# Patient Record
Sex: Female | Born: 1945 | Race: Black or African American | Hispanic: No | Smoking: Former smoker
Health system: Southern US, Community
[De-identification: ages and names within clinical notes are randomized; demographics above are authoritative.]

## PROBLEM LIST (undated history)

## (undated) ENCOUNTER — Emergency Department (HOSPITAL_COMMUNITY): Admission: EM | Disposition: A | Discharge status: Other Acute Inpatient Hospital | Payer: Medicare Other

## (undated) DIAGNOSIS — I10 Essential (primary) hypertension: Secondary | ICD-10-CM

## (undated) DIAGNOSIS — K219 Gastro-esophageal reflux disease without esophagitis: Secondary | ICD-10-CM

## (undated) DIAGNOSIS — K449 Diaphragmatic hernia without obstruction or gangrene: Secondary | ICD-10-CM

## (undated) DIAGNOSIS — N186 End stage renal disease: Secondary | ICD-10-CM

## (undated) DIAGNOSIS — G8929 Other chronic pain: Secondary | ICD-10-CM

## (undated) DIAGNOSIS — J189 Pneumonia, unspecified organism: Secondary | ICD-10-CM

## (undated) DIAGNOSIS — M545 Low back pain, unspecified: Secondary | ICD-10-CM

## (undated) DIAGNOSIS — I2510 Atherosclerotic heart disease of native coronary artery without angina pectoris: Secondary | ICD-10-CM

## (undated) DIAGNOSIS — I4819 Other persistent atrial fibrillation: Secondary | ICD-10-CM

## (undated) DIAGNOSIS — E119 Type 2 diabetes mellitus without complications: Secondary | ICD-10-CM

## (undated) DIAGNOSIS — Z992 Dependence on renal dialysis: Secondary | ICD-10-CM

## (undated) DIAGNOSIS — K5790 Diverticulosis of intestine, part unspecified, without perforation or abscess without bleeding: Secondary | ICD-10-CM

## (undated) DIAGNOSIS — M1990 Unspecified osteoarthritis, unspecified site: Secondary | ICD-10-CM

## (undated) DIAGNOSIS — Z9289 Personal history of other medical treatment: Secondary | ICD-10-CM

## (undated) DIAGNOSIS — E669 Obesity, unspecified: Secondary | ICD-10-CM

## (undated) DIAGNOSIS — E785 Hyperlipidemia, unspecified: Secondary | ICD-10-CM

## (undated) DIAGNOSIS — N289 Disorder of kidney and ureter, unspecified: Secondary | ICD-10-CM

## (undated) HISTORY — PX: AV FISTULA REPAIR: SHX563

## (undated) HISTORY — PX: ABDOMINAL HYSTERECTOMY: SHX81

## (undated) HISTORY — DX: Atherosclerotic heart disease of native coronary artery without angina pectoris: I25.10

## (undated) HISTORY — DX: Morbid (severe) obesity due to excess calories: E66.01

## (undated) HISTORY — PX: COLONOSCOPY W/ BIOPSIES AND POLYPECTOMY: SHX1376

## (undated) HISTORY — PX: ESOPHAGOGASTRODUODENOSCOPY: SHX1529

## (undated) HISTORY — DX: Diaphragmatic hernia without obstruction or gangrene: K44.9

## (undated) HISTORY — PX: CATARACT EXTRACTION W/ INTRAOCULAR LENS IMPLANT: SHX1309

## (undated) HISTORY — PX: CARDIAC CATHETERIZATION: SHX172

## (undated) HISTORY — DX: Diverticulosis of intestine, part unspecified, without perforation or abscess without bleeding: K57.90

---

## 1998-03-10 ENCOUNTER — Other Ambulatory Visit: Admission: RE | Admit: 1998-03-10 | Discharge: 1998-03-10 | Payer: Self-pay | Admitting: *Deleted

## 1998-06-23 ENCOUNTER — Other Ambulatory Visit: Admission: RE | Admit: 1998-06-23 | Discharge: 1998-06-23 | Payer: Self-pay | Admitting: Internal Medicine

## 1999-03-18 ENCOUNTER — Encounter: Payer: Self-pay | Admitting: Emergency Medicine

## 1999-03-18 ENCOUNTER — Emergency Department (HOSPITAL_COMMUNITY): Admission: EM | Admit: 1999-03-18 | Discharge: 1999-03-18 | Payer: Self-pay | Admitting: Emergency Medicine

## 1999-06-08 ENCOUNTER — Ambulatory Visit (HOSPITAL_COMMUNITY): Admission: RE | Admit: 1999-06-08 | Discharge: 1999-06-08 | Payer: Self-pay | Admitting: Internal Medicine

## 1999-06-08 ENCOUNTER — Encounter: Payer: Self-pay | Admitting: Internal Medicine

## 1999-06-12 ENCOUNTER — Emergency Department (HOSPITAL_COMMUNITY): Admission: EM | Admit: 1999-06-12 | Discharge: 1999-06-12 | Payer: Self-pay | Admitting: Emergency Medicine

## 2000-03-13 ENCOUNTER — Emergency Department (HOSPITAL_COMMUNITY): Admission: EM | Admit: 2000-03-13 | Discharge: 2000-03-13 | Payer: Self-pay | Admitting: Emergency Medicine

## 2000-03-13 ENCOUNTER — Encounter: Payer: Self-pay | Admitting: Emergency Medicine

## 2000-06-23 ENCOUNTER — Ambulatory Visit (HOSPITAL_COMMUNITY): Admission: RE | Admit: 2000-06-23 | Discharge: 2000-06-23 | Payer: Self-pay | Admitting: Internal Medicine

## 2000-06-23 ENCOUNTER — Encounter: Payer: Self-pay | Admitting: Internal Medicine

## 2000-08-03 ENCOUNTER — Emergency Department (HOSPITAL_COMMUNITY): Admission: EM | Admit: 2000-08-03 | Discharge: 2000-08-03 | Payer: Self-pay | Admitting: Emergency Medicine

## 2000-08-03 ENCOUNTER — Encounter: Payer: Self-pay | Admitting: Emergency Medicine

## 2000-08-07 ENCOUNTER — Encounter: Payer: Self-pay | Admitting: Internal Medicine

## 2000-08-07 ENCOUNTER — Ambulatory Visit (HOSPITAL_COMMUNITY): Admission: RE | Admit: 2000-08-07 | Discharge: 2000-08-07 | Payer: Self-pay | Admitting: Internal Medicine

## 2000-08-13 ENCOUNTER — Encounter: Payer: Self-pay | Admitting: Emergency Medicine

## 2000-08-13 ENCOUNTER — Emergency Department (HOSPITAL_COMMUNITY): Admission: EM | Admit: 2000-08-13 | Discharge: 2000-08-13 | Payer: Self-pay | Admitting: Emergency Medicine

## 2000-08-18 ENCOUNTER — Inpatient Hospital Stay (HOSPITAL_COMMUNITY): Admission: AD | Admit: 2000-08-18 | Discharge: 2000-08-20 | Payer: Self-pay | Admitting: Internal Medicine

## 2001-07-14 ENCOUNTER — Emergency Department (HOSPITAL_COMMUNITY): Admission: EM | Admit: 2001-07-14 | Discharge: 2001-07-14 | Payer: Self-pay | Admitting: *Deleted

## 2001-07-14 ENCOUNTER — Encounter: Payer: Self-pay | Admitting: *Deleted

## 2002-04-15 ENCOUNTER — Emergency Department (HOSPITAL_COMMUNITY): Admission: EM | Admit: 2002-04-15 | Discharge: 2002-04-15 | Payer: Self-pay | Admitting: Emergency Medicine

## 2002-06-16 ENCOUNTER — Encounter: Payer: Self-pay | Admitting: Internal Medicine

## 2002-06-16 ENCOUNTER — Ambulatory Visit (HOSPITAL_COMMUNITY): Admission: RE | Admit: 2002-06-16 | Discharge: 2002-06-16 | Payer: Self-pay | Admitting: Internal Medicine

## 2002-08-27 ENCOUNTER — Encounter (INDEPENDENT_AMBULATORY_CARE_PROVIDER_SITE_OTHER): Payer: Self-pay | Admitting: Specialist

## 2002-08-27 ENCOUNTER — Ambulatory Visit (HOSPITAL_COMMUNITY): Admission: RE | Admit: 2002-08-27 | Discharge: 2002-08-27 | Payer: Self-pay | Admitting: Gastroenterology

## 2003-05-08 ENCOUNTER — Emergency Department (HOSPITAL_COMMUNITY): Admission: EM | Admit: 2003-05-08 | Discharge: 2003-05-08 | Payer: Self-pay | Admitting: Emergency Medicine

## 2003-06-20 ENCOUNTER — Encounter: Payer: Self-pay | Admitting: Internal Medicine

## 2003-06-20 ENCOUNTER — Ambulatory Visit (HOSPITAL_COMMUNITY): Admission: RE | Admit: 2003-06-20 | Discharge: 2003-06-20 | Payer: Self-pay | Admitting: Internal Medicine

## 2005-05-02 ENCOUNTER — Ambulatory Visit (HOSPITAL_COMMUNITY): Admission: RE | Admit: 2005-05-02 | Discharge: 2005-05-02 | Payer: Self-pay | Admitting: Internal Medicine

## 2005-06-01 ENCOUNTER — Emergency Department (HOSPITAL_COMMUNITY): Admission: EM | Admit: 2005-06-01 | Discharge: 2005-06-01 | Payer: Self-pay | Admitting: Emergency Medicine

## 2006-04-13 ENCOUNTER — Emergency Department (HOSPITAL_COMMUNITY): Admission: EM | Admit: 2006-04-13 | Discharge: 2006-04-13 | Payer: Self-pay | Admitting: Emergency Medicine

## 2006-05-05 ENCOUNTER — Ambulatory Visit (HOSPITAL_COMMUNITY): Admission: RE | Admit: 2006-05-05 | Discharge: 2006-05-05 | Payer: Self-pay | Admitting: Internal Medicine

## 2006-05-08 ENCOUNTER — Ambulatory Visit (HOSPITAL_COMMUNITY): Admission: RE | Admit: 2006-05-08 | Discharge: 2006-05-09 | Payer: Self-pay | Admitting: Ophthalmology

## 2007-05-20 ENCOUNTER — Ambulatory Visit (HOSPITAL_COMMUNITY): Admission: RE | Admit: 2007-05-20 | Discharge: 2007-05-20 | Payer: Self-pay | Admitting: Internal Medicine

## 2007-10-07 ENCOUNTER — Emergency Department (HOSPITAL_COMMUNITY): Admission: EM | Admit: 2007-10-07 | Discharge: 2007-10-07 | Payer: Self-pay | Admitting: Emergency Medicine

## 2007-11-03 ENCOUNTER — Emergency Department (HOSPITAL_COMMUNITY): Admission: EM | Admit: 2007-11-03 | Discharge: 2007-11-03 | Payer: Self-pay | Admitting: Internal Medicine

## 2008-02-17 ENCOUNTER — Emergency Department (HOSPITAL_COMMUNITY): Admission: EM | Admit: 2008-02-17 | Discharge: 2008-02-18 | Payer: Self-pay | Admitting: Emergency Medicine

## 2008-05-03 ENCOUNTER — Ambulatory Visit (HOSPITAL_COMMUNITY): Admission: RE | Admit: 2008-05-03 | Discharge: 2008-05-04 | Payer: Self-pay | Admitting: Ophthalmology

## 2008-05-24 ENCOUNTER — Ambulatory Visit (HOSPITAL_COMMUNITY): Admission: RE | Admit: 2008-05-24 | Discharge: 2008-05-24 | Payer: Self-pay | Admitting: Internal Medicine

## 2009-05-24 ENCOUNTER — Emergency Department (HOSPITAL_COMMUNITY): Admission: EM | Admit: 2009-05-24 | Discharge: 2009-05-24 | Payer: Self-pay | Admitting: Emergency Medicine

## 2009-05-26 ENCOUNTER — Emergency Department (HOSPITAL_COMMUNITY): Admission: EM | Admit: 2009-05-26 | Discharge: 2009-05-26 | Payer: Self-pay | Admitting: Emergency Medicine

## 2011-03-11 LAB — POCT CARDIAC MARKERS
CKMB, poc: <1 ng/mL — ABNORMAL LOW (ref 1.0–8.0)
Myoglobin, poc: 166 ng/mL (ref 12–200)
Troponin i, poc: <0.05 ng/mL (ref 0.00–0.09)

## 2011-03-11 LAB — POCT I-STAT, CHEM 8
BUN: 29 mg/dL — ABNORMAL HIGH (ref 6–23)
Chloride: 104 mEq/L (ref 96–112)
Potassium: 4.4 mEq/L (ref 3.5–5.1)

## 2011-04-16 NOTE — Op Note (Signed)
[name], Jacqueline             ACCOUNT NO.:  [id number]   MEDICAL RECORD NO.:  [id number]          PATIENT TYPE:  OIB   LOCATION:  [id number]                         FACILITY:  [facility]   PHYSICIAN:  Chrystie Nose. Zigmund Daniel, M.D. DATE OF BIRTH:  1946/05/05   DATE OF PROCEDURE:  05/03/2008  DATE OF DISCHARGE:                               OPERATIVE REPORT   ADMISSION DIAGNOSES:  1. Retained lens material in the left eye.  2. Aphakia, left eye.   PROCEDURES:  Pars plana vitrectomy, phacofragmentation of nuclear  material and vitreous, placement of secondary intraocular lens with  suture, and retinal photocoagulation, left eye.   SURGEON:  Chrystie Nose. Zigmund Daniel, MD   ASSISTANT:  Deatra Ina, MA   ANESTHESIA:  General.   PROCEDURE IN DETAIL:  Usual prep and drape, peritomy from 8 o'clock to 4  o'clock, half-thickness scleral flaps were raised at 3 o'clock and 9  o'clock in anticipation of IOL suture.  A 19-gauge wound was placed at 2  o'clock for phacofragmentation device.  The 25-gauge trocars were placed  at 10, 2, and 4 o'clock.  A 3-layered corneoscleral wound was created  between 10 o'clock and 2 o'clock.  Provisc was placed on the corneal  surface.  Pars plana vitrectomy was carried out to remove vitreous and  lens fragments from the vitreous cavity.  A large dark yellow nucleus  was encountered.  The phacofragmentation device was placed through the  scleral wound and phacofragmentation was performed with the lighted pick  and the phacofragmentation device.  The entire nucleus was removed.  All  nuclear material was removed.  Additional vitrectomy was carried out  removing vitreous and the pigmented debris from the vitreous.  Some  light clear shiny particles were removed from the retinal surface.  Mini  shiny particles were attached to the retinal surface and were not able  to be removed.  The silicone tip suction line was also used to try to  remove these particles.  Two 10-0 Prolene  sutures were placed beneath  the flaps behind the iris through the ciliary sulcus from 3 o'clock to 9  o'clock.  The sutures were externalized through the corneoscleral wound.  A new intraocular lens model [id number] made [place], length 12.5 MM, optic 7.0 MM, serial number [id number],  expiration date 2012-05 was brought onto the field.  This lens was  inspected and cleaned.  Sutures were attached to the eyelets of the  lens.  The lens was passed into the posterior chamber and dialed into  place.  These scleral sutures were knotted externally beneath the  scleral flaps.  The corneal wound was closed with 4 interrupted 10-0  nylon sutures.  The Fragmatome wound was closed with 9-0 nylon.  Additional vitrectomy was performed removing pigmented particles from  the retinal surface.  Once the entire vitreous cavity was clear, the 25-  gauge trocars were removed from the eye.  The wounds were tested and  found to be tight.  The conjunctiva was closed with 7-0 chromic suture.  Polymyxin and gentamicin were irrigated into the Tenon  space.  Marcaine was injected around the globe for postop pain.  Decadron 10 mg  was injected into the lower subconjunctival space.  The closing pressure  was 10 with a Barraquer tonometer.  TobraDex ophthalmic ointment and  patch and shield were placed.  The patient was awakened and taken to  recovery in satisfactory condition.      Chrystie Nose. Zigmund Daniel, M.D.  Electronically Signed     JDM/MEDQ  D:  05/03/2008  T:  05/04/2008  Job:  [id number]

## 2011-04-19 NOTE — Op Note (Signed)
NAME:  Jacqueline [name], Jacqueline [name]                       ACCOUNT NO.:  [id number]   MEDICAL RECORD NO.:  [id number]                   PATIENT TYPE:  AMB   LOCATION:  ENDO                                 FACILITY:  [facility]   PHYSICIAN:  Juanita Craver, M.D.                   DATE OF BIRTH:  1946/05/03   DATE OF PROCEDURE:  08/27/2002  DATE OF DISCHARGE:                                 OPERATIVE REPORT   PROCEDURE:  Colonoscopy with snare polypectomy x 1.   ENDOSCOPIST:  Juanita Craver, M.D.   INSTRUMENT USED:  Olympus video colonoscope.   INDICATIONS FOR PROCEDURE:  A 65 year old African-American female underwent  colonoscopy to rule out colonic polyps, massive hemorrhoids.  The patient  has a history of anemia with hemoglobin down to 10.1.   PREPROCEDURE PREPARATION:  Informed consent was procured from the patient.  The patient was fasted for eight hours prior to the procedure and prepped  with a bottle of MiraLax and 64 ounces of GoLYTELY the night prior to the  procedure.   PREPROCEDURE PHYSICAL:  VITAL SIGNS:  The patient had stable vital signs.  High blood pressure at 225/108.  NECK:  Supple.  CHEST:  Clear to auscultation.  CARDIAC:  S1 and S2 regular.  ABDOMEN:  Soft with normal bowel sounds.   Ten mg of labetalol was given to the patient prior to procedure to help with  her blood pressure.   DESCRIPTION OF PROCEDURE:  The patient was placed in the left lateral  decubitus position and sedated with and additional 10 mg of Demerol and 1 mg  of Versed intravenously.  Once the patient was adequately sedated,  maintained on low flow oxygen and continuous cardiac monitoring, the Olympus  video colonoscope was advanced through the rectum to the cecum with extreme  difficulty.  There was large amount of solid stool and patchy areas  throughout the colon.  Multiple washes were done.  The appendiceal orifice  and ileocecal valve were visualized and photographed.  Small lesions could  have  been missed.  No large masses or polyps were present.  Small sessile  polyp was snared from 100 cm in the transverse colon.  There was scattered  diverticulosis throughout the colon with inspissated stool in several  diverticular pockets.   IMPRESSION:  1. Scattered diverticulosis.  2. Small sessile polyp snared at 100 cm.  3. Large amount of residual stool in the colon.  Small lesions could have     been missed.   RECOMMENDATIONS:  1. Await pathology results.  2. Avoid all nonsteroidals including aspirin.  3.     Follow up with Dr. Willey Blade within the next two to three weeks for     discussion of antihypertensive treatment regimen.  4. Outpatient in the next two weeks.  Juanita Craver, M.D.    JM/MEDQ  D:  08/27/2002  T:  08/29/2002  Job:  [id number]   cc:   Royetta Crochet. Karlton Lemon, M.D.

## 2011-04-19 NOTE — H&P (Signed)
. [facility]  Patient:    Jacqueline [name], Jacqueline [name]                           MRN: [id number] Adm. Date:  [id number] Disc. Date: [id number] Attending:  Salena Saner.                         History and Physical  CHIEF COMPLAINT: Chest pain.  HISTORY OF PRESENT ILLNESS: Jacqueline [name] is a 65 year old lady who presents with a one day history of mid sternal chest pain.  She states the pain occurs spontaneously and is not relieved with rest.  She does not report any association of her pain with activity or food.  She denies radiation of pain into her extremities, diaphoresis, nausea, vomiting, or palpitations.  She also has been treated quite aggressively as an outpatient for labile hypertension.  Her blood pressures have been running at systolic pressures in the 200s.  She has no other acute constitutional or systemic complaints at this time.  ALLERGIES: No known drug allergies.  MEDICATIONS:  1. Glucotrol XL 10 mg 1 p.o. b.i.d.  2. Glucophage 500 mg 1 p.o. b.i.d.  3. Lotrel 5/20 1 p.o. q.d.  4. Labetalol 100 mg 1 p.o. b.i.d.  5. Demadex 20 mg 1 p.o. q.d.  6. Protonix 40 mg 1 p.o. q.d.  7. Aspirin 325 mg 1 p.o. q.d.  PAST MEDICAL HISTORY:  1. Type 2 diabetes mellitus.  2. Hypertension.  3. Obesity.  SOCIAL HISTORY: Jacqueline [name] is married and is employed as a Pharmacist, hospital.  She denies tobacco, alcohol, or drug abuse.  FAMILY HISTORY: Significant for diabetes and hypertension in siblings and heart disease in siblings as well.  REVIEW OF SYSTEMS: As per HPI, and otherwise negative systems reviewed.  PHYSICAL EXAMINATION:  GENERAL: The patient is a well-developed, well-nourished female in no acute distress.  VITAL SIGNS: Temperature 98.5 degrees, pulse 79, respirations 20, blood pressure 165/74.  HEENT: Within normal limits.  PERRLA.  EOMI.  Normal funduscopic examination.  NECK: Supple.  No masses.  Carotids 2+.  No bruits.  CHEST: Lungs clear to  auscultation bilaterally.  Normal breath sounds.  HEART: S1 and S2.  Regular rate and rhythm.  No murmurs, rubs, or gallops.  ABDOMEN: Soft, nontender, nondistended.  Positive bowel sounds.  EXTREMITIES: No clubbing, cyanosis, or edema.  Pulses 2+ throughout.  NEUROLOGIC: Alert and oriented x 3.  Cranial nerves intact.  ASSESSMENT/PLAN:  1. Atypical chest pain.  Cardiac etiology will need to be ruled out.  The     patient will be admitted to telemetry and serial CK-MBs and troponin I     isoenzymes will be obtained.  She had a 2D echocardiogram done as an     outpatient two weeks ago which showed normal left ventricular function.     If she rules in she will obviously need cardiology consultation during     this admission.  2. Hypertension.  This has been poorly controlled with multiple drug     regimen as an outpatient and renal artery MRA and limited abdominal     MRI revealed no renal artery stenosis or renal artery disease which     may be contributing to her labile hypertension.  MRI shows no adrenal     masses.  Most likely she has labile essential hypertension.  Further     adjustments in her medications will  be made during this admission.  3. Type 2 diabetes mellitus.  This has been well controlled with her     current medications.DD:  09/03/00 TD:  09/04/00 Job: [id number]

## 2011-04-19 NOTE — Discharge Summary (Signed)
[facility]  Patient:    Jacqueline [name], Jacqueline [name]                           MRN: [id number] Adm. Date:  [id number] Disc. Date: [id number] Attending:  Salena Saner.                           Discharge Summary  DISCHARGE DIAGNOSES: 1. Atypical chest pain. 2. Urgent hypertension. 3. Generalized anxiety. 4. Type 2 diabetes mellitus. 5. Anemia. 6. Hyperlipidemia.  HISTORY OF PRESENT ILLNESS:  [name] was admitted with symptoms of midsternal chest pain and burning.  She stated that her symptoms were especially worse at night when she was awakened from sleep.  She denied any radiation of pain, nausea, vomiting, or diaphoresis, however, she did have palpitations associated with the symptoms.  She was admitted for cardiac evaluation for chest pain.  HOSPITAL COURSE: #1 - ATYPICAL CHEST PAIN:  Her cardiac enzymes, including troponins and CK-MB assays were negative.  Telemetry was unremarkable, showing sinus rhythm with occasional PVCs.  She had no further complaints of chest pain during the course of her admission, however, she was placed on Protonix for possible GERD/acid reflux.  She was also given Xanax for anxiety, which she stated did alleviate her nocturnal symptoms.  A 2-D echocardiogram was done as an outpatient about six weeks ago, which was unremarkable and it is unlikely that any significant myocardial lesion or cardiac ischemia is contributing to her current symptoms.  I suspect that the chest pain most likely has a GI etiology exacerbated by her anxiety.  #2 - URGENT HYPERTENSION:  [name] has had labile hypertension over the past several months.  Several changes in her antihypertensive regimen have been done as an outpatient with little to no success of effectively lowering her blood pressure.  A renal artery MRA and a limited abdominal MRI were done in July of 2001, which was unremarkable.  I suspect that Jacqueline [name] has labile essential  hypertension.  I will continue readjustment of her medications as an outpatient.  Her kidney function has been within normal limits.  #3 - ANEMIA:  Iron studies were done during this admission and the results are pending at the time of discharge.  [name] has had a colonoscopy within the past three years, which was unremarkable.  Further anemia work-up will be pursued as an outpatient pending results.  #4 - HYPERLIPIDEMIA:  Jacqueline [name] had a total cholesterol of 256 with LDL cholesterol 181.  She will require cholesterol-lowering medications, which will be started as an outpatient.  #5 - TYPE 2 DIABETES MELLITUS:  This problem was well controlled during this admission.  CONDITION ON DISCHARGE:  [name] was chest pain-Jacqueline [name].  She denied shortness of breath.  She was ambulating and eating without complications.  DISCHARGE MEDICATIONS: 1. Protonix 40 mg one p.o. q.d. 2. Enteric-coated aspirin 325 mg one p.o. q.d. 3. Glucotrol XL 10 mg one p.o. b.i.d. 4. Glucophage 500 mg one p.o. b.i.d. 5. Lotensin 20 mg one p.o. q.d. 6. Lotrel 5/21 one p.o. q.d. 7. Demodex 20 mg one p.o. q.d. 8. Labetalol 200 mg one p.o. b.i.d. 9. Xanax 0.25 mg one p.o. t.i.d. p.r.n.  FOLLOW-UP:  [name] will be seen by Royetta Crochet. Karlton Lemon, M.D., on Tuesday, August 29, 2000, at 11 a.m. DD:  08/20/00 TD:  08/22/00 Job: [id number]

## 2011-04-19 NOTE — Op Note (Signed)
Jacqueline [name], Jacqueline [name]             ACCOUNT NO.:  [id number]   MEDICAL RECORD NO.:  [id number]            PATIENT TYPE:   LOCATION:                                 FACILITY:   PHYSICIAN:  John D. Zigmund Daniel, M.D.      DATE OF BIRTH:   DATE OF PROCEDURE:  05/08/2006  DATE OF DISCHARGE:                                 OPERATIVE REPORT   ADMISSION DIAGNOSIS:  1.  Proliferative diabetic retinopathy.  2.  Posterior membranes vitreous hemorrhage left eye.   PROCEDURES:  1.  Pars plana vitrectomy with a 25-gauge system.  2.  Retinal photocoagulation.  3.  Membrane peel.  4.  Gas injection left eye.   SURGEON:  Chrystie Nose. Zigmund Daniel, M.D.   ASSISTANT:  Deatra Ina, MA   ANESTHESIA:  General.   DETAILS:  Usual prep and drape.  Trocar insertions at 10, 2, and 4 o'clock.  A 5-mm infusion port at 4 o'clock.  The lighted pick and the cutter were  placed at 10 and 2 o'clock respectively.  Provisc on the corneal surface.  Biome viewing system moved into place.  Pars plana vitrectomy begun just  behind the cataractous lens.  The vitreous was encountered with blood and  membranes.  There was a pocket of blood and fluid covering the macular  region.  The pocket was engaged with the lighted pick and peeled across the  macular region.  The blood was then removed from this area.  The vitrectomy  was carried out into the periphery where additional vitreoretinal adhesions  were seen and peeled.   Once all of the vitreous was removed in the core fashion, the vitrectomy was  carried to the periphery where the vitreous base was trimmed for 360  degrees.  The endolaser was positioned in the eye, 1083 burns were placed  around the retinal periphery with the power of 1000 milliwatts, 1000 microns  each, and 0.05 seconds each.  A washout procedure was then performed.  A 30%  gas fluid exchange was performed.  The instruments were removed from the  eye.  The trocars were removed from the eye.  The wounds were  tested and  found to be tight.  The closing pressure was 15 with a Baer keratometer.  Polymyxin and gentamicin were irrigated into Tenon's space.  Atropine  solution was applied, Decadron 10 mg was injected into the lower  subconjunctival space.  Marcaine was injected around the globe for postop  pain.  TobraDex ophthalmic ointment, a patch, and shield were placed.  The  patient was awakened, and taken to recovery in satisfactory condition.  Operative time 1/2 hour.      Chrystie Nose. Zigmund Daniel, M.D.  Electronically Signed     JDM/MEDQ  D:  05/08/2006  T:  05/09/2006  Job:  [id number]

## 2011-04-19 NOTE — Op Note (Signed)
NAME:  Jacqueline [name], Jacqueline [name]                       ACCOUNT NO.:  [id number]   MEDICAL RECORD NO.:  [id number]                   PATIENT TYPE:  AMB   LOCATION:  ENDO                                 FACILITY:  [facility]   PHYSICIAN:  Juanita Craver, M.D.                   DATE OF BIRTH:  January 17, 1946   DATE OF PROCEDURE:  08/27/2002  DATE OF DISCHARGE:  08/27/2002                                 OPERATIVE REPORT   PROCEDURE PERFORMED:  Esophagogastroduodenoscopy with biopsies.   ENDOSCOPIST:  Juanita Craver, M.D.   INSTRUMENT USED:  Olympus video panendoscope.   INDICATIONS FOR PROCEDURE:  [age]-year-old African American female  undergoing EGD for anemia.  The patient also had some abdominal discomfort.  Rule out peptic ulcer disease, esophagitis, gastritis, etc.   PROCEDURE PREPARATION:  Informed consent was procured from the patient.  The  patient was fasted for eight hours prior to the procedure.   PREPROCEDURE PHYSICAL EXAMINATION:  VITAL SIGNS:  The patient has stable  vital signs except for blood pressure of 225/108.  NECK:  Supple.  CHEST:  Clear to auscultation.  HEART:  S1 and S2 regular.  ABDOMEN:  Soft with normal bowel sounds.   DESCRIPTION OF PROCEDURE:  The patient was placed in the left lateral  decubitus position and sedated with 60 mg of Demerol and 46 mg of Versed  intravenously.  Once the patient was adequately sedated and maintained on  low flow oxygen and continuous cardiac monitoring, the Olympus video  panendoscope was advanced through the mouthpiece, over the tongue, into the  esophagus under direct vision.  The entire esophagus appeared normal with no  evidence of ring, stricture, masses, esophagitis or Barrett's mucosa.  The  scope was then advanced into the stomach.  There was diffuse gastritis  throughout the gastric mucosa with areas of patchy hemorrhage.  Old heme was  seen overlying these areas.  Biopsies were taken to rule out the evidence of  Helicobacter pylori for pathology.  A small hiatal hernia was seen on high  retroflexion.  No frank ulcers, masses, or polyps were present.  The  proximal small bowel appeared normal.   IMPRESSION:  1. Normal appearing esophagus and proximal small bowel.  2. Diffuse gastritis with patchy areas of hemorrhage.  Biopsies done,     results pending.   RECOMMENDATIONS:  1. Await pathology results.  2. Avoid all nonsteroidals.  3.     Continue PPIs.  4. Proceed with colonoscopy at this time.  5. Further recommendations on an outpatient basis in the next two weeks.                                               Juanita Craver, M.D.    JM/MEDQ  D:  08/27/2002  T:  08/29/2002  Job:  [id number]   cc:   Royetta Crochet. Karlton Lemon, M.D.

## 2011-08-26 LAB — I-STAT 8, (EC8 V) (CONVERTED LAB)
Bicarbonate: 23.1
Chloride: 106
Glucose, Bld: 348 — ABNORMAL HIGH
HCT: 34 — ABNORMAL LOW
Potassium: 3.9
TCO2: 24
pCO2, Ven: 42.5 — ABNORMAL LOW
pH, Ven: 7.344 — ABNORMAL HIGH

## 2011-08-26 LAB — DIFFERENTIAL
Basophils Relative: 0
Eosinophils Absolute: 0.2
Lymphs Abs: 2.7
Monocytes Absolute: 1
Neutrophils Relative %: 65

## 2011-08-26 LAB — POCT I-STAT CREATININE
Creatinine, Ser: 1.8 — ABNORMAL HIGH
Operator id: 270651

## 2011-08-26 LAB — URINE MICROSCOPIC-ADD ON

## 2011-08-26 LAB — CBC
MCHC: 34.1
MCV: 83.7
Platelets: 231
WBC: 11 — ABNORMAL HIGH

## 2011-08-26 LAB — POTASSIUM: Potassium: 4.1

## 2011-08-26 LAB — URINALYSIS, ROUTINE W REFLEX MICROSCOPIC
Bilirubin Urine: NEGATIVE
Glucose, UA: 100 — AB
Ketones, ur: NEGATIVE

## 2011-08-28 LAB — BASIC METABOLIC PANEL
CO2: 27
Chloride: 102
GFR calc non Af Amer: 31 — ABNORMAL LOW
Glucose, Bld: 352 — ABNORMAL HIGH
Sodium: 138

## 2011-08-28 LAB — CBC
HCT: 33.6 — ABNORMAL LOW
MCV: 84.1
RBC: 4
RDW: 15
WBC: 11.3 — ABNORMAL HIGH

## 2011-10-16 ENCOUNTER — Encounter: Payer: Self-pay | Admitting: *Deleted

## 2011-10-16 ENCOUNTER — Emergency Department (HOSPITAL_COMMUNITY)
Admission: EM | Admit: 2011-10-16 | Discharge: 2011-10-16 | Disposition: A | Discharge status: Home / Self Care | Payer: Medicare Other | Attending: Emergency Medicine | Admitting: Emergency Medicine

## 2011-10-16 ENCOUNTER — Emergency Department (HOSPITAL_COMMUNITY): Payer: Medicare Other

## 2011-10-16 ENCOUNTER — Other Ambulatory Visit: Payer: Self-pay

## 2011-10-16 DIAGNOSIS — Z79899 Other long term (current) drug therapy: Secondary | ICD-10-CM | POA: Insufficient documentation

## 2011-10-16 DIAGNOSIS — N289 Disorder of kidney and ureter, unspecified: Secondary | ICD-10-CM

## 2011-10-16 DIAGNOSIS — I10 Essential (primary) hypertension: Secondary | ICD-10-CM

## 2011-10-16 DIAGNOSIS — E119 Type 2 diabetes mellitus without complications: Secondary | ICD-10-CM | POA: Insufficient documentation

## 2011-10-16 HISTORY — DX: Essential (primary) hypertension: I10

## 2011-10-16 LAB — POCT I-STAT, CHEM 8
BUN: 42 mg/dL — ABNORMAL HIGH (ref 6–23)
Calcium, Ion: 1.12 mmol/L (ref 1.12–1.32)
Creatinine, Ser: 2.2 mg/dL — ABNORMAL HIGH (ref 0.50–1.10)
Hemoglobin: 11.6 g/dL — ABNORMAL LOW (ref 12.0–15.0)
Sodium: 140 mEq/L (ref 135–145)
TCO2: 27 mmol/L (ref 0–100)

## 2011-10-16 NOTE — ED Notes (Signed)
PT. REPORTS ELEVATED BLOOD PRESSURE AT HOME THIS EVENING 262/127,  DENIES PAIN OR DISCOMFORT, NO NAUSEA, RESPIRATIONS UNLABORED .  STATES TAKING HER REGULAR MEDICATIONS.

## 2011-10-16 NOTE — ED Notes (Signed)
Pt reports that she has been extremely stressed out lately about a court appearance, deaf daughter and schizophrenia son.  Pt reports eating a couple of hamburgers and some chips tonight and then feeling 'funny in her head'.  Pt took her BP at home and found that it was elevated.  Pt took her nightly meds and then came to the ED for further eval.  Pt now denying pain, distress or 'funny head'.  Pt A/O x 4, BP 152/61.  Pt reassured.

## 2011-10-16 NOTE — ED Provider Notes (Signed)
History     CSN: [id number] Arrival date & time: 10/16/2011  7:24 PM   First MD Initiated Contact with Patient 10/16/11 2040      Chief Complaint  Patient presents with  . Hypertension    (Consider location/radiation/quality/duration/timing/severity/associated sxs/prior treatment) HPI  Patient states she felt a little "funny in head" and took bp and was elevated at 262/127.  Patient states she was under stress and had eaten hamburgers and chips.  Patient states she is scheduled for court next week.  Taking azor in am, prazosin at night (took before coming to hospital, but out of demadex.  PMD is Dr. Karlton Lemon.  No chest pain or sob, head symptoms have resolved and patient states she feels "normal."  Past Medical History  Diagnosis Date  . Hypertension   . Diabetes mellitus     History reviewed. No pertinent past surgical history.  No family history on file.  History  Substance Use Topics  . Smoking status: Never Smoker   . Smokeless tobacco: Not on file  . Alcohol Use: No    OB History    Grav Para Term Preterm Abortions TAB SAB Ect Mult Living                  Review of Systems  All other systems reviewed and are negative.    Allergies  Sulfa drugs cross reactors  Home Medications   Current Outpatient Rx  Name Route Sig Dispense Refill  . AMLODIPINE-OLMESARTAN 5-40 MG PO TABS Oral Take 1 tablet by mouth daily.      . OMEGA-3 FATTY ACIDS 1000 MG PO CAPS Oral Take 1 g by mouth daily.      Marland Kitchen GARLIC AB-[id number] MG PO CAPS Oral Take 2 capsules by mouth daily.      Marland Kitchen GLIPIZIDE 10 MG PO TABS Oral Take 10 mg by mouth 2 (two) times daily before a meal.      . NEBIVOLOL HCL 10 MG PO TABS Oral Take 10 mg by mouth daily.      Marland Kitchen POLYSACCHARIDE IRON 150 MG PO CAPS Oral Take 150 mg by mouth daily.      Marland Kitchen POTASSIUM CHLORIDE 20 MEQ PO PACK Oral Take 20 mEq by mouth daily.      Marland Kitchen PRAVASTATIN SODIUM 20 MG PO TABS Oral Take 20 mg by mouth at bedtime.      Marland Kitchen PRAZOSIN HCL 5 MG PO CAPS  Oral Take 10 mg by mouth at bedtime.      . TORSEMIDE 10 MG PO TABS Oral Take 10 mg by mouth daily.        BP 152/61  Pulse 78  Temp(Src) 97.6 F (36.4 C) (Oral)  Resp 22  SpO2 100%  Physical Exam  Vitals reviewed. Constitutional: She is oriented to person, place, and time. She appears well-developed and well-nourished.       Morbidly obese  HENT:  Head: Normocephalic and atraumatic.  Eyes: Conjunctivae are normal. Pupils are equal, round, and reactive to light.  Neck: Normal range of motion. Neck supple.  Cardiovascular: Normal rate.   Pulmonary/Chest: Effort normal and breath sounds normal.  Abdominal: Soft. Bowel sounds are normal.  Musculoskeletal: Normal range of motion.  Neurological: She is alert and oriented to person, place, and time. She has normal reflexes.  Skin: Skin is warm and dry.  Psychiatric: She has a normal mood and affect. Her behavior is normal. Judgment and thought content normal.    ED Course  Procedures (including  critical care time)  Labs Reviewed - No data to display No results found.   No diagnosis found.    MDM   Date: 10/16/2011  Rate: 55  Rhythm: normal sinus rhythm with frequent pvcs  QRS Axis: normal  Intervals: normal  ST/T Wave abnormalities: normal  Conduction Disutrbances:nonspecific intraventricular conduction delay  Narrative Interpretation:   Old EKG Reviewed: none available          Shaune Pollack, MD 10/16/11 2324

## 2012-01-06 ENCOUNTER — Observation Stay (HOSPITAL_COMMUNITY)
Admission: EM | Admit: 2012-01-06 | Discharge: 2012-01-07 | Disposition: A | Discharge status: Home / Self Care | Payer: MEDICARE | Attending: Emergency Medicine | Admitting: Emergency Medicine

## 2012-01-06 ENCOUNTER — Other Ambulatory Visit: Payer: Self-pay

## 2012-01-06 ENCOUNTER — Encounter (HOSPITAL_COMMUNITY): Payer: Self-pay | Admitting: *Deleted

## 2012-01-06 ENCOUNTER — Emergency Department (INDEPENDENT_AMBULATORY_CARE_PROVIDER_SITE_OTHER)
Admission: EM | Admit: 2012-01-06 | Discharge: 2012-01-06 | Disposition: A | Discharge status: Nursing Home (Includes ICF) | Payer: Medicare Other | Source: Home / Self Care | Attending: Family Medicine | Admitting: Family Medicine

## 2012-01-06 ENCOUNTER — Encounter (HOSPITAL_COMMUNITY): Payer: Self-pay | Admitting: Internal Medicine

## 2012-01-06 DIAGNOSIS — R609 Edema, unspecified: Secondary | ICD-10-CM | POA: Insufficient documentation

## 2012-01-06 DIAGNOSIS — E785 Hyperlipidemia, unspecified: Secondary | ICD-10-CM | POA: Insufficient documentation

## 2012-01-06 DIAGNOSIS — I10 Essential (primary) hypertension: Secondary | ICD-10-CM

## 2012-01-06 DIAGNOSIS — E119 Type 2 diabetes mellitus without complications: Secondary | ICD-10-CM | POA: Insufficient documentation

## 2012-01-06 DIAGNOSIS — N289 Disorder of kidney and ureter, unspecified: Secondary | ICD-10-CM | POA: Insufficient documentation

## 2012-01-06 DIAGNOSIS — M7989 Other specified soft tissue disorders: Principal | ICD-10-CM | POA: Insufficient documentation

## 2012-01-06 DIAGNOSIS — R600 Localized edema: Secondary | ICD-10-CM

## 2012-01-06 HISTORY — DX: Obesity, unspecified: E66.9

## 2012-01-06 HISTORY — DX: Hyperlipidemia, unspecified: E78.5

## 2012-01-06 LAB — BASIC METABOLIC PANEL
Calcium: 9.7 mg/dL (ref 8.4–10.5)
GFR calc Af Amer: 22 mL/min — ABNORMAL LOW (ref >90)
GFR calc non Af Amer: 19 mL/min — ABNORMAL LOW (ref >90)
Potassium: 4.2 mEq/L (ref 3.5–5.1)
Sodium: 141 mEq/L (ref 135–145)

## 2012-01-06 LAB — CBC
MCH: 28.5 pg (ref 26.0–34.0)
MCV: 89.4 fL (ref 78.0–100.0)
Platelets: 236 10*3/uL (ref 150–400)
RBC: 3.69 MIL/uL — ABNORMAL LOW (ref 3.87–5.11)
RDW: 15.2 % (ref 11.5–15.5)
WBC: 11.2 10*3/uL — ABNORMAL HIGH (ref 4.0–10.5)

## 2012-01-06 LAB — DIFFERENTIAL
Basophils Absolute: 0.1 10*3/uL (ref 0.0–0.1)
Eosinophils Absolute: 0.3 10*3/uL (ref 0.0–0.7)
Eosinophils Relative: 3 % (ref 0–5)
Lymphs Abs: 2.5 10*3/uL (ref 0.7–4.0)
Neutrophils Relative %: 67 % (ref 43–77)

## 2012-01-06 NOTE — ED Notes (Signed)
TRIAGE NOTES ENTERED BY Jules Schick RN .

## 2012-01-06 NOTE — ED Notes (Signed)
PT. REPORTS PROGRESSING LOWER LEGS SWELLING/EDEMA FOR SEVERAL DAYS , SEEN AT [facility] SENT HERE FOR FURTHER EVALUATION , DENIES SOB OR CHEST PAIN .

## 2012-01-06 NOTE — ED Provider Notes (Addendum)
History     CSN: [id number]  Arrival date & time 01/06/12  1940   First MD Initiated Contact with Patient 01/06/12 2032      Chief Complaint  Patient presents with  . Leg Swelling    (Consider location/radiation/quality/duration/timing/severity/associated sxs/prior treatment) Patient is a 66 y.o. female presenting with extremity weakness. The history is provided by the patient.  Extremity Weakness This is a new problem. The current episode started more than 1 week ago. The problem occurs constantly. The problem has not changed since onset.Pertinent negatives include no chest pain, no abdominal pain and no shortness of breath. Associated symptoms comments: Sx of lower leg swelling not responding to diuresis.pt wants to know if she has blood clots..    Past Medical History  Diagnosis Date  . Hypertension   . Diabetes mellitus   . Hyperlipidemia     History reviewed. No pertinent past surgical history.  No family history on file.  History  Substance Use Topics  . Smoking status: Never Smoker   . Smokeless tobacco: Not on file  . Alcohol Use: No    OB History    Grav Para Term Preterm Abortions TAB SAB Ect Mult Living                  Review of Systems  Constitutional: Negative.   Respiratory: Negative for shortness of breath.   Cardiovascular: Negative for chest pain.  Gastrointestinal: Negative.  Negative for abdominal pain.  Musculoskeletal: Positive for extremity weakness.    Allergies  Sulfa drugs cross reactors  Home Medications   Current Outpatient Rx  Name Route Sig Dispense Refill  . AMLODIPINE-OLMESARTAN 5-40 MG PO TABS Oral Take 1 tablet by mouth daily.      . OMEGA-3 FATTY ACIDS 1000 MG PO CAPS Oral Take 1 g by mouth daily.      Marland Kitchen GLIPIZIDE 10 MG PO TABS Oral Take 10 mg by mouth 2 (two) times daily before a meal.      . NEBIVOLOL HCL 10 MG PO TABS Oral Take 10 mg by mouth daily.      Marland Kitchen POTASSIUM CHLORIDE 20 MEQ PO PACK Oral Take 20 mEq by mouth  daily.      Marland Kitchen PRAVASTATIN SODIUM 20 MG PO TABS Oral Take 20 mg by mouth at bedtime.      Marland Kitchen PRAZOSIN HCL 5 MG PO CAPS Oral Take 10 mg by mouth at bedtime.      . TORSEMIDE 10 MG PO TABS Oral Take 10 mg by mouth daily.      Marland Kitchen GARLIC AB-[id number] MG PO CAPS Oral Take 2 capsules by mouth daily.      Marland Kitchen POLYSACCHARIDE IRON 150 MG PO CAPS Oral Take 150 mg by mouth daily.        BP 200/60  Pulse 82  Temp(Src) 98.1 F (36.7 C) (Oral)  Resp 20  SpO2 97%  Physical Exam  Nursing note and vitals reviewed. Constitutional: She is oriented to person, place, and time. She appears well-developed and well-nourished.  HENT:  Head: Normocephalic.  Eyes: Pupils are equal, round, and reactive to light.  Neck: Normal range of motion. Neck supple.  Cardiovascular: An irregular rhythm present. Exam reveals gallop, S3 and decreased pulses.   Pulmonary/Chest: Effort normal and breath sounds normal. She has no wheezes. She has no rales.  Musculoskeletal: She exhibits edema.  Neurological: She is alert and oriented to person, place, and time.  Skin: Skin is warm and dry.  Psychiatric:  She has a normal mood and affect.    ED Course  Procedures (including critical care time)  Labs Reviewed - No data to display No results found.   1. Edema of both legs       MDM          Pauline Good, MD 01/06/12 2104  Pauline Good, MD 01/07/12 1125

## 2012-01-06 NOTE — ED Notes (Signed)
C/O BLE swelling x 1 wk.  Saw PCP 1/31 & was told "it's just from my diabetes"; podiatrist recommended compression stockings - states unable to wear them due to cutting off circulation at knee.

## 2012-01-06 NOTE — ED Notes (Signed)
Pts CBG 97

## 2012-01-06 NOTE — ED Notes (Signed)
PT to ED c/o increasing bil LE edema x 1 month and intermittent L leg pain.  Pt was given compression stockings by her pcp in the beginning of Jan but was unable to wear them b/c they "hurt too much".    BIL LE 2+ pitting edema.  Clear lung sounds bil.  Pt denies sob or chest pain.  Pt states increased swelling and pain to L knee and L ankle.  Denies injury, but states, "I have a bad knee and ankle.  When my knee "pops" it feels better and I can walk".  Pt ambulates w/out cane/walker.

## 2012-01-07 ENCOUNTER — Other Ambulatory Visit (HOSPITAL_COMMUNITY): Payer: Medicare Other

## 2012-01-07 DIAGNOSIS — M7989 Other specified soft tissue disorders: Secondary | ICD-10-CM

## 2012-01-07 LAB — D-DIMER, QUANTITATIVE: D-Dimer, Quant: 1.35 ug/mL-FEU — ABNORMAL HIGH (ref 0.00–0.48)

## 2012-01-07 LAB — GLUCOSE, CAPILLARY

## 2012-01-07 MED ORDER — PRAZOSIN HCL 5 MG PO CAPS
5.0000 mg | ORAL_CAPSULE | ORAL | Status: DC
  Filled 2012-01-07: qty 1

## 2012-01-07 MED ORDER — ENOXAPARIN SODIUM 150 MG/ML ~~LOC~~ SOLN
135.0000 mg | SUBCUTANEOUS | Status: AC
  Administered 2012-01-07: 135 mg via SUBCUTANEOUS
  Filled 2012-01-07: qty 1

## 2012-01-07 MED ORDER — PRAZOSIN HCL 5 MG PO CAPS
10.0000 mg | ORAL_CAPSULE | ORAL | Status: AC
  Administered 2012-01-07: 10 mg via ORAL
  Filled 2012-01-07: qty 2

## 2012-01-07 MED ORDER — PRAZOSIN HCL 5 MG PO CAPS: 10.0000 mg | ORAL_CAPSULE | Freq: Every day | ORAL | Status: DC

## 2012-01-07 MED ORDER — SIMVASTATIN 10 MG PO TABS
10.0000 mg | ORAL_TABLET | ORAL | Status: AC
  Administered 2012-01-07: 10 mg via ORAL
  Filled 2012-01-07: qty 1

## 2012-01-07 MED ORDER — TORSEMIDE 10 MG PO TABS: 20.0000 mg | ORAL_TABLET | Freq: Two times a day (BID) | ORAL | Status: DC

## 2012-01-07 NOTE — Progress Notes (Signed)
Bilateral lower extremity venous duplex completed.  Preliminary report is negative for DVT and SVT.  There appears to be a Baker's cyst in the left pop fossa.

## 2012-01-07 NOTE — Progress Notes (Signed)
Observation review is complete. 

## 2012-01-07 NOTE — ED Provider Notes (Signed)
History     CSN: [id number]  Arrival date & time 01/06/12  2108   First MD Initiated Contact with Patient 01/06/12 2351      Chief Complaint  Patient presents with  . Leg Swelling    (Consider location/radiation/quality/duration/timing/severity/associated sxs/prior treatment) HPI Patient reports bilateral leg swelling gradual onset approximately 2 weeks ago she has pain in her legs when she keeps him in a dependent position. Pain is improved with elevation. Reports initial swelling was of left leg has moved to right leg. Presents because she's concerned about a blood clot in her legs. [facility] prior to coming here tonight sent for further evaluation. Denies dyspnea denies chest pain denies other complaint no other associated symptom Past Medical History  Diagnosis Date  . Hypertension   . Diabetes mellitus   . Hyperlipidemia   . Obesity     History reviewed. No pertinent past surgical history.  No family history on file.  History  Substance Use Topics  . Smoking status: Never Smoker   . Smokeless tobacco: Not on file  . Alcohol Use: No    OB History    Grav Para Term Preterm Abortions TAB SAB Ect Mult Living                  Review of Systems  Constitutional: Negative.   HENT: Negative.   Respiratory: Negative.   Cardiovascular: Positive for leg swelling.  Gastrointestinal: Negative.   Musculoskeletal: Negative.   Skin: Negative.   Neurological: Negative.   Hematological: Negative.   Psychiatric/Behavioral: Negative.   All other systems reviewed and are negative.    Allergies  Sulfa drugs cross reactors  Home Medications   Current Outpatient Rx  Name Route Sig Dispense Refill  . AMLODIPINE-OLMESARTAN 5-40 MG PO TABS Oral Take 1 tablet by mouth daily.      . OMEGA-3 FATTY ACIDS 1000 MG PO CAPS Oral Take 1 g by mouth daily.      Marland Kitchen GARLIC [id number] MG PO CAPS Oral Take 2 capsules by mouth daily.      Marland Kitchen GLIPIZIDE 10 MG PO TABS Oral  Take 10 mg by mouth 2 (two) times daily before a meal.      . NEBIVOLOL HCL 10 MG PO TABS Oral Take 10 mg by mouth daily.      Marland Kitchen POLYSACCHARIDE IRON 150 MG PO CAPS Oral Take 150 mg by mouth daily.      Marland Kitchen POTASSIUM CHLORIDE 20 MEQ PO PACK Oral Take 20 mEq by mouth daily.      Marland Kitchen PRAVASTATIN SODIUM 20 MG PO TABS Oral Take 20 mg by mouth at bedtime.      Marland Kitchen PRAZOSIN HCL 5 MG PO CAPS Oral Take 10 mg by mouth at bedtime.      . TORSEMIDE 10 MG PO TABS Oral Take 10 mg by mouth daily.        BP 185/94  Pulse 77  Temp(Src) 97.6 F (36.4 C) (Oral)  Resp 14  SpO2 97%  Physical Exam  Nursing note and vitals reviewed. Constitutional: She appears well-developed and well-nourished.  HENT:  Head: Normocephalic and atraumatic.  Eyes: Conjunctivae are normal. Pupils are equal, round, and reactive to light.  Neck: Neck supple. No tracheal deviation present. No thyromegaly present.  Cardiovascular: Normal rate and regular rhythm.   No murmur heard. Pulmonary/Chest: Effort normal and breath sounds normal.  Abdominal: Soft. Bowel sounds are normal. She exhibits no distension. There is no  tenderness.       Morbidly obese  Musculoskeletal: Normal range of motion. She exhibits edema. She exhibits no tenderness.       Bilateral lower extremities with 2-3+ pretibial pitting edema no redness no tenderness neurovascularly intact  Neurological: She is alert. Coordination normal.       Gait normal  Skin: Skin is warm and dry. No rash noted.  Psychiatric: She has a normal mood and affect.    ED Course  Procedures (including critical care time)  Labs Reviewed  CBC - Abnormal; Notable for the following:    WBC 11.2 (*)    RBC 3.69 (*)    Hemoglobin 10.5 (*)    HCT 33.0 (*)    All other components within normal limits  BASIC METABOLIC PANEL - Abnormal; Notable for the following:    Glucose, Bld 119 (*)    BUN 39 (*)    Creatinine, Ser 2.44 (*)    GFR calc non Af Amer 19 (*)    GFR calc Af Amer 22 (*)     All other components within normal limits  DIFFERENTIAL   No results found.   No diagnosis found.    MDM  To CDU. Patient not candidate for discharge prior to obtaining DVT studies Diagnosis #1 peripheral edema #2 renal insufficiency #3 hypertension        Orlie Dakin, MD 01/07/12 [phone]

## 2012-01-07 NOTE — ED Provider Notes (Signed)
7:56 AM Patient care resumed from the blue side, Dr. Winfred [name]. Pt presented with extremity weakness, bilateral pitting edema, renal insufficiency and hypertension w a hx of DM, HLD, & obesity. Patient observed over night with out any complaints per nursing staff. Patient is here for doppler imaging of lower extremities (elevated ddimer of 1.35)  and has received minipress, Zocor and Lovenox while in the ED. Patient re-evaluated and is resting comfortable, VSS, with no new complaints or concerns at this time. Plan per previous provider is for pt to receive DVT studies this morning prior to dc. if doppler is negative have pt f-u w her PCP as an OP.     9:18 AM Bilateral lower extremity venous duplex completed. Preliminary report is negative for DVT and SVT. There appears to be a Baker's cyst in the left pop fossa. Pts PCP Dr. Karlton [name] has been paged to discuss hospital course, arrange follow up & see if she wants to increase her pts diuretic dose? (currently on Torsemide 10 mg)   10:08 AM Dr. Karlton [name] states that the pt should be taking Torsemide 20mg . She requests that this dose be doubled to BID and that the pt call her office for follow up apt. Pt is agreeable to plan.    Jacqueline [name], [place] 01/07/12 1009

## 2012-01-07 NOTE — ED Notes (Addendum)
Pt okayed to take morning meds by PA Paz, pt has meds at bedside. Vascular staff at bedside for DVT r/o.

## 2012-01-07 NOTE — ED Notes (Signed)
Pt moved to another room due to noise of contruction

## 2012-01-07 NOTE — ED Notes (Signed)
Venous Doppler was done at bedside

## 2012-01-08 ENCOUNTER — Other Ambulatory Visit: Payer: Self-pay | Admitting: Internal Medicine

## 2012-01-08 DIAGNOSIS — Z1231 Encounter for screening mammogram for malignant neoplasm of breast: Secondary | ICD-10-CM

## 2012-02-04 ENCOUNTER — Ambulatory Visit (HOSPITAL_COMMUNITY)
Admission: RE | Admit: 2012-02-04 | Discharge: 2012-02-04 | Disposition: A | Discharge status: Home / Self Care | Payer: MEDICARE | Source: Ambulatory Visit | Attending: Internal Medicine | Admitting: Internal Medicine

## 2012-02-04 DIAGNOSIS — Z1231 Encounter for screening mammogram for malignant neoplasm of breast: Secondary | ICD-10-CM | POA: Insufficient documentation

## 2013-03-23 ENCOUNTER — Other Ambulatory Visit (HOSPITAL_COMMUNITY): Payer: Self-pay | Admitting: Internal Medicine

## 2013-03-23 DIAGNOSIS — Z1231 Encounter for screening mammogram for malignant neoplasm of breast: Secondary | ICD-10-CM

## 2013-03-26 ENCOUNTER — Ambulatory Visit (INDEPENDENT_AMBULATORY_CARE_PROVIDER_SITE_OTHER): Payer: Medicare Other | Admitting: Podiatry

## 2013-03-26 VITALS — Ht 66.0 in | Wt 300.0 lb

## 2013-03-26 DIAGNOSIS — B351 Tinea unguium: Secondary | ICD-10-CM

## 2013-03-26 DIAGNOSIS — M25579 Pain in unspecified ankle and joints of unspecified foot: Secondary | ICD-10-CM | POA: Insufficient documentation

## 2013-03-26 DIAGNOSIS — E119 Type 2 diabetes mellitus without complications: Secondary | ICD-10-CM

## 2013-03-26 NOTE — Progress Notes (Signed)
Subjective: 67 y.o. year old female patient presents complaining of painful nails, both great toes. Patient requests toe nails trimmed. She experienced gout last Christmas time and was not able to come in.  Her blood sugar runs at about 200 level and she is working on it.  Patient Summary List & History reviewed for allergies, medications, medical problems and surgical history.  Review of Systems - General ROS: negative for - chills, fatigue, fever, hot flashes, night sweats or sleep disturbance Ophthalmic ROS: negative ENT ROS: negative Hematological and Lymphatic ROS: negative Breast ROS: negative for breast lumps Respiratory ROS: Easily gets out of breath. Cardiovascular ROS: Hypertension. Gastrointestinal ROS: no abdominal pain, change in bowel habits, or black or bloody stools Genito-Urinary ROS: no dysuria, trouble voiding, or hematuria Musculoskeletal ROS: negative Neurological ROS: no TIA or stroke symptoms Dermatological ROS: negative  Objective: Dermatologic:  Thick hypertrophic nails x 10.  No skin lesions or abnormal findings.  Vascular: All pedal pulses palpable.  Positive of mild ankle edema bilateral.  Orthopedic:  Positive for mild bunion on left foot.  Neurologic:  All epicritic and tactile sensations grossly intact.  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  Treatment: All mycotic nails debrided.  Return in 3 months.

## 2013-04-09 ENCOUNTER — Ambulatory Visit (HOSPITAL_COMMUNITY)
Admission: RE | Admit: 2013-04-09 | Discharge: 2013-04-09 | Disposition: A | Discharge status: Home / Self Care | Payer: Medicare Other | Source: Ambulatory Visit | Attending: Internal Medicine | Admitting: Internal Medicine

## 2013-04-09 DIAGNOSIS — Z1231 Encounter for screening mammogram for malignant neoplasm of breast: Secondary | ICD-10-CM

## 2013-06-25 ENCOUNTER — Ambulatory Visit: Payer: Medicare Other | Admitting: Podiatry

## 2013-06-29 ENCOUNTER — Encounter: Payer: Self-pay | Admitting: Podiatry

## 2013-06-29 ENCOUNTER — Ambulatory Visit (INDEPENDENT_AMBULATORY_CARE_PROVIDER_SITE_OTHER): Payer: Medicare Other | Admitting: Podiatry

## 2013-06-29 VITALS — Ht 65.0 in | Wt 300.0 lb

## 2013-06-29 DIAGNOSIS — M25579 Pain in unspecified ankle and joints of unspecified foot: Secondary | ICD-10-CM

## 2013-06-29 DIAGNOSIS — B351 Tinea unguium: Secondary | ICD-10-CM

## 2013-06-29 DIAGNOSIS — E119 Type 2 diabetes mellitus without complications: Secondary | ICD-10-CM
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
## 2013-10-01 ENCOUNTER — Ambulatory Visit: Payer: Medicare Other | Admitting: Podiatry

## 2013-10-12 ENCOUNTER — Ambulatory Visit: Payer: Medicare Other | Admitting: Podiatry

## 2014-01-15 ENCOUNTER — Emergency Department (HOSPITAL_COMMUNITY): Payer: Medicare Other

## 2014-01-15 ENCOUNTER — Encounter (HOSPITAL_COMMUNITY): Payer: Self-pay | Admitting: Emergency Medicine

## 2014-01-15 ENCOUNTER — Inpatient Hospital Stay (HOSPITAL_COMMUNITY)
Admission: EM | Admit: 2014-01-15 | Discharge: 2014-01-28 | DRG: 264 | Disposition: A | Discharge status: Skilled Nursing Facility | Payer: Medicare Other | Attending: Internal Medicine | Admitting: Internal Medicine

## 2014-01-15 DIAGNOSIS — Z8249 Family history of ischemic heart disease and other diseases of the circulatory system: Secondary | ICD-10-CM

## 2014-01-15 DIAGNOSIS — J189 Pneumonia, unspecified organism: Secondary | ICD-10-CM | POA: Diagnosis not present

## 2014-01-15 DIAGNOSIS — D649 Anemia, unspecified: Secondary | ICD-10-CM

## 2014-01-15 DIAGNOSIS — N2581 Secondary hyperparathyroidism of renal origin: Secondary | ICD-10-CM

## 2014-01-15 DIAGNOSIS — N184 Chronic kidney disease, stage 4 (severe): Secondary | ICD-10-CM

## 2014-01-15 DIAGNOSIS — B351 Tinea unguium: Secondary | ICD-10-CM

## 2014-01-15 DIAGNOSIS — R609 Edema, unspecified: Secondary | ICD-10-CM | POA: Diagnosis present

## 2014-01-15 DIAGNOSIS — E1165 Type 2 diabetes mellitus with hyperglycemia: Secondary | ICD-10-CM | POA: Diagnosis present

## 2014-01-15 DIAGNOSIS — Z833 Family history of diabetes mellitus: Secondary | ICD-10-CM

## 2014-01-15 DIAGNOSIS — I5031 Acute diastolic (congestive) heart failure: Secondary | ICD-10-CM

## 2014-01-15 DIAGNOSIS — M25579 Pain in unspecified ankle and joints of unspecified foot: Secondary | ICD-10-CM

## 2014-01-15 DIAGNOSIS — IMO0001 Reserved for inherently not codable concepts without codable children: Secondary | ICD-10-CM | POA: Diagnosis present

## 2014-01-15 DIAGNOSIS — I509 Heart failure, unspecified: Secondary | ICD-10-CM

## 2014-01-15 DIAGNOSIS — G4733 Obstructive sleep apnea (adult) (pediatric): Secondary | ICD-10-CM

## 2014-01-15 DIAGNOSIS — N186 End stage renal disease: Secondary | ICD-10-CM | POA: Diagnosis present

## 2014-01-15 DIAGNOSIS — Z6841 Body Mass Index (BMI) 40.0 and over, adult: Secondary | ICD-10-CM

## 2014-01-15 DIAGNOSIS — E119 Type 2 diabetes mellitus without complications: Secondary | ICD-10-CM

## 2014-01-15 DIAGNOSIS — G9340 Encephalopathy, unspecified: Secondary | ICD-10-CM

## 2014-01-15 DIAGNOSIS — I10 Essential (primary) hypertension: Secondary | ICD-10-CM

## 2014-01-15 DIAGNOSIS — Z79899 Other long term (current) drug therapy: Secondary | ICD-10-CM

## 2014-01-15 DIAGNOSIS — D631 Anemia in chronic kidney disease: Secondary | ICD-10-CM | POA: Diagnosis present

## 2014-01-15 DIAGNOSIS — N039 Chronic nephritic syndrome with unspecified morphologic changes: Secondary | ICD-10-CM

## 2014-01-15 DIAGNOSIS — N058 Unspecified nephritic syndrome with other morphologic changes: Secondary | ICD-10-CM | POA: Diagnosis present

## 2014-01-15 DIAGNOSIS — I5033 Acute on chronic diastolic (congestive) heart failure: Principal | ICD-10-CM | POA: Diagnosis present

## 2014-01-15 DIAGNOSIS — R092 Respiratory arrest: Secondary | ICD-10-CM | POA: Diagnosis not present

## 2014-01-15 DIAGNOSIS — J9620 Acute and chronic respiratory failure, unspecified whether with hypoxia or hypercapnia: Secondary | ICD-10-CM | POA: Diagnosis present

## 2014-01-15 DIAGNOSIS — E1129 Type 2 diabetes mellitus with other diabetic kidney complication: Secondary | ICD-10-CM | POA: Diagnosis present

## 2014-01-15 DIAGNOSIS — Z992 Dependence on renal dialysis: Secondary | ICD-10-CM | POA: Diagnosis present

## 2014-01-15 DIAGNOSIS — I129 Hypertensive chronic kidney disease with stage 1 through stage 4 chronic kidney disease, or unspecified chronic kidney disease: Secondary | ICD-10-CM | POA: Diagnosis present

## 2014-01-15 DIAGNOSIS — J9600 Acute respiratory failure, unspecified whether with hypoxia or hypercapnia: Secondary | ICD-10-CM

## 2014-01-15 DIAGNOSIS — N179 Acute kidney failure, unspecified: Secondary | ICD-10-CM | POA: Diagnosis present

## 2014-01-15 DIAGNOSIS — N390 Urinary tract infection, site not specified: Secondary | ICD-10-CM | POA: Diagnosis not present

## 2014-01-15 DIAGNOSIS — E1122 Type 2 diabetes mellitus with diabetic chronic kidney disease: Secondary | ICD-10-CM | POA: Diagnosis present

## 2014-01-15 DIAGNOSIS — E785 Hyperlipidemia, unspecified: Secondary | ICD-10-CM | POA: Diagnosis present

## 2014-01-15 DIAGNOSIS — E662 Morbid (severe) obesity with alveolar hypoventilation: Secondary | ICD-10-CM

## 2014-01-15 LAB — PRO B NATRIURETIC PEPTIDE: Pro B Natriuretic peptide (BNP): 4646 pg/mL — ABNORMAL HIGH (ref 0–125)

## 2014-01-15 LAB — CBC WITH DIFFERENTIAL/PLATELET
BASOS ABS: 0 10*3/uL (ref 0.0–0.1)
Basophils Relative: 0 % (ref 0–1)
EOS ABS: 0.3 10*3/uL (ref 0.0–0.7)
Eosinophils Relative: 3 % (ref 0–5)
HCT: 28.9 % — ABNORMAL LOW (ref 36.0–46.0)
Hemoglobin: 9 g/dL — ABNORMAL LOW (ref 12.0–15.0)
LYMPHS ABS: 1.5 10*3/uL (ref 0.7–4.0)
Lymphocytes Relative: 15 % (ref 12–46)
MCH: 28.8 pg (ref 26.0–34.0)
MCHC: 31.1 g/dL (ref 30.0–36.0)
MCV: 92.3 fL (ref 78.0–100.0)
Monocytes Absolute: 0.5 10*3/uL (ref 0.1–1.0)
Monocytes Relative: 5 % (ref 3–12)
NEUTROS PCT: 77 % (ref 43–77)
Neutro Abs: 8 10*3/uL — ABNORMAL HIGH (ref 1.7–7.7)
PLATELETS: 294 10*3/uL (ref 150–400)
RBC: 3.13 MIL/uL — AB (ref 3.87–5.11)
RDW: 15.7 % — AB (ref 11.5–15.5)
WBC: 10.4 10*3/uL (ref 4.0–10.5)

## 2014-01-15 LAB — GLUCOSE, CAPILLARY: Glucose-Capillary: 187 mg/dL — ABNORMAL HIGH (ref 70–99)

## 2014-01-15 LAB — BASIC METABOLIC PANEL
BUN: 53 mg/dL — ABNORMAL HIGH (ref 6–23)
CO2: 26 mEq/L (ref 19–32)
Calcium: 8.4 mg/dL (ref 8.4–10.5)
Chloride: 102 mEq/L (ref 96–112)
Creatinine, Ser: 3.33 mg/dL — ABNORMAL HIGH (ref 0.50–1.10)
GFR, EST AFRICAN AMERICAN: 15 mL/min — AB (ref >90)
GFR, EST NON AFRICAN AMERICAN: 13 mL/min — AB (ref >90)
GLUCOSE: 199 mg/dL — AB (ref 70–99)
POTASSIUM: 4.5 meq/L (ref 3.7–5.3)
SODIUM: 143 meq/L (ref 137–147)

## 2014-01-15 LAB — POCT I-STAT TROPONIN I: TROPONIN I, POC: 0.02 ng/mL (ref 0.00–0.08)

## 2014-01-15 LAB — OCCULT BLOOD, POC DEVICE: FECAL OCCULT BLD: NEGATIVE

## 2014-01-15 MED ORDER — ONDANSETRON HCL 4 MG/2ML IJ SOLN: 4.0000 mg | Freq: Four times a day (QID) | INTRAMUSCULAR | Status: DC | PRN

## 2014-01-15 MED ORDER — FUROSEMIDE 10 MG/ML IJ SOLN
80.0000 mg | Freq: Two times a day (BID) | INTRAMUSCULAR | Status: AC
  Administered 2014-01-16 – 2014-01-17 (×3): 80 mg via INTRAVENOUS
  Filled 2014-01-15 (×3): qty 8

## 2014-01-15 MED ORDER — SODIUM CHLORIDE 0.9 % IV SOLN
250.0000 mL | INTRAVENOUS | Status: DC | PRN
  Administered 2014-01-21: 250 mL via INTRAVENOUS

## 2014-01-15 MED ORDER — NITROGLYCERIN 2 % TD OINT
1.0000 [in_us] | TOPICAL_OINTMENT | Freq: Once | TRANSDERMAL | Status: AC
  Administered 2014-01-15: 1 [in_us] via TOPICAL
  Filled 2014-01-15: qty 1

## 2014-01-15 MED ORDER — ACETAMINOPHEN 325 MG PO TABS
650.0000 mg | ORAL_TABLET | ORAL | Status: DC | PRN
  Administered 2014-01-16 – 2014-01-23 (×7): 650 mg via ORAL
  Filled 2014-01-15 (×7): qty 2

## 2014-01-15 MED ORDER — INSULIN ASPART 100 UNIT/ML ~~LOC~~ SOLN: 0.0000 [IU] | Freq: Every day | SUBCUTANEOUS | Status: DC

## 2014-01-15 MED ORDER — INSULIN ASPART 100 UNIT/ML ~~LOC~~ SOLN
0.0000 [IU] | Freq: Three times a day (TID) | SUBCUTANEOUS | Status: DC
  Administered 2014-01-16: 2 [IU] via SUBCUTANEOUS
  Administered 2014-01-16: 1 [IU] via SUBCUTANEOUS
  Administered 2014-01-17: 3 [IU] via SUBCUTANEOUS
  Administered 2014-01-17: 1 [IU] via SUBCUTANEOUS
  Administered 2014-01-17: 3 [IU] via SUBCUTANEOUS
  Administered 2014-01-18: 1 [IU] via SUBCUTANEOUS
  Administered 2014-01-18: 2 [IU] via SUBCUTANEOUS
  Administered 2014-01-19: 3 [IU] via SUBCUTANEOUS

## 2014-01-15 MED ORDER — FUROSEMIDE 10 MG/ML IJ SOLN
80.0000 mg | Freq: Once | INTRAMUSCULAR | Status: AC
  Administered 2014-01-15: 80 mg via INTRAVENOUS
  Filled 2014-01-15: qty 8

## 2014-01-15 MED ORDER — NITROGLYCERIN 2 % TD OINT
1.0000 [in_us] | TOPICAL_OINTMENT | Freq: Four times a day (QID) | TRANSDERMAL | Status: DC
  Administered 2014-01-16 (×3): 1 [in_us] via TOPICAL
  Filled 2014-01-15: qty 30

## 2014-01-15 MED ORDER — SODIUM CHLORIDE 0.9 % IJ SOLN: 3.0000 mL | INTRAMUSCULAR | Status: DC | PRN

## 2014-01-15 MED ORDER — ENOXAPARIN SODIUM 30 MG/0.3ML ~~LOC~~ SOLN
30.0000 mg | SUBCUTANEOUS | Status: DC
  Administered 2014-01-16 – 2014-01-19 (×5): 30 mg via SUBCUTANEOUS
  Filled 2014-01-15 (×8): qty 0.3

## 2014-01-15 MED ORDER — SODIUM CHLORIDE 0.9 % IJ SOLN
3.0000 mL | Freq: Two times a day (BID) | INTRAMUSCULAR | Status: DC
  Administered 2014-01-16 – 2014-01-28 (×20): 3 mL via INTRAVENOUS

## 2014-01-15 NOTE — ED Notes (Signed)
Pt reports SOB that has been on going x1 month. Reports it intermittently increases in severity. Has been treated by PCP with inhaler with no relief. Pt denies SOB at this time but reports it will "come on" randomly. Pt reports recent car ride to [place] from [facility]. And states she travels there often.

## 2014-01-15 NOTE — ED Provider Notes (Signed)
CSN: [id number]     Arrival date & time 01/15/14  1648 History   First MD Initiated Contact with Patient 01/15/14 1700     Chief Complaint  Patient presents with  . Shortness of Breath  . Leg Swelling     (Consider location/radiation/quality/duration/timing/severity/associated sxs/prior Treatment) HPI Comments: Pt is c/o sob and bilateral lower extremity edema for 1 month. Pt states that her pmd gave her an inhaler which helps with the episodes. Pt states that she had an episode a short time ago today and now the symptoms have resolved:pt state that her doctor had her scheduled for a chest x-ray but she hasn't gone to get one. Pt states that the swelling goes down at night, but comes back early on in the [id number] history of mi or chf  The history is provided by the patient. No language interpreter was used.    Past Medical History  Diagnosis Date  . Hypertension   . Diabetes mellitus   . Hyperlipidemia   . Obesity    History reviewed. No pertinent past surgical history. No family history on file. History  Substance Use Topics  . Smoking status: Never Smoker   . Smokeless tobacco: Never Used  . Alcohol Use: No   OB History   Grav Para Term Preterm Abortions TAB SAB Ect Mult Living                 Review of Systems  Constitutional: Negative for fever.  Respiratory: Positive for shortness of breath. Negative for cough.   Cardiovascular: Negative for chest pain.      Allergies  Sulfa drugs cross reactors  Home Medications   Current Outpatient Rx  Name  Route  Sig  Dispense  Refill  . albuterol (PROVENTIL HFA;VENTOLIN HFA) 108 (90 BASE) MCG/ACT inhaler   Inhalation   Inhale 2 puffs into the lungs every 6 (six) hours as needed for wheezing or shortness of breath.         Marland Kitchen amLODipine-olmesartan (AZOR) 5-40 MG per tablet   Oral   Take 1 tablet by mouth daily.           Marland Kitchen b complex vitamins capsule   Oral   Take 1 capsule by mouth daily.         .  cholecalciferol (VITAMIN D) 1000 UNITS tablet   Oral   Take 2,000 Units by mouth daily.         . fish oil-omega-3 fatty acids 1000 MG capsule   Oral   Take 1 g by mouth daily.           . Garlic AB-[id number] MG CAPS   Oral   Take 2 capsules by mouth daily.           Marland Kitchen glipiZIDE (GLUCOTROL) 10 MG tablet   Oral   Take 10 mg by mouth 2 (two) times daily before a meal.           . magnesium 30 MG tablet   Oral   Take 30 mg by mouth 2 (two) times daily.         . nebivolol (BYSTOLIC) 10 MG tablet   Oral   Take 10 mg by mouth daily.           Marland Kitchen OVER THE COUNTER MEDICATION      Gout out herbal. 1 tablet once daily         . polysaccharide iron (NIFEREX) 150 MG CAPS capsule   Oral  Take 150 mg by mouth daily.           . potassium chloride (KLOR-CON) 20 MEQ packet   Oral   Take 20 mEq by mouth daily.           . pravastatin (PRAVACHOL) 20 MG tablet   Oral   Take 20 mg by mouth at bedtime.           . prazosin (MINIPRESS) 5 MG capsule   Oral   Take 10 mg by mouth at bedtime.           . torsemide (DEMADEX) 10 MG tablet   Oral   Take 2 tablets (20 mg total) by mouth 2 (two) times daily.   30 tablet   0    BP 175/75  Pulse 80  Temp(Src) 98.1 F (36.7 C) (Oral)  Resp 25  SpO2 87% Physical Exam  Nursing note and vitals reviewed. Constitutional: She is oriented to person, place, and time. She appears well-developed and well-nourished.  Cardiovascular: Normal rate and regular rhythm.   Pulmonary/Chest: Effort normal. She has rales.  Pt in no distress;able answer all questions without any distress  Musculoskeletal: Normal range of motion. She exhibits edema.  Neurological: She is alert and oriented to person, place, and time. Coordination normal.    ED Course  Procedures (including critical care time) Labs Review Labs Reviewed  CBC WITH DIFFERENTIAL - Abnormal; Notable for the following:    RBC 3.13 (*)    Hemoglobin 9.0 (*)    HCT 28.9 (*)     RDW 15.7 (*)    Neutro Abs 8.0 (*)    All other components within normal limits  BASIC METABOLIC PANEL - Abnormal; Notable for the following:    Glucose, Bld 199 (*)    BUN 53 (*)    Creatinine, Ser 3.33 (*)    GFR calc non Af Amer 13 (*)    GFR calc Af Amer 15 (*)    All other components within normal limits  OCCULT BLOOD X 1 CARD TO LAB, STOOL  PRO B NATRIURETIC PEPTIDE  POCT I-STAT TROPONIN I  OCCULT BLOOD, POC DEVICE   Imaging Review Dg Chest 2 View  01/15/2014   CLINICAL DATA:  Short of breath, leg swelling  EXAM: CHEST  2 VIEW  COMPARISON:  Prior chest x-ray 10/16/2011  FINDINGS: Increased cardiomegaly. Pulmonary vascular congestion with mild interstitial edema. Bibasilar opacities favored to reflect dependent edema and atelectasis. No pneumothorax. No large pleural effusion. No acute osseous abnormality.  IMPRESSION: Mild-moderate CHF.   Electronically Signed   By: Jacqulynn Cadet M.D.   On: 01/15/2014 18:56    EKG Interpretation    Date/Time:  Saturday January 15 2014 17:37:43 EST Ventricular Rate:  69 PR Interval:  187 QRS Duration: 101 QT Interval:  438 QTC Calculation: 469 R Axis:   91 Text Interpretation:  Sinus rhythm Atrial premature complex Probable left atrial enlargement Right axis deviation Low voltage, extremity and precordial leads Confirmed by HORTON  MD, Loma Sousa ([id number]) on 01/15/2014 7:01:03 PM            MDM   Final diagnoses:  CHF (congestive heart failure)    Spoke with Dr. Einar Gip and pt to get lasix and nitro. He is willing to consult on pt:pt to be admitted to [place], NP 01/15/14 2000

## 2014-01-15 NOTE — ED Notes (Signed)
MD at bedside. 
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
## 2014-01-15 NOTE — ED Notes (Signed)
Pt c/o shortness of breath, swelling to B/L legs and abdomen ongoing for about 1 month. Pt has been seen by PMD and given an inhaler. Pt reports gets better but then symptoms return shortly after. Pt presents with labored breath at rest and with activity. Swelling noted to abdomen and B/L legs L>R.

## 2014-01-15 NOTE — ED Notes (Signed)
Meal tray ordered for pt  

## 2014-01-16 LAB — RETICULOCYTES
RBC.: 3 MIL/uL — ABNORMAL LOW (ref 3.87–5.11)
RETIC COUNT ABSOLUTE: 78 10*3/uL (ref 19.0–186.0)
RETIC CT PCT: 2.6 % (ref 0.4–3.1)

## 2014-01-16 LAB — BASIC METABOLIC PANEL
BUN: 54 mg/dL — ABNORMAL HIGH (ref 6–23)
CALCIUM: 8.4 mg/dL (ref 8.4–10.5)
CO2: 27 mEq/L (ref 19–32)
Chloride: 105 mEq/L (ref 96–112)
Creatinine, Ser: 3.38 mg/dL — ABNORMAL HIGH (ref 0.50–1.10)
GFR, EST AFRICAN AMERICAN: 15 mL/min — AB (ref >90)
GFR, EST NON AFRICAN AMERICAN: 13 mL/min — AB (ref >90)
Glucose, Bld: 169 mg/dL — ABNORMAL HIGH (ref 70–99)
POTASSIUM: 5 meq/L (ref 3.7–5.3)
SODIUM: 146 meq/L (ref 137–147)

## 2014-01-16 LAB — VITAMIN B12: Vitamin B-12: 552 pg/mL (ref 211–911)

## 2014-01-16 LAB — FERRITIN: FERRITIN: 97 ng/mL (ref 10–291)

## 2014-01-16 LAB — GLUCOSE, CAPILLARY
GLUCOSE-CAPILLARY: 143 mg/dL — AB (ref 70–99)
GLUCOSE-CAPILLARY: 90 mg/dL (ref 70–99)
Glucose-Capillary: 160 mg/dL — ABNORMAL HIGH (ref 70–99)
Glucose-Capillary: 138 mg/dL — ABNORMAL HIGH (ref 70–99)

## 2014-01-16 LAB — HEMOGLOBIN A1C
Hgb A1c MFr Bld: 7.3 % — ABNORMAL HIGH (ref <5.7)
MEAN PLASMA GLUCOSE: 163 mg/dL — AB (ref <117)

## 2014-01-16 LAB — IRON AND TIBC
Iron: 37 ug/dL — ABNORMAL LOW (ref 42–135)
Saturation Ratios: 17 % — ABNORMAL LOW (ref 20–55)
TIBC: 217 ug/dL — AB (ref 250–470)
UIBC: 180 ug/dL (ref 125–400)

## 2014-01-16 LAB — FOLATE: Folate: >20 ng/mL

## 2014-01-16 MED ORDER — ALBUTEROL SULFATE (2.5 MG/3ML) 0.083% IN NEBU: 3.0000 mL | INHALATION_SOLUTION | Freq: Four times a day (QID) | RESPIRATORY_TRACT | Status: DC | PRN

## 2014-01-16 MED ORDER — POLYSACCHARIDE IRON COMPLEX 150 MG PO CAPS
150.0000 mg | ORAL_CAPSULE | Freq: Every day | ORAL | Status: DC
  Administered 2014-01-16 – 2014-01-28 (×12): 150 mg via ORAL
  Filled 2014-01-16 (×13): qty 1

## 2014-01-16 MED ORDER — VITAMIN D3 25 MCG (1000 UNIT) PO TABS
2000.0000 [IU] | ORAL_TABLET | Freq: Every day | ORAL | Status: DC
  Administered 2014-01-16 – 2014-01-28 (×12): 2000 [IU] via ORAL
  Filled 2014-01-16 (×13): qty 2

## 2014-01-16 MED ORDER — AMLODIPINE-OLMESARTAN 5-40 MG PO TABS: 1.0000 | ORAL_TABLET | Freq: Every day | ORAL | Status: DC

## 2014-01-16 MED ORDER — MAGNESIUM 30 MG PO TABS: 30.0000 mg | ORAL_TABLET | Freq: Two times a day (BID) | ORAL | Status: DC

## 2014-01-16 MED ORDER — OMEGA-3 FATTY ACIDS 1000 MG PO CAPS: 1.0000 g | ORAL_CAPSULE | Freq: Every day | ORAL | Status: DC

## 2014-01-16 MED ORDER — GLIPIZIDE 10 MG PO TABS
10.0000 mg | ORAL_TABLET | Freq: Two times a day (BID) | ORAL | Status: DC
  Administered 2014-01-16 – 2014-01-19 (×6): 10 mg via ORAL
  Filled 2014-01-16 (×12): qty 1

## 2014-01-16 MED ORDER — OMEGA-3-ACID ETHYL ESTERS 1 G PO CAPS
1.0000 g | ORAL_CAPSULE | Freq: Every day | ORAL | Status: DC
  Administered 2014-01-16 – 2014-01-28 (×12): 1 g via ORAL
  Filled 2014-01-16 (×13): qty 1

## 2014-01-16 MED ORDER — B COMPLEX VITAMINS PO CAPS
1.0000 | ORAL_CAPSULE | Freq: Every day | ORAL | Status: DC
  Administered 2014-01-16: 1 via ORAL
  Filled 2014-01-16 (×4): qty 1

## 2014-01-16 MED ORDER — ISOSORB DINITRATE-HYDRALAZINE 20-37.5 MG PO TABS
1.0000 | ORAL_TABLET | Freq: Two times a day (BID) | ORAL | Status: DC
  Administered 2014-01-16 – 2014-01-19 (×6): 1 via ORAL
  Filled 2014-01-16 (×10): qty 1

## 2014-01-16 MED ORDER — SIMVASTATIN 10 MG PO TABS
10.0000 mg | ORAL_TABLET | Freq: Every day | ORAL | Status: DC
  Administered 2014-01-16 – 2014-01-19 (×4): 10 mg via ORAL
  Filled 2014-01-16 (×7): qty 1

## 2014-01-16 MED ORDER — AMLODIPINE BESYLATE 5 MG PO TABS
5.0000 mg | ORAL_TABLET | Freq: Every day | ORAL | Status: DC
  Administered 2014-01-16 – 2014-01-18 (×3): 5 mg via ORAL
  Filled 2014-01-16 (×5): qty 1

## 2014-01-16 MED ORDER — PRAZOSIN HCL 5 MG PO CAPS
10.0000 mg | ORAL_CAPSULE | Freq: Every day | ORAL | Status: DC
  Administered 2014-01-16 – 2014-01-27 (×11): 10 mg via ORAL
  Filled 2014-01-16 (×14): qty 2

## 2014-01-16 MED ORDER — IRBESARTAN 300 MG PO TABS
300.0000 mg | ORAL_TABLET | Freq: Every day | ORAL | Status: DC
  Administered 2014-01-16: 300 mg via ORAL
  Filled 2014-01-16: qty 1

## 2014-01-16 MED ORDER — NEBIVOLOL HCL 10 MG PO TABS
10.0000 mg | ORAL_TABLET | Freq: Every day | ORAL | Status: DC
  Administered 2014-01-16 – 2014-01-18 (×3): 10 mg via ORAL
  Filled 2014-01-16 (×5): qty 1

## 2014-01-16 NOTE — Progress Notes (Signed)
[facility] PROGRESS NOTE  Filed Weights   01/15/14 2119 01/16/14 0507  Weight: 147.737 kg (325 lb 11.2 oz) 146.194 kg (322 lb 4.8 oz)        Intake/Output Summary (Last 24 hours) at 01/16/14 1558 Last data filed at 01/16/14 1300  Gross per 24 hour  Intake    480 ml  Output   1750 ml  Net  -1270 ml     Assessment/Plan: 1-acute respiratory failure with hypoxia secondary to acute on chronic diastolic heart failure: -Will continue IV Lasix, low-sodium diet, strict intake and output, daily weights -Continue beta blockers and BiDil -Closed followup to renal function and electrolytes -Cardiology has been consulted and will follow any further recommendations. -2-D echo has been ordered and pending at this time  2-CKD (chronic kidney disease) stage 4, GFR 15-29 ml/min -Close monitoring of renal function with IV diuresis -That is a slight worsening from baseline back will be due to IV diuresis versus cardiorenal syndrome.  3-Hypertension: Continue current medication and follow blood pressure for further adjustment.  4-Diabetes mellitus: A1c 7.3. -Continue sliding scale insulin and glipizide -Patient following a low carbohydrates diet    5-Anemia: Appears to be anemia of chronic disease. -Will follow anemia panel  6-hyperlipidemia: Continue statins.   DVT: Lovenox    Code Status: Full Family Communication: No family at bedside Disposition Plan: Home when medical stable   Consultants:  Dr. Einar Gip  Procedures: ECHO: Pending  Antibiotics:  None  HPI/Subjective: Feeding is slightly better but is still short of breath. Patient with physical findings of fluid overload (positive JVD, positive crackles, 3+ edema bilaterally)  Objective: Filed Vitals:   01/16/14 0020 01/16/14 0507 01/16/14 1026 01/16/14 1330  BP: 177/63 151/63 152/55 150/53  Pulse: 76 67 64 63  Temp: 98 F (36.7 C) 97.9 F (36.6 C)  97.8 F (36.6 C)  TempSrc: Oral Oral  Oral  Resp: 18 18 17  18   Weight:  146.194 kg (322 lb 4.8 oz)    SpO2: 94% 97% 96% 100%     Exam:  General: Alert, awake, oriented x3, in no acute distress.  HEENT: No bruits, no goiter. Positive JVD Heart: Regular rate, no rubs or gallops; +3+ edema bilaterally Lungs: Decreased breath sounds bilaterally, positive crackles Abdomen: Soft, nontender, nondistended, positive bowel sounds.  Neuro: Grossly intact, nonfocal.   Data Reviewed: Basic Metabolic Panel:  Recent Labs Lab 01/15/14 1730 01/16/14 0400  NA 143 146  K 4.5 5.0  CL 102 105  CO2 26 27  GLUCOSE 199* 169*  BUN 53* 54*  CREATININE 3.33* 3.38*  CALCIUM 8.4 8.4   CBC:  Recent Labs Lab 01/15/14 1730  WBC 10.4  NEUTROABS 8.0*  HGB 9.0*  HCT 28.9*  MCV 92.3  PLT 294   BNP (last 3 results)  Recent Labs  01/15/14 1730  PROBNP 4646.0*   CBG:  Recent Labs Lab 01/15/14 2122 01/16/14 0651 01/16/14 1112  GLUCAP 187* 160* 143*    Studies: Dg Chest 2 View  01/15/2014   CLINICAL DATA:  Short of breath, leg swelling  EXAM: CHEST  2 VIEW  COMPARISON:  Prior chest x-ray 10/16/2011  FINDINGS: Increased cardiomegaly. Pulmonary vascular congestion with mild interstitial edema. Bibasilar opacities favored to reflect dependent edema and atelectasis. No pneumothorax. No large pleural effusion. No acute osseous abnormality.  IMPRESSION: Mild-moderate CHF.   Electronically Signed   By: Jacqulynn Cadet M.D.   On: 01/15/2014 18:56    Scheduled Meds: . amLODipine  5  mg Oral Daily  . b complex vitamins  1 capsule Oral Daily  . cholecalciferol  2,000 Units Oral Daily  . enoxaparin (LOVENOX) injection  30 mg Subcutaneous Q24H  . furosemide  80 mg Intravenous Q12H  . glipiZIDE  10 mg Oral BID AC  . insulin aspart  0-5 Units Subcutaneous QHS  . insulin aspart  0-9 Units Subcutaneous TID WC  . iron polysaccharides  150 mg Oral Daily  . isosorbide-hydrALAZINE  1 tablet Oral BID  . nebivolol  10 mg Oral Daily  . omega-3 acid ethyl  esters  1 g Oral Daily  . prazosin  10 mg Oral QHS  . simvastatin  10 mg Oral q1800  . sodium chloride  3 mL Intravenous Q12H    Chritopher Coster  [facility] Pager [phone]. If 8PM-8AM, please contact night-coverage at [email or web address], password [facility] 01/16/2014, 3:58 PM  LOS: 1 day

## 2014-01-16 NOTE — Progress Notes (Signed)
Patient arrived from ED via stretcher. No complaints of chest pain or  respiratory distress at rest. Sinus rhythm on telemetry. Vital signs stable. Will monitor patient.

## 2014-01-16 NOTE — ED Provider Notes (Signed)
Medical screening examination/treatment/procedure(s) were conducted as a shared visit with non-physician practitioner(s) and myself.  I personally evaluated the patient during the encounter.   Patient presents with progressive shortness of breath. She is requiring oxygen to maintain O2 sats. She appears volume overloaded on exam with 2+ bilateral lower extremity edema and crackles. She is in no respiratory distress.  No known history of CHF. She is on torsemide. She does have an outpatient cardiologist. EKG reassuring. Chest x-ray shows evidence of pulmonary edema suggestive of CHF. Given oxygen requirement, patient will be admitted to hospital. Her cardiologist contacted and will consult. Patient to be admitted to the hospital service.   Merryl Hacker, MD 01/16/14 1054

## 2014-01-17 LAB — GLUCOSE, CAPILLARY
GLUCOSE-CAPILLARY: 140 mg/dL — AB (ref 70–99)
GLUCOSE-CAPILLARY: 191 mg/dL — AB (ref 70–99)
Glucose-Capillary: 220 mg/dL — ABNORMAL HIGH (ref 70–99)
Glucose-Capillary: 211 mg/dL — ABNORMAL HIGH (ref 70–99)

## 2014-01-17 LAB — BASIC METABOLIC PANEL
BUN: 60 mg/dL — AB (ref 6–23)
CO2: 27 mEq/L (ref 19–32)
Calcium: 8.2 mg/dL — ABNORMAL LOW (ref 8.4–10.5)
Chloride: 100 mEq/L (ref 96–112)
Creatinine, Ser: 3.89 mg/dL — ABNORMAL HIGH (ref 0.50–1.10)
GFR, EST AFRICAN AMERICAN: 13 mL/min — AB (ref >90)
GFR, EST NON AFRICAN AMERICAN: 11 mL/min — AB (ref >90)
Glucose, Bld: 240 mg/dL — ABNORMAL HIGH (ref 70–99)
Potassium: 5.3 mEq/L (ref 3.7–5.3)
SODIUM: 139 meq/L (ref 137–147)

## 2014-01-17 LAB — IRON AND TIBC
IRON: 30 ug/dL — AB (ref 42–135)
SATURATION RATIOS: 14 % — AB (ref 20–55)
TIBC: 218 ug/dL — ABNORMAL LOW (ref 250–470)
UIBC: 188 ug/dL (ref 125–400)

## 2014-01-17 MED ORDER — FUROSEMIDE 10 MG/ML IJ SOLN
80.0000 mg | Freq: Two times a day (BID) | INTRAMUSCULAR | Status: DC
  Administered 2014-01-17 – 2014-01-18 (×2): 80 mg via INTRAVENOUS
  Filled 2014-01-17 (×3): qty 8

## 2014-01-17 MED ORDER — B COMPLEX-C PO TABS
1.0000 | ORAL_TABLET | Freq: Every day | ORAL | Status: DC
  Administered 2014-01-17 – 2014-01-28 (×10): 1 via ORAL
  Filled 2014-01-17 (×12): qty 1

## 2014-01-17 NOTE — Consult Note (Signed)
Reason for Consult:AKI/CKD Referring Physician: Anabell Cholico is an 67 y.o. female.  HPI: Pt is a 68yo F with PMH sig for longstanding DM, HTN, obesity, and progressive CKD stage 4 (baseline Scr 2.8-3.3) who was admitted on 01/15/14 with a one month h/o worsening lower ext edema, SOB and orthopnea.  She was found to have acute on chronic diastolic CHF (due to obesity, HTN, and CKD) and was admitted for IV diuresis.  Her Scr has increased since admission and we were asked to help evaluate and manage her AKI/CKD in setting of decompensated CHF and active diuresis.  Of note, she has been taking her ARB PTA and received a dose of irbesartan yesterday.  Her trend in Scr is seen below.  Trend in Creatinine: Creatinine, Ser  Date/Time Value Ref Range Status  01/17/2014  5:25 AM 3.89* 0.50 - 1.10 mg/dL Final  01/16/2014  4:00 AM 3.38* 0.50 - 1.10 mg/dL Final  01/15/2014  5:30 PM 3.33* 0.50 - 1.10 mg/dL Final  08/17/2013 2:36PM 2.82  Final  12/22/2012 5:35 PM 2.77 0.50--1.10 mg/dL Final  01/06/2012  9:32 PM 2.44* 0.50 - 1.10 mg/dL Final  10/16/2011 10:58 PM 2.20* 0.50 - 1.10 mg/dL Final  05/24/2009 10:52 PM 2.4* 0.4 - 1.2 mg/dL Final  04/26/2008 10:55 AM 1.66*  Final  02/17/2008 11:40 PM 1.8*  Final    PMH:   Past Medical History  Diagnosis Date  . Hypertension   . Diabetes mellitus   . Hyperlipidemia   . Obesity     PSH:   Past Surgical History  Procedure Laterality Date  . Abdominal hysterectomy      Allergies:  Allergies  Allergen Reactions  . Sulfa Drugs Cross Reactors Other (See Comments)    Doesn't remember     Medications:   Prior to Admission medications   Medication Sig Start Date End Date Taking? Authorizing Provider  albuterol (PROVENTIL HFA;VENTOLIN HFA) 108 (90 BASE) MCG/ACT inhaler Inhale 2 puffs into the lungs every 6 (six) hours as needed for wheezing or shortness of breath.   Yes Historical Provider, MD  amLODipine-olmesartan (AZOR) 5-40 MG per tablet Take 1  tablet by mouth daily.     Yes Historical Provider, MD  b complex vitamins capsule Take 1 capsule by mouth daily.   Yes Historical Provider, MD  cholecalciferol (VITAMIN D) 1000 UNITS tablet Take 2,000 Units by mouth daily.   Yes Historical Provider, MD  fish oil-omega-3 fatty acids 1000 MG capsule Take 1 g by mouth daily.     Yes Historical Provider, MD  Garlic [id number] MG CAPS Take 2 capsules by mouth daily.     Yes Historical Provider, MD  glipiZIDE (GLUCOTROL) 10 MG tablet Take 10 mg by mouth 2 (two) times daily before a meal.     Yes Historical Provider, MD  magnesium 30 MG tablet Take 30 mg by mouth 2 (two) times daily.   Yes Historical Provider, MD  nebivolol (BYSTOLIC) 10 MG tablet Take 10 mg by mouth daily.     Yes Historical Provider, MD  OVER THE COUNTER MEDICATION Gout out herbal. 1 tablet once daily   Yes Historical Provider, MD  polysaccharide iron (NIFEREX) 150 MG CAPS capsule Take 150 mg by mouth daily.     Yes Historical Provider, MD  potassium chloride (KLOR-CON) 20 MEQ packet Take 20 mEq by mouth daily.     Yes Historical Provider, MD  pravastatin (PRAVACHOL) 20 MG tablet Take 20 mg by mouth at bedtime.  Yes Historical Provider, MD  prazosin (MINIPRESS) 5 MG capsule Take 10 mg by mouth at bedtime.     Yes Historical Provider, MD  torsemide (DEMADEX) 10 MG tablet Take 2 tablets (20 mg total) by mouth 2 (two) times daily. 01/07/12  Yes Verl Dicker, PA-C    Inpatient medications: . amLODipine  5 mg Oral Daily  . B-complex with vitamin C  1 tablet Oral Daily  . cholecalciferol  2,000 Units Oral Daily  . enoxaparin (LOVENOX) injection  30 mg Subcutaneous Q24H  . glipiZIDE  10 mg Oral BID AC  . insulin aspart  0-5 Units Subcutaneous QHS  . insulin aspart  0-9 Units Subcutaneous TID WC  . iron polysaccharides  150 mg Oral Daily  . isosorbide-hydrALAZINE  1 tablet Oral BID  . nebivolol  10 mg Oral Daily  . omega-3 acid ethyl esters  1 g Oral Daily  . prazosin  10 mg Oral QHS  .  simvastatin  10 mg Oral q1800  . sodium chloride  3 mL Intravenous Q12H    Discontinued Meds:   Medications Discontinued During This Encounter  Medication Reason  . amLODipine-olmesartan (AZOR) 5-40 MG per tablet 1 tablet Formulary change  . fish oil-omega-3 fatty acids capsule 1 g Formulary change  . magnesium tablet 30 mg Formulary change  . irbesartan (AVAPRO) tablet 300 mg   . nitroGLYCERIN (NITROGLYN) 2 % ointment 1 inch   . b complex vitamins capsule 1 capsule Duplicate    Social History:  reports that she has never smoked. She has never used smokeless tobacco. She reports that she does not drink alcohol or use illicit drugs.  Family History:   Family History  Problem Relation Age of Onset  . Diabetes Mother   . CAD Father   . CAD Sister     A comprehensive review of systems was negative except for: Constitutional: positive for fatigue Respiratory: positive for dyspnea on exertion Cardiovascular: positive for lower extremity edema and orthopnea Weight change: -0.437 kg (-15.4 oz)  Intake/Output Summary (Last 24 hours) at 01/17/14 1449 Last data filed at 01/17/14 0951  Gross per 24 hour  Intake    720 ml  Output    600 ml  Net    120 ml   BP 133/52  Pulse 62  Temp(Src) 97.9 F (36.6 C) (Oral)  Resp 18  Wt 147.3 kg (324 lb 11.8 oz)  SpO2 96% Filed Vitals:   01/16/14 2054 01/17/14 0641 01/17/14 0921 01/17/14 1416  BP: 170/52 136/53 143/54 133/52  Pulse: 69 73 66 62  Temp: 98.2 F (36.8 C) 98.7 F (37.1 C)  97.9 F (36.6 C)  TempSrc: Oral Oral  Oral  Resp: 18 17  18   Weight:  147.3 kg (324 lb 11.8 oz)    SpO2: 95% 92%  96%     General appearance: fatigued and morbidly obese Head: Normocephalic, without obvious abnormality, atraumatic Neck: no adenopathy, no carotid bruit, no JVD, supple, symmetrical, trachea midline and thyroid not enlarged, symmetric, no tenderness/mass/nodules Resp: diminished breath sounds bibasilar Cardio: regular rate and rhythm,  S1, S2 normal, no murmur, click, rub or gallop and faint HS due to body habitus GI: obese, +BS, soft, NT Extremities: edema 2+ pitting bilateral lower ext  Labs: Basic Metabolic Panel:  Recent Labs Lab 01/15/14 1730 01/16/14 0400 01/17/14 0525  NA 143 146 139  K 4.5 5.0 5.3  CL 102 105 100  CO2 26 27 27   GLUCOSE 199* 169* 240*  BUN  53* 54* 60*  CREATININE 3.33* 3.38* 3.89*  CALCIUM 8.4 8.4 8.2*   Liver Function Tests: No results found for this basename: AST, ALT, ALKPHOS, BILITOT, PROT, ALBUMIN,  in the last 168 hours No results found for this basename: LIPASE, AMYLASE,  in the last 168 hours No results found for this basename: AMMONIA,  in the last 168 hours CBC:  Recent Labs Lab 01/15/14 1730  WBC 10.4  NEUTROABS 8.0*  HGB 9.0*  HCT 28.9*  MCV 92.3  PLT 294   PT/INR: @LABRCNTIP (inr:5) Cardiac Enzymes: )No results found for this basename: CKTOTAL, CKMB, CKMBINDEX, TROPONINI,  in the last 168 hours CBG:  Recent Labs Lab 01/16/14 1112 01/16/14 1631 01/16/14 2203 01/17/14 0542 01/17/14 1118  GLUCAP 143* 90 138* 220* 211*    Iron Studies:  Recent Labs Lab 01/15/14 2335  IRON 37*  TIBC 217*  FERRITIN 97    Xrays/Other Studies: Dg Chest 2 View  01/15/2014   CLINICAL DATA:  Short of breath, leg swelling  EXAM: CHEST  2 VIEW  COMPARISON:  Prior chest x-ray 10/16/2011  FINDINGS: Increased cardiomegaly. Pulmonary vascular congestion with mild interstitial edema. Bibasilar opacities favored to reflect dependent edema and atelectasis. No pneumothorax. No large pleural effusion. No acute osseous abnormality.  IMPRESSION: Mild-moderate CHF.   Electronically Signed   By: Jacqulynn Cadet M.D.   On: 01/15/2014 18:56     Assessment/Plan: 1.  AKI/CKD in setting of acute decompensated diastolic CHF and IV diuresis with concomitant ARB therapy.  She has had progressive CKD over the last 2 years and now with worsening salt and water excess as well as ACDz and  SHPTH. 1. Scr has risen with IV lasix, however she is overtly volume overloaded and will need to resume IV lasix 80mg  bid and follow 2. We discussed the chronicity and severity of her CKD and will need to pursue vein mapping and access placement as well as education about her treatment options 2. Acute on chronic CHF, diastolic- responding to IV diuresis and will need to resume as her UOP has significantly dropped off after stopping IV lasix. 1. Na restriction as well as fluid restriction (neither of which she had been doing at home) 2. Recommend dietician consult for weight loss as well as low Na diet 3. HTN- better post diuresis 4. DM- per primary svc 5. Obesity- will need dietician evaluation/education. 6. Anemia of chronic disease- Hgb has been trending down since January of 2014 and was 9.9 in Sept 2014.   1. Will check iron stores and start ESA once CHF is under better control 7. SHPTH- has had an elevated iPTH of 149 in 1/14, will recheck but will likely require po vit D 8. Vascular access- pt has had progressive CKD and will need to be evaluated for permanent access as well as view educational videos to help her understand her options for RRT.  Currently no acute indication to initiate dialysis.    Misty Rago A 01/17/2014, 2:49 PM

## 2014-01-17 NOTE — Progress Notes (Signed)
Echo Lab  2D Echocardiogram completed.  [place], RDCS 01/17/2014 10:49 AM

## 2014-01-17 NOTE — Consult Note (Addendum)
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 She is obviously dyspneic and diuresis is going to be hard without dialysis. We could certainly try IV lasix infusion. Will wait on nephrology input.   I will review the echo done yesterday (not assigned to me).   CARDIOLOGY CONSULT NOTE  Patient ID: Jacqueline [name] MRN: [id number] DOB/AGE: 70-Feb-1947 70 y.o.  Admit date: 01/15/2014 Referring Physician  Barton Dubois, MD Primary Physician:  Salena Saner., MD Reason for Consultation  CHF  HPI: [name] is a 68 year old African-American female with history of uncontrolled diabetes with renal manifestation, hypertension and hyperlipidemia who was admitted via emergency room over the weekend with worsening shortness of breath.  Patient states that she had been doing well until about a month ago she started to notice worsening shortness of breath and dyspnea on exertion.  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  She denies any associated chest pain or chest heaviness or palpitations.  Patient also complains of being markedly tired and weak.  She has also noticed leg edema but states that this is chronic.  Patient is very weak and fatigued and does not speak much except stating that she is doing well presently. Patient essentially states that she is doing well and very much denies any significant symptoms.  I had seen her sometime in August 2014, and even at that time she had denied any symptoms and only on frequent questioning, she had suggested that she has mild dyspnea.  Her husband is present at the bedside.  Past Medical History  Diagnosis Date  . Hypertension   . Diabetes mellitus   . Hyperlipidemia   . Obesity      Past Surgical History  Procedure Laterality Date   . Abdominal hysterectomy       Family History  Problem Relation Age of Onset  . Diabetes Mother   . CAD Father   . CAD Sister      Social History: History   Social History  . Marital Status: Married    Spouse Name: N/A    Number of Children: N/A  . Years of Education: N/A   Occupational History  . Not on file.   Social History Main Topics  . Smoking status: Never Smoker   . Smokeless tobacco: Never Used  . Alcohol Use: No  . Drug Use: No  . Sexual Activity: No   Other Topics Concern  . Not on file   Social History Narrative  . No narrative on file     Prescriptions prior to admission  Medication Sig Dispense Refill  . albuterol (PROVENTIL HFA;VENTOLIN HFA) 108 (90 BASE) MCG/ACT inhaler Inhale 2 puffs into the lungs every 6 (six) hours as needed for wheezing or shortness of breath.      Marland Kitchen amLODipine-olmesartan (AZOR) 5-40 MG per tablet Take 1 tablet by mouth daily.        Marland Kitchen b complex vitamins capsule Take 1 capsule by mouth daily.      . cholecalciferol (VITAMIN D) 1000 UNITS tablet Take 2,000 Units by mouth daily.      . fish oil-omega-3 fatty acids 1000 MG capsule Take 1 g by mouth daily.        . Garlic [id number] MG CAPS Take 2 capsules by mouth  daily.        . glipiZIDE (GLUCOTROL) 10 MG tablet Take 10 mg by mouth 2 (two) times daily before a meal.        . magnesium 30 MG tablet Take 30 mg by mouth 2 (two) times daily.      . nebivolol (BYSTOLIC) 10 MG tablet Take 10 mg by mouth daily.        Marland Kitchen OVER THE COUNTER MEDICATION Gout out herbal. 1 tablet once daily      . polysaccharide iron (NIFEREX) 150 MG CAPS capsule Take 150 mg by mouth daily.        . potassium chloride (KLOR-CON) 20 MEQ packet Take 20 mEq by mouth daily.        . pravastatin (PRAVACHOL) 20 MG tablet Take 20 mg by mouth at bedtime.        . prazosin (MINIPRESS) 5 MG capsule Take 10 mg by mouth at bedtime.        . torsemide (DEMADEX) 10 MG tablet Take 2 tablets (20 mg total) by mouth 2 (two) times  daily.  30 tablet  0    Scheduled Meds: . amLODipine  5 mg Oral Daily  . B-complex with vitamin C  1 tablet Oral Daily  . cholecalciferol  2,000 Units Oral Daily  . enoxaparin (LOVENOX) injection  30 mg Subcutaneous Q24H  . glipiZIDE  10 mg Oral BID AC  . insulin aspart  0-5 Units Subcutaneous QHS  . insulin aspart  0-9 Units Subcutaneous TID WC  . iron polysaccharides  150 mg Oral Daily  . isosorbide-hydrALAZINE  1 tablet Oral BID  . nebivolol  10 mg Oral Daily  . omega-3 acid ethyl esters  1 g Oral Daily  . prazosin  10 mg Oral QHS  . simvastatin  10 mg Oral q1800  . sodium chloride  3 mL Intravenous Q12H   Continuous Infusions:  PRN Meds:.sodium chloride, acetaminophen, albuterol, ondansetron (ZOFRAN) IV, sodium chloride  ROS: General: no fevers/chills/night sweats Eyes: no blurry vision, diplopia, or amaurosis ENT: no sore throat or hearing loss Resp: no cough, wheezing, or hemoptysis GI: no abdominal pain, nausea, vomiting, diarrhea, or constipation Skin: no rash Neuro: no headache, numbness, tingling, or weakness of extremities Musculoskeletal: no joint pain or swelling Heme: no bleeding, DVT, or easy bruising Endo: no polydipsia or polyuria. Other systems negative.    Physical Exam: Blood pressure 143/54, pulse 66, temperature 98.7 F (37.1 C), temperature source Oral, resp. rate 17, weight 147.3 kg (324 lb 11.8 oz), SpO2 92.00%.   General: Well built and Morbidly obese body habitus who is in mild respiratory distress, appears fatigued. Appears stated age. Alert Ox3.   There is no cyanosis. HEENT: normal limits. Difficult to make out JVD due to short neck.  CARDIAC EXAM: S1, S2 normal, no gallop present. No murmur. Distant heart sounds.  CHEST EXAM: No tenderness of chest wall. LUNGS: Clear to percuss and auscultate.  Decreased breath sounds at the bases.  ABDOMEN: Pannus present. No obvious hepatosplenomegaly. BS normal in all 4 quadrants. Abdomen is  non-tender.   EXTREMITY: Full range of movementes, 2+ below the knee pitting non-tender edema. MUSCULOSKELETAL EXAM: Intact with full range of motion in all 4 extremities.   NEUROLOGIC EXAM: Grossly intact without any focal deficits. Alert O x 3.   VASCULAR EXAM: No skin breakdown. Carotids normal. Extremities: Femoral pulse distant and feeble without bruit. Popliteal pulse feeble; Pedal pulse normal. Otherwise No obvious prominent pulse felt in the  abdomen. No varicose veins.  Labs: HbA1c 01/16/2014:  7.3%   BNP (last 3 results)  Recent Labs  01/15/14 1730  PROBNP 4646.0*   Cardiac Panel (last 3 results) No results found for this basename: CKTOTAL, CKMB, TROPONINI, RELINDX,  in the last 72 hours  Lab Results  Component Value Date   WBC 10.4 01/15/2014   HGB 9.0* 01/15/2014   HCT 28.9* 01/15/2014   MCV 92.3 01/15/2014   PLT 294 01/15/2014    Recent Labs Lab 01/17/14 0525  NA 139  K 5.3  CL 100  CO2 27  BUN 60*  CREATININE 3.89*  CALCIUM 8.2*  GLUCOSE 240*   EKG 01/15/2013: Normal sinus rhythm at rate of 70 bpm, rightward axis, poor R-wave progression, borderline low voltage complexes.  Radiology: Dg Chest 2 View  01/15/2014   CLINICAL DATA:  Short of breath, leg swelling  EXAM: CHEST  2 VIEW  COMPARISON:  Prior chest x-ray 10/16/2011  FINDINGS: Increased cardiomegaly. Pulmonary vascular congestion with mild interstitial edema. Bibasilar opacities favored to reflect dependent edema and atelectasis. No pneumothorax. No large pleural effusion. No acute osseous abnormality.  IMPRESSION: Mild-moderate CHF.   Electronically Signed   By: Jacqulynn Cadet M.D.   On: 01/15/2014 18:56   Echocardiogram 01/17/2013: Normal left ventricular systolic function, no significant diastolic dysfunction.No significant valvular abnormality, right ventricle appears to be mildly dilated with preserved right ventricular function.  ASSESSMENT AND PLAN:  1.  Acute on chronic renal failure,  stage IV 2.  Acute on chronic diastolic heart failure secondary to hypertension, morbid obesity, acute on chronic renal failure and diabetes mellitus. 3.  Morbid obesity, patient denies symptoms to suggest sleep apnea 4.  Hypertension 5.  Hyperlipidemia 6.  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  Recommendation: Patient presently has responded to IV diuretics, but not significantly. I have recommended that he obtain nephrology consultation to see whether she should be on hemodialysis.  Patient was opposed to any kind of dialysis, but I have discussed with the patient and her husband at the bedside that this may be the only option.  It is surprising that she has been functional up until now in spite of her multiple medical comorbidities.  On further questioning, patient and her husband do not Cook at home, the eats outside every day, they go to [employer] and also cafeterias.  Have poor eating habits.  I have discussed regarding these issues with the patient and her husband, however given an acute situation, I'll wait for nephrology input before making any changes to her therapy.  However I'll give her one dose of Zaroxolyn to see whether she would have improved  Urinary output.  In spite of acute decompensated congestive heart failure, serum troponin has been negative.  No significant ischemic changes on the EKG.   Laverda Page, MD 01/17/2014, 2:11 PM [facility] [place] Pager: [phone] Office: [phone] If no answer Cell [phone]

## 2014-01-17 NOTE — Progress Notes (Signed)
[facility] PROGRESS NOTE  Filed Weights   01/15/14 2119 01/16/14 0507 01/17/14 0641  Weight: 147.737 kg (325 lb 11.2 oz) 146.194 kg (322 lb 4.8 oz) 147.3 kg (324 lb 11.8 oz)        Intake/Output Summary (Last 24 hours) at 01/17/14 1851 Last data filed at 01/17/14 1831  Gross per 24 hour  Intake    720 ml  Output    600 ml  Net    120 ml     Assessment/Plan: 1-acute respiratory failure with hypoxia secondary to acute on chronic diastolic heart failure: -Will continue IV Lasix, low-sodium diet, strict intake and output, daily weights -Continue beta blockers and BiDil -Closed followup to renal function and electrolytes -Cardiology has been consulted and will follow any further recommendations. -2-D echo with preserved EF and no wall motion abnormalities -patient still massively fluid overload.  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 -if she failed diuresis or become uremic will required HD -still massively fluid overload  3-Hypertension: Continue current medication and follow blood pressure for further adjustment.  4-Diabetes mellitus: A1c 7.3. -Continue sliding scale insulin and glipizide -Patient following a low carbohydrates diet    5-Anemia: Appears to be anemia of chronic disease. -Will follow anemia panel -procrit and iron as per renal discretion  6-hyperlipidemia: Continue statins.   DVT: Lovenox    Code Status: Full Family Communication: No family at bedside Disposition Plan: Home when medical stable   Consultants:  Dr. Einar Gip (cardiology)  Dr. Arty Baumgartner (renal service)  Procedures: ECHO:  - Left ventricle: The cavity size was normal. There was mild concentric hypertrophy. Systolic function was normal. The estimated ejection fraction was  in the range of 55% to 60%. Wall motion was normal; there were no regional wall motion abnormalities. Left ventricular diastolic function parameters were normal. - Mitral valve: Calcified annulus. - Left atrium: The atrium was mildly dilated. - Right ventricle: The cavity size was mildly dilated. Wall thickness was normal. - Right atrium: The atrium was mildly dilated. - Atrial septum: No defect or patent foramen ovale was identified.  Antibiotics:  None  HPI/Subjective: Patient denies CP; reports some improvement in her breathing; still massively fluid overload.  Objective: Filed Vitals:   01/16/14 2054 01/17/14 0641 01/17/14 0921 01/17/14 1416  BP: 170/52 136/53 143/54 133/52  Pulse: 69 73 66 62  Temp: 98.2 F (36.8 C) 98.7 F (37.1 C)  97.9 F (36.6 C)  TempSrc: Oral Oral  Oral  Resp: 18 17  18   Weight:  147.3 kg (324 lb 11.8 oz)    SpO2: 95% 92%  96%     Exam:  General: Alert, awake, oriented x3, in no acute distress.  HEENT: No bruits, no goiter. Positive JVD Heart: Regular rate, no rubs or gallops; 3+ edema bilaterally Lungs: Decreased breath sounds bilaterally, positive crackles Abdomen: Soft, nontender, nondistended, positive bowel sounds.  Neuro: Grossly intact, nonfocal.   Data Reviewed: Basic Metabolic Panel:  Recent Labs Lab 01/15/14 1730 01/16/14 0400 01/17/14 0525  NA 143 146 139  K 4.5 5.0 5.3  CL 102 105 100  CO2 26 27 27   GLUCOSE 199* 169* 240*  BUN 53* 54* 60*  CREATININE 3.33* 3.38* 3.89*  CALCIUM 8.4 8.4 8.2*   CBC:  Recent Labs Lab 01/15/14 1730  WBC 10.4  NEUTROABS 8.0*  HGB 9.0*  HCT 28.9*  MCV 92.3  PLT 294   BNP (last 3 results)  Recent Labs  01/15/14 1730  PROBNP 4646.0*   CBG:  Recent Labs Lab 01/16/14 1631 01/16/14 2203 01/17/14 0542 01/17/14 1118 01/17/14 1615  GLUCAP 90 138* 220* 211* 140*    Studies: Dg Chest 2 View  01/15/2014   CLINICAL DATA:  Short of breath, leg swelling  EXAM: CHEST  2  VIEW  COMPARISON:  Prior chest x-ray 10/16/2011  FINDINGS: Increased cardiomegaly. Pulmonary vascular congestion with mild interstitial edema. Bibasilar opacities favored to reflect dependent edema and atelectasis. No pneumothorax. No large pleural effusion. No acute osseous abnormality.  IMPRESSION: Mild-moderate CHF.   Electronically Signed   By: Jacqulynn Cadet M.D.   On: 01/15/2014 18:56    Scheduled Meds: . amLODipine  5 mg Oral Daily  . B-complex with vitamin C  1 tablet Oral Daily  . cholecalciferol  2,000 Units Oral Daily  . enoxaparin (LOVENOX) injection  30 mg Subcutaneous Q24H  . furosemide  80 mg Intravenous BID  . glipiZIDE  10 mg Oral BID AC  . insulin aspart  0-5 Units Subcutaneous QHS  . insulin aspart  0-9 Units Subcutaneous TID WC  . iron polysaccharides  150 mg Oral Daily  . isosorbide-hydrALAZINE  1 tablet Oral BID  . nebivolol  10 mg Oral Daily  . omega-3 acid ethyl esters  1 g Oral Daily  . prazosin  10 mg Oral QHS  . simvastatin  10 mg Oral q1800  . sodium chloride  3 mL Intravenous Q12H   Time > 30 minutes  Vinaya Sancho  [facility] Pager [phone]. If 8PM-8AM, please contact night-coverage at [email or web address], password [facility] 01/17/2014, 6:51 PM  LOS: 2 days

## 2014-01-18 DIAGNOSIS — Z01810 Encounter for preprocedural cardiovascular examination: Secondary | ICD-10-CM

## 2014-01-18 DIAGNOSIS — N185 Chronic kidney disease, stage 5: Secondary | ICD-10-CM

## 2014-01-18 DIAGNOSIS — R0602 Shortness of breath: Secondary | ICD-10-CM

## 2014-01-18 LAB — BLOOD GAS, ARTERIAL
ACID-BASE EXCESS: 1.1 mmol/L (ref 0.0–2.0)
Bicarbonate: 26.6 mEq/L — ABNORMAL HIGH (ref 20.0–24.0)
DRAWN BY: 225631
O2 Content: 2 L/min
O2 SAT: 96.6 %
PATIENT TEMPERATURE: 98.6
PO2 ART: 82.9 mmHg (ref 80.0–100.0)
TCO2: 28.3 mmol/L (ref 0–100)
pCO2 arterial: 54.4 mmHg — ABNORMAL HIGH (ref 35.0–45.0)
pH, Arterial: 7.311 — ABNORMAL LOW (ref 7.350–7.450)

## 2014-01-18 LAB — CBC
HCT: 26.6 % — ABNORMAL LOW (ref 36.0–46.0)
HEMOGLOBIN: 8 g/dL — AB (ref 12.0–15.0)
MCH: 28.7 pg (ref 26.0–34.0)
MCHC: 30.1 g/dL (ref 30.0–36.0)
MCV: 95.3 fL (ref 78.0–100.0)
PLATELETS: 295 10*3/uL (ref 150–400)
RBC: 2.79 MIL/uL — AB (ref 3.87–5.11)
RDW: 16 % — ABNORMAL HIGH (ref 11.5–15.5)
WBC: 10.7 10*3/uL — ABNORMAL HIGH (ref 4.0–10.5)

## 2014-01-18 LAB — BASIC METABOLIC PANEL
BUN: 73 mg/dL — AB (ref 6–23)
CO2: 28 mEq/L (ref 19–32)
Calcium: 7.7 mg/dL — ABNORMAL LOW (ref 8.4–10.5)
Chloride: 101 mEq/L (ref 96–112)
Creatinine, Ser: 4.41 mg/dL — ABNORMAL HIGH (ref 0.50–1.10)
GFR, EST AFRICAN AMERICAN: 11 mL/min — AB (ref >90)
GFR, EST NON AFRICAN AMERICAN: 9 mL/min — AB (ref >90)
Glucose, Bld: 118 mg/dL — ABNORMAL HIGH (ref 70–99)
Potassium: 5 mEq/L (ref 3.7–5.3)
SODIUM: 142 meq/L (ref 137–147)

## 2014-01-18 LAB — GLUCOSE, CAPILLARY
Glucose-Capillary: 221 mg/dL — ABNORMAL HIGH (ref 70–99)
Glucose-Capillary: 197 mg/dL — ABNORMAL HIGH (ref 70–99)
Glucose-Capillary: 137 mg/dL — ABNORMAL HIGH (ref 70–99)
Glucose-Capillary: 111 mg/dL — ABNORMAL HIGH (ref 70–99)
Glucose-Capillary: 78 mg/dL (ref 70–99)

## 2014-01-18 LAB — RENAL FUNCTION PANEL
Albumin: 3.1 g/dL — ABNORMAL LOW (ref 3.5–5.2)
BUN: 67 mg/dL — ABNORMAL HIGH (ref 6–23)
CO2: 26 meq/L (ref 19–32)
Calcium: 7.9 mg/dL — ABNORMAL LOW (ref 8.4–10.5)
Chloride: 100 mEq/L (ref 96–112)
Creatinine, Ser: 4.31 mg/dL — ABNORMAL HIGH (ref 0.50–1.10)
GFR, EST AFRICAN AMERICAN: 11 mL/min — AB (ref >90)
GFR, EST NON AFRICAN AMERICAN: 10 mL/min — AB (ref >90)
Glucose, Bld: 227 mg/dL — ABNORMAL HIGH (ref 70–99)
POTASSIUM: 5.7 meq/L — AB (ref 3.7–5.3)
Phosphorus: 5.9 mg/dL — ABNORMAL HIGH (ref 2.3–4.6)
SODIUM: 141 meq/L (ref 137–147)

## 2014-01-18 LAB — VITAMIN B12: Vitamin B-12: 477 pg/mL (ref 211–911)

## 2014-01-18 LAB — FERRITIN: Ferritin: 78 ng/mL (ref 10–291)

## 2014-01-18 LAB — FOLATE RBC: RBC Folate: 1546 ng/mL — ABNORMAL HIGH (ref >280)

## 2014-01-18 MED ORDER — SODIUM POLYSTYRENE SULFONATE 15 GM/60ML PO SUSP
15.0000 g | Freq: Once | ORAL | Status: AC
  Administered 2014-01-18: 15 g via ORAL
  Filled 2014-01-18: qty 60

## 2014-01-18 MED ORDER — SODIUM CHLORIDE 0.9 % IV SOLN
1020.0000 mg | Freq: Once | INTRAVENOUS | Status: AC
  Administered 2014-01-18: 1020 mg via INTRAVENOUS
  Filled 2014-01-18 (×2): qty 34

## 2014-01-18 MED ORDER — FUROSEMIDE 10 MG/ML IJ SOLN
160.0000 mg | Freq: Three times a day (TID) | INTRAVENOUS | Status: DC
  Administered 2014-01-18 – 2014-01-23 (×13): 160 mg via INTRAVENOUS
  Filled 2014-01-18 (×16): qty 16

## 2014-01-18 MED ORDER — DEXTROSE 5 % IV SOLN
3.0000 g | INTRAVENOUS | Status: AC
  Administered 2014-01-19: 3 g via INTRAVENOUS
  Filled 2014-01-18: qty 3000

## 2014-01-18 MED ORDER — CEFAZOLIN SODIUM 1-5 GM-% IV SOLN
1.0000 g | INTRAVENOUS | Status: DC
  Filled 2014-01-18: qty 50

## 2014-01-18 MED ORDER — METOLAZONE 2.5 MG PO TABS
2.5000 mg | ORAL_TABLET | Freq: Every day | ORAL | Status: DC
  Administered 2014-01-18 – 2014-01-24 (×5): 2.5 mg via ORAL
  Filled 2014-01-18 (×7): qty 1

## 2014-01-18 NOTE — Plan of Care (Signed)
Problem: Phase I Progression Outcomes Goal: EF % per last Echo/documented,Core Reminder form on chart Outcome: Completed/Met Date Met:  01/18/14 EF- 55-60% as of 01/17/2015

## 2014-01-18 NOTE — Progress Notes (Signed)
Unable to give iv feraheme due no iv dislodged.  Iv team attempted x1 unsuccessful and charge nurse Denequel RN attempted also unable to get access.  IV team Sarah, RN made aware and instructed will let oncoming shift be aware to attempt. Pt's nite nurse made aware.  Karie Kirks, Therapist, sports.

## 2014-01-18 NOTE — Consult Note (Signed)
[facility] [place]  Patient name: Jacqueline [name] MRN: [id number] DOB: 02-Mar-1946 Sex: female  REASON FOR CONSULT: Needs tunneled dialysis catheter and AV fistula or AV graft. Consult from Dr. Marval Regal.   HPI: Jacqueline [name] is a 68 y.o. female who was admitted on 01/15/2014 with shortness of breath and swelling in both lower extremities. I am obtaining the history mostly from the chart as the patient is somewhat lethargic. I am having a hard time keeping her awake. She was admitted with acute diastolic congestive heart failure. She also has a history of stage IV chronic kidney disease in her renal function was worsening. The patient was seen in consultation by nephrology. It is felt that she will likely need dialysis in the near future and we were asked to place a tunneled dialysis catheter and also long-term access.  For shortness of breath and swelling in her lower extremities has improved since admission and she has been diuresed. Her hemoglobin A1c was elevated on admission and it diabetes coordinator is working with the patient.   Past Medical History  Diagnosis Date  . Hypertension   . Diabetes mellitus   . Hyperlipidemia   . Obesity    Family History  Problem Relation Age of Onset  . Diabetes Mother   . CAD Father   . CAD Sister    SOCIAL HISTORY: History  Substance Use Topics  . Smoking status: Never Smoker   . Smokeless tobacco: Never Used  . Alcohol Use: No   Allergies  Allergen Reactions  . Sulfa Drugs Cross Reactors Other (See Comments)    Doesn't remember    REVIEW OF SYSTEMS: Pt is lethargic and I am unable to obtain a ROS at this time.    PHYSICAL EXAM: Filed Vitals:   01/17/14 1958 01/17/14 2207 01/18/14 0549 01/18/14 1010  BP: 156/56  128/57 123/96  Pulse: 68  78 68  Temp: 98.8 F (37.1 C)  100 F (37.8 C)   TempSrc: Oral  Oral   Resp: 18  21   Height:  5\' 5"  (1.651 m)    Weight:   326 lb 4.5 oz (148 kg)   SpO2: 97%   100%    Body mass index is 54.3 kg/(m^2). GENERAL: The patient is an obese  female, who is somewhat lethargic. The vital signs are documented above. CARDIOVASCULAR: There is a regular rate and rhythm. No carotid bruits. Palpable radial pulses bilaterally.  PULMONARY: There is good air exchange bilaterally without wheezing or rales. ABDOMEN: Obese and difficult to assess.  MUSCULOSKELETAL: There are no major deformities or cyanosis. NEUROLOGIC: No focal weakness or paresthesias are detected. SKIN: There are no ulcers or rashes noted. Ecchymosis in left antecubital space from previous IV. There is an IV in the right antecubital vein.   DATA:  Lab Results  Component Value Date   WBC 10.7* 01/18/2014   HGB 8.0* 01/18/2014   HCT 26.6* 01/18/2014   MCV 95.3 01/18/2014   PLT 295 01/18/2014   Lab Results  Component Value Date   NA 141 01/18/2014   K 5.7* 01/18/2014   CL 100 01/18/2014   CO2 26 01/18/2014   Lab Results  Component Value Date   CREATININE 4.31* 01/18/2014   No results found for this basename: INR, PROTIME   Lab Results  Component Value Date   HGBA1C 7.3* 01/16/2014   CBG (last 3)   Recent Labs  01/18/14 0154 01/18/14 0632 01/18/14 1156  GLUCAP 221*  197* 137*   2-D echo: Normal systolic function. Ejection fraction estimated to be 55-60%.  Vein map is pending and has been ordered.  MEDICAL ISSUES:  Stage V chronic kidney disease: the patient is left-handed. Her vein mapping is pending but I have ordered this. I have scheduled her for placement of a tunneled dialysis catheter and left AV fistula or AV graft for tomorrow by Dr. Kellie Simmering. I had a difficult time keeping her awake but was able to keep her awake long enough to explain the procedure to her. I have explained the indications for placement of an AV fistula or AV graft. I've explained that if at all possible we will place an AV fistula.  I have reviewed the risks of placement of an AV fistula including but not limited  to: failure of the fistula to mature, need for subsequent interventions, and thrombosis. In addition I have reviewed the potential complications of placement of an AV graft. These risks include, but are not limited to, graft thrombosis, graft infection, wound healing problems, bleeding, arm swelling, and steal syndrome. All the patient's questions were answered and they are agreeable to proceed with surgery.  I have spoken to Dr. Marval Regal on the phone who is in agreement with this plan.  [place] [facility] [place] Beeper: [phone]

## 2014-01-18 NOTE — Progress Notes (Addendum)
Inpatient Diabetes Program Recommendations  AACE/ADA: New Consensus Statement on Inpatient Glycemic Control (2013)  Target Ranges:  Prepandial:   less than 140 mg/dL      Peak postprandial:   less than 180 mg/dL (1-2 hours)      Critically ill patients:  140 - 180 mg/dL   Results for Jacqueline [name], Jacqueline [name] (MRN [id number]) as of 01/18/2014 11:47  Ref. Range 01/17/2014 05:42 01/17/2014 11:18 01/17/2014 16:15 01/17/2014 21:39 01/18/2014 01:54 01/18/2014 06:32  Glucose-Capillary Latest Range: 70-99 mg/dL 220 (H) 211 (H) 140 (H) 191 (H) 221 (H) 197 (H)    Diabetes history: DM2 Outpatient Diabetes medications: Glipizide 10 mg BID Current orders for Inpatient glycemic control: Glipizide 10 mg BID, Novolog 0-9 units TID with meals, and Novolog 0-5 units HS  Inpatient Diabetes Program Recommendations Correction (SSI): Please consider increasing Novolog correction to moderate scale.  Thanks, Barnie Alderman, RN, MSN, CCRN Diabetes Coordinator Inpatient Diabetes Program [phone] (Team Pager) [phone] ([place] office) [phone] [facility] office)

## 2014-01-18 NOTE — Progress Notes (Signed)
Subjective:  Patient states that she's doing well and her dyspnea has improved. Continues to have leg edema. Denies any chest pain, palpitations or dizziness. Sitting on the side of the bed.  Objective:  Vital Signs in the last 24 hours: Temp:  [98.8 F (37.1 C)-100 F (37.8 C)] 98.9 F (37.2 C) (02/17 1300) Pulse Rate:  [68-78] 69 (02/17 1300) Resp:  [18-21] 20 (02/17 1300) BP: (123-156)/(49-96) 125/49 mmHg (02/17 1300) SpO2:  [97 %-100 %] 100 % (02/17 1300) Weight:  [148 kg (326 lb 4.5 oz)] 148 kg (326 lb 4.5 oz) (02/17 0549)  Intake/Output from previous day: 02/16 0701 - 02/17 0700 In: 31 [P.O.:960] Out: 450 [Urine:450]  Physical Exam: General: Well built and Morbidly obese body habitus who is in mild respiratory distress, appears fatigued. Appears stated age. Alert Ox3.  There is no cyanosis. HEENT: normal limits. Difficult to make out JVD due to short neck.  CARDIAC EXAM: S1, S2 normal, no gallop present. No murmur. Distant heart sounds.  CHEST EXAM: No tenderness of chest wall. LUNGS: Clear to percuss and auscultate. Decreased breath sounds at the bases.  ABDOMEN: Large pannus present. No obvious hepatosplenomegaly. BS normal in all 4 quadrants. Abdomen is non-tender.  EXTREMITY: Full range of movementes, 2+ below the knee pitting non-tender edema. MUSCULOSKELETAL EXAM: Intact with full range of motion in all 4 extremities.  NEUROLOGIC EXAM: Grossly intact without any focal deficits. Alert O x 3.  VASCULAR EXAM: No skin breakdown. Carotids normal. Extremities: Femoral pulse distant and feeble without bruit. Popliteal pulse feeble; Pedal pulse faint and difficult to feel due to edema. Otherwise No obvious prominent pulse felt in the abdomen. No varicose veins.   Lab Results:  Recent Labs  01/15/14 1730 01/18/14 0430  WBC 10.4 10.7*  HGB 9.0* 8.0*  PLT 294 295    Recent Labs  01/17/14 0525 01/18/14 0430  NA 139 141  K 5.3 5.7*  CL 100 100  CO2 27 26  GLUCOSE  240* 227*  BUN 60* 67*  CREATININE 3.89* 4.31*   No results found for this basename: TROPONINI, CK, MB,  in the last 72 hours Hepatic Function Panel  Recent Labs  01/18/14 0430  ALBUMIN 3.1*   No results found for this basename: CHOL,  in the last 72 hours No results found for this basename: PROTIME,  in the last 72 hours Lipid Panel  No results found for this basename: chol, trig, hdl, cholhdl, vldl, ldlcalc    Scheduled Meds: . amLODipine  5 mg Oral Daily  . B-complex with vitamin C  1 tablet Oral Daily  . [START ON 01/19/2014]  ceFAZolin (ANCEF) IV  1 g Intravenous On Call  . cholecalciferol  2,000 Units Oral Daily  . enoxaparin (LOVENOX) injection  30 mg Subcutaneous Q24H  . ferumoxytol  1,020 mg Intravenous Once  . furosemide  160 mg Intravenous 3 times per day  . glipiZIDE  10 mg Oral BID AC  . insulin aspart  0-5 Units Subcutaneous QHS  . insulin aspart  0-9 Units Subcutaneous TID WC  . iron polysaccharides  150 mg Oral Daily  . isosorbide-hydrALAZINE  1 tablet Oral BID  . metolazone  2.5 mg Oral Daily  . nebivolol  10 mg Oral Daily  . omega-3 acid ethyl esters  1 g Oral Daily  . prazosin  10 mg Oral QHS  . simvastatin  10 mg Oral q1800  . sodium chloride  3 mL Intravenous Q12H   Continuous Infusions:  PRN Meds:.sodium chloride, acetaminophen, albuterol, ondansetron (ZOFRAN) IV, sodium chloride   Cardiac Studies: Echocardiogram 01/17/2013: Normal left ventricular systolic function, no significant diastolic dysfunction.No significant valvular abnormality, right ventricle appears to be mildly dilated with preserved right ventricular function.   EKG 01/15/2013: Normal sinus rhythm at rate of 70 bpm, rightward axis, poor R-wave progression, borderline low voltage complexes.  Assessment/Plan:  1. Acute on chronic diastolic heart failure secondary to hypertension, morbid obesity, acute on chronic renal failure and diabetes mellitus.  2. Acute on chronic renal failure,  diabetic nephropathy. 3. Morbid obesity, patient denies symptoms to suggest sleep apnea  4. Hypertension  5. Hyperlipidemia  6. Uncontrolled DM.  Recommendation: I discussed the presentation of the patient with Dr. Donato Heinz, we will try Zaroxolyn today, but hopes of the uterus this is very minimal, patient in stage V chronic kidney disease, and will probably need hemodialysis in the inpatient setting if she does not respond. I will continue to follow sidelines, from cardiac standpoint no changes in her medications at this point. Cannot use ACE inhibitors or ARB's due to renal failure. I have discussed with the patient regarding need for dialysis with it is now or in future patient now appears to be agreeable.,     Laverda Page, M.D. 01/18/2014, 3:55 PM [facility], [place] Pager: [phone] Office: [phone] If no answer: [phone]

## 2014-01-18 NOTE — Progress Notes (Signed)
[facility] PROGRESS NOTE  Filed Weights   01/16/14 0507 01/17/14 0641 01/18/14 0549  Weight: 146.194 kg (322 lb 4.8 oz) 147.3 kg (324 lb 11.8 oz) 148 kg (326 lb 4.5 oz)        Intake/Output Summary (Last 24 hours) at 01/18/14 2058 Last data filed at 01/18/14 2054  Gross per 24 hour  Intake   1000 ml  Output    976 ml  Net     24 ml     Assessment/Plan: 1-acute respiratory failure with hypoxia secondary to acute on chronic diastolic heart failure: -minimally diuresis, even she felt slightly better and reports improve in breathing. Patient remained massively fluid overload -Cr also worsening everyday -plan is for 160mg  lasix TID and metolazone; if no improvement in volume and renal function, she will go to into HD -VVS consulted by renal service for vein mapping and diatek access placement. 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  4-Diabetes mellitus: A1c 7.3. -Continue sliding scale insulin and glipizide -Patient following a low carbohydrates diet    5-Anemia: Appears to be anemia of chronic disease. -procrit and iron as per renal discretion  6-hyperlipidemia: Continue statins.   DVT: Lovenox    Code Status: Full Family Communication: No family at bedside Disposition Plan: Home when medical stable   Consultants:  Dr. Einar Gip  (cardiology)  Dr. Arty Baumgartner (renal service)  Procedures: ECHO:  - Left ventricle: The cavity size was normal. There was mild concentric hypertrophy. Systolic function was normal. The estimated ejection fraction was in the range of 55% to 60%. Wall motion was normal; there were no regional wall motion abnormalities. Left ventricular diastolic function parameters were normal. - Mitral valve: Calcified annulus. - Left atrium: The atrium was mildly dilated. - Right ventricle: The cavity size was mildly dilated. Wall thickness was normal. - Right atrium: The atrium was mildly dilated. - Atrial septum: No defect or patent foramen ovale was identified.  Antibiotics:  None  HPI/Subjective: Patient denies CP, per patient slightly improvement in her breathing; still massively fluid overload.  Objective: Filed Vitals:   01/17/14 2207 01/18/14 0549 01/18/14 1010 01/18/14 1300  BP:  128/57 123/96 125/49  Pulse:  78 68 69  Temp:  100 F (37.8 C)  98.9 F (37.2 C)  TempSrc:  Oral  Oral  Resp:  21  20  Height: 5\' 5"  (1.651 m)     Weight:  148 kg (326 lb 4.5 oz)    SpO2:  100%  100%     Exam:  General: Alert, awake, oriented x3, in no acute distress.  HEENT: No bruits, no goiter. Positive JVD Heart: Regular rate, no rubs or gallops; 3+ edema bilaterally Lungs: Decreased breath sounds bilaterally, positive crackles Abdomen: Soft, nontender, nondistended, positive bowel sounds.  Neuro: Grossly intact, nonfocal.   Data Reviewed: Basic Metabolic Panel:  Recent Labs Lab 01/15/14 1730 01/16/14 0400 01/17/14 0525 01/18/14 0430 01/18/14 1620  NA 143 146 139  141 142  K 4.5 5.0 5.3 5.7* 5.0  CL 102 105 100 100 101  CO2 26 27 27 26 28   GLUCOSE 199* 169* 240* 227* 118*  BUN 53* 54* 60* 67* 73*  CREATININE 3.33* 3.38* 3.89* 4.31* 4.41*  CALCIUM 8.4 8.4 8.2* 7.9* 7.7*  PHOS  --   --   --  5.9*  --    CBC:  Recent Labs Lab 01/15/14 1730 01/18/14 0430  WBC 10.4 10.7*   NEUTROABS 8.0*  --   HGB 9.0* 8.0*  HCT 28.9* 26.6*  MCV 92.3 95.3  PLT 294 295   BNP (last 3 results)  Recent Labs  01/15/14 1730  PROBNP 4646.0*   CBG:  Recent Labs Lab 01/17/14 2139 01/18/14 0154 01/18/14 0632 01/18/14 1156 01/18/14 1619  GLUCAP 191* 221* 197* 137* 111*    Studies: No results found.  Scheduled Meds: . amLODipine  5 mg Oral Daily  . B-complex with vitamin C  1 tablet Oral Daily  . [START ON 01/19/2014]  ceFAZolin (ANCEF) IV  3 g Intravenous On Call to OR  . cholecalciferol  2,000 Units Oral Daily  . enoxaparin (LOVENOX) injection  30 mg Subcutaneous Q24H  . furosemide  160 mg Intravenous 3 times per day  . glipiZIDE  10 mg Oral BID AC  . insulin aspart  0-5 Units Subcutaneous QHS  . insulin aspart  0-9 Units Subcutaneous TID WC  . iron polysaccharides  150 mg Oral Daily  . isosorbide-hydrALAZINE  1 tablet Oral BID  . metolazone  2.5 mg Oral Daily  . nebivolol  10 mg Oral Daily  . omega-3 acid ethyl esters  1 g Oral Daily  . prazosin  10 mg Oral QHS  . simvastatin  10 mg Oral q1800  . sodium chloride  3 mL Intravenous Q12H   Time: < 30 minutes  Aurthur Wingerter  [facility] Pager [phone]. If 8PM-8AM, please contact night-coverage at [email or web address], password [facility] 01/18/2014, 8:58 PM  LOS: 3 days

## 2014-01-18 NOTE — Progress Notes (Signed)
Pt son Sharyn Dross  requesting to speak with MD  Regarding pt renal problem and having dialysis.  Dr. Dyann Kief currently in ED  made aware and instructed pt's son to leave his phone number and will have some MD on tomorrow to speak with him. Phone number placed in chart in computer.  Karie Kirks, Therapist, sports.

## 2014-01-18 NOTE — Progress Notes (Signed)
VASCULAR LAB PRELIMINARY  PRELIMINARY  PRELIMINARY  PRELIMINARY  Bilateral upper extremity venous duplex completed.    Preliminary report:  Please see "Results Review"---Upper extremity vein mapping for final results.   Dimond Crotty, RVT 01/18/2014, 5:41 PM

## 2014-01-18 NOTE — Progress Notes (Signed)
Patient ID: Jacqueline [name], female   DOB: 1946-01-20, 68 y.o.   MRN: [id number] S:very somnolent this am O:BP 123/96  Pulse 68  Temp(Src) 100 F (37.8 C) (Oral)  Resp 21  Ht 5\' 5"  (1.651 m)  Wt 148 kg (326 lb 4.5 oz)  BMI 54.30 kg/m2  SpO2 100%  Intake/Output Summary (Last 24 hours) at 01/18/14 1154 Last data filed at 01/18/14 0900  Gross per 24 hour  Intake    960 ml  Output    450 ml  Net    510 ml   Intake/Output: I/O last 3 completed shifts: In: 1200 [P.O.:1200] Out: 1050 [Urine:1050]  Intake/Output this shift:  Total I/O In: 240 [P.O.:240] Out: -  Weight change: 0.7 kg (1 lb 8.7 oz) [id number] AAF in NAD HS:[id number] HS, no rub Resp:decreased BS at bases [id number], +BS Ext:1+ edema   Recent Labs Lab 01/15/14 1730 01/16/14 0400 01/17/14 0525 01/18/14 0430  NA 143 146 139 141  K 4.5 5.0 5.3 5.7*  CL 102 105 100 100  CO2 26 27 27 26   GLUCOSE 199* 169* 240* 227*  BUN 53* 54* 60* 67*  CREATININE 3.33* 3.38* 3.89* 4.31*  ALBUMIN  --   --   --  3.1*  CALCIUM 8.4 8.4 8.2* 7.9*  PHOS  --   --   --  5.9*   Liver Function Tests:  Recent Labs Lab 01/18/14 0430  ALBUMIN 3.1*   No results found for this basename: LIPASE, AMYLASE,  in the last 168 hours No results found for this basename: AMMONIA,  in the last 168 hours CBC:  Recent Labs Lab 01/15/14 1730 01/18/14 0430  WBC 10.4 10.7*  NEUTROABS 8.0*  --   HGB 9.0* 8.0*  HCT 28.9* 26.6*  MCV 92.3 95.3  PLT 294 295   Cardiac Enzymes: No results found for this basename: CKTOTAL, CKMB, CKMBINDEX, TROPONINI,  in the last 168 hours CBG:  Recent Labs Lab 01/17/14 1118 01/17/14 1615 01/17/14 2139 01/18/14 0154 01/18/14 0632  GLUCAP 211* 140* 191* 221* 197*    Iron Studies:  Recent Labs  01/17/14 1715  IRON 30*  TIBC 218*  FERRITIN 78   Studies/Results: No results found. Marland Kitchen amLODipine  5 mg Oral Daily  . B-complex with vitamin C  1 tablet Oral Daily  . cholecalciferol  2,000 Units Oral  Daily  . enoxaparin (LOVENOX) injection  30 mg Subcutaneous Q24H  . furosemide  80 mg Intravenous BID  . glipiZIDE  10 mg Oral BID AC  . insulin aspart  0-5 Units Subcutaneous QHS  . insulin aspart  0-9 Units Subcutaneous TID WC  . iron polysaccharides  150 mg Oral Daily  . isosorbide-hydrALAZINE  1 tablet Oral BID  . nebivolol  10 mg Oral Daily  . omega-3 acid ethyl esters  1 g Oral Daily  . prazosin  10 mg Oral QHS  . simvastatin  10 mg Oral q1800  . sodium chloride  3 mL Intravenous Q12H    BMET    Component Value Date/Time   NA 141 01/18/2014 0430   K 5.7* 01/18/2014 0430   CL 100 01/18/2014 0430   CO2 26 01/18/2014 0430   GLUCOSE 227* 01/18/2014 0430   BUN 67* 01/18/2014 0430   CREATININE 4.31* 01/18/2014 0430   CALCIUM 7.9* 01/18/2014 0430   GFRNONAA 10* 01/18/2014 0430   GFRAA 11* 01/18/2014 0430   CBC    Component Value Date/Time   WBC 10.7* 01/18/2014 0430  RBC 2.79* 01/18/2014 0430   RBC 3.00* 01/15/2014 2335   HGB 8.0* 01/18/2014 0430   HCT 26.6* 01/18/2014 0430   PLT 295 01/18/2014 0430   MCV 95.3 01/18/2014 0430   MCH 28.7 01/18/2014 0430   MCHC 30.1 01/18/2014 0430   RDW 16.0* 01/18/2014 0430   LYMPHSABS 1.5 01/15/2014 1730   MONOABS 0.5 01/15/2014 1730   EOSABS 0.3 01/15/2014 1730   BASOSABS 0.0 01/15/2014 1730     Assessment/Plan:  1. AKI/CKD in setting of acute decompensated diastolic CHF and IV diuresis with concomitant ARB therapy. She has had progressive CKD over the last 2 years and now with worsening salt and water excess as well as ACDz and SHPTH.  1. Scr has risen with IV lasix and remains volume overloaded so will increase to 160mg  tid and add metolazone, however I am concerned that she will require HD if she does not respond to high dose diuretics. 2. We discussed the chronicity and severity of her CKD and will need to pursue vein mapping and access placement as well as education about her treatment options and VVS has been consulted for vein mapping, AVG  placement as well as HD catheter in the next 24 hours if she doesn't respond to increase in lasix 2. Acute on chronic CHF, diastolic- responding to IV diuresis and will need to resume as her UOP has significantly dropped off after stopping IV lasix.  1. Na restriction as well as fluid restriction (neither of which she had been doing at home) 2. Recommend dietician consult for weight loss as well as low Na diet 3. Nonadherent with low Na diet as well as fluid restriction 4. Question if she also has sleep apnea contributing to right-heart failure 3. HTN- better post diuresis 4. AMS- will order ABG to r/o hypercapnia as she has a high pretest probability of having sleep apnea 5. DM- per primary svc 6. Obesity- will need dietician evaluation/education. 7. Anemia of chronic disease- Hgb has been trending down since January of 2014 and was 9.9 in Sept 2014.  1. low iron stores will give feraheme 1020mg  2. start ESA once CHF is under better control or once we start HD 8. SHPTH- has had an elevated iPTH of 149 in 1/14, will recheck but will likely require po vit D 9. Vascular access- pt has had progressive CKD and will need to be evaluated for permanent access as well as view educational videos to help her understand her options for RRT. Currently no acute indication to initiate dialysis.  [place]

## 2014-01-19 ENCOUNTER — Inpatient Hospital Stay (HOSPITAL_COMMUNITY): Payer: Medicare Other | Admitting: Certified Registered"

## 2014-01-19 ENCOUNTER — Encounter (HOSPITAL_COMMUNITY): Payer: Self-pay | Admitting: Certified Registered"

## 2014-01-19 ENCOUNTER — Inpatient Hospital Stay (HOSPITAL_COMMUNITY): Payer: Medicare Other

## 2014-01-19 ENCOUNTER — Encounter (HOSPITAL_COMMUNITY): Payer: Medicare Other | Admitting: Certified Registered"

## 2014-01-19 ENCOUNTER — Encounter (HOSPITAL_COMMUNITY)
Admission: EM | Disposition: A | Discharge status: Skilled Nursing Facility | Payer: Self-pay | Source: Home / Self Care | Attending: Internal Medicine

## 2014-01-19 DIAGNOSIS — J9600 Acute respiratory failure, unspecified whether with hypoxia or hypercapnia: Secondary | ICD-10-CM

## 2014-01-19 DIAGNOSIS — G9340 Encephalopathy, unspecified: Secondary | ICD-10-CM

## 2014-01-19 HISTORY — PX: INSERTION OF DIALYSIS CATHETER: SHX1324

## 2014-01-19 LAB — POCT I-STAT 3, ART BLOOD GAS (G3+)
ACID-BASE DEFICIT: 1 mmol/L (ref 0.0–2.0)
ACID-BASE EXCESS: 2 mmol/L (ref 0.0–2.0)
Bicarbonate: 27.4 mEq/L — ABNORMAL HIGH (ref 20.0–24.0)
Bicarbonate: 29.1 mEq/L — ABNORMAL HIGH (ref 20.0–24.0)
O2 SAT: 97 %
O2 Saturation: 86 %
PO2 ART: 98 mmHg (ref 80.0–100.0)
TCO2: 29 mmol/L (ref 0–100)
TCO2: 31 mmol/L (ref 0–100)
pCO2 arterial: 68.1 mmHg (ref 35.0–45.0)
pCO2 arterial: 58.9 mmHg (ref 35.0–45.0)
pH, Arterial: 7.213 — ABNORMAL LOW (ref 7.350–7.450)
pH, Arterial: 7.298 — ABNORMAL LOW (ref 7.350–7.450)
pO2, Arterial: 64 mmHg — ABNORMAL LOW (ref 80.0–100.0)

## 2014-01-19 LAB — RENAL FUNCTION PANEL
Albumin: 3 g/dL — ABNORMAL LOW (ref 3.5–5.2)
BUN: 76 mg/dL — ABNORMAL HIGH (ref 6–23)
CALCIUM: 7.9 mg/dL — AB (ref 8.4–10.5)
CO2: 27 meq/L (ref 19–32)
Chloride: 100 mEq/L (ref 96–112)
Creatinine, Ser: 4.39 mg/dL — ABNORMAL HIGH (ref 0.50–1.10)
GFR calc Af Amer: 11 mL/min — ABNORMAL LOW (ref >90)
GFR calc non Af Amer: 9 mL/min — ABNORMAL LOW (ref >90)
GLUCOSE: 137 mg/dL — AB (ref 70–99)
POTASSIUM: 4.8 meq/L (ref 3.7–5.3)
Phosphorus: 6.2 mg/dL — ABNORMAL HIGH (ref 2.3–4.6)
SODIUM: 140 meq/L (ref 137–147)

## 2014-01-19 LAB — GLUCOSE, CAPILLARY
GLUCOSE-CAPILLARY: 95 mg/dL (ref 70–99)
GLUCOSE-CAPILLARY: 166 mg/dL — AB (ref 70–99)
Glucose-Capillary: 220 mg/dL — ABNORMAL HIGH (ref 70–99)
Glucose-Capillary: 114 mg/dL — ABNORMAL HIGH (ref 70–99)
Glucose-Capillary: 102 mg/dL — ABNORMAL HIGH (ref 70–99)

## 2014-01-19 LAB — CBC
HEMATOCRIT: 26.8 % — AB (ref 36.0–46.0)
HEMOGLOBIN: 8.2 g/dL — AB (ref 12.0–15.0)
MCH: 28.7 pg (ref 26.0–34.0)
MCHC: 30.6 g/dL (ref 30.0–36.0)
MCV: 93.7 fL (ref 78.0–100.0)
Platelets: 287 10*3/uL (ref 150–400)
RBC: 2.86 MIL/uL — ABNORMAL LOW (ref 3.87–5.11)
RDW: 15.9 % — ABNORMAL HIGH (ref 11.5–15.5)
WBC: 10.6 10*3/uL — AB (ref 4.0–10.5)

## 2014-01-19 LAB — HEPATITIS B SURFACE ANTIGEN: Hepatitis B Surface Ag: NEGATIVE

## 2014-01-19 LAB — PARATHYROID HORMONE, INTACT (NO CA): PTH: 388.6 pg/mL — AB (ref 14.0–72.0)

## 2014-01-19 LAB — HEPATITIS B SURFACE ANTIBODY,QUALITATIVE: Hep B S Ab: NEGATIVE

## 2014-01-19 LAB — HEPATITIS B CORE ANTIBODY, TOTAL: Hep B Core Total Ab: NONREACTIVE

## 2014-01-19 LAB — MRSA PCR SCREENING: MRSA by PCR: NEGATIVE

## 2014-01-19 SURGERY — INSERTION OF DIALYSIS CATHETER
Anesthesia: Monitor Anesthesia Care

## 2014-01-19 MED ORDER — HEPARIN SODIUM (PORCINE) 5000 UNIT/ML IJ SOLN
INTRAMUSCULAR | Status: DC | PRN
  Administered 2014-01-19: 09:00:00

## 2014-01-19 MED ORDER — ALTEPLASE 2 MG IJ SOLR
2.0000 mg | Freq: Once | INTRAMUSCULAR | Status: DC | PRN
  Administered 2014-01-19: 7 mg
  Filled 2014-01-19 (×2): qty 2

## 2014-01-19 MED ORDER — LIDOCAINE HCL (PF) 1 % IJ SOLN: 5.0000 mL | INTRAMUSCULAR | Status: DC | PRN

## 2014-01-19 MED ORDER — DEXMEDETOMIDINE HCL IN NACL 200 MCG/50ML IV SOLN
INTRAVENOUS | Status: AC
  Filled 2014-01-19: qty 50

## 2014-01-19 MED ORDER — SUCCINYLCHOLINE CHLORIDE 20 MG/ML IJ SOLN
INTRAMUSCULAR | Status: DC | PRN
  Administered 2014-01-19: 160 mg via INTRAVENOUS

## 2014-01-19 MED ORDER — LIDOCAINE HCL (CARDIAC) 20 MG/ML IV SOLN
INTRAVENOUS | Status: AC
  Filled 2014-01-19: qty 5

## 2014-01-19 MED ORDER — PENTAFLUOROPROP-TETRAFLUOROETH EX AERO: 1.0000 "application " | INHALATION_SPRAY | CUTANEOUS | Status: DC | PRN

## 2014-01-19 MED ORDER — LIDOCAINE-PRILOCAINE 2.5-2.5 % EX CREA
1.0000 "application " | TOPICAL_CREAM | CUTANEOUS | Status: DC | PRN
  Filled 2014-01-19: qty 5

## 2014-01-19 MED ORDER — FENTANYL CITRATE 0.05 MG/ML IJ SOLN
INTRAMUSCULAR | Status: AC
  Filled 2014-01-19: qty 5

## 2014-01-19 MED ORDER — EPHEDRINE SULFATE 50 MG/ML IJ SOLN
INTRAMUSCULAR | Status: AC
  Filled 2014-01-19: qty 1

## 2014-01-19 MED ORDER — HEPARIN SODIUM (PORCINE) 1000 UNIT/ML IJ SOLN
INTRAMUSCULAR | Status: DC | PRN
  Administered 2014-01-19: 5 mL

## 2014-01-19 MED ORDER — LIDOCAINE-EPINEPHRINE 0.5 %-1:200000 IJ SOLN
INTRAMUSCULAR | Status: DC | PRN
  Administered 2014-01-19: 10 mL via INTRADERMAL

## 2014-01-19 MED ORDER — THROMBIN 20000 UNITS EX SOLR
CUTANEOUS | Status: AC
  Filled 2014-01-19: qty 20000

## 2014-01-19 MED ORDER — HEPARIN SODIUM (PORCINE) 1000 UNIT/ML DIALYSIS: 20.0000 [IU]/kg | INTRAMUSCULAR | Status: DC | PRN

## 2014-01-19 MED ORDER — HEPARIN SODIUM (PORCINE) 1000 UNIT/ML DIALYSIS
20.0000 [IU]/kg | INTRAMUSCULAR | Status: DC | PRN
  Administered 2014-01-20: 3000 [IU] via INTRAVENOUS_CENTRAL

## 2014-01-19 MED ORDER — FENTANYL CITRATE 0.05 MG/ML IJ SOLN
INTRAMUSCULAR | Status: DC | PRN
  Administered 2014-01-19: 50 ug via INTRAVENOUS

## 2014-01-19 MED ORDER — NEPRO/CARBSTEADY PO LIQD
237.0000 mL | ORAL | Status: DC | PRN
  Filled 2014-01-19: qty 237

## 2014-01-19 MED ORDER — ONDANSETRON HCL 4 MG/2ML IJ SOLN: 4.0000 mg | Freq: Once | INTRAMUSCULAR | Status: DC | PRN

## 2014-01-19 MED ORDER — SUCCINYLCHOLINE CHLORIDE 20 MG/ML IJ SOLN
INTRAMUSCULAR | Status: AC
  Filled 2014-01-19: qty 1

## 2014-01-19 MED ORDER — PROPOFOL 10 MG/ML IV BOLUS
INTRAVENOUS | Status: DC | PRN
  Administered 2014-01-19: 20 mg via INTRAVENOUS
  Administered 2014-01-19: 30 mg via INTRAVENOUS

## 2014-01-19 MED ORDER — LIDOCAINE-EPINEPHRINE 0.5 %-1:200000 IJ SOLN
INTRAMUSCULAR | Status: AC
  Filled 2014-01-19: qty 1

## 2014-01-19 MED ORDER — SODIUM CHLORIDE 0.9 % IV SOLN
INTRAVENOUS | Status: DC | PRN
  Administered 2014-01-19 (×2): via INTRAVENOUS

## 2014-01-19 MED ORDER — INSULIN ASPART 100 UNIT/ML ~~LOC~~ SOLN: 0.0000 [IU] | SUBCUTANEOUS | Status: DC

## 2014-01-19 MED ORDER — SODIUM CHLORIDE 0.9 % IV SOLN: 100.0000 mL | INTRAVENOUS | Status: DC | PRN

## 2014-01-19 MED ORDER — PROPOFOL 10 MG/ML IV BOLUS
INTRAVENOUS | Status: AC
  Filled 2014-01-19: qty 20

## 2014-01-19 MED ORDER — DEXMEDETOMIDINE HCL IN NACL 200 MCG/50ML IV SOLN
INTRAVENOUS | Status: DC | PRN
  Administered 2014-01-19: 0.5 ug/kg/h via INTRAVENOUS

## 2014-01-19 MED ORDER — HEPARIN SODIUM (PORCINE) 1000 UNIT/ML DIALYSIS: 1000.0000 [IU] | INTRAMUSCULAR | Status: DC | PRN

## 2014-01-19 MED ORDER — SODIUM CHLORIDE 0.9 % IJ SOLN
INTRAMUSCULAR | Status: AC
  Filled 2014-01-19: qty 10

## 2014-01-19 MED ORDER — ALTEPLASE 2 MG IJ SOLR
6.0000 mg | Freq: Once | INTRAMUSCULAR | Status: AC | PRN
  Filled 2014-01-19 (×2): qty 6

## 2014-01-19 MED ORDER — ATROPINE SULFATE 0.1 MG/ML IJ SOLN
INTRAMUSCULAR | Status: AC
Start: 2014-01-19 — End: 2014-01-19
  Filled 2014-01-19: qty 10

## 2014-01-19 MED ORDER — HEPARIN SODIUM (PORCINE) 1000 UNIT/ML IJ SOLN
INTRAMUSCULAR | Status: AC
  Filled 2014-01-19: qty 1

## 2014-01-19 MED ORDER — LABETALOL HCL 5 MG/ML IV SOLN
10.0000 mg | INTRAVENOUS | Status: DC | PRN
  Administered 2014-01-25: 10 mg via INTRAVENOUS
  Filled 2014-01-19: qty 4

## 2014-01-19 MED ORDER — PROPOFOL INFUSION 10 MG/ML OPTIME
INTRAVENOUS | Status: DC | PRN
  Administered 2014-01-19: 25 ug/kg/min via INTRAVENOUS

## 2014-01-19 MED ORDER — FENTANYL CITRATE 0.05 MG/ML IJ SOLN
25.0000 ug | INTRAMUSCULAR | Status: DC | PRN
Start: 2014-01-19 — End: 2014-01-19

## 2014-01-19 SURGICAL SUPPLY — 59 items
APL SKNCLS STERI-STRIP NONHPOA (GAUZE/BANDAGES/DRESSINGS) ×1
ARMBAND PINK RESTRICT EXTREMIT (MISCELLANEOUS) ×2 IMPLANT
BAG DECANTER FOR FLEXI CONT (MISCELLANEOUS) ×2 IMPLANT
BENZOIN TINCTURE PRP APPL 2/3 (GAUZE/BANDAGES/DRESSINGS) ×2 IMPLANT
CANISTER SUCTION 2500CC (MISCELLANEOUS) ×2 IMPLANT
CATH CANNON HEMO 15F 50CM (CATHETERS) IMPLANT
CATH CANNON HEMO 15FR 19 (HEMODIALYSIS SUPPLIES) IMPLANT
CATH CANNON HEMO 15FR 23CM (HEMODIALYSIS SUPPLIES) IMPLANT
CATH CANNON HEMO 15FR 31CM (HEMODIALYSIS SUPPLIES) IMPLANT
CATH CANNON HEMO 15FR 32 (HEMODIALYSIS SUPPLIES) IMPLANT
CATH CANNON HEMO 15FR 32CM (HEMODIALYSIS SUPPLIES) ×2 IMPLANT
CATH ROBINSON RED A/P 18FR (CATHETERS) ×1 IMPLANT
CLIP LIGATING EXTRA MED SLVR (CLIP) ×2 IMPLANT
CLIP LIGATING EXTRA SM BLUE (MISCELLANEOUS) ×2 IMPLANT
COVER PROBE W GEL 5X96 (DRAPES) ×2 IMPLANT
COVER SURGICAL LIGHT HANDLE (MISCELLANEOUS) ×2 IMPLANT
DECANTER SPIKE VIAL GLASS SM (MISCELLANEOUS) ×2 IMPLANT
DRAPE C-ARM 42X72 X-RAY (DRAPES) ×2 IMPLANT
DRAPE CHEST BREAST 15X10 FENES (DRAPES) ×2 IMPLANT
ELECT REM PT RETURN 9FT ADLT (ELECTROSURGICAL) ×2
ELECTRODE REM PT RTRN 9FT ADLT (ELECTROSURGICAL) ×1 IMPLANT
GAUZE SPONGE 2X2 8PLY STRL LF (GAUZE/BANDAGES/DRESSINGS) ×1 IMPLANT
GAUZE SPONGE 4X4 16PLY XRAY LF (GAUZE/BANDAGES/DRESSINGS) ×2 IMPLANT
GEL ULTRASOUND 20GR AQUASONIC (MISCELLANEOUS) IMPLANT
GLOVE SS BIOGEL STRL SZ 7.5 (GLOVE) ×1 IMPLANT
GLOVE SUPERSENSE BIOGEL SZ 7.5 (GLOVE) ×1
GOWN STRL REUS W/ TWL LRG LVL3 (GOWN DISPOSABLE) ×3 IMPLANT
GOWN STRL REUS W/TWL LRG LVL3 (GOWN DISPOSABLE) ×6
KIT BASIN OR (CUSTOM PROCEDURE TRAY) ×2 IMPLANT
KIT ROOM TURNOVER OR (KITS) ×2 IMPLANT
NDL 18GX1X1/2 (RX/OR ONLY) (NEEDLE) ×1 IMPLANT
NDL HYPO 25GX1X1/2 BEV (NEEDLE) ×1 IMPLANT
NEEDLE 18GX1X1/2 (RX/OR ONLY) (NEEDLE) ×2 IMPLANT
NEEDLE 22X1 1/2 (OR ONLY) (NEEDLE) ×2 IMPLANT
NEEDLE HYPO 25GX1X1/2 BEV (NEEDLE) ×2 IMPLANT
NS IRRIG 1000ML POUR BTL (IV SOLUTION) ×2 IMPLANT
PACK CV ACCESS (CUSTOM PROCEDURE TRAY) ×2 IMPLANT
PACK SURGICAL SETUP 50X90 (CUSTOM PROCEDURE TRAY) ×2 IMPLANT
PAD ARMBOARD 7.5X6 YLW CONV (MISCELLANEOUS) ×4 IMPLANT
SOAP 2 % CHG 4 OZ (WOUND CARE) ×2 IMPLANT
SPONGE GAUZE 2X2 STER 10/PKG (GAUZE/BANDAGES/DRESSINGS) ×1
SPONGE GAUZE 4X4 12PLY (GAUZE/BANDAGES/DRESSINGS) ×2 IMPLANT
STRIP CLOSURE SKIN 1/2X4 (GAUZE/BANDAGES/DRESSINGS) ×2 IMPLANT
SUT ETHILON 3 0 PS 1 (SUTURE) ×2 IMPLANT
SUT PROLENE 6 0 CC (SUTURE) ×4 IMPLANT
SUT SILK 2 0 FS (SUTURE) IMPLANT
SUT VIC AB 3-0 SH 27 (SUTURE) ×4
SUT VIC AB 3-0 SH 27X BRD (SUTURE) ×2 IMPLANT
SUT VICRYL 4-0 PS2 18IN ABS (SUTURE) ×2 IMPLANT
SYR 20CC LL (SYRINGE) ×2 IMPLANT
SYR 30ML LL (SYRINGE) IMPLANT
SYR 5ML LL (SYRINGE) ×4 IMPLANT
SYR CONTROL 10ML LL (SYRINGE) ×2 IMPLANT
SYRINGE 10CC LL (SYRINGE) ×2 IMPLANT
TAPE CLOTH SURG 4X10 WHT LF (GAUZE/BANDAGES/DRESSINGS) ×1 IMPLANT
TOWEL OR 17X24 6PK STRL BLUE (TOWEL DISPOSABLE) ×2 IMPLANT
TOWEL OR 17X26 10 PK STRL BLUE (TOWEL DISPOSABLE) ×2 IMPLANT
UNDERPAD 30X30 INCONTINENT (UNDERPADS AND DIAPERS) ×2 IMPLANT
WATER STERILE IRR 1000ML POUR (IV SOLUTION) ×2 IMPLANT

## 2014-01-19 NOTE — Procedures (Signed)
Extubation Procedure Note  Patient Details:   Name: Jacqueline [name] DOB: 17-May-1946 MRN: [id number]   Airway Documentation:  Airway 7.5 mm (Active)  Secured at (cm) 23 cm 01/19/2014 10:15 AM  Measured From Lips 01/19/2014 10:15 AM  Secured Location Center 01/19/2014 10:15 AM  Secured By Rana Snare Tape 01/19/2014 10:15 AM    Evaluation  O2 sats: stable throughout Complications: No apparent complications Patient did tolerate procedure well. Bilateral Breath Sounds: Diminished   Yes, pt able to speak, no stridor noted.  No resp distress noted.   Lenna Sciara 01/19/2014, 10:27 AM

## 2014-01-19 NOTE — Progress Notes (Signed)
Arrived to pacu with ett in place assisted ventilations with ambu bag by julie renning Mantoloking with pt placed on vent no true setting for vent resp therapist different settings used to deremine pts level of breathing

## 2014-01-19 NOTE — Consult Note (Signed)
PULMONARY / CRITICAL CARE MEDICINE  Name: Jacqueline [name] MRN: [id number] DOB: 1946/09/02    ADMISSION DATE:  01/15/2014 CONSULTATION DATE:  01/19/2014  REFERRING MD :  EDP PRIMARY SERVICE:  PCCM  CHIEF COMPLAINT:  Acute respiratory failure  BRIEF PATIENT DESCRIPTION: 68 yo obese with past medical history of ESRD brought to ICU after becoming apneic during vascular procedure, requiring brief intubation.  SIGNIFICANT EVENTS / STUDIES:    LINES / TUBES: R IJ tunneled HD cath 2/18 >>>  CULTURES:  ANTIBIOTICS:  The patient is encephalopathic and unable to provide history, which was obtained for available medical records.  HISTORY OF PRESENT ILLNESS:  68 yo obese with past medical history of ESRD brought to ICU after becoming apneic during vascular procedure, requiring brief intubation.  PAST MEDICAL HISTORY :  Past Medical History  Diagnosis Date  . Hypertension   . Diabetes mellitus   . Hyperlipidemia   . Obesity    Past Surgical History  Procedure Laterality Date  . Abdominal hysterectomy     Prior to Admission medications   Medication Sig Start Date End Date Taking? Authorizing Provider  albuterol (PROVENTIL HFA;VENTOLIN HFA) 108 (90 BASE) MCG/ACT inhaler Inhale 2 puffs into the lungs every 6 (six) hours as needed for wheezing or shortness of breath.   Yes Historical Provider, MD  amLODipine-olmesartan (AZOR) 5-40 MG per tablet Take 1 tablet by mouth daily.     Yes Historical Provider, MD  b complex vitamins capsule Take 1 capsule by mouth daily.   Yes Historical Provider, MD  cholecalciferol (VITAMIN D) 1000 UNITS tablet Take 2,000 Units by mouth daily.   Yes Historical Provider, MD  fish oil-omega-3 fatty acids 1000 MG capsule Take 1 g by mouth daily.     Yes Historical Provider, MD  Garlic [id number] MG CAPS Take 2 capsules by mouth daily.     Yes Historical Provider, MD  glipiZIDE (GLUCOTROL) 10 MG tablet Take 10 mg by mouth 2 (two) times daily before a meal.     Yes  Historical Provider, MD  magnesium 30 MG tablet Take 30 mg by mouth 2 (two) times daily.   Yes Historical Provider, MD  nebivolol (BYSTOLIC) 10 MG tablet Take 10 mg by mouth daily.     Yes Historical Provider, MD  OVER THE COUNTER MEDICATION Gout out herbal. 1 tablet once daily   Yes Historical Provider, MD  polysaccharide iron (NIFEREX) 150 MG CAPS capsule Take 150 mg by mouth daily.     Yes Historical Provider, MD  potassium chloride (KLOR-CON) 20 MEQ packet Take 20 mEq by mouth daily.     Yes Historical Provider, MD  pravastatin (PRAVACHOL) 20 MG tablet Take 20 mg by mouth at bedtime.     Yes Historical Provider, MD  prazosin (MINIPRESS) 5 MG capsule Take 10 mg by mouth at bedtime.     Yes Historical Provider, MD  torsemide (DEMADEX) 10 MG tablet Take 2 tablets (20 mg total) by mouth 2 (two) times daily. 01/07/12  Yes Lisette Paz, PA-C   Allergies  Allergen Reactions  . Sulfa Drugs Cross Reactors Other (See Comments)    Doesn't remember    FAMILY HISTORY:  Family History  Problem Relation Age of Onset  . Diabetes Mother   . CAD Father   . CAD Sister    SOCIAL HISTORY:  reports that she has never smoked. She has never used smokeless tobacco. She reports that she does not drink alcohol or use illicit drugs.  REVIEW  OF SYSTEMS:  Unable to provide.  INTERVAL HISTORY:  VITAL SIGNS: Temp:  [97.3 F (36.3 C)-98.9 F (37.2 C)] 98.2 F (36.8 C) (02/18 1100) Pulse Rate:  [67-80] 78 (02/18 1100) Resp:  [18-24] 18 (02/18 1100) BP: (97-139)/(32-85) 125/41 mmHg (02/18 1100) SpO2:  [96 %-100 %] 100 % (02/18 1100) FiO2 (%):  [50 %] 50 % (02/18 1015) Weight:  [148.145 kg (326 lb 9.6 oz)] 148.145 kg (326 lb 9.6 oz) (02/18 0624)  HEMODYNAMICS:   VENTILATOR SETTINGS: Vent Mode:  [-] SIMV;PSV FiO2 (%):  [50 %] 50 % Set Rate:  [10 bmp] 10 bmp Vt Set:  [500 mL] 500 mL PEEP:  [5 cmH20] 5 cmH20 Pressure Support:  [10 cmH20] 10 cmH20 Plateau Pressure:  [25 cmH20] 25 cmH20  INTAKE /  OUTPUT: Intake/Output     02/17 0701 - 02/18 0700 02/18 0701 - 02/19 0700   P.O. 520    I.V. (mL/kg)  500 (3.4)   Total Intake(mL/kg) 520 (3.5) 500 (3.4)   Urine (mL/kg/hr) 975 (0.3)    Stool 1 (0)    Total Output 976     Net -456 +500         PHYSICAL EXAMINATION: General: No acute distress Neuro:  Somnolent but arouses to stimulation HEENT:  PERRL, crowded oropharynx Cardiovascular:  RRR, distant heart sounds, no m/r/g, catheter site intact Lungs:  Bilateral diminished air entry, no w/r/r Abdomen:  Obese, soft Musculoskeletal:  Moves all extremities Skin:  Intact  LABS: CBC  Recent Labs Lab 01/15/14 1730 01/18/14 0430 01/19/14 0401  WBC 10.4 10.7* 10.6*  HGB 9.0* 8.0* 8.2*  HCT 28.9* 26.6* 26.8*  PLT 294 295 287   Coag's No results found for this basename: APTT, INR,  in the last 168 hours  BMET  Recent Labs Lab 01/18/14 0430 01/18/14 1620 01/19/14 0401  NA 141 142 140  K 5.7* 5.0 4.8  CL 100 101 100  CO2 26 28 27   BUN 67* 73* 76*  CREATININE 4.31* 4.41* 4.39*  GLUCOSE 227* 118* 137*   Electrolytes  Recent Labs Lab 01/18/14 0430 01/18/14 1620 01/19/14 0401  CALCIUM 7.9* 7.7* 7.9*  PHOS 5.9*  --  6.2*   Sepsis Markers No results found for this basename: LATICACIDVEN, PROCALCITON, O2SATVEN,  in the last 168 hours ABG  Recent Labs Lab 01/18/14 1243  PHART 7.311*  PCO2ART 54.4*  PO2ART 82.9   Liver Enzymes  Recent Labs Lab 01/18/14 0430 01/19/14 0401  ALBUMIN 3.1* 3.0*   Cardiac Enzymes  Recent Labs Lab 01/15/14 1730  PROBNP 4646.0*   Glucose  Recent Labs Lab 01/18/14 1156 01/18/14 1619 01/18/14 2139 01/19/14 0545 01/19/14 1023 01/19/14 1216  GLUCAP 137* 111* 78 95 166* 220*   Imaging Dg Chest 1 View  01/19/2014   CLINICAL DATA:  Status post Diatek placement.  EXAM: CHEST - 1 VIEW  COMPARISON:  01/15/2014  FINDINGS: There has been interval placement of a right jugular central venous catheter with tip overlying the  mid right atrium. The cardiac silhouette remains enlarged. Thoracic aortic calcification is present. No pneumothorax is identified. Lung volumes are diminished compared to the prior study with increased pulmonary vascular congestion and bibasilar opacities.  IMPRESSION: 1. Interval right jugular central venous catheter placement with tip overlying the right atrium. 2. Diminished lung volumes with mild pulmonary edema and increased bibasilar opacities, which may reflect atelectasis. These results were called by telephone at the time of interpretation on 01/19/2014 at 10:45 AM to [employer] in  the PACU who verbally acknowledged these results.   Electronically Signed   By: Logan Bores   On: 01/19/2014 10:45   ASSESSMENT / PLAN:  PULMONARY A:   Acute on chronic respiratory failure Suspect OSA / OHS Small lung volumes Mild pulmonary edema Protects airway P:   Goal SpO2>92, pH>7.30 BiPAP / NIMV Trend ABG May need airway  CARDIOVASCULAR A:  HTN P:  Goal BP<160/90 Labetalol PRN Hold Norvasc, Bidil, Bystolic, Minipress, Zocor until able take PO  RENAL A:   ESRD S/p tunneled HD cath placement Metabolic acidosis P:   Renal following Trend BMP Laisx Hold Zaroxolyn HD  GASTROINTESTINAL A:   High risk for aspiration Nutrition GI Px is not indicated P:   NPO  HEMATOLOGIC A:   Chronic anemia of renal disease VTE Px P:  Trend CBC Lovenox  INFECTIOUS A:   No active issues P:   No intervention required  ENDOCRINE  A:   DM P:   SSI change to q4h Hold Glucotrol  NEUROLOGIC A:   Acute encephalopathy  P:   Avoid sedatives / hypnotics  I have personally obtained history, examined patient, evaluated and interpreted laboratory and imaging results, reviewed medical records, formulated assessment / plan and placed orders.  CRITICAL CARE:  The patient is critically ill with multiple organ systems failure and requires high complexity decision making for assessment and  support, frequent evaluation and titration of therapies, application of advanced monitoring technologies and extensive interpretation of multiple databases. Critical Care Time devoted to patient care services described in this note is 45 minutes.   Doree Fudge, MD Pulmonary and [place] Pager: [phone]  01/19/2014, 12:39 PM

## 2014-01-19 NOTE — Preoperative (Signed)
Beta Blockers   Reason not to administer Beta Blockers:Not Applicable 

## 2014-01-19 NOTE — Progress Notes (Signed)
Chest xray report called to Dr. Donnetta Hutching. No new orders received. Patient is good to go to room on 30M

## 2014-01-19 NOTE — Anesthesia Preprocedure Evaluation (Addendum)
Anesthesia Evaluation  Patient identified by MRN, date of birth, ID band Patient awake    Reviewed: Allergy & Precautions, H&P , NPO status , Patient's Chart, lab work & pertinent test results  Airway Mallampati: II      Dental  (+) Poor Dentition, Edentulous Upper, Chipped,    Pulmonary    + decreased breath sounds      Cardiovascular hypertension, Pt. on home beta blockers +CHF Rhythm:Regular Rate:Normal     Neuro/Psych    GI/Hepatic   Endo/Other  diabetes  Renal/GU Renal InsufficiencyRenal disease     Musculoskeletal   Abdominal (+) + obese,   Peds  Hematology   Anesthesia Other Findings   Reproductive/Obstetrics                          Anesthesia Physical Anesthesia Plan  ASA: IV  Anesthesia Plan: MAC   Post-op Pain Management:    Induction: Intravenous  Airway Management Planned:   Additional Equipment:   Intra-op Plan:   Post-operative Plan:   Informed Consent: I have reviewed the patients History and Physical, chart, labs and discussed the procedure including the risks, benefits and alternatives for the proposed anesthesia with the patient or authorized representative who has indicated his/her understanding and acceptance.   Dental advisory given  Plan Discussed with: CRNA and Anesthesiologist  Anesthesia Plan Comments:         Anesthesia Quick Evaluation

## 2014-01-19 NOTE — Progress Notes (Signed)
Pt extubated by dr Linna Caprice tolerated well placed on simple mask at 10 liters o2 sats=98

## 2014-01-19 NOTE — Op Note (Signed)
    OPERATIVE REPORT  DATE OF SURGERY: 01/19/2014  PATIENT: Jacqueline [name], 68 y.o. female MRN: [id number]  DOB: [birth year]  PRE-OPERATIVE DIAGNOSIS: End-stage renal disease  POST-OPERATIVE DIAGNOSIS:  Same  PROCEDURE: Right IJ hemodialysis catheter  SURGEON:  Curt Jews, M.D.  PHYSICIAN ASSISTANT: Nurse  ANESTHESIA:  Gen.  EBL: Minimal ml     BLOOD ADMINISTERED: None  DRAINS: None  SPECIMEN: None  COUNTS CORRECT:  YES  PLAN OF CARE: PACU   PATIENT DISPOSITION:  PACU - hemodynamically stable  PROCEDURE DETAILS: Patient is morbidly obese 68 year old female who has long history of chronic renal insufficiency and is now progressed to end-stage renal disease. We were consult and for a tunneled catheter and also consideration of permanent access. Vein map showed good caliber upper arm cephalic vein and she was consented for IJ catheter and left arm AV fistula. The patient was rather somnolent. I did discuss this with her in the holding area and her family present. She did understand and was hoping that the diuresis would prevent access requirement. I did explain that nephrology had requested access and that is no longer possible to delay hemodialysis. The patient did have a difficult time the standing awake. I discussed this with anesthesia we decided to attempt local with sedation.  The patient was placed in supine position theright neck was imaged with ultrasound showing a widely patent jugular vein. The right and left neck and chest were prepped and draped in usual sterile fashion. The local anesthesia was used to anesthetize the area near the adductor insertion. The patient became apneic. The sterile drapes were removed and anesthesia intubated the patient. The right and left neck were then re\re prepped and draped and using an 18-gauge needle the right internal jugular vein was easily accessed and a guidewire was passed on the level a right atrium this was confirmed with  fluoroscopy. A dilator and peel-away sheath were placed over the guidewire. The dilator and guidewire removed. A 27 cm catheter was positioned level distal right atrium and this was confirmed with fluoroscopy. The peel-away sheath was removed. The cath was brought through subcutaneous tunnel through a separate stab incision. A 2 lm ports were attached in both lumens flushed and aspirated easily reluctant without unit per cc heparin. The catheter was secured the skin. The entry site was closed with a 4 subcuticular Vicryl stitch and a 3-0 nylon suture. Sterile dressing was applied and the patient was transferred to the medical intensive care unit intubated. I did discuss this with Dr.Feinstein with critical care medicine in the medical intensive care unit for event and medical management. I did not place a fistula today do to her medical condition. We will "safe" her left arm for future AV fistula access   Curt Jews, M.D. 01/19/2014 9:48 AM

## 2014-01-19 NOTE — Progress Notes (Signed)
Patient ID: Jacqueline [name], female   DOB: [birth year], 68 y.o.   MRN: [id number] S:Events surrounding PC placement noted and appreciate VVS and Anesthesia's care.  Pt remains somnolent but arousable and "want to go back to my room" O:BP 125/41  Pulse 78  Temp(Src) 98.2 F (36.8 C) (Oral)  Resp 18  Ht 5\' 5"  (1.651 m)  Wt 148.145 kg (326 lb 9.6 oz)  BMI 54.35 kg/m2  SpO2 100%  Intake/Output Summary (Last 24 hours) at 01/19/14 1201 Last data filed at 01/19/14 0945  Gross per 24 hour  Intake    780 ml  Output    676 ml  Net    104 ml   Intake/Output: I/O last 3 completed shifts: In: 1000 [P.O.:1000] Out: 1076 [Urine:1075; Stool:1]  Intake/Output this shift:  Total I/O In: 500 [I.V.:500] Out: -  Weight change: 0.145 kg (5.1 oz) Gen: morbidly obese AAF who is somnolent but arousable [id number] HS, no rub Resp:decreased BS at bases [id number] Ext:2+ edema bilaterally   Recent Labs Lab 01/15/14 1730 01/16/14 0400 01/17/14 0525 01/18/14 0430 01/18/14 1620 01/19/14 0401  NA 143 146 139 141 142 140  K 4.5 5.0 5.3 5.7* 5.0 4.8  CL 102 105 100 100 101 100  CO2 26 27 27 26 28 27   GLUCOSE 199* 169* 240* 227* 118* 137*  BUN 53* 54* 60* 67* 73* 76*  CREATININE 3.33* 3.38* 3.89* 4.31* 4.41* 4.39*  ALBUMIN  --   --   --  3.1*  --  3.0*  CALCIUM 8.4 8.4 8.2* 7.9* 7.7* 7.9*  PHOS  --   --   --  5.9*  --  6.2*   Liver Function Tests:  Recent Labs Lab 01/18/14 0430 01/19/14 0401  ALBUMIN 3.1* 3.0*   No results found for this basename: LIPASE, AMYLASE,  in the last 168 hours No results found for this basename: AMMONIA,  in the last 168 hours CBC:  Recent Labs Lab 01/15/14 1730 01/18/14 0430 01/19/14 0401  WBC 10.4 10.7* 10.6*  NEUTROABS 8.0*  --   --   HGB 9.0* 8.0* 8.2*  HCT 28.9* 26.6* 26.8*  MCV 92.3 95.3 93.7  PLT 294 295 287   Cardiac Enzymes: No results found for this basename: CKTOTAL, CKMB, CKMBINDEX, TROPONINI,  in the last 168 hours CBG:  Recent  Labs Lab 01/18/14 1156 01/18/14 1619 01/18/14 2139 01/19/14 0545 01/19/14 1023  GLUCAP 137* 111* 78 95 166*    Iron Studies:  Recent Labs  01/17/14 1715  IRON 30*  TIBC 218*  FERRITIN 78   Studies/Results: Dg Chest 1 View  01/19/2014   CLINICAL DATA:  Status post Diatek placement.  EXAM: CHEST - 1 VIEW  COMPARISON:  01/15/2014  FINDINGS: There has been interval placement of a right jugular central venous catheter with tip overlying the mid right atrium. The cardiac silhouette remains enlarged. Thoracic aortic calcification is present. No pneumothorax is identified. Lung volumes are diminished compared to the prior study with increased pulmonary vascular congestion and bibasilar opacities.  IMPRESSION: 1. Interval right jugular central venous catheter placement with tip overlying the right atrium. 2. Diminished lung volumes with mild pulmonary edema and increased bibasilar opacities, which may reflect atelectasis. These results were called by telephone at the time of interpretation on 01/19/2014 at 10:45 AM to Ashley Akin in the PACU who verbally acknowledged these results.   Electronically Signed   By: Logan Bores   On: 01/19/2014 10:45   . amLODipine  5  mg Oral Daily  . B-complex with vitamin C  1 tablet Oral Daily  . cholecalciferol  2,000 Units Oral Daily  . enoxaparin (LOVENOX) injection  30 mg Subcutaneous Q24H  . furosemide  160 mg Intravenous 3 times per day  . glipiZIDE  10 mg Oral BID AC  . insulin aspart  0-5 Units Subcutaneous QHS  . insulin aspart  0-9 Units Subcutaneous TID WC  . iron polysaccharides  150 mg Oral Daily  . isosorbide-hydrALAZINE  1 tablet Oral BID  . metolazone  2.5 mg Oral Daily  . nebivolol  10 mg Oral Daily  . omega-3 acid ethyl esters  1 g Oral Daily  . prazosin  10 mg Oral QHS  . simvastatin  10 mg Oral q1800  . sodium chloride  3 mL Intravenous Q12H    BMET    Component Value Date/Time   NA 140 01/19/2014 0401   K 4.8 01/19/2014 0401    CL 100 01/19/2014 0401   CO2 27 01/19/2014 0401   GLUCOSE 137* 01/19/2014 0401   BUN 76* 01/19/2014 0401   CREATININE 4.39* 01/19/2014 0401   CALCIUM 7.9* 01/19/2014 0401   GFRNONAA 9* 01/19/2014 0401   GFRAA 11* 01/19/2014 0401   CBC    Component Value Date/Time   WBC 10.6* 01/19/2014 0401   RBC 2.86* 01/19/2014 0401   RBC 3.00* 01/15/2014 2335   HGB 8.2* 01/19/2014 0401   HCT 26.8* 01/19/2014 0401   PLT 287 01/19/2014 0401   MCV 93.7 01/19/2014 0401   MCH 28.7 01/19/2014 0401   MCHC 30.6 01/19/2014 0401   RDW 15.9* 01/19/2014 0401   LYMPHSABS 1.5 01/15/2014 1730   MONOABS 0.5 01/15/2014 1730   EOSABS 0.3 01/15/2014 1730   BASOSABS 0.0 01/15/2014 1730     Assessment/Plan:  1. AKI/CKD in setting of acute decompensated diastolic CHF and IV diuresis with concomitant ARB therapy. She has had progressive CKD over the last 2 years and now with worsening salt and water excess as well as ACDz and SHPTH.  1. Scr has risen with IV lasix and remains volume overloaded so will increase to 160mg  tid and add metolazone, but has not responded with improved diuresis. 2. she will require HD today since she has not responded to high dose diuretics. 3. We discussed the chronicity and severity of her CKD and will need to pursue vein mapping and access placement as well as education about her treatment options and  4. Appreciate VVS placement of HD catheter (AVF on hold due to AMS) 2. Acute on chronic CHF, diastolic- responding to IV diuresis and will need to resume as her UOP has significantly dropped off after stopping IV lasix.  1. Na restriction as well as fluid restriction (neither of which she had been doing at home) 2. Recommend dietician consult for weight loss as well as low Na diet 3. Nonadherent with low Na diet as well as fluid restriction 4. Question if she also has sleep apnea contributing to right-heart failure 3. HTN- better post diuresis 4. AMS- ABG showed PCO2 that rose from 44 to 54.  Likely has  component of OSA and may benefit from CPAP qhs. 1. PCCM following 5. DM- per primary svc 6. Obesity- will need dietician evaluation/education. 7. Anemia of chronic disease- Hgb has been trending down since January of 2014 and was 9.9 in Sept 2014.  1. low iron stores will give feraheme 1020mg  2. start ESA once CHF is under better control or once we  start HD 8. SHPTH- has had an elevated iPTH of 149 in 1/14, will recheck but will likely require po vit D 9. Vascular access- s/p RIJ PC 1. For LUE AVF once more stable 2. Plan for HD with UF to help improve overall medical condition.    [place]

## 2014-01-19 NOTE — Progress Notes (Signed)
UR Completed.  Jacqueline [name] [id number] 01/19/2014

## 2014-01-19 NOTE — Transfer of Care (Signed)
Immediate Anesthesia Transfer of Care Note  Patient: Jacqueline [name]  Procedure(s) Performed: Procedure(s): INSERTION OF DIALYSIS CATHETER (N/A)  Patient Location: PACU  Anesthesia Type:General  Level of Consciousness: obtunded and Patient remains intubated per anesthesia plan  Airway & Oxygen Therapy: Patient remains intubated per anesthesia plan and Patient placed on Ventilator (see vital sign flow sheet for setting)  Post-op Assessment: Report given to PACU RN  Post vital signs: Reviewed and unstable  Complications: respiratory complications

## 2014-01-19 NOTE — H&P (View-Only) (Signed)
[facility] [place]  Patient name: Jacqueline [name] MRN: [id number] DOB: Mar 14, 1946 Sex: female  REASON FOR CONSULT: Needs tunneled dialysis catheter and AV fistula or AV graft. Consult from Dr. Marval Regal.   HPI: Jacqueline [name] is a 68 y.o. female who was admitted on 01/15/2014 with shortness of breath and swelling in both lower extremities. I am obtaining the history mostly from the chart as the patient is somewhat lethargic. I am having a hard time keeping her awake. She was admitted with acute diastolic congestive heart failure. She also has a history of stage IV chronic kidney disease in her renal function was worsening. The patient was seen in consultation by nephrology. It is felt that she will likely need dialysis in the near future and we were asked to place a tunneled dialysis catheter and also long-term access.  For shortness of breath and swelling in her lower extremities has improved since admission and she has been diuresed. Her hemoglobin A1c was elevated on admission and it diabetes coordinator is working with the patient.   Past Medical History  Diagnosis Date  . Hypertension   . Diabetes mellitus   . Hyperlipidemia   . Obesity    Family History  Problem Relation Age of Onset  . Diabetes Mother   . CAD Father   . CAD Sister    SOCIAL HISTORY: History  Substance Use Topics  . Smoking status: Never Smoker   . Smokeless tobacco: Never Used  . Alcohol Use: No   Allergies  Allergen Reactions  . Sulfa Drugs Cross Reactors Other (See Comments)    Doesn't remember    REVIEW OF SYSTEMS: Pt is lethargic and I am unable to obtain a ROS at this time.    PHYSICAL EXAM: Filed Vitals:   01/17/14 1958 01/17/14 2207 01/18/14 0549 01/18/14 1010  BP: 156/56  128/57 123/96  Pulse: 68  78 68  Temp: 98.8 F (37.1 C)  100 F (37.8 C)   TempSrc: Oral  Oral   Resp: 18  21   Height:  5\' 5"  (1.651 m)    Weight:   326 lb 4.5 oz (148 kg)   SpO2: 97%   100%    Body mass index is 54.3 kg/(m^2). GENERAL: The patient is an obese  female, who is somewhat lethargic. The vital signs are documented above. CARDIOVASCULAR: There is a regular rate and rhythm. No carotid bruits. Palpable radial pulses bilaterally.  PULMONARY: There is good air exchange bilaterally without wheezing or rales. ABDOMEN: Obese and difficult to assess.  MUSCULOSKELETAL: There are no major deformities or cyanosis. NEUROLOGIC: No focal weakness or paresthesias are detected. SKIN: There are no ulcers or rashes noted. Ecchymosis in left antecubital space from previous IV. There is an IV in the right antecubital vein.   DATA:  Lab Results  Component Value Date   WBC 10.7* 01/18/2014   HGB 8.0* 01/18/2014   HCT 26.6* 01/18/2014   MCV 95.3 01/18/2014   PLT 295 01/18/2014   Lab Results  Component Value Date   NA 141 01/18/2014   K 5.7* 01/18/2014   CL 100 01/18/2014   CO2 26 01/18/2014   Lab Results  Component Value Date   CREATININE 4.31* 01/18/2014   No results found for this basename: INR, PROTIME   Lab Results  Component Value Date   HGBA1C 7.3* 01/16/2014   CBG (last 3)   Recent Labs  01/18/14 0154 01/18/14 0632 01/18/14 1156  GLUCAP 221*  197* 137*   2-D echo: Normal systolic function. Ejection fraction estimated to be 55-60%.  Vein map is pending and has been ordered.  MEDICAL ISSUES:  Stage V chronic kidney disease: the patient is left-handed. Her vein mapping is pending but I have ordered this. I have scheduled her for placement of a tunneled dialysis catheter and left AV fistula or AV graft for tomorrow by Dr. Kellie Simmering. I had a difficult time keeping her awake but was able to keep her awake long enough to explain the procedure to her. I have explained the indications for placement of an AV fistula or AV graft. I've explained that if at all possible we will place an AV fistula.  I have reviewed the risks of placement of an AV fistula including but not limited  to: failure of the fistula to mature, need for subsequent interventions, and thrombosis. In addition I have reviewed the potential complications of placement of an AV graft. These risks include, but are not limited to, graft thrombosis, graft infection, wound healing problems, bleeding, arm swelling, and steal syndrome. All the patient's questions were answered and they are agreeable to proceed with surgery.  I have spoken to Dr. Marval Regal on the phone who is in agreement with this plan.  [facility] [place]: [phone]

## 2014-01-19 NOTE — Anesthesia Procedure Notes (Signed)
Procedure Name: Intubation Date/Time: 01/19/2014 9:15 AM Performed by: Sampson Si E Pre-anesthesia Checklist: Patient identified, Emergency Drugs available, Suction available and Patient being monitored Oxygen Delivery Method: Circle system utilized Preoxygenation: Pre-oxygenation with 100% oxygen Intubation Type: Rapid sequence and IV induction Ventilation: Oral airway inserted - appropriate to patient size, Mask ventilation with difficulty and Two handed mask ventilation required Laryngoscope Size: Mac Grade View: Grade I Tube type: Oral Tube size: 7.5 mm Number of attempts: 3 Airway Equipment and Method: Stylet and Video-laryngoscopy Placement Confirmation: ETT inserted through vocal cords under direct vision,  positive ETCO2 and breath sounds checked- equal and bilateral Secured at: 23 cm Tube secured with: Tape Dental Injury: Teeth and Oropharynx as per pre-operative assessment  Difficulty Due To: Difficulty was unanticipated, Difficult Airway- due to anterior larynx and Difficult Airway- due to large tongue

## 2014-01-19 NOTE — Progress Notes (Signed)
Patient transferred to 76M from PACU.  Report called to [employer], RN on 76M.  Patient's belongings sent to 2S, but will be transferred to 76M by 2S staff.

## 2014-01-19 NOTE — Interval H&P Note (Signed)
History and Physical Interval Note:  01/19/2014 7:07 AM  Jacqueline [name]  has presented today for surgery, with the diagnosis of Chronic kidney disease  The various methods of treatment have been discussed with the patient and family. After consideration of risks, benefits and other options for treatment, the patient has consented to  Procedure(s): INSERTION OF ARTERIOVENOUS FISTULA vs AVG (N/A) INSERTION OF DIALYSIS CATHETER (N/A) as a surgical intervention .  The patient's history has been reviewed, patient examined, no change in status, stable for surgery.  I have reviewed the patient's chart and labs.  Questions were answered to the patient's satisfaction.     Jacqueline [name]

## 2014-01-19 NOTE — Progress Notes (Signed)
Pt refers she is feeling anxious and confuse during the nigh, VSS, CBG completed WNL, pt NPO as ordered by MD, medication given as ordered. Pt denies any pain at this time. Pt to be transfer to OR at 7 am husband will go with the pt to sign consent after talking to MD.

## 2014-01-20 DIAGNOSIS — I5031 Acute diastolic (congestive) heart failure: Secondary | ICD-10-CM

## 2014-01-20 DIAGNOSIS — D649 Anemia, unspecified: Secondary | ICD-10-CM

## 2014-01-20 DIAGNOSIS — I509 Heart failure, unspecified: Secondary | ICD-10-CM

## 2014-01-20 LAB — RENAL FUNCTION PANEL
Albumin: 2.7 g/dL — ABNORMAL LOW (ref 3.5–5.2)
BUN: 61 mg/dL — AB (ref 6–23)
CALCIUM: 7.6 mg/dL — AB (ref 8.4–10.5)
CO2: 28 meq/L (ref 19–32)
Chloride: 99 mEq/L (ref 96–112)
Creatinine, Ser: 3.37 mg/dL — ABNORMAL HIGH (ref 0.50–1.10)
GFR calc Af Amer: 15 mL/min — ABNORMAL LOW (ref >90)
GFR calc non Af Amer: 13 mL/min — ABNORMAL LOW (ref >90)
GLUCOSE: 75 mg/dL (ref 70–99)
Phosphorus: 5.6 mg/dL — ABNORMAL HIGH (ref 2.3–4.6)
Potassium: 4 mEq/L (ref 3.7–5.3)
SODIUM: 142 meq/L (ref 137–147)

## 2014-01-20 LAB — CBC
HEMATOCRIT: 25.4 % — AB (ref 36.0–46.0)
HEMOGLOBIN: 7.9 g/dL — AB (ref 12.0–15.0)
MCH: 28.8 pg (ref 26.0–34.0)
MCHC: 31.1 g/dL (ref 30.0–36.0)
MCV: 92.7 fL (ref 78.0–100.0)
Platelets: 254 10*3/uL (ref 150–400)
RBC: 2.74 MIL/uL — ABNORMAL LOW (ref 3.87–5.11)
RDW: 15.6 % — ABNORMAL HIGH (ref 11.5–15.5)
WBC: 13.6 10*3/uL — ABNORMAL HIGH (ref 4.0–10.5)

## 2014-01-20 LAB — HEPATIC FUNCTION PANEL
ALBUMIN: 3 g/dL — AB (ref 3.5–5.2)
ALK PHOS: 69 U/L (ref 39–117)
ALT: 9 U/L (ref 0–35)
AST: 15 U/L (ref 0–37)
Bilirubin, Direct: <0.2 mg/dL (ref 0.0–0.3)
Total Bilirubin: 0.2 mg/dL — ABNORMAL LOW (ref 0.3–1.2)
Total Protein: 6.3 g/dL (ref 6.0–8.3)

## 2014-01-20 LAB — GLUCOSE, CAPILLARY
GLUCOSE-CAPILLARY: 50 mg/dL — AB (ref 70–99)
GLUCOSE-CAPILLARY: 68 mg/dL — AB (ref 70–99)
GLUCOSE-CAPILLARY: 80 mg/dL (ref 70–99)
Glucose-Capillary: 103 mg/dL — ABNORMAL HIGH (ref 70–99)
Glucose-Capillary: 125 mg/dL — ABNORMAL HIGH (ref 70–99)
Glucose-Capillary: 144 mg/dL — ABNORMAL HIGH (ref 70–99)
Glucose-Capillary: 150 mg/dL — ABNORMAL HIGH (ref 70–99)
Glucose-Capillary: 57 mg/dL — ABNORMAL LOW (ref 70–99)
Glucose-Capillary: 58 mg/dL — ABNORMAL LOW (ref 70–99)
Glucose-Capillary: 64 mg/dL — ABNORMAL LOW (ref 70–99)
Glucose-Capillary: 87 mg/dL (ref 70–99)

## 2014-01-20 MED ORDER — HEPARIN SODIUM (PORCINE) 1000 UNIT/ML DIALYSIS
20.0000 [IU]/kg | INTRAMUSCULAR | Status: DC | PRN
  Administered 2014-01-21: 3000 [IU] via INTRAVENOUS_CENTRAL

## 2014-01-20 MED ORDER — DEXTROSE 50 % IV SOLN
INTRAVENOUS | Status: AC
  Administered 2014-01-20: 08:00:00
  Filled 2014-01-20: qty 50

## 2014-01-20 MED ORDER — DEXTROSE 50 % IV SOLN
INTRAVENOUS | Status: AC
  Filled 2014-01-20: qty 50

## 2014-01-20 MED ORDER — DEXTROSE-NACL 5-0.9 % IV SOLN
INTRAVENOUS | Status: DC
  Administered 2014-01-20: 10:00:00 via INTRAVENOUS

## 2014-01-20 MED ORDER — HEPARIN SODIUM (PORCINE) 5000 UNIT/ML IJ SOLN
5000.0000 [IU] | Freq: Three times a day (TID) | INTRAMUSCULAR | Status: DC
  Administered 2014-01-20 – 2014-01-28 (×24): 5000 [IU] via SUBCUTANEOUS
  Filled 2014-01-20 (×27): qty 1

## 2014-01-20 MED ORDER — DEXTROSE 50 % IV SOLN
25.0000 mL | Freq: Once | INTRAVENOUS | Status: AC | PRN
  Administered 2014-01-20: 25 mL via INTRAVENOUS

## 2014-01-20 NOTE — Progress Notes (Signed)
Patient ID: Jacqueline [name], female   DOB: Sep 23, 1946, 68 y.o.   MRN: [id number] S:more awake today O:BP 132/48  Pulse 55  Temp(Src) 98.5 F (36.9 C) (Axillary)  Resp 19  Ht 5\' 5"  (1.651 m)  Wt 148.145 kg (326 lb 9.6 oz)  BMI 54.35 kg/m2  SpO2 96%  Intake/Output Summary (Last 24 hours) at 01/20/14 0838 Last data filed at 01/20/14 0800  Gross per 24 hour  Intake    907 ml  Output   2103 ml  Net  -1196 ml   Intake/Output: I/O last 3 completed shifts: In: [id number] [P.O.:60; I.V.:633; IV Piggyback:264] Out: 2703 [id number]; Other:1700; Stool:1]  Intake/Output this shift:  Total I/O In: 10 [I.V.:10] Out: 1 [Urine:1] Weight change:  Gen:WD WN morbidly obese AAF  [id number] HS, bradycardic, no rub Resp:decreased BS at bases [id number] Ext:2+ edema   Recent Labs Lab 01/15/14 1730 01/16/14 0400 01/17/14 0525 01/18/14 0430 01/18/14 1620 01/19/14 0401 01/20/14 0226  NA 143 146 139 141 142 140 142  K 4.5 5.0 5.3 5.7* 5.0 4.8 4.0  CL 102 105 100 100 101 100 99  CO2 26 27 27 26 28 27 28   GLUCOSE 199* 169* 240* 227* 118* 137* 75  BUN 53* 54* 60* 67* 73* 76* 61*  CREATININE 3.33* 3.38* 3.89* 4.31* 4.41* 4.39* 3.37*  ALBUMIN  --   --   --  3.1*  --  3.0* 2.7*  CALCIUM 8.4 8.4 8.2* 7.9* 7.7* 7.9* 7.6*  PHOS  --   --   --  5.9*  --  6.2* 5.6*   Liver Function Tests:  Recent Labs Lab 01/18/14 0430 01/19/14 0401 01/20/14 0226  ALBUMIN 3.1* 3.0* 2.7*   No results found for this basename: LIPASE, AMYLASE,  in the last 168 hours No results found for this basename: AMMONIA,  in the last 168 hours CBC:  Recent Labs Lab 01/15/14 1730 01/18/14 0430 01/19/14 0401 01/20/14 0226  WBC 10.4 10.7* 10.6* 13.6*  NEUTROABS 8.0*  --   --   --   HGB 9.0* 8.0* 8.2* 7.9*  HCT 28.9* 26.6* 26.8* 25.4*  MCV 92.3 95.3 93.7 92.7  PLT 294 295 287 254   Cardiac Enzymes: No results found for this basename: CKTOTAL, CKMB, CKMBINDEX, TROPONINI,  in the last 168 hours CBG:  Recent Labs Lab  01/19/14 2353 01/20/14 0058 01/20/14 0236 01/20/14 0406 01/20/14 0713  GLUCAP 50* 64* 68* 80 57*    Iron Studies:  Recent Labs  01/17/14 1715  IRON 30*  TIBC 218*  FERRITIN 78   Studies/Results: Dg Chest 1 View  01/19/2014   CLINICAL DATA:  Status post Diatek placement.  EXAM: CHEST - 1 VIEW  COMPARISON:  01/15/2014  FINDINGS: There has been interval placement of a right jugular central venous catheter with tip overlying the mid right atrium. The cardiac silhouette remains enlarged. Thoracic aortic calcification is present. No pneumothorax is identified. Lung volumes are diminished compared to the prior study with increased pulmonary vascular congestion and bibasilar opacities.  IMPRESSION: 1. Interval right jugular central venous catheter placement with tip overlying the right atrium. 2. Diminished lung volumes with mild pulmonary edema and increased bibasilar opacities, which may reflect atelectasis. These results were called by telephone at the time of interpretation on 01/19/2014 at 10:45 AM to Ashley Akin in the PACU who verbally acknowledged these results.   Electronically Signed   By: Logan Bores   On: 01/19/2014 10:45   . B-complex with vitamin C  1 tablet Oral Daily  . cholecalciferol  2,000 Units Oral Daily  . enoxaparin (LOVENOX) injection  30 mg Subcutaneous Q24H  . furosemide  160 mg Intravenous 3 times per day  . insulin aspart  0-15 Units Subcutaneous 6 times per day  . iron polysaccharides  150 mg Oral Daily  . metolazone  2.5 mg Oral Daily  . omega-3 acid ethyl esters  1 g Oral Daily  . prazosin  10 mg Oral QHS  . sodium chloride  3 mL Intravenous Q12H    BMET    Component Value Date/Time   NA 142 01/20/2014 0226   K 4.0 01/20/2014 0226   CL 99 01/20/2014 0226   CO2 28 01/20/2014 0226   GLUCOSE 75 01/20/2014 0226   BUN 61* 01/20/2014 0226   CREATININE 3.37* 01/20/2014 0226   CALCIUM 7.6* 01/20/2014 0226   GFRNONAA 13* 01/20/2014 0226   GFRAA 15* 01/20/2014 0226    CBC    Component Value Date/Time   WBC 13.6* 01/20/2014 0226   RBC 2.74* 01/20/2014 0226   RBC 3.00* 01/15/2014 2335   HGB 7.9* 01/20/2014 0226   HCT 25.4* 01/20/2014 0226   PLT 254 01/20/2014 0226   MCV 92.7 01/20/2014 0226   MCH 28.8 01/20/2014 0226   MCHC 31.1 01/20/2014 0226   RDW 15.6* 01/20/2014 0226   LYMPHSABS 1.5 01/15/2014 1730   MONOABS 0.5 01/15/2014 1730   EOSABS 0.3 01/15/2014 1730   BASOSABS 0.0 01/15/2014 1730     Assessment/Plan:  1. AKI/CKD in setting of acute decompensated diastolic CHF and IV diuresis with concomitant ARB therapy. She has had progressive CKD over the last 2 years and now with worsening salt and water excess as well as ACDz and SHPTH.  1. S/p HD yesterday and is on it currently. 2. Better BFR's today with HD cath 3. Has not responded with increased diuretics (failed on lasix 160 tid and metolazone). 4. Likely ESRD and will plan to cont with HD and arrange outpt HD 5. We discussed the chronicity and severity of her CKD and will need to pursue vein mapping and access placement as well as education about her treatment options and  6. Appreciate VVS placement of HD catheter (AVF on hold due to AMS) and hopefully can be done safely in a few days 2. Acute on chronic CHF, diastolic- not responding to high dose lasix.  1. Na restriction as well as fluid restriction (neither of which she had been doing at home) 2. Recommend dietician consult for weight loss as well as low Na diet 3. Nonadherent with low Na diet as well as fluid restriction 4. Question if she also has sleep apnea contributing to right-heart failure 5. Continue with HD and UF 3. HTN- better post diuresis 4. AMS due to CO2 retention- ABG showed PCO2 that rose from 44 to 54 to 84 s/p BiPap yesterday with marked improvement. Likely has component of OSA/OHS or Pickwickian syndrome and may benefit from CPAP or BiPap qhs.  1. PCCM following and will arrange nightly bipap and outpt evaluation 5. DM- per  primary svc 6. Obesity- will need dietician evaluation/education. 7. Anemia of chronic disease- Hgb has been trending down since January of 2014 and was 9.9 in Sept 2014.  1. low iron stores will give feraheme 1020mg  2. start ESA once CHF is under better control or once we start HD 8. SHPTH- has had an elevated iPTH of 149 in 1/14, will recheck but will likely require po vit  D 9. Vascular access- s/p RIJ PC 1. For LUE AVF once more stable 2. Plan for HD with UF to help improve overall medical condition.  [place] A

## 2014-01-20 NOTE — Progress Notes (Signed)
Pt with respiratory arrest intraoperatively yesterday.  She was extubated later than morning.  She has come off of bipap and is now on nasal cannula.  She is receiving dialysis via her diatek catheter this am.  Will reschedule permanent access placement when pt is more medically stable.   Leontine Locket 01/20/2014 8:38 AM

## 2014-01-20 NOTE — Anesthesia Postprocedure Evaluation (Signed)
  Anesthesia Post-op Note  Patient: Jacqueline [name]  Procedure(s) Performed: Procedure(s): INSERTION OF DIALYSIS CATHETER (N/A)  Patient Location: ICU  Anesthesia Type:General  Level of Consciousness: awake, alert , oriented and patient cooperative  Airway and Oxygen Therapy: Patient Spontanous Breathing and Patient connected to nasal cannula oxygen  Post-op Pain: none  Post-op Assessment: Post-op Vital signs reviewed, Patient's Cardiovascular Status Stable, Respiratory Function Stable, Patent Airway, No signs of Nausea or vomiting, Adequate PO intake and Pain level controlled  Post-op Vital Signs: Reviewed and stable  Complications: No apparent anesthesia complications

## 2014-01-20 NOTE — Progress Notes (Signed)
Hypoglycemic Event  CBG: 57  Treatment: D50 IV 50 mL  Symptoms: Pale, Sweaty and Nervous/irritable  Follow-up CBG: Time: [id number] CBG Result:  144  Possible Reasons for Event: Inadequate meal intake and Change in activity  Comments/MD notified: Dr. Titus Mould notified, IV fluids changed.     Jacqueline [name], Jacqueline [name]  Remember to initiate Hypoglycemia Order Set & complete

## 2014-01-20 NOTE — Progress Notes (Signed)
Name: Jacqueline [name] MRN: [id number] DOB: Mar 05, 1946    ADMISSION DATE:  01/15/2014 CONSULTATION DATE:  01/19/2014  REFERRING MD :  [facility] PRIMARY SERVICE: PCCM  CHIEF COMPLAINT:  Acute respiratory failure  BRIEF PATIENT DESCRIPTION: 68 yo obese with past medical history of ESRD brought to ICU after becoming apneic during vascular procedure, requiring brief intubation.  SIGNIFICANT EVENTS / STUDIES:  2/18 intubated briefly after surgery, on bipap  LINES / TUBES: R IJ tunneled HD cath 2/18 >>>  CULTURES: MRSA neg  ANTIBIOTICS: Ancef during surgery  SUBJECTIVE: low blood sugars last night. Given 3 amps dextrose.  VITAL SIGNS: Temp:  [97.3 F (36.3 C)-98.6 F (37 C)] 98.5 F (36.9 C) (02/19 0700) Pulse Rate:  [49-90] 59 (02/19 0745) Resp:  [14-29] 19 (02/19 0745) BP: (97-142)/(32-89) 127/49 mmHg (02/19 0745) SpO2:  [98 %-100 %] 100 % (02/19 0745) FiO2 (%):  [40 %-50 %] 40 % (02/19 0745) HEMODYNAMICS:   VENTILATOR SETTINGS: Vent Mode:  [-] SIMV;PSV FiO2 (%):  [40 %-50 %] 40 % Set Rate:  [10 bmp] 10 bmp Vt Set:  [500 mL] 500 mL PEEP:  [5 cmH20] 5 cmH20 Pressure Support:  [10 cmH20] 10 cmH20 Plateau Pressure:  [25 cmH20] 25 cmH20 INTAKE / OUTPUT: Intake/Output     02/18 0701 - 02/19 0700 02/19 0701 - 02/20 0700   P.O.     I.V. (mL/kg) 633 (4.3)    IV Piggyback 264    Total Intake(mL/kg) 897 (6.1)    Urine (mL/kg/hr) 402 (0.1)    Other 1700 (0.5)    Stool     Total Output 2102     Net -1205          Urine Occurrence 2 x    Stool Occurrence 2 x      PHYSICAL EXAMINATION: General: No acute distress  Neuro: alert, answers questions appropriately improved HEENT: bipap mask on, no distress Cardiovascular: RRR, distant heart sounds, no m/r/g, catheter site intact  Lungs: Bilateral diminished air entry  Abdomen: Obese, soft  Musculoskeletal: no defmormities noted  Skin: Intact  LABS:  CBC  Recent Labs Lab 01/18/14 0430 01/19/14 0401  01/20/14 0226  WBC 10.7* 10.6* 13.6*  HGB 8.0* 8.2* 7.9*  HCT 26.6* 26.8* 25.4*  PLT 295 287 254   Coag's No results found for this basename: APTT, INR,  in the last 168 hours BMET  Recent Labs Lab 01/18/14 1620 01/19/14 0401 01/20/14 0226  NA 142 140 142  K 5.0 4.8 4.0  CL 101 100 99  CO2 28 27 28   BUN 73* 76* 61*  CREATININE 4.41* 4.39* 3.37*  GLUCOSE 118* 137* 75   Electrolytes  Recent Labs Lab 01/18/14 0430 01/18/14 1620 01/19/14 0401 01/20/14 0226  CALCIUM 7.9* 7.7* 7.9* 7.6*  PHOS 5.9*  --  6.2* 5.6*   Sepsis Markers No results found for this basename: LATICACIDVEN, PROCALCITON, O2SATVEN,  in the last 168 hours ABG  Recent Labs Lab 01/18/14 1243 01/19/14 1238 01/19/14 1614  PHART 7.311* 7.213* 7.298*  PCO2ART 54.4* 68.1* 58.9*  PO2ART 82.9 64.0* 98.0   Liver Enzymes  Recent Labs Lab 01/18/14 0430 01/19/14 0401 01/20/14 0226  ALBUMIN 3.1* 3.0* 2.7*   Cardiac Enzymes  Recent Labs Lab 01/15/14 1730  PROBNP 4646.0*   Glucose  Recent Labs Lab 01/19/14 1904 01/19/14 2353 01/20/14 0058 01/20/14 0236 01/20/14 0406 01/20/14 0713  GLUCAP 102* 50* 64* 68* 80 57*    Imaging Dg Chest 1 View  01/19/2014  CLINICAL DATA:  Status post Diatek placement.  EXAM: CHEST - 1 VIEW  COMPARISON:  01/15/2014  FINDINGS: There has been interval placement of a right jugular central venous catheter with tip overlying the mid right atrium. The cardiac silhouette remains enlarged. Thoracic aortic calcification is present. No pneumothorax is identified. Lung volumes are diminished compared to the prior study with increased pulmonary vascular congestion and bibasilar opacities.  IMPRESSION: 1. Interval right jugular central venous catheter placement with tip overlying the right atrium. 2. Diminished lung volumes with mild pulmonary edema and increased bibasilar opacities, which may reflect atelectasis. These results were called by telephone at the time of  interpretation on 01/19/2014 at 10:45 AM to Ashley Akin in the PACU who verbally acknowledged these results.   Electronically Signed   By: Logan Bores   On: 01/19/2014 10:45     ASSESSMENT / PLAN:  PULMONARY  A:  Acute on chronic respiratory failure  Suspect OSA / OHS  Small lung volumes  Mild pulmonary edema   P:  Goal SpO2>92, pH>7.30  BiPAP - give trial off today now and assess clinically Will Rx empiric nocturnal autoset BIPAP - with concerns OHS large component   Continue neg balanceon hd pcxr in am  Will need sleep study eval in outpt would favor sending home on BIPAP  CARDIOVASCULAR  A:  HTN  P:  Goal BP<160/90 , re eval after HD Labetalol PRN  Hold Norvasc, Bidil, Bystolic, Minipress, Zocor until able take PO   RENAL  A:  ESRD  S/p tunneled HD cath placement  Metabolic acidosis  P:  Renal following - HD per renal for 3 liters off t5oday Trend BMP  Lasix on hold with pressures this am per HD nurse Hold Zaroxolyn   GASTROINTESTINAL  A:  High risk for aspiration  Nutrition  GI Px is not indicated  P:  Add diet  HEMATOLOGIC  A:  Chronic anemia of renal disease  VTE Px  P:  Trend CBC  Lovenox, would switch to heparin given renal function  INFECTIOUS  A:  No active issues  P:  No intervention required   ENDOCRINE  A:  DM  Hypoglycemia P:  SSI dc, re add if glu greater 200 x 2 Hold Glucotrol  Will give diet and add d5 in fluids at 30  LFT, blood cult, tsh, cortisol  NEUROLOGIC  A:  Acute encephalopathy  P:  Avoid sedatives / hypnotics resolved  [name], MD [facility] PGY-2  TODAY'S SUMMARY: trial off bipap, give diet, HD today, transfer to sdu, consider [facility] (was primary )  I have personally obtained a history, examined the patient, evaluated laboratory and imaging results, formulated the assessment and plan and placed orders.  [name]. Titus Mould, MD, [place] Pgr: [facility]  Pulmonary and [place] Pager: [phone]  01/20/2014, 7:58 AM

## 2014-01-20 NOTE — Procedures (Signed)
Patient was seen on dialysis and the procedure was supervised. BFR 250 Via RIJ PC BP is 132/48.  Patient appears to be tolerating treatment well.

## 2014-01-20 NOTE — Progress Notes (Signed)
Checked patient's blood sugar since she has been low through the night.  CBG of 57.  Given 25 ml of dextrose and plan to recheck.

## 2014-01-20 NOTE — Progress Notes (Signed)
CBG of 50.  Gave 25 ml of dextrose and plan to recheck.

## 2014-01-20 NOTE — Progress Notes (Signed)
Placed pt. On bipap. Pt. Is tolerating well at this time. RT to monitor.

## 2014-01-20 NOTE — Progress Notes (Signed)
Inpatient Diabetes Program Recommendations  AACE/ADA: New Consensus Statement on Inpatient Glycemic Control (2013)  Target Ranges:  Prepandial:   less than 140 mg/dL      Peak postprandial:   less than 180 mg/dL (1-2 hours)      Critically ill patients:  140 - 180 mg/dL   Reason for Assessment:  Hypoglycemia Results for Jacqueline [name], Jacqueline [name] (MRN [id number]) as of 01/20/2014 10:29  Ref. Range 01/20/2014 02:36 01/20/2014 04:06 01/20/2014 07:13 01/20/2014 07:36 01/20/2014 08:08  Glucose-Capillary Latest Range: 70-99 mg/dL 68 (L) 80 57 (L) 58 (L) 144 (H)   Results for Jacqueline [name] (MRN [id number]) as of 01/20/2014 10:29  Ref. Range 01/20/2014 02:26  BUN Latest Range: 6-23 mg/dL 61 (H)  Creatinine Latest Range: 0.50-1.10 mg/dL 3.37 (H)   Outpatient Diabetes medications: Glucotrol 10 mg bid-PO Current orders for Inpatient glycemic control: Note that Glucotrol stopped and correction Novolog stopped.  Agree.  Once CBG's greater than 180 mg/dL, may consider restarting Novolog sensitive correction tid with meals. Adah Perl, RN, BC-ADM Inpatient Diabetes Coordinator Pager [phone]

## 2014-01-20 NOTE — Progress Notes (Signed)
Subjective:  Patient undergoing hemodialysis.  Events that occurred yesterday noted.  Patient is presently extubated.  She looks very fatigued and tired. Objective:  Vital Signs in the last 24 hours: Temp:  [97.4 F (36.3 C)-98.6 F (37 C)] 98.2 F (36.8 C) (02/19 1233) Pulse Rate:  [49-90] 65 (02/19 1100) Resp:  [14-29] 21 (02/19 1100) BP: (111-150)/(38-89) 139/51 mmHg (02/19 1100) SpO2:  [94 %-100 %] 100 % (02/19 1100) FiO2 (%):  [40 %] 40 % (02/19 0800)  Intake/Output from previous day: 02/18 0701 - 02/19 0700 In: 897 [I.V.:633; IV Piggyback:264] Out: 2102 [Urine:402]  Physical Exam: General: Well built and Morbidly obese body habitus who is in mild respiratory distress, appears fatigued. Appears stated age. Alert Ox3.  There is no cyanosis. HEENT: normal limits. Difficult to make out JVD due to short neck.  CARDIAC EXAM: S1, S2 normal, no gallop present. No murmur. Distant heart sounds.  CHEST EXAM: No tenderness of chest wall. LUNGS: Clear to percuss and auscultate. Decreased breath sounds at the bases.  ABDOMEN: Large pannus present. No obvious hepatosplenomegaly. BS normal in all 4 quadrants. Abdomen is non-tender.  EXTREMITY: Full range of movementes, 2+ below the knee pitting non-tender edema, improved since 2 days ago.   NEUROLOGIC EXAM: Grossly intact without any focal deficits. Alert O x 3.  VASCULAR EXAM: No skin breakdown. Carotids normal. Extremities: Femoral pulse distant and feeble without bruit. Popliteal pulse feeble; Pedal pulse faint and difficult to feel. Otherwise No obvious prominent pulse felt in the abdomen. No varicose veins.   Lab Results:  Recent Labs  01/19/14 0401 01/20/14 0226  WBC 10.6* 13.6*  HGB 8.2* 7.9*  PLT 287 254    Recent Labs  01/19/14 0401 01/20/14 0226  NA 140 142  K 4.8 4.0  CL 100 99  CO2 27 28  GLUCOSE 137* 75  BUN 76* 61*  CREATININE 4.39* 3.37*   No results found for this basename: TROPONINI, CK, MB,  in the last 72  hours Hepatic Function Panel  Recent Labs  01/20/14 0912  PROT 6.3  ALBUMIN 3.0*  AST 15  ALT 9  ALKPHOS 69  BILITOT 0.2*  BILIDIR <0.2  IBILI NOT CALCULATED   No results found for this basename: CHOL,  in the last 72 hours No results found for this basename: PROTIME,  in the last 72 hours Lipid Panel  No results found for this basename: chol,  trig,  hdl,  cholhdl,  vldl,  ldlcalc    Scheduled Meds: . B-complex with vitamin C  1 tablet Oral Daily  . cholecalciferol  2,000 Units Oral Daily  . furosemide  160 mg Intravenous 3 times per day  . heparin subcutaneous  5,000 Units Subcutaneous 3 times per day  . iron polysaccharides  150 mg Oral Daily  . metolazone  2.5 mg Oral Daily  . omega-3 acid ethyl esters  1 g Oral Daily  . prazosin  10 mg Oral QHS  . sodium chloride  3 mL Intravenous Q12H   Continuous Infusions: . dextrose 5 % and 0.9% NaCl 30 mL/hr at 01/20/14 1004   PRN Meds:.sodium chloride, sodium chloride, sodium chloride, acetaminophen, albuterol, feeding supplement (NEPRO CARB STEADY), heparin, heparin, heparin, labetalol, lidocaine (PF), lidocaine-prilocaine, ondansetron (ZOFRAN) IV, pentafluoroprop-tetrafluoroeth, sodium chloride   Cardiac Studies: Echocardiogram 01/17/2013: Normal left ventricular systolic function, no significant diastolic dysfunction.No significant valvular abnormality, right ventricle appears to be mildly dilated with preserved right ventricular function.   EKG 01/15/2013: Normal sinus rhythm at  rate of 70 bpm, rightward axis, poor R-wave progression, borderline low voltage complexes.  Assessment/Plan:  1. Acute on chronic diastolic heart failure secondary to hypertension, morbid obesity, and chronic renal failure and diabetes mellitus.  2. Chronic renal failure, diabetic nephropathy, stage V 3. Morbid obesity, patient denies symptoms to suggest sleep apnea  4. Hypertension  5. Hyperlipidemia  6. Uncontrolled DM.  Recommendation: I  discussed the presentation of the patient with Dr. Donato Heinz, at this point her presentation is more consistent with renal failure leading to fluid overload state.  Not much tired from cardiac standpoint, hence I will sign off.  I'll be happy to revisit if she needs any cardiac problem to be addressed during this hospitalization.  I'll also be happy to see her back in the office if she were to have any significant cardiac issues.  Patient previously was advised to have a stress test in the outpatient basis, but she never followed up with me.    Laverda Page, M.D. 01/20/2014, 1:15 PM [facility] [place] Pager: [phone] Office: [phone] If no answer: [phone]

## 2014-01-21 ENCOUNTER — Encounter (HOSPITAL_COMMUNITY): Payer: Self-pay | Admitting: Vascular Surgery

## 2014-01-21 DIAGNOSIS — E662 Morbid (severe) obesity with alveolar hypoventilation: Secondary | ICD-10-CM

## 2014-01-21 DIAGNOSIS — G4733 Obstructive sleep apnea (adult) (pediatric): Secondary | ICD-10-CM

## 2014-01-21 LAB — UIFE/LIGHT CHAINS/TP QN, 24-HR UR
Albumin, U: DETECTED
Alpha 1, Urine: DETECTED — AB
Alpha 2, Urine: DETECTED — AB
BETA UR: DETECTED — AB
FREE KAPPA LT CHAINS, UR: 2.02 mg/dL (ref 0.14–2.42)
Free Kappa/Lambda Ratio: 11.22 ratio — ABNORMAL HIGH (ref 2.04–10.37)
Free Lambda Lt Chains,Ur: 0.18 mg/dL (ref 0.02–0.67)
GAMMA UR: DETECTED — AB
Total Protein, Urine: 13.7 mg/dL

## 2014-01-21 LAB — RENAL FUNCTION PANEL
ALBUMIN: 2.8 g/dL — AB (ref 3.5–5.2)
Albumin: 2.7 g/dL — ABNORMAL LOW (ref 3.5–5.2)
BUN: 40 mg/dL — ABNORMAL HIGH (ref 6–23)
BUN: 46 mg/dL — ABNORMAL HIGH (ref 6–23)
CHLORIDE: 97 meq/L (ref 96–112)
CO2: 28 meq/L (ref 19–32)
CO2: 29 mEq/L (ref 19–32)
CREATININE: 3.18 mg/dL — AB (ref 0.50–1.10)
Calcium: 8.1 mg/dL — ABNORMAL LOW (ref 8.4–10.5)
Calcium: 8.2 mg/dL — ABNORMAL LOW (ref 8.4–10.5)
Chloride: 99 mEq/L (ref 96–112)
Creatinine, Ser: 3.04 mg/dL — ABNORMAL HIGH (ref 0.50–1.10)
GFR calc Af Amer: 17 mL/min — ABNORMAL LOW (ref >90)
GFR calc Af Amer: 16 mL/min — ABNORMAL LOW (ref >90)
GFR calc non Af Amer: 15 mL/min — ABNORMAL LOW (ref >90)
GFR calc non Af Amer: 14 mL/min — ABNORMAL LOW (ref >90)
GLUCOSE: 141 mg/dL — AB (ref 70–99)
Glucose, Bld: 307 mg/dL — ABNORMAL HIGH (ref 70–99)
POTASSIUM: 3.8 meq/L (ref 3.7–5.3)
POTASSIUM: 3.8 meq/L (ref 3.7–5.3)
Phosphorus: 4.5 mg/dL (ref 2.3–4.6)
Phosphorus: 4.9 mg/dL — ABNORMAL HIGH (ref 2.3–4.6)
SODIUM: 141 meq/L (ref 137–147)
Sodium: 139 mEq/L (ref 137–147)

## 2014-01-21 LAB — CORTISOL: CORTISOL PLASMA: 21.2 ug/dL

## 2014-01-21 LAB — CBC
HCT: 24.3 % — ABNORMAL LOW (ref 36.0–46.0)
HCT: 25.2 % — ABNORMAL LOW (ref 36.0–46.0)
Hemoglobin: 7.5 g/dL — ABNORMAL LOW (ref 12.0–15.0)
Hemoglobin: 7.8 g/dL — ABNORMAL LOW (ref 12.0–15.0)
MCH: 28.5 pg (ref 26.0–34.0)
MCH: 28.6 pg (ref 26.0–34.0)
MCHC: 30.9 g/dL (ref 30.0–36.0)
MCHC: 31 g/dL (ref 30.0–36.0)
MCV: 92.4 fL (ref 78.0–100.0)
MCV: 92.3 fL (ref 78.0–100.0)
PLATELETS: 220 10*3/uL (ref 150–400)
PLATELETS: 212 10*3/uL (ref 150–400)
RBC: 2.63 MIL/uL — AB (ref 3.87–5.11)
RBC: 2.73 MIL/uL — ABNORMAL LOW (ref 3.87–5.11)
RDW: 15.7 % — ABNORMAL HIGH (ref 11.5–15.5)
RDW: 15.5 % (ref 11.5–15.5)
WBC: 13.1 10*3/uL — AB (ref 4.0–10.5)
WBC: 15.4 10*3/uL — AB (ref 4.0–10.5)

## 2014-01-21 LAB — PROTEIN ELECTROPHORESIS, SERUM
Albumin ELP: 49.5 % — ABNORMAL LOW (ref 55.8–66.1)
Alpha-1-Globulin: 9 % — ABNORMAL HIGH (ref 2.9–4.9)
Alpha-2-Globulin: 13.5 % — ABNORMAL HIGH (ref 7.1–11.8)
BETA GLOBULIN: 8.2 % — AB (ref 4.7–7.2)
Beta 2: 7.5 % — ABNORMAL HIGH (ref 3.2–6.5)
GAMMA GLOBULIN: 12.3 % (ref 11.1–18.8)
M-SPIKE, %: NOT DETECTED g/dL
TOTAL PROTEIN ELP: 6 g/dL (ref 6.0–8.3)

## 2014-01-21 LAB — GLUCOSE, CAPILLARY
GLUCOSE-CAPILLARY: 150 mg/dL — AB (ref 70–99)
GLUCOSE-CAPILLARY: 276 mg/dL — AB (ref 70–99)
Glucose-Capillary: 164 mg/dL — ABNORMAL HIGH (ref 70–99)
Glucose-Capillary: 333 mg/dL — ABNORMAL HIGH (ref 70–99)
Glucose-Capillary: 140 mg/dL — ABNORMAL HIGH (ref 70–99)

## 2014-01-21 LAB — TSH: TSH: 1.567 u[IU]/mL (ref 0.350–4.500)

## 2014-01-21 MED ORDER — INSULIN ASPART 100 UNIT/ML ~~LOC~~ SOLN
0.0000 [IU] | Freq: Three times a day (TID) | SUBCUTANEOUS | Status: DC
  Administered 2014-01-22: 8 [IU] via SUBCUTANEOUS
  Administered 2014-01-22: 5 [IU] via SUBCUTANEOUS
  Administered 2014-01-22 – 2014-01-23 (×3): 3 [IU] via SUBCUTANEOUS
  Administered 2014-01-23 – 2014-01-24 (×2): 2 [IU] via SUBCUTANEOUS
  Administered 2014-01-25: 5 [IU] via SUBCUTANEOUS
  Administered 2014-01-25: 3 [IU] via SUBCUTANEOUS
  Administered 2014-01-25: 5 [IU] via SUBCUTANEOUS
  Administered 2014-01-26: 3 [IU] via SUBCUTANEOUS
  Administered 2014-01-26: 2 [IU] via SUBCUTANEOUS
  Administered 2014-01-26 – 2014-01-27 (×3): 3 [IU] via SUBCUTANEOUS
  Administered 2014-01-28: 2 [IU] via SUBCUTANEOUS

## 2014-01-21 MED ORDER — INSULIN ASPART 100 UNIT/ML ~~LOC~~ SOLN
0.0000 [IU] | Freq: Every day | SUBCUTANEOUS | Status: DC
  Administered 2014-01-21: 3 [IU] via SUBCUTANEOUS
  Administered 2014-01-23 – 2014-01-24 (×2): 2 [IU] via SUBCUTANEOUS

## 2014-01-21 NOTE — Progress Notes (Signed)
Patient ID: Jacqueline [name], female   DOB: 14-Jul-1946, 68 y.o.   MRN: [id number] Patient is having except double hemodialysis via her catheter. I did discuss with her the need for long-term access. I explained that the next attempt would be left upper arm fistula creation. I offered this for this coming Monday. She does not want to proceed with any surgery at this time and reports that she was to get over her current illness before this. I feel this is appropriate as long as her catheter is functioning. We will continue to follow from the side lines and determine when she is ready to proceed with left upper arm AV fistula creation. She still is quite somnolent and frequently has to be aroused from her sleep when discussing these with her.

## 2014-01-21 NOTE — Progress Notes (Signed)
[place] TEAM 1 - Stepdown/ICU TEAM Progress Note  Jacqueline [name] [id number] DOB: [birth year] DOA: 01/15/2014 PCP: Salena Saner., MD  Admit HPI / Brief Narrative: 68 y.o. female with a history of HTN, DM2, Hyperlipidemia and CKD, who presented to the ED with complaints of worsening SOB, and swelling of both of her lower legs over the past month. In the ED she was found to be in volume overload.  After she was admitted a diagnosis of AKI on CKD was made, and it became clear she would need HD for volume removal  The pt was taken to the OR on 2/18 for placement of a R IJ HD catheter.  After the OR procedue she had to be taken to the ICU after becoming apneic during the vascular procedure, requiring brief intubation.   SIGNIFICANT EVENTS / STUDIES:  2/18 R IJ tunneled HD cath  2/18 intubated briefly after surgery  HPI/Subjective: The patient is somewhat somnolent but able to answer all questions.  She denies significant problems at the current time.  She denies chest pain nausea vomiting abdominal pain or shortness of breath.  She states that she is feeling much better in general.  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 Nutrition consult   Anemia of chronic kidney disease  Fe and epo per Nephrology  Code Status:  FULL Family Communication: no family present at time of exam Disposition Plan: SDU  Consultants: [facility] Nephrology PCCM  Antibiotics: None  DVT prophylaxis: SQ heparin   Objective: Blood pressure 154/69, pulse 63, temperature 98.6 F (37 C), temperature source Oral, resp. rate 19, height 5\' 5"  (1.651 m), weight 143.8 kg (317 lb 0.3 oz), SpO2 100.00%.  Intake/Output Summary (Last 24 hours) at 01/21/14 1706 Last data filed at 01/21/14 1200  Gross per 24 hour  Intake   1382 ml  Output    830 ml  Net    552 ml   Exam: General: No acute respiratory distress but lethargic Lungs: Poor air movement/distant breath sounds throughout all fields with no appreciable wheezes Cardiovascular: Distant heart sounds - regular rate and rhythm - no appreciable murmur Abdomen: Morbidly obese, nontender, nondistended, soft, bowel sounds positive, no rebound, no ascites, no appreciable mass Extremities: No significant cyanosis, or clubbing;  2+ edema bilateral lower extremities  Data Reviewed: Basic Metabolic Panel:  Recent Labs Lab 01/18/14 0430 01/18/14 1620 01/19/14 0401 01/20/14 0226 01/21/14 0235 01/21/14 1300  NA 141 142 140 142 141 139  K 5.7* 5.0 4.8 4.0 3.8 3.8  CL 100 101 100 99 99 97  CO2 26 28 27 28 28 29   GLUCOSE 227* 118* 137* 75 141* 307*  BUN 67* 73* 76* 61* 40* 46*  CREATININE 4.31* 4.41* 4.39* 3.37* 3.04*  3.18*  CALCIUM 7.9* 7.7* 7.9* 7.6* 8.1* 8.2*  PHOS 5.9*  --  6.2* 5.6* 4.5 4.9*   Liver Function Tests:  Recent Labs Lab 01/19/14 0401 01/20/14 0226 01/20/14 0912 01/21/14 0235 01/21/14 1300  AST  --   --  15  --   --   ALT  --   --  9  --   --   ALKPHOS  --   --  69  --   --   BILITOT  --   --  0.2*  --   --   PROT  --   --  6.3  --   --   ALBUMIN 3.0* 2.7* 3.0* 2.7* 2.8*   CBC:  Recent Labs Lab 01/15/14 1730 01/18/14 0430 01/19/14 0401 01/20/14 0226 01/21/14 0235 01/21/14 1300  WBC 10.4 10.7* 10.6* 13.6* 13.1* 15.4*  NEUTROABS 8.0*  --   --    --   --   --   HGB 9.0* 8.0* 8.2* 7.9* 7.5* 7.8*  HCT 28.9* 26.6* 26.8* 25.4* 24.3* 25.2*  MCV 92.3 95.3 93.7 92.7 92.4 92.3  PLT 294 295 287 254 220 212   CBG:  Recent Labs Lab 01/20/14 1952 01/20/14 2318 01/21/14 0345 01/21/14 0824 01/21/14 1238  GLUCAP 125* 150* 150* 164* 333*    Recent Results (from the past 240 hour(s))  MRSA PCR SCREENING     Status: None   Collection Time    01/19/14 11:38 AM      Result Value Ref Range Status   MRSA by PCR NEGATIVE  NEGATIVE Final   Comment:            The GeneXpert MRSA Assay (FDA     approved for NASAL specimens     only), is one component of a     comprehensive MRSA colonization     surveillance program. It is not     intended to diagnose MRSA     infection nor to guide or     monitor treatment for     MRSA infections.  CULTURE, BLOOD (ROUTINE X 2)     Status: None   Collection Time    01/20/14  8:47 AM      Result Value Ref Range Status   Specimen Description BLOOD RIGHT ARM   Final   Special Requests BOTTLES DRAWN AEROBIC AND ANAEROBIC 5MLS   Final   Culture  Setup Time     Final   Value: 01/20/2014 12:18     Performed at [facility]   Culture     Final   Value:        BLOOD CULTURE RECEIVED NO GROWTH TO DATE CULTURE WILL BE HELD FOR 5 DAYS BEFORE ISSUING A FINAL NEGATIVE REPORT     Performed at [facility]   Report Status PENDING   Incomplete  CULTURE, BLOOD (ROUTINE X 2)     Status: None   Collection Time    01/20/14  9:02 AM      Result Value Ref Range Status   Specimen Description BLOOD HEMODIALYSIS CATHETER   Final   Special Requests BOTTLES DRAWN AEROBIC AND ANAEROBIC 5CC   Final   Culture  Setup Time     Final   Value: 01/20/2014 13:57     Performed at [facility]   Culture     Final   Value:        BLOOD CULTURE RECEIVED NO GROWTH TO DATE CULTURE WILL BE HELD  FOR 5 DAYS BEFORE ISSUING A FINAL NEGATIVE REPORT     Performed at [facility]   Report Status PENDING    Incomplete     Studies:  Recent x-ray studies have been reviewed in detail by the Attending Physician  Scheduled Meds:  Scheduled Meds: . B-complex with vitamin C  1 tablet Oral Daily  . cholecalciferol  2,000 Units Oral Daily  . furosemide  160 mg Intravenous 3 times per day  . heparin subcutaneous  5,000 Units Subcutaneous 3 times per day  . iron polysaccharides  150 mg Oral Daily  . metolazone  2.5 mg Oral Daily  . omega-3 acid ethyl esters  1 g Oral Daily  . prazosin  10 mg Oral QHS  . sodium chloride  3 mL Intravenous Q12H    Time spent on care of this patient: 35 mins   [place]  [phone] Pager - Text Page per Shea Evans as per below:  On-Call/Text Page:      [email or web address]      password [email or web address]  If 7PM-7AM, please contact night-coverage [email or web address] Password [email or web address] 01/21/2014, 5:06 PM   LOS: 6 days

## 2014-01-21 NOTE — Progress Notes (Signed)
Patient ID: Jacqueline [name], female   DOB: 02/23/1946, 68 y.o.   MRN: [id number] S:feels better O:BP 113/51  Pulse 79  Temp(Src) 99.8 F (37.7 C) (Oral)  Resp 20  Ht 5\' 5"  (1.651 m)  Wt 141.6 kg (312 lb 2.7 oz)  BMI 51.95 kg/m2  SpO2 100%  Intake/Output Summary (Last 24 hours) at 01/21/14 1214 Last data filed at 01/21/14 0900  Gross per 24 hour  Intake   1328 ml  Output    630 ml  Net    698 ml   Intake/Output: I/O last 3 completed shifts: In: [id number] [P.O.:320; I.V.:781; IV Piggyback:264] Out: [id number] [Urine:631; Other:3001]  Intake/Output this shift:  Total I/O In: 240 [P.O.:210; I.V.:30] Out: 200 [Urine:200] Weight change:  Gen:WD WN obese AAF sitting up in chair [id number] HS Resp:decreased BS at bases [id number], NT Ext:2-3+ pitting edema   Recent Labs Lab 01/16/14 0400 01/17/14 0525 01/18/14 0430 01/18/14 1620 01/19/14 0401 01/20/14 0226 01/20/14 0912 01/21/14 0235  NA 146 139 141 142 140 142  --  141  K 5.0 5.3 5.7* 5.0 4.8 4.0  --  3.8  CL 105 100 100 101 100 99  --  99  CO2 27 27 26 28 27 28   --  28  GLUCOSE 169* 240* 227* 118* 137* 75  --  141*  BUN 54* 60* 67* 73* 76* 61*  --  40*  CREATININE 3.38* 3.89* 4.31* 4.41* 4.39* 3.37*  --  3.04*  ALBUMIN  --   --  3.1*  --  3.0* 2.7* 3.0* 2.7*  CALCIUM 8.4 8.2* 7.9* 7.7* 7.9* 7.6*  --  8.1*  PHOS  --   --  5.9*  --  6.2* 5.6*  --  4.5  AST  --   --   --   --   --   --  15  --   ALT  --   --   --   --   --   --  9  --    Liver Function Tests:  Recent Labs Lab 01/20/14 0226 01/20/14 0912 01/21/14 0235  AST  --  15  --   ALT  --  9  --   ALKPHOS  --  69  --   BILITOT  --  0.2*  --   PROT  --  6.3  --   ALBUMIN 2.7* 3.0* 2.7*   No results found for this basename: LIPASE, AMYLASE,  in the last 168 hours No results found for this basename: AMMONIA,  in the last 168 hours CBC:  Recent Labs Lab 01/15/14 1730 01/18/14 0430 01/19/14 0401 01/20/14 0226 01/21/14 0235  WBC 10.4 10.7* 10.6* 13.6* 13.1*   NEUTROABS 8.0*  --   --   --   --   HGB 9.0* 8.0* 8.2* 7.9* 7.5*  HCT 28.9* 26.6* 26.8* 25.4* 24.3*  MCV 92.3 95.3 93.7 92.7 92.4  PLT 294 295 287 254 220   Cardiac Enzymes: No results found for this basename: CKTOTAL, CKMB, CKMBINDEX, TROPONINI,  in the last 168 hours CBG:  Recent Labs Lab 01/20/14 1541 01/20/14 1952 01/20/14 2318 01/21/14 0345 01/21/14 0824  GLUCAP 103* 125* 150* 150* 164*    Iron Studies: No results found for this basename: IRON, TIBC, TRANSFERRIN, FERRITIN,  in the last 72 hours Studies/Results: No results found. . B-complex with vitamin C  1 tablet Oral Daily  . cholecalciferol  2,000 Units Oral Daily  . furosemide  160 mg Intravenous 3  times per day  . heparin subcutaneous  5,000 Units Subcutaneous 3 times per day  . iron polysaccharides  150 mg Oral Daily  . metolazone  2.5 mg Oral Daily  . omega-3 acid ethyl esters  1 g Oral Daily  . prazosin  10 mg Oral QHS  . sodium chloride  3 mL Intravenous Q12H    BMET    Component Value Date/Time   NA 141 01/21/2014 0235   K 3.8 01/21/2014 0235   CL 99 01/21/2014 0235   CO2 28 01/21/2014 0235   GLUCOSE 141* 01/21/2014 0235   BUN 40* 01/21/2014 0235   CREATININE 3.04* 01/21/2014 0235   CALCIUM 8.1* 01/21/2014 0235   GFRNONAA 15* 01/21/2014 0235   GFRAA 17* 01/21/2014 0235   CBC    Component Value Date/Time   WBC 13.1* 01/21/2014 0235   RBC 2.63* 01/21/2014 0235   RBC 3.00* 01/15/2014 2335   HGB 7.5* 01/21/2014 0235   HCT 24.3* 01/21/2014 0235   PLT 220 01/21/2014 0235   MCV 92.4 01/21/2014 0235   MCH 28.5 01/21/2014 0235   MCHC 30.9 01/21/2014 0235   RDW 15.7* 01/21/2014 0235   LYMPHSABS 1.5 01/15/2014 1730   MONOABS 0.5 01/15/2014 1730   EOSABS 0.3 01/15/2014 1730   BASOSABS 0.0 01/15/2014 1730     Assessment/Plan:  1. Progressive CKD due to DM, HTN, CHF who now appears to be at ESRD.    1. S/p HD for the last 2 days and is due again today with marked improvement in volume and resp status. 2. Has not  responded with increased diuretics (failed on lasix 160 tid and metolazone). 3. Outpt HD tentatively [facility] T[facility] but will need to see if she will require SNF or CIR prior to d/c home and she will need AVF before discharge. 4. We discussed the chronicity and severity of her CKD and will need to pursue vein mapping and access placement as well as education about her treatment options  2. 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  1. Na restriction as well as fluid restriction (neither of which she had been doing at home) 2. Recommend dietician consult for weight loss as well as low Na diet 3. Nonadherent with low Na diet as well as fluid restriction 4. Question if she also has sleep apnea contributing to right-heart failure 5. Continue with HD and UF 3. HTN- better post diuresis 4. AMS due to CO2 retention- ABG showed PCO2 that rose from 44 to 54 to 84 s/p BiPap yesterday with marked improvement. Likely has component of OSA/OHS or Pickwickian syndrome and may benefit from CPAP or BiPap qhs.  1. PCCM following and will arrange nightly bipap and outpt evaluation 5. DM- per primary svc 6. Obesity- will need dietician evaluation/education. 7. Anemia of chronic disease- Hgb has been trending down since January of 2014 and was 9.9 in Sept 2014.  1. low iron stores will give feraheme 1020mg  2. start ESA once CHF is under better control or once we start HD 8. SHPTH- has had an elevated iPTH of 149 in 1/14, will recheck but will likely require po vit D 9. Vascular access- s/p RIJ PC  1. For LUE AVF once more stable 2. Plan for HD with UF to help improve overall medical condition before proceeding with AVF.  3. Will need access before she can go to outpt unit.  [place] A

## 2014-01-21 NOTE — Progress Notes (Signed)
Pt. Is refusing bipap at this time. Pt. Understands if she gets in distress, she will need to wear the bipap. RT to consult with RN.

## 2014-01-21 NOTE — Progress Notes (Signed)
Pulmonary/Critical Care Progress Note   Name: Jacqueline [name] MRN: [id number] DOB: 01/04/1946    ADMISSION DATE:  01/15/2014 CONSULTATION DATE:  01/19/2014  REFERRING MD :  [facility] PRIMARY SERVICE: PCCM  CHIEF COMPLAINT:  Acute respiratory failure  BRIEF PATIENT DESCRIPTION: 69 yo obese with past medical history of ESRD brought to ICU after becoming apneic during vascular procedure, requiring brief intubation.  SIGNIFICANT EVENTS / STUDIES:  2/18 intubated briefly after surgery, on bipap  LINES / TUBES: R IJ tunneled HD cath 2/18 >>>  CULTURES: MRSA neg  ANTIBIOTICS: Ancef during surgery  SUBJECTIVE: No events overnight, used BiPAP overnight intermittently.  VITAL SIGNS: Temp:  [97.4 F (36.3 C)-100.2 F (37.9 C)] 99.8 F (37.7 C) (02/20 0825) Pulse Rate:  [55-83] 79 (02/20 0825) Resp:  [16-28] 20 (02/20 0825) BP: (113-158)/(18-67) 113/51 mmHg (02/20 0825) SpO2:  [98 %-100 %] 100 % (02/20 0825) FiO2 (%):  [40 %] 40 % (02/20 0000) Weight:  [141.6 kg (312 lb 2.7 oz)] 141.6 kg (312 lb 2.7 oz) (02/20 0500) HEMODYNAMICS:   VENTILATOR SETTINGS: Vent Mode:  [-]  FiO2 (%):  [40 %] 40 % INTAKE / OUTPUT: Intake/Output     02/19 0701 - 02/20 0700 02/20 0701 - 02/21 0700   P.O. 320 210   I.V. (mL/kg) 658 (4.6) 30 (0.2)   IV Piggyback 198    Total Intake(mL/kg) 1176 (8.3) 240 (1.7)   Urine (mL/kg/hr) 431 (0.1) 200 (0.4)   Other 3001 (0.9)    Total Output 3432 200   Net -2256 +40          PHYSICAL EXAMINATION: General: No acute distress  Neuro: alert, answers questions appropriately improved HEENT: bipap mask on, no distress Cardiovascular: RRR, distant heart sounds, no m/r/g, catheter site intact  Lungs: Bilateral diminished air entry  Abdomen: Obese, soft  Musculoskeletal: no defmormities noted  Skin: Intact  LABS:  CBC  Recent Labs Lab 01/19/14 0401 01/20/14 0226 01/21/14 0235  WBC 10.6* 13.6* 13.1*  HGB 8.2* 7.9* 7.5*  HCT 26.8* 25.4* 24.3*  PLT 287  254 220   Coag's No results found for this basename: APTT, INR,  in the last 168 hours BMET  Recent Labs Lab 01/19/14 0401 01/20/14 0226 01/21/14 0235  NA 140 142 141  K 4.8 4.0 3.8  CL 100 99 99  CO2 27 28 28   BUN 76* 61* 40*  CREATININE 4.39* 3.37* 3.04*  GLUCOSE 137* 75 141*   Electrolytes  Recent Labs Lab 01/19/14 0401 01/20/14 0226 01/21/14 0235  CALCIUM 7.9* 7.6* 8.1*  PHOS 6.2* 5.6* 4.5   Sepsis Markers No results found for this basename: LATICACIDVEN, PROCALCITON, O2SATVEN,  in the last 168 hours ABG  Recent Labs Lab 01/18/14 1243 01/19/14 1238 01/19/14 1614  PHART 7.311* 7.213* 7.298*  PCO2ART 54.4* 68.1* 58.9*  PO2ART 82.9 64.0* 98.0   Liver Enzymes  Recent Labs Lab 01/20/14 0226 01/20/14 0912 01/21/14 0235  AST  --  15  --   ALT  --  9  --   ALKPHOS  --  69  --   BILITOT  --  0.2*  --   ALBUMIN 2.7* 3.0* 2.7*   Cardiac Enzymes  Recent Labs Lab 01/15/14 1730  PROBNP 4646.0*   Glucose  Recent Labs Lab 01/20/14 1148 01/20/14 1541 01/20/14 1952 01/20/14 2318 01/21/14 0345 01/21/14 0824  GLUCAP 87 103* 125* 150* 150* 164*    Imaging Dg Chest 1 View  01/19/2014   CLINICAL DATA:  Status post  Diatek placement.  EXAM: CHEST - 1 VIEW  COMPARISON:  01/15/2014  FINDINGS: There has been interval placement of a right jugular central venous catheter with tip overlying the mid right atrium. The cardiac silhouette remains enlarged. Thoracic aortic calcification is present. No pneumothorax is identified. Lung volumes are diminished compared to the prior study with increased pulmonary vascular congestion and bibasilar opacities.  IMPRESSION: 1. Interval right jugular central venous catheter placement with tip overlying the right atrium. 2. Diminished lung volumes with mild pulmonary edema and increased bibasilar opacities, which may reflect atelectasis. These results were called by telephone at the time of interpretation on 01/19/2014 at 10:45 AM to  Ashley Akin in the PACU who verbally acknowledged these results.   Electronically Signed   By: Logan Bores   On: 01/19/2014 10:45     ASSESSMENT / PLAN:  PULMONARY  A:  Acute on chronic respiratory failure  Suspect OSA / OHS  Small lung volumes  Mild pulmonary edema   P:  - Auto-set BiPAP QHS. - Will need f/u with a sleep MD as outpatient for a formal sleep study. - Titrate O2 for sats. - Recommend weight loss. - All other medical concerns per primary team. - PCCM will sign off, please call back if needed.  Rush Farmer, M.D. [facility] Pulmonary/Critical Care Medicine. Pager: [phone]. After hours pager: [phone].

## 2014-01-22 LAB — GLUCOSE, CAPILLARY
GLUCOSE-CAPILLARY: 267 mg/dL — AB (ref 70–99)
GLUCOSE-CAPILLARY: 157 mg/dL — AB (ref 70–99)
Glucose-Capillary: 191 mg/dL — ABNORMAL HIGH (ref 70–99)
Glucose-Capillary: 211 mg/dL — ABNORMAL HIGH (ref 70–99)
Glucose-Capillary: 233 mg/dL — ABNORMAL HIGH (ref 70–99)

## 2014-01-22 LAB — CBC
HCT: 26.1 % — ABNORMAL LOW (ref 36.0–46.0)
Hemoglobin: 7.9 g/dL — ABNORMAL LOW (ref 12.0–15.0)
MCH: 28.4 pg (ref 26.0–34.0)
MCHC: 30.3 g/dL (ref 30.0–36.0)
MCV: 93.9 fL (ref 78.0–100.0)
Platelets: 213 10*3/uL (ref 150–400)
RBC: 2.78 MIL/uL — ABNORMAL LOW (ref 3.87–5.11)
RDW: 15.5 % (ref 11.5–15.5)
WBC: 15.2 10*3/uL — AB (ref 4.0–10.5)

## 2014-01-22 LAB — RENAL FUNCTION PANEL
Albumin: 2.8 g/dL — ABNORMAL LOW (ref 3.5–5.2)
BUN: 29 mg/dL — AB (ref 6–23)
CO2: 29 mEq/L (ref 19–32)
Calcium: 8.2 mg/dL — ABNORMAL LOW (ref 8.4–10.5)
Chloride: 101 mEq/L (ref 96–112)
Creatinine, Ser: 2.88 mg/dL — ABNORMAL HIGH (ref 0.50–1.10)
GFR calc Af Amer: 18 mL/min — ABNORMAL LOW (ref >90)
GFR calc non Af Amer: 16 mL/min — ABNORMAL LOW (ref >90)
Glucose, Bld: 192 mg/dL — ABNORMAL HIGH (ref 70–99)
POTASSIUM: 4 meq/L (ref 3.7–5.3)
Phosphorus: 3.9 mg/dL (ref 2.3–4.6)
SODIUM: 142 meq/L (ref 137–147)

## 2014-01-22 LAB — URIC ACID: Uric Acid, Serum: 4.4 mg/dL (ref 2.4–7.0)

## 2014-01-22 MED ORDER — INSULIN GLARGINE 100 UNIT/ML ~~LOC~~ SOLN
20.0000 [IU] | Freq: Every day | SUBCUTANEOUS | Status: DC
  Administered 2014-01-22 – 2014-01-23 (×2): 20 [IU] via SUBCUTANEOUS
  Filled 2014-01-22 (×3): qty 0.2

## 2014-01-22 NOTE — Progress Notes (Signed)
Pt. Decided to go on bipap for the night. Pt.is tolerating well at this time.RT to monitor.

## 2014-01-22 NOTE — Progress Notes (Signed)
Nutrition Brief Note  RD consulted for Renal diet education.  Patient not appropriate for education at this time; in and out of sleep and unable to maintain alertness.  RD to return at later date.  Arthur Holms, RD, LDN Pager #: [phone] After-Hours Pager #: [phone]

## 2014-01-22 NOTE — Progress Notes (Signed)
Pt. Requested to come off bipap. RN took pt. Off bipap and placed pt. Back on nasal cannula. RN stated that the pt. Became more agitated and intolerable of the bipap.

## 2014-01-22 NOTE — Progress Notes (Signed)
Patient ID: Jacqueline [name], female   DOB: 1946-11-23, 68 y.o.   MRN: [id number] S:somnolent and without complaints O:BP 140/45  Pulse 73  Temp(Src) 99.1 F (37.3 C) (Oral)  Resp 25  Ht 5\' 5"  (1.651 m)  Wt 139.8 kg (308 lb 3.3 oz)  BMI 51.29 kg/m2  SpO2 100%  Intake/Output Summary (Last 24 hours) at 01/22/14 1230 Last data filed at 01/22/14 0800  Gross per 24 hour  Intake    728 ml  Output   3640 ml  Net  -2912 ml   Intake/Output: I/O last 3 completed shifts: In: 2140 [P.O.:990; I.V.:820; IV Piggyback:330] Out: [id number] [Urine:940; Other:3500]  Intake/Output this shift:  Total I/O In: 10 [I.V.:10] Out: 30 [Urine:30] Weight change: 2.2 kg (4 lb 13.6 oz) [id number] obese, hypersomnolent HS:[id number] HS Resp:decreased BS [id number] Ext:2+ edema   Recent Labs Lab 01/17/14 0525 01/18/14 0430 01/18/14 1620 01/19/14 0401 01/20/14 0226 01/20/14 0912 01/21/14 0235 01/21/14 1300 01/22/14 0323  NA 139 141 142 140 142  --  141 139 142  K 5.3 5.7* 5.0 4.8 4.0  --  3.8 3.8 4.0  CL 100 100 101 100 99  --  99 97 101  CO2 27 26 28 27 28   --  28 29 29   GLUCOSE 240* 227* 118* 137* 75  --  141* 307* 192*  BUN 60* 67* 73* 76* 61*  --  40* 46* 29*  CREATININE 3.89* 4.31* 4.41* 4.39* 3.37*  --  3.04* 3.18* 2.88*  ALBUMIN  --  3.1*  --  3.0* 2.7* 3.0* 2.7* 2.8* 2.8*  CALCIUM 8.2* 7.9* 7.7* 7.9* 7.6*  --  8.1* 8.2* 8.2*  PHOS  --  5.9*  --  6.2* 5.6*  --  4.5 4.9* 3.9  AST  --   --   --   --   --  15  --   --   --   ALT  --   --   --   --   --  9  --   --   --    Liver Function Tests:  Recent Labs Lab 01/20/14 0912 01/21/14 0235 01/21/14 1300 01/22/14 0323  AST 15  --   --   --   ALT 9  --   --   --   ALKPHOS 69  --   --   --   BILITOT 0.2*  --   --   --   PROT 6.3  --   --   --   ALBUMIN 3.0* 2.7* 2.8* 2.8*   No results found for this basename: LIPASE, AMYLASE,  in the last 168 hours No results found for this basename: AMMONIA,  in the last 168 hours CBC:  Recent  Labs Lab 01/15/14 1730  01/19/14 0401 01/20/14 0226 01/21/14 0235 01/21/14 1300 01/22/14 0323  WBC 10.4  < > 10.6* 13.6* 13.1* 15.4* 15.2*  NEUTROABS 8.0*  --   --   --   --   --   --   HGB 9.0*  < > 8.2* 7.9* 7.5* 7.8* 7.9*  HCT 28.9*  < > 26.8* 25.4* 24.3* 25.2* 26.1*  MCV 92.3  < > 93.7 92.7 92.4 92.3 93.9  PLT 294  < > 287 254 220 212 213  < > = values in this interval not displayed. Cardiac Enzymes: No results found for this basename: CKTOTAL, CKMB, CKMBINDEX, TROPONINI,  in the last 168 hours CBG:  Recent Labs Lab 01/21/14 1238 01/21/14 1753  01/21/14 2143 01/22/14 0752 01/22/14 1137  GLUCAP 333* 140* 276* 191* 211*    Iron Studies: No results found for this basename: IRON, TIBC, TRANSFERRIN, FERRITIN,  in the last 72 hours Studies/Results: No results found. . B-complex with vitamin C  1 tablet Oral Daily  . cholecalciferol  2,000 Units Oral Daily  . furosemide  160 mg Intravenous 3 times per day  . heparin subcutaneous  5,000 Units Subcutaneous 3 times per day  . insulin aspart  0-15 Units Subcutaneous TID WC  . insulin aspart  0-5 Units Subcutaneous QHS  . insulin glargine  20 Units Subcutaneous QHS  . iron polysaccharides  150 mg Oral Daily  . metolazone  2.5 mg Oral Daily  . omega-3 acid ethyl esters  1 g Oral Daily  . prazosin  10 mg Oral QHS  . sodium chloride  3 mL Intravenous Q12H    BMET    Component Value Date/Time   NA 142 01/22/2014 0323   K 4.0 01/22/2014 0323   CL 101 01/22/2014 0323   CO2 29 01/22/2014 0323   GLUCOSE 192* 01/22/2014 0323   BUN 29* 01/22/2014 0323   CREATININE 2.88* 01/22/2014 0323   CALCIUM 8.2* 01/22/2014 0323   GFRNONAA 16* 01/22/2014 0323   GFRAA 18* 01/22/2014 0323   CBC    Component Value Date/Time   WBC 15.2* 01/22/2014 0323   RBC 2.78* 01/22/2014 0323   RBC 3.00* 01/15/2014 2335   HGB 7.9* 01/22/2014 0323   HCT 26.1* 01/22/2014 0323   PLT 213 01/22/2014 0323   MCV 93.9 01/22/2014 0323   MCH 28.4 01/22/2014 0323   MCHC  30.3 01/22/2014 0323   RDW 15.5 01/22/2014 0323   LYMPHSABS 1.5 01/15/2014 1730   MONOABS 0.5 01/15/2014 1730   EOSABS 0.3 01/15/2014 1730   BASOSABS 0.0 01/15/2014 1730     Assessment/Plan:  1. Progressive CKD due to DM, HTN, CHF who is now ESRD.  1. S/p 3 HD sessions with improvement in volume however her hypercapnea and AMS remains an issue.   2. Has not responded with increased diuretics (failed on lasix 160 tid and metolazone). 3. Outpt HD tentatively [facility] but will need to see if she will require SNF or CIR prior to d/c home and she will need AVF before discharge. 4. We discussed the chronicity and severity of her CKD and will need to pursue vein mapping and access placement as well as education about her treatment options  2. 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  1. Na restriction as well as fluid restriction (neither of which she had been doing at home) 2. Recommend dietician consult for weight loss as well as low Na diet 3. Nonadherent with low Na diet as well as fluid restriction 4. Question if she also has sleep apnea contributing to right-heart failure 5. Continue with HD and UF 3. HTN- better post diuresis 4. AMS due to CO2 retention- ABG showed PCO2 that rose from 44 to 54 to 84 s/p BiPap yesterday with marked improvement. Likely has component of OSA/OHS or Pickwickian syndrome and may benefit from CPAP or BiPap qhs.  1. PCCM following and will arrange nightly bipap and outpt evaluation 5. DM- per primary svc 6. Obesity- will need dietician evaluation/education. 7. Anemia of chronic disease- Hgb has been trending down since January of 2014 and was 9.9 in Sept 2014.  1. low iron stores will give feraheme 1020mg  2. start ESA once CHF is under better  control or once we start HD 8. SHPTH- has had an elevated iPTH of 149 in 1/14, will recheck but will likely require po vit D 9. Vascular access- s/p RIJ PC  1. For LUE AVF once more stable 2. Plan for HD with  UF to help improve overall medical condition before proceeding with AVF.  3. Will need access before she can go to outpt unit. 4.   Rocio Wolak A

## 2014-01-22 NOTE — Progress Notes (Signed)
[place] TEAM 1 - Stepdown/ICU TEAM Progress Note  Jacqueline [name] [id number] DOB: 04-18-1946 DOA: 01/15/2014 PCP: Salena Saner., MD  Admit HPI / Brief Narrative: 68 y.o. female with a history of HTN, DM2, Hyperlipidemia and CKD, who presented to the ED 2/14 with complaints of worsening SOB, and swelling of both of her legs over the past month. In the ED she was found to be in volume overload.  After she was admitted a diagnosis of AKI on CKD was made, and it became clear she would need HD for volume removal  The pt was taken to the OR on 2/18 for placement of a R IJ HD catheter.  After the OR procedue she had to be taken to the ICU because she became apneic during the vascular procedure, requiring full intubation.   SIGNIFICANT EVENTS / STUDIES:  2/18 R IJ tunneled HD cath  2/18 intubated briefly after surgery  HPI/Subjective: The patient is quite somnolent this morning, and confused.  She was napping when I awoke her for and exam.  She was not wearing the BIPAP.  She denies new complaints at this time.    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 Nutrition consult   Anemia of chronic kidney disease  Fe and epo per Nephrology  Code Status: FULL Family Communication: no family present at time of  exam Disposition Plan: SDU due to new start BIPAP  Consultants: TCTS Nephrology PCCM  Antibiotics: None  DVT prophylaxis: SQ heparin   Objective: Blood pressure 93/48, pulse 76, temperature 99.2 F (37.3 C), temperature source Oral, resp. rate 24, height 5\' 5"  (1.651 m), weight 139.8 kg (308 lb 3.3 oz), SpO2 100.00%.  Intake/Output Summary (Last 24 hours) at 01/22/14 1049 Last data filed at 01/22/14 0800  Gross per 24 hour  Intake    888 ml  Output   3840 ml  Net  -2952 ml   Exam: General: No acute respiratory distress but lethargic Lungs: Poor air movement/distant breath sounds throughout all fields with no appreciable wheezes Cardiovascular: Distant heart sounds - regular rate and rhythm - no appreciable murmur Abdomen: Morbidly obese, nontender, nondistended, soft, bowel sounds positive, no rebound, no ascites, no appreciable mass Extremities: No significant cyanosis, or clubbing;  1+ edema bilateral lower extremities  Data Reviewed: Basic Metabolic Panel:  Recent Labs Lab 01/19/14 0401 01/20/14 0226 01/21/14 0235 01/21/14 1300 01/22/14 0323  NA 140 142 141 139 142  K 4.8 4.0 3.8 3.8 4.0  CL 100 99 99 97 101  CO2 27 28 28 29 29   GLUCOSE 137* 75 141* 307* 192*  BUN 76* 61* 40* 46* 29*  CREATININE 4.39* 3.37* 3.04* 3.18* 2.88*  CALCIUM 7.9* 7.6* 8.1* 8.2* 8.2*  PHOS 6.2* 5.6* 4.5 4.9* 3.9   Liver Function Tests:  Recent Labs Lab 01/20/14 0226 01/20/14 0912 01/21/14 0235 01/21/14 1300 01/22/14 0323  AST  --  15  --   --   --   ALT  --  9  --   --   --   ALKPHOS  --  69  --   --   --   BILITOT  --  0.2*  --   --   --   PROT  --  6.3  --   --   --   ALBUMIN 2.7* 3.0* 2.7* 2.8* 2.8*   CBC:  Recent Labs Lab 01/15/14 1730  01/19/14 0401 01/20/14 0226 01/21/14 0235 01/21/14 1300 01/22/14 0323  WBC 10.4  < > 10.6* 13.6* 13.1* 15.4* 15.2*  NEUTROABS 8.0*  --   --   --   --   --   --   HGB 9.0*  < > 8.2* 7.9* 7.5* 7.8* 7.9*  HCT 28.9*  < > 26.8*  25.4* 24.3* 25.2* 26.1*  MCV 92.3  < > 93.7 92.7 92.4 92.3 93.9  PLT 294  < > 287 254 220 212 213  < > = values in this interval not displayed. CBG:  Recent Labs Lab 01/21/14 0824 01/21/14 1238 01/21/14 1753 01/21/14 2143 01/22/14 0752  GLUCAP 164* 333* 140* 276* 191*    Recent Results (from the past 240 hour(s))  MRSA PCR SCREENING     Status: None   Collection Time    01/19/14 11:38 AM      Result Value Ref Range Status   MRSA by PCR NEGATIVE  NEGATIVE Final   Comment:            The GeneXpert MRSA Assay (FDA     approved for NASAL specimens     only), is one component of a     comprehensive MRSA colonization     surveillance program. It is not     intended to diagnose MRSA     infection nor to guide or     monitor treatment for     MRSA infections.  CULTURE, BLOOD (ROUTINE X 2)     Status: None   Collection Time    01/20/14  8:47 AM      Result Value Ref Range Status   Specimen Description BLOOD RIGHT ARM   Final   Special Requests BOTTLES DRAWN AEROBIC AND ANAEROBIC 5MLS   Final   Culture  Setup Time     Final   Value: 01/20/2014 12:18     Performed at [facility]   Culture     Final   Value:        BLOOD CULTURE RECEIVED NO GROWTH TO DATE CULTURE WILL BE HELD FOR 5 DAYS BEFORE ISSUING A FINAL NEGATIVE REPORT     Performed at [facility]   Report Status PENDING   Incomplete  CULTURE, BLOOD (ROUTINE X 2)     Status: None   Collection Time    01/20/14  9:02 AM      Result Value Ref Range Status   Specimen Description BLOOD HEMODIALYSIS CATHETER   Final   Special Requests BOTTLES DRAWN AEROBIC AND ANAEROBIC 5CC   Final   Culture  Setup Time     Final   Value: 01/20/2014 13:57     Performed at [facility]   Culture     Final   Value:  BLOOD CULTURE RECEIVED NO GROWTH TO DATE CULTURE WILL BE HELD FOR 5 DAYS BEFORE ISSUING A FINAL NEGATIVE REPORT     Performed at [facility]   Report Status PENDING   Incomplete      Studies:  Recent x-ray studies have been reviewed in detail by the Attending Physician  Scheduled Meds:  Scheduled Meds: . B-complex with vitamin C  1 tablet Oral Daily  . cholecalciferol  2,000 Units Oral Daily  . furosemide  160 mg Intravenous 3 times per day  . heparin subcutaneous  5,000 Units Subcutaneous 3 times per day  . insulin aspart  0-15 Units Subcutaneous TID WC  . insulin aspart  0-5 Units Subcutaneous QHS  . iron polysaccharides  150 mg Oral Daily  . metolazone  2.5 mg Oral Daily  . omega-3 acid ethyl esters  1 g Oral Daily  . prazosin  10 mg Oral QHS  . sodium chloride  3 mL Intravenous Q12H    Time spent on care of this patient: 35 mins   [place]  [phone] Pager - Text Page per Shea Evans as per below:  On-Call/Text Page:      [email or web address]      password [email or web address]  If 7PM-7AM, please contact night-coverage [email or web address] Password [facility] 01/22/2014, 10:49 AM   LOS: 7 days

## 2014-01-23 LAB — RENAL FUNCTION PANEL
Albumin: 2.7 g/dL — ABNORMAL LOW (ref 3.5–5.2)
BUN: 46 mg/dL — ABNORMAL HIGH (ref 6–23)
CHLORIDE: 96 meq/L (ref 96–112)
CO2: 25 mEq/L (ref 19–32)
Calcium: 8.6 mg/dL (ref 8.4–10.5)
Creatinine, Ser: 3.78 mg/dL — ABNORMAL HIGH (ref 0.50–1.10)
GFR calc non Af Amer: 11 mL/min — ABNORMAL LOW (ref >90)
GFR, EST AFRICAN AMERICAN: 13 mL/min — AB (ref >90)
GLUCOSE: 179 mg/dL — AB (ref 70–99)
POTASSIUM: 4 meq/L (ref 3.7–5.3)
Phosphorus: 4 mg/dL (ref 2.3–4.6)
Sodium: 136 mEq/L — ABNORMAL LOW (ref 137–147)

## 2014-01-23 LAB — GLUCOSE, CAPILLARY
GLUCOSE-CAPILLARY: 141 mg/dL — AB (ref 70–99)
GLUCOSE-CAPILLARY: 143 mg/dL — AB (ref 70–99)
GLUCOSE-CAPILLARY: 169 mg/dL — AB (ref 70–99)
GLUCOSE-CAPILLARY: 157 mg/dL — AB (ref 70–99)
GLUCOSE-CAPILLARY: 227 mg/dL — AB (ref 70–99)

## 2014-01-23 MED ORDER — DARBEPOETIN ALFA-POLYSORBATE 100 MCG/0.5ML IJ SOLN
100.0000 ug | INTRAMUSCULAR | Status: DC
  Administered 2014-01-24: 100 ug via INTRAVENOUS
  Filled 2014-01-23: qty 0.5

## 2014-01-23 NOTE — Progress Notes (Signed)
Pt. Refuses BIPAP at this time. Pt. Stated that she would call to be placed on BIPAP if she got into distress. BIPAP is in pt.'s room at the bedside if needed.

## 2014-01-23 NOTE — Progress Notes (Signed)
[place] TEAM 1 - Stepdown/ICU TEAM Progress Note  Jacqueline [name] [id number] DOB: 1946/03/12 DOA: 01/15/2014 PCP: Salena Saner., MD  Admit HPI / Brief Narrative: 68 y.o. female with a history of HTN, DM2, Hyperlipidemia and CKD, who presented to the ED 2/14 with complaints of worsening SOB, and swelling of both of her legs over the past month. In the ED she was found to be in volume overload.  After she was admitted a diagnosis of AKI on CKD was made, and it became clear she would need HD for volume removal  The pt was taken to the OR on 2/18 for placement of a R IJ HD catheter.  After the OR procedue she had to be taken to the ICU because she became apneic during the vascular procedure, requiring full intubation.   SIGNIFICANT EVENTS / STUDIES:  2/18 R IJ tunneled HD cath  2/18 intubated briefly after surgery  HPI/Subjective: Somnolent this morning.  Does not appear to be uncomfortable.   Has been refusing BIPAP.    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, HTN, CHF > now ESRD Per Nephrology - HD has been initiated - failed trial of diuretic - per Neph will need permanent HD access prior to d/c - clearly is high risk for same and agree doing while pt inpatient is safest option as pt will require intubation and likely prolonged vent support in peri-operative period  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 Nutrition consult   Anemia of chronic kidney disease  Fe and epo per Nephrology  Code Status: FULL Family Communication: no family present at time of exam Disposition Plan: SDU due to new start BIPAP  Consultants: TCTS Nephrology PCCM  Antibiotics: None  DVT prophylaxis: SQ heparin   Objective: Blood pressure 109/33, pulse 66, temperature 99 F (37.2 C), temperature source Oral, resp. rate 23, height 5\' 5"  (1.651 m), weight 139.1 kg (306 lb 10.6 oz), SpO2 100.00%.  Intake/Output Summary (Last 24 hours) at 01/23/14 [id number] Last data filed at 01/23/14 0700  Gross per 24 hour  Intake   1168 ml  Output    500 ml  Net    668 ml   Exam: General: No acute respiratory distress but somnolent  Lungs: Poor air movement/distant breath sounds throughout all fields with no appreciable wheezes Cardiovascular: Distant heart sounds - regular rate and rhythm - no appreciable murmur Abdomen: Morbidly obese, nontender, nondistended, soft, bowel sounds positive, no rebound, no ascites, no appreciable mass Extremities: No significant cyanosis, or clubbing;  1+ edema bilateral lower extremities  Data Reviewed: Basic Metabolic Panel:  Recent Labs Lab 01/20/14 0226 01/21/14 0235 01/21/14 1300 01/22/14 0323 01/23/14 0310  NA 142 141 139 142 136*  K 4.0 3.8 3.8 4.0 4.0  CL 99 99 97 101 96  CO2 28 28 29 29 25   GLUCOSE 75 141* 307* 192* 179*  BUN 61* 40* 46* 29* 46*  CREATININE 3.37* 3.04* 3.18* 2.88* 3.78*  CALCIUM 7.6* 8.1* 8.2* 8.2* 8.6  PHOS 5.6* 4.5 4.9* 3.9 4.0   Liver Function Tests:  Recent Labs Lab 01/20/14 0912 01/21/14 0235 01/21/14 1300 01/22/14 0323 01/23/14 0310  AST 15  --   --   --   --   ALT 9  --   --   --   --   ALKPHOS 69  --   --   --   --   BILITOT 0.2*  --   --   --   --   PROT 6.3  --   --   --   --   ALBUMIN 3.0* 2.7* 2.8* 2.8* 2.7*   CBC:  Recent Labs Lab 01/19/14 0401  01/20/14 0226 01/21/14 0235 01/21/14 1300 01/22/14 0323  WBC 10.6* 13.6* 13.1* 15.4* 15.2*  HGB 8.2* 7.9* 7.5* 7.8* 7.9*  HCT 26.8* 25.4* 24.3* 25.2* 26.1*  MCV 93.7 92.7 92.4 92.3 93.9  PLT 287 254 220 212 213   CBG:  Recent Labs Lab 01/22/14 1419 01/22/14 1720 01/22/14 2128 01/22/14 2304 01/23/14 0836  GLUCAP 233* 267* 157* 141* 143*    Recent Results (from the past 240 hour(s))  MRSA PCR SCREENING     Status: None   Collection Time    01/19/14 11:38 AM      Result Value Ref Range Status   MRSA by PCR NEGATIVE  NEGATIVE Final   Comment:            The GeneXpert MRSA Assay (FDA     approved for NASAL specimens     only), is one component of a     comprehensive MRSA colonization     surveillance program. It is not     intended to diagnose MRSA     infection nor to guide or     monitor treatment for     MRSA infections.  CULTURE, BLOOD (ROUTINE X 2)     Status: None   Collection Time    01/20/14  8:47 AM      Result Value Ref Range Status   Specimen Description BLOOD RIGHT ARM   Final   Special Requests BOTTLES DRAWN AEROBIC AND ANAEROBIC 5MLS   Final   Culture  Setup Time     Final   Value: 01/20/2014 12:18     Performed at [facility]   Culture     Final   Value:        BLOOD CULTURE RECEIVED NO GROWTH TO DATE CULTURE WILL BE HELD FOR 5 DAYS BEFORE ISSUING A FINAL NEGATIVE REPORT     Performed at [facility]   Report Status PENDING   Incomplete  CULTURE, BLOOD (ROUTINE X 2)     Status: None   Collection Time    01/20/14  9:02 AM      Result Value Ref Range Status   Specimen Description BLOOD HEMODIALYSIS CATHETER   Final   Special Requests BOTTLES DRAWN AEROBIC AND ANAEROBIC 5CC   Final   Culture  Setup Time     Final   Value: 01/20/2014 13:57     Performed at [facility]   Culture     Final   Value:        BLOOD CULTURE RECEIVED NO GROWTH TO DATE  CULTURE WILL BE HELD FOR 5 DAYS BEFORE ISSUING A FINAL NEGATIVE REPORT      Performed at [facility]   Report Status PENDING   Incomplete     Studies:  Recent x-ray studies have been reviewed in detail by the Attending Physician  Scheduled Meds:  Scheduled Meds: . B-complex with vitamin C  1 tablet Oral Daily  . cholecalciferol  2,000 Units Oral Daily  . furosemide  160 mg Intravenous 3 times per day  . heparin subcutaneous  5,000 Units Subcutaneous 3 times per day  . insulin aspart  0-15 Units Subcutaneous TID WC  . insulin aspart  0-5 Units Subcutaneous QHS  . insulin glargine  20 Units Subcutaneous QHS  . iron polysaccharides  150 mg Oral Daily  . metolazone  2.5 mg Oral Daily  . omega-3 acid ethyl esters  1 g Oral Daily  . prazosin  10 mg Oral QHS  . sodium chloride  3 mL Intravenous Q12H    Time spent on care of this patient: 25 mins   [place]  [phone] Pager - Text Page per Shea Evans as per below:  On-Call/Text Page:      [email or web address]      password [email or web address]  If 7PM-7AM, please contact night-coverage [email or web address] Password [facility] 01/23/2014, 9:38 AM   LOS: 8 days

## 2014-01-23 NOTE — Progress Notes (Signed)
Patient ID: Jacqueline [name], female   DOB: [birth year], 68 y.o.   MRN: [id number] S:confused this morning (didnt wear BiPap last night) O:BP 109/33  Pulse 66  Temp(Src) 99 F (37.2 C) (Oral)  Resp 23  Ht 5\' 5"  (1.651 m)  Wt 139.1 kg (306 lb 10.6 oz)  BMI 51.03 kg/m2  SpO2 100%  Intake/Output Summary (Last 24 hours) at 01/23/14 1008 Last data filed at 01/23/14 0700  Gross per 24 hour  Intake   1048 ml  Output    500 ml  Net    548 ml   Intake/Output: I/O last 3 completed shifts: In: [id number] [P.O.:950; I.V.:370; IV Piggyback:330] Out: 600 [Urine:600]  Intake/Output this shift:    Weight change: -4.7 kg (-10 lb 5.8 oz) [id number] obese AAF in NAD HS:[id number] HS Resp:decreased BS [id number] +BS, soft Ext:+tr-1+ edema   Recent Labs Lab 01/18/14 0430 01/18/14 1620 01/19/14 0401 01/20/14 0226 01/20/14 0912 01/21/14 0235 01/21/14 1300 01/22/14 0323 01/23/14 0310  NA 141 142 140 142  --  141 139 142 136*  K 5.7* 5.0 4.8 4.0  --  3.8 3.8 4.0 4.0  CL 100 101 100 99  --  99 97 101 96  CO2 26 28 27 28   --  28 29 29 25   GLUCOSE 227* 118* 137* 75  --  141* 307* 192* 179*  BUN 67* 73* 76* 61*  --  40* 46* 29* 46*  CREATININE 4.31* 4.41* 4.39* 3.37*  --  3.04* 3.18* 2.88* 3.78*  ALBUMIN 3.1*  --  3.0* 2.7* 3.0* 2.7* 2.8* 2.8* 2.7*  CALCIUM 7.9* 7.7* 7.9* 7.6*  --  8.1* 8.2* 8.2* 8.6  PHOS 5.9*  --  6.2* 5.6*  --  4.5 4.9* 3.9 4.0  AST  --   --   --   --  15  --   --   --   --   ALT  --   --   --   --  9  --   --   --   --    Liver Function Tests:  Recent Labs Lab 01/20/14 0912  01/21/14 1300 01/22/14 0323 01/23/14 0310  AST 15  --   --   --   --   ALT 9  --   --   --   --   ALKPHOS 69  --   --   --   --   BILITOT 0.2*  --   --   --   --   PROT 6.3  --   --   --   --   ALBUMIN 3.0*  < > 2.8* 2.8* 2.7*  < > = values in this interval not displayed. No results found for this basename: LIPASE, AMYLASE,  in the last 168 hours No results found for this basename: AMMONIA,   in the last 168 hours CBC:  Recent Labs Lab 01/19/14 0401 01/20/14 0226 01/21/14 0235 01/21/14 1300 01/22/14 0323  WBC 10.6* 13.6* 13.1* 15.4* 15.2*  HGB 8.2* 7.9* 7.5* 7.8* 7.9*  HCT 26.8* 25.4* 24.3* 25.2* 26.1*  MCV 93.7 92.7 92.4 92.3 93.9  PLT 287 254 220 212 213   Cardiac Enzymes: No results found for this basename: CKTOTAL, CKMB, CKMBINDEX, TROPONINI,  in the last 168 hours CBG:  Recent Labs Lab 01/22/14 1419 01/22/14 1720 01/22/14 2128 01/22/14 2304 01/23/14 0836  GLUCAP 233* 267* 157* 141* 143*    Iron Studies: No results found for this basename: IRON, TIBC,  TRANSFERRIN, FERRITIN,  in the last 72 hours Studies/Results: No results found. . B-complex with vitamin C  1 tablet Oral Daily  . cholecalciferol  2,000 Units Oral Daily  . heparin subcutaneous  5,000 Units Subcutaneous 3 times per day  . insulin aspart  0-15 Units Subcutaneous TID WC  . insulin aspart  0-5 Units Subcutaneous QHS  . insulin glargine  20 Units Subcutaneous QHS  . iron polysaccharides  150 mg Oral Daily  . metolazone  2.5 mg Oral Daily  . omega-3 acid ethyl esters  1 g Oral Daily  . prazosin  10 mg Oral QHS  . sodium chloride  3 mL Intravenous Q12H    BMET    Component Value Date/Time   NA 136* 01/23/2014 0310   K 4.0 01/23/2014 0310   CL 96 01/23/2014 0310   CO2 25 01/23/2014 0310   GLUCOSE 179* 01/23/2014 0310   BUN 46* 01/23/2014 0310   CREATININE 3.78* 01/23/2014 0310   CALCIUM 8.6 01/23/2014 0310   GFRNONAA 11* 01/23/2014 0310   GFRAA 13* 01/23/2014 0310   CBC    Component Value Date/Time   WBC 15.2* 01/22/2014 0323   RBC 2.78* 01/22/2014 0323   RBC 3.00* 01/15/2014 2335   HGB 7.9* 01/22/2014 0323   HCT 26.1* 01/22/2014 0323   PLT 213 01/22/2014 0323   MCV 93.9 01/22/2014 0323   MCH 28.4 01/22/2014 0323   MCHC 30.3 01/22/2014 0323   RDW 15.5 01/22/2014 0323   LYMPHSABS 1.5 01/15/2014 1730   MONOABS 0.5 01/15/2014 1730   EOSABS 0.3 01/15/2014 1730   BASOSABS 0.0 01/15/2014 1730      Assessment/Plan:  1. Progressive CKD due to DM, HTN, CHF who is now ESRD.  1. S/p 3 HD sessions with improvement in volume however her hypercapnea and AMS remains an issue.  2. Has not responded with increased diuretics (failed on lasix 160 tid and metolazone). 3. We discussed the chronicity and severity of her CKD and will need to pursue vein mapping and access placement as well as education about her treatment options 4. Outpt HD tentatively [facility] TTS but will need to see if she will require SNF or CIR prior to d/c home and she will need AVF before discharge. 5. Plan for HD tomorrow for UF 2. 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  1. Na restriction as well as fluid restriction (neither of which she had been doing at home) 2. Recommend dietician consult for weight loss as well as low Na diet 3. Nonadherent with low Na diet as well as fluid restriction 4. Question if she also has sleep apnea and will need formal outpt eval per PCCM 5. Continue with HD and UF 3. HTN- better post diuresis 4. AMS due to CO2 retention- ABG showed PCO2 that rose from 44 to 54 to 84 s/p BiPap yesterday with marked improvement. Likely has component of OSA/OHS or Pickwickian syndrome and may benefit from CPAP or BiPap qhs.  1. PCCM following and will arrange nightly bipap and outpt evaluation 2. Refused BiPap last pm and was confused this am.   3. Stressed the need for compliance with BiPap 5. DM- per primary svc 6. Obesity- will need dietician evaluation/education. 7. Anemia of chronic disease- Hgb has been trending down since January of 2014 and was 9.9 in Sept 2014.  1. low iron stores will give feraheme 1020mg  2. Started on ESA with HD 8. SHPTH- has had an elevated iPTH of 149 in  1/14, will recheck but will likely require po vit D 9. Vascular access- s/p RIJ PC  1. For LUE AVF once more stable 2. Plan for HD with UF to help improve overall medical condition before proceeding with AVF.   3. Will need access before she can go to outpt unit as she has not followed up as an outpt and pulmonary status will need to be closely monitored post operatively. 4.   Londell Noll A

## 2014-01-24 LAB — GLUCOSE, CAPILLARY
Glucose-Capillary: 126 mg/dL — ABNORMAL HIGH (ref 70–99)
Glucose-Capillary: 109 mg/dL — ABNORMAL HIGH (ref 70–99)
Glucose-Capillary: 211 mg/dL — ABNORMAL HIGH (ref 70–99)

## 2014-01-24 LAB — RENAL FUNCTION PANEL
ALBUMIN: 2.7 g/dL — AB (ref 3.5–5.2)
BUN: 61 mg/dL — AB (ref 6–23)
CO2: 27 mEq/L (ref 19–32)
Calcium: 8.5 mg/dL (ref 8.4–10.5)
Chloride: 98 mEq/L (ref 96–112)
Creatinine, Ser: 3.89 mg/dL — ABNORMAL HIGH (ref 0.50–1.10)
GFR calc Af Amer: 13 mL/min — ABNORMAL LOW (ref >90)
GFR calc non Af Amer: 11 mL/min — ABNORMAL LOW (ref >90)
Glucose, Bld: 158 mg/dL — ABNORMAL HIGH (ref 70–99)
PHOSPHORUS: 4.6 mg/dL (ref 2.3–4.6)
POTASSIUM: 4.1 meq/L (ref 3.7–5.3)
Sodium: 138 mEq/L (ref 137–147)

## 2014-01-24 MED ORDER — HEPARIN SODIUM (PORCINE) 1000 UNIT/ML DIALYSIS: 20.0000 [IU]/kg | INTRAMUSCULAR | Status: DC | PRN

## 2014-01-24 MED ORDER — INSULIN GLARGINE 100 UNIT/ML ~~LOC~~ SOLN
26.0000 [IU] | Freq: Every day | SUBCUTANEOUS | Status: DC
  Administered 2014-01-24: 26 [IU] via SUBCUTANEOUS
  Filled 2014-01-24 (×2): qty 0.26

## 2014-01-24 MED ORDER — DARBEPOETIN ALFA-POLYSORBATE 100 MCG/0.5ML IJ SOLN
INTRAMUSCULAR | Status: AC
  Administered 2014-01-24: 100 ug via INTRAVENOUS
  Filled 2014-01-24: qty 0.5

## 2014-01-24 NOTE — Progress Notes (Signed)
01/24/2014 11:52 AM To Dialysis. Dezeray Puccio, [name]

## 2014-01-24 NOTE — Progress Notes (Signed)
[facility] TEAM 1 - Stepdown/ICU TEAM Progress Note  Jacqueline [name] [id number] DOB: 1946/01/15 DOA: 01/15/2014 PCP: Salena Saner., MD  Admit HPI / Brief Narrative: 68 y.o. female with a history of HTN, DM2, Hyperlipidemia and CKD, who presented to the ED 2/14 with complaints of worsening SOB, and swelling of both of her legs over the past month. In the ED she was found to be in volume overload.  After she was admitted a diagnosis of AKI on CKD was made, and it became clear she would need HD for volume removal  The pt was taken to the OR on 2/18 for placement of a R IJ HD catheter.  After the OR procedue she had to be taken to the ICU because she became apneic during the vascular procedure, requiring full intubation.   SIGNIFICANT EVENTS / STUDIES:  2/18 R IJ tunneled HD cath  2/18 intubated briefly after surgery  HPI/Subjective: Agreed to use her BIPAP last night.  Much more alert today.  Conversant.  Oriented.  Ready to proceed w/ permanent HD access.  Denies sob, n/v, cp, or f/c.   Assessment/Plan:  Acute on chronic respiratory failure  Multifactorial with primary issues being OHS/OSA as well as volume overload in the setting of end-stage renal disease - see below   Suspected OSA / OHS  Auto-set BiPAP QHS - will need f/u with a sleep MD as outpatient for a formal sleep study - Pulm to arrange - prognosis is quite poor unless pt decides to comply w/ BIPAP use - cont to educate - pt used BIPAP last night and difference in mental status this morning is quite remarkable   Progressive CKD due to DM, HTN, CHF > now ESRD Per Nephrology - HD has been initiated - failed trial of diuretic - per Neph will need permanent HD access prior to d/c - clearly is high risk for same and agree doing while inpatient is safest option as pt will require intubation and likely prolonged vent support in peri-operative period  Acute on chronic CHF, diastolic w/ Mild pulmonary edema  Volume now being  managed by HD - counseling on diet - fluid and Na restrict   HTN Control improved w/ HD - follow w/o change today   DM  CBG improving but not yet at goal - gently adjust tx and follow   Morbid obesity - Body mass index is 51.1 kg/(m^2). Nutrition consult   Anemia of chronic kidney disease  Fe and epo per Nephrology - recheck CBC in AM   Code Status: FULL Family Communication: no family present at time of exam Disposition Plan: SDU (new start BIPAP)  Consultants: TCTS Nephrology PCCM  Antibiotics: None  DVT prophylaxis: SQ heparin   Objective: Blood pressure 155/61, pulse 61, temperature 97.8 F (36.6 C), temperature source Oral, resp. rate 18, height 5\' 5"  (1.651 m), weight 139.3 kg (307 lb 1.6 oz), SpO2 100.00%.  Intake/Output Summary (Last 24 hours) at 01/24/14 1508 Last data filed at 01/24/14 1100  Gross per 24 hour  Intake    553 ml  Output    375 ml  Net    178 ml   Exam: General: No acute respiratory distress - alert and conversant  Lungs: Poor air movement/distant breath sounds throughout all fields with no appreciable wheezes Cardiovascular: Distant heart sounds - regular rate and rhythm - no appreciable murmur Abdomen: Morbidly obese, nontender, nondistended, soft, bowel sounds positive, no rebound, no ascites, no appreciable mass Extremities: No significant cyanosis,  or clubbing;  1+ edema bilateral lower extremities  Data Reviewed: Basic Metabolic Panel:  Recent Labs Lab 01/21/14 0235 01/21/14 1300 01/22/14 0323 01/23/14 0310 01/24/14 0339  NA 141 139 142 136* 138  K 3.8 3.8 4.0 4.0 4.1  CL 99 97 101 96 98  CO2 28 29 29 25 27   GLUCOSE 141* 307* 192* 179* 158*  BUN 40* 46* 29* 46* 61*  CREATININE 3.04* 3.18* 2.88* 3.78* 3.89*  CALCIUM 8.1* 8.2* 8.2* 8.6 8.5  PHOS 4.5 4.9* 3.9 4.0 4.6   Liver Function Tests:  Recent Labs Lab 01/20/14 0912 01/21/14 0235 01/21/14 1300 01/22/14 0323 01/23/14 0310 01/24/14 0339  AST 15  --   --   --    --   --   ALT 9  --   --   --   --   --   ALKPHOS 69  --   --   --   --   --   BILITOT 0.2*  --   --   --   --   --   PROT 6.3  --   --   --   --   --   ALBUMIN 3.0* 2.7* 2.8* 2.8* 2.7* 2.7*   CBC:  Recent Labs Lab 01/19/14 0401 01/20/14 0226 01/21/14 0235 01/21/14 1300 01/22/14 0323  WBC 10.6* 13.6* 13.1* 15.4* 15.2*  HGB 8.2* 7.9* 7.5* 7.8* 7.9*  HCT 26.8* 25.4* 24.3* 25.2* 26.1*  MCV 93.7 92.7 92.4 92.3 93.9  PLT 287 254 220 212 213   CBG:  Recent Labs Lab 01/23/14 0836 01/23/14 1224 01/23/14 1702 01/23/14 2117 01/24/14 0817  GLUCAP 143* 169* 157* 227* 126*    Recent Results (from the past 240 hour(s))  MRSA PCR SCREENING     Status: None   Collection Time    01/19/14 11:38 AM      Result Value Ref Range Status   MRSA by PCR NEGATIVE  NEGATIVE Final   Comment:            The GeneXpert MRSA Assay (FDA     approved for NASAL specimens     only), is one component of a     comprehensive MRSA colonization     surveillance program. It is not     intended to diagnose MRSA     infection nor to guide or     monitor treatment for     MRSA infections.  CULTURE, BLOOD (ROUTINE X 2)     Status: None   Collection Time    01/20/14  8:47 AM      Result Value Ref Range Status   Specimen Description BLOOD RIGHT ARM   Final   Special Requests BOTTLES DRAWN AEROBIC AND ANAEROBIC 5MLS   Final   Culture  Setup Time     Final   Value: 01/20/2014 12:18     Performed at [facility]   Culture     Final   Value:        BLOOD CULTURE RECEIVED NO GROWTH TO DATE CULTURE WILL BE HELD FOR 5 DAYS BEFORE ISSUING A FINAL NEGATIVE REPORT     Performed at [facility]   Report Status PENDING   Incomplete  CULTURE, BLOOD (ROUTINE X 2)     Status: None   Collection Time    01/20/14  9:02 AM      Result Value Ref Range Status   Specimen Description BLOOD HEMODIALYSIS CATHETER   Final  Special Requests BOTTLES DRAWN AEROBIC AND ANAEROBIC 5CC   Final   Culture  Setup  Time     Final   Value: 01/20/2014 13:57     Performed at [facility]   Culture     Final   Value:        BLOOD CULTURE RECEIVED NO GROWTH TO DATE CULTURE WILL BE HELD FOR 5 DAYS BEFORE ISSUING A FINAL NEGATIVE REPORT     Performed at [facility]   Report Status PENDING   Incomplete     Studies:  Recent x-ray studies have been reviewed in detail by the Attending Physician  Scheduled Meds:  Scheduled Meds: . B-complex with vitamin C  1 tablet Oral Daily  . cholecalciferol  2,000 Units Oral Daily  . darbepoetin (ARANESP) injection - DIALYSIS  100 mcg Intravenous Q Mon-HD  . heparin subcutaneous  5,000 Units Subcutaneous 3 times per day  . insulin aspart  0-15 Units Subcutaneous TID WC  . insulin aspart  0-5 Units Subcutaneous QHS  . insulin glargine  20 Units Subcutaneous QHS  . iron polysaccharides  150 mg Oral Daily  . metolazone  2.5 mg Oral Daily  . omega-3 acid ethyl esters  1 g Oral Daily  . prazosin  10 mg Oral QHS  . sodium chloride  3 mL Intravenous Q12H    Time spent on care of this patient: 25 mins   [place]  [phone] Pager - Text Page per Shea Evans as per below:  On-Call/Text Page:      [email or web address]      password [email or web address]  If 7PM-7AM, please contact night-coverage [email or web address] Password [email or web address] 01/24/2014, 3:08 PM   LOS: 9 days

## 2014-01-24 NOTE — Progress Notes (Signed)
01/24/2014 1410  Return for dialysis. Jacqueline [name], [name]

## 2014-01-24 NOTE — Progress Notes (Signed)
Pt. Has now decided to wear BIPAP. Pt. Was placed on BIPAP 8/4, 30%. Pt. Is tolerating BIPAP well at this time but pt. States that she doesn't know how long she will be able to tolerated BIPAP.

## 2014-01-24 NOTE — Progress Notes (Signed)
[facility] ROUNDING NOTE   Subjective:   Interval History: less confused this morning . Expressed a desire to return home    Objective:  Vital signs in last 24 hours:  Temp:  [98.4 F (36.9 C)-100.3 F (37.9 C)] 98.4 F (36.9 C) (02/23 0800) Pulse Rate:  [62-78] 78 (02/23 1000) Resp:  [18-27] 20 (02/23 1000) BP: (125-151)/(35-71) 151/54 mmHg (02/23 1000) SpO2:  [100 %] 100 % (02/23 0800) FiO2 (%):  [30 %] 30 % (02/23 0400) Weight:  [139 kg (306 lb 7 oz)] 139 kg (306 lb 7 oz) (02/23 0700)  Weight change: -0.1 kg (-3.5 oz) Filed Weights   01/21/14 1724 01/23/14 0400 01/24/14 0700  Weight: 139.8 kg (308 lb 3.3 oz) 139.1 kg (306 lb 10.6 oz) 139 kg (306 lb 7 oz)    Intake/Output: I/O last 3 completed shifts: In: [id number] [P.O.:1185; I.V.:193; IV Piggyback:132] Out: 700 [Urine:700]   Intake/Output this shift:  Total I/O In: -  Out: 100 [Urine:100]  CVS- RRR RS- CTA  Diminished at base ABD- BS present soft non-distended EXT- trace edema   Basic Metabolic Panel:  Recent Labs Lab 01/21/14 0235 01/21/14 1300 01/22/14 0323 01/23/14 0310 01/24/14 0339  NA 141 139 142 136* 138  K 3.8 3.8 4.0 4.0 4.1  CL 99 97 101 96 98  CO2 28 29 29 25 27   GLUCOSE 141* 307* 192* 179* 158*  BUN 40* 46* 29* 46* 61*  CREATININE 3.04* 3.18* 2.88* 3.78* 3.89*  CALCIUM 8.1* 8.2* 8.2* 8.6 8.5  PHOS 4.5 4.9* 3.9 4.0 4.6    Liver Function Tests:  Recent Labs Lab 01/20/14 0912 01/21/14 0235 01/21/14 1300 01/22/14 0323 01/23/14 0310 01/24/14 0339  AST 15  --   --   --   --   --   ALT 9  --   --   --   --   --   ALKPHOS 69  --   --   --   --   --   BILITOT 0.2*  --   --   --   --   --   PROT 6.3  --   --   --   --   --   ALBUMIN 3.0* 2.7* 2.8* 2.8* 2.7* 2.7*   No results found for this basename: LIPASE, AMYLASE,  in the last 168 hours No results found for this basename: AMMONIA,  in the last 168 hours  CBC:  Recent Labs Lab 01/19/14 0401 01/20/14 0226  01/21/14 0235 01/21/14 1300 01/22/14 0323  WBC 10.6* 13.6* 13.1* 15.4* 15.2*  HGB 8.2* 7.9* 7.5* 7.8* 7.9*  HCT 26.8* 25.4* 24.3* 25.2* 26.1*  MCV 93.7 92.7 92.4 92.3 93.9  PLT 287 254 220 212 213    Cardiac Enzymes: No results found for this basename: CKTOTAL, CKMB, CKMBINDEX, TROPONINI,  in the last 168 hours  BNP: No components found with this basename: POCBNP,   CBG:  Recent Labs Lab 01/23/14 0836 01/23/14 1224 01/23/14 1702 01/23/14 2117 01/24/14 0817  GLUCAP 143* 169* 157* 227* 126*    Microbiology: Results for orders placed during the hospital encounter of 01/15/14  MRSA PCR SCREENING     Status: None   Collection Time    01/19/14 11:38 AM      Result Value Ref Range Status   MRSA by PCR NEGATIVE  NEGATIVE Final   Comment:            The GeneXpert MRSA Assay (FDA  approved for NASAL specimens     only), is one component of a     comprehensive MRSA colonization     surveillance program. It is not     intended to diagnose MRSA     infection nor to guide or     monitor treatment for     MRSA infections.  CULTURE, BLOOD (ROUTINE X 2)     Status: None   Collection Time    01/20/14  8:47 AM      Result Value Ref Range Status   Specimen Description BLOOD RIGHT ARM   Final   Special Requests BOTTLES DRAWN AEROBIC AND ANAEROBIC 5MLS   Final   Culture  Setup Time     Final   Value: 01/20/2014 12:18     Performed at [facility]   Culture     Final   Value:        BLOOD CULTURE RECEIVED NO GROWTH TO DATE CULTURE WILL BE HELD FOR 5 DAYS BEFORE ISSUING A FINAL NEGATIVE REPORT     Performed at [facility]   Report Status PENDING   Incomplete  CULTURE, BLOOD (ROUTINE X 2)     Status: None   Collection Time    01/20/14  9:02 AM      Result Value Ref Range Status   Specimen Description BLOOD HEMODIALYSIS CATHETER   Final   Special Requests BOTTLES DRAWN AEROBIC AND ANAEROBIC 5CC   Final   Culture  Setup Time     Final   Value: 01/20/2014  13:57     Performed at [facility]   Culture     Final   Value:        BLOOD CULTURE RECEIVED NO GROWTH TO DATE CULTURE WILL BE HELD FOR 5 DAYS BEFORE ISSUING A FINAL NEGATIVE REPORT     Performed at [facility]   Report Status PENDING   Incomplete    Coagulation Studies: No results found for this basename: LABPROT, INR,  in the last 72 hours  Urinalysis: No results found for this basename: COLORURINE, APPERANCEUR, LABSPEC, PHURINE, GLUCOSEU, HGBUR, BILIRUBINUR, KETONESUR, PROTEINUR, UROBILINOGEN, NITRITE, LEUKOCYTESUR,  in the last 72 hours    Imaging: No results found.   Medications:     . B-complex with vitamin C  1 tablet Oral Daily  . cholecalciferol  2,000 Units Oral Daily  . darbepoetin (ARANESP) injection - DIALYSIS  100 mcg Intravenous Q Mon-HD  . heparin subcutaneous  5,000 Units Subcutaneous 3 times per day  . insulin aspart  0-15 Units Subcutaneous TID WC  . insulin aspart  0-5 Units Subcutaneous QHS  . insulin glargine  20 Units Subcutaneous QHS  . iron polysaccharides  150 mg Oral Daily  . metolazone  2.5 mg Oral Daily  . omega-3 acid ethyl esters  1 g Oral Daily  . prazosin  10 mg Oral QHS  . sodium chloride  3 mL Intravenous Q12H   sodium chloride, sodium chloride, sodium chloride, acetaminophen, albuterol, feeding supplement (NEPRO CARB STEADY), heparin, heparin, heparin, labetalol, lidocaine (PF), lidocaine-prilocaine, ondansetron (ZOFRAN) IV, pentafluoroprop-tetrafluoroeth, sodium chloride  Assessment/ Plan:  68 y.o. female with a history of HTN, DM2, Hyperlipidemia and CKD, who presented to the ED 2/14 with complaints of worsening SOB, and swelling of both of her legs over the past month. In the ED she was found to be in volume overload. After she was admitted a diagnosis of AKI on CKD was made, and it became  clear she would need HD for volume removal The pt was taken to the OR on 2/18 for placement of a R IJ HD catheter. 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

## 2014-01-24 NOTE — Progress Notes (Signed)
Pt. Refuses to wear BIPAP at this time.

## 2014-01-25 ENCOUNTER — Inpatient Hospital Stay (HOSPITAL_COMMUNITY): Payer: Medicare Other

## 2014-01-25 LAB — RENAL FUNCTION PANEL
Albumin: 2.7 g/dL — ABNORMAL LOW (ref 3.5–5.2)
BUN: 29 mg/dL — AB (ref 6–23)
CO2: 28 mEq/L (ref 19–32)
CREATININE: 2.67 mg/dL — AB (ref 0.50–1.10)
Calcium: 8.4 mg/dL (ref 8.4–10.5)
Chloride: 97 mEq/L (ref 96–112)
GFR calc Af Amer: 20 mL/min — ABNORMAL LOW (ref >90)
GFR calc non Af Amer: 17 mL/min — ABNORMAL LOW (ref >90)
GLUCOSE: 167 mg/dL — AB (ref 70–99)
PHOSPHORUS: 3.5 mg/dL (ref 2.3–4.6)
POTASSIUM: 3.8 meq/L (ref 3.7–5.3)
Sodium: 139 mEq/L (ref 137–147)

## 2014-01-25 LAB — URINALYSIS, ROUTINE W REFLEX MICROSCOPIC
GLUCOSE, UA: NEGATIVE mg/dL
KETONES UR: NEGATIVE mg/dL
Nitrite: NEGATIVE
PH: 5 (ref 5.0–8.0)
Protein, ur: 100 mg/dL — AB
Specific Gravity, Urine: 1.02 (ref 1.005–1.030)
Urobilinogen, UA: 1 mg/dL (ref 0.0–1.0)

## 2014-01-25 LAB — CBC
HEMATOCRIT: 26.4 % — AB (ref 36.0–46.0)
Hemoglobin: 8.4 g/dL — ABNORMAL LOW (ref 12.0–15.0)
MCH: 29.5 pg (ref 26.0–34.0)
MCHC: 31.8 g/dL (ref 30.0–36.0)
MCV: 92.6 fL (ref 78.0–100.0)
Platelets: 227 10*3/uL (ref 150–400)
RBC: 2.85 MIL/uL — ABNORMAL LOW (ref 3.87–5.11)
RDW: 16.1 % — AB (ref 11.5–15.5)
WBC: 16.6 10*3/uL — AB (ref 4.0–10.5)

## 2014-01-25 LAB — GLUCOSE, CAPILLARY
GLUCOSE-CAPILLARY: 216 mg/dL — AB (ref 70–99)
GLUCOSE-CAPILLARY: 207 mg/dL — AB (ref 70–99)
GLUCOSE-CAPILLARY: 160 mg/dL — AB (ref 70–99)
Glucose-Capillary: 153 mg/dL — ABNORMAL HIGH (ref 70–99)

## 2014-01-25 LAB — URINE MICROSCOPIC-ADD ON

## 2014-01-25 MED ORDER — DEXTROSE 5 % IV SOLN
2.0000 g | Freq: Once | INTRAVENOUS | Status: AC
  Administered 2014-01-25: 2 g via INTRAVENOUS
  Filled 2014-01-25: qty 2

## 2014-01-25 MED ORDER — INSULIN GLARGINE 100 UNIT/ML ~~LOC~~ SOLN
28.0000 [IU] | Freq: Every day | SUBCUTANEOUS | Status: DC
  Administered 2014-01-25 – 2014-01-27 (×3): 28 [IU] via SUBCUTANEOUS
  Filled 2014-01-25 (×4): qty 0.28

## 2014-01-25 MED ORDER — LABETALOL HCL 100 MG PO TABS
100.0000 mg | ORAL_TABLET | Freq: Two times a day (BID) | ORAL | Status: DC
  Administered 2014-01-25 – 2014-01-27 (×4): 100 mg via ORAL
  Filled 2014-01-25 (×6): qty 1

## 2014-01-25 MED ORDER — VANCOMYCIN HCL 10 G IV SOLR
2500.0000 mg | Freq: Once | INTRAVENOUS | Status: AC
  Administered 2014-01-25: 2500 mg via INTRAVENOUS
  Filled 2014-01-25: qty 2500

## 2014-01-25 MED ORDER — INSULIN ASPART 100 UNIT/ML ~~LOC~~ SOLN
2.0000 [IU] | Freq: Three times a day (TID) | SUBCUTANEOUS | Status: DC
  Administered 2014-01-25 – 2014-01-26 (×3): 2 [IU] via SUBCUTANEOUS

## 2014-01-25 NOTE — Progress Notes (Signed)
Placed pt. On BIPAP 8/4 per MD order with a back up rate of 10 & 30% FIO2 via FFM. Pt. Is tolerating BIPAP well at this time without any complications.

## 2014-01-25 NOTE — Progress Notes (Signed)
[facility] ROUNDING NOTE   Subjective:   Interval History: ate lunch and appears comfortable no complaints   Objective:  Vital signs in last 24 hours:  Temp:  [97.9 F (36.6 C)-98.8 F (37.1 C)] 98.8 F (37.1 C) (02/24 1214) Pulse Rate:  [61-111] 73 (02/24 1300) Resp:  [18-31] 30 (02/24 1214) BP: (113-171)/(39-74) 171/63 mmHg (02/24 1214) SpO2:  [99 %-100 %] 100 % (02/24 1214) FiO2 (%):  [30 %] 30 % (02/24 0003) Weight:  [136.3 kg (300 lb 7.8 oz)] 136.3 kg (300 lb 7.8 oz) (02/23 1609)  Weight change: 0 kg (0 lb) Filed Weights   01/24/14 0700 01/24/14 1159 01/24/14 1609  Weight: 139.3 kg (307 lb 1.6 oz) 139.3 kg (307 lb 1.6 oz) 136.3 kg (300 lb 7.8 oz)    Intake/Output: I/O last 3 completed shifts: In: 483 [P.O.:480; I.V.:3] Out: 3675 [Urine:675; Other:3000]   Intake/Output this shift:     CVS- RRR  RS- CTA Diminished at base  ABD- BS present soft non-distended  EXT- trace edema    Basic Metabolic Panel:  Recent Labs Lab 01/21/14 1300 01/22/14 0323 01/23/14 0310 01/24/14 0339 01/25/14 0633  NA 139 142 136* 138 139  K 3.8 4.0 4.0 4.1 3.8  CL 97 101 96 98 97  CO2 29 29 25 27 28   GLUCOSE 307* 192* 179* 158* 167*  BUN 46* 29* 46* 61* 29*  CREATININE 3.18* 2.88* 3.78* 3.89* 2.67*  CALCIUM 8.2* 8.2* 8.6 8.5 8.4  PHOS 4.9* 3.9 4.0 4.6 3.5    Liver Function Tests:  Recent Labs Lab 01/20/14 0912  01/21/14 1300 01/22/14 0323 01/23/14 0310 01/24/14 0339 01/25/14 0633  AST 15  --   --   --   --   --   --   ALT 9  --   --   --   --   --   --   ALKPHOS 69  --   --   --   --   --   --   BILITOT 0.2*  --   --   --   --   --   --   PROT 6.3  --   --   --   --   --   --   ALBUMIN 3.0*  < > 2.8* 2.8* 2.7* 2.7* 2.7*  < > = values in this interval not displayed. No results found for this basename: LIPASE, AMYLASE,  in the last 168 hours No results found for this basename: AMMONIA,  in the last 168 hours  CBC:  Recent Labs Lab  01/20/14 0226 01/21/14 0235 01/21/14 1300 01/22/14 0323 01/25/14 0630  WBC 13.6* 13.1* 15.4* 15.2* 16.6*  HGB 7.9* 7.5* 7.8* 7.9* 8.4*  HCT 25.4* 24.3* 25.2* 26.1* 26.4*  MCV 92.7 92.4 92.3 93.9 92.6  PLT 254 220 212 213 227    Cardiac Enzymes: No results found for this basename: CKTOTAL, CKMB, CKMBINDEX, TROPONINI,  in the last 168 hours  BNP: No components found with this basename: POCBNP,   CBG:  Recent Labs Lab 01/24/14 0817 01/24/14 1650 01/24/14 2139 01/25/14 0812 01/25/14 1213  GLUCAP 126* 109* 211* 153* 216*    Microbiology: Results for orders placed during the hospital encounter of 01/15/14  MRSA PCR SCREENING     Status: None   Collection Time    01/19/14 11:38 AM      Result Value Ref Range Status   MRSA by PCR NEGATIVE  NEGATIVE Final   Comment:  The GeneXpert MRSA Assay (FDA     approved for NASAL specimens     only), is one component of a     comprehensive MRSA colonization     surveillance program. It is not     intended to diagnose MRSA     infection nor to guide or     monitor treatment for     MRSA infections.  CULTURE, BLOOD (ROUTINE X 2)     Status: None   Collection Time    01/20/14  8:47 AM      Result Value Ref Range Status   Specimen Description BLOOD RIGHT ARM   Final   Special Requests BOTTLES DRAWN AEROBIC AND ANAEROBIC 5MLS   Final   Culture  Setup Time     Final   Value: 01/20/2014 12:18     Performed at [facility]   Culture     Final   Value:        BLOOD CULTURE RECEIVED NO GROWTH TO DATE CULTURE WILL BE HELD FOR 5 DAYS BEFORE ISSUING A FINAL NEGATIVE REPORT     Performed at [facility]   Report Status PENDING   Incomplete  CULTURE, BLOOD (ROUTINE X 2)     Status: None   Collection Time    01/20/14  9:02 AM      Result Value Ref Range Status   Specimen Description BLOOD HEMODIALYSIS CATHETER   Final   Special Requests BOTTLES DRAWN AEROBIC AND ANAEROBIC 5CC   Final   Culture  Setup Time      Final   Value: 01/20/2014 13:57     Performed at [facility]   Culture     Final   Value:        BLOOD CULTURE RECEIVED NO GROWTH TO DATE CULTURE WILL BE HELD FOR 5 DAYS BEFORE ISSUING A FINAL NEGATIVE REPORT     Performed at [facility]   Report Status PENDING   Incomplete    Coagulation Studies: No results found for this basename: LABPROT, INR,  in the last 72 hours  Urinalysis: No results found for this basename: COLORURINE, APPERANCEUR, LABSPEC, PHURINE, GLUCOSEU, HGBUR, BILIRUBINUR, KETONESUR, PROTEINUR, UROBILINOGEN, NITRITE, LEUKOCYTESUR,  in the last 72 hours    Imaging: No results found.   Medications:     . B-complex with vitamin C  1 tablet Oral Daily  . cholecalciferol  2,000 Units Oral Daily  . darbepoetin (ARANESP) injection - DIALYSIS  100 mcg Intravenous Q Mon-HD  . heparin subcutaneous  5,000 Units Subcutaneous 3 times per day  . insulin aspart  0-15 Units Subcutaneous TID WC  . insulin aspart  0-5 Units Subcutaneous QHS  . insulin aspart  2 Units Subcutaneous TID WC  . insulin glargine  28 Units Subcutaneous QHS  . iron polysaccharides  150 mg Oral Daily  . labetalol  100 mg Oral BID  . omega-3 acid ethyl esters  1 g Oral Daily  . prazosin  10 mg Oral QHS  . sodium chloride  3 mL Intravenous Q12H   sodium chloride, sodium chloride, sodium chloride, acetaminophen, albuterol, feeding supplement (NEPRO CARB STEADY), heparin, heparin, heparin, heparin, labetalol, lidocaine (PF), lidocaine-prilocaine, ondansetron (ZOFRAN) IV, pentafluoroprop-tetrafluoroeth, sodium chloride  Assessment/ Plan:  68 y.o. female with a history of HTN, DM2, Hyperlipidemia and CKD, who presented to the ED 2/14 with complaints of worsening SOB, and swelling of both of her legs over the past month. In the ED she was found  to be in volume overload. After she was admitted a diagnosis of AKI on CKD was made, and it became clear she would need HD for volume removal The pt was  taken to the OR on 2/18 for placement of a R IJ HD catheter. 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

## 2014-01-25 NOTE — Progress Notes (Signed)
[place] TEAM 1 - Stepdown/ICU TEAM Progress Note  Jacqueline [name] [id number] DOB: 1946-02-09 DOA: 01/15/2014 PCP: Salena Saner., MD  Admit HPI / Brief Narrative: 68 y.o. female with a history of HTN, DM2, Hyperlipidemia and CKD, who presented to the ED 2/14 with complaints of worsening SOB, and swelling of both of her legs over the past month. In the ED she was found to be in volume overload.  After she was admitted a diagnosis of AKI on CKD was made, and it became clear she would need HD for volume removal  The pt was taken to the OR on 2/18 for placement of a R IJ HD catheter.  After the OR procedue she had to be taken to the ICU because she became apneic during the vascular procedure, requiring full intubation.   SIGNIFICANT EVENTS / STUDIES:  2/18 R IJ tunneled HD cath  2/18 intubated briefly after surgery  HPI/Subjective: Doing well. Noted to have elevated WBC count. Admits to coughing up yellow sputum for a few days. Also has pain in left ankle and was treated with a 6 day course of steroids just before coming to the hospital by per PCP for gout. Foley cath removed- has not voided yet but still feels a burning "down there". No vomiting or diarrhea.   Assessment/Plan:  Acute on chronic respiratory failure  Multifactorial with primary issues being OHS/OSA as well as volume overload in the setting of end-stage renal disease - see below   Suspected OSA / OHS  Auto-set BiPAP QHS - will need f/u with a sleep MD as outpatient for a formal sleep study - Pulm to arrange - prognosis is quite poor unless pt decides to comply w/ BIPAP use - cont to educate - pt used BIPAP and difference in mental status quite remarkable   Progressive CKD due to DM, HTN, CHF > now ESRD Per Nephrology - HD has been initiated - failed trial of diuretic - per Neph will need permanent HD access prior to d/c- I have consulted vascular surgery (Dr Scot Dock) today  - clearly is high risk for same and agree  doing while inpatient is safest option as pt will require intubation and likely prolonged vent support in peri-operative period  Leukocytosis - has a couple of sources- gout, acute bronchitis vs PNA- will obtain xray of ankle and chest and a UA - Uric acid normal but this does not definitively rule out Gout - hold off on starting medications until source becomes clearer - blood cultures from admission negative  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 Nutrition consult   Anemia of chronic kidney disease  Fe and epo per Nephrology - CBC appears to be rising  Code Status: FULL Family Communication: no family present at time of exam Disposition Plan: SDU (new start BIPAP)  Consultants: [facility]CTS Nephrology PCCM  Antibiotics: None  DVT prophylaxis: SQ heparin   Objective: Blood pressure 171/63, pulse 73, temperature 98.8 F (37.1 C), temperature source Oral, resp. rate 30, height 5\' 5"  (1.651 m), weight 136.3 kg (300 lb 7.8 oz), SpO2 100.00%.  Intake/Output Summary (Last  24 hours) at 01/25/14 1322 Last data filed at 01/24/14 1700  Gross per 24 hour  Intake    120 ml  Output   3300 ml  Net  -3180 ml   Exam: General: No acute respiratory distress - alert and conversant  Lungs: Poor air movement/distant breath sounds throughout all fields with no appreciable wheezes Cardiovascular: Distant heart sounds - regular rate and rhythm - no appreciable murmur Abdomen: Morbidly obese, nontender, nondistended, soft, bowel sounds positive, no rebound, no ascites, no appreciable mass Extremities: No significant cyanosis, or clubbing;  No edema right lower extremities-  tenderness and mild edema in medial malleolus left ankle without erythema or increased warmth  Data Reviewed: Basic Metabolic Panel:  Recent Labs Lab 01/21/14 1300 01/22/14 0323 01/23/14 0310 01/24/14 0339 01/25/14 0633  NA 139 142 136* 138 139  K 3.8 4.0 4.0 4.1 3.8  CL 97 101 96 98 97  CO2 29 29 25 27 28   GLUCOSE 307* 192* 179* 158* 167*  BUN 46* 29* 46* 61* 29*  CREATININE 3.18* 2.88* 3.78* 3.89* 2.67*  CALCIUM 8.2* 8.2* 8.6 8.5 8.4  PHOS 4.9* 3.9 4.0 4.6 3.5   Liver Function Tests:  Recent Labs Lab 01/20/14 0912  01/21/14 1300 01/22/14 0323 01/23/14 0310 01/24/14 0339 01/25/14 0633  AST 15  --   --   --   --   --   --   ALT 9  --   --   --   --   --   --   ALKPHOS 69  --   --   --   --   --   --   BILITOT 0.2*  --   --   --   --   --   --   PROT 6.3  --   --   --   --   --   --   ALBUMIN 3.0*  < > 2.8* 2.8* 2.7* 2.7* 2.7*  < > = values in this interval not displayed. CBC:  Recent Labs Lab 01/20/14 0226 01/21/14 0235 01/21/14 1300 01/22/14 0323 01/25/14 0630  WBC 13.6* 13.1* 15.4* 15.2* 16.6*  HGB 7.9* 7.5* 7.8* 7.9* 8.4*  HCT 25.4* 24.3* 25.2* 26.1* 26.4*  MCV 92.7 92.4 92.3 93.9 92.6  PLT 254 220 212 213 227   CBG:  Recent Labs Lab 01/24/14 0817 01/24/14 1650 01/24/14 2139 01/25/14 0812 01/25/14 1213  GLUCAP 126* 109* 211* 153* 216*    Recent Results (from the past 240 hour(s))  MRSA PCR SCREENING     Status: None   Collection Time    01/19/14 11:38 AM      Result Value Ref Range Status   MRSA by PCR NEGATIVE  NEGATIVE Final   Comment:            The GeneXpert MRSA Assay (FDA     approved for NASAL specimens     only), is one component of a     comprehensive MRSA colonization     surveillance program. It is not     intended to diagnose MRSA     infection nor to guide or     monitor treatment for     MRSA infections.  CULTURE, BLOOD (ROUTINE X 2)     Status: None   Collection Time    01/20/14  8:47 AM      Result Value Ref Range  Status   Specimen Description BLOOD RIGHT ARM   Final   Special  Requests BOTTLES DRAWN AEROBIC AND ANAEROBIC 5MLS   Final   Culture  Setup Time     Final   Value: 01/20/2014 12:18     Performed at [facility]   Culture     Final   Value:        BLOOD CULTURE RECEIVED NO GROWTH TO DATE CULTURE WILL BE HELD FOR 5 DAYS BEFORE ISSUING A FINAL NEGATIVE REPORT     Performed at [facility]   Report Status PENDING   Incomplete  CULTURE, BLOOD (ROUTINE X 2)     Status: None   Collection Time    01/20/14  9:02 AM      Result Value Ref Range Status   Specimen Description BLOOD HEMODIALYSIS CATHETER   Final   Special Requests BOTTLES DRAWN AEROBIC AND ANAEROBIC 5CC   Final   Culture  Setup Time     Final   Value: 01/20/2014 13:57     Performed at [facility]   Culture     Final   Value:        BLOOD CULTURE RECEIVED NO GROWTH TO DATE CULTURE WILL BE HELD FOR 5 DAYS BEFORE ISSUING A FINAL NEGATIVE REPORT     Performed at [facility]   Report Status PENDING   Incomplete     Studies:  Recent x-ray studies have been reviewed in detail by the Attending Physician  Scheduled Meds:  Scheduled Meds: . B-complex with vitamin C  1 tablet Oral Daily  . cholecalciferol  2,000 Units Oral Daily  . darbepoetin (ARANESP) injection - DIALYSIS  100 mcg Intravenous Q Mon-HD  . heparin subcutaneous  5,000 Units Subcutaneous 3 times per day  . insulin aspart  0-15 Units Subcutaneous TID WC  . insulin aspart  0-5 Units Subcutaneous QHS  . insulin glargine  26 Units Subcutaneous QHS  . iron polysaccharides  150 mg Oral Daily  . labetalol  100 mg Oral BID  . omega-3 acid ethyl esters  1 g Oral Daily  . prazosin  10 mg Oral QHS  . sodium chloride  3 mL Intravenous Q12H    Time spent on care of this patient: 25 mins   [place]  [phone] Pager - Text Page per Shea Evans as per below:  On-Call/Text Page:      [email or web address]      password  [email or web address]  If 7PM-7AM, please contact night-coverage [email or web address] Password [email or web address] 01/25/2014, 1:22 PM   LOS: 10 days

## 2014-01-25 NOTE — Progress Notes (Signed)
ANTIBIOTIC CONSULT NOTE - INITIAL  Pharmacy Consult for vancomycin and cefepime Indication: pneumonia  Allergies  Allergen Reactions  . Sulfa Drugs Cross Reactors Other (See Comments)    Doesn't remember     Patient Measurements: Height: 5\' 5"  (165.1 cm) Weight: 300 lb 7.8 oz (136.3 kg) IBW/kg (Calculated) : 57   Vital Signs: Temp: 98.8 F (37.1 C) (02/24 1214) Temp src: Oral (02/24 1214) BP: 171/63 mmHg (02/24 1214) Pulse Rate: 73 (02/24 1300) Intake/Output from previous day: 02/23 0701 - 02/24 0700 In: 360 [P.O.:360] Out: 3400 [Urine:400] Intake/Output from this shift:    Labs:  Recent Labs  01/23/14 0310 01/24/14 0339 01/25/14 0630 01/25/14 0633  WBC  --   --  16.6*  --   HGB  --   --  8.4*  --   PLT  --   --  227  --   CREATININE 3.78* 3.89*  --  2.67*   Estimated Creatinine Clearance: 27.5 ml/min (by C-G formula based on Cr of 2.67). No results found for this basename: VANCOTROUGH, [name], VANCORANDOM, [place], GENTPEAK, GENTRANDOM, TOBRATROUGH, TOBRAPEAK, TOBRARND, AMIKACINPEAK, AMIKACINTROU, AMIKACIN,  in the last 72 hours   Microbiology: Recent Results (from the past 720 hour(s))  MRSA PCR SCREENING     Status: None   Collection Time    01/19/14 11:38 AM      Result Value Ref Range Status   MRSA by PCR NEGATIVE  NEGATIVE Final   Comment:            The GeneXpert MRSA Assay (FDA     approved for NASAL specimens     only), is one component of a     comprehensive MRSA colonization     surveillance program. It is not     intended to diagnose MRSA     infection nor to guide or     monitor treatment for     MRSA infections.  CULTURE, BLOOD (ROUTINE X 2)     Status: None   Collection Time    01/20/14  8:47 AM      Result Value Ref Range Status   Specimen Description BLOOD RIGHT ARM   Final   Special Requests BOTTLES DRAWN AEROBIC AND ANAEROBIC 5MLS   Final   Culture  Setup Time     Final   Value: 01/20/2014 12:18     Performed at [facility]   Culture     Final   Value:        BLOOD CULTURE RECEIVED NO GROWTH TO DATE CULTURE WILL BE HELD FOR 5 DAYS BEFORE ISSUING A FINAL NEGATIVE REPORT     Performed at [facility]   Report Status PENDING   Incomplete  CULTURE, BLOOD (ROUTINE X 2)     Status: None   Collection Time    01/20/14  9:02 AM      Result Value Ref Range Status   Specimen Description BLOOD HEMODIALYSIS CATHETER   Final   Special Requests BOTTLES DRAWN AEROBIC AND ANAEROBIC 5CC   Final   Culture  Setup Time     Final   Value: 01/20/2014 13:57     Performed at [facility]   Culture     Final   Value:        BLOOD CULTURE RECEIVED NO GROWTH TO DATE CULTURE WILL BE HELD FOR 5 DAYS BEFORE ISSUING A FINAL NEGATIVE REPORT     Performed at [facility]   Report Status  PENDING   Incomplete    Medical History: Past Medical History  Diagnosis Date  . Hypertension   . Diabetes mellitus   . Hyperlipidemia   . Obesity     Medications:  Prescriptions prior to admission  Medication Sig Dispense Refill  . albuterol (PROVENTIL HFA;VENTOLIN HFA) 108 (90 BASE) MCG/ACT inhaler Inhale 2 puffs into the lungs every 6 (six) hours as needed for wheezing or shortness of breath.      Marland Kitchen amLODipine-olmesartan (AZOR) 5-40 MG per tablet Take 1 tablet by mouth daily.        Marland Kitchen b complex vitamins capsule Take 1 capsule by mouth daily.      . cholecalciferol (VITAMIN D) 1000 UNITS tablet Take 2,000 Units by mouth daily.      . fish oil-omega-3 fatty acids 1000 MG capsule Take 1 g by mouth daily.        . Garlic [id number] MG CAPS Take 2 capsules by mouth daily.        Marland Kitchen glipiZIDE (GLUCOTROL) 10 MG tablet Take 10 mg by mouth 2 (two) times daily before a meal.        . magnesium 30 MG tablet Take 30 mg by mouth 2 (two) times daily.      . nebivolol (BYSTOLIC) 10 MG tablet Take 10 mg by mouth daily.        Marland Kitchen OVER THE COUNTER MEDICATION Gout out herbal. 1 tablet once daily      . polysaccharide iron (NIFEREX)  150 MG CAPS capsule Take 150 mg by mouth daily.        . potassium chloride (KLOR-CON) 20 MEQ packet Take 20 mEq by mouth daily.        . pravastatin (PRAVACHOL) 20 MG tablet Take 20 mg by mouth at bedtime.        . prazosin (MINIPRESS) 5 MG capsule Take 10 mg by mouth at bedtime.        . torsemide (DEMADEX) 10 MG tablet Take 2 tablets (20 mg total) by mouth 2 (two) times daily.  30 tablet  0   Assessment: 68 yo lady to start broad spectrum antibiotics for HCAP.  She is now felt to be ESRD.  A HD catheter has been placed.  Last HD 2/23.  Goal of Therapy:  preHD vanc level 20-25 mg/L  Plan:  Vancomycin 2500 mg IV X 1 Cefepime 2gm IV x 1 Will f/u future dosing based on HD schedule.  Jacqueline [name] 01/25/2014,3:18 PM

## 2014-01-25 NOTE — Progress Notes (Addendum)
[facility]  HPI: 68 y/o female was first consulted on 01/18/2014 for Diatek and AV fistula or AV graft.  She was taken to the OR and Dr. Donnetta Hutching placed a Right IJ hemodialysis catheter.  The patient went into respiratory arrest intraoperatively, so we re-scheduled the access for a later date.  Dr. Donnetta Hutching spoke with the patient on 01/21/2014 and she decided that she did not want to proceed with permanent access at this time.  We have been re consulted for permament access today.  The patient agrees to have the access surgery.    Subjective  - She was scared to be put to sleep for more surgery.  Objective 165/62 67 98.8 F (37.1 C) (Oral) 27 100%  Intake/Output Summary (Last 24 hours) at 01/25/14 1154 Last data filed at 01/24/14 1700  Gross per 24 hour  Intake    120 ml  Output   3300 ml  Net  -3180 ml   PE: Palpable radial pulses bilateral, sensation grossly intact and equal. Lungs: distant breath sounds, no wheezing, no acute distress on RA Heart: RRR  Assessment/Planning:  Left upper arm fistula creation. I offered this for this coming Thursday 01/27/2014 with Dr. Kellie Simmering. [facility] 01/25/2014 11:54 AM  Agree with above. Her previous vein map shows a reasonable left upper arm cephalic vein. She has triphasic signals in the left wrist and a palpable left radial pulse. She appears to be a good candidate for a left forearm or upper arm AV fistula. This has been scheduled for Thursday by Dr. Kellie Simmering. I have discussed the procedure potential complications with the patient today and she is agreeable to proceed. She has a functioning tunneled dialysis catheter.  Deitra Mayo, MD, FACS Beeper [phone] 01/25/2014

## 2014-01-26 LAB — RENAL FUNCTION PANEL
ALBUMIN: 2.6 g/dL — AB (ref 3.5–5.2)
BUN: 44 mg/dL — ABNORMAL HIGH (ref 6–23)
CALCIUM: 8.4 mg/dL (ref 8.4–10.5)
CO2: 26 meq/L (ref 19–32)
Chloride: 95 mEq/L — ABNORMAL LOW (ref 96–112)
Creatinine, Ser: 3.37 mg/dL — ABNORMAL HIGH (ref 0.50–1.10)
GFR, EST AFRICAN AMERICAN: 15 mL/min — AB (ref >90)
GFR, EST NON AFRICAN AMERICAN: 13 mL/min — AB (ref >90)
Glucose, Bld: 167 mg/dL — ABNORMAL HIGH (ref 70–99)
PHOSPHORUS: 4.3 mg/dL (ref 2.3–4.6)
Potassium: 3.8 mEq/L (ref 3.7–5.3)
SODIUM: 135 meq/L — AB (ref 137–147)

## 2014-01-26 LAB — CULTURE, BLOOD (ROUTINE X 2)
CULTURE: NO GROWTH
CULTURE: NO GROWTH

## 2014-01-26 LAB — CBC
HCT: 25.7 % — ABNORMAL LOW (ref 36.0–46.0)
Hemoglobin: 8.1 g/dL — ABNORMAL LOW (ref 12.0–15.0)
MCH: 28.9 pg (ref 26.0–34.0)
MCHC: 31.5 g/dL (ref 30.0–36.0)
MCV: 91.8 fL (ref 78.0–100.0)
PLATELETS: 230 10*3/uL (ref 150–400)
RBC: 2.8 MIL/uL — ABNORMAL LOW (ref 3.87–5.11)
RDW: 16.3 % — ABNORMAL HIGH (ref 11.5–15.5)
WBC: 15.6 10*3/uL — AB (ref 4.0–10.5)

## 2014-01-26 LAB — GLUCOSE, CAPILLARY
Glucose-Capillary: 154 mg/dL — ABNORMAL HIGH (ref 70–99)
Glucose-Capillary: 177 mg/dL — ABNORMAL HIGH (ref 70–99)
Glucose-Capillary: 121 mg/dL — ABNORMAL HIGH (ref 70–99)

## 2014-01-26 LAB — BASIC METABOLIC PANEL
BUN: 45 mg/dL — AB (ref 6–23)
CALCIUM: 8.5 mg/dL (ref 8.4–10.5)
CHLORIDE: 94 meq/L — AB (ref 96–112)
CO2: 25 mEq/L (ref 19–32)
CREATININE: 3.37 mg/dL — AB (ref 0.50–1.10)
GFR, EST AFRICAN AMERICAN: 15 mL/min — AB (ref >90)
GFR, EST NON AFRICAN AMERICAN: 13 mL/min — AB (ref >90)
Glucose, Bld: 170 mg/dL — ABNORMAL HIGH (ref 70–99)
Potassium: 3.8 mEq/L (ref 3.7–5.3)
Sodium: 134 mEq/L — ABNORMAL LOW (ref 137–147)

## 2014-01-26 MED ORDER — NEPRO/CARBSTEADY PO LIQD: 237.0000 mL | ORAL | Status: DC | PRN

## 2014-01-26 MED ORDER — SODIUM CHLORIDE 0.9 % IV SOLN: 100.0000 mL | INTRAVENOUS | Status: DC | PRN

## 2014-01-26 MED ORDER — LIDOCAINE HCL (PF) 1 % IJ SOLN: 5.0000 mL | INTRAMUSCULAR | Status: DC | PRN

## 2014-01-26 MED ORDER — ALTEPLASE 2 MG IJ SOLR
2.0000 mg | Freq: Once | INTRAMUSCULAR | Status: AC | PRN
  Filled 2014-01-26: qty 2

## 2014-01-26 MED ORDER — VANCOMYCIN HCL IN DEXTROSE 1-5 GM/200ML-% IV SOLN
1000.0000 mg | Freq: Once | INTRAVENOUS | Status: AC
  Administered 2014-01-26: 1000 mg via INTRAVENOUS
  Filled 2014-01-26: qty 200

## 2014-01-26 MED ORDER — PENTAFLUOROPROP-TETRAFLUOROETH EX AERO: 1.0000 "application " | INHALATION_SPRAY | CUTANEOUS | Status: DC | PRN

## 2014-01-26 MED ORDER — VANCOMYCIN HCL IN DEXTROSE 1-5 GM/200ML-% IV SOLN
1000.0000 mg | INTRAVENOUS | Status: DC
  Administered 2014-01-28: 1000 mg via INTRAVENOUS
  Filled 2014-01-26 (×3): qty 200

## 2014-01-26 MED ORDER — LIDOCAINE-PRILOCAINE 2.5-2.5 % EX CREA: 1.0000 "application " | TOPICAL_CREAM | CUTANEOUS | Status: DC | PRN

## 2014-01-26 MED ORDER — HEPARIN SODIUM (PORCINE) 1000 UNIT/ML DIALYSIS: 1000.0000 [IU] | INTRAMUSCULAR | Status: DC | PRN

## 2014-01-26 MED ORDER — DEXTROSE 5 % IV SOLN
2.0000 g | INTRAVENOUS | Status: DC
  Administered 2014-01-26 – 2014-01-28 (×2): 2 g via INTRAVENOUS
  Filled 2014-01-26 (×3): qty 2

## 2014-01-26 MED ORDER — DEXTROSE 5 % IV SOLN
2.0000 g | Freq: Once | INTRAVENOUS | Status: AC
  Filled 2014-01-26: qty 2

## 2014-01-26 MED ORDER — INSULIN ASPART 100 UNIT/ML ~~LOC~~ SOLN
4.0000 [IU] | Freq: Three times a day (TID) | SUBCUTANEOUS | Status: DC
  Administered 2014-01-26: 2 [IU] via SUBCUTANEOUS
  Administered 2014-01-27 – 2014-01-28 (×3): 4 [IU] via SUBCUTANEOUS

## 2014-01-26 NOTE — Progress Notes (Signed)
Clinical Social Work Department BRIEF PSYCHOSOCIAL ASSESSMENT 01/26/2014  Patient:  Jacqueline [name], Jacqueline [name]     Account Number:  [id number]     Admit date:  01/15/2014  Clinical Social Worker:  Freeman Caldron  Date/Time:  01/26/2014 03:24 PM  Referred by:  Physician  Date Referred:  01/26/2014 Referred for  SNF Placement   Other Referral:   Interview type:  Patient Other interview type:    PSYCHOSOCIAL DATA Living Status:  FAMILY Admitted from facility:   Level of care:   Primary support name:  Dannette Barbara [phone]) Primary support relationship to patient:  SPOUSE Degree of support available:   Good--pt lives with spouse and has support from son.    CURRENT CONCERNS Current Concerns  Post-Acute Placement   Other Concerns:    SOCIAL WORK ASSESSMENT / PLAN CSW came to pt's room to complete assessment and discuss SNF, and pt was sitting up in bed. CSW explained referral for SNF, and pt states she feels this is a good idea because she did feel like she benefited from exercising with PT. CSW provided list of SNFs to pt and wrote CSW name and phone number at the top, explaining that pt and her husband can talk about preferences and let CSW about their top choices. Pt states [facility] is the first choice, and CSW has sent referral to [facility] and the other SNFs in [facility] CSW explained CSW would do this and let pt and husband know which facilities say yes so they will know which options they have instead of trying to filter through the extensive list of all 22 facilities. Pt states this is a good idea and will keep the list for reference. CSW asked if there are any questions or concerns pt has at this time, and pt says she does not. CSW invited pt to call if anything does come up, and pt thanked CSW.    [facility] responded "no" to bed request, and CSW called admissions and asked why they cannot take pt. Administrator states she did not review the information and  that one of her coworkers reveiwed and responded no. CSW asked if the "no" could be because of pt's weight and if they have a weight limit. Administrator states weight limit is 300 lbs; pt currently listed at 302. CSW explained pt is on a diet and if 302 lbs is accurate, she should be under 300 before discharge. Administrator looking over everything again after understanding this and will let CSW know if they might be able to offer a bed if pt is under 300 lbs.    [facility] asked where dialysis is arranged for pt, and CSW called dialysis here in the hospital and was told pt is set up with [facility]. CSW sent a message to [facility] letting them know this and provided the phone number for them to call dialysis in the hospital if they have additional questions.   Assessment/plan status:  Psychosocial Support/Ongoing Assessment of Needs Other assessment/ plan:   Information/referral to community resources:   SNF    PATIENT'S/FAMILY'S RESPONSE TO PLAN OF CARE: Good--pt friendly and engaged in conversation with CSW. Pt states she does not have any concerns at this time but will call CSW if anything comes up. CSW invited pt to call any time and assured pt that CSW will follow up with bed offers when facilities respond to request. Pt thanked CSW for assistance.       Ky Barban, MSW, Latanya Presser  Clinical Social Worker [phone] 

## 2014-01-26 NOTE — Plan of Care (Signed)
Problem: Food- and Nutrition-Related Knowledge Deficit (NB-1.1) Goal: Nutrition education Formal process to instruct or train a patient/client in a skill or to impart knowledge to help patients/clients voluntarily manage or modify food choices and eating behavior to maintain or improve health. Outcome: Progressing  RD consulted for nutrition education regarding diabetes, renal disease, low sodium diet.     Lab Results  Component Value Date    HGBA1C 7.3* 01/16/2014    RD provided "Renal and Diabetes Nutrition Therapy" handout from the Academy of [employer]etics. Discussed different food groups and their effects on blood sugar, emphasizing carbohydrate-containing foods.  Pt states she just found out from PCP that she has a wheat allergy.   Discussed the difference between wheat and gluten intolerance.    Provided list of carbohydrates and recommended serving sizes of common foods as well as appropriate sources.  Discussed importance of controlled and consistent carbohydrate intake throughout the day. Provided examples of ways to balance meals/snacks and encouraged intake of high-fiber, whole grain complex carbohydrates. Teach back method used.  Expect good compliance.  Body mass index is 50.37 kg/(m^2). Pt meets criteria for morbid obesity based on current BMI.  Current diet order is NPO, patient is previously consuming approximately 75% of meals at this time. Labs and medications reviewed. No further nutrition interventions warranted at this time. RD contact information provided. If additional nutrition issues arise, please re-consult RD.  Brynda Greathouse, MS RD LDN Clinical Inpatient Dietitian Pager: [phone] Weekend/After hours pager: [phone]

## 2014-01-26 NOTE — Evaluation (Signed)
Physical Therapy Evaluation Patient Details Name: Jacqueline [name] MRN: [id number] DOB: 05/23/1946 Today's Date: 01/26/2014 Time: [id number] PT Time Calculation (min): 31 min  PT Assessment / Plan / Recommendation History of Present Illness  68 y.o. female with a history of HTN, DM2, Hyperlipidemia and CKD, who presented to the ED 2/14 with complaints of worsening SOB, and swelling of both of her legs over the past month. In the ED she was found to be in volume overload.  After she was admitted a diagnosis of AKI on CKD was made, and it became clear she would need HD for volume removal  The pt was taken to the OR on 2/18 for placement of a R IJ HD catheter.  After the OR procedue she had to be taken to the ICU because she became apneic during the vascular procedure, requiring full intubation.   Clinical Impression  Pt presents with generalized weakness and low motivation to mobilize. She c/o neck pain on the left side once sitting EOB, massage and stretching relieved but she reported that she could not ambulate because of it. At current functional level, recommend SNF as best d/c option with pt. Pt will benefit from acute PT to increase strength and safe mobility for increasing independence and decreasing burden of care.    PT Assessment  Patient needs continued PT services    Follow Up Recommendations  SNF;Supervision/Assistance - 24 hour    Does the patient have the potential to tolerate intense rehabilitation      Barriers to Discharge Decreased caregiver support pt's husband works during [employer] research (medical) with 5" wheels (bariatric size)    Recommendations for Other Services OT consult   Frequency Min 3X/week    Precautions / Restrictions Precautions Precautions: Fall Restrictions Weight Bearing Restrictions: No   Pertinent Vitals/Pain 100% O2 sats on 2L, dropped to 85% on RA so reapplied Faces pain 4/10 left neck, pt also reports that arch of left  foot hurts      Mobility  Bed Mobility Overal bed mobility: Needs Assistance Bed Mobility: Supine to Sit Supine to sit: Supervision;HOB elevated General bed mobility comments: did not require physical assistance to get to EOB but requested to keep HOB up at 70 degree incline and used rail. Suspect that pt would need significant physical assist to get up from flat surface Transfers Overall transfer level: Needs assistance Equipment used: Rolling walker (2 wheeled) Transfers: Sit to/from Stand Sit to Stand: Min assist General transfer comment: min A to steady and for extension through trunk. Pt with kyphotic posture in sitting, slightly improved in standing. Pt c/o neck pain in sitting and reported it was slightly better in standing when posture was improved but pt would not maintain standing more than 30 sec at a time Ambulation/Gait General Gait Details: plan was to ambulate within room and pt in agreement but once sitting, she began to c/o neck pain and felt that she could not maintain unsupported position. Pt encouraged in progressing to ambulation next session    Exercises General Exercises - Lower Extremity Ankle Circles/Pumps: AROM;Both;10 reps;Seated   PT Diagnosis: Difficulty walking;Generalized weakness;Acute pain  PT Problem List: Decreased strength;Decreased activity tolerance;Decreased mobility;Decreased knowledge of use of DME;Decreased knowledge of precautions;Pain;Obesity PT Treatment Interventions: DME instruction;Gait training;Functional mobility training;Therapeutic activities;Therapeutic exercise;Balance training;Patient/family education     PT Goals(Current goals can be found in the care plan section) Acute Rehab PT Goals Patient Stated Goal: get better PT Goal Formulation: With patient  Time For Goal Achievement: 02/09/14 Potential to Achieve Goals: Fair  Visit Information  Last PT Received On: 01/26/14 Assistance Needed: +2 (safety) History of Present  Illness: 68 y.o. female with a history of HTN, DM2, Hyperlipidemia and CKD, who presented to the ED 2/14 with complaints of worsening SOB, and swelling of both of her legs over the past month. In the ED she was found to be in volume overload.  After she was admitted a diagnosis of AKI on CKD was made, and it became clear she would need HD for volume removal  The pt was taken to the OR on 2/18 for placement of a R IJ HD catheter.  After the OR procedue she had to be taken to the ICU because she became apneic during the vascular procedure, requiring full intubation.        Prior [place] expects to be discharged to:: Private residence Living Arrangements: Spouse/significant other Available Help at Discharge: Family;Available PRN/intermittently Type of Home: House Home Access: Stairs to enter [employer] of Steps: 1 Home Layout: One level Home Equipment: Cane - single point Additional Comments: spoke to pt about how she will het to HD if husband is at work and she feels she will be able to drive herself because it is close by Prior Function Level of Independence: Independent Comments: pt reports that the week before she got sick, she couldn't get in and out of the tub/ shower and began to sponge bathe Communication Communication: No difficulties    Cognition  Cognition Arousal/Alertness: Awake/alert Behavior During Therapy: Flat affect Overall Cognitive Status: Within Functional Limits for tasks assessed    Extremity/Trunk Assessment Upper Extremity Assessment Upper Extremity Assessment: Defer to OT evaluation;Overall WFL for tasks assessed Lower Extremity Assessment Lower Extremity Assessment: RLE deficits/detail;LLE deficits/detail;Generalized weakness RLE Deficits / Details: hip flx 3/5, knee flex/ ext 3/5, ankle WFL LLE Deficits / Details: grossly 3/5 throughout, pt c/o arch pain in foot with resisted plantar flexion Cervical / Trunk  Assessment Cervical / Trunk Assessment: Kyphotic   Balance Balance Overall balance assessment: Needs assistance Sitting-balance support: Bilateral upper extremity supported;Feet supported Sitting balance-Leahy Scale: Poor Sitting balance - Comments: pt relying on RW in front of her for support. Part os issue today though was neck pain rather than true balance issue Standing balance support: Bilateral upper extremity supported;During functional activity Standing balance-Leahy Scale: Poor  End of Session PT - End of Session Equipment Utilized During Treatment: Gait belt;Oxygen Activity Tolerance: Patient limited by fatigue;Patient limited by pain Patient left: in bed;with call bell/phone within reach Nurse Communication: Mobility status  GP   Leighton Roach, [place]  [place], [place] 01/26/2014, 11:12 AM

## 2014-01-26 NOTE — Progress Notes (Signed)
ANTIBIOTIC CONSULT NOTE - FOLLOW UP  Pharmacy Consult for vancomycin, cefepime Indication: PNA  Allergies  Allergen Reactions  . Sulfa Drugs Cross Reactors Other (See Comments)    Doesn't remember     Patient Measurements: Height: 5\' 5"  (165.1 cm) Weight: 302 lb 11.2 oz (137.304 kg) IBW/kg (Calculated) : 57   Vital Signs: Temp: 97.6 F (36.4 C) (02/25 1131) Temp src: Oral (02/25 1131) BP: 148/51 mmHg (02/25 1131) Pulse Rate: 75 (02/25 1131) Intake/Output from previous day: 02/24 0701 - 02/25 0700 In: 550 [IV Piggyback:550] Out: -  Intake/Output from this shift:    Labs:  Recent Labs  01/24/14 0339 01/25/14 0630 01/25/14 0633 01/26/14 0304  WBC  --  16.6*  --  15.6*  HGB  --  8.4*  --  8.1*  PLT  --  227  --  230  CREATININE 3.89*  --  2.67* 3.37*  3.37*   Estimated Creatinine Clearance: 21.8 ml/min (by C-G formula based on Cr of 3.37). No results found for this basename: VANCOTROUGH, [name], VANCORANDOM, [place], GENTPEAK, GENTRANDOM, TOBRATROUGH, TOBRAPEAK, TOBRARND, AMIKACINPEAK, AMIKACINTROU, AMIKACIN,  in the last 72 hours   Microbiology: Recent Results (from the past 720 hour(s))  MRSA PCR SCREENING     Status: None   Collection Time    01/19/14 11:38 AM      Result Value Ref Range Status   MRSA by PCR NEGATIVE  NEGATIVE Final   Comment:            The GeneXpert MRSA Assay (FDA     approved for NASAL specimens     only), is one component of a     comprehensive MRSA colonization     surveillance program. It is not     intended to diagnose MRSA     infection nor to guide or     monitor treatment for     MRSA infections.  CULTURE, BLOOD (ROUTINE X 2)     Status: None   Collection Time    01/20/14  8:47 AM      Result Value Ref Range Status   Specimen Description BLOOD RIGHT ARM   Final   Special Requests BOTTLES DRAWN AEROBIC AND ANAEROBIC 5MLS   Final   Culture  Setup Time     Final   Value: 01/20/2014 12:18     Performed at [facility]   Culture     Final   Value: NO GROWTH 5 DAYS     Performed at [facility]   Report Status 01/26/2014 FINAL   Final  CULTURE, BLOOD (ROUTINE X 2)     Status: None   Collection Time    01/20/14  9:02 AM      Result Value Ref Range Status   Specimen Description BLOOD HEMODIALYSIS CATHETER   Final   Special Requests BOTTLES DRAWN AEROBIC AND ANAEROBIC 5CC   Final   Culture  Setup Time     Final   Value: 01/20/2014 13:57     Performed at [facility]   Culture     Final   Value: NO GROWTH 5 DAYS     Performed at [facility]   Report Status 01/26/2014 FINAL   Final    Anti-infectives   Start     Dose/Rate Route Frequency Ordered Stop   01/26/14 2000  vancomycin (VANCOCIN) IVPB 1000 mg/200 mL premix     1,000 mg 200 mL/hr over 60 Minutes Intravenous  Once 01/26/14  1425     01/25/14 1530  ceFEPIme (MAXIPIME) 2 g in dextrose 5 % 50 mL IVPB     2 g 100 mL/hr over 30 Minutes Intravenous  Once 01/25/14 1524 01/25/14 1655   01/25/14 1530  vancomycin (VANCOCIN) 2,500 mg in sodium chloride 0.9 % 500 mL IVPB     2,500 mg 250 mL/hr over 120 Minutes Intravenous  Once 01/25/14 1524 01/25/14 1923   01/19/14 0600  ceFAZolin (ANCEF) IVPB 1 g/50 mL premix  Status:  Discontinued     1 g 100 mL/hr over 30 Minutes Intravenous On call 01/18/14 1521 01/18/14 1617   01/19/14 0600  [MAR Hold]  ceFAZolin (ANCEF) 3 g in dextrose 5 % 50 mL IVPB     (On MAR Hold since 01/19/14 0717)   3 g 160 mL/hr over 30 Minutes Intravenous On call to O.R. 01/18/14 1618 01/19/14 0835      Assessment: 68 yo fenale on cefepime and vancomycin for suspected PNA.  WBC= 15.6, afeb and patient noted with ESRD with HD today  2/24 cefepime 2/24 vanc>>  2/24 urine 2/19 blood x2- ngtd   Goal of Therapy:  Vancomycin trough level 15-20 mcg/ml  Plan:  -Vancomycin 1000mg  IV after HD today and will schedule for MWF for now -Cefepime 2gm IV today and will schedule for MWF for now -Will  follow renal function, cultures and clinical progress  Hildred [facility], Pharm D 01/26/2014 3:37 PM

## 2014-01-26 NOTE — Progress Notes (Addendum)
Clinical Social Work Department CLINICAL SOCIAL WORK PLACEMENT NOTE 01/26/2014  Patient:  Jacqueline [name], Jacqueline [name]  Account Number:  [id number] Admit date:  01/15/2014  Clinical Social Worker:  Ky Barban, Latanya Presser  Date/time:  01/26/2014 03:35 PM  Clinical Social Work is seeking post-discharge placement for this patient at the following level of care:   [place]   (*CSW will update this form in Epic as items are completed)   01/26/2014  Patient/family provided with [place] Department of Clinical Social Work's list of facilities offering this level of care within the geographic area requested by the patient (or if unable, by the patient's family).  01/26/2014  Patient/family informed of their freedom to choose among providers that offer the needed level of care, that participate in Medicare, Medicaid or managed care program needed by the patient, have an available bed and are willing to accept the patient.  01/26/2014  Patient/family informed of [facility]' ownership interest in [facility], as well as of the fact that they are under no obligation to receive care at this facility.  PASARR submitted to EDS on 01/26/2014 PASARR number received from EDS on 01/26/2014  FL2 transmitted to all facilities in geographic area requested by pt/family on  01/26/2014 FL2 transmitted to all facilities within larger geographic area on   Patient informed that his/her managed care company has contracts with or will negotiate with  certain facilities, including the following:     Patient/family informed of bed offers received:  01/28/14 Patient chooses bed at [facility] Physician recommends and patient chooses bed at    Patient to be transferred to [facility] on  01/28/14 Patient to be transferred to facility by [facility]  The following physician request were entered in Epic:   Additional Comments: Facility aware of discharge, as CSW has called them  throughout the day and provided phone number for dialysis here in the hospital so they can arrange set-up for dialysis center pt will utilize while at [facility]. CSW checked with pt's RN, who requested PTAR pickup at 4:30pm. CSW arranged this online.   Ky Barban, MSW, [facility] Clinical Social Worker [phone]

## 2014-01-26 NOTE — Progress Notes (Signed)
[facility] ROUNDING NOTE   Subjective:   Interval History: appears to be making progress well   Objective:  Vital signs in last 24 hours:  Temp:  [97.6 F (36.4 C)-99.1 F (37.3 C)] 97.6 F (36.4 C) (02/25 1131) Pulse Rate:  [65-138] 75 (02/25 1131) Resp:  [22-29] 25 (02/25 1131) BP: (132-173)/(41-59) 148/51 mmHg (02/25 1131) SpO2:  [85 %-100 %] 100 % (02/25 1131) FiO2 (%):  [30 %] 30 % (02/25 0046) Weight:  [137.304 kg (302 lb 11.2 oz)] 137.304 kg (302 lb 11.2 oz) (02/25 0444)  Weight change: -1.996 kg (-4 lb 6.4 oz) Filed Weights   01/24/14 1159 01/24/14 1609 01/26/14 0444  Weight: 139.3 kg (307 lb 1.6 oz) 136.3 kg (300 lb 7.8 oz) 137.304 kg (302 lb 11.2 oz)    Intake/Output: I/O last 3 completed shifts: In: 550 [IV Piggyback:550] Out: -    Intake/Output this shift:     CVS- RRR RS- CTA ABD- BS present soft non-distended EXT- trace edema   Basic Metabolic Panel:  Recent Labs Lab 01/22/14 0323 01/23/14 0310 01/24/14 0339 01/25/14 0633 01/26/14 0304  NA 142 136* 138 139 135*  134*  K 4.0 4.0 4.1 3.8 3.8  3.8  CL 101 96 98 97 95*  94*  CO2 29 25 27 28 26  25   GLUCOSE 192* 179* 158* 167* 167*  170*  BUN 29* 46* 61* 29* 44*  45*  CREATININE 2.88* 3.78* 3.89* 2.67* 3.37*  3.37*  CALCIUM 8.2* 8.6 8.5 8.4 8.4  8.5  PHOS 3.9 4.0 4.6 3.5 4.3    Liver Function Tests:  Recent Labs Lab 01/20/14 0912  01/22/14 0323 01/23/14 0310 01/24/14 0339 01/25/14 0633 01/26/14 0304  AST 15  --   --   --   --   --   --   ALT 9  --   --   --   --   --   --   ALKPHOS 69  --   --   --   --   --   --   BILITOT 0.2*  --   --   --   --   --   --   PROT 6.3  --   --   --   --   --   --   ALBUMIN 3.0*  < > 2.8* 2.7* 2.7* 2.7* 2.6*  < > = values in this interval not displayed. No results found for this basename: LIPASE, AMYLASE,  in the last 168 hours No results found for this basename: AMMONIA,  in the last 168 hours  CBC:  Recent Labs Lab  01/21/14 0235 01/21/14 1300 01/22/14 0323 01/25/14 0630 01/26/14 0304  WBC 13.1* 15.4* 15.2* 16.6* 15.6*  HGB 7.5* 7.8* 7.9* 8.4* 8.1*  HCT 24.3* 25.2* 26.1* 26.4* 25.7*  MCV 92.4 92.3 93.9 92.6 91.8  PLT 220 212 213 227 230    Cardiac Enzymes: No results found for this basename: CKTOTAL, CKMB, CKMBINDEX, TROPONINI,  in the last 168 hours  BNP: No components found with this basename: POCBNP,   CBG:  Recent Labs Lab 01/25/14 1213 01/25/14 1608 01/25/14 2152 01/26/14 0758 01/26/14 1130  GLUCAP 216* 207* 160* 154* 177*    Microbiology: Results for orders placed during the hospital encounter of 01/15/14  MRSA PCR SCREENING     Status: None   Collection Time    01/19/14 11:38 AM      Result Value Ref Range Status   MRSA  by PCR NEGATIVE  NEGATIVE Final   Comment:            The GeneXpert MRSA Assay (FDA     approved for NASAL specimens     only), is one component of a     comprehensive MRSA colonization     surveillance program. It is not     intended to diagnose MRSA     infection nor to guide or     monitor treatment for     MRSA infections.  CULTURE, BLOOD (ROUTINE X 2)     Status: None   Collection Time    01/20/14  8:47 AM      Result Value Ref Range Status   Specimen Description BLOOD RIGHT ARM   Final   Special Requests BOTTLES DRAWN AEROBIC AND ANAEROBIC 5MLS   Final   Culture  Setup Time     Final   Value: 01/20/2014 12:18     Performed at [facility]   Culture     Final   Value: NO GROWTH 5 DAYS     Performed at [facility]   Report Status 01/26/2014 FINAL   Final  CULTURE, BLOOD (ROUTINE X 2)     Status: None   Collection Time    01/20/14  9:02 AM      Result Value Ref Range Status   Specimen Description BLOOD HEMODIALYSIS CATHETER   Final   Special Requests BOTTLES DRAWN AEROBIC AND ANAEROBIC 5CC   Final   Culture  Setup Time     Final   Value: 01/20/2014 13:57     Performed at [facility]   Culture     Final    Value: NO GROWTH 5 DAYS     Performed at [facility]   Report Status 01/26/2014 FINAL   Final    Coagulation Studies: No results found for this basename: LABPROT, INR,  in the last 72 hours  Urinalysis:  Recent Labs  01/25/14 1903  COLORURINE YELLOW  LABSPEC 1.020  PHURINE 5.0  GLUCOSEU NEGATIVE  HGBUR LARGE*  BILIRUBINUR SMALL*  KETONESUR NEGATIVE  PROTEINUR 100*  UROBILINOGEN 1.0  NITRITE NEGATIVE  LEUKOCYTESUR LARGE*      Imaging: Dg Ankle Complete Left  01/25/2014   CLINICAL DATA:  Ankle pain and edema.  Tenderness.  EXAM: LEFT ANKLE COMPLETE - 3+ VIEW  COMPARISON:  DG ANKLE COMPLETE*L* dated 06/01/2005  FINDINGS: Soft tissue swelling is present in the leg and ankle. The ankle mortise is congruent. The talar dome appears intact. There is no fracture. Diabetic type small vessel atherosclerosis calcification is present. Mild midfoot osteoarthritis. Anatomic alignment. First MTP joint osteoarthritis incidentally noted.  IMPRESSION: Soft tissue swelling in the leg and ankle.  No osseous injury.   Electronically Signed   By: Dereck Ligas M.D.   On: 01/25/2014 14:34   Dg Chest Port 1 View  01/25/2014   CLINICAL DATA:  Cough, leukocytosis  EXAM: PORTABLE CHEST - 1 VIEW  COMPARISON:  January 19, 2014  FINDINGS: The heart size and mediastinal contours are stable. Heart size is enlarged. The aorta is tortuous. Right central venous line is unchanged. There is pulmonary edema. Patchy consolidation of the right lung base is noted. Evaluation of left lung base is limited due to technique. The visualized skeletal structures are stable.  IMPRESSION: Pulmonary edema.  Patchy pneumonia of medial right lung base.   Electronically Signed   By: Abelardo Diesel M.D.   On:  01/25/2014 14:37     Medications:     . B-complex with vitamin C  1 tablet Oral Daily  . cholecalciferol  2,000 Units Oral Daily  . darbepoetin (ARANESP) injection - DIALYSIS  100 mcg Intravenous Q Mon-HD  . heparin  subcutaneous  5,000 Units Subcutaneous 3 times per day  . insulin aspart  0-15 Units Subcutaneous TID WC  . insulin aspart  0-5 Units Subcutaneous QHS  . insulin aspart  2 Units Subcutaneous TID WC  . insulin glargine  28 Units Subcutaneous QHS  . iron polysaccharides  150 mg Oral Daily  . labetalol  100 mg Oral BID  . omega-3 acid ethyl esters  1 g Oral Daily  . prazosin  10 mg Oral QHS  . sodium chloride  3 mL Intravenous Q12H   sodium chloride, sodium chloride, sodium chloride, acetaminophen, albuterol, feeding supplement (NEPRO CARB STEADY), heparin, heparin, heparin, heparin, labetalol, lidocaine (PF), lidocaine-prilocaine, ondansetron (ZOFRAN) IV, pentafluoroprop-tetrafluoroeth, sodium chloride  Assessment/ Plan:  68 y.o. female with a history of HTN, DM2, Hyperlipidemia and CKD, who presented to the ED 2/14 with complaints of worsening SOB, and swelling of both of her legs over the past month. In the ED she was found to be in volume overload. After she was admitted a diagnosis of AKI on CKD was made, and it became clear she would need HD for volume removal The pt was taken to the OR on 2/18 for placement of a R IJ HD catheter. 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

## 2014-01-26 NOTE — Progress Notes (Signed)
Pt. Originally refused BIPAP for the night but has now decided to wear it. Pt. Was placed on BIPAP 8/4 per MD order, back up rate: 10 & 30%.  Pt. Is tolerating BIPAP well at this time without any complications.

## 2014-01-26 NOTE — Progress Notes (Addendum)
[facility] TEAM 1 - Stepdown/ICU TEAM Progress Note  Jacqueline [name] [id number] DOB: 08-23-1946 DOA: 01/15/2014 PCP: Salena Saner., MD  Admit HPI / Brief Narrative: 68 y.o. female with a history of HTN, DM2, Hyperlipidemia and CKD, who presented to the ED 2/14 with complaints of worsening SOB, and swelling of both of her legs over the past month. In the ED she was found to be in volume overload.  After she was admitted a diagnosis of AKI on CKD was made, and it became clear she would need HD for volume removal  The pt was taken to the OR on 2/18 for placement of a R IJ HD catheter.  After the OR procedue she had to be taken to the ICU because she became apneic during the vascular procedure, requiring full intubation.   SIGNIFICANT EVENTS / STUDIES:  2/18 R IJ tunneled HD cath  2/18 intubated briefly after surgery  HPI/Subjective: Remains alert and conversant.  No new complaints today.  Assessment/Plan:  Acute on chronic respiratory failure  Multifactorial with primary issues being OHS/OSA as well as volume overload in the setting of end-stage renal disease - see below   Suspected OSA / OHS  Auto-set BiPAP QHS - will need f/u with a sleep MD as outpatient for a formal sleep study - Pulm to arrange - prognosis is quite poor unless pt decides to comply w/ BIPAP use - cont to educate - pt has been slowly more compliant with BiPAP therapy  Progressive CKD due to DM, HTN, CHF > now ESRD Per Nephrology - HD has been initiated - failed trial of diuretic - per Neph will need permanent HD access prior to d/c - for left forearm or upper arm AV fistula 2/26 - clearly is high risk for same and agree doing while inpatient is safest option as pt will require intubation and likely prolonged vent support in peri-operative period  Right basilar healthcare acquired pneumonia Continue empiric antibiotic for planned 7 day course  UTI UA c/w UTI - abx to continue   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 Nutrition consult   Anemia of chronic kidney disease  Fe and epo per Nephrology - CBC appears to be rising  Code Status: FULL Family Communication: no family present at time of exam Disposition Plan: SDU (new start BIPAP)  Consultants: [facility] Nephrology PCCM  Antibiotics: Cefepime 2/24>> Vancomycin 2/24>>  DVT prophylaxis: SQ heparin   Objective: Blood pressure 148/51, pulse 75, temperature 97.6 F (36.4 C), temperature source Oral, resp. rate 25, height 5\' 5"  (1.651 m), weight 137.304 kg (302 lb 11.2 oz), SpO2 100.00%.  Intake/Output Summary (Last 24 hours) at 01/26/14 1534 Last data filed at 01/25/14 1723  Gross per 24 hour  Intake    550 ml  Output      0 ml  Net    550 ml   Exam: General: No acute respiratory distress - alert and conversant  Lungs: Poor air movement/distant breath sounds throughout all fields with no appreciable wheezes Cardiovascular: Distant heart sounds - regular  rate and rhythm Abdomen: Morbidly obese, nontender, nondistended, soft, bowel sounds positive, no rebound, no ascites, no appreciable mass Extremities: No significant cyanosis, or clubbing; no significant appreciable joint abnormalities or edema  Data Reviewed: Basic Metabolic Panel:  Recent Labs Lab 01/22/14 0323 01/23/14 0310 01/24/14 0339 01/25/14 0633 01/26/14 0304  NA 142 136* 138 139 135*  134*  K 4.0 4.0 4.1 3.8 3.8  3.8  CL 101 96 98 97 95*  94*  CO2 29 25 27 28 26  25   GLUCOSE 192* 179* 158* 167* 167*  170*  BUN 29* 46* 61* 29* 44*  45*  CREATININE 2.88* 3.78* 3.89*  2.67* 3.37*  3.37*  CALCIUM 8.2* 8.6 8.5 8.4 8.4  8.5  PHOS 3.9 4.0 4.6 3.5 4.3   Liver Function Tests:  Recent Labs Lab 01/20/14 0912  01/22/14 0323 01/23/14 0310 01/24/14 0339 01/25/14 0633 01/26/14 0304  AST 15  --   --   --   --   --   --   ALT 9  --   --   --   --   --   --   ALKPHOS 69  --   --   --   --   --   --   BILITOT 0.2*  --   --   --   --   --   --   PROT 6.3  --   --   --   --   --   --   ALBUMIN 3.0*  < > 2.8* 2.7* 2.7* 2.7* 2.6*  < > = values in this interval not displayed. CBC:  Recent Labs Lab 01/21/14 0235 01/21/14 1300 01/22/14 0323 01/25/14 0630 01/26/14 0304  WBC 13.1* 15.4* 15.2* 16.6* 15.6*  HGB 7.5* 7.8* 7.9* 8.4* 8.1*  HCT 24.3* 25.2* 26.1* 26.4* 25.7*  MCV 92.4 92.3 93.9 92.6 91.8  PLT 220 212 213 227 230   CBG:  Recent Labs Lab 01/25/14 1213 01/25/14 1608 01/25/14 2152 01/26/14 0758 01/26/14 1130  GLUCAP 216* 207* 160* 154* 177*    Recent Results (from the past 240 hour(s))  MRSA PCR SCREENING     Status: None   Collection Time    01/19/14 11:38 AM      Result Value Ref Range Status   MRSA by PCR NEGATIVE  NEGATIVE Final   Comment:            The GeneXpert MRSA Assay (FDA     approved for NASAL specimens     only), is one component of a     comprehensive MRSA colonization     surveillance program. It is not     intended to diagnose MRSA     infection nor to guide or     monitor treatment for     MRSA infections.  CULTURE, BLOOD (ROUTINE X 2)     Status: None   Collection Time    01/20/14  8:47 AM      Result Value Ref Range Status   Specimen Description BLOOD RIGHT ARM   Final   Special Requests BOTTLES DRAWN AEROBIC AND ANAEROBIC 5MLS   Final   Culture  Setup Time     Final   Value: 01/20/2014 12:18     Performed at [facility]   Culture     Final   Value: NO GROWTH 5 DAYS     Performed at [facility]   Report Status 01/26/2014 FINAL   Final  CULTURE, BLOOD (ROUTINE X 2)     Status: None    Collection Time    01/20/14  9:02 AM      Result Value Ref Range Status   Specimen Description BLOOD HEMODIALYSIS CATHETER   Final   Special Requests BOTTLES DRAWN AEROBIC AND ANAEROBIC 5CC   Final   Culture  Setup Time     Final   Value: 01/20/2014 13:57     Performed at [facility]   Culture     Final   Value: NO GROWTH 5 DAYS     Performed at [facility]   Report Status 01/26/2014 FINAL   Final     Studies:  Recent x-ray studies have been reviewed in detail by the Attending Physician  Scheduled Meds:  Scheduled Meds: . B-complex with vitamin C  1 tablet Oral Daily  . cholecalciferol  2,000 Units Oral Daily  . darbepoetin (ARANESP) injection - DIALYSIS  100 mcg Intravenous Q Mon-HD  . heparin subcutaneous  5,000 Units Subcutaneous 3 times per day  . insulin aspart  0-15 Units Subcutaneous TID WC  . insulin aspart  0-5 Units Subcutaneous QHS  . insulin aspart  2 Units Subcutaneous TID WC  . insulin glargine  28 Units Subcutaneous QHS  . iron polysaccharides  150 mg Oral Daily  . labetalol  100 mg Oral BID  . omega-3 acid ethyl esters  1 g Oral Daily  . prazosin  10 mg Oral QHS  . sodium chloride  3 mL Intravenous Q12H  . vancomycin  1,000 mg Intravenous Once    Time spent on care of this patient: 35 mins   [place]  [phone] Pager - Text Page per Shea Evans as per below:  On-Call/Text Page:      [email or web address]      password [email or web address]  If 7PM-7AM, please contact night-coverage [email or web address] Password [facility] 01/26/2014, 3:34 PM   LOS: 11 days

## 2014-01-27 ENCOUNTER — Encounter (HOSPITAL_COMMUNITY): Payer: Medicare Other | Admitting: Anesthesiology

## 2014-01-27 ENCOUNTER — Inpatient Hospital Stay (HOSPITAL_COMMUNITY): Payer: Medicare Other | Admitting: Anesthesiology

## 2014-01-27 ENCOUNTER — Other Ambulatory Visit: Payer: Self-pay | Admitting: *Deleted

## 2014-01-27 ENCOUNTER — Encounter (HOSPITAL_COMMUNITY)
Admission: EM | Disposition: A | Discharge status: Skilled Nursing Facility | Payer: Self-pay | Source: Home / Self Care | Attending: Internal Medicine

## 2014-01-27 ENCOUNTER — Encounter (HOSPITAL_COMMUNITY): Payer: Self-pay | Admitting: Anesthesiology

## 2014-01-27 ENCOUNTER — Telehealth: Payer: Self-pay | Admitting: Vascular Surgery

## 2014-01-27 DIAGNOSIS — Z4931 Encounter for adequacy testing for hemodialysis: Secondary | ICD-10-CM

## 2014-01-27 DIAGNOSIS — N186 End stage renal disease: Secondary | ICD-10-CM

## 2014-01-27 HISTORY — PX: AV FISTULA PLACEMENT: SHX1204

## 2014-01-27 LAB — CBC
HCT: 25.5 % — ABNORMAL LOW (ref 36.0–46.0)
Hemoglobin: 8.1 g/dL — ABNORMAL LOW (ref 12.0–15.0)
MCH: 29.3 pg (ref 26.0–34.0)
MCHC: 31.8 g/dL (ref 30.0–36.0)
MCV: 92.4 fL (ref 78.0–100.0)
Platelets: 195 10*3/uL (ref 150–400)
RBC: 2.76 MIL/uL — ABNORMAL LOW (ref 3.87–5.11)
RDW: 16.5 % — AB (ref 11.5–15.5)
WBC: 15.1 10*3/uL — AB (ref 4.0–10.5)

## 2014-01-27 LAB — GLUCOSE, CAPILLARY
GLUCOSE-CAPILLARY: 119 mg/dL — AB (ref 70–99)
GLUCOSE-CAPILLARY: 128 mg/dL — AB (ref 70–99)
GLUCOSE-CAPILLARY: 146 mg/dL — AB (ref 70–99)
GLUCOSE-CAPILLARY: 169 mg/dL — AB (ref 70–99)
Glucose-Capillary: 171 mg/dL — ABNORMAL HIGH (ref 70–99)

## 2014-01-27 LAB — RENAL FUNCTION PANEL
ALBUMIN: 2.5 g/dL — AB (ref 3.5–5.2)
BUN: 23 mg/dL (ref 6–23)
CO2: 28 mEq/L (ref 19–32)
Calcium: 8.2 mg/dL — ABNORMAL LOW (ref 8.4–10.5)
Chloride: 98 mEq/L (ref 96–112)
Creatinine, Ser: 2.45 mg/dL — ABNORMAL HIGH (ref 0.50–1.10)
GFR calc Af Amer: 22 mL/min — ABNORMAL LOW (ref >90)
GFR calc non Af Amer: 19 mL/min — ABNORMAL LOW (ref >90)
GLUCOSE: 183 mg/dL — AB (ref 70–99)
PHOSPHORUS: 3.3 mg/dL (ref 2.3–4.6)
POTASSIUM: 4 meq/L (ref 3.7–5.3)
Sodium: 137 mEq/L (ref 137–147)

## 2014-01-27 LAB — URINE CULTURE

## 2014-01-27 LAB — PROTIME-INR
INR: 1.07 (ref 0.00–1.49)
PROTHROMBIN TIME: 13.7 s (ref 11.6–15.2)

## 2014-01-27 SURGERY — ARTERIOVENOUS (AV) FISTULA CREATION
Anesthesia: Monitor Anesthesia Care | Laterality: Left

## 2014-01-27 MED ORDER — FENTANYL CITRATE 0.05 MG/ML IJ SOLN
INTRAMUSCULAR | Status: AC
  Filled 2014-01-27: qty 5

## 2014-01-27 MED ORDER — PROPOFOL 10 MG/ML IV BOLUS
INTRAVENOUS | Status: DC | PRN
  Administered 2014-01-27 (×6): 10 mg via INTRAVENOUS

## 2014-01-27 MED ORDER — PROMETHAZINE HCL 25 MG/ML IJ SOLN: 6.2500 mg | INTRAMUSCULAR | Status: DC | PRN

## 2014-01-27 MED ORDER — FENTANYL CITRATE 0.05 MG/ML IJ SOLN
INTRAMUSCULAR | Status: DC | PRN
  Administered 2014-01-27: 50 ug via INTRAVENOUS

## 2014-01-27 MED ORDER — HYDROMORPHONE HCL PF 1 MG/ML IJ SOLN: 0.2500 mg | INTRAMUSCULAR | Status: DC | PRN

## 2014-01-27 MED ORDER — OXYCODONE HCL 5 MG PO TABS: 5.0000 mg | ORAL_TABLET | Freq: Once | ORAL | Status: DC | PRN

## 2014-01-27 MED ORDER — LABETALOL HCL 200 MG PO TABS
200.0000 mg | ORAL_TABLET | Freq: Two times a day (BID) | ORAL | Status: DC
  Administered 2014-01-27 – 2014-01-28 (×2): 200 mg via ORAL
  Filled 2014-01-27 (×3): qty 1

## 2014-01-27 MED ORDER — SODIUM CHLORIDE 0.9 % IR SOLN
Status: DC | PRN
  Administered 2014-01-27: 07:00:00

## 2014-01-27 MED ORDER — MIDAZOLAM HCL 2 MG/2ML IJ SOLN
INTRAMUSCULAR | Status: AC
  Filled 2014-01-27: qty 2

## 2014-01-27 MED ORDER — FENTANYL CITRATE 0.05 MG/ML IJ SOLN: 25.0000 ug | INTRAMUSCULAR | Status: DC | PRN

## 2014-01-27 MED ORDER — DOCUSATE SODIUM 100 MG PO CAPS
200.0000 mg | ORAL_CAPSULE | Freq: Two times a day (BID) | ORAL | Status: DC
  Administered 2014-01-27 – 2014-01-28 (×3): 200 mg via ORAL
  Filled 2014-01-27 (×4): qty 2

## 2014-01-27 MED ORDER — OXYCODONE HCL 5 MG/5ML PO SOLN: 5.0000 mg | Freq: Once | ORAL | Status: DC | PRN

## 2014-01-27 MED ORDER — PROPOFOL 10 MG/ML IV BOLUS
INTRAVENOUS | Status: AC
  Filled 2014-01-27: qty 20

## 2014-01-27 MED ORDER — SODIUM CHLORIDE 0.9 % IV SOLN
INTRAVENOUS | Status: DC | PRN
  Administered 2014-01-27: 07:00:00 via INTRAVENOUS

## 2014-01-27 MED ORDER — 0.9 % SODIUM CHLORIDE (POUR BTL) OPTIME
TOPICAL | Status: DC | PRN
  Administered 2014-01-27: 1000 mL

## 2014-01-27 MED ORDER — LIDOCAINE-EPINEPHRINE (PF) 1 %-1:200000 IJ SOLN
INTRAMUSCULAR | Status: DC | PRN
  Administered 2014-01-27: 30 mL

## 2014-01-27 MED ORDER — BISACODYL 5 MG PO TBEC
10.0000 mg | DELAYED_RELEASE_TABLET | Freq: Once | ORAL | Status: AC
  Administered 2014-01-27: 10 mg via ORAL
  Filled 2014-01-27: qty 2

## 2014-01-27 MED ORDER — LIDOCAINE HCL (CARDIAC) 20 MG/ML IV SOLN
INTRAVENOUS | Status: AC
  Filled 2014-01-27: qty 5

## 2014-01-27 MED ORDER — MIDAZOLAM HCL 5 MG/5ML IJ SOLN
INTRAMUSCULAR | Status: DC | PRN
  Administered 2014-01-27: 2 mg via INTRAVENOUS

## 2014-01-27 SURGICAL SUPPLY — 37 items
ADH SKN CLS APL DERMABOND .7 (GAUZE/BANDAGES/DRESSINGS) ×1
ADH SKN CLS LQ APL DERMABOND (GAUZE/BANDAGES/DRESSINGS) ×1
ARMBAND PINK RESTRICT EXTREMIT (MISCELLANEOUS) ×2 IMPLANT
CANISTER SUCTION 2500CC (MISCELLANEOUS) ×2 IMPLANT
CLIP TI MEDIUM 6 (CLIP) ×2 IMPLANT
CLIP TI WIDE RED SMALL 6 (CLIP) ×2 IMPLANT
COVER PROBE W GEL 5X96 (DRAPES) ×1 IMPLANT
COVER SURGICAL LIGHT HANDLE (MISCELLANEOUS) ×2 IMPLANT
DERMABOND ADHESIVE PROPEN (GAUZE/BANDAGES/DRESSINGS) ×1
DERMABOND ADVANCED (GAUZE/BANDAGES/DRESSINGS) ×1
DERMABOND ADVANCED .7 DNX12 (GAUZE/BANDAGES/DRESSINGS) ×1 IMPLANT
DERMABOND ADVANCED .7 DNX6 (GAUZE/BANDAGES/DRESSINGS) IMPLANT
DRAIN PENROSE 1X12 LTX STRL (DRAIN) ×1 IMPLANT
ELECT REM PT RETURN 9FT ADLT (ELECTROSURGICAL) ×2
ELECTRODE REM PT RTRN 9FT ADLT (ELECTROSURGICAL) ×1 IMPLANT
GEL ULTRASOUND 20GR AQUASONIC (MISCELLANEOUS) IMPLANT
GLOVE BIOGEL PI IND STRL 7.5 (GLOVE) IMPLANT
GLOVE BIOGEL PI INDICATOR 7.5 (GLOVE) ×1
GLOVE ECLIPSE 7.5 STRL STRAW (GLOVE) ×1 IMPLANT
GLOVE SS BIOGEL STRL SZ 7 (GLOVE) ×1 IMPLANT
GLOVE SUPERSENSE BIOGEL SZ 7 (GLOVE) ×2
GOWN STRL REUS W/ TWL LRG LVL3 (GOWN DISPOSABLE) ×3 IMPLANT
GOWN STRL REUS W/ TWL XL LVL3 (GOWN DISPOSABLE) IMPLANT
GOWN STRL REUS W/TWL LRG LVL3 (GOWN DISPOSABLE) ×6
GOWN STRL REUS W/TWL XL LVL3 (GOWN DISPOSABLE) ×2
KIT BASIN OR (CUSTOM PROCEDURE TRAY) ×2 IMPLANT
KIT ROOM TURNOVER OR (KITS) ×2 IMPLANT
NS IRRIG 1000ML POUR BTL (IV SOLUTION) ×2 IMPLANT
PACK CV ACCESS (CUSTOM PROCEDURE TRAY) ×2 IMPLANT
PAD ARMBOARD 7.5X6 YLW CONV (MISCELLANEOUS) ×4 IMPLANT
SUT PROLENE 6 0 BV (SUTURE) ×2 IMPLANT
SUT VIC AB 3-0 SH 27 (SUTURE) ×2
SUT VIC AB 3-0 SH 27X BRD (SUTURE) ×1 IMPLANT
TOWEL OR 17X24 6PK STRL BLUE (TOWEL DISPOSABLE) ×2 IMPLANT
TOWEL OR 17X26 10 PK STRL BLUE (TOWEL DISPOSABLE) ×2 IMPLANT
UNDERPAD 30X30 INCONTINENT (UNDERPADS AND DIAPERS) ×2 IMPLANT
WATER STERILE IRR 1000ML POUR (IV SOLUTION) ×2 IMPLANT

## 2014-01-27 NOTE — Interval H&P Note (Signed)
History and Physical Interval Note:  01/27/2014 7:31 AM  Jacqueline [name]  has presented today for surgery, with the diagnosis of ESRD  The various methods of treatment have been discussed with the patient and family. After consideration of risks, benefits and other options for treatment, the patient has consented to  Procedure(s): ARTERIOVENOUS (AV) FISTULA CREATION (Left) as a surgical intervention .  The patient's history has been reviewed, patient examined, no change in status, stable for surgery.  I have reviewed the patient's chart and labs.  Questions were answered to the patient's satisfaction.     Tinnie Gens

## 2014-01-27 NOTE — Transfer of Care (Signed)
Immediate Anesthesia Transfer of Care Note  Patient: Jacqueline [name]  Procedure(s) Performed: Procedure(s): ARTERIOVENOUS (AV) FISTULA CREATION (Left)  Patient Location: PACU  Anesthesia Type:MAC  Level of Consciousness: awake, alert  and oriented  Airway & Oxygen Therapy: Patient Spontanous Breathing and Patient connected to nasal cannula oxygen  Post-op Assessment: Report given to PACU RN, Post -op Vital signs reviewed and stable and Patient moving all extremities X 4  Post vital signs: Reviewed and stable  Complications: No apparent anesthesia complications

## 2014-01-27 NOTE — Op Note (Signed)
OPERATIVE REPORT  Date of Surgery: 01/15/2014 - 01/27/2014  Surgeon: Tinnie Gens, MD  Assistant: Gerri Lins PA  Pre-op Diagnosis: ESRD  Post-op Diagnosis: end stage renal disease  Procedure: Procedure(s): ARTERIOVENOUS (AV) FISTULA CREATION-left-brachial-cephalic Anesthesia: MAC  EBL: Minimal  Complications: None  Procedure Details: Especially to the operating room placed in supine position at which time the left upper extremity was prepped Betadine scrub and solution draped in routine sterile manner after infiltration with 1% Xylocaine with epinephrine transverse incision was made antecubital area antecubital vein dissected free. The cephalic branch was excellent being about 4 mm in size. Brachial artery was exposed and the fascia encircled with Vesseloops. An excellent pulse. Cephalic vein was then ligated distally preserving the basilic branch transected gently dilated with heparinized saline. Artery was then occluded proximally and distally with Vesseloops a 15 blade and extended with Potts scissors. Cephalic vein was slightly spatulated and anastomosed end to side with 6-0 Prolene. Vesseloops released there was good pulse and palpable thrill in the fistula and audible flow in the proximal upper arm. Radial artery and ulnar artery flow slightly diminished with the fistula open and improved with compression of the fistula. Adequate hemostasis was achieved wound closed in layers with Vicryl subcuticular fashion and Dermabond patient to the recovery room in stable condition   Tinnie Gens, MD 01/27/2014 8:45 AM

## 2014-01-27 NOTE — Progress Notes (Addendum)
Jacqueline [name] TEAM 1 - Stepdown/ICU TEAM Progress Note  Jacqueline [name] [id number] DOB: 08/25/1946 DOA: 01/15/2014 PCP: Salena Saner., MD  Admit HPI / Brief Narrative: 68 y.o. female with a history of HTN, DM2, Hyperlipidemia and CKD, who presented to the ED 2/14 with complaints of worsening SOB, and swelling of both of her legs over the past month. In the ED she was found to be in volume overload.  After she was admitted a diagnosis of AKI on CKD was made, and it became clear she would need HD for volume removal  The pt was taken to the OR on 2/18 for placement of a R IJ HD catheter.  After the OR procedue she had to be taken to the ICU because she became apneic during the vascular procedure, requiring full intubation.   SIGNIFICANT EVENTS / STUDIES:  2/18 R IJ tunneled HD cath  2/18 intubated briefly after surgery  HPI/Subjective: Has just had fistula placed- no c/o pain.   Assessment/Plan:  Acute on chronic respiratory failure  Multifactorial with primary issues being OHS/OSA as well as volume overload in the setting of end-stage renal disease - see below   Suspected OSA / OHS  Auto-set BiPAP QHS - will need f/u with a sleep MD as outpatient for a formal sleep study - Pulm to arrange - prognosis is quite poor unless pt decides to comply w/ BIPAP use - cont to educate - pt has been slowly more compliant with BiPAP therapy  Progressive CKD due to DM, HTN, CHF > now ESRD Per Nephrology - HD has been initiated - failed trial of diuretic - per Neph will need permanent HD access prior to d/c - for left forearm or upper arm AV fistula 2/26 - clearly is high risk for same and agree doing while inpatient is safest option as pt will require intubation and likely prolonged vent support in peri-operative period  Right basilar healthcare acquired pneumonia Continue empiric antibiotic for planned 7 day course  UTI UA c/w UTI - unfortunately culture growing multiple morphotypes- recheck  UAA  Leukocytosis  - not resolving repeat UA  Acute on chronic CHF, diastolic w/ Mild pulmonary edema  Volume now being managed by HD - counseling on diet - fluid and Na restrict   L ankle pain Improved - Xrays w/o boney findings - uric acid normal - follow -   HTN Increase labetalol to 200 BID  DM  CBG improving but not yet at goal - adjust treatment again today and follow trend  Morbid obesity - Body mass index is 50.11 kg/(m^2). Nutrition consult   Anemia of chronic kidney disease  Fe and epo per Nephrology - CBC appears to be rising  Code Status: FULL Family Communication: no family present at time of exam Disposition Plan: SDU (new start BIPAP)- SNF placement  Consultants: [facility] Nephrology PCCM  Antibiotics: Cefepime 2/24>> Vancomycin 2/24>>  DVT prophylaxis: SQ heparin   Objective: Blood pressure 125/25, pulse 65, temperature 98.7 F (37.1 C), temperature source Oral, resp. rate 16, height 5\' 5"  (1.651 m), weight 136.6 kg (301 lb 2.4 oz), SpO2 98.00%.  Intake/Output Summary (Last 24 hours) at 01/27/14 1634 Last data filed at 01/27/14 1400  Gross per 24 hour  Intake    590 ml  Output   2576 ml  Net  -1986 ml   Exam: General: No acute respiratory distress - alert and conversant  Lungs: Poor air movement/distant breath sounds throughout all fields with no appreciable wheezes Cardiovascular: Distant  heart sounds - regular rate and rhythm Abdomen: Morbidly obese, nontender, nondistended, soft, bowel sounds positive, no rebound, no ascites, no appreciable mass Extremities: No significant cyanosis, or clubbing; no significant appreciable joint abnormalities or edema  Data Reviewed: Basic Metabolic Panel:  Recent Labs Lab 01/23/14 0310 01/24/14 0339 01/25/14 0633 01/26/14 0304 01/27/14 0240  NA 136* 138 139 135*  134* 137  K 4.0 4.1 3.8 3.8  3.8 4.0  CL 96 98 97 95*  94* 98  CO2 25 27 28 26  25 28   GLUCOSE 179* 158* 167* 167*  170* 183*  BUN  46* 61* 29* 44*  45* 23  CREATININE 3.78* 3.89* 2.67* 3.37*  3.37* 2.45*  CALCIUM 8.6 8.5 8.4 8.4  8.5 8.2*  PHOS 4.0 4.6 3.5 4.3 3.3   Liver Function Tests:  Recent Labs Lab 01/23/14 0310 01/24/14 0339 01/25/14 0633 01/26/14 0304 01/27/14 0240  ALBUMIN 2.7* 2.7* 2.7* 2.6* 2.5*   CBC:  Recent Labs Lab 01/21/14 1300 01/22/14 0323 01/25/14 0630 01/26/14 0304 01/27/14 0240  WBC 15.4* 15.2* 16.6* 15.6* 15.1*  HGB 7.8* 7.9* 8.4* 8.1* 8.1*  HCT 25.2* 26.1* 26.4* 25.7* 25.5*  MCV 92.3 93.9 92.6 91.8 92.4  PLT 212 213 227 230 195   CBG:  Recent Labs Lab 01/27/14 0651 01/27/14 0850 01/27/14 1043 01/27/14 1217 01/27/14 1619  GLUCAP 119* 128* 146* 169* 171*    Recent Results (from the past 240 hour(s))  MRSA PCR SCREENING     Status: None   Collection Time    01/19/14 11:38 AM      Result Value Ref Range Status   MRSA by PCR NEGATIVE  NEGATIVE Final   Comment:            The GeneXpert MRSA Assay (FDA     approved for NASAL specimens     only), is one component of a     comprehensive MRSA colonization     surveillance program. It is not     intended to diagnose MRSA     infection nor to guide or     monitor treatment for     MRSA infections.  CULTURE, BLOOD (ROUTINE X 2)     Status: None   Collection Time    01/20/14  8:47 AM      Result Value Ref Range Status   Specimen Description BLOOD RIGHT ARM   Final   Special Requests BOTTLES DRAWN AEROBIC AND ANAEROBIC 5MLS   Final   Culture  Setup Time     Final   Value: 01/20/2014 12:18     Performed at [facility]   Culture     Final   Value: NO GROWTH 5 DAYS     Performed at [facility]   Report Status 01/26/2014 FINAL   Final  CULTURE, BLOOD (ROUTINE X 2)     Status: None   Collection Time    01/20/14  9:02 AM      Result Value Ref Range Status   Specimen Description BLOOD HEMODIALYSIS CATHETER   Final   Special Requests BOTTLES DRAWN AEROBIC AND ANAEROBIC 5CC   Final   Culture  Setup  Time     Final   Value: 01/20/2014 13:57     Performed at [facility]   Culture     Final   Value: NO GROWTH 5 DAYS     Performed at [facility]   Report Status 01/26/2014 FINAL   Final  URINE CULTURE     Status: None   Collection Time    01/25/14  7:03 PM      Result Value Ref Range Status   Specimen Description URINE, CLEAN CATCH   Final   Special Requests NONE   Final   Culture  Setup Time     Final   Value: 01/25/2014 20:18     Performed at [place]     Final   Value: 50,000 COLONIES/ML     Performed at [facility]   Culture     Final   Value: Multiple bacterial morphotypes present, none predominant. Suggest appropriate recollection if clinically indicated.     Performed at [facility]   Report Status 01/27/2014 FINAL   Final     Studies:  Recent x-ray studies have been reviewed in detail by the Attending Physician  Scheduled Meds:  Scheduled Meds: . B-complex with vitamin C  1 tablet Oral Daily  . [START ON 01/28/2014] ceFEPime (MAXIPIME) IV  2 g Intravenous Q M,W,F-HD  . cholecalciferol  2,000 Units Oral Daily  . darbepoetin (ARANESP) injection - DIALYSIS  100 mcg Intravenous Q Mon-HD  . docusate sodium  200 mg Oral BID  . heparin subcutaneous  5,000 Units Subcutaneous 3 times per day  . insulin aspart  0-15 Units Subcutaneous TID WC  . insulin aspart  0-5 Units Subcutaneous QHS  . insulin aspart  4 Units Subcutaneous TID WC  . insulin glargine  28 Units Subcutaneous QHS  . iron polysaccharides  150 mg Oral Daily  . labetalol  200 mg Oral BID  . omega-3 acid ethyl esters  1 g Oral Daily  . prazosin  10 mg Oral QHS  . sodium chloride  3 mL Intravenous Q12H  . [START ON 01/28/2014] vancomycin  1,000 mg Intravenous Q M,W,F-HD    Time spent on care of this patient: 46 mins   Debbe Odea , MD  [facility] Office  [phone] Pager - Text Page per Shea Evans as per below:  On-Call/Text Page:       [email or web address]      password [email or web address]  If 7PM-7AM, please contact night-coverage [email or web address] Password [facility] 01/27/2014, 4:34 PM   LOS: 12 days

## 2014-01-27 NOTE — Anesthesia Procedure Notes (Signed)
Procedure Name: MAC Date/Time: 01/27/2014 7:43 AM Performed by: Neldon Newport Pre-anesthesia Checklist: Patient identified, Emergency Drugs available, Suction available, Patient being monitored and Timeout performed Patient Re-evaluated:Patient Re-evaluated prior to inductionOxygen Delivery Method: Simple face mask Placement Confirmation: positive ETCO2

## 2014-01-27 NOTE — Anesthesia Preprocedure Evaluation (Addendum)
Anesthesia Evaluation  Patient identified by MRN, date of birth, ID band Patient awake    Reviewed: Allergy & Precautions, H&P , NPO status , Patient's Chart, lab work & pertinent test results  Airway Mallampati: II      Dental  (+) Poor Dentition, Edentulous Upper, Chipped,    Pulmonary sleep apnea ,    + decreased breath sounds      Cardiovascular hypertension, Pt. on home beta blockers +CHF Rhythm:Regular Rate:Normal     Neuro/Psych PSYCHIATRIC DISORDERS    GI/Hepatic   Endo/Other  diabetes  Renal/GU CRF and ESRFRenal disease     Musculoskeletal   Abdominal (+) + obese,   Peds  Hematology  (+) anemia ,   Anesthesia Other Findings   Reproductive/Obstetrics                          Anesthesia Physical Anesthesia Plan  ASA: III  Anesthesia Plan: MAC   Post-op Pain Management:    Induction:   Airway Management Planned: Natural Airway and Simple Face Mask  Additional Equipment:   Intra-op Plan:   Post-operative Plan:   Informed Consent: I have reviewed the patients History and Physical, chart, labs and discussed the procedure including the risks, benefits and alternatives for the proposed anesthesia with the patient or authorized representative who has indicated his/her understanding and acceptance.   Dental Advisory Given  Plan Discussed with: CRNA, Surgeon and Anesthesiologist  Anesthesia Plan Comments:        Anesthesia Quick Evaluation

## 2014-01-27 NOTE — Anesthesia Postprocedure Evaluation (Signed)
  Anesthesia Post-op Note  Patient: Jacqueline [name]  Procedure(s) Performed: Procedure(s): ARTERIOVENOUS (AV) FISTULA CREATION (Left)  Patient Location: PACU  Anesthesia Type:MAC  Level of Consciousness: awake  Airway and Oxygen Therapy: Patient Spontanous Breathing  Post-op Pain: none  Post-op Assessment: Post-op Vital signs reviewed  Post-op Vital Signs: stable  Complications: No apparent anesthesia complications

## 2014-01-27 NOTE — Progress Notes (Signed)
[facility] ROUNDING NOTE   Subjective:   Interval History: no complaints status post fistula placemant  Objective:  Vital signs in last 24 hours:  Temp:  [97.2 F (36.2 C)-98.5 F (36.9 C)] 97.2 F (36.2 C) (02/26 0945) Pulse Rate:  [53-83] 61 (02/26 0950) Resp:  [17-25] 22 (02/26 0950) BP: (130-181)/(38-154) 168/154 mmHg (02/26 0950) SpO2:  [92 %-100 %] 100 % (02/26 1015) Weight:  [136.6 kg (301 lb 2.4 oz)-138.8 kg (306 lb)] 136.6 kg (301 lb 2.4 oz) (02/25 1946)  Weight change: 1.496 kg (3 lb 4.8 oz) Filed Weights   01/26/14 0444 01/26/14 1607 01/26/14 1946  Weight: 137.304 kg (302 lb 11.2 oz) 138.8 kg (306 lb) 136.6 kg (301 lb 2.4 oz)    Intake/Output: I/O last 3 completed shifts: In: 170 [P.O.:120; IV Piggyback:50] Out: 2576 [Urine:75; Other:2500; Stool:1]   Intake/Output this shift:  Total I/O In: 320 [I.V.:320] Out: -   CVS- RRR RS- CTA ABD- BS present soft non-distended EXT- no edema   Basic Metabolic Panel:  Recent Labs Lab 01/23/14 0310 01/24/14 0339 01/25/14 0633 01/26/14 0304 01/27/14 0240  NA 136* 138 139 135*  134* 137  K 4.0 4.1 3.8 3.8  3.8 4.0  CL 96 98 97 95*  94* 98  CO2 25 27 28 26  25 28   GLUCOSE 179* 158* 167* 167*  170* 183*  BUN 46* 61* 29* 44*  45* 23  CREATININE 3.78* 3.89* 2.67* 3.37*  3.37* 2.45*  CALCIUM 8.6 8.5 8.4 8.4  8.5 8.2*  PHOS 4.0 4.6 3.5 4.3 3.3    Liver Function Tests:  Recent Labs Lab 01/23/14 0310 01/24/14 0339 01/25/14 0633 01/26/14 0304 01/27/14 0240  ALBUMIN 2.7* 2.7* 2.7* 2.6* 2.5*   No results found for this basename: LIPASE, AMYLASE,  in the last 168 hours No results found for this basename: AMMONIA,  in the last 168 hours  CBC:  Recent Labs Lab 01/21/14 1300 01/22/14 0323 01/25/14 0630 01/26/14 0304 01/27/14 0240  WBC 15.4* 15.2* 16.6* 15.6* 15.1*  HGB 7.8* 7.9* 8.4* 8.1* 8.1*  HCT 25.2* 26.1* 26.4* 25.7* 25.5*  MCV 92.3 93.9 92.6 91.8 92.4  PLT 212 213 227  230 195    Cardiac Enzymes: No results found for this basename: CKTOTAL, CKMB, CKMBINDEX, TROPONINI,  in the last 168 hours  BNP: No components found with this basename: POCBNP,   CBG:  Recent Labs Lab 01/26/14 1130 01/26/14 2122 01/27/14 0651 01/27/14 0850 01/27/14 1043  GLUCAP 177* 121* 119* 128* 146*    Microbiology: Results for orders placed during the hospital encounter of 01/15/14  MRSA PCR SCREENING     Status: None   Collection Time    01/19/14 11:38 AM      Result Value Ref Range Status   MRSA by PCR NEGATIVE  NEGATIVE Final   Comment:            The GeneXpert MRSA Assay (FDA     approved for NASAL specimens     only), is one component of a     comprehensive MRSA colonization     surveillance program. It is not     intended to diagnose MRSA     infection nor to guide or     monitor treatment for     MRSA infections.  CULTURE, BLOOD (ROUTINE X 2)     Status: None   Collection Time    01/20/14  8:47 AM      Result Value Ref  Range Status   Specimen Description BLOOD RIGHT ARM   Final   Special Requests BOTTLES DRAWN AEROBIC AND ANAEROBIC 5MLS   Final   Culture  Setup Time     Final   Value: 01/20/2014 12:18     Performed at [facility]   Culture     Final   Value: NO GROWTH 5 DAYS     Performed at [facility]   Report Status 01/26/2014 FINAL   Final  CULTURE, BLOOD (ROUTINE X 2)     Status: None   Collection Time    01/20/14  9:02 AM      Result Value Ref Range Status   Specimen Description BLOOD HEMODIALYSIS CATHETER   Final   Special Requests BOTTLES DRAWN AEROBIC AND ANAEROBIC 5CC   Final   Culture  Setup Time     Final   Value: 01/20/2014 13:57     Performed at [facility]   Culture     Final   Value: NO GROWTH 5 DAYS     Performed at [facility]   Report Status 01/26/2014 FINAL   Final  URINE CULTURE     Status: None   Collection Time    01/25/14  7:03 PM      Result Value Ref Range Status   Specimen  Description URINE, CLEAN CATCH   Final   Special Requests NONE   Final   Culture  Setup Time     Final   Value: 01/25/2014 20:18     Performed at [place]     Final   Value: 50,000 COLONIES/ML     Performed at [facility]   Culture     Final   Value: Multiple bacterial morphotypes present, none predominant. Suggest appropriate recollection if clinically indicated.     Performed at [facility]   Report Status 01/27/2014 FINAL   Final    Coagulation Studies:  Recent Labs  01/27/14 0240  LABPROT 13.7  INR 1.07    Urinalysis:  Recent Labs  01/25/14 1903  COLORURINE YELLOW  LABSPEC 1.020  PHURINE 5.0  GLUCOSEU NEGATIVE  HGBUR LARGE*  BILIRUBINUR SMALL*  KETONESUR NEGATIVE  PROTEINUR 100*  UROBILINOGEN 1.0  NITRITE NEGATIVE  LEUKOCYTESUR LARGE*      Imaging: Dg Ankle Complete Left  01/25/2014   CLINICAL DATA:  Ankle pain and edema.  Tenderness.  EXAM: LEFT ANKLE COMPLETE - 3+ VIEW  COMPARISON:  DG ANKLE COMPLETE*L* dated 06/01/2005  FINDINGS: Soft tissue swelling is present in the leg and ankle. The ankle mortise is congruent. The talar dome appears intact. There is no fracture. Diabetic type small vessel atherosclerosis calcification is present. Mild midfoot osteoarthritis. Anatomic alignment. First MTP joint osteoarthritis incidentally noted.  IMPRESSION: Soft tissue swelling in the leg and ankle.  No osseous injury.   Electronically Signed   By: Dereck Ligas M.D.   On: 01/25/2014 14:34   Dg Chest Port 1 View  01/25/2014   CLINICAL DATA:  Cough, leukocytosis  EXAM: PORTABLE CHEST - 1 VIEW  COMPARISON:  January 19, 2014  FINDINGS: The heart size and mediastinal contours are stable. Heart size is enlarged. The aorta is tortuous. Right central venous line is unchanged. There is pulmonary edema. Patchy consolidation of the right lung base is noted. Evaluation of left lung base is limited due to technique. The visualized skeletal  structures are stable.  IMPRESSION: Pulmonary edema.  Patchy pneumonia of  medial right lung base.   Electronically Signed   By: Abelardo Diesel M.D.   On: 01/25/2014 14:37     Medications:     . B-complex with vitamin C  1 tablet Oral Daily  . [START ON 01/28/2014] ceFEPime (MAXIPIME) IV  2 g Intravenous Q M,W,F-HD  . cholecalciferol  2,000 Units Oral Daily  . darbepoetin (ARANESP) injection - DIALYSIS  100 mcg Intravenous Q Mon-HD  . heparin subcutaneous  5,000 Units Subcutaneous 3 times per day  . insulin aspart  0-15 Units Subcutaneous TID WC  . insulin aspart  0-5 Units Subcutaneous QHS  . insulin aspart  4 Units Subcutaneous TID WC  . insulin glargine  28 Units Subcutaneous QHS  . iron polysaccharides  150 mg Oral Daily  . labetalol  200 mg Oral BID  . omega-3 acid ethyl esters  1 g Oral Daily  . prazosin  10 mg Oral QHS  . sodium chloride  3 mL Intravenous Q12H  . [START ON 01/28/2014] vancomycin  1,000 mg Intravenous Q M,W,F-HD   sodium chloride, sodium chloride, sodium chloride, acetaminophen, albuterol, feeding supplement (NEPRO CARB STEADY), heparin, heparin, heparin, labetalol, lidocaine (PF), lidocaine-prilocaine, ondansetron (ZOFRAN) IV, pentafluoroprop-tetrafluoroeth, sodium chloride  Assessment/ Plan:  68 y.o. female with a history of HTN, DM2, Hyperlipidemia and CKD, who presented to the ED 2/14 with complaints of worsening SOB, and swelling of both of her legs over the past month. In the ED she was found to be in volume overload. After she was admitted a diagnosis of AKI on CKD was made, and it became clear she would need HD for volume removal The pt was taken to the OR on 2/18 for placement of a R IJ HD catheter. 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  HTN/volume controlled with diaysis  Anemia repleted with Iron  Bones no vitamin d  Fistula placed Plan dialysis tomorrow    LOS: 12 Schawn Byas W @TODAY @11 :54 AM

## 2014-01-27 NOTE — Telephone Encounter (Addendum)
Message copied by Gena Fray on Thu Jan 27, 2014  1:08 PM ------      Message from: Peter Minium K      Created: Thu Jan 27, 2014  9:34 AM      Regarding: Schedule                   ----- Message -----         From: Ulyses Amor, PA-C         Sent: 01/27/2014   8:39 AM           To: Vvs Charge Pool            F/I in 6 weeks need fistula ultrasound.  Dr. Kellie Simmering ------  01/27/14: left detailed message of appt and wait in between lab and MD appointment, dpm

## 2014-01-27 NOTE — H&P (View-Only) (Signed)
[facility]  HPI: 68 y/o female was first consulted on 01/18/2014 for Diatek and AV fistula or AV graft.  She was taken to the OR and Dr. Donnetta Hutching placed a Right IJ hemodialysis catheter.  The patient went into respiratory arrest intraoperatively, so we re-scheduled the access for a later date.  Dr. Donnetta Hutching spoke with the patient on 01/21/2014 and she decided that she did not want to proceed with permanent access at this time.  We have been re consulted for permament access today.  The patient agrees to have the access surgery.    Subjective  - She was scared to be put to sleep for more surgery.  Objective 165/62 67 98.8 F (37.1 C) (Oral) 27 100%  Intake/Output Summary (Last 24 hours) at 01/25/14 1154 Last data filed at 01/24/14 1700  Gross per 24 hour  Intake    120 ml  Output   3300 ml  Net  -3180 ml   PE: Palpable radial pulses bilateral, sensation grossly intact and equal. Lungs: distant breath sounds, no wheezing, no acute distress on RA Heart: RRR  Assessment/Planning:  Left upper arm fistula creation. I offered this for this coming Thursday 01/27/2014 with Dr. Kellie Simmering. Laurence Slate [facility] 01/25/2014 11:54 AM  Agree with above. Her previous vein map shows a reasonable left upper arm cephalic vein. She has triphasic signals in the left wrist and a palpable left radial pulse. She appears to be a good candidate for a left forearm or upper arm AV fistula. This has been scheduled for Thursday by Dr. Kellie Simmering. I have discussed the procedure potential complications with the patient today and she is agreeable to proceed. She has a functioning tunneled dialysis catheter.  Deitra Mayo, MD, FACS Beeper [phone] 01/25/2014

## 2014-01-27 NOTE — Preoperative (Signed)
Beta Blockers   Reason not to administer Beta Blockers:Not Applicable 

## 2014-01-27 NOTE — Progress Notes (Signed)
Back from the PACU awake and alert. Left arm warm and dry, Left AV fistula + bruit and thrill.

## 2014-01-28 ENCOUNTER — Encounter (HOSPITAL_COMMUNITY): Payer: Self-pay | Admitting: Vascular Surgery

## 2014-01-28 DIAGNOSIS — N2581 Secondary hyperparathyroidism of renal origin: Secondary | ICD-10-CM

## 2014-01-28 LAB — CBC
HEMATOCRIT: 26.1 % — AB (ref 36.0–46.0)
Hemoglobin: 7.9 g/dL — ABNORMAL LOW (ref 12.0–15.0)
MCH: 28.4 pg (ref 26.0–34.0)
MCHC: 30.3 g/dL (ref 30.0–36.0)
MCV: 93.9 fL (ref 78.0–100.0)
PLATELETS: 213 10*3/uL (ref 150–400)
RBC: 2.78 MIL/uL — ABNORMAL LOW (ref 3.87–5.11)
RDW: 16.6 % — AB (ref 11.5–15.5)
WBC: 12.1 10*3/uL — ABNORMAL HIGH (ref 4.0–10.5)

## 2014-01-28 LAB — RENAL FUNCTION PANEL
ALBUMIN: 2.5 g/dL — AB (ref 3.5–5.2)
BUN: 36 mg/dL — AB (ref 6–23)
CALCIUM: 8.7 mg/dL (ref 8.4–10.5)
CO2: 27 mEq/L (ref 19–32)
CREATININE: 3.91 mg/dL — AB (ref 0.50–1.10)
Chloride: 97 mEq/L (ref 96–112)
GFR calc Af Amer: 12 mL/min — ABNORMAL LOW (ref >90)
GFR calc non Af Amer: 11 mL/min — ABNORMAL LOW (ref >90)
GLUCOSE: 109 mg/dL — AB (ref 70–99)
PHOSPHORUS: 5.2 mg/dL — AB (ref 2.3–4.6)
Potassium: 4.3 mEq/L (ref 3.7–5.3)
Sodium: 137 mEq/L (ref 137–147)

## 2014-01-28 LAB — GLUCOSE, CAPILLARY
GLUCOSE-CAPILLARY: 182 mg/dL — AB (ref 70–99)
Glucose-Capillary: 158 mg/dL — ABNORMAL HIGH (ref 70–99)
Glucose-Capillary: 135 mg/dL — ABNORMAL HIGH (ref 70–99)

## 2014-01-28 MED ORDER — LABETALOL HCL 200 MG PO TABS: 200.0000 mg | ORAL_TABLET | Freq: Two times a day (BID) | ORAL | Status: DC

## 2014-01-28 MED ORDER — NEPRO/CARBSTEADY PO LIQD: 237.0000 mL | ORAL | Status: DC | PRN

## 2014-01-28 MED ORDER — INSULIN ASPART 100 UNIT/ML ~~LOC~~ SOLN: 0.0000 [IU] | Freq: Every day | SUBCUTANEOUS | Status: DC

## 2014-01-28 MED ORDER — INSULIN ASPART 100 UNIT/ML ~~LOC~~ SOLN: 4.0000 [IU] | Freq: Three times a day (TID) | SUBCUTANEOUS | Status: DC

## 2014-01-28 MED ORDER — INSULIN ASPART 100 UNIT/ML ~~LOC~~ SOLN: 0.0000 [IU] | Freq: Three times a day (TID) | SUBCUTANEOUS | Status: DC

## 2014-01-28 MED ORDER — SODIUM CHLORIDE 0.9 % IV SOLN: 100.0000 mL | INTRAVENOUS | Status: DC | PRN

## 2014-01-28 MED ORDER — INSULIN GLARGINE 100 UNIT/ML ~~LOC~~ SOLN: 28.0000 [IU] | Freq: Every day | SUBCUTANEOUS | Status: DC

## 2014-01-28 MED ORDER — ALTEPLASE 2 MG IJ SOLR
2.0000 mg | Freq: Once | INTRAMUSCULAR | Status: DC | PRN
  Filled 2014-01-28: qty 2

## 2014-01-28 MED ORDER — HEPARIN SODIUM (PORCINE) 1000 UNIT/ML DIALYSIS: 1000.0000 [IU] | INTRAMUSCULAR | Status: DC | PRN

## 2014-01-28 MED ORDER — INSULIN PEN NEEDLE 30G X 8 MM MISC: 1.0000 | Status: DC | PRN

## 2014-01-28 MED ORDER — DEXTROSE 5 % IV SOLN
2.0000 g | INTRAVENOUS | Status: AC
Start: 2014-01-28 — End: 2014-02-28

## 2014-01-28 MED ORDER — LIDOCAINE HCL (PF) 1 % IJ SOLN: 5.0000 mL | INTRAMUSCULAR | Status: DC | PRN

## 2014-01-28 MED ORDER — INSULIN ASPART 100 UNIT/ML FLEXPEN: 3.0000 [IU] | PEN_INJECTOR | Freq: Three times a day (TID) | SUBCUTANEOUS | Status: DC

## 2014-01-28 MED ORDER — INSULIN GLARGINE 100 UNIT/ML SOLOSTAR PEN: 28.0000 [IU] | PEN_INJECTOR | Freq: Every day | SUBCUTANEOUS | Status: DC

## 2014-01-28 MED ORDER — LIDOCAINE-PRILOCAINE 2.5-2.5 % EX CREA: 1.0000 "application " | TOPICAL_CREAM | CUTANEOUS | Status: DC | PRN

## 2014-01-28 MED ORDER — PENTAFLUOROPROP-TETRAFLUOROETH EX AERO: 1.0000 "application " | INHALATION_SPRAY | CUTANEOUS | Status: DC | PRN

## 2014-01-28 MED ORDER — VANCOMYCIN HCL IN DEXTROSE 1-5 GM/200ML-% IV SOLN: 1000.0000 mg | INTRAVENOUS | Status: AC

## 2014-01-28 MED ORDER — DSS 100 MG PO CAPS
200.0000 mg | ORAL_CAPSULE | Freq: Two times a day (BID) | ORAL | Status: DC
Start: 2014-01-28 — End: 2016-12-25

## 2014-01-28 NOTE — Progress Notes (Signed)
Inpatient Diabetes Program Recommendations  AACE/ADA: New Consensus Statement on Inpatient Glycemic Control (2013)  Target Ranges:  Prepandial:   less than 140 mg/dL      Peak postprandial:   less than 180 mg/dL (1-2 hours)      Critically ill patients:  140 - 180 mg/dL   Diabetes Coordinator met with patient to teach how to use the insulin pen.  Patient was able to perform teach back using the demo pen.  Patient was viewing the insulin pen video on the patient ed network at the end of our meeting.  No questions/concerns at this time.  Reminded patient she could ask her pharmacist if she has further questions about using the insulin pen after she gets home. Pt. Reports that she gets her meter supplies through the mail.   Thank you  Raoul Pitch BSN, RN,CDE Inpatient Diabetes Coordinator [phone] (team pager)

## 2014-01-28 NOTE — Progress Notes (Signed)
[facility]  Subjective  - Patient seen on the dialysis unit.   No symptoms of steal.   Objective 144/50 61 98 F (36.7 C) (Oral) 20 93%  Intake/Output Summary (Last 24 hours) at 01/28/14 0736 Last data filed at 01/27/14 2200  Gross per 24 hour  Intake    623 ml  Output      0 ml  Net    623 ml  Left AV Fistula has good palpable thrill at the anastomosis.  Left radial palpable pulse, sensation intact and equal bilateral. Incision C/D/I    Assessment/Planning: POD #1 (AV) FISTULA CREATION-left-brachial-cephalic May use fistula in 12 weeks. F/U with Dr. Kellie Simmering in 6 weeks in our office.   Laurence Slate [facility] 01/28/2014 7:36 AM --  Laboratory Lab Results:  Recent Labs  01/27/14 0240 01/28/14 0230  WBC 15.1* 12.1*  HGB 8.1* 7.9*  HCT 25.5* 26.1*  PLT 195 213   BMET  Recent Labs  01/27/14 0240 01/28/14 0230  NA 137 137  K 4.0 4.3  CL 98 97  CO2 28 27  GLUCOSE 183* 109*  BUN 23 36*  CREATININE 2.45* 3.91*  CALCIUM 8.2* 8.7    COAG Lab Results  Component Value Date   INR 1.07 01/27/2014   No results found for this basename: PTT

## 2014-01-28 NOTE — Progress Notes (Signed)
Physical Therapy Treatment Patient Details Name: LAKARA SEAVERS MRN: [id number] DOB: 09/16/1946 Today's Date: 01/28/2014 Time: [id number] PT Time Calculation (min): 25 min  PT Assessment / Plan / Recommendation  History of Present Illness 68 y.o. female with a history of HTN, DM2, Hyperlipidemia and CKD, who presented to the ED 2/14 with complaints of worsening SOB, and swelling of both of her legs over the past month. In the ED she was found to be in volume overload.  After she was admitted a diagnosis of AKI on CKD was made, and it became clear she would need HD for volume removal  The pt was taken to the OR on 2/18 for placement of a R IJ HD catheter.  After the OR procedue she had to be taken to the ICU because she became apneic during the vascular procedure, requiring full intubation.    PT Comments   Pt continues to demonstrate self-limiting behavior and was able to ambulate in room today with max encouragement. Pt encouraged to continue mobility and HEP throughout the day to maximize function. Pt with sats 95% on RA with drop to 88% with gait with cues for breathing technique with immediate return to 92%. Pt initially motivated to try walking in hall but while EOB lunch arrived and pt highly distracted by try that motivation appeared to decline with lunch arrival. Will continue to follow.   Follow Up Recommendations  SNF;Supervision/Assistance - 24 hour     Does the patient have the potential to tolerate intense rehabilitation     Barriers to Discharge        Equipment Recommendations  Rolling walker with 5" wheels    Recommendations for Other Services    Frequency     Progress towards PT Goals Progress towards PT goals: Progressing toward goals  Plan Current plan remains appropriate    Precautions / Restrictions Precautions Precautions: Fall   Pertinent Vitals/Pain Pt stated pain in her feet as 6/10 but in standing no grimace or difficulty noted due to pain    Mobility  Bed  Mobility Overal bed mobility: Needs Assistance Bed Mobility: Supine to Sit Supine to sit: Min assist General bed mobility comments: HOB flat with cues to ease sequence with min HHA to fully elevate trunk from surface Transfers Overall transfer level: Needs assistance Equipment used: Rolling walker (2 wheeled) Transfers: Sit to/from Stand Sit to Stand: Min assist General transfer comment: pt with increased time sitting EOB due to stating she just can't stand today. Pt required elevated bed and 4 attempts to stand before able to clear buttock. Min assist to stand with pt pulling on RW despite max multimodal cues for hand placement and with descent to surface. Question pt true attempt at standing on initial trials given she stood with such little assist when finally achieving standing.  Ambulation/Gait Ambulation/Gait assistance: Min guard Ambulation Distance (Feet): 15 Feet Assistive device: Rolling walker (2 wheeled) Gait Pattern/deviations: Step-through pattern;Decreased stride length;Trunk flexed;Wide base of support Gait velocity interpretation: <1.8 ft/sec, indicative of risk for recurrent falls General Gait Details: pt ambulated to door with max encouragement and chair pulled behind pt. Pt denied attempting hall ambulation.    Exercises General Exercises - Lower Extremity Ankle Circles/Pumps: AROM;Seated;Both;15 reps Long Arc Quad: AROM;Seated;Both;15 reps Hip Flexion/Marching: AROM;Seated;Both;15 reps   PT Diagnosis:    PT Problem List:   PT Treatment Interventions:     PT Goals (current goals can now be found in the care plan section)    Visit Information  Last PT Received On: 01/28/14 Assistance Needed: +1 History of Present Illness: 68 y.o. female with a history of HTN, DM2, Hyperlipidemia and CKD, who presented to the ED 2/14 with complaints of worsening SOB, and swelling of both of her legs over the past month. In the ED she was found to be in volume overload.  After she  was admitted a diagnosis of AKI on CKD was made, and it became clear she would need HD for volume removal  The pt was taken to the OR on 2/18 for placement of a R IJ HD catheter.  After the OR procedue she had to be taken to the ICU because she became apneic during the vascular procedure, requiring full intubation.     Subjective Data      Cognition  Cognition Arousal/Alertness: Awake/alert Behavior During Therapy: Flat affect Overall Cognitive Status: Within Functional Limits for tasks assessed    Balance     End of Session PT - End of Session Equipment Utilized During Treatment: Gait belt Activity Tolerance: Patient tolerated treatment well Patient left: in chair;with call bell/phone within reach;with nursing/sitter in room Nurse Communication: Mobility status   GP     Melford Aase 01/28/2014, 2:45 PM Elwyn Reach, [place]

## 2014-01-28 NOTE — Progress Notes (Signed)
Back from hemodialysis by bed awake and alert. 

## 2014-01-28 NOTE — Procedures (Signed)
I have seen and examined this patient and agree with the plan of care . Seen on dialysis [facility] W 01/28/2014, 8:47 AM

## 2014-01-28 NOTE — Care Management Note (Signed)
    Page 1 of 1   01/28/2014     1:58:14 PM   CARE MANAGEMENT NOTE 01/28/2014  Patient:  Jacqueline [name], Jacqueline [name]   Account Number:  [id number]  Date Initiated:  01/19/2014  Documentation initiated by:  [facility]  Subjective/Objective Assessment:   Admitted from [place] after patient became apnic prior to procedure - requring intubation.     Action/Plan:   Anticipated DC Date:  01/28/2014   Anticipated DC Plan:  SKILLED NURSING FACILITY  In-house referral  Clinical Social Worker      DC Planning Services  CM consult  Follow-up appt scheduled      Choice offered to / List presented to:             Status of service:  Completed, signed off Medicare Important Message given?   (If response is "NO", the following Medicare IM given date fields will be blank) Date Medicare IM given:   Date Additional Medicare IM given:    Discharge Disposition:  [place]  Per UR Regulation:  Reviewed for med. necessity/level of care/duration of stay  If discussed at [place] of Stay Meetings, dates discussed:   01/25/2014  01/27/2014    Comments:  ContactDannette Barbara Spouse     [phone]                 [place] Son     [phone] 2/27 1356 debbie Dinesh Ulysse rn,bsn [place] sleep study arranged for 03-01-14 at 8pm, have placed in dc packet for snf and written on front of packet.

## 2014-01-28 NOTE — Progress Notes (Signed)
Discharged to  [facility] facility by ambulance, stable, discharge summary and instructions given to ambulance staff. Belongings with pt.

## 2014-01-28 NOTE — Progress Notes (Signed)
PT Cancellation Note  Patient Details Name: Jacqueline [name] MRN: [id number] DOB: 04-04-1946   Cancelled Treatment:    Reason Eval/Treat Not Completed: Patient at procedure or test/unavailable (in HD and unavailable)   Melford Aase 01/28/2014, [place], [place]

## 2014-01-28 NOTE — Progress Notes (Signed)
ANTIBIOTIC CONSULT NOTE - FOLLOW UP  Pharmacy Consult for vancomycin, cefepime Indication: PNA  Allergies  Allergen Reactions  . Sulfa Drugs Cross Reactors Other (See Comments)    Doesn't remember     Patient Measurements: Height: 5\' 5"  (165.1 cm) Weight: 296 lb 8.3 oz (134.5 kg) IBW/kg (Calculated) : 57   Vital Signs: Temp: 98.7 F (37.1 C) (02/27 1224) Temp src: Oral (02/27 1224) BP: 124/76 mmHg (02/27 1224) Pulse Rate: 64 (02/27 1121) Intake/Output from previous day: 02/26 0701 - 02/27 0700 In: 623 [P.O.:300; I.V.:323] Out: -  Intake/Output from this shift: Total I/O In: -  Out: 2743 [Other:2743]  Labs:  Recent Labs  01/26/14 0304 01/27/14 0240 01/28/14 0230  WBC 15.6* 15.1* 12.1*  HGB 8.1* 8.1* 7.9*  PLT 230 195 213  CREATININE 3.37*  3.37* 2.45* 3.91*   Estimated Creatinine Clearance: 18.6 ml/min (by C-G formula based on Cr of 3.91). No results found for this basename: VANCOTROUGH, [name], VANCORANDOM, Swisher, GENTPEAK, GENTRANDOM, TOBRATROUGH, TOBRAPEAK, TOBRARND, AMIKACINPEAK, AMIKACINTROU, AMIKACIN,  in the last 72 hours   Microbiology: Recent Results (from the past 720 hour(s))  MRSA PCR SCREENING     Status: None   Collection Time    01/19/14 11:38 AM      Result Value Ref Range Status   MRSA by PCR NEGATIVE  NEGATIVE Final   Comment:            The GeneXpert MRSA Assay (FDA     approved for NASAL specimens     only), is one component of a     comprehensive MRSA colonization     surveillance program. It is not     intended to diagnose MRSA     infection nor to guide or     monitor treatment for     MRSA infections.  CULTURE, BLOOD (ROUTINE X 2)     Status: None   Collection Time    01/20/14  8:47 AM      Result Value Ref Range Status   Specimen Description BLOOD RIGHT ARM   Final   Special Requests BOTTLES DRAWN AEROBIC AND ANAEROBIC 5MLS   Final   Culture  Setup Time     Final   Value: 01/20/2014 12:18     Performed at [facility]   Culture     Final   Value: NO GROWTH 5 DAYS     Performed at [facility]   Report Status 01/26/2014 FINAL   Final  CULTURE, BLOOD (ROUTINE X 2)     Status: None   Collection Time    01/20/14  9:02 AM      Result Value Ref Range Status   Specimen Description BLOOD HEMODIALYSIS CATHETER   Final   Special Requests BOTTLES DRAWN AEROBIC AND ANAEROBIC 5CC   Final   Culture  Setup Time     Final   Value: 01/20/2014 13:57     Performed at [facility]   Culture     Final   Value: NO GROWTH 5 DAYS     Performed at [facility]   Report Status 01/26/2014 FINAL   Final  URINE CULTURE     Status: None   Collection Time    01/25/14  7:03 PM      Result Value Ref Range Status   Specimen Description URINE, CLEAN CATCH   Final   Special Requests NONE   Final   Culture  Setup Time  Final   Value: 01/25/2014 20:18     Performed at [facility]     Final   Value: 50,000 COLONIES/ML     Performed at [facility]   Culture     Final   Value: Multiple bacterial morphotypes present, none predominant. Suggest appropriate recollection if clinically indicated.     Performed at [facility]   Report Status 01/27/2014 FINAL   Final    Anti-infectives   Start     Dose/Rate Route Frequency Ordered Stop   01/28/14 1200  vancomycin (VANCOCIN) IVPB 1000 mg/200 mL premix     1,000 mg 200 mL/hr over 60 Minutes Intravenous Every M-W-F (Hemodialysis) 01/26/14 1539     01/28/14 1200  ceFEPIme (MAXIPIME) 2 g in dextrose 5 % 50 mL IVPB     2 g 100 mL/hr over 30 Minutes Intravenous Every M-W-F (Hemodialysis) 01/26/14 1539     01/26/14 2000  vancomycin (VANCOCIN) IVPB 1000 mg/200 mL premix     1,000 mg 200 mL/hr over 60 Minutes Intravenous  Once 01/26/14 1425 01/26/14 1933   01/26/14 2000  ceFEPIme (MAXIPIME) 2 g in dextrose 5 % 50 mL IVPB    Comments:  Give after Hd   2 g 100 mL/hr over 30 Minutes Intravenous  Once 01/26/14  1539 01/26/14 2107   01/25/14 1530  ceFEPIme (MAXIPIME) 2 g in dextrose 5 % 50 mL IVPB     2 g 100 mL/hr over 30 Minutes Intravenous  Once 01/25/14 1524 01/25/14 1655   01/25/14 1530  vancomycin (VANCOCIN) 2,500 mg in sodium chloride 0.9 % 500 mL IVPB     2,500 mg 250 mL/hr over 120 Minutes Intravenous  Once 01/25/14 1524 01/25/14 1923   01/19/14 0600  ceFAZolin (ANCEF) IVPB 1 g/50 mL premix  Status:  Discontinued     1 g 100 mL/hr over 30 Minutes Intravenous On call 01/18/14 1521 01/18/14 1617   01/19/14 0600  [MAR Hold]  ceFAZolin (ANCEF) 3 g in dextrose 5 % 50 mL IVPB     (On MAR Hold since 01/19/14 0717)   3 g 160 mL/hr over 30 Minutes Intravenous On call to O.R. 01/18/14 1618 01/19/14 0835      Assessment: 68 yo fenale on cefepime and vancomycin for suspected PNA.  WBC= 15.6, afeb and patient noted with ESRD with HD today  2/24 cefepime 2/24 vanc>>  2/24 urine- multiple organisms 2/19 blood x2- ngtd   Goal of Therapy:  Vancomycin trough level 15-20 mcg/ml  Plan:  -Vancomycin 1000mg  IV MWF with HD -Cefepime 2gm IV MWF with HD -Will follow renal function, cultures and clinical progress  Hildred Laser, Pharm D 01/28/2014 1:01 PM

## 2014-01-28 NOTE — Progress Notes (Signed)
Attempted to do in and out cath but unsuccessful. MD aware.

## 2014-01-28 NOTE — Progress Notes (Signed)
CSW went up to dialysis to provide bed offers to pt. CSW explained CSW remembered [facility] is pt's first choice, and pt explained she lives very close to this facility. CSW explained CSW has bee following up with this facility, and CSW is waiting to hear back. CSW called [facility] and administrator explained pt is slightly over their weight limit and the director of nursing is not comfortable taking her because she is new dialysis. CSW updated pt that [facility] is unable to offer a bed. Pt states [facility] in second choice, and CSW called [facility] and they do not have a bed to offer because they are full. Pt states she would like [facility], and her husband is going to come to the hospital this afternoon and they will discuss placement further. CSW to check in pt's room this afternoon when she is back from dialysis and has had a chance to speak with her husband about placement.   Ky Barban, MSW, [facility] Clinical Social Worker [phone]

## 2014-01-28 NOTE — Discharge Summary (Signed)
Physician Discharge Summary  Jacqueline [name] [id number] DOB: Mar 20, 1946 DOA: 01/15/2014  PCP: Salena Saner., MD  Admit date: 01/15/2014 Discharge date: 01/28/2014  Time spent: >45 minutes  Recommendations for Outpatient Follow-up:  1. Pt will need repeat UA to ensure clearing of UTI- culture was not helpful  Discharge Diagnoses:  Principal Problem:   Acute diastolic CHF (congestive heart failure) Active Problems:   CKD (chronic kidney disease) stage 4, GFR 15-29 ml/min   Hypertension   Diabetes mellitus   Anemia   Acute respiratory failure   Acute encephalopathy   OSA (obstructive sleep apnea)   Obesity hypoventilation syndrome   Discharge Condition: stable Diet recommendation: heart healthy, renal and moderate carb modified  Filed Weights   01/28/14 0432 01/28/14 1223  Weight: 137.4 kg (302 lb 14.6 oz) 134.5 kg (296 lb 8.3 oz)    History of present illness:  68 y.o. female with a history of HTN, DM2, Hyperlipidemia and CKD, who presented to the ED 2/14 with complaints of worsening SOB, and swelling of both of her legs over the past month. In the ED she was found to be in volume overload. After she was admitted a diagnosis of AKI on CKD was made, and it became clear she would need HD for volume removal The pt was taken to the OR on 2/18 for placement of a R IJ HD catheter. After the OR procedue she had to be taken to the ICU because she became apneic during the vascular procedure, requiring full intubation.    Hospital Course:  Acute on chronic respiratory failure  Multifactorial with primary issues being OHS/OSA as well as volume overload in the setting of end-stage renal disease - see below   Suspected OSA / OHS  Auto-set BiPAP QHS - will need f/u with a sleep MD as outpatient for a formal sleep study - Pulm to arrange - prognosis is quite poor unless pt decides to comply w/ BIPAP use - cont to educate - pt has been slowly more compliant with BiPAP therapy    Progressive CKD due to DM, HTN, CHF > now ESRD  Per Nephrology - HD has been initiated - failed trial of diuretic - per Neph will need permanent HD access prior to d/c -left forearm  AV fistula 2/26   Right basilar healthcare acquired pneumonia  Continue empiric antibiotic for planned 7 day course   UTI  UA c/w UTI - unfortunately culture growing multiple morphotypes- recheck UA    Leukocytosis  - not resolving repeat UA   Acute on chronic CHF, diastolic w/ Mild pulmonary edema  Volume now being managed by HD - counseling on diet - fluid and Na restrict   L ankle pain  Improved - Xrays w/o boney findings - uric acid normal - follow -   HTN  Increased labetalol to 200 BID with better control  DM  CBG better controlled at current doses  Morbid obesity - Body mass index is 50.11 kg/(m^2).  Nutrition consult   Anemia of chronic kidney disease  Fe and epo per Nephrology -   Procedures: 2/18 R IJ tunneled HD cath  2/18 intubated briefly after surgery 2/26 AV Fistula left brachial cephalic  Consultations:  Nephrology  Vascular surg  TCTS  Antibiotics:  Cefepime 2/24>>  Vancomycin 2/24>>   Discharge Exam: Filed Vitals:   01/28/14 1224  BP: 124/76  Pulse: 64  Temp: 98.7 F (37.1 C)  Resp: 20    General: No acute respiratory distress - alert  and conversant  Lungs: Poor air movement/distant breath sounds throughout all fields with no appreciable wheezes  Cardiovascular: Distant heart sounds - regular rate and rhythm  Abdomen: Morbidly obese, nontender, nondistended, soft, bowel sounds positive, no rebound, no ascites, no appreciable mass  Extremities: No significant cyanosis, or clubbing; no significant appreciable joint abnormalities or edema  Discharge Instructions       Future Appointments Provider Department Dept Phone   03/15/2014 11:00 AM [facility] [place] [phone]   03/15/2014 1:15 PM Mal Misty, MD  [facility] [phone]       Medication List    STOP taking these medications       AZOR 5-40 MG per tablet  Generic drug:  amLODipine-olmesartan     glipiZIDE 10 MG tablet  Commonly known as:  GLUCOTROL     magnesium 30 MG tablet     nebivolol 10 MG tablet  Commonly known as:  BYSTOLIC     OVER THE COUNTER MEDICATION     potassium chloride 20 MEQ packet  Commonly known as:  KLOR-CON     torsemide 10 MG tablet  Commonly known as:  DEMADEX      TAKE these medications       albuterol 108 (90 BASE) MCG/ACT inhaler  Commonly known as:  PROVENTIL HFA;VENTOLIN HFA  Inhale 2 puffs into the lungs every 6 (six) hours as needed for wheezing or shortness of breath.     b complex vitamins capsule  Take 1 capsule by mouth daily.     cholecalciferol 1000 UNITS tablet  Commonly known as:  VITAMIN D  Take 2,000 Units by mouth daily.     dextrose 5 % SOLN 50 mL with ceFEPIme 2 G SOLR 2 g  Inject 2 g into the vein every Monday, Wednesday, and Friday with hemodialysis.     DSS 100 MG Caps  Take 200 mg by mouth 2 (two) times daily.     fish oil-omega-3 fatty acids 1000 MG capsule  Take 1 g by mouth daily.     Garlic [id number] MG Caps  Take 2 capsules by mouth daily.     insulin aspart 100 UNIT/ML FlexPen  Commonly known as:  NOVOLOG FLEXPEN  Inject 3 Units into the skin 3 (three) times daily with meals.     Insulin Glargine 100 UNIT/ML Solostar Pen  Commonly known as:  LANTUS SOLOSTAR  Inject 28 Units into the skin daily at 10 pm.     Insulin Pen Needle 30G X 8 MM Misc  Commonly known as:  NOVOFINE  Inject 10 each into the skin as needed.     labetalol 200 MG tablet  Commonly known as:  NORMODYNE  Take 1 tablet (200 mg total) by mouth 2 (two) times daily.     polysaccharide iron 150 MG capsule  Generic drug:  iron polysaccharides  Take 150 mg by mouth daily.     pravastatin 20 MG tablet  Commonly known as:  PRAVACHOL  Take 20 mg by  mouth at bedtime.     prazosin 5 MG capsule  Commonly known as:  MINIPRESS  Take 10 mg by mouth at bedtime.     vancomycin 1 GM/200ML Soln  Commonly known as:  VANCOCIN  Inject 200 mLs (1,000 mg total) into the vein every Monday, Wednesday, and Friday with hemodialysis.       Allergies  Allergen Reactions  . Sulfa Drugs Cross Reactors Other (See Comments)  Doesn't remember    Follow-up Information   Follow up with Tinnie Gens, MD In 6 weeks. (sent)    Specialty:  Vascular Surgery   Contact information:   [address] [place] [address] [phone]       Follow up with Salena Saner., MD. Schedule an appointment as soon as possible for a visit in 1 week.   Specialty:  Internal Medicine   Contact information:   [address] [place] [address] [phone]        The results of significant diagnostics from this hospitalization (including imaging, microbiology, ancillary and laboratory) are listed below for reference.    Significant Diagnostic Studies: Dg Chest 1 View  01/19/2014   CLINICAL DATA:  Status post Diatek placement.  EXAM: CHEST - 1 VIEW  COMPARISON:  01/15/2014  FINDINGS: There has been interval placement of a right jugular central venous catheter with tip overlying the mid right atrium. The cardiac silhouette remains enlarged. Thoracic aortic calcification is present. No pneumothorax is identified. Lung volumes are diminished compared to the prior study with increased pulmonary vascular congestion and bibasilar opacities.  IMPRESSION: 1. Interval right jugular central venous catheter placement with tip overlying the right atrium. 2. Diminished lung volumes with mild pulmonary edema and increased bibasilar opacities, which may reflect atelectasis. These results were called by telephone at the time of interpretation on 01/19/2014 at 10:45 AM to Ashley Akin in the PACU who verbally acknowledged these results.   Electronically Signed   By:  Logan Bores   On: 01/19/2014 10:45   Dg Chest 2 View  01/15/2014   CLINICAL DATA:  Short of breath, leg swelling  EXAM: CHEST  2 VIEW  COMPARISON:  Prior chest x-ray 10/16/2011  FINDINGS: Increased cardiomegaly. Pulmonary vascular congestion with mild interstitial edema. Bibasilar opacities favored to reflect dependent edema and atelectasis. No pneumothorax. No large pleural effusion. No acute osseous abnormality.  IMPRESSION: Mild-moderate CHF.   Electronically Signed   By: Jacqulynn Cadet M.D.   On: 01/15/2014 18:56   Dg Ankle Complete Left  01/25/2014   CLINICAL DATA:  Ankle pain and edema.  Tenderness.  EXAM: LEFT ANKLE COMPLETE - 3+ VIEW  COMPARISON:  DG ANKLE COMPLETE*L* dated 06/01/2005  FINDINGS: Soft tissue swelling is present in the leg and ankle. The ankle mortise is congruent. The talar dome appears intact. There is no fracture. Diabetic type small vessel atherosclerosis calcification is present. Mild midfoot osteoarthritis. Anatomic alignment. First MTP joint osteoarthritis incidentally noted.  IMPRESSION: Soft tissue swelling in the leg and ankle.  No osseous injury.   Electronically Signed   By: Dereck Ligas M.D.   On: 01/25/2014 14:34   Dg Chest Port 1 View  01/25/2014   CLINICAL DATA:  Cough, leukocytosis  EXAM: PORTABLE CHEST - 1 VIEW  COMPARISON:  January 19, 2014  FINDINGS: The heart size and mediastinal contours are stable. Heart size is enlarged. The aorta is tortuous. Right central venous line is unchanged. There is pulmonary edema. Patchy consolidation of the right lung base is noted. Evaluation of left lung base is limited due to technique. The visualized skeletal structures are stable.  IMPRESSION: Pulmonary edema.  Patchy pneumonia of medial right lung base.   Electronically Signed   By: Abelardo Diesel M.D.   On: 01/25/2014 14:37    Microbiology: Recent Results (from the past 240 hour(s))  MRSA PCR SCREENING     Status: None   Collection Time    01/19/14 11:38  AM       Result Value Ref Range Status   MRSA by PCR NEGATIVE  NEGATIVE Final   Comment:            The GeneXpert MRSA Assay (FDA     approved for NASAL specimens     only), is one component of a     comprehensive MRSA colonization     surveillance program. It is not     intended to diagnose MRSA     infection nor to guide or     monitor treatment for     MRSA infections.  CULTURE, BLOOD (ROUTINE X 2)     Status: None   Collection Time    01/20/14  8:47 AM      Result Value Ref Range Status   Specimen Description BLOOD RIGHT ARM   Final   Special Requests BOTTLES DRAWN AEROBIC AND ANAEROBIC 5MLS   Final   Culture  Setup Time     Final   Value: 01/20/2014 12:18     Performed at [facility]   Culture     Final   Value: NO GROWTH 5 DAYS     Performed at [facility]   Report Status 01/26/2014 FINAL   Final  CULTURE, BLOOD (ROUTINE X 2)     Status: None   Collection Time    01/20/14  9:02 AM      Result Value Ref Range Status   Specimen Description BLOOD HEMODIALYSIS CATHETER   Final   Special Requests BOTTLES DRAWN AEROBIC AND ANAEROBIC 5CC   Final   Culture  Setup Time     Final   Value: 01/20/2014 13:57     Performed at [facility]   Culture     Final   Value: NO GROWTH 5 DAYS     Performed at [facility]   Report Status 01/26/2014 FINAL   Final  URINE CULTURE     Status: None   Collection Time    01/25/14  7:03 PM      Result Value Ref Range Status   Specimen Description URINE, CLEAN CATCH   Final   Special Requests NONE   Final   Culture  Setup Time     Final   Value: 01/25/2014 20:18     Performed at [facility]     Final   Value: 50,000 COLONIES/ML     Performed at [facility]   Culture     Final   Value: Multiple bacterial morphotypes present, none predominant. Suggest appropriate recollection if clinically indicated.     Performed at [facility]   Report Status 01/27/2014 FINAL   Final      Labs: Basic Metabolic Panel:  Recent Labs Lab 01/24/14 0339 01/25/14 0633 01/26/14 0304 01/27/14 0240 01/28/14 0230  NA 138 139 135*  134* 137 137  K 4.1 3.8 3.8  3.8 4.0 4.3  CL 98 97 95*  94* 98 97  CO2 27 28 26  25 28 27   GLUCOSE 158* 167* 167*  170* 183* 109*  BUN 61* 29* 44*  45* 23 36*  CREATININE 3.89* 2.67* 3.37*  3.37* 2.45* 3.91*  CALCIUM 8.5 8.4 8.4  8.5 8.2* 8.7  PHOS 4.6 3.5 4.3 3.3 5.2*   Liver Function Tests:  Recent Labs Lab 01/24/14 0339 01/25/14 0633 01/26/14 0304 01/27/14 0240 01/28/14 0230  ALBUMIN 2.7* 2.7* 2.6* 2.5* 2.5*   No  results found for this basename: LIPASE, AMYLASE,  in the last 168 hours No results found for this basename: AMMONIA,  in the last 168 hours CBC:  Recent Labs Lab 01/22/14 0323 01/25/14 0630 01/26/14 0304 01/27/14 0240 01/28/14 0230  WBC 15.2* 16.6* 15.6* 15.1* 12.1*  HGB 7.9* 8.4* 8.1* 8.1* 7.9*  HCT 26.1* 26.4* 25.7* 25.5* 26.1*  MCV 93.9 92.6 91.8 92.4 93.9  PLT 213 227 230 195 213   Cardiac Enzymes: No results found for this basename: CKTOTAL, CKMB, CKMBINDEX, TROPONINI,  in the last 168 hours BNP: BNP (last 3 results)  Recent Labs  01/15/14 1730  PROBNP 4646.0*   CBG:  Recent Labs Lab 01/27/14 1043 01/27/14 1217 01/27/14 1619 01/27/14 2125 01/28/14 1230  GLUCAP 146* 169* 171* 158* 135*       SignedDebbe Odea, MD  [facility] 01/28/2014, 1:25 PM

## 2014-01-28 NOTE — Progress Notes (Signed)
As endorsed pt is in the hemodialysis .

## 2014-02-01 ENCOUNTER — Non-Acute Institutional Stay (SKILLED_NURSING_FACILITY): Payer: Medicare Other | Admitting: Internal Medicine

## 2014-02-01 ENCOUNTER — Encounter: Payer: Self-pay | Admitting: Internal Medicine

## 2014-02-01 DIAGNOSIS — D689 Coagulation defect, unspecified: Secondary | ICD-10-CM | POA: Insufficient documentation

## 2014-02-01 DIAGNOSIS — Z90710 Acquired absence of both cervix and uterus: Secondary | ICD-10-CM | POA: Insufficient documentation

## 2014-02-01 DIAGNOSIS — D509 Iron deficiency anemia, unspecified: Secondary | ICD-10-CM | POA: Insufficient documentation

## 2014-02-01 DIAGNOSIS — R509 Fever, unspecified: Secondary | ICD-10-CM | POA: Insufficient documentation

## 2014-02-01 DIAGNOSIS — Z833 Family history of diabetes mellitus: Secondary | ICD-10-CM | POA: Insufficient documentation

## 2014-02-01 DIAGNOSIS — I5031 Acute diastolic (congestive) heart failure: Secondary | ICD-10-CM

## 2014-02-01 DIAGNOSIS — N186 End stage renal disease: Secondary | ICD-10-CM

## 2014-02-01 DIAGNOSIS — J189 Pneumonia, unspecified organism: Secondary | ICD-10-CM

## 2014-02-01 DIAGNOSIS — G4733 Obstructive sleep apnea (adult) (pediatric): Secondary | ICD-10-CM

## 2014-02-01 DIAGNOSIS — Z8249 Family history of ischemic heart disease and other diseases of the circulatory system: Secondary | ICD-10-CM | POA: Insufficient documentation

## 2014-02-01 DIAGNOSIS — M25579 Pain in unspecified ankle and joints of unspecified foot: Secondary | ICD-10-CM

## 2014-02-01 DIAGNOSIS — Z992 Dependence on renal dialysis: Principal | ICD-10-CM

## 2014-02-01 DIAGNOSIS — E662 Morbid (severe) obesity with alveolar hypoventilation: Secondary | ICD-10-CM

## 2014-02-01 DIAGNOSIS — I10 Essential (primary) hypertension: Secondary | ICD-10-CM

## 2014-02-01 DIAGNOSIS — I509 Heart failure, unspecified: Secondary | ICD-10-CM

## 2014-02-01 DIAGNOSIS — N2581 Secondary hyperparathyroidism of renal origin: Secondary | ICD-10-CM

## 2014-02-01 DIAGNOSIS — Z794 Long term (current) use of insulin: Secondary | ICD-10-CM | POA: Insufficient documentation

## 2014-02-01 DIAGNOSIS — Z882 Allergy status to sulfonamides status: Secondary | ICD-10-CM | POA: Insufficient documentation

## 2014-02-01 DIAGNOSIS — R519 Headache, unspecified: Secondary | ICD-10-CM | POA: Insufficient documentation

## 2014-02-01 DIAGNOSIS — E119 Type 2 diabetes mellitus without complications: Secondary | ICD-10-CM

## 2014-02-01 DIAGNOSIS — R0600 Dyspnea, unspecified: Secondary | ICD-10-CM | POA: Insufficient documentation

## 2014-02-01 NOTE — Progress Notes (Signed)
Patient ID: Jacqueline [name], female   DOB: 09/24/1946, 68 y.o.   MRN: [id number]  Provider:  Rexene Edison. Mariea Clonts, D.O., C.M.D. Location:  [facility]  PCP: Salena Saner., MD  Code Status: full code  Allergies  Allergen Reactions  . Sulfa Drugs Cross Reactors Other (See Comments)    Doesn't remember     Chief Complaint  Patient presents with  . Hospitalization Follow-up    new admission     HPI: 68 y.o. female with h/o CKD IV, htn, diabetes, anemia, OSA, obesity hypoventilation syndrome and chronic diastolic chf was admitted here for short term rehab s/p hospitalization from 2/14-2/27/15.  She had gone to the hospital with increased dyspnea, edema bilateral LE over the past month.  She was diagnosed with acute on chronic kidney failure.  She had a right IJ HD catheter placed on 2/18.  She became apneic after the procedure and had to be intubated briefly.  She improved with HD treatments after failing a trial of diuretics.  She has been started on autoset bipap at hs, but has not been very compliant at the hospital.  She needs a formal sleep study.  2/26, she had permanent HD access placement with left forearm AV fistula.  She was treated empirically with a 7 day course of antibiotics (vanc and cefepime begun 2/24) for right basilar HCAP.  She had a UA c+s done, but needs a repeat as it showed mixed organisms as her leukocytosis has not resolved.    She is on a sodium and fluid restricted diet for her chronic diastolic chf.  No etiology could be found for left ankle pain (uric acid and xrays unremarkable).  BP was elevated and labetalol was increased to 200mg  po bid.  CBGs were satisfactory.    ROS: Review of Systems  Constitutional: Positive for malaise/fatigue.  Eyes: Negative for blurred vision.  Respiratory: Negative for shortness of breath.   Cardiovascular: Negative for chest pain.  Gastrointestinal: Positive for heartburn.  Genitourinary: Negative for dysuria.    Musculoskeletal: Negative for falls and myalgias.  Neurological: Positive for weakness. Negative for loss of consciousness and headaches.  Endo/Heme/Allergies: Bruises/bleeds easily.  Psychiatric/Behavioral: Negative for memory loss.     Past Medical History  Diagnosis Date  . Hypertension   . Diabetes mellitus   . Hyperlipidemia   . Obesity    Past Surgical History  Procedure Laterality Date  . Abdominal hysterectomy    . Insertion of dialysis catheter N/A 01/19/2014    Procedure: INSERTION OF DIALYSIS CATHETER;  Surgeon: Rosetta Posner, MD;  Location: [place];  Service: Vascular;  Laterality: N/A;  . Av fistula placement Left 01/27/2014    Procedure: ARTERIOVENOUS (AV) FISTULA CREATION;  Surgeon: Mal Misty, MD;  Location: [place];  Service: Vascular;  Laterality: Left;   Social History:   reports that she has never smoked. She has never used smokeless tobacco. She reports that she does not drink alcohol or use illicit drugs.  Family History  Problem Relation Age of Onset  . Diabetes Mother   . CAD Father   . CAD Sister     Medications: Patient's Medications  New Prescriptions   No medications on file  Previous Medications   ALBUTEROL (PROVENTIL HFA;VENTOLIN HFA) 108 (90 BASE) MCG/ACT INHALER    Inhale 2 puffs into the lungs every 6 (six) hours as needed for wheezing or shortness of breath.   B COMPLEX VITAMINS CAPSULE    Take 1 capsule by  mouth daily.   CHOLECALCIFEROL (VITAMIN D) 1000 UNITS TABLET    Take 2,000 Units by mouth daily.   DEXTROSE 5 % SOLN 50 ML WITH CEFEPIME 2 G SOLR 2 G    Inject 2 g into the vein every Monday, Wednesday, and Friday with hemodialysis.   DOCUSATE SODIUM 100 MG CAPS    Take 200 mg by mouth 2 (two) times daily.   FISH OIL-OMEGA-3 FATTY ACIDS 1000 MG CAPSULE    Take 1 g by mouth daily.     GARLIC [id number] MG CAPS    Take 2 capsules by mouth daily.     INSULIN ASPART (NOVOLOG FLEXPEN) 100 UNIT/ML FLEXPEN    Inject 3 Units into the skin 3 (three)  times daily with meals.   INSULIN GLARGINE (LANTUS SOLOSTAR) 100 UNIT/ML SOLOSTAR PEN    Inject 28 Units into the skin daily at 10 pm.   INSULIN PEN NEEDLE (NOVOFINE) 30G X 8 MM MISC    Inject 10 each into the skin as needed.   LABETALOL (NORMODYNE) 200 MG TABLET    Take 1 tablet (200 mg total) by mouth 2 (two) times daily.   POLYSACCHARIDE IRON (NIFEREX) 150 MG CAPS CAPSULE    Take 150 mg by mouth daily.     PRAVASTATIN (PRAVACHOL) 20 MG TABLET    Take 20 mg by mouth at bedtime.     PRAZOSIN (MINIPRESS) 5 MG CAPSULE    Take 10 mg by mouth at bedtime.     VANCOMYCIN (VANCOCIN) 1 GM/200ML SOLN    Inject 200 mLs (1,000 mg total) into the vein every Monday, Wednesday, and Friday with hemodialysis.  Modified Medications   No medications on file  Discontinued Medications   No medications on file     Physical Exam: There were no vitals filed for this visit. Physical Exam  Constitutional: She is oriented to person, place, and time. No distress.  HENT:  Head: Normocephalic and atraumatic.  Right Ear: External ear normal.  Left Ear: External ear normal.  Nose: Nose normal.  Mouth/Throat: Oropharynx is clear and moist.  Eyes: Conjunctivae and EOM are normal. Pupils are equal, round, and reactive to light.  Neck: Normal range of motion. Neck supple. No JVD present.  Cardiovascular: Normal rate, regular rhythm, normal heart sounds and intact distal pulses.   Now with left forearm AV fistula placed for her long term HD  Pulmonary/Chest: Effort normal and breath sounds normal. No respiratory distress.  Abdominal: Soft. Bowel sounds are normal. She exhibits no distension and no mass. There is no tenderness.  Musculoskeletal: Normal range of motion.  Neurological: She is alert and oriented to person, place, and time.  Skin: Skin is warm and dry.  Psychiatric: She has a normal mood and affect.    Labs reviewed: Basic Metabolic Panel:  Recent Labs  01/26/14 0304 01/27/14 0240 01/28/14 0230    NA 135*  134* 137 137  K 3.8  3.8 4.0 4.3  CL 95*  94* 98 97  CO2 26  25 28 27   GLUCOSE 167*  170* 183* 109*  BUN 44*  45* 23 36*  CREATININE 3.37*  3.37* 2.45* 3.91*  CALCIUM 8.4  8.5 8.2* 8.7  PHOS 4.3 3.3 5.2*   Liver Function Tests:  Recent Labs  01/20/14 0912  01/26/14 0304 01/27/14 0240 01/28/14 0230  AST 15  --   --   --   --   ALT 9  --   --   --   --  ALKPHOS 69  --   --   --   --   BILITOT 0.2*  --   --   --   --   PROT 6.3  --   --   --   --   ALBUMIN 3.0*  < > 2.6* 2.5* 2.5*  < > = values in this interval not displayed. CBC:  Recent Labs  01/15/14 1730  01/26/14 0304 01/27/14 0240 01/28/14 0230  WBC 10.4  < > 15.6* 15.1* 12.1*  NEUTROABS 8.0*  --   --   --   --   HGB 9.0*  < > 8.1* 8.1* 7.9*  HCT 28.9*  < > 25.7* 25.5* 26.1*  MCV 92.3  < > 91.8 92.4 93.9  PLT 294  < > 230 195 213  < > = values in this interval not displayed. CBG:  Recent Labs  01/27/14 2125 01/28/14 1230 01/28/14 1622  GLUCAP 158* 135* 182*   Imaging and Procedures: 01/15/14:  cxr Mild-moderate CHF.  01/19/14:  cxr 1. Interval right jugular central venous catheter placement with tip  overlying the right atrium.  2. Diminished lung volumes with mild pulmonary edema and increased bibasilar opacities, which may reflect atelectasis.  These results were called by telephone at the time of interpretation on 01/19/2014 at 10:45 AM to Ashley Akin in the PACU who verbally acknowledged these results  01/25/14:  Left ankle xray:  Soft tissue swelling in the leg and ankle. No osseous injury.  01/25/14:  CXR:  Pulmonary edema. Patchy pneumonia of medial right lung base.  Assessment/Plan 1. ESRD (end stage renal disease) on dialysis -just started on HD, cont as planned, has temporary access at this time with R IJ catheter and had AV fistula placed in forearm for long term access  2. OSA (obstructive sleep apnea) -needs formal outpatient sleep study (or snap diagnostics study  here)  3. Acute diastolic CHF (congestive heart failure) -has improved with HD for volume overload -cont current therapy  4. Hypertension -bp at goal with current meds and hd treatments  5. Diabetes mellitus -cont lantus with novolog meal coverage  6. Secondary hyperparathyroidism (of renal origin) -cont vitamin d, may need addition of binding agents as per renal recs  7. Obesity hypoventilation syndrome -again, needs formal sleep study done for diagnostic and cpap titration purposes  8. Pain in joint, ankle and foot -xray was negative during hospitalization,   9.  HCAP:  Treated with 7 day course of vanc and cefepime which was continued at her HD.  Functional status:  Needing some assistance with her ADLs at this time, here for therapy due to deconditioning from acute illness  Family/ staff Communication: discussed with unit supervisor   Labs/tests ordered: cbc, bmp, repeat ua c+s as recommended by hospitalist at d/c

## 2014-02-04 ENCOUNTER — Non-Acute Institutional Stay (SKILLED_NURSING_FACILITY): Payer: Medicare Other | Admitting: Internal Medicine

## 2014-02-04 DIAGNOSIS — N186 End stage renal disease: Secondary | ICD-10-CM

## 2014-02-04 DIAGNOSIS — E119 Type 2 diabetes mellitus without complications: Secondary | ICD-10-CM

## 2014-02-04 DIAGNOSIS — K3184 Gastroparesis: Secondary | ICD-10-CM

## 2014-02-04 DIAGNOSIS — Z992 Dependence on renal dialysis: Secondary | ICD-10-CM

## 2014-02-04 NOTE — Progress Notes (Signed)
Patient ID: Jacqueline [name], female   DOB: Oct 13, 1946, 68 y.o.   MRN: [id number]    Armandina Gemma living [employer]  Chief Complaint  Patient presents with  . Acute Visit    heartburn, uanble to keep food down   Allergies  Allergen Reactions  . Sulfa Drugs Cross Reactors Other (See Comments)    Doesn't remember     HPI 68 y/o female patient is here for STR after hospital admission with renal failure, pneumonia. She complaints of heartburn and nausea with meals. She has not had vomiting but feels like vomiting. No other complaints. She also complaints of her feet hurting. No other complaints.    Review of Systems  Constitutional: has fatigue Eyes: Negative for blurred vision.  Respiratory: Negative for shortness of breath.   Cardiovascular: Negative for chest pain.  Gastrointestinal: Positive for heartburn and nausea Genitourinary: Negative for dysuria.  Musculoskeletal: Negative for falls and myalgias.  Neurological: Positive for weakness Psychiatric/Behavioral: Negative for memory loss.   Past Medical History  Diagnosis Date  . Hypertension   . Diabetes mellitus   . Hyperlipidemia   . Obesity    Medication reviewed. See [facility]  Past Surgical History  Procedure Laterality Date  . Abdominal hysterectomy    . Insertion of dialysis catheter N/A 01/19/2014    Procedure: INSERTION OF DIALYSIS CATHETER;  Surgeon: Rosetta Posner, MD;  Location: [place];  Service: Vascular;  Laterality: N/A;  . Av fistula placement Left 01/27/2014    Procedure: ARTERIOVENOUS (AV) FISTULA CREATION;  Surgeon: Mal Misty, MD;  Location: [facility] OR;  Service: Vascular;  Laterality: Left;    Physical exam BP 148/61  Pulse 60  Temp(Src) 97.5 F (36.4 C)  Resp 18  Wt 288 lb (130.636 kg)  SpO2 94%  Constitutional: She is oriented to person, place, and time. No distress.  HENT:   Head: Normocephalic and atraumatic.  Mouth/Throat: Oropharynx is clear and moist.  Neck: Normal range of motion. Neck supple. No  JVD present.  Cardiovascular: Normal rate, regular rhythm, normal heart sounds and intact distal pulses.   Pulmonary/Chest: Effort normal and breath sounds normal. No respiratory distress.  Abdominal: Soft. Bowel sounds are normal. She exhibits no distension and no mass. There is no tenderness.  Musculoskeletal: Normal range of motion.  Neurological: She is alert and oriented to person, place, and time.  Skin: Skin is warm and dry. Left arm AV fistula Psychiatric: She has a normal mood and affect.   Labs-  Lab Results  Component Value Date   HGBA1C 7.3* 01/16/2014   CBC    Component Value Date/Time   WBC 12.1* 01/28/2014 0230   RBC 2.78* 01/28/2014 0230   RBC 3.00* 01/15/2014 2335   HGB 7.9* 01/28/2014 0230   HCT 26.1* 01/28/2014 0230   PLT 213 01/28/2014 0230   MCV 93.9 01/28/2014 0230   MCH 28.4 01/28/2014 0230   MCHC 30.3 01/28/2014 0230   RDW 16.6* 01/28/2014 0230   LYMPHSABS 1.5 01/15/2014 1730   MONOABS 0.5 01/15/2014 1730   EOSABS 0.3 01/15/2014 1730   BASOSABS 0.0 01/15/2014 1730    Assessment/plan  Gastroparesis- heartburn and difficulty to keep food down with her obesity and history of diabetes raises concerns for gastroparesis. Will tart her on omeprazole 20 mg daily with reglan 10 mg bid with the large meals of the day. Also encouraged to have small portion meals. Will reassess if no improvement.  ESRD- continue HD 3 days / weel  Diabetes mellitus- monitor  cbg, continue lantus with novolog meal coverage

## 2014-02-13 DIAGNOSIS — J189 Pneumonia, unspecified organism: Secondary | ICD-10-CM | POA: Insufficient documentation

## 2014-02-14 ENCOUNTER — Non-Acute Institutional Stay (SKILLED_NURSING_FACILITY): Payer: Medicare Other | Admitting: Internal Medicine

## 2014-02-14 ENCOUNTER — Encounter: Payer: Self-pay | Admitting: Internal Medicine

## 2014-02-14 DIAGNOSIS — L739 Follicular disorder, unspecified: Secondary | ICD-10-CM

## 2014-02-14 DIAGNOSIS — L738 Other specified follicular disorders: Secondary | ICD-10-CM

## 2014-02-14 DIAGNOSIS — K5900 Constipation, unspecified: Secondary | ICD-10-CM

## 2014-02-14 NOTE — Progress Notes (Signed)
Patient ID: Jacqueline [name], female   DOB: 1946/10/19, 68 y.o.   MRN: [id number]    Armandina Gemma living [place]  Allergies  Allergen Reactions  . Sulfa Drugs Cross Reactors Other (See Comments)    Doesn't remember    Chief Complaint  Patient presents with  . Acute Visit    boil under left breast   HPI 68 y/o female patient is here for STR after hospital admission with renal failure, pneumonia. Staff has noticed a boil under her left breast while cleaning her today with drainage. Patient complaints of discomfort in the area. Denies any fever or chills.   Review of Systems   Constitutional: energy level is slowly improving Eyes: Negative for blurred vision.   Respiratory: Negative for shortness of breath.    Cardiovascular: Negative for chest pain.   Gastrointestinal: Positive for heartburn and nausea Genitourinary: Negative for dysuria.   Musculoskeletal: Negative for falls and myalgias.   Neurological: Positive for weakness Psychiatric/Behavioral: Negative for memory loss.   Past Medical History  Diagnosis Date  . Hypertension   . Diabetes mellitus   . Hyperlipidemia   . Obesity    Current Outpatient Prescriptions on File Prior to Visit  Medication Sig Dispense Refill  . albuterol (PROVENTIL HFA;VENTOLIN HFA) 108 (90 BASE) MCG/ACT inhaler Inhale 2 puffs into the lungs every 6 (six) hours as needed for wheezing or shortness of breath.      Marland Kitchen b complex vitamins capsule Take 1 capsule by mouth daily.      . cholecalciferol (VITAMIN D) 1000 UNITS tablet Take 2,000 Units by mouth daily.      Marland Kitchen dextrose 5 % SOLN 50 mL with ceFEPIme 2 G SOLR 2 g Inject 2 g into the vein every Monday, Wednesday, and Friday with hemodialysis.      Marland Kitchen docusate sodium 100 MG CAPS Take 200 mg by mouth 2 (two) times daily.  10 capsule  0  . fish oil-omega-3 fatty acids 1000 MG capsule Take 1 g by mouth daily.        . Garlic [id number] MG CAPS Take 2 capsules by mouth daily.        . insulin aspart (NOVOLOG  FLEXPEN) 100 UNIT/ML FlexPen Inject 3 Units into the skin 3 (three) times daily with meals.  15 mL  11  . Insulin Glargine (LANTUS SOLOSTAR) 100 UNIT/ML Solostar Pen Inject 28 Units into the skin daily at 10 pm.  15 mL  11  . Insulin Pen Needle (NOVOFINE) 30G X 8 MM MISC Inject 10 each into the skin as needed.  160 each  0  . labetalol (NORMODYNE) 200 MG tablet Take 1 tablet (200 mg total) by mouth 2 (two) times daily.      . polysaccharide iron (NIFEREX) 150 MG CAPS capsule Take 150 mg by mouth daily.        . pravastatin (PRAVACHOL) 20 MG tablet Take 20 mg by mouth at bedtime.        . prazosin (MINIPRESS) 5 MG capsule Take 10 mg by mouth at bedtime.        . vancomycin (VANCOCIN) 1 GM/200ML SOLN Inject 200 mLs (1,000 mg total) into the vein every Monday, Wednesday, and Friday with hemodialysis.  4000 mL     No current facility-administered medications on file prior to visit.   Past Surgical History  Procedure Laterality Date  . Abdominal hysterectomy    . Insertion of dialysis catheter N/A 01/19/2014    Procedure: INSERTION OF DIALYSIS CATHETER;  Surgeon: Rosetta Posner, MD;  Location: [place];  Service: Vascular;  Laterality: N/A;  . Av fistula placement Left 01/27/2014    Procedure: ARTERIOVENOUS (AV) FISTULA CREATION;  Surgeon: Mal Misty, MD;  Location: [facility] OR;  Service: Vascular;  Laterality: Left;    Physical exam BP 148/60  Pulse 88  Temp(Src) 98.2 F (36.8 C)  Resp 18  SpO2 95%  Constitutional: She is oriented to person, place, and time. No distress. obese HENT:   Head: Normocephalic and atraumatic.  Mouth/Throat: Oropharynx is clear and moist.   Neck: Normal range of motion. Neck supple. No JVD present.   Cardiovascular: Normal rate, regular rhythm, normal heart sounds and intact distal pulses.   Breasts- a small pin point size raised follicle area under left breast skin fold with serous drainage. Mild erythema on surrounding skin area with drainage on pressing on  surrounding area. Discomfort present, denies tenderness. Normal temperature Pulmonary/Chest: Effort normal and breath sounds normal. No respiratory distress.   Abdominal: Soft. Bowel sounds are normal. She exhibits no distension and no mass. There is no tenderness.  Musculoskeletal: Normal range of motion.   Skin: Skin is warm and dry. Left arm AV fistula. See above Psychiatric: She has a normal mood and affect.   Assessment/plan  Folliculitis- Clean with saline and apply dry gauze Treat with doxycycline 100 mg bid for 10 days Add florastor 250 mg bid for 2 weeks Reassess if no improvement  Constipation- improved with colace. Continue this

## 2014-02-15 DIAGNOSIS — T7840XA Allergy, unspecified, initial encounter: Secondary | ICD-10-CM | POA: Insufficient documentation

## 2014-02-25 ENCOUNTER — Non-Acute Institutional Stay (SKILLED_NURSING_FACILITY): Payer: Medicare Other | Admitting: Internal Medicine

## 2014-02-25 DIAGNOSIS — Z992 Dependence on renal dialysis: Secondary | ICD-10-CM

## 2014-02-25 DIAGNOSIS — K5900 Constipation, unspecified: Secondary | ICD-10-CM

## 2014-02-25 DIAGNOSIS — K3184 Gastroparesis: Secondary | ICD-10-CM

## 2014-02-25 DIAGNOSIS — I10 Essential (primary) hypertension: Secondary | ICD-10-CM

## 2014-02-25 DIAGNOSIS — D649 Anemia, unspecified: Secondary | ICD-10-CM

## 2014-02-25 DIAGNOSIS — N186 End stage renal disease: Secondary | ICD-10-CM

## 2014-02-25 DIAGNOSIS — E119 Type 2 diabetes mellitus without complications: Secondary | ICD-10-CM

## 2014-02-25 NOTE — Progress Notes (Signed)
Patient ID: Jacqueline [name], female   DOB: 18-Oct-1946, 67 y.o.   MRN: [id number]     Armandina Gemma living [employer]  Chief Complaint  Patient presents with  . Discharge Note    disharge visit   Allergies  Allergen Reactions  . Sulfa Drugs Cross Reactors Other (See Comments)    Doesn't remember    HPI 68 y/o female patient is here for STR after hospital admission with renal failure and pneumonia. She is on dialysis. She has worked with therapy team in the facility and is now stable to be discharged home with home health services. She denies any complaints this visit. No concerns from staff    Review of Systems   Constitutional: negative for fever, chills. Energy level has improved Eyes: Negative for blurred vision.   Respiratory: Negative for shortness of breath.    Cardiovascular: Negative for chest pain.   Gastrointestinal: Positive for heartburn and nausea. No further vomiting. reglan has been helpful Genitourinary: Negative for dysuria.   Musculoskeletal: Negative for falls and myalgias.   Neurological: no focal weakness, headache Psychiatric/Behavioral: Negative for memory loss.   Past Medical History  Diagnosis Date  . Hypertension   . Diabetes mellitus   . Hyperlipidemia   . Obesity    Past Surgical History  Procedure Laterality Date  . Abdominal hysterectomy    . Insertion of dialysis catheter N/A 01/19/2014    Procedure: INSERTION OF DIALYSIS CATHETER;  Surgeon: Rosetta Posner, MD;  Location: [place];  Service: Vascular;  Laterality: N/A;  . Av fistula placement Left 01/27/2014    Procedure: ARTERIOVENOUS (AV) FISTULA CREATION;  Surgeon: Mal Misty, MD;  Location: [place];  Service: Vascular;  Laterality: Left;   Medication reviewed. See [facility]  Physical exam BP 140/62  Pulse 72  Temp(Src) 97.5 F (36.4 C)  Resp 18  Ht 5\' 5"  (1.651 m)  Wt 297 lb (134.718 kg)  BMI 49.42 kg/m2  SpO2 90%  Constitutional: She is oriented to person, place, and time. No distress.     HENT:   Head: Normocephalic and atraumatic.  Mouth/Throat: Oropharynx is clear and moist.   Neck: Normal range of motion. Neck supple. No JVD present.   Cardiovascular: Normal rate, regular rhythm, normal heart sounds and intact distal pulses.   Pulmonary/Chest: Effort normal and breath sounds normal. No respiratory distress.   Abdominal: Soft. Bowel sounds are normal. She exhibits no distension and no mass. There is no tenderness.  Musculoskeletal: Normal range of motion.  Neurological: She is alert and oriented to person, place, and time.   Skin: Skin is warm and dry. Left arm AV fistula Psychiatric: She has a normal mood and affect.    Assessment/plan  She is stable to be discharged home with home health services for physical and occupational therapy. DME script for walker and bariatric wheelchair with elevating leg rest will be provided. She will be discharged home on continuous oxygen 2l/min. She will need o2 concentrator and tubing. She has not injected insulin before. Thus, she will also need RN services for diabetic education  Dyspnea- Albuterol MDI 2 puff q6h prn  Constipation- Docusate has been helpful with bowel movement. Will provide script for colace bid  Hyperlipidemia- Continue fish oil 1000 mg daily with pravastatin 20 mg daily  Iron def anemia- Continue iron complex 150 mg daily for her anemia  Hypertension- Labetalol 200 mg bid, prazosin 10 mg daily,   Dm type 2- lantus 28 u sq at bedtime, novolog 5  u premeal for cbg > 150 only, glucometer, test strips and lancets, check cbg four times a day with meals and at bedtime  Gastroparesis- Omeprazole 20 mg daily, reglan 10 mg bid  ESRD- continue dialysis 3 days /week  Spent more than 30 minutes in discharge planning

## 2014-02-28 DIAGNOSIS — E46 Unspecified protein-calorie malnutrition: Secondary | ICD-10-CM | POA: Insufficient documentation

## 2014-03-01 ENCOUNTER — Encounter (HOSPITAL_BASED_OUTPATIENT_CLINIC_OR_DEPARTMENT_OTHER): Payer: Medicare Other

## 2014-03-14 ENCOUNTER — Encounter: Payer: Self-pay | Admitting: Vascular Surgery

## 2014-03-15 ENCOUNTER — Encounter: Payer: Self-pay | Admitting: Vascular Surgery

## 2014-03-15 ENCOUNTER — Ambulatory Visit (HOSPITAL_COMMUNITY)
Admission: RE | Admit: 2014-03-15 | Discharge: 2014-03-15 | Disposition: A | Discharge status: Home / Self Care | Payer: Medicare Other | Source: Ambulatory Visit | Attending: Vascular Surgery | Admitting: Vascular Surgery

## 2014-03-15 ENCOUNTER — Ambulatory Visit (INDEPENDENT_AMBULATORY_CARE_PROVIDER_SITE_OTHER): Payer: Self-pay | Admitting: Vascular Surgery

## 2014-03-15 VITALS — BP 155/65 | HR 67 | Resp 16 | Ht 65.0 in | Wt 286.2 lb

## 2014-03-15 DIAGNOSIS — N186 End stage renal disease: Secondary | ICD-10-CM

## 2014-03-15 DIAGNOSIS — Z4931 Encounter for adequacy testing for hemodialysis: Secondary | ICD-10-CM

## 2014-03-15 NOTE — Progress Notes (Signed)
Subjective:     Patient ID: Jacqueline [name], female   DOB: 1946-04-03, 68 y.o.   MRN: [id number]  HPI this 68 year old female sensate renal disease had a left brachial to cephalic AV fistula created by me on 01/27/2014. She is currently being dialyzed through a temporary catheter in the right IJ. She denies any pain or numbness in the left hand other than while on dialysis when she gets and tingling in the left fourth and fifth digits which is not severe.   Review of Systems     Objective:   Physical Exam BP 155/65  Pulse 67  Resp 16  Ht 5\' 5"  (1.651 m)  Wt 286 lb 2.5 oz (129.8 kg)  BMI 47.62 kg/m2  Left upper arm examined. Incision nicely healed. Excellent pulse and palpable thrill in the brachial to cephalic AV fistula. One to 2+ radial pulse palpable distally.  Today I ordered a duplex scan of the dialysis fistula in the left upper arm. This was widely patent. It is of good caliber and not too deep. There is one isolated branch extending laterally in the mid upper arm but the diameter is only 0.35 cm.      Assessment:     Widely patent left brachial to cephalic A-V fistula with excellent pulse palpable thrill and good flow-should make good site for vascular access    Plan:     Okay to use the fistula following 04/26/2014  Do not think this one isolated branch in the upper arm needs ligation at this time. Her flows are not satisfactory we could ligate this single branch but it is not very large

## 2014-03-25 ENCOUNTER — Other Ambulatory Visit: Payer: Self-pay | Admitting: Internal Medicine

## 2014-03-31 ENCOUNTER — Other Ambulatory Visit: Payer: Self-pay | Admitting: Internal Medicine

## 2014-04-01 ENCOUNTER — Ambulatory Visit (HOSPITAL_BASED_OUTPATIENT_CLINIC_OR_DEPARTMENT_OTHER): Payer: Medicare Other | Attending: Internal Medicine

## 2014-04-15 ENCOUNTER — Other Ambulatory Visit: Payer: Self-pay | Admitting: Internal Medicine

## 2014-06-06 ENCOUNTER — Emergency Department (INDEPENDENT_AMBULATORY_CARE_PROVIDER_SITE_OTHER)
Admission: EM | Admit: 2014-06-06 | Discharge: 2014-06-06 | Disposition: A | Discharge status: Home / Self Care | Payer: Medicare Other | Source: Home / Self Care

## 2014-06-06 ENCOUNTER — Encounter (HOSPITAL_COMMUNITY): Payer: Self-pay | Admitting: Emergency Medicine

## 2014-06-06 DIAGNOSIS — R42 Dizziness and giddiness: Secondary | ICD-10-CM

## 2014-06-06 MED ORDER — MECLIZINE HCL 25 MG PO TABS: ORAL_TABLET | ORAL | Status: DC
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
## 2014-06-06 NOTE — Discharge Instructions (Signed)
Benign Positional Vertigo Vertigo means you feel like you or your surroundings are moving when they are not. Benign positional vertigo is the most common form of vertigo. Benign means that the cause of your condition is not serious. Benign positional vertigo is more common in older adults. CAUSES  Benign positional vertigo is the result of an upset in the labyrinth system. This is an area in the middle ear that helps control your balance. This may be caused by a viral infection, head injury, or repetitive motion. However, often no specific cause is found. SYMPTOMS  Symptoms of benign positional vertigo occur when you move your head or eyes in different directions. Some of the symptoms may include:  Loss of balance and falls.  Vomiting.  Blurred vision.  Dizziness.  Nausea.  Involuntary eye movements (nystagmus). DIAGNOSIS  Benign positional vertigo is usually diagnosed by physical exam. If the specific cause of your benign positional vertigo is unknown, your caregiver may perform imaging tests, such as magnetic resonance imaging (MRI) or computed tomography (CT). TREATMENT  Your caregiver may recommend movements or procedures to correct the benign positional vertigo. Medicines such as meclizine, benzodiazepines, and medicines for nausea may be used to treat your symptoms. In rare cases, if your symptoms are caused by certain conditions that affect the inner ear, you may need surgery. HOME CARE INSTRUCTIONS   Follow your caregiver's instructions.  Move slowly. Do not make sudden body or head movements.  Avoid driving.  Avoid operating heavy machinery.  Avoid performing any tasks that would be dangerous to you or others during a vertigo episode.  Drink enough fluids to keep your urine clear or pale yellow. SEEK IMMEDIATE MEDICAL CARE IF:   You develop problems with walking, weakness, numbness, or using your arms, hands, or legs.  You have difficulty speaking.  You develop  severe headaches.  Your nausea or vomiting continues or gets worse.  You develop visual changes.  Your family or friends notice any behavioral changes.  Your condition gets worse.  You have a fever.  You develop a stiff neck or sensitivity to light. MAKE SURE YOU:   Understand these instructions.  Will watch your condition.  Will get help right away if you are not doing well or get worse. Document Released: 08/26/2006 Document Revised: 02/10/2012 Document Reviewed: 08/08/2011 [facility] Patient Information 2015 [address], [place]. This information is not intended to replace advice given to you by your health care provider. Make sure you discuss any questions you have with your health care provider.  Vertigo Vertigo means you feel like you are moving when you are not. Vertigo can make you feel like things around you are moving when they are not. This problem often goes away on its own.  HOME CARE   Follow your doctor's instructions.  Avoid driving.  Avoid using heavy machinery.  Avoid doing any activity that could be dangerous if you have a vertigo attack.  Tell your doctor if a medicine seems to cause your vertigo. GET HELP RIGHT AWAY IF:   Your medicines do not help or make you feel worse.  You have trouble talking or walking.  You feel weak or have trouble using your arms, hands, or legs.  You have bad headaches.  You keep feeling sick to your stomach (nauseous) or throwing up (vomiting).  Your vision changes.  A family member notices changes in your behavior.  Your problems get worse. MAKE SURE YOU:  Understand these instructions.  Will watch your condition.  Will get help right away if you are not doing well or get worse. Document Released: 08/27/2008 Document Revised: 02/10/2012 Document Reviewed: 06/06/2011 [facility] Patient Information 2015 [place], [place]. This information is not intended to replace advice given to you by your health care provider. Make  sure you discuss any questions you have with your health care provider.

## 2014-06-06 NOTE — ED Notes (Signed)
Patient reports 3 day history of episodes of dizziness: tilting head, looking up, going from lying to standing or standing to lying down,.denies any pain, specifically no chest pain.  Patient had dialysis saturday

## 2014-06-07 NOTE — ED Provider Notes (Signed)
Medical screening examination/treatment/procedure(s) were performed by non-physician practitioner and as supervising physician I was immediately available for consultation/collaboration.  Philipp Deputy, M.D.  Harden Mo, MD 06/07/14 [phone]

## 2014-06-10 DIAGNOSIS — Z23 Encounter for immunization: Secondary | ICD-10-CM | POA: Insufficient documentation

## 2014-08-26 ENCOUNTER — Encounter: Payer: Self-pay | Admitting: Podiatry

## 2014-08-26 ENCOUNTER — Ambulatory Visit (INDEPENDENT_AMBULATORY_CARE_PROVIDER_SITE_OTHER): Payer: Medicare Other | Admitting: Podiatry

## 2014-08-26 VITALS — Ht 65.0 in | Wt 263.0 lb

## 2014-08-26 DIAGNOSIS — M79606 Pain in leg, unspecified: Secondary | ICD-10-CM | POA: Insufficient documentation

## 2014-08-26 DIAGNOSIS — B351 Tinea unguium: Secondary | ICD-10-CM

## 2014-08-26 DIAGNOSIS — M79609 Pain in unspecified limb: Secondary | ICD-10-CM

## 2014-08-26 NOTE — Patient Instructions (Signed)
Seen for hypertrophic nails. All nails debrided. Return in 3 months or as needed.  

## 2014-08-26 NOTE — Progress Notes (Signed)
Subjective:  68 y.o. year old female patient presents aided by a cane complaining of painful nails. Patient requests toe nails trimmed.  Stated that her blood sugar reading was at 200 level two days ago.  Was in Hospital due to kidney failure. Getting Dialysis since February 2015.   Objective: Dermatologic: Thick yellow deformed nails x 10.  Dark discolorated dorsal skin foot and leg. Vascular: Pedal pulses are all palpable.Hyperpigmented forefoot and rear foot.  Orthopedic: Contracted lesser digits bilateral.  Neurologic: All epicritic and tactile sensations grossly intact.   Assessment:  Dystrophic mycotic nails x 10.  Painful feet from thick nails.   Treatment: All mycotic nails debrided.  Return in 3 months or as needed.

## 2014-11-28 ENCOUNTER — Ambulatory Visit: Payer: Medicare Other | Admitting: Podiatry

## 2014-11-30 DIAGNOSIS — Z4802 Encounter for removal of sutures: Secondary | ICD-10-CM | POA: Insufficient documentation

## 2014-12-09 ENCOUNTER — Encounter: Payer: Self-pay | Admitting: Podiatry

## 2014-12-09 ENCOUNTER — Ambulatory Visit (INDEPENDENT_AMBULATORY_CARE_PROVIDER_SITE_OTHER): Payer: Medicare Other | Admitting: Podiatry

## 2014-12-09 DIAGNOSIS — M79606 Pain in leg, unspecified: Secondary | ICD-10-CM | POA: Diagnosis not present

## 2014-12-09 DIAGNOSIS — B351 Tinea unguium: Secondary | ICD-10-CM | POA: Diagnosis not present

## 2014-12-09 NOTE — Progress Notes (Signed)
Subjective:  69 y.o. year old female patient presents complaining of painful nails. Patient requests toe nails trimmed.  Getting Dialysis since February 2015.   Objective: Dermatologic: Thick yellow deformed nails x 10.  Dark discolorated dorsal skin foot and leg. Vascular: Pedal pulses are all palpable.Hyperpigmented forefoot and rear foot.  Orthopedic: Contracted lesser digits bilateral.  Neurologic: All epicritic and tactile sensations grossly intact.   Assessment:  Dystrophic mycotic nails x 10.  Painful feet from thick nails.   Treatment: All mycotic nails debrided.  Return in 3 months or as needed.

## 2014-12-09 NOTE — Patient Instructions (Signed)
Seen for hypertrophic nails. All nails debrided. Return in 3 months or as needed.  

## 2014-12-16 DIAGNOSIS — N186 End stage renal disease: Secondary | ICD-10-CM | POA: Diagnosis not present

## 2014-12-16 DIAGNOSIS — R5383 Other fatigue: Secondary | ICD-10-CM | POA: Diagnosis not present

## 2014-12-16 DIAGNOSIS — E782 Mixed hyperlipidemia: Secondary | ICD-10-CM | POA: Diagnosis not present

## 2014-12-16 DIAGNOSIS — I120 Hypertensive chronic kidney disease with stage 5 chronic kidney disease or end stage renal disease: Secondary | ICD-10-CM | POA: Diagnosis not present

## 2014-12-16 DIAGNOSIS — E1122 Type 2 diabetes mellitus with diabetic chronic kidney disease: Secondary | ICD-10-CM | POA: Diagnosis not present

## 2014-12-16 DIAGNOSIS — E669 Obesity, unspecified: Secondary | ICD-10-CM | POA: Diagnosis not present

## 2014-12-31 DIAGNOSIS — N186 End stage renal disease: Secondary | ICD-10-CM | POA: Diagnosis not present

## 2014-12-31 DIAGNOSIS — N2581 Secondary hyperparathyroidism of renal origin: Secondary | ICD-10-CM | POA: Diagnosis not present

## 2014-12-31 DIAGNOSIS — D689 Coagulation defect, unspecified: Secondary | ICD-10-CM | POA: Diagnosis not present

## 2014-12-31 DIAGNOSIS — D509 Iron deficiency anemia, unspecified: Secondary | ICD-10-CM | POA: Diagnosis not present

## 2015-01-01 DIAGNOSIS — I509 Heart failure, unspecified: Secondary | ICD-10-CM | POA: Diagnosis not present

## 2015-01-01 DIAGNOSIS — Z992 Dependence on renal dialysis: Secondary | ICD-10-CM | POA: Diagnosis not present

## 2015-01-01 DIAGNOSIS — N186 End stage renal disease: Secondary | ICD-10-CM | POA: Diagnosis not present

## 2015-01-06 DIAGNOSIS — Z961 Presence of intraocular lens: Secondary | ICD-10-CM | POA: Diagnosis not present

## 2015-01-06 DIAGNOSIS — H25811 Combined forms of age-related cataract, right eye: Secondary | ICD-10-CM | POA: Diagnosis not present

## 2015-01-06 DIAGNOSIS — E11359 Type 2 diabetes mellitus with proliferative diabetic retinopathy without macular edema: Secondary | ICD-10-CM | POA: Diagnosis not present

## 2015-01-24 DIAGNOSIS — D689 Coagulation defect, unspecified: Secondary | ICD-10-CM | POA: Diagnosis not present

## 2015-01-24 DIAGNOSIS — D509 Iron deficiency anemia, unspecified: Secondary | ICD-10-CM | POA: Diagnosis not present

## 2015-01-24 DIAGNOSIS — N186 End stage renal disease: Secondary | ICD-10-CM | POA: Diagnosis not present

## 2015-01-24 DIAGNOSIS — N2581 Secondary hyperparathyroidism of renal origin: Secondary | ICD-10-CM | POA: Diagnosis not present

## 2015-01-28 DIAGNOSIS — N2581 Secondary hyperparathyroidism of renal origin: Secondary | ICD-10-CM | POA: Diagnosis not present

## 2015-01-28 DIAGNOSIS — D689 Coagulation defect, unspecified: Secondary | ICD-10-CM | POA: Diagnosis not present

## 2015-01-28 DIAGNOSIS — N186 End stage renal disease: Secondary | ICD-10-CM | POA: Diagnosis not present

## 2015-01-28 DIAGNOSIS — D509 Iron deficiency anemia, unspecified: Secondary | ICD-10-CM | POA: Diagnosis not present

## 2015-01-30 DIAGNOSIS — Z992 Dependence on renal dialysis: Secondary | ICD-10-CM | POA: Diagnosis not present

## 2015-01-30 DIAGNOSIS — I509 Heart failure, unspecified: Secondary | ICD-10-CM | POA: Diagnosis not present

## 2015-01-30 DIAGNOSIS — N186 End stage renal disease: Secondary | ICD-10-CM | POA: Diagnosis not present

## 2015-03-10 ENCOUNTER — Ambulatory Visit: Payer: BC Managed Care – PPO | Admitting: Podiatry

## 2015-03-27 DIAGNOSIS — L299 Pruritus, unspecified: Secondary | ICD-10-CM | POA: Insufficient documentation

## 2015-04-07 ENCOUNTER — Encounter: Payer: Self-pay | Admitting: Podiatry

## 2015-04-07 ENCOUNTER — Ambulatory Visit (INDEPENDENT_AMBULATORY_CARE_PROVIDER_SITE_OTHER): Payer: Medicare Other | Admitting: Podiatry

## 2015-04-07 VITALS — BP 155/64 | HR 64

## 2015-04-07 DIAGNOSIS — M79606 Pain in leg, unspecified: Secondary | ICD-10-CM | POA: Diagnosis not present

## 2015-04-07 DIAGNOSIS — B351 Tinea unguium: Secondary | ICD-10-CM

## 2015-04-07 NOTE — Patient Instructions (Signed)
Seen for hypertrophic nails. All nails debrided. Return in 3 months or as needed.  

## 2015-04-07 NOTE — Progress Notes (Signed)
Subjective:  69 y.o. year old female patient presents complaining of problematicl nails and hurts to walk. Patient requests toe nails trimmed.  Patient is on Dialysis since February 2015.   Objective: Dermatologic: Thick yellow deformed nails x 10.  Dark discolorated dorsal skin foot and leg. Vascular: Pedal pulses are all palpable.Hyperpigmented forefoot and rear foot.  Orthopedic: Contracted lesser digits bilateral.  Neurologic: All epicritic and tactile sensations grossly intact.   Assessment:  Dystrophic mycotic nails x 10.  Painful feet from thick nails.   Treatment: All mycotic nails debrided.  Return in 3 months or as needed.

## 2015-07-07 ENCOUNTER — Ambulatory Visit (INDEPENDENT_AMBULATORY_CARE_PROVIDER_SITE_OTHER): Payer: BC Managed Care – PPO | Admitting: Podiatry

## 2015-07-07 ENCOUNTER — Encounter: Payer: Self-pay | Admitting: Podiatry

## 2015-07-07 VITALS — BP 153/63 | HR 61

## 2015-07-07 DIAGNOSIS — M79606 Pain in leg, unspecified: Secondary | ICD-10-CM | POA: Diagnosis not present

## 2015-07-07 DIAGNOSIS — B351 Tinea unguium: Secondary | ICD-10-CM

## 2015-07-07 NOTE — Patient Instructions (Signed)
Seen for hypertrophic nails. All nails debrided. Return in 3 months or as needed.  

## 2015-07-07 NOTE — Progress Notes (Signed)
Subjective:  69 y.o. year old female patient presents complaining of problematicl nails and hurts to walk. Patient requests toe nails trimmed.  Patient is on Dialysis since February 2015.   Objective: Dermatologic: Thick yellow deformed nails x 10.  Dark discolorated dorsal skin foot and leg. Vascular: Pedal pulses are all palpable.Hyperpigmented forefoot and rear foot.  Orthopedic: Contracted lesser digits bilateral.  Neurologic: All epicritic and tactile sensations grossly intact.   Assessment:  Dystrophic mycotic nails x 10.  Painful feet from thick nails.   Treatment: All mycotic nails debrided.  Return in 3 months or as needed.

## 2015-07-24 ENCOUNTER — Other Ambulatory Visit: Payer: Self-pay | Admitting: *Deleted

## 2015-07-24 DIAGNOSIS — R202 Paresthesia of skin: Secondary | ICD-10-CM

## 2015-07-31 ENCOUNTER — Encounter: Payer: Self-pay | Admitting: Vascular Surgery

## 2015-08-01 ENCOUNTER — Ambulatory Visit (HOSPITAL_COMMUNITY)
Admission: RE | Admit: 2015-08-01 | Discharge: 2015-08-01 | Disposition: A | Discharge status: Home / Self Care | Payer: Medicare Other | Source: Ambulatory Visit | Attending: Vascular Surgery | Admitting: Vascular Surgery

## 2015-08-01 ENCOUNTER — Ambulatory Visit (INDEPENDENT_AMBULATORY_CARE_PROVIDER_SITE_OTHER): Payer: Medicare Other | Admitting: Vascular Surgery

## 2015-08-01 ENCOUNTER — Encounter: Payer: Self-pay | Admitting: Vascular Surgery

## 2015-08-01 VITALS — BP 212/71 | HR 63 | Temp 98.7°F | Ht 65.0 in | Wt 234.5 lb

## 2015-08-01 DIAGNOSIS — Z87891 Personal history of nicotine dependence: Secondary | ICD-10-CM | POA: Diagnosis not present

## 2015-08-01 DIAGNOSIS — I10 Essential (primary) hypertension: Secondary | ICD-10-CM | POA: Insufficient documentation

## 2015-08-01 DIAGNOSIS — N186 End stage renal disease: Secondary | ICD-10-CM

## 2015-08-01 DIAGNOSIS — E785 Hyperlipidemia, unspecified: Secondary | ICD-10-CM | POA: Insufficient documentation

## 2015-08-01 DIAGNOSIS — R202 Paresthesia of skin: Secondary | ICD-10-CM | POA: Diagnosis not present

## 2015-08-01 DIAGNOSIS — E119 Type 2 diabetes mellitus without complications: Secondary | ICD-10-CM | POA: Diagnosis not present

## 2015-08-01 NOTE — Progress Notes (Signed)
Subjective:     Patient ID: Jacqueline [name], female   DOB: [birth year], 69 y.o.   MRN: [id number]  HPI This 69 year old female was referred by Dr.Coladanada  For evaluation of possible steal syndrome left hand. This patient had a left brachial-cephalic AV fistula created by me in February 2015. She is on hemodialysis and the fistula functions nicely. She has had some chronic tingling in the left fourth and fifth fingers which worsens when she is on dialysis. She denies any numbness in digits 1 through 3. She does not have aching discomfort in the left hand on a chronic basis but does state that her left hand function is not as good as it used to be.   Past Medical History  Diagnosis Date  . Hypertension   . Diabetes mellitus   . Hyperlipidemia   . Obesity   . Chronic kidney disease     Social History  Substance Use Topics  . Smoking status: Never Smoker   . Smokeless tobacco: Never Used  . Alcohol Use: No    Family History  Problem Relation Age of Onset  . Diabetes Mother   . Peripheral vascular disease Mother     amputation  . CAD Father   . Heart disease Father   . Hypertension Father   . Heart attack Father   . CAD Sister     Allergies  Allergen Reactions  . Sulfa Drugs Cross Reactors Other (See Comments)    Doesn't remember      Current outpatient prescriptions:  .  albuterol (PROVENTIL HFA;VENTOLIN HFA) 108 (90 BASE) MCG/ACT inhaler, Inhale 2 puffs into the lungs every 6 (six) hours as needed for wheezing or shortness of breath., Disp: , Rfl:  .  b complex vitamins capsule, Take 1 capsule by mouth daily., Disp: , Rfl:  .  cholecalciferol (VITAMIN D) 1000 UNITS tablet, Take 2,000 Units by mouth daily., Disp: , Rfl:  .  docusate sodium 100 MG CAPS, Take 200 mg by mouth 2 (two) times daily., Disp: 10 capsule, Rfl: 0 .  fish oil-omega-3 fatty acids 1000 MG capsule, Take 1 g by mouth daily.  , Disp: , Rfl:  .  insulin aspart (NOVOLOG FLEXPEN) 100 UNIT/ML FlexPen, Inject 3  Units into the skin 3 (three) times daily with meals., Disp: 15 mL, Rfl: 11 .  labetalol (NORMODYNE) 200 MG tablet, Take 1 tablet (200 mg total) by mouth 2 (two) times daily., Disp: , Rfl:  .  meclizine (ANTIVERT) 25 MG tablet, Take 1/2 tablet q 6h prn vertigo, Disp: 28 tablet, Rfl: 0 .  pravastatin (PRAVACHOL) 20 MG tablet, Take 20 mg by mouth at bedtime.  , Disp: , Rfl:  .  prazosin (MINIPRESS) 5 MG capsule, Take 10 mg by mouth at bedtime.  , Disp: , Rfl:  .  Garlic [id number] MG CAPS, Take 2 capsules by mouth daily.  , Disp: , Rfl:  .  Insulin Glargine (LANTUS SOLOSTAR) 100 UNIT/ML Solostar Pen, Inject 28 Units into the skin daily at 10 pm. (Patient not taking: Reported on 08/01/2015), Disp: 15 mL, Rfl: 11 .  Insulin Pen Needle (NOVOFINE) 30G X 8 MM MISC, Inject 10 each into the skin as needed. (Patient not taking: Reported on 08/01/2015), Disp: 160 each, Rfl: 0 .  polysaccharide iron (NIFEREX) 150 MG CAPS capsule, Take 150 mg by mouth daily.  , Disp: , Rfl:   Filed Vitals:   08/01/15 1050  BP: 212/71  Pulse: 63  Temp: 98.7 F (37.1  C)  TempSrc: Oral  Height: 5\' 5"  (1.651 m)  Weight: 234 lb 8 oz (106.369 kg)  SpO2: 93%    Body mass index is 39.02 kg/(m^2).           Review of Systems Denies chest pain, dyspnea on exertion, PND, orthopnea, hemoptysis     Objective:   Physical Exam BP 212/71 mmHg  Pulse 63  Temp(Src) 98.7 F (37.1 C) (Oral)  Ht 5\' 5"  (1.651 m)  Wt 234 lb 8 oz (106.369 kg)  BMI 39.02 kg/m2  SpO2 93%   general obese female no apparent distress alert and oriented 3 Lungs no rhonchi or wheezingLeft upper extremity with excellent pulse and palpable thrill and brachialcephalic AV fistula. 2+ radial pulse palpable with fistula patent which improves to 3+ with compression of the fistula. Left hand is a can adequately perfused with good sensation and motor function. Her graft today I ordered an arterial study of the left upper extremity with and without compression.  There is biphasic flow to the left hand with the fistula patent. The resting radial pressure is 162 mmHg which does improve to 280 with compression of the fistula.     Assessment:      there is some element of steal  With improvement of the blood pressure in the left hand with compression of the fistula. However there is adequate biphasic flow with the fistula patent and the distribution of numbness is more consistent with ulnar nerve entrapment than ischemia since it only involves the fourth and fifth digits  I do not  Thank that a steal syndrome is responsible for her symptoms and she has no interest in ligating the fistula.     Plan:      could consider referral to orthopedic surgeon or hand surgeon to rule out ulnar nerve entrapment  Return to see me on when necessary basis

## 2015-10-05 ENCOUNTER — Ambulatory Visit: Payer: BC Managed Care – PPO | Admitting: Podiatry

## 2015-10-06 ENCOUNTER — Ambulatory Visit: Payer: BC Managed Care – PPO | Admitting: Podiatry

## 2015-10-11 ENCOUNTER — Ambulatory Visit (INDEPENDENT_AMBULATORY_CARE_PROVIDER_SITE_OTHER): Payer: BC Managed Care – PPO | Admitting: Podiatry

## 2015-10-11 ENCOUNTER — Encounter: Payer: Self-pay | Admitting: Podiatry

## 2015-10-11 VITALS — BP 170/70 | HR 65

## 2015-10-11 DIAGNOSIS — B351 Tinea unguium: Secondary | ICD-10-CM | POA: Diagnosis not present

## 2015-10-11 DIAGNOSIS — M79606 Pain in leg, unspecified: Secondary | ICD-10-CM | POA: Diagnosis not present

## 2015-10-11 NOTE — Progress Notes (Signed)
Subjective:  69 y.o. year old female patient presents complaining of problematicl nails and hurts to walk.  Patient requests toe nails trimmed.  Patient is on Dialysis since February 2015.   Objective: Dermatologic: Thick yellow deformed nails x 10.  Dark discolorated and dry skin foot and leg bilateral. Vascular: Pedal pulses are all palpable.Hyperpigmented forefoot and rear foot.  Orthopedic: Contracted lesser digits bilateral.  Neurologic: All epicritic and tactile sensations grossly intact.   Assessment:  Dystrophic mycotic nails x 10.  Painful feet from thick nails.   Treatment: All mycotic nails debrided.  Return in 3 months or as needed.

## 2015-10-11 NOTE — Patient Instructions (Signed)
Seen for hypertrophic nails. All nails debrided. Return in 3 months or as needed.  

## 2016-01-10 ENCOUNTER — Encounter: Payer: Self-pay | Admitting: Podiatry

## 2016-01-10 ENCOUNTER — Ambulatory Visit (INDEPENDENT_AMBULATORY_CARE_PROVIDER_SITE_OTHER): Payer: Medicare Other | Admitting: Podiatry

## 2016-01-10 VITALS — BP 125/56 | HR 59

## 2016-01-10 DIAGNOSIS — B351 Tinea unguium: Secondary | ICD-10-CM | POA: Diagnosis not present

## 2016-01-10 DIAGNOSIS — M79606 Pain in leg, unspecified: Secondary | ICD-10-CM | POA: Diagnosis not present

## 2016-01-10 NOTE — Progress Notes (Signed)
Subjective:  70 y.o. year old female patient presents accompanied by her husband complaining of problematicl nails and hurts to walk.  Patient requests toe nails trimmed. Patient is on Dialysis since February 2015.   Objective: Dermatologic: Thick yellow deformed nails x 10.  Dark discolorated and dry skin foot and leg bilateral. Vascular: Pedal pulses are all palpable.Hyperpigmented forefoot and rear foot.  Orthopedic: Contracted lesser digits bilateral.  Neurologic: All epicritic and tactile sensations grossly intact.   Assessment:  Dystrophic mycotic nails x 10.  Painful feet from thick nails.   Treatment: All mycotic nails debrided.  Return in 3 months or as needed.

## 2016-01-10 NOTE — Patient Instructions (Signed)
Seen for hypertrophic nails. All nails debrided. Return in 3 months or as needed.  

## 2016-04-10 ENCOUNTER — Ambulatory Visit: Payer: BC Managed Care – PPO | Admitting: Podiatry

## 2016-04-17 ENCOUNTER — Encounter: Payer: Self-pay | Admitting: Podiatry

## 2016-04-17 ENCOUNTER — Ambulatory Visit (INDEPENDENT_AMBULATORY_CARE_PROVIDER_SITE_OTHER): Payer: Medicare Other | Admitting: Podiatry

## 2016-04-17 VITALS — BP 175/80 | HR 64

## 2016-04-17 DIAGNOSIS — M79606 Pain in leg, unspecified: Secondary | ICD-10-CM

## 2016-04-17 DIAGNOSIS — B351 Tinea unguium: Secondary | ICD-10-CM

## 2016-04-17 DIAGNOSIS — L97521 Non-pressure chronic ulcer of other part of left foot limited to breakdown of skin: Secondary | ICD-10-CM | POA: Diagnosis not present

## 2016-04-17 NOTE — Progress Notes (Signed)
Subjective:  70 y.o. year old female patient presents accompanied by her husband complaining of severe toe pain on 4th digit left with pain in all nails.Patient requests toe nails trimmed.  Patient is on Dialysis since February 2015.   Objective: Dermatologic:  Painful hard porokeratotic lesion with intradermal bleeding at distal lateral aspect of 4th digit left. Thick yellow deformed nails x 10.  Dark discolorated and dry skin foot and leg bilateral. Vascular: Pedal pulses are all palpable.Hyperpigmented forefoot and rear foot.  Orthopedic: Contracted lesser digits bilateral.  Varus rotated 4th digit left with digital pain. Neurologic: All epicritic and tactile sensations grossly intact.   Assessment:  Dystrophic mycotic nails x 10.  Ulcerating corn 4th digit left. Painful feet from thick nails.   Treatment: All mycotic nails debrided.  Debrided 4th digital lesion and Buttress pad placed with instruction.  Return in 6 weeks or sooner if needed.

## 2016-04-17 NOTE — Patient Instructions (Signed)
Seen for hypertrophic nails and painful toe 4th left. Noted of pre ulcerative lesion at 4th digit left. All nails debrided. Lesion debrided and padded. Return in one month for follow up on 4th digital lesion left foot.

## 2016-05-03 DIAGNOSIS — E876 Hypokalemia: Secondary | ICD-10-CM | POA: Insufficient documentation

## 2016-05-29 ENCOUNTER — Ambulatory Visit: Payer: BC Managed Care – PPO | Admitting: Podiatry

## 2016-11-13 ENCOUNTER — Encounter: Payer: Self-pay | Admitting: Podiatry

## 2016-11-13 ENCOUNTER — Ambulatory Visit (INDEPENDENT_AMBULATORY_CARE_PROVIDER_SITE_OTHER): Payer: Medicare Other | Admitting: Podiatry

## 2016-11-13 VITALS — BP 161/74 | HR 64

## 2016-11-13 DIAGNOSIS — M79671 Pain in right foot: Secondary | ICD-10-CM | POA: Diagnosis not present

## 2016-11-13 DIAGNOSIS — B351 Tinea unguium: Secondary | ICD-10-CM

## 2016-11-13 DIAGNOSIS — M79672 Pain in left foot: Secondary | ICD-10-CM

## 2016-11-13 NOTE — Patient Instructions (Signed)
Seen for hypertrophic nails. All nails debrided. Return in 3 months or as needed.  

## 2016-11-13 NOTE — Progress Notes (Signed)
Subjective:  70 y.o. year old female patient presents accompanied by her husband requesting toe nails trimmed.  Patient is on Dialysis since February 2015.  A1c was 5.6, last blood sugar was 103.   Objective: Dermatologic:  Thick protruding plantar tyloma under left great toe. Thick yellow deformed nails x 10.  Dark discolorated and dry skin foot and leg bilateral. Vascular: Pedal pulses are not palpable.Hyperpigmented forefoot and rear foot.  Orthopedic: Contracted lesser digits bilateral.  Neurologic: All epicritic and tactile sensations grossly intact.   Assessment:  Dystrophic mycotic nails x 10.  Plantar tyloma left great toe. Painful feet from thick nails.   Treatment: All mycotic nails debrided.  Debrided left great toe lesion. Return in 3 month.

## 2016-12-03 ENCOUNTER — Emergency Department (HOSPITAL_COMMUNITY): Payer: Medicare Other

## 2016-12-03 ENCOUNTER — Encounter (HOSPITAL_COMMUNITY): Payer: Self-pay

## 2016-12-03 ENCOUNTER — Inpatient Hospital Stay (HOSPITAL_COMMUNITY)
Admission: EM | Admit: 2016-12-03 | Discharge: 2016-12-05 | DRG: 246 | Disposition: A | Discharge status: Home / Self Care | Payer: Medicare Other | Attending: Internal Medicine | Admitting: Internal Medicine

## 2016-12-03 DIAGNOSIS — I2510 Atherosclerotic heart disease of native coronary artery without angina pectoris: Secondary | ICD-10-CM | POA: Diagnosis present

## 2016-12-03 DIAGNOSIS — Z992 Dependence on renal dialysis: Secondary | ICD-10-CM

## 2016-12-03 DIAGNOSIS — Z794 Long term (current) use of insulin: Secondary | ICD-10-CM

## 2016-12-03 DIAGNOSIS — N2581 Secondary hyperparathyroidism of renal origin: Secondary | ICD-10-CM | POA: Diagnosis not present

## 2016-12-03 DIAGNOSIS — R079 Chest pain, unspecified: Secondary | ICD-10-CM | POA: Diagnosis not present

## 2016-12-03 DIAGNOSIS — Z9861 Coronary angioplasty status: Secondary | ICD-10-CM

## 2016-12-03 DIAGNOSIS — R072 Precordial pain: Secondary | ICD-10-CM | POA: Diagnosis not present

## 2016-12-03 DIAGNOSIS — Z880 Allergy status to penicillin: Secondary | ICD-10-CM

## 2016-12-03 DIAGNOSIS — I120 Hypertensive chronic kidney disease with stage 5 chronic kidney disease or end stage renal disease: Secondary | ICD-10-CM | POA: Diagnosis present

## 2016-12-03 DIAGNOSIS — I214 Non-ST elevation (NSTEMI) myocardial infarction: Principal | ICD-10-CM | POA: Diagnosis present

## 2016-12-03 DIAGNOSIS — R110 Nausea: Secondary | ICD-10-CM

## 2016-12-03 DIAGNOSIS — E1122 Type 2 diabetes mellitus with diabetic chronic kidney disease: Secondary | ICD-10-CM | POA: Diagnosis present

## 2016-12-03 DIAGNOSIS — K219 Gastro-esophageal reflux disease without esophagitis: Secondary | ICD-10-CM | POA: Diagnosis present

## 2016-12-03 DIAGNOSIS — Z8249 Family history of ischemic heart disease and other diseases of the circulatory system: Secondary | ICD-10-CM

## 2016-12-03 DIAGNOSIS — N186 End stage renal disease: Secondary | ICD-10-CM | POA: Diagnosis not present

## 2016-12-03 DIAGNOSIS — Z833 Family history of diabetes mellitus: Secondary | ICD-10-CM

## 2016-12-03 DIAGNOSIS — E669 Obesity, unspecified: Secondary | ICD-10-CM | POA: Diagnosis present

## 2016-12-03 DIAGNOSIS — G4733 Obstructive sleep apnea (adult) (pediatric): Secondary | ICD-10-CM | POA: Diagnosis present

## 2016-12-03 DIAGNOSIS — I252 Old myocardial infarction: Secondary | ICD-10-CM

## 2016-12-03 DIAGNOSIS — I10 Essential (primary) hypertension: Secondary | ICD-10-CM | POA: Diagnosis present

## 2016-12-03 DIAGNOSIS — Z955 Presence of coronary angioplasty implant and graft: Secondary | ICD-10-CM

## 2016-12-03 DIAGNOSIS — E785 Hyperlipidemia, unspecified: Secondary | ICD-10-CM | POA: Diagnosis present

## 2016-12-03 DIAGNOSIS — E118 Type 2 diabetes mellitus with unspecified complications: Secondary | ICD-10-CM

## 2016-12-03 DIAGNOSIS — R001 Bradycardia, unspecified: Secondary | ICD-10-CM | POA: Diagnosis present

## 2016-12-03 DIAGNOSIS — Z6838 Body mass index (BMI) 38.0-38.9, adult: Secondary | ICD-10-CM

## 2016-12-03 HISTORY — DX: Pneumonia, unspecified organism: J18.9

## 2016-12-03 HISTORY — DX: End stage renal disease: Z99.2

## 2016-12-03 HISTORY — DX: Low back pain: M54.5

## 2016-12-03 HISTORY — DX: End stage renal disease: N18.6

## 2016-12-03 HISTORY — DX: Other chronic pain: G89.29

## 2016-12-03 HISTORY — DX: Gastro-esophageal reflux disease without esophagitis: K21.9

## 2016-12-03 HISTORY — DX: Unspecified osteoarthritis, unspecified site: M19.90

## 2016-12-03 HISTORY — DX: Low back pain, unspecified: M54.50

## 2016-12-03 HISTORY — DX: Type 2 diabetes mellitus without complications: E11.9

## 2016-12-03 LAB — BASIC METABOLIC PANEL
Anion gap: 11 (ref 5–15)
BUN: 19 mg/dL (ref 6–20)
CALCIUM: 8.8 mg/dL — AB (ref 8.9–10.3)
CO2: 32 mmol/L (ref 22–32)
CREATININE: 5.67 mg/dL — AB (ref 0.44–1.00)
Chloride: 98 mmol/L — ABNORMAL LOW (ref 101–111)
GFR, EST AFRICAN AMERICAN: 8 mL/min — AB (ref >60)
GFR, EST NON AFRICAN AMERICAN: 7 mL/min — AB (ref >60)
GLUCOSE: 93 mg/dL (ref 65–99)
Potassium: 3.4 mmol/L — ABNORMAL LOW (ref 3.5–5.1)
Sodium: 141 mmol/L (ref 135–145)

## 2016-12-03 LAB — CBC WITH DIFFERENTIAL/PLATELET
Basophils Absolute: 0.1 10*3/uL (ref 0.0–0.1)
Basophils Relative: 1 %
Eosinophils Absolute: 0.1 10*3/uL (ref 0.0–0.7)
Eosinophils Relative: 1 %
HEMATOCRIT: 36.3 % (ref 36.0–46.0)
HEMOGLOBIN: 11.7 g/dL — AB (ref 12.0–15.0)
LYMPHS ABS: 1.8 10*3/uL (ref 0.7–4.0)
Lymphocytes Relative: 15 %
MCH: 31.1 pg (ref 26.0–34.0)
MCHC: 32.2 g/dL (ref 30.0–36.0)
MCV: 96.5 fL (ref 78.0–100.0)
MONO ABS: 0.8 10*3/uL (ref 0.1–1.0)
MONOS PCT: 7 %
NEUTROS ABS: 9.2 10*3/uL — AB (ref 1.7–7.7)
NEUTROS PCT: 76 %
Platelets: 197 10*3/uL (ref 150–400)
RBC: 3.76 MIL/uL — ABNORMAL LOW (ref 3.87–5.11)
RDW: 13.6 % (ref 11.5–15.5)
WBC: 11.9 10*3/uL — ABNORMAL HIGH (ref 4.0–10.5)

## 2016-12-03 LAB — CREATININE, SERUM
CREATININE: 6.03 mg/dL — AB (ref 0.44–1.00)
GFR calc Af Amer: 7 mL/min — ABNORMAL LOW (ref >60)
GFR calc non Af Amer: 6 mL/min — ABNORMAL LOW (ref >60)

## 2016-12-03 LAB — CBC
HCT: 35.2 % — ABNORMAL LOW (ref 36.0–46.0)
Hemoglobin: 11.5 g/dL — ABNORMAL LOW (ref 12.0–15.0)
MCH: 30.9 pg (ref 26.0–34.0)
MCHC: 32.7 g/dL (ref 30.0–36.0)
MCV: 94.6 fL (ref 78.0–100.0)
PLATELETS: 203 10*3/uL (ref 150–400)
RBC: 3.72 MIL/uL — ABNORMAL LOW (ref 3.87–5.11)
RDW: 13.3 % (ref 11.5–15.5)
WBC: 10.9 10*3/uL — ABNORMAL HIGH (ref 4.0–10.5)

## 2016-12-03 LAB — TROPONIN I
Troponin I: 3 ng/mL (ref <0.03)
Troponin I: 2.69 ng/mL (ref <0.03)

## 2016-12-03 LAB — I-STAT TROPONIN, ED: Troponin i, poc: 0.03 ng/mL (ref 0.00–0.08)

## 2016-12-03 LAB — GLUCOSE, CAPILLARY: Glucose-Capillary: 208 mg/dL — ABNORMAL HIGH (ref 65–99)

## 2016-12-03 MED ORDER — ACETAMINOPHEN 325 MG PO TABS: 650.0000 mg | ORAL_TABLET | ORAL | Status: DC | PRN

## 2016-12-03 MED ORDER — ASPIRIN 300 MG RE SUPP: 300.0000 mg | RECTAL | Status: AC

## 2016-12-03 MED ORDER — NITROGLYCERIN 0.4 MG SL SUBL: 0.4000 mg | SUBLINGUAL_TABLET | SUBLINGUAL | Status: DC | PRN

## 2016-12-03 MED ORDER — ONDANSETRON HCL 4 MG/2ML IJ SOLN: 4.0000 mg | Freq: Four times a day (QID) | INTRAMUSCULAR | Status: DC | PRN

## 2016-12-03 MED ORDER — ONDANSETRON HCL 4 MG/2ML IJ SOLN
4.0000 mg | Freq: Once | INTRAMUSCULAR | Status: AC
  Administered 2016-12-03: 4 mg via INTRAVENOUS
  Filled 2016-12-03: qty 2

## 2016-12-03 MED ORDER — ASPIRIN 81 MG PO CHEW
324.0000 mg | CHEWABLE_TABLET | Freq: Once | ORAL | Status: AC
  Administered 2016-12-03: 324 mg via ORAL
  Filled 2016-12-03: qty 4

## 2016-12-03 MED ORDER — DOCUSATE SODIUM 100 MG PO CAPS
100.0000 mg | ORAL_CAPSULE | Freq: Every day | ORAL | Status: DC
  Administered 2016-12-03: 100 mg via ORAL
  Filled 2016-12-03: qty 1

## 2016-12-03 MED ORDER — PANTOPRAZOLE SODIUM 40 MG PO TBEC
40.0000 mg | DELAYED_RELEASE_TABLET | Freq: Every day | ORAL | Status: DC
  Administered 2016-12-04 – 2016-12-05 (×2): 40 mg via ORAL
  Filled 2016-12-03 (×2): qty 1

## 2016-12-03 MED ORDER — ENOXAPARIN SODIUM 40 MG/0.4ML ~~LOC~~ SOLN
40.0000 mg | SUBCUTANEOUS | Status: DC
  Administered 2016-12-03: 40 mg via SUBCUTANEOUS
  Filled 2016-12-03: qty 0.4

## 2016-12-03 MED ORDER — CALCIUM ACETATE (PHOS BINDER) 667 MG PO CAPS
667.0000 mg | ORAL_CAPSULE | Freq: Three times a day (TID) | ORAL | Status: DC
  Administered 2016-12-04 – 2016-12-05 (×4): 667 mg via ORAL
  Filled 2016-12-03 (×5): qty 1

## 2016-12-03 MED ORDER — ASPIRIN 81 MG PO CHEW: 324.0000 mg | CHEWABLE_TABLET | ORAL | Status: AC

## 2016-12-03 MED ORDER — ALUM & MAG HYDROXIDE-SIMETH 200-200-20 MG/5ML PO SUSP
30.0000 mL | Freq: Once | ORAL | Status: AC
  Administered 2016-12-03: 30 mL via ORAL
  Filled 2016-12-03: qty 30

## 2016-12-03 MED ORDER — ASPIRIN EC 81 MG PO TBEC: 81.0000 mg | DELAYED_RELEASE_TABLET | Freq: Every day | ORAL | Status: DC

## 2016-12-03 MED ORDER — CALCIUM CARBONATE ANTACID 500 MG PO CHEW
2.0000 | CHEWABLE_TABLET | Freq: Once | ORAL | Status: AC
  Administered 2016-12-03: 400 mg via ORAL
  Filled 2016-12-03: qty 2

## 2016-12-03 MED ORDER — OMEGA-3 FATTY ACIDS 1000 MG PO CAPS: 1.0000 g | ORAL_CAPSULE | Freq: Every day | ORAL | Status: DC

## 2016-12-03 MED ORDER — HEPARIN (PORCINE) IN NACL 100-0.45 UNIT/ML-% IJ SOLN
1050.0000 [IU]/h | INTRAMUSCULAR | Status: DC
  Administered 2016-12-03: 1150 [IU]/h via INTRAVENOUS
  Filled 2016-12-03: qty 250

## 2016-12-03 MED ORDER — OMEGA-3-ACID ETHYL ESTERS 1 G PO CAPS
1.0000 g | ORAL_CAPSULE | Freq: Every day | ORAL | Status: DC
  Administered 2016-12-04 – 2016-12-05 (×2): 1 g via ORAL
  Filled 2016-12-03 (×2): qty 1

## 2016-12-03 NOTE — Progress Notes (Signed)
[facility] Brief Note  Pt here under OBS status for CP evaluation. Had HD today. If still here on Thursday and requires HD, please contact our service.  Jamal Maes, MD [facility] [phone] Pager 12/03/2016, 7:15 PM

## 2016-12-03 NOTE — H&P (Signed)
History and Physical    Jacqueline [name]:[id number] DOB: [birth year] DOA: 12/03/2016    PCP: Maximino Greenland, MD  Patient coming from: home  Chief Complaint: chest pressure  HPI: Jacqueline [name] is a 71 y.o. female with medical history significant of ESRD,   HTN obesity who presents with chest pain. She states that she had completed her dialysis session today and was waiting to be picked up by her husband when she noted chest discomfort that she describes as tightness. She states it was associated with nausea and shortness of breath. She went back into the dialysis center and was placed on oxygen and EMS was called. She states the discomfort lasted for at least 15 minutes and resolved on its own. She has not had pressure like this before. No complaints of feeling palpitations during the episode. Discomfort did not radiate to jaw or arm. Does admit to having occasional reflux where food sometimes cuts into her mouth. She states it occurs more frequently when she forgets to take her medication. Episode today did not feel like her reflux. No complaints of epigastric pain.  ED Course: EKG non-acute, Troponin normal  Review of Systems:  All other systems reviewed and apart from HPI, are negative.  Past Medical History:  Diagnosis Date  . Chronic kidney disease   . Hyperlipidemia   . Hypertension   . Obesity     Past Surgical History:  Procedure Laterality Date  . ABDOMINAL HYSTERECTOMY    . AV FISTULA PLACEMENT Left 01/27/2014   Procedure: ARTERIOVENOUS (AV) FISTULA CREATION;  Surgeon: Mal Misty, MD;  Location: [place];  Service: Vascular;  Laterality: Left;  . INSERTION OF DIALYSIS CATHETER N/A 01/19/2014   Procedure: INSERTION OF DIALYSIS CATHETER;  Surgeon: Rosetta Posner, MD;  Location: [place];  Service: Vascular;  Laterality: N/A;    Social History:   reports that she has never smoked. She has never used smokeless tobacco. She reports that she does not drink alcohol or use  drugs.  Allergies  Allergen Reactions  . Sulfa Drugs Cross Reactors Other (See Comments)    Doesn't remember     Family History  Problem Relation Age of Onset  . Diabetes Mother   . Peripheral vascular disease Mother     amputation  . CAD Father   . Heart disease Father   . Hypertension Father   . Heart attack Father   . CAD Sister      Prior to Admission medications   Medication Sig Start Date End Date Taking? Authorizing Provider  b complex vitamins capsule Take 1 capsule by mouth daily.   Yes Historical Provider, MD  calcium acetate (PHOSLO) 667 MG capsule Take 667 mg by mouth See admin instructions. Take 667 mg by mouth 3 times daily with meals and take 667 mg by mouth with snacks. 10/21/16  Yes Historical Provider, MD  docusate sodium 100 MG CAPS Take 200 mg by mouth 2 (two) times daily. Patient taking differently: Take 100 mg by mouth at bedtime.  01/28/14  Yes Debbe Odea, MD  fish oil-omega-3 fatty acids 1000 MG capsule Take 1 g by mouth daily.     Yes Historical Provider, MD  meclizine (ANTIVERT) 25 MG tablet Take 1/2 tablet q 6h prn vertigo Patient taking differently: Take 25 mg by mouth every 6 (six) hours as needed for dizziness or nausea.  06/06/14  Yes Janne Napoleon, NP  omeprazole (PRILOSEC) 20 MG capsule Take 20 mg by mouth daily.  10/23/16  Yes Historical Provider, MD   Physical Exam: Vitals:   12/03/16 1245 12/03/16 1300 12/03/16 1315 12/03/16 1631  BP: 118/58 112/57 123/66 (!) 123/45  Pulse: 75  95 60  Resp: (!) 27 22 (!) 32 19  Temp:    98.3 F (36.8 C)  TempSrc:    Oral  SpO2: (!) 78%  100% 98%  Weight:      Height:          Constitutional: NAD, calm, comfortable Eyes: PERTLA, lids and conjunctivae normal ENMT: Mucous membranes are moist. Posterior pharynx clear of any exudate or lesions. Normal dentition.  Neck: normal, supple, no masses, no thyromegaly Respiratory: clear to auscultation bilaterally, no wheezing, no crackles. Normal respiratory  effort. No accessory muscle use.  Cardiovascular: S1 & S2 heard, regular rate and rhythm, no murmurs / rubs / gallops. No extremity edema. 2+ pedal pulses. No carotid bruits.  Abdomen: No distension, no tenderness, no masses palpated. No hepatosplenomegaly. Bowel sounds normal.  Musculoskeletal: no clubbing / cyanosis. No joint deformity upper and lower extremities. Good ROM, no contractures. Normal muscle tone.  Skin: no rashes, lesions, ulcers. No induration Neurologic: CN 2-12 grossly intact. Sensation intact, DTR normal. Strength 5/5 in all 4 limbs.  Psychiatric: Normal judgment and insight. Alert and oriented x 3. Normal mood.     Labs on Admission: I have personally reviewed following labs and imaging studies  CBC:  Recent Labs Lab 12/03/16 1445  WBC 11.9*  NEUTROABS 9.2*  HGB 11.7*  HCT 36.3  MCV 96.5  PLT 174   Basic Metabolic Panel:  Recent Labs Lab 12/03/16 1445  NA 141  K 3.4*  CL 98*  CO2 32  GLUCOSE 93  BUN 19  CREATININE 5.67*  CALCIUM 8.8*   GFR: Estimated Creatinine Clearance: 10.7 mL/min (by C-G formula based on SCr of 5.67 mg/dL (H)). Liver Function Tests: No results for input(s): AST, ALT, ALKPHOS, BILITOT, PROT, ALBUMIN in the last 168 hours. No results for input(s): LIPASE, AMYLASE in the last 168 hours. No results for input(s): AMMONIA in the last 168 hours. Coagulation Profile: No results for input(s): INR, PROTIME in the last 168 hours. Cardiac Enzymes: No results for input(s): CKTOTAL, CKMB, CKMBINDEX, TROPONINI in the last 168 hours. BNP (last 3 results) No results for input(s): PROBNP in the last 8760 hours. HbA1C: No results for input(s): HGBA1C in the last 72 hours. CBG: No results for input(s): GLUCAP in the last 168 hours. Lipid Profile: No results for input(s): CHOL, HDL, LDLCALC, TRIG, CHOLHDL, LDLDIRECT in the last 72 hours. Thyroid Function Tests: No results for input(s): TSH, T4TOTAL, FREET4, T3FREE, THYROIDAB in the last 72  hours. Anemia Panel: No results for input(s): VITAMINB12, FOLATE, FERRITIN, TIBC, IRON, RETICCTPCT in the last 72 hours. Urine analysis:    Component Value Date/Time   COLORURINE YELLOW 01/25/2014 1903   APPEARANCEUR TURBID (A) 01/25/2014 1903   LABSPEC 1.020 01/25/2014 1903   PHURINE 5.0 01/25/2014 1903   GLUCOSEU NEGATIVE 01/25/2014 1903   HGBUR LARGE (A) 01/25/2014 1903   BILIRUBINUR SMALL (A) 01/25/2014 1903   KETONESUR NEGATIVE 01/25/2014 1903   PROTEINUR 100 (A) 01/25/2014 1903   UROBILINOGEN 1.0 01/25/2014 1903   NITRITE NEGATIVE 01/25/2014 1903   LEUKOCYTESUR LARGE (A) 01/25/2014 1903   Sepsis Labs: @LABRCNTIP (procalcitonin:4,lacticidven:4) )No results found for this or any previous visit (from the past 240 hour(s)).   Radiological Exams on Admission: Dg Chest 2 View  Result Date: 12/03/2016 CLINICAL DATA:  Chest pressure. Chronic  renal failure. Cardiac arrhythmia EXAM: CHEST  2 VIEW COMPARISON:  January 25, 2014 FINDINGS: There is no edema or consolidation. Heart is upper normal in size with pulmonary vascularity within normal limits. No adenopathy. There is atherosclerotic calcification in the aorta. There is degenerative change in the thoracic spine peer IMPRESSION: Aortic atherosclerosis.  No edema or consolidation. Electronically Signed   By: Lowella Grip III M.D.   On: 12/03/2016 13:42    EKG: Independently reviewed.Sinus rhythm with PACs-no ST or T-wave changes.  Assessment/Plan Principal Problem:   Chest Pressure -Pressure sensation associated with dyspnea and nausea occurring for 15 minutes and resolving on its own -In regards to risk factors, she states her A1c has recently been around 5 without medications-she does not smoke-it appears her blood pressure is controlled and she is not on any antihypertensives-she states she has been told in the past that she has hyperlipidemia but is not on medications-she is overweight -We'll admit and rule out for MI with 3  sets of troponin -Obtain fasting lipid panel and A1c -can consider obtaining a stress test in the morning if she does rule out  Active Problems: Gastroesophageal reflux disease -Continue PPI daily    ESRD (end stage renal disease) on dialysis -Underwent dialysis today-if she is here on Thursday, will need to consult nephrology for dialysis    OSA (obstructive sleep apnea) -We'll order C Pap    Secondary renal hyperparathyroidism -Continue home medications  DVT prophylaxis: lovenox Code Status: Full code  Family Communication: husband  Disposition Plan: home when stable  Consults called: none  Admission status: observation    [facility] MD [facility] Pager: [email or web address] Password [email or web address] 7PM-7AM, please contact night-coverage   12/03/2016, 4:36 PM

## 2016-12-03 NOTE — ED Triage Notes (Signed)
Transported via EMS. Per EMS patient completed dialysis and then began having Nausea and 5/10 chest pressure. Returned back to Dialysis center for assessment. At dialysis patient was having PACs and possible AFib, no history of either. BP 108/52, CBG 168, HR 77, SpO2 96%.

## 2016-12-03 NOTE — Progress Notes (Signed)
Patient arrived from ED, lab called with Trop 3.0, paged Dr. Wynelle Cleveland, patient has no complaints of pain, BP 144/82, NSR 65. Will continue to monitor.

## 2016-12-03 NOTE — Consult Note (Signed)
Admit date: 12/03/2016 Referring Physician  Dr. Wynelle Cleveland Primary Physician  Dr. Glendale Chard, MD Primary Cardiologist  none Reason for Consultation  Chest pain  HPI: This is a 71yo female with a history of ESRD on HD, hyperlipidemia, HTN and obesity who presented to the ER with complaints of CP.  She was at HD and waiting for her husband to pick her up and developed chest discomfort described as a pressure sensation across her chest associated with SOB and nausea.  She went back inside the HD center and was placed on O2.  EMS was called and by the time she arrived in the ER her pain was gone.  The pain lasted about 15 minutes.  She has no prior cardiac history but does have a history of GERD.  Her initial trop PCO at 3pm was normal at 0.03 in the ER but subsequent troponin increased to 3.  She has not had any further CP since admission.  Cardiology is now asked to consult.  She denies any prior heart problems or CP prior to todays event.      PMH:   Past Medical History:  Diagnosis Date  . Chronic kidney disease   . Diabetes mellitus   . Hyperlipidemia   . Hypertension   . Obesity      PSH:   Past Surgical History:  Procedure Laterality Date  . ABDOMINAL HYSTERECTOMY    . AV FISTULA PLACEMENT Left 01/27/2014   Procedure: ARTERIOVENOUS (AV) FISTULA CREATION;  Surgeon: Mal Misty, MD;  Location: [place];  Service: Vascular;  Laterality: Left;  . INSERTION OF DIALYSIS CATHETER N/A 01/19/2014   Procedure: INSERTION OF DIALYSIS CATHETER;  Surgeon: Rosetta Posner, MD;  Location: [facility];  Service: Vascular;  Laterality: N/A;    Allergies:  Sulfa drugs cross reactors Prior to Admit Meds:   Prescriptions Prior to Admission  Medication Sig Dispense Refill Last Dose  . b complex vitamins capsule Take 1 capsule by mouth daily.   12/02/2016 at Unknown time  . calcium acetate (PHOSLO) 667 MG capsule Take 667 mg by mouth See admin instructions. Take 667 mg by mouth 3 times daily with meals and take  667 mg by mouth with snacks.  10 12/02/2016 at Unknown time  . docusate sodium 100 MG CAPS Take 200 mg by mouth 2 (two) times daily. (Patient taking differently: Take 100 mg by mouth at bedtime. ) 10 capsule 0 Past Week at Unknown time  . fish oil-omega-3 fatty acids 1000 MG capsule Take 1 g by mouth daily.     12/02/2016 at Unknown time  . meclizine (ANTIVERT) 25 MG tablet Take 1/2 tablet q 6h prn vertigo (Patient taking differently: Take 25 mg by mouth every 6 (six) hours as needed for dizziness or nausea. ) 28 tablet 0 12/02/2016 at Unknown time  . omeprazole (PRILOSEC) 20 MG capsule Take 20 mg by mouth daily.  3 12/02/2016 at Unknown time  . albuterol (PROVENTIL HFA;VENTOLIN HFA) 108 (90 BASE) MCG/ACT inhaler Inhale 2 puffs into the lungs every 6 (six) hours as needed for wheezing or shortness of breath.   Unknown at Unknown  . insulin aspart (NOVOLOG FLEXPEN) 100 UNIT/ML FlexPen Inject 3 Units into the skin 3 (three) times daily with meals. (Patient not taking: Reported on 12/03/2016) 15 mL 11 Not Taking at Unknown time  . Insulin Glargine (LANTUS SOLOSTAR) 100 UNIT/ML Solostar Pen Inject 28 Units into the skin daily at 10 pm. (Patient not taking: Reported on  12/03/2016) 15 mL 11 Not Taking at Unknown time  . Insulin Pen Needle (NOVOFINE) 30G X 8 MM MISC Inject 10 each into the skin as needed. (Patient not taking: Reported on 12/03/2016) 160 each 0 Not Taking at Unknown time  . labetalol (NORMODYNE) 200 MG tablet Take 1 tablet (200 mg total) by mouth 2 (two) times daily. (Patient not taking: Reported on 12/03/2016)   Not Taking at Unknown time   Fam HX:    Family History  Problem Relation Age of Onset  . Diabetes Mother   . Peripheral vascular disease Mother     amputation  . CAD Father   . Heart disease Father   . Hypertension Father   . Heart attack Father   . CAD Sister    Social HX:    Social History   Social History  . Marital status: Married    Spouse name: N/A  . Number of children: N/A  .  Years of education: N/A   Occupational History  . Not on file.   Social History Main Topics  . Smoking status: Never Smoker  . Smokeless tobacco: Never Used  . Alcohol use No  . Drug use: No  . Sexual activity: No   Other Topics Concern  . Not on file   Social History Narrative  . No narrative on file     ROS:  All 11 ROS were addressed and are negative except what is stated in the HPI  Physical Exam: Blood pressure 113/69, pulse 64, temperature 98.7 F (37.1 C), temperature source Oral, resp. rate 18, height 5\' 5"  (1.651 m), weight 229 lb 6.4 oz (104.1 kg), SpO2 94 %.    General: Well developed, well nourished, in no acute distress Head: Eyes PERRLA, No xanthomas.   Normal cephalic and atramatic  Lungs:   Clear bilaterally to auscultation and percussion. Heart:   HRRR S1 S2 Pulses are 2+ & equal.            No carotid bruit. No JVD.  No abdominal bruits. No femoral bruits. Abdomen: Bowel sounds are positive, abdomen soft and non-tender without masses  Msk:  Back normal, normal gait. Normal strength and tone for age. Extremities:   No clubbing, cyanosis or edema.  DP +1 Neuro: Alert and oriented X 3. Psych:  Good affect, responds appropriately    Labs:   Lab Results  Component Value Date   WBC 10.9 (H) 12/03/2016   HGB 11.5 (L) 12/03/2016   HCT 35.2 (L) 12/03/2016   MCV 94.6 12/03/2016   PLT 203 12/03/2016    Recent Labs Lab 12/03/16 1445 12/03/16 1755  NA 141  --   K 3.4*  --   CL 98*  --   CO2 32  --   BUN 19  --   CREATININE 5.67* 6.03*  CALCIUM 8.8*  --   GLUCOSE 93  --    No results found for: PTT Lab Results  Component Value Date   INR 1.07 01/27/2014   Lab Results  Component Value Date   TROPONINI 3.00 ([place]) 12/03/2016    No results found for: CHOL No results found for: HDL No results found for: LDLCALC No results found for: TRIG No results found for: CHOLHDL No results found for: LDLDIRECT    Radiology:  Dg Chest 2 View  Result  Date: 12/03/2016 CLINICAL DATA:  Chest pressure. Chronic renal failure. Cardiac arrhythmia EXAM: CHEST  2 VIEW COMPARISON:  January 25, 2014 FINDINGS: There is no  edema or consolidation. Heart is upper normal in size with pulmonary vascularity within normal limits. No adenopathy. There is atherosclerotic calcification in the aorta. There is degenerative change in the thoracic spine peer IMPRESSION: Aortic atherosclerosis.  No edema or consolidation. Electronically Signed   By: Lowella Grip III M.D.   On: 12/03/2016 13:42    EKG:  NSR with septal infarct and low voltage.  No acute ST changes  ASSESSMENT/PLAN:   1.  NSTEMI with elevated trop at 3 after only 15 minutes of CP right after HD.  EKG is nonischemic.  She has not had any further CP.  Agree with IV heparin gtt.  Continue ASA.  No BB due to bradycardia.  Start high dose statin.  Will make NPO for cath to define coronary anatomy. Cardiac catheterization was discussed with the patient fully. The patient understands that risks include but are not limited to stroke (1 in 1000), death (1 in 78), kidney failure [usually temporary] (1 in 500), bleeding (1 in 200), allergic reaction [possibly serious] (1 in 200).  The patient understands and is willing to proceed.    2.  HTN - BP controlled  3.  Hyperlipidemia - currently on no meds.  Will check an FLP in am and start high dose statin.   4.  GERD - continue PPI  5.  ESRD on HD - per nephrology  Fransico Him, MD  12/03/2016  10:37 PM

## 2016-12-03 NOTE — ED Provider Notes (Signed)
[place] DEPT Provider Note   CSN: [id number] Arrival date & time: 12/03/16 1201     History    Chief Complaint  Patient presents with  . Chest Pain     HPI Jacqueline [name] is a 71 y.o. female.  71yo F w/ PMH including ESRD on HD T/Th/Sat who p/w chest pressure and nausea. Pt went to routine dialysis appt today and after session was finished, she began having central, non-radiating, 5/10 in intensity chest pressure. She reports associated nausea and shortness of breath, no diaphoresis or vomiting. Her chest pain resolved spontaneously after approximately 15-20 minutes and she denies any chest pressure currently. She has never had this chest pain before. She currently endorses ongoing nausea and reflux symptoms. She denies any fever, cough/cold symptoms, or recent illness. No problems with leg swelling. She is compliant with her medications.   Past Medical History:  Diagnosis Date  . Chronic kidney disease   . Diabetes mellitus   . Hyperlipidemia   . Hypertension   . Obesity      Patient Active Problem List   Diagnosis Date Noted  . Chest pain 12/03/2016  . Pain in lower limb 08/26/2014  . End stage renal disease ([place]) 03/15/2014  . HCAP (healthcare-associated pneumonia) 02/13/2014  . Secondary hyperparathyroidism (of renal origin) 01/28/2014  . OSA (obstructive sleep apnea) 01/21/2014  . 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[place]) 01/21/2014  . Acute respiratory failure ([place]) 01/19/2014  . Acute encephalopathy 01/19/2014  . Congestive heart failure ([place]) 01/15/2014  . ESRD (end stage renal disease) on dialysis ([place]) 01/15/2014  . Hypertension 01/15/2014  . Diabetes mellitus ([place]) 01/15/2014  . Anemia 01/15/2014  . Acute diastolic CHF (congestive heart failure) ([place]) 01/15/2014  . Onychomycosis 03/26/2013  . Pain in joint, ankle and foot 03/26/2013    Past Surgical History:  Procedure Laterality Date  . ABDOMINAL HYSTERECTOMY    . AV FISTULA PLACEMENT Left  01/27/2014   Procedure: ARTERIOVENOUS (AV) FISTULA CREATION;  Surgeon: Mal Misty, MD;  Location: [place];  Service: Vascular;  Laterality: Left;  . INSERTION OF DIALYSIS CATHETER N/A 01/19/2014   Procedure: INSERTION OF DIALYSIS CATHETER;  Surgeon: Rosetta Posner, MD;  Location: [facility] OR;  Service: Vascular;  Laterality: N/A;    OB History    No data available        Home Medications    Prior to Admission medications   Medication Sig Start Date End Date Taking? Authorizing Provider  b complex vitamins capsule Take 1 capsule by mouth daily.   Yes Historical Provider, MD  calcium acetate (PHOSLO) 667 MG capsule Take 667 mg by mouth See admin instructions. Take 667 mg by mouth 3 times daily with meals and take 667 mg by mouth with snacks. 10/21/16  Yes Historical Provider, MD  docusate sodium 100 MG CAPS Take 200 mg by mouth 2 (two) times daily. Patient taking differently: Take 100 mg by mouth at bedtime.  01/28/14  Yes Debbe Odea, MD  fish oil-omega-3 fatty acids 1000 MG capsule Take 1 g by mouth daily.     Yes Historical Provider, MD  meclizine (ANTIVERT) 25 MG tablet Take 1/2 tablet q 6h prn vertigo Patient taking differently: Take 25 mg by mouth every 6 (six) hours as needed for dizziness or nausea.  06/06/14  Yes Janne Napoleon, NP  omeprazole (PRILOSEC) 20 MG capsule Take 20 mg by mouth daily. 10/23/16  Yes Historical Provider, MD  albuterol (PROVENTIL HFA;VENTOLIN HFA) 108 (90 BASE) MCG/ACT inhaler Inhale 2  puffs into the lungs every 6 (six) hours as needed for wheezing or shortness of breath.    Historical Provider, MD  insulin aspart (NOVOLOG FLEXPEN) 100 UNIT/ML FlexPen Inject 3 Units into the skin 3 (three) times daily with meals. Patient not taking: Reported on 12/03/2016 01/28/14   Debbe Odea, MD  Insulin Glargine (LANTUS SOLOSTAR) 100 UNIT/ML Solostar Pen Inject 28 Units into the skin daily at 10 pm. Patient not taking: Reported on 12/03/2016 01/28/14   Debbe Odea, MD  Insulin Pen  Needle (NOVOFINE) 30G X 8 MM MISC Inject 10 each into the skin as needed. Patient not taking: Reported on 12/03/2016 01/28/14   Debbe Odea, MD  labetalol (NORMODYNE) 200 MG tablet Take 1 tablet (200 mg total) by mouth 2 (two) times daily. Patient not taking: Reported on 12/03/2016 01/28/14   Debbe Odea, MD      Family History  Problem Relation Age of Onset  . Diabetes Mother   . Peripheral vascular disease Mother     amputation  . CAD Father   . Heart disease Father   . Hypertension Father   . Heart attack Father   . CAD Sister      Social History  Substance Use Topics  . Smoking status: Never Smoker  . Smokeless tobacco: Never Used  . Alcohol use No     Allergies     Sulfa drugs cross reactors    Review of Systems  10 Systems reviewed and are negative for acute change except as noted in the HPI.   Physical Exam Updated Vital Signs BP 123/66   Pulse 95   Temp 99.2 F (37.3 C) (Oral)   Resp (!) 32   Ht 5\' 5"  (1.651 m)   Wt 229 lb 4.5 oz (104 kg)   SpO2 100%   BMI 38.15 kg/m   Physical Exam  Constitutional: She is oriented to person, place, and time. She appears well-developed and well-nourished. No distress.  HENT:  Head: Normocephalic and atraumatic.  Moist mucous membranes  Eyes: Conjunctivae are normal. Pupils are equal, round, and reactive to light.  Neck: Neck supple.  Cardiovascular: Normal rate, regular rhythm and normal heart sounds.   No murmur heard. Pulmonary/Chest: Effort normal and breath sounds normal.  Abdominal: Soft. Bowel sounds are normal. She exhibits no distension. There is no tenderness.  Musculoskeletal: She exhibits no edema.  Neurological: She is alert and oriented to person, place, and time.  Fluent speech  Skin: Skin is warm and dry.  Psychiatric: She has a normal mood and affect. Judgment normal.  Nursing note and vitals reviewed.     ED Treatments / Results  Labs (all labs ordered are listed, but only abnormal  results are displayed) Labs Reviewed  BASIC METABOLIC PANEL - Abnormal; Notable for the following:       Result Value   Potassium 3.4 (*)    Chloride 98 (*)    Creatinine, Ser 5.67 (*)    Calcium 8.8 (*)    GFR calc non Af Amer 7 (*)    GFR calc Af Amer 8 (*)    All other components within normal limits  CBC WITH DIFFERENTIAL/PLATELET - Abnormal; Notable for the following:    WBC 11.9 (*)    RBC 3.76 (*)    Hemoglobin 11.7 (*)    Neutro Abs 9.2 (*)    All other components within normal limits  I-STAT TROPOININ, ED     EKG  EKG Interpretation  Date/Time:  Tuesday  December 03 2016 12:04:21 EST Ventricular Rate:  68 PR Interval:    QRS Duration: 104 QT Interval:  441 QTC Calculation: 469 R Axis:   17 Text Interpretation:  Sinus rhythm Atrial premature complex Low voltage, precordial leads Probable anteroseptal infarct, old No significant change since last tracing Confirmed by LITTLE MD, RACHEL [phone]) on 12/03/2016 1:02:18 PM         Radiology Dg Chest 2 View  Result Date: 12/03/2016 CLINICAL DATA:  Chest pressure. Chronic renal failure. Cardiac arrhythmia EXAM: CHEST  2 VIEW COMPARISON:  January 25, 2014 FINDINGS: There is no edema or consolidation. Heart is upper normal in size with pulmonary vascularity within normal limits. No adenopathy. There is atherosclerotic calcification in the aorta. There is degenerative change in the thoracic spine peer IMPRESSION: Aortic atherosclerosis.  No edema or consolidation. Electronically Signed   By: Lowella Grip III M.D.   On: 12/03/2016 13:42    Procedures Procedures (including critical care time) Procedures  Medications Ordered in ED  Medications  calcium acetate (PHOSLO) capsule 667 mg (not administered)  DSS CAPS 100 mg (not administered)  fish oil-omega-3 fatty acids capsule 1 g (not administered)  pantoprazole (PROTONIX) EC tablet 40 mg (not administered)  aspirin chewable tablet 324 mg (324 mg Oral Given 12/03/16  1449)  calcium carbonate (TUMS - dosed in mg elemental calcium) chewable tablet 400 mg of elemental calcium (400 mg of elemental calcium Oral Given 12/03/16 1439)  ondansetron (ZOFRAN) injection 4 mg (4 mg Intravenous Given 12/03/16 1439)     Initial Impression / Assessment and Plan / ED Course  I have reviewed the triage vital signs and the nursing notes.  Pertinent labs & imaging results that were available during my care of the patient were reviewed by me and considered in my medical decision making (see chart for details).  Clinical Course     PT w/ 15-70min episode of chest pressure w/ nausea and SOB that occurred just after dialysis today. She was awake and alert, no acute distress on exam. Normal vital signs. There was concern for possible atrial fibrillation by EMS but she is in sinus rhythm here. No pain on my examination. Gave Zofran, aspirin, and Tums for reflux. Chest x-ray negative for acute process. Obtained above lab work  The patient reports history of reflux but states that she has never had this chest pressure before. Her HEART score is 4-5 as she has multiple risk factors for heart disease and has not had any recent cardiac workup. Recommended admission for chest pain rule out. Discussed with [facility], Dr. Wynelle Cleveland, and pt admitted for further work up.   Final Clinical Impressions(s) / ED Diagnoses   Final diagnoses:  Chest pain, unspecified type  Nausea     New Prescriptions   No medications on file       Sharlett Iles, MD 12/03/16 1627

## 2016-12-03 NOTE — Progress Notes (Signed)
ANTICOAGULATION CONSULT NOTE - Initial Consult  Pharmacy Consult for Heparin Indication: chest pain/ACS  Allergies  Allergen Reactions  . Sulfa Drugs Cross Reactors Other (See Comments)    Doesn't remember     Patient Measurements: Height: 5\' 5"  (165.1 cm) Weight: 229 lb 6.4 oz (104.1 kg) IBW/kg (Calculated) : 57 Heparin Dosing Weight: 81 kg  Vital Signs: Temp: 98.7 F (37.1 C) (01/02 2026) Temp Source: Oral (01/02 2026) BP: 113/69 (01/02 2026) Pulse Rate: 64 (01/02 2026)  Labs:  Recent Labs  12/03/16 1445 12/03/16 1755  HGB 11.7* 11.5*  HCT 36.3 35.2*  PLT 197 203  CREATININE 5.67* 6.03*  TROPONINI  --  3.00*    Estimated Creatinine Clearance: 10.1 mL/min (by C-G formula based on SCr of 6.03 mg/dL (H)).   Medical History: Past Medical History:  Diagnosis Date  . Chronic kidney disease   . Diabetes mellitus   . Hyperlipidemia   . Hypertension   . Obesity     Medications:  Prescriptions Prior to Admission  Medication Sig Dispense Refill Last Dose  . b complex vitamins capsule Take 1 capsule by mouth daily.   12/02/2016 at Unknown time  . calcium acetate (PHOSLO) 667 MG capsule Take 667 mg by mouth See admin instructions. Take 667 mg by mouth 3 times daily with meals and take 667 mg by mouth with snacks.  10 12/02/2016 at Unknown time  . docusate sodium 100 MG CAPS Take 200 mg by mouth 2 (two) times daily. (Patient taking differently: Take 100 mg by mouth at bedtime. ) 10 capsule 0 Past Week at Unknown time  . fish oil-omega-3 fatty acids 1000 MG capsule Take 1 g by mouth daily.     12/02/2016 at Unknown time  . meclizine (ANTIVERT) 25 MG tablet Take 1/2 tablet q 6h prn vertigo (Patient taking differently: Take 25 mg by mouth every 6 (six) hours as needed for dizziness or nausea. ) 28 tablet 0 12/02/2016 at Unknown time  . omeprazole (PRILOSEC) 20 MG capsule Take 20 mg by mouth daily.  3 12/02/2016 at Unknown time  . albuterol (PROVENTIL HFA;VENTOLIN HFA) 108 (90 BASE)  MCG/ACT inhaler Inhale 2 puffs into the lungs every 6 (six) hours as needed for wheezing or shortness of breath.   Unknown at Unknown  . insulin aspart (NOVOLOG FLEXPEN) 100 UNIT/ML FlexPen Inject 3 Units into the skin 3 (three) times daily with meals. (Patient not taking: Reported on 12/03/2016) 15 mL 11 Not Taking at Unknown time  . Insulin Glargine (LANTUS SOLOSTAR) 100 UNIT/ML Solostar Pen Inject 28 Units into the skin daily at 10 pm. (Patient not taking: Reported on 12/03/2016) 15 mL 11 Not Taking at Unknown time  . Insulin Pen Needle (NOVOFINE) 30G X 8 MM MISC Inject 10 each into the skin as needed. (Patient not taking: Reported on 12/03/2016) 160 each 0 Not Taking at Unknown time  . labetalol (NORMODYNE) 200 MG tablet Take 1 tablet (200 mg total) by mouth 2 (two) times daily. (Patient not taking: Reported on 12/03/2016)   Not Taking at Unknown time    Assessment: 71 yo F presented to ED with CP following HD on 12/03/16.  Initial troponin in ED normal, now elevated.  To start heparin.  Pt received Lovenox 40mg  SQ at 2146.  PMH: ESRD (HD TTS), HTN, HLD, obesity  Goal of Therapy:  Heparin level 0.3-0.7 units/ml Monitor platelets by anticoagulation protocol: Yes   Plan:  Heparin infusion at 1150 units/hr - did not bolus due  to Lovenox dose Heparin level at 0600 on 1/3 Heparin level and CBC daily while on heparin Follow-up cardiology recommendations  [facility], Pharm.D., BCPS Clinical Pharmacist Pager [phone] 12/03/2016 10:14 PM

## 2016-12-04 ENCOUNTER — Encounter (HOSPITAL_COMMUNITY): Payer: Self-pay | Admitting: General Practice

## 2016-12-04 ENCOUNTER — Encounter (HOSPITAL_COMMUNITY)
Admission: EM | Disposition: A | Discharge status: Home / Self Care | Payer: Self-pay | Source: Home / Self Care | Attending: Internal Medicine

## 2016-12-04 DIAGNOSIS — Z880 Allergy status to penicillin: Secondary | ICD-10-CM | POA: Diagnosis not present

## 2016-12-04 DIAGNOSIS — Z8249 Family history of ischemic heart disease and other diseases of the circulatory system: Secondary | ICD-10-CM | POA: Diagnosis not present

## 2016-12-04 DIAGNOSIS — I252 Old myocardial infarction: Secondary | ICD-10-CM

## 2016-12-04 DIAGNOSIS — I120 Hypertensive chronic kidney disease with stage 5 chronic kidney disease or end stage renal disease: Secondary | ICD-10-CM | POA: Diagnosis present

## 2016-12-04 DIAGNOSIS — Z794 Long term (current) use of insulin: Secondary | ICD-10-CM | POA: Diagnosis not present

## 2016-12-04 DIAGNOSIS — G4733 Obstructive sleep apnea (adult) (pediatric): Secondary | ICD-10-CM | POA: Diagnosis present

## 2016-12-04 DIAGNOSIS — N2581 Secondary hyperparathyroidism of renal origin: Secondary | ICD-10-CM | POA: Diagnosis present

## 2016-12-04 DIAGNOSIS — Z992 Dependence on renal dialysis: Secondary | ICD-10-CM | POA: Diagnosis not present

## 2016-12-04 DIAGNOSIS — E1122 Type 2 diabetes mellitus with diabetic chronic kidney disease: Secondary | ICD-10-CM | POA: Diagnosis present

## 2016-12-04 DIAGNOSIS — I214 Non-ST elevation (NSTEMI) myocardial infarction: Secondary | ICD-10-CM | POA: Diagnosis present

## 2016-12-04 DIAGNOSIS — K219 Gastro-esophageal reflux disease without esophagitis: Secondary | ICD-10-CM | POA: Diagnosis present

## 2016-12-04 DIAGNOSIS — I2510 Atherosclerotic heart disease of native coronary artery without angina pectoris: Secondary | ICD-10-CM | POA: Diagnosis present

## 2016-12-04 DIAGNOSIS — E785 Hyperlipidemia, unspecified: Secondary | ICD-10-CM | POA: Diagnosis present

## 2016-12-04 DIAGNOSIS — Z833 Family history of diabetes mellitus: Secondary | ICD-10-CM | POA: Diagnosis not present

## 2016-12-04 DIAGNOSIS — R001 Bradycardia, unspecified: Secondary | ICD-10-CM | POA: Diagnosis present

## 2016-12-04 DIAGNOSIS — I200 Unstable angina: Secondary | ICD-10-CM | POA: Diagnosis not present

## 2016-12-04 DIAGNOSIS — I10 Essential (primary) hypertension: Secondary | ICD-10-CM | POA: Diagnosis not present

## 2016-12-04 DIAGNOSIS — R079 Chest pain, unspecified: Secondary | ICD-10-CM | POA: Diagnosis present

## 2016-12-04 DIAGNOSIS — N186 End stage renal disease: Secondary | ICD-10-CM | POA: Diagnosis present

## 2016-12-04 DIAGNOSIS — E669 Obesity, unspecified: Secondary | ICD-10-CM | POA: Diagnosis present

## 2016-12-04 DIAGNOSIS — Z6838 Body mass index (BMI) 38.0-38.9, adult: Secondary | ICD-10-CM | POA: Diagnosis not present

## 2016-12-04 HISTORY — PX: CORONARY ANGIOPLASTY WITH STENT PLACEMENT: SHX49

## 2016-12-04 HISTORY — PX: CARDIAC CATHETERIZATION: SHX172

## 2016-12-04 LAB — GLUCOSE, CAPILLARY
GLUCOSE-CAPILLARY: 111 mg/dL — AB (ref 65–99)
GLUCOSE-CAPILLARY: 110 mg/dL — AB (ref 65–99)
GLUCOSE-CAPILLARY: 112 mg/dL — AB (ref 65–99)
Glucose-Capillary: 110 mg/dL — ABNORMAL HIGH (ref 65–99)

## 2016-12-04 LAB — POCT ACTIVATED CLOTTING TIME
ACTIVATED CLOTTING TIME: 213 s
ACTIVATED CLOTTING TIME: 191 s
Activated Clotting Time: 389 seconds

## 2016-12-04 LAB — CBC
HCT: 36.1 % (ref 36.0–46.0)
Hemoglobin: 11.5 g/dL — ABNORMAL LOW (ref 12.0–15.0)
MCH: 30.5 pg (ref 26.0–34.0)
MCHC: 31.9 g/dL (ref 30.0–36.0)
MCV: 95.8 fL (ref 78.0–100.0)
PLATELETS: 216 10*3/uL (ref 150–400)
RBC: 3.77 MIL/uL — ABNORMAL LOW (ref 3.87–5.11)
RDW: 13.4 % (ref 11.5–15.5)
WBC: 11.1 10*3/uL — ABNORMAL HIGH (ref 4.0–10.5)

## 2016-12-04 LAB — PROTIME-INR
INR: 1.13
Prothrombin Time: 14.6 seconds (ref 11.4–15.2)

## 2016-12-04 LAB — HEMOGLOBIN A1C
Hgb A1c MFr Bld: 5.4 % (ref 4.8–5.6)
MEAN PLASMA GLUCOSE: 108 mg/dL

## 2016-12-04 LAB — BASIC METABOLIC PANEL
Anion gap: 13 (ref 5–15)
BUN: 31 mg/dL — AB (ref 6–20)
CO2: 31 mmol/L (ref 22–32)
CREATININE: 7.39 mg/dL — AB (ref 0.44–1.00)
Calcium: 9.2 mg/dL (ref 8.9–10.3)
Chloride: 97 mmol/L — ABNORMAL LOW (ref 101–111)
GFR, EST AFRICAN AMERICAN: 6 mL/min — AB (ref >60)
GFR, EST NON AFRICAN AMERICAN: 5 mL/min — AB (ref >60)
Glucose, Bld: 127 mg/dL — ABNORMAL HIGH (ref 65–99)
Potassium: 4.1 mmol/L (ref 3.5–5.1)
SODIUM: 141 mmol/L (ref 135–145)

## 2016-12-04 LAB — MRSA PCR SCREENING: MRSA by PCR: NEGATIVE

## 2016-12-04 LAB — LIPID PANEL
Cholesterol: 171 mg/dL (ref 0–200)
HDL: 54 mg/dL (ref >40)
LDL Cholesterol: 108 mg/dL — ABNORMAL HIGH (ref 0–99)
Total CHOL/HDL Ratio: 3.2 RATIO
Triglycerides: 44 mg/dL (ref <150)
VLDL: 9 mg/dL (ref 0–40)

## 2016-12-04 LAB — HEPARIN LEVEL (UNFRACTIONATED)
HEPARIN UNFRACTIONATED: 0.79 [IU]/mL — AB (ref 0.30–0.70)
HEPARIN UNFRACTIONATED: 0.79 [IU]/mL — AB (ref 0.30–0.70)

## 2016-12-04 LAB — TROPONIN I: TROPONIN I: 2.87 ng/mL — AB (ref <0.03)

## 2016-12-04 SURGERY — LEFT HEART CATH AND CORONARY ANGIOGRAPHY

## 2016-12-04 MED ORDER — MIDAZOLAM HCL 2 MG/2ML IJ SOLN
INTRAMUSCULAR | Status: AC
  Filled 2016-12-04: qty 2

## 2016-12-04 MED ORDER — LIDOCAINE HCL (PF) 1 % IJ SOLN
INTRAMUSCULAR | Status: AC
  Filled 2016-12-04: qty 30

## 2016-12-04 MED ORDER — MIDAZOLAM HCL 2 MG/2ML IJ SOLN
INTRAMUSCULAR | Status: DC | PRN
  Administered 2016-12-04 (×2): 1 mg via INTRAVENOUS

## 2016-12-04 MED ORDER — LORATADINE 10 MG PO TABS
10.0000 mg | ORAL_TABLET | Freq: Every day | ORAL | Status: DC
  Administered 2016-12-04 – 2016-12-05 (×2): 10 mg via ORAL
  Filled 2016-12-04 (×2): qty 1

## 2016-12-04 MED ORDER — SODIUM CHLORIDE 0.9% FLUSH: 3.0000 mL | INTRAVENOUS | Status: DC | PRN

## 2016-12-04 MED ORDER — FENTANYL CITRATE (PF) 100 MCG/2ML IJ SOLN
INTRAMUSCULAR | Status: DC | PRN
  Administered 2016-12-04 (×2): 25 ug via INTRAVENOUS

## 2016-12-04 MED ORDER — TICAGRELOR 90 MG PO TABS
90.0000 mg | ORAL_TABLET | Freq: Two times a day (BID) | ORAL | Status: DC
  Administered 2016-12-05: 07:00:00 90 mg via ORAL
  Filled 2016-12-04: qty 1

## 2016-12-04 MED ORDER — IOPAMIDOL (ISOVUE-370) INJECTION 76%
INTRAVENOUS | Status: AC
  Filled 2016-12-04: qty 100

## 2016-12-04 MED ORDER — SODIUM CHLORIDE 0.9 % IV SOLN: 250.0000 mL | INTRAVENOUS | Status: DC | PRN

## 2016-12-04 MED ORDER — TICAGRELOR 90 MG PO TABS
ORAL_TABLET | ORAL | Status: DC | PRN
  Administered 2016-12-04: 180 mg via ORAL

## 2016-12-04 MED ORDER — SODIUM CHLORIDE 0.9% FLUSH
3.0000 mL | Freq: Two times a day (BID) | INTRAVENOUS | Status: DC
  Administered 2016-12-04: 22:00:00 3 mL via INTRAVENOUS

## 2016-12-04 MED ORDER — BIVALIRUDIN 250 MG IV SOLR
INTRAVENOUS | Status: AC
  Filled 2016-12-04: qty 250

## 2016-12-04 MED ORDER — ATORVASTATIN CALCIUM 80 MG PO TABS: 80.0000 mg | ORAL_TABLET | Freq: Every day | ORAL | Status: DC

## 2016-12-04 MED ORDER — ASPIRIN EC 81 MG PO TBEC
81.0000 mg | DELAYED_RELEASE_TABLET | Freq: Every day | ORAL | Status: DC
  Administered 2016-12-05: 81 mg via ORAL
  Filled 2016-12-04: qty 1

## 2016-12-04 MED ORDER — ASPIRIN 81 MG PO CHEW
81.0000 mg | CHEWABLE_TABLET | ORAL | Status: AC
  Administered 2016-12-04: 81 mg via ORAL
  Filled 2016-12-04: qty 1

## 2016-12-04 MED ORDER — LIDOCAINE HCL (PF) 1 % IJ SOLN
INTRAMUSCULAR | Status: DC | PRN
  Administered 2016-12-04: 20 mL

## 2016-12-04 MED ORDER — BIVALIRUDIN BOLUS VIA INFUSION - CUPID
INTRAVENOUS | Status: DC | PRN
  Administered 2016-12-04: 78.825 mg via INTRAVENOUS

## 2016-12-04 MED ORDER — SODIUM CHLORIDE 0.9% FLUSH
3.0000 mL | Freq: Two times a day (BID) | INTRAVENOUS | Status: DC
Start: 2016-12-04 — End: 2016-12-04
  Administered 2016-12-04: 3 mL via INTRAVENOUS

## 2016-12-04 MED ORDER — LABETALOL HCL 5 MG/ML IV SOLN: 10.0000 mg | INTRAVENOUS | Status: AC | PRN

## 2016-12-04 MED ORDER — FENTANYL CITRATE (PF) 100 MCG/2ML IJ SOLN
INTRAMUSCULAR | Status: AC
  Filled 2016-12-04: qty 2

## 2016-12-04 MED ORDER — IOPAMIDOL (ISOVUE-370) INJECTION 76%
INTRAVENOUS | Status: DC | PRN
Start: 2016-12-04 — End: 2016-12-04
  Administered 2016-12-04: 130 mL via INTRA_ARTERIAL

## 2016-12-04 MED ORDER — SODIUM CHLORIDE 0.9 % IV SOLN
INTRAVENOUS | Status: DC | PRN
  Administered 2016-12-04: 0.25 mg/kg/h via INTRAVENOUS

## 2016-12-04 MED ORDER — SODIUM CHLORIDE 0.9 % IV SOLN
INTRAVENOUS | Status: DC
  Administered 2016-12-04: 05:00:00 via INTRAVENOUS

## 2016-12-04 MED ORDER — HEPARIN (PORCINE) IN NACL 2-0.9 UNIT/ML-% IJ SOLN
INTRAMUSCULAR | Status: DC | PRN
  Administered 2016-12-04: 1500 mL

## 2016-12-04 MED ORDER — TICAGRELOR 90 MG PO TABS
ORAL_TABLET | ORAL | Status: AC
  Filled 2016-12-04: qty 2

## 2016-12-04 MED ORDER — HEPARIN (PORCINE) IN NACL 2-0.9 UNIT/ML-% IJ SOLN
INTRAMUSCULAR | Status: AC
Start: 2016-12-04 — End: 2016-12-04
  Filled 2016-12-04: qty 1500

## 2016-12-04 MED ORDER — HYDRALAZINE HCL 20 MG/ML IJ SOLN
5.0000 mg | INTRAMUSCULAR | Status: AC | PRN
  Administered 2016-12-04: 5 mg via INTRAVENOUS
  Filled 2016-12-04: qty 1

## 2016-12-04 SURGICAL SUPPLY — 17 items
BALLN MOZEC 2.50X14 (BALLOONS) ×2
BALLN ~~LOC~~ EUPHORA RX 3.25X15 (BALLOONS) ×2
BALLOON MOZEC 2.50X14 (BALLOONS) IMPLANT
BALLOON ~~LOC~~ EUPHORA RX 3.25X15 (BALLOONS) IMPLANT
CATH INFINITI 5FR MULTPACK ANG (CATHETERS) ×1 IMPLANT
GUIDE CATH RUNWAY 6FR FR4 (CATHETERS) ×1 IMPLANT
KIT ENCORE 26 ADVANTAGE (KITS) ×1 IMPLANT
KIT HEART LEFT (KITS) ×2 IMPLANT
PACK CARDIAC CATHETERIZATION (CUSTOM PROCEDURE TRAY) ×2 IMPLANT
SHEATH PINNACLE 5F 10CM (SHEATH) ×1 IMPLANT
SHEATH PINNACLE 6F 10CM (SHEATH) ×1 IMPLANT
STENT RESOLUTE ONYX 3.0X18 (Permanent Stent) ×1 IMPLANT
SYR MEDRAD MARK V 150ML (SYRINGE) ×2 IMPLANT
TRANSDUCER W/STOPCOCK (MISCELLANEOUS) ×2 IMPLANT
TUBING CIL FLEX 10 FLL-RA (TUBING) ×2 IMPLANT
WIRE COUGAR XT STRL 190CM (WIRE) ×1 IMPLANT
WIRE EMERALD 3MM-J .035X150CM (WIRE) ×1 IMPLANT

## 2016-12-04 NOTE — Progress Notes (Signed)
PROGRESS NOTE    Jacqueline [name]  QIO:[id number] DOB: 29-May-1946 DOA: 12/03/2016 PCP: Maximino Greenland, MD   Outpatient Specialists:     Brief Narrative:  Jacqueline [name] is a 71 y.o. female with medical history significant of ESRD,   HTN obesity who presents with chest pain. She states that she had completed her dialysis session today and was waiting to be picked up by her husband when she noted chest discomfort that she describes as tightness. She states it was associated with nausea and shortness of breath. She went back into the dialysis center and was placed on oxygen and EMS was called. She states the discomfort lasted for at least 15 minutes and resolved on its own. She has not had pressure like this before. No complaints of feeling palpitations during the episode. Discomfort did not radiate to jaw or arm. Does admit to having occasional reflux where food sometimes cuts into her mouth. She states it occurs more frequently when she forgets to take her medication. Episode today did not feel like her reflux. No complaints of epigastric pain.   Assessment & Plan:   Principal Problem:   Chest pain Active Problems:   ESRD (end stage renal disease) on dialysis (H[facility])   Hypertension   OSA (obstructive sleep apnea)   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HCC)   NSTEMI (non-ST elevated myocardial infarction) (HCC)   Chest Pressure -troponin elevated -IV heparin -for cath 1/3  Gastroesophageal reflux disease -Continue PPI daily    ESRD (end stage renal disease) on dialysis -Underwent dialysis today-if she is here on Thursday, will need to consult nephrology for dialysis    OSA (obstructive sleep apnea) -C Pap    Secondary renal hyperparathyroidism -Continue home medications    DVT prophylaxis:  Fully anticoagulated   Code Status: Full Code   Family Communication:   Disposition Plan:     Consultants:   cardiology   Subjective: No current chest  pain  Objective: Vitals:   12/03/16 1829 12/03/16 2026 12/04/16 0455 12/04/16 1341  BP: (!) 144/82 113/69 (!) 134/51 (!) 145/56  Pulse: 65 64 67 63  Resp:  18 18 15   Temp: 99.4 F (37.4 C) 98.7 F (37.1 C) 98.8 F (37.1 C) 98.6 F (37 C)  TempSrc: Oral Oral Oral Oral  SpO2: 100% 94% 96% 91%  Weight: 104.1 kg (229 lb 6.4 oz)  105.1 kg (231 lb 9.6 oz)   Height: 5\' 5"  (1.651 m)       Intake/Output Summary (Last 24 hours) at 12/04/16 1423 Last data filed at 12/04/16 1223  Gross per 24 hour  Intake                0 ml  Output                0 ml  Net                0 ml   Filed Weights   12/03/16 1211 12/03/16 1829 12/04/16 0455  Weight: 104 kg (229 lb 4.5 oz) 104.1 kg (229 lb 6.4 oz) 105.1 kg (231 lb 9.6 oz)    Examination:  General exam: NAD Respiratory system: diminished. Respiratory effort normal. Cardiovascular system: S1 & S2 heard, RRR. No JVD, murmurs, rubs, gallops or clicks. No pedal edema. Gastrointestinal system: Abdomen is nondistended, soft and nontender. No organomegaly or masses felt. Normal bowel sounds heard. Central nervous system: Alert and oriented. No focal neurological deficits.     Data Reviewed: I  have personally reviewed following labs and imaging studies  CBC:  Recent Labs Lab 12/03/16 1445 12/03/16 1755 12/04/16 0528  WBC 11.9* 10.9* 11.1*  NEUTROABS 9.2*  --   --   HGB 11.7* 11.5* 11.5*  HCT 36.3 35.2* 36.1  MCV 96.5 94.6 95.8  PLT 197 203 035   Basic Metabolic Panel:  Recent Labs Lab 12/03/16 1445 12/03/16 1755 12/04/16 0528  NA 141  --  141  K 3.4*  --  4.1  CL 98*  --  97*  CO2 32  --  31  GLUCOSE 93  --  127*  BUN 19  --  31*  CREATININE 5.67* 6.03* 7.39*  CALCIUM 8.8*  --  9.2   GFR: Estimated Creatinine Clearance: 8.3 mL/min (by C-G formula based on SCr of 7.39 mg/dL (H)). Liver Function Tests: No results for input(s): AST, ALT, ALKPHOS, BILITOT, PROT, ALBUMIN in the last 168 hours. No results for input(s):  LIPASE, AMYLASE in the last 168 hours. No results for input(s): AMMONIA in the last 168 hours. Coagulation Profile:  Recent Labs Lab 12/04/16 0528  INR 1.13   Cardiac Enzymes:  Recent Labs Lab 12/03/16 1755 12/03/16 2236 12/04/16 0528  TROPONINI 3.00* 2.69* 2.87*   BNP (last 3 results) No results for input(s): PROBNP in the last 8760 hours. HbA1C:  Recent Labs  12/03/16 1445  HGBA1C 5.4   CBG:  Recent Labs Lab 12/03/16 2124 12/04/16 0757 12/04/16 1157  GLUCAP 208* 111* 110*   Lipid Profile:  Recent Labs  12/04/16 0528  CHOL 171  HDL 54  LDLCALC 108*  TRIG 44  CHOLHDL 3.2   Thyroid Function Tests: No results for input(s): TSH, T4TOTAL, FREET4, T3FREE, THYROIDAB in the last 72 hours. Anemia Panel: No results for input(s): VITAMINB12, FOLATE, FERRITIN, TIBC, IRON, RETICCTPCT in the last 72 hours. Urine analysis:    Component Value Date/Time   COLORURINE YELLOW 01/25/2014 1903   APPEARANCEUR TURBID (A) 01/25/2014 1903   LABSPEC 1.020 01/25/2014 1903   PHURINE 5.0 01/25/2014 1903   GLUCOSEU NEGATIVE 01/25/2014 1903   HGBUR LARGE (A) 01/25/2014 1903   BILIRUBINUR SMALL (A) 01/25/2014 1903   KETONESUR NEGATIVE 01/25/2014 1903   PROTEINUR 100 (A) 01/25/2014 1903   UROBILINOGEN 1.0 01/25/2014 1903   NITRITE NEGATIVE 01/25/2014 1903   LEUKOCYTESUR LARGE (A) 01/25/2014 1903     Recent Results (from the past 240 hour(s))  MRSA PCR Screening     Status: None   Collection Time: 12/04/16  4:22 AM  Result Value Ref Range Status   MRSA by PCR NEGATIVE NEGATIVE Final    Comment:        The GeneXpert MRSA Assay (FDA approved for NASAL specimens only), is one component of a comprehensive MRSA colonization surveillance program. It is not intended to diagnose MRSA infection nor to guide or monitor treatment for MRSA infections.       Anti-infectives    None       Radiology Studies: Dg Chest 2 View  Result Date: 12/03/2016 CLINICAL DATA:  Chest  pressure. Chronic renal failure. Cardiac arrhythmia EXAM: CHEST  2 VIEW COMPARISON:  January 25, 2014 FINDINGS: There is no edema or consolidation. Heart is upper normal in size with pulmonary vascularity within normal limits. No adenopathy. There is atherosclerotic calcification in the aorta. There is degenerative change in the thoracic spine peer IMPRESSION: Aortic atherosclerosis.  No edema or consolidation. Electronically Signed   By: Lowella Grip III M.D.   On: 12/03/2016 13:42  Scheduled Meds: . aspirin  324 mg Oral NOW   Or  . aspirin  300 mg Rectal NOW  . [START ON 12/05/2016] aspirin EC  81 mg Oral Daily  . calcium acetate  667 mg Oral TID WC  . docusate sodium  100 mg Oral QHS  . loratadine  10 mg Oral Daily  . omega-3 acid ethyl esters  1 g Oral Daily  . pantoprazole  40 mg Oral Daily  . sodium chloride flush  3 mL Intravenous Q12H   Continuous Infusions: . sodium chloride 10 mL/hr at 12/04/16 0520  . heparin 1,050 Units/hr (12/04/16 0611)     LOS: 0 days    Time spent: 25 min    [place], DO [facility] Pager [phone]  If 7PM-7AM, please contact night-coverage [email or web address] Password [email or web address] 12/04/2016, 2:23 PM

## 2016-12-04 NOTE — Progress Notes (Signed)
ANTICOAGULATION CONSULT NOTE - Follow-up Consult  Pharmacy Consult for Heparin Indication: chest pain/ACS  Allergies  Allergen Reactions  . Sulfa Drugs Cross Reactors Other (See Comments)    Doesn't remember     Patient Measurements: Height: 5\' 5"  (165.1 cm) Weight: 231 lb 9.6 oz (105.1 kg) IBW/kg (Calculated) : 57 Heparin Dosing Weight: 81 kg  Vital Signs: Temp: 98.8 F (37.1 C) (01/03 0455) Temp Source: Oral (01/03 0455) BP: 134/51 (01/03 0455) Pulse Rate: 67 (01/03 0455)  Labs:  Recent Labs  12/03/16 1445 12/03/16 1755 12/03/16 2236 12/04/16 0528  HGB 11.7* 11.5*  --  11.5*  HCT 36.3 35.2*  --  36.1  PLT 197 203  --  216  LABPROT  --   --   --  14.6  INR  --   --   --  1.13  HEPARINUNFRC  --   --   --  0.79*  CREATININE 5.67* 6.03*  --   --   TROPONINI  --  3.00* 2.69*  --     Estimated Creatinine Clearance: 10.1 mL/min (by C-G formula based on SCr of 6.03 mg/dL (H)).  Assessment: 71 yo F on heparin for NSTEMI. Heparin level supratherapeutic (0.79) on gtt at 1150 units/hr. Noted Lovenox 40mg  also given 1/2 2146. No bleeding noted. CBC stable.  Goal of Therapy:  Heparin level 0.3-0.7 units/ml Monitor platelets by anticoagulation protocol: Yes   Plan:  Decrease heparin infusion to 1050 units/hr  Will f/u 8 hr heparin level  Sherlon Handing, PharmD, BCPS Clinical pharmacist, pager [phone] 12/04/2016 6:08 AM

## 2016-12-04 NOTE — Interval H&P Note (Signed)
History and Physical Interval Note:  12/04/2016 2:37 PM  Jacqueline [name]  has presented today for cardiac catheterization, with the diagnosis of nstemi. The various methods of treatment have been discussed with the patient and family. After consideration of risks, benefits and other options for treatment, the patient has consented to  Procedure(s): Left Heart Cath Angiography (N/A) as a surgical intervention .  The patient's history has been reviewed, patient examined, no change in status, stable for surgery.  I have reviewed the patient's chart and labs.  Questions were answered to the patient's satisfaction.    Cath Lab Visit (complete for each Cath Lab visit)  Clinical Evaluation Leading to the Procedure:   ACS: Yes.    Non-ACS:  N/A  Imanol Bihl

## 2016-12-04 NOTE — H&P (View-Only) (Signed)
Admit date: 12/03/2016 Referring Physician  Dr. Wynelle Cleveland Primary Physician  Dr. Glendale Chard, MD Primary Cardiologist  none Reason for Consultation  Chest pain  HPI: This is a 71yo female with a history of ESRD on HD, hyperlipidemia, HTN and obesity who presented to the ER with complaints of CP.  She was at HD and waiting for her husband to pick her up and developed chest discomfort described as a pressure sensation across her chest associated with SOB and nausea.  She went back inside the HD center and was placed on O2.  EMS was called and by the time she arrived in the ER her pain was gone.  The pain lasted about 15 minutes.  She has no prior cardiac history but does have a history of GERD.  Her initial trop PCO at 3pm was normal at 0.03 in the ER but subsequent troponin increased to 3.  She has not had any further CP since admission.  Cardiology is now asked to consult.  She denies any prior heart problems or CP prior to todays event.      PMH:   Past Medical History:  Diagnosis Date  . Chronic kidney disease   . Diabetes mellitus   . Hyperlipidemia   . Hypertension   . Obesity      PSH:   Past Surgical History:  Procedure Laterality Date  . ABDOMINAL HYSTERECTOMY    . AV FISTULA PLACEMENT Left 01/27/2014   Procedure: ARTERIOVENOUS (AV) FISTULA CREATION;  Surgeon: Mal Misty, MD;  Location: [facility];  Service: Vascular;  Laterality: Left;  . INSERTION OF DIALYSIS CATHETER N/A 01/19/2014   Procedure: INSERTION OF DIALYSIS CATHETER;  Surgeon: Rosetta Posner, MD;  Location: [facility];  Service: Vascular;  Laterality: N/A;    Allergies:  Sulfa drugs cross reactors Prior to Admit Meds:   Prescriptions Prior to Admission  Medication Sig Dispense Refill Last Dose  . b complex vitamins capsule Take 1 capsule by mouth daily.   12/02/2016 at Unknown time  . calcium acetate (PHOSLO) 667 MG capsule Take 667 mg by mouth See admin instructions. Take 667 mg by mouth 3 times daily with meals and take  667 mg by mouth with snacks.  10 12/02/2016 at Unknown time  . docusate sodium 100 MG CAPS Take 200 mg by mouth 2 (two) times daily. (Patient taking differently: Take 100 mg by mouth at bedtime. ) 10 capsule 0 Past Week at Unknown time  . fish oil-omega-3 fatty acids 1000 MG capsule Take 1 g by mouth daily.     12/02/2016 at Unknown time  . meclizine (ANTIVERT) 25 MG tablet Take 1/2 tablet q 6h prn vertigo (Patient taking differently: Take 25 mg by mouth every 6 (six) hours as needed for dizziness or nausea. ) 28 tablet 0 12/02/2016 at Unknown time  . omeprazole (PRILOSEC) 20 MG capsule Take 20 mg by mouth daily.  3 12/02/2016 at Unknown time  . albuterol (PROVENTIL HFA;VENTOLIN HFA) 108 (90 BASE) MCG/ACT inhaler Inhale 2 puffs into the lungs every 6 (six) hours as needed for wheezing or shortness of breath.   Unknown at Unknown  . insulin aspart (NOVOLOG FLEXPEN) 100 UNIT/ML FlexPen Inject 3 Units into the skin 3 (three) times daily with meals. (Patient not taking: Reported on 12/03/2016) 15 mL 11 Not Taking at Unknown time  . Insulin Glargine (LANTUS SOLOSTAR) 100 UNIT/ML Solostar Pen Inject 28 Units into the skin daily at 10 pm. (Patient not taking: Reported on  12/03/2016) 15 mL 11 Not Taking at Unknown time  . Insulin Pen Needle (NOVOFINE) 30G X 8 MM MISC Inject 10 each into the skin as needed. (Patient not taking: Reported on 12/03/2016) 160 each 0 Not Taking at Unknown time  . labetalol (NORMODYNE) 200 MG tablet Take 1 tablet (200 mg total) by mouth 2 (two) times daily. (Patient not taking: Reported on 12/03/2016)   Not Taking at Unknown time   Fam HX:    Family History  Problem Relation Age of Onset  . Diabetes Mother   . Peripheral vascular disease Mother     amputation  . CAD Father   . Heart disease Father   . Hypertension Father   . Heart attack Father   . CAD Sister    Social HX:    Social History   Social History  . Marital status: Married    Spouse name: N/A  . Number of children: N/A  .  Years of education: N/A   Occupational History  . Not on file.   Social History Main Topics  . Smoking status: Never Smoker  . Smokeless tobacco: Never Used  . Alcohol use No  . Drug use: No  . Sexual activity: No   Other Topics Concern  . Not on file   Social History Narrative  . No narrative on file     ROS:  All 11 ROS were addressed and are negative except what is stated in the HPI  Physical Exam: Blood pressure 113/69, pulse 64, temperature 98.7 F (37.1 C), temperature source Oral, resp. rate 18, height 5\' 5"  (1.651 m), weight 229 lb 6.4 oz (104.1 kg), SpO2 94 %.    General: Well developed, well nourished, in no acute distress Head: Eyes PERRLA, No xanthomas.   Normal cephalic and atramatic  Lungs:   Clear bilaterally to auscultation and percussion. Heart:   HRRR S1 S2 Pulses are 2+ & equal.            No carotid bruit. No JVD.  No abdominal bruits. No femoral bruits. Abdomen: Bowel sounds are positive, abdomen soft and non-tender without masses  Msk:  Back normal, normal gait. Normal strength and tone for age. Extremities:   No clubbing, cyanosis or edema.  DP +1 Neuro: Alert and oriented X 3. Psych:  Good affect, responds appropriately    Labs:   Lab Results  Component Value Date   WBC 10.9 (H) 12/03/2016   HGB 11.5 (L) 12/03/2016   HCT 35.2 (L) 12/03/2016   MCV 94.6 12/03/2016   PLT 203 12/03/2016    Recent Labs Lab 12/03/16 1445 12/03/16 1755  NA 141  --   K 3.4*  --   CL 98*  --   CO2 32  --   BUN 19  --   CREATININE 5.67* 6.03*  CALCIUM 8.8*  --   GLUCOSE 93  --    No results found for: PTT Lab Results  Component Value Date   INR 1.07 01/27/2014   Lab Results  Component Value Date   TROPONINI 3.00 (Joice) 12/03/2016    No results found for: CHOL No results found for: HDL No results found for: LDLCALC No results found for: TRIG No results found for: CHOLHDL No results found for: LDLDIRECT    Radiology:  Dg Chest 2 View  Result  Date: 12/03/2016 CLINICAL DATA:  Chest pressure. Chronic renal failure. Cardiac arrhythmia EXAM: CHEST  2 VIEW COMPARISON:  January 25, 2014 FINDINGS: There is no  edema or consolidation. Heart is upper normal in size with pulmonary vascularity within normal limits. No adenopathy. There is atherosclerotic calcification in the aorta. There is degenerative change in the thoracic spine peer IMPRESSION: Aortic atherosclerosis.  No edema or consolidation. Electronically Signed   By: Lowella Grip III M.D.   On: 12/03/2016 13:42    EKG:  NSR with septal infarct and low voltage.  No acute ST changes  ASSESSMENT/PLAN:   1.  NSTEMI with elevated trop at 3 after only 15 minutes of CP right after HD.  EKG is nonischemic.  She has not had any further CP.  Agree with IV heparin gtt.  Continue ASA.  No BB due to bradycardia.  Start high dose statin.  Will make NPO for cath to define coronary anatomy. Cardiac catheterization was discussed with the patient fully. The patient understands that risks include but are not limited to stroke (1 in 1000), death (1 in 25), kidney failure [usually temporary] (1 in 500), bleeding (1 in 200), allergic reaction [possibly serious] (1 in 200).  The patient understands and is willing to proceed.    2.  HTN - BP controlled  3.  Hyperlipidemia - currently on no meds.  Will check an FLP in am and start high dose statin.   4.  GERD - continue PPI  5.  ESRD on HD - per nephrology  Fransico Him, MD  12/03/2016  10:37 PM

## 2016-12-05 ENCOUNTER — Encounter (HOSPITAL_COMMUNITY): Payer: Self-pay | Admitting: Cardiovascular Disease

## 2016-12-05 ENCOUNTER — Telehealth: Payer: Self-pay | Admitting: Cardiology

## 2016-12-05 DIAGNOSIS — I200 Unstable angina: Secondary | ICD-10-CM

## 2016-12-05 DIAGNOSIS — G4733 Obstructive sleep apnea (adult) (pediatric): Secondary | ICD-10-CM

## 2016-12-05 DIAGNOSIS — I10 Essential (primary) hypertension: Secondary | ICD-10-CM

## 2016-12-05 DIAGNOSIS — I214 Non-ST elevation (NSTEMI) myocardial infarction: Principal | ICD-10-CM

## 2016-12-05 DIAGNOSIS — Z2839 Other underimmunization status: Secondary | ICD-10-CM | POA: Insufficient documentation

## 2016-12-05 LAB — CBC
HEMATOCRIT: 34.3 % — AB (ref 36.0–46.0)
HEMOGLOBIN: 11 g/dL — AB (ref 12.0–15.0)
MCH: 30.6 pg (ref 26.0–34.0)
MCHC: 32.1 g/dL (ref 30.0–36.0)
MCV: 95.5 fL (ref 78.0–100.0)
Platelets: 216 10*3/uL (ref 150–400)
RBC: 3.59 MIL/uL — AB (ref 3.87–5.11)
RDW: 13.6 % (ref 11.5–15.5)
WBC: 10.8 10*3/uL — ABNORMAL HIGH (ref 4.0–10.5)

## 2016-12-05 LAB — BASIC METABOLIC PANEL
Anion gap: 15 (ref 5–15)
BUN: 41 mg/dL — ABNORMAL HIGH (ref 6–20)
CALCIUM: 9.1 mg/dL (ref 8.9–10.3)
CO2: 27 mmol/L (ref 22–32)
CREATININE: 9.96 mg/dL — AB (ref 0.44–1.00)
Chloride: 95 mmol/L — ABNORMAL LOW (ref 101–111)
GFR calc Af Amer: 4 mL/min — ABNORMAL LOW (ref >60)
GFR, EST NON AFRICAN AMERICAN: 3 mL/min — AB (ref >60)
GLUCOSE: 158 mg/dL — AB (ref 65–99)
Potassium: 4.6 mmol/L (ref 3.5–5.1)
Sodium: 137 mmol/L (ref 135–145)

## 2016-12-05 LAB — GLUCOSE, CAPILLARY: GLUCOSE-CAPILLARY: 118 mg/dL — AB (ref 65–99)

## 2016-12-05 LAB — POCT ACTIVATED CLOTTING TIME: Activated Clotting Time: 175 seconds

## 2016-12-05 MED ORDER — ASPIRIN 81 MG PO TBEC: 81.0000 mg | DELAYED_RELEASE_TABLET | Freq: Every day | ORAL | Status: DC

## 2016-12-05 MED ORDER — ATORVASTATIN CALCIUM 80 MG PO TABS: 80.0000 mg | ORAL_TABLET | Freq: Every day | ORAL | 0 refills | Status: DC

## 2016-12-05 MED ORDER — HEART ATTACK BOUNCING BOOK
Freq: Once | Status: AC
  Administered 2016-12-05: 05:00:00
  Filled 2016-12-05: qty 1

## 2016-12-05 MED ORDER — TICAGRELOR 90 MG PO TABS: 90.0000 mg | ORAL_TABLET | Freq: Two times a day (BID) | ORAL | 0 refills | Status: DC

## 2016-12-05 NOTE — Discharge Summary (Signed)
Physician Discharge Summary  Jacqueline [name] KCL:[id number] DOB: 1946-06-24 DOA: 12/03/2016  PCP: Maximino Greenland, MD  Admit date: 12/03/2016 Discharge date: 12/05/2016   Recommendations for Outpatient Follow-Up:   To HD today- I made arrangements with her center to get in later today Will need FLp and ALT in 6 weeks   Discharge Diagnosis:   Principal Problem:   Chest pain Active Problems:   ESRD (end stage renal disease) on dialysis ([place])   Hypertension   OSA (obstructive sleep apnea)   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[place])   NSTEMI (non-ST elevated myocardial infarction) [facility])   Discharge disposition:  Home  Discharge Condition: Improved.  Diet recommendation: renal/carb mod  Wound care: None.   History of Present Illness:   Jacqueline [name] is a 71 y.o. female with medical history significant of ESRD,   HTN obesity who presents with chest pain. She states that she had completed her dialysis session today and was waiting to be picked up by her husband when she noted chest discomfort that she describes as tightness. She states it was associated with nausea and shortness of breath. She went back into the dialysis center and was placed on oxygen and EMS was called. She states the discomfort lasted for at least 15 minutes and resolved on its own. She has not had pressure like this before. No complaints of feeling palpitations during the episode. Discomfort did not radiate to jaw or arm. Does admit to having occasional reflux where food sometimes cuts into her mouth. She states it occurs more frequently when she forgets to take her medication. Episode today did not feel like her reflux. No complaints of epigastric pain.   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  Continue DAPT with ASA and Brilinta.  Continue high dose statin. Restart home dose of labetolol.    HTN - BP  controlled on BB  Hyperlipidemia with LDL goal < 70.  Continue high dose statin.  Will need FLp and ALT in 6 weeks.    ESRD- for HD today-- arrangements made     Medical Consultants:    Nephrology  cardiology   Discharge Exam:   Vitals:   12/05/16 0600 12/05/16 0809  BP: (!) 140/46 (!) 130/58  Pulse: 66 73  Resp: 20 (!) 23  Temp: 98.6 F (37 C) 98.6 F (37 C)   Vitals:   12/05/16 0300 12/05/16 0400 12/05/16 0600 12/05/16 0809  BP: (!) 135/42 (!) 132/46 (!) 140/46 (!) 130/58  Pulse: 74 69 66 73  Resp: 19 18 20  (!) 23  Temp:   98.6 F (37 C) 98.6 F (37 C)  TempSrc:   Oral Oral  SpO2: 100% 91% 96% 95%  Weight:   105.1 kg (231 lb 11.3 oz)   Height:        Gen:  NAD- chest pain resolved  The results of significant diagnostics from this hospitalization (including imaging, microbiology, ancillary and laboratory) are listed below for reference.     Procedures and Diagnostic Studies:   Dg Chest 2 View  Result Date: 12/03/2016 CLINICAL DATA:  Chest pressure. Chronic renal failure. Cardiac arrhythmia EXAM: CHEST  2 VIEW COMPARISON:  January 25, 2014 FINDINGS: There is no edema or consolidation. Heart is upper normal in size with pulmonary vascularity within normal limits. No adenopathy. There is atherosclerotic calcification in the aorta. There is degenerative change in  the thoracic spine peer IMPRESSION: Aortic atherosclerosis.  No edema or consolidation. Electronically Signed   By: Lowella Grip III M.D.   On: 12/03/2016 13:42     Labs:   Basic Metabolic Panel:  Recent Labs Lab 12/03/16 1445 12/03/16 1755 12/04/16 0528 12/05/16 0402  NA 141  --  141 137  K 3.4*  --  4.1 4.6  CL 98*  --  97* 95*  CO2 32  --  31 27  GLUCOSE 93  --  127* 158*  BUN 19  --  31* 41*  CREATININE 5.67* 6.03* 7.39* 9.96*  CALCIUM 8.8*  --  9.2 9.1   GFR Estimated Creatinine Clearance: 6.1 mL/min (by C-G formula based on SCr of 9.96 mg/dL (H)). Liver Function Tests: No  results for input(s): AST, ALT, ALKPHOS, BILITOT, PROT, ALBUMIN in the last 168 hours. No results for input(s): LIPASE, AMYLASE in the last 168 hours. No results for input(s): AMMONIA in the last 168 hours. Coagulation profile  Recent Labs Lab 12/04/16 0528  INR 1.13    CBC:  Recent Labs Lab 12/03/16 1445 12/03/16 1755 12/04/16 0528 12/05/16 0402  WBC 11.9* 10.9* 11.1* 10.8*  NEUTROABS 9.2*  --   --   --   HGB 11.7* 11.5* 11.5* 11.0*  HCT 36.3 35.2* 36.1 34.3*  MCV 96.5 94.6 95.8 95.5  PLT 197 203 216 216   Cardiac Enzymes:  Recent Labs Lab 12/03/16 1755 12/03/16 2236 12/04/16 0528  TROPONINI 3.00* 2.69* 2.87*   BNP: Invalid input(s): POCBNP CBG:  Recent Labs Lab 12/04/16 0757 12/04/16 1157 12/04/16 2156 12/04/16 2322 12/05/16 0612  GLUCAP 111* 110* 110* 112* 118*   D-Dimer No results for input(s): DDIMER in the last 72 hours. Hgb A1c  Recent Labs  12/03/16 1445  HGBA1C 5.4   Lipid Profile  Recent Labs  12/04/16 0528  CHOL 171  HDL 54  LDLCALC 108*  TRIG 44  CHOLHDL 3.2   Thyroid function studies No results for input(s): TSH, T4TOTAL, T3FREE, THYROIDAB in the last 72 hours.  Invalid input(s): FREET3 Anemia work up No results for input(s): VITAMINB12, FOLATE, FERRITIN, TIBC, IRON, RETICCTPCT in the last 72 hours. Microbiology Recent Results (from the past 240 hour(s))  MRSA PCR Screening     Status: None   Collection Time: 12/04/16  4:22 AM  Result Value Ref Range Status   MRSA by PCR NEGATIVE NEGATIVE Final    Comment:        The GeneXpert MRSA Assay (FDA approved for NASAL specimens only), is one component of a comprehensive MRSA colonization surveillance program. It is not intended to diagnose MRSA infection nor to guide or monitor treatment for MRSA infections.      Discharge Instructions:   Discharge Instructions    Amb Referral to Cardiac Rehabilitation    Complete by:  As directed    Diagnosis:    PTCA NSTEMI Coronary Stents     Diet - low sodium heart healthy    Complete by:  As directed    Diet Carb Modified    Complete by:  As directed    Discharge instructions    Complete by:  As directed    Upon d/c, go straight to dialysis unit for HD today Referral made for cardiac rehab   Increase activity slowly    Complete by:  As directed      Allergies as of 12/05/2016      Reactions   Sulfa Drugs Cross Reactors Other (See Comments)  Doesn't remember       Medication List    STOP taking these medications   insulin aspart 100 UNIT/ML FlexPen Commonly known as:  NOVOLOG FLEXPEN   Insulin Glargine 100 UNIT/ML Solostar Pen Commonly known as:  LANTUS SOLOSTAR   Insulin Pen Needle 30G X 8 MM Misc Commonly known as:  NOVOFINE     TAKE these medications   albuterol 108 (90 Base) MCG/ACT inhaler Commonly known as:  PROVENTIL HFA;VENTOLIN HFA Inhale 2 puffs into the lungs every 6 (six) hours as needed for wheezing or shortness of breath.   aspirin 81 MG EC tablet Take 1 tablet (81 mg total) by mouth daily. Start taking on:  12/06/2016   atorvastatin 80 MG tablet Commonly known as:  LIPITOR Take 1 tablet (80 mg total) by mouth daily at 6 PM.   b complex vitamins capsule Take 1 capsule by mouth daily.   calcium acetate 667 MG capsule Commonly known as:  PHOSLO Take 667 mg by mouth See admin instructions. Take 667 mg by mouth 3 times daily with meals and take 667 mg by mouth with snacks.   DSS 100 MG Caps Take 200 mg by mouth 2 (two) times daily. What changed:  how much to take  when to take this   fish oil-omega-3 fatty acids 1000 MG capsule Take 1 g by mouth daily.   labetalol 200 MG tablet Commonly known as:  NORMODYNE Take 1 tablet (200 mg total) by mouth 2 (two) times daily.   meclizine 25 MG tablet Commonly known as:  ANTIVERT Take 1/2 tablet q 6h prn vertigo What changed:  how much to take  how to take this  when to take this  reasons to take  this  additional instructions   omeprazole 20 MG capsule Commonly known as:  PRILOSEC Take 20 mg by mouth daily.   ticagrelor 90 MG Tabs tablet Commonly known as:  BRILINTA Take 1 tablet (90 mg total) by mouth 2 (two) times daily.      Follow-up Information    Angelena Form, PA-C Follow up on 12/17/2016.   Specialties:  Cardiology, Radiology Why:  Cardiology Hospital Follow-Up on 12/07/2016 at 9:00AM (Dr. Theodosia Blender Office). Contact information: [address] [place], [place] [address] [phone]        Maximino Greenland, MD Follow up in 1 week(s).   Specialty:  Internal Medicine Contact information: [address] [place] [address] [phone]            Time coordinating discharge: 35 min  Signed:  Eppie Barhorst Alison Stalling   [facility] 12/05/2016, 11:18 AM

## 2016-12-05 NOTE — Progress Notes (Signed)
CARDIAC REHAB PHASE I   PRE:  Rate/Rhythm: 71 SR    BP: sitting 130/58    SaO2:   MODE:  Ambulation: 84 ft   POST:  Rate/Rhythm: 92 SR    BP: sitting 131/52     SaO2:   Pt weak. To BR for BSC then to edge of bed for several minutes. Then ambulated with RW 84 ft. Pt generally weak. She only walks in house. Has RW for home. Encouraged increasing activity as tolerated. Reviewed Brilinta/ASA. Discussed NTG and CRPII. Will refer to [facility] but she sts transport might be a problem.  [place], ACSM 12/05/2016 8:39 AM

## 2016-12-05 NOTE — Progress Notes (Signed)
Cardiology to see this AM-- will try to arrange outpatient dialysis at center on [address] this PM vs here in hospital. Jacqueline [name]

## 2016-12-05 NOTE — Care Management Note (Addendum)
Case Management Note  Patient Details  Name: MIAMOR AYLER MRN: [id number] Date of Birth: 1946/02/09  Subjective/Objective:    S/p stent intervention, NCM gave patient the 30 day free savings card for brilinta, still awaiting benefit check.  She lives with her spouse at home and she uses a walker at home, she has a pcp and she will have transportation at dc, no problem with getting meds with her insurance.    She will get her 30 day free from [facility] on [address] and they have in stock.             Action/Plan:   Expected Discharge Date:                  Expected Discharge Plan:  Home/Self Care  In-House Referral:     Discharge planning Services  CM Consult  Post Acute Care Choice:    Choice offered to:     DME Arranged:    DME Agency:     HH Arranged:    HH Agency:     Status of Service:  Completed, signed off  If discussed at [employer] of Stay Meetings, dates discussed:    Additional Comments:  Zenon Mayo, RN 12/05/2016, 10:18 AM

## 2016-12-05 NOTE — Progress Notes (Signed)
S/W SHAN @ [facility] # [id number]   BRILINTA 90 MG BID 30 / 60 TAB   COVER- YES  CO-PAY- $ 64.00  TIER- 3 DRUG  PRIOR APPROVAL- NO  PHARMACY : [facility]

## 2016-12-05 NOTE — Telephone Encounter (Signed)
[name] is a TOC patient , appointment is January 16th @ 9:00am with PA, Angelena Form. Thanks.

## 2016-12-05 NOTE — Progress Notes (Signed)
Patient Name: Jacqueline [name] Date of Encounter: 12/05/2016  Primary Cardiologist: Dr. Sundra Aland Problem List     Principal Problem:   Chest pain Active Problems:   ESRD (end stage renal disease) on dialysis [facility])   Hypertension   OSA (obstructive sleep apnea)   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[place])   NSTEMI (non-ST elevated myocardial infarction) (HCC)     Subjective   No complaints of chest pain or SOB  Inpatient Medications    Scheduled Meds: . aspirin EC  81 mg Oral Daily  . atorvastatin  80 mg Oral q1800  . calcium acetate  667 mg Oral TID WC  . docusate sodium  100 mg Oral QHS  . loratadine  10 mg Oral Daily  . omega-3 acid ethyl esters  1 g Oral Daily  . pantoprazole  40 mg Oral Daily  . sodium chloride flush  3 mL Intravenous Q12H  . sodium chloride flush  3 mL Intravenous Q12H  . ticagrelor  90 mg Oral BID   Continuous Infusions:  PRN Meds: sodium chloride, sodium chloride, acetaminophen, nitroGLYCERIN, ondansetron (ZOFRAN) IV, sodium chloride flush, sodium chloride flush   Vital Signs    Vitals:   12/05/16 0300 12/05/16 0400 12/05/16 0600 12/05/16 0809  BP: (!) 135/42 (!) 132/46 (!) 140/46 (!) 130/58  Pulse: 74 69 66 73  Resp: 19 18 20  (!) 23  Temp:   98.6 F (37 C) 98.6 F (37 C)  TempSrc:   Oral Oral  SpO2: 100% 91% 96% 95%  Weight:   231 lb 11.3 oz (105.1 kg)   Height:        Intake/Output Summary (Last 24 hours) at 12/05/16 1000 Last data filed at 12/05/16 0900  Gross per 24 hour  Intake              650 ml  Output                0 ml  Net              650 ml   Filed Weights   12/04/16 0455 12/04/16 1948 12/05/16 0600  Weight: 231 lb 9.6 oz (105.1 kg) 231 lb 11.3 oz (105.1 kg) 231 lb 11.3 oz (105.1 kg)    Physical Exam    GEN: Well nourished, well developed, in no acute distress.  HEENT: Grossly normal.  Neck: Supple, no JVD, carotid bruits, or masses. Cardiac: RRR, no murmurs, rubs, or gallops. No clubbing,  cyanosis, edema.  Radials/DP/PT 2+ and equal bilaterally.  Respiratory:  Respirations regular and unlabored, clear to auscultation bilaterally. GI: Soft, nontender, nondistended, BS + x 4. MS: no deformity or atrophy. Skin: warm and dry, no rash. Neuro:  Strength and sensation are intact. Psych: AAOx3.  Normal affect.  Labs    CBC  Recent Labs  12/03/16 1445  12/04/16 0528 12/05/16 0402  WBC 11.9*  < > 11.1* 10.8*  NEUTROABS 9.2*  --   --   --   HGB 11.7*  < > 11.5* 11.0*  HCT 36.3  < > 36.1 34.3*  MCV 96.5  < > 95.8 95.5  PLT 197  < > 216 216  < > = values in this interval not displayed. Basic Metabolic Panel  Recent Labs  12/04/16 0528 12/05/16 0402  NA 141 137  K 4.1 4.6  CL 97* 95*  CO2 31 27  GLUCOSE 127* 158*  BUN 31* 41*  CREATININE 7.39* 9.96*  CALCIUM 9.2 9.1  Liver Function Tests No results for input(s): AST, ALT, ALKPHOS, BILITOT, PROT, ALBUMIN in the last 72 hours. No results for input(s): LIPASE, AMYLASE in the last 72 hours. Cardiac Enzymes  Recent Labs  12/03/16 1755 12/03/16 2236 12/04/16 0528  TROPONINI 3.00* 2.69* 2.87*   BNP Invalid input(s): POCBNP D-Dimer No results for input(s): DDIMER in the last 72 hours. Hemoglobin A1C  Recent Labs  12/03/16 1445  HGBA1C 5.4   Fasting Lipid Panel  Recent Labs  12/04/16 0528  CHOL 171  HDL 54  LDLCALC 108*  TRIG 44  CHOLHDL 3.2   Thyroid Function Tests No results for input(s): TSH, T4TOTAL, T3FREE, THYROIDAB in the last 72 hours.  Invalid input(s): FREET3  Telemetry    NSR - Personally Reviewed  ECG    NSR - Personally Reviewed  Radiology    Dg Chest 2 View  Result Date: 12/03/2016 CLINICAL DATA:  Chest pressure. Chronic renal failure. Cardiac arrhythmia EXAM: CHEST  2 VIEW COMPARISON:  January 25, 2014 FINDINGS: There is no edema or consolidation. Heart is upper normal in size with pulmonary vascularity within normal limits. No adenopathy. There is atherosclerotic  calcification in the aorta. There is degenerative change in the thoracic spine peer IMPRESSION: Aortic atherosclerosis.  No edema or consolidation. Electronically Signed   By: Lowella Grip III M.D.   On: 12/03/2016 13:42    Cardiac Studies   Conclusion     The left ventricular ejection fraction is greater than 65% by visual estimate.  The left ventricular systolic function is normal.  LV end diastolic pressure is normal.  There is no mitral valve regurgitation.  2nd Mrg lesion, 20 %stenosed.  3rd Mrg lesion, 20 %stenosed.  1st Diag lesion, 20 %stenosed.  Prox LAD to Dist LAD lesion, 20 %stenosed.  Mid RCA to Dist RCA lesion, 20 %stenosed.  A STENT RESOLUTE ONYX 3.0X18 drug eluting stent was successfully placed.  Mid RCA lesion, 80 %stenosed.  Post intervention, there is a 0% residual stenosis.   1. NSTEMI secondary to ulcerated plaque in the mid RCA 2. Mild disease in the LAD and Circumflex 3. Normal LV systolic function 4. Successful PTCA/DES x 1 mid RCA  Recommendations: Will continue DAPT with ASA and Brilinta for at least one year. Will start high dose statin. Beta blocker as tolerated.      Patient Profile     71yo female with a history of ESRD on HD, hyperlipidemia, HTN and obesity who presented to the ER with complaints of CP.  She was at HD and waiting for her husband to pick her up and developed chest discomfort described as a pressure sensation across her chest associated with SOB and nausea.  She went back inside the HD center and was placed on O2.  EMS was called and by the time she arrived in the ER her pain was gone.  The pain lasted about 15 minutes.  She has no prior cardiac history but does have a history of GERD.  Her initial trop PCO at 3pm was normal at 0.03 in the ER but subsequent troponin increased to 3.  She has not had any further CP since admission.  Cardiology is now asked to consult.  She denies any prior heart problems or CP prior to  todays event.   Assessment & Plan    1.  NSTEMI with elevated trop at 3 after only 15 minutes of CP right after HD.  EKG is nonischemic. Cath showed 80% mid RCA and nonobstructive  CAD 20% elsewhere s/p DES to the RCA.  Continue DAPT with ASA and Brilinta.  Continue high dose statin. Restart home dose of labetolol.  2.  HTN - BP controlled on BB 3.  Hyperlipidemia with LDL goal < 70.  Continue high dose statin.  Will need FLp and ALT in 6 weeks.   4.  ESRD per nephrology.   Patient stable from cardiac standpoint for discharge.  Will set up TOC visit and lipids in 6 months.      Signed, Fransico Him, MD  12/05/2016, 10:00 AM

## 2016-12-05 NOTE — Progress Notes (Signed)
Site area: right groin  Site Prior to Removal:  Level 0  Pressure Applied For 20 MINUTES    Minutes Beginning at 0135  Manual:   Yes.    Patient Status During Pull:  stable  Post Pull Groin Site:  Level 0  Post Pull Instructions Given:  Yes.    Post Pull Pulses Present:  Yes.    Dressing Applied:  Yes.    Comments:

## 2016-12-06 DIAGNOSIS — I214 Non-ST elevation (NSTEMI) myocardial infarction: Secondary | ICD-10-CM | POA: Insufficient documentation

## 2016-12-06 DIAGNOSIS — R079 Chest pain, unspecified: Secondary | ICD-10-CM | POA: Insufficient documentation

## 2016-12-06 NOTE — Telephone Encounter (Signed)
Left message for patient to call back TCM appointment 1/16 @ 0900 with K. Grandville Silos, [place]; discharged 12/05/16 from [facility]

## 2016-12-09 MED ORDER — NITROGLYCERIN 0.4 MG SL SUBL: 0.4000 mg | SUBLINGUAL_TABLET | SUBLINGUAL | 3 refills | Status: DC | PRN

## 2016-12-09 NOTE — Addendum Note (Signed)
Addended by: Barkley Boards on: 12/09/2016 09:29 AM   Modules accepted: Orders

## 2016-12-09 NOTE — Telephone Encounter (Signed)
Please forward to [facility] to find out what price it on generic atorvastatin   Fransico Him    I contacted [facility] and Atorvastatin was going to cost the pt $50.00 out of pocket and this included using the [facility] discount card. Will forward to Dr Radford Pax.

## 2016-12-09 NOTE — Telephone Encounter (Signed)
Patient contacted regarding discharge from [facility] on 12/05/16..  Patient understands to follow up with provider Lyda Jester PA on 12/18/16 at 9:30 AM at [facility] office. Patient understands discharge instructions? yes Patient understands medications and regiment? yes Patient understands to bring all medications to this visit? yes  In reviewing the pt's follow-up appointment the pt cannot do 12/17/16 since this is her dialysis day.  I rescheduled her appointment to 12/18/16.   Reviewed discharge medications and the pt states that they gave her instructions about using PRN Nitroglycerin but she does not have a prescription.  The pt requested that Rx be sent to [facility] and depending upon cost she may or may not pick up.  The pt also cannot afford the cost of Atorvastatin and request that a different cholesterol medication be prescribed.  The pt has not taken Labetalol since leaving the hospital but she was able to locate this medication and I advised her that per DC instructions this medication was restarted.  The pt will not take on dialysis days.   The pt is new to our office and Dr Radford Pax will be cardiologist.  I will forward message to Dr Radford Pax and Lenice Llamas RN to follow-up in regards to starting a different statin due to cost.

## 2016-12-11 ENCOUNTER — Telehealth: Payer: Self-pay | Admitting: Physician Assistant

## 2016-12-11 NOTE — Telephone Encounter (Signed)
Patient needs to bring her updated insurance [facility] to [facility], they have her old one on file. The $50 copay just accounts for using the copay card, not pt's insurance. Pt may have a deductible to meet, but monthly copays for [facility] Medicare Part D plans look to be $1-2 per month for any strength of generic atorvastatin. Pt is aware and will call with any questions.

## 2016-12-11 NOTE — Telephone Encounter (Signed)
New message   [facility] calling needs clarification on patient discharge medication - ASA.

## 2016-12-11 NOTE — Telephone Encounter (Signed)
Returned the call to [facility] and spoke with 5 different departments and noone knew why the call was placed.  If they call back, please try to get direct extension.  Thanks!

## 2016-12-12 ENCOUNTER — Telehealth: Payer: Self-pay | Admitting: Cardiology

## 2016-12-12 NOTE — Telephone Encounter (Signed)
New Message   atorvastatin (LIPITOR) 80 MG tablet Take 1 tablet (80 mg total) by mouth daily at 6 PM.   labetalol (NORMODYNE) 200 MG tablet Take 1 tablet (200 mg total) by mouth 2 (two) times daily. Patient not taking: Reported on 12/03/2016   Per Patient Jacqueline [name] does not want her taking this medication says it dropping her blood pressure too low

## 2016-12-17 ENCOUNTER — Ambulatory Visit: Payer: Medicare Other | Admitting: Physician Assistant

## 2016-12-18 ENCOUNTER — Ambulatory Visit: Payer: Medicare Other | Admitting: Cardiology

## 2016-12-20 ENCOUNTER — Telehealth: Payer: Self-pay | Admitting: Nurse Practitioner

## 2016-12-20 NOTE — Telephone Encounter (Signed)
Patient called with advice per pharmacy staff. She voiced understanding.

## 2016-12-20 NOTE — Telephone Encounter (Signed)
Brilinta is NOT removed by dialysis - okay to take prior to dialysis to accomodates twice daily dose.  Aspirin is 50-60% removed by dialysis - need to take after HD on dialysis days.

## 2016-12-20 NOTE — Telephone Encounter (Signed)
New Message   Pt wants to know if she needs to take asprin every time she takes medication  ticagrelor (BRILINTA) 90 MG TABS tablet or can she take asprin at any time. Would like call back.

## 2016-12-20 NOTE — Telephone Encounter (Signed)
Returned call patient.  Informed her that Kary Kos is a twice daily medication - every 12 hours and she is to take aspirin 81mg  once daily She states she has been doing this  She asked if she should take her Brilinta the last 30 minutes of dialysis on the days she goes for this. She states a nurse instructed her to do so. She states she takes all her other medications AFTER dialysis. Informed her that I would have to get clarification on this myself.   Will ask pharmacy for assistance with this and contact patient accordingly

## 2016-12-23 NOTE — Telephone Encounter (Signed)
Patient states she was taken off Labetalol 200 mg BID at dialysis because her blood pressure was too low. She has not resumed the medication. She reports her last BP was 120s/60s-70s. She understands to bring all medication to her OV with Ignacia Bayley on Wednesday. She was grateful for call.  Med list updated.

## 2016-12-25 ENCOUNTER — Encounter: Payer: Self-pay | Admitting: Nurse Practitioner

## 2016-12-25 ENCOUNTER — Ambulatory Visit (INDEPENDENT_AMBULATORY_CARE_PROVIDER_SITE_OTHER): Payer: Medicare Other | Admitting: Nurse Practitioner

## 2016-12-25 VITALS — BP 145/63 | HR 69 | Ht 65.0 in | Wt 225.4 lb

## 2016-12-25 DIAGNOSIS — N186 End stage renal disease: Secondary | ICD-10-CM

## 2016-12-25 DIAGNOSIS — I214 Non-ST elevation (NSTEMI) myocardial infarction: Secondary | ICD-10-CM

## 2016-12-25 DIAGNOSIS — E782 Mixed hyperlipidemia: Secondary | ICD-10-CM | POA: Diagnosis not present

## 2016-12-25 DIAGNOSIS — I10 Essential (primary) hypertension: Secondary | ICD-10-CM

## 2016-12-25 DIAGNOSIS — I2510 Atherosclerotic heart disease of native coronary artery without angina pectoris: Secondary | ICD-10-CM

## 2016-12-25 NOTE — Patient Instructions (Addendum)
Medication Instructions:  None ordered  Labwork: 8 WEEKS:  FASTING LIPID & LFT  Testing/Procedures: None ordered  Follow-Up: Your physician recommends that you schedule a follow-up appointment in: 3 MONTHS WITH DR. Radford Pax   Any Other Special Instructions Will Be Listed Below (If Applicable).    If you need a refill on your cardiac medications before your next appointment, please call your pharmacy.

## 2016-12-25 NOTE — Progress Notes (Signed)
Office Visit    Patient Name: Jacqueline [name] Date of Encounter: 12/25/2016  Primary Care Provider:  Maximino Greenland, MD Primary Cardiologist:  T. Radford Pax, MD   Chief Complaint    71 y/o ? with a history of hypertension, hyperlipidemia, obesity, end-stage renal disease on hemodialysis, and diabetes, who was recently admitted with non-ST segment elevation myocardial infarction and underwent stenting of the right coronary artery.  Past Medical History    Past Medical History:  Diagnosis Date  . Arthritis    "left knee" (12/04/2016)  . CAD (coronary artery disease)    a. 12/2016 NSTEMI/PCI: LM nl, LaD 20p/d, D1 20, OM2 20, OM3 20, RCA 40m (3.0x18 Resolute DES). EF 65%.  . Chronic lower back pain   . ESRD on dialysis [facility])    "[facility]; [place]; TTS" (12/04/2016)  . GERD (gastroesophageal reflux disease)   . Hyperlipidemia   . Hypertension    a. 12/2016 labetalol d/c'd 2/2 hypotension in HD.  . Morbid obesity ([place])   . Obesity   . Pneumonia    "couple times" (12/04/2016)  . Type II diabetes mellitus ([place])    Past Surgical History:  Procedure Laterality Date  . ABDOMINAL HYSTERECTOMY    . AV FISTULA PLACEMENT Left 01/27/2014   Procedure: ARTERIOVENOUS (AV) FISTULA CREATION;  Surgeon: Mal Misty, MD;  Location: [place];  Service: Vascular;  Laterality: Left;  . AV FISTULA REPAIR     "had it cleaned out"  . CARDIAC CATHETERIZATION  ?1980s  . CARDIAC CATHETERIZATION N/A 12/04/2016   Procedure: Left Heart Cath Angiography;  Surgeon: Burnell Blanks, MD;  Location: [place] CV LAB;  Service: Cardiovascular;  Laterality: N/A;  . CARDIAC CATHETERIZATION N/A 12/04/2016   Procedure: Coronary Stent Intervention;  Surgeon: Burnell Blanks, MD;  Location: [name] CV LAB;  Service: Cardiovascular;  Laterality: N/A;  Mid RCA  . CATARACT EXTRACTION W/ INTRAOCULAR LENS IMPLANT Left   . COLONOSCOPY W/ BIOPSIES AND POLYPECTOMY    . CORONARY ANGIOPLASTY WITH  STENT PLACEMENT  12/04/2016  . ESOPHAGOGASTRODUODENOSCOPY    . INSERTION OF DIALYSIS CATHETER N/A 01/19/2014   Procedure: INSERTION OF DIALYSIS CATHETER;  Surgeon: Rosetta Posner, MD;  Location[facility];  Service: Vascular;  Laterality: N/A;    Allergies  Allergies  Allergen Reactions  . Sulfa Drugs Cross Reactors Other (See Comments)    Doesn't remember     History of Present Illness    71 year old female with the above complex past medical history including hypertension, hyperlipidemia, diabetes, and end-stage renal disease on hemodialysis. She was recently admitted to [facility] with chest pain and ruled in for non-STEMI. Catheterization revealed severe, ulcerated disease within the right coronary artery. LV function was normal. The RCA was successfully treated w/ a DES. Since d/c, she has done well w/o c/p or dyspnea.  She has had hypotension in dialysis with systolic dropping to 60 per her report. In that setting, her labetalol was d/c'd.  She has a cuff @ home but does not check her pressure.  She denies pnd, orthopnea, n, v, dizziness, syncope, edema, weight gain, or early satiety and is otw tolerating meds well.  Home Medications    Prior to Admission medications   Medication Sig Start Date End Date Taking? Authorizing Provider  aspirin EC 81 MG EC tablet Take 1 tablet (81 mg total) by mouth daily. 12/06/16  Yes Geradine Girt, DO  atorvastatin (LIPITOR) 80 MG tablet Take 1 tablet (  80 mg total) by mouth daily at 6 PM. 12/05/16  Yes Geradine Girt, DO  b complex vitamins capsule Take 1 capsule by mouth daily.   Yes Historical Provider, MD  calcium acetate (PHOSLO) 667 MG capsule Take 667 mg by mouth See admin instructions. Take 667 mg by mouth 3 times daily with meals and take 667 mg by mouth with snacks. 10/21/16  Yes Historical Provider, MD  docusate sodium (COLACE) 100 MG capsule Take 100 mg by mouth 2 (two) times daily.   Yes Historical Provider, MD  fish oil-omega-3 fatty acids 1000 MG  capsule Take 1 g by mouth daily.     Yes Historical Provider, MD  folic acid-vitamin b complex-vitamin c-selenium-zinc (DIALYVITE) 3 MG TABS tablet Take 1 tablet by mouth daily.   Yes Historical Provider, MD  meclizine (ANTIVERT) 25 MG tablet Take 25 mg by mouth 3 (three) times daily as needed for dizziness.   Yes Historical Provider, MD  metoCLOPramide (REGLAN) 10 MG tablet Take 10 mg by mouth 2 (two) times daily.   Yes Historical Provider, MD  nitroGLYCERIN (NITROSTAT) 0.4 MG SL tablet Place 1 tablet (0.4 mg total) under the tongue every 5 (five) minutes as needed for chest pain. 12/09/16 03/09/17 Yes Sueanne Margarita, MD  omeprazole (PRILOSEC) 20 MG capsule Take 20 mg by mouth daily. 10/23/16  Yes Historical Provider, MD  pravastatin (PRAVACHOL) 20 MG tablet Take 20 mg by mouth daily.   Yes Historical Provider, MD  ticagrelor (BRILINTA) 90 MG TABS tablet Take 1 tablet (90 mg total) by mouth 2 (two) times daily. 12/05/16  Yes Geradine Girt, DO  albuterol (PROVENTIL HFA;VENTOLIN HFA) 108 (90 BASE) MCG/ACT inhaler Inhale 2 puffs into the lungs every 6 (six) hours as needed for wheezing or shortness of breath.    Historical Provider, MD    Review of Systems    She denies chest pain, palpitations, dyspnea, pnd, orthopnea, n, v, dizziness, syncope, edema, weight gain, or early satiety.  All other systems reviewed and are otherwise negative except as noted above.  Physical Exam    VS:  BP (!) 145/63 (BP Location: Right Arm, Patient Position: Sitting, Cuff Size: Large)   Pulse 69   Ht 5\' 5"  (1.651 m)   Wt 225 lb 6.4 oz (102.2 kg)   BMI 37.51 kg/m  , BMI Body mass index is 37.51 kg/m. GEN: obese, pleasant, in no acute distress.  HEENT: normal.  Neck: obese, difficult to gauge JVP, no carotid bruits, or masses. Cardiac: RRR, 2/6 syst murmur @ RUSB, no rubs, or gallops. No clubbing, cyanosis, edema.  Radials/DP/PT 2+ and equal bilaterally.  Respiratory:  Respirations regular and unlabored, clear to  auscultation bilaterally. GI: Soft, nontender, nondistended, BS + x 4. MS: no deformity or atrophy. Skin: warm and dry, no rash. Neuro:  Strength and sensation are intact. Psych: Normal affect.  Accessory Clinical Findings     ECG - RSR, 69, no acute st/t changes.  Assessment & Plan    1.  NSTEMI, subsequent episode of care/CAD:  S/p recent admission for c/p and nstemi.  Cath showed severe RCA dzs  s/p DES.  She has been doing well w/o chest pain or dyspnea and remains on asa, statin, and brilinta.  She is tolerating meds well.  She had been on labetalol but this was dc'd in the setting of hypotension during dialysis.  She is considering cardiac rehab.  2.  Essential HTN:  bp up today but has been low  in HD, resulting in labetalol being d/c'd.  I've asked her to follow bp @ home.  She may tolerate low dose  blocker on non-dialysis days (did not tolerate labetalol 100 on non-hd days).  3. HL:  LDL 108 on 1/3.  Tolerating lipitor 80 and now says that she'll be able to afford it w/o issues.  F/u lipids/lft's in 6 wks.  4.  ESRD:  HD TTS per nephrology.  5.  DM II: A1c 5.4.  She is on no meds.  6. Dispo:  F/u lipids/lft's in 6 wks.  F/u with Dr. Radford Pax in 3 mos.  Murray Hodgkins, NP 12/25/2016, 4:32 PM

## 2016-12-31 ENCOUNTER — Encounter (HOSPITAL_COMMUNITY): Payer: Self-pay | Admitting: Emergency Medicine

## 2016-12-31 ENCOUNTER — Emergency Department (HOSPITAL_COMMUNITY)
Admission: EM | Admit: 2016-12-31 | Discharge: 2016-12-31 | Disposition: A | Discharge status: Home / Self Care | Payer: Medicare Other | Attending: Emergency Medicine | Admitting: Emergency Medicine

## 2016-12-31 ENCOUNTER — Emergency Department (HOSPITAL_COMMUNITY): Payer: Medicare Other

## 2016-12-31 ENCOUNTER — Other Ambulatory Visit: Payer: Self-pay

## 2016-12-31 DIAGNOSIS — Z992 Dependence on renal dialysis: Secondary | ICD-10-CM | POA: Insufficient documentation

## 2016-12-31 DIAGNOSIS — R000 Tachycardia, unspecified: Secondary | ICD-10-CM | POA: Insufficient documentation

## 2016-12-31 DIAGNOSIS — I252 Old myocardial infarction: Secondary | ICD-10-CM | POA: Diagnosis not present

## 2016-12-31 DIAGNOSIS — I5031 Acute diastolic (congestive) heart failure: Secondary | ICD-10-CM | POA: Insufficient documentation

## 2016-12-31 DIAGNOSIS — E1122 Type 2 diabetes mellitus with diabetic chronic kidney disease: Secondary | ICD-10-CM | POA: Insufficient documentation

## 2016-12-31 DIAGNOSIS — Z7982 Long term (current) use of aspirin: Secondary | ICD-10-CM | POA: Insufficient documentation

## 2016-12-31 DIAGNOSIS — Z87891 Personal history of nicotine dependence: Secondary | ICD-10-CM | POA: Diagnosis not present

## 2016-12-31 DIAGNOSIS — I2510 Atherosclerotic heart disease of native coronary artery without angina pectoris: Secondary | ICD-10-CM | POA: Insufficient documentation

## 2016-12-31 DIAGNOSIS — N186 End stage renal disease: Secondary | ICD-10-CM | POA: Insufficient documentation

## 2016-12-31 DIAGNOSIS — Z79899 Other long term (current) drug therapy: Secondary | ICD-10-CM | POA: Insufficient documentation

## 2016-12-31 DIAGNOSIS — R0602 Shortness of breath: Secondary | ICD-10-CM | POA: Diagnosis present

## 2016-12-31 DIAGNOSIS — I132 Hypertensive heart and chronic kidney disease with heart failure and with stage 5 chronic kidney disease, or end stage renal disease: Secondary | ICD-10-CM | POA: Diagnosis not present

## 2016-12-31 HISTORY — DX: Disorder of kidney and ureter, unspecified: N28.9

## 2016-12-31 LAB — BASIC METABOLIC PANEL
Anion gap: 14 (ref 5–15)
BUN: 14 mg/dL (ref 6–20)
CHLORIDE: 93 mmol/L — AB (ref 101–111)
CO2: 29 mmol/L (ref 22–32)
CREATININE: 4.45 mg/dL — AB (ref 0.44–1.00)
Calcium: 9.2 mg/dL (ref 8.9–10.3)
GFR, EST AFRICAN AMERICAN: 10 mL/min — AB (ref >60)
GFR, EST NON AFRICAN AMERICAN: 9 mL/min — AB (ref >60)
Glucose, Bld: 213 mg/dL — ABNORMAL HIGH (ref 65–99)
Potassium: 3.4 mmol/L — ABNORMAL LOW (ref 3.5–5.1)
Sodium: 136 mmol/L (ref 135–145)

## 2016-12-31 LAB — CBC
HCT: 36.2 % (ref 36.0–46.0)
Hemoglobin: 11.8 g/dL — ABNORMAL LOW (ref 12.0–15.0)
MCH: 31 pg (ref 26.0–34.0)
MCHC: 32.6 g/dL (ref 30.0–36.0)
MCV: 95 fL (ref 78.0–100.0)
PLATELETS: 296 10*3/uL (ref 150–400)
RBC: 3.81 MIL/uL — AB (ref 3.87–5.11)
RDW: 13.8 % (ref 11.5–15.5)
WBC: 11.3 10*3/uL — AB (ref 4.0–10.5)

## 2016-12-31 LAB — I-STAT TROPONIN, ED: TROPONIN I, POC: 0.07 ng/mL (ref 0.00–0.08)

## 2016-12-31 LAB — CBG MONITORING, ED: Glucose-Capillary: 115 mg/dL — ABNORMAL HIGH (ref 65–99)

## 2016-12-31 MED ORDER — IOPAMIDOL (ISOVUE-370) INJECTION 76%
100.0000 mL | Freq: Once | INTRAVENOUS | Status: AC | PRN
  Administered 2016-12-31: 80 mL via INTRAVENOUS

## 2016-12-31 MED ORDER — SODIUM CHLORIDE 0.9 % IV BOLUS (SEPSIS)
250.0000 mL | Freq: Once | INTRAVENOUS | Status: AC
  Administered 2016-12-31: 250 mL via INTRAVENOUS

## 2016-12-31 NOTE — ED Notes (Signed)
Patient verbalized understanding of discharge instructions and denies any further needs or questions at this time. VS stable. Patient ambulatory with steady gait, RN escorted to ED entrance in wheelchair.

## 2016-12-31 NOTE — Discharge Instructions (Signed)
Follow up with your cardiologist regarding the tachycardia episode.   Follow up with your doctor routinely to discuss whether or not to have a follow up CT scan as suggested by the radiologist.  Return as needed for worsening symptoms

## 2016-12-31 NOTE — ED Triage Notes (Signed)
Pt c/o SOB since today, pt appears in NAD. Pt HR 133 in triage. Pt took 1 nitro with relief. Hx of MI

## 2016-12-31 NOTE — ED Notes (Signed)
ED Provider at bedside. 

## 2016-12-31 NOTE — ED Notes (Signed)
Pt's CBG result was 115. Informed Brooke - Therapist, sports.

## 2016-12-31 NOTE — ED Notes (Signed)
Care handoff to [facility]

## 2016-12-31 NOTE — ED Notes (Signed)
MD at bedside. 

## 2016-12-31 NOTE — ED Provider Notes (Signed)
[facility] DEPT Provider Note   CSN: [id number] Arrival date & time: 12/31/16  1153     History   Chief Complaint Chief Complaint  Patient presents with  . Shortness of Breath    HPI Jacqueline [name] is a 71 y.o. female.  HPI Pt has chronic renal failure.  She was at routine dialysis today.   SHe was doing fine except her blood pressure was low.  She felt lightheaded but no other complaints.  This happens frequently during dialysis so she was not concerned about it.  About 1 hour later she started feeling short of breath and week.  NO chest pain.   SHe has history of recent MI on Jan 2nd.  She had a stent.  She did not have any heart rhythm issues.    Past Medical History:  Diagnosis Date  . Arthritis    "left knee" (12/04/2016)  . CAD (coronary artery disease)    a. 12/2016 NSTEMI/PCI: LM nl, LaD 20p/d, D1 20, OM2 20, OM3 20, RCA 48m (3.0x18 Resolute DES). EF 65%.  . Chronic lower back pain   . ESRD on dialysis [facility])    "[facility]; [place]; TTS" (12/04/2016)  . GERD (gastroesophageal reflux disease)   . Hyperlipidemia   . Hypertension    a. 12/2016 labetalol d/c'd 2/2 hypotension in HD.  . Morbid obesity ([place])   . Obesity   . Pneumonia    "couple times" (12/04/2016)  . Renal insufficiency   . Type II diabetes mellitus [facility])     Patient Active Problem List   Diagnosis Date Noted  . NSTEMI (non-ST elevated myocardial infarction) ([place])   . Chest pain 12/03/2016  . Pain in lower limb 08/26/2014  . End stage renal disease ([place]) 03/15/2014  . HCAP (healthcare-associated pneumonia) 02/13/2014  . 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 OSA (obstructive sleep apnea) 01/21/2014  . 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[place]) 01/21/2014  . Acute respiratory failure ([place]) 01/19/2014  . Acute encephalopathy 01/19/2014  . Congestive heart failure ([place]) 01/15/2014  . ESRD (end stage renal disease) on dialysis ([place]) 01/15/2014  . Hypertension 01/15/2014    . Diabetes mellitus ([place]) 01/15/2014  . Anemia 01/15/2014  . Acute diastolic CHF (congestive heart failure) ([place]) 01/15/2014  . Onychomycosis 03/26/2013  . Pain in joint, ankle and foot 03/26/2013    Past Surgical History:  Procedure Laterality Date  . ABDOMINAL HYSTERECTOMY    . AV FISTULA PLACEMENT Left 01/27/2014   Procedure: ARTERIOVENOUS (AV) FISTULA CREATION;  Surgeon: Mal Misty, MD;  Location: [facility];  Service: Vascular;  Laterality: Left;  . AV FISTULA REPAIR     "had it cleaned out"  . CARDIAC CATHETERIZATION  ?1980s  . CARDIAC CATHETERIZATION N/A 12/04/2016   Procedure: Left Heart Cath Angiography;  Surgeon: Burnell Blanks, MD;  Location: [place] CV LAB;  Service: Cardiovascular;  Laterality: N/A;  . CARDIAC CATHETERIZATION N/A 12/04/2016   Procedure: Coronary Stent Intervention;  Surgeon: Burnell Blanks, MD;  Location: [place] CV LAB;  Service: Cardiovascular;  Laterality: N/A;  Mid RCA  . CATARACT EXTRACTION W/ INTRAOCULAR LENS IMPLANT Left   . COLONOSCOPY W/ BIOPSIES AND POLYPECTOMY    . CORONARY ANGIOPLASTY WITH STENT PLACEMENT  12/04/2016  . ESOPHAGOGASTRODUODENOSCOPY    . INSERTION OF DIALYSIS CATHETER N/A 01/19/2014   Procedure: INSERTION OF DIALYSIS CATHETER;  Surgeon: Rosetta Posner, MD;  Location: [facility];  Service: Vascular;  Laterality: N/A;    OB History  No data available       Home Medications    Prior to Admission medications   Medication Sig Start Date End Date Taking? Authorizing Provider  albuterol (PROVENTIL HFA;VENTOLIN HFA) 108 (90 BASE) MCG/ACT inhaler Inhale 2 puffs into the lungs every 6 (six) hours as needed for wheezing or shortness of breath.    Historical Provider, MD  aspirin EC 81 MG EC tablet Take 1 tablet (81 mg total) by mouth daily. 12/06/16   Geradine Girt, DO  atorvastatin (LIPITOR) 80 MG tablet Take 1 tablet (80 mg total) by mouth daily at 6 PM. 12/05/16   Geradine Girt, DO  b complex vitamins capsule Take 1  capsule by mouth daily.    Historical Provider, MD  calcium acetate (PHOSLO) 667 MG capsule Take 667 mg by mouth See admin instructions. Take 667 mg by mouth 3 times daily with meals and take 667 mg by mouth with snacks. 10/21/16   Historical Provider, MD  docusate sodium (COLACE) 100 MG capsule Take 100 mg by mouth 2 (two) times daily.    Historical Provider, MD  fish oil-omega-3 fatty acids 1000 MG capsule Take 1 g by mouth daily.      Historical Provider, MD  folic acid-vitamin b complex-vitamin c-selenium-zinc (DIALYVITE) 3 MG TABS tablet Take 1 tablet by mouth daily.    Historical Provider, MD  meclizine (ANTIVERT) 25 MG tablet Take 25 mg by mouth 3 (three) times daily as needed for dizziness.    Historical Provider, MD  metoCLOPramide (REGLAN) 10 MG tablet Take 10 mg by mouth 2 (two) times daily.    Historical Provider, MD  nitroGLYCERIN (NITROSTAT) 0.4 MG SL tablet Place 1 tablet (0.4 mg total) under the tongue every 5 (five) minutes as needed for chest pain. 12/09/16 03/09/17  Sueanne Margarita, MD  omeprazole (PRILOSEC) 20 MG capsule Take 20 mg by mouth daily. 10/23/16   Historical Provider, MD  pravastatin (PRAVACHOL) 20 MG tablet Take 20 mg by mouth daily.    Historical Provider, MD  ticagrelor (BRILINTA) 90 MG TABS tablet Take 1 tablet (90 mg total) by mouth 2 (two) times daily. 12/05/16   Geradine Girt, DO    Family History Family History  Problem Relation Age of Onset  . Diabetes Mother   . Peripheral vascular disease Mother     amputation  . CAD Father   . Heart disease Father   . Hypertension Father   . Heart attack Father   . CAD Sister     Social History Social History  Substance Use Topics  . Smoking status: Former Smoker    Packs/day: 1.00    Years: 20.00    Types: Cigarettes    Quit date: 28  . Smokeless tobacco: Never Used  . Alcohol use No     Allergies   Sulfa drugs cross reactors   Review of Systems Review of Systems  All other systems reviewed and are  negative.    Physical Exam Updated Vital Signs BP (!) 123/51   Pulse 67   Temp 98 F (36.7 C) (Oral)   Resp 22   SpO2 100%   Physical Exam  Constitutional: She appears well-developed and well-nourished. No distress.  HENT:  Head: Normocephalic and atraumatic.  Right Ear: External ear normal.  Left Ear: External ear normal.  Eyes: Conjunctivae are normal. Right eye exhibits no discharge. Left eye exhibits no discharge. No scleral icterus.  Neck: Neck supple. No tracheal deviation present.  Cardiovascular: Regular  rhythm and intact distal pulses.  Tachycardia present.   Pulmonary/Chest: Effort normal and breath sounds normal. No stridor. No respiratory distress. She has no wheezes. She has no rales.  Abdominal: Soft. Bowel sounds are normal. She exhibits no distension. There is no tenderness. There is no rebound and no guarding.  Musculoskeletal: She exhibits no edema or tenderness.  Neurological: She is alert. She has normal strength. No cranial nerve deficit (no facial droop, extraocular movements intact, no slurred speech) or sensory deficit. She exhibits normal muscle tone. She displays no seizure activity. Coordination normal.  Skin: Skin is warm and dry. No rash noted.  Psychiatric: She has a normal mood and affect.  Nursing note and vitals reviewed.    ED Treatments / Results  Labs (all labs ordered are listed, but only abnormal results are displayed) Labs Reviewed  BASIC METABOLIC PANEL - Abnormal; Notable for the following:       Result Value   Potassium 3.4 (*)    Chloride 93 (*)    Glucose, Bld 213 (*)    Creatinine, Ser 4.45 (*)    GFR calc non Af Amer 9 (*)    GFR calc Af Amer 10 (*)    All other components within normal limits  CBC - Abnormal; Notable for the following:    WBC 11.3 (*)    RBC 3.81 (*)    Hemoglobin 11.8 (*)    All other components within normal limits  I-STAT TROPOININ, ED    EKG  31-Dec-2016 12:07:24 [facility]  ROUTINE RECORD  1st EKG narrow complex tachycardia, rate 134 Anterior infarct , age undetermined Abnormal ECG No significant change since last tracing Confirmed by Alexiana Laverdure MD-J, Makyah Lavigne [phone]) on 12/31/2016 1:22:51 PM    EKG Interpretation  Date/Time:  Tuesday December 31 2016 15:20:49 EST Ventricular Rate:  68 PR Interval:    QRS Duration: 108 QT Interval:  452 QTC Calculation: 481 R Axis:   75 Text Interpretation:  Sinus rhythm Ventricular premature complex Anterior infarct, old tachycardia resolved since last tracing Confirmed by Mako Pelfrey  MD-J, Brodrick Curran [phone]) on 12/31/2016 3:24:19 PM       Radiology Dg Chest 2 View  Result Date: 12/31/2016 CLINICAL DATA:  Shortness of Breath EXAM: CHEST  2 VIEW COMPARISON:  12/03/2016 FINDINGS: Heart is borderline in size. Lungs are clear. No effusions or edema. Degenerative changes in the thoracic spine IMPRESSION: No active cardiopulmonary disease. Electronically Signed   By: Rolm Baptise M.D.   On: 12/31/2016 12:45   Ct Angio Chest Pe W And/or Wo Contrast  Result Date: 12/31/2016 CLINICAL DATA:  Tachycardia, shortness of Breath EXAM: CT ANGIOGRAPHY CHEST WITH CONTRAST TECHNIQUE: Multidetector CT imaging of the chest was performed using the standard protocol during bolus administration of intravenous contrast. Multiplanar CT image reconstructions and MIPs were obtained to evaluate the vascular anatomy. CONTRAST:  80 cc Isovue COMPARISON:  None. FINDINGS: Cardiovascular: Atherosclerotic calcifications of thoracic aorta and coronary arteries. No aortic aneurysm is noted. Heart size within normal limits. There is trace posterior pericardial thickening or pericardial effusion. No pulmonary embolus is noted. Mediastinum/Nodes: No mediastinal hematoma or adenopathy. Central airways are patent. No hilar adenopathy is noted. Lungs/Pleura: Images of the lung parenchyma shows no infiltrate or pulmonary edema. Minimal dependent atelectasis noted in lung bases posteriorly.  Axial image 176 se 6 there is 4 mm pleural based nodule in right lower lobe posterolaterally. No pneumothorax. No bronchiectasis. No fibrotic changes. No emphysema. Upper Abdomen: The visualized upper abdomen  shows small hiatal hernia. The patient is status post cholecystectomy. Atherosclerotic calcifications of splenic artery. Musculoskeletal: No destructive bony lesions are noted. There are multilevel degenerative changes thoracic spine. Sagittal view of the sternum is unremarkable. Review of the MIP images confirms the above findings. IMPRESSION: 1. No pulmonary embolus is noted. 2. No segmental infiltrate or pulmonary edema. 3. No mediastinal hematoma or adenopathy. 4. Atherosclerotic calcifications of thoracic aorta and coronary artery. 5. Indeterminate 4 mm pleural based nodule in right lower lobe posteriorly best seen in axial image 176. No follow-up needed if patient is low-risk. Non-contrast chest CT can be considered in 12 months if patient is high-risk. This recommendation follows the consensus statement: Guidelines for Management of Incidental Pulmonary Nodules Detected on CT Images: From the Fleischner Society 2017; Radiology 2017; 284:228-243. Electronically Signed   By: Lahoma Crocker M.D.   On: 12/31/2016 15:01    Procedures Procedures (including critical care time)  Medications Ordered in ED Medications  sodium chloride 0.9 % bolus 250 mL (0 mLs Intravenous Stopped 12/31/16 1518)  iopamidol (ISOVUE-370) 76 % injection 100 mL (80 mLs Intravenous Contrast Given 12/31/16 1427)     Initial Impression / Assessment and Plan / ED Course  I have reviewed the triage vital signs and the nursing notes.  Pertinent labs & imaging results that were available during my care of the patient were reviewed by me and considered in my medical decision making (see chart for details).  Clinical Course as of Dec 31 1613  Tue Dec 31, 2016  1401 D/w Dr [place] to consult on the ECG.  Looks like a sinus tach.   Unclear at this point though why she is tachycardic.  Will try a small bolus of fluid.   Consider scan for pe  [JK]  1509 Heart rate is now in the 60s again.  [JK]    Clinical Course User Index [JK] Dorie Rank, MD    Tachcyardia resolved when she went to the CT scanner.  Heart rate now in the 60s.  Question if this was a sinus tachycardia.  The abrupt drop suggests possible an svt or a flutter with a 2:1 block.  The symptoms have all resolved.  Ct scan without pe or other acute abnormality.  Stable for dc .  Follow up with her cardiologist to review those findings.  Final Clinical Impressions(s) / ED Diagnoses   Final diagnoses:  Tachycardia       Dorie Rank, MD 12/31/16 1615

## 2017-01-01 ENCOUNTER — Other Ambulatory Visit: Payer: Self-pay | Admitting: Cardiology

## 2017-01-03 ENCOUNTER — Ambulatory Visit (INDEPENDENT_AMBULATORY_CARE_PROVIDER_SITE_OTHER): Payer: Medicare Other | Admitting: Cardiology

## 2017-01-03 ENCOUNTER — Encounter (INDEPENDENT_AMBULATORY_CARE_PROVIDER_SITE_OTHER): Payer: Self-pay

## 2017-01-03 ENCOUNTER — Ambulatory Visit: Payer: Medicare Other | Admitting: Cardiology

## 2017-01-03 ENCOUNTER — Encounter: Payer: Self-pay | Admitting: Cardiology

## 2017-01-03 ENCOUNTER — Ambulatory Visit: Payer: Medicare Other | Admitting: Physician Assistant

## 2017-01-03 VITALS — BP 130/86 | HR 88 | Ht 64.0 in | Wt 226.0 lb

## 2017-01-03 DIAGNOSIS — R0602 Shortness of breath: Secondary | ICD-10-CM

## 2017-01-03 MED ORDER — TICAGRELOR 90 MG PO TABS: 90.0000 mg | ORAL_TABLET | Freq: Two times a day (BID) | ORAL | 11 refills | Status: DC

## 2017-01-03 MED ORDER — ATORVASTATIN CALCIUM 80 MG PO TABS: 80.0000 mg | ORAL_TABLET | Freq: Every day | ORAL | 11 refills | Status: DC

## 2017-01-03 NOTE — Progress Notes (Signed)
01/03/2017 Jacqueline [name]   August 06, 1946  [id number]  Primary Physician Maximino Greenland, MD Primary Cardiologist: Dr. Radford Pax    Reason for Visit/CC: [name] ED f/u for Dyspnea  HPI:  70 y/o AAF with a history of hypertension, hyperlipidemia, obesity, end-stage renal disease on hemodialysis, and diabetes, who was recently admitted with non-ST segment elevation myocardial infarction and underwent stenting of the right coronary artery with a DES 12/2016. LVEF was normal. She was placed on ASA, Brilinta, and high dose Lipitor. She has had hypotension in dialysis with systolic dropping to 60 per her report. In that setting, her labetalol was d/c'd.  She was seen recently for [name] hospital f/u in our clinic on 12/25/16. She was doing well w/o any recurrent CP. No dyspnea. Except for labetalol, she was tolerating all meds ok. She was instructed to return in 6 weeks for f/u lipid panel and 3 months for clinic f/u with Dr. Radford Pax.   On 12/31/16, she was seen in the ED for SOB. Symptoms developed right after her HD session. She was noted to be tachycardic on arrival with rate of 135 bpm. EKG showed a regular rhythm. She had a chest CT that was unremarkable. No PE/ dissection. It was noted that her tachycardia resolved when she went to the CT scanner. Her HR had improved to the 60s. ED MD felt perhaps she had had SVT. Her symptoms resolved after HR improved. She was felt stable for d/c from ED and instructed to f/u in our office for evaluation.   She reports that she has done well since. No recurrent chest pain or dyspnea. Her pulse rate is 88. RRR on exam. She has no complaints.    No outpatient prescriptions have been marked as taking for the 01/03/17 encounter (Office Visit) with Consuelo Pandy, PA-C.   Allergies  Allergen Reactions  . Sulfa Drugs Cross Reactors Other (See Comments)    Doesn't remember    Past Medical History:  Diagnosis Date  . Arthritis    "left knee" (12/04/2016)  . CAD  (coronary artery disease)    a. 12/2016 NSTEMI/PCI: LM nl, LaD 20p/d, D1 20, OM2 20, OM3 20, RCA 58m (3.0x18 Resolute DES). EF 65%.  . Chronic lower back pain   . ESRD on dialysis [facility])    "[facility]; [place]; TTS" (12/04/2016)  . GERD (gastroesophageal reflux disease)   . Hyperlipidemia   . Hypertension    a. 12/2016 labetalol d/c'd 2/2 hypotension in HD.  . Morbid obesity ([place])   . Obesity   . Pneumonia    "couple times" (12/04/2016)  . Renal insufficiency   . Type II diabetes mellitus ([facility])    Family History  Problem Relation Age of Onset  . Diabetes Mother   . Peripheral vascular disease Mother     amputation  . CAD Father   . Heart disease Father   . Hypertension Father   . Heart attack Father   . CAD Sister    Past Surgical History:  Procedure Laterality Date  . ABDOMINAL HYSTERECTOMY    . AV FISTULA PLACEMENT Left 01/27/2014   Procedure: ARTERIOVENOUS (AV) FISTULA CREATION;  Surgeon: Mal Misty, MD;  Location: [facility];  Service: Vascular;  Laterality: Left;  . AV FISTULA REPAIR     "had it cleaned out"  . CARDIAC CATHETERIZATION  ?1980s  . CARDIAC CATHETERIZATION N/A 12/04/2016   Procedure: Left Heart Cath Angiography;  Surgeon: Burnell Blanks, MD;  Location: [place] CV  LAB;  Service: Cardiovascular;  Laterality: N/A;  . CARDIAC CATHETERIZATION N/A 12/04/2016   Procedure: Coronary Stent Intervention;  Surgeon: Burnell Blanks, MD;  Location: [place] CV LAB;  Service: Cardiovascular;  Laterality: N/A;  Mid RCA  . CATARACT EXTRACTION W/ INTRAOCULAR LENS IMPLANT Left   . COLONOSCOPY W/ BIOPSIES AND POLYPECTOMY    . CORONARY ANGIOPLASTY WITH STENT PLACEMENT  12/04/2016  . ESOPHAGOGASTRODUODENOSCOPY    . INSERTION OF DIALYSIS CATHETER N/A 01/19/2014   Procedure: INSERTION OF DIALYSIS CATHETER;  Surgeon: Rosetta Posner, MD;  Location: [facility] OR;  Service: Vascular;  Laterality: N/A;   Social History   Social History  . Marital status: Married     Spouse name: N/A  . Number of children: N/A  . Years of education: N/A   Occupational History  . Not on file.   Social History Main Topics  . Smoking status: Former Smoker    Packs/day: 1.00    Years: 20.00    Types: Cigarettes    Quit date: 18  . Smokeless tobacco: Never Used  . Alcohol use No  . Drug use: No  . Sexual activity: Not Currently   Other Topics Concern  . Not on file   Social History Narrative  . No narrative on file     Review of Systems: General: negative for chills, fever, night sweats or weight changes.  Cardiovascular: negative for chest pain, dyspnea on exertion, edema, orthopnea, palpitations, paroxysmal nocturnal dyspnea or shortness of breath Dermatological: negative for rash Respiratory: negative for cough or wheezing Urologic: negative for hematuria Abdominal: negative for nausea, vomiting, diarrhea, bright red blood per rectum, melena, or hematemesis Neurologic: negative for visual changes, syncope, or dizziness All other systems reviewed and are otherwise negative except as noted above.   Physical Exam:  Blood pressure 130/86, pulse 88, height 5\' 4"  (1.626 m), weight 226 lb (102.5 kg).  General appearance: alert, cooperative and no distress Neck: no carotid bruit and no JVD Lungs: clear to auscultation bilaterally Heart: regular rate and rhythm, S1, S2 normal, no murmur, click, rub or gallop Extremities: extremities normal, atraumatic, no cyanosis or edema Pulses: 2+ and symmetric Skin: Skin color, texture, turgor normal. No rashes or lesions Neurologic: Grossly normal  EKG not performed   ASSESSMENT AND PLAN:   1. CAD: s/p recent NSTEMI 12/2016 with DES to RCA. Normal LVEF. No angina. Continue DAPT with ASA + Brilinta + high dose statin. Intolerant to BBs given hypotension with HD. No ACE/ARB given CKD.    2. Dyspnea: occurred immediately after HD on 12/31/16. There was associated tachycardia with rate in the 130s in ED. Chest CT was  unremarkable. HR spontaneously improved with complete resolution of symptoms. ? If too much fluid was taken off during HD resulting in SVT. She denies any further recurrence of symptoms. Pulse rate is stable in the 80s. No further w/u.   3. HTN: controlled on current regimen. She has had issues with hypotension during HD, resulting in discontinuation of her labetalol   4. HLD: on high dose statin therapy. Lipitor 80 mg. Repeat fasting lipids in 6 weeks.   5. ESRD: on HD.   PLAN  F/u with Dr. Radford Pax in 3 months.   Marveline Profeta PA-C 01/03/2017 10:05 AM

## 2017-01-03 NOTE — Patient Instructions (Signed)
Your physician recommends that you continue on your current medications as directed. Please refer to the Current Medication list given to you today.   Your physician recommends that you schedule a follow-up appointment in: AS PLANNED  

## 2017-01-14 ENCOUNTER — Telehealth: Payer: Self-pay | Admitting: *Deleted

## 2017-01-14 DIAGNOSIS — E785 Hyperlipidemia, unspecified: Secondary | ICD-10-CM

## 2017-01-14 NOTE — Telephone Encounter (Signed)
Patient called and stated that she is unable to tolerate the atorvastatin as it causes abdominal pain especially at night. Patient would like a call back at [phone].

## 2017-01-14 NOTE — Telephone Encounter (Signed)
Left message to call back  

## 2017-01-14 NOTE — Telephone Encounter (Signed)
Patient reports "funny feeling" in her stomach since starting Lipitor. She states her ABD does not ache, it's more of a nausea feeling. She states she has no other symptoms, but the nauseous feeling has not subsided since she started the medication.  She remembers taking Lipitor years ago and stopped the medication due to the same issues. She has tried Pravastatin as well (she was changed from Pravastatin to Lipitor her last hospital stay). She requests medication recommendations.  To [facility] Clinic.

## 2017-01-14 NOTE — Telephone Encounter (Signed)
Do not see that pt has tried Crestor, she may tolerate this better. Equivalent dose would be Crestor 40mg  daily. Would advise pt to take with food and in the evening to prevent any nausea.

## 2017-01-15 MED ORDER — ROSUVASTATIN CALCIUM 40 MG PO TABS: 40.0000 mg | ORAL_TABLET | Freq: Every day | ORAL | 3 refills | Status: DC

## 2017-01-15 NOTE — Telephone Encounter (Signed)
Follow up     Pt is returning Katy's call.

## 2017-01-15 NOTE — Telephone Encounter (Signed)
Instructed patient to STOP LIPITOR and START CRESTOR 40 mg daily. She understands to take with dinner to prevent nausea. FLP and ALT rescheduled to 3/30. Patient agrees with treatment plan.

## 2017-01-31 ENCOUNTER — Telehealth (HOSPITAL_COMMUNITY): Payer: Self-pay | Admitting: Internal Medicine

## 2017-01-31 NOTE — Telephone Encounter (Signed)
Verified [employer] Medicare insurance benefits through [employer]. $20.00 Co-Pay, No Co-Insurance,  Out of Pocket $4000.00  Pt has met $479.89, Pt's responsibility is $3520.11 Reference # [id number]... KJ

## 2017-02-12 ENCOUNTER — Ambulatory Visit (INDEPENDENT_AMBULATORY_CARE_PROVIDER_SITE_OTHER): Payer: Medicare Other | Admitting: Podiatry

## 2017-02-12 ENCOUNTER — Encounter: Payer: Self-pay | Admitting: Podiatry

## 2017-02-12 ENCOUNTER — Other Ambulatory Visit: Payer: Medicare Other

## 2017-02-12 VITALS — BP 173/72 | HR 62

## 2017-02-12 DIAGNOSIS — M79671 Pain in right foot: Secondary | ICD-10-CM

## 2017-02-12 DIAGNOSIS — B351 Tinea unguium: Secondary | ICD-10-CM

## 2017-02-12 DIAGNOSIS — M79672 Pain in left foot: Secondary | ICD-10-CM

## 2017-02-12 NOTE — Patient Instructions (Signed)
Seen for hypertrophic nails and calluses. All nails and callus under left big toe debrided. Return in 3 months or as needed.

## 2017-02-12 NOTE — Progress Notes (Signed)
Subjective: 71 y.o. year old female patient presents accompanied by her husband requesting toe nails trimmed.  Patient is on Dialysis since February 2015.  Blood sugar is under control.   Objective: Dermatologic: Thick protruding plantar tyloma under left great toe. Thick yellow deformed nails x 10.  Dark discolorated and dry skin foot and leg bilateral. Vascular:Pedal pulses are not palpable.Hyperpigmented forefoot and rear foot.  Orthopedic:Contracted lesser digits bilateral.  Neurologic:All epicritic and tactile sensations grossly intact.   Assessment: Dystrophic mycotic nails x 10.  Plantar tyloma left great toe. Painful feet from thick nails.   Treatment: All mycotic nails debrided.  Debrided left great toe lesion. Return in 3 month.

## 2017-02-14 ENCOUNTER — Other Ambulatory Visit: Payer: Medicare Other

## 2017-02-17 ENCOUNTER — Telehealth (HOSPITAL_COMMUNITY): Payer: Self-pay | Admitting: *Deleted

## 2017-02-17 NOTE — Telephone Encounter (Signed)
Called to inquire pt readiness to participate in cardiac rehab. Contact information provided. Cherre Huger, BSN Cardiac and Training and development officer

## 2017-02-28 ENCOUNTER — Other Ambulatory Visit: Payer: Medicare Other | Admitting: *Deleted

## 2017-02-28 DIAGNOSIS — E785 Hyperlipidemia, unspecified: Secondary | ICD-10-CM

## 2017-02-28 LAB — LIPID PANEL
CHOL/HDL RATIO: 2.7 ratio (ref 0.0–4.4)
Cholesterol, Total: 147 mg/dL (ref 100–199)
HDL: 54 mg/dL (ref >39)
LDL CALC: 81 mg/dL (ref 0–99)
Triglycerides: 61 mg/dL (ref 0–149)
VLDL Cholesterol Cal: 12 mg/dL (ref 5–40)

## 2017-02-28 LAB — ALT: ALT: 11 IU/L (ref 0–32)

## 2017-03-03 ENCOUNTER — Other Ambulatory Visit: Payer: Self-pay | Admitting: *Deleted

## 2017-03-03 DIAGNOSIS — E785 Hyperlipidemia, unspecified: Secondary | ICD-10-CM

## 2017-03-03 MED ORDER — EZETIMIBE 10 MG PO TABS: 10.0000 mg | ORAL_TABLET | Freq: Every day | ORAL | 9 refills | Status: DC

## 2017-03-12 ENCOUNTER — Encounter (HOSPITAL_COMMUNITY): Payer: Self-pay | Admitting: Internal Medicine

## 2017-03-12 NOTE — Progress Notes (Signed)
Mailed letter with our CRP to pt... KJ °

## 2017-03-25 ENCOUNTER — Telehealth (HOSPITAL_COMMUNITY): Payer: Self-pay | Admitting: Internal Medicine

## 2017-04-14 ENCOUNTER — Other Ambulatory Visit: Payer: Medicare Other | Admitting: *Deleted

## 2017-04-14 DIAGNOSIS — E785 Hyperlipidemia, unspecified: Secondary | ICD-10-CM

## 2017-04-14 LAB — LIPID PANEL
Chol/HDL Ratio: 2.3 ratio (ref 0.0–4.4)
Cholesterol, Total: 133 mg/dL (ref 100–199)
HDL: 59 mg/dL (ref >39)
LDL CALC: 61 mg/dL (ref 0–99)
Triglycerides: 66 mg/dL (ref 0–149)
VLDL CHOLESTEROL CAL: 13 mg/dL (ref 5–40)

## 2017-04-14 LAB — ALT: ALT: 16 IU/L (ref 0–32)

## 2017-05-14 ENCOUNTER — Ambulatory Visit: Payer: Medicare Other | Admitting: Podiatry

## 2017-07-10 ENCOUNTER — Encounter (HOSPITAL_COMMUNITY): Payer: Self-pay

## 2017-07-10 ENCOUNTER — Emergency Department (HOSPITAL_COMMUNITY)
Admission: EM | Admit: 2017-07-10 | Discharge: 2017-07-10 | Disposition: A | Discharge status: Home / Self Care | Payer: Medicare Other | Attending: Emergency Medicine | Admitting: Emergency Medicine

## 2017-07-10 DIAGNOSIS — Z87891 Personal history of nicotine dependence: Secondary | ICD-10-CM | POA: Insufficient documentation

## 2017-07-10 DIAGNOSIS — Z79899 Other long term (current) drug therapy: Secondary | ICD-10-CM | POA: Insufficient documentation

## 2017-07-10 DIAGNOSIS — E119 Type 2 diabetes mellitus without complications: Secondary | ICD-10-CM | POA: Insufficient documentation

## 2017-07-10 DIAGNOSIS — I120 Hypertensive chronic kidney disease with stage 5 chronic kidney disease or end stage renal disease: Secondary | ICD-10-CM | POA: Diagnosis not present

## 2017-07-10 DIAGNOSIS — N186 End stage renal disease: Secondary | ICD-10-CM | POA: Diagnosis not present

## 2017-07-10 DIAGNOSIS — E785 Hyperlipidemia, unspecified: Secondary | ICD-10-CM | POA: Diagnosis not present

## 2017-07-10 DIAGNOSIS — I2510 Atherosclerotic heart disease of native coronary artery without angina pectoris: Secondary | ICD-10-CM | POA: Insufficient documentation

## 2017-07-10 DIAGNOSIS — Z992 Dependence on renal dialysis: Secondary | ICD-10-CM | POA: Diagnosis not present

## 2017-07-10 DIAGNOSIS — I110 Hypertensive heart disease with heart failure: Secondary | ICD-10-CM | POA: Insufficient documentation

## 2017-07-10 DIAGNOSIS — R531 Weakness: Secondary | ICD-10-CM

## 2017-07-10 DIAGNOSIS — I252 Old myocardial infarction: Secondary | ICD-10-CM | POA: Diagnosis not present

## 2017-07-10 DIAGNOSIS — I5032 Chronic diastolic (congestive) heart failure: Secondary | ICD-10-CM | POA: Diagnosis not present

## 2017-07-10 MED ORDER — ONDANSETRON 4 MG PO TBDP: ORAL_TABLET | ORAL | 0 refills | Status: DC

## 2017-07-10 NOTE — Discharge Instructions (Signed)
Follow up with your md if any problems °

## 2017-07-10 NOTE — ED Triage Notes (Signed)
Per [facility]: Pt had dialysis from 6 am to 9:50, full treatment. Pt was at gas station across from dialysis center. Pt stated center had difficulty accessing graft. Had some dizziness and nausea at gas station. Pt has vomited 2 times. Pt was no longer complaining of dizziness with [facility], but does endorse weakness. Pt was clammy upon EMS arrival. 12 lead unremarkable. Pt received 4 mg zofran with [facility]. BP was initially 120 palp and spiked to 230/197.

## 2017-07-10 NOTE — ED Provider Notes (Signed)
[name] DEPT Provider Note   CSN: [id number] Arrival date & time: 07/10/17  1156     History   Chief Complaint Chief Complaint  Patient presents with  . Fatigue    HPI Jacqueline [name] is a 71 y.o. female.      Patient states that she had dialysis today and was at a gas station and started to get weak and dizzy and nauseated   The history is provided by the patient. No language interpreter was used.  Weakness  Primary symptoms include no focal weakness. This is a new problem. The current episode started less than 1 hour ago. The problem has been resolved. There was no focality noted. There has been no fever. Pertinent negatives include no shortness of breath, no chest pain and no headaches. There were no medications administered prior to arrival. Associated medical issues do not include trauma.    Past Medical History:  Diagnosis Date  . Arthritis    "left knee" (12/04/2016)  . CAD (coronary artery disease)    a. 12/2016 NSTEMI/PCI: LM nl, LaD 20p/d, D1 20, OM2 20, OM3 20, RCA 93m (3.0x18 Resolute DES). EF 65%.  . Chronic lower back pain   . ESRD on dialysis [facility])    "[facility]; [place]; TTS" (12/04/2016)  . GERD (gastroesophageal reflux disease)   . Hyperlipidemia   . Hypertension    a. 12/2016 labetalol d/c'd 2/2 hypotension in HD.  . Morbid obesity ([place])   . Obesity   . Pneumonia    "couple times" (12/04/2016)  . Renal insufficiency   . Type II diabetes mellitus [facility])     Patient Active Problem List   Diagnosis Date Noted  . NSTEMI (non-ST elevated myocardial infarction) ([place])   . Chest pain 12/03/2016  . Pain in lower limb 08/26/2014  . End stage renal disease ([place]) 03/15/2014  . HCAP (healthcare-associated pneumonia) 02/13/2014  . 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 OSA (obstructive sleep apnea) 01/21/2014  . 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[place]) 01/21/2014  . Acute respiratory failure ([place]) 01/19/2014  . Acute  encephalopathy 01/19/2014  . Congestive heart failure ([place]) 01/15/2014  . ESRD (end stage renal disease) on dialysis ([place]) 01/15/2014  . Hypertension 01/15/2014  . Diabetes mellitus ([place]) 01/15/2014  . Anemia 01/15/2014  . Acute diastolic CHF (congestive heart failure) ([place]) 01/15/2014  . Onychomycosis 03/26/2013  . Pain in joint, ankle and foot 03/26/2013    Past Surgical History:  Procedure Laterality Date  . ABDOMINAL HYSTERECTOMY    . AV FISTULA PLACEMENT Left 01/27/2014   Procedure: ARTERIOVENOUS (AV) FISTULA CREATION;  Surgeon: Mal Misty, MD;  Location: [place];  Service: Vascular;  Laterality: Left;  . AV FISTULA REPAIR     "had it cleaned out"  . CARDIAC CATHETERIZATION  ?1980s  . CARDIAC CATHETERIZATION N/A 12/04/2016   Procedure: Left Heart Cath Angiography;  Surgeon: Burnell Blanks, MD;  Location: [place] CV LAB;  Service: Cardiovascular;  Laterality: N/A;  . CARDIAC CATHETERIZATION N/A 12/04/2016   Procedure: Coronary Stent Intervention;  Surgeon: Burnell Blanks, MD;  Location: [place] CV LAB;  Service: Cardiovascular;  Laterality: N/A;  Mid RCA  . CATARACT EXTRACTION W/ INTRAOCULAR LENS IMPLANT Left   . COLONOSCOPY W/ BIOPSIES AND POLYPECTOMY    . CORONARY ANGIOPLASTY WITH STENT PLACEMENT  12/04/2016  . ESOPHAGOGASTRODUODENOSCOPY    . INSERTION OF DIALYSIS CATHETER N/A 01/19/2014   Procedure: INSERTION OF DIALYSIS CATHETER;  Surgeon: Rosetta Posner, MD;  Location: [facility]  OR;  Service: Vascular;  Laterality: N/A;    OB History    No data available       Home Medications    Prior to Admission medications   Medication Sig Start Date End Date Taking? Authorizing Provider  aspirin EC 81 MG EC tablet Take 1 tablet (81 mg total) by mouth daily. 12/06/16   Geradine Girt, DO  b complex vitamins capsule Take 1 capsule by mouth daily.    [provider]  calcium acetate (PHOSLO) 667 MG capsule Take 667 mg by mouth See admin instructions. Take 667  mg by mouth 3 times daily with meals and take 667 mg by mouth with snacks. 10/21/16   [provider]  docusate sodium (COLACE) 100 MG capsule Take 100 mg by mouth 2 (two) times daily.    [provider]  ezetimibe (ZETIA) 10 MG tablet Take 1 tablet (10 mg total) by mouth daily. 03/03/17 06/01/17  Sueanne Margarita, MD  fish oil-omega-3 fatty acids 1000 MG capsule Take 1 g by mouth daily.      [provider]  folic acid-vitamin b complex-vitamin c-selenium-zinc (DIALYVITE) 3 MG TABS tablet Take 1 tablet by mouth daily.    [provider]  meclizine (ANTIVERT) 25 MG tablet Take 25 mg by mouth 3 (three) times daily as needed for dizziness.    [provider]  metoCLOPramide (REGLAN) 10 MG tablet Take 10 mg by mouth 2 (two) times daily.    [provider]  nitroGLYCERIN (NITROSTAT) 0.4 MG SL tablet Place 1 tablet (0.4 mg total) under the tongue every 5 (five) minutes as needed for chest pain. 12/09/16 03/09/17  Sueanne Margarita, MD  omeprazole (PRILOSEC) 20 MG capsule Take 20 mg by mouth daily. 10/23/16   [provider]  ondansetron (ZOFRAN ODT) 4 MG disintegrating tablet 4mg  ODT q4 hours prn nausea/vomit 07/10/17   Milton Ferguson, MD  rosuvastatin (CRESTOR) 40 MG tablet Take 1 tablet (40 mg total) by mouth daily. 01/15/17 01/10/18  Sueanne Margarita, MD  ticagrelor (BRILINTA) 90 MG TABS tablet Take 1 tablet (90 mg total) by mouth 2 (two) times daily. 01/03/17   Charlie Pitter, PA-C    Family History Family History  Problem Relation Age of Onset  . Diabetes Mother   . Peripheral vascular disease Mother        amputation  . CAD Father   . Heart disease Father   . Hypertension Father   . Heart attack Father   . CAD Sister     Social History Social History  Substance Use Topics  . Smoking status: Former Smoker    Packs/day: 1.00    Years: 20.00    Types: Cigarettes    Quit date: 30  . Smokeless tobacco: Never Used  . Alcohol use No      Allergies   Sulfa drugs cross reactors   Review of Systems Review of Systems  Constitutional: Negative for appetite change and fatigue.  HENT: Negative for congestion, ear discharge and sinus pressure.   Eyes: Negative for discharge.  Respiratory: Negative for cough and shortness of breath.   Cardiovascular: Negative for chest pain.  Gastrointestinal: Positive for nausea. Negative for abdominal pain and diarrhea.  Genitourinary: Negative for frequency and hematuria.  Musculoskeletal: Negative for back pain.  Skin: Negative for rash.  Neurological: Positive for weakness. Negative for focal weakness, seizures and headaches.  Psychiatric/Behavioral: Negative for hallucinations.     Physical Exam Updated Vital Signs  BP (!) 144/58   Pulse 63   Temp 98 F (36.7 C) (Oral)   Resp 17   Wt 104 kg (229 lb 4.5 oz)   SpO2 94%   BMI 39.36 kg/m   Physical Exam  Constitutional: She is oriented to person, place, and time. She appears well-developed.  HENT:  Head: Normocephalic.  Eyes: Conjunctivae and EOM are normal. No scleral icterus.  Neck: Neck supple. No thyromegaly present.  Cardiovascular: Normal rate and regular rhythm.  Exam reveals no gallop and no friction rub.   No murmur heard. Pulmonary/Chest: No stridor. She has no wheezes. She has no rales. She exhibits no tenderness.  Abdominal: She exhibits no distension. There is no tenderness. There is no rebound.  Musculoskeletal: Normal range of motion. She exhibits no edema.  Lymphadenopathy:    She has no cervical adenopathy.  Neurological: She is oriented to person, place, and time. She exhibits normal muscle tone. Coordination normal.  Skin: No rash noted. No erythema.  Psychiatric: She has a normal mood and affect. Her behavior is normal.     ED Treatments / Results  Labs (all labs ordered are listed, but only abnormal results are displayed) Labs Reviewed - No data to display  EKG  EKG Interpretation None        Radiology No results found.  Procedures Procedures (including critical care time)  Medications Ordered in ED Medications - No data to display   Initial Impression / Assessment and Plan / ED Course  I have reviewed the triage vital signs and the nursing notes.  Pertinent labs & imaging results that were available during my care of the patient were reviewed by me and considered in my medical decision making (see chart for details).     Patient had no nausea or weakness for over an hour in the emergency department. She will be sent home with some Zofran and will follow-up if anymore problems. Suspect symptoms related to her dialysis today  Final Clinical Impressions(s) / ED Diagnoses   Final diagnoses:  Weakness    New Prescriptions New Prescriptions   ONDANSETRON (ZOFRAN ODT) 4 MG DISINTEGRATING TABLET    4mg  ODT q4 hours prn nausea/vomit     Milton Ferguson, MD 07/10/17 1303

## 2017-07-16 ENCOUNTER — Emergency Department (HOSPITAL_COMMUNITY)
Admission: EM | Admit: 2017-07-16 | Discharge: 2017-07-17 | Disposition: A | Discharge status: Home / Self Care | Payer: Medicare Other | Attending: Emergency Medicine | Admitting: Emergency Medicine

## 2017-07-16 ENCOUNTER — Encounter (HOSPITAL_COMMUNITY): Payer: Self-pay | Admitting: Emergency Medicine

## 2017-07-16 ENCOUNTER — Other Ambulatory Visit: Payer: Self-pay | Admitting: Nurse Practitioner

## 2017-07-16 DIAGNOSIS — Z87891 Personal history of nicotine dependence: Secondary | ICD-10-CM | POA: Insufficient documentation

## 2017-07-16 DIAGNOSIS — E1122 Type 2 diabetes mellitus with diabetic chronic kidney disease: Secondary | ICD-10-CM | POA: Diagnosis not present

## 2017-07-16 DIAGNOSIS — Z7982 Long term (current) use of aspirin: Secondary | ICD-10-CM | POA: Diagnosis not present

## 2017-07-16 DIAGNOSIS — Z79899 Other long term (current) drug therapy: Secondary | ICD-10-CM | POA: Diagnosis not present

## 2017-07-16 DIAGNOSIS — N186 End stage renal disease: Secondary | ICD-10-CM | POA: Insufficient documentation

## 2017-07-16 DIAGNOSIS — I2510 Atherosclerotic heart disease of native coronary artery without angina pectoris: Secondary | ICD-10-CM | POA: Insufficient documentation

## 2017-07-16 DIAGNOSIS — Z992 Dependence on renal dialysis: Secondary | ICD-10-CM | POA: Insufficient documentation

## 2017-07-16 DIAGNOSIS — L929 Granulomatous disorder of the skin and subcutaneous tissue, unspecified: Secondary | ICD-10-CM

## 2017-07-16 DIAGNOSIS — Z1231 Encounter for screening mammogram for malignant neoplasm of breast: Secondary | ICD-10-CM

## 2017-07-16 DIAGNOSIS — I120 Hypertensive chronic kidney disease with stage 5 chronic kidney disease or end stage renal disease: Secondary | ICD-10-CM | POA: Diagnosis not present

## 2017-07-16 DIAGNOSIS — R2242 Localized swelling, mass and lump, left lower limb: Secondary | ICD-10-CM | POA: Diagnosis present

## 2017-07-16 LAB — CBG MONITORING, ED: Glucose-Capillary: 139 mg/dL — ABNORMAL HIGH (ref 65–99)

## 2017-07-16 NOTE — ED Triage Notes (Signed)
Pt [place] from church where she struck her R foot that caused her to bleed and she reports she lost a lot of blood and clots were lost; pt reports hx of DM and neuropathy and HD (T TH Sat)

## 2017-07-16 NOTE — ED Notes (Signed)
Pt states "I feel like my sugar is low" Checked and noted to be 139.

## 2017-07-17 ENCOUNTER — Ambulatory Visit (INDEPENDENT_AMBULATORY_CARE_PROVIDER_SITE_OTHER): Payer: Medicare Other | Admitting: Podiatry

## 2017-07-17 DIAGNOSIS — B351 Tinea unguium: Secondary | ICD-10-CM | POA: Diagnosis not present

## 2017-07-17 DIAGNOSIS — M79671 Pain in right foot: Secondary | ICD-10-CM | POA: Diagnosis not present

## 2017-07-17 DIAGNOSIS — M79672 Pain in left foot: Secondary | ICD-10-CM | POA: Diagnosis not present

## 2017-07-17 MED ORDER — SILVER NITRATE-POT NITRATE 75-25 % EX MISC
1.0000 "application " | Freq: Once | CUTANEOUS | Status: AC
  Administered 2017-07-17: 1 via TOPICAL
  Filled 2017-07-17: qty 1

## 2017-07-17 NOTE — ED Provider Notes (Signed)
[facility] DEPT Provider Note   CSN: [id number] Arrival date & time: 07/16/17  2057     History   Chief Complaint Chief Complaint  Patient presents with  . Foot Injury    HPI Jacqueline [name] is a 71 y.o. female.  Patient was seen by her primary care doctor today and was noted to have a growth on the side of her foot she's been referred to podiatry for further evaluation.  Tonight while she was at church the area started to bleed.      Past Medical History:  Diagnosis Date  . Arthritis    "left knee" (12/04/2016)  . CAD (coronary artery disease)    a. 12/2016 NSTEMI/PCI: LM nl, LaD 20p/d, D1 20, OM2 20, OM3 20, RCA 55m (3.0x18 Resolute DES). EF 65%.  . Chronic lower back pain   . ESRD on dialysis [facility])    "[facility]; [place]; TTS" (12/04/2016)  . GERD (gastroesophageal reflux disease)   . Hyperlipidemia   . Hypertension    a. 12/2016 labetalol d/c'd 2/2 hypotension in HD.  . Morbid obesity ([place])   . Obesity   . Pneumonia    "couple times" (12/04/2016)  . Renal insufficiency   . Type II diabetes mellitus [facility])     Patient Active Problem List   Diagnosis Date Noted  . NSTEMI (non-ST elevated myocardial infarction) ([place])   . Chest pain 12/03/2016  . Pain in lower limb 08/26/2014  . End stage renal disease ([place]) 03/15/2014  . HCAP (healthcare-associated pneumonia) 02/13/2014  . 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 OSA (obstructive sleep apnea) 01/21/2014  . 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[place]) 01/21/2014  . Acute respiratory failure ([place]) 01/19/2014  . Acute encephalopathy 01/19/2014  . Congestive heart failure ([place]) 01/15/2014  . ESRD (end stage renal disease) on dialysis ([place]) 01/15/2014  . Hypertension 01/15/2014  . Diabetes mellitus ([place]) 01/15/2014  . Anemia 01/15/2014  . Acute diastolic CHF (congestive heart failure) ([place]) 01/15/2014  . Onychomycosis 03/26/2013  . Pain in joint, ankle and foot 03/26/2013    Past  Surgical History:  Procedure Laterality Date  . ABDOMINAL HYSTERECTOMY    . AV FISTULA PLACEMENT Left 01/27/2014   Procedure: ARTERIOVENOUS (AV) FISTULA CREATION;  Surgeon: Mal Misty, MD;  Location: [place];  Service: Vascular;  Laterality: Left;  . AV FISTULA REPAIR     "had it cleaned out"  . CARDIAC CATHETERIZATION  ?1980s  . CARDIAC CATHETERIZATION N/A 12/04/2016   Procedure: Left Heart Cath Angiography;  Surgeon: Burnell Blanks, MD;  Location: [place] CV LAB;  Service: Cardiovascular;  Laterality: N/A;  . CARDIAC CATHETERIZATION N/A 12/04/2016   Procedure: Coronary Stent Intervention;  Surgeon: Burnell Blanks, MD;  Location: [place] CV LAB;  Service: Cardiovascular;  Laterality: N/A;  Mid RCA  . CATARACT EXTRACTION W/ INTRAOCULAR LENS IMPLANT Left   . COLONOSCOPY W/ BIOPSIES AND POLYPECTOMY    . CORONARY ANGIOPLASTY WITH STENT PLACEMENT  12/04/2016  . ESOPHAGOGASTRODUODENOSCOPY    . INSERTION OF DIALYSIS CATHETER N/A 01/19/2014   Procedure: INSERTION OF DIALYSIS CATHETER;  Surgeon: Rosetta Posner, MD;  Location: [facility] OR;  Service: Vascular;  Laterality: N/A;    OB History    No data available       Home Medications    Prior to Admission medications   Medication Sig Start Date End Date Taking? Authorizing Provider  aspirin EC 81 MG EC tablet Take 1 tablet (81 mg total) by mouth daily. 12/06/16  Eulogio Bear U, DO  b complex vitamins capsule Take 1 capsule by mouth daily.    [provider]  calcium acetate (PHOSLO) 667 MG capsule Take 667 mg by mouth See admin instructions. Take 667 mg by mouth 3 times daily with meals and take 667 mg by mouth with snacks. 10/21/16   [provider]  docusate sodium (COLACE) 100 MG capsule Take 100 mg by mouth 2 (two) times daily.    [provider]  ezetimibe (ZETIA) 10 MG tablet Take 1 tablet (10 mg total) by mouth daily. 03/03/17 06/01/17  Sueanne Margarita, MD  fish oil-omega-3 fatty acids 1000 MG  capsule Take 1 g by mouth daily.      [provider]  folic acid-vitamin b complex-vitamin c-selenium-zinc (DIALYVITE) 3 MG TABS tablet Take 1 tablet by mouth daily.    [provider]  meclizine (ANTIVERT) 25 MG tablet Take 25 mg by mouth 3 (three) times daily as needed for dizziness.    [provider]  metoCLOPramide (REGLAN) 10 MG tablet Take 10 mg by mouth 2 (two) times daily.    [provider]  nitroGLYCERIN (NITROSTAT) 0.4 MG SL tablet Place 1 tablet (0.4 mg total) under the tongue every 5 (five) minutes as needed for chest pain. 12/09/16 03/09/17  Sueanne Margarita, MD  omeprazole (PRILOSEC) 20 MG capsule Take 20 mg by mouth daily. 10/23/16   [provider]  ondansetron (ZOFRAN ODT) 4 MG disintegrating tablet 4mg  ODT q4 hours prn nausea/vomit 07/10/17   Milton Ferguson, MD  rosuvastatin (CRESTOR) 40 MG tablet Take 1 tablet (40 mg total) by mouth daily. 01/15/17 01/10/18  Sueanne Margarita, MD  ticagrelor (BRILINTA) 90 MG TABS tablet Take 1 tablet (90 mg total) by mouth 2 (two) times daily. 01/03/17   Charlie Pitter, PA-C    Family History Family History  Problem Relation Age of Onset  . Diabetes Mother   . Peripheral vascular disease Mother        amputation  . CAD Father   . Heart disease Father   . Hypertension Father   . Heart attack Father   . CAD Sister     Social History Social History  Substance Use Topics  . Smoking status: Former Smoker    Packs/day: 1.00    Years: 20.00    Types: Cigarettes    Quit date: 5  . Smokeless tobacco: Never Used  . Alcohol use No     Allergies   Sulfa drugs cross reactors   Review of Systems Review of Systems  Constitutional: Negative for fever.  Skin: Positive for wound.  All other systems reviewed and are negative.    Physical Exam Updated Vital Signs BP 124/61   Temp 98.8 F (37.1 C) (Oral)   Resp 18   Ht 5\' 5"  (1.651 m)   Wt 105 kg (231 lb 7.7 oz)   SpO2 100%   BMI 38.52 kg/m     Physical Exam  Constitutional: She appears well-developed and well-nourished.  HENT:  Head: Normocephalic.  Eyes: Pupils are equal, round, and reactive to light.  Neck: Normal range of motion.  Cardiovascular: Normal rate.   Pulmonary/Chest: Effort normal.  Musculoskeletal: She exhibits tenderness. She exhibits no edema or deformity.  Skin: Skin is warm and dry.     Nursing note and vitals reviewed.    ED Treatments / Results  Labs (all labs ordered are listed, but only abnormal results are displayed) Labs Reviewed  CBG  MONITORING, ED - Abnormal; Notable for the following:       Result Value   Glucose-Capillary 139 (*)    All other components within normal limits    EKG  EKG Interpretation None       Radiology No results found.  Procedures Procedures (including critical care time)  Medications Ordered in ED Medications  silver nitrate applicators applicator 1 application (not administered)     Initial Impression / Assessment and Plan / ED Course  I have reviewed the triage vital signs and the nursing notes.  Pertinent labs & imaging results that were available during my care of the patient were reviewed by me and considered in my medical decision making (see chart for details).      No active bleeding at this time.  We will apply silver nitrate and wrapped with a dressing and have patient follow-up with podiatry as previously planned by her PCP  Final Clinical Impressions(s) / ED Diagnoses   Final diagnoses:  Granuloma of skin    New Prescriptions New Prescriptions   No medications on file     Junius Creamer, NP 07/17/17 5170    Ripley Fraise, MD 07/17/17 [phone]

## 2017-07-17 NOTE — Patient Instructions (Addendum)
Seen for a lesion that had bleeding episode. The lesion is dry under compression bandage. Noted of hypertrophic nails. All nails debrided. Advised to set up an appointment with Dermatologist for cancer screen. Return in 3 months or as needed.

## 2017-07-17 NOTE — ED Notes (Signed)
ED Provider at bedside. 

## 2017-07-17 NOTE — ED Notes (Signed)
Pt verbalized understanding of d.c instructions, no further questions. Pt unable to sign due to signature pad not working

## 2017-07-17 NOTE — Discharge Instructions (Signed)
Give a dressing on the area until you're seen by the podiatrist if it bleeds again.  Please place pressured immediately over the area for 5-10 minutes.  If it still bleeding.  Then returned to the emergency department or your PCP for further evaluation

## 2017-07-17 NOTE — Progress Notes (Signed)
Subjective: 71y.o. year old female patient presents accompanied by her husband requestingtoe nails trimmed.  Patient is on Dialysis since February 2015.  Blood sugar is under control.   Objective: Dermatologic: Thick protruding plantar tyloma under left great toe. Thick yellow deformed nails x 10.  Dark discolorated and dry skin foot and leg bilateral. Vascular:Pedal pulses are not palpable.Hyperpigmented forefoot and rear foot.  Orthopedic:Contracted lesser digits bilateral.  Neurologic:All epicritic and tactile sensations grossly intact.   Assessment: Dystrophic mycotic nails x 10.  Plantar tyloma left great toe. Painful feet from thick nails.   Treatment: All mycotic nails debrided.  Debrided left great toe lesion. Return in 3 month.

## 2017-07-18 ENCOUNTER — Other Ambulatory Visit: Payer: Self-pay | Admitting: Internal Medicine

## 2017-07-18 DIAGNOSIS — E2839 Other primary ovarian failure: Secondary | ICD-10-CM

## 2017-07-23 ENCOUNTER — Encounter (HOSPITAL_COMMUNITY): Payer: Self-pay | Admitting: Emergency Medicine

## 2017-07-23 ENCOUNTER — Emergency Department (HOSPITAL_COMMUNITY): Payer: Medicare Other

## 2017-07-23 ENCOUNTER — Emergency Department (HOSPITAL_COMMUNITY)
Admission: EM | Admit: 2017-07-23 | Discharge: 2017-07-23 | Disposition: A | Discharge status: Home / Self Care | Payer: Medicare Other | Attending: Physician Assistant | Admitting: Physician Assistant

## 2017-07-23 DIAGNOSIS — Z992 Dependence on renal dialysis: Secondary | ICD-10-CM | POA: Insufficient documentation

## 2017-07-23 DIAGNOSIS — E119 Type 2 diabetes mellitus without complications: Secondary | ICD-10-CM | POA: Diagnosis not present

## 2017-07-23 DIAGNOSIS — I120 Hypertensive chronic kidney disease with stage 5 chronic kidney disease or end stage renal disease: Secondary | ICD-10-CM | POA: Diagnosis not present

## 2017-07-23 DIAGNOSIS — I2510 Atherosclerotic heart disease of native coronary artery without angina pectoris: Secondary | ICD-10-CM | POA: Diagnosis not present

## 2017-07-23 DIAGNOSIS — K5901 Slow transit constipation: Secondary | ICD-10-CM | POA: Insufficient documentation

## 2017-07-23 DIAGNOSIS — Z8673 Personal history of transient ischemic attack (TIA), and cerebral infarction without residual deficits: Secondary | ICD-10-CM | POA: Insufficient documentation

## 2017-07-23 DIAGNOSIS — N186 End stage renal disease: Secondary | ICD-10-CM | POA: Diagnosis not present

## 2017-07-23 DIAGNOSIS — Z7982 Long term (current) use of aspirin: Secondary | ICD-10-CM | POA: Insufficient documentation

## 2017-07-23 DIAGNOSIS — Z87891 Personal history of nicotine dependence: Secondary | ICD-10-CM | POA: Diagnosis not present

## 2017-07-23 DIAGNOSIS — R1031 Right lower quadrant pain: Secondary | ICD-10-CM | POA: Diagnosis present

## 2017-07-23 DIAGNOSIS — E785 Hyperlipidemia, unspecified: Secondary | ICD-10-CM | POA: Insufficient documentation

## 2017-07-23 LAB — COMPREHENSIVE METABOLIC PANEL
ALT: 18 U/L (ref 14–54)
AST: 23 U/L (ref 15–41)
Albumin: 3.4 g/dL — ABNORMAL LOW (ref 3.5–5.0)
Alkaline Phosphatase: 49 U/L (ref 38–126)
Anion gap: 12 (ref 5–15)
BUN: 32 mg/dL — ABNORMAL HIGH (ref 6–20)
CALCIUM: 9.4 mg/dL (ref 8.9–10.3)
CHLORIDE: 99 mmol/L — AB (ref 101–111)
CO2: 29 mmol/L (ref 22–32)
CREATININE: 8.83 mg/dL — AB (ref 0.44–1.00)
GFR, EST AFRICAN AMERICAN: 5 mL/min — AB (ref >60)
GFR, EST NON AFRICAN AMERICAN: 4 mL/min — AB (ref >60)
Glucose, Bld: 108 mg/dL — ABNORMAL HIGH (ref 65–99)
Potassium: 3.3 mmol/L — ABNORMAL LOW (ref 3.5–5.1)
Sodium: 140 mmol/L (ref 135–145)
Total Bilirubin: 0.8 mg/dL (ref 0.3–1.2)
Total Protein: 6.4 g/dL — ABNORMAL LOW (ref 6.5–8.1)

## 2017-07-23 LAB — CBC WITH DIFFERENTIAL/PLATELET
Basophils Absolute: 0 10*3/uL (ref 0.0–0.1)
Basophils Relative: 0 %
EOS PCT: 0 %
Eosinophils Absolute: 0 10*3/uL (ref 0.0–0.7)
HEMATOCRIT: 29.9 % — AB (ref 36.0–46.0)
HEMOGLOBIN: 9.4 g/dL — AB (ref 12.0–15.0)
LYMPHS PCT: 13 %
Lymphs Abs: 1.4 10*3/uL (ref 0.7–4.0)
MCH: 29.6 pg (ref 26.0–34.0)
MCHC: 31.4 g/dL (ref 30.0–36.0)
MCV: 94 fL (ref 78.0–100.0)
Monocytes Absolute: 0.8 10*3/uL (ref 0.1–1.0)
Monocytes Relative: 7 %
NEUTROS PCT: 80 %
Neutro Abs: 9.2 10*3/uL — ABNORMAL HIGH (ref 1.7–7.7)
PLATELETS: 183 10*3/uL (ref 150–400)
RBC: 3.18 MIL/uL — AB (ref 3.87–5.11)
RDW: 15.2 % (ref 11.5–15.5)
WBC: 11.5 10*3/uL — ABNORMAL HIGH (ref 4.0–10.5)

## 2017-07-23 MED ORDER — POLYETHYLENE GLYCOL 3350 17 G PO PACK: 17.0000 g | PACK | Freq: Every day | ORAL | 0 refills | Status: DC

## 2017-07-23 MED ORDER — IOPAMIDOL (ISOVUE-300) INJECTION 61%
INTRAVENOUS | Status: AC
  Administered 2017-07-23: 100 mL via INTRAVENOUS
  Filled 2017-07-23: qty 100

## 2017-07-23 MED ORDER — FLEET ENEMA 7-19 GM/118ML RE ENEM
1.0000 | ENEMA | Freq: Once | RECTAL | Status: AC
  Administered 2017-07-23: 1 via RECTAL
  Filled 2017-07-23: qty 1

## 2017-07-23 NOTE — ED Notes (Signed)
PT states understanding of care given, follow up care, and medication prescribed. PT ambulated from ED to car with a steady gait. 

## 2017-07-23 NOTE — Discharge Instructions (Signed)
Please take miralax daily to help keep your stools soft. Pelase follow up with your PCP this week. You may use an enema in addition to help soften your stools from below.

## 2017-07-23 NOTE — ED Triage Notes (Signed)
Pt arrives via POv from home reports unable to have bowel movement in over a week. Pt reports trying several over the counter medications to relieve her symptoms but they havent worked. Pt also thinks she may have a urinary tract infection but does not produce urine due to dialysis, T, TH, SAT. VSS.

## 2017-07-23 NOTE — ED Provider Notes (Signed)
[place] DEPT Provider Note   CSN: [id number] Arrival date & time: 07/23/17  1643     History   Chief Complaint Chief Complaint  Patient presents with  . Constipation    HPI Jacqueline [name] is a 71 y.o. female.  HPI   Patient is 71 year old female with past medical history significant for end-stage renal disease on dialysis, CAD, presenting today with abdominal pain. Patient reports that she's got abdominal pain in her right lower quadrant and suprapubic area. She reports that she thinks "I might have a UTI". Patient does not make urine but she did dialysis patient. Patient says she gets that she "could be constipated". She has tried Ex-Lax, MiraLAX, and stool softener to no avail.  No fevers. No nausea no vomiting.  Past Medical History:  Diagnosis Date  . Arthritis    "left knee" (12/04/2016)  . CAD (coronary artery disease)    a. 12/2016 NSTEMI/PCI: LM nl, LaD 20p/d, D1 20, OM2 20, OM3 20, RCA 12m (3.0x18 Resolute DES). EF 65%.  . Chronic lower back pain   . ESRD on dialysis [facility])    "[facility]; [place]; TTS" (12/04/2016)  . GERD (gastroesophageal reflux disease)   . Hyperlipidemia   . Hypertension    a. 12/2016 labetalol d/c'd 2/2 hypotension in HD.  . Morbid obesity ([place])   . Obesity   . Pneumonia    "couple times" (12/04/2016)  . Renal insufficiency   . Type II diabetes mellitus [facility])     Patient Active Problem List   Diagnosis Date Noted  . NSTEMI (non-ST elevated myocardial infarction) ([place])   . Chest pain 12/03/2016  . Pain in lower limb 08/26/2014  . End stage renal disease ([place]) 03/15/2014  . HCAP (healthcare-associated pneumonia) 02/13/2014  . 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[place]) 01/28/2014  . OSA (obstructive sleep apnea) 01/21/2014  . 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[place]) 01/21/2014  . Acute respiratory failure ([place]) 01/19/2014  . Acute encephalopathy 01/19/2014  . Congestive heart failure ([place]) 01/15/2014  . ESRD (end stage  renal disease) on dialysis ([place]) 01/15/2014  . Hypertension 01/15/2014  . Diabetes mellitus ([place]) 01/15/2014  . Anemia 01/15/2014  . Acute diastolic CHF (congestive heart failure) ([place]) 01/15/2014  . Onychomycosis 03/26/2013  . Pain in joint, ankle and foot 03/26/2013    Past Surgical History:  Procedure Laterality Date  . ABDOMINAL HYSTERECTOMY    . AV FISTULA PLACEMENT Left 01/27/2014   Procedure: ARTERIOVENOUS (AV) FISTULA CREATION;  Surgeon: Mal Misty, MD;  Location: [place];  Service: Vascular;  Laterality: Left;  . AV FISTULA REPAIR     "had it cleaned out"  . CARDIAC CATHETERIZATION  ?1980s  . CARDIAC CATHETERIZATION N/A 12/04/2016   Procedure: Left Heart Cath Angiography;  Surgeon: Burnell Blanks, MD;  Location: [place] CV LAB;  Service: Cardiovascular;  Laterality: N/A;  . CARDIAC CATHETERIZATION N/A 12/04/2016   Procedure: Coronary Stent Intervention;  Surgeon: Burnell Blanks, MD;  Location: [place] CV LAB;  Service: Cardiovascular;  Laterality: N/A;  Mid RCA  . CATARACT EXTRACTION W/ INTRAOCULAR LENS IMPLANT Left   . COLONOSCOPY W/ BIOPSIES AND POLYPECTOMY    . CORONARY ANGIOPLASTY WITH STENT PLACEMENT  12/04/2016  . ESOPHAGOGASTRODUODENOSCOPY    . INSERTION OF DIALYSIS CATHETER N/A 01/19/2014   Procedure: INSERTION OF DIALYSIS CATHETER;  Surgeon: Rosetta Posner, MD;  Location: [facility] OR;  Service: Vascular;  Laterality: N/A;    OB History    No data available  Home Medications    Prior to Admission medications   Medication Sig Start Date End Date Taking? Authorizing Provider  aspirin EC 81 MG EC tablet Take 1 tablet (81 mg total) by mouth daily. 12/06/16  Yes Eulogio Bear U, DO  b complex vitamins capsule Take 1 capsule by mouth daily.   Yes [provider]  calcium acetate (PHOSLO) 667 MG capsule Take 667 mg by mouth See admin instructions. Take 667 mg by mouth 3 times daily with meals and take 667 mg by mouth with snacks. 10/21/16   Yes [provider]  docusate sodium (COLACE) 100 MG capsule Take 100 mg by mouth 2 (two) times daily.   Yes [provider]  ezetimibe (ZETIA) 10 MG tablet Take 1 tablet (10 mg total) by mouth daily. 03/03/17 07/23/17 Yes Turner, Eber Hong, MD  fish oil-omega-3 fatty acids 1000 MG capsule Take 1 g by mouth daily.     Yes [provider]  folic acid-vitamin b complex-vitamin c-selenium-zinc (DIALYVITE) 3 MG TABS tablet Take 1 tablet by mouth daily.   Yes [provider]  metoCLOPramide (REGLAN) 10 MG tablet Take 10 mg by mouth daily.    Yes [provider]  nitroGLYCERIN (NITROSTAT) 0.4 MG SL tablet Place 1 tablet (0.4 mg total) under the tongue every 5 (five) minutes as needed for chest pain. 12/09/16 07/23/17 Yes Turner, Eber Hong, MD  omeprazole (PRILOSEC) 20 MG capsule Take 20 mg by mouth daily. 10/23/16  Yes [provider]  ondansetron (ZOFRAN ODT) 4 MG disintegrating tablet 4mg  ODT q4 hours prn nausea/vomit 07/10/17  Yes Milton Ferguson, MD  rosuvastatin (CRESTOR) 40 MG tablet Take 1 tablet (40 mg total) by mouth daily. 01/15/17 01/10/18 Yes Turner, Eber Hong, MD  ticagrelor (BRILINTA) 90 MG TABS tablet Take 1 tablet (90 mg total) by mouth 2 (two) times daily. 01/03/17  Yes Dunn, Nedra Hai, PA-C    Family History Family History  Problem Relation Age of Onset  . Diabetes Mother   . Peripheral vascular disease Mother        amputation  . CAD Father   . Heart disease Father   . Hypertension Father   . Heart attack Father   . CAD Sister     Social History Social History  Substance Use Topics  . Smoking status: Former Smoker    Packs/day: 1.00    Years: 20.00    Types: Cigarettes    Quit date: 40  . Smokeless tobacco: Never Used  . Alcohol use No     Allergies   Sulfa drugs cross reactors   Review of Systems Review of Systems  Constitutional: Negative for activity change.  Respiratory: Negative for shortness of breath.     Cardiovascular: Negative for chest pain.  Gastrointestinal: Positive for abdominal distention and abdominal pain. Negative for diarrhea, nausea and vomiting.  All other systems reviewed and are negative.    Physical Exam Updated Vital Signs BP (!) 152/64 (BP Location: Right Arm)   Pulse 79   Temp 98.7 F (37.1 C) (Oral)   Resp 20   SpO2 97%   Physical Exam  Constitutional: She is oriented to person, place, and time. She appears well-developed and well-nourished.  HENT:  Head: Normocephalic and atraumatic.  Eyes: Right eye exhibits no discharge. Left eye exhibits no discharge.  Cardiovascular: Normal rate, regular rhythm and normal heart sounds.   No murmur heard. Pulmonary/Chest: Effort normal and breath sounds normal. She has no wheezes. She has no  rales.  Abdominal: Soft. She exhibits no distension. There is tenderness.  Right lower quadrant and suprapubic pain.  Neurological: She is oriented to person, place, and time.  Skin: Skin is warm and dry. She is not diaphoretic.  fistula  Psychiatric: She has a normal mood and affect.  Nursing note and vitals reviewed.    ED Treatments / Results  Labs (all labs ordered are listed, but only abnormal results are displayed) Labs Reviewed  COMPREHENSIVE METABOLIC PANEL  CBC WITH DIFFERENTIAL/PLATELET    EKG  EKG Interpretation None       Radiology No results found.  Procedures Procedures (including critical care time) Disimpaction.   Medications Ordered in ED Medications - No data to display   Initial Impression / Assessment and Plan / ED Course  I have reviewed the triage vital signs and the nursing notes.  Pertinent labs & imaging results that were available during my care of the patient were reviewed by me and considered in my medical decision making (see chart for details).    Patient's very pleasant 71 year old female presenting withwhat she thinks is constipation or urinary tract infection. Given the  tenderness on exam, we'll get CT abdomen pelvis.  11:08 PM CT negative, except for stool.  Helped disimpact patient with bimanual disimpaction.   Will have her increase Miralax at home.     Final Clinical Impressions(s) / ED Diagnoses   Final diagnoses:  None    New Prescriptions New Prescriptions   No medications on file     Macarthur Critchley, MD 07/23/17 2310

## 2017-07-30 ENCOUNTER — Ambulatory Visit
Admission: RE | Admit: 2017-07-30 | Discharge: 2017-07-30 | Disposition: A | Discharge status: Home / Self Care | Payer: Medicare Other | Source: Ambulatory Visit | Attending: Nurse Practitioner | Admitting: Nurse Practitioner

## 2017-07-30 ENCOUNTER — Ambulatory Visit: Payer: Medicare Other | Admitting: Podiatry

## 2017-07-30 ENCOUNTER — Ambulatory Visit
Admission: RE | Admit: 2017-07-30 | Discharge: 2017-07-30 | Disposition: A | Discharge status: Home / Self Care | Payer: Medicare Other | Source: Ambulatory Visit | Attending: Internal Medicine | Admitting: Internal Medicine

## 2017-07-30 DIAGNOSIS — E2839 Other primary ovarian failure: Secondary | ICD-10-CM

## 2017-07-30 DIAGNOSIS — Z1231 Encounter for screening mammogram for malignant neoplasm of breast: Secondary | ICD-10-CM

## 2017-09-01 DIAGNOSIS — Z992 Dependence on renal dialysis: Secondary | ICD-10-CM | POA: Insufficient documentation

## 2017-10-01 ENCOUNTER — Encounter: Payer: Self-pay | Admitting: Cardiology

## 2017-10-01 ENCOUNTER — Ambulatory Visit (INDEPENDENT_AMBULATORY_CARE_PROVIDER_SITE_OTHER): Payer: Medicare Other | Admitting: Cardiology

## 2017-10-01 VITALS — BP 132/62 | HR 69 | Ht 64.0 in | Wt 232.4 lb

## 2017-10-01 DIAGNOSIS — N186 End stage renal disease: Secondary | ICD-10-CM | POA: Diagnosis not present

## 2017-10-01 DIAGNOSIS — R001 Bradycardia, unspecified: Secondary | ICD-10-CM | POA: Diagnosis not present

## 2017-10-01 DIAGNOSIS — I2510 Atherosclerotic heart disease of native coronary artery without angina pectoris: Secondary | ICD-10-CM | POA: Diagnosis not present

## 2017-10-01 DIAGNOSIS — I5031 Acute diastolic (congestive) heart failure: Secondary | ICD-10-CM

## 2017-10-01 DIAGNOSIS — I10 Essential (primary) hypertension: Secondary | ICD-10-CM | POA: Diagnosis not present

## 2017-10-01 DIAGNOSIS — E662 Morbid (severe) obesity with alveolar hypoventilation: Secondary | ICD-10-CM

## 2017-10-01 DIAGNOSIS — Z992 Dependence on renal dialysis: Secondary | ICD-10-CM | POA: Diagnosis not present

## 2017-10-01 DIAGNOSIS — Z9861 Coronary angioplasty status: Secondary | ICD-10-CM

## 2017-10-01 DIAGNOSIS — E785 Hyperlipidemia, unspecified: Secondary | ICD-10-CM

## 2017-10-01 NOTE — Assessment & Plan Note (Signed)
S/P RCA PCI with DES 12/05/16-no significant Lt system disease

## 2017-10-01 NOTE — Progress Notes (Signed)
10/01/2017 [name]   1946/11/10  [id number]  Primary Physician Glendale Chard, MD Primary Cardiologist: Dr Radford Pax  HPI:  Pleasant 71 y/o morbidly obese, AA female followed by Dr Baird Cancer and Dr Marval Regal. She has ESRD and is on HD at the [place] center T/Th/Sat. She presented to the ED in Jan 2018 with nausea and vomiting and ruled in for an MI. Cath showed and 80% RCA and she underwent PCI with DES. She had minor left sytem disease of 20%. Post MI she had problems with hypotension on HD and labetalol was stopped. She is in the office today saying her HR got down to "48" while on HD. She denies any syncope or near syncope. I think she was instructed to make an OV appointment (though I could find no documentation of this). EKG in the office today shows NSR-with PACs with post PAC pause. Her overall rate is 68.    Current Outpatient Prescriptions  Medication Sig Dispense Refill  . aspirin EC 81 MG EC tablet Take 1 tablet (81 mg total) by mouth daily.    . B Complex-C-Folic Acid (DIALYVITE 478 PO) Take 0.8 mg by mouth daily.    . calcium acetate (PHOSLO) 667 MG capsule Take 667 mg by mouth See admin instructions. Take 667 mg by mouth 3 times daily with meals and take 667 mg by mouth with snacks.  10  . docusate sodium (COLACE) 100 MG capsule Take 100 mg by mouth 2 (two) times daily.    . metoCLOPramide (REGLAN) 10 MG tablet Take 10 mg by mouth daily.     . Omega-3 Fatty Acids (FISH OIL) 1200 MG CPDR Take 1 capsule by mouth daily.    Marland Kitchen omeprazole (PRILOSEC) 20 MG capsule Take 20 mg by mouth daily.  3  . ondansetron (ZOFRAN ODT) 4 MG disintegrating tablet 4mg  ODT q4 hours prn nausea/vomit 10 tablet 0  . polyethylene glycol (MIRALAX) packet Take 17 g by mouth daily. You can take up to 2-3 of these packets of MiraLAX a day. Make sure that you give yourself several hours after taking one. 14 each 0  . rosuvastatin (CRESTOR) 40 MG tablet Take 1 tablet (40 mg total) by mouth daily. 90 tablet 3   . ticagrelor (BRILINTA) 90 MG TABS tablet Take 1 tablet (90 mg total) by mouth 2 (two) times daily. 60 tablet 11  . ezetimibe (ZETIA) 10 MG tablet Take 1 tablet (10 mg total) by mouth daily. 30 tablet 9  . nitroGLYCERIN (NITROSTAT) 0.4 MG SL tablet Place 1 tablet (0.4 mg total) under the tongue every 5 (five) minutes as needed for chest pain. 25 tablet 3   No current facility-administered medications for this visit.     Allergies  Allergen Reactions  . Sulfa Drugs Cross Reactors Other (See Comments)    Doesn't remember     Past Medical History:  Diagnosis Date  . Arthritis    "left knee" (12/04/2016)  . CAD (coronary artery disease)    a. 12/2016 NSTEMI/PCI: LM nl, LaD 20p/d, D1 20, OM2 20, OM3 20, RCA 66m (3.0x18 Resolute DES). EF 65%.  . Chronic lower back pain   . ESRD on dialysis [facility])    "[facility]; [place]; TTS" (12/04/2016)  . GERD (gastroesophageal reflux disease)   . Hyperlipidemia   . Hypertension    a. 12/2016 labetalol d/c'd 2/2 hypotension in HD.  . Morbid obesity ([place])   . Obesity   . Pneumonia    "couple times" (  12/04/2016)  . Renal insufficiency   . Type II diabetes mellitus ([place])     Social History   Social History  . Marital status: Married    Spouse name: N/A  . Number of children: N/A  . Years of education: N/A   Occupational History  . Not on file.   Social History Main Topics  . Smoking status: Former Smoker    Packs/day: 1.00    Years: 20.00    Types: Cigarettes    Quit date: 81  . Smokeless tobacco: Never Used  . Alcohol use No  . Drug use: No  . Sexual activity: Not Currently   Other Topics Concern  . Not on file   Social History Narrative  . No narrative on file     Family History  Problem Relation Age of Onset  . Diabetes Mother   . Peripheral vascular disease Mother        amputation  . CAD Father   . Heart disease Father   . Hypertension Father   . Heart attack Father   . CAD Sister   . Breast cancer Neg  Hx      Review of Systems: General: negative for chills, fever, night sweats or weight changes.  Cardiovascular: negative for chest pain, dyspnea on exertion, edema, orthopnea, palpitations, paroxysmal nocturnal dyspnea or shortness of breath Dermatological: negative for rash Respiratory: negative for cough or wheezing Urologic: negative for hematuria Abdominal: negative for nausea, vomiting, diarrhea, bright red blood per rectum, melena, or hematemesis Neurologic: negative for visual changes, syncope, or dizziness All other systems reviewed and are otherwise negative except as noted above.    Blood pressure 132/62, pulse 69, height 5\' 4"  (1.626 m), weight 232 lb 6.4 oz (105.4 kg).  General appearance: alert, cooperative, no distress and moderately obese Neck: no carotid bruit and no JVD Lungs: clear to auscultation bilaterally Heart: regular rate and rhythm and soft systolic murmur AOV, preserved S2 Extremities: LUE AVF Skin: Skin color, texture, turgor normal. No rashes or lesions Neurologic: Grossly normal  EKG NSR, PACs, poor anterior RW  ASSESSMENT AND PLAN:   Bradycardia She was sent from the dialysis center with a history of bradycardia- HR 48. She is asymptomatic. I suspect it was bradycardia on telemetry secondary to PACs  CAD S/P percutaneous coronary angioplasty S/P RCA PCI with DES 12/05/16-no significant Lt system disease  Hypertension Controlled- H/O hypotension on dialysis  ESRD (end stage renal disease) on dialysis ([place]) ESRD-on HD T/Th/Sat  Obesity hypoventilation syndrome She denies snoring and says she sleeps well, not interested in a sleep study  Dyslipidemia LDL was 61 May 2018   PLAN  I made no changes to Ms Brower's medications. She is not on any medications that would affect her HR. She will contact us if she has any near syncope, syncope, or tachycardia. If so she'll need an event monitor. She wants to push her f/u to a year and I told her that  would be OK.   Kerin Ransom PA-C 10/01/2017 3:55 PM

## 2017-10-01 NOTE — Assessment & Plan Note (Signed)
ESRD-on HD T/Th/Sat

## 2017-10-01 NOTE — Assessment & Plan Note (Signed)
Controlled- H/O hypotension on dialysis

## 2017-10-01 NOTE — Assessment & Plan Note (Signed)
She denies snoring and says she sleeps well, not interested in a sleep study

## 2017-10-01 NOTE — Patient Instructions (Signed)
Medication Instructions:  Continue current medications  If you need a refill on your cardiac medications before your next appointment, please call your pharmacy.  Labwork: None Ordered   Testing/Procedures: None Ordered   Follow-Up: Your physician wants you to follow-up in: 1 Year with dr Radford Pax. You should receive a reminder letter in the mail two months in advance. If you do not receive a letter, please call our office [phone].   Thank you for choosing [facility] at [facility]!!

## 2017-10-01 NOTE — Assessment & Plan Note (Signed)
She was sent from the dialysis center with a history of bradycardia- HR 48. She is asymptomatic. I suspect it was bradycardia on telemetry secondary to PACs

## 2017-10-01 NOTE — Assessment & Plan Note (Signed)
LDL was 61 May 2018

## 2017-10-15 ENCOUNTER — Other Ambulatory Visit: Payer: Self-pay

## 2017-10-15 MED ORDER — ROSUVASTATIN CALCIUM 40 MG PO TABS: 40.0000 mg | ORAL_TABLET | Freq: Every day | ORAL | 3 refills | Status: DC

## 2017-10-29 ENCOUNTER — Ambulatory Visit: Payer: Medicare Other | Admitting: Podiatry

## 2017-11-24 ENCOUNTER — Other Ambulatory Visit: Payer: Self-pay

## 2017-11-24 MED ORDER — TICAGRELOR 90 MG PO TABS: 90.0000 mg | ORAL_TABLET | Freq: Two times a day (BID) | ORAL | 3 refills | Status: DC

## 2017-12-12 ENCOUNTER — Other Ambulatory Visit: Payer: Self-pay | Admitting: Cardiology

## 2017-12-12 DIAGNOSIS — E785 Hyperlipidemia, unspecified: Secondary | ICD-10-CM

## 2017-12-12 MED ORDER — EZETIMIBE 10 MG PO TABS: 10.0000 mg | ORAL_TABLET | Freq: Every day | ORAL | 2 refills | Status: DC

## 2018-01-30 ENCOUNTER — Other Ambulatory Visit: Payer: Self-pay | Admitting: Cardiology

## 2018-01-30 DIAGNOSIS — E785 Hyperlipidemia, unspecified: Secondary | ICD-10-CM

## 2018-05-07 ENCOUNTER — Encounter: Payer: Self-pay | Admitting: Cardiology

## 2018-05-21 ENCOUNTER — Encounter: Payer: Self-pay | Admitting: Gastroenterology

## 2018-06-23 NOTE — Progress Notes (Signed)
Cardiology Office Note    Date:  06/24/2018   ID:  Jacqueline [name], DOB 30-Nov-1946, MRN [id number]  PCP:  Jacqueline Chard, MD  Cardiologist: No primary care provider on file.  Chief Complaint  Patient presents with  . Blood In Stools    History of Present Illness:  Jacqueline [name] is a 72 y.o. female with history of CAD status post NSTEMI 12/2016 treated with DES to the RCA.  Post MI she had trouble with hypotension on HD and labetalol was stopped.  She also has hypertension, ESRD on HD, dyslipidemia, obesity hypoventilation syndrome,.  Patient last saw Kerin Ransom, [place] 10/01/2017 saying her heart rate got down to 48 while on hemodialysis.  She had no dizziness or syncope.  EKG in the office showed normal sinus rhythm with PACs and post PAC pauses.  Overall rate was 68.  She was asymptomatic and not on any heart rate lowering medications so no changes were made.  She comes in today accompanied by her husband.  She says she was sent over from the dialysis center to see if she could come off [place].  She is having a lot of bruising on her arm and has had black stools with a stool check with melena.  No bright red blood.  She has an appointment with GI in August.  She has chronic anemia with her ESRD.  She is not taking her Brilinta or aspirin with food.  She had single-vessel CAD.  Denies any chest pain, palpitations, dyspnea, dyspnea on exertion, dizziness or presyncope.    Past Medical History:  Diagnosis Date  . Arthritis    "left knee" (12/04/2016)  . CAD (coronary artery disease)    a. 12/2016 NSTEMI/PCI: LM nl, LaD 20p/d, D1 20, OM2 20, OM3 20, RCA 30m (3.0x18 Resolute DES). EF 65%.  . Chronic lower back pain   . ESRD on dialysis [facility])    "[facility]; TTS" (12/04/2016)  . GERD (gastroesophageal reflux disease)   . Hyperlipidemia   . Hypertension    a. 12/2016 labetalol d/c'd 2/2 hypotension in HD.  . Morbid obesity ([place])   . Obesity   . Pneumonia    "couple times" (12/04/2016)  . Renal insufficiency   . Type II diabetes mellitus ([place])     Past Surgical History:  Procedure Laterality Date  . ABDOMINAL HYSTERECTOMY    . AV FISTULA PLACEMENT Left 01/27/2014   Procedure: ARTERIOVENOUS (AV) FISTULA CREATION;  Surgeon: Mal Misty, MD;  Location: [place];  Service: Vascular;  Laterality: Left;  . AV FISTULA REPAIR     "had it cleaned out"  . CARDIAC CATHETERIZATION  ?1980s  . CARDIAC CATHETERIZATION N/A 12/04/2016   Procedure: Left Heart Cath Angiography;  Surgeon: Burnell Blanks, MD;  Location: [place] CV LAB;  Service: Cardiovascular;  Laterality: N/A;  . CARDIAC CATHETERIZATION N/A 12/04/2016   Procedure: Coronary Stent Intervention;  Surgeon: Burnell Blanks, MD;  Location: [place] CV LAB;  Service: Cardiovascular;  Laterality: N/A;  Mid RCA  . CATARACT EXTRACTION W/ INTRAOCULAR LENS IMPLANT Left   . COLONOSCOPY W/ BIOPSIES AND POLYPECTOMY    . CORONARY ANGIOPLASTY WITH STENT PLACEMENT  12/04/2016  . ESOPHAGOGASTRODUODENOSCOPY    . INSERTION OF DIALYSIS CATHETER N/A 01/19/2014   Procedure: INSERTION OF DIALYSIS CATHETER;  Surgeon: Rosetta Posner, MD;  Location: [facility] OR;  Service: Vascular;  Laterality: N/A;    Current Medications: Current Meds  Medication Sig  . aspirin EC  81 MG EC tablet Take 1 tablet (81 mg total) by mouth daily.  . B Complex-C-Folic Acid (DIALYVITE 099 PO) Take 0.8 mg by mouth daily.  . calcium acetate (PHOSLO) 667 MG capsule Take 667 mg by mouth See admin instructions. Take 667 mg by mouth 3 times daily with meals and take 667 mg by mouth with snacks.  . Cholecalciferol (VITAMIN D3 PO) Take 2,000 Units by mouth daily.  Marland Kitchen docusate sodium (COLACE) 100 MG capsule Take 100 mg by mouth 2 (two) times daily.  Marland Kitchen ezetimibe (ZETIA) 10 MG tablet Take 1 tablet (10 mg total) by mouth daily.  . metoCLOPramide (REGLAN) 10 MG tablet Take 10 mg by mouth 2 (two) times daily.   . Omega-3 Fatty Acids (FISH OIL) 1000  MG CPDR Take 1 capsule by mouth daily.   Marland Kitchen omeprazole (PRILOSEC) 20 MG capsule Take 20 mg by mouth daily.  . ondansetron (ZOFRAN ODT) 4 MG disintegrating tablet 4mg  ODT q4 hours prn nausea/vomit  . polyethylene glycol (MIRALAX) packet Take 17 g by mouth daily. You can take up to 2-3 of these packets of MiraLAX a day. Make sure that you give yourself several hours after taking one.  . rosuvastatin (CRESTOR) 40 MG tablet Take 1 tablet (40 mg total) daily by mouth.  . ticagrelor (BRILINTA) 90 MG TABS tablet Take 1 tablet (90 mg total) by mouth 2 (two) times daily.     Allergies:   Sulfa drugs cross reactors   Social History   Socioeconomic History  . Marital status: Married    Spouse name: Not on file  . Number of children: Not on file  . Years of education: Not on file  . Highest education level: Not on file  Occupational History  . Not on file  Social Needs  . Financial resource strain: Not on file  . Food insecurity:    Worry: Not on file    Inability: Not on file  . Transportation needs:    Medical: Not on file    Non-medical: Not on file  Tobacco Use  . Smoking status: Former Smoker    Packs/day: 1.00    Years: 20.00    Pack years: 20.00    Types: Cigarettes    Last attempt to quit: 1985    Years since quitting: 34.5  . Smokeless tobacco: Never Used  Substance and Sexual Activity  . Alcohol use: No    Alcohol/week: 0.0 oz  . Drug use: No  . Sexual activity: Not Currently  Lifestyle  . Physical activity:    Days per week: Not on file    Minutes per session: Not on file  . Stress: Not on file  Relationships  . Social connections:    Talks on phone: Not on file    Gets together: Not on file    Attends religious service: Not on file    Active member of club or organization: Not on file    Attends meetings of clubs or organizations: Not on file    Relationship status: Not on file  Other Topics Concern  . Not on file  Social History Narrative  . Not on file      Family History:  The patient's family history includes CAD in her father and sister; Diabetes in her mother; Heart attack in her father; Heart disease in her father; Hypertension in her father; Peripheral vascular disease in her mother.   ROS:   Please see the history of present illness.  Review of Systems  Constitution: Negative.  HENT: Negative.   Eyes: Negative.   Cardiovascular: Negative.   Respiratory: Negative.   Hematologic/Lymphatic: Negative.   Musculoskeletal: Negative.  Negative for joint pain.  Gastrointestinal: Positive for melena.  Genitourinary: Negative.   Neurological: Negative.    All other systems reviewed and are negative.   PHYSICAL EXAM:   VS:  BP 130/60   Pulse 63   Ht 5\' 4"  (1.626 m)   Wt 239 lb (108.4 kg)   SpO2 98%   BMI 41.02 kg/m   Physical Exam  GEN: Obese, in no acute distress  Neck: no JVD, carotid bruits, or masses Cardiac:RRR; with some skipping and 2/6 systolic murmur at the left sternal border Respiratory:  clear to auscultation bilaterally, normal work of breathing GI: soft, nontender, nondistended, + BS Ext: without cyanosis, clubbing, or edema, Good distal pulses bilaterally Neuro:  Alert and Oriented x 3 Psych: euthymic mood, full affect  Wt Readings from Last 3 Encounters:  06/24/18 239 lb (108.4 kg)  10/01/17 232 lb 6.4 oz (105.4 kg)  07/16/17 231 lb 7.7 oz (105 kg)      Studies/Labs Reviewed:   EKG:  EKG is ordered today.  The ekg ordered today demonstrates normal sinus rhythm with PACs  Recent Labs: 07/23/2017: ALT 18; BUN 32; Creatinine, Ser 8.83; Hemoglobin 9.4; Platelets 183; Potassium 3.3; Sodium 140   Lipid Panel    Component Value Date/Time   CHOL 133 04/14/2017 0840   TRIG 66 04/14/2017 0840   HDL 59 04/14/2017 0840   CHOLHDL 2.3 04/14/2017 0840   CHOLHDL 3.2 12/04/2016 0528   VLDL 9 12/04/2016 0528   LDLCALC 61 04/14/2017 0840    Additional studies/ records that were reviewed today include:  Cardiac  catheterization 12/04/2016   The left ventricular ejection fraction is greater than 65% by visual estimate.  The left ventricular systolic function is normal.  LV end diastolic pressure is normal.  There is no mitral valve regurgitation.  2nd Mrg lesion, 20 %stenosed.  3rd Mrg lesion, 20 %stenosed.  1st Diag lesion, 20 %stenosed.  Prox LAD to Dist LAD lesion, 20 %stenosed.  Mid RCA to Dist RCA lesion, 20 %stenosed.  A STENT RESOLUTE ONYX 3.0X18 drug eluting stent was successfully placed.  Mid RCA lesion, 80 %stenosed.  Post intervention, there is a 0% residual stenosis.   1. NSTEMI secondary to ulcerated plaque in the mid RCA 2. Mild disease in the LAD and Circumflex 3. Normal LV systolic function 4. Successful PTCA/DES x 1 mid RCA   Recommendations: Will continue DAPT with ASA and Brilinta for at least one year. Will start high dose statin. Beta blocker as tolerated.    2D echo 2/16/2015Study Conclusions  - Left ventricle: The cavity size was normal. There was mild   concentric hypertrophy. Systolic function was normal. The   estimated ejection fraction was in the range of 55% to   60%. Wall motion was normal; there were no regional wall   motion abnormalities. Left ventricular diastolic function   parameters were normal. - Mitral valve: Calcified annulus. - Left atrium: The atrium was mildly dilated. - Right ventricle: The cavity size was mildly dilated. Wall   thickness was normal. - Right atrium: The atrium was mildly dilated. - Atrial septum: No defect or patent foramen ovale was   identified. Transthoracic echocardiography.  M-mode, complete 2D, spectral Doppler, and color Doppler.  Weight:  Weight: 146.4kg. Weight: 322lb.  Blood pressure:     152/55.  Patient status:  Inpatient.  Location:  Echo laboratory.   ASSESSMENT:    1. CAD S/P percutaneous coronary angioplasty   2. Essential hypertension   3. Diastolic congestive heart failure, unspecified HF  chronicity ([place])   4. ESRD (end stage renal disease) on dialysis ([place])   5. Dyslipidemia      PLAN:  In order of problems listed above:  CAD status post DES to the RCA, normal LVEF 12/2016 single-vessel disease.  No angina on Brilinta and aspirin but not taking with food and having melena.  Scheduled to see GI in August.  Says her hemoglobin came back up when checked at the dialysis center.  I have asked her to continue aspirin and Brilinta with food for the time being and will ask Dr. Radford Pax to evaluate and see if she is okay with her stopping the Brilinta.  [place] up with Dr. Radford Pax in 4 to 6 months.  Essential hypertension blood pressure well controlled  Diastolic CHF compensated and managed with dialysis  ESRD on HD  Dyslipidemia on Crestor and Zetia with LDL of 50 912/2018    Medication Adjustments/Labs and Tests Ordered: Current medicines are reviewed at length with the patient today.  Concerns regarding medicines are outlined above.  Medication changes, Labs and Tests ordered today are listed in the Patient Instructions below. Patient Instructions  Medication Instructions:  Your physician recommends that you continue on your current medications as directed. Please refer to the Current Medication list given to you today.  MAKE SURE TO TAKE YOUR ASPIRIN AND BRILINTA   Labwork: None ordered  Testing/Procedures: None ordered  Follow-Up: Your physician wants you to follow-up in: 1 year with Dr. Irish Lack. You will receive a reminder letter in the mail two months in advance. If you don't receive a letter, please call our office to schedule the follow-up appointment.   Any Other Special Instructions Will Be Listed Below (If Applicable).     If you need a refill on your cardiac medications before your next appointment, please call your pharmacy.      Sumner Boast, PA-C  06/24/2018 9:47 AM    [facility] [place], [place], [place]   [address] Phone: [phone]; Fax: [phone]

## 2018-06-24 ENCOUNTER — Ambulatory Visit: Payer: Medicare Other | Admitting: Physician Assistant

## 2018-06-24 ENCOUNTER — Encounter (INDEPENDENT_AMBULATORY_CARE_PROVIDER_SITE_OTHER): Payer: Self-pay

## 2018-06-24 ENCOUNTER — Encounter: Payer: Self-pay | Admitting: Physician Assistant

## 2018-06-24 VITALS — BP 130/60 | HR 63 | Ht 64.0 in | Wt 239.0 lb

## 2018-06-24 DIAGNOSIS — I5030 Unspecified diastolic (congestive) heart failure: Secondary | ICD-10-CM | POA: Diagnosis not present

## 2018-06-24 DIAGNOSIS — Z9861 Coronary angioplasty status: Secondary | ICD-10-CM

## 2018-06-24 DIAGNOSIS — E785 Hyperlipidemia, unspecified: Secondary | ICD-10-CM

## 2018-06-24 DIAGNOSIS — I2510 Atherosclerotic heart disease of native coronary artery without angina pectoris: Secondary | ICD-10-CM

## 2018-06-24 DIAGNOSIS — N186 End stage renal disease: Secondary | ICD-10-CM | POA: Diagnosis not present

## 2018-06-24 DIAGNOSIS — Z992 Dependence on renal dialysis: Secondary | ICD-10-CM

## 2018-06-24 DIAGNOSIS — I10 Essential (primary) hypertension: Secondary | ICD-10-CM | POA: Diagnosis not present

## 2018-06-24 NOTE — Patient Instructions (Addendum)
Medication Instructions:  Your physician recommends that you continue on your current medications as directed. Please refer to the Current Medication list given to you today.  MAKE SURE TO TAKE YOUR ASPIRIN AND BRILINTA WITH FOOD.  Labwork: None ordered  Testing/Procedures: None ordered  Follow-Up: Your physician wants you to follow-up in: [place].  Any Other Special Instructions Will Be Listed Below (If Applicable).     If you need a refill on your cardiac medications before your next appointment, please call your pharmacy.

## 2018-06-29 ENCOUNTER — Telehealth: Payer: Self-pay | Admitting: *Deleted

## 2018-06-29 NOTE — Telephone Encounter (Signed)
I called pt and went over recommendations per Dr. Radford Pax and Ermalinda Barrios, PA. Pt is agreeable to plan of care to hold Brilinta for now though remain on ASA 81 mg daily. I did stress to the pt it is very important to make sure she eats when taking the ASA to help protect her stomach lining. Pt is agreeable to plan of care and thanked me for the call. Pt verbalized understanding by phone x 1.

## 2018-06-29 NOTE — Telephone Encounter (Signed)
-----   Message from Imogene Burn, PA-C sent at 06/24/2018  3:06 PM EDT ----- Can you let this patient know that Dr. Radford Pax reviewed her chart and says it is okay for her to stop the [place] but make sure she continues aspirin 81 mg daily with food ----- Message ----- From: Sueanne Margarita, MD Sent: 06/24/2018   1:35 PM To: Imogene Burn, PA-C  I'm fine with that Traci ----- Message ----- From: Murrell Converse Sent: 06/24/2018   9:54 AM To: Sueanne Margarita, MD  Dr. Radford Pax, can you evaluate this patient who had DES 12/2016 and is now having some melena on Brilinta and aspirin but has not been taking with food.  She has an evaluation with GI in August.  Would you be okay if she stops her Brilinta and stays on aspirin alone?  Thanks [employer]

## 2018-07-22 ENCOUNTER — Ambulatory Visit: Payer: Medicare Other | Admitting: Gastroenterology

## 2018-07-22 ENCOUNTER — Encounter: Payer: Self-pay | Admitting: Gastroenterology

## 2018-07-22 VITALS — BP 96/62 | HR 71 | Ht 64.0 in | Wt 240.2 lb

## 2018-07-22 DIAGNOSIS — R195 Other fecal abnormalities: Secondary | ICD-10-CM | POA: Diagnosis not present

## 2018-07-22 DIAGNOSIS — E118 Type 2 diabetes mellitus with unspecified complications: Secondary | ICD-10-CM

## 2018-07-22 DIAGNOSIS — N186 End stage renal disease: Secondary | ICD-10-CM | POA: Diagnosis not present

## 2018-07-22 DIAGNOSIS — G4733 Obstructive sleep apnea (adult) (pediatric): Secondary | ICD-10-CM | POA: Diagnosis not present

## 2018-07-22 DIAGNOSIS — Z992 Dependence on renal dialysis: Secondary | ICD-10-CM

## 2018-07-22 DIAGNOSIS — N189 Chronic kidney disease, unspecified: Secondary | ICD-10-CM

## 2018-07-22 DIAGNOSIS — Z9861 Coronary angioplasty status: Secondary | ICD-10-CM

## 2018-07-22 DIAGNOSIS — I2510 Atherosclerotic heart disease of native coronary artery without angina pectoris: Secondary | ICD-10-CM

## 2018-07-22 DIAGNOSIS — D631 Anemia in chronic kidney disease: Secondary | ICD-10-CM

## 2018-07-22 NOTE — Progress Notes (Signed)
[facility] Consult Note:  History: Jacqueline [name] 07/22/2018  Referring physician: Katrine Coho, MD [facility])  Reason for consult/chief complaint: Melena (stools were dark and had bruises on arms, Dr took pt off Brilinta and sx's cleared up, no complaints today, last colon 15-20 years ago)   Subjective  HPI:  This is a pleasant 72 year old woman referred by her nephrologist for heme positive stool.  She apparently had some dark stool and bruising on her arms within the last few months.  Stool test was reportedly positive for blood based on her referral notes.  We have no hemoglobin or CBC values with the records that accompany the referral.  The patient appears to have been been sent to cardiology, and I reviewed their note from July 24.  Given the reported anemia and heme positive stool and dark stool that was assumed possibly to have been melena, her Brilinta was stopped and she is maintained on just aspirin.  The details of her cardiac condition are in that note, including the patient's last coronary stent placement in January 2018. Quincee tells me that after stopping the Brilinta, she no longer had dark stool or bruising on her arms.  She denies abdominal pain altered bowel habits of bright red blood per rectum.  She denies dysphagia or odynophagia or nausea or vomiting.  ROS:  Review of Systems  Constitutional: Negative for appetite change and unexpected weight change.  HENT: Negative for mouth sores and voice change.   Eyes: Negative for pain and redness.  Respiratory: Negative for cough and shortness of breath.   Cardiovascular: Negative for chest pain and palpitations.  Genitourinary: Negative for dysuria and hematuria.  Musculoskeletal: Positive for arthralgias. Negative for myalgias.  Skin: Negative for pallor and rash.  Neurological: Negative for weakness and headaches.  Hematological: Negative for adenopathy.  Psychiatric/Behavioral:  Positive for dysphoric mood. The patient is nervous/anxious.      Past Medical History: Past Medical History:  Diagnosis Date  . Arthritis    "left knee" (12/04/2016)  . CAD (coronary artery disease)    a. 12/2016 NSTEMI/PCI: LM nl, LaD 20p/d, D1 20, OM2 20, OM3 20, RCA 42m (3.0x18 Resolute DES). EF 65%.  . Chronic lower back pain   . Diverticulosis    on CT  . ESRD on dialysis [facility])    "[facility]; [place]; TTS" (12/04/2016)  . GERD (gastroesophageal reflux disease)   . Hiatal hernia   . Hyperlipidemia   . Hypertension    a. 12/2016 labetalol d/c'd 2/2 hypotension in HD.  . Morbid obesity ([place])   . Obesity   . Pneumonia    "couple times" (12/04/2016)  . Renal insufficiency   . Type II diabetes mellitus [facility])    Dialysis started February 2015, end-stage renal disease from diabetes and hypertension. NSTEMI January 2018 with PTCA/DES to the mid RCA, was then on DAPT for 1 year (according to nephrology office note)   T/Th/Sat HD  Past Surgical History: Past Surgical History:  Procedure Laterality Date  . ABDOMINAL HYSTERECTOMY    . AV FISTULA PLACEMENT Left 01/27/2014   Procedure: ARTERIOVENOUS (AV) FISTULA CREATION;  Surgeon: Mal Misty, MD;  Location: [place];  Service: Vascular;  Laterality: Left;  . AV FISTULA REPAIR     "had it cleaned out"  . CARDIAC CATHETERIZATION  ?1980s  . CARDIAC CATHETERIZATION N/A 12/04/2016   Procedure: Left Heart Cath Angiography;  Surgeon: Burnell Blanks, MD;  Location: [place] CV LAB;  Service: Cardiovascular;  Laterality: N/A;  . CARDIAC CATHETERIZATION N/A 12/04/2016   Procedure: Coronary Stent Intervention;  Surgeon: Burnell Blanks, MD;  Location: [place] CV LAB;  Service: Cardiovascular;  Laterality: N/A;  Mid RCA  . CATARACT EXTRACTION W/ INTRAOCULAR LENS IMPLANT Left   . COLONOSCOPY W/ BIOPSIES AND POLYPECTOMY    . CORONARY ANGIOPLASTY WITH STENT PLACEMENT  12/04/2016  . ESOPHAGOGASTRODUODENOSCOPY    .  INSERTION OF DIALYSIS CATHETER N/A 01/19/2014   Procedure: INSERTION OF DIALYSIS CATHETER;  Surgeon: Rosetta Posner, MD;  Location: [facility];  Service: Vascular;  Laterality: N/A;     Family History: Family History  Problem Relation Age of Onset  . Diabetes Mother   . Peripheral vascular disease Mother        amputation  . CAD Father   . Heart disease Father   . Hypertension Father   . Heart attack Father   . CAD Sister   . Cancer Brother        x 2 brothers, type unknown  . Breast cancer Sister     Social History: Social History   Socioeconomic History  . Marital status: Married    Spouse name: Not on file  . Number of children: 4  . Years of education: Not on file  . Highest education level: Not on file  Occupational History  . Not on file  Social Needs  . Financial resource strain: Not on file  . Food insecurity:    Worry: Not on file    Inability: Not on file  . Transportation needs:    Medical: Not on file    Non-medical: Not on file  Tobacco Use  . Smoking status: Former Smoker    Packs/day: 1.00    Years: 20.00    Pack years: 20.00    Types: Cigarettes    Last attempt to quit: 1985    Years since quitting: 34.6  . Smokeless tobacco: Never Used  Substance and Sexual Activity  . Alcohol use: No    Alcohol/week: 0.0 standard drinks  . Drug use: No  . Sexual activity: Not Currently  Lifestyle  . Physical activity:    Days per week: Not on file    Minutes per session: Not on file  . Stress: Not on file  Relationships  . Social connections:    Talks on phone: Not on file    Gets together: Not on file    Attends religious service: Not on file    Active member of club or organization: Not on file    Attends meetings of clubs or organizations: Not on file    Relationship status: Not on file  Other Topics Concern  . Not on file  Social History Narrative  . Not on file    Allergies: Allergies  Allergen Reactions  . Sulfa Drugs Cross Reactors Other  (See Comments)    Doesn't remember     Outpatient Meds: Current Outpatient Medications  Medication Sig Dispense Refill  . aspirin EC 81 MG EC tablet Take 1 tablet (81 mg total) by mouth daily.    . B Complex-C-Folic Acid (DIALYVITE 979 PO) Take 0.8 mg by mouth daily.    . calcium acetate (PHOSLO) 667 MG capsule Take 667 mg by mouth See admin instructions. Take 667 mg by mouth 3 times daily with meals and take 667 mg by mouth with snacks.  10  . Cholecalciferol (VITAMIN D3 PO) Take 4,000 Units by mouth daily.     Marland Kitchen  docusate sodium (COLACE) 100 MG capsule Take 100 mg by mouth as needed.     . ezetimibe (ZETIA) 10 MG tablet Take 1 tablet (10 mg total) by mouth daily. 90 tablet 2  . metoCLOPramide (REGLAN) 10 MG tablet Take 10 mg by mouth 2 (two) times daily.     . Omega-3 Fatty Acids (FISH OIL) 1000 MG CPDR Take 1 capsule by mouth daily.     Marland Kitchen omeprazole (PRILOSEC) 20 MG capsule Take 20 mg by mouth daily.  3  . ondansetron (ZOFRAN-ODT) 4 MG disintegrating tablet Take 4 mg by mouth every 4 (four) hours as needed for nausea or vomiting.    . polyethylene glycol (MIRALAX) packet Take 17 g by mouth daily. You can take up to 2-3 of these packets of MiraLAX a day. Make sure that you give yourself several hours after taking one. (Patient taking differently: Take 17 g by mouth as needed. You can take up to 2-3 of these packets of MiraLAX a day. Make sure that you give yourself several hours after taking one.) 14 each 0  . rosuvastatin (CRESTOR) 40 MG tablet Take 1 tablet (40 mg total) daily by mouth. 90 tablet 3  . nitroGLYCERIN (NITROSTAT) 0.4 MG SL tablet Place 1 tablet (0.4 mg total) under the tongue every 5 (five) minutes as needed for chest pain. (Patient not taking: Reported on 07/22/2018) 25 tablet 3   No current facility-administered medications for this visit.       ___________________________________________________________________ Objective   Exam:  BP 96/62 (BP Location: Right Arm,  Patient Position: Sitting, Cuff Size: Large)   Pulse 71   Ht 5\' 4"  (1.626 m) Comment: height measured without shoes  Wt 240 lb 4 oz (109 kg)   BMI 41.24 kg/m   I rechecked BP manually and it was 140/60   General: this is a(n) chronically ill-appearing woman accompanied by her husband.  Antalgic gait, gets on exam table slowly and without assistance  Eyes: sclera anicteric, no redness  ENT: oral mucosa moist without lesions, no cervical or supraclavicular lymphadenopathy,  CV: RRR without murmur, S1/S2, no JVD, no peripheral edema  Resp: clear to auscultation bilaterally, normal RR and effort noted  GI: soft, no tenderness, with active bowel sounds. No guarding or palpable organomegaly noted (limited by body habitus).  Lower midline vertical scar from distant hysterectomy  Skin; warm and dry, no rash or jaundice noted  Neuro: awake, alert and oriented x 3. Normal gross motor function and fluent speech  Labs:  No recent labs in referral notes/records. Creatinine 9.95 on 04/23/18  Radiologic Studies:  None  Assessment: Encounter Diagnoses  Name Primary?  . Heme positive stool Yes  . CAD S/P percutaneous coronary angioplasty   . OSA (obstructive sleep apnea)   . ESRD (end stage renal disease) on dialysis ([place])   . Type 2 diabetes mellitus with complication, unspecified whether long term insulin use ([place])   . Anemia of chronic kidney failure, unspecified stage     Reported heme positive stool of unclear significance with the reported symptoms.  It is impossible to know if the dark stool was really overt bleeding such as melena, and if so, it source in the digestive tract.  She reports those symptoms have stopped off Brilinta, but her recent hemoglobin is unknown.  Plan:  I recommended upper endoscopy and colonoscopy to look for possible source of occult blood loss.  She was anxious about the risks of the procedure and they were described in  detail along with the expected  benefits.  She did not wish to proceed at this time, preferring to give it more consideration and contact us later if she decides to do so. I therefore recommend close follow-up with primary care and nephrology.  It sounds like she has labs checked every week at dialysis, and I will wait to hear from her or her physicians as need be.  Thank you for the courtesy of this consult.  Please call me with any questions or concerns.  Nelida Meuse III  CC: [place] Chard, MD Katrine Coho, MD

## 2018-07-22 NOTE — Patient Instructions (Signed)
If you are age 74 or older, your body mass index should be between 23-30. Your Body mass index is 41.24 kg/m. If this is out of the aforementioned range listed, please consider follow up with your Primary Care Provider.  If you are age 66 or younger, your body mass index should be between 19-25. Your Body mass index is 41.24 kg/m. If this is out of the aformentioned range listed, please consider follow up with your Primary Care Provider.   It has been recommended to you by your physician that you have a(n) Colonoscopy/Endoscopy completed. Per your request, we did not schedule the procedure(s) today. Please contact our office at [phone] should you decide to have the procedure completed.  It was a pleasure to see you today!  Dr. Loletha Carrow

## 2018-07-28 ENCOUNTER — Other Ambulatory Visit: Payer: Self-pay | Admitting: Nurse Practitioner

## 2018-07-28 DIAGNOSIS — Z1231 Encounter for screening mammogram for malignant neoplasm of breast: Secondary | ICD-10-CM

## 2018-08-21 ENCOUNTER — Ambulatory Visit: Payer: Medicare Other | Admitting: Podiatry

## 2018-08-21 ENCOUNTER — Encounter: Payer: Self-pay | Admitting: Podiatry

## 2018-08-21 VITALS — BP 118/53 | HR 74

## 2018-08-21 DIAGNOSIS — E1159 Type 2 diabetes mellitus with other circulatory complications: Secondary | ICD-10-CM

## 2018-08-21 DIAGNOSIS — B351 Tinea unguium: Secondary | ICD-10-CM

## 2018-08-21 DIAGNOSIS — Q828 Other specified congenital malformations of skin: Secondary | ICD-10-CM | POA: Diagnosis not present

## 2018-08-21 DIAGNOSIS — M79672 Pain in left foot: Secondary | ICD-10-CM | POA: Diagnosis not present

## 2018-08-21 DIAGNOSIS — M79671 Pain in right foot: Secondary | ICD-10-CM

## 2018-08-21 NOTE — Progress Notes (Signed)
This patient presents to the office with chief complaint of long thick nails and diabetic feet.  This patient  says there  is  no pain and discomfort in their feet.  This patient says there are long thick painful nails.  These nails are painful walking and wearing shoes.  Patient has no history of infection or drainage from both feet.  Patient is unable to  self treat his own nails .  Patient also has painful callus under her left big toe. This patient presents  to the office today for treatment of the  long nails and a foot evaluation due to history of  diabetes.  General Appearance  Alert, conversant and in no acute stress.  Vascular  Dorsalis pedis and posterior tibial  pulses are not  palpable  bilaterally.  Capillary return is within normal limits  Bilaterally.  Cold feet.  Neurologic  Senn-Weinstein monofilament wire test within normal limits  bilaterally. Muscle power within normal limits bilaterally.  Nails Thick disfigured discolored nails with subungual debris  from hallux to fifth toes bilaterally. No evidence of bacterial infection or drainage bilaterally.  Orthopedic  No limitations of motion of motion feet .  No crepitus or effusions noted.  No bony pathology or digital deformities noted. Mild  HAV  B/L  Skin  normotropic skin with no porokeratosis noted bilaterally.  No signs of infections or ulcers noted.     Onychomycosis  Diabetes with vascular pathology. Callus left hallux  IE  Debride nails x 10.  A diabetic foot exam was performed and there is no evidence of  neurologic pathology.  No pulses palpated.    Debride callus left hallux.RTC 3 months.   Gardiner Barefoot DPM

## 2018-09-02 ENCOUNTER — Ambulatory Visit
Admission: RE | Admit: 2018-09-02 | Discharge: 2018-09-02 | Disposition: A | Discharge status: Home / Self Care | Payer: Medicare Other | Source: Ambulatory Visit | Attending: Nurse Practitioner | Admitting: Nurse Practitioner

## 2018-09-02 DIAGNOSIS — Z1231 Encounter for screening mammogram for malignant neoplasm of breast: Secondary | ICD-10-CM

## 2018-09-04 ENCOUNTER — Telehealth: Payer: Self-pay

## 2018-09-04 NOTE — Telephone Encounter (Signed)
Pt called stating that dialysis are doing penumonia shots and she wanted to know if she was due to get one.   Returned pt call and advised her that if it is the prevnar 13 she can get one per [name] FNP-BC. YRL,RMA

## 2018-10-24 ENCOUNTER — Other Ambulatory Visit: Payer: Self-pay | Admitting: Nurse Practitioner

## 2018-11-16 ENCOUNTER — Ambulatory Visit: Payer: Medicare Other | Admitting: Cardiology

## 2018-11-20 ENCOUNTER — Ambulatory Visit: Payer: Medicare Other | Admitting: Podiatry

## 2018-11-20 ENCOUNTER — Encounter: Payer: Self-pay | Admitting: Nurse Practitioner

## 2018-11-20 ENCOUNTER — Ambulatory Visit: Payer: Medicare Other | Admitting: Nurse Practitioner

## 2018-11-20 VITALS — BP 132/62 | HR 80 | Temp 98.4°F | Ht 62.6 in | Wt 241.6 lb

## 2018-11-20 DIAGNOSIS — I132 Hypertensive heart and chronic kidney disease with heart failure and with stage 5 chronic kidney disease, or end stage renal disease: Secondary | ICD-10-CM | POA: Diagnosis not present

## 2018-11-20 DIAGNOSIS — E1129 Type 2 diabetes mellitus with other diabetic kidney complication: Secondary | ICD-10-CM | POA: Diagnosis not present

## 2018-11-20 DIAGNOSIS — M79601 Pain in right arm: Secondary | ICD-10-CM

## 2018-11-20 DIAGNOSIS — R21 Rash and other nonspecific skin eruption: Secondary | ICD-10-CM

## 2018-11-20 DIAGNOSIS — R809 Proteinuria, unspecified: Secondary | ICD-10-CM

## 2018-11-20 DIAGNOSIS — I509 Heart failure, unspecified: Secondary | ICD-10-CM | POA: Diagnosis not present

## 2018-11-20 DIAGNOSIS — N186 End stage renal disease: Secondary | ICD-10-CM | POA: Diagnosis not present

## 2018-11-20 DIAGNOSIS — I209 Angina pectoris, unspecified: Secondary | ICD-10-CM

## 2018-11-20 DIAGNOSIS — Z992 Dependence on renal dialysis: Secondary | ICD-10-CM

## 2018-11-20 DIAGNOSIS — I10 Essential (primary) hypertension: Secondary | ICD-10-CM

## 2018-11-20 DIAGNOSIS — E782 Mixed hyperlipidemia: Secondary | ICD-10-CM

## 2018-11-20 MED ORDER — BENZOYL PEROXIDE 5 % EX LIQD: Freq: Two times a day (BID) | CUTANEOUS | 12 refills | Status: DC

## 2018-11-20 MED ORDER — DICLOFENAC SODIUM 1 % TD GEL: 2.0000 g | Freq: Four times a day (QID) | TRANSDERMAL | 3 refills | Status: DC

## 2018-11-20 NOTE — Progress Notes (Addendum)
Subjective:     Patient ID: Jacqueline [name] , female    DOB: Aug 07, 1946 , 72 y.o.   MRN: [id number]   Chief Complaint  Patient presents with  . Diabetes  . Hyperlipidemia    HPI  Diabetes  She presents for her follow-up diabetic visit. She has type 2 diabetes mellitus. Pertinent negatives for hypoglycemia include no confusion. Pertinent negatives for diabetes include no blurred vision, no fatigue, no polydipsia, no polyphagia, no polyuria and no weight loss. Symptoms are stable. There are no known risk factors for coronary artery disease. When asked about current treatments, none were reported. Eye exam is not current.  Hyperlipidemia  This is a chronic problem. The current episode started more than 1 year ago. The problem is controlled. Recent lipid tests were reviewed and are variable. Exacerbating diseases include chronic renal disease, diabetes and obesity. She has no history of hypothyroidism. There are no known factors aggravating her hyperlipidemia. Current antihyperlipidemic treatment includes ezetimibe. The current treatment provides moderate improvement of lipids. There are no compliance problems.  Risk factors for coronary artery disease include diabetes mellitus, a sedentary lifestyle and obesity.     Past Medical History:  Diagnosis Date  . Arthritis    "left knee" (12/04/2016)  . CAD (coronary artery disease)    a. 12/2016 NSTEMI/PCI: LM nl, LaD 20p/d, D1 20, OM2 20, OM3 20, RCA 68m (3.0x18 Resolute DES). EF 65%.  . Chronic lower back pain   . Diverticulosis    on CT  . ESRD on dialysis [facility])    "[facility]; [place]; TTS" (12/04/2016)  . GERD (gastroesophageal reflux disease)   . Hiatal hernia   . Hyperlipidemia   . Hypertension    a. 12/2016 labetalol d/c'd 2/2 hypotension in HD.  . Morbid obesity ([place])   . Obesity   . Pneumonia    "couple times" (12/04/2016)  . Renal insufficiency   . Type II diabetes mellitus (HCC)      Family History  Problem  Relation Age of Onset  . Diabetes Mother   . Peripheral vascular disease Mother        amputation  . CAD Father   . Heart disease Father   . Hypertension Father   . Heart attack Father   . CAD Sister   . Cancer Brother        x 2 brothers, type unknown  . Breast cancer Sister      Current Outpatient Medications:  .  aspirin EC 81 MG EC tablet, Take 1 tablet (81 mg total) by mouth daily., Disp: , Rfl:  .  B Complex-C-Folic Acid (DIALYVITE 562 PO), Take 0.8 mg by mouth daily., Disp: , Rfl:  .  calcium acetate (PHOSLO) 667 MG capsule, Take 667 mg by mouth See admin instructions. Take 667 mg by mouth 3 times daily with meals and take 667 mg by mouth with snacks., Disp: , Rfl: 10 .  ezetimibe (ZETIA) 10 MG tablet, Take 1 tablet (10 mg total) by mouth daily., Disp: 90 tablet, Rfl: 2 .  nitroGLYCERIN (NITROSTAT) 0.4 MG SL tablet, Place 1 tablet (0.4 mg total) under the tongue every 5 (five) minutes as needed for chest pain., Disp: 25 tablet, Rfl: 3 .  Omega-3 Fatty Acids (FISH OIL) 1000 MG CPDR, Take 1 capsule by mouth daily. , Disp: , Rfl:  .  omeprazole (PRILOSEC) 20 MG capsule, Take 20 mg by mouth daily., Disp: , Rfl: 3 .  ondansetron (ZOFRAN-ODT) 4 MG disintegrating tablet,  Take 4 mg by mouth every 4 (four) hours as needed for nausea or vomiting., Disp: , Rfl:  .  polyethylene glycol (MIRALAX) packet, Take 17 g by mouth daily. You can take up to 2-3 of these packets of MiraLAX a day. Make sure that you give yourself several hours after taking one. (Patient taking differently: Take 17 g by mouth as needed. You can take up to 2-3 of these packets of MiraLAX a day. Make sure that you give yourself several hours after taking one.), Disp: 14 each, Rfl: 0 .  rosuvastatin (CRESTOR) 40 MG tablet, Take 1 tablet (40 mg total) daily by mouth., Disp: 90 tablet, Rfl: 3 .  metoCLOPramide (REGLAN) 10 MG tablet, TAKE 1 TABLET BY ORAL ROUTE 2 TIMES EVERY DAY 30 MINUTES BEFORE MEALS AND AT BEDTIME (Patient not  taking: Reported on 11/20/2018), Disp: 60 tablet, Rfl: 1   Allergies  Allergen Reactions  . Sulfa Drugs Cross Reactors Other (See Comments)    Doesn't remember      Review of Systems  Constitutional: Negative for fatigue and weight loss.  Eyes: Negative for blurred vision.  Respiratory: Negative.   Cardiovascular: Negative.   Endocrine: Negative for polydipsia, polyphagia and polyuria.  Musculoskeletal: Positive for arthralgias (right arm pain).  Skin: Positive for rash (face and upper neck/chest).  Psychiatric/Behavioral: Negative for confusion.    Today's Vitals   11/20/18 0849  BP: 132/62  Pulse: 80  Temp: 98.4 F (36.9 C)  TempSrc: Oral  SpO2: 93%  Weight: 241 lb 9.6 oz (109.6 kg)  Height: 5' 2.6" (1.59 m)  PainSc: 0-No pain   Body mass index is 43.35 kg/m.   Objective:  Physical Exam Vitals signs reviewed.  Constitutional:      Appearance: She is well-developed.  Eyes:     Pupils: Pupils are equal, round, and reactive to light.  Neck:     Musculoskeletal: Normal range of motion and neck supple.  Cardiovascular:     Rate and Rhythm: Normal rate and regular rhythm.     Heart sounds: Normal heart sounds. No murmur.  Pulmonary:     Effort: Pulmonary effort is normal.     Breath sounds: Normal breath sounds.  Chest:     Chest wall: No tenderness.  Musculoskeletal: Normal range of motion.  Skin:    General: Skin is warm and dry.     Capillary Refill: Capillary refill takes less than 2 seconds.     Findings: Rash (papular lesions to left side of face near nose and upper neck/chest) present.  Neurological:     General: No focal deficit present.     Mental Status: She is alert and oriented to person, place, and time.     Cranial Nerves: No cranial nerve deficit.  Psychiatric:        Mood and Affect: Mood normal.         Assessment And Plan:     1. Essential hypertension . B/P is fairly controlled.  . CMP ordered to check renal function.  . The  importance of regular exercise and dietary modification was stressed to the patient.  . Stressed importance of losing ten percent of her body weight to help with B/P control.  . The weight loss would help with decreasing cardiac and cancer risk as well.   2. Type 2 diabetes mellitus with microalbuminuria, without long-term current use of insulin (HCC)  Chronic, controlled  No current medications  Encouraged to limit intake of sugary foods and drinks  Encouraged to increase physical activity to 150 minutes per week as tolerated  3. ESRD (end stage renal disease) on dialysis ([place])  Goes to dialysis MWF  4. Chronic congestive heart failure, unspecified heart failure type ([place])  Stable,  Continue with follow up with cardiology  5. Angina pectoris ([place])  Stable  Continue follow up with cardiology  No recent episodes of chest pain  6. Rash  Darkened pigmentation and vesicles to fac  Will have her ry the benzoyl peroxide  If not better will consider referring dermatology however with her current renal status this often causes skin changes - benzoyl peroxide (BENZOYL PEROXIDE) 5 % external liquid; Apply topically 2 (two) times daily.  Dispense: 142 g; Refill: 12  7. Right arm pain  Intermittent right arm pain  Since on dialysis will try her on pain cream - diclofenac sodium (VOLTAREN) 1 % GEL; Apply 2 g topically 4 (four) times daily.  Dispense: 1 Tube; Refill: 3  8. Mixed hyperlipidemia  Chronic, stable.  Continue with current medications     [name], FNP

## 2018-11-26 ENCOUNTER — Encounter: Payer: Self-pay | Admitting: Cardiology

## 2018-11-26 ENCOUNTER — Other Ambulatory Visit: Payer: Self-pay

## 2018-11-26 DIAGNOSIS — E785 Hyperlipidemia, unspecified: Secondary | ICD-10-CM

## 2018-11-26 MED ORDER — EZETIMIBE 10 MG PO TABS: 10.0000 mg | ORAL_TABLET | Freq: Every day | ORAL | 1 refills | Status: DC

## 2018-11-27 ENCOUNTER — Other Ambulatory Visit: Payer: Self-pay | Admitting: Cardiology

## 2018-12-01 ENCOUNTER — Encounter: Payer: Self-pay | Admitting: Nurse Practitioner

## 2018-12-25 ENCOUNTER — Encounter: Payer: Self-pay | Admitting: Cardiology

## 2018-12-25 ENCOUNTER — Ambulatory Visit: Payer: Medicare Other | Admitting: Cardiology

## 2018-12-25 VITALS — BP 116/60 | HR 57 | Ht 62.6 in | Wt 244.0 lb

## 2018-12-25 DIAGNOSIS — I2510 Atherosclerotic heart disease of native coronary artery without angina pectoris: Secondary | ICD-10-CM

## 2018-12-25 DIAGNOSIS — E785 Hyperlipidemia, unspecified: Secondary | ICD-10-CM

## 2018-12-25 DIAGNOSIS — R001 Bradycardia, unspecified: Secondary | ICD-10-CM

## 2018-12-25 DIAGNOSIS — I10 Essential (primary) hypertension: Secondary | ICD-10-CM

## 2018-12-25 DIAGNOSIS — Z9861 Coronary angioplasty status: Secondary | ICD-10-CM

## 2018-12-25 NOTE — Progress Notes (Signed)
Cardiology Office Note:    Date:  12/25/2018   ID:  Jacqueline [name], DOB Apr 03, 1946, MRN [id number]  PCP:  Glendale Chard, MD  Cardiologist:  No primary care provider on file.    Referring MD: Glendale Chard, MD   Chief Complaint  Patient presents with  . Coronary Artery Disease  . Hypertension  . Hyperlipidemia    History of Present Illness:    Jacqueline [name] is a 73 y.o. female with a hx of CAD status post NSTEMI 12/2016 treated with DES to the RCA.  Post MI she had trouble with hypotension on HD and labetalol was stopped.  She also has hypertension, ESRD on HD, dyslipidemia, obesity hypoventilation syndrome.   She is here today for followup and is doing well.  She denies any chest pain or pressure, SOB, DOE, PND, orthopnea, LE edema, dizziness, palpitations or syncope. She is compliant with her meds and is tolerating meds with no SE.      Past Medical History:  Diagnosis Date  . Arthritis    "left knee" (12/04/2016)  . CAD (coronary artery disease)    a. 12/2016 NSTEMI/PCI: LM nl, LaD 20p/d, D1 20, OM2 20, OM3 20, RCA 31m (3.0x18 Resolute DES). EF 65%.  . Chronic lower back pain   . Diverticulosis    on CT  . ESRD on dialysis [facility])    "[facility]; [place]; TTS" (12/04/2016)  . GERD (gastroesophageal reflux disease)   . Hiatal hernia   . Hyperlipidemia   . Hypertension    a. 12/2016 labetalol d/c'd 2/2 hypotension in HD.  . Morbid obesity ([place])   . Obesity   . Pneumonia    "couple times" (12/04/2016)  . Renal insufficiency   . Type II diabetes mellitus ([place])     Past Surgical History:  Procedure Laterality Date  . ABDOMINAL HYSTERECTOMY    . AV FISTULA PLACEMENT Left 01/27/2014   Procedure: ARTERIOVENOUS (AV) FISTULA CREATION;  Surgeon: Mal Misty, MD;  Location: [place];  Service: Vascular;  Laterality: Left;  . AV FISTULA REPAIR     "had it cleaned out"  . CARDIAC CATHETERIZATION  ?1980s  . CARDIAC CATHETERIZATION N/A 12/04/2016   Procedure: Left  Heart Cath Angiography;  Surgeon: Burnell Blanks, MD;  Location: [place] CV LAB;  Service: Cardiovascular;  Laterality: N/A;  . CARDIAC CATHETERIZATION N/A 12/04/2016   Procedure: Coronary Stent Intervention;  Surgeon: Burnell Blanks, MD;  Location: [employer] CV LAB;  Service: Cardiovascular;  Laterality: N/A;  Mid RCA  . CATARACT EXTRACTION W/ INTRAOCULAR LENS IMPLANT Left   . COLONOSCOPY W/ BIOPSIES AND POLYPECTOMY    . CORONARY ANGIOPLASTY WITH STENT PLACEMENT  12/04/2016  . ESOPHAGOGASTRODUODENOSCOPY    . INSERTION OF DIALYSIS CATHETER N/A 01/19/2014   Procedure: INSERTION OF DIALYSIS CATHETER;  Surgeon: Rosetta Posner, MD;  [facility];  Service: Vascular;  Laterality: N/A;    Current Medications: Current Meds  Medication Sig  . aspirin EC 81 MG EC tablet Take 1 tablet (81 mg total) by mouth daily.  . B Complex-C-Folic Acid (DIALYVITE 008) 0.8 MG TABS Take 1 tablet by mouth daily.  . benzoyl peroxide (BENZOYL PEROXIDE) 5 % external liquid Apply topically 2 (two) times daily.  . calcium acetate (PHOSLO) 667 MG capsule Take 667 mg by mouth See admin instructions. Take 667 mg by mouth 3 times daily with meals and take 667 mg by mouth with snacks.  . ezetimibe (ZETIA) 10 MG tablet Take  1 tablet (10 mg total) by mouth daily.  . metoCLOPramide (REGLAN) 10 MG tablet TAKE 1 TABLET BY ORAL ROUTE 2 TIMES EVERY DAY 30 MINUTES BEFORE MEALS AND AT BEDTIME  . midodrine (PROAMATINE) 10 MG tablet Take 10 mg by mouth 3 (three) times a week. Dialysis days  . nitroGLYCERIN (NITROSTAT) 0.4 MG SL tablet Place 1 tablet (0.4 mg total) under the tongue every 5 (five) minutes as needed for chest pain.  . Omega-3 Fatty Acids (FISH OIL) 1000 MG CPDR Take 1 capsule by mouth daily.   Marland Kitchen omeprazole (PRILOSEC) 20 MG capsule Take 20 mg by mouth daily.  . ondansetron (ZOFRAN-ODT) 4 MG disintegrating tablet Take 4 mg by mouth every 4 (four) hours as needed for nausea or vomiting.  . rosuvastatin  (CRESTOR) 40 MG tablet TAKE 1 TABLET BY MOUTH DAILY     Allergies:   Sulfa drugs cross reactors   Social History   Socioeconomic History  . Marital status: Married    Spouse name: Not on file  . Number of children: 4  . Years of education: Not on file  . Highest education level: Not on file  Occupational History  . Not on file  Social Needs  . Financial resource strain: Not on file  . Food insecurity:    Worry: Not on file    Inability: Not on file  . Transportation needs:    Medical: Not on file    Non-medical: Not on file  Tobacco Use  . Smoking status: Former Smoker    Packs/day: 1.00    Years: 20.00    Pack years: 20.00    Types: Cigarettes    Last attempt to quit: 1985    Years since quitting: 35.0  . Smokeless tobacco: Never Used  Substance and Sexual Activity  . Alcohol use: No    Alcohol/week: 0.0 standard drinks  . Drug use: No  . Sexual activity: Not Currently  Lifestyle  . Physical activity:    Days per week: Not on file    Minutes per session: Not on file  . Stress: Not on file  Relationships  . Social connections:    Talks on phone: Not on file    Gets together: Not on file    Attends religious service: Not on file    Active member of club or organization: Not on file    Attends meetings of clubs or organizations: Not on file    Relationship status: Not on file  Other Topics Concern  . Not on file  Social History Narrative  . Not on file     Family History: The patient's family history includes Breast cancer in her sister; CAD in her father and sister; Cancer in her brother; Diabetes in her mother; Heart attack in her father; Heart disease in her father; Hypertension in her father; Peripheral vascular disease in her mother.  ROS:   Please see the history of present illness.    ROS  All other systems reviewed and negative.   EKGs/Labs/Other Studies Reviewed:    The following studies were reviewed today: none  EKG:  EKG is not ordered  today.    Recent Labs: No results found for requested labs within last 8760 hours.   Recent Lipid Panel    Component Value Date/Time   CHOL 133 04/14/2017 0840   TRIG 66 04/14/2017 0840   HDL 59 04/14/2017 0840   CHOLHDL 2.3 04/14/2017 0840   CHOLHDL 3.2 12/04/2016 0528   VLDL 9  12/04/2016 0528   LDLCALC 61 04/14/2017 0840    Physical Exam:    VS:  BP 116/60   Pulse (!) 57   Ht 5' 2.6" (1.59 m)   Wt 244 lb (110.7 kg)   SpO2 99%   BMI 43.78 kg/m     Wt Readings from Last 3 Encounters:  12/25/18 244 lb (110.7 kg)  11/20/18 241 lb 9.6 oz (109.6 kg)  07/22/18 240 lb 4 oz (109 kg)     GEN:  Well nourished, well developed in no acute distress HEENT: Normal NECK: No JVD; No carotid bruits LYMPHATICS: No lymphadenopathy CARDIAC: RRR, no murmurs, rubs, gallops RESPIRATORY:  Clear to auscultation without rales, wheezing or rhonchi  ABDOMEN: Soft, non-tender, non-distended MUSCULOSKELETAL:  No edema; No deformity  SKIN: Warm and dry NEUROLOGIC:  Alert and oriented x 3 PSYCHIATRIC:  Normal affect   ASSESSMENT:    1. CAD S/P percutaneous coronary angioplasty   2. Essential hypertension   3. Dyslipidemia   4. Bradycardia    PLAN:    In order of problems listed above:  1.  ASCAD - status post NSTEMI 12/2016 treated with DES to the RCA.  She denies any anginal symptoms.  She will continue on aspirin 81 mg daily, statin therapy.  She is not on beta-blockers due to issues with hypotension during dialysis as well as bradycardia.  2.  HTN -BP is well controlled on exam today.  She currently on no antihypertensive medication.  3.  Hyperlipidemia- LDL goal is less than 70.  Her LDL was 65 on 07/22/2018.  She will continue on Zetia 10 mg daily and Crestor 40 mg daily.  4.  Asymptomatic bradycardia -she is not on any rate slowing agents.  Medication Adjustments/Labs and Tests Ordered: Current medicines are reviewed at length with the patient today.  Concerns regarding medicines  are outlined above.  No orders of the defined types were placed in this encounter.  No orders of the defined types were placed in this encounter.   Signed, Fransico Him, MD  12/25/2018 8:35 AM    [place]

## 2018-12-25 NOTE — Patient Instructions (Signed)
Medication Instructions:  Your physician recommends that you continue on your current medications as directed. Please refer to the Current Medication list given to you today.  If you need a refill on your cardiac medications before your next appointment, please call your pharmacy.   Lab work: None If you have labs (blood work) drawn today and your tests are completely normal, you will receive your results only by: . MyChart Message (if you have MyChart) OR . A paper copy in the mail If you have any lab test that is abnormal or we need to change your treatment, we will call you to review the results.  Testing/Procedures: None  Follow-Up: At [facility], you and your health needs are our priority.  As part of our continuing mission to provide you with exceptional heart care, we have created designated Provider Care Teams.  These Care Teams include your primary Cardiologist (physician) and Advanced Practice Providers (APPs -  Physician Assistants and Nurse Practitioners) who all work together to provide you with the care you need, when you need it. You will need a follow up appointment in 1 years.  Please call our office 2 months in advance to schedule this appointment.  You may see Dr. Turner or one of the following Advanced Practice Providers on your designated Care Team:   Brittainy Simmons, PA-C Dayna Dunn, PA-C . Michele Lenze, PA-C     

## 2019-01-08 ENCOUNTER — Other Ambulatory Visit: Payer: Self-pay | Admitting: Nurse Practitioner

## 2019-02-10 LAB — HM DIABETES EYE EXAM

## 2019-03-15 ENCOUNTER — Encounter: Payer: Self-pay | Admitting: Nurse Practitioner

## 2019-03-18 ENCOUNTER — Other Ambulatory Visit: Payer: Self-pay | Admitting: Nurse Practitioner

## 2019-03-22 ENCOUNTER — Ambulatory Visit: Payer: Medicare Other | Admitting: Nurse Practitioner

## 2019-03-22 ENCOUNTER — Other Ambulatory Visit: Payer: Self-pay

## 2019-03-22 ENCOUNTER — Encounter: Payer: Self-pay | Admitting: Nurse Practitioner

## 2019-03-22 VITALS — BP 122/68 | HR 93 | Temp 98.6°F | Ht 62.6 in | Wt 249.2 lb

## 2019-03-22 DIAGNOSIS — I120 Hypertensive chronic kidney disease with stage 5 chronic kidney disease or end stage renal disease: Secondary | ICD-10-CM | POA: Diagnosis not present

## 2019-03-22 DIAGNOSIS — R809 Proteinuria, unspecified: Secondary | ICD-10-CM

## 2019-03-22 DIAGNOSIS — E785 Hyperlipidemia, unspecified: Secondary | ICD-10-CM | POA: Insufficient documentation

## 2019-03-22 DIAGNOSIS — N186 End stage renal disease: Secondary | ICD-10-CM | POA: Diagnosis not present

## 2019-03-22 DIAGNOSIS — H9203 Otalgia, bilateral: Secondary | ICD-10-CM | POA: Diagnosis not present

## 2019-03-22 DIAGNOSIS — E782 Mixed hyperlipidemia: Secondary | ICD-10-CM | POA: Diagnosis not present

## 2019-03-22 DIAGNOSIS — I10 Essential (primary) hypertension: Secondary | ICD-10-CM

## 2019-03-22 DIAGNOSIS — M79601 Pain in right arm: Secondary | ICD-10-CM

## 2019-03-22 DIAGNOSIS — E1129 Type 2 diabetes mellitus with other diabetic kidney complication: Secondary | ICD-10-CM

## 2019-03-22 DIAGNOSIS — Z992 Dependence on renal dialysis: Secondary | ICD-10-CM

## 2019-03-22 MED ORDER — DICLOFENAC SODIUM 1 % TD GEL: 2.0000 g | Freq: Four times a day (QID) | TRANSDERMAL | 2 refills | Status: DC

## 2019-03-22 NOTE — Progress Notes (Signed)
Subjective:     Patient ID: Jacqueline [name] , female    DOB: 1946/03/26 , 73 y.o.   MRN: [id number]   Chief Complaint  Patient presents with  . Hyperlipidemia    HPI  Continues to go to Dialysis TTh and Sat - increased her dry weight to 112 lbs she thinks was 110 lbs. She is drinking sodas too.    Hyperlipidemia  This is a chronic problem. The current episode started more than 1 year ago. The problem is controlled. Recent lipid tests were reviewed and are high. There are no known factors aggravating her hyperlipidemia. Pertinent negatives include no chest pain. Current antihyperlipidemic treatment includes ezetimibe and statins. The current treatment provides moderate improvement of lipids. There are no compliance problems.   Otalgia   There is pain in both ears. This is a chronic problem. The current episode started more than 1 month ago. The problem occurs constantly. There has been no fever. Pertinent negatives include no abdominal pain, coughing or ear discharge. She has tried ear drops for the symptoms. The treatment provided mild relief. There is no history of a chronic ear infection or hearing loss.     Past Medical History:  Diagnosis Date  . Arthritis    "left knee" (12/04/2016)  . CAD (coronary artery disease)    a. 12/2016 NSTEMI/PCI: LM nl, LaD 20p/d, D1 20, OM2 20, OM3 20, RCA 25m (3.0x18 Resolute DES). EF 65%.  . Chronic lower back pain   . Diverticulosis    on CT  . ESRD on dialysis [facility])    "[facility]; [place]; TTS" (12/04/2016)  . GERD (gastroesophageal reflux disease)   . Hiatal hernia   . Hyperlipidemia   . Hypertension    a. 12/2016 labetalol d/c'd 2/2 hypotension in HD.  . Morbid obesity ([place])   . Obesity   . Pneumonia    "couple times" (12/04/2016)  . Renal insufficiency   . Type II diabetes mellitus (HCC)      Family History  Problem Relation Age of Onset  . Diabetes Mother   . Peripheral vascular disease Mother        amputation  . CAD  Father   . Heart disease Father   . Hypertension Father   . Heart attack Father   . CAD Sister   . Cancer Brother        x 2 brothers, type unknown  . Breast cancer Sister      Current Outpatient Medications:  .  aspirin EC 81 MG EC tablet, Take 1 tablet (81 mg total) by mouth daily., Disp: , Rfl:  .  B Complex-C-Folic Acid (DIALYVITE 970) 0.8 MG TABS, Take 1 tablet by mouth daily., Disp: , Rfl:  .  benzoyl peroxide (BENZOYL PEROXIDE) 5 % external liquid, Apply topically 2 (two) times daily., Disp: 142 g, Rfl: 12 .  calcium acetate (PHOSLO) 667 MG capsule, Take 667 mg by mouth See admin instructions. Take 667 mg by mouth 3 times daily with meals and take 667 mg by mouth with snacks., Disp: , Rfl: 10 .  metoCLOPramide (REGLAN) 10 MG tablet, TAKE 1 TABLET BY ORAL ROUTE 2 TIMES EVERY DAY 30 MINUTES BEFORE MEALS AND AT BEDTIME, Disp: 60 tablet, Rfl: 1 .  midodrine (PROAMATINE) 10 MG tablet, Take 10 mg by mouth 3 (three) times a week. Dialysis days, Disp: , Rfl:  .  Omega-3 Fatty Acids (FISH OIL) 1000 MG CPDR, Take 1 capsule by mouth daily. , Disp: ,  Rfl:  .  omeprazole (PRILOSEC) 20 MG capsule, TAKE ONE CAPSULE EVERY DAY BEFORE MEALS, Disp: 90 capsule, Rfl: 1 .  ondansetron (ZOFRAN-ODT) 4 MG disintegrating tablet, Take 4 mg by mouth every 4 (four) hours as needed for nausea or vomiting., Disp: , Rfl:  .  rosuvastatin (CRESTOR) 40 MG tablet, TAKE 1 TABLET BY MOUTH DAILY, Disp: 90 tablet, Rfl: 1 .  ezetimibe (ZETIA) 10 MG tablet, Take 1 tablet (10 mg total) by mouth daily., Disp: 90 tablet, Rfl: 1 .  nitroGLYCERIN (NITROSTAT) 0.4 MG SL tablet, Place 1 tablet (0.4 mg total) under the tongue every 5 (five) minutes as needed for chest pain., Disp: 25 tablet, Rfl: 3   Allergies  Allergen Reactions  . Sulfa Drugs Cross Reactors Other (See Comments)    Doesn't remember      Review of Systems  HENT: Positive for ear pain. Negative for ear discharge.   Respiratory: Negative for cough.    Cardiovascular: Negative for chest pain.  Gastrointestinal: Negative for abdominal pain.     Today's Vitals   03/22/19 0926  BP: 122/68  Pulse: 93  Temp: 98.6 F (37 C)  TempSrc: Oral  SpO2: 97%  Weight: 249 lb 3.2 oz (113 kg)  Height: 5' 2.6" (1.59 m)   Body mass index is 44.71 kg/m.   Objective:  Physical Exam Constitutional:      Appearance: Normal appearance.  HENT:     Head: Normocephalic and atraumatic.     Right Ear: Tympanic membrane, ear canal and external ear normal. There is no impacted cerumen.     Left Ear: Tympanic membrane, ear canal and external ear normal. There is no impacted cerumen.  Cardiovascular:     Rate and Rhythm: Normal rate and regular rhythm.     Pulses: Normal pulses.     Heart sounds: Normal heart sounds. No murmur.     Comments: Left fistula with thrill and bruit present Pulmonary:     Effort: Pulmonary effort is normal. No respiratory distress.     Breath sounds: Normal breath sounds.  Skin:    General: Skin is warm and dry.     Capillary Refill: Capillary refill takes less than 2 seconds.  Neurological:     General: No focal deficit present.     Mental Status: She is alert and oriented to person, place, and time.  Psychiatric:        Mood and Affect: Mood normal.        Behavior: Behavior normal.        Thought Content: Thought content normal.        Judgment: Judgment normal.         Assessment And Plan:     1. Essential hypertension . B/P is controlled while on dialysis, she is not on any medications.  . CMP ordered to check renal function.  - CMP14 + Anion Gap  2. Type 2 diabetes mellitus with microalbuminuria, without long-term current use of insulin (HCC)  Chronic, controlled  No current medications  Encouraged to limit intake of sugary foods and drinks, advised to avoid drinking sodas - Hemoglobin A1c - CMP14 + Anion Gap  3. ESRD (end stage renal disease) on dialysis [facility])  Continue with dialysis TTh and Sat  reports tolerating well - CMP14 + Anion Gap - CBC no Diff  4. Mixed hyperlipidemia  Chronic, controlled  Continue with current medications - Lipid Profile - CMP14 + Anion Gap  5. Otalgia of both ears  No  abnormal findings   Continue with mineral oil as needed  Offered to refer to ENT declined at this time  6. Right arm pain  Soreness, ROM slightly limited  Will treat with voltaren gel due to ESRD - diclofenac sodium (VOLTAREN) 1 % GEL; Apply 2 g topically 4 (four) times daily.  Dispense: 100 g; Refill: [place], FNP   COVID-19 Education: The signs and symptoms of COVID-19 were discussed with the patient and how to seek care for testing (follow up with PCP or arrange E-visit).  The importance of social distancing was discussed today. I have also stressed the importance of her to wear a mask at ALL times when out.     Patient Risk:   After full review of this patients clinical status, I feel that they are at least high risk at this time.   THE PATIENT IS ENCOURAGED TO PRACTICE SOCIAL DISTANCING DUE TO THE COVID-19 PANDEMIC.

## 2019-03-23 LAB — CMP14 + ANION GAP
ALT: 28 IU/L (ref 0–32)
AST: 30 IU/L (ref 0–40)
Albumin/Globulin Ratio: 1.7 (ref 1.2–2.2)
Albumin: 4.3 g/dL (ref 3.7–4.7)
Alkaline Phosphatase: 82 IU/L (ref 39–117)
Anion Gap: 24 mmol/L — ABNORMAL HIGH (ref 10.0–18.0)
BUN/Creatinine Ratio: 3 — ABNORMAL LOW (ref 12–28)
BUN: 34 mg/dL — ABNORMAL HIGH (ref 8–27)
Bilirubin Total: 0.4 mg/dL (ref 0.0–1.2)
CO2: 25 mmol/L (ref 20–29)
Calcium: 9.3 mg/dL (ref 8.7–10.3)
Chloride: 93 mmol/L — ABNORMAL LOW (ref 96–106)
Creatinine, Ser: 9.86 mg/dL — ABNORMAL HIGH (ref 0.57–1.00)
GFR calc Af Amer: 4 mL/min/1.73 — ABNORMAL LOW
GFR calc non Af Amer: 3 mL/min/1.73 — ABNORMAL LOW
Globulin, Total: 2.6 g/dL (ref 1.5–4.5)
Glucose: 140 mg/dL — ABNORMAL HIGH (ref 65–99)
Potassium: 3.5 mmol/L (ref 3.5–5.2)
Sodium: 142 mmol/L (ref 134–144)
Total Protein: 6.9 g/dL (ref 6.0–8.5)

## 2019-03-23 LAB — LIPID PANEL
Chol/HDL Ratio: 2.4 ratio (ref 0.0–4.4)
Cholesterol, Total: 129 mg/dL (ref 100–199)
HDL: 53 mg/dL
LDL Calculated: 60 mg/dL (ref 0–99)
Triglycerides: 80 mg/dL (ref 0–149)
VLDL Cholesterol Cal: 16 mg/dL (ref 5–40)

## 2019-03-23 LAB — CBC
Hematocrit: 40.6 % (ref 34.0–46.6)
Hemoglobin: 12.6 g/dL (ref 11.1–15.9)
MCH: 30.7 pg (ref 26.6–33.0)
MCHC: 31 g/dL — ABNORMAL LOW (ref 31.5–35.7)
MCV: 99 fL — ABNORMAL HIGH (ref 79–97)
Platelets: 188 x10E3/uL (ref 150–450)
RBC: 4.11 x10E6/uL (ref 3.77–5.28)
RDW: 13 % (ref 11.7–15.4)
WBC: 8.9 x10E3/uL (ref 3.4–10.8)

## 2019-03-23 LAB — HEMOGLOBIN A1C
Est. average glucose Bld gHb Est-mCnc: 140 mg/dL
Hgb A1c MFr Bld: 6.5 % — ABNORMAL HIGH (ref 4.8–5.6)

## 2019-03-25 ENCOUNTER — Telehealth: Payer: Self-pay

## 2019-03-25 NOTE — Telephone Encounter (Signed)
1st attempt to give results. No answer

## 2019-03-25 NOTE — Telephone Encounter (Signed)
Patient notified of lab results [facility]

## 2019-03-25 NOTE — Telephone Encounter (Signed)
-----   Message from [name], [place] sent at 03/24/2019  5:38 PM EDT ----- HgbA1c is 6.5 goal is less than 5.6 limit your intake of sugary food and drinks. Cholesterol levels are normal. Your kidney functions are slightly worse. Liver functions are normal.  Your blood levels are essentially normal.

## 2019-05-12 ENCOUNTER — Ambulatory Visit (INDEPENDENT_AMBULATORY_CARE_PROVIDER_SITE_OTHER): Payer: Medicare Other

## 2019-05-12 ENCOUNTER — Other Ambulatory Visit: Payer: Self-pay

## 2019-05-12 VITALS — Ht 64.0 in | Wt 229.0 lb

## 2019-05-12 DIAGNOSIS — Z0000 Encounter for general adult medical examination without abnormal findings: Secondary | ICD-10-CM | POA: Diagnosis not present

## 2019-05-12 NOTE — Patient Instructions (Signed)
Jacqueline [name] , Thank you for taking time to come for your Medicare Wellness Visit. I appreciate your ongoing commitment to your health goals. Please review the following plan we discussed and let me know if I can assist you in the future.   Screening recommendations/referrals: Colonoscopy: patient does not remember Mammogram: 09/2018 Bone Density: 07/2017 Recommended yearly ophthalmology/optometry visit for glaucoma screening and checkup Recommended yearly dental visit for hygiene and checkup  Vaccinations: Influenza vaccine: 09/2018 Pneumococcal vaccine: 04/2015 Tdap vaccine: 06/2013 Shingles vaccine: discussed    Advanced directives: Advance directive discussed with you today.  Conditions/risks identified: obesity  Next appointment: 07/28/2019 at 10:00   Preventive Care 24 Years and Older, Female Preventive care refers to lifestyle choices and visits with your health care provider that can promote health and wellness. What does preventive care include?  A yearly physical exam. This is also called an annual well check.  Dental exams once or twice a year.  Routine eye exams. Ask your health care provider how often you should have your eyes checked.  Personal lifestyle choices, including:  Daily care of your teeth and gums.  Regular physical activity.  Eating a healthy diet.  Avoiding tobacco and drug use.  Limiting alcohol use.  Practicing safe sex.  Taking low-dose aspirin every day.  Taking vitamin and mineral supplements as recommended by your health care provider. What happens during an annual well check? The services and screenings done by your health care provider during your annual well check will depend on your age, overall health, lifestyle risk factors, and family history of disease. Counseling  Your health care provider may ask you questions about your:  Alcohol use.  Tobacco use.  Drug use.  Emotional well-being.  Home and relationship  well-being.  Sexual activity.  Eating habits.  History of falls.  Memory and ability to understand (cognition).  Work and work Statistician.  Reproductive health. Screening  You may have the following tests or measurements:  Height, weight, and BMI.  Blood pressure.  Lipid and cholesterol levels. These may be checked every 5 years, or more frequently if you are over 71 years old.  Skin check.  Lung cancer screening. You may have this screening every year starting at age 70 if you have a 30-pack-year history of smoking and currently smoke or have quit within the past 15 years.  Fecal occult blood test (FOBT) of the stool. You may have this test every year starting at age 50.  Flexible sigmoidoscopy or colonoscopy. You may have a sigmoidoscopy every 5 years or a colonoscopy every 10 years starting at age 36.  Hepatitis C blood test.  Hepatitis B blood test.  Sexually transmitted disease (STD) testing.  Diabetes screening. This is done by checking your blood sugar (glucose) after you have not eaten for a while (fasting). You may have this done every 1-3 years.  Bone density scan. This is done to screen for osteoporosis. You may have this done starting at age 39.  Mammogram. This may be done every 1-2 years. Talk to your health care provider about how often you should have regular mammograms. Talk with your health care provider about your test results, treatment options, and if necessary, the need for more tests. Vaccines  Your health care provider may recommend certain vaccines, such as:  Influenza vaccine. This is recommended every year.  Tetanus, diphtheria, and acellular pertussis (Tdap, Td) vaccine. You may need a Td booster every 10 years.  Zoster vaccine. You may need this  after age 18.  Pneumococcal 13-valent conjugate (PCV13) vaccine. One dose is recommended after age 74.  Pneumococcal polysaccharide (PPSV23) vaccine. One dose is recommended after age 47.  Talk to your health care provider about which screenings and vaccines you need and how often you need them. This information is not intended to replace advice given to you by your health care provider. Make sure you discuss any questions you have with your health care provider. Document Released: 12/15/2015 Document Revised: 08/07/2016 Document Reviewed: 09/19/2015 Elsevier Interactive Patient Education  2017 [place] Prevention in the Home Falls can cause injuries. They can happen to people of all ages. There are many things you can do to make your home safe and to help prevent falls. What can I do on the outside of my home?  Regularly fix the edges of walkways and driveways and fix any cracks.  Remove anything that might make you trip as you walk through a door, such as a raised step or threshold.  Trim any bushes or trees on the path to your home.  Use bright outdoor lighting.  Clear any walking paths of anything that might make someone trip, such as rocks or tools.  Regularly check to see if handrails are loose or broken. Make sure that both sides of any steps have handrails.  Any raised decks and porches should have guardrails on the edges.  Have any leaves, snow, or ice cleared regularly.  Use sand or salt on walking paths during winter.  Clean up any spills in your garage right away. This includes oil or grease spills. What can I do in the bathroom?  Use night lights.  Install grab bars by the toilet and in the tub and shower. Do not use towel bars as grab bars.  Use non-skid mats or decals in the tub or shower.  If you need to sit down in the shower, use a plastic, non-slip stool.  Keep the floor dry. Clean up any water that spills on the floor as soon as it happens.  Remove soap buildup in the tub or shower regularly.  Attach bath mats securely with double-sided non-slip rug tape.  Do not have throw rugs and other things on the floor that can make you  trip. What can I do in the bedroom?  Use night lights.  Make sure that you have a light by your bed that is easy to reach.  Do not use any sheets or blankets that are too big for your bed. They should not hang down onto the floor.  Have a firm chair that has side arms. You can use this for support while you get dressed.  Do not have throw rugs and other things on the floor that can make you trip. What can I do in the kitchen?  Clean up any spills right away.  Avoid walking on wet floors.  Keep items that you use a lot in easy-to-reach places.  If you need to reach something above you, use a strong step stool that has a grab bar.  Keep electrical cords out of the way.  Do not use floor polish or wax that makes floors slippery. If you must use wax, use non-skid floor wax.  Do not have throw rugs and other things on the floor that can make you trip. What can I do with my stairs?  Do not leave any items on the stairs.  Make sure that there are handrails on both sides of the  stairs and use them. Fix handrails that are broken or loose. Make sure that handrails are as long as the stairways.  Check any carpeting to make sure that it is firmly attached to the stairs. Fix any carpet that is loose or worn.  Avoid having throw rugs at the top or bottom of the stairs. If you do have throw rugs, attach them to the floor with carpet tape.  Make sure that you have a light switch at the top of the stairs and the bottom of the stairs. If you do not have them, ask someone to add them for you. What else can I do to help prevent falls?  Wear shoes that:  Do not have high heels.  Have rubber bottoms.  Are comfortable and fit you well.  Are closed at the toe. Do not wear sandals.  If you use a stepladder:  Make sure that it is fully opened. Do not climb a closed stepladder.  Make sure that both sides of the stepladder are locked into place.  Ask someone to hold it for you, if  possible.  Clearly mark and make sure that you can see:  Any grab bars or handrails.  First and last steps.  Where the edge of each step is.  Use tools that help you move around (mobility aids) if they are needed. These include:  Canes.  Walkers.  Scooters.  Crutches.  Turn on the lights when you go into a dark area. Replace any light bulbs as soon as they burn out.  Set up your furniture so you have a clear path. Avoid moving your furniture around.  If any of your floors are uneven, fix them.  If there are any pets around you, be aware of where they are.  Review your medicines with your doctor. Some medicines can make you feel dizzy. This can increase your chance of falling. Ask your doctor what other things that you can do to help prevent falls. This information is not intended to replace advice given to you by your health care provider. Make sure you discuss any questions you have with your health care provider. Document Released: 09/14/2009 Document Revised: 04/25/2016 Document Reviewed: 12/23/2014 Elsevier Interactive Patient Education  2017 [employer].

## 2019-05-12 NOTE — Progress Notes (Signed)
Subjective:   Jacqueline [name] is a 73 y.o. female who presents for Medicare Annual (Subsequent) preventive examination.  This visit type was conducted due to national recommendations for restrictions regarding the COVID-19 Pandemic (e.g. social distancing). This format is felt to be most appropriate for this patient at this time. All issues noted in this document were discussed and addressed. No physical exam was performed (except for noted visual exam findings with Video Visits). This patient, [name], has given permission to perform this visit via telephone. Vital signs may be absent or patient reported.  Patient location:  At home  Nurse location:  [place] office     Review of Systems:  n/a Cardiac Risk Factors include: obesity (BMI >30kg/m2);hypertension;dyslipidemia     Objective:     Vitals: Ht 5\' 4"  (1.626 m) Comment: per patient  Wt 229 lb (103.9 kg) Comment: per patient  BMI 39.31 kg/m   Body mass index is 39.31 kg/m.  Advanced Directives 05/12/2019 07/23/2017 07/16/2017 12/04/2016 12/03/2016 03/15/2014 01/15/2014  Does Patient Have a Medical Advance Directive? No No No No No Patient would not like information Patient does not have advance directive  Would patient like information on creating a medical advance directive? - No - Patient declined - No - Patient declined - - -  Pre-existing out of facility DNR order (yellow form or pink MOST form) - - - - - - No    Tobacco Social History   Tobacco Use  Smoking Status Former Smoker  . Packs/day: 1.00  . Years: 20.00  . Pack years: 20.00  . Types: Cigarettes  . Last attempt to quit: 1985  . Years since quitting: 35.4  Smokeless Tobacco Never Used     Counseling given: Not Answered   Clinical Intake:  Pre-visit preparation completed: Yes  Pain : No/denies pain Pain Score: 0-No pain     Nutritional Status: BMI > 30  Obese Nutritional Risks: None Diabetes: Yes CBG done?: No Did pt. bring in CBG  monitor from home?: No  How often do you need to have someone help you when you read instructions, pamphlets, or other written materials from your doctor or pharmacy?: 1 - Never What is the last grade level you completed in school?: 12th grade  Interpreter Needed?: No  Information entered by :: NAllen LPN  Past Medical History:  Diagnosis Date  . Arthritis    "left knee" (12/04/2016)  . CAD (coronary artery disease)    a. 12/2016 NSTEMI/PCI: LM nl, LaD 20p/d, D1 20, OM2 20, OM3 20, RCA 2m (3.0x18 Resolute DES). EF 65%.  . Chronic lower back pain   . Diverticulosis    on CT  . ESRD on dialysis [facility])    "[facility]; [place]; TTS" (12/04/2016)  . GERD (gastroesophageal reflux disease)   . Hiatal hernia   . Hyperlipidemia   . Hypertension    a. 12/2016 labetalol d/c'd 2/2 hypotension in HD.  . Morbid obesity ([place])   . Obesity   . Pneumonia    "couple times" (12/04/2016)  . Renal insufficiency   . Type II diabetes mellitus ([place])    Past Surgical History:  Procedure Laterality Date  . ABDOMINAL HYSTERECTOMY    . AV FISTULA PLACEMENT Left 01/27/2014   Procedure: ARTERIOVENOUS (AV) FISTULA CREATION;  Surgeon: Mal Misty, MD;  Location: [facility];  Service: Vascular;  Laterality: Left;  . AV FISTULA REPAIR     "had it cleaned out"  . CARDIAC CATHETERIZATION  ?  1980s  . CARDIAC CATHETERIZATION N/A 12/04/2016   Procedure: Left Heart Cath Angiography;  Surgeon: Burnell Blanks, MD;  Location: [place] CV LAB;  Service: Cardiovascular;  Laterality: N/A;  . CARDIAC CATHETERIZATION N/A 12/04/2016   Procedure: Coronary Stent Intervention;  Surgeon: Burnell Blanks, MD;  Location: [facility];  Service: Cardiovascular;  Laterality: N/A;  Mid RCA  . CATARACT EXTRACTION W/ INTRAOCULAR LENS IMPLANT Left   . COLONOSCOPY W/ BIOPSIES AND POLYPECTOMY    . CORONARY ANGIOPLASTY WITH STENT PLACEMENT  12/04/2016  . ESOPHAGOGASTRODUODENOSCOPY    . INSERTION OF DIALYSIS  CATHETER N/A 01/19/2014   Procedure: INSERTION OF DIALYSIS CATHETER;  Surgeon: Rosetta Posner, MD;  Location: [facility] OR;  Service: Vascular;  Laterality: N/A;   Family History  Problem Relation Age of Onset  . Diabetes Mother   . Peripheral vascular disease Mother        amputation  . CAD Father   . Heart disease Father   . Hypertension Father   . Heart attack Father   . CAD Sister   . Cancer Brother        x 2 brothers, type unknown  . Breast cancer Sister    Social History   Socioeconomic History  . Marital status: Married    Spouse name: Not on file  . Number of children: 4  . Years of education: Not on file  . Highest education level: Not on file  Occupational History  . Occupation: retired  Scientific laboratory technician  . Financial resource strain: Not hard at all  . Food insecurity:    Worry: Never true    Inability: Never true  . Transportation needs:    Medical: No    Non-medical: No  Tobacco Use  . Smoking status: Former Smoker    Packs/day: 1.00    Years: 20.00    Pack years: 20.00    Types: Cigarettes    Last attempt to quit: 1985    Years since quitting: 35.4  . Smokeless tobacco: Never Used  Substance and Sexual Activity  . Alcohol use: No    Alcohol/week: 0.0 standard drinks  . Drug use: No  . Sexual activity: Not Currently  Lifestyle  . Physical activity:    Days per week: 7 days    Minutes per session: 10 min  . Stress: Not at all  Relationships  . Social connections:    Talks on phone: Not on file    Gets together: Not on file    Attends religious service: Not on file    Active member of club or organization: Not on file    Attends meetings of clubs or organizations: Not on file    Relationship status: Not on file  Other Topics Concern  . Not on file  Social History Narrative  . Not on file    Outpatient Encounter Medications as of 05/12/2019  Medication Sig  . aspirin EC 81 MG EC tablet Take 1 tablet (81 mg total) by mouth daily.  . B Complex-C-Folic  Acid (DIALYVITE 800) 0.8 MG TABS Take 1 tablet by mouth daily.  . benzoyl peroxide (BENZOYL PEROXIDE) 5 % external liquid Apply topically 2 (two) times daily.  . diclofenac sodium (VOLTAREN) 1 % GEL Apply 2 g topically 4 (four) times daily.  Marland Kitchen ezetimibe (ZETIA) 10 MG tablet Take 1 tablet (10 mg total) by mouth daily.  . metoCLOPramide (REGLAN) 10 MG tablet TAKE 1 TABLET BY ORAL ROUTE 2 TIMES EVERY DAY  30 MINUTES BEFORE MEALS AND AT BEDTIME  . midodrine (PROAMATINE) 10 MG tablet Take 10 mg by mouth 3 (three) times a week. Dialysis days  . Omega-3 Fatty Acids (FISH OIL) 1000 MG CPDR Take 1 capsule by mouth daily.   Marland Kitchen omeprazole (PRILOSEC) 20 MG capsule TAKE ONE CAPSULE EVERY DAY BEFORE MEALS  . ondansetron (ZOFRAN-ODT) 4 MG disintegrating tablet Take 4 mg by mouth every 4 (four) hours as needed for nausea or vomiting.  . rosuvastatin (CRESTOR) 40 MG tablet TAKE 1 TABLET BY MOUTH DAILY  . calcium acetate (PHOSLO) 667 MG capsule Take 667 mg by mouth See admin instructions. Take 667 mg by mouth 3 times daily with meals and take 667 mg by mouth with snacks.  . nitroGLYCERIN (NITROSTAT) 0.4 MG SL tablet Place 1 tablet (0.4 mg total) under the tongue every 5 (five) minutes as needed for chest pain.   No facility-administered encounter medications on file as of 05/12/2019.     Activities of Daily Living In your present state of health, do you have any difficulty performing the following activities: 05/12/2019  Hearing? N  Vision? N  Difficulty concentrating or making decisions? N  Walking or climbing stairs? N  Dressing or bathing? N  Doing errands, shopping? N  Preparing Food and eating ? N  Using the Toilet? N  In the past six months, have you accidently leaked urine? N  Do you have problems with loss of bowel control? N  Managing your Medications? N  Managing your Finances? N  Housekeeping or managing your Housekeeping? N  Some recent data might be hidden    Patient Care Team: Glendale Chard, MD as PCP - General (Internal Medicine)    Assessment:   This is a routine wellness examination for [facility].  Exercise Activities and Dietary recommendations Current Exercise Habits: Home exercise routine, Type of exercise: walking, Time (Minutes): 10, Frequency (Times/Week): 7, Weekly Exercise (Minutes/Week): 70  Goals    . Patient Stated     No goals       Fall Risk Fall Risk  05/12/2019 03/22/2019 11/20/2018  Falls in the past year? 0 0 0  Risk for fall due to : Medication side effect - -  Follow up Falls prevention discussed - -   Is the patient's home free of loose throw rugs in walkways, pet beds, electrical cords, etc?   yes      Grab bars in the bathroom? yes      Handrails on the stairs?   n/a      Adequate lighting?   yes  Timed Get Up and Go performed: n/a  Depression Screen PHQ 2/9 Scores 05/12/2019 03/22/2019 11/20/2018  PHQ - 2 Score 0 0 0  PHQ- 9 Score 0 - -     Cognitive Function     6CIT Screen 05/12/2019  What Year? 0 points  What month? 0 points  What time? 0 points  Count back from 20 0 points  Months in reverse 2 points  Repeat phrase 0 points  Total Score 2    Immunization History  Administered Date(s) Administered  . Influenza-Unspecified 09/30/2018    Qualifies for Shingles Vaccine? yes  Screening Tests Health Maintenance  Topic Date Due  . FOOT EXAM  12/10/1955  . URINE MICROALBUMIN  [birth year]10/1955  . COLONOSCOPY  12/10/1993  . PNA vac Low Risk Adult (2 of 2 - PPSV23) 10/17/2011  . INFLUENZA VACCINE  07/03/2019  . HEMOGLOBIN A1C  09/21/2019  . OPHTHALMOLOGY  EXAM  02/10/2020  . TETANUS/TDAP  06/21/2023  . DEXA SCAN  Completed  . Hepatitis C Screening  Completed    Cancer Screenings: Lung: Low Dose CT Chest recommended if Age 58-80 years, 30 pack-year currently smoking OR have quit w/in 15years. Patient does not qualify. Breast:  Up to date on Mammogram? Yes   Up to date of Bone Density/Dexa? Yes Colorectal: up to date per  patient  Additional Screenings: : Hepatitis C Screening: 03/21/2014     Plan:   No goals set. States she had colonoscopy within 10 years does not remember when. States she had  Prevnar 13, but does not remember when.  I have personally reviewed and noted the following in the patient's chart:   . Medical and social history . Use of alcohol, tobacco or illicit drugs  . Current medications and supplements . Functional ability and status . Nutritional status . Physical activity . Advanced directives . List of other physicians . Hospitalizations, surgeries, and ER visits in previous 12 months . Vitals . Screenings to include cognitive, depression, and falls . Referrals and appointments  In addition, I have reviewed and discussed with patient certain preventive protocols, quality metrics, and best practice recommendations. A written personalized care plan for preventive services as well as general preventive health recommendations were provided to patient.     Kellie Simmering, LPN  [id number]

## 2019-06-10 ENCOUNTER — Other Ambulatory Visit: Payer: Self-pay | Admitting: Cardiology

## 2019-06-10 DIAGNOSIS — E785 Hyperlipidemia, unspecified: Secondary | ICD-10-CM

## 2019-06-11 ENCOUNTER — Ambulatory Visit: Payer: Medicare Other | Admitting: Podiatry

## 2019-06-11 ENCOUNTER — Encounter: Payer: Self-pay | Admitting: Podiatry

## 2019-06-11 ENCOUNTER — Other Ambulatory Visit: Payer: Self-pay

## 2019-06-11 VITALS — Temp 96.9°F

## 2019-06-11 DIAGNOSIS — L84 Corns and callosities: Secondary | ICD-10-CM

## 2019-06-11 DIAGNOSIS — M79674 Pain in right toe(s): Secondary | ICD-10-CM

## 2019-06-11 DIAGNOSIS — E1159 Type 2 diabetes mellitus with other circulatory complications: Secondary | ICD-10-CM

## 2019-06-11 DIAGNOSIS — B351 Tinea unguium: Secondary | ICD-10-CM

## 2019-06-11 DIAGNOSIS — M79675 Pain in left toe(s): Secondary | ICD-10-CM | POA: Diagnosis not present

## 2019-06-11 NOTE — Progress Notes (Signed)
Complaint:  Visit Type: Patient returns to my office for continued preventative foot care services. Complaint: Patient states" my nails have grown long and thick and become painful to walk and wear shoes" Patient has been diagnosed with DM with angiopathy.  The patient presents for preventative foot care services. No changes to ROS.  Patient has not been seen in over 9 months.  Podiatric Exam: Vascular: dorsalis pedis and posterior tibial pulses are not  palpable bilateral. Capillary return is immediate. Temperature gradient is WNL. Skin turgor WNL  Sensorium: Normal Semmes Weinstein monofilament test. Normal tactile sensation bilaterally. Nail Exam: Pt has thick disfigured discolored nails with subungual debris noted bilateral entire nail hallux through fifth toenails Ulcer Exam: There is no evidence of ulcer or pre-ulcerative changes or infection. Orthopedic Exam: Muscle tone and strength are WNL. No limitations in general ROM. No crepitus or effusions noted. Foot type and digits show no abnormalities. Bony prominences are unremarkable. Skin: No Porokeratosis. No infection or ulcers.  Callus left hallux.  Diagnosis:  Onychomycosis, , Pain in right toe, pain in left toes Callus left hallux  Treatment & Plan Procedures and Treatment: Consent by patient was obtained for treatment procedures.   Debridement of mycotic and hypertrophic toenails, 1 through 5 bilateral and clearing of subungual debris. No ulceration, no infection noted.  Return Visit-Office Procedure: Patient instructed to return to the office for a follow up visit prn  for continued evaluation and treatment.    Gardiner Barefoot DPM

## 2019-06-22 ENCOUNTER — Encounter (HOSPITAL_COMMUNITY): Payer: Self-pay | Admitting: General Practice

## 2019-06-22 ENCOUNTER — Other Ambulatory Visit: Payer: Self-pay

## 2019-06-22 ENCOUNTER — Inpatient Hospital Stay (HOSPITAL_COMMUNITY)
Admission: EM | Admit: 2019-06-22 | Discharge: 2019-06-24 | DRG: 314 | Disposition: A | Discharge status: Home / Self Care | Payer: Medicare Other | Attending: Internal Medicine | Admitting: Internal Medicine

## 2019-06-22 ENCOUNTER — Emergency Department (HOSPITAL_COMMUNITY): Payer: Medicare Other

## 2019-06-22 DIAGNOSIS — I132 Hypertensive heart and chronic kidney disease with heart failure and with stage 5 chronic kidney disease, or end stage renal disease: Secondary | ICD-10-CM | POA: Diagnosis present

## 2019-06-22 DIAGNOSIS — Z961 Presence of intraocular lens: Secondary | ICD-10-CM | POA: Diagnosis present

## 2019-06-22 DIAGNOSIS — Z6841 Body Mass Index (BMI) 40.0 and over, adult: Secondary | ICD-10-CM

## 2019-06-22 DIAGNOSIS — Z833 Family history of diabetes mellitus: Secondary | ICD-10-CM | POA: Diagnosis not present

## 2019-06-22 DIAGNOSIS — Z992 Dependence on renal dialysis: Secondary | ICD-10-CM

## 2019-06-22 DIAGNOSIS — M1990 Unspecified osteoarthritis, unspecified site: Secondary | ICD-10-CM | POA: Diagnosis present

## 2019-06-22 DIAGNOSIS — Z882 Allergy status to sulfonamides status: Secondary | ICD-10-CM

## 2019-06-22 DIAGNOSIS — M545 Low back pain: Secondary | ICD-10-CM | POA: Diagnosis present

## 2019-06-22 DIAGNOSIS — Z87891 Personal history of nicotine dependence: Secondary | ICD-10-CM

## 2019-06-22 DIAGNOSIS — G8929 Other chronic pain: Secondary | ICD-10-CM | POA: Diagnosis present

## 2019-06-22 DIAGNOSIS — I5032 Chronic diastolic (congestive) heart failure: Secondary | ICD-10-CM | POA: Diagnosis present

## 2019-06-22 DIAGNOSIS — N2581 Secondary hyperparathyroidism of renal origin: Secondary | ICD-10-CM | POA: Diagnosis present

## 2019-06-22 DIAGNOSIS — K219 Gastro-esophageal reflux disease without esophagitis: Secondary | ICD-10-CM | POA: Diagnosis present

## 2019-06-22 DIAGNOSIS — A419 Sepsis, unspecified organism: Secondary | ICD-10-CM | POA: Diagnosis present

## 2019-06-22 DIAGNOSIS — Z8249 Family history of ischemic heart disease and other diseases of the circulatory system: Secondary | ICD-10-CM | POA: Diagnosis not present

## 2019-06-22 DIAGNOSIS — I252 Old myocardial infarction: Secondary | ICD-10-CM | POA: Diagnosis not present

## 2019-06-22 DIAGNOSIS — Z9842 Cataract extraction status, left eye: Secondary | ICD-10-CM

## 2019-06-22 DIAGNOSIS — I959 Hypotension, unspecified: Secondary | ICD-10-CM

## 2019-06-22 DIAGNOSIS — Z86010 Personal history of colonic polyps: Secondary | ICD-10-CM | POA: Diagnosis not present

## 2019-06-22 DIAGNOSIS — N186 End stage renal disease: Secondary | ICD-10-CM | POA: Diagnosis present

## 2019-06-22 DIAGNOSIS — D631 Anemia in chronic kidney disease: Secondary | ICD-10-CM | POA: Diagnosis present

## 2019-06-22 DIAGNOSIS — E1122 Type 2 diabetes mellitus with diabetic chronic kidney disease: Secondary | ICD-10-CM | POA: Diagnosis present

## 2019-06-22 DIAGNOSIS — E1169 Type 2 diabetes mellitus with other specified complication: Secondary | ICD-10-CM | POA: Diagnosis not present

## 2019-06-22 DIAGNOSIS — I2510 Atherosclerotic heart disease of native coronary artery without angina pectoris: Secondary | ICD-10-CM | POA: Diagnosis present

## 2019-06-22 DIAGNOSIS — R7989 Other specified abnormal findings of blood chemistry: Secondary | ICD-10-CM | POA: Diagnosis not present

## 2019-06-22 DIAGNOSIS — E872 Acidosis, unspecified: Secondary | ICD-10-CM

## 2019-06-22 DIAGNOSIS — Z1159 Encounter for screening for other viral diseases: Secondary | ICD-10-CM | POA: Diagnosis not present

## 2019-06-22 DIAGNOSIS — E662 Morbid (severe) obesity with alveolar hypoventilation: Secondary | ICD-10-CM | POA: Diagnosis present

## 2019-06-22 DIAGNOSIS — Z955 Presence of coronary angioplasty implant and graft: Secondary | ICD-10-CM | POA: Diagnosis not present

## 2019-06-22 DIAGNOSIS — Z8639 Personal history of other endocrine, nutritional and metabolic disease: Secondary | ICD-10-CM | POA: Diagnosis not present

## 2019-06-22 DIAGNOSIS — I953 Hypotension of hemodialysis: Secondary | ICD-10-CM | POA: Diagnosis not present

## 2019-06-22 DIAGNOSIS — E785 Hyperlipidemia, unspecified: Secondary | ICD-10-CM | POA: Diagnosis present

## 2019-06-22 DIAGNOSIS — Z7982 Long term (current) use of aspirin: Secondary | ICD-10-CM

## 2019-06-22 DIAGNOSIS — I248 Other forms of acute ischemic heart disease: Secondary | ICD-10-CM | POA: Diagnosis not present

## 2019-06-22 DIAGNOSIS — Z79899 Other long term (current) drug therapy: Secondary | ICD-10-CM

## 2019-06-22 LAB — GLUCOSE, CAPILLARY
Glucose-Capillary: 157 mg/dL — ABNORMAL HIGH (ref 70–99)
Glucose-Capillary: 154 mg/dL — ABNORMAL HIGH (ref 70–99)

## 2019-06-22 LAB — COMPREHENSIVE METABOLIC PANEL
ALT: 46 U/L — ABNORMAL HIGH (ref 0–44)
AST: 47 U/L — ABNORMAL HIGH (ref 15–41)
Albumin: 3.5 g/dL (ref 3.5–5.0)
Alkaline Phosphatase: 86 U/L (ref 38–126)
Anion gap: 21 — ABNORMAL HIGH (ref 5–15)
BUN: 20 mg/dL (ref 8–23)
CO2: 23 mmol/L (ref 22–32)
Calcium: 9.3 mg/dL (ref 8.9–10.3)
Chloride: 95 mmol/L — ABNORMAL LOW (ref 98–111)
Creatinine, Ser: 7.44 mg/dL — ABNORMAL HIGH (ref 0.44–1.00)
GFR calc Af Amer: 6 mL/min — ABNORMAL LOW (ref >60)
GFR calc non Af Amer: 5 mL/min — ABNORMAL LOW (ref >60)
Glucose, Bld: 219 mg/dL — ABNORMAL HIGH (ref 70–99)
Potassium: 3.1 mmol/L — ABNORMAL LOW (ref 3.5–5.1)
Sodium: 139 mmol/L (ref 135–145)
Total Bilirubin: 0.8 mg/dL (ref 0.3–1.2)
Total Protein: 8 g/dL (ref 6.5–8.1)

## 2019-06-22 LAB — CBC WITH DIFFERENTIAL/PLATELET
Abs Immature Granulocytes: 0.05 10*3/uL (ref 0.00–0.07)
Basophils Absolute: 0.1 10*3/uL (ref 0.0–0.1)
Basophils Relative: 1 %
Eosinophils Absolute: 0 10*3/uL (ref 0.0–0.5)
Eosinophils Relative: 0 %
HCT: 48.1 % — ABNORMAL HIGH (ref 36.0–46.0)
Hemoglobin: 14.5 g/dL (ref 12.0–15.0)
Immature Granulocytes: 0 %
Lymphocytes Relative: 11 %
Lymphs Abs: 1.7 10*3/uL (ref 0.7–4.0)
MCH: 31.3 pg (ref 26.0–34.0)
MCHC: 30.1 g/dL (ref 30.0–36.0)
MCV: 103.9 fL — ABNORMAL HIGH (ref 80.0–100.0)
Monocytes Absolute: 1.1 10*3/uL — ABNORMAL HIGH (ref 0.1–1.0)
Monocytes Relative: 7 %
Neutro Abs: 12.2 10*3/uL — ABNORMAL HIGH (ref 1.7–7.7)
Neutrophils Relative %: 81 %
Platelets: 204 10*3/uL (ref 150–400)
RBC: 4.63 MIL/uL (ref 3.87–5.11)
RDW: 14.1 % (ref 11.5–15.5)
WBC: 15.2 10*3/uL — ABNORMAL HIGH (ref 4.0–10.5)
nRBC: 0 % (ref 0.0–0.2)

## 2019-06-22 LAB — TROPONIN I (HIGH SENSITIVITY)
Troponin I (High Sensitivity): 43 ng/L — ABNORMAL HIGH (ref <18)
Troponin I (High Sensitivity): 58 ng/L — ABNORMAL HIGH (ref <18)

## 2019-06-22 LAB — PROTIME-INR
INR: 1.1 (ref 0.8–1.2)
Prothrombin Time: 13.9 seconds (ref 11.4–15.2)

## 2019-06-22 LAB — LACTIC ACID, PLASMA
Lactic Acid, Venous: 5.6 mmol/L (ref 0.5–1.9)
Lactic Acid, Venous: 3.2 mmol/L (ref 0.5–1.9)

## 2019-06-22 LAB — SARS CORONAVIRUS 2 BY RT PCR (HOSPITAL ORDER, PERFORMED IN ~~LOC~~ HOSPITAL LAB): SARS Coronavirus 2: NEGATIVE

## 2019-06-22 LAB — APTT: aPTT: 30 seconds (ref 24–36)

## 2019-06-22 MED ORDER — VITAMIN D 25 MCG (1000 UNIT) PO TABS
2000.0000 [IU] | ORAL_TABLET | Freq: Every day | ORAL | Status: DC
  Administered 2019-06-22 – 2019-06-23 (×2): 2000 [IU] via ORAL
  Filled 2019-06-22 (×2): qty 5
  Filled 2019-06-22 (×2): qty 2

## 2019-06-22 MED ORDER — VANCOMYCIN HCL 10 G IV SOLR
2000.0000 mg | Freq: Once | INTRAVENOUS | Status: DC
  Filled 2019-06-22: qty 2000

## 2019-06-22 MED ORDER — ONDANSETRON HCL 4 MG PO TABS: 4.0000 mg | ORAL_TABLET | Freq: Four times a day (QID) | ORAL | Status: DC | PRN

## 2019-06-22 MED ORDER — ONDANSETRON HCL 4 MG/2ML IJ SOLN: 4.0000 mg | Freq: Four times a day (QID) | INTRAMUSCULAR | Status: DC | PRN

## 2019-06-22 MED ORDER — SODIUM CHLORIDE 0.9 % IV BOLUS
500.0000 mL | Freq: Once | INTRAVENOUS | Status: AC
  Administered 2019-06-22: 500 mL via INTRAVENOUS

## 2019-06-22 MED ORDER — EZETIMIBE 10 MG PO TABS
10.0000 mg | ORAL_TABLET | Freq: Every day | ORAL | Status: DC
  Administered 2019-06-22 – 2019-06-23 (×2): 10 mg via ORAL
  Filled 2019-06-22 (×3): qty 1

## 2019-06-22 MED ORDER — NITROGLYCERIN 0.4 MG SL SUBL: 0.4000 mg | SUBLINGUAL_TABLET | SUBLINGUAL | Status: DC | PRN

## 2019-06-22 MED ORDER — VANCOMYCIN HCL IN DEXTROSE 1-5 GM/200ML-% IV SOLN: 1000.0000 mg | Freq: Once | INTRAVENOUS | Status: DC

## 2019-06-22 MED ORDER — MIDODRINE HCL 5 MG PO TABS: 10.0000 mg | ORAL_TABLET | ORAL | Status: DC

## 2019-06-22 MED ORDER — PANTOPRAZOLE SODIUM 40 MG PO TBEC
40.0000 mg | DELAYED_RELEASE_TABLET | Freq: Every day | ORAL | Status: DC
  Administered 2019-06-22 – 2019-06-23 (×2): 40 mg via ORAL
  Filled 2019-06-22 (×3): qty 1

## 2019-06-22 MED ORDER — SODIUM CHLORIDE 0.9 % IV SOLN
1.0000 g | INTRAVENOUS | Status: DC
  Administered 2019-06-23: 1 g via INTRAVENOUS
  Filled 2019-06-22 (×2): qty 1

## 2019-06-22 MED ORDER — SODIUM CHLORIDE 0.9% FLUSH
3.0000 mL | Freq: Two times a day (BID) | INTRAVENOUS | Status: DC
  Administered 2019-06-22 – 2019-06-24 (×5): 3 mL via INTRAVENOUS

## 2019-06-22 MED ORDER — ASPIRIN 81 MG PO TBEC
81.0000 mg | DELAYED_RELEASE_TABLET | Freq: Every day | ORAL | Status: DC
  Filled 2019-06-22 (×2): qty 1

## 2019-06-22 MED ORDER — SODIUM CHLORIDE 0.9% FLUSH: 3.0000 mL | INTRAVENOUS | Status: DC | PRN

## 2019-06-22 MED ORDER — VANCOMYCIN HCL IN DEXTROSE 1-5 GM/200ML-% IV SOLN: 1000.0000 mg | INTRAVENOUS | Status: DC

## 2019-06-22 MED ORDER — METRONIDAZOLE IN NACL 5-0.79 MG/ML-% IV SOLN
500.0000 mg | Freq: Once | INTRAVENOUS | Status: AC
  Administered 2019-06-22: 500 mg via INTRAVENOUS
  Filled 2019-06-22: qty 100

## 2019-06-22 MED ORDER — LACTATED RINGERS IV BOLUS: 1000.0000 mL | Freq: Once | INTRAVENOUS | Status: DC

## 2019-06-22 MED ORDER — ROSUVASTATIN CALCIUM 20 MG PO TABS
40.0000 mg | ORAL_TABLET | Freq: Every day | ORAL | Status: DC
  Administered 2019-06-22 – 2019-06-24 (×3): 40 mg via ORAL
  Filled 2019-06-22 (×3): qty 2

## 2019-06-22 MED ORDER — SODIUM CHLORIDE 0.9 % IV SOLN: 250.0000 mL | INTRAVENOUS | Status: DC | PRN

## 2019-06-22 MED ORDER — VITAMIN D (ERGOCALCIFEROL) 50 MCG (2000 UT) PO CAPS: 2000.0000 [IU] | ORAL_CAPSULE | Freq: Every day | ORAL | Status: DC

## 2019-06-22 MED ORDER — METOCLOPRAMIDE HCL 10 MG PO TABS
10.0000 mg | ORAL_TABLET | Freq: Every day | ORAL | Status: DC
  Administered 2019-06-22 – 2019-06-23 (×2): 10 mg via ORAL
  Filled 2019-06-22 (×3): qty 1

## 2019-06-22 MED ORDER — INSULIN ASPART 100 UNIT/ML ~~LOC~~ SOLN
0.0000 [IU] | Freq: Three times a day (TID) | SUBCUTANEOUS | Status: DC
  Administered 2019-06-22 – 2019-06-23 (×2): 2 [IU] via SUBCUTANEOUS
  Administered 2019-06-23: 1 [IU] via SUBCUTANEOUS
  Administered 2019-06-23: 2 [IU] via SUBCUTANEOUS
  Administered 2019-06-24: 1 [IU] via SUBCUTANEOUS

## 2019-06-22 MED ORDER — SODIUM CHLORIDE 0.9 % IV SOLN
2.0000 g | Freq: Once | INTRAVENOUS | Status: AC
  Administered 2019-06-22: 2 g via INTRAVENOUS

## 2019-06-22 MED ORDER — INSULIN ASPART 100 UNIT/ML ~~LOC~~ SOLN: 0.0000 [IU] | Freq: Every day | SUBCUTANEOUS | Status: DC

## 2019-06-22 MED ORDER — ASPIRIN EC 81 MG PO TBEC
81.0000 mg | DELAYED_RELEASE_TABLET | Freq: Every day | ORAL | Status: DC
  Administered 2019-06-22 – 2019-06-23 (×2): 81 mg via ORAL
  Filled 2019-06-22 (×2): qty 1

## 2019-06-22 MED ORDER — HEPARIN SODIUM (PORCINE) 5000 UNIT/ML IJ SOLN
5000.0000 [IU] | Freq: Two times a day (BID) | INTRAMUSCULAR | Status: DC
  Administered 2019-06-22 – 2019-06-24 (×4): 5000 [IU] via SUBCUTANEOUS
  Filled 2019-06-22 (×4): qty 1

## 2019-06-22 MED ORDER — FERRIC CITRATE 1 GM 210 MG(FE) PO TABS
420.0000 mg | ORAL_TABLET | Freq: Two times a day (BID) | ORAL | Status: DC
  Administered 2019-06-22 – 2019-06-23 (×2): 420 mg via ORAL
  Filled 2019-06-22 (×3): qty 2

## 2019-06-22 NOTE — ED Triage Notes (Signed)
Presents after dialysis with reports of hypotension.  Able to complete treatment 2.2 L removed per EMS.  Pt is lethargic and diaphoretic but AOx3. Pt denies any current pain.

## 2019-06-22 NOTE — ED Provider Notes (Signed)
[facility] EMERGENCY DEPARTMENT Provider Note   CSN: [id number] Arrival date & time: 06/22/19  1110    History   Chief Complaint No chief complaint on file.   HPI Jacqueline [name] is a 73 y.o. female.     HPI  73 year old female presents with hypotension.  She had a full dialysis session today and her blood pressure was in the 70s so she was sent here.  They try to give her crackers but she vomited.  She states she feels fatigued since her dialysis session which is worse than typical but otherwise has no acute complaints.  She has not been sick prior to today.  She has been taking her Midrin as prescribed both in the morning and during dialysis.  Typically after dialysis her blood pressure is around 194 systolic.  Past Medical History:  Diagnosis Date  . Arthritis    "left knee" (12/04/2016)  . CAD (coronary artery disease)    a. 12/2016 NSTEMI/PCI: LM nl, LaD 20p/d, D1 20, OM2 20, OM3 20, RCA 74m (3.0x18 Resolute DES). EF 65%.  . Chronic lower back pain   . Diverticulosis    on CT  . ESRD on dialysis [facility])    "[facility]; [place]; TTS" (12/04/2016)  . GERD (gastroesophageal reflux disease)   . Hiatal hernia   . Hyperlipidemia   . Hypertension    a. 12/2016 labetalol d/c'd 2/2 hypotension in HD.  . Morbid obesity ([place])   . Obesity   . Pneumonia    "couple times" (12/04/2016)  . Renal insufficiency   . Type II diabetes mellitus [facility])     Patient Active Problem List   Diagnosis Date Noted  . Sepsis ([place]) 06/22/2019  . Pain due to onychomycosis of toenails of both feet 06/11/2019  . Callus 06/11/2019  . Mixed hyperlipidemia 03/22/2019  . Otalgia of both ears 03/22/2019  . Right arm pain 03/22/2019  . Bradycardia 10/01/2017  . Dyslipidemia 10/01/2017  . History of NSTEMI   . CAD S/P percutaneous coronary angioplasty 12/03/2016  . Pain in lower limb 08/26/2014  . HCAP (healthcare-associated pneumonia) 02/13/2014  . 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 OSA (obstructive sleep apnea) 01/21/2014  . 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[place]) 01/21/2014  . Acute encephalopathy 01/19/2014  . Congestive heart failure ([place]) 01/15/2014  . ESRD (end stage renal disease) on dialysis ([place]) 01/15/2014  . Hypertension 01/15/2014  . Diabetes mellitus ([place]) 01/15/2014  . Anemia 01/15/2014  . Onychomycosis 03/26/2013  . Pain in joint, ankle and foot 03/26/2013    Past Surgical History:  Procedure Laterality Date  . ABDOMINAL HYSTERECTOMY    . AV FISTULA PLACEMENT Left 01/27/2014   Procedure: ARTERIOVENOUS (AV) FISTULA CREATION;  Surgeon: Mal Misty, MD;  Location: [facility];  Service: Vascular;  Laterality: Left;  . AV FISTULA REPAIR     "had it cleaned out"  . CARDIAC CATHETERIZATION  ?1980s  . CARDIAC CATHETERIZATION N/A 12/04/2016   Procedure: Left Heart Cath Angiography;  Surgeon: Burnell Blanks, MD;  Location: [place] CV LAB;  Service: Cardiovascular;  Laterality: N/A;  . CARDIAC CATHETERIZATION N/A 12/04/2016   Procedure: Coronary Stent Intervention;  Surgeon: Burnell Blanks, MD;  Location: [place] CV LAB;  Service: Cardiovascular;  Laterality: N/A;  Mid RCA  . CATARACT EXTRACTION W/ INTRAOCULAR LENS IMPLANT Left   . COLONOSCOPY W/ BIOPSIES AND POLYPECTOMY    . CORONARY ANGIOPLASTY WITH STENT PLACEMENT  12/04/2016  . ESOPHAGOGASTRODUODENOSCOPY    .  INSERTION OF DIALYSIS CATHETER N/A 01/19/2014   Procedure: INSERTION OF DIALYSIS CATHETER;  Surgeon: Rosetta Posner, MD;  Location: [facility] OR;  Service: Vascular;  Laterality: N/A;     OB History   No obstetric history on file.      Home Medications    Prior to Admission medications   Medication Sig Start Date End Date Taking? Authorizing Provider  aspirin EC 81 MG EC tablet Take 1 tablet (81 mg total) by mouth daily. 12/06/16  Yes Vann, Jessica U, DO  AURYXIA 1 GM 210 MG(Fe) tablet Take 420 mg by mouth 2 (two) times a day. 06/08/19  Yes  [provider]  B Complex-C-Folic Acid (DIALYVITE 756) 0.8 MG TABS Take 1 tablet by mouth daily. 09/29/18  Yes [provider]  diclofenac sodium (VOLTAREN) 1 % GEL Apply 2 g topically 4 (four) times daily. Patient taking differently: Apply 2 g topically as needed (Shoulder and arms).  03/22/19  Yes [name], FNP  ezetimibe (ZETIA) 10 MG tablet TAKE 1 TABLET BY MOUTH EVERY DAY Patient taking differently: Take 10 mg by mouth daily.  06/10/19  Yes Turner, Eber Hong, MD  metoCLOPramide (REGLAN) 10 MG tablet TAKE 1 TABLET BY ORAL ROUTE 2 TIMES EVERY DAY 30 MINUTES BEFORE MEALS AND AT BEDTIME Patient taking differently: Take 10 mg by mouth daily.  03/18/19  Yes [name], FNP  midodrine (PROAMATINE) 10 MG tablet Take 10 mg by mouth 3 (three) times a week. Dialysis days [place] Saturday   Yes [provider]  Omega-3 Fatty Acids (FISH OIL) 1000 MG CPDR Take 1,000 mg by mouth daily.    Yes [provider]  omeprazole (PRILOSEC) 20 MG capsule TAKE ONE CAPSULE EVERY DAY BEFORE MEALS Patient taking differently: Take 20 mg by mouth daily.  01/11/19  Yes [name], FNP  rosuvastatin (CRESTOR) 40 MG tablet TAKE 1 TABLET BY MOUTH DAILY Patient taking differently: Take 40 mg by mouth daily.  11/27/18  Yes Turner, Eber Hong, MD  Vitamin D, Ergocalciferol, 50 MCG (2000 UT) CAPS Take 2,000 Units by mouth daily.   Yes [provider]  benzoyl peroxide (BENZOYL PEROXIDE) 5 % external liquid Apply topically 2 (two) times daily. Patient not taking: Reported on 06/22/2019 11/20/18   [name], FNP  nitroGLYCERIN (NITROSTAT) 0.4 MG SL tablet Place 1 tablet (0.4 mg total) under the tongue every 5 (five) minutes as needed for chest pain. 12/09/16 06/22/19  Sueanne Margarita, MD    Family History Family History  Problem Relation Age of Onset  . Diabetes Mother   . Peripheral vascular disease Mother        amputation  . CAD Father   . Heart disease Father   .  Hypertension Father   . Heart attack Father   . CAD Sister   . Cancer Brother        x 2 brothers, type unknown  . Breast cancer Sister     Social History Social History   Tobacco Use  . Smoking status: Former Smoker    Packs/day: 1.00    Years: 20.00    Pack years: 20.00    Types: Cigarettes    Quit date: 1985    Years since quitting: 35.5  . Smokeless tobacco: Never Used  Substance Use Topics  . Alcohol use: No    Alcohol/week: 0.0 standard drinks  . Drug use: No     Allergies   Sulfa drugs cross reactors   Review of  Systems Review of Systems  Constitutional: Positive for fatigue. Negative for fever.  Respiratory: Negative for cough and shortness of breath.   Cardiovascular: Negative for chest pain.  Gastrointestinal: Positive for vomiting. Negative for abdominal pain.  Genitourinary:       Does not urinate  Skin: Negative for rash.  Neurological: Negative for headaches.  All other systems reviewed and are negative.    Physical Exam Updated Vital Signs BP (!) 102/44 (BP Location: Right Wrist)   Pulse 68   Temp (!) 97.5 F (36.4 C) (Oral)   Resp 18   Ht 5\' 4"  (1.626 m)   Wt 103.9 kg   SpO2 100%   BMI 39.31 kg/m   Physical Exam Vitals signs and nursing note reviewed.  Constitutional:      General: She is not in acute distress.    Appearance: She is well-developed. She is not ill-appearing or diaphoretic.  HENT:     Head: Normocephalic and atraumatic.     Right Ear: External ear normal.     Left Ear: External ear normal.     Nose: Nose normal.  Eyes:     General:        Right eye: No discharge.        Left eye: No discharge.  Cardiovascular:     Rate and Rhythm: Normal rate and regular rhythm.     Heart sounds: Normal heart sounds.  Pulmonary:     Effort: Pulmonary effort is normal.     Breath sounds: Normal breath sounds.  Abdominal:     Palpations: Abdomen is soft.     Tenderness: There is no abdominal tenderness.  Skin:    General:  Skin is warm and dry.  Neurological:     Mental Status: She is alert.     Comments: Awake, alert, normal speech, no facial droop. 5/5 strength in all 4 extremities.   Psychiatric:        Mood and Affect: Mood is not anxious.      ED Treatments / Results  Labs (all labs ordered are listed, but only abnormal results are displayed) Labs Reviewed  LACTIC ACID, PLASMA - Abnormal; Notable for the following components:      Result Value   Lactic Acid, Venous 3.2 (*)    All other components within normal limits  LACTIC ACID, PLASMA - Abnormal; Notable for the following components:   Lactic Acid, Venous 5.6 (*)    All other components within normal limits  COMPREHENSIVE METABOLIC PANEL - Abnormal; Notable for the following components:   Potassium 3.1 (*)    Chloride 95 (*)    Glucose, Bld 219 (*)    Creatinine, Ser 7.44 (*)    AST 47 (*)    ALT 46 (*)    GFR calc non Af Amer 5 (*)    GFR calc Af Amer 6 (*)    Anion gap 21 (*)    All other components within normal limits  CBC WITH DIFFERENTIAL/PLATELET - Abnormal; Notable for the following components:   WBC 15.2 (*)    HCT 48.1 (*)    MCV 103.9 (*)    Neutro Abs 12.2 (*)    Monocytes Absolute 1.1 (*)    All other components within normal limits  SARS CORONAVIRUS 2 (HOSPITAL ORDER, [place] LAB)  CULTURE, BLOOD (ROUTINE X 2)  CULTURE, BLOOD (ROUTINE X 2)  URINE CULTURE  APTT  PROTIME-INR  URINALYSIS, ROUTINE W REFLEX MICROSCOPIC  TROPONIN I (  HIGH SENSITIVITY)    EKG EKG Interpretation  Date/Time:  Tuesday June 22 2019 11:18:40 EDT Ventricular Rate:  81 PR Interval:    QRS Duration: 102 QT Interval:  422 QTC Calculation: 490 R Axis:   90 Text Interpretation:  Sinus rhythm Borderline right axis deviation Low voltage, precordial leads Borderline prolonged QT interval no significant change since Aug 2018 Confirmed by Sherwood Gambler [phone]) on 06/22/2019 11:30:21 AM   Radiology Dg Chest Portable  1 View  Result Date: 06/22/2019 CLINICAL DATA:  Hypotension. Weakness and fatigue during dialysis today. EXAM: PORTABLE CHEST 1 VIEW COMPARISON:  12/31/2016. FINDINGS: The cardiac silhouette remains borderline enlarged. Clear lungs with normal vascularity. Stable mildly prominent interstitial markings. Thoracic and cervical spine degenerative changes. IMPRESSION: No acute abnormality. Stable mild chronic interstitial lung disease. Electronically Signed   By: Claudie Revering M.D.   On: 06/22/2019 12:09    Procedures .Critical Care Performed by: Sherwood Gambler, MD Authorized by: Sherwood Gambler, MD   Critical care provider statement:    Critical care time (minutes):  40   Critical care time was exclusive of:  Separately billable procedures and treating other patients   Critical care was necessary to treat or prevent imminent or life-threatening deterioration of the following conditions:  Shock and sepsis   Critical care was time spent personally by me on the following activities:  Discussions with consultants, evaluation of patient's response to treatment, examination of patient, ordering and performing treatments and interventions, ordering and review of laboratory studies, ordering and review of radiographic studies, pulse oximetry, re-evaluation of patient's condition, obtaining history from patient or surrogate and review of old charts   (including critical care time)  Medications Ordered in ED Medications  metroNIDAZOLE (FLAGYL) IVPB 500 mg (has no administration in time range)  lactated ringers bolus 1,000 mL (has no administration in time range)  vancomycin (VANCOCIN) 2,000 mg in sodium chloride 0.9 % 500 mL IVPB (2,000 mg Intravenous Not Given 06/22/19 1516)  vancomycin (VANCOCIN) IVPB 1000 mg/200 mL premix (has no administration in time range)  ceFEPIme (MAXIPIME) 1 g in sodium chloride 0.9 % 100 mL IVPB (has no administration in time range)  ezetimibe (ZETIA) tablet 10 mg (10 mg Oral  Given 06/22/19 1518)  midodrine (PROAMATINE) tablet 10 mg (has no administration in time range)  nitroGLYCERIN (NITROSTAT) SL tablet 0.4 mg (has no administration in time range)  rosuvastatin (CRESTOR) tablet 40 mg (40 mg Oral Given 06/22/19 1520)  ferric citrate (AURYXIA) tablet 420 mg (420 mg Oral Given 06/22/19 1521)  metoCLOPramide (REGLAN) tablet 10 mg (10 mg Oral Given 06/22/19 1518)  pantoprazole (PROTONIX) EC tablet 40 mg (40 mg Oral Given 06/22/19 1519)  heparin injection 5,000 Units (has no administration in time range)  sodium chloride flush (NS) 0.9 % injection 3 mL (3 mLs Intravenous Given 06/22/19 1521)  sodium chloride flush (NS) 0.9 % injection 3 mL (has no administration in time range)  0.9 %  sodium chloride infusion (has no administration in time range)  ondansetron (ZOFRAN) tablet 4 mg (has no administration in time range)    Or  ondansetron (ZOFRAN) injection 4 mg (has no administration in time range)  insulin aspart (novoLOG) injection 0-9 Units (has no administration in time range)  insulin aspart (novoLOG) injection 0-5 Units (has no administration in time range)  cholecalciferol (VITAMIN D3) tablet 2,000 Units (2,000 Units Oral Given 06/22/19 1520)  aspirin EC tablet 81 mg (81 mg Oral Given 06/22/19 1519)  sodium chloride 0.9 % bolus  500 mL (0 mLs Intravenous Stopped 06/22/19 1318)  ceFEPIme (MAXIPIME) 2 g in sodium chloride 0.9 % 100 mL IVPB (2 g Intravenous New Bag/Given 06/22/19 1347)     Initial Impression / Assessment and Plan / ED Course  I have reviewed the triage vital signs and the nursing notes.  Pertinent labs & imaging results that were available during my care of the patient were reviewed by me and considered in my medical decision making (see chart for details).        Patient's blood pressure has come up with a small fluid bolus.  Her lactate is surprisingly above 5.  She has no obvious source of an acute infection.  Unclear if the lactate is contributing  such as from sepsis or if is secondary to prolonged hypotension.  I think either way she will need admission with supportive care and further investigation.  Blood cultures will be obtained and I will give broad antibiotics at this time.  Discussed with Dr. Laren Everts who will admit.  Jacqueline [name] was evaluated in Emergency Department on 06/22/2019 for the symptoms described in the history of present illness. She was evaluated in the context of the global COVID-19 pandemic, which necessitated consideration that the patient might be at risk for infection with the SARS-CoV-2 virus that causes COVID-19. Institutional protocols and algorithms that pertain to the evaluation of patients at risk for COVID-19 are in a state of rapid change based on information released by regulatory bodies including the CDC and federal and state organizations. These policies and algorithms were followed during the patient's care in the ED.   Final Clinical Impressions(s) / ED Diagnoses   Final diagnoses:  Hypotension, unspecified hypotension type  Lactic acidosis    ED Discharge Orders    None       Sherwood Gambler, MD 06/22/19 1537

## 2019-06-22 NOTE — ED Notes (Signed)
Jacqueline [name] is husband waiting outside for updates phone number [phone]

## 2019-06-22 NOTE — ED Notes (Signed)
ED TO INPATIENT HANDOFF REPORT  ED Nurse Name and Phone #: Celene Squibb RN  S Name/Age/Gender Jacqueline [name] 73 y.o. female Room/Bed: 023C/023C  Code Status   Code Status: Prior  Home/SNF/Other Home Patient oriented to: self, place, time and situation Is this baseline? Yes   Triage Complete: Triage complete  Chief Complaint hypotension dialysis pt  Triage Note Presents after dialysis with reports of hypotension.  Able to complete treatment 2.2 L removed per EMS.  Pt is lethargic and diaphoretic but AOx3. Pt denies any current pain.    Allergies Allergies  Allergen Reactions  . Sulfa Drugs Cross Reactors Other (See Comments)    Doesn't remember     Level of Care/Admitting Diagnosis ED Disposition    ED Disposition Condition [facility] Area: [place] [100100]  Level of Care: Telemetry Medical [104]  Covid Evaluation: Asymptomatic Screening Protocol (No Symptoms)  Diagnosis: Sepsis [facility]) [[id number]]  Admitting Physician: Merton Border [[id number]]  Attending Physician: Laren Everts, ALI Marshal.[place]  Estimated length of stay: past midnight tomorrow  Certification:: I certify this patient will need inpatient services for at least 2 midnights  PT Class (Do Not Modify): Inpatient [101]  PT Acc Code (Do Not Modify): Private [1]       B Medical/Surgery History Past Medical History:  Diagnosis Date  . Arthritis    "left knee" (12/04/2016)  . CAD (coronary artery disease)    a. 12/2016 NSTEMI/PCI: LM nl, LaD 20p/d, D1 20, OM2 20, OM3 20, RCA 7m (3.0x18 Resolute DES). EF 65%.  . Chronic lower back pain   . Diverticulosis    on CT  . ESRD on dialysis [facility])    "[facility]; [place]; TTS" (12/04/2016)  . GERD (gastroesophageal reflux disease)   . Hiatal hernia   . Hyperlipidemia   . Hypertension    a. 12/2016 labetalol d/c'd 2/2 hypotension in HD.  . Morbid obesity ([place])   . Obesity   . Pneumonia    "couple times" (12/04/2016)  . Renal  insufficiency   . Type II diabetes mellitus ([place])    Past Surgical History:  Procedure Laterality Date  . ABDOMINAL HYSTERECTOMY    . AV FISTULA PLACEMENT Left 01/27/2014   Procedure: ARTERIOVENOUS (AV) FISTULA CREATION;  Surgeon: Mal Misty, MD;  Location: [place];  Service: Vascular;  Laterality: Left;  . AV FISTULA REPAIR     "had it cleaned out"  . CARDIAC CATHETERIZATION  ?1980s  . CARDIAC CATHETERIZATION N/A 12/04/2016   Procedure: Left Heart Cath Angiography;  Surgeon: Burnell Blanks, MD;  Location: [place] CV LAB;  Service: Cardiovascular;  Laterality: N/A;  . CARDIAC CATHETERIZATION N/A 12/04/2016   Procedure: Coronary Stent Intervention;  Surgeon: Burnell Blanks, MD;  Location: [place] CV LAB;  Service: Cardiovascular;  Laterality: N/A;  Mid RCA  . CATARACT EXTRACTION W/ INTRAOCULAR LENS IMPLANT Left   . COLONOSCOPY W/ BIOPSIES AND POLYPECTOMY    . CORONARY ANGIOPLASTY WITH STENT PLACEMENT  12/04/2016  . ESOPHAGOGASTRODUODENOSCOPY    . INSERTION OF DIALYSIS CATHETER N/A 01/19/2014   Procedure: INSERTION OF DIALYSIS CATHETER;  Surgeon: Rosetta Posner, MD;  Location: [facility] OR;  Service: Vascular;  Laterality: N/A;     A IV Location/Drains/Wounds Patient Lines/Drains/Airways Status   Active Line/Drains/Airways    Name:   Placement date:   Placement time:   Site:   Days:   Peripheral IV 06/22/19 Right Antecubital   06/22/19    1140  Antecubital   less than 1   Fistula / Graft Left Arteriovenous vein graft   01/27/14    0800    -   1972   Hemodialysis Catheter Right   01/19/14    0935    Internal jugular   1980   Incision (Closed) 01/27/14 Arm Left   01/27/14    0809     1972          Intake/Output Last 24 hours  Intake/Output Summary (Last 24 hours) at 06/22/2019 1337 Last data filed at 06/22/2019 1318 Gross per 24 hour  Intake 500 ml  Output -  Net 500 ml    Labs/Imaging Results for orders placed or performed during the hospital encounter of  06/22/19 (from the past 48 hour(s))  Comprehensive metabolic panel     Status: Abnormal   Collection Time: 06/22/19 11:38 AM  Result Value Ref Range   Sodium 139 135 - 145 mmol/L   Potassium 3.1 (L) 3.5 - 5.1 mmol/L   Chloride 95 (L) 98 - 111 mmol/L   CO2 23 22 - 32 mmol/L   Glucose, Bld 219 (H) 70 - 99 mg/dL   BUN 20 8 - 23 mg/dL   Creatinine, Ser 7.44 (H) 0.44 - 1.00 mg/dL   Calcium 9.3 8.9 - 10.3 mg/dL   Total Protein 8.0 6.5 - 8.1 g/dL   Albumin 3.5 3.5 - 5.0 g/dL   AST 47 (H) 15 - 41 U/L   ALT 46 (H) 0 - 44 U/L   Alkaline Phosphatase 86 38 - 126 U/L   Total Bilirubin 0.8 0.3 - 1.2 mg/dL   GFR calc non Af Amer 5 (L) >60 mL/min   GFR calc Af Amer 6 (L) >60 mL/min   Anion gap 21 (H) 5 - 15    Comment: Performed at [facility], [address], [place], [place] [address]  CBC with Differential     Status: Abnormal   Collection Time: 06/22/19 11:38 AM  Result Value Ref Range   WBC 15.2 (H) 4.0 - 10.5 K/uL   RBC 4.63 3.87 - 5.11 MIL/uL   Hemoglobin 14.5 12.0 - 15.0 g/dL   HCT 48.1 (H) 36.0 - 46.0 %   MCV 103.9 (H) 80.0 - 100.0 fL   MCH 31.3 26.0 - 34.0 pg   MCHC 30.1 30.0 - 36.0 g/dL   RDW 14.1 11.5 - 15.5 %   Platelets 204 150 - 400 K/uL   nRBC 0.0 0.0 - 0.2 %   Neutrophils Relative % 81 %   Neutro Abs 12.2 (H) 1.7 - 7.7 K/uL   Lymphocytes Relative 11 %   Lymphs Abs 1.7 0.7 - 4.0 K/uL   Monocytes Relative 7 %   Monocytes Absolute 1.1 (H) 0.1 - 1.0 K/uL   Eosinophils Relative 0 %   Eosinophils Absolute 0.0 0.0 - 0.5 K/uL   Basophils Relative 1 %   Basophils Absolute 0.1 0.0 - 0.1 K/uL   Immature Granulocytes 0 %   Abs Immature Granulocytes 0.05 0.00 - 0.07 K/uL    Comment: Performed at [facility], [place] [address] [place], [place] [address]  Lactic acid, plasma     Status: Abnormal   Collection Time: 06/22/19 12:00 PM  Result Value Ref Range   Lactic Acid, Venous 5.6 (HH) 0.5 - 1.9 mmol/L    Comment: CRITICAL RESULT CALLED TO, READ BACK BY AND VERIFIED  WITH: LISA REAGAN,RN AT 1250 06/22/2019 BY ZBEECH. Performed at [facility]  Hospital Lab, [place] [address] [place], [place] [address]    Dg Chest Portable 1 View  Result Date: 06/22/2019 CLINICAL DATA:  Hypotension. Weakness and fatigue during dialysis today. EXAM: PORTABLE CHEST 1 VIEW COMPARISON:  12/31/2016. FINDINGS: The cardiac silhouette remains borderline enlarged. Clear lungs with normal vascularity. Stable mildly prominent interstitial markings. Thoracic and cervical spine degenerative changes. IMPRESSION: No acute abnormality. Stable mild chronic interstitial lung disease. Electronically Signed   By: Claudie Revering M.D.   On: 06/22/2019 12:09    Pending Labs Unresulted Labs (From admission, onward)    Start     Ordered   06/22/19 1301  SARS Coronavirus 2 (CEPHEID - Performed in [place] hospital lab), Hosp Order  (Asymptomatic Patients Labs)  Once,   STAT    Question:  Rule Out  Answer:  Yes   06/22/19 1300   06/22/19 1256  APTT  ONCE - STAT,   STAT     06/22/19 1255   06/22/19 1256  Protime-INR  ONCE - STAT,   STAT     06/22/19 1255   06/22/19 1256  Blood Culture (routine x 2)  BLOOD CULTURE X 2,   STAT     06/22/19 1255   06/22/19 1256  Urinalysis, Routine w reflex microscopic  ONCE - STAT,   STAT     06/22/19 1255   06/22/19 1256  Urine culture  ONCE - STAT,   STAT     06/22/19 1255   06/22/19 1138  Lactic acid, plasma  Now then every 2 hours,   STAT     06/22/19 1137   Signed and Held  CBC  Tomorrow morning,   R     Signed and Held   Signed and Held  Basic metabolic panel  Tomorrow morning,   R     Signed and Held          Vitals/Pain Today's Vitals   06/22/19 1122 06/22/19 1130 06/22/19 1145 06/22/19 1200  BP: (!) 94/49 (!) 71/48 98/60   Pulse:      Resp:  (!) 21 (!) 23   Temp:      TempSrc:      SpO2:      Weight:    103.9 kg  Height:    5\' 4"  (1.626 m)  PainSc:        Isolation Precautions No active isolations  Medications Medications  ceFEPIme  (MAXIPIME) 2 g in sodium chloride 0.9 % 100 mL IVPB (has no administration in time range)  metroNIDAZOLE (FLAGYL) IVPB 500 mg (has no administration in time range)  lactated ringers bolus 1,000 mL (has no administration in time range)  vancomycin (VANCOCIN) 2,000 mg in sodium chloride 0.9 % 500 mL IVPB (has no administration in time range)  vancomycin (VANCOCIN) IVPB 1000 mg/200 mL premix (has no administration in time range)  ceFEPIme (MAXIPIME) 1 g in sodium chloride 0.9 % 100 mL IVPB (has no administration in time range)  sodium chloride 0.9 % bolus 500 mL (0 mLs Intravenous Stopped 06/22/19 1318)    Mobility walks Low fall risk   Focused Assessments Renal Assessment Handoff:  Hemodialysis Schedule: Hemodialysis Schedule: Tuesday/Thursday/Saturday Last Hemodialysis date and time: Today   Restricted appendage: left arm     R Recommendations: See Admitting Provider Note  Report given to:   Additional Notes:

## 2019-06-22 NOTE — ED Notes (Signed)
Pt remains alert and oriented with no distress. Reviewed POC with patient and MD in room.

## 2019-06-22 NOTE — ED Notes (Signed)
Pt unable to make urine.  Will not be able to collect sample.

## 2019-06-22 NOTE — H&P (Addendum)
[facility]                                                                                    Patient Demographics  Jacqueline [name], is a 73 y.o. female  CSN: [id number]  MRN: [id number]  DOB - 23-Oct-1946  Admit Date - 06/22/2019  Outpatient Primary MD for the patient is [name], FNP   With History of -  Past Medical History:  Diagnosis Date  . Arthritis    "left knee" (12/04/2016)  . CAD (coronary artery disease)    a. 12/2016 NSTEMI/PCI: LM nl, LaD 20p/d, D1 20, OM2 20, OM3 20, RCA 66m (3.0x18 Resolute DES). EF 65%.  . Chronic lower back pain   . Diverticulosis    on CT  . ESRD on dialysis [facility])    "[facility]; [place]; TTS" (12/04/2016)  . GERD (gastroesophageal reflux disease)   . Hiatal hernia   . Hyperlipidemia   . Hypertension    a. 12/2016 labetalol d/c'd 2/2 hypotension in HD.  . Morbid obesity ([place])   . Obesity   . Pneumonia    "couple times" (12/04/2016)  . Renal insufficiency   . Type II diabetes mellitus ([place])       Past Surgical History:  Procedure Laterality Date  . ABDOMINAL HYSTERECTOMY    . AV FISTULA PLACEMENT Left 01/27/2014   Procedure: ARTERIOVENOUS (AV) FISTULA CREATION;  Surgeon: Mal Misty, MD;  Location: [place];  Service: Vascular;  Laterality: Left;  . AV FISTULA REPAIR     "had it cleaned out"  . CARDIAC CATHETERIZATION  ?1980s  . CARDIAC CATHETERIZATION N/A 12/04/2016   Procedure: Left Heart Cath Angiography;  Surgeon: Burnell Blanks, MD;  Location: [place] CV LAB;  Service: Cardiovascular;  Laterality: N/A;  . CARDIAC CATHETERIZATION N/A 12/04/2016   Procedure: Coronary Stent Intervention;  Surgeon: Burnell Blanks, MD;  Location: [place] CV LAB;  Service: Cardiovascular;  Laterality: N/A;  Mid RCA  . CATARACT EXTRACTION W/ INTRAOCULAR LENS IMPLANT Left   . COLONOSCOPY W/ BIOPSIES AND POLYPECTOMY    . CORONARY ANGIOPLASTY WITH STENT PLACEMENT  12/04/2016  . ESOPHAGOGASTRODUODENOSCOPY     . INSERTION OF DIALYSIS CATHETER N/A 01/19/2014   Procedure: INSERTION OF DIALYSIS CATHETER;  Surgeon: Rosetta Posner, MD;  Location: [place];  Service: Vascular;  Laterality: N/A;    in for   No chief complaint on file.    HPI  Jacqueline [name]  is a 73 y.o. 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  Patient feels fatigued, no chest pains, shortness of breath, nausea, vomiting or diarrhea.  Patient received 500 cc normal saline bolus in the emergency room and the blood pressure started to pick up.  No report of fever or chills In the emergency room the patient was found to have leukocytosis with elevated lactic acid at 5.6.  Her EKG was normal and so was her temperature. However at this time patient  seems to be feeling well Patient started on IV antibiotics and cultures were drawn in the emergency room    Review of Systems    In addition to the HPI above,  No Fever-chills, No Headache, No changes with Vision or hearing, No problems swallowing food or Liquids, No Chest pain, Cough or Shortness of Breath, No Abdominal pain, No Nausea or Vommitting, Bowel movements are regular, No Blood in stool or Urine, No dysuria, No new skin rashes or bruises, No new joints pains-aches,  No new weakness, tingling, numbness in any extremity, No recent weight gain or loss, No polyuria, polydypsia or polyphagia, No significant Mental Stressors.     Social History Social History   Tobacco Use  . Smoking status: Former Smoker    Packs/day: 1.00    Years: 20.00    Pack years: 20.00    Types: Cigarettes    Quit date: 1985    Years since quitting: 35.5  . Smokeless tobacco: Never Used  Substance Use Topics  . Alcohol use: No    Alcohol/week: 0.0 standard drinks      Family History Family History  Problem Relation Age of Onset  . Diabetes Mother   . Peripheral vascular disease Mother        amputation  . CAD Father   . Heart disease Father   . Hypertension Father   . Heart attack Father   . CAD Sister   . Cancer Brother        x 2 brothers, type unknown  . Breast cancer Sister      Prior to Admission medications   Medication Sig Start Date End Date Taking? Authorizing Provider  aspirin EC 81 MG EC tablet Take 1 tablet (81 mg total) by mouth daily. 12/06/16  Yes Vann, Jessica U, DO  AURYXIA 1 GM 210 MG(Fe) tablet Take 420 mg by mouth 2 (two) times a day. 06/08/19  Yes [provider]  B Complex-C-Folic Acid (DIALYVITE 570) 0.8 MG TABS Take 1 tablet by mouth daily. 09/29/18  Yes [provider]  diclofenac sodium (VOLTAREN) 1 % GEL Apply 2 g topically 4 (four) times daily. Patient taking differently: Apply 2 g topically as needed (Shoulder and arms).  03/22/19  Yes [name], FNP  ezetimibe (ZETIA) 10 MG tablet TAKE 1 TABLET BY MOUTH EVERY DAY Patient taking differently: Take 10 mg by mouth daily.  06/10/19  Yes Turner, Eber Hong, MD  metoCLOPramide (REGLAN) 10 MG tablet TAKE 1 TABLET BY ORAL ROUTE 2 TIMES EVERY DAY 30 MINUTES BEFORE MEALS AND AT BEDTIME Patient taking differently: Take 10 mg by mouth daily.  03/18/19  Yes [name], FNP  midodrine (PROAMATINE) 10 MG tablet Take 10 mg by mouth 3 (three) times a week. Dialysis days [facility] Saturday   Yes [provider]  Omega-3 Fatty Acids (FISH OIL) 1000 MG CPDR Take 1,000 mg by mouth daily.    Yes [provider]  omeprazole (PRILOSEC) 20 MG capsule TAKE ONE CAPSULE EVERY DAY BEFORE MEALS Patient taking differently: Take 20 mg by mouth daily.  01/11/19  Yes [name], FNP  rosuvastatin (CRESTOR) 40 MG tablet TAKE 1 TABLET BY MOUTH DAILY Patient taking differently: Take 40 mg by mouth daily.  11/27/18  Yes Turner, Eber Hong, MD  Vitamin D,  Ergocalciferol, 50 MCG (2000 UT) CAPS Take 2,000 Units by mouth daily.   Yes [provider]  benzoyl peroxide (BENZOYL PEROXIDE) 5 % external liquid Apply topically 2 (two) times  daily. Patient not taking: Reported on 06/22/2019 11/20/18   [name], FNP  nitroGLYCERIN (NITROSTAT) 0.4 MG SL tablet Place 1 tablet (0.4 mg total) under the tongue every 5 (five) minutes as needed for chest pain. 12/09/16 06/22/19  Sueanne Margarita, MD    Allergies  Allergen Reactions  . Sulfa Drugs Cross Reactors Other (See Comments)    Doesn't remember     Physical Exam  Vitals  Blood pressure 98/60, pulse 82, temperature (!) 97.5 F (36.4 C), temperature source Oral, resp. rate (!) 23, height 5\' 4"  (1.626 m), weight 103.9 kg, SpO2 100 %.   General appearance in no acute distress, chronically ill, very pleasant HEENT no jaundice or pallor, Ashy sclera Neck supple, no neck vein distention Chest clear and resonant Heart normal S1-S2, no murmurs gallops or rubs Abdomen soft, nontender, bowel sounds present Extremities no clubbing cyanosis mild dependent lower extremity edema Skin no rashes or ulcers noted Neuro grossly nonfocal, patient moving all extremities, alert awake oriented x3 Psych no homicidal or suicidal ideations, patient looks appropriate   Data Review  CBC Recent Labs  Lab 06/22/19 1138  WBC 15.2*  HGB 14.5  HCT 48.1*  PLT 204  MCV 103.9*  MCH 31.3  MCHC 30.1  RDW 14.1  LYMPHSABS 1.7  MONOABS 1.1*  EOSABS 0.0  BASOSABS 0.1   ------------------------------------------------------------------------------------------------------------------  Chemistries  Recent Labs  Lab 06/22/19 1138  NA 139  K 3.1*  CL 95*  CO2 23  GLUCOSE 219*  BUN 20  CREATININE 7.44*  CALCIUM 9.3  AST 47*  ALT 46*  ALKPHOS 86  BILITOT 0.8   ------------------------------------------------------------------------------------------------------------------ estimated creatinine  clearance is 7.7 mL/min (A) (by C-G formula based on SCr of 7.44 mg/dL (H)). ------------------------------------------------------------------------------------------------------------------ No results for input(s): TSH, T4TOTAL, T3FREE, THYROIDAB in the last 72 hours.  Invalid input(s): FREET3   Coagulation profile No results for input(s): INR, PROTIME in the last 168 hours. ------------------------------------------------------------------------------------------------------------------- No results for input(s): DDIMER in the last 72 hours. -------------------------------------------------------------------------------------------------------------------  Cardiac Enzymes No results for input(s): CKMB, TROPONINI, MYOGLOBIN in the last 168 hours.  Invalid input(s): CK ------------------------------------------------------------------------------------------------------------------ Invalid input(s): POCBNP   ---------------------------------------------------------------------------------------------------------------  Urinalysis    Component Value Date/Time   COLORURINE YELLOW 01/25/2014 1903   APPEARANCEUR TURBID (A) 01/25/2014 1903   LABSPEC 1.020 01/25/2014 1903   PHURINE 5.0 01/25/2014 1903   GLUCOSEU NEGATIVE 01/25/2014 1903   HGBUR LARGE (A) 01/25/2014 1903   BILIRUBINUR SMALL (A) 01/25/2014 1903   KETONESUR NEGATIVE 01/25/2014 1903   PROTEINUR 100 (A) 01/25/2014 1903   UROBILINOGEN 1.0 01/25/2014 1903   NITRITE NEGATIVE 01/25/2014 1903   LEUKOCYTESUR LARGE (A) 01/25/2014 1903    ----------------------------------------------------------------------------------------------------------------   Imaging results:   Dg Chest Portable 1 View  Result Date: 06/22/2019 CLINICAL DATA:  Hypotension. Weakness and fatigue during dialysis today. EXAM: PORTABLE CHEST 1 VIEW COMPARISON:  12/31/2016. FINDINGS: The cardiac silhouette remains borderline enlarged. Clear lungs with  normal vascularity. Stable mildly prominent interstitial markings. Thoracic and cervical spine degenerative changes. IMPRESSION: No acute abnormality. Stable mild chronic interstitial lung disease. Electronically Signed   By: Claudie Revering M.D.   On: 06/22/2019 12:09    My personal review of EKG: Rhythm NSR, Rate 81 bpm with no changes from previous  Personally reviewed Old Chart from 12/2016  Assessment & Plan  Sepsis with elevated lactic acid level of 5.6/leukocytosis with white blood cell count at 15.2 Admit to TeleWe will start patient on IV vancomycin and cefepime, cultures ordered Patient received  500 cc normal saline bolus.  Blood pressure picked up We will monitor lactic acid level  ESRD on hemodialysis Consult nephrology if needed for follow-up.  Patient is improving and may be discharged in a.m.  History of CAD status post non-STEMI in 2018 Continue with aspirin and statins  Diabetes mellitus type 2 ISS  Hyperlipidemia on Crestor and Zetia  Anemia of chronic disease continue medications  DVT Prophylaxis Heparin  AM Labs Ordered, also please review Full Orders  Family Communication:Called and D/W husband Gwyndolyn Saxon Code Status full  Disposition Plan: Home  Time spent in minutes : 5 minutes  Condition GUARDED   @SIGNATURE @

## 2019-06-22 NOTE — Progress Notes (Signed)
Pharmacy Antibiotic Note  Jacqueline [name] is a 73 y.o. female admitted on 06/22/2019 with sepsis.  Pharmacy has been consulted for vancomycin and cefepime dosing. Per pt, goes to HD T, Th and Sat. Last HD 7/21 and completed treatment (2.2 L removed). Tmax 97.5 and WBC 15.2.  Plan: Vancomycin 2g IV x1 then vanocmycin 1g IV qHD (T, Th, Sat) Cefepime 2g IV x1 then cefepime 1g IV q24h Monitor HD plans and clinical progression F/u C&S  Height: 5\' 4"  (162.6 cm) Weight: 229 lb (103.9 kg) IBW/kg (Calculated) : 54.7  Temp (24hrs), Avg:97.5 F (36.4 C), Min:97.5 F (36.4 C), Max:97.5 F (36.4 C)  Recent Labs  Lab 06/22/19 1138 06/22/19 1200  WBC 15.2*  --   CREATININE 7.44*  --   LATICACIDVEN  --  5.6*    Estimated Creatinine Clearance: 7.7 mL/min (A) (by C-G formula based on SCr of 7.44 mg/dL (H)).    Allergies  Allergen Reactions  . Sulfa Drugs Cross Reactors Other (See Comments)    Doesn't remember     Antimicrobials this admission: Vancomycin 7/21 >>  Cefepime 7/21 >>  Metronidazole x1 7/21  Dose adjustments this admission: n/a  Microbiology results: pending  Thank you for allowing pharmacy to be a part of this patient's care.  Natale Lay 06/22/2019 1:09 PM

## 2019-06-23 ENCOUNTER — Encounter (HOSPITAL_COMMUNITY): Payer: Self-pay | Admitting: Internal Medicine

## 2019-06-23 ENCOUNTER — Inpatient Hospital Stay (HOSPITAL_COMMUNITY): Payer: Medicare Other

## 2019-06-23 DIAGNOSIS — I2510 Atherosclerotic heart disease of native coronary artery without angina pectoris: Secondary | ICD-10-CM

## 2019-06-23 DIAGNOSIS — I953 Hypotension of hemodialysis: Secondary | ICD-10-CM

## 2019-06-23 DIAGNOSIS — N186 End stage renal disease: Secondary | ICD-10-CM

## 2019-06-23 DIAGNOSIS — I248 Other forms of acute ischemic heart disease: Secondary | ICD-10-CM

## 2019-06-23 DIAGNOSIS — R7989 Other specified abnormal findings of blood chemistry: Secondary | ICD-10-CM

## 2019-06-23 LAB — CBC
HCT: 41.4 % (ref 36.0–46.0)
Hemoglobin: 12.9 g/dL (ref 12.0–15.0)
MCH: 31.3 pg (ref 26.0–34.0)
MCHC: 31.2 g/dL (ref 30.0–36.0)
MCV: 100.5 fL — ABNORMAL HIGH (ref 80.0–100.0)
Platelets: 174 K/uL (ref 150–400)
RBC: 4.12 MIL/uL (ref 3.87–5.11)
RDW: 14.3 % (ref 11.5–15.5)
WBC: 11 K/uL — ABNORMAL HIGH (ref 4.0–10.5)
nRBC: 0 % (ref 0.0–0.2)

## 2019-06-23 LAB — VANCOMYCIN, RANDOM: Vancomycin Rm: 32

## 2019-06-23 LAB — BASIC METABOLIC PANEL WITH GFR
Anion gap: 16 — ABNORMAL HIGH (ref 5–15)
BUN: 31 mg/dL — ABNORMAL HIGH (ref 8–23)
CO2: 25 mmol/L (ref 22–32)
Calcium: 9.5 mg/dL (ref 8.9–10.3)
Chloride: 100 mmol/L (ref 98–111)
Creatinine, Ser: 9.08 mg/dL — ABNORMAL HIGH (ref 0.44–1.00)
GFR calc Af Amer: 4 mL/min — ABNORMAL LOW (ref >60)
GFR calc non Af Amer: 4 mL/min — ABNORMAL LOW (ref >60)
Glucose, Bld: 130 mg/dL — ABNORMAL HIGH (ref 70–99)
Potassium: 3.6 mmol/L (ref 3.5–5.1)
Sodium: 141 mmol/L (ref 135–145)

## 2019-06-23 LAB — TROPONIN I (HIGH SENSITIVITY)
Troponin I (High Sensitivity): 96 ng/L — ABNORMAL HIGH (ref <18)
Troponin I (High Sensitivity): 93 ng/L — ABNORMAL HIGH (ref <18)

## 2019-06-23 LAB — ECHOCARDIOGRAM COMPLETE
Height: 64 in
Weight: 3664 [oz_av]

## 2019-06-23 LAB — PROCALCITONIN: Procalcitonin: 2.04 ng/mL

## 2019-06-23 LAB — GLUCOSE, CAPILLARY
Glucose-Capillary: 133 mg/dL — ABNORMAL HIGH (ref 70–99)
Glucose-Capillary: 159 mg/dL — ABNORMAL HIGH (ref 70–99)
Glucose-Capillary: 151 mg/dL — ABNORMAL HIGH (ref 70–99)
Glucose-Capillary: 125 mg/dL — ABNORMAL HIGH (ref 70–99)

## 2019-06-23 LAB — LACTIC ACID, PLASMA
Lactic Acid, Venous: 1.9 mmol/L (ref 0.5–1.9)
Lactic Acid, Venous: 2.5 mmol/L (ref 0.5–1.9)

## 2019-06-23 MED ORDER — PERFLUTREN LIPID MICROSPHERE
1.0000 mL | INTRAVENOUS | Status: AC | PRN
  Administered 2019-06-23: 3 mL via INTRAVENOUS

## 2019-06-23 MED ORDER — FERRIC CITRATE 1 GM 210 MG(FE) PO TABS
420.0000 mg | ORAL_TABLET | Freq: Two times a day (BID) | ORAL | Status: DC
  Administered 2019-06-24: 420 mg via ORAL
  Filled 2019-06-23: qty 2

## 2019-06-23 MED ORDER — DOXERCALCIFEROL 4 MCG/2ML IV SOLN: 5.0000 ug | INTRAVENOUS | Status: DC

## 2019-06-23 MED ORDER — LORATADINE 10 MG PO TABS: 10.0000 mg | ORAL_TABLET | Freq: Every day | ORAL | Status: DC | PRN

## 2019-06-23 MED ORDER — MENTHOL 3 MG MT LOZG: 1.0000 | LOZENGE | OROMUCOSAL | Status: DC | PRN

## 2019-06-23 MED ORDER — VANCOMYCIN HCL IN DEXTROSE 750-5 MG/150ML-% IV SOLN
750.0000 mg | INTRAVENOUS | Status: DC
  Filled 2019-06-23: qty 150

## 2019-06-23 MED ORDER — VANCOMYCIN HCL IN DEXTROSE 1-5 GM/200ML-% IV SOLN: 1000.0000 mg | INTRAVENOUS | Status: DC

## 2019-06-23 MED ORDER — CHLORHEXIDINE GLUCONATE CLOTH 2 % EX PADS: 6.0000 | MEDICATED_PAD | Freq: Every day | CUTANEOUS | Status: DC

## 2019-06-23 NOTE — Plan of Care (Signed)
  Problem: Pain Managment: Goal: General experience of comfort will improve Outcome: Progressing   

## 2019-06-23 NOTE — Progress Notes (Signed)
If patient is medically cleared for discharge on 06/24/19, she has already been rescheduled for OP HD [facility] tomorrow, 06/24/19 at 11:30am. She needs to arrive at 11:10am. Please expedite am discharge in order for patient to get to OP HD clinic for treatment.  Alphonzo Cruise, [place] Renal Navigator [phone]

## 2019-06-23 NOTE — Consult Note (Addendum)
[facility] Renal Consultation Note    Indication for Consultation:  Management of ESRD/hemodialysis; anemia, hypertension/volume and secondary hyperparathyroidism  [name], [name], FNP  HPI: Jacqueline [name] is a 73 y.o. female. ESRD 2/2 DM2/HTN on HD TTS at [facility], first starting in 01/2014.  Past medical history significant for morbid obesity, chronic diastolic CHF, OSA, and CAD w/ NSTEMI s/p PTCA/DES x 1 mid RCA in 12/2016.   Patient was seen and examined at bedside.  Reports she was sent to the ED yesterday from dialysis.  States her BP dropped towards the end of treatment and she felt drained and weak.  Admits to vomiting x1 after eating a cracker the nurse gave her.  HD center reports patient altered and not as alert as normal so they felt she needed to be evaluated.  She believes she has gained body weight recently.  Says her BP is always low but has been dropping more frequently, she is now taking midodrine pre HD and half way through.  Review of outpatient records show patient has been mostly compliant with dialysis and typically meets or gets slightly under her estimated dry weight.  Today she is feeling much better.  Symptoms have resolved.  Denies SOB, CP, n/v/d, edema, fever, chills, weakness, dizziness and fatigue.   Pertinent findings since admission include troponin trending up, 43>58>96 and elevated lactic acid 5.6>3.2, WBC 15.2>11, negative COVID-19, and CXR with no acute findings.  Patient has been admitted for further evaluation and management.     Past Medical History:  Diagnosis Date  . Arthritis    "left knee" (12/04/2016)  . CAD (coronary artery disease)    a. 12/2016 NSTEMI/PCI: LM nl, LaD 20p/d, D1 20, OM2 20, OM3 20, RCA 59m (3.0x18 Resolute DES). EF 65%.  . Chronic lower back pain   . Diverticulosis    on CT  . ESRD on dialysis [facility])    "[facility]; [place]; TTS" (12/04/2016)  . GERD (gastroesophageal reflux disease)   . Hiatal  hernia   . Hyperlipidemia   . Hypertension    a. 12/2016 labetalol d/c'd 2/2 hypotension in HD.  . Morbid obesity ([place])   . Obesity   . Pneumonia    "couple times" (12/04/2016)  . Renal insufficiency   . Type II diabetes mellitus ([place])    Past Surgical History:  Procedure Laterality Date  . ABDOMINAL HYSTERECTOMY    . AV FISTULA PLACEMENT Left 01/27/2014   Procedure: ARTERIOVENOUS (AV) FISTULA CREATION;  Surgeon: Mal Misty, MD;  Location: [place];  Service: Vascular;  Laterality: Left;  . AV FISTULA REPAIR     "had it cleaned out"  . CARDIAC CATHETERIZATION  ?1980s  . CARDIAC CATHETERIZATION N/A 12/04/2016   Procedure: Left Heart Cath Angiography;  Surgeon: Burnell Blanks, MD;  Location: [place] CV LAB;  Service: Cardiovascular;  Laterality: N/A;  . CARDIAC CATHETERIZATION N/A 12/04/2016   Procedure: Coronary Stent Intervention;  Surgeon: Burnell Blanks, MD;  Location: [place] CV LAB;  Service: Cardiovascular;  Laterality: N/A;  Mid RCA  . CATARACT EXTRACTION W/ INTRAOCULAR LENS IMPLANT Left   . COLONOSCOPY W/ BIOPSIES AND POLYPECTOMY    . CORONARY ANGIOPLASTY WITH STENT PLACEMENT  12/04/2016  . ESOPHAGOGASTRODUODENOSCOPY    . INSERTION OF DIALYSIS CATHETER N/A 01/19/2014   Procedure: INSERTION OF DIALYSIS CATHETER;  Surgeon: Rosetta Posner, MD;  Location: [facility];  Service: Vascular;  Laterality: N/A;   Family History  Problem Relation Age of Onset  .  Diabetes Mother   . Peripheral vascular disease Mother        amputation  . CAD Father   . Heart disease Father   . Hypertension Father   . Heart attack Father   . CAD Sister   . Cancer Brother        x 2 brothers, type unknown  . Breast cancer Sister    Social History:  reports that she quit smoking about 35 years ago. Her smoking use included cigarettes. She has a 20.00 pack-year smoking history. She has never used smokeless tobacco. She reports that she does not drink alcohol or use drugs. Allergies   Allergen Reactions  . Sulfa Drugs Cross Reactors Other (See Comments)    Doesn't remember    Prior to Admission medications   Medication Sig Start Date End Date Taking? Authorizing Provider  aspirin EC 81 MG EC tablet Take 1 tablet (81 mg total) by mouth daily. 12/06/16  Yes Vann, Jessica U, DO  AURYXIA 1 GM 210 MG(Fe) tablet Take 420 mg by mouth 2 (two) times a day. 06/08/19  Yes [provider]  B Complex-C-Folic Acid (DIALYVITE 885) 0.8 MG TABS Take 1 tablet by mouth daily. 09/29/18  Yes [provider]  diclofenac sodium (VOLTAREN) 1 % GEL Apply 2 g topically 4 (four) times daily. Patient taking differently: Apply 2 g topically as needed (Shoulder and arms).  03/22/19  Yes [name], FNP  ezetimibe (ZETIA) 10 MG tablet TAKE 1 TABLET BY MOUTH EVERY DAY Patient taking differently: Take 10 mg by mouth daily.  06/10/19  Yes Turner, Eber Hong, MD  metoCLOPramide (REGLAN) 10 MG tablet TAKE 1 TABLET BY ORAL ROUTE 2 TIMES EVERY DAY 30 MINUTES BEFORE MEALS AND AT BEDTIME Patient taking differently: Take 10 mg by mouth daily.  03/18/19  Yes [name], FNP  midodrine (PROAMATINE) 10 MG tablet Take 10 mg by mouth 3 (three) times a week. Dialysis days [facility] Saturday   Yes [provider]  Omega-3 Fatty Acids (FISH OIL) 1000 MG CPDR Take 1,000 mg by mouth daily.    Yes [provider]  omeprazole (PRILOSEC) 20 MG capsule TAKE ONE CAPSULE EVERY DAY BEFORE MEALS Patient taking differently: Take 20 mg by mouth daily.  01/11/19  Yes [name], FNP  rosuvastatin (CRESTOR) 40 MG tablet TAKE 1 TABLET BY MOUTH DAILY Patient taking differently: Take 40 mg by mouth daily.  11/27/18  Yes Turner, Eber Hong, MD  Vitamin D, Ergocalciferol, 50 MCG (2000 UT) CAPS Take 2,000 Units by mouth daily.   Yes [provider]  benzoyl peroxide (BENZOYL PEROXIDE) 5 % external liquid Apply topically 2 (two) times daily. Patient not taking: Reported on 06/22/2019 11/20/18   [name], FNP  nitroGLYCERIN (NITROSTAT) 0.4 MG SL tablet Place 1 tablet (0.4 mg total) under the tongue every 5 (five) minutes as needed for chest pain. 12/09/16 06/22/19  Sueanne Margarita, MD   Current Facility-Administered Medications  Medication Dose Route Frequency Provider Last Rate Last Dose  . 0.9 %  sodium chloride infusion  250 mL Intravenous PRN Merton Border, MD      . aspirin EC tablet 81 mg  81 mg Oral Daily Merton Border, MD   81 mg at 06/23/19 0810  . ceFEPIme (MAXIPIME) 1 g in sodium chloride 0.9 % 100 mL IVPB  1 g Intravenous Q24H Merton Border, MD      . cholecalciferol (VITAMIN D3) tablet 2,000 Units  2,000 Units Oral Daily  Merton Border, MD   2,000 Units at 06/23/19 [phone]  . ezetimibe (ZETIA) tablet 10 mg  10 mg Oral Daily Merton Border, MD   10 mg at 06/23/19 0810  . ferric citrate (AURYXIA) tablet 420 mg  420 mg Oral BID Merton Border, MD   420 mg at 06/23/19 0811  . heparin injection 5,000 Units  5,000 Units Subcutaneous Q12H Merton Border, MD   5,000 Units at 06/23/19 [phone]  . insulin aspart (novoLOG) injection 0-5 Units  0-5 Units Subcutaneous QHS Merton Border, MD      . insulin aspart (novoLOG) injection 0-9 Units  0-9 Units Subcutaneous TID WC Merton Border, MD   2 Units at 06/23/19 1145  . loratadine (CLARITIN) tablet 10 mg  10 mg Oral Daily PRN Alma Friendly, MD      . menthol-cetylpyridinium (CEPACOL) lozenge 3 mg  1 lozenge Oral PRN Alma Friendly, MD      . metoCLOPramide (REGLAN) tablet 10 mg  10 mg Oral Daily Merton Border, MD   10 mg at 06/23/19 0810  . [START ON 06/24/2019] midodrine (PROAMATINE) tablet 10 mg  10 mg Oral Q T,Th,Sa-HD Merton Border, MD      . nitroGLYCERIN (NITROSTAT) SL tablet 0.4 mg  0.4 mg Sublingual Q5 min PRN Merton Border, MD      . ondansetron (ZOFRAN) tablet 4 mg  4 mg Oral Q6H PRN Merton Border, MD       Or  . ondansetron (ZOFRAN) injection 4 mg  4 mg Intravenous Q6H PRN Merton Border, MD      . pantoprazole (PROTONIX) EC tablet 40 mg  40 mg Oral Daily  Merton Border, MD   40 mg at 06/23/19 0811  . rosuvastatin (CRESTOR) tablet 40 mg  40 mg Oral Daily Merton Border, MD   40 mg at 06/23/19 0811  . sodium chloride flush (NS) 0.9 % injection 3 mL  3 mL Intravenous Q12H Merton Border, MD   3 mL at 06/23/19 0813  . sodium chloride flush (NS) 0.9 % injection 3 mL  3 mL Intravenous PRN Merton Border, MD      . Derrill Memo ON 06/26/2019] vancomycin (VANCOCIN) IVPB 1000 mg/200 mL premix  1,000 mg Intravenous Q T,Th,Sa-HD Pierce, Dwayne A, RPH      . [START ON 06/24/2019] vancomycin (VANCOCIN) IVPB 750 mg/150 ml premix  750 mg Intravenous Q T,Th,Sa-HD Theotis Burrow, [facility]       Labs: Basic Metabolic Panel: Recent Labs  Lab 06/22/19 1138 06/23/19 0558  NA 139 141  K 3.1* 3.6  CL 95* 100  CO2 23 25  GLUCOSE 219* 130*  BUN 20 31*  CREATININE 7.44* 9.08*  CALCIUM 9.3 9.5   Liver Function Tests: Recent Labs  Lab 06/22/19 1138  AST 47*  ALT 46*  ALKPHOS 86  BILITOT 0.8  PROT 8.0  ALBUMIN 3.5   CBC: Recent Labs  Lab 06/22/19 1138 06/23/19 0558  WBC 15.2* 11.0*  NEUTROABS 12.2*  --   HGB 14.5 12.9  HCT 48.1* 41.4  MCV 103.9* 100.5*  PLT 204 174  CBG: Recent Labs  Lab 06/22/19 1620 06/22/19 2029 06/23/19 0653 06/23/19 1138  GLUCAP 157* 154* 133* 159*   Studies/Results: Dg Chest Portable 1 View  Result Date: 06/22/2019 CLINICAL DATA:  Hypotension. Weakness and fatigue during dialysis today. EXAM: PORTABLE CHEST 1 VIEW COMPARISON:  12/31/2016. FINDINGS: The cardiac silhouette remains borderline enlarged. Clear lungs with normal vascularity. Stable mildly prominent interstitial markings. Thoracic and cervical  spine degenerative changes. IMPRESSION: No acute abnormality. Stable mild chronic interstitial lung disease. Electronically Signed   By: Claudie Revering M.D.   On: 06/22/2019 12:09    ROS: All others negative except those listed in HPI.  Physical Exam: Vitals:   06/22/19 1442 06/22/19 2031 06/23/19 0455 06/23/19 0849  BP: (!) 102/44  (!) 125/53 (!) 124/58 130/62  Pulse: 68 69 65 69  Resp: 18 18 19    Temp:  98.3 F (36.8 C) 98.4 F (36.9 C) 98.2 F (36.8 C)  TempSrc:  Oral Oral   SpO2: 100% 95% 99% 100%  Weight:      Height:         General: WDWN, obese female in NAD, sitting in bedside chair Head: NCAT sclera not icteric  Neck: Supple. No lymphadenopathy Lungs: mostly CTA bilaterally. Breath sounds diminished at bases. No wheeze, rales or rhonchi. Breathing is unlabored. Heart: RRR.  Hard to appreciate d/t body habitus. No murmur, rubs or gallops.  Abdomen: soft, obese, nontender, +BS, no guarding, no rebound tenderness Lower extremities:trace edema, no ischemic changes, or open wounds  Neuro: AAOx3. Moves all extremities spontaneously. Psych:  Responds to questions appropriately with a normal affect. Dialysis Access: :LU AVF +b/t  Dialysis Orders:  [facility]  3.5hrs, BFR 400, DFR 800,  EDW 114.5kg, 2K/ 2.25Ca  Access: LU AVF  Heparin 6000 unit bolus Hectorol 56mcg IV qHD    Assessment/Plan: 1.  Elevated Troponin in known Hx CAD- ECHO pending.  Per cardio 2.  ESRD -  On HD TTS.  Orders written for HD tomorrow per regular schedule.   3.  Hypotension - chronic issue.  On midodrine with HD.   4.  Volume  - Does not appear volume overloaded on exam.  HD with UF to dry tomorrow.  5.  Anemia of CKD - Hgb 12.9. No indication for ESA at this time.  6.  Secondary Hyperparathyroidism -  Ca in goal. Will check phos. Continue VDRA and binders - auryxia. 7.  Nutrition - Renal diet w/fluid restrictions.    Jen Mow, PA-C [facility] Pager: [phone] 06/23/2019, 2:50 PM   Pt seen, examined and agree w A/P as above.  Kelly Splinter  MD 06/23/2019, 4:40 PM

## 2019-06-23 NOTE — Consult Note (Addendum)
Cardiology Consultation:   Patient ID: Jacqueline [name] MRN: [id number]; DOB: 09/12/1946  Admit date: 06/22/2019 Date of Consult: 06/23/2019   Primary Care Provider: [name], FNP Primary Cardiologist: Fransico Him, MD  Primary Electrophysiologist:  None    Patient Profile:   Jacqueline [name] is a 73 y.o. female with a hx of CAD s/p NSTEMI PCI->RCA '18, ESRD on HD, HL, HTN, DM who is being seen today for the evaluation of elevated troponin at the request of Dr. Horris Latino.  History of Present Illness:   Jacqueline [name] is a 73 yo female with PMH noted above. Presented back in 2018 with an NSTEMI underwent cath with PCI/DES to the RCA, residual disease LAD 20%, OM2/3 20%, D1 20%. Has trouble tolerating medical therapy with labetalol 2/2 to issues with hypotension and HD. EF noted at 65%. She was last seen in follow up at the office on 1/20 with Dr. Radford Pax. Denied any anginal symptoms. Continued on ASA and statin, no BB with hypotension and reported bradycardia.   Has struggled with hypotension for several years, and take midodrine on her HD days. She currently lives at home. Does household chores without significant anginal symptoms. Does HD on TTS. Went yesterday on her usual state of health.  Was able to sit for 3 1/2 hours then developed hypotension and felt fatigued. No chest pain, shortness of breath, n/v. Systolic BP noted in the 30Z. She was brought to the ED for further evaluation.   Labs in the ED showed K+ 3.1, HsTrop 43>>58>>96>>96, Lactic Acid 5.6>>3.2, WBC 15.2. CXR without acute findings. EKG showed SR with nonspecific TWI, no acute ischemia. She was admitted for sepsis and placed on IV antibiotics. Admitted to IM for further work up. Given 500cc fluids while in the ED with blood pressures improved. No further symptoms since admission.     Heart Pathway Score:     Past Medical History:  Diagnosis Date   Arthritis    "left knee" (12/04/2016)   CAD (coronary artery  disease)    a. 12/2016 NSTEMI/PCI: LM nl, LaD 20p/d, D1 20, OM2 20, OM3 20, RCA 87m (3.0x18 Resolute DES). EF 65%.   Chronic lower back pain    Diverticulosis    on CT   ESRD on dialysis [facility])    "[place]; [place]; TTS" (12/04/2016)   GERD (gastroesophageal reflux disease)    Hiatal hernia    Hyperlipidemia    Hypertension    a. 12/2016 labetalol d/c'd 2/2 hypotension in HD.   Morbid obesity ([place])    Obesity    Pneumonia    "couple times" (12/04/2016)   Renal insufficiency    Type II diabetes mellitus ([place])     Past Surgical History:  Procedure Laterality Date   ABDOMINAL HYSTERECTOMY     AV FISTULA PLACEMENT Left 01/27/2014   Procedure: ARTERIOVENOUS (AV) FISTULA CREATION;  Surgeon: Mal Misty, MD;  Location: [place];  Service: Vascular;  Laterality: Left;   AV FISTULA REPAIR     "had it cleaned out"   CARDIAC CATHETERIZATION  ?[place] N/A 12/04/2016   Procedure: Left Heart Cath Angiography;  Surgeon: Burnell Blanks, MD;  Location: [place] CV LAB;  Service: Cardiovascular;  Laterality: N/A;   CARDIAC CATHETERIZATION N/A 12/04/2016   Procedure: Coronary Stent Intervention;  Surgeon: Burnell Blanks, MD;  Location: [place] CV LAB;  Service: Cardiovascular;  Laterality: N/A;  Mid RCA   CATARACT EXTRACTION W/ INTRAOCULAR LENS IMPLANT Left  COLONOSCOPY W/ BIOPSIES AND POLYPECTOMY     CORONARY ANGIOPLASTY WITH STENT PLACEMENT  12/04/2016   ESOPHAGOGASTRODUODENOSCOPY     INSERTION OF DIALYSIS CATHETER N/A 01/19/2014   Procedure: INSERTION OF DIALYSIS CATHETER;  Surgeon: Rosetta Posner, MD;  Location: [facility] OR;  Service: Vascular;  Laterality: N/A;     Home Medications:  Prior to Admission medications   Medication Sig Start Date End Date Taking? Authorizing Provider  aspirin EC 81 MG EC tablet Take 1 tablet (81 mg total) by mouth daily. 12/06/16  Yes Vann, Jessica U, DO  AURYXIA 1 GM 210 MG(Fe) tablet Take 420  mg by mouth 2 (two) times a day. 06/08/19  Yes [provider]  B Complex-C-Folic Acid (DIALYVITE 502) 0.8 MG TABS Take 1 tablet by mouth daily. 09/29/18  Yes [provider]  diclofenac sodium (VOLTAREN) 1 % GEL Apply 2 g topically 4 (four) times daily. Patient taking differently: Apply 2 g topically as needed (Shoulder and arms).  03/22/19  Yes [name], FNP  ezetimibe (ZETIA) 10 MG tablet TAKE 1 TABLET BY MOUTH EVERY DAY Patient taking differently: Take 10 mg by mouth daily.  06/10/19  Yes Turner, Eber Hong, MD  metoCLOPramide (REGLAN) 10 MG tablet TAKE 1 TABLET BY ORAL ROUTE 2 TIMES EVERY DAY 30 MINUTES BEFORE MEALS AND AT BEDTIME Patient taking differently: Take 10 mg by mouth daily.  03/18/19  Yes [name], FNP  midodrine (PROAMATINE) 10 MG tablet Take 10 mg by mouth 3 (three) times a week. Dialysis days [place] Saturday   Yes [provider]  Omega-3 Fatty Acids (FISH OIL) 1000 MG CPDR Take 1,000 mg by mouth daily.    Yes [provider]  omeprazole (PRILOSEC) 20 MG capsule TAKE ONE CAPSULE EVERY DAY BEFORE MEALS Patient taking differently: Take 20 mg by mouth daily.  01/11/19  Yes [name], FNP  rosuvastatin (CRESTOR) 40 MG tablet TAKE 1 TABLET BY MOUTH DAILY Patient taking differently: Take 40 mg by mouth daily.  11/27/18  Yes Turner, Eber Hong, MD  Vitamin D, Ergocalciferol, 50 MCG (2000 UT) CAPS Take 2,000 Units by mouth daily.   Yes [provider]  benzoyl peroxide (BENZOYL PEROXIDE) 5 % external liquid Apply topically 2 (two) times daily. Patient not taking: Reported on 06/22/2019 11/20/18   [name], FNP  nitroGLYCERIN (NITROSTAT) 0.4 MG SL tablet Place 1 tablet (0.4 mg total) under the tongue every 5 (five) minutes as needed for chest pain. 12/09/16 06/22/19  Sueanne Margarita, MD    Inpatient Medications: Scheduled Meds:  aspirin EC  81 mg Oral Daily   cholecalciferol  2,000 Units Oral Daily   ezetimibe  10 mg Oral Daily    ferric citrate  420 mg Oral BID   heparin  5,000 Units Subcutaneous Q12H   insulin aspart  0-5 Units Subcutaneous QHS   insulin aspart  0-9 Units Subcutaneous TID WC   metoCLOPramide  10 mg Oral Daily   [START ON 06/24/2019] midodrine  10 mg Oral Q T,Th,Sa-HD   pantoprazole  40 mg Oral Daily   rosuvastatin  40 mg Oral Daily   sodium chloride flush  3 mL Intravenous Q12H   Continuous Infusions:  sodium chloride     ceFEPime (MAXIPIME) IV     [START ON 06/26/2019] vancomycin     [START ON 06/24/2019] vancomycin     PRN Meds: sodium chloride, loratadine, menthol-cetylpyridinium, nitroGLYCERIN, ondansetron **OR** ondansetron (ZOFRAN) IV, sodium chloride flush  Allergies:  Allergies  Allergen Reactions   Sulfa Drugs Cross Reactors Other (See Comments)    Doesn't remember     Social History:   Social History   Socioeconomic History   Marital status: Married    Spouse name: Not on file   Number of children: 4   Years of education: Not on file   Highest education level: Not on file  Occupational History   Occupation: retired  Scientist, product/process development strain: Not hard at all   Food insecurity    Worry: Never true    Inability: Never true   Transportation needs    Medical: No    Non-medical: No  Tobacco Use   Smoking status: Former Smoker    Packs/day: 1.00    Years: 20.00    Pack years: 20.00    Types: Cigarettes    Quit date: 1985    Years since quitting: 35.5   Smokeless tobacco: Never Used  Substance and Sexual Activity   Alcohol use: No    Alcohol/week: 0.0 standard drinks   Drug use: No   Sexual activity: Not Currently  Lifestyle   Physical activity    Days per week: 7 days    Minutes per session: 10 min   Stress: Not at all  Relationships   Social connections    Talks on phone: Not on file    Gets together: Not on file    Attends religious service: Not on file    Active member of club or organization: Not on  file    Attends meetings of clubs or organizations: Not on file    Relationship status: Not on file   Intimate partner violence    Fear of current or ex partner: No    Emotionally abused: No    Physically abused: No    Forced sexual activity: No  Other Topics Concern   Not on file  Social History Narrative   Not on file    Family History:    Family History  Problem Relation Age of Onset   Diabetes Mother    Peripheral vascular disease Mother        amputation   CAD Father    Heart disease Father    Hypertension Father    Heart attack Father    CAD Sister    Cancer Brother        x 2 brothers, type unknown   Breast cancer Sister      ROS:  Please see the history of present illness.   All other ROS reviewed and negative.     Physical Exam/Data:   Vitals:   06/22/19 1442 06/22/19 2031 06/23/19 0455 06/23/19 0849  BP: (!) 102/44 (!) 125/53 (!) 124/58 130/62  Pulse: 68 69 65 69  Resp: 18 18 19    Temp:  98.3 F (36.8 C) 98.4 F (36.9 C) 98.2 F (36.8 C)  TempSrc:  Oral Oral   SpO2: 100% 95% 99% 100%  Weight:      Height:        Intake/Output Summary (Last 24 hours) at 06/23/2019 1248 Last data filed at 06/23/2019 0455 Gross per 24 hour  Intake 560 ml  Output 0 ml  Net 560 ml   Last 3 Weights 06/22/2019 05/12/2019 03/22/2019  Weight (lbs) 229 lb 229 lb 249 lb 3.2 oz  Weight (kg) 103.874 kg 103.874 kg 113.036 kg     Body mass index is 39.31 kg/m.  General:  Well nourished, obese AAF,  in no acute distress HEENT: normal Lymph: no adenopathy Neck: no JVD Vascular: No carotid bruits Cardiac:  normal S1, S2; RRR; no murmur  Lungs:  clear to auscultation bilaterally, no wheezing, rhonchi or rales  Abd: soft, nontender, Ext: no edema, LLE fistula Musculoskeletal:  No deformities Skin: warm and dry  Neuro:  CNs 2-12 intact, no focal abnormalities noted Psych:  Normal affect   EKG:  The EKG was personally reviewed and demonstrates:  06/22/19 SR with  nonspecific TWI, peaked T waves. Telemetry:  Telemetry was personally reviewed and demonstrates:  SR with intermittent 1st degree AVB.   Relevant CV Studies:  TTE: 2015   Study Conclusions   - Left ventricle: The cavity size was normal. There was mild  concentric hypertrophy. Systolic function was normal. The  estimated ejection fraction was in the range of 55% to  60%. Wall motion was normal; there were no regional wall  motion abnormalities. Left ventricular diastolic function  parameters were normal.  - Mitral valve: Calcified annulus.  - Left atrium: The atrium was mildly dilated.  - Right ventricle: The cavity size was mildly dilated. Wall  thickness was normal.  - Right atrium: The atrium was mildly dilated.  - Atrial septum: No defect or patent foramen ovale was  identified.   Cath: 2018   The left ventricular ejection fraction is greater than 65% by visual estimate.  The left ventricular systolic function is normal.  LV end diastolic pressure is normal.  There is no mitral valve regurgitation.  2nd Mrg lesion, 20 %stenosed.  3rd Mrg lesion, 20 %stenosed.  1st Diag lesion, 20 %stenosed.  Prox LAD to Dist LAD lesion, 20 %stenosed.  Mid RCA to Dist RCA lesion, 20 %stenosed.  A STENT RESOLUTE ONYX 3.0X18 drug eluting stent was successfully placed.  Mid RCA lesion, 80 %stenosed.  Post intervention, there is a 0% residual stenosis.   1. NSTEMI secondary to ulcerated plaque in the mid RCA 2. Mild disease in the LAD and Circumflex 3. Normal LV systolic function 4. Successful PTCA/DES x 1 mid RCA  Recommendations: Will continue DAPT with ASA and Brilinta for at least one year. Will start high dose statin. Beta blocker as tolerated.   Diagnostic     Intervention Dominance: Right      Laboratory Data:  High Sensitivity Troponin:   Recent Labs  Lab 06/22/19 1550 06/22/19 1809 06/23/19 0803 06/23/19 0930  TROPONINIHS 43* 58* 96* 93*      Cardiac EnzymesNo results for input(s): TROPONINI in the last 168 hours. No results for input(s): TROPIPOC in the last 168 hours.  Chemistry Recent Labs  Lab 06/22/19 1138 06/23/19 0558  NA 139 141  K 3.1* 3.6  CL 95* 100  CO2 23 25  GLUCOSE 219* 130*  BUN 20 31*  CREATININE 7.44* 9.08*  CALCIUM 9.3 9.5  GFRNONAA 5* 4*  GFRAA 6* 4*  ANIONGAP 21* 16*    Recent Labs  Lab 06/22/19 1138  PROT 8.0  ALBUMIN 3.5  AST 47*  ALT 46*  ALKPHOS 86  BILITOT 0.8   Hematology Recent Labs  Lab 06/22/19 1138 06/23/19 0558  WBC 15.2* 11.0*  RBC 4.63 4.12  HGB 14.5 12.9  HCT 48.1* 41.4  MCV 103.9* 100.5*  MCH 31.3 31.3  MCHC 30.1 31.2  RDW 14.1 14.3  PLT 204 174   BNPNo results for input(s): BNP, PROBNP in the last 168 hours.  DDimer No results for input(s): DDIMER in the last 168 hours.  Radiology/Studies:  Dg Chest Portable 1 View  Result Date: 06/22/2019 CLINICAL DATA:  Hypotension. Weakness and fatigue during dialysis today. EXAM: PORTABLE CHEST 1 VIEW COMPARISON:  12/31/2016. FINDINGS: The cardiac silhouette remains borderline enlarged. Clear lungs with normal vascularity. Stable mildly prominent interstitial markings. Thoracic and cervical spine degenerative changes. IMPRESSION: No acute abnormality. Stable mild chronic interstitial lung disease. Electronically Signed   By: Claudie Revering M.D.   On: 06/22/2019 12:09    Assessment and Plan:   AMBERLE LYTER is a 73 y.o. female with a hx of CAD s/p NSTEMI PCI->RCA '18, ESRD on HD, HL, HTN, DM who is being seen today for the evaluation of elevated troponin at the request of Dr. Horris Latino.  1. Elevated Troponin: She denies any anginal symptoms prior to or since admission. HsT with flat non ACS trend. EKG without new ischemic changes. Suspect this was demand ischemia in the setting of hypotension, systolic BP in the 97Q with HD. Also work up in progress for possible sepsis. -- will check echo for WMA   2. CAD s/p RCA DES  '18: has been on ASA and statin PTA. No BB 2/2 to issues with hypotension and bradycardia.   3. ESRD on HD: TTS schedule.  4. Hypotension: hx of the same with HD days. Takes midodrine on HD days to aid BP.   5. Sepsis: Lactic elevated on admission, but now trending down. Started on IV antibiotics per primary. Work up in progress.     Signed, Reino Bellis, NP  06/23/2019 12:48 PM  ATTENDING ATTESTATION  I have seen, examined and evaluated the patient this PM along with Reino Bellis, NP-C.  After reviewing all the available data and chart, we discussed the patients laboratory, study & physical findings as well as symptoms in detail. I agree with her findings, examination as well as impression recommendations as per our discussion.    Patient was admitted with hypotension in the setting of hemodialysis.  Flat at bedtime troponin level less than 100.  Not consistent with ACS.  This is more consistent with demand ischemia and troponin elevation secondary to renal insufficiency.  With her having labile blood pressures, not unreasonable to check an echocardiogram to exclude wall motion normality.  As long as this looks relatively stable, I would not recommend ischemic evaluation this time.  Can defer to primary cardiologist in the outpatient setting.  Will await results of echocardiogram and comment, but with no active cardiac symptoms for now unless the echo is abnormal will sign off.    Glenetta Hew, M.D., M.S. Interventional Cardiologist   Pager # [phone] Phone # [phone] [address]. [place], [place] [address]      For questions or updates, please contact [place] Please consult [email or web address] for contact info under

## 2019-06-23 NOTE — Progress Notes (Signed)
Pharmacy Antibiotic Note  Jacqueline [name] is a 73 y.o. female admitted on 06/22/2019 with sepsis.  Pharmacy has been consulted for vancomycin and cefepime dosing. Per pt, goes to HD T, Th and Sat. Last HD 7/21 and completed treatment (2.2 L removed). Tmax 98.2 and WBC 11. PreHD level 32 mcg/ml. Vanc load of 2 grams given but not charted.  Plan: Due to post-load level, will give Vancomycin 750 mg after dialysis on Thursday 7/23 and will resume 1 gram dose on Saturday. Continue cefepime 1g IV q24h Monitor HD plans and clinical progression F/u C&S  Height: 5\' 4"  (162.6 cm) Weight: 229 lb (103.9 kg) IBW/kg (Calculated) : 54.7  Temp (24hrs), Avg:98.3 F (36.8 C), Min:98.2 F (36.8 C), Max:98.4 F (36.9 C)  Recent Labs  Lab 06/22/19 1138 06/22/19 1200 06/22/19 1330 06/23/19 0558 06/23/19 0803 06/23/19 0919 06/23/19 0930  WBC 15.2*  --   --  11.0*  --   --   --   CREATININE 7.44*  --   --  9.08*  --   --   --   LATICACIDVEN  --  5.6* 3.2*  --  1.9  --  2.5*  VANCORANDOM  --   --   --   --   --  32  --     Estimated Creatinine Clearance: 6.3 mL/min (A) (by C-G formula based on SCr of 9.08 mg/dL (H)).    Allergies  Allergen Reactions  . Sulfa Drugs Cross Reactors Other (See Comments)    Doesn't remember     Antimicrobials this admission: Vancomycin 7/21 >>  Cefepime 7/21 >>  Metronidazole x1 7/21  Dose adjustments this admission: n/a  Microbiology results: pending  Thank you for allowing pharmacy to be a part of this patient's care.  Jrue Jarriel A. Levada Dy, PharmD, [place] Pager: [phone] Please utilize Amion for appropriate phone number to reach the unit pharmacist ([place])

## 2019-06-23 NOTE — Progress Notes (Signed)
PROGRESS NOTE  Jacqueline [name] WLS:[id number] DOB: 12-Aug-1946 DOA: 06/22/2019 PCP: [name], FNP  HPI/Recap of past 24 hours: HPI from Dr Shirlee Limerick  is a 73 y.o. 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  Patient feels fatigued, no chest pains, shortness of breath, nausea, vomiting or diarrhea.  Patient received 500 cc normal saline bolus in the emergency room and the blood pressure started to pick up.  No report of fever or chills. In the emergency room the patient was found to have leukocytosis with elevated lactic acid at 5.6.  Her EKG was normal and so was her temperature. Patient started on IV antibiotics and cultures were drawn in the emergency room.   Today, patient denies any new complaints, reports some sore throat, sneezing (reports a hx of allergies).  Patient denies any fever/chills, chest pain, shortness of breath, abdominal pain, nausea/vomiting.  Assessment/Plan: Active Problems:   Sepsis ([place])  Possible sepsis Unknown etiology Hypotension, leukocytosis, lactic acidosis on presentation Currently afebrile, with leukocytosis Lactic acidosis 5.6--> 2.5, will trend (likely due to hypotension) Procalcitonin 2.04, will trend BC x2 pending Chest x-ray unremarkable UA not done as patient is anuric Continue broad-spectrum antibiotic, IV Vanco + IV cefepime until blood culture is negative x48 hours Monitor closely  Hypotension Resolved Chronic issue likely 2/2 HD Continue Midodrine Monitor closely  Elevated troponin/CAD status post DES in 2018 Denies any chest pain, shortness of breath Likely due to demand ischemia from hypotension Flat trend, EKG with no acute ST changes Echo pending Cardiology on board Continue aspirin, statin   Diabetes mellitus type 2 Continue SSI, Accu-Cheks, hypoglycemic protocol  End-stage renal disease on HD TTS schedule Nephrology consulted, will see patient if remains beyond tomorrow  Hyperlipidemia Continue statins  Obesity Lifestyle modification advised       Malnutrition Type:      Malnutrition Characteristics:      Nutrition Interventions:       Estimated body mass index is 39.31 kg/m as calculated from the following:   Height as of this encounter: 5\' 4"  (1.626 m).   Weight as of this encounter: 103.9 kg.     Code Status: Full  Family Communication: Discussed with patient  Disposition Plan: To be determined   Consultants:  Cardiology  Nephrology  Procedures:  None  Antimicrobials:  Vancomycin  Cefepime  DVT prophylaxis: Heparin   Objective: Vitals:   06/22/19 1442 06/22/19 2031 06/23/19 0455 06/23/19 0849  BP: (!) 102/44 (!) 125/53 (!) 124/58 130/62  Pulse: 68 69 65 69  Resp: 18 18 19    Temp:  98.3 F (36.8 C) 98.4 F (36.9 C) 98.2 F (36.8 C)  TempSrc:  Oral Oral   SpO2: 100% 95% 99% 100%  Weight:      Height:        Intake/Output Summary (Last 24 hours) at 06/23/2019 1425 Last data filed at 06/23/2019 0455 Gross per 24 hour  Intake 60 ml  Output 0 ml  Net 60 ml   Filed Weights   06/22/19 1200  Weight: 103.9 kg    Exam:  General: NAD   Cardiovascular: S1, S2 present  Respiratory: CTAB  Abdomen: Soft, nontender, nondistended, bowel sounds present  Musculoskeletal: No bilateral pedal edema noted, LUE fistula  Skin: Normal  Psychiatry: Normal mood  Data Reviewed: CBC: Recent Labs  Lab 06/22/19 1138 06/23/19 0558  WBC 15.2* 11.0*  NEUTROABS 12.2*  --   HGB 14.5 12.9  HCT 48.1* 41.4  MCV 103.9* 100.5*  PLT 204 510   Basic Metabolic Panel: Recent Labs  Lab 06/22/19 1138 06/23/19 0558  NA 139 141  K 3.1* 3.6  CL 95* 100  CO2 23 25  GLUCOSE 219* 130*  BUN 20 31*  CREATININE 7.44* 9.08*   CALCIUM 9.3 9.5   GFR: Estimated Creatinine Clearance: 6.3 mL/min (A) (by C-G formula based on SCr of 9.08 mg/dL (H)). Liver Function Tests: Recent Labs  Lab 06/22/19 1138  AST 47*  ALT 46*  ALKPHOS 86  BILITOT 0.8  PROT 8.0  ALBUMIN 3.5   No results for input(s): LIPASE, AMYLASE in the last 168 hours. No results for input(s): AMMONIA in the last 168 hours. Coagulation Profile: Recent Labs  Lab 06/22/19 1330  INR 1.1   Cardiac Enzymes: No results for input(s): CKTOTAL, CKMB, CKMBINDEX, TROPONINI in the last 168 hours. BNP (last 3 results) No results for input(s): PROBNP in the last 8760 hours. HbA1C: No results for input(s): HGBA1C in the last 72 hours. CBG: Recent Labs  Lab 06/22/19 1620 06/22/19 2029 06/23/19 0653 06/23/19 1138  GLUCAP 157* 154* 133* 159*   Lipid Profile: No results for input(s): CHOL, HDL, LDLCALC, TRIG, CHOLHDL, LDLDIRECT in the last 72 hours. Thyroid Function Tests: No results for input(s): TSH, T4TOTAL, FREET4, T3FREE, THYROIDAB in the last 72 hours. Anemia Panel: No results for input(s): VITAMINB12, FOLATE, FERRITIN, TIBC, IRON, RETICCTPCT in the last 72 hours. Urine analysis:    Component Value Date/Time   COLORURINE YELLOW 01/25/2014 1903   APPEARANCEUR TURBID (A) 01/25/2014 1903   LABSPEC 1.020 01/25/2014 1903   PHURINE 5.0 01/25/2014 1903   GLUCOSEU NEGATIVE 01/25/2014 1903   HGBUR LARGE (A) 01/25/2014 1903   BILIRUBINUR SMALL (A) 01/25/2014 1903   KETONESUR NEGATIVE 01/25/2014 1903   PROTEINUR 100 (A) 01/25/2014 1903   UROBILINOGEN 1.0 01/25/2014 1903   NITRITE NEGATIVE 01/25/2014 1903   LEUKOCYTESUR LARGE (A) 01/25/2014 1903   Sepsis Labs: @LABRCNTIP (procalcitonin:4,lacticidven:4)  ) Recent Results (from the past 240 hour(s))  Blood Culture (routine x 2)     Status: None (Preliminary result)   Collection Time: 06/22/19  1:30 PM   Specimen: BLOOD RIGHT ARM  Result Value Ref Range Status   Specimen Description BLOOD  RIGHT ARM  Final   Special Requests   Final    BOTTLES DRAWN AEROBIC AND ANAEROBIC Blood Culture adequate volume   Culture   Final    NO GROWTH < 24 HOURS Performed at [facility], [place] [address] [place], [place] [address]    Report Status PENDING  Incomplete  SARS Coronavirus 2 (CEPHEID - Performed in [place] hospital lab), Hosp Order     Status: None   Collection Time: 06/22/19  1:50 PM   Specimen: Nasopharyngeal Swab  Result Value Ref Range Status   SARS Coronavirus 2 NEGATIVE NEGATIVE Final    Comment: (NOTE) If result is NEGATIVE SARS-CoV-2 target nucleic acids are NOT DETECTED. The SARS-CoV-2 RNA is generally detectable in upper and lower  respiratory specimens during the acute phase of infection. The lowest  concentration of SARS-CoV-2 viral copies this assay can detect is 250  copies / mL. A negative result does not preclude SARS-CoV-2 infection  and should not be used as the sole basis for treatment or other  patient management decisions.  A negative result may occur with  improper specimen collection / handling, submission of specimen other  than nasopharyngeal swab, presence of viral mutation(s) within the  areas targeted by this assay, and inadequate number of viral copies  (<250 copies / mL). A negative result must be combined with clinical  observations, patient history, and epidemiological information. If result is POSITIVE SARS-CoV-2 target nucleic acids are DETECTED. The SARS-CoV-2 RNA is generally detectable in upper and lower  respiratory specimens dur ing the acute phase of infection.  Positive  results are indicative of active infection with SARS-CoV-2.  Clinical  correlation with patient history and other diagnostic information is  necessary to determine patient infection status.  Positive results do  not rule out bacterial infection or co-infection with other viruses. If result is PRESUMPTIVE POSTIVE SARS-CoV-2 nucleic acids MAY BE PRESENT.   A  presumptive positive result was obtained on the submitted specimen  and confirmed on repeat testing.  While 2019 novel coronavirus  (SARS-CoV-2) nucleic acids may be present in the submitted sample  additional confirmatory testing may be necessary for epidemiological  and / or clinical management purposes  to differentiate between  SARS-CoV-2 and other Sarbecovirus currently known to infect humans.  If clinically indicated additional testing with an alternate test  methodology [phone]) is advised. The SARS-CoV-2 RNA is generally  detectable in upper and lower respiratory sp ecimens during the acute  phase of infection. The expected result is Negative. Fact Sheet for Patients:  StrictlyIdeas.no Fact Sheet for Healthcare Providers: BankingDealers.co.za This test is not yet approved or cleared by the [place] FDA and has been authorized for detection and/or diagnosis of SARS-CoV-2 by FDA under an Emergency Use Authorization (EUA).  This EUA will remain in effect (meaning this test can be used) for the duration of the COVID-19 declaration under Section 564(b)(1) of the Act, 21 U.S.C. section 360bbb-3(b)(1), unless the authorization is terminated or revoked sooner. Performed at [facility], [place] [address], [place], [place] [address]   Blood Culture (routine x 2)     Status: None (Preliminary result)   Collection Time: 06/22/19  3:50 PM   Specimen: BLOOD RIGHT HAND  Result Value Ref Range Status   Specimen Description BLOOD RIGHT HAND  Final   Special Requests   Final    BOTTLES DRAWN AEROBIC ONLY Blood Culture adequate volume   Culture   Final    NO GROWTH < 24 HOURS Performed at [facility], [place] [address], [place], [place] [address]    Report Status PENDING  Incomplete      Studies: No results found.  Scheduled Meds: . aspirin EC  81 mg Oral Daily  . cholecalciferol  2,000 Units Oral Daily  . ezetimibe  10 mg  Oral Daily  . ferric citrate  420 mg Oral BID  . heparin  5,000 Units Subcutaneous Q12H  . insulin aspart  0-5 Units Subcutaneous QHS  . insulin aspart  0-9 Units Subcutaneous TID WC  . metoCLOPramide  10 mg Oral Daily  . [START ON 06/24/2019] midodrine  10 mg Oral Q T,Th,Sa-HD  . pantoprazole  40 mg Oral Daily  . rosuvastatin  40 mg Oral Daily  . sodium chloride flush  3 mL Intravenous Q12H    Continuous Infusions: . sodium chloride    . ceFEPime (MAXIPIME) IV    . [START ON 06/26/2019] vancomycin    . [START ON 06/24/2019] vancomycin       LOS: 1 day  Alma Friendly, MD [facility]  If 7PM-7AM, please contact night-coverage [email or web address] 06/23/2019, 2:25 PM

## 2019-06-23 NOTE — Plan of Care (Signed)
  Problem: Clinical Measurements: Goal: Respiratory complications will improve Outcome: Progressing   

## 2019-06-23 NOTE — Progress Notes (Signed)
  Echocardiogram 2D Echocardiogram has been performed with Def[facility].  Jacqueline [name] 06/23/2019, 3:50 PM

## 2019-06-24 DIAGNOSIS — R7989 Other specified abnormal findings of blood chemistry: Secondary | ICD-10-CM

## 2019-06-24 DIAGNOSIS — E872 Acidosis: Secondary | ICD-10-CM

## 2019-06-24 DIAGNOSIS — A419 Sepsis, unspecified organism: Secondary | ICD-10-CM

## 2019-06-24 LAB — BASIC METABOLIC PANEL
Anion gap: 17 — ABNORMAL HIGH (ref 5–15)
BUN: 53 mg/dL — ABNORMAL HIGH (ref 8–23)
CO2: 24 mmol/L (ref 22–32)
Calcium: 9.3 mg/dL (ref 8.9–10.3)
Chloride: 98 mmol/L (ref 98–111)
Creatinine, Ser: 11.79 mg/dL — ABNORMAL HIGH (ref 0.44–1.00)
GFR calc Af Amer: 3 mL/min — ABNORMAL LOW (ref >60)
GFR calc non Af Amer: 3 mL/min — ABNORMAL LOW (ref >60)
Glucose, Bld: 114 mg/dL — ABNORMAL HIGH (ref 70–99)
Potassium: 3.8 mmol/L (ref 3.5–5.1)
Sodium: 139 mmol/L (ref 135–145)

## 2019-06-24 LAB — CBC WITH DIFFERENTIAL/PLATELET
Abs Immature Granulocytes: 0.04 10*3/uL (ref 0.00–0.07)
Basophils Absolute: 0.1 10*3/uL (ref 0.0–0.1)
Basophils Relative: 1 %
Eosinophils Absolute: 0.1 10*3/uL (ref 0.0–0.5)
Eosinophils Relative: 1 %
HCT: 40.9 % (ref 36.0–46.0)
Hemoglobin: 13 g/dL (ref 12.0–15.0)
Immature Granulocytes: 0 %
Lymphocytes Relative: 21 %
Lymphs Abs: 2 10*3/uL (ref 0.7–4.0)
MCH: 31.4 pg (ref 26.0–34.0)
MCHC: 31.8 g/dL (ref 30.0–36.0)
MCV: 98.8 fL (ref 80.0–100.0)
Monocytes Absolute: 0.9 10*3/uL (ref 0.1–1.0)
Monocytes Relative: 10 %
Neutro Abs: 6.1 10*3/uL (ref 1.7–7.7)
Neutrophils Relative %: 67 %
Platelets: 166 10*3/uL (ref 150–400)
RBC: 4.14 MIL/uL (ref 3.87–5.11)
RDW: 14.1 % (ref 11.5–15.5)
WBC: 9.3 10*3/uL (ref 4.0–10.5)
nRBC: 0 % (ref 0.0–0.2)

## 2019-06-24 LAB — PROCALCITONIN: Procalcitonin: 2.47 ng/mL

## 2019-06-24 LAB — GLUCOSE, CAPILLARY: Glucose-Capillary: 147 mg/dL — ABNORMAL HIGH (ref 70–99)

## 2019-06-24 LAB — LACTIC ACID, PLASMA: Lactic Acid, Venous: 0.9 mmol/L (ref 0.5–1.9)

## 2019-06-24 MED ORDER — MIDODRINE HCL 5 MG PO TABS
20.0000 mg | ORAL_TABLET | Freq: Once | ORAL | Status: AC | PRN
  Administered 2019-06-24: 20 mg via ORAL
  Filled 2019-06-24: qty 4

## 2019-06-24 NOTE — Progress Notes (Addendum)
[facility] Progress Note   Subjective:   Patient seen and examined at bedside.  Reports she thinks she has gained weight over the last few months while being quarantined because all she does is stay home and eat.  Ready to go home and go to OP dialysis.    Objective Vitals:   06/23/19 1630 06/23/19 2112 06/24/19 0509 06/24/19 0825  BP: 129/72 134/73 (!) 103/58 (!) 99/53  Pulse: 72 68 62 66  Resp: 18 18 18    Temp: 98 F (36.7 C) (!) 97.5 F (36.4 C) 98.8 F (37.1 C) 98.2 F (36.8 C)  TempSrc:  Oral Oral Oral  SpO2: 98% 97% 97% 96%  Weight:  115.8 kg    Height:       Physical Exam General:NAD, obese, pleasant female Heart:RRR, no mrg Lungs:CTAB Abdomen:soft, NTND Extremities:trace LE edema on L, none on R Dialysis Access: LU AVF +b   Filed Weights   06/22/19 1200 06/23/19 2112  Weight: 103.9 kg 115.8 kg    Intake/Output Summary (Last 24 hours) at 06/24/2019 1118 Last data filed at 06/24/2019 0547 Gross per 24 hour  Intake 740 ml  Output 0 ml  Net 740 ml    Additional Objective Labs: Basic Metabolic Panel: Recent Labs  Lab 06/22/19 1138 06/23/19 0558 06/24/19 0453  NA 139 141 139  K 3.1* 3.6 3.8  CL 95* 100 98  CO2 23 25 24   GLUCOSE 219* 130* 114*  BUN 20 31* 53*  CREATININE 7.44* 9.08* 11.79*  CALCIUM 9.3 9.5 9.3   Liver Function Tests: Recent Labs  Lab 06/22/19 1138  AST 47*  ALT 46*  ALKPHOS 86  BILITOT 0.8  PROT 8.0  ALBUMIN 3.5   CBC: Recent Labs  Lab 06/22/19 1138 06/23/19 0558 06/24/19 0453  WBC 15.2* 11.0* 9.3  NEUTROABS 12.2*  --  6.1  HGB 14.5 12.9 13.0  HCT 48.1* 41.4 40.9  MCV 103.9* 100.5* 98.8  PLT 204 174 166   CBG: Recent Labs  Lab 06/23/19 0653 06/23/19 1138 06/23/19 1622 06/23/19 2113 06/24/19 0653  GLUCAP 133* 159* 151* 125* 147*   Studies/Results: Dg Chest Portable 1 View  Result Date: 06/22/2019 CLINICAL DATA:  Hypotension. Weakness and fatigue during dialysis today. EXAM: PORTABLE CHEST  1 VIEW COMPARISON:  12/31/2016. FINDINGS: The cardiac silhouette remains borderline enlarged. Clear lungs with normal vascularity. Stable mildly prominent interstitial markings. Thoracic and cervical spine degenerative changes. IMPRESSION: No acute abnormality. Stable mild chronic interstitial lung disease. Electronically Signed   By: Claudie Revering M.D.   On: 06/22/2019 12:09    Medications: . sodium chloride    . ceFEPime (MAXIPIME) IV 1 g (06/23/19 1640)  . [START ON 06/26/2019] vancomycin    . vancomycin     . aspirin EC  81 mg Oral Daily  . Chlorhexidine Gluconate Cloth  6 each Topical Q0600  . cholecalciferol  2,000 Units Oral Daily  . doxercalciferol  5 mcg Intravenous Q T,Th,Sa-HD  . ezetimibe  10 mg Oral Daily  . ferric citrate  420 mg Oral BID WC  . heparin  5,000 Units Subcutaneous Q12H  . insulin aspart  0-5 Units Subcutaneous QHS  . insulin aspart  0-9 Units Subcutaneous TID WC  . metoCLOPramide  10 mg Oral Daily  . pantoprazole  40 mg Oral Daily  . rosuvastatin  40 mg Oral Daily  . sodium chloride flush  3 mL Intravenous Q12H    Dialysis Orders: TTS - [place]  3.5hrs, BFR 400,  DFR 800,  EDW 114.5kg, 2K/ 2.25Ca  Access: LU AVF  Heparin 6000 unit bolus Hectorol 42mcg IV qHDTTS - [place]  3.5hrs, BFR 400, DFR 800,  EDW 114.5kg, 2K/ 2.25Ca  Assessment/Plan: 1.  Elevated Troponin in known Hx CAD- ECHO normal. Cardio signed off.  2.  ESRD -  On HD TTS. Plan for HD today at OP center.    3.  Hypotension - chronic issue.  On midodrine with HD.   4.  Volume  - Does not appear volume overloaded on exam.  Will raise dry wt by 1kg due to low BP's on HD and lack of any vol excess on exam.  5.  Anemia of CKD - Hgb 13.0. No indication for ESA at this time.  6.  Secondary Hyperparathyroidism -  Ca in goal. Will check phos. Continue VDRA and binders - auryxia. 7.  Nutrition - Renal diet w/fluid restrictions 8.  Dispo - for dc today, plan is for her to go to her outpt unit for dialysis  today    Jen Mow, PA-C [facility] Pager: [phone] 06/24/2019,11:18 AM  LOS: 2 days   Pt seen, examined and agree w A/P as above.  Kelly Splinter  MD 06/24/2019, 12:23 PM

## 2019-06-24 NOTE — Progress Notes (Signed)
Patient scheduled at [facility] HD [facility] today at 11:30am. Per Dr. Schertz/Nephrology, patient is cleared for discharge and will contact attending. Per CSW/V. Crawford, patient's husband can pick up patient. Per CSW/K. Stultz at [facility], patient's husband provides transportation to and from HD on a regular basis.   Jacqueline [name], [place] Renal Navigator [phone]

## 2019-06-24 NOTE — Progress Notes (Signed)
[name] to be discharged Home per MD order. Discussed prescriptions and follow up appointments with the patient. Prescriptions explained to patient; medication list explained in detail. Patient verbalized understanding.  Patient is supposed to go for out pt HD today which is scheduled at 11:30 am, she is aware of it and verbalized understanding.  Skin clean, dry and intact without evidence of skin break down, no evidence of skin tears noted. IV catheter discontinued intact. Site without signs and symptoms of complications. Dressing and pressure applied. Pt denies pain at the site currently. No complaints noted.  Patient free of lines, drains, and wounds.   An After Visit Summary (AVS) was printed and given to the patient. Patient escorted via wheelchair, and discharged home via private auto.  Amaryllis Dyke, RN

## 2019-06-24 NOTE — Progress Notes (Signed)
    Elevated tropoinin - flat trend in HD patient with Hypotension during HD. - Non ACS trend, Echo normal.  NO further Cardiology evaluation needed in the inpatient setting given no SSx of angina & normal Echo.  [facility] will sign off.   Medication Recommendations:  Continue home meds Other recommendations (labs, testing, etc):  n/a Follow up as an outpatient:  Will arrange OP f/u with Dr. Radford Pax or APP - to consider possible OP evaluation.    Glenetta Hew, MD

## 2019-06-24 NOTE — Discharge Summary (Signed)
Discharge Summary  Jacqueline [name] RXV:[id number] DOB: 05-09-1946  PCP: [name], FNP  Admit date: 06/22/2019 Discharge date: 06/24/2019  Time spent: 40 mins  Recommendations for Outpatient Follow-up:  1. PCP in 1 week with repeat labs 2. Cardiology 3. Nephrology  Discharge Diagnoses:  Active Hospital Problems   Diagnosis Date Noted  . Sepsis ([place]) 06/22/2019    Resolved Hospital Problems  No resolved problems to display.    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 Patient feels fatigued, no chest pains, shortness of breath, nausea, vomiting or diarrhea. Patient received 500 cc normal saline bolus in the emergency room and the blood pressure started to pick up. No report of fever or chills. In the emergency room the patient was found to have leukocytosis with elevated lactic acid at 5.6. Her EKG was normal and so was her temperature. Patient started on IV antibiotics and cultures were drawn in the emergency room.   Today, patient continues to deny any new complaints, denies any chest pain, shortness of breath, abdominal pain, nausea/vomiting, fever/chills, diarrhea, cough.  Patient stable for discharge.  Patient to go for hemodialysis as an outpatient today scheduled at 11:30 AM  Hospital Course:  Active Problems:   Sepsis ([place])  Unlikely sepsis, ruled out Hypotension, leukocytosis, lactic acidosis on presentation Currently afebrile, with no leukocytosis Hypotension likely  due to HD, leukocytosis likely reactive, lactic acidosis likely due to hypotension Lactic acidosis 5.6--> 2.5--> 0.9 Procalcitonin 2.04--> 2.47 (spoke to ID, Dr. Talbot Grumbling about rising procalcitonin, recommended not to check procalcitonin in ESRD patients due to lack of study in this patient's group).  Recommended to stop IV antibiotics, unlikely patient is septic BC x2 no growth till date Chest x-ray unremarkable UA not done as patient is anuric Continue IV Vanco + IV cefepime as blood culture is negative x48 hours Follow-up with PCP  Hypotension Resolved Chronic issue likely 2/2 HD Continue Midodrine  Elevated troponin/CAD status post DES in 2018 Denies any chest pain, shortness of breath Likely due to demand ischemia from hypotension Flat trend, EKG with no acute ST changes Echo with EF 60 to 65%, no regional wall motion abnormalities noted Cardiology on board, no further recommendation, signed off.  Follow-up as an outpatient Continue aspirin, statin  Diabetes mellitus type 2 Continue SSI, Accu-Cheks, hypoglycemic protocol  End-stage renal disease on HD TTS schedule Nephrology consulted Follow-up with outpatient dialysis schedule  Hyperlipidemia Continue statins  Obesity Lifestyle modification advised             Malnutrition Type:      Malnutrition Characteristics:      Nutrition Interventions:      Estimated body mass index is 43.82 kg/m as calculated from the following:   Height as of this encounter: 5\' 4"  (1.626 m).   Weight as of this encounter: 115.8 kg.    Procedures:  None  Consultations:  Cardiology  Nephrology  Discharge Exam: BP (!) 99/53 (BP Location: Right Arm)   Pulse 66  Temp 98.2 F (36.8 C) (Oral)   Resp 18   Ht 5\' 4"  (1.626 m)   Wt 115.8 kg   SpO2 96%   BMI 43.82 kg/m   General: NAD Cardiovascular: S1, S2 present Respiratory: Diminished breath sounds bilaterally  Discharge Instructions You were  cared for by a hospitalist during your hospital stay. If you have any questions about your discharge medications or the care you received while you were in the hospital after you are discharged, you can call the unit and asked to speak with the hospitalist on call if the hospitalist that took care of you is not available. Once you are discharged, your primary care physician will handle any further medical issues. Please note that NO REFILLS for any discharge medications will be authorized once you are discharged, as it is imperative that you return to your primary care physician (or establish a relationship with a primary care physician if you do not have one) for your aftercare needs so that they can reassess your need for medications and monitor your lab values.   Allergies as of 06/24/2019      Reactions   Sulfa Drugs Cross Reactors Other (See Comments)   Doesn't remember       Medication List    TAKE these medications   aspirin 81 MG EC tablet Take 1 tablet (81 mg total) by mouth daily.   Auryxia 1 GM 210 MG(Fe) tablet Generic drug: ferric citrate Take 420 mg by mouth 2 (two) times a day.   benzoyl peroxide 5 % external liquid Commonly known as: benzoyl peroxide Apply topically 2 (two) times daily.   Dialyvite 800 0.8 MG Tabs Take 1 tablet by mouth daily.   diclofenac sodium 1 % Gel Commonly known as: VOLTAREN Apply 2 g topically 4 (four) times daily. What changed:   when to take this  reasons to take this   ezetimibe 10 MG tablet Commonly known as: ZETIA TAKE 1 TABLET BY MOUTH EVERY DAY   Fish Oil 1000 MG Cpdr Take 1,000 mg by mouth daily.   metoCLOPramide 10 MG tablet Commonly known as: REGLAN TAKE 1 TABLET BY ORAL ROUTE 2 TIMES EVERY DAY 30 MINUTES BEFORE MEALS AND AT BEDTIME What changed: See the new instructions.   midodrine 10 MG tablet Commonly known as: PROAMATINE Take 10 mg by mouth 3 (three) times a week. Dialysis days [place] Saturday    nitroGLYCERIN 0.4 MG SL tablet Commonly known as: NITROSTAT Place 1 tablet (0.4 mg total) under the tongue every 5 (five) minutes as needed for chest pain.   omeprazole 20 MG capsule Commonly known as: PRILOSEC TAKE ONE CAPSULE EVERY DAY BEFORE MEALS What changed: See the new instructions.   rosuvastatin 40 MG tablet Commonly known as: CRESTOR TAKE 1 TABLET BY MOUTH DAILY   Vitamin D (Ergocalciferol) 50 MCG (2000 UT) Caps Take 2,000 Units by mouth daily.      Allergies  Allergen Reactions  . Sulfa Drugs Cross Reactors Other (See Comments)    Doesn't remember    Follow-up Information    [name], FNP. Schedule an appointment as soon as possible for a visit in 1 week(s).   Specialty: General Practice Contact information: [address] [place] [address] [phone]        Sueanne Margarita, MD .   Specialty: Cardiology Contact information: [phone] [place] [address] [phone]            The  results of significant diagnostics from this hospitalization (including imaging, microbiology, ancillary and laboratory) are listed below for reference.    Significant Diagnostic Studies: Dg Chest Portable 1 View  Result Date: 06/22/2019 CLINICAL DATA:  Hypotension. Weakness and fatigue during dialysis today. EXAM: PORTABLE CHEST 1 VIEW COMPARISON:  12/31/2016. FINDINGS: The cardiac silhouette remains borderline enlarged. Clear lungs with normal vascularity. Stable mildly prominent interstitial markings. Thoracic and cervical spine degenerative changes. IMPRESSION: No acute abnormality. Stable mild chronic interstitial lung disease. Electronically Signed   By: Claudie Revering M.D.   On: 06/22/2019 12:09    Microbiology: Recent Results (from the past 240 hour(s))  Blood Culture (routine x 2)     Status: None (Preliminary result)   Collection Time: 06/22/19  1:30 PM   Specimen: BLOOD RIGHT ARM  Result Value Ref Range Status    Specimen Description BLOOD RIGHT ARM  Final   Special Requests   Final    BOTTLES DRAWN AEROBIC AND ANAEROBIC Blood Culture adequate volume   Culture   Final    NO GROWTH 2 DAYS Performed at [facility], [address], [place], [place] [address]    Report Status PENDING  Incomplete  SARS Coronavirus 2 (CEPHEID - Performed in [facility] hospital lab), Hosp Order     Status: None   Collection Time: 06/22/19  1:50 PM   Specimen: Nasopharyngeal Swab  Result Value Ref Range Status   SARS Coronavirus 2 NEGATIVE NEGATIVE Final    Comment: (NOTE) If result is NEGATIVE SARS-CoV-2 target nucleic acids are NOT DETECTED. The SARS-CoV-2 RNA is generally detectable in upper and lower  respiratory specimens during the acute phase of infection. The lowest  concentration of SARS-CoV-2 viral copies this assay can detect is 250  copies / mL. A negative result does not preclude SARS-CoV-2 infection  and should not be used as the sole basis for treatment or other  patient management decisions.  A negative result may occur with  improper specimen collection / handling, submission of specimen other  than nasopharyngeal swab, presence of viral mutation(s) within the  areas targeted by this assay, and inadequate number of viral copies  (<250 copies / mL). A negative result must be combined with clinical  observations, patient history, and epidemiological information. If result is POSITIVE SARS-CoV-2 target nucleic acids are DETECTED. The SARS-CoV-2 RNA is generally detectable in upper and lower  respiratory specimens dur ing the acute phase of infection.  Positive  results are indicative of active infection with SARS-CoV-2.  Clinical  correlation with patient history and other diagnostic information is  necessary to determine patient infection status.  Positive results do  not rule out bacterial infection or co-infection with other viruses. If result is PRESUMPTIVE POSTIVE SARS-CoV-2 nucleic  acids MAY BE PRESENT.   A presumptive positive result was obtained on the submitted specimen  and confirmed on repeat testing.  While 2019 novel coronavirus  (SARS-CoV-2) nucleic acids may be present in the submitted sample  additional confirmatory testing may be necessary for epidemiological  and / or clinical management purposes  to differentiate between  SARS-CoV-2 and other Sarbecovirus currently known to infect humans.  If clinically indicated additional testing with an alternate test  methodology [phone]) is advised. The SARS-CoV-2 RNA is generally  detectable in upper and lower respiratory sp ecimens during the acute  phase of infection. The expected result is Negative. Fact Sheet for Patients:  StrictlyIdeas.no Fact Sheet for Healthcare Providers: BankingDealers.co.za This test is not yet  approved or cleared by the [place] and has been authorized for detection and/or diagnosis of SARS-CoV-2 by FDA under an Emergency Use Authorization (EUA).  This EUA will remain in effect (meaning this test can be used) for the duration of the COVID-19 declaration under Section 564(b)(1) of the Act, 21 U.S.C. section 360bbb-3(b)(1), unless the authorization is terminated or revoked sooner. Performed at [facility], [place] [address] [place], [place] [address]   Blood Culture (routine x 2)     Status: None (Preliminary result)   Collection Time: 06/22/19  3:50 PM   Specimen: BLOOD RIGHT HAND  Result Value Ref Range Status   Specimen Description BLOOD RIGHT HAND  Final   Special Requests   Final    BOTTLES DRAWN AEROBIC ONLY Blood Culture adequate volume   Culture   Final    NO GROWTH 2 DAYS Performed at [facility], [place] [address], [place], [place] [address]    Report Status PENDING  Incomplete     Labs: Basic Metabolic Panel: Recent Labs  Lab 06/22/19 1138 06/23/19 0558 06/24/19 0453  NA 139 141 139  K 3.1*  3.6 3.8  CL 95* 100 98  CO2 23 25 24   GLUCOSE 219* 130* 114*  BUN 20 31* 53*  CREATININE 7.44* 9.08* 11.79*  CALCIUM 9.3 9.5 9.3   Liver Function Tests: Recent Labs  Lab 06/22/19 1138  AST 47*  ALT 46*  ALKPHOS 86  BILITOT 0.8  PROT 8.0  ALBUMIN 3.5   No results for input(s): LIPASE, AMYLASE in the last 168 hours. No results for input(s): AMMONIA in the last 168 hours. CBC: Recent Labs  Lab 06/22/19 1138 06/23/19 0558 06/24/19 0453  WBC 15.2* 11.0* 9.3  NEUTROABS 12.2*  --  6.1  HGB 14.5 12.9 13.0  HCT 48.1* 41.4 40.9  MCV 103.9* 100.5* 98.8  PLT 204 174 166   Cardiac Enzymes: No results for input(s): CKTOTAL, CKMB, CKMBINDEX, TROPONINI in the last 168 hours. BNP: BNP (last 3 results) No results for input(s): BNP in the last 8760 hours.  ProBNP (last 3 results) No results for input(s): PROBNP in the last 8760 hours.  CBG: Recent Labs  Lab 06/23/19 0653 06/23/19 1138 06/23/19 1622 06/23/19 2113 06/24/19 0653  GLUCAP 133* 159* 151* 125* 147*       Signed:  Alma Friendly, MD [facility] 06/24/2019, 10:09 AM

## 2019-06-24 NOTE — Evaluation (Signed)
Physical Therapy Evaluation Patient Details Name: Jacqueline [name] MRN: [id number] DOB: [birth year] Today's Date: 06/24/2019   History of Present Illness  Jacqueline [name]  is a 73 y.o. 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  Admitted due to leukocytosis and elevated lactic acid.  Clinical Impression  Patient presents close to her functional baseline per her report.  Assisted today with HHA per her request; but likely more independent with RW.  Has one at home and uses it as needed.  Feels close to functional baseline and BP stable with upright activity.  No follow up PT recommended.  Patient for d/c home today.  Will sign off.   Orthostatic VS for the past 24 hrs (Last 3 readings):  BP- Lying BP- Sitting Pulse- Sitting BP- Standing at 0 minutes Pulse- Standing at 0 minutes  06/24/19 0900 129/52 141/65 62 140/68 68      Follow Up Recommendations No PT follow up    Equipment Recommendations  None recommended by PT    Recommendations for Other Services       Precautions / Restrictions Precautions Precautions: Fall Precaution Comments: watch BP Restrictions Weight Bearing Restrictions: No      Mobility  Bed Mobility Overal bed mobility: Needs Assistance Bed Mobility: Supine to Sit     Supine to sit: Min assist     General bed mobility comments: pulls up with one hand assist, HOB elevated  Transfers Overall transfer level: Needs assistance Equipment used: 1 person hand held assist Transfers: Sit to/from Stand Sit to Stand: Min assist         General transfer comment: pulls up with one hand assist  Ambulation/Gait Ambulation/Gait assistance: Min assist Gait Distance (Feet): 120 Feet Assistive device: 1 person hand held assist Gait  Pattern/deviations: Step-through pattern;Wide base of support;Decreased stride length     General Gait Details: using wall railing intermittently, refused walker for hallway ambulation, but asked for one hand assist  Stairs            Wheelchair Mobility    Modified Rankin (Stroke Patients Only)       Balance Overall balance assessment: Needs assistance   Sitting balance-Leahy Scale: Good       Standing balance-Leahy Scale: Fair Standing balance comment: can standing with her shoes on unaided, but needs UE support to walk                             Pertinent Vitals/Pain Pain Assessment: No/denies pain    Home Living Family/patient expects to be discharged to:: Private residence Living Arrangements: Spouse/significant other Available Help at Discharge: Family Type of Home: House Home Access: Level entry     Home Layout: One level Home Equipment: Shower seat;Grab bars - tub/shower;Walker - 4 wheels Additional Comments: spouse takes her to HD    Prior Function Level of Independence: Independent with assistive device(s)         Comments: uses rollator to dialysis no device in the home, takes her own shower, spouse does IADL's     Hand Dominance        Extremity/Trunk Assessment   Upper Extremity Assessment Upper Extremity Assessment: Generalized weakness    Lower Extremity Assessment Lower Extremity Assessment: Generalized weakness(peripheral neuropathy)  Communication   Communication: No difficulties  Cognition Arousal/Alertness: Awake/alert Behavior During Therapy: WFL for tasks assessed/performed Overall Cognitive Status: Within Functional Limits for tasks assessed                                        General Comments      Exercises     Assessment/Plan    PT Assessment Patent does not need any further PT services  PT Problem List         PT Treatment Interventions      PT Goals (Current  goals can be found in the Care Plan section)  Acute Rehab PT Goals PT Goal Formulation: All assessment and education complete, DC therapy    Frequency     Barriers to discharge        Co-evaluation               AM-PAC PT "6 Clicks" Mobility  Outcome Measure Help needed turning from your back to your side while in a flat bed without using bedrails?: None Help needed moving from lying on your back to sitting on the side of a flat bed without using bedrails?: A Little Help needed moving to and from a bed to a chair (including a wheelchair)?: A Little Help needed standing up from a chair using your arms (e.g., wheelchair or bedside chair)?: A Little Help needed to walk in hospital room?: A Little Help needed climbing 3-5 steps with a railing? : A Lot 6 Click Score: 18    End of Session Equipment Utilized During Treatment: Gait belt Activity Tolerance: Patient tolerated treatment well Patient left: in bed;with call bell/phone within reach   PT Visit Diagnosis: Other abnormalities of gait and mobility (R26.89)    Time: 6314-9702 PT Time Calculation (min) (ACUTE ONLY): 27 min   Charges:   PT Evaluation $PT Eval Low Complexity: 1 Low PT Treatments $Gait Training: 8-22 mins        Magda Kiel, [place] [phone] 06/24/2019   Reginia Naas 06/24/2019, 11:09 AM

## 2019-06-27 LAB — CULTURE, BLOOD (ROUTINE X 2)
Culture: NO GROWTH
Culture: NO GROWTH
Special Requests: ADEQUATE
Special Requests: ADEQUATE

## 2019-06-28 ENCOUNTER — Telehealth: Payer: Self-pay

## 2019-06-28 NOTE — Telephone Encounter (Signed)
Transition Care Management Follow-up Telephone Call  Date of discharge and from where: [place] 06/24/19  How have you been since you were released from the hospital? she's been fine  Any questions or concerns? none  Items Reviewed:  Did the pt receive and understand the discharge instructions provided? Yes   Medications obtained and verified? yes  Any new allergies since your discharge? no  Dietary orders reviewed? no  Do you have support at home? yes  Other (ie: DME, Home Health, etc) no  Functional Questionnaire: (I = Independent and D = Dependent) ADL's: I  Bathing/Dressing- I   Meal Prep- I  Eating- I  Maintaining continence- I  Transferring/Ambulation- I  Managing Meds- I   Follow up appointments reviewed:    PCP Hospital f/u appt confirmed? YESScheduled to see [name] FNP-BC on 07/05/19 @ 3:45PM.  [facility] f/u appt confirmed? YES 07/16/19 with cardiologist  Are transportation arrangements needed? no  If their condition worsens, is the pt aware to call  their PCP or go to the ED? yes  Was the patient provided with contact information for the PCP's office or ED? yes  Was the pt encouraged to call back with questions or concerns? yes

## 2019-07-01 ENCOUNTER — Other Ambulatory Visit: Payer: Self-pay | Admitting: Nurse Practitioner

## 2019-07-03 ENCOUNTER — Other Ambulatory Visit: Payer: Self-pay | Admitting: Cardiology

## 2019-07-05 ENCOUNTER — Encounter: Payer: Self-pay | Admitting: Nurse Practitioner

## 2019-07-05 ENCOUNTER — Ambulatory Visit (INDEPENDENT_AMBULATORY_CARE_PROVIDER_SITE_OTHER): Payer: Medicare Other | Admitting: Nurse Practitioner

## 2019-07-05 ENCOUNTER — Other Ambulatory Visit: Payer: Self-pay

## 2019-07-05 VITALS — Wt 249.0 lb

## 2019-07-05 DIAGNOSIS — Z09 Encounter for follow-up examination after completed treatment for conditions other than malignant neoplasm: Secondary | ICD-10-CM

## 2019-07-05 DIAGNOSIS — N186 End stage renal disease: Secondary | ICD-10-CM | POA: Diagnosis not present

## 2019-07-05 DIAGNOSIS — E782 Mixed hyperlipidemia: Secondary | ICD-10-CM

## 2019-07-05 DIAGNOSIS — E1129 Type 2 diabetes mellitus with other diabetic kidney complication: Secondary | ICD-10-CM

## 2019-07-05 DIAGNOSIS — E872 Acidosis, unspecified: Secondary | ICD-10-CM

## 2019-07-05 DIAGNOSIS — Z992 Dependence on renal dialysis: Secondary | ICD-10-CM

## 2019-07-05 DIAGNOSIS — A419 Sepsis, unspecified organism: Secondary | ICD-10-CM

## 2019-07-05 NOTE — Progress Notes (Signed)
Virtual Visit via Telephone   This visit type was conducted due to national recommendations for restrictions regarding the COVID-19 Pandemic (e.g. social distancing) in an effort to limit this patient's exposure and mitigate transmission in our community.  Due to her co-morbid illnesses, this patient is at least at moderate risk for complications without adequate follow up.  This format is felt to be most appropriate for this patient at this time.  All issues noted in this document were discussed and addressed.  A limited physical exam was performed with this format.    This visit type was conducted due to national recommendations for restrictions regarding the COVID-19 Pandemic (e.g. social distancing) in an effort to limit this patient's exposure and mitigate transmission in our community.  Patients identity confirmed using two different identifiers.  This format is felt to be most appropriate for this patient at this time.  All issues noted in this document were discussed and addressed.  No physical exam was performed (except for noted visual exam findings with Video Visits).    Date:  08/01/2019   ID:  Jacqueline [name], DOB Mar 25, 1946, MRN [id number]  Patient Location:  Home - spoke with Aretha Parrot  Provider location:   Office    Chief Complaint:  Hospital follow up  History of Present Illness:    MELIZA KAGE is a 73 y.o. female who presents via video conferencing for a telehealth visit today.    The patient does not have symptoms concerning for COVID-19 infection (fever, chills, cough, or new shortness of breath).   Hospital follow up for what was thought to be sepsis however her labs were negative for this while at dialysis. She was admitted from 7/22-7/24  She had been feeling fatigued, no chest pain, shortness of breath, nausea or diarrhea. She had leukocytosis and an elevated lactic acid.  She was given 500 cc IV fluids and had been started on IV antibiotics which were  stopped. She was also found to be hypotensive.      At this time she does not need anything.  She has a follow up with Cardiology on 8/14.         Past Medical History:  Diagnosis Date  . Arthritis    "left knee" (12/04/2016)  . CAD (coronary artery disease)    a. 12/2016 NSTEMI/PCI: LM nl, LaD 20p/d, D1 20, OM2 20, OM3 20, RCA 41m (3.0x18 Resolute DES). EF 65%.  . Chronic lower back pain   . Diverticulosis    on CT  . ESRD on dialysis [facility])    "[facility]; [place]; TTS" (12/04/2016)  . GERD (gastroesophageal reflux disease)   . Hiatal hernia   . Hyperlipidemia   . Hypertension    a. 12/2016 labetalol d/c'd 2/2 hypotension in HD.  . Morbid obesity ([place])   . Obesity   . Pneumonia    "couple times" (12/04/2016)  . Renal insufficiency   . Type II diabetes mellitus ([place])    Past Surgical History:  Procedure Laterality Date  . ABDOMINAL HYSTERECTOMY    . AV FISTULA PLACEMENT Left 01/27/2014   Procedure: ARTERIOVENOUS (AV) FISTULA CREATION;  Surgeon: Mal Misty, MD;  Location: [place];  Service: Vascular;  Laterality: Left;  . AV FISTULA REPAIR     "had it cleaned out"  . CARDIAC CATHETERIZATION  ?1980s  . CARDIAC CATHETERIZATION N/A 12/04/2016   Procedure: Left Heart Cath Angiography;  Surgeon: Burnell Blanks, MD;  Location: [place] CV LAB;  Service: Cardiovascular;  Laterality: N/A;  . CARDIAC CATHETERIZATION N/A 12/04/2016   Procedure: Coronary Stent Intervention;  Surgeon: Burnell Blanks, MD;  Location: [place] CV LAB;  Service: Cardiovascular;  Laterality: N/A;  Mid RCA  . CATARACT EXTRACTION W/ INTRAOCULAR LENS IMPLANT Left   . COLONOSCOPY W/ BIOPSIES AND POLYPECTOMY    . CORONARY ANGIOPLASTY WITH STENT PLACEMENT  12/04/2016  . ESOPHAGOGASTRODUODENOSCOPY    . INSERTION OF DIALYSIS CATHETER N/A 01/19/2014   Procedure: INSERTION OF DIALYSIS CATHETER;  Surgeon: Rosetta Posner, MD;  Location: [facility];  Service: Vascular;  Laterality: N/A;      Current Meds  Medication Sig  . aspirin EC 81 MG EC tablet Take 1 tablet (81 mg total) by mouth daily.  Lorin Picket 1 GM 210 MG(Fe) tablet Take 420 mg by mouth 2 (two) times a day.  . B Complex-C-Folic Acid (DIALYVITE 017) 0.8 MG TABS Take 1 tablet by mouth daily.  . benzoyl peroxide (BENZOYL PEROXIDE) 5 % external liquid Apply topically 2 (two) times daily.  . diclofenac sodium (VOLTAREN) 1 % GEL Apply 2 g topically 4 (four) times daily.  Marland Kitchen ezetimibe (ZETIA) 10 MG tablet TAKE 1 TABLET BY MOUTH EVERY DAY  . metoCLOPramide (REGLAN) 10 MG tablet TAKE 1 TABLET BY ORAL ROUTE 2 TIMES EVERY DAY 30 MINUTES BEFORE MEALS AND AT BEDTIME  . midodrine (PROAMATINE) 10 MG tablet Take 10 mg by mouth 3 (three) times a week. Dialysis days [facility] Saturday  . Omega-3 Fatty Acids (FISH OIL) 1000 MG CPDR Take 1,000 mg by mouth daily.   . Vitamin D, Ergocalciferol, 50 MCG (2000 UT) CAPS Take 2,000 Units by mouth daily.  . [DISCONTINUED] nitroGLYCERIN (NITROSTAT) 0.4 MG SL tablet Place 1 tablet (0.4 mg total) under the tongue every 5 (five) minutes as needed for chest pain.  . [DISCONTINUED] omeprazole (PRILOSEC) 20 MG capsule TAKE ONE CAPSULE EVERY DAY BEFORE MEALS  . [DISCONTINUED] rosuvastatin (CRESTOR) 40 MG tablet TAKE 1 TABLET BY MOUTH EVERY DAY     Allergies:   Sulfa drugs cross reactors   Social History   Tobacco Use  . Smoking status: Former Smoker    Packs/day: 1.00    Years: 20.00    Pack years: 20.00    Types: Cigarettes    Quit date: 1985    Years since quitting: 35.6  . Smokeless tobacco: Never Used  Substance Use Topics  . Alcohol use: No    Alcohol/week: 0.0 standard drinks  . Drug use: No     Family Hx: The patient's family history includes Breast cancer in her sister; CAD in her father and sister; Cancer in her brother; Diabetes in her mother; Heart attack in her father; Heart disease in her father; Hypertension in her father; Peripheral vascular disease in her mother.  ROS:    Please see the history of present illness.    Review of Systems  Constitutional: Negative.  Negative for fever.  Cardiovascular: Negative.   Genitourinary:       She goes to HD 3 days a week  Musculoskeletal: Negative.   Neurological: Negative.   Psychiatric/Behavioral: Negative.     All other systems reviewed and are negative.   Labs/Other Tests and Data Reviewed:    Recent Labs: 07/07/2019: ALT 43; BUN 28; Creatinine, Ser 8.70; Hemoglobin 13.5; Platelets 174; Potassium 3.6; Sodium 143   Recent Lipid Panel Lab Results  Component Value Date/Time   CHOL 134 07/07/2019 09:28 AM   TRIG 110 07/07/2019 09:28  AM   HDL 44 07/07/2019 09:28 AM   CHOLHDL 3.0 07/07/2019 09:28 AM   CHOLHDL 3.2 12/04/2016 05:28 AM   LDLCALC 68 07/07/2019 09:28 AM    Wt Readings from Last 3 Encounters:  07/16/19 253 lb (114.8 kg)  07/05/19 249 lb (112.9 kg)  06/23/19 255 lb 4.8 oz (115.8 kg)     Exam:    Vital Signs:  Wt 249 lb (112.9 kg)   BMI 42.74 kg/m     Physical Exam  Constitutional: She is oriented to person, place, and time. No distress.  Neurological: She is alert and oriented to person, place, and time.  Psychiatric: Mood, memory, affect and judgment normal.  Unable to visualize due to telephone visit  ASSESSMENT & PLAN:    1. Lactic acidosis  Was elevated when arrived at ER, given 500 cc of fluids before stopping, has improved prior to discharge  This was likely resultant of hypotension from her HD  2. ESRD (end stage renal disease) on dialysis ([place])  Continues with HD 3 days a week  3. Sepsis, due to unspecified organism, unspecified whether acute organ dysfunction present [facility])  She had leukocytosis upon arrival to ER, once IV fluids given thought she did not have actual sepsis - CBC with Diff - CMP14 + Anion Gap  4. Mixed hyperlipidemia  Chronic, stable  Continue with current medications - Lipid panel  5. Type 2 diabetes mellitus with microalbuminuria, without  long-term current use of insulin (HCC)  Chronic,  She is to come later this week for labs - Hemoglobin A1c    COVID-19 Education: The signs and symptoms of COVID-19 were discussed with the patient and how to seek care for testing (follow up with PCP or arrange E-visit).  The importance of social distancing was discussed today.  Patient Risk:   After full review of this patients clinical status, I feel that they are at least moderate risk at this time.  Time:   Today, I have spent 13 minutes/ seconds with the patient with telehealth technology discussing above diagnoses.     Medication Adjustments/Labs and Tests Ordered: Current medicines are reviewed at length with the patient today.  Concerns regarding medicines are outlined above.   Tests Ordered: Orders Placed This Encounter  Procedures  . Hemoglobin A1c  . CBC with Diff  . CMP14 + Anion Gap  . Lipid panel    Medication Changes: No orders of the defined types were placed in this encounter.   Disposition:  Follow up in 3 month(s)  Signed, [name], FNP

## 2019-07-07 ENCOUNTER — Other Ambulatory Visit: Payer: Medicare Other

## 2019-07-07 LAB — CBC WITH DIFFERENTIAL/PLATELET
Basophils Absolute: 0.1 10*3/uL (ref 0.0–0.2)
Basos: 1 %
EOS (ABSOLUTE): 0.2 10*3/uL (ref 0.0–0.4)
Eos: 2 %
Hematocrit: 42.9 % (ref 34.0–46.6)
Hemoglobin: 13.5 g/dL (ref 11.1–15.9)
Immature Grans (Abs): 0 10*3/uL (ref 0.0–0.1)
Immature Granulocytes: 0 %
Lymphocytes Absolute: 1.7 10*3/uL (ref 0.7–3.1)
Lymphs: 23 %
MCH: 30.8 pg (ref 26.6–33.0)
MCHC: 31.5 g/dL (ref 31.5–35.7)
MCV: 98 fL — ABNORMAL HIGH (ref 79–97)
Monocytes Absolute: 0.7 10*3/uL (ref 0.1–0.9)
Monocytes: 9 %
Neutrophils Absolute: 5 10*3/uL (ref 1.4–7.0)
Neutrophils: 65 %
Platelets: 174 10*3/uL (ref 150–450)
RBC: 4.38 x10E6/uL (ref 3.77–5.28)
RDW: 13.3 % (ref 11.7–15.4)
WBC: 7.7 10*3/uL (ref 3.4–10.8)

## 2019-07-07 LAB — CMP14 + ANION GAP
ALT: 43 IU/L — ABNORMAL HIGH (ref 0–32)
AST: 46 IU/L — ABNORMAL HIGH (ref 0–40)
Albumin/Globulin Ratio: 1.6 (ref 1.2–2.2)
Albumin: 4.1 g/dL (ref 3.7–4.7)
Alkaline Phosphatase: 91 IU/L (ref 39–117)
Anion Gap: 22 mmol/L — ABNORMAL HIGH (ref 10.0–18.0)
BUN/Creatinine Ratio: 3 — ABNORMAL LOW (ref 12–28)
BUN: 28 mg/dL — ABNORMAL HIGH (ref 8–27)
Bilirubin Total: 0.4 mg/dL (ref 0.0–1.2)
CO2: 28 mmol/L (ref 20–29)
Calcium: 9.4 mg/dL (ref 8.7–10.3)
Chloride: 93 mmol/L — ABNORMAL LOW (ref 96–106)
Creatinine, Ser: 8.7 mg/dL — ABNORMAL HIGH (ref 0.57–1.00)
GFR calc Af Amer: 5 mL/min/{1.73_m2} — ABNORMAL LOW (ref >59)
GFR calc non Af Amer: 4 mL/min/{1.73_m2} — ABNORMAL LOW (ref >59)
Globulin, Total: 2.5 g/dL (ref 1.5–4.5)
Glucose: 133 mg/dL — ABNORMAL HIGH (ref 65–99)
Potassium: 3.6 mmol/L (ref 3.5–5.2)
Sodium: 143 mmol/L (ref 134–144)
Total Protein: 6.6 g/dL (ref 6.0–8.5)

## 2019-07-07 LAB — LIPID PANEL
Chol/HDL Ratio: 3 ratio (ref 0.0–4.4)
Cholesterol, Total: 134 mg/dL (ref 100–199)
HDL: 44 mg/dL (ref >39)
LDL Calculated: 68 mg/dL (ref 0–99)
Triglycerides: 110 mg/dL (ref 0–149)
VLDL Cholesterol Cal: 22 mg/dL (ref 5–40)

## 2019-07-07 LAB — HEMOGLOBIN A1C
Est. average glucose Bld gHb Est-mCnc: 151 mg/dL
Hgb A1c MFr Bld: 6.9 % — ABNORMAL HIGH (ref 4.8–5.6)

## 2019-07-16 ENCOUNTER — Ambulatory Visit (INDEPENDENT_AMBULATORY_CARE_PROVIDER_SITE_OTHER): Payer: Medicare Other | Admitting: Cardiology

## 2019-07-16 ENCOUNTER — Other Ambulatory Visit: Payer: Self-pay

## 2019-07-16 ENCOUNTER — Encounter: Payer: Self-pay | Admitting: Cardiology

## 2019-07-16 ENCOUNTER — Encounter (INDEPENDENT_AMBULATORY_CARE_PROVIDER_SITE_OTHER): Payer: Self-pay

## 2019-07-16 VITALS — BP 120/64 | HR 80 | Ht 64.0 in | Wt 253.0 lb

## 2019-07-16 DIAGNOSIS — I2510 Atherosclerotic heart disease of native coronary artery without angina pectoris: Secondary | ICD-10-CM | POA: Diagnosis not present

## 2019-07-16 NOTE — Progress Notes (Signed)
07/16/2019 Jacqueline [name]   28-Dec-1945  [id number]  Primary Physician [name], FNP Primary Cardiologist: Fransico Him, MD  Electrophysiologist: None   Reason for Visit/CC: [facility] F/u   HPI: Jacqueline [name] is a 73 y.o. female with a hx of CAD s/p NSTEMI PCI->RCA '18, ESRD on HD, HL, HTN and DM who presents to clinic today for [name] hospital f/u. As noted, she had a NSTEMI in 2018 and underwent cath with PCI/DES to the RCA, residual disease LAD 20%, OM2/3 20%, D1 20%. EF was normal. She has had difficulty tolerating cardiac/ hypertensive medications due to hypotension w/ HD. She requires midodrine on HD days, TTS.  She was recently admitted 7/21-7/23 for hypotension during HD w/ SBP dropping into the 70s. She was transported to the ED and given IVFs and admitted by IM. No chest pain, but troponins were cycled and found to be abnormal. HsTrop 43>>58>>96>>96. EKG showed SR with nonspecific TWI, no acute ischemia. Cardiology consulted for elevated troponin, which was felt to be demand ischemia in the setting of hypotension and CKD. 2D echo was normal. In the setting of absent ischemic symptoms, low level- flat trend troponin and normal echo, no other inpatient cardiac w/u was indicated. Pt advised to f/u in clinic.   Today in clinic, she reports that she has done well. HD is going better. No more hypotension. No cardiac symptoms, denying CP, dyspnea, palpitations, syncope/ near syncope or LEE. She reports full med compliance. BP today is 120/64. HR 80.   Labs reviewed. Recent FLP 07/07/19 showed controlled LDL at 68 mg/dL. TG 110.    Cardiac Studies  2D Echo 06/2019 IMPRESSIONS    1. The left ventricle has normal systolic function with an ejection fraction of 60-65%. The cavity size was normal. There is mild asymmetric left ventricular hypertrophy. Left ventricular diastolic Doppler parameters are consistent with impaired  relaxation.  2. The right ventricle has normal systolic  function. The cavity was normal. There is no increase in right ventricular wall thickness.  3. There is moderate mitral annular calcification present.  4. The aortic valve is tricuspid. Mild thickening of the aortic valve. Mild calcification of the aortic valve.  5. The aorta is normal in size and structure.  Current Meds  Medication Sig  . aspirin EC 81 MG EC tablet Take 1 tablet (81 mg total) by mouth daily.  Lorin Picket 1 GM 210 MG(Fe) tablet Take 420 mg by mouth 2 (two) times a day.  . B Complex-C-Folic Acid (DIALYVITE Q000111Q) 0.8 MG TABS Take 1 tablet by mouth daily.  . benzoyl peroxide (BENZOYL PEROXIDE) 5 % external liquid Apply topically 2 (two) times daily.  . diclofenac sodium (VOLTAREN) 1 % GEL Apply 2 g topically 4 (four) times daily.  Marland Kitchen ezetimibe (ZETIA) 10 MG tablet TAKE 1 TABLET BY MOUTH EVERY DAY  . metoCLOPramide (REGLAN) 10 MG tablet TAKE 1 TABLET BY ORAL ROUTE 2 TIMES EVERY DAY 30 MINUTES BEFORE MEALS AND AT BEDTIME  . midodrine (PROAMATINE) 10 MG tablet Take 10 mg by mouth 3 (three) times a week. Dialysis days [facility] Saturday  . Omega-3 Fatty Acids (FISH OIL) 1000 MG CPDR Take 1,000 mg by mouth daily.   Marland Kitchen omeprazole (PRILOSEC) 20 MG capsule TAKE ONE CAPSULE EVERY DAY BEFORE MEALS  . rosuvastatin (CRESTOR) 40 MG tablet TAKE 1 TABLET BY MOUTH EVERY DAY  . Vitamin D, Ergocalciferol, 50 MCG (2000 UT) CAPS Take 2,000 Units by mouth daily.   Allergies  Allergen Reactions  . Sulfa Drugs Cross Reactors Other (See Comments)    Doesn't remember    Past Medical History:  Diagnosis Date  . Arthritis    "left knee" (12/04/2016)  . CAD (coronary artery disease)    a. 12/2016 NSTEMI/PCI: LM nl, LaD 20p/d, D1 20, OM2 20, OM3 20, RCA 49m (3.0x18 Resolute DES). EF 65%.  . Chronic lower back pain   . Diverticulosis    on CT  . ESRD on dialysis [facility])    "[facility]; [place]; TTS" (12/04/2016)  . GERD (gastroesophageal reflux disease)   . Hiatal hernia   . Hyperlipidemia    . Hypertension    a. 12/2016 labetalol d/c'd 2/2 hypotension in HD.  . Morbid obesity ([place])   . Obesity   . Pneumonia    "couple times" (12/04/2016)  . Renal insufficiency   . Type II diabetes mellitus (HCC)    Family History  Problem Relation Age of Onset  . Diabetes Mother   . Peripheral vascular disease Mother        amputation  . CAD Father   . Heart disease Father   . Hypertension Father   . Heart attack Father   . CAD Sister   . Cancer Brother        x 2 brothers, type unknown  . Breast cancer Sister    Past Surgical History:  Procedure Laterality Date  . ABDOMINAL HYSTERECTOMY    . AV FISTULA PLACEMENT Left 01/27/2014   Procedure: ARTERIOVENOUS (AV) FISTULA CREATION;  Surgeon: Mal Misty, MD;  Location: [place];  Service: Vascular;  Laterality: Left;  . AV FISTULA REPAIR     "had it cleaned out"  . CARDIAC CATHETERIZATION  ?1980s  . CARDIAC CATHETERIZATION N/A 12/04/2016   Procedure: Left Heart Cath Angiography;  Surgeon: Burnell Blanks, MD;  Location: [place] CV LAB;  Service: Cardiovascular;  Laterality: N/A;  . CARDIAC CATHETERIZATION N/A 12/04/2016   Procedure: Coronary Stent Intervention;  Surgeon: Burnell Blanks, MD;  Location: [place] CV LAB;  Service: Cardiovascular;  Laterality: N/A;  Mid RCA  . CATARACT EXTRACTION W/ INTRAOCULAR LENS IMPLANT Left   . COLONOSCOPY W/ BIOPSIES AND POLYPECTOMY    . CORONARY ANGIOPLASTY WITH STENT PLACEMENT  12/04/2016  . ESOPHAGOGASTRODUODENOSCOPY    . INSERTION OF DIALYSIS CATHETER N/A 01/19/2014   Procedure: INSERTION OF DIALYSIS CATHETER;  Surgeon: Rosetta Posner, MD;  Location: [facility] OR;  Service: Vascular;  Laterality: N/A;   Social History   Socioeconomic History  . Marital status: Married    Spouse name: Not on file  . Number of children: 4  . Years of education: Not on file  . Highest education level: Not on file  Occupational History  . Occupation: retired  Scientific laboratory technician  . Financial resource  strain: Not hard at all  . Food insecurity    Worry: Never true    Inability: Never true  . Transportation needs    Medical: No    Non-medical: No  Tobacco Use  . Smoking status: Former Smoker    Packs/day: 1.00    Years: 20.00    Pack years: 20.00    Types: Cigarettes    Quit date: 1985    Years since quitting: 35.6  . Smokeless tobacco: Never Used  Substance and Sexual Activity  . Alcohol use: No    Alcohol/week: 0.0 standard drinks  . Drug use: No  . Sexual activity: Not Currently  Lifestyle  .  Physical activity    Days per week: 7 days    Minutes per session: 10 min  . Stress: Not at all  Relationships  . Social Herbalist on phone: Not on file    Gets together: Not on file    Attends religious service: Not on file    Active member of club or organization: Not on file    Attends meetings of clubs or organizations: Not on file    Relationship status: Not on file  . Intimate partner violence    Fear of current or ex partner: No    Emotionally abused: No    Physically abused: No    Forced sexual activity: No  Other Topics Concern  . Not on file  Social History Narrative  . Not on file     Lipid Panel     Component Value Date/Time   CHOL 134 07/07/2019 0928   TRIG 110 07/07/2019 0928   HDL 44 07/07/2019 0928   CHOLHDL 3.0 07/07/2019 0928   CHOLHDL 3.2 12/04/2016 0528   VLDL 9 12/04/2016 0528   LDLCALC 68 07/07/2019 0928    Review of Systems: General: negative for chills, fever, night sweats or weight changes.  Cardiovascular: negative for chest pain, dyspnea on exertion, edema, orthopnea, palpitations, paroxysmal nocturnal dyspnea or shortness of breath Dermatological: negative for rash Respiratory: negative for cough or wheezing Urologic: negative for hematuria Abdominal: negative for nausea, vomiting, diarrhea, bright red blood per rectum, melena, or hematemesis Neurologic: negative for visual changes, syncope, or dizziness All other  systems reviewed and are otherwise negative except as noted above.   Physical Exam:  Blood pressure 120/64, pulse 80, height 5\' 4"  (1.626 m), weight 253 lb (114.8 kg), SpO2 98 %.  General appearance: alert, cooperative and no distress Neck: no carotid bruit and no JVD Lungs: clear to auscultation bilaterally Heart: regular rate and rhythm, S1, S2 normal, no murmur, click, rub or gallop Extremities: extremities normal, atraumatic, no cyanosis or edema Pulses: 2+ and symmetric Skin: Skin color, texture, turgor normal. No rashes or lesions Neurologic: Grossly normal  EKG not performed -- personally reviewed   ASSESSMENT AND PLAN:   1. CAD:  S/p NSTEMI in 2018 and underwent cath with PCI/DES to the RCA, residual disease LAD 20%, OM2/3 20%, D1 20%. EF was normal. Stable w/o anginal. No CP or dyspnea. BP well controlled. Lipids controlled w/ statin. On ASA. No  blocker due to hypotension.   2. ESRD: on HD TTS.  3. Hypotension: mainly w/ HD, treated w/ midodrine on HD days, TTS. Normotensive in clinic today at 120/60.  4. HLD w/ LDL Goal < 70 mg/dL: controlled w/ Crestor and Zetia. Recent Lipid panel 05/07/19 w/ LDL at 68 mg/dL. ALT normal. No changes made today.    Follow-Up w/ Dr. Radford Pax in 6 months   October Peery Ladoris Gene, MHS [facility] 07/16/2019 8:55 AM

## 2019-07-16 NOTE — Patient Instructions (Signed)
Medication Instructions:  none If you need a refill on your cardiac medications before your next appointment, please call your pharmacy.   Lab work: none If you have labs (blood work) drawn today and your tests are completely normal, you will receive your results only by: Marland Kitchen MyChart Message (if you have MyChart) OR . A paper copy in the mail If you have any lab test that is abnormal or we need to change your treatment, we will call you to review the results.  Testing/Procedures: none  Follow-Up: At [facility], you and your health needs are our priority.  As part of our continuing mission to provide you with exceptional heart care, we have created designated Provider Care Teams.  These Care Teams include your primary Cardiologist (physician) and Advanced Practice Providers (APPs -  Physician Assistants and Nurse Practitioners) who all work together to provide you with the care you need, when you need it. You will need a follow up appointment in 6 months.  Please call our office 2 months in advance to schedule this appointment.  You may see Fransico Him, MD or one of the following Advanced Practice Providers on your designated Care Team:   [place], PA-C Melina Copa, PA-C . Ermalinda Barrios, PA-C  Any Other Special Instructions Will Be Listed Below (If Applicable).

## 2019-07-21 ENCOUNTER — Other Ambulatory Visit: Payer: Self-pay | Admitting: Nurse Practitioner

## 2019-07-21 ENCOUNTER — Other Ambulatory Visit: Payer: Self-pay | Admitting: Cardiology

## 2019-07-28 ENCOUNTER — Encounter: Payer: Medicare Other | Admitting: Nurse Practitioner

## 2019-07-28 ENCOUNTER — Ambulatory Visit: Payer: Medicare Other

## 2019-08-01 ENCOUNTER — Encounter: Payer: Self-pay | Admitting: Nurse Practitioner

## 2019-08-05 ENCOUNTER — Other Ambulatory Visit: Payer: Self-pay | Admitting: Nurse Practitioner

## 2019-08-06 ENCOUNTER — Ambulatory Visit: Payer: Medicare Other | Admitting: Nurse Practitioner

## 2019-08-10 ENCOUNTER — Encounter: Payer: Medicare Other | Admitting: Nurse Practitioner

## 2019-08-31 ENCOUNTER — Encounter: Payer: Medicare Other | Admitting: Nurse Practitioner

## 2019-09-13 ENCOUNTER — Other Ambulatory Visit: Payer: Self-pay

## 2019-09-13 ENCOUNTER — Encounter: Payer: Self-pay | Admitting: Nurse Practitioner

## 2019-09-13 ENCOUNTER — Ambulatory Visit: Payer: Medicare Other | Admitting: Nurse Practitioner

## 2019-09-13 VITALS — BP 120/64 | HR 74 | Temp 98.8°F | Ht 64.0 in | Wt 254.0 lb

## 2019-09-13 DIAGNOSIS — N2581 Secondary hyperparathyroidism of renal origin: Secondary | ICD-10-CM

## 2019-09-13 DIAGNOSIS — Z23 Encounter for immunization: Secondary | ICD-10-CM | POA: Diagnosis not present

## 2019-09-13 DIAGNOSIS — I10 Essential (primary) hypertension: Secondary | ICD-10-CM

## 2019-09-13 DIAGNOSIS — Z1231 Encounter for screening mammogram for malignant neoplasm of breast: Secondary | ICD-10-CM | POA: Diagnosis not present

## 2019-09-13 DIAGNOSIS — I132 Hypertensive heart and chronic kidney disease with heart failure and with stage 5 chronic kidney disease, or end stage renal disease: Secondary | ICD-10-CM

## 2019-09-13 DIAGNOSIS — I509 Heart failure, unspecified: Secondary | ICD-10-CM

## 2019-09-13 DIAGNOSIS — Z0000 Encounter for general adult medical examination without abnormal findings: Secondary | ICD-10-CM

## 2019-09-13 DIAGNOSIS — E782 Mixed hyperlipidemia: Secondary | ICD-10-CM

## 2019-09-13 DIAGNOSIS — Z992 Dependence on renal dialysis: Secondary | ICD-10-CM

## 2019-09-13 DIAGNOSIS — Z1211 Encounter for screening for malignant neoplasm of colon: Secondary | ICD-10-CM | POA: Diagnosis not present

## 2019-09-13 DIAGNOSIS — N186 End stage renal disease: Secondary | ICD-10-CM

## 2019-09-13 NOTE — Progress Notes (Addendum)
Subjective:     Patient ID: Jacqueline [name] , female    DOB: February 21, 1946 , 73 y.o.   MRN: [id number]   Chief Complaint  Patient presents with  . Annual Exam    HPI  Wt Readings from Last 3 Encounters: 09/13/19 : 254 lb (115.2 kg) 07/16/19 : 253 lb (114.8 kg) 07/05/19 : 249 lb (112.9 kg)    The patient states she she is  status [name] hysterectomy  Mammogram last done 09/02/2018.  Negative for: breast discharge, breast lump(s), breast pain and breast self exam.  Pertinent negatives include abnormal bleeding (hematology), anxiety, decreased libido, depression, difficulty falling sleep, dyspareunia, history of infertility, nocturia, sexual dysfunction, sleep disturbances, urinary incontinence, urinary urgency, vaginal discharge and vaginal itching. Diet regular, she tries to avoid cheese, bananas, tomatoes, and potatoes.The patient states her exercise level is  she will do chair exercise.    The patient's tobacco use is:  Social History   Tobacco Use  Smoking Status Former Smoker  . Packs/day: 1.00  . Years: 20.00  . Pack years: 20.00  . Types: Cigarettes  . Quit date: 24  . Years since quitting: 35.8  Smokeless Tobacco Never Used   She has been exposed to passive smoke. The patient's alcohol use is:  Social History   Substance and Sexual Activity  Alcohol Use No  . Alcohol/week: 0.0 standard drinks    Past Medical History:  Diagnosis Date  . Arthritis    "left knee" (12/04/2016)  . CAD (coronary artery disease)    a. 12/2016 NSTEMI/PCI: LM nl, LaD 20p/d, D1 20, OM2 20, OM3 20, RCA 3m (3.0x18 Resolute DES). EF 65%.  . Chronic lower back pain   . Diverticulosis    on CT  . ESRD on dialysis [facility])    "[facility]; [place]; TTS" (12/04/2016)  . GERD (gastroesophageal reflux disease)   . Hiatal hernia   . Hyperlipidemia   . Hypertension    a. 12/2016 labetalol d/c'd 2/2 hypotension in HD.  . Morbid obesity ([place])   . Obesity   . Pneumonia    "couple times"  (12/04/2016)  . Renal insufficiency   . Type II diabetes mellitus (HCC)      Family History  Problem Relation Age of Onset  . Diabetes Mother   . Peripheral vascular disease Mother        amputation  . CAD Father   . Heart disease Father   . Hypertension Father   . Heart attack Father   . CAD Sister   . Cancer Brother        x 2 brothers, type unknown  . Breast cancer Sister      Current Outpatient Medications:  .  aspirin EC 81 MG EC tablet, Take 1 tablet (81 mg total) by mouth daily., Disp: , Rfl:  .  AURYXIA 1 GM 210 MG(Fe) tablet, Take 420 mg by mouth 2 (two) times a day., Disp: , Rfl:  .  B Complex-C-Folic Acid (DIALYVITE Q000111Q) 0.8 MG TABS, Take 1 tablet by mouth daily., Disp: , Rfl:  .  benzoyl peroxide (BENZOYL PEROXIDE) 5 % external liquid, Apply topically 2 (two) times daily., Disp: 142 g, Rfl: 12 .  diclofenac sodium (VOLTAREN) 1 % GEL, Apply 2 g topically 4 (four) times daily., Disp: 100 g, Rfl: 2 .  metoCLOPramide (REGLAN) 10 MG tablet, TAKE 1 TABLET BY ORAL ROUTE 2 TIMES EVERY DAY 30 MINUTES BEFORE MEALS AND AT BEDTIME, Disp: 60 tablet, Rfl: 1 .  midodrine (PROAMATINE) 10 MG tablet, Take 10 mg by mouth 3 (three) times a week. Dialysis days Tues, Thurs Saturday, Disp: , Rfl:  .  Omega-3 Fatty Acids (FISH OIL) 1000 MG CPDR, Take 1,000 mg by mouth daily. , Disp: , Rfl:  .  omeprazole (PRILOSEC) 20 MG capsule, TAKE ONE CAPSULE EVERY DAY BEFORE MEALS, Disp: 90 capsule, Rfl: 1 .  rosuvastatin (CRESTOR) 40 MG tablet, TAKE 1 TABLET BY MOUTH EVERY DAY, Disp: 90 tablet, Rfl: 1 .  Vitamin D, Ergocalciferol, 50 MCG (2000 UT) CAPS, Take 2,000 Units by mouth daily., Disp: , Rfl:    Allergies  Allergen Reactions  . Sulfa Drugs Cross Reactors Other (See Comments)    Doesn't remember      Review of Systems  Constitutional: Negative.   HENT: Negative.   Eyes: Negative.   Respiratory: Negative.   Cardiovascular: Negative.   Gastrointestinal: Negative.   Endocrine: Negative.    Genitourinary: Negative.        Anuric from hemodialysis TTh and Sat  Musculoskeletal: Negative.   Skin: Negative.   Allergic/Immunologic: Negative.   Neurological: Negative.   Hematological: Negative.   Psychiatric/Behavioral: Negative.      Today's Vitals   09/13/19 0921  BP: 120/64  Pulse: 74  Temp: 98.8 F (37.1 C)  TempSrc: Oral  Weight: 254 lb (115.2 kg)  Height: 5\' 4"  (1.626 m)  PainSc: 0-No pain   Body mass index is 43.6 kg/m.   Objective:  Physical Exam Vitals signs reviewed.  Constitutional:      Appearance: Normal appearance. She is well-developed.  HENT:     Head: Normocephalic and atraumatic.     Right Ear: Hearing, tympanic membrane, ear canal and external ear normal. There is no impacted cerumen.     Left Ear: Hearing, tympanic membrane, ear canal and external ear normal. There is no impacted cerumen.  Eyes:     General: Lids are normal.     Conjunctiva/sclera: Conjunctivae normal.     Pupils: Pupils are equal, round, and reactive to light.     Funduscopic exam:    Right eye: No papilledema.        Left eye: No papilledema.  Neck:     Musculoskeletal: Full passive range of motion without pain, normal range of motion and neck supple.     Thyroid: No thyroid mass.     Vascular: No carotid bruit.  Cardiovascular:     Rate and Rhythm: Normal rate and regular rhythm.     Pulses: Normal pulses.     Heart sounds: Normal heart sounds. No murmur.     Arteriovenous access: left arteriovenous access is present.    Comments: Positive thrill and bruit to AV fistula Pulmonary:     Effort: Pulmonary effort is normal. No respiratory distress.     Breath sounds: Normal breath sounds.  Abdominal:     General: Abdomen is flat. Bowel sounds are normal. There is distension.     Palpations: Abdomen is soft. There is no mass.     Tenderness: There is no abdominal tenderness.  Musculoskeletal: Normal range of motion.        General: No swelling.     Right lower  leg: No edema.     Left lower leg: No edema.  Skin:    General: Skin is warm and dry.     Capillary Refill: Capillary refill takes less than 2 seconds.     Comments: Skin to face is hyperpigmented and dry  Neurological:     General: No focal deficit present.     Mental Status: She is alert and oriented to person, place, and time.     Cranial Nerves: No cranial nerve deficit.     Sensory: No sensory deficit.  Psychiatric:        Mood and Affect: Mood normal.        Behavior: Behavior normal.        Thought Content: Thought content normal.        Judgment: Judgment normal.         Assessment And Plan:     1. Health maintenance examination . Behavior modifications discussed and diet history reviewed.  . Pt will continue to exercise regularly and modify diet with low GI, plant based foods and decrease intake of processed foods.  . Recommend intake of daily multivitamin, Vitamin D, and calcium.  . Recommend mammogram and colonoscopy for preventive screenings, as well as recommend immunizations that include pneumonia 23  2. Essential hypertension . B/P is well controlled without medications.  . CMP ordered to check renal function.  . The importance of regular exercise and dietary modification was stressed to the patient.  . Stressed importance of losing ten percent of her body weight to help with B/P control.  . The weight loss would help with decreasing cardiac and cancer risk as well.  - CMP14 + Anion Gap  3. Screening mammogram, encounter for  Pt instructed on Self Breast Exam.According to ACOG guidelines Women aged 33 and older are recommended to get an annual mammogram. Form completed and given to patient contact the [facility] for appointment scheduing.   Pt encouraged to get annual mammogram - MM DIGITAL SCREENING BILATERAL; Future  4. Encounter for screening colonoscopy  She does not want to do a colonoscopy and denies a history of abnormal colonoscopies or  family history of colon cancer  Will dos cologuard  - Cologuard  5. Encounter for immunization  - Pneumococcal polysaccharide vaccine 23-valent greater than or equal to 2yo subcutaneous/IM  6. Mixed hyperlipidemia  Chronic, controlled  Continue with current medications - CBC - Lipid panel  7. ESRD (end stage renal disease) on dialysis [facility])  Continue with dialysis on T Th and Sat  8. Chronic congestive heart failure, unspecified heart failure type ([place])  Stable  Continue follow up with cardiology  9. Secondary renal hyperparathyroidism [facility])  Being followed by nephrology   [name], FNP    THE PATIENT IS ENCOURAGED TO PRACTICE SOCIAL DISTANCING DUE TO THE COVID-19 PANDEMIC.

## 2019-09-13 NOTE — Patient Instructions (Signed)
Health Maintenance  Topic Date Due  . COLONOSCOPY  12/10/1993  . PNA vac Low Risk Adult (2 of 2 - PPSV23) 10/17/2011  . HEMOGLOBIN A1C  01/07/2020  . OPHTHALMOLOGY EXAM  02/10/2020  . FOOT EXAM  06/10/2020  . URINE MICROALBUMIN  09/12/2020  . TETANUS/TDAP  06/21/2023  . INFLUENZA VACCINE  Completed  . DEXA SCAN  Completed  . Hepatitis C Screening  Completed   Health Maintenance, Female Adopting a healthy lifestyle and getting preventive care are important in promoting health and wellness. Ask your health care provider about:  The right schedule for you to have regular tests and exams.  Things you can do on your own to prevent diseases and keep yourself healthy. What should I know about diet, weight, and exercise? Eat a healthy diet   Eat a diet that includes plenty of vegetables, fruits, low-fat dairy products, and lean protein.  Do not eat a lot of foods that are high in solid fats, added sugars, or sodium. Maintain a healthy weight Body mass index (BMI) is used to identify weight problems. It estimates body fat based on height and weight. Your health care provider can help determine your BMI and help you achieve or maintain a healthy weight. Get regular exercise Get regular exercise. This is one of the most important things you can do for your health. Most adults should:  Exercise for at least 150 minutes each week. The exercise should increase your heart rate and make you sweat (moderate-intensity exercise).  Do strengthening exercises at least twice a week. This is in addition to the moderate-intensity exercise.  Spend less time sitting. Even light physical activity can be beneficial. Watch cholesterol and blood lipids Have your blood tested for lipids and cholesterol at 75 years of age, then have this test every 5 years. Have your cholesterol levels checked more often if:  Your lipid or cholesterol levels are high.  You are older than 75 years of age.  You are at high  risk for heart disease. What should I know about cancer screening? Depending on your health history and family history, you may need to have cancer screening at various ages. This may include screening for:  Breast cancer.  Cervical cancer.  Colorectal cancer.  Skin cancer.  Lung cancer. What should I know about heart disease, diabetes, and high blood pressure? Blood pressure and heart disease  High blood pressure causes heart disease and increases the risk of stroke. This is more likely to develop in people who have high blood pressure readings, are of African descent, or are overweight.  Have your blood pressure checked: ? Every 3-5 years if you are 75-51 years of age. ? Every year if you are 75 years old or older. Diabetes Have regular diabetes screenings. This checks your fasting blood sugar level. Have the screening done:  Once every three years after age 75 if you are at a normal weight and have a low risk for diabetes.  More often and at a younger age if you are overweight or have a high risk for diabetes. What should I know about preventing infection? Hepatitis B If you have a higher risk for hepatitis B, you should be screened for this virus. Talk with your health care provider to find out if you are at risk for hepatitis B infection. Hepatitis C Testing is recommended for:  Everyone born from 1 through 1967.  Anyone with known risk factors for hepatitis C. Sexually transmitted infections (STIs)  Get  screened for STIs, including gonorrhea and chlamydia, if: ? You are sexually active and are younger than 76 years of age. ? You are older than 75 years of age and your health care provider tells you that you are at risk for this type of infection. ? Your sexual activity has changed since you were last screened, and you are at increased risk for chlamydia or gonorrhea. Ask your health care provider if you are at risk.  Ask your health care provider about whether you  are at high risk for HIV. Your health care provider may recommend a prescription medicine to help prevent HIV infection. If you choose to take medicine to prevent HIV, you should first get tested for HIV. You should then be tested every 3 months for as long as you are taking the medicine. Pregnancy  If you are about to stop having your period (premenopausal) and you may become pregnant, seek counseling before you get pregnant.  Take 400 to 800 micrograms (mcg) of folic acid every day if you become pregnant.  Ask for birth control (contraception) if you want to prevent pregnancy. Osteoporosis and menopause Osteoporosis is a disease in which the bones lose minerals and strength with aging. This can result in bone fractures. If you are 75 years old or older, or if you are at risk for osteoporosis and fractures, ask your health care provider if you should:  Be screened for bone loss.  Take a calcium or vitamin D supplement to lower your risk of fractures.  Be given hormone replacement therapy (HRT) to treat symptoms of menopause. Follow these instructions at home: Lifestyle  Do not use any products that contain nicotine or tobacco, such as cigarettes, e-cigarettes, and chewing tobacco. If you need help quitting, ask your health care provider.  Do not use street drugs.  Do not share needles.  Ask your health care provider for help if you need support or information about quitting drugs. Alcohol use  Do not drink alcohol if: ? Your health care provider tells you not to drink. ? You are pregnant, may be pregnant, or are planning to become pregnant.  If you drink alcohol: ? Limit how much you use to 0-1 drink a day. ? Limit intake if you are breastfeeding.  Be aware of how much alcohol is in your drink. In the [place] one drink equals one 12 oz bottle of beer (355 mL), one 5 oz glass of wine (148 mL), or one 1 oz glass of hard liquor (44 mL). General instructions  Schedule regular  health, dental, and eye exams.  Stay current with your vaccines.  Tell your health care provider if: ? You often feel depressed. ? You have ever been abused or do not feel safe at home. Summary  Adopting a healthy lifestyle and getting preventive care are important in promoting health and wellness.  Follow your health care provider's instructions about healthy diet, exercising, and getting tested or screened for diseases.  Follow your health care provider's instructions on monitoring your cholesterol and blood pressure. This information is not intended to replace advice given to you by your health care provider. Make sure you discuss any questions you have with your health care provider. Document Released: 06/03/2011 Document Revised: 11/11/2018 Document Reviewed: 11/11/2018 Elsevier Patient Education  2020 [employer].

## 2019-09-14 LAB — LIPID PANEL
Chol/HDL Ratio: 2.8 ratio (ref 0.0–4.4)
Cholesterol, Total: 125 mg/dL (ref 100–199)
HDL: 45 mg/dL (ref >39)
LDL Chol Calc (NIH): 61 mg/dL (ref 0–99)
Triglycerides: 101 mg/dL (ref 0–149)
VLDL Cholesterol Cal: 19 mg/dL (ref 5–40)

## 2019-09-14 LAB — CMP14 + ANION GAP
ALT: 35 IU/L — ABNORMAL HIGH (ref 0–32)
AST: 37 IU/L (ref 0–40)
Albumin/Globulin Ratio: 1.4 (ref 1.2–2.2)
Albumin: 4.1 g/dL (ref 3.7–4.7)
Alkaline Phosphatase: 107 IU/L (ref 39–117)
Anion Gap: 22 mmol/L — ABNORMAL HIGH (ref 10.0–18.0)
BUN/Creatinine Ratio: 4 — ABNORMAL LOW (ref 12–28)
BUN: 42 mg/dL — ABNORMAL HIGH (ref 8–27)
Bilirubin Total: 0.5 mg/dL (ref 0.0–1.2)
CO2: 27 mmol/L (ref 20–29)
Calcium: 9.8 mg/dL (ref 8.7–10.3)
Chloride: 94 mmol/L — ABNORMAL LOW (ref 96–106)
Creatinine, Ser: 9.87 mg/dL — ABNORMAL HIGH (ref 0.57–1.00)
GFR calc Af Amer: 4 mL/min/{1.73_m2} — ABNORMAL LOW (ref >59)
GFR calc non Af Amer: 3 mL/min/{1.73_m2} — ABNORMAL LOW (ref >59)
Globulin, Total: 2.9 g/dL (ref 1.5–4.5)
Glucose: 166 mg/dL — ABNORMAL HIGH (ref 65–99)
Potassium: 4.1 mmol/L (ref 3.5–5.2)
Sodium: 143 mmol/L (ref 134–144)
Total Protein: 7 g/dL (ref 6.0–8.5)

## 2019-09-14 LAB — CBC
Hematocrit: 41.8 % (ref 34.0–46.6)
Hemoglobin: 13.8 g/dL (ref 11.1–15.9)
MCH: 31.4 pg (ref 26.6–33.0)
MCHC: 33 g/dL (ref 31.5–35.7)
MCV: 95 fL (ref 79–97)
Platelets: 191 10*3/uL (ref 150–450)
RBC: 4.4 x10E6/uL (ref 3.77–5.28)
RDW: 12.7 % (ref 11.7–15.4)
WBC: 9 10*3/uL (ref 3.4–10.8)

## 2019-10-05 ENCOUNTER — Emergency Department (HOSPITAL_COMMUNITY)
Admission: EM | Admit: 2019-10-05 | Discharge: 2019-10-05 | Disposition: A | Discharge status: Home / Self Care | Payer: Medicare Other | Attending: Emergency Medicine | Admitting: Emergency Medicine

## 2019-10-05 ENCOUNTER — Other Ambulatory Visit: Payer: Self-pay

## 2019-10-05 ENCOUNTER — Encounter (HOSPITAL_COMMUNITY): Payer: Self-pay

## 2019-10-05 DIAGNOSIS — Z87891 Personal history of nicotine dependence: Secondary | ICD-10-CM | POA: Diagnosis not present

## 2019-10-05 DIAGNOSIS — Z79899 Other long term (current) drug therapy: Secondary | ICD-10-CM | POA: Insufficient documentation

## 2019-10-05 DIAGNOSIS — I951 Orthostatic hypotension: Secondary | ICD-10-CM | POA: Diagnosis not present

## 2019-10-05 DIAGNOSIS — N186 End stage renal disease: Secondary | ICD-10-CM | POA: Diagnosis not present

## 2019-10-05 DIAGNOSIS — Z7982 Long term (current) use of aspirin: Secondary | ICD-10-CM | POA: Diagnosis not present

## 2019-10-05 DIAGNOSIS — I509 Heart failure, unspecified: Secondary | ICD-10-CM | POA: Diagnosis not present

## 2019-10-05 DIAGNOSIS — R42 Dizziness and giddiness: Secondary | ICD-10-CM | POA: Diagnosis present

## 2019-10-05 DIAGNOSIS — I2510 Atherosclerotic heart disease of native coronary artery without angina pectoris: Secondary | ICD-10-CM | POA: Diagnosis not present

## 2019-10-05 DIAGNOSIS — E1122 Type 2 diabetes mellitus with diabetic chronic kidney disease: Secondary | ICD-10-CM | POA: Diagnosis not present

## 2019-10-05 DIAGNOSIS — I132 Hypertensive heart and chronic kidney disease with heart failure and with stage 5 chronic kidney disease, or end stage renal disease: Secondary | ICD-10-CM | POA: Diagnosis not present

## 2019-10-05 LAB — COMPREHENSIVE METABOLIC PANEL
ALT: 39 U/L (ref 0–44)
AST: 41 U/L (ref 15–41)
Albumin: 3.5 g/dL (ref 3.5–5.0)
Alkaline Phosphatase: 120 U/L (ref 38–126)
Anion gap: 22 — ABNORMAL HIGH (ref 5–15)
BUN: 16 mg/dL (ref 8–23)
CO2: 23 mmol/L (ref 22–32)
Calcium: 9.4 mg/dL (ref 8.9–10.3)
Chloride: 94 mmol/L — ABNORMAL LOW (ref 98–111)
Creatinine, Ser: 7.19 mg/dL — ABNORMAL HIGH (ref 0.44–1.00)
GFR calc Af Amer: 6 mL/min — ABNORMAL LOW (ref >60)
GFR calc non Af Amer: 5 mL/min — ABNORMAL LOW (ref >60)
Glucose, Bld: 173 mg/dL — ABNORMAL HIGH (ref 70–99)
Potassium: 3.3 mmol/L — ABNORMAL LOW (ref 3.5–5.1)
Sodium: 139 mmol/L (ref 135–145)
Total Bilirubin: 0.9 mg/dL (ref 0.3–1.2)
Total Protein: 8 g/dL (ref 6.5–8.1)

## 2019-10-05 LAB — CBC WITH DIFFERENTIAL/PLATELET
Abs Immature Granulocytes: 0.04 10*3/uL (ref 0.00–0.07)
Basophils Absolute: 0.1 10*3/uL (ref 0.0–0.1)
Basophils Relative: 1 %
Eosinophils Absolute: 0.1 10*3/uL (ref 0.0–0.5)
Eosinophils Relative: 1 %
HCT: 48.6 % — ABNORMAL HIGH (ref 36.0–46.0)
Hemoglobin: 14.8 g/dL (ref 12.0–15.0)
Immature Granulocytes: 0 %
Lymphocytes Relative: 13 %
Lymphs Abs: 1.9 10*3/uL (ref 0.7–4.0)
MCH: 31.2 pg (ref 26.0–34.0)
MCHC: 30.5 g/dL (ref 30.0–36.0)
MCV: 102.3 fL — ABNORMAL HIGH (ref 80.0–100.0)
Monocytes Absolute: 1.2 10*3/uL — ABNORMAL HIGH (ref 0.1–1.0)
Monocytes Relative: 8 %
Neutro Abs: 10.7 10*3/uL — ABNORMAL HIGH (ref 1.7–7.7)
Neutrophils Relative %: 77 %
Platelets: 171 10*3/uL (ref 150–400)
RBC: 4.75 MIL/uL (ref 3.87–5.11)
RDW: 14.6 % (ref 11.5–15.5)
WBC: 14.1 10*3/uL — ABNORMAL HIGH (ref 4.0–10.5)
nRBC: 0 % (ref 0.0–0.2)

## 2019-10-05 NOTE — ED Notes (Signed)
Patient's husband Gwyndolyn Saxon wanted to leave his number for contact so that he can come in once pt is in a room [phone]

## 2019-10-05 NOTE — ED Triage Notes (Signed)
Per [facility] pt from dialysis, called for hypotension and bradycardia. Pt in dialysis chair on their arrival, feeling dizzy at the beginning of dialysis that worsened when she stood. Palpable pulse at dialysis 40's. In the 80's w/EMS, 100/50. Received 200 ns. 20g RAC

## 2019-10-05 NOTE — ED Triage Notes (Signed)
Pt denies dizziness when EMS stood her up in triage. She received a full treatment today, 2.5 kilo's T/Th/Sat

## 2019-10-05 NOTE — ED Provider Notes (Signed)
[facility] DEPARTMENT Provider Note   CSN: [id number] Arrival date & time: 10/05/19  1144     History   Chief Complaint Chief Complaint  Patient presents with  . Hypotension    HPI RARITY MCGOWEN is a 73 y.o. female.       73 year old female presents after dialysis where she became dizzy.  Patient did complete her entire session with a stand up and her blood pressure dropped.  Patient also had transient bradycardia.  States that she did not have any pain in her chest.  Not become short of breath.  Did not lose consciousness.  EMS gave patient 200 cc of bolus and blood pressure improved greatly.  She now feels back to her baseline.  Denies any recent changes to her medications.  No recent history of GI bleeding.     Past Medical History:  Diagnosis Date  . Arthritis    "left knee" (12/04/2016)  . CAD (coronary artery disease)    a. 12/2016 NSTEMI/PCI: LM nl, LaD 20p/d, D1 20, OM2 20, OM3 20, RCA 33m (3.0x18 Resolute DES). EF 65%.  . Chronic lower back pain   . Diverticulosis    on CT  . ESRD on dialysis [facility])    "[facility]; [place]; TTS" (12/04/2016)  . GERD (gastroesophageal reflux disease)   . Hiatal hernia   . Hyperlipidemia   . Hypertension    a. 12/2016 labetalol d/c'd 2/2 hypotension in HD.  . Morbid obesity ([place])   . Obesity   . Pneumonia    "couple times" (12/04/2016)  . Renal insufficiency   . Type II diabetes mellitus [facility])     Patient Active Problem List   Diagnosis Date Noted  . Sepsis ([place]) 06/22/2019  . Pain due to onychomycosis of toenails of both feet 06/11/2019  . Callus 06/11/2019  . Mixed hyperlipidemia 03/22/2019  . Otalgia of both ears 03/22/2019  . Right arm pain 03/22/2019  . Bradycardia 10/01/2017  . Dyslipidemia 10/01/2017  . History of NSTEMI   . CAD S/P percutaneous coronary angioplasty 12/03/2016  . Pain in lower limb 08/26/2014  . HCAP (healthcare-associated pneumonia) 02/13/2014  .  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 OSA (obstructive sleep apnea) 01/21/2014  . 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[place]) 01/21/2014  . Acute encephalopathy 01/19/2014  . Congestive heart failure ([place]) 01/15/2014  . ESRD (end stage renal disease) on dialysis ([place]) 01/15/2014  . Hypertension 01/15/2014  . Diabetes mellitus ([place]) 01/15/2014  . Anemia 01/15/2014  . Onychomycosis 03/26/2013  . Pain in joint, ankle and foot 03/26/2013    Past Surgical History:  Procedure Laterality Date  . ABDOMINAL HYSTERECTOMY    . AV FISTULA PLACEMENT Left 01/27/2014   Procedure: ARTERIOVENOUS (AV) FISTULA CREATION;  Surgeon: Mal Misty, MD;  Location: [facility];  Service: Vascular;  Laterality: Left;  . AV FISTULA REPAIR     "had it cleaned out"  . CARDIAC CATHETERIZATION  ?1980s  . CARDIAC CATHETERIZATION N/A 12/04/2016   Procedure: Left Heart Cath Angiography;  Surgeon: Burnell Blanks, MD;  Location: [place] CV LAB;  Service: Cardiovascular;  Laterality: N/A;  . CARDIAC CATHETERIZATION N/A 12/04/2016   Procedure: Coronary Stent Intervention;  Surgeon: Burnell Blanks, MD;  Location: [facility] CV LAB;  Service: Cardiovascular;  Laterality: N/A;  Mid RCA  . CATARACT EXTRACTION W/ INTRAOCULAR LENS IMPLANT Left   . COLONOSCOPY W/ BIOPSIES AND POLYPECTOMY    . CORONARY ANGIOPLASTY WITH STENT PLACEMENT  12/04/2016  .  ESOPHAGOGASTRODUODENOSCOPY    . INSERTION OF DIALYSIS CATHETER N/A 01/19/2014   Procedure: INSERTION OF DIALYSIS CATHETER;  Surgeon: Rosetta Posner, MD;  Location: [facility] OR;  Service: Vascular;  Laterality: N/A;     OB History   No obstetric history on file.      Home Medications    Prior to Admission medications   Medication Sig Start Date End Date Taking? Authorizing Provider  aspirin EC 81 MG EC tablet Take 1 tablet (81 mg total) by mouth daily. 12/06/16   Geradine Girt, DO  AURYXIA 1 GM 210 MG(Fe) tablet Take 420 mg by mouth 2 (two) times a day.  06/08/19   [provider]  B Complex-C-Folic Acid (DIALYVITE Q000111Q) 0.8 MG TABS Take 1 tablet by mouth daily. 09/29/18   [provider]  benzoyl peroxide (BENZOYL PEROXIDE) 5 % external liquid Apply topically 2 (two) times daily. 11/20/18   [name], FNP  diclofenac sodium (VOLTAREN) 1 % GEL Apply 2 g topically 4 (four) times daily. 03/22/19   [name], FNP  metoCLOPramide (REGLAN) 10 MG tablet TAKE 1 TABLET BY ORAL ROUTE 2 TIMES EVERY DAY 30 MINUTES BEFORE MEALS AND AT BEDTIME 08/05/19   [name], FNP  midodrine (PROAMATINE) 10 MG tablet Take 10 mg by mouth 3 (three) times a week. Dialysis days [facility] Saturday    [provider]  Omega-3 Fatty Acids (FISH OIL) 1000 MG CPDR Take 1,000 mg by mouth daily.     [provider]  omeprazole (PRILOSEC) 20 MG capsule TAKE ONE CAPSULE EVERY DAY BEFORE MEALS 07/21/19   [name], FNP  rosuvastatin (CRESTOR) 40 MG tablet TAKE 1 TABLET BY MOUTH EVERY DAY 07/21/19   Sueanne Margarita, MD  Vitamin D, Ergocalciferol, 50 MCG (2000 UT) CAPS Take 2,000 Units by mouth daily.    [provider]    Family History Family History  Problem Relation Age of Onset  . Diabetes Mother   . Peripheral vascular disease Mother        amputation  . CAD Father   . Heart disease Father   . Hypertension Father   . Heart attack Father   . CAD Sister   . Cancer Brother        x 2 brothers, type unknown  . Breast cancer Sister     Social History Social History   Tobacco Use  . Smoking status: Former Smoker    Packs/day: 1.00    Years: 20.00    Pack years: 20.00    Types: Cigarettes    Quit date: 1985    Years since quitting: 35.8  . Smokeless tobacco: Never Used  Substance Use Topics  . Alcohol use: No    Alcohol/week: 0.0 standard drinks  . Drug use: No     Allergies   Sulfa drugs cross reactors   Review of Systems Review of Systems  All other systems reviewed and are negative.    Physical  Exam Updated Vital Signs BP (!) 103/59   Pulse 64   Temp 97.6 F (36.4 C) (Oral)   Resp 16   Ht 1.626 m (5\' 4" )   Wt 114.8 kg   SpO2 100%   BMI 43.43 kg/m   Physical Exam Vitals signs and nursing note reviewed.  Constitutional:      General: She is not in acute distress.    Appearance: Normal appearance. She is well-developed. She is not toxic-appearing.  HENT:     Head: Normocephalic  and atraumatic.  Eyes:     General: Lids are normal.     Conjunctiva/sclera: Conjunctivae normal.     Pupils: Pupils are equal, round, and reactive to light.  Neck:     Musculoskeletal: Normal range of motion and neck supple.     Thyroid: No thyroid mass.     Trachea: No tracheal deviation.  Cardiovascular:     Rate and Rhythm: Normal rate and regular rhythm.     Heart sounds: Normal heart sounds. No murmur. No gallop.   Pulmonary:     Effort: Pulmonary effort is normal. No respiratory distress.     Breath sounds: Normal breath sounds. No stridor. No decreased breath sounds, wheezing, rhonchi or rales.  Abdominal:     General: Bowel sounds are normal. There is no distension.     Palpations: Abdomen is soft.     Tenderness: There is no abdominal tenderness. There is no rebound.  Musculoskeletal: Normal range of motion.        General: No tenderness.  Skin:    General: Skin is warm and dry.     Findings: No abrasion or rash.  Neurological:     Mental Status: She is alert and oriented to person, place, and time.     GCS: GCS eye subscore is 4. GCS verbal subscore is 5. GCS motor subscore is 6.     Cranial Nerves: No cranial nerve deficit.     Sensory: No sensory deficit.  Psychiatric:        Speech: Speech normal.        Behavior: Behavior normal.      ED Treatments / Results  Labs (all labs ordered are listed, but only abnormal results are displayed) Labs Reviewed  CBC WITH DIFFERENTIAL/PLATELET - Abnormal; Notable for the following components:      Result Value   WBC 14.1 (*)     HCT 48.6 (*)    MCV 102.3 (*)    Neutro Abs 10.7 (*)    Monocytes Absolute 1.2 (*)    All other components within normal limits  COMPREHENSIVE METABOLIC PANEL    EKG None  Radiology No results found.  Procedures Procedures (including critical care time)  Medications Ordered in ED Medications - No data to display   Initial Impression / Assessment and Plan / ED Course  I have reviewed the triage vital signs and the nursing notes.  Pertinent labs & imaging results that were available during my care of the patient were reviewed by me and considered in my medical decision making (see chart for details).        Patient with good blood pressure here and negative orthostatics.  Suspect temporary volume depletion from over dialysis.  Labs are reassuring and will discharge home  Final Clinical Impressions(s) / ED Diagnoses   Final diagnoses:  None    ED Discharge Orders    None       Lacretia Leigh, MD 10/05/19 1419

## 2019-10-19 ENCOUNTER — Telehealth: Payer: Self-pay

## 2019-10-19 NOTE — Telephone Encounter (Signed)
I called patient to see If she completed her cologuard and she stated no because she doesn't know how to do it. I walked patient through the instructions and she understood it a little better and said she would try. I also offered for [place] to send her for a colonoscopy and she declined stating she doesn't like that either. YRL,RMA

## 2019-11-16 ENCOUNTER — Other Ambulatory Visit: Payer: Self-pay | Admitting: Cardiology

## 2019-11-22 ENCOUNTER — Other Ambulatory Visit: Payer: Self-pay

## 2019-11-22 ENCOUNTER — Ambulatory Visit
Admission: RE | Admit: 2019-11-22 | Discharge: 2019-11-22 | Disposition: A | Discharge status: Home / Self Care | Payer: Medicare Other | Source: Ambulatory Visit | Attending: Nurse Practitioner | Admitting: Nurse Practitioner

## 2019-11-22 DIAGNOSIS — Z1231 Encounter for screening mammogram for malignant neoplasm of breast: Secondary | ICD-10-CM

## 2019-12-05 DIAGNOSIS — E1129 Type 2 diabetes mellitus with other diabetic kidney complication: Secondary | ICD-10-CM | POA: Diagnosis not present

## 2019-12-05 DIAGNOSIS — Z992 Dependence on renal dialysis: Secondary | ICD-10-CM | POA: Diagnosis not present

## 2019-12-05 DIAGNOSIS — N2581 Secondary hyperparathyroidism of renal origin: Secondary | ICD-10-CM | POA: Diagnosis not present

## 2019-12-05 DIAGNOSIS — N186 End stage renal disease: Secondary | ICD-10-CM | POA: Diagnosis not present

## 2019-12-05 DIAGNOSIS — D689 Coagulation defect, unspecified: Secondary | ICD-10-CM | POA: Diagnosis not present

## 2019-12-07 DIAGNOSIS — N186 End stage renal disease: Secondary | ICD-10-CM | POA: Diagnosis not present

## 2019-12-07 DIAGNOSIS — N2581 Secondary hyperparathyroidism of renal origin: Secondary | ICD-10-CM | POA: Diagnosis not present

## 2019-12-07 DIAGNOSIS — D689 Coagulation defect, unspecified: Secondary | ICD-10-CM | POA: Diagnosis not present

## 2019-12-07 DIAGNOSIS — E1129 Type 2 diabetes mellitus with other diabetic kidney complication: Secondary | ICD-10-CM | POA: Diagnosis not present

## 2019-12-07 DIAGNOSIS — Z992 Dependence on renal dialysis: Secondary | ICD-10-CM | POA: Diagnosis not present

## 2019-12-09 DIAGNOSIS — N2581 Secondary hyperparathyroidism of renal origin: Secondary | ICD-10-CM | POA: Diagnosis not present

## 2019-12-09 DIAGNOSIS — Z992 Dependence on renal dialysis: Secondary | ICD-10-CM | POA: Diagnosis not present

## 2019-12-09 DIAGNOSIS — D689 Coagulation defect, unspecified: Secondary | ICD-10-CM | POA: Diagnosis not present

## 2019-12-09 DIAGNOSIS — E1129 Type 2 diabetes mellitus with other diabetic kidney complication: Secondary | ICD-10-CM | POA: Diagnosis not present

## 2019-12-09 DIAGNOSIS — N186 End stage renal disease: Secondary | ICD-10-CM | POA: Diagnosis not present

## 2019-12-11 DIAGNOSIS — N2581 Secondary hyperparathyroidism of renal origin: Secondary | ICD-10-CM | POA: Diagnosis not present

## 2019-12-11 DIAGNOSIS — D689 Coagulation defect, unspecified: Secondary | ICD-10-CM | POA: Diagnosis not present

## 2019-12-11 DIAGNOSIS — Z992 Dependence on renal dialysis: Secondary | ICD-10-CM | POA: Diagnosis not present

## 2019-12-11 DIAGNOSIS — N186 End stage renal disease: Secondary | ICD-10-CM | POA: Diagnosis not present

## 2019-12-11 DIAGNOSIS — E1129 Type 2 diabetes mellitus with other diabetic kidney complication: Secondary | ICD-10-CM | POA: Diagnosis not present

## 2019-12-13 ENCOUNTER — Ambulatory Visit: Payer: Medicare Other | Admitting: Nurse Practitioner

## 2019-12-14 DIAGNOSIS — D689 Coagulation defect, unspecified: Secondary | ICD-10-CM | POA: Diagnosis not present

## 2019-12-14 DIAGNOSIS — Z992 Dependence on renal dialysis: Secondary | ICD-10-CM | POA: Diagnosis not present

## 2019-12-14 DIAGNOSIS — N2581 Secondary hyperparathyroidism of renal origin: Secondary | ICD-10-CM | POA: Diagnosis not present

## 2019-12-14 DIAGNOSIS — N186 End stage renal disease: Secondary | ICD-10-CM | POA: Diagnosis not present

## 2019-12-14 DIAGNOSIS — E1129 Type 2 diabetes mellitus with other diabetic kidney complication: Secondary | ICD-10-CM | POA: Diagnosis not present

## 2019-12-15 ENCOUNTER — Encounter: Payer: Self-pay | Admitting: Podiatry

## 2019-12-15 ENCOUNTER — Other Ambulatory Visit: Payer: Self-pay

## 2019-12-15 ENCOUNTER — Ambulatory Visit (INDEPENDENT_AMBULATORY_CARE_PROVIDER_SITE_OTHER): Payer: Medicare Other | Admitting: Podiatry

## 2019-12-15 DIAGNOSIS — M79675 Pain in left toe(s): Secondary | ICD-10-CM | POA: Diagnosis not present

## 2019-12-15 DIAGNOSIS — B351 Tinea unguium: Secondary | ICD-10-CM

## 2019-12-15 DIAGNOSIS — L84 Corns and callosities: Secondary | ICD-10-CM

## 2019-12-15 DIAGNOSIS — M79674 Pain in right toe(s): Secondary | ICD-10-CM | POA: Diagnosis not present

## 2019-12-15 DIAGNOSIS — E1159 Type 2 diabetes mellitus with other circulatory complications: Secondary | ICD-10-CM

## 2019-12-15 NOTE — Progress Notes (Signed)
Complaint:  Visit Type: Patient returns to my office for continued preventative foot care services. Complaint: Patient states" my nails have grown long and thick and become painful to walk and wear shoes" Patient has been diagnosed with DM with angiopathy.  The patient presents for preventative foot care services. No changes to ROS.  Patient has not been seen in over 6 months.  Podiatric Exam: Vascular: dorsalis pedis and posterior tibial pulses are not  palpable bilateral. Capillary return is immediate. Temperature gradient is WNL. Skin turgor WNL  Sensorium: Normal Semmes Weinstein monofilament test. Normal tactile sensation bilaterally. Nail Exam: Pt has thick disfigured discolored nails with subungual debris noted bilateral entire nail hallux through fifth toenails Ulcer Exam: There is no evidence of ulcer or pre-ulcerative changes or infection. Orthopedic Exam: Muscle tone and strength are WNL. No limitations in general ROM. No crepitus or effusions noted. Foot type and digits show no abnormalities. Bony prominences are unremarkable. Skin: No Porokeratosis. No infection or ulcers.  Callus left hallux.  Diagnosis:  Onychomycosis, , Pain in right toe, pain in left toes Callus left hallux  Treatment & Plan Procedures and Treatment: Consent by patient was obtained for treatment procedures.   Debridement of mycotic and hypertrophic toenails, 1 through 5 bilateral and clearing of subungual debris. No ulceration, no infection noted. Debride callus left hallux. Return Visit-Office Procedure: Patient instructed to return to the office for a follow up visit prn  for continued evaluation and treatment.    Gardiner Barefoot DPM

## 2019-12-16 DIAGNOSIS — D689 Coagulation defect, unspecified: Secondary | ICD-10-CM | POA: Diagnosis not present

## 2019-12-16 DIAGNOSIS — Z992 Dependence on renal dialysis: Secondary | ICD-10-CM | POA: Diagnosis not present

## 2019-12-16 DIAGNOSIS — N2581 Secondary hyperparathyroidism of renal origin: Secondary | ICD-10-CM | POA: Diagnosis not present

## 2019-12-16 DIAGNOSIS — E1129 Type 2 diabetes mellitus with other diabetic kidney complication: Secondary | ICD-10-CM | POA: Diagnosis not present

## 2019-12-16 DIAGNOSIS — N186 End stage renal disease: Secondary | ICD-10-CM | POA: Diagnosis not present

## 2019-12-18 DIAGNOSIS — N2581 Secondary hyperparathyroidism of renal origin: Secondary | ICD-10-CM | POA: Diagnosis not present

## 2019-12-18 DIAGNOSIS — N186 End stage renal disease: Secondary | ICD-10-CM | POA: Diagnosis not present

## 2019-12-18 DIAGNOSIS — D689 Coagulation defect, unspecified: Secondary | ICD-10-CM | POA: Diagnosis not present

## 2019-12-18 DIAGNOSIS — Z992 Dependence on renal dialysis: Secondary | ICD-10-CM | POA: Diagnosis not present

## 2019-12-18 DIAGNOSIS — E1129 Type 2 diabetes mellitus with other diabetic kidney complication: Secondary | ICD-10-CM | POA: Diagnosis not present

## 2019-12-21 DIAGNOSIS — E1129 Type 2 diabetes mellitus with other diabetic kidney complication: Secondary | ICD-10-CM | POA: Diagnosis not present

## 2019-12-21 DIAGNOSIS — D689 Coagulation defect, unspecified: Secondary | ICD-10-CM | POA: Diagnosis not present

## 2019-12-21 DIAGNOSIS — Z992 Dependence on renal dialysis: Secondary | ICD-10-CM | POA: Diagnosis not present

## 2019-12-21 DIAGNOSIS — N2581 Secondary hyperparathyroidism of renal origin: Secondary | ICD-10-CM | POA: Diagnosis not present

## 2019-12-21 DIAGNOSIS — N186 End stage renal disease: Secondary | ICD-10-CM | POA: Diagnosis not present

## 2019-12-23 DIAGNOSIS — Z992 Dependence on renal dialysis: Secondary | ICD-10-CM | POA: Diagnosis not present

## 2019-12-23 DIAGNOSIS — N2581 Secondary hyperparathyroidism of renal origin: Secondary | ICD-10-CM | POA: Diagnosis not present

## 2019-12-23 DIAGNOSIS — D689 Coagulation defect, unspecified: Secondary | ICD-10-CM | POA: Diagnosis not present

## 2019-12-23 DIAGNOSIS — E1129 Type 2 diabetes mellitus with other diabetic kidney complication: Secondary | ICD-10-CM | POA: Diagnosis not present

## 2019-12-23 DIAGNOSIS — N186 End stage renal disease: Secondary | ICD-10-CM | POA: Diagnosis not present

## 2019-12-25 ENCOUNTER — Other Ambulatory Visit: Payer: Self-pay | Admitting: Nurse Practitioner

## 2019-12-25 DIAGNOSIS — N186 End stage renal disease: Secondary | ICD-10-CM | POA: Diagnosis not present

## 2019-12-25 DIAGNOSIS — D689 Coagulation defect, unspecified: Secondary | ICD-10-CM | POA: Diagnosis not present

## 2019-12-25 DIAGNOSIS — Z992 Dependence on renal dialysis: Secondary | ICD-10-CM | POA: Diagnosis not present

## 2019-12-25 DIAGNOSIS — N2581 Secondary hyperparathyroidism of renal origin: Secondary | ICD-10-CM | POA: Diagnosis not present

## 2019-12-25 DIAGNOSIS — E1129 Type 2 diabetes mellitus with other diabetic kidney complication: Secondary | ICD-10-CM | POA: Diagnosis not present

## 2019-12-28 DIAGNOSIS — E1129 Type 2 diabetes mellitus with other diabetic kidney complication: Secondary | ICD-10-CM | POA: Diagnosis not present

## 2019-12-28 DIAGNOSIS — D689 Coagulation defect, unspecified: Secondary | ICD-10-CM | POA: Diagnosis not present

## 2019-12-28 DIAGNOSIS — Z992 Dependence on renal dialysis: Secondary | ICD-10-CM | POA: Diagnosis not present

## 2019-12-28 DIAGNOSIS — N186 End stage renal disease: Secondary | ICD-10-CM | POA: Diagnosis not present

## 2019-12-28 DIAGNOSIS — N2581 Secondary hyperparathyroidism of renal origin: Secondary | ICD-10-CM | POA: Diagnosis not present

## 2019-12-30 DIAGNOSIS — D689 Coagulation defect, unspecified: Secondary | ICD-10-CM | POA: Diagnosis not present

## 2019-12-30 DIAGNOSIS — N2581 Secondary hyperparathyroidism of renal origin: Secondary | ICD-10-CM | POA: Diagnosis not present

## 2019-12-30 DIAGNOSIS — Z992 Dependence on renal dialysis: Secondary | ICD-10-CM | POA: Diagnosis not present

## 2019-12-30 DIAGNOSIS — E1129 Type 2 diabetes mellitus with other diabetic kidney complication: Secondary | ICD-10-CM | POA: Diagnosis not present

## 2019-12-30 DIAGNOSIS — N186 End stage renal disease: Secondary | ICD-10-CM | POA: Diagnosis not present

## 2019-12-31 ENCOUNTER — Telehealth: Payer: Self-pay | Admitting: Cardiology

## 2019-12-31 NOTE — Telephone Encounter (Signed)
We are recommending the COVID-19 vaccine to all of our patients. Cardiac medications (including blood thinners) should not deter anyone from being vaccinated and there is no need to hold any of those medications prior to vaccine administration.     Currently, there is a hotline to call (active 12/10/19) to schedule vaccination appointments as no walk-ins will be accepted.   Number: [phone].    If an appointment is not available please go to [email or web address] to sign up for notification when additional vaccine appointments are available.   If you have further questions or concerns about the vaccine process, please visit [email or web address] or contact your primary care physician.   I have informed patient of instructions.   

## 2020-01-01 DIAGNOSIS — D689 Coagulation defect, unspecified: Secondary | ICD-10-CM | POA: Diagnosis not present

## 2020-01-01 DIAGNOSIS — N186 End stage renal disease: Secondary | ICD-10-CM | POA: Diagnosis not present

## 2020-01-01 DIAGNOSIS — N2581 Secondary hyperparathyroidism of renal origin: Secondary | ICD-10-CM | POA: Diagnosis not present

## 2020-01-01 DIAGNOSIS — E1129 Type 2 diabetes mellitus with other diabetic kidney complication: Secondary | ICD-10-CM | POA: Diagnosis not present

## 2020-01-01 DIAGNOSIS — Z992 Dependence on renal dialysis: Secondary | ICD-10-CM | POA: Diagnosis not present

## 2020-01-02 DIAGNOSIS — N186 End stage renal disease: Secondary | ICD-10-CM | POA: Diagnosis not present

## 2020-01-02 DIAGNOSIS — Z992 Dependence on renal dialysis: Secondary | ICD-10-CM | POA: Diagnosis not present

## 2020-01-02 DIAGNOSIS — E1129 Type 2 diabetes mellitus with other diabetic kidney complication: Secondary | ICD-10-CM | POA: Diagnosis not present

## 2020-01-03 ENCOUNTER — Telehealth: Payer: Self-pay | Admitting: Cardiology

## 2020-01-03 NOTE — Telephone Encounter (Signed)
Patient is calling stating the pharmacy reached out to her stating she had a new cholesterol medication to pick up. The pharmacy states it was sent in on 12/24/19. The patient would like to know if this was prescribed by Dr. Radford Pax and if so what it is and why. Please advise.

## 2020-01-03 NOTE — Telephone Encounter (Signed)
Patient states that her pharmacy called and told her that she had a medication that was ready for pick up. Patient can not remember the name of the medication, but wanted to know if Dr. Radford Pax had sent it in. Patient states that she was told it was a medication for cholesterol. Patient states that she is currently taking rosuvastatin 40 mg daily and Zetia 10 mg daily for her cholesterol. I looked through patient's medications and it does not look like any prescriptions were sent in to her pharmacy from our office recently. Patient states that she is not going to pick up the other medication and she is going to continue taking rosuvastatin and zetia daily.

## 2020-01-04 DIAGNOSIS — Z992 Dependence on renal dialysis: Secondary | ICD-10-CM | POA: Diagnosis not present

## 2020-01-04 DIAGNOSIS — N2581 Secondary hyperparathyroidism of renal origin: Secondary | ICD-10-CM | POA: Diagnosis not present

## 2020-01-04 DIAGNOSIS — D689 Coagulation defect, unspecified: Secondary | ICD-10-CM | POA: Diagnosis not present

## 2020-01-04 DIAGNOSIS — N186 End stage renal disease: Secondary | ICD-10-CM | POA: Diagnosis not present

## 2020-01-06 DIAGNOSIS — D689 Coagulation defect, unspecified: Secondary | ICD-10-CM | POA: Diagnosis not present

## 2020-01-06 DIAGNOSIS — N2581 Secondary hyperparathyroidism of renal origin: Secondary | ICD-10-CM | POA: Diagnosis not present

## 2020-01-06 DIAGNOSIS — Z992 Dependence on renal dialysis: Secondary | ICD-10-CM | POA: Diagnosis not present

## 2020-01-06 DIAGNOSIS — N186 End stage renal disease: Secondary | ICD-10-CM | POA: Diagnosis not present

## 2020-01-08 DIAGNOSIS — D689 Coagulation defect, unspecified: Secondary | ICD-10-CM | POA: Diagnosis not present

## 2020-01-08 DIAGNOSIS — Z992 Dependence on renal dialysis: Secondary | ICD-10-CM | POA: Diagnosis not present

## 2020-01-08 DIAGNOSIS — N186 End stage renal disease: Secondary | ICD-10-CM | POA: Diagnosis not present

## 2020-01-08 DIAGNOSIS — N2581 Secondary hyperparathyroidism of renal origin: Secondary | ICD-10-CM | POA: Diagnosis not present

## 2020-01-11 ENCOUNTER — Telehealth: Payer: Self-pay

## 2020-01-11 DIAGNOSIS — Z992 Dependence on renal dialysis: Secondary | ICD-10-CM | POA: Diagnosis not present

## 2020-01-11 DIAGNOSIS — D689 Coagulation defect, unspecified: Secondary | ICD-10-CM | POA: Diagnosis not present

## 2020-01-11 DIAGNOSIS — N186 End stage renal disease: Secondary | ICD-10-CM | POA: Diagnosis not present

## 2020-01-11 DIAGNOSIS — N2581 Secondary hyperparathyroidism of renal origin: Secondary | ICD-10-CM | POA: Diagnosis not present

## 2020-01-11 NOTE — Telephone Encounter (Signed)
Pt called to get information on COVID vaccine. Number given to pt

## 2020-01-13 DIAGNOSIS — N2581 Secondary hyperparathyroidism of renal origin: Secondary | ICD-10-CM | POA: Diagnosis not present

## 2020-01-13 DIAGNOSIS — Z992 Dependence on renal dialysis: Secondary | ICD-10-CM | POA: Diagnosis not present

## 2020-01-13 DIAGNOSIS — N186 End stage renal disease: Secondary | ICD-10-CM | POA: Diagnosis not present

## 2020-01-13 DIAGNOSIS — D689 Coagulation defect, unspecified: Secondary | ICD-10-CM | POA: Diagnosis not present

## 2020-01-15 ENCOUNTER — Other Ambulatory Visit: Payer: Self-pay | Admitting: Nurse Practitioner

## 2020-01-15 DIAGNOSIS — N186 End stage renal disease: Secondary | ICD-10-CM | POA: Diagnosis not present

## 2020-01-15 DIAGNOSIS — Z992 Dependence on renal dialysis: Secondary | ICD-10-CM | POA: Diagnosis not present

## 2020-01-15 DIAGNOSIS — N2581 Secondary hyperparathyroidism of renal origin: Secondary | ICD-10-CM | POA: Diagnosis not present

## 2020-01-15 DIAGNOSIS — D689 Coagulation defect, unspecified: Secondary | ICD-10-CM | POA: Diagnosis not present

## 2020-01-16 ENCOUNTER — Ambulatory Visit: Payer: BC Managed Care – PPO | Attending: Internal Medicine

## 2020-01-18 DIAGNOSIS — Z992 Dependence on renal dialysis: Secondary | ICD-10-CM | POA: Diagnosis not present

## 2020-01-18 DIAGNOSIS — N186 End stage renal disease: Secondary | ICD-10-CM | POA: Diagnosis not present

## 2020-01-18 DIAGNOSIS — D689 Coagulation defect, unspecified: Secondary | ICD-10-CM | POA: Diagnosis not present

## 2020-01-18 DIAGNOSIS — N2581 Secondary hyperparathyroidism of renal origin: Secondary | ICD-10-CM | POA: Diagnosis not present

## 2020-01-18 NOTE — Progress Notes (Signed)
Cardiology Office Note:    Date:  01/19/2020   ID:  Jacqueline [name], DOB 1946/10/29, MRN [id number]  PCP:  Jacqueline Brine, FNP  Cardiologist:  Jacqueline Him, MD    Referring MD: Jacqueline Brine, FNP   Chief Complaint  Patient presents with  . Coronary Artery Disease  . Hyperlipidemia  . Hypertension    History of Present Illness:    Jacqueline [name] is a 74 y.o. female with a hx of CAD status post NSTEMI 12/2016 treated with DES to the RCA. Post MI she had trouble with hypotension on HD and labetalol was stopped. She also has hypertension, ESRD on HD, dyslipidemia, obesity hypoventilation syndrome.  She was admitted 7/21-7/23 for hypotension during HD w/ SBP dropping into the 70s. She was transported to the ED and given IVFs and admitted by IM. No chest pain, but troponins were cycled and found to be abnormal. HsTrop 43>>58>>96>>96. EKG showed SR with nonspecific TWI, no acute ischemia. Cardiology consulted for elevated troponin, which was felt to be demand ischemia in the setting of hypotension and CKD. 2D echo was normal. In the setting of absent ischemic symptoms, low level- flat trend troponin and normal echo, no other inpatient cardiac w/u was indicated.   She was seen back in the office by Jacqueline Jester, PA and was doing well.  No changes were made at that time.  She is here today for followup and is doing well.  She denies any chest pain or pressure, SOB, DOE, PND, orthopnea, LE edema, dizziness, palpitations or syncope. she is compliant with her meds and is tolerating meds with no SE.    Past Medical History:  Diagnosis Date  . Arthritis    "left knee" (12/04/2016)  . CAD (coronary artery disease)    a. 12/2016 NSTEMI/PCI: LM nl, LaD 20p/d, D1 20, OM2 20, OM3 20, RCA 39m (3.0x18 Resolute DES). EF 65%.  . Chronic lower back pain   . Diverticulosis    on CT  . ESRD on dialysis [facility])    "[facility]; [place]; TTS" (12/04/2016)  . GERD (gastroesophageal reflux  disease)   . Hiatal hernia   . Hyperlipidemia   . Hypertension    a. 12/2016 labetalol d/c'd 2/2 hypotension in HD.  . Morbid obesity ([place])   . Obesity   . Pneumonia    "couple times" (12/04/2016)  . Renal insufficiency   . Type II diabetes mellitus ([place])     Past Surgical History:  Procedure Laterality Date  . ABDOMINAL HYSTERECTOMY    . AV FISTULA PLACEMENT Left 01/27/2014   Procedure: ARTERIOVENOUS (AV) FISTULA CREATION;  Surgeon: Mal Misty, MD;  Location: [facility];  Service: Vascular;  Laterality: Left;  . AV FISTULA REPAIR     "had it cleaned out"  . CARDIAC CATHETERIZATION  ?1980s  . CARDIAC CATHETERIZATION N/A 12/04/2016   Procedure: Left Heart Cath Angiography;  Surgeon: Burnell Blanks, MD;  Location: [facility] CV LAB;  Service: Cardiovascular;  Laterality: N/A;  . CARDIAC CATHETERIZATION N/A 12/04/2016   Procedure: Coronary Stent Intervention;  Surgeon: Burnell Blanks, MD;  Location: [facility] CV LAB;  Service: Cardiovascular;  Laterality: N/A;  Mid RCA  . CATARACT EXTRACTION W/ INTRAOCULAR LENS IMPLANT Left   . COLONOSCOPY W/ BIOPSIES AND POLYPECTOMY    . CORONARY ANGIOPLASTY WITH STENT PLACEMENT  12/04/2016  . ESOPHAGOGASTRODUODENOSCOPY    . INSERTION OF DIALYSIS CATHETER N/A 01/19/2014   Procedure: INSERTION OF DIALYSIS CATHETER;  Surgeon: Arvilla Meres  Early, MD;  Location: [facility] OR;  Service: Vascular;  Laterality: N/A;    Current Medications: Current Meds  Medication Sig  . aspirin EC 81 MG EC tablet Take 1 tablet (81 mg total) by mouth daily.  Lorin Picket 1 GM 210 MG(Fe) tablet Take 420 mg by mouth 2 (two) times a day.  . B Complex-C-Folic Acid (DIALYVITE Q000111Q) 0.8 MG TABS Take 1 tablet by mouth daily.  . benzoyl peroxide (BENZOYL PEROXIDE) 5 % external liquid Apply topically 2 (two) times daily.  . diclofenac sodium (VOLTAREN) 1 % GEL Apply 2 g topically 4 (four) times daily.  Marland Kitchen ezetimibe (ZETIA) 10 MG tablet Take 10 mg by mouth daily.  . metoCLOPramide  (REGLAN) 10 MG tablet TAKE 1 TABLET BY ORAL ROUTE 2 TIMES EVERY DAY 30 MINUTES BEFORE MEALS AND AT BEDTIME  . midodrine (PROAMATINE) 10 MG tablet Take 10 mg by mouth 3 (three) times a week. Dialysis days [facility] Saturday  . Omega-3 Fatty Acids (FISH OIL) 1000 MG CPDR Take 1,000 mg by mouth daily.   Marland Kitchen omeprazole (PRILOSEC) 20 MG capsule TAKE 1 CAPSULE BY MOUTH EVERY DAY BEFORE MEALS  . rosuvastatin (CRESTOR) 40 MG tablet TAKE 1 TABLET BY MOUTH EVERY DAY  . Vitamin D, Ergocalciferol, 50 MCG (2000 UT) CAPS Take 2,000 Units by mouth daily.     Allergies:   Other, Sulfa antibiotics, and Sulfa drugs cross reactors   Social History   Socioeconomic History  . Marital status: Married    Spouse name: Not on file  . Number of children: 4  . Years of education: Not on file  . Highest education level: Not on file  Occupational History  . Occupation: retired  Tobacco Use  . Smoking status: Former Smoker    Packs/day: 1.00    Years: 20.00    Pack years: 20.00    Types: Cigarettes    Quit date: 1985    Years since quitting: 36.1  . Smokeless tobacco: Never Used  Substance and Sexual Activity  . Alcohol use: No    Alcohol/week: 0.0 standard drinks  . Drug use: No  . Sexual activity: Not Currently  Other Topics Concern  . Not on file  Social History Narrative  . Not on file   Social Determinants of Health   Financial Resource Strain: Low Risk   . Difficulty of Paying Living Expenses: Not hard at all  Food Insecurity: No Food Insecurity  . Worried About Charity fundraiser in the Last Year: Never true  . Ran Out of Food in the Last Year: Never true  Transportation Needs: No Transportation Needs  . Lack of Transportation (Medical): No  . Lack of Transportation (Non-Medical): No  Physical Activity: Insufficiently Active  . Days of Exercise per Week: 7 days  . Minutes of Exercise per Session: 10 min  Stress: No Stress Concern Present  . Feeling of Stress : Not at all  Social  Connections:   . Frequency of Communication with Friends and Family: Not on file  . Frequency of Social Gatherings with Friends and Family: Not on file  . Attends Religious Services: Not on file  . Active Member of Clubs or Organizations: Not on file  . Attends Archivist Meetings: Not on file  . Marital Status: Not on file     Family History: The patient's family history includes Breast cancer in her sister; CAD in her father and sister; Cancer in her brother; Diabetes in her mother; Heart  attack in her father; Heart disease in her father; Hypertension in her father; Peripheral vascular disease in her mother.  ROS:   Please see the history of present illness.    ROS  All other systems reviewed and negative.   EKGs/Labs/Other Studies Reviewed:    The following studies were reviewed today: Outside labs from hospital, hospital and OV notes from last summer, outside labs on [facility]  EKG:  EKG is not ordered today.  T  Recent Labs: 10/05/2019: ALT 39; BUN 16; Creatinine, Ser 7.19; Hemoglobin 14.8; Platelets 171; Potassium 3.3; Sodium 139   Recent Lipid Panel    Component Value Date/Time   CHOL 125 09/13/2019 1010   TRIG 101 09/13/2019 1010   HDL 45 09/13/2019 1010   CHOLHDL 2.8 09/13/2019 1010   CHOLHDL 3.2 12/04/2016 0528   VLDL 9 12/04/2016 0528   LDLCALC 61 09/13/2019 1010    Physical Exam:    VS:  BP 92/60   Pulse 80   Ht 5\' 4"  (1.626 m)   Wt 258 lb 12.8 oz (117.4 kg)   SpO2 97%   BMI 44.42 kg/m     Wt Readings from Last 3 Encounters:  01/19/20 258 lb 12.8 oz (117.4 kg)  10/05/19 253 lb (114.8 kg)  09/13/19 254 lb (115.2 kg)     GEN:  Well nourished, well developed in no acute distress HEENT: Normal NECK: No JVD; No carotid bruits LYMPHATICS: No lymphadenopathy CARDIAC: RRR, no murmurs, rubs, gallops RESPIRATORY:  Clear to auscultation without rales, wheezing or rhonchi  ABDOMEN: Soft, non-tender, non-distended MUSCULOSKELETAL:  No edema; No  deformity  SKIN: Warm and dry NEUROLOGIC:  Alert and oriented x 3 PSYCHIATRIC:  Normal affect   ASSESSMENT:    1. CAD S/P percutaneous coronary angioplasty   2. ESRD (end stage renal disease) on dialysis ([place])   3. Orthostatic hypotension   4. Mixed hyperlipidemia   5. Bradycardia   6. Essential hypertension    PLAN:    In order of problems listed above:  1.  ASCAD - status post NSTEMI 12/2016 treated with DES to the RCA.  -she denies any anginal sx -continue ASA 81mg  daily and statin -no BB due to hypotension with HD and prior bracycardia  2.  ESRD -on HD per nephrology  3.  Hypotension -occurred with HD -this has resolved  4.  HLD -LDL goal < 70 -Outside labs from PCP were reviewed o [facility] showing an LDL was 68 in June 2020 -continue Crestor 40mg  daily and Zetia 10mg  daily  5.  Bradycardia -asymptomatic  6.  HTN -BP controlled -has not required any antihypertensive meds   Medication Adjustments/Labs and Tests Ordered: Current medicines are reviewed at length with the patient today.  Concerns regarding medicines are outlined above.  No orders of the defined types were placed in this encounter.  No orders of the defined types were placed in this encounter.   Signed, Jacqueline Him, MD  01/19/2020 10:03 AM    [place]

## 2020-01-19 ENCOUNTER — Ambulatory Visit (INDEPENDENT_AMBULATORY_CARE_PROVIDER_SITE_OTHER): Payer: Medicare Other | Admitting: Cardiology

## 2020-01-19 ENCOUNTER — Encounter: Payer: Self-pay | Admitting: Cardiology

## 2020-01-19 ENCOUNTER — Other Ambulatory Visit: Payer: Self-pay

## 2020-01-19 VITALS — BP 92/60 | HR 80 | Ht 64.0 in | Wt 258.8 lb

## 2020-01-19 DIAGNOSIS — N186 End stage renal disease: Secondary | ICD-10-CM

## 2020-01-19 DIAGNOSIS — I951 Orthostatic hypotension: Secondary | ICD-10-CM | POA: Diagnosis not present

## 2020-01-19 DIAGNOSIS — I10 Essential (primary) hypertension: Secondary | ICD-10-CM

## 2020-01-19 DIAGNOSIS — R001 Bradycardia, unspecified: Secondary | ICD-10-CM

## 2020-01-19 DIAGNOSIS — E782 Mixed hyperlipidemia: Secondary | ICD-10-CM

## 2020-01-19 DIAGNOSIS — Z992 Dependence on renal dialysis: Secondary | ICD-10-CM

## 2020-01-19 DIAGNOSIS — Z9861 Coronary angioplasty status: Secondary | ICD-10-CM

## 2020-01-19 DIAGNOSIS — I2510 Atherosclerotic heart disease of native coronary artery without angina pectoris: Secondary | ICD-10-CM

## 2020-01-19 NOTE — Patient Instructions (Signed)

## 2020-01-22 DIAGNOSIS — N186 End stage renal disease: Secondary | ICD-10-CM | POA: Diagnosis not present

## 2020-01-22 DIAGNOSIS — Z992 Dependence on renal dialysis: Secondary | ICD-10-CM | POA: Diagnosis not present

## 2020-01-22 DIAGNOSIS — N2581 Secondary hyperparathyroidism of renal origin: Secondary | ICD-10-CM | POA: Diagnosis not present

## 2020-01-22 DIAGNOSIS — D689 Coagulation defect, unspecified: Secondary | ICD-10-CM | POA: Diagnosis not present

## 2020-01-25 DIAGNOSIS — N186 End stage renal disease: Secondary | ICD-10-CM | POA: Diagnosis not present

## 2020-01-25 DIAGNOSIS — Z992 Dependence on renal dialysis: Secondary | ICD-10-CM | POA: Diagnosis not present

## 2020-01-25 DIAGNOSIS — N2581 Secondary hyperparathyroidism of renal origin: Secondary | ICD-10-CM | POA: Diagnosis not present

## 2020-01-25 DIAGNOSIS — D689 Coagulation defect, unspecified: Secondary | ICD-10-CM | POA: Diagnosis not present

## 2020-01-27 DIAGNOSIS — N186 End stage renal disease: Secondary | ICD-10-CM | POA: Diagnosis not present

## 2020-01-27 DIAGNOSIS — Z992 Dependence on renal dialysis: Secondary | ICD-10-CM | POA: Diagnosis not present

## 2020-01-27 DIAGNOSIS — N2581 Secondary hyperparathyroidism of renal origin: Secondary | ICD-10-CM | POA: Diagnosis not present

## 2020-01-27 DIAGNOSIS — D689 Coagulation defect, unspecified: Secondary | ICD-10-CM | POA: Diagnosis not present

## 2020-01-29 DIAGNOSIS — N186 End stage renal disease: Secondary | ICD-10-CM | POA: Diagnosis not present

## 2020-01-29 DIAGNOSIS — D689 Coagulation defect, unspecified: Secondary | ICD-10-CM | POA: Diagnosis not present

## 2020-01-29 DIAGNOSIS — Z992 Dependence on renal dialysis: Secondary | ICD-10-CM | POA: Diagnosis not present

## 2020-01-29 DIAGNOSIS — N2581 Secondary hyperparathyroidism of renal origin: Secondary | ICD-10-CM | POA: Diagnosis not present

## 2020-01-30 DIAGNOSIS — N186 End stage renal disease: Secondary | ICD-10-CM | POA: Diagnosis not present

## 2020-01-30 DIAGNOSIS — E1129 Type 2 diabetes mellitus with other diabetic kidney complication: Secondary | ICD-10-CM | POA: Diagnosis not present

## 2020-01-30 DIAGNOSIS — Z992 Dependence on renal dialysis: Secondary | ICD-10-CM | POA: Diagnosis not present

## 2020-02-01 DIAGNOSIS — Z992 Dependence on renal dialysis: Secondary | ICD-10-CM | POA: Diagnosis not present

## 2020-02-01 DIAGNOSIS — N186 End stage renal disease: Secondary | ICD-10-CM | POA: Diagnosis not present

## 2020-02-01 DIAGNOSIS — I159 Secondary hypertension, unspecified: Secondary | ICD-10-CM | POA: Diagnosis not present

## 2020-02-01 DIAGNOSIS — N2581 Secondary hyperparathyroidism of renal origin: Secondary | ICD-10-CM | POA: Diagnosis not present

## 2020-02-01 DIAGNOSIS — D689 Coagulation defect, unspecified: Secondary | ICD-10-CM | POA: Diagnosis not present

## 2020-02-03 DIAGNOSIS — N186 End stage renal disease: Secondary | ICD-10-CM | POA: Diagnosis not present

## 2020-02-03 DIAGNOSIS — I159 Secondary hypertension, unspecified: Secondary | ICD-10-CM | POA: Diagnosis not present

## 2020-02-03 DIAGNOSIS — N2581 Secondary hyperparathyroidism of renal origin: Secondary | ICD-10-CM | POA: Diagnosis not present

## 2020-02-03 DIAGNOSIS — Z992 Dependence on renal dialysis: Secondary | ICD-10-CM | POA: Diagnosis not present

## 2020-02-03 DIAGNOSIS — D689 Coagulation defect, unspecified: Secondary | ICD-10-CM | POA: Diagnosis not present

## 2020-02-05 DIAGNOSIS — N186 End stage renal disease: Secondary | ICD-10-CM | POA: Diagnosis not present

## 2020-02-05 DIAGNOSIS — D689 Coagulation defect, unspecified: Secondary | ICD-10-CM | POA: Diagnosis not present

## 2020-02-05 DIAGNOSIS — Z992 Dependence on renal dialysis: Secondary | ICD-10-CM | POA: Diagnosis not present

## 2020-02-05 DIAGNOSIS — I159 Secondary hypertension, unspecified: Secondary | ICD-10-CM | POA: Diagnosis not present

## 2020-02-05 DIAGNOSIS — N2581 Secondary hyperparathyroidism of renal origin: Secondary | ICD-10-CM | POA: Diagnosis not present

## 2020-02-08 DIAGNOSIS — N186 End stage renal disease: Secondary | ICD-10-CM | POA: Diagnosis not present

## 2020-02-08 DIAGNOSIS — N2581 Secondary hyperparathyroidism of renal origin: Secondary | ICD-10-CM | POA: Diagnosis not present

## 2020-02-08 DIAGNOSIS — Z992 Dependence on renal dialysis: Secondary | ICD-10-CM | POA: Diagnosis not present

## 2020-02-08 DIAGNOSIS — I159 Secondary hypertension, unspecified: Secondary | ICD-10-CM | POA: Diagnosis not present

## 2020-02-08 DIAGNOSIS — D689 Coagulation defect, unspecified: Secondary | ICD-10-CM | POA: Diagnosis not present

## 2020-02-10 DIAGNOSIS — I159 Secondary hypertension, unspecified: Secondary | ICD-10-CM | POA: Diagnosis not present

## 2020-02-10 DIAGNOSIS — N2581 Secondary hyperparathyroidism of renal origin: Secondary | ICD-10-CM | POA: Diagnosis not present

## 2020-02-10 DIAGNOSIS — Z992 Dependence on renal dialysis: Secondary | ICD-10-CM | POA: Diagnosis not present

## 2020-02-10 DIAGNOSIS — N186 End stage renal disease: Secondary | ICD-10-CM | POA: Diagnosis not present

## 2020-02-10 DIAGNOSIS — D689 Coagulation defect, unspecified: Secondary | ICD-10-CM | POA: Diagnosis not present

## 2020-02-12 DIAGNOSIS — D689 Coagulation defect, unspecified: Secondary | ICD-10-CM | POA: Diagnosis not present

## 2020-02-12 DIAGNOSIS — Z992 Dependence on renal dialysis: Secondary | ICD-10-CM | POA: Diagnosis not present

## 2020-02-12 DIAGNOSIS — N2581 Secondary hyperparathyroidism of renal origin: Secondary | ICD-10-CM | POA: Diagnosis not present

## 2020-02-12 DIAGNOSIS — I159 Secondary hypertension, unspecified: Secondary | ICD-10-CM | POA: Diagnosis not present

## 2020-02-12 DIAGNOSIS — N186 End stage renal disease: Secondary | ICD-10-CM | POA: Diagnosis not present

## 2020-02-15 ENCOUNTER — Telehealth: Payer: Self-pay | Admitting: Cardiology

## 2020-02-15 ENCOUNTER — Other Ambulatory Visit: Payer: Self-pay | Admitting: Cardiology

## 2020-02-15 DIAGNOSIS — N186 End stage renal disease: Secondary | ICD-10-CM | POA: Diagnosis not present

## 2020-02-15 DIAGNOSIS — Z992 Dependence on renal dialysis: Secondary | ICD-10-CM | POA: Diagnosis not present

## 2020-02-15 DIAGNOSIS — D689 Coagulation defect, unspecified: Secondary | ICD-10-CM | POA: Diagnosis not present

## 2020-02-15 DIAGNOSIS — N2581 Secondary hyperparathyroidism of renal origin: Secondary | ICD-10-CM | POA: Diagnosis not present

## 2020-02-15 DIAGNOSIS — I159 Secondary hypertension, unspecified: Secondary | ICD-10-CM | POA: Diagnosis not present

## 2020-02-15 MED ORDER — EZETIMIBE 10 MG PO TABS: 10.0000 mg | ORAL_TABLET | Freq: Every day | ORAL | 3 refills | Status: DC

## 2020-02-15 MED ORDER — ROSUVASTATIN CALCIUM 40 MG PO TABS: 40.0000 mg | ORAL_TABLET | Freq: Every day | ORAL | 3 refills | Status: DC

## 2020-02-15 NOTE — Telephone Encounter (Signed)
  Pt c/o medication issue:  1. Name of Medication:   rosuvastatin (CRESTOR) 40 MG tablet  ezetimibe (ZETIA) 10 MG tablet  2. How are you currently taking this medication (dosage and times per day)? As written  3. Are you having a reaction (difficulty breathing--STAT)?   4. What is your medication issue? Pt was advised by pharmacy prescription needs prior auth and for Dr. Radford Pax to call [facility] phone# [phone]

## 2020-02-17 DIAGNOSIS — D689 Coagulation defect, unspecified: Secondary | ICD-10-CM | POA: Diagnosis not present

## 2020-02-17 DIAGNOSIS — N2581 Secondary hyperparathyroidism of renal origin: Secondary | ICD-10-CM | POA: Diagnosis not present

## 2020-02-17 DIAGNOSIS — N186 End stage renal disease: Secondary | ICD-10-CM | POA: Diagnosis not present

## 2020-02-17 DIAGNOSIS — I159 Secondary hypertension, unspecified: Secondary | ICD-10-CM | POA: Diagnosis not present

## 2020-02-17 DIAGNOSIS — Z992 Dependence on renal dialysis: Secondary | ICD-10-CM | POA: Diagnosis not present

## 2020-02-17 NOTE — Telephone Encounter (Signed)
**Note De-Identified Cregg Jutte Obfuscation** I did PAs on the pts Ezetimibe and Rosuvastatin through covermymeds and received the following message for both: Joslynn Jamroz Key: [id number] - PA Case ID: [id number] Need  Outcome This medication or product is on your plan's list of covered drugs.  Prior authorization is not required at this time.If  your pharmacy has questions regarding the processing of your prescription, please have them call the [facility] pharmacy help desk at ([phone]. **Please note: Formulary lowering, tiering exception, cost reduction and/or pre-benefit determination review (including prospective Medicare hospice reviews) requests cannot be requested using this method of submission. Please contact us at [phone] instead.

## 2020-02-19 DIAGNOSIS — Z992 Dependence on renal dialysis: Secondary | ICD-10-CM | POA: Diagnosis not present

## 2020-02-19 DIAGNOSIS — N2581 Secondary hyperparathyroidism of renal origin: Secondary | ICD-10-CM | POA: Diagnosis not present

## 2020-02-19 DIAGNOSIS — D689 Coagulation defect, unspecified: Secondary | ICD-10-CM | POA: Diagnosis not present

## 2020-02-19 DIAGNOSIS — I159 Secondary hypertension, unspecified: Secondary | ICD-10-CM | POA: Diagnosis not present

## 2020-02-19 DIAGNOSIS — N186 End stage renal disease: Secondary | ICD-10-CM | POA: Diagnosis not present

## 2020-02-22 DIAGNOSIS — N186 End stage renal disease: Secondary | ICD-10-CM | POA: Diagnosis not present

## 2020-02-22 DIAGNOSIS — Z992 Dependence on renal dialysis: Secondary | ICD-10-CM | POA: Diagnosis not present

## 2020-02-22 DIAGNOSIS — N2581 Secondary hyperparathyroidism of renal origin: Secondary | ICD-10-CM | POA: Diagnosis not present

## 2020-02-22 DIAGNOSIS — I159 Secondary hypertension, unspecified: Secondary | ICD-10-CM | POA: Diagnosis not present

## 2020-02-22 DIAGNOSIS — D689 Coagulation defect, unspecified: Secondary | ICD-10-CM | POA: Diagnosis not present

## 2020-02-24 DIAGNOSIS — N2581 Secondary hyperparathyroidism of renal origin: Secondary | ICD-10-CM | POA: Diagnosis not present

## 2020-02-24 DIAGNOSIS — N186 End stage renal disease: Secondary | ICD-10-CM | POA: Diagnosis not present

## 2020-02-24 DIAGNOSIS — D689 Coagulation defect, unspecified: Secondary | ICD-10-CM | POA: Diagnosis not present

## 2020-02-24 DIAGNOSIS — Z992 Dependence on renal dialysis: Secondary | ICD-10-CM | POA: Diagnosis not present

## 2020-02-24 DIAGNOSIS — I159 Secondary hypertension, unspecified: Secondary | ICD-10-CM | POA: Diagnosis not present

## 2020-02-26 DIAGNOSIS — D689 Coagulation defect, unspecified: Secondary | ICD-10-CM | POA: Diagnosis not present

## 2020-02-26 DIAGNOSIS — I159 Secondary hypertension, unspecified: Secondary | ICD-10-CM | POA: Diagnosis not present

## 2020-02-26 DIAGNOSIS — N186 End stage renal disease: Secondary | ICD-10-CM | POA: Diagnosis not present

## 2020-02-26 DIAGNOSIS — Z992 Dependence on renal dialysis: Secondary | ICD-10-CM | POA: Diagnosis not present

## 2020-02-26 DIAGNOSIS — N2581 Secondary hyperparathyroidism of renal origin: Secondary | ICD-10-CM | POA: Diagnosis not present

## 2020-02-29 ENCOUNTER — Other Ambulatory Visit: Payer: Self-pay

## 2020-02-29 DIAGNOSIS — I159 Secondary hypertension, unspecified: Secondary | ICD-10-CM | POA: Diagnosis not present

## 2020-02-29 DIAGNOSIS — N186 End stage renal disease: Secondary | ICD-10-CM | POA: Diagnosis not present

## 2020-02-29 DIAGNOSIS — N2581 Secondary hyperparathyroidism of renal origin: Secondary | ICD-10-CM | POA: Diagnosis not present

## 2020-02-29 DIAGNOSIS — Z992 Dependence on renal dialysis: Secondary | ICD-10-CM | POA: Diagnosis not present

## 2020-02-29 DIAGNOSIS — D689 Coagulation defect, unspecified: Secondary | ICD-10-CM | POA: Diagnosis not present

## 2020-03-01 DIAGNOSIS — E1129 Type 2 diabetes mellitus with other diabetic kidney complication: Secondary | ICD-10-CM | POA: Diagnosis not present

## 2020-03-01 DIAGNOSIS — N186 End stage renal disease: Secondary | ICD-10-CM | POA: Diagnosis not present

## 2020-03-01 DIAGNOSIS — Z992 Dependence on renal dialysis: Secondary | ICD-10-CM | POA: Diagnosis not present

## 2020-03-02 DIAGNOSIS — E1129 Type 2 diabetes mellitus with other diabetic kidney complication: Secondary | ICD-10-CM | POA: Diagnosis not present

## 2020-03-02 DIAGNOSIS — N186 End stage renal disease: Secondary | ICD-10-CM | POA: Diagnosis not present

## 2020-03-02 DIAGNOSIS — D689 Coagulation defect, unspecified: Secondary | ICD-10-CM | POA: Diagnosis not present

## 2020-03-02 DIAGNOSIS — N2581 Secondary hyperparathyroidism of renal origin: Secondary | ICD-10-CM | POA: Diagnosis not present

## 2020-03-02 DIAGNOSIS — Z992 Dependence on renal dialysis: Secondary | ICD-10-CM | POA: Diagnosis not present

## 2020-03-04 DIAGNOSIS — N2581 Secondary hyperparathyroidism of renal origin: Secondary | ICD-10-CM | POA: Diagnosis not present

## 2020-03-04 DIAGNOSIS — E1129 Type 2 diabetes mellitus with other diabetic kidney complication: Secondary | ICD-10-CM | POA: Diagnosis not present

## 2020-03-04 DIAGNOSIS — D689 Coagulation defect, unspecified: Secondary | ICD-10-CM | POA: Diagnosis not present

## 2020-03-04 DIAGNOSIS — N186 End stage renal disease: Secondary | ICD-10-CM | POA: Diagnosis not present

## 2020-03-04 DIAGNOSIS — Z992 Dependence on renal dialysis: Secondary | ICD-10-CM | POA: Diagnosis not present

## 2020-03-07 DIAGNOSIS — N2581 Secondary hyperparathyroidism of renal origin: Secondary | ICD-10-CM | POA: Diagnosis not present

## 2020-03-07 DIAGNOSIS — D689 Coagulation defect, unspecified: Secondary | ICD-10-CM | POA: Diagnosis not present

## 2020-03-07 DIAGNOSIS — E1129 Type 2 diabetes mellitus with other diabetic kidney complication: Secondary | ICD-10-CM | POA: Diagnosis not present

## 2020-03-07 DIAGNOSIS — N186 End stage renal disease: Secondary | ICD-10-CM | POA: Diagnosis not present

## 2020-03-07 DIAGNOSIS — Z992 Dependence on renal dialysis: Secondary | ICD-10-CM | POA: Diagnosis not present

## 2020-03-08 ENCOUNTER — Telehealth: Payer: Self-pay | Admitting: Cardiology

## 2020-03-08 ENCOUNTER — Other Ambulatory Visit: Payer: Self-pay

## 2020-03-08 MED ORDER — METOCLOPRAMIDE HCL 10 MG PO TABS: ORAL_TABLET | ORAL | 1 refills | Status: DC

## 2020-03-08 NOTE — Telephone Encounter (Signed)
Called pt to inform her that Dr. Radford Pax did not prescribe this medication metoclopramide, that she would have to contact her PCP for a refill on this medication. I advised the pt that if she has any other problems, questions or concerns, to please give our office a call back. Pt verbalized understanding.

## 2020-03-08 NOTE — Telephone Encounter (Signed)
New message      *STAT* If patient is at the pharmacy, call can be transferred to refill team.   1. Which medications need to be refilled? (please list name of each medication and dose if known)  Metoclopramide 10mg   2. Which pharmacy/location (including street and city if local pharmacy) is medication to be sent to[facility]   Please call [phone]---pt has been out of medication for 1 week and this is a new pharmacy  3. Do they need a 30 day or 90 day supply? 90 day

## 2020-03-09 DIAGNOSIS — E1129 Type 2 diabetes mellitus with other diabetic kidney complication: Secondary | ICD-10-CM | POA: Diagnosis not present

## 2020-03-09 DIAGNOSIS — N2581 Secondary hyperparathyroidism of renal origin: Secondary | ICD-10-CM | POA: Diagnosis not present

## 2020-03-09 DIAGNOSIS — N186 End stage renal disease: Secondary | ICD-10-CM | POA: Diagnosis not present

## 2020-03-09 DIAGNOSIS — D689 Coagulation defect, unspecified: Secondary | ICD-10-CM | POA: Diagnosis not present

## 2020-03-09 DIAGNOSIS — Z992 Dependence on renal dialysis: Secondary | ICD-10-CM | POA: Diagnosis not present

## 2020-03-11 DIAGNOSIS — N2581 Secondary hyperparathyroidism of renal origin: Secondary | ICD-10-CM | POA: Diagnosis not present

## 2020-03-11 DIAGNOSIS — E1129 Type 2 diabetes mellitus with other diabetic kidney complication: Secondary | ICD-10-CM | POA: Diagnosis not present

## 2020-03-11 DIAGNOSIS — D689 Coagulation defect, unspecified: Secondary | ICD-10-CM | POA: Diagnosis not present

## 2020-03-11 DIAGNOSIS — N186 End stage renal disease: Secondary | ICD-10-CM | POA: Diagnosis not present

## 2020-03-11 DIAGNOSIS — Z992 Dependence on renal dialysis: Secondary | ICD-10-CM | POA: Diagnosis not present

## 2020-03-14 DIAGNOSIS — E1129 Type 2 diabetes mellitus with other diabetic kidney complication: Secondary | ICD-10-CM | POA: Diagnosis not present

## 2020-03-14 DIAGNOSIS — N2581 Secondary hyperparathyroidism of renal origin: Secondary | ICD-10-CM | POA: Diagnosis not present

## 2020-03-14 DIAGNOSIS — D689 Coagulation defect, unspecified: Secondary | ICD-10-CM | POA: Diagnosis not present

## 2020-03-14 DIAGNOSIS — Z992 Dependence on renal dialysis: Secondary | ICD-10-CM | POA: Diagnosis not present

## 2020-03-14 DIAGNOSIS — N186 End stage renal disease: Secondary | ICD-10-CM | POA: Diagnosis not present

## 2020-03-16 DIAGNOSIS — E1129 Type 2 diabetes mellitus with other diabetic kidney complication: Secondary | ICD-10-CM | POA: Diagnosis not present

## 2020-03-16 DIAGNOSIS — Z992 Dependence on renal dialysis: Secondary | ICD-10-CM | POA: Diagnosis not present

## 2020-03-16 DIAGNOSIS — D689 Coagulation defect, unspecified: Secondary | ICD-10-CM | POA: Diagnosis not present

## 2020-03-16 DIAGNOSIS — N186 End stage renal disease: Secondary | ICD-10-CM | POA: Diagnosis not present

## 2020-03-16 DIAGNOSIS — N2581 Secondary hyperparathyroidism of renal origin: Secondary | ICD-10-CM | POA: Diagnosis not present

## 2020-03-18 DIAGNOSIS — N2581 Secondary hyperparathyroidism of renal origin: Secondary | ICD-10-CM | POA: Diagnosis not present

## 2020-03-18 DIAGNOSIS — E1129 Type 2 diabetes mellitus with other diabetic kidney complication: Secondary | ICD-10-CM | POA: Diagnosis not present

## 2020-03-18 DIAGNOSIS — Z992 Dependence on renal dialysis: Secondary | ICD-10-CM | POA: Diagnosis not present

## 2020-03-18 DIAGNOSIS — N186 End stage renal disease: Secondary | ICD-10-CM | POA: Diagnosis not present

## 2020-03-18 DIAGNOSIS — D689 Coagulation defect, unspecified: Secondary | ICD-10-CM | POA: Diagnosis not present

## 2020-03-21 DIAGNOSIS — N2581 Secondary hyperparathyroidism of renal origin: Secondary | ICD-10-CM | POA: Diagnosis not present

## 2020-03-21 DIAGNOSIS — E1129 Type 2 diabetes mellitus with other diabetic kidney complication: Secondary | ICD-10-CM | POA: Diagnosis not present

## 2020-03-21 DIAGNOSIS — N186 End stage renal disease: Secondary | ICD-10-CM | POA: Diagnosis not present

## 2020-03-21 DIAGNOSIS — D689 Coagulation defect, unspecified: Secondary | ICD-10-CM | POA: Diagnosis not present

## 2020-03-21 DIAGNOSIS — Z992 Dependence on renal dialysis: Secondary | ICD-10-CM | POA: Diagnosis not present

## 2020-03-23 DIAGNOSIS — Z992 Dependence on renal dialysis: Secondary | ICD-10-CM | POA: Diagnosis not present

## 2020-03-23 DIAGNOSIS — D689 Coagulation defect, unspecified: Secondary | ICD-10-CM | POA: Diagnosis not present

## 2020-03-23 DIAGNOSIS — E1129 Type 2 diabetes mellitus with other diabetic kidney complication: Secondary | ICD-10-CM | POA: Diagnosis not present

## 2020-03-23 DIAGNOSIS — N2581 Secondary hyperparathyroidism of renal origin: Secondary | ICD-10-CM | POA: Diagnosis not present

## 2020-03-23 DIAGNOSIS — N186 End stage renal disease: Secondary | ICD-10-CM | POA: Diagnosis not present

## 2020-03-25 DIAGNOSIS — D689 Coagulation defect, unspecified: Secondary | ICD-10-CM | POA: Diagnosis not present

## 2020-03-25 DIAGNOSIS — Z992 Dependence on renal dialysis: Secondary | ICD-10-CM | POA: Diagnosis not present

## 2020-03-25 DIAGNOSIS — E1129 Type 2 diabetes mellitus with other diabetic kidney complication: Secondary | ICD-10-CM | POA: Diagnosis not present

## 2020-03-25 DIAGNOSIS — N2581 Secondary hyperparathyroidism of renal origin: Secondary | ICD-10-CM | POA: Diagnosis not present

## 2020-03-25 DIAGNOSIS — N186 End stage renal disease: Secondary | ICD-10-CM | POA: Diagnosis not present

## 2020-03-27 DIAGNOSIS — H43813 Vitreous degeneration, bilateral: Secondary | ICD-10-CM | POA: Diagnosis not present

## 2020-03-27 DIAGNOSIS — Z9889 Other specified postprocedural states: Secondary | ICD-10-CM | POA: Diagnosis not present

## 2020-03-27 DIAGNOSIS — Z961 Presence of intraocular lens: Secondary | ICD-10-CM | POA: Diagnosis not present

## 2020-03-27 DIAGNOSIS — E113553 Type 2 diabetes mellitus with stable proliferative diabetic retinopathy, bilateral: Secondary | ICD-10-CM | POA: Diagnosis not present

## 2020-03-27 DIAGNOSIS — H2511 Age-related nuclear cataract, right eye: Secondary | ICD-10-CM | POA: Diagnosis not present

## 2020-03-28 DIAGNOSIS — E1129 Type 2 diabetes mellitus with other diabetic kidney complication: Secondary | ICD-10-CM | POA: Diagnosis not present

## 2020-03-28 DIAGNOSIS — Z992 Dependence on renal dialysis: Secondary | ICD-10-CM | POA: Diagnosis not present

## 2020-03-28 DIAGNOSIS — N186 End stage renal disease: Secondary | ICD-10-CM | POA: Diagnosis not present

## 2020-03-28 DIAGNOSIS — D689 Coagulation defect, unspecified: Secondary | ICD-10-CM | POA: Diagnosis not present

## 2020-03-28 DIAGNOSIS — N2581 Secondary hyperparathyroidism of renal origin: Secondary | ICD-10-CM | POA: Diagnosis not present

## 2020-03-30 DIAGNOSIS — Z992 Dependence on renal dialysis: Secondary | ICD-10-CM | POA: Diagnosis not present

## 2020-03-30 DIAGNOSIS — D689 Coagulation defect, unspecified: Secondary | ICD-10-CM | POA: Diagnosis not present

## 2020-03-30 DIAGNOSIS — N186 End stage renal disease: Secondary | ICD-10-CM | POA: Diagnosis not present

## 2020-03-30 DIAGNOSIS — N2581 Secondary hyperparathyroidism of renal origin: Secondary | ICD-10-CM | POA: Diagnosis not present

## 2020-03-30 DIAGNOSIS — E1129 Type 2 diabetes mellitus with other diabetic kidney complication: Secondary | ICD-10-CM | POA: Diagnosis not present

## 2020-03-31 DIAGNOSIS — E1129 Type 2 diabetes mellitus with other diabetic kidney complication: Secondary | ICD-10-CM | POA: Diagnosis not present

## 2020-03-31 DIAGNOSIS — Z992 Dependence on renal dialysis: Secondary | ICD-10-CM | POA: Diagnosis not present

## 2020-03-31 DIAGNOSIS — N186 End stage renal disease: Secondary | ICD-10-CM | POA: Diagnosis not present

## 2020-04-01 DIAGNOSIS — N186 End stage renal disease: Secondary | ICD-10-CM | POA: Diagnosis not present

## 2020-04-01 DIAGNOSIS — R519 Headache, unspecified: Secondary | ICD-10-CM | POA: Diagnosis not present

## 2020-04-01 DIAGNOSIS — Z992 Dependence on renal dialysis: Secondary | ICD-10-CM | POA: Diagnosis not present

## 2020-04-01 DIAGNOSIS — N2581 Secondary hyperparathyroidism of renal origin: Secondary | ICD-10-CM | POA: Diagnosis not present

## 2020-04-01 DIAGNOSIS — E876 Hypokalemia: Secondary | ICD-10-CM | POA: Diagnosis not present

## 2020-04-01 DIAGNOSIS — E1129 Type 2 diabetes mellitus with other diabetic kidney complication: Secondary | ICD-10-CM | POA: Diagnosis not present

## 2020-04-01 DIAGNOSIS — D689 Coagulation defect, unspecified: Secondary | ICD-10-CM | POA: Diagnosis not present

## 2020-04-04 DIAGNOSIS — E1129 Type 2 diabetes mellitus with other diabetic kidney complication: Secondary | ICD-10-CM | POA: Diagnosis not present

## 2020-04-04 DIAGNOSIS — E876 Hypokalemia: Secondary | ICD-10-CM | POA: Diagnosis not present

## 2020-04-04 DIAGNOSIS — Z992 Dependence on renal dialysis: Secondary | ICD-10-CM | POA: Diagnosis not present

## 2020-04-04 DIAGNOSIS — D689 Coagulation defect, unspecified: Secondary | ICD-10-CM | POA: Diagnosis not present

## 2020-04-04 DIAGNOSIS — N186 End stage renal disease: Secondary | ICD-10-CM | POA: Diagnosis not present

## 2020-04-04 DIAGNOSIS — R519 Headache, unspecified: Secondary | ICD-10-CM | POA: Diagnosis not present

## 2020-04-04 DIAGNOSIS — N2581 Secondary hyperparathyroidism of renal origin: Secondary | ICD-10-CM | POA: Diagnosis not present

## 2020-04-06 DIAGNOSIS — E876 Hypokalemia: Secondary | ICD-10-CM | POA: Diagnosis not present

## 2020-04-06 DIAGNOSIS — D689 Coagulation defect, unspecified: Secondary | ICD-10-CM | POA: Diagnosis not present

## 2020-04-06 DIAGNOSIS — N2581 Secondary hyperparathyroidism of renal origin: Secondary | ICD-10-CM | POA: Diagnosis not present

## 2020-04-06 DIAGNOSIS — R519 Headache, unspecified: Secondary | ICD-10-CM | POA: Diagnosis not present

## 2020-04-06 DIAGNOSIS — N186 End stage renal disease: Secondary | ICD-10-CM | POA: Diagnosis not present

## 2020-04-06 DIAGNOSIS — E1129 Type 2 diabetes mellitus with other diabetic kidney complication: Secondary | ICD-10-CM | POA: Diagnosis not present

## 2020-04-06 DIAGNOSIS — Z992 Dependence on renal dialysis: Secondary | ICD-10-CM | POA: Diagnosis not present

## 2020-04-08 DIAGNOSIS — R519 Headache, unspecified: Secondary | ICD-10-CM | POA: Diagnosis not present

## 2020-04-08 DIAGNOSIS — Z992 Dependence on renal dialysis: Secondary | ICD-10-CM | POA: Diagnosis not present

## 2020-04-08 DIAGNOSIS — N2581 Secondary hyperparathyroidism of renal origin: Secondary | ICD-10-CM | POA: Diagnosis not present

## 2020-04-08 DIAGNOSIS — N186 End stage renal disease: Secondary | ICD-10-CM | POA: Diagnosis not present

## 2020-04-08 DIAGNOSIS — E876 Hypokalemia: Secondary | ICD-10-CM | POA: Diagnosis not present

## 2020-04-08 DIAGNOSIS — D689 Coagulation defect, unspecified: Secondary | ICD-10-CM | POA: Diagnosis not present

## 2020-04-08 DIAGNOSIS — E1129 Type 2 diabetes mellitus with other diabetic kidney complication: Secondary | ICD-10-CM | POA: Diagnosis not present

## 2020-04-11 DIAGNOSIS — R519 Headache, unspecified: Secondary | ICD-10-CM | POA: Diagnosis not present

## 2020-04-11 DIAGNOSIS — E876 Hypokalemia: Secondary | ICD-10-CM | POA: Diagnosis not present

## 2020-04-11 DIAGNOSIS — D689 Coagulation defect, unspecified: Secondary | ICD-10-CM | POA: Diagnosis not present

## 2020-04-11 DIAGNOSIS — N2581 Secondary hyperparathyroidism of renal origin: Secondary | ICD-10-CM | POA: Diagnosis not present

## 2020-04-11 DIAGNOSIS — N186 End stage renal disease: Secondary | ICD-10-CM | POA: Diagnosis not present

## 2020-04-11 DIAGNOSIS — E1129 Type 2 diabetes mellitus with other diabetic kidney complication: Secondary | ICD-10-CM | POA: Diagnosis not present

## 2020-04-11 DIAGNOSIS — Z992 Dependence on renal dialysis: Secondary | ICD-10-CM | POA: Diagnosis not present

## 2020-04-13 DIAGNOSIS — R519 Headache, unspecified: Secondary | ICD-10-CM | POA: Diagnosis not present

## 2020-04-13 DIAGNOSIS — N186 End stage renal disease: Secondary | ICD-10-CM | POA: Diagnosis not present

## 2020-04-13 DIAGNOSIS — D689 Coagulation defect, unspecified: Secondary | ICD-10-CM | POA: Diagnosis not present

## 2020-04-13 DIAGNOSIS — Z992 Dependence on renal dialysis: Secondary | ICD-10-CM | POA: Diagnosis not present

## 2020-04-13 DIAGNOSIS — N2581 Secondary hyperparathyroidism of renal origin: Secondary | ICD-10-CM | POA: Diagnosis not present

## 2020-04-13 DIAGNOSIS — E876 Hypokalemia: Secondary | ICD-10-CM | POA: Diagnosis not present

## 2020-04-13 DIAGNOSIS — E1129 Type 2 diabetes mellitus with other diabetic kidney complication: Secondary | ICD-10-CM | POA: Diagnosis not present

## 2020-04-15 DIAGNOSIS — Z992 Dependence on renal dialysis: Secondary | ICD-10-CM | POA: Diagnosis not present

## 2020-04-15 DIAGNOSIS — D689 Coagulation defect, unspecified: Secondary | ICD-10-CM | POA: Diagnosis not present

## 2020-04-15 DIAGNOSIS — R519 Headache, unspecified: Secondary | ICD-10-CM | POA: Diagnosis not present

## 2020-04-15 DIAGNOSIS — E876 Hypokalemia: Secondary | ICD-10-CM | POA: Diagnosis not present

## 2020-04-15 DIAGNOSIS — N186 End stage renal disease: Secondary | ICD-10-CM | POA: Diagnosis not present

## 2020-04-15 DIAGNOSIS — N2581 Secondary hyperparathyroidism of renal origin: Secondary | ICD-10-CM | POA: Diagnosis not present

## 2020-04-15 DIAGNOSIS — E1129 Type 2 diabetes mellitus with other diabetic kidney complication: Secondary | ICD-10-CM | POA: Diagnosis not present

## 2020-04-18 DIAGNOSIS — Z992 Dependence on renal dialysis: Secondary | ICD-10-CM | POA: Diagnosis not present

## 2020-04-18 DIAGNOSIS — E876 Hypokalemia: Secondary | ICD-10-CM | POA: Diagnosis not present

## 2020-04-18 DIAGNOSIS — E1129 Type 2 diabetes mellitus with other diabetic kidney complication: Secondary | ICD-10-CM | POA: Diagnosis not present

## 2020-04-18 DIAGNOSIS — N2581 Secondary hyperparathyroidism of renal origin: Secondary | ICD-10-CM | POA: Diagnosis not present

## 2020-04-18 DIAGNOSIS — D689 Coagulation defect, unspecified: Secondary | ICD-10-CM | POA: Diagnosis not present

## 2020-04-18 DIAGNOSIS — R519 Headache, unspecified: Secondary | ICD-10-CM | POA: Diagnosis not present

## 2020-04-18 DIAGNOSIS — N186 End stage renal disease: Secondary | ICD-10-CM | POA: Diagnosis not present

## 2020-04-19 ENCOUNTER — Ambulatory Visit (INDEPENDENT_AMBULATORY_CARE_PROVIDER_SITE_OTHER): Payer: Medicare Other | Admitting: Podiatry

## 2020-04-19 ENCOUNTER — Encounter: Payer: Self-pay | Admitting: Podiatry

## 2020-04-19 ENCOUNTER — Other Ambulatory Visit: Payer: Self-pay

## 2020-04-19 DIAGNOSIS — M79674 Pain in right toe(s): Secondary | ICD-10-CM | POA: Diagnosis not present

## 2020-04-19 DIAGNOSIS — B351 Tinea unguium: Secondary | ICD-10-CM

## 2020-04-19 DIAGNOSIS — M79675 Pain in left toe(s): Secondary | ICD-10-CM

## 2020-04-19 DIAGNOSIS — E1159 Type 2 diabetes mellitus with other circulatory complications: Secondary | ICD-10-CM | POA: Diagnosis not present

## 2020-04-19 NOTE — Progress Notes (Signed)
This patient returns to my office for at risk foot care.  This patient requires this care by a professional since this patient will be at risk due to having  Diabetes.  This patient is unable to cut nails himself since the patient cannot reach his nails.These nails are painful walking and wearing shoes.  This patient presents for at risk foot care today.  General Appearance  Alert, conversant and in no acute stress.  Vascular  Dorsalis pedis and posterior tibial  pulses are not  palpable  bilaterally.  Capillary return is within normal limits  bilaterally. Temperature is within normal limits  bilaterally.  Neurologic  Senn-Weinstein monofilament wire test within normal limits  bilaterally. Muscle power within normal limits bilaterally.  Nails Thick disfigured discolored nails with subungual debris  from hallux to fifth toes bilaterally. No evidence of bacterial infection or drainage bilaterally.  Orthopedic  No limitations of motion  feet .  No crepitus or effusions noted.  No bony pathology or digital deformities noted.  Skin  normotropic skin with no porokeratosis noted bilaterally.  No signs of infections or ulcers noted.  Asymptomatic callus left hallux.   Onychomycosis  Pain in right toes  Pain in left toes  Consent was obtained for treatment procedures.   Mechanical debridement of nails 1-5  bilaterally performed with a nail nipper.  Filed with dremel without incident.    Return office visit     4 months                 Told patient to return for periodic foot care and evaluation due to potential at risk complications.   Gardiner Barefoot DPM

## 2020-04-20 DIAGNOSIS — N186 End stage renal disease: Secondary | ICD-10-CM | POA: Diagnosis not present

## 2020-04-20 DIAGNOSIS — N2581 Secondary hyperparathyroidism of renal origin: Secondary | ICD-10-CM | POA: Diagnosis not present

## 2020-04-20 DIAGNOSIS — R519 Headache, unspecified: Secondary | ICD-10-CM | POA: Diagnosis not present

## 2020-04-20 DIAGNOSIS — D689 Coagulation defect, unspecified: Secondary | ICD-10-CM | POA: Diagnosis not present

## 2020-04-20 DIAGNOSIS — Z992 Dependence on renal dialysis: Secondary | ICD-10-CM | POA: Diagnosis not present

## 2020-04-20 DIAGNOSIS — E1129 Type 2 diabetes mellitus with other diabetic kidney complication: Secondary | ICD-10-CM | POA: Diagnosis not present

## 2020-04-20 DIAGNOSIS — E876 Hypokalemia: Secondary | ICD-10-CM | POA: Diagnosis not present

## 2020-04-22 DIAGNOSIS — R519 Headache, unspecified: Secondary | ICD-10-CM | POA: Diagnosis not present

## 2020-04-22 DIAGNOSIS — N186 End stage renal disease: Secondary | ICD-10-CM | POA: Diagnosis not present

## 2020-04-22 DIAGNOSIS — E876 Hypokalemia: Secondary | ICD-10-CM | POA: Diagnosis not present

## 2020-04-22 DIAGNOSIS — E1129 Type 2 diabetes mellitus with other diabetic kidney complication: Secondary | ICD-10-CM | POA: Diagnosis not present

## 2020-04-22 DIAGNOSIS — D689 Coagulation defect, unspecified: Secondary | ICD-10-CM | POA: Diagnosis not present

## 2020-04-22 DIAGNOSIS — N2581 Secondary hyperparathyroidism of renal origin: Secondary | ICD-10-CM | POA: Diagnosis not present

## 2020-04-22 DIAGNOSIS — Z992 Dependence on renal dialysis: Secondary | ICD-10-CM | POA: Diagnosis not present

## 2020-04-25 DIAGNOSIS — N2581 Secondary hyperparathyroidism of renal origin: Secondary | ICD-10-CM | POA: Diagnosis not present

## 2020-04-25 DIAGNOSIS — E1129 Type 2 diabetes mellitus with other diabetic kidney complication: Secondary | ICD-10-CM | POA: Diagnosis not present

## 2020-04-25 DIAGNOSIS — D689 Coagulation defect, unspecified: Secondary | ICD-10-CM | POA: Diagnosis not present

## 2020-04-25 DIAGNOSIS — R519 Headache, unspecified: Secondary | ICD-10-CM | POA: Diagnosis not present

## 2020-04-25 DIAGNOSIS — E876 Hypokalemia: Secondary | ICD-10-CM | POA: Diagnosis not present

## 2020-04-25 DIAGNOSIS — N186 End stage renal disease: Secondary | ICD-10-CM | POA: Diagnosis not present

## 2020-04-25 DIAGNOSIS — Z992 Dependence on renal dialysis: Secondary | ICD-10-CM | POA: Diagnosis not present

## 2020-04-27 DIAGNOSIS — E876 Hypokalemia: Secondary | ICD-10-CM | POA: Diagnosis not present

## 2020-04-27 DIAGNOSIS — E1129 Type 2 diabetes mellitus with other diabetic kidney complication: Secondary | ICD-10-CM | POA: Diagnosis not present

## 2020-04-27 DIAGNOSIS — N186 End stage renal disease: Secondary | ICD-10-CM | POA: Diagnosis not present

## 2020-04-27 DIAGNOSIS — N2581 Secondary hyperparathyroidism of renal origin: Secondary | ICD-10-CM | POA: Diagnosis not present

## 2020-04-27 DIAGNOSIS — R519 Headache, unspecified: Secondary | ICD-10-CM | POA: Diagnosis not present

## 2020-04-27 DIAGNOSIS — Z992 Dependence on renal dialysis: Secondary | ICD-10-CM | POA: Diagnosis not present

## 2020-04-27 DIAGNOSIS — D689 Coagulation defect, unspecified: Secondary | ICD-10-CM | POA: Diagnosis not present

## 2020-04-29 DIAGNOSIS — R519 Headache, unspecified: Secondary | ICD-10-CM | POA: Diagnosis not present

## 2020-04-29 DIAGNOSIS — D689 Coagulation defect, unspecified: Secondary | ICD-10-CM | POA: Diagnosis not present

## 2020-04-29 DIAGNOSIS — Z992 Dependence on renal dialysis: Secondary | ICD-10-CM | POA: Diagnosis not present

## 2020-04-29 DIAGNOSIS — N2581 Secondary hyperparathyroidism of renal origin: Secondary | ICD-10-CM | POA: Diagnosis not present

## 2020-04-29 DIAGNOSIS — E876 Hypokalemia: Secondary | ICD-10-CM | POA: Diagnosis not present

## 2020-04-29 DIAGNOSIS — E1129 Type 2 diabetes mellitus with other diabetic kidney complication: Secondary | ICD-10-CM | POA: Diagnosis not present

## 2020-04-29 DIAGNOSIS — N186 End stage renal disease: Secondary | ICD-10-CM | POA: Diagnosis not present

## 2020-05-01 DIAGNOSIS — E1129 Type 2 diabetes mellitus with other diabetic kidney complication: Secondary | ICD-10-CM | POA: Diagnosis not present

## 2020-05-01 DIAGNOSIS — N186 End stage renal disease: Secondary | ICD-10-CM | POA: Diagnosis not present

## 2020-05-01 DIAGNOSIS — Z992 Dependence on renal dialysis: Secondary | ICD-10-CM | POA: Diagnosis not present

## 2020-05-02 DIAGNOSIS — N186 End stage renal disease: Secondary | ICD-10-CM | POA: Diagnosis not present

## 2020-05-02 DIAGNOSIS — D689 Coagulation defect, unspecified: Secondary | ICD-10-CM | POA: Diagnosis not present

## 2020-05-02 DIAGNOSIS — N2581 Secondary hyperparathyroidism of renal origin: Secondary | ICD-10-CM | POA: Diagnosis not present

## 2020-05-02 DIAGNOSIS — E876 Hypokalemia: Secondary | ICD-10-CM | POA: Diagnosis not present

## 2020-05-02 DIAGNOSIS — Z992 Dependence on renal dialysis: Secondary | ICD-10-CM | POA: Diagnosis not present

## 2020-05-04 DIAGNOSIS — N186 End stage renal disease: Secondary | ICD-10-CM | POA: Diagnosis not present

## 2020-05-04 DIAGNOSIS — Z992 Dependence on renal dialysis: Secondary | ICD-10-CM | POA: Diagnosis not present

## 2020-05-04 DIAGNOSIS — E876 Hypokalemia: Secondary | ICD-10-CM | POA: Diagnosis not present

## 2020-05-04 DIAGNOSIS — N2581 Secondary hyperparathyroidism of renal origin: Secondary | ICD-10-CM | POA: Diagnosis not present

## 2020-05-04 DIAGNOSIS — D689 Coagulation defect, unspecified: Secondary | ICD-10-CM | POA: Diagnosis not present

## 2020-05-06 DIAGNOSIS — N186 End stage renal disease: Secondary | ICD-10-CM | POA: Diagnosis not present

## 2020-05-06 DIAGNOSIS — N2581 Secondary hyperparathyroidism of renal origin: Secondary | ICD-10-CM | POA: Diagnosis not present

## 2020-05-06 DIAGNOSIS — D689 Coagulation defect, unspecified: Secondary | ICD-10-CM | POA: Diagnosis not present

## 2020-05-06 DIAGNOSIS — Z992 Dependence on renal dialysis: Secondary | ICD-10-CM | POA: Diagnosis not present

## 2020-05-06 DIAGNOSIS — E876 Hypokalemia: Secondary | ICD-10-CM | POA: Diagnosis not present

## 2020-05-09 DIAGNOSIS — N2581 Secondary hyperparathyroidism of renal origin: Secondary | ICD-10-CM | POA: Diagnosis not present

## 2020-05-09 DIAGNOSIS — E876 Hypokalemia: Secondary | ICD-10-CM | POA: Diagnosis not present

## 2020-05-09 DIAGNOSIS — N186 End stage renal disease: Secondary | ICD-10-CM | POA: Diagnosis not present

## 2020-05-09 DIAGNOSIS — D689 Coagulation defect, unspecified: Secondary | ICD-10-CM | POA: Diagnosis not present

## 2020-05-09 DIAGNOSIS — Z992 Dependence on renal dialysis: Secondary | ICD-10-CM | POA: Diagnosis not present

## 2020-05-11 DIAGNOSIS — E876 Hypokalemia: Secondary | ICD-10-CM | POA: Diagnosis not present

## 2020-05-11 DIAGNOSIS — Z992 Dependence on renal dialysis: Secondary | ICD-10-CM | POA: Diagnosis not present

## 2020-05-11 DIAGNOSIS — D689 Coagulation defect, unspecified: Secondary | ICD-10-CM | POA: Diagnosis not present

## 2020-05-11 DIAGNOSIS — N2581 Secondary hyperparathyroidism of renal origin: Secondary | ICD-10-CM | POA: Diagnosis not present

## 2020-05-11 DIAGNOSIS — N186 End stage renal disease: Secondary | ICD-10-CM | POA: Diagnosis not present

## 2020-05-13 DIAGNOSIS — N2581 Secondary hyperparathyroidism of renal origin: Secondary | ICD-10-CM | POA: Diagnosis not present

## 2020-05-13 DIAGNOSIS — D689 Coagulation defect, unspecified: Secondary | ICD-10-CM | POA: Diagnosis not present

## 2020-05-13 DIAGNOSIS — N186 End stage renal disease: Secondary | ICD-10-CM | POA: Diagnosis not present

## 2020-05-13 DIAGNOSIS — Z992 Dependence on renal dialysis: Secondary | ICD-10-CM | POA: Diagnosis not present

## 2020-05-13 DIAGNOSIS — E876 Hypokalemia: Secondary | ICD-10-CM | POA: Diagnosis not present

## 2020-05-16 ENCOUNTER — Ambulatory Visit: Payer: Medicare Other

## 2020-05-16 DIAGNOSIS — E876 Hypokalemia: Secondary | ICD-10-CM | POA: Diagnosis not present

## 2020-05-16 DIAGNOSIS — D689 Coagulation defect, unspecified: Secondary | ICD-10-CM | POA: Diagnosis not present

## 2020-05-16 DIAGNOSIS — Z992 Dependence on renal dialysis: Secondary | ICD-10-CM | POA: Diagnosis not present

## 2020-05-16 DIAGNOSIS — N186 End stage renal disease: Secondary | ICD-10-CM | POA: Diagnosis not present

## 2020-05-16 DIAGNOSIS — N2581 Secondary hyperparathyroidism of renal origin: Secondary | ICD-10-CM | POA: Diagnosis not present

## 2020-05-18 ENCOUNTER — Ambulatory Visit: Payer: Medicare Other

## 2020-05-18 ENCOUNTER — Ambulatory Visit: Payer: Medicare Other | Admitting: Nurse Practitioner

## 2020-05-18 DIAGNOSIS — D689 Coagulation defect, unspecified: Secondary | ICD-10-CM | POA: Diagnosis not present

## 2020-05-18 DIAGNOSIS — E876 Hypokalemia: Secondary | ICD-10-CM | POA: Diagnosis not present

## 2020-05-18 DIAGNOSIS — N186 End stage renal disease: Secondary | ICD-10-CM | POA: Diagnosis not present

## 2020-05-18 DIAGNOSIS — N2581 Secondary hyperparathyroidism of renal origin: Secondary | ICD-10-CM | POA: Diagnosis not present

## 2020-05-18 DIAGNOSIS — Z992 Dependence on renal dialysis: Secondary | ICD-10-CM | POA: Diagnosis not present

## 2020-05-20 DIAGNOSIS — D689 Coagulation defect, unspecified: Secondary | ICD-10-CM | POA: Diagnosis not present

## 2020-05-20 DIAGNOSIS — N186 End stage renal disease: Secondary | ICD-10-CM | POA: Diagnosis not present

## 2020-05-20 DIAGNOSIS — N2581 Secondary hyperparathyroidism of renal origin: Secondary | ICD-10-CM | POA: Diagnosis not present

## 2020-05-20 DIAGNOSIS — E876 Hypokalemia: Secondary | ICD-10-CM | POA: Diagnosis not present

## 2020-05-20 DIAGNOSIS — Z992 Dependence on renal dialysis: Secondary | ICD-10-CM | POA: Diagnosis not present

## 2020-05-23 DIAGNOSIS — N2581 Secondary hyperparathyroidism of renal origin: Secondary | ICD-10-CM | POA: Diagnosis not present

## 2020-05-23 DIAGNOSIS — E876 Hypokalemia: Secondary | ICD-10-CM | POA: Diagnosis not present

## 2020-05-23 DIAGNOSIS — N186 End stage renal disease: Secondary | ICD-10-CM | POA: Diagnosis not present

## 2020-05-23 DIAGNOSIS — Z992 Dependence on renal dialysis: Secondary | ICD-10-CM | POA: Diagnosis not present

## 2020-05-23 DIAGNOSIS — D689 Coagulation defect, unspecified: Secondary | ICD-10-CM | POA: Diagnosis not present

## 2020-05-25 DIAGNOSIS — N2581 Secondary hyperparathyroidism of renal origin: Secondary | ICD-10-CM | POA: Diagnosis not present

## 2020-05-25 DIAGNOSIS — Z992 Dependence on renal dialysis: Secondary | ICD-10-CM | POA: Diagnosis not present

## 2020-05-25 DIAGNOSIS — D689 Coagulation defect, unspecified: Secondary | ICD-10-CM | POA: Diagnosis not present

## 2020-05-25 DIAGNOSIS — N186 End stage renal disease: Secondary | ICD-10-CM | POA: Diagnosis not present

## 2020-05-25 DIAGNOSIS — E876 Hypokalemia: Secondary | ICD-10-CM | POA: Diagnosis not present

## 2020-05-27 DIAGNOSIS — Z992 Dependence on renal dialysis: Secondary | ICD-10-CM | POA: Diagnosis not present

## 2020-05-27 DIAGNOSIS — D689 Coagulation defect, unspecified: Secondary | ICD-10-CM | POA: Diagnosis not present

## 2020-05-27 DIAGNOSIS — N186 End stage renal disease: Secondary | ICD-10-CM | POA: Diagnosis not present

## 2020-05-27 DIAGNOSIS — E876 Hypokalemia: Secondary | ICD-10-CM | POA: Diagnosis not present

## 2020-05-27 DIAGNOSIS — N2581 Secondary hyperparathyroidism of renal origin: Secondary | ICD-10-CM | POA: Diagnosis not present

## 2020-05-30 DIAGNOSIS — D689 Coagulation defect, unspecified: Secondary | ICD-10-CM | POA: Diagnosis not present

## 2020-05-30 DIAGNOSIS — N186 End stage renal disease: Secondary | ICD-10-CM | POA: Diagnosis not present

## 2020-05-30 DIAGNOSIS — E876 Hypokalemia: Secondary | ICD-10-CM | POA: Diagnosis not present

## 2020-05-30 DIAGNOSIS — Z992 Dependence on renal dialysis: Secondary | ICD-10-CM | POA: Diagnosis not present

## 2020-05-30 DIAGNOSIS — N2581 Secondary hyperparathyroidism of renal origin: Secondary | ICD-10-CM | POA: Diagnosis not present

## 2020-05-31 DIAGNOSIS — N186 End stage renal disease: Secondary | ICD-10-CM | POA: Diagnosis not present

## 2020-05-31 DIAGNOSIS — E1129 Type 2 diabetes mellitus with other diabetic kidney complication: Secondary | ICD-10-CM | POA: Diagnosis not present

## 2020-05-31 DIAGNOSIS — Z992 Dependence on renal dialysis: Secondary | ICD-10-CM | POA: Diagnosis not present

## 2020-06-01 DIAGNOSIS — E1129 Type 2 diabetes mellitus with other diabetic kidney complication: Secondary | ICD-10-CM | POA: Diagnosis not present

## 2020-06-01 DIAGNOSIS — E876 Hypokalemia: Secondary | ICD-10-CM | POA: Diagnosis not present

## 2020-06-01 DIAGNOSIS — Z992 Dependence on renal dialysis: Secondary | ICD-10-CM | POA: Diagnosis not present

## 2020-06-01 DIAGNOSIS — N186 End stage renal disease: Secondary | ICD-10-CM | POA: Diagnosis not present

## 2020-06-01 DIAGNOSIS — D689 Coagulation defect, unspecified: Secondary | ICD-10-CM | POA: Diagnosis not present

## 2020-06-01 DIAGNOSIS — N2581 Secondary hyperparathyroidism of renal origin: Secondary | ICD-10-CM | POA: Diagnosis not present

## 2020-06-03 DIAGNOSIS — D689 Coagulation defect, unspecified: Secondary | ICD-10-CM | POA: Diagnosis not present

## 2020-06-03 DIAGNOSIS — E1129 Type 2 diabetes mellitus with other diabetic kidney complication: Secondary | ICD-10-CM | POA: Diagnosis not present

## 2020-06-03 DIAGNOSIS — Z992 Dependence on renal dialysis: Secondary | ICD-10-CM | POA: Diagnosis not present

## 2020-06-03 DIAGNOSIS — E876 Hypokalemia: Secondary | ICD-10-CM | POA: Diagnosis not present

## 2020-06-03 DIAGNOSIS — N186 End stage renal disease: Secondary | ICD-10-CM | POA: Diagnosis not present

## 2020-06-03 DIAGNOSIS — N2581 Secondary hyperparathyroidism of renal origin: Secondary | ICD-10-CM | POA: Diagnosis not present

## 2020-06-06 DIAGNOSIS — D689 Coagulation defect, unspecified: Secondary | ICD-10-CM | POA: Diagnosis not present

## 2020-06-06 DIAGNOSIS — Z992 Dependence on renal dialysis: Secondary | ICD-10-CM | POA: Diagnosis not present

## 2020-06-06 DIAGNOSIS — N186 End stage renal disease: Secondary | ICD-10-CM | POA: Diagnosis not present

## 2020-06-06 DIAGNOSIS — N2581 Secondary hyperparathyroidism of renal origin: Secondary | ICD-10-CM | POA: Diagnosis not present

## 2020-06-06 DIAGNOSIS — E876 Hypokalemia: Secondary | ICD-10-CM | POA: Diagnosis not present

## 2020-06-06 DIAGNOSIS — E1129 Type 2 diabetes mellitus with other diabetic kidney complication: Secondary | ICD-10-CM | POA: Diagnosis not present

## 2020-06-07 ENCOUNTER — Ambulatory Visit (INDEPENDENT_AMBULATORY_CARE_PROVIDER_SITE_OTHER): Payer: Medicare Other

## 2020-06-07 ENCOUNTER — Other Ambulatory Visit: Payer: Self-pay

## 2020-06-07 ENCOUNTER — Encounter: Payer: Self-pay | Admitting: Nurse Practitioner

## 2020-06-07 ENCOUNTER — Ambulatory Visit (INDEPENDENT_AMBULATORY_CARE_PROVIDER_SITE_OTHER): Payer: Medicare Other | Admitting: Nurse Practitioner

## 2020-06-07 VITALS — BP 140/84 | HR 78 | Temp 98.5°F | Ht 64.0 in | Wt 262.0 lb

## 2020-06-07 VITALS — BP 140/84 | HR 78 | Temp 98.5°F | Ht 64.0 in | Wt 262.4 lb

## 2020-06-07 DIAGNOSIS — Z0000 Encounter for general adult medical examination without abnormal findings: Secondary | ICD-10-CM | POA: Diagnosis not present

## 2020-06-07 DIAGNOSIS — I5030 Unspecified diastolic (congestive) heart failure: Secondary | ICD-10-CM | POA: Diagnosis not present

## 2020-06-07 DIAGNOSIS — E662 Morbid (severe) obesity with alveolar hypoventilation: Secondary | ICD-10-CM | POA: Diagnosis not present

## 2020-06-07 DIAGNOSIS — Z992 Dependence on renal dialysis: Secondary | ICD-10-CM | POA: Diagnosis not present

## 2020-06-07 DIAGNOSIS — R7309 Other abnormal glucose: Secondary | ICD-10-CM | POA: Diagnosis not present

## 2020-06-07 DIAGNOSIS — N186 End stage renal disease: Secondary | ICD-10-CM

## 2020-06-07 DIAGNOSIS — I10 Essential (primary) hypertension: Secondary | ICD-10-CM | POA: Diagnosis not present

## 2020-06-07 NOTE — Progress Notes (Signed)
This visit occurred during the SARS-CoV-2 public health emergency.  Safety protocols were in place, including screening questions prior to the visit, additional usage of staff PPE, and extensive cleaning of exam room while observing appropriate contact time as indicated for disinfecting solutions.  Subjective:     Patient ID: TIM WILHIDE , female    DOB: 01-01-1946 , 74 y.o.   MRN: [id number]   Chief Complaint  Patient presents with  . Hypertension  . Hyperlipidemia    HPI  Here for hyperlipidemia follow up.  She has not been seen since October 2020. Continues to go to dialysis - T, Th and Sat  She has been taking mididrin for her low blood pressure during dialysis.  Yesterday her Hgb was high and they did not return her blood, blood pressure dropped but it came back up per patient.  Hypertension This is a chronic problem. The current episode started more than 1 year ago. The problem is unchanged. The problem is controlled. Pertinent negatives include no anxiety. There are no associated agents to hypertension. Risk factors for coronary artery disease include obesity. Past treatments include diuretics. Compliance problems include exercise.  There is no history of angina. There is no history of chronic renal disease.     Past Medical History:  Diagnosis Date  . Arthritis    "left knee" (12/04/2016)  . CAD (coronary artery disease)    a. 12/2016 NSTEMI/PCI: LM nl, LaD 20p/d, D1 20, OM2 20, OM3 20, RCA 63m (3.0x18 Resolute DES). EF 65%.  . Chronic lower back pain   . Diverticulosis    on CT  . ESRD on dialysis [facility])    "[facility]; [place]; TTS" (12/04/2016)  . GERD (gastroesophageal reflux disease)   . Hiatal hernia   . Hyperlipidemia   . Hypertension    a. 12/2016 labetalol d/c'd 2/2 hypotension in HD.  . Morbid obesity ([place])   . Obesity   . Pneumonia    "couple times" (12/04/2016)  . Renal insufficiency   . Type II diabetes mellitus (HCC)      Family History   Problem Relation Age of Onset  . Diabetes Mother   . Peripheral vascular disease Mother        amputation  . CAD Father   . Heart disease Father   . Hypertension Father   . Heart attack Father   . CAD Sister   . Cancer Brother        x 2 brothers, type unknown  . Breast cancer Sister      Current Outpatient Medications:  .  aspirin EC 81 MG EC tablet, Take 1 tablet (81 mg total) by mouth daily., Disp: , Rfl:  .  AURYXIA 1 GM 210 MG(Fe) tablet, Take 420 mg by mouth 2 (two) times a day., Disp: , Rfl:  .  B Complex-C-Folic Acid (DIALYVITE 237) 0.8 MG TABS, Take 1 tablet by mouth daily., Disp: , Rfl:  .  diclofenac sodium (VOLTAREN) 1 % GEL, Apply 2 g topically 4 (four) times daily., Disp: 100 g, Rfl: 2 .  ezetimibe (ZETIA) 10 MG tablet, Take 1 tablet (10 mg total) by mouth daily., Disp: 90 tablet, Rfl: 3 .  metoCLOPramide (REGLAN) 10 MG tablet, TAKE 1 TABLET BY ORAL ROUTE 2 TIMES EVERY DAY 30 MINUTES BEFORE MEALS AND AT BEDTIME, Disp: 60 tablet, Rfl: 1 .  midodrine (PROAMATINE) 10 MG tablet, Take 10 mg by mouth 3 (three) times a week. Dialysis days Tues, Thurs Saturday, Disp: ,  Rfl:  .  Omega-3 Fatty Acids (FISH OIL) 1000 MG CPDR, Take 1,000 mg by mouth daily. , Disp: , Rfl:  .  omeprazole (PRILOSEC) 20 MG capsule, TAKE 1 CAPSULE BY MOUTH EVERY DAY BEFORE MEALS, Disp: 90 capsule, Rfl: 1 .  rosuvastatin (CRESTOR) 40 MG tablet, Take 1 tablet (40 mg total) by mouth daily., Disp: 90 tablet, Rfl: 3 .  Vitamin D, Ergocalciferol, 50 MCG (2000 UT) CAPS, Take 2,000 Units by mouth daily., Disp: , Rfl:  .  benzoyl peroxide (BENZOYL PEROXIDE) 5 % external liquid, Apply topically 2 (two) times daily. (Patient not taking: Reported on 06/07/2020), Disp: 142 g, Rfl: 12   Allergies  Allergen Reactions  . Other Other (See Comments)  . Sulfa Antibiotics Other (See Comments)  . Sulfa Drugs Cross Reactors Other (See Comments)    Doesn't remember      Review of Systems   Today's Vitals   06/07/20 0945   BP: 140/84  Pulse: 78  Temp: 98.5 F (36.9 C)  TempSrc: Oral  Weight: 262 lb 6.4 oz (119 kg)  Height: 5\' 4"  (1.626 m)  PainSc: 0-No pain   Body mass index is 45.04 kg/m.   Objective:  Physical Exam Vitals reviewed.  Constitutional:      General: She is not in acute distress.    Appearance: Normal appearance.  Cardiovascular:     Rate and Rhythm: Normal rate and regular rhythm.     Pulses: Normal pulses.     Heart sounds: Normal heart sounds. No murmur heard.      Arteriovenous access: left arteriovenous access is present. Pulmonary:     Effort: Pulmonary effort is normal. No respiratory distress.     Breath sounds: Normal breath sounds.  Neurological:     General: No focal deficit present.     Mental Status: She is alert and oriented to person, place, and time.     Cranial Nerves: No cranial nerve deficit.  Psychiatric:        Mood and Affect: Mood normal.        Behavior: Behavior normal.        Thought Content: Thought content normal.        Judgment: Judgment normal.         Assessment And Plan:     1. Essential hypertension  Chronic, fair control  Improves when she has dialysis  - Hemoglobin A1c - Lipid panel - CMP14 + Anion Gap  2. Diastolic congestive heart failure, unspecified HF chronicity (HCC)  Chronic, stable  Continue limiting your water intake.  3. 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[place])  Encouraged to exercise as tolerated  4. ESRD (end stage renal disease) on dialysis ([place])  Continue with dialysis   [name], FNP    THE PATIENT IS ENCOURAGED TO PRACTICE SOCIAL DISTANCING DUE TO THE COVID-19 PANDEMIC.

## 2020-06-07 NOTE — Progress Notes (Signed)
This visit occurred during the SARS-CoV-2 public health emergency.  Safety protocols were in place, including screening questions prior to the visit, additional usage of staff PPE, and extensive cleaning of exam room while observing appropriate contact time as indicated for disinfecting solutions.  Subjective:   Jacqueline [name] is a 74 y.o. female who presents for Medicare Annual (Subsequent) preventive examination.  Review of Systems     Cardiac Risk Factors include: advanced age (>25men, >43 women);hypertension;obesity (BMI >30kg/m2);sedentary lifestyle     Objective:    Today's Vitals   06/07/20 1053  BP: 140/84  Pulse: 78  Temp: 98.5 F (36.9 C)  TempSrc: Oral  Weight: 262 lb (118.8 kg)  Height: 5\' 4"  (1.626 m)   Body mass index is 44.97 kg/m.  Advanced Directives 06/07/2020 10/05/2019 06/22/2019 06/22/2019 05/12/2019 07/23/2017 07/16/2017  Does Patient Have a Medical Advance Directive? No No - No No No No  Would patient like information on creating a medical advance directive? No - Patient declined No - Patient declined No - Patient declined No - Patient declined - No - Patient declined -  Pre-existing out of facility DNR order (yellow form or pink MOST form) - - - - - - -    Current Medications (verified) Outpatient Encounter Medications as of 06/07/2020  Medication Sig  . aspirin EC 81 MG EC tablet Take 1 tablet (81 mg total) by mouth daily.  Lorin Picket 1 GM 210 MG(Fe) tablet Take 420 mg by mouth 2 (two) times a day.  . B Complex-C-Folic Acid (DIALYVITE 149) 0.8 MG TABS Take 1 tablet by mouth daily.  . benzoyl peroxide (BENZOYL PEROXIDE) 5 % external liquid Apply topically 2 (two) times daily. (Patient not taking: Reported on 06/07/2020)  . diclofenac sodium (VOLTAREN) 1 % GEL Apply 2 g topically 4 (four) times daily.  Marland Kitchen ezetimibe (ZETIA) 10 MG tablet Take 1 tablet (10 mg total) by mouth daily.  . metoCLOPramide (REGLAN) 10 MG tablet TAKE 1 TABLET BY ORAL ROUTE 2 TIMES EVERY DAY  30 MINUTES BEFORE MEALS AND AT BEDTIME  . midodrine (PROAMATINE) 10 MG tablet Take 10 mg by mouth 3 (three) times a week. Dialysis days [place] Saturday  . Omega-3 Fatty Acids (FISH OIL) 1000 MG CPDR Take 1,000 mg by mouth daily.   Marland Kitchen omeprazole (PRILOSEC) 20 MG capsule TAKE 1 CAPSULE BY MOUTH EVERY DAY BEFORE MEALS  . rosuvastatin (CRESTOR) 40 MG tablet Take 1 tablet (40 mg total) by mouth daily.  . Vitamin D, Ergocalciferol, 50 MCG (2000 UT) CAPS Take 2,000 Units by mouth daily.   No facility-administered encounter medications on file as of 06/07/2020.    Allergies (verified) Other, Sulfa antibiotics, and Sulfa drugs cross reactors   History: Past Medical History:  Diagnosis Date  . Arthritis    "left knee" (12/04/2016)  . CAD (coronary artery disease)    a. 12/2016 NSTEMI/PCI: LM nl, LaD 20p/d, D1 20, OM2 20, OM3 20, RCA 4m (3.0x18 Resolute DES). EF 65%.  . Chronic lower back pain   . Diverticulosis    on CT  . ESRD on dialysis [facility])    "[facility]; [place]; TTS" (12/04/2016)  . GERD (gastroesophageal reflux disease)   . Hiatal hernia   . Hyperlipidemia   . Hypertension    a. 12/2016 labetalol d/c'd 2/2 hypotension in HD.  . Morbid obesity ([place])   . Obesity   . Pneumonia    "couple times" (12/04/2016)  . Renal insufficiency   . Type  II diabetes mellitus ([place])    Past Surgical History:  Procedure Laterality Date  . ABDOMINAL HYSTERECTOMY    . AV FISTULA PLACEMENT Left 01/27/2014   Procedure: ARTERIOVENOUS (AV) FISTULA CREATION;  Surgeon: Mal Misty, MD;  Location: [place];  Service: Vascular;  Laterality: Left;  . AV FISTULA REPAIR     "had it cleaned out"  . CARDIAC CATHETERIZATION  ?1980s  . CARDIAC CATHETERIZATION N/A 12/04/2016   Procedure: Left Heart Cath Angiography;  Surgeon: Burnell Blanks, MD;  Location: [place] CV LAB;  Service: Cardiovascular;  Laterality: N/A;  . CARDIAC CATHETERIZATION N/A 12/04/2016   Procedure: Coronary Stent  Intervention;  Surgeon: Burnell Blanks, MD;  Location: [place] [facility];  Service: Cardiovascular;  Laterality: N/A;  Mid RCA  . CATARACT EXTRACTION W/ INTRAOCULAR LENS IMPLANT Left   . COLONOSCOPY W/ BIOPSIES AND POLYPECTOMY    . CORONARY ANGIOPLASTY WITH STENT PLACEMENT  12/04/2016  . ESOPHAGOGASTRODUODENOSCOPY    . INSERTION OF DIALYSIS CATHETER N/A 01/19/2014   Procedure: INSERTION OF DIALYSIS CATHETER;  Surgeon: Rosetta Posner, MD;  Location: [facility] OR;  Service: Vascular;  Laterality: N/A;   Family History  Problem Relation Age of Onset  . Diabetes Mother   . Peripheral vascular disease Mother        amputation  . CAD Father   . Heart disease Father   . Hypertension Father   . Heart attack Father   . CAD Sister   . Cancer Brother        x 2 brothers, type unknown  . Breast cancer Sister    Social History   Socioeconomic History  . Marital status: Married    Spouse name: Not on file  . Number of children: 4  . Years of education: Not on file  . Highest education level: Not on file  Occupational History  . Occupation: retired  Tobacco Use  . Smoking status: Former Smoker    Packs/day: 1.00    Years: 20.00    Pack years: 20.00    Types: Cigarettes    Quit date: 1985    Years since quitting: 36.5  . Smokeless tobacco: Never Used  Vaping Use  . Vaping Use: Never used  Substance and Sexual Activity  . Alcohol use: No    Alcohol/week: 0.0 standard drinks  . Drug use: No  . Sexual activity: Not Currently  Other Topics Concern  . Not on file  Social History Narrative  . Not on file   Social Determinants of Health   Financial Resource Strain: Low Risk   . Difficulty of Paying Living Expenses: Not hard at all  Food Insecurity: No Food Insecurity  . Worried About Charity fundraiser in the Last Year: Never true  . Ran Out of Food in the Last Year: Never true  Transportation Needs: No Transportation Needs  . Lack of Transportation (Medical): No  . Lack of  Transportation (Non-Medical): No  Physical Activity: Insufficiently Active  . Days of Exercise per Week: 2 days  . Minutes of Exercise per Session: 10 min  Stress: No Stress Concern Present  . Feeling of Stress : Not at all  Social Connections:   . Frequency of Communication with Friends and Family:   . Frequency of Social Gatherings with Friends and Family:   . Attends Religious Services:   . Active Member of Clubs or Organizations:   . Attends Archivist Meetings:   Marland Kitchen Marital Status:  Tobacco Counseling Counseling given: Not Answered   Clinical Intake:  Pre-visit preparation completed: Yes  Pain : No/denies pain     Nutritional Status: BMI > 30  Obese Nutritional Risks: None Diabetes: Yes  How often do you need to have someone help you when you read instructions, pamphlets, or other written materials from your doctor or pharmacy?: 1 - Never What is the last grade level you completed in school?: 12th grade  Diabetic? Yes Nutrition Risk Assessment:  Has the patient had any N/V/D within the last 2 months?  No  Does the patient have any non-healing wounds?  No  Has the patient had any unintentional weight loss or weight gain?  Yes   Diabetes:  Is the patient diabetic?  Yes  If diabetic, was a CBG obtained today?  No  Did the patient bring in their glucometer from home?  No  How often do you monitor your CBG's? Does not check.   Financial Strains and Diabetes Management:  Are you having any financial strains with the device, your supplies or your medication? No .  Does the patient want to be seen by Chronic Care Management for management of their diabetes?  No  Would the patient like to be referred to a Nutritionist or for Diabetic Management?  No   Diabetic Exams:  Diabetic Eye Exam: Completed 2021 Diabetic Foot Exam: Completed today   Interpreter Needed?: No  Information entered by :: NAllen LPN   Activities of Daily Living In your present  state of health, do you have any difficulty performing the following activities: 06/07/2020 06/22/2019  Hearing? N N  Vision? N N  Difficulty concentrating or making decisions? N N  Walking or climbing stairs? Y Y  Dressing or bathing? N N  Doing errands, shopping? N Y  Conservation officer, nature and eating ? N -  Using the Toilet? N -  In the past six months, have you accidently leaked urine? N -  Do you have problems with loss of bowel control? N -  Managing your Medications? N -  Managing your Finances? N -  Housekeeping or managing your Housekeeping? N -  Some recent data might be hidden    Patient Care Team: [name], FNP as PCP - General ([place]) Sueanne Margarita, MD as PCP - Cardiology (Cardiology)  Indicate any recent Medical Services you may have received from other than [facility] providers in the past year (date may be approximate).     Assessment:   This is a routine wellness examination for [facility].  Hearing/Vision screen  Hearing Screening   125Hz  250Hz  500Hz  1000Hz  2000Hz  3000Hz  4000Hz  6000Hz  8000Hz   Right ear:           Left ear:           Vision Screening Comments: Regular eye exams, Dr. Katy Fitch  Dietary issues and exercise activities discussed: Current Exercise Habits: Home exercise routine, Type of exercise: walking, Time (Minutes): 10, Frequency (Times/Week): 2, Weekly Exercise (Minutes/Week): 20  Goals    . Patient Stated     No goals    . Patient Stated     06/07/2020, no goals      Depression Screen PHQ 2/9 Scores 06/07/2020 06/07/2020 09/13/2019 07/05/2019 05/12/2019 03/22/2019 11/20/2018  PHQ - 2 Score 0 0 0 0 0 0 0  PHQ- 9 Score 0 - - - 0 - -    Fall Risk Fall Risk  06/07/2020 06/07/2020 09/13/2019 07/05/2019 05/12/2019  Falls in the past year?  0 0 0 0 0  Risk for fall due to : Medication side effect - - - Medication side effect  Follow up Falls evaluation completed;Education provided;Falls prevention discussed - - - Falls prevention discussed    Any stairs in or  around the home? No  If so, are there any without handrails? n/a Home free of loose throw rugs in walkways, pet beds, electrical cords, etc? Yes  Adequate lighting in your home to reduce risk of falls? Yes   ASSISTIVE DEVICES UTILIZED TO PREVENT FALLS:  Life alert? No  Use of a cane, walker or w/c? No  Grab bars in the bathroom? Yes  Shower chair or bench in shower? Yes  Elevated toilet seat or a handicapped toilet? Yes   TIMED UP AND GO:  Was the test performed? No . .    Cognitive Function:     6CIT Screen 06/07/2020 05/12/2019  What Year? 0 points 0 points  What month? 0 points 0 points  What time? 3 points 0 points  Count back from 20 0 points 0 points  Months in reverse 0 points 2 points  Repeat phrase 2 points 0 points  Total Score 5 2    Immunizations Immunization History  Administered Date(s) Administered  . Influenza-Unspecified 09/30/2018, 08/30/2019  . Moderna SARS-COVID-2 Vaccination 01/13/2020, 02/10/2020  . Pneumococcal Polysaccharide-23 09/13/2019    TDAP status: Up to date Flu Vaccine status: Up to date Pneumococcal vaccine status: Up to date Covid-19 vaccine status: Completed vaccines  Qualifies for Shingles Vaccine? Yes   Zostavax completed No   Shingrix Completed?: No.    Education has been provided regarding the importance of this vaccine. Patient has been advised to call insurance company to determine out of pocket expense if they have not yet received this vaccine. Advised may also receive vaccine at local pharmacy or Health Dept. Verbalized acceptance and understanding.  Screening Tests Health Maintenance  Topic Date Due  . HEMOGLOBIN A1C  01/07/2020  . OPHTHALMOLOGY EXAM  02/10/2020  . FOOT EXAM  06/10/2020  . INFLUENZA VACCINE  07/02/2020  . URINE MICROALBUMIN  09/12/2020  . TETANUS/TDAP  06/21/2023  . DEXA SCAN  Completed  . COVID-19 Vaccine  Completed  . Hepatitis C Screening  Completed  . PNA vac Low Risk Adult  Completed     Health Maintenance  Health Maintenance Due  Topic Date Due  . HEMOGLOBIN A1C  01/07/2020  . OPHTHALMOLOGY EXAM  02/10/2020    Colorectal cancer screening: No longer required.  Mammogram status: Completed 11/22/2019. Repeat every year Bone Density status: Completed 07/30/2017.   Lung Cancer Screening: (Low Dose CT Chest recommended if Age 73-80 years, 30 pack-year currently smoking OR have quit w/in 15years.) does not qualify.   Lung Cancer Screening Referral: no  Additional Screening:  Hepatitis C Screening: does qualify; Completed 03/21/2014  Vision Screening: Recommended annual ophthalmology exams for early detection of glaucoma and other disorders of the eye. Is the patient up to date with their annual eye exam?  Yes  Who is the provider or what is the name of the office in which the patient attends annual eye exams? Dr. Katy Fitch If pt is not established with a provider, would they like to be referred to a provider to establish care? No .   Dental Screening: Recommended annual dental exams for proper oral hygiene  Community Resource Referral / Chronic Care Management: CRR required this visit?  No   CCM required this visit?  No  Plan:     I have personally reviewed and noted the following in the patient's chart:   . Medical and social history . Use of alcohol, tobacco or illicit drugs  . Current medications and supplements . Functional ability and status . Nutritional status . Physical activity . Advanced directives . List of other physicians . Hospitalizations, surgeries, and ER visits in previous 12 months . Vitals . Screenings to include cognitive, depression, and falls . Referrals and appointments  In addition, I have reviewed and discussed with patient certain preventive protocols, quality metrics, and best practice recommendations. A written personalized care plan for preventive services as well as general preventive health recommendations were  provided to patient.     Kellie Simmering, LPN   0/01/2178   Nurse Notes:

## 2020-06-07 NOTE — Patient Instructions (Signed)
Jacqueline [name] , Thank you for taking time to come for your Medicare Wellness Visit. I appreciate your ongoing commitment to your health goals. Please review the following plan we discussed and let me know if I can assist you in the future.   Screening recommendations/referrals: Colonoscopy: not required Mammogram: completed 11/22/2019 Bone Density: completed 07/30/2017 Recommended yearly ophthalmology/optometry visit for glaucoma screening and checkup Recommended yearly dental visit for hygiene and checkup  Vaccinations: Influenza vaccine: completed 08/30/2019, due 07/02/2020 Pneumococcal vaccine: completed 09/13/2019 Tdap vaccine: completed 06/20/2013, due 06/21/2023 Shingles vaccine: discussed   Covid-19:02/10/2020, 01/13/2020  Advanced directives: Advance directive discussed with you today. Even though you declined this today please call our office should you change your mind and we can give you the proper paperwork for you to fill out.  Conditions/risks identified: none  Next appointment:09/20/2020 at 8:45  Follow up in one year for your annual wellness visit    Preventive Care 65 Years and Older, Female Preventive care refers to lifestyle choices and visits with your health care provider that can promote health and wellness. What does preventive care include?  A yearly physical exam. This is also called an annual well check.  Dental exams once or twice a year.  Routine eye exams. Ask your health care provider how often you should have your eyes checked.  Personal lifestyle choices, including:  Daily care of your teeth and gums.  Regular physical activity.  Eating a healthy diet.  Avoiding tobacco and drug use.  Limiting alcohol use.  Practicing safe sex.  Taking low-dose aspirin every day.  Taking vitamin and mineral supplements as recommended by your health care provider. What happens during an annual well check? The services and screenings done by your health  care provider during your annual well check will depend on your age, overall health, lifestyle risk factors, and family history of disease. Counseling  Your health care provider may ask you questions about your:  Alcohol use.  Tobacco use.  Drug use.  Emotional well-being.  Home and relationship well-being.  Sexual activity.  Eating habits.  History of falls.  Memory and ability to understand (cognition).  Work and work Statistician.  Reproductive health. Screening  You may have the following tests or measurements:  Height, weight, and BMI.  Blood pressure.  Lipid and cholesterol levels. These may be checked every 5 years, or more frequently if you are over 42 years old.  Skin check.  Lung cancer screening. You may have this screening every year starting at age 39 if you have a 30-pack-year history of smoking and currently smoke or have quit within the past 15 years.  Fecal occult blood test (FOBT) of the stool. You may have this test every year starting at age 74.  Flexible sigmoidoscopy or colonoscopy. You may have a sigmoidoscopy every 5 years or a colonoscopy every 10 years starting at age 42.  Hepatitis C blood test.  Hepatitis B blood test.  Sexually transmitted disease (STD) testing.  Diabetes screening. This is done by checking your blood sugar (glucose) after you have not eaten for a while (fasting). You may have this done every 1-3 years.  Bone density scan. This is done to screen for osteoporosis. You may have this done starting at age 28.  Mammogram. This may be done every 1-2 years. Talk to your health care provider about how often you should have regular mammograms. Talk with your health care provider about your test results, treatment options, and if necessary, the need  for more tests. Vaccines  Your health care provider may recommend certain vaccines, such as:  Influenza vaccine. This is recommended every year.  Tetanus, diphtheria, and  acellular pertussis (Tdap, Td) vaccine. You may need a Td booster every 10 years.  Zoster vaccine. You may need this after age 81.  Pneumococcal 13-valent conjugate (PCV13) vaccine. One dose is recommended after age 79.  Pneumococcal polysaccharide (PPSV23) vaccine. One dose is recommended after age 41. Talk to your health care provider about which screenings and vaccines you need and how often you need them. This information is not intended to replace advice given to you by your health care provider. Make sure you discuss any questions you have with your health care provider. Document Released: 12/15/2015 Document Revised: 08/07/2016 Document Reviewed: 09/19/2015 Elsevier Interactive Patient Education  2017 [place] Prevention in the Home Falls can cause injuries. They can happen to people of all ages. There are many things you can do to make your home safe and to help prevent falls. What can I do on the outside of my home?  Regularly fix the edges of walkways and driveways and fix any cracks.  Remove anything that might make you trip as you walk through a door, such as a raised step or threshold.  Trim any bushes or trees on the path to your home.  Use bright outdoor lighting.  Clear any walking paths of anything that might make someone trip, such as rocks or tools.  Regularly check to see if handrails are loose or broken. Make sure that both sides of any steps have handrails.  Any raised decks and porches should have guardrails on the edges.  Have any leaves, snow, or ice cleared regularly.  Use sand or salt on walking paths during winter.  Clean up any spills in your garage right away. This includes oil or grease spills. What can I do in the bathroom?  Use night lights.  Install grab bars by the toilet and in the tub and shower. Do not use towel bars as grab bars.  Use non-skid mats or decals in the tub or shower.  If you need to sit down in the shower, use  a plastic, non-slip stool.  Keep the floor dry. Clean up any water that spills on the floor as soon as it happens.  Remove soap buildup in the tub or shower regularly.  Attach bath mats securely with double-sided non-slip rug tape.  Do not have throw rugs and other things on the floor that can make you trip. What can I do in the bedroom?  Use night lights.  Make sure that you have a light by your bed that is easy to reach.  Do not use any sheets or blankets that are too big for your bed. They should not hang down onto the floor.  Have a firm chair that has side arms. You can use this for support while you get dressed.  Do not have throw rugs and other things on the floor that can make you trip. What can I do in the kitchen?  Clean up any spills right away.  Avoid walking on wet floors.  Keep items that you use a lot in easy-to-reach places.  If you need to reach something above you, use a strong step stool that has a grab bar.  Keep electrical cords out of the way.  Do not use floor polish or wax that makes floors slippery. If you must use wax, use  non-skid floor wax.  Do not have throw rugs and other things on the floor that can make you trip. What can I do with my stairs?  Do not leave any items on the stairs.  Make sure that there are handrails on both sides of the stairs and use them. Fix handrails that are broken or loose. Make sure that handrails are as long as the stairways.  Check any carpeting to make sure that it is firmly attached to the stairs. Fix any carpet that is loose or worn.  Avoid having throw rugs at the top or bottom of the stairs. If you do have throw rugs, attach them to the floor with carpet tape.  Make sure that you have a light switch at the top of the stairs and the bottom of the stairs. If you do not have them, ask someone to add them for you. What else can I do to help prevent falls?  Wear shoes that:  Do not have high heels.  Have  rubber bottoms.  Are comfortable and fit you well.  Are closed at the toe. Do not wear sandals.  If you use a stepladder:  Make sure that it is fully opened. Do not climb a closed stepladder.  Make sure that both sides of the stepladder are locked into place.  Ask someone to hold it for you, if possible.  Clearly mark and make sure that you can see:  Any grab bars or handrails.  First and last steps.  Where the edge of each step is.  Use tools that help you move around (mobility aids) if they are needed. These include:  Canes.  Walkers.  Scooters.  Crutches.  Turn on the lights when you go into a dark area. Replace any light bulbs as soon as they burn out.  Set up your furniture so you have a clear path. Avoid moving your furniture around.  If any of your floors are uneven, fix them.  If there are any pets around you, be aware of where they are.  Review your medicines with your doctor. Some medicines can make you feel dizzy. This can increase your chance of falling. Ask your doctor what other things that you can do to help prevent falls. This information is not intended to replace advice given to you by your health care provider. Make sure you discuss any questions you have with your health care provider. Document Released: 09/14/2009 Document Revised: 04/25/2016 Document Reviewed: 12/23/2014 Elsevier Interactive Patient Education  2017 [employer].

## 2020-06-08 DIAGNOSIS — N2581 Secondary hyperparathyroidism of renal origin: Secondary | ICD-10-CM | POA: Diagnosis not present

## 2020-06-08 DIAGNOSIS — N186 End stage renal disease: Secondary | ICD-10-CM | POA: Diagnosis not present

## 2020-06-08 DIAGNOSIS — Z992 Dependence on renal dialysis: Secondary | ICD-10-CM | POA: Diagnosis not present

## 2020-06-08 DIAGNOSIS — D689 Coagulation defect, unspecified: Secondary | ICD-10-CM | POA: Diagnosis not present

## 2020-06-08 DIAGNOSIS — E876 Hypokalemia: Secondary | ICD-10-CM | POA: Diagnosis not present

## 2020-06-08 DIAGNOSIS — E1129 Type 2 diabetes mellitus with other diabetic kidney complication: Secondary | ICD-10-CM | POA: Diagnosis not present

## 2020-06-08 LAB — CMP14 + ANION GAP
ALT: 27 IU/L (ref 0–32)
AST: 28 IU/L (ref 0–40)
Albumin/Globulin Ratio: 1.3 (ref 1.2–2.2)
Albumin: 4 g/dL (ref 3.7–4.7)
Alkaline Phosphatase: 172 IU/L — ABNORMAL HIGH (ref 48–121)
Anion Gap: 21 mmol/L — ABNORMAL HIGH (ref 10.0–18.0)
BUN/Creatinine Ratio: 4 — ABNORMAL LOW (ref 12–28)
BUN: 35 mg/dL — ABNORMAL HIGH (ref 8–27)
Bilirubin Total: 0.5 mg/dL (ref 0.0–1.2)
CO2: 26 mmol/L (ref 20–29)
Calcium: 9 mg/dL (ref 8.7–10.3)
Chloride: 95 mmol/L — ABNORMAL LOW (ref 96–106)
Creatinine, Ser: 8.54 mg/dL — ABNORMAL HIGH (ref 0.57–1.00)
GFR calc Af Amer: 5 mL/min/{1.73_m2} — ABNORMAL LOW (ref >59)
GFR calc non Af Amer: 4 mL/min/{1.73_m2} — ABNORMAL LOW (ref >59)
Globulin, Total: 3 g/dL (ref 1.5–4.5)
Glucose: 152 mg/dL — ABNORMAL HIGH (ref 65–99)
Potassium: 4.1 mmol/L (ref 3.5–5.2)
Sodium: 142 mmol/L (ref 134–144)
Total Protein: 7 g/dL (ref 6.0–8.5)

## 2020-06-08 LAB — HEMOGLOBIN A1C
Est. average glucose Bld gHb Est-mCnc: 148 mg/dL
Hgb A1c MFr Bld: 6.8 % — ABNORMAL HIGH (ref 4.8–5.6)

## 2020-06-08 LAB — LIPID PANEL
Chol/HDL Ratio: 2.8 ratio (ref 0.0–4.4)
Cholesterol, Total: 136 mg/dL (ref 100–199)
HDL: 48 mg/dL (ref >39)
LDL Chol Calc (NIH): 71 mg/dL (ref 0–99)
Triglycerides: 87 mg/dL (ref 0–149)
VLDL Cholesterol Cal: 17 mg/dL (ref 5–40)

## 2020-06-10 DIAGNOSIS — E1129 Type 2 diabetes mellitus with other diabetic kidney complication: Secondary | ICD-10-CM | POA: Diagnosis not present

## 2020-06-10 DIAGNOSIS — D689 Coagulation defect, unspecified: Secondary | ICD-10-CM | POA: Diagnosis not present

## 2020-06-10 DIAGNOSIS — Z992 Dependence on renal dialysis: Secondary | ICD-10-CM | POA: Diagnosis not present

## 2020-06-10 DIAGNOSIS — E876 Hypokalemia: Secondary | ICD-10-CM | POA: Diagnosis not present

## 2020-06-10 DIAGNOSIS — N186 End stage renal disease: Secondary | ICD-10-CM | POA: Diagnosis not present

## 2020-06-10 DIAGNOSIS — N2581 Secondary hyperparathyroidism of renal origin: Secondary | ICD-10-CM | POA: Diagnosis not present

## 2020-06-13 DIAGNOSIS — E1129 Type 2 diabetes mellitus with other diabetic kidney complication: Secondary | ICD-10-CM | POA: Diagnosis not present

## 2020-06-13 DIAGNOSIS — E876 Hypokalemia: Secondary | ICD-10-CM | POA: Diagnosis not present

## 2020-06-13 DIAGNOSIS — N2581 Secondary hyperparathyroidism of renal origin: Secondary | ICD-10-CM | POA: Diagnosis not present

## 2020-06-13 DIAGNOSIS — D689 Coagulation defect, unspecified: Secondary | ICD-10-CM | POA: Diagnosis not present

## 2020-06-13 DIAGNOSIS — Z992 Dependence on renal dialysis: Secondary | ICD-10-CM | POA: Diagnosis not present

## 2020-06-13 DIAGNOSIS — N186 End stage renal disease: Secondary | ICD-10-CM | POA: Diagnosis not present

## 2020-06-14 DIAGNOSIS — D689 Coagulation defect, unspecified: Secondary | ICD-10-CM | POA: Diagnosis not present

## 2020-06-14 DIAGNOSIS — N2581 Secondary hyperparathyroidism of renal origin: Secondary | ICD-10-CM | POA: Diagnosis not present

## 2020-06-14 DIAGNOSIS — E1129 Type 2 diabetes mellitus with other diabetic kidney complication: Secondary | ICD-10-CM | POA: Diagnosis not present

## 2020-06-14 DIAGNOSIS — E876 Hypokalemia: Secondary | ICD-10-CM | POA: Diagnosis not present

## 2020-06-14 DIAGNOSIS — Z992 Dependence on renal dialysis: Secondary | ICD-10-CM | POA: Diagnosis not present

## 2020-06-14 DIAGNOSIS — N186 End stage renal disease: Secondary | ICD-10-CM | POA: Diagnosis not present

## 2020-06-15 DIAGNOSIS — N2581 Secondary hyperparathyroidism of renal origin: Secondary | ICD-10-CM | POA: Diagnosis not present

## 2020-06-15 DIAGNOSIS — E1129 Type 2 diabetes mellitus with other diabetic kidney complication: Secondary | ICD-10-CM | POA: Diagnosis not present

## 2020-06-15 DIAGNOSIS — E876 Hypokalemia: Secondary | ICD-10-CM | POA: Diagnosis not present

## 2020-06-15 DIAGNOSIS — D689 Coagulation defect, unspecified: Secondary | ICD-10-CM | POA: Diagnosis not present

## 2020-06-15 DIAGNOSIS — N186 End stage renal disease: Secondary | ICD-10-CM | POA: Diagnosis not present

## 2020-06-15 DIAGNOSIS — Z992 Dependence on renal dialysis: Secondary | ICD-10-CM | POA: Diagnosis not present

## 2020-06-17 DIAGNOSIS — D689 Coagulation defect, unspecified: Secondary | ICD-10-CM | POA: Diagnosis not present

## 2020-06-17 DIAGNOSIS — N2581 Secondary hyperparathyroidism of renal origin: Secondary | ICD-10-CM | POA: Diagnosis not present

## 2020-06-17 DIAGNOSIS — E1129 Type 2 diabetes mellitus with other diabetic kidney complication: Secondary | ICD-10-CM | POA: Diagnosis not present

## 2020-06-17 DIAGNOSIS — Z992 Dependence on renal dialysis: Secondary | ICD-10-CM | POA: Diagnosis not present

## 2020-06-17 DIAGNOSIS — E876 Hypokalemia: Secondary | ICD-10-CM | POA: Diagnosis not present

## 2020-06-17 DIAGNOSIS — N186 End stage renal disease: Secondary | ICD-10-CM | POA: Diagnosis not present

## 2020-06-20 DIAGNOSIS — D689 Coagulation defect, unspecified: Secondary | ICD-10-CM | POA: Diagnosis not present

## 2020-06-20 DIAGNOSIS — E876 Hypokalemia: Secondary | ICD-10-CM | POA: Diagnosis not present

## 2020-06-20 DIAGNOSIS — Z992 Dependence on renal dialysis: Secondary | ICD-10-CM | POA: Diagnosis not present

## 2020-06-20 DIAGNOSIS — E1129 Type 2 diabetes mellitus with other diabetic kidney complication: Secondary | ICD-10-CM | POA: Diagnosis not present

## 2020-06-20 DIAGNOSIS — N2581 Secondary hyperparathyroidism of renal origin: Secondary | ICD-10-CM | POA: Diagnosis not present

## 2020-06-20 DIAGNOSIS — N186 End stage renal disease: Secondary | ICD-10-CM | POA: Diagnosis not present

## 2020-06-22 DIAGNOSIS — N186 End stage renal disease: Secondary | ICD-10-CM | POA: Diagnosis not present

## 2020-06-22 DIAGNOSIS — N2581 Secondary hyperparathyroidism of renal origin: Secondary | ICD-10-CM | POA: Diagnosis not present

## 2020-06-22 DIAGNOSIS — E876 Hypokalemia: Secondary | ICD-10-CM | POA: Diagnosis not present

## 2020-06-22 DIAGNOSIS — Z992 Dependence on renal dialysis: Secondary | ICD-10-CM | POA: Diagnosis not present

## 2020-06-22 DIAGNOSIS — E1129 Type 2 diabetes mellitus with other diabetic kidney complication: Secondary | ICD-10-CM | POA: Diagnosis not present

## 2020-06-22 DIAGNOSIS — D689 Coagulation defect, unspecified: Secondary | ICD-10-CM | POA: Diagnosis not present

## 2020-06-24 DIAGNOSIS — E876 Hypokalemia: Secondary | ICD-10-CM | POA: Diagnosis not present

## 2020-06-24 DIAGNOSIS — N186 End stage renal disease: Secondary | ICD-10-CM | POA: Diagnosis not present

## 2020-06-24 DIAGNOSIS — E1129 Type 2 diabetes mellitus with other diabetic kidney complication: Secondary | ICD-10-CM | POA: Diagnosis not present

## 2020-06-24 DIAGNOSIS — N2581 Secondary hyperparathyroidism of renal origin: Secondary | ICD-10-CM | POA: Diagnosis not present

## 2020-06-24 DIAGNOSIS — D689 Coagulation defect, unspecified: Secondary | ICD-10-CM | POA: Diagnosis not present

## 2020-06-24 DIAGNOSIS — Z992 Dependence on renal dialysis: Secondary | ICD-10-CM | POA: Diagnosis not present

## 2020-06-27 DIAGNOSIS — E876 Hypokalemia: Secondary | ICD-10-CM | POA: Diagnosis not present

## 2020-06-27 DIAGNOSIS — Z992 Dependence on renal dialysis: Secondary | ICD-10-CM | POA: Diagnosis not present

## 2020-06-27 DIAGNOSIS — N186 End stage renal disease: Secondary | ICD-10-CM | POA: Diagnosis not present

## 2020-06-27 DIAGNOSIS — E1129 Type 2 diabetes mellitus with other diabetic kidney complication: Secondary | ICD-10-CM | POA: Diagnosis not present

## 2020-06-27 DIAGNOSIS — D689 Coagulation defect, unspecified: Secondary | ICD-10-CM | POA: Diagnosis not present

## 2020-06-27 DIAGNOSIS — N2581 Secondary hyperparathyroidism of renal origin: Secondary | ICD-10-CM | POA: Diagnosis not present

## 2020-06-29 DIAGNOSIS — E1129 Type 2 diabetes mellitus with other diabetic kidney complication: Secondary | ICD-10-CM | POA: Diagnosis not present

## 2020-06-29 DIAGNOSIS — E876 Hypokalemia: Secondary | ICD-10-CM | POA: Diagnosis not present

## 2020-06-29 DIAGNOSIS — T782XXA Anaphylactic shock, unspecified, initial encounter: Secondary | ICD-10-CM | POA: Insufficient documentation

## 2020-06-29 DIAGNOSIS — N186 End stage renal disease: Secondary | ICD-10-CM | POA: Diagnosis not present

## 2020-06-29 DIAGNOSIS — N2581 Secondary hyperparathyroidism of renal origin: Secondary | ICD-10-CM | POA: Diagnosis not present

## 2020-06-29 DIAGNOSIS — D689 Coagulation defect, unspecified: Secondary | ICD-10-CM | POA: Diagnosis not present

## 2020-06-29 DIAGNOSIS — Z992 Dependence on renal dialysis: Secondary | ICD-10-CM | POA: Diagnosis not present

## 2020-07-01 DIAGNOSIS — D689 Coagulation defect, unspecified: Secondary | ICD-10-CM | POA: Diagnosis not present

## 2020-07-01 DIAGNOSIS — E876 Hypokalemia: Secondary | ICD-10-CM | POA: Diagnosis not present

## 2020-07-01 DIAGNOSIS — N186 End stage renal disease: Secondary | ICD-10-CM | POA: Diagnosis not present

## 2020-07-01 DIAGNOSIS — Z992 Dependence on renal dialysis: Secondary | ICD-10-CM | POA: Diagnosis not present

## 2020-07-01 DIAGNOSIS — E1129 Type 2 diabetes mellitus with other diabetic kidney complication: Secondary | ICD-10-CM | POA: Diagnosis not present

## 2020-07-01 DIAGNOSIS — N2581 Secondary hyperparathyroidism of renal origin: Secondary | ICD-10-CM | POA: Diagnosis not present

## 2020-07-04 DIAGNOSIS — D689 Coagulation defect, unspecified: Secondary | ICD-10-CM | POA: Diagnosis not present

## 2020-07-04 DIAGNOSIS — N186 End stage renal disease: Secondary | ICD-10-CM | POA: Diagnosis not present

## 2020-07-04 DIAGNOSIS — N2581 Secondary hyperparathyroidism of renal origin: Secondary | ICD-10-CM | POA: Diagnosis not present

## 2020-07-04 DIAGNOSIS — E876 Hypokalemia: Secondary | ICD-10-CM | POA: Diagnosis not present

## 2020-07-04 DIAGNOSIS — Z992 Dependence on renal dialysis: Secondary | ICD-10-CM | POA: Diagnosis not present

## 2020-07-06 DIAGNOSIS — E876 Hypokalemia: Secondary | ICD-10-CM | POA: Diagnosis not present

## 2020-07-06 DIAGNOSIS — D689 Coagulation defect, unspecified: Secondary | ICD-10-CM | POA: Diagnosis not present

## 2020-07-06 DIAGNOSIS — N2581 Secondary hyperparathyroidism of renal origin: Secondary | ICD-10-CM | POA: Diagnosis not present

## 2020-07-06 DIAGNOSIS — Z992 Dependence on renal dialysis: Secondary | ICD-10-CM | POA: Diagnosis not present

## 2020-07-06 DIAGNOSIS — N186 End stage renal disease: Secondary | ICD-10-CM | POA: Diagnosis not present

## 2020-07-07 DIAGNOSIS — Z992 Dependence on renal dialysis: Secondary | ICD-10-CM | POA: Diagnosis not present

## 2020-07-07 DIAGNOSIS — N2581 Secondary hyperparathyroidism of renal origin: Secondary | ICD-10-CM | POA: Diagnosis not present

## 2020-07-07 DIAGNOSIS — E876 Hypokalemia: Secondary | ICD-10-CM | POA: Diagnosis not present

## 2020-07-07 DIAGNOSIS — N186 End stage renal disease: Secondary | ICD-10-CM | POA: Diagnosis not present

## 2020-07-07 DIAGNOSIS — D689 Coagulation defect, unspecified: Secondary | ICD-10-CM | POA: Diagnosis not present

## 2020-07-08 DIAGNOSIS — D689 Coagulation defect, unspecified: Secondary | ICD-10-CM | POA: Diagnosis not present

## 2020-07-08 DIAGNOSIS — E876 Hypokalemia: Secondary | ICD-10-CM | POA: Diagnosis not present

## 2020-07-08 DIAGNOSIS — Z992 Dependence on renal dialysis: Secondary | ICD-10-CM | POA: Diagnosis not present

## 2020-07-08 DIAGNOSIS — N2581 Secondary hyperparathyroidism of renal origin: Secondary | ICD-10-CM | POA: Diagnosis not present

## 2020-07-08 DIAGNOSIS — N186 End stage renal disease: Secondary | ICD-10-CM | POA: Diagnosis not present

## 2020-07-11 ENCOUNTER — Encounter: Payer: Self-pay | Admitting: Nurse Practitioner

## 2020-07-11 DIAGNOSIS — N2581 Secondary hyperparathyroidism of renal origin: Secondary | ICD-10-CM | POA: Diagnosis not present

## 2020-07-11 DIAGNOSIS — Z992 Dependence on renal dialysis: Secondary | ICD-10-CM | POA: Diagnosis not present

## 2020-07-11 DIAGNOSIS — E876 Hypokalemia: Secondary | ICD-10-CM | POA: Diagnosis not present

## 2020-07-11 DIAGNOSIS — D689 Coagulation defect, unspecified: Secondary | ICD-10-CM | POA: Diagnosis not present

## 2020-07-11 DIAGNOSIS — N186 End stage renal disease: Secondary | ICD-10-CM | POA: Diagnosis not present

## 2020-07-13 DIAGNOSIS — Z992 Dependence on renal dialysis: Secondary | ICD-10-CM | POA: Diagnosis not present

## 2020-07-13 DIAGNOSIS — N2581 Secondary hyperparathyroidism of renal origin: Secondary | ICD-10-CM | POA: Diagnosis not present

## 2020-07-13 DIAGNOSIS — E876 Hypokalemia: Secondary | ICD-10-CM | POA: Diagnosis not present

## 2020-07-13 DIAGNOSIS — N186 End stage renal disease: Secondary | ICD-10-CM | POA: Diagnosis not present

## 2020-07-13 DIAGNOSIS — D689 Coagulation defect, unspecified: Secondary | ICD-10-CM | POA: Diagnosis not present

## 2020-07-15 ENCOUNTER — Other Ambulatory Visit: Payer: Self-pay | Admitting: Nurse Practitioner

## 2020-07-15 DIAGNOSIS — N2581 Secondary hyperparathyroidism of renal origin: Secondary | ICD-10-CM | POA: Diagnosis not present

## 2020-07-15 DIAGNOSIS — E876 Hypokalemia: Secondary | ICD-10-CM | POA: Diagnosis not present

## 2020-07-15 DIAGNOSIS — N186 End stage renal disease: Secondary | ICD-10-CM | POA: Diagnosis not present

## 2020-07-15 DIAGNOSIS — Z992 Dependence on renal dialysis: Secondary | ICD-10-CM | POA: Diagnosis not present

## 2020-07-15 DIAGNOSIS — D689 Coagulation defect, unspecified: Secondary | ICD-10-CM | POA: Diagnosis not present

## 2020-07-18 DIAGNOSIS — N186 End stage renal disease: Secondary | ICD-10-CM | POA: Diagnosis not present

## 2020-07-18 DIAGNOSIS — N2581 Secondary hyperparathyroidism of renal origin: Secondary | ICD-10-CM | POA: Diagnosis not present

## 2020-07-18 DIAGNOSIS — Z992 Dependence on renal dialysis: Secondary | ICD-10-CM | POA: Diagnosis not present

## 2020-07-18 DIAGNOSIS — D689 Coagulation defect, unspecified: Secondary | ICD-10-CM | POA: Diagnosis not present

## 2020-07-18 DIAGNOSIS — E876 Hypokalemia: Secondary | ICD-10-CM | POA: Diagnosis not present

## 2020-07-20 DIAGNOSIS — N186 End stage renal disease: Secondary | ICD-10-CM | POA: Diagnosis not present

## 2020-07-20 DIAGNOSIS — D689 Coagulation defect, unspecified: Secondary | ICD-10-CM | POA: Diagnosis not present

## 2020-07-20 DIAGNOSIS — E876 Hypokalemia: Secondary | ICD-10-CM | POA: Diagnosis not present

## 2020-07-20 DIAGNOSIS — Z992 Dependence on renal dialysis: Secondary | ICD-10-CM | POA: Diagnosis not present

## 2020-07-20 DIAGNOSIS — N2581 Secondary hyperparathyroidism of renal origin: Secondary | ICD-10-CM | POA: Diagnosis not present

## 2020-07-22 DIAGNOSIS — D689 Coagulation defect, unspecified: Secondary | ICD-10-CM | POA: Diagnosis not present

## 2020-07-22 DIAGNOSIS — N186 End stage renal disease: Secondary | ICD-10-CM | POA: Diagnosis not present

## 2020-07-22 DIAGNOSIS — E876 Hypokalemia: Secondary | ICD-10-CM | POA: Diagnosis not present

## 2020-07-22 DIAGNOSIS — N2581 Secondary hyperparathyroidism of renal origin: Secondary | ICD-10-CM | POA: Diagnosis not present

## 2020-07-22 DIAGNOSIS — Z992 Dependence on renal dialysis: Secondary | ICD-10-CM | POA: Diagnosis not present

## 2020-07-25 DIAGNOSIS — N2581 Secondary hyperparathyroidism of renal origin: Secondary | ICD-10-CM | POA: Diagnosis not present

## 2020-07-25 DIAGNOSIS — Z992 Dependence on renal dialysis: Secondary | ICD-10-CM | POA: Diagnosis not present

## 2020-07-25 DIAGNOSIS — D689 Coagulation defect, unspecified: Secondary | ICD-10-CM | POA: Diagnosis not present

## 2020-07-25 DIAGNOSIS — N186 End stage renal disease: Secondary | ICD-10-CM | POA: Diagnosis not present

## 2020-07-25 DIAGNOSIS — E876 Hypokalemia: Secondary | ICD-10-CM | POA: Diagnosis not present

## 2020-07-27 DIAGNOSIS — D689 Coagulation defect, unspecified: Secondary | ICD-10-CM | POA: Diagnosis not present

## 2020-07-27 DIAGNOSIS — E876 Hypokalemia: Secondary | ICD-10-CM | POA: Diagnosis not present

## 2020-07-27 DIAGNOSIS — N2581 Secondary hyperparathyroidism of renal origin: Secondary | ICD-10-CM | POA: Diagnosis not present

## 2020-07-27 DIAGNOSIS — Z992 Dependence on renal dialysis: Secondary | ICD-10-CM | POA: Diagnosis not present

## 2020-07-27 DIAGNOSIS — N186 End stage renal disease: Secondary | ICD-10-CM | POA: Diagnosis not present

## 2020-07-29 DIAGNOSIS — N186 End stage renal disease: Secondary | ICD-10-CM | POA: Diagnosis not present

## 2020-07-29 DIAGNOSIS — Z992 Dependence on renal dialysis: Secondary | ICD-10-CM | POA: Diagnosis not present

## 2020-07-29 DIAGNOSIS — E876 Hypokalemia: Secondary | ICD-10-CM | POA: Diagnosis not present

## 2020-07-29 DIAGNOSIS — D689 Coagulation defect, unspecified: Secondary | ICD-10-CM | POA: Diagnosis not present

## 2020-07-29 DIAGNOSIS — N2581 Secondary hyperparathyroidism of renal origin: Secondary | ICD-10-CM | POA: Diagnosis not present

## 2020-08-01 DIAGNOSIS — N186 End stage renal disease: Secondary | ICD-10-CM | POA: Diagnosis not present

## 2020-08-01 DIAGNOSIS — Z992 Dependence on renal dialysis: Secondary | ICD-10-CM | POA: Diagnosis not present

## 2020-08-01 DIAGNOSIS — N2581 Secondary hyperparathyroidism of renal origin: Secondary | ICD-10-CM | POA: Diagnosis not present

## 2020-08-01 DIAGNOSIS — E1129 Type 2 diabetes mellitus with other diabetic kidney complication: Secondary | ICD-10-CM | POA: Diagnosis not present

## 2020-08-01 DIAGNOSIS — D689 Coagulation defect, unspecified: Secondary | ICD-10-CM | POA: Diagnosis not present

## 2020-08-01 DIAGNOSIS — E876 Hypokalemia: Secondary | ICD-10-CM | POA: Diagnosis not present

## 2020-08-03 DIAGNOSIS — E876 Hypokalemia: Secondary | ICD-10-CM | POA: Diagnosis not present

## 2020-08-03 DIAGNOSIS — N186 End stage renal disease: Secondary | ICD-10-CM | POA: Diagnosis not present

## 2020-08-03 DIAGNOSIS — E1129 Type 2 diabetes mellitus with other diabetic kidney complication: Secondary | ICD-10-CM | POA: Diagnosis not present

## 2020-08-03 DIAGNOSIS — Z23 Encounter for immunization: Secondary | ICD-10-CM | POA: Diagnosis not present

## 2020-08-03 DIAGNOSIS — Z992 Dependence on renal dialysis: Secondary | ICD-10-CM | POA: Diagnosis not present

## 2020-08-03 DIAGNOSIS — N2581 Secondary hyperparathyroidism of renal origin: Secondary | ICD-10-CM | POA: Diagnosis not present

## 2020-08-03 DIAGNOSIS — D689 Coagulation defect, unspecified: Secondary | ICD-10-CM | POA: Diagnosis not present

## 2020-08-05 DIAGNOSIS — N2581 Secondary hyperparathyroidism of renal origin: Secondary | ICD-10-CM | POA: Diagnosis not present

## 2020-08-05 DIAGNOSIS — Z23 Encounter for immunization: Secondary | ICD-10-CM | POA: Diagnosis not present

## 2020-08-05 DIAGNOSIS — N186 End stage renal disease: Secondary | ICD-10-CM | POA: Diagnosis not present

## 2020-08-05 DIAGNOSIS — D689 Coagulation defect, unspecified: Secondary | ICD-10-CM | POA: Diagnosis not present

## 2020-08-05 DIAGNOSIS — E1129 Type 2 diabetes mellitus with other diabetic kidney complication: Secondary | ICD-10-CM | POA: Diagnosis not present

## 2020-08-05 DIAGNOSIS — Z992 Dependence on renal dialysis: Secondary | ICD-10-CM | POA: Diagnosis not present

## 2020-08-05 DIAGNOSIS — E876 Hypokalemia: Secondary | ICD-10-CM | POA: Diagnosis not present

## 2020-08-08 DIAGNOSIS — N2581 Secondary hyperparathyroidism of renal origin: Secondary | ICD-10-CM | POA: Diagnosis not present

## 2020-08-08 DIAGNOSIS — D689 Coagulation defect, unspecified: Secondary | ICD-10-CM | POA: Diagnosis not present

## 2020-08-08 DIAGNOSIS — N186 End stage renal disease: Secondary | ICD-10-CM | POA: Diagnosis not present

## 2020-08-08 DIAGNOSIS — E876 Hypokalemia: Secondary | ICD-10-CM | POA: Diagnosis not present

## 2020-08-08 DIAGNOSIS — Z992 Dependence on renal dialysis: Secondary | ICD-10-CM | POA: Diagnosis not present

## 2020-08-08 DIAGNOSIS — Z23 Encounter for immunization: Secondary | ICD-10-CM | POA: Diagnosis not present

## 2020-08-08 DIAGNOSIS — E1129 Type 2 diabetes mellitus with other diabetic kidney complication: Secondary | ICD-10-CM | POA: Diagnosis not present

## 2020-08-10 DIAGNOSIS — N186 End stage renal disease: Secondary | ICD-10-CM | POA: Diagnosis not present

## 2020-08-10 DIAGNOSIS — N2581 Secondary hyperparathyroidism of renal origin: Secondary | ICD-10-CM | POA: Diagnosis not present

## 2020-08-10 DIAGNOSIS — D689 Coagulation defect, unspecified: Secondary | ICD-10-CM | POA: Diagnosis not present

## 2020-08-10 DIAGNOSIS — E876 Hypokalemia: Secondary | ICD-10-CM | POA: Diagnosis not present

## 2020-08-10 DIAGNOSIS — Z23 Encounter for immunization: Secondary | ICD-10-CM | POA: Diagnosis not present

## 2020-08-10 DIAGNOSIS — Z992 Dependence on renal dialysis: Secondary | ICD-10-CM | POA: Diagnosis not present

## 2020-08-10 DIAGNOSIS — E1129 Type 2 diabetes mellitus with other diabetic kidney complication: Secondary | ICD-10-CM | POA: Diagnosis not present

## 2020-08-12 DIAGNOSIS — E1129 Type 2 diabetes mellitus with other diabetic kidney complication: Secondary | ICD-10-CM | POA: Diagnosis not present

## 2020-08-12 DIAGNOSIS — Z23 Encounter for immunization: Secondary | ICD-10-CM | POA: Diagnosis not present

## 2020-08-12 DIAGNOSIS — D689 Coagulation defect, unspecified: Secondary | ICD-10-CM | POA: Diagnosis not present

## 2020-08-12 DIAGNOSIS — Z992 Dependence on renal dialysis: Secondary | ICD-10-CM | POA: Diagnosis not present

## 2020-08-12 DIAGNOSIS — E876 Hypokalemia: Secondary | ICD-10-CM | POA: Diagnosis not present

## 2020-08-12 DIAGNOSIS — N186 End stage renal disease: Secondary | ICD-10-CM | POA: Diagnosis not present

## 2020-08-12 DIAGNOSIS — N2581 Secondary hyperparathyroidism of renal origin: Secondary | ICD-10-CM | POA: Diagnosis not present

## 2020-08-15 DIAGNOSIS — Z992 Dependence on renal dialysis: Secondary | ICD-10-CM | POA: Diagnosis not present

## 2020-08-15 DIAGNOSIS — E876 Hypokalemia: Secondary | ICD-10-CM | POA: Diagnosis not present

## 2020-08-15 DIAGNOSIS — D689 Coagulation defect, unspecified: Secondary | ICD-10-CM | POA: Diagnosis not present

## 2020-08-15 DIAGNOSIS — N2581 Secondary hyperparathyroidism of renal origin: Secondary | ICD-10-CM | POA: Diagnosis not present

## 2020-08-15 DIAGNOSIS — Z23 Encounter for immunization: Secondary | ICD-10-CM | POA: Diagnosis not present

## 2020-08-15 DIAGNOSIS — E1129 Type 2 diabetes mellitus with other diabetic kidney complication: Secondary | ICD-10-CM | POA: Diagnosis not present

## 2020-08-15 DIAGNOSIS — N186 End stage renal disease: Secondary | ICD-10-CM | POA: Diagnosis not present

## 2020-08-16 ENCOUNTER — Ambulatory Visit: Payer: Medicare Other | Admitting: Podiatry

## 2020-08-17 DIAGNOSIS — N2581 Secondary hyperparathyroidism of renal origin: Secondary | ICD-10-CM | POA: Diagnosis not present

## 2020-08-17 DIAGNOSIS — N186 End stage renal disease: Secondary | ICD-10-CM | POA: Diagnosis not present

## 2020-08-17 DIAGNOSIS — Z23 Encounter for immunization: Secondary | ICD-10-CM | POA: Diagnosis not present

## 2020-08-17 DIAGNOSIS — E1129 Type 2 diabetes mellitus with other diabetic kidney complication: Secondary | ICD-10-CM | POA: Diagnosis not present

## 2020-08-17 DIAGNOSIS — Z992 Dependence on renal dialysis: Secondary | ICD-10-CM | POA: Diagnosis not present

## 2020-08-17 DIAGNOSIS — D689 Coagulation defect, unspecified: Secondary | ICD-10-CM | POA: Diagnosis not present

## 2020-08-17 DIAGNOSIS — E876 Hypokalemia: Secondary | ICD-10-CM | POA: Diagnosis not present

## 2020-08-19 DIAGNOSIS — Z992 Dependence on renal dialysis: Secondary | ICD-10-CM | POA: Diagnosis not present

## 2020-08-19 DIAGNOSIS — N2581 Secondary hyperparathyroidism of renal origin: Secondary | ICD-10-CM | POA: Diagnosis not present

## 2020-08-19 DIAGNOSIS — E1129 Type 2 diabetes mellitus with other diabetic kidney complication: Secondary | ICD-10-CM | POA: Diagnosis not present

## 2020-08-19 DIAGNOSIS — Z23 Encounter for immunization: Secondary | ICD-10-CM | POA: Diagnosis not present

## 2020-08-19 DIAGNOSIS — E876 Hypokalemia: Secondary | ICD-10-CM | POA: Diagnosis not present

## 2020-08-19 DIAGNOSIS — D689 Coagulation defect, unspecified: Secondary | ICD-10-CM | POA: Diagnosis not present

## 2020-08-19 DIAGNOSIS — N186 End stage renal disease: Secondary | ICD-10-CM | POA: Diagnosis not present

## 2020-08-22 DIAGNOSIS — E1129 Type 2 diabetes mellitus with other diabetic kidney complication: Secondary | ICD-10-CM | POA: Diagnosis not present

## 2020-08-22 DIAGNOSIS — E876 Hypokalemia: Secondary | ICD-10-CM | POA: Diagnosis not present

## 2020-08-22 DIAGNOSIS — Z23 Encounter for immunization: Secondary | ICD-10-CM | POA: Diagnosis not present

## 2020-08-22 DIAGNOSIS — N186 End stage renal disease: Secondary | ICD-10-CM | POA: Diagnosis not present

## 2020-08-22 DIAGNOSIS — N2581 Secondary hyperparathyroidism of renal origin: Secondary | ICD-10-CM | POA: Diagnosis not present

## 2020-08-22 DIAGNOSIS — D689 Coagulation defect, unspecified: Secondary | ICD-10-CM | POA: Diagnosis not present

## 2020-08-22 DIAGNOSIS — Z992 Dependence on renal dialysis: Secondary | ICD-10-CM | POA: Diagnosis not present

## 2020-08-24 DIAGNOSIS — E1129 Type 2 diabetes mellitus with other diabetic kidney complication: Secondary | ICD-10-CM | POA: Diagnosis not present

## 2020-08-24 DIAGNOSIS — D689 Coagulation defect, unspecified: Secondary | ICD-10-CM | POA: Diagnosis not present

## 2020-08-24 DIAGNOSIS — N186 End stage renal disease: Secondary | ICD-10-CM | POA: Diagnosis not present

## 2020-08-24 DIAGNOSIS — E876 Hypokalemia: Secondary | ICD-10-CM | POA: Diagnosis not present

## 2020-08-24 DIAGNOSIS — Z992 Dependence on renal dialysis: Secondary | ICD-10-CM | POA: Diagnosis not present

## 2020-08-24 DIAGNOSIS — N2581 Secondary hyperparathyroidism of renal origin: Secondary | ICD-10-CM | POA: Diagnosis not present

## 2020-08-24 DIAGNOSIS — Z23 Encounter for immunization: Secondary | ICD-10-CM | POA: Diagnosis not present

## 2020-08-26 DIAGNOSIS — Z992 Dependence on renal dialysis: Secondary | ICD-10-CM | POA: Diagnosis not present

## 2020-08-26 DIAGNOSIS — N186 End stage renal disease: Secondary | ICD-10-CM | POA: Diagnosis not present

## 2020-08-26 DIAGNOSIS — E1129 Type 2 diabetes mellitus with other diabetic kidney complication: Secondary | ICD-10-CM | POA: Diagnosis not present

## 2020-08-26 DIAGNOSIS — E876 Hypokalemia: Secondary | ICD-10-CM | POA: Diagnosis not present

## 2020-08-26 DIAGNOSIS — N2581 Secondary hyperparathyroidism of renal origin: Secondary | ICD-10-CM | POA: Diagnosis not present

## 2020-08-26 DIAGNOSIS — D689 Coagulation defect, unspecified: Secondary | ICD-10-CM | POA: Diagnosis not present

## 2020-08-26 DIAGNOSIS — Z23 Encounter for immunization: Secondary | ICD-10-CM | POA: Diagnosis not present

## 2020-08-29 DIAGNOSIS — Z992 Dependence on renal dialysis: Secondary | ICD-10-CM | POA: Diagnosis not present

## 2020-08-29 DIAGNOSIS — N2581 Secondary hyperparathyroidism of renal origin: Secondary | ICD-10-CM | POA: Diagnosis not present

## 2020-08-29 DIAGNOSIS — E876 Hypokalemia: Secondary | ICD-10-CM | POA: Diagnosis not present

## 2020-08-29 DIAGNOSIS — Z23 Encounter for immunization: Secondary | ICD-10-CM | POA: Diagnosis not present

## 2020-08-29 DIAGNOSIS — D689 Coagulation defect, unspecified: Secondary | ICD-10-CM | POA: Diagnosis not present

## 2020-08-29 DIAGNOSIS — N186 End stage renal disease: Secondary | ICD-10-CM | POA: Diagnosis not present

## 2020-08-29 DIAGNOSIS — E1129 Type 2 diabetes mellitus with other diabetic kidney complication: Secondary | ICD-10-CM | POA: Diagnosis not present

## 2020-08-31 DIAGNOSIS — E876 Hypokalemia: Secondary | ICD-10-CM | POA: Diagnosis not present

## 2020-08-31 DIAGNOSIS — Z23 Encounter for immunization: Secondary | ICD-10-CM | POA: Diagnosis not present

## 2020-08-31 DIAGNOSIS — N2581 Secondary hyperparathyroidism of renal origin: Secondary | ICD-10-CM | POA: Diagnosis not present

## 2020-08-31 DIAGNOSIS — D689 Coagulation defect, unspecified: Secondary | ICD-10-CM | POA: Diagnosis not present

## 2020-08-31 DIAGNOSIS — N186 End stage renal disease: Secondary | ICD-10-CM | POA: Diagnosis not present

## 2020-08-31 DIAGNOSIS — E1129 Type 2 diabetes mellitus with other diabetic kidney complication: Secondary | ICD-10-CM | POA: Diagnosis not present

## 2020-08-31 DIAGNOSIS — Z992 Dependence on renal dialysis: Secondary | ICD-10-CM | POA: Diagnosis not present

## 2020-09-02 DIAGNOSIS — E1129 Type 2 diabetes mellitus with other diabetic kidney complication: Secondary | ICD-10-CM | POA: Diagnosis not present

## 2020-09-02 DIAGNOSIS — Z992 Dependence on renal dialysis: Secondary | ICD-10-CM | POA: Diagnosis not present

## 2020-09-02 DIAGNOSIS — N186 End stage renal disease: Secondary | ICD-10-CM | POA: Diagnosis not present

## 2020-09-02 DIAGNOSIS — N2581 Secondary hyperparathyroidism of renal origin: Secondary | ICD-10-CM | POA: Diagnosis not present

## 2020-09-02 DIAGNOSIS — R519 Headache, unspecified: Secondary | ICD-10-CM | POA: Diagnosis not present

## 2020-09-02 DIAGNOSIS — E876 Hypokalemia: Secondary | ICD-10-CM | POA: Diagnosis not present

## 2020-09-02 DIAGNOSIS — D689 Coagulation defect, unspecified: Secondary | ICD-10-CM | POA: Diagnosis not present

## 2020-09-05 DIAGNOSIS — Z992 Dependence on renal dialysis: Secondary | ICD-10-CM | POA: Diagnosis not present

## 2020-09-05 DIAGNOSIS — E1129 Type 2 diabetes mellitus with other diabetic kidney complication: Secondary | ICD-10-CM | POA: Diagnosis not present

## 2020-09-05 DIAGNOSIS — R519 Headache, unspecified: Secondary | ICD-10-CM | POA: Diagnosis not present

## 2020-09-05 DIAGNOSIS — E876 Hypokalemia: Secondary | ICD-10-CM | POA: Diagnosis not present

## 2020-09-05 DIAGNOSIS — D689 Coagulation defect, unspecified: Secondary | ICD-10-CM | POA: Diagnosis not present

## 2020-09-05 DIAGNOSIS — N2581 Secondary hyperparathyroidism of renal origin: Secondary | ICD-10-CM | POA: Diagnosis not present

## 2020-09-05 DIAGNOSIS — N186 End stage renal disease: Secondary | ICD-10-CM | POA: Diagnosis not present

## 2020-09-07 DIAGNOSIS — E1129 Type 2 diabetes mellitus with other diabetic kidney complication: Secondary | ICD-10-CM | POA: Diagnosis not present

## 2020-09-07 DIAGNOSIS — Z992 Dependence on renal dialysis: Secondary | ICD-10-CM | POA: Diagnosis not present

## 2020-09-07 DIAGNOSIS — D689 Coagulation defect, unspecified: Secondary | ICD-10-CM | POA: Diagnosis not present

## 2020-09-07 DIAGNOSIS — N2581 Secondary hyperparathyroidism of renal origin: Secondary | ICD-10-CM | POA: Diagnosis not present

## 2020-09-07 DIAGNOSIS — E876 Hypokalemia: Secondary | ICD-10-CM | POA: Diagnosis not present

## 2020-09-07 DIAGNOSIS — N186 End stage renal disease: Secondary | ICD-10-CM | POA: Diagnosis not present

## 2020-09-07 DIAGNOSIS — R519 Headache, unspecified: Secondary | ICD-10-CM | POA: Diagnosis not present

## 2020-09-09 DIAGNOSIS — D689 Coagulation defect, unspecified: Secondary | ICD-10-CM | POA: Diagnosis not present

## 2020-09-09 DIAGNOSIS — N186 End stage renal disease: Secondary | ICD-10-CM | POA: Diagnosis not present

## 2020-09-09 DIAGNOSIS — Z992 Dependence on renal dialysis: Secondary | ICD-10-CM | POA: Diagnosis not present

## 2020-09-09 DIAGNOSIS — E1129 Type 2 diabetes mellitus with other diabetic kidney complication: Secondary | ICD-10-CM | POA: Diagnosis not present

## 2020-09-09 DIAGNOSIS — E876 Hypokalemia: Secondary | ICD-10-CM | POA: Diagnosis not present

## 2020-09-09 DIAGNOSIS — N2581 Secondary hyperparathyroidism of renal origin: Secondary | ICD-10-CM | POA: Diagnosis not present

## 2020-09-09 DIAGNOSIS — R519 Headache, unspecified: Secondary | ICD-10-CM | POA: Diagnosis not present

## 2020-09-12 DIAGNOSIS — D689 Coagulation defect, unspecified: Secondary | ICD-10-CM | POA: Diagnosis not present

## 2020-09-12 DIAGNOSIS — E1129 Type 2 diabetes mellitus with other diabetic kidney complication: Secondary | ICD-10-CM | POA: Diagnosis not present

## 2020-09-12 DIAGNOSIS — E876 Hypokalemia: Secondary | ICD-10-CM | POA: Diagnosis not present

## 2020-09-12 DIAGNOSIS — Z992 Dependence on renal dialysis: Secondary | ICD-10-CM | POA: Diagnosis not present

## 2020-09-12 DIAGNOSIS — R519 Headache, unspecified: Secondary | ICD-10-CM | POA: Diagnosis not present

## 2020-09-12 DIAGNOSIS — N186 End stage renal disease: Secondary | ICD-10-CM | POA: Diagnosis not present

## 2020-09-12 DIAGNOSIS — N2581 Secondary hyperparathyroidism of renal origin: Secondary | ICD-10-CM | POA: Diagnosis not present

## 2020-09-14 DIAGNOSIS — R519 Headache, unspecified: Secondary | ICD-10-CM | POA: Diagnosis not present

## 2020-09-14 DIAGNOSIS — E876 Hypokalemia: Secondary | ICD-10-CM | POA: Diagnosis not present

## 2020-09-14 DIAGNOSIS — E1129 Type 2 diabetes mellitus with other diabetic kidney complication: Secondary | ICD-10-CM | POA: Diagnosis not present

## 2020-09-14 DIAGNOSIS — Z992 Dependence on renal dialysis: Secondary | ICD-10-CM | POA: Diagnosis not present

## 2020-09-14 DIAGNOSIS — N186 End stage renal disease: Secondary | ICD-10-CM | POA: Diagnosis not present

## 2020-09-14 DIAGNOSIS — D689 Coagulation defect, unspecified: Secondary | ICD-10-CM | POA: Diagnosis not present

## 2020-09-14 DIAGNOSIS — N2581 Secondary hyperparathyroidism of renal origin: Secondary | ICD-10-CM | POA: Diagnosis not present

## 2020-09-16 DIAGNOSIS — R519 Headache, unspecified: Secondary | ICD-10-CM | POA: Diagnosis not present

## 2020-09-16 DIAGNOSIS — E1129 Type 2 diabetes mellitus with other diabetic kidney complication: Secondary | ICD-10-CM | POA: Diagnosis not present

## 2020-09-16 DIAGNOSIS — E876 Hypokalemia: Secondary | ICD-10-CM | POA: Diagnosis not present

## 2020-09-16 DIAGNOSIS — N186 End stage renal disease: Secondary | ICD-10-CM | POA: Diagnosis not present

## 2020-09-16 DIAGNOSIS — N2581 Secondary hyperparathyroidism of renal origin: Secondary | ICD-10-CM | POA: Diagnosis not present

## 2020-09-16 DIAGNOSIS — D689 Coagulation defect, unspecified: Secondary | ICD-10-CM | POA: Diagnosis not present

## 2020-09-16 DIAGNOSIS — Z992 Dependence on renal dialysis: Secondary | ICD-10-CM | POA: Diagnosis not present

## 2020-09-18 ENCOUNTER — Other Ambulatory Visit: Payer: Self-pay | Admitting: Nephrology

## 2020-09-18 DIAGNOSIS — N186 End stage renal disease: Secondary | ICD-10-CM

## 2020-09-18 DIAGNOSIS — N2889 Other specified disorders of kidney and ureter: Secondary | ICD-10-CM

## 2020-09-19 DIAGNOSIS — R519 Headache, unspecified: Secondary | ICD-10-CM | POA: Diagnosis not present

## 2020-09-19 DIAGNOSIS — E1129 Type 2 diabetes mellitus with other diabetic kidney complication: Secondary | ICD-10-CM | POA: Diagnosis not present

## 2020-09-19 DIAGNOSIS — N186 End stage renal disease: Secondary | ICD-10-CM | POA: Diagnosis not present

## 2020-09-19 DIAGNOSIS — E876 Hypokalemia: Secondary | ICD-10-CM | POA: Diagnosis not present

## 2020-09-19 DIAGNOSIS — N2581 Secondary hyperparathyroidism of renal origin: Secondary | ICD-10-CM | POA: Diagnosis not present

## 2020-09-19 DIAGNOSIS — D689 Coagulation defect, unspecified: Secondary | ICD-10-CM | POA: Diagnosis not present

## 2020-09-19 DIAGNOSIS — Z992 Dependence on renal dialysis: Secondary | ICD-10-CM | POA: Diagnosis not present

## 2020-09-20 ENCOUNTER — Encounter: Payer: Medicare Other | Admitting: Nurse Practitioner

## 2020-09-20 NOTE — Progress Notes (Signed)
No show

## 2020-09-21 DIAGNOSIS — E876 Hypokalemia: Secondary | ICD-10-CM | POA: Diagnosis not present

## 2020-09-21 DIAGNOSIS — N2581 Secondary hyperparathyroidism of renal origin: Secondary | ICD-10-CM | POA: Diagnosis not present

## 2020-09-21 DIAGNOSIS — N186 End stage renal disease: Secondary | ICD-10-CM | POA: Diagnosis not present

## 2020-09-21 DIAGNOSIS — D689 Coagulation defect, unspecified: Secondary | ICD-10-CM | POA: Diagnosis not present

## 2020-09-21 DIAGNOSIS — E1129 Type 2 diabetes mellitus with other diabetic kidney complication: Secondary | ICD-10-CM | POA: Diagnosis not present

## 2020-09-21 DIAGNOSIS — R519 Headache, unspecified: Secondary | ICD-10-CM | POA: Diagnosis not present

## 2020-09-21 DIAGNOSIS — Z992 Dependence on renal dialysis: Secondary | ICD-10-CM | POA: Diagnosis not present

## 2020-09-23 DIAGNOSIS — R519 Headache, unspecified: Secondary | ICD-10-CM | POA: Diagnosis not present

## 2020-09-23 DIAGNOSIS — N2581 Secondary hyperparathyroidism of renal origin: Secondary | ICD-10-CM | POA: Diagnosis not present

## 2020-09-23 DIAGNOSIS — E876 Hypokalemia: Secondary | ICD-10-CM | POA: Diagnosis not present

## 2020-09-23 DIAGNOSIS — E1129 Type 2 diabetes mellitus with other diabetic kidney complication: Secondary | ICD-10-CM | POA: Diagnosis not present

## 2020-09-23 DIAGNOSIS — Z992 Dependence on renal dialysis: Secondary | ICD-10-CM | POA: Diagnosis not present

## 2020-09-23 DIAGNOSIS — N186 End stage renal disease: Secondary | ICD-10-CM | POA: Diagnosis not present

## 2020-09-23 DIAGNOSIS — D689 Coagulation defect, unspecified: Secondary | ICD-10-CM | POA: Diagnosis not present

## 2020-09-26 DIAGNOSIS — R519 Headache, unspecified: Secondary | ICD-10-CM | POA: Diagnosis not present

## 2020-09-26 DIAGNOSIS — E876 Hypokalemia: Secondary | ICD-10-CM | POA: Diagnosis not present

## 2020-09-26 DIAGNOSIS — Z992 Dependence on renal dialysis: Secondary | ICD-10-CM | POA: Diagnosis not present

## 2020-09-26 DIAGNOSIS — E1129 Type 2 diabetes mellitus with other diabetic kidney complication: Secondary | ICD-10-CM | POA: Diagnosis not present

## 2020-09-26 DIAGNOSIS — D689 Coagulation defect, unspecified: Secondary | ICD-10-CM | POA: Diagnosis not present

## 2020-09-26 DIAGNOSIS — N186 End stage renal disease: Secondary | ICD-10-CM | POA: Diagnosis not present

## 2020-09-26 DIAGNOSIS — N2581 Secondary hyperparathyroidism of renal origin: Secondary | ICD-10-CM | POA: Diagnosis not present

## 2020-09-28 DIAGNOSIS — N186 End stage renal disease: Secondary | ICD-10-CM | POA: Diagnosis not present

## 2020-09-28 DIAGNOSIS — E876 Hypokalemia: Secondary | ICD-10-CM | POA: Diagnosis not present

## 2020-09-28 DIAGNOSIS — N2581 Secondary hyperparathyroidism of renal origin: Secondary | ICD-10-CM | POA: Diagnosis not present

## 2020-09-28 DIAGNOSIS — R519 Headache, unspecified: Secondary | ICD-10-CM | POA: Diagnosis not present

## 2020-09-28 DIAGNOSIS — E1129 Type 2 diabetes mellitus with other diabetic kidney complication: Secondary | ICD-10-CM | POA: Diagnosis not present

## 2020-09-28 DIAGNOSIS — Z992 Dependence on renal dialysis: Secondary | ICD-10-CM | POA: Diagnosis not present

## 2020-09-28 DIAGNOSIS — D689 Coagulation defect, unspecified: Secondary | ICD-10-CM | POA: Diagnosis not present

## 2020-09-30 DIAGNOSIS — D689 Coagulation defect, unspecified: Secondary | ICD-10-CM | POA: Diagnosis not present

## 2020-09-30 DIAGNOSIS — N186 End stage renal disease: Secondary | ICD-10-CM | POA: Diagnosis not present

## 2020-09-30 DIAGNOSIS — Z992 Dependence on renal dialysis: Secondary | ICD-10-CM | POA: Diagnosis not present

## 2020-09-30 DIAGNOSIS — E876 Hypokalemia: Secondary | ICD-10-CM | POA: Diagnosis not present

## 2020-09-30 DIAGNOSIS — N2581 Secondary hyperparathyroidism of renal origin: Secondary | ICD-10-CM | POA: Diagnosis not present

## 2020-09-30 DIAGNOSIS — E1129 Type 2 diabetes mellitus with other diabetic kidney complication: Secondary | ICD-10-CM | POA: Diagnosis not present

## 2020-09-30 DIAGNOSIS — R519 Headache, unspecified: Secondary | ICD-10-CM | POA: Diagnosis not present

## 2020-10-01 DIAGNOSIS — Z992 Dependence on renal dialysis: Secondary | ICD-10-CM | POA: Diagnosis not present

## 2020-10-01 DIAGNOSIS — E1129 Type 2 diabetes mellitus with other diabetic kidney complication: Secondary | ICD-10-CM | POA: Diagnosis not present

## 2020-10-01 DIAGNOSIS — N186 End stage renal disease: Secondary | ICD-10-CM | POA: Diagnosis not present

## 2020-10-02 ENCOUNTER — Other Ambulatory Visit: Payer: Medicare Other

## 2020-10-03 DIAGNOSIS — N2581 Secondary hyperparathyroidism of renal origin: Secondary | ICD-10-CM | POA: Diagnosis not present

## 2020-10-03 DIAGNOSIS — N186 End stage renal disease: Secondary | ICD-10-CM | POA: Diagnosis not present

## 2020-10-03 DIAGNOSIS — R519 Headache, unspecified: Secondary | ICD-10-CM | POA: Diagnosis not present

## 2020-10-03 DIAGNOSIS — Z992 Dependence on renal dialysis: Secondary | ICD-10-CM | POA: Diagnosis not present

## 2020-10-03 DIAGNOSIS — D689 Coagulation defect, unspecified: Secondary | ICD-10-CM | POA: Diagnosis not present

## 2020-10-05 DIAGNOSIS — Z992 Dependence on renal dialysis: Secondary | ICD-10-CM | POA: Diagnosis not present

## 2020-10-05 DIAGNOSIS — R519 Headache, unspecified: Secondary | ICD-10-CM | POA: Diagnosis not present

## 2020-10-05 DIAGNOSIS — D689 Coagulation defect, unspecified: Secondary | ICD-10-CM | POA: Diagnosis not present

## 2020-10-05 DIAGNOSIS — N2581 Secondary hyperparathyroidism of renal origin: Secondary | ICD-10-CM | POA: Diagnosis not present

## 2020-10-05 DIAGNOSIS — N186 End stage renal disease: Secondary | ICD-10-CM | POA: Diagnosis not present

## 2020-10-07 DIAGNOSIS — D689 Coagulation defect, unspecified: Secondary | ICD-10-CM | POA: Diagnosis not present

## 2020-10-07 DIAGNOSIS — Z992 Dependence on renal dialysis: Secondary | ICD-10-CM | POA: Diagnosis not present

## 2020-10-07 DIAGNOSIS — N2581 Secondary hyperparathyroidism of renal origin: Secondary | ICD-10-CM | POA: Diagnosis not present

## 2020-10-07 DIAGNOSIS — R519 Headache, unspecified: Secondary | ICD-10-CM | POA: Diagnosis not present

## 2020-10-07 DIAGNOSIS — N186 End stage renal disease: Secondary | ICD-10-CM | POA: Diagnosis not present

## 2020-10-08 ENCOUNTER — Other Ambulatory Visit: Payer: Self-pay | Admitting: Nurse Practitioner

## 2020-10-09 ENCOUNTER — Ambulatory Visit
Admission: RE | Admit: 2020-10-09 | Discharge: 2020-10-09 | Disposition: A | Discharge status: Home / Self Care | Payer: Medicare Other | Source: Ambulatory Visit | Attending: Nephrology | Admitting: Nephrology

## 2020-10-09 DIAGNOSIS — N186 End stage renal disease: Secondary | ICD-10-CM | POA: Diagnosis not present

## 2020-10-09 DIAGNOSIS — N281 Cyst of kidney, acquired: Secondary | ICD-10-CM | POA: Diagnosis not present

## 2020-10-09 DIAGNOSIS — N2889 Other specified disorders of kidney and ureter: Secondary | ICD-10-CM

## 2020-10-10 DIAGNOSIS — R519 Headache, unspecified: Secondary | ICD-10-CM | POA: Diagnosis not present

## 2020-10-10 DIAGNOSIS — Z992 Dependence on renal dialysis: Secondary | ICD-10-CM | POA: Diagnosis not present

## 2020-10-10 DIAGNOSIS — D689 Coagulation defect, unspecified: Secondary | ICD-10-CM | POA: Diagnosis not present

## 2020-10-10 DIAGNOSIS — N2581 Secondary hyperparathyroidism of renal origin: Secondary | ICD-10-CM | POA: Diagnosis not present

## 2020-10-10 DIAGNOSIS — N186 End stage renal disease: Secondary | ICD-10-CM | POA: Diagnosis not present

## 2020-10-11 DIAGNOSIS — Z992 Dependence on renal dialysis: Secondary | ICD-10-CM | POA: Diagnosis not present

## 2020-10-11 DIAGNOSIS — N186 End stage renal disease: Secondary | ICD-10-CM | POA: Diagnosis not present

## 2020-10-11 DIAGNOSIS — I871 Compression of vein: Secondary | ICD-10-CM | POA: Diagnosis not present

## 2020-10-12 DIAGNOSIS — N186 End stage renal disease: Secondary | ICD-10-CM | POA: Diagnosis not present

## 2020-10-12 DIAGNOSIS — D689 Coagulation defect, unspecified: Secondary | ICD-10-CM | POA: Diagnosis not present

## 2020-10-12 DIAGNOSIS — R519 Headache, unspecified: Secondary | ICD-10-CM | POA: Diagnosis not present

## 2020-10-12 DIAGNOSIS — N2581 Secondary hyperparathyroidism of renal origin: Secondary | ICD-10-CM | POA: Diagnosis not present

## 2020-10-12 DIAGNOSIS — Z992 Dependence on renal dialysis: Secondary | ICD-10-CM | POA: Diagnosis not present

## 2020-10-14 DIAGNOSIS — N2581 Secondary hyperparathyroidism of renal origin: Secondary | ICD-10-CM | POA: Diagnosis not present

## 2020-10-14 DIAGNOSIS — Z992 Dependence on renal dialysis: Secondary | ICD-10-CM | POA: Diagnosis not present

## 2020-10-14 DIAGNOSIS — D689 Coagulation defect, unspecified: Secondary | ICD-10-CM | POA: Diagnosis not present

## 2020-10-14 DIAGNOSIS — R519 Headache, unspecified: Secondary | ICD-10-CM | POA: Diagnosis not present

## 2020-10-14 DIAGNOSIS — N186 End stage renal disease: Secondary | ICD-10-CM | POA: Diagnosis not present

## 2020-10-17 DIAGNOSIS — N186 End stage renal disease: Secondary | ICD-10-CM | POA: Diagnosis not present

## 2020-10-17 DIAGNOSIS — D689 Coagulation defect, unspecified: Secondary | ICD-10-CM | POA: Diagnosis not present

## 2020-10-17 DIAGNOSIS — Z992 Dependence on renal dialysis: Secondary | ICD-10-CM | POA: Diagnosis not present

## 2020-10-17 DIAGNOSIS — R519 Headache, unspecified: Secondary | ICD-10-CM | POA: Diagnosis not present

## 2020-10-17 DIAGNOSIS — N2581 Secondary hyperparathyroidism of renal origin: Secondary | ICD-10-CM | POA: Diagnosis not present

## 2020-10-19 DIAGNOSIS — Z992 Dependence on renal dialysis: Secondary | ICD-10-CM | POA: Diagnosis not present

## 2020-10-19 DIAGNOSIS — N186 End stage renal disease: Secondary | ICD-10-CM | POA: Diagnosis not present

## 2020-10-19 DIAGNOSIS — R519 Headache, unspecified: Secondary | ICD-10-CM | POA: Diagnosis not present

## 2020-10-19 DIAGNOSIS — D689 Coagulation defect, unspecified: Secondary | ICD-10-CM | POA: Diagnosis not present

## 2020-10-19 DIAGNOSIS — N2581 Secondary hyperparathyroidism of renal origin: Secondary | ICD-10-CM | POA: Diagnosis not present

## 2020-10-21 DIAGNOSIS — N186 End stage renal disease: Secondary | ICD-10-CM | POA: Diagnosis not present

## 2020-10-21 DIAGNOSIS — Z992 Dependence on renal dialysis: Secondary | ICD-10-CM | POA: Diagnosis not present

## 2020-10-21 DIAGNOSIS — N2581 Secondary hyperparathyroidism of renal origin: Secondary | ICD-10-CM | POA: Diagnosis not present

## 2020-10-21 DIAGNOSIS — D689 Coagulation defect, unspecified: Secondary | ICD-10-CM | POA: Diagnosis not present

## 2020-10-21 DIAGNOSIS — R519 Headache, unspecified: Secondary | ICD-10-CM | POA: Diagnosis not present

## 2020-10-23 DIAGNOSIS — D689 Coagulation defect, unspecified: Secondary | ICD-10-CM | POA: Diagnosis not present

## 2020-10-23 DIAGNOSIS — N2581 Secondary hyperparathyroidism of renal origin: Secondary | ICD-10-CM | POA: Diagnosis not present

## 2020-10-23 DIAGNOSIS — Z992 Dependence on renal dialysis: Secondary | ICD-10-CM | POA: Diagnosis not present

## 2020-10-23 DIAGNOSIS — R519 Headache, unspecified: Secondary | ICD-10-CM | POA: Diagnosis not present

## 2020-10-23 DIAGNOSIS — N186 End stage renal disease: Secondary | ICD-10-CM | POA: Diagnosis not present

## 2020-10-25 DIAGNOSIS — Z992 Dependence on renal dialysis: Secondary | ICD-10-CM | POA: Diagnosis not present

## 2020-10-25 DIAGNOSIS — N2581 Secondary hyperparathyroidism of renal origin: Secondary | ICD-10-CM | POA: Diagnosis not present

## 2020-10-25 DIAGNOSIS — N186 End stage renal disease: Secondary | ICD-10-CM | POA: Diagnosis not present

## 2020-10-25 DIAGNOSIS — D689 Coagulation defect, unspecified: Secondary | ICD-10-CM | POA: Diagnosis not present

## 2020-10-25 DIAGNOSIS — R519 Headache, unspecified: Secondary | ICD-10-CM | POA: Diagnosis not present

## 2020-10-28 DIAGNOSIS — Z992 Dependence on renal dialysis: Secondary | ICD-10-CM | POA: Diagnosis not present

## 2020-10-28 DIAGNOSIS — R519 Headache, unspecified: Secondary | ICD-10-CM | POA: Diagnosis not present

## 2020-10-28 DIAGNOSIS — N186 End stage renal disease: Secondary | ICD-10-CM | POA: Diagnosis not present

## 2020-10-28 DIAGNOSIS — N2581 Secondary hyperparathyroidism of renal origin: Secondary | ICD-10-CM | POA: Diagnosis not present

## 2020-10-28 DIAGNOSIS — D689 Coagulation defect, unspecified: Secondary | ICD-10-CM | POA: Diagnosis not present

## 2020-10-31 DIAGNOSIS — N186 End stage renal disease: Secondary | ICD-10-CM | POA: Diagnosis not present

## 2020-10-31 DIAGNOSIS — E1129 Type 2 diabetes mellitus with other diabetic kidney complication: Secondary | ICD-10-CM | POA: Diagnosis not present

## 2020-10-31 DIAGNOSIS — Z992 Dependence on renal dialysis: Secondary | ICD-10-CM | POA: Diagnosis not present

## 2020-10-31 DIAGNOSIS — N2581 Secondary hyperparathyroidism of renal origin: Secondary | ICD-10-CM | POA: Diagnosis not present

## 2020-10-31 DIAGNOSIS — R519 Headache, unspecified: Secondary | ICD-10-CM | POA: Diagnosis not present

## 2020-10-31 DIAGNOSIS — D689 Coagulation defect, unspecified: Secondary | ICD-10-CM | POA: Diagnosis not present

## 2020-11-02 DIAGNOSIS — N2581 Secondary hyperparathyroidism of renal origin: Secondary | ICD-10-CM | POA: Diagnosis not present

## 2020-11-02 DIAGNOSIS — R519 Headache, unspecified: Secondary | ICD-10-CM | POA: Diagnosis not present

## 2020-11-02 DIAGNOSIS — N186 End stage renal disease: Secondary | ICD-10-CM | POA: Diagnosis not present

## 2020-11-02 DIAGNOSIS — D689 Coagulation defect, unspecified: Secondary | ICD-10-CM | POA: Diagnosis not present

## 2020-11-02 DIAGNOSIS — Z992 Dependence on renal dialysis: Secondary | ICD-10-CM | POA: Diagnosis not present

## 2020-11-04 DIAGNOSIS — D689 Coagulation defect, unspecified: Secondary | ICD-10-CM | POA: Diagnosis not present

## 2020-11-04 DIAGNOSIS — N186 End stage renal disease: Secondary | ICD-10-CM | POA: Diagnosis not present

## 2020-11-04 DIAGNOSIS — N2581 Secondary hyperparathyroidism of renal origin: Secondary | ICD-10-CM | POA: Diagnosis not present

## 2020-11-04 DIAGNOSIS — Z992 Dependence on renal dialysis: Secondary | ICD-10-CM | POA: Diagnosis not present

## 2020-11-04 DIAGNOSIS — R519 Headache, unspecified: Secondary | ICD-10-CM | POA: Diagnosis not present

## 2020-11-07 DIAGNOSIS — N2581 Secondary hyperparathyroidism of renal origin: Secondary | ICD-10-CM | POA: Diagnosis not present

## 2020-11-07 DIAGNOSIS — R519 Headache, unspecified: Secondary | ICD-10-CM | POA: Diagnosis not present

## 2020-11-07 DIAGNOSIS — D689 Coagulation defect, unspecified: Secondary | ICD-10-CM | POA: Diagnosis not present

## 2020-11-07 DIAGNOSIS — N186 End stage renal disease: Secondary | ICD-10-CM | POA: Diagnosis not present

## 2020-11-07 DIAGNOSIS — Z992 Dependence on renal dialysis: Secondary | ICD-10-CM | POA: Diagnosis not present

## 2020-11-09 DIAGNOSIS — Z992 Dependence on renal dialysis: Secondary | ICD-10-CM | POA: Diagnosis not present

## 2020-11-09 DIAGNOSIS — N2581 Secondary hyperparathyroidism of renal origin: Secondary | ICD-10-CM | POA: Diagnosis not present

## 2020-11-09 DIAGNOSIS — N186 End stage renal disease: Secondary | ICD-10-CM | POA: Diagnosis not present

## 2020-11-09 DIAGNOSIS — R519 Headache, unspecified: Secondary | ICD-10-CM | POA: Diagnosis not present

## 2020-11-09 DIAGNOSIS — D689 Coagulation defect, unspecified: Secondary | ICD-10-CM | POA: Diagnosis not present

## 2020-11-11 DIAGNOSIS — N2581 Secondary hyperparathyroidism of renal origin: Secondary | ICD-10-CM | POA: Diagnosis not present

## 2020-11-11 DIAGNOSIS — Z992 Dependence on renal dialysis: Secondary | ICD-10-CM | POA: Diagnosis not present

## 2020-11-11 DIAGNOSIS — D689 Coagulation defect, unspecified: Secondary | ICD-10-CM | POA: Diagnosis not present

## 2020-11-11 DIAGNOSIS — N186 End stage renal disease: Secondary | ICD-10-CM | POA: Diagnosis not present

## 2020-11-11 DIAGNOSIS — R519 Headache, unspecified: Secondary | ICD-10-CM | POA: Diagnosis not present

## 2020-11-14 DIAGNOSIS — D689 Coagulation defect, unspecified: Secondary | ICD-10-CM | POA: Diagnosis not present

## 2020-11-14 DIAGNOSIS — Z992 Dependence on renal dialysis: Secondary | ICD-10-CM | POA: Diagnosis not present

## 2020-11-14 DIAGNOSIS — R519 Headache, unspecified: Secondary | ICD-10-CM | POA: Diagnosis not present

## 2020-11-14 DIAGNOSIS — N2581 Secondary hyperparathyroidism of renal origin: Secondary | ICD-10-CM | POA: Diagnosis not present

## 2020-11-14 DIAGNOSIS — N186 End stage renal disease: Secondary | ICD-10-CM | POA: Diagnosis not present

## 2020-11-16 DIAGNOSIS — Z992 Dependence on renal dialysis: Secondary | ICD-10-CM | POA: Diagnosis not present

## 2020-11-16 DIAGNOSIS — D689 Coagulation defect, unspecified: Secondary | ICD-10-CM | POA: Diagnosis not present

## 2020-11-16 DIAGNOSIS — N2581 Secondary hyperparathyroidism of renal origin: Secondary | ICD-10-CM | POA: Diagnosis not present

## 2020-11-16 DIAGNOSIS — N186 End stage renal disease: Secondary | ICD-10-CM | POA: Diagnosis not present

## 2020-11-16 DIAGNOSIS — R519 Headache, unspecified: Secondary | ICD-10-CM | POA: Diagnosis not present

## 2020-11-18 DIAGNOSIS — N2581 Secondary hyperparathyroidism of renal origin: Secondary | ICD-10-CM | POA: Diagnosis not present

## 2020-11-18 DIAGNOSIS — R519 Headache, unspecified: Secondary | ICD-10-CM | POA: Diagnosis not present

## 2020-11-18 DIAGNOSIS — Z992 Dependence on renal dialysis: Secondary | ICD-10-CM | POA: Diagnosis not present

## 2020-11-18 DIAGNOSIS — D689 Coagulation defect, unspecified: Secondary | ICD-10-CM | POA: Diagnosis not present

## 2020-11-18 DIAGNOSIS — N186 End stage renal disease: Secondary | ICD-10-CM | POA: Diagnosis not present

## 2020-11-21 DIAGNOSIS — N186 End stage renal disease: Secondary | ICD-10-CM | POA: Diagnosis not present

## 2020-11-21 DIAGNOSIS — Z992 Dependence on renal dialysis: Secondary | ICD-10-CM | POA: Diagnosis not present

## 2020-11-21 DIAGNOSIS — D689 Coagulation defect, unspecified: Secondary | ICD-10-CM | POA: Diagnosis not present

## 2020-11-21 DIAGNOSIS — N2581 Secondary hyperparathyroidism of renal origin: Secondary | ICD-10-CM | POA: Diagnosis not present

## 2020-11-21 DIAGNOSIS — R519 Headache, unspecified: Secondary | ICD-10-CM | POA: Diagnosis not present

## 2020-11-22 ENCOUNTER — Other Ambulatory Visit: Payer: Self-pay | Admitting: Cardiology

## 2020-11-23 DIAGNOSIS — Z992 Dependence on renal dialysis: Secondary | ICD-10-CM | POA: Diagnosis not present

## 2020-11-23 DIAGNOSIS — D689 Coagulation defect, unspecified: Secondary | ICD-10-CM | POA: Diagnosis not present

## 2020-11-23 DIAGNOSIS — N2581 Secondary hyperparathyroidism of renal origin: Secondary | ICD-10-CM | POA: Diagnosis not present

## 2020-11-23 DIAGNOSIS — R519 Headache, unspecified: Secondary | ICD-10-CM | POA: Diagnosis not present

## 2020-11-23 DIAGNOSIS — N186 End stage renal disease: Secondary | ICD-10-CM | POA: Diagnosis not present

## 2020-11-26 DIAGNOSIS — N2581 Secondary hyperparathyroidism of renal origin: Secondary | ICD-10-CM | POA: Diagnosis not present

## 2020-11-26 DIAGNOSIS — N186 End stage renal disease: Secondary | ICD-10-CM | POA: Diagnosis not present

## 2020-11-26 DIAGNOSIS — R519 Headache, unspecified: Secondary | ICD-10-CM | POA: Diagnosis not present

## 2020-11-26 DIAGNOSIS — Z992 Dependence on renal dialysis: Secondary | ICD-10-CM | POA: Diagnosis not present

## 2020-11-26 DIAGNOSIS — D689 Coagulation defect, unspecified: Secondary | ICD-10-CM | POA: Diagnosis not present

## 2020-11-28 DIAGNOSIS — Z992 Dependence on renal dialysis: Secondary | ICD-10-CM | POA: Diagnosis not present

## 2020-11-28 DIAGNOSIS — N2581 Secondary hyperparathyroidism of renal origin: Secondary | ICD-10-CM | POA: Diagnosis not present

## 2020-11-28 DIAGNOSIS — R519 Headache, unspecified: Secondary | ICD-10-CM | POA: Diagnosis not present

## 2020-11-28 DIAGNOSIS — D689 Coagulation defect, unspecified: Secondary | ICD-10-CM | POA: Diagnosis not present

## 2020-11-28 DIAGNOSIS — N186 End stage renal disease: Secondary | ICD-10-CM | POA: Diagnosis not present

## 2020-11-30 DIAGNOSIS — N2581 Secondary hyperparathyroidism of renal origin: Secondary | ICD-10-CM | POA: Diagnosis not present

## 2020-11-30 DIAGNOSIS — R519 Headache, unspecified: Secondary | ICD-10-CM | POA: Diagnosis not present

## 2020-11-30 DIAGNOSIS — N186 End stage renal disease: Secondary | ICD-10-CM | POA: Diagnosis not present

## 2020-11-30 DIAGNOSIS — D689 Coagulation defect, unspecified: Secondary | ICD-10-CM | POA: Diagnosis not present

## 2020-11-30 DIAGNOSIS — Z992 Dependence on renal dialysis: Secondary | ICD-10-CM | POA: Diagnosis not present

## 2020-12-01 DIAGNOSIS — N186 End stage renal disease: Secondary | ICD-10-CM | POA: Diagnosis not present

## 2020-12-01 DIAGNOSIS — E1129 Type 2 diabetes mellitus with other diabetic kidney complication: Secondary | ICD-10-CM | POA: Diagnosis not present

## 2020-12-01 DIAGNOSIS — Z992 Dependence on renal dialysis: Secondary | ICD-10-CM | POA: Diagnosis not present

## 2020-12-03 DIAGNOSIS — N186 End stage renal disease: Secondary | ICD-10-CM | POA: Diagnosis not present

## 2020-12-03 DIAGNOSIS — D689 Coagulation defect, unspecified: Secondary | ICD-10-CM | POA: Diagnosis not present

## 2020-12-03 DIAGNOSIS — Z992 Dependence on renal dialysis: Secondary | ICD-10-CM | POA: Diagnosis not present

## 2020-12-03 DIAGNOSIS — R519 Headache, unspecified: Secondary | ICD-10-CM | POA: Diagnosis not present

## 2020-12-03 DIAGNOSIS — N2581 Secondary hyperparathyroidism of renal origin: Secondary | ICD-10-CM | POA: Diagnosis not present

## 2020-12-03 DIAGNOSIS — E1129 Type 2 diabetes mellitus with other diabetic kidney complication: Secondary | ICD-10-CM | POA: Diagnosis not present

## 2020-12-05 DIAGNOSIS — Z992 Dependence on renal dialysis: Secondary | ICD-10-CM | POA: Diagnosis not present

## 2020-12-05 DIAGNOSIS — N186 End stage renal disease: Secondary | ICD-10-CM | POA: Diagnosis not present

## 2020-12-05 DIAGNOSIS — D689 Coagulation defect, unspecified: Secondary | ICD-10-CM | POA: Diagnosis not present

## 2020-12-05 DIAGNOSIS — N2581 Secondary hyperparathyroidism of renal origin: Secondary | ICD-10-CM | POA: Diagnosis not present

## 2020-12-05 DIAGNOSIS — R519 Headache, unspecified: Secondary | ICD-10-CM | POA: Diagnosis not present

## 2020-12-05 DIAGNOSIS — E1129 Type 2 diabetes mellitus with other diabetic kidney complication: Secondary | ICD-10-CM | POA: Diagnosis not present

## 2020-12-07 ENCOUNTER — Other Ambulatory Visit: Payer: Self-pay | Admitting: Cardiology

## 2020-12-07 DIAGNOSIS — E1129 Type 2 diabetes mellitus with other diabetic kidney complication: Secondary | ICD-10-CM | POA: Diagnosis not present

## 2020-12-07 DIAGNOSIS — N186 End stage renal disease: Secondary | ICD-10-CM | POA: Diagnosis not present

## 2020-12-07 DIAGNOSIS — R519 Headache, unspecified: Secondary | ICD-10-CM | POA: Diagnosis not present

## 2020-12-07 DIAGNOSIS — Z992 Dependence on renal dialysis: Secondary | ICD-10-CM | POA: Diagnosis not present

## 2020-12-07 DIAGNOSIS — N2581 Secondary hyperparathyroidism of renal origin: Secondary | ICD-10-CM | POA: Diagnosis not present

## 2020-12-07 DIAGNOSIS — D689 Coagulation defect, unspecified: Secondary | ICD-10-CM | POA: Diagnosis not present

## 2020-12-09 DIAGNOSIS — D689 Coagulation defect, unspecified: Secondary | ICD-10-CM | POA: Diagnosis not present

## 2020-12-09 DIAGNOSIS — E1129 Type 2 diabetes mellitus with other diabetic kidney complication: Secondary | ICD-10-CM | POA: Diagnosis not present

## 2020-12-09 DIAGNOSIS — R519 Headache, unspecified: Secondary | ICD-10-CM | POA: Diagnosis not present

## 2020-12-09 DIAGNOSIS — N2581 Secondary hyperparathyroidism of renal origin: Secondary | ICD-10-CM | POA: Diagnosis not present

## 2020-12-09 DIAGNOSIS — Z992 Dependence on renal dialysis: Secondary | ICD-10-CM | POA: Diagnosis not present

## 2020-12-09 DIAGNOSIS — N186 End stage renal disease: Secondary | ICD-10-CM | POA: Diagnosis not present

## 2020-12-12 DIAGNOSIS — N186 End stage renal disease: Secondary | ICD-10-CM | POA: Diagnosis not present

## 2020-12-12 DIAGNOSIS — E1129 Type 2 diabetes mellitus with other diabetic kidney complication: Secondary | ICD-10-CM | POA: Diagnosis not present

## 2020-12-12 DIAGNOSIS — R519 Headache, unspecified: Secondary | ICD-10-CM | POA: Diagnosis not present

## 2020-12-12 DIAGNOSIS — N2581 Secondary hyperparathyroidism of renal origin: Secondary | ICD-10-CM | POA: Diagnosis not present

## 2020-12-12 DIAGNOSIS — Z992 Dependence on renal dialysis: Secondary | ICD-10-CM | POA: Diagnosis not present

## 2020-12-12 DIAGNOSIS — D689 Coagulation defect, unspecified: Secondary | ICD-10-CM | POA: Diagnosis not present

## 2020-12-14 DIAGNOSIS — R519 Headache, unspecified: Secondary | ICD-10-CM | POA: Diagnosis not present

## 2020-12-14 DIAGNOSIS — N2581 Secondary hyperparathyroidism of renal origin: Secondary | ICD-10-CM | POA: Diagnosis not present

## 2020-12-14 DIAGNOSIS — N186 End stage renal disease: Secondary | ICD-10-CM | POA: Diagnosis not present

## 2020-12-14 DIAGNOSIS — E1129 Type 2 diabetes mellitus with other diabetic kidney complication: Secondary | ICD-10-CM | POA: Diagnosis not present

## 2020-12-14 DIAGNOSIS — D689 Coagulation defect, unspecified: Secondary | ICD-10-CM | POA: Diagnosis not present

## 2020-12-14 DIAGNOSIS — Z992 Dependence on renal dialysis: Secondary | ICD-10-CM | POA: Diagnosis not present

## 2020-12-16 DIAGNOSIS — Z992 Dependence on renal dialysis: Secondary | ICD-10-CM | POA: Diagnosis not present

## 2020-12-16 DIAGNOSIS — N186 End stage renal disease: Secondary | ICD-10-CM | POA: Diagnosis not present

## 2020-12-16 DIAGNOSIS — D689 Coagulation defect, unspecified: Secondary | ICD-10-CM | POA: Diagnosis not present

## 2020-12-16 DIAGNOSIS — R519 Headache, unspecified: Secondary | ICD-10-CM | POA: Diagnosis not present

## 2020-12-16 DIAGNOSIS — E1129 Type 2 diabetes mellitus with other diabetic kidney complication: Secondary | ICD-10-CM | POA: Diagnosis not present

## 2020-12-16 DIAGNOSIS — N2581 Secondary hyperparathyroidism of renal origin: Secondary | ICD-10-CM | POA: Diagnosis not present

## 2020-12-19 DIAGNOSIS — N186 End stage renal disease: Secondary | ICD-10-CM | POA: Diagnosis not present

## 2020-12-19 DIAGNOSIS — R519 Headache, unspecified: Secondary | ICD-10-CM | POA: Diagnosis not present

## 2020-12-19 DIAGNOSIS — E1129 Type 2 diabetes mellitus with other diabetic kidney complication: Secondary | ICD-10-CM | POA: Diagnosis not present

## 2020-12-19 DIAGNOSIS — N2581 Secondary hyperparathyroidism of renal origin: Secondary | ICD-10-CM | POA: Diagnosis not present

## 2020-12-19 DIAGNOSIS — Z992 Dependence on renal dialysis: Secondary | ICD-10-CM | POA: Diagnosis not present

## 2020-12-19 DIAGNOSIS — D689 Coagulation defect, unspecified: Secondary | ICD-10-CM | POA: Diagnosis not present

## 2020-12-21 DIAGNOSIS — R519 Headache, unspecified: Secondary | ICD-10-CM | POA: Diagnosis not present

## 2020-12-21 DIAGNOSIS — D689 Coagulation defect, unspecified: Secondary | ICD-10-CM | POA: Diagnosis not present

## 2020-12-21 DIAGNOSIS — N186 End stage renal disease: Secondary | ICD-10-CM | POA: Diagnosis not present

## 2020-12-21 DIAGNOSIS — Z992 Dependence on renal dialysis: Secondary | ICD-10-CM | POA: Diagnosis not present

## 2020-12-21 DIAGNOSIS — E1129 Type 2 diabetes mellitus with other diabetic kidney complication: Secondary | ICD-10-CM | POA: Diagnosis not present

## 2020-12-21 DIAGNOSIS — N2581 Secondary hyperparathyroidism of renal origin: Secondary | ICD-10-CM | POA: Diagnosis not present

## 2020-12-26 DIAGNOSIS — N186 End stage renal disease: Secondary | ICD-10-CM | POA: Diagnosis not present

## 2020-12-26 DIAGNOSIS — N2581 Secondary hyperparathyroidism of renal origin: Secondary | ICD-10-CM | POA: Diagnosis not present

## 2020-12-26 DIAGNOSIS — R519 Headache, unspecified: Secondary | ICD-10-CM | POA: Diagnosis not present

## 2020-12-26 DIAGNOSIS — Z992 Dependence on renal dialysis: Secondary | ICD-10-CM | POA: Diagnosis not present

## 2020-12-26 DIAGNOSIS — E1129 Type 2 diabetes mellitus with other diabetic kidney complication: Secondary | ICD-10-CM | POA: Diagnosis not present

## 2020-12-26 DIAGNOSIS — D689 Coagulation defect, unspecified: Secondary | ICD-10-CM | POA: Diagnosis not present

## 2020-12-27 DIAGNOSIS — Z1152 Encounter for screening for COVID-19: Secondary | ICD-10-CM | POA: Diagnosis not present

## 2020-12-27 DIAGNOSIS — Z20822 Contact with and (suspected) exposure to covid-19: Secondary | ICD-10-CM | POA: Diagnosis not present

## 2020-12-28 DIAGNOSIS — N2581 Secondary hyperparathyroidism of renal origin: Secondary | ICD-10-CM | POA: Diagnosis not present

## 2020-12-28 DIAGNOSIS — Z992 Dependence on renal dialysis: Secondary | ICD-10-CM | POA: Diagnosis not present

## 2020-12-28 DIAGNOSIS — E1129 Type 2 diabetes mellitus with other diabetic kidney complication: Secondary | ICD-10-CM | POA: Diagnosis not present

## 2020-12-28 DIAGNOSIS — N186 End stage renal disease: Secondary | ICD-10-CM | POA: Diagnosis not present

## 2020-12-28 DIAGNOSIS — D689 Coagulation defect, unspecified: Secondary | ICD-10-CM | POA: Diagnosis not present

## 2020-12-28 DIAGNOSIS — R519 Headache, unspecified: Secondary | ICD-10-CM | POA: Diagnosis not present

## 2021-01-01 DIAGNOSIS — Z992 Dependence on renal dialysis: Secondary | ICD-10-CM | POA: Diagnosis not present

## 2021-01-01 DIAGNOSIS — N186 End stage renal disease: Secondary | ICD-10-CM | POA: Diagnosis not present

## 2021-01-01 DIAGNOSIS — E1129 Type 2 diabetes mellitus with other diabetic kidney complication: Secondary | ICD-10-CM | POA: Diagnosis not present

## 2021-01-02 ENCOUNTER — Telehealth: Payer: Self-pay | Admitting: Nurse Practitioner

## 2021-01-02 DIAGNOSIS — N186 End stage renal disease: Secondary | ICD-10-CM | POA: Diagnosis not present

## 2021-01-02 DIAGNOSIS — Z23 Encounter for immunization: Secondary | ICD-10-CM | POA: Diagnosis not present

## 2021-01-02 DIAGNOSIS — Z283 Underimmunization status: Secondary | ICD-10-CM | POA: Diagnosis not present

## 2021-01-02 DIAGNOSIS — N2581 Secondary hyperparathyroidism of renal origin: Secondary | ICD-10-CM | POA: Diagnosis not present

## 2021-01-02 DIAGNOSIS — D689 Coagulation defect, unspecified: Secondary | ICD-10-CM | POA: Diagnosis not present

## 2021-01-02 DIAGNOSIS — Z992 Dependence on renal dialysis: Secondary | ICD-10-CM | POA: Diagnosis not present

## 2021-01-02 DIAGNOSIS — R519 Headache, unspecified: Secondary | ICD-10-CM | POA: Diagnosis not present

## 2021-01-02 NOTE — Telephone Encounter (Signed)
Tried calling pt to see if we can move awvs up to a sooner appointment Calendar year uhc  Last awvs 06/07/20

## 2021-01-04 DIAGNOSIS — R519 Headache, unspecified: Secondary | ICD-10-CM | POA: Diagnosis not present

## 2021-01-04 DIAGNOSIS — Z23 Encounter for immunization: Secondary | ICD-10-CM | POA: Diagnosis not present

## 2021-01-04 DIAGNOSIS — Z992 Dependence on renal dialysis: Secondary | ICD-10-CM | POA: Diagnosis not present

## 2021-01-04 DIAGNOSIS — Z283 Underimmunization status: Secondary | ICD-10-CM | POA: Diagnosis not present

## 2021-01-04 DIAGNOSIS — N186 End stage renal disease: Secondary | ICD-10-CM | POA: Diagnosis not present

## 2021-01-04 DIAGNOSIS — D689 Coagulation defect, unspecified: Secondary | ICD-10-CM | POA: Diagnosis not present

## 2021-01-04 DIAGNOSIS — N2581 Secondary hyperparathyroidism of renal origin: Secondary | ICD-10-CM | POA: Diagnosis not present

## 2021-01-06 DIAGNOSIS — Z992 Dependence on renal dialysis: Secondary | ICD-10-CM | POA: Diagnosis not present

## 2021-01-06 DIAGNOSIS — N2581 Secondary hyperparathyroidism of renal origin: Secondary | ICD-10-CM | POA: Diagnosis not present

## 2021-01-06 DIAGNOSIS — Z23 Encounter for immunization: Secondary | ICD-10-CM | POA: Diagnosis not present

## 2021-01-06 DIAGNOSIS — R519 Headache, unspecified: Secondary | ICD-10-CM | POA: Diagnosis not present

## 2021-01-06 DIAGNOSIS — D689 Coagulation defect, unspecified: Secondary | ICD-10-CM | POA: Diagnosis not present

## 2021-01-06 DIAGNOSIS — N186 End stage renal disease: Secondary | ICD-10-CM | POA: Diagnosis not present

## 2021-01-06 DIAGNOSIS — Z283 Underimmunization status: Secondary | ICD-10-CM | POA: Diagnosis not present

## 2021-01-09 DIAGNOSIS — D689 Coagulation defect, unspecified: Secondary | ICD-10-CM | POA: Diagnosis not present

## 2021-01-09 DIAGNOSIS — Z283 Underimmunization status: Secondary | ICD-10-CM | POA: Diagnosis not present

## 2021-01-09 DIAGNOSIS — N186 End stage renal disease: Secondary | ICD-10-CM | POA: Diagnosis not present

## 2021-01-09 DIAGNOSIS — Z992 Dependence on renal dialysis: Secondary | ICD-10-CM | POA: Diagnosis not present

## 2021-01-09 DIAGNOSIS — Z23 Encounter for immunization: Secondary | ICD-10-CM | POA: Diagnosis not present

## 2021-01-09 DIAGNOSIS — R519 Headache, unspecified: Secondary | ICD-10-CM | POA: Diagnosis not present

## 2021-01-09 DIAGNOSIS — N2581 Secondary hyperparathyroidism of renal origin: Secondary | ICD-10-CM | POA: Diagnosis not present

## 2021-01-11 DIAGNOSIS — R519 Headache, unspecified: Secondary | ICD-10-CM | POA: Diagnosis not present

## 2021-01-11 DIAGNOSIS — N2581 Secondary hyperparathyroidism of renal origin: Secondary | ICD-10-CM | POA: Diagnosis not present

## 2021-01-11 DIAGNOSIS — D689 Coagulation defect, unspecified: Secondary | ICD-10-CM | POA: Diagnosis not present

## 2021-01-11 DIAGNOSIS — N186 End stage renal disease: Secondary | ICD-10-CM | POA: Diagnosis not present

## 2021-01-11 DIAGNOSIS — Z23 Encounter for immunization: Secondary | ICD-10-CM | POA: Diagnosis not present

## 2021-01-11 DIAGNOSIS — Z283 Underimmunization status: Secondary | ICD-10-CM | POA: Diagnosis not present

## 2021-01-11 DIAGNOSIS — Z992 Dependence on renal dialysis: Secondary | ICD-10-CM | POA: Diagnosis not present

## 2021-01-12 DIAGNOSIS — N281 Cyst of kidney, acquired: Secondary | ICD-10-CM | POA: Diagnosis not present

## 2021-01-13 DIAGNOSIS — D689 Coagulation defect, unspecified: Secondary | ICD-10-CM | POA: Diagnosis not present

## 2021-01-13 DIAGNOSIS — N2581 Secondary hyperparathyroidism of renal origin: Secondary | ICD-10-CM | POA: Diagnosis not present

## 2021-01-13 DIAGNOSIS — Z992 Dependence on renal dialysis: Secondary | ICD-10-CM | POA: Diagnosis not present

## 2021-01-13 DIAGNOSIS — N186 End stage renal disease: Secondary | ICD-10-CM | POA: Diagnosis not present

## 2021-01-13 DIAGNOSIS — Z23 Encounter for immunization: Secondary | ICD-10-CM | POA: Diagnosis not present

## 2021-01-13 DIAGNOSIS — R519 Headache, unspecified: Secondary | ICD-10-CM | POA: Diagnosis not present

## 2021-01-13 DIAGNOSIS — Z283 Underimmunization status: Secondary | ICD-10-CM | POA: Diagnosis not present

## 2021-01-16 DIAGNOSIS — D689 Coagulation defect, unspecified: Secondary | ICD-10-CM | POA: Diagnosis not present

## 2021-01-16 DIAGNOSIS — R519 Headache, unspecified: Secondary | ICD-10-CM | POA: Diagnosis not present

## 2021-01-16 DIAGNOSIS — N186 End stage renal disease: Secondary | ICD-10-CM | POA: Diagnosis not present

## 2021-01-16 DIAGNOSIS — Z283 Underimmunization status: Secondary | ICD-10-CM | POA: Diagnosis not present

## 2021-01-16 DIAGNOSIS — Z992 Dependence on renal dialysis: Secondary | ICD-10-CM | POA: Diagnosis not present

## 2021-01-16 DIAGNOSIS — N2581 Secondary hyperparathyroidism of renal origin: Secondary | ICD-10-CM | POA: Diagnosis not present

## 2021-01-16 DIAGNOSIS — Z23 Encounter for immunization: Secondary | ICD-10-CM | POA: Diagnosis not present

## 2021-01-18 DIAGNOSIS — R519 Headache, unspecified: Secondary | ICD-10-CM | POA: Diagnosis not present

## 2021-01-18 DIAGNOSIS — Z23 Encounter for immunization: Secondary | ICD-10-CM | POA: Diagnosis not present

## 2021-01-18 DIAGNOSIS — D689 Coagulation defect, unspecified: Secondary | ICD-10-CM | POA: Diagnosis not present

## 2021-01-18 DIAGNOSIS — Z283 Underimmunization status: Secondary | ICD-10-CM | POA: Diagnosis not present

## 2021-01-18 DIAGNOSIS — Z992 Dependence on renal dialysis: Secondary | ICD-10-CM | POA: Diagnosis not present

## 2021-01-18 DIAGNOSIS — N186 End stage renal disease: Secondary | ICD-10-CM | POA: Diagnosis not present

## 2021-01-18 DIAGNOSIS — N2581 Secondary hyperparathyroidism of renal origin: Secondary | ICD-10-CM | POA: Diagnosis not present

## 2021-01-20 DIAGNOSIS — N186 End stage renal disease: Secondary | ICD-10-CM | POA: Diagnosis not present

## 2021-01-20 DIAGNOSIS — R519 Headache, unspecified: Secondary | ICD-10-CM | POA: Diagnosis not present

## 2021-01-20 DIAGNOSIS — Z283 Underimmunization status: Secondary | ICD-10-CM | POA: Diagnosis not present

## 2021-01-20 DIAGNOSIS — Z992 Dependence on renal dialysis: Secondary | ICD-10-CM | POA: Diagnosis not present

## 2021-01-20 DIAGNOSIS — Z23 Encounter for immunization: Secondary | ICD-10-CM | POA: Diagnosis not present

## 2021-01-20 DIAGNOSIS — D689 Coagulation defect, unspecified: Secondary | ICD-10-CM | POA: Diagnosis not present

## 2021-01-20 DIAGNOSIS — N2581 Secondary hyperparathyroidism of renal origin: Secondary | ICD-10-CM | POA: Diagnosis not present

## 2021-01-23 DIAGNOSIS — Z992 Dependence on renal dialysis: Secondary | ICD-10-CM | POA: Diagnosis not present

## 2021-01-23 DIAGNOSIS — N186 End stage renal disease: Secondary | ICD-10-CM | POA: Diagnosis not present

## 2021-01-23 DIAGNOSIS — Z283 Underimmunization status: Secondary | ICD-10-CM | POA: Diagnosis not present

## 2021-01-23 DIAGNOSIS — D689 Coagulation defect, unspecified: Secondary | ICD-10-CM | POA: Diagnosis not present

## 2021-01-23 DIAGNOSIS — N2581 Secondary hyperparathyroidism of renal origin: Secondary | ICD-10-CM | POA: Diagnosis not present

## 2021-01-23 DIAGNOSIS — Z23 Encounter for immunization: Secondary | ICD-10-CM | POA: Diagnosis not present

## 2021-01-23 DIAGNOSIS — R519 Headache, unspecified: Secondary | ICD-10-CM | POA: Diagnosis not present

## 2021-01-25 DIAGNOSIS — N186 End stage renal disease: Secondary | ICD-10-CM | POA: Diagnosis not present

## 2021-01-25 DIAGNOSIS — R519 Headache, unspecified: Secondary | ICD-10-CM | POA: Diagnosis not present

## 2021-01-25 DIAGNOSIS — Z283 Underimmunization status: Secondary | ICD-10-CM | POA: Diagnosis not present

## 2021-01-25 DIAGNOSIS — D689 Coagulation defect, unspecified: Secondary | ICD-10-CM | POA: Diagnosis not present

## 2021-01-25 DIAGNOSIS — Z23 Encounter for immunization: Secondary | ICD-10-CM | POA: Diagnosis not present

## 2021-01-25 DIAGNOSIS — Z992 Dependence on renal dialysis: Secondary | ICD-10-CM | POA: Diagnosis not present

## 2021-01-25 DIAGNOSIS — N2581 Secondary hyperparathyroidism of renal origin: Secondary | ICD-10-CM | POA: Diagnosis not present

## 2021-01-27 DIAGNOSIS — Z23 Encounter for immunization: Secondary | ICD-10-CM | POA: Diagnosis not present

## 2021-01-27 DIAGNOSIS — N2581 Secondary hyperparathyroidism of renal origin: Secondary | ICD-10-CM | POA: Diagnosis not present

## 2021-01-27 DIAGNOSIS — Z283 Underimmunization status: Secondary | ICD-10-CM | POA: Diagnosis not present

## 2021-01-27 DIAGNOSIS — R519 Headache, unspecified: Secondary | ICD-10-CM | POA: Diagnosis not present

## 2021-01-27 DIAGNOSIS — N186 End stage renal disease: Secondary | ICD-10-CM | POA: Diagnosis not present

## 2021-01-27 DIAGNOSIS — Z992 Dependence on renal dialysis: Secondary | ICD-10-CM | POA: Diagnosis not present

## 2021-01-27 DIAGNOSIS — D689 Coagulation defect, unspecified: Secondary | ICD-10-CM | POA: Diagnosis not present

## 2021-01-29 DIAGNOSIS — Z992 Dependence on renal dialysis: Secondary | ICD-10-CM | POA: Diagnosis not present

## 2021-01-29 DIAGNOSIS — E1129 Type 2 diabetes mellitus with other diabetic kidney complication: Secondary | ICD-10-CM | POA: Diagnosis not present

## 2021-01-29 DIAGNOSIS — N186 End stage renal disease: Secondary | ICD-10-CM | POA: Diagnosis not present

## 2021-01-30 DIAGNOSIS — N2581 Secondary hyperparathyroidism of renal origin: Secondary | ICD-10-CM | POA: Diagnosis not present

## 2021-01-30 DIAGNOSIS — Z283 Underimmunization status: Secondary | ICD-10-CM | POA: Diagnosis not present

## 2021-01-30 DIAGNOSIS — R519 Headache, unspecified: Secondary | ICD-10-CM | POA: Diagnosis not present

## 2021-01-30 DIAGNOSIS — Z992 Dependence on renal dialysis: Secondary | ICD-10-CM | POA: Diagnosis not present

## 2021-01-30 DIAGNOSIS — Z23 Encounter for immunization: Secondary | ICD-10-CM | POA: Diagnosis not present

## 2021-01-30 DIAGNOSIS — D689 Coagulation defect, unspecified: Secondary | ICD-10-CM | POA: Diagnosis not present

## 2021-01-30 DIAGNOSIS — N186 End stage renal disease: Secondary | ICD-10-CM | POA: Diagnosis not present

## 2021-02-01 DIAGNOSIS — N2581 Secondary hyperparathyroidism of renal origin: Secondary | ICD-10-CM | POA: Diagnosis not present

## 2021-02-01 DIAGNOSIS — Z23 Encounter for immunization: Secondary | ICD-10-CM | POA: Diagnosis not present

## 2021-02-01 DIAGNOSIS — R519 Headache, unspecified: Secondary | ICD-10-CM | POA: Diagnosis not present

## 2021-02-01 DIAGNOSIS — Z992 Dependence on renal dialysis: Secondary | ICD-10-CM | POA: Diagnosis not present

## 2021-02-01 DIAGNOSIS — N186 End stage renal disease: Secondary | ICD-10-CM | POA: Diagnosis not present

## 2021-02-01 DIAGNOSIS — D689 Coagulation defect, unspecified: Secondary | ICD-10-CM | POA: Diagnosis not present

## 2021-02-01 DIAGNOSIS — Z283 Underimmunization status: Secondary | ICD-10-CM | POA: Diagnosis not present

## 2021-02-03 DIAGNOSIS — Z992 Dependence on renal dialysis: Secondary | ICD-10-CM | POA: Diagnosis not present

## 2021-02-03 DIAGNOSIS — Z283 Underimmunization status: Secondary | ICD-10-CM | POA: Diagnosis not present

## 2021-02-03 DIAGNOSIS — Z23 Encounter for immunization: Secondary | ICD-10-CM | POA: Diagnosis not present

## 2021-02-03 DIAGNOSIS — N186 End stage renal disease: Secondary | ICD-10-CM | POA: Diagnosis not present

## 2021-02-03 DIAGNOSIS — D689 Coagulation defect, unspecified: Secondary | ICD-10-CM | POA: Diagnosis not present

## 2021-02-03 DIAGNOSIS — N2581 Secondary hyperparathyroidism of renal origin: Secondary | ICD-10-CM | POA: Diagnosis not present

## 2021-02-03 DIAGNOSIS — R519 Headache, unspecified: Secondary | ICD-10-CM | POA: Diagnosis not present

## 2021-02-05 DIAGNOSIS — J3089 Other allergic rhinitis: Secondary | ICD-10-CM | POA: Diagnosis not present

## 2021-02-05 DIAGNOSIS — Z20822 Contact with and (suspected) exposure to covid-19: Secondary | ICD-10-CM | POA: Diagnosis not present

## 2021-02-05 DIAGNOSIS — J302 Other seasonal allergic rhinitis: Secondary | ICD-10-CM | POA: Diagnosis not present

## 2021-02-06 DIAGNOSIS — Z992 Dependence on renal dialysis: Secondary | ICD-10-CM | POA: Diagnosis not present

## 2021-02-06 DIAGNOSIS — E876 Hypokalemia: Secondary | ICD-10-CM | POA: Diagnosis not present

## 2021-02-06 DIAGNOSIS — N2581 Secondary hyperparathyroidism of renal origin: Secondary | ICD-10-CM | POA: Diagnosis not present

## 2021-02-06 DIAGNOSIS — D689 Coagulation defect, unspecified: Secondary | ICD-10-CM | POA: Diagnosis not present

## 2021-02-06 DIAGNOSIS — R519 Headache, unspecified: Secondary | ICD-10-CM | POA: Diagnosis not present

## 2021-02-06 DIAGNOSIS — N186 End stage renal disease: Secondary | ICD-10-CM | POA: Diagnosis not present

## 2021-02-08 DIAGNOSIS — N2581 Secondary hyperparathyroidism of renal origin: Secondary | ICD-10-CM | POA: Diagnosis not present

## 2021-02-08 DIAGNOSIS — E876 Hypokalemia: Secondary | ICD-10-CM | POA: Diagnosis not present

## 2021-02-08 DIAGNOSIS — D689 Coagulation defect, unspecified: Secondary | ICD-10-CM | POA: Diagnosis not present

## 2021-02-08 DIAGNOSIS — Z992 Dependence on renal dialysis: Secondary | ICD-10-CM | POA: Diagnosis not present

## 2021-02-08 DIAGNOSIS — R519 Headache, unspecified: Secondary | ICD-10-CM | POA: Diagnosis not present

## 2021-02-08 DIAGNOSIS — N186 End stage renal disease: Secondary | ICD-10-CM | POA: Diagnosis not present

## 2021-02-10 DIAGNOSIS — R519 Headache, unspecified: Secondary | ICD-10-CM | POA: Diagnosis not present

## 2021-02-10 DIAGNOSIS — E876 Hypokalemia: Secondary | ICD-10-CM | POA: Diagnosis not present

## 2021-02-10 DIAGNOSIS — N186 End stage renal disease: Secondary | ICD-10-CM | POA: Diagnosis not present

## 2021-02-10 DIAGNOSIS — D689 Coagulation defect, unspecified: Secondary | ICD-10-CM | POA: Diagnosis not present

## 2021-02-10 DIAGNOSIS — N2581 Secondary hyperparathyroidism of renal origin: Secondary | ICD-10-CM | POA: Diagnosis not present

## 2021-02-10 DIAGNOSIS — Z992 Dependence on renal dialysis: Secondary | ICD-10-CM | POA: Diagnosis not present

## 2021-02-13 ENCOUNTER — Other Ambulatory Visit: Payer: Self-pay | Admitting: Cardiology

## 2021-02-13 DIAGNOSIS — N186 End stage renal disease: Secondary | ICD-10-CM | POA: Diagnosis not present

## 2021-02-13 DIAGNOSIS — N2581 Secondary hyperparathyroidism of renal origin: Secondary | ICD-10-CM | POA: Diagnosis not present

## 2021-02-13 DIAGNOSIS — Z992 Dependence on renal dialysis: Secondary | ICD-10-CM | POA: Diagnosis not present

## 2021-02-13 DIAGNOSIS — R519 Headache, unspecified: Secondary | ICD-10-CM | POA: Diagnosis not present

## 2021-02-13 DIAGNOSIS — D689 Coagulation defect, unspecified: Secondary | ICD-10-CM | POA: Diagnosis not present

## 2021-02-15 DIAGNOSIS — R519 Headache, unspecified: Secondary | ICD-10-CM | POA: Diagnosis not present

## 2021-02-15 DIAGNOSIS — N186 End stage renal disease: Secondary | ICD-10-CM | POA: Diagnosis not present

## 2021-02-15 DIAGNOSIS — N2581 Secondary hyperparathyroidism of renal origin: Secondary | ICD-10-CM | POA: Diagnosis not present

## 2021-02-15 DIAGNOSIS — Z992 Dependence on renal dialysis: Secondary | ICD-10-CM | POA: Diagnosis not present

## 2021-02-15 DIAGNOSIS — D689 Coagulation defect, unspecified: Secondary | ICD-10-CM | POA: Diagnosis not present

## 2021-02-17 DIAGNOSIS — R519 Headache, unspecified: Secondary | ICD-10-CM | POA: Diagnosis not present

## 2021-02-17 DIAGNOSIS — N2581 Secondary hyperparathyroidism of renal origin: Secondary | ICD-10-CM | POA: Diagnosis not present

## 2021-02-17 DIAGNOSIS — N186 End stage renal disease: Secondary | ICD-10-CM | POA: Diagnosis not present

## 2021-02-17 DIAGNOSIS — Z992 Dependence on renal dialysis: Secondary | ICD-10-CM | POA: Diagnosis not present

## 2021-02-17 DIAGNOSIS — D689 Coagulation defect, unspecified: Secondary | ICD-10-CM | POA: Diagnosis not present

## 2021-02-20 DIAGNOSIS — D689 Coagulation defect, unspecified: Secondary | ICD-10-CM | POA: Diagnosis not present

## 2021-02-20 DIAGNOSIS — R519 Headache, unspecified: Secondary | ICD-10-CM | POA: Diagnosis not present

## 2021-02-20 DIAGNOSIS — Z992 Dependence on renal dialysis: Secondary | ICD-10-CM | POA: Diagnosis not present

## 2021-02-20 DIAGNOSIS — N186 End stage renal disease: Secondary | ICD-10-CM | POA: Diagnosis not present

## 2021-02-20 DIAGNOSIS — N2581 Secondary hyperparathyroidism of renal origin: Secondary | ICD-10-CM | POA: Diagnosis not present

## 2021-02-20 DIAGNOSIS — E876 Hypokalemia: Secondary | ICD-10-CM | POA: Diagnosis not present

## 2021-02-22 DIAGNOSIS — E876 Hypokalemia: Secondary | ICD-10-CM | POA: Diagnosis not present

## 2021-02-22 DIAGNOSIS — R519 Headache, unspecified: Secondary | ICD-10-CM | POA: Diagnosis not present

## 2021-02-22 DIAGNOSIS — N2581 Secondary hyperparathyroidism of renal origin: Secondary | ICD-10-CM | POA: Diagnosis not present

## 2021-02-22 DIAGNOSIS — D689 Coagulation defect, unspecified: Secondary | ICD-10-CM | POA: Diagnosis not present

## 2021-02-22 DIAGNOSIS — N186 End stage renal disease: Secondary | ICD-10-CM | POA: Diagnosis not present

## 2021-02-22 DIAGNOSIS — Z992 Dependence on renal dialysis: Secondary | ICD-10-CM | POA: Diagnosis not present

## 2021-02-24 DIAGNOSIS — E876 Hypokalemia: Secondary | ICD-10-CM | POA: Diagnosis not present

## 2021-02-24 DIAGNOSIS — D689 Coagulation defect, unspecified: Secondary | ICD-10-CM | POA: Diagnosis not present

## 2021-02-24 DIAGNOSIS — Z992 Dependence on renal dialysis: Secondary | ICD-10-CM | POA: Diagnosis not present

## 2021-02-24 DIAGNOSIS — N186 End stage renal disease: Secondary | ICD-10-CM | POA: Diagnosis not present

## 2021-02-24 DIAGNOSIS — R519 Headache, unspecified: Secondary | ICD-10-CM | POA: Diagnosis not present

## 2021-02-24 DIAGNOSIS — N2581 Secondary hyperparathyroidism of renal origin: Secondary | ICD-10-CM | POA: Diagnosis not present

## 2021-02-27 ENCOUNTER — Other Ambulatory Visit: Payer: Self-pay | Admitting: Cardiology

## 2021-02-27 DIAGNOSIS — R519 Headache, unspecified: Secondary | ICD-10-CM | POA: Diagnosis not present

## 2021-02-27 DIAGNOSIS — N2581 Secondary hyperparathyroidism of renal origin: Secondary | ICD-10-CM | POA: Diagnosis not present

## 2021-02-27 DIAGNOSIS — D689 Coagulation defect, unspecified: Secondary | ICD-10-CM | POA: Diagnosis not present

## 2021-02-27 DIAGNOSIS — N186 End stage renal disease: Secondary | ICD-10-CM | POA: Diagnosis not present

## 2021-02-27 DIAGNOSIS — Z992 Dependence on renal dialysis: Secondary | ICD-10-CM | POA: Diagnosis not present

## 2021-02-28 ENCOUNTER — Other Ambulatory Visit: Payer: Self-pay

## 2021-02-28 ENCOUNTER — Ambulatory Visit (INDEPENDENT_AMBULATORY_CARE_PROVIDER_SITE_OTHER): Payer: Medicare Other | Admitting: Nurse Practitioner

## 2021-02-28 VITALS — BP 122/88 | HR 76 | Temp 97.7°F | Ht 64.8 in | Wt 242.8 lb

## 2021-02-28 DIAGNOSIS — N186 End stage renal disease: Secondary | ICD-10-CM | POA: Diagnosis not present

## 2021-02-28 DIAGNOSIS — I5032 Chronic diastolic (congestive) heart failure: Secondary | ICD-10-CM | POA: Diagnosis not present

## 2021-02-28 DIAGNOSIS — I129 Hypertensive chronic kidney disease with stage 1 through stage 4 chronic kidney disease, or unspecified chronic kidney disease: Secondary | ICD-10-CM

## 2021-02-28 DIAGNOSIS — Z992 Dependence on renal dialysis: Secondary | ICD-10-CM

## 2021-02-28 DIAGNOSIS — E1129 Type 2 diabetes mellitus with other diabetic kidney complication: Secondary | ICD-10-CM

## 2021-02-28 DIAGNOSIS — I509 Heart failure, unspecified: Secondary | ICD-10-CM

## 2021-02-28 DIAGNOSIS — I959 Hypotension, unspecified: Secondary | ICD-10-CM

## 2021-02-28 DIAGNOSIS — E559 Vitamin D deficiency, unspecified: Secondary | ICD-10-CM | POA: Diagnosis not present

## 2021-02-28 DIAGNOSIS — E782 Mixed hyperlipidemia: Secondary | ICD-10-CM

## 2021-02-28 DIAGNOSIS — Z6841 Body Mass Index (BMI) 40.0 and over, adult: Secondary | ICD-10-CM

## 2021-02-28 DIAGNOSIS — I130 Hypertensive heart and chronic kidney disease with heart failure and stage 1 through stage 4 chronic kidney disease, or unspecified chronic kidney disease: Secondary | ICD-10-CM

## 2021-02-28 DIAGNOSIS — E662 Morbid (severe) obesity with alveolar hypoventilation: Secondary | ICD-10-CM

## 2021-02-28 DIAGNOSIS — I10 Essential (primary) hypertension: Secondary | ICD-10-CM

## 2021-02-28 DIAGNOSIS — H6122 Impacted cerumen, left ear: Secondary | ICD-10-CM

## 2021-02-28 DIAGNOSIS — N2581 Secondary hyperparathyroidism of renal origin: Secondary | ICD-10-CM

## 2021-02-28 DIAGNOSIS — Z0000 Encounter for general adult medical examination without abnormal findings: Secondary | ICD-10-CM

## 2021-02-28 DIAGNOSIS — R9431 Abnormal electrocardiogram [ECG] [EKG]: Secondary | ICD-10-CM

## 2021-02-28 DIAGNOSIS — R809 Proteinuria, unspecified: Secondary | ICD-10-CM

## 2021-02-28 DIAGNOSIS — I5033 Acute on chronic diastolic (congestive) heart failure: Secondary | ICD-10-CM

## 2021-02-28 DIAGNOSIS — Z0001 Encounter for general adult medical examination with abnormal findings: Secondary | ICD-10-CM

## 2021-02-28 NOTE — Progress Notes (Addendum)
I,Tianna Badgett,acting as a Education administrator for [employer], NP.,have documented all relevant documentation on the behalf of [employer], NP,as directed by  Bary Castilla, NP while in the presence of Bary Castilla, NP.  This visit occurred during the SARS-CoV-2 public health emergency.  Safety protocols were in place, including screening questions prior to the visit, additional usage of staff PPE, and extensive cleaning of exam room while observing appropriate contact time as indicated for disinfecting solutions.  Subjective:     Patient ID: Jacqueline [name] , female    DOB: September 19, 1946 , 75 y.o.   MRN: [id number]   Chief Complaint  Patient presents with  . Annual Exam    HPI  Patient is here for physical exam. She reports compliance with medications. She goes to dialysis T, Thurs and sat. She doesn't really follow a diet and she eats everything. She eats bacon, Mac and cheese, sausage. She does not exercise. She follows up with her cardiologist. No other concerns today. Patient declines for urine today stating she is unable to urinate at this time.  Hypertension This is a chronic problem. The current episode started more than 1 year ago. The problem is unchanged. The problem is controlled. Pertinent negatives include no anxiety, chest pain, palpitations or shortness of breath. There are no associated agents to hypertension. Risk factors for coronary artery disease include obesity. Past treatments include diuretics. Compliance problems include exercise.  There is no history of angina. There is no history of chronic renal disease.     Past Medical History:  Diagnosis Date  . Arthritis    "left knee" (12/04/2016)  . CAD (coronary artery disease)    a. 12/2016 NSTEMI/PCI: LM nl, LaD 20p/d, D1 20, OM2 20, OM3 20, RCA 52m(3.0x18 Resolute DES). EF 65%.  . Chronic lower back pain   . Diverticulosis    on CT  . ESRD on dialysis [facility]  (12/04/2016)  . GERD (gastroesophageal reflux disease)   . Hiatal hernia   . Hyperlipidemia   . Hypertension    a. 12/2016 labetalol d/c'd 2/2 hypotension in HD.  . Morbid obesity ([facility]   . Obesity   . Pneumonia    "couple times" (12/04/2016)  . Renal insufficiency   . Type II diabetes mellitus (HCC)      Family History  Problem Relation Age of Onset  . Diabetes Mother   . Peripheral vascular disease Mother        amputation  . CAD Father   . Heart disease Father   . Hypertension Father   . Heart attack Father   . CAD Sister   . Cancer Brother        x 2 brothers, type unknown  . Breast cancer Sister      Current Outpatient Medications:  .  aspirin EC 81 MG EC tablet, Take 1 tablet (81 mg total) by mouth daily., Disp: , Rfl:  .  AURYXIA 1 GM 210 MG(Fe) tablet, Take 420 mg by mouth 2 (two) times a day., Disp: , Rfl:  .  B Complex-C-Folic Acid (DIALYVITE 8294 0.8 MG TABS, Take 1 tablet by mouth daily., Disp: , Rfl:  .  benzoyl peroxide (BENZOYL PEROXIDE) 5 % external liquid, Apply topically 2 (two) times daily., Disp: 142 g, Rfl: 12 .  diclofenac sodium (VOLTAREN) 1 % GEL, Apply 2 g topically 4 (four) times daily., Disp: 100 g, Rfl: 2 .  ezetimibe (ZETIA) 10 MG tablet, Take  1 tablet (10 mg total) by mouth daily. Please make yearly appt with Dr. Radford Pax for February 2022 for future refills. Thank you 1st attempt, Disp: 90 tablet, Rfl: 0 .  metoCLOPramide (REGLAN) 10 MG tablet, TAKE 1 TABLET BY MOUTH  TWICE DAILY 1/2 HOUR BEFORE MEALS AND AT BEDTIME, Disp: 120 tablet, Rfl: 0 .  midodrine (PROAMATINE) 10 MG tablet, Take 10 mg by mouth 3 (three) times a week. Dialysis days Tues, Thurs Saturday, Disp: , Rfl:  .  Omega-3 Fatty Acids (FISH OIL) 1000 MG CPDR, Take 1,000 mg by mouth daily. , Disp: , Rfl:  .  omeprazole (PRILOSEC) 20 MG capsule, TAKE 1 CAPSULE BY MOUTH EVERY DAY BEFORE A MEAL, Disp: 90 capsule, Rfl: 1 .  rosuvastatin (CRESTOR) 40 MG tablet, Take 1 tablet (40 mg total) by mouth  daily. Please call and schedule overdue follow up appt for further refills. 1st attempt, Disp: 30 tablet, Rfl: 0 .  Vitamin D, Ergocalciferol, 50 MCG (2000 UT) CAPS, Take 2,000 Units by mouth daily., Disp: , Rfl:    Allergies  Allergen Reactions  . Other Other (See Comments)  . Sulfa Antibiotics Other (See Comments)  . Sulfa Drugs Cross Reactors Other (See Comments)    Doesn't remember       Last LMP was No LMP recorded. Patient has had a hysterectomy.. Negative for: breast discharge, breast lump(s), breast pain and breast self exam. Associated symptoms include abnormal vaginal bleeding. Pertinent negatives include abnormal bleeding (hematology), anxiety, decreased libido, depression, difficulty falling sleep, dyspareunia, history of infertility, nocturia, sexual dysfunction, sleep disturbances, urinary incontinence, urinary urgency, vaginal discharge and vaginal itching. Diet regular.The patient states her exercise level is    . The patient's tobacco use is:  Social History   Tobacco Use  Smoking Status Former Smoker  . Packs/day: 1.00  . Years: 20.00  . Pack years: 20.00  . Types: Cigarettes  . Quit date: 44  . Years since quitting: 37.2  Smokeless Tobacco Never Used  . She has been exposed to passive smoke. The patient's alcohol use is:  Social History   Substance and Sexual Activity  Alcohol Use No  . Alcohol/week: 0.0 standard drinks    Review of Systems  Constitutional: Negative.  Negative for chills, fatigue and fever.  HENT: Negative.  Negative for congestion, sinus pressure, sinus pain and sneezing.   Eyes: Negative.   Respiratory: Negative.  Negative for cough, chest tightness and shortness of breath.        SOB when she walks   Cardiovascular: Negative.  Negative for chest pain and palpitations.  Gastrointestinal: Negative.  Negative for constipation and diarrhea.  Endocrine: Negative.  Negative for polydipsia, polyphagia and polyuria.  Genitourinary: Negative.   Negative for pelvic pain and vaginal pain.  Musculoskeletal: Positive for myalgias.       Gen. Muscle pain   Skin: Negative.   Allergic/Immunologic: Negative.   Neurological: Negative.  Negative for dizziness, weakness, light-headedness and numbness.  Hematological: Negative.   Psychiatric/Behavioral: Negative.  Negative for agitation.     Today's Vitals   02/28/21 0949  BP: 122/88  Pulse: 76  Temp: 97.7 F (36.5 C)  TempSrc: Oral  Weight: 242 lb 12.8 oz (110.1 kg)  Height: 5' 4.8" (1.646 m)  PainSc: 0-No pain   Body mass index is 40.65 kg/m.   Objective:  Physical Exam Constitutional:      Appearance: Normal appearance. She is obese.  HENT:     Head: Normocephalic and atraumatic.  Right Ear: Tympanic membrane and ear canal normal. There is no impacted cerumen.     Left Ear: Tympanic membrane and ear canal normal. There is impacted cerumen.     Nose: Nose normal.  Eyes:     Extraocular Movements: Extraocular movements intact.     Pupils: Pupils are equal, round, and reactive to light.  Cardiovascular:     Rate and Rhythm: Normal rate and regular rhythm.     Pulses: Normal pulses.     Heart sounds: Normal heart sounds.     Arteriovenous access: left arteriovenous access is present. Pulmonary:     Effort: Pulmonary effort is normal.     Breath sounds: Normal breath sounds. No wheezing.  Abdominal:     General: Bowel sounds are normal. There is distension.     Palpations: Abdomen is soft.  Genitourinary:    Comments: Deferred  Musculoskeletal:        General: Normal range of motion.     Cervical back: Normal range of motion and neck supple. No tenderness.     Comments: Gen. weakness  Skin:    General: Skin is dry.     Capillary Refill: Capillary refill takes less than 2 seconds.  Neurological:     Mental Status: She is alert and oriented to person, place, and time. Mental status is at baseline.  Psychiatric:        Mood and Affect: Mood normal.         Behavior: Behavior normal.        Thought Content: Thought content normal.        Judgment: Judgment normal.         Assessment And Plan:     1. Health maintenance examination  Behavior modifications discussed and diet history reviewed.   Pt will continue to exercise regularly and modify diet with low GI, plant based foods and decrease intake of processed foods.   Recommend intake of daily multivitamin, Vitamin D, and calcium.   Recommend mammogram and colonoscopy for preventive screenings, as well as recommend immunizations that include pneumonia 23 - CBC - CMP14+EGFR -EKG-SR with possible pulmonary disease.   2. 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 Hypotension, unspecified hypotension type  -Stable  -Patient gets dialysis  -Takes Midodrine for BP support with dialysis.   4. Type 2 diabetes mellitus with microalbuminuria, without long-term current use of insulin ([place]) -She gets her BS checked when she get dialysis  - Will Hemoglobin A1c - CMP14+EGFR  5. Mixed hyperlipidemia -Chronic, controlled  -Continue with current meds.  - Lipid panel  6. ESRD (end stage renal disease) on dialysis ([place]) -Continue with dialysis   7. Chronic diastolic heart failure (HCC)  -Chronic, stable  -Continue follow up with cardiologist  -Limit water intake.  -Weigh daily   8. 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[place]) -Followed by nephrology   9. 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HCC) -Continue and encourage exercise as tolerated.   10. Vitamin D deficiency -Will assess and supplement if needed  - VITAMIN D 25 Hydroxy (Vit-D Deficiency, Fractures)  11. Abnormal finding on EKG -EKG- SR with possible pulmonary disease  - Ambulatory referral to Pulmonology  12. Impacted cerumen of left ear -Irrigated with curette in the left ear.    Patient was given opportunity to  ask questions. Patient verbalized understanding of the plan and was able to repeat key elements of the plan.  All questions were answered to their satisfaction. 02/28/2021  Derrek Puff [employer] have reviewed all documentation for this visit. The documentation on for the exam, diagnosis, procedures, and orders are all accurate and complete.  THE PATIENT IS ENCOURAGED TO PRACTICE SOCIAL DISTANCING DUE TO THE COVID-19 PANDEMIC.

## 2021-02-28 NOTE — Patient Instructions (Signed)
Health Maintenance, Female Adopting a healthy lifestyle and getting preventive care are important in promoting health and wellness. Ask your health care provider about:  The right schedule for you to have regular tests and exams.  Things you can do on your own to prevent diseases and keep yourself healthy. What should I know about diet, weight, and exercise? Eat a healthy diet  Eat a diet that includes plenty of vegetables, fruits, low-fat dairy products, and lean protein.  Do not eat a lot of foods that are high in solid fats, added sugars, or sodium.   Maintain a healthy weight Body mass index (BMI) is used to identify weight problems. It estimates body fat based on height and weight. Your health care provider can help determine your BMI and help you achieve or maintain a healthy weight. Get regular exercise Get regular exercise. This is one of the most important things you can do for your health. Most adults should:  Exercise for at least 150 minutes each week. The exercise should increase your heart rate and make you sweat (moderate-intensity exercise).  Do strengthening exercises at least twice a week. This is in addition to the moderate-intensity exercise.  Spend less time sitting. Even light physical activity can be beneficial. Watch cholesterol and blood lipids Have your blood tested for lipids and cholesterol at 77 years of age, then have this test every 5 years. Have your cholesterol levels checked more often if:  Your lipid or cholesterol levels are high.  You are older than 77 years of age.  You are at high risk for heart disease. What should I know about cancer screening? Depending on your health history and family history, you may need to have cancer screening at various ages. This may include screening for:  Breast cancer.  Cervical cancer.  Colorectal cancer.  Skin cancer.  Lung cancer. What should I know about heart disease, diabetes, and high blood  pressure? Blood pressure and heart disease  High blood pressure causes heart disease and increases the risk of stroke. This is more likely to develop in people who have high blood pressure readings, are of African descent, or are overweight.  Have your blood pressure checked: ? Every 3-5 years if you are 18-39 years of age. ? Every year if you are 40 years old or older. Diabetes Have regular diabetes screenings. This checks your fasting blood sugar level. Have the screening done:  Once every three years after age 40 if you are at a normal weight and have a low risk for diabetes.  More often and at a younger age if you are overweight or have a high risk for diabetes. What should I know about preventing infection? Hepatitis B If you have a higher risk for hepatitis B, you should be screened for this virus. Talk with your health care provider to find out if you are at risk for hepatitis B infection. Hepatitis C Testing is recommended for:  Everyone born from 1945 through 1965.  Anyone with known risk factors for hepatitis C. Sexually transmitted infections (STIs)  Get screened for STIs, including gonorrhea and chlamydia, if: ? You are sexually active and are younger than 77 years of age. ? You are older than 77 years of age and your health care provider tells you that you are at risk for this type of infection. ? Your sexual activity has changed since you were last screened, and you are at increased risk for chlamydia or gonorrhea. Ask your health care provider   if you are at risk.  Ask your health care provider about whether you are at high risk for HIV. Your health care provider may recommend a prescription medicine to help prevent HIV infection. If you choose to take medicine to prevent HIV, you should first get tested for HIV. You should then be tested every 3 months for as long as you are taking the medicine. Pregnancy  If you are about to stop having your period (premenopausal) and  you may become pregnant, seek counseling before you get pregnant.  Take 400 to 800 micrograms (mcg) of folic acid every day if you become pregnant.  Ask for birth control (contraception) if you want to prevent pregnancy. Osteoporosis and menopause Osteoporosis is a disease in which the bones lose minerals and strength with aging. This can result in bone fractures. If you are 65 years old or older, or if you are at risk for osteoporosis and fractures, ask your health care provider if you should:  Be screened for bone loss.  Take a calcium or vitamin D supplement to lower your risk of fractures.  Be given hormone replacement therapy (HRT) to treat symptoms of menopause. Follow these instructions at home: Lifestyle  Do not use any products that contain nicotine or tobacco, such as cigarettes, e-cigarettes, and chewing tobacco. If you need help quitting, ask your health care provider.  Do not use street drugs.  Do not share needles.  Ask your health care provider for help if you need support or information about quitting drugs. Alcohol use  Do not drink alcohol if: ? Your health care provider tells you not to drink. ? You are pregnant, may be pregnant, or are planning to become pregnant.  If you drink alcohol: ? Limit how much you use to 0-1 drink a day. ? Limit intake if you are breastfeeding.  Be aware of how much alcohol is in your drink. In the [place] one drink equals one 12 oz bottle of beer (355 mL), one 5 oz glass of wine (148 mL), or one 1 oz glass of hard liquor (44 mL). General instructions  Schedule regular health, dental, and eye exams.  Stay current with your vaccines.  Tell your health care provider if: ? You often feel depressed. ? You have ever been abused or do not feel safe at home. Summary  Adopting a healthy lifestyle and getting preventive care are important in promoting health and wellness.  Follow your health care provider's instructions about healthy  diet, exercising, and getting tested or screened for diseases.  Follow your health care provider's instructions on monitoring your cholesterol and blood pressure. This information is not intended to replace advice given to you by your health care provider. Make sure you discuss any questions you have with your health care provider. Document Revised: 11/11/2018 Document Reviewed: 11/11/2018 Elsevier Patient Education  2021 Elsevier Inc.  

## 2021-03-01 DIAGNOSIS — E1129 Type 2 diabetes mellitus with other diabetic kidney complication: Secondary | ICD-10-CM | POA: Diagnosis not present

## 2021-03-01 DIAGNOSIS — N2581 Secondary hyperparathyroidism of renal origin: Secondary | ICD-10-CM | POA: Diagnosis not present

## 2021-03-01 DIAGNOSIS — N186 End stage renal disease: Secondary | ICD-10-CM | POA: Diagnosis not present

## 2021-03-01 DIAGNOSIS — D689 Coagulation defect, unspecified: Secondary | ICD-10-CM | POA: Diagnosis not present

## 2021-03-01 DIAGNOSIS — Z992 Dependence on renal dialysis: Secondary | ICD-10-CM | POA: Diagnosis not present

## 2021-03-01 DIAGNOSIS — R519 Headache, unspecified: Secondary | ICD-10-CM | POA: Diagnosis not present

## 2021-03-01 LAB — CMP14+EGFR
ALT: 19 IU/L (ref 0–32)
AST: 32 IU/L (ref 0–40)
Albumin/Globulin Ratio: 1.3 (ref 1.2–2.2)
Albumin: 4.1 g/dL (ref 3.7–4.7)
Alkaline Phosphatase: 190 IU/L — ABNORMAL HIGH (ref 44–121)
BUN/Creatinine Ratio: 3 — ABNORMAL LOW (ref 12–28)
BUN: 24 mg/dL (ref 8–27)
Bilirubin Total: 0.5 mg/dL (ref 0.0–1.2)
CO2: 21 mmol/L (ref 20–29)
Calcium: 8.7 mg/dL (ref 8.7–10.3)
Chloride: 94 mmol/L — ABNORMAL LOW (ref 96–106)
Creatinine, Ser: 7.59 mg/dL — ABNORMAL HIGH (ref 0.57–1.00)
Globulin, Total: 3.2 g/dL (ref 1.5–4.5)
Glucose: 188 mg/dL — ABNORMAL HIGH (ref 65–99)
Potassium: 4.1 mmol/L (ref 3.5–5.2)
Sodium: 140 mmol/L (ref 134–144)
Total Protein: 7.3 g/dL (ref 6.0–8.5)
eGFR: 5 mL/min/{1.73_m2} — ABNORMAL LOW (ref >59)

## 2021-03-01 LAB — CBC
Hematocrit: 42.7 % (ref 34.0–46.6)
Hemoglobin: 13.3 g/dL (ref 11.1–15.9)
MCH: 30.2 pg (ref 26.6–33.0)
MCHC: 31.1 g/dL — ABNORMAL LOW (ref 31.5–35.7)
MCV: 97 fL (ref 79–97)
Platelets: 197 10*3/uL (ref 150–450)
RBC: 4.41 x10E6/uL (ref 3.77–5.28)
RDW: 14.3 % (ref 11.7–15.4)
WBC: 8.8 10*3/uL (ref 3.4–10.8)

## 2021-03-01 LAB — LIPID PANEL
Chol/HDL Ratio: 2.7 ratio (ref 0.0–4.4)
Cholesterol, Total: 133 mg/dL (ref 100–199)
HDL: 50 mg/dL (ref >39)
LDL Chol Calc (NIH): 63 mg/dL (ref 0–99)
Triglycerides: 112 mg/dL (ref 0–149)
VLDL Cholesterol Cal: 20 mg/dL (ref 5–40)

## 2021-03-01 LAB — VITAMIN D 25 HYDROXY (VIT D DEFICIENCY, FRACTURES): Vit D, 25-Hydroxy: 69.5 ng/mL (ref 30.0–100.0)

## 2021-03-01 LAB — HEMOGLOBIN A1C
Est. average glucose Bld gHb Est-mCnc: 163 mg/dL
Hgb A1c MFr Bld: 7.3 % — ABNORMAL HIGH (ref 4.8–5.6)

## 2021-03-02 ENCOUNTER — Ambulatory Visit (INDEPENDENT_AMBULATORY_CARE_PROVIDER_SITE_OTHER): Payer: Medicare Other | Admitting: Podiatry

## 2021-03-02 ENCOUNTER — Encounter: Payer: Self-pay | Admitting: Podiatry

## 2021-03-02 ENCOUNTER — Other Ambulatory Visit: Payer: Self-pay

## 2021-03-02 DIAGNOSIS — M79674 Pain in right toe(s): Secondary | ICD-10-CM | POA: Diagnosis not present

## 2021-03-02 DIAGNOSIS — B351 Tinea unguium: Secondary | ICD-10-CM | POA: Diagnosis not present

## 2021-03-02 DIAGNOSIS — M79675 Pain in left toe(s): Secondary | ICD-10-CM | POA: Diagnosis not present

## 2021-03-02 DIAGNOSIS — E1159 Type 2 diabetes mellitus with other circulatory complications: Secondary | ICD-10-CM

## 2021-03-02 DIAGNOSIS — L84 Corns and callosities: Secondary | ICD-10-CM

## 2021-03-02 NOTE — Progress Notes (Addendum)
This patient returns to my office for at risk foot care.  This patient requires this care by a professional since this patient will be at risk due to having  Diabetes.  This patient is unable to cut nails himself since the patient cannot reach his nails.These nails are painful walking and wearing shoes.  This patient presents for at risk foot care today.  General Appearance  Alert, conversant and in no acute stress.  Vascular  Dorsalis pedis and posterior tibial  pulses are not  palpable  bilaterally.  Capillary return is within normal limits  bilaterally. Temperature is within normal limits  bilaterally.  Neurologic  Senn-Weinstein monofilament wire test within normal limits  bilaterally. Muscle power within normal limits bilaterally.  Nails Thick disfigured discolored nails with subungual debris  from hallux to fifth toes bilaterally. No evidence of bacterial infection or drainage bilaterally.  Orthopedic  No limitations of motion  feet .  No crepitus or effusions noted.  No bony pathology or digital deformities noted.  Skin  normotropic skin with no porokeratosis noted bilaterally.  No signs of infections or ulcers noted.  Symptomatic callus left hallux.   Onychomycosis  Pain in right toes  Pain in left toes  Callus hallux.  Consent was obtained for treatment procedures.   Mechanical debridement of nails 1-5  bilaterally performed with a nail nipper.  Filed with dremel without incident.  Debide callus with # 15 blade.   Return office visit     4 months                 Told patient to return for periodic foot care and evaluation due to potential at risk complications.   Gardiner Barefoot DPM

## 2021-03-03 DIAGNOSIS — Z992 Dependence on renal dialysis: Secondary | ICD-10-CM | POA: Diagnosis not present

## 2021-03-03 DIAGNOSIS — D689 Coagulation defect, unspecified: Secondary | ICD-10-CM | POA: Diagnosis not present

## 2021-03-03 DIAGNOSIS — N186 End stage renal disease: Secondary | ICD-10-CM | POA: Diagnosis not present

## 2021-03-03 DIAGNOSIS — N2581 Secondary hyperparathyroidism of renal origin: Secondary | ICD-10-CM | POA: Diagnosis not present

## 2021-03-03 DIAGNOSIS — R519 Headache, unspecified: Secondary | ICD-10-CM | POA: Diagnosis not present

## 2021-03-05 NOTE — Progress Notes (Signed)
She is on dialysis. Forwarding her results to you

## 2021-03-06 ENCOUNTER — Telehealth: Payer: Self-pay

## 2021-03-06 ENCOUNTER — Other Ambulatory Visit: Payer: Self-pay | Admitting: Nurse Practitioner

## 2021-03-06 DIAGNOSIS — N186 End stage renal disease: Secondary | ICD-10-CM | POA: Diagnosis not present

## 2021-03-06 DIAGNOSIS — Z992 Dependence on renal dialysis: Secondary | ICD-10-CM | POA: Diagnosis not present

## 2021-03-06 DIAGNOSIS — D689 Coagulation defect, unspecified: Secondary | ICD-10-CM | POA: Diagnosis not present

## 2021-03-06 DIAGNOSIS — Z23 Encounter for immunization: Secondary | ICD-10-CM | POA: Diagnosis not present

## 2021-03-06 DIAGNOSIS — R519 Headache, unspecified: Secondary | ICD-10-CM | POA: Diagnosis not present

## 2021-03-06 DIAGNOSIS — E1129 Type 2 diabetes mellitus with other diabetic kidney complication: Secondary | ICD-10-CM | POA: Diagnosis not present

## 2021-03-06 DIAGNOSIS — N2581 Secondary hyperparathyroidism of renal origin: Secondary | ICD-10-CM | POA: Diagnosis not present

## 2021-03-06 NOTE — Telephone Encounter (Signed)
-----   Message from Bary Castilla, NP sent at 03/05/2021  9:58 PM EDT ----- Your Hgb A1c results are elevated. What are you eating? Are you avoiding intake of sugary foods and drinks? And avoiding carbs? Kidney functions are stable. Your liver functions are elevated. Your cholesterol level is normal.

## 2021-03-06 NOTE — Telephone Encounter (Signed)
I left a detailed. Message.

## 2021-03-07 ENCOUNTER — Other Ambulatory Visit: Payer: Self-pay | Admitting: Nurse Practitioner

## 2021-03-08 ENCOUNTER — Telehealth: Payer: Self-pay | Admitting: Nurse Practitioner

## 2021-03-08 DIAGNOSIS — D689 Coagulation defect, unspecified: Secondary | ICD-10-CM | POA: Diagnosis not present

## 2021-03-08 DIAGNOSIS — N186 End stage renal disease: Secondary | ICD-10-CM | POA: Diagnosis not present

## 2021-03-08 DIAGNOSIS — Z992 Dependence on renal dialysis: Secondary | ICD-10-CM | POA: Diagnosis not present

## 2021-03-08 DIAGNOSIS — Z23 Encounter for immunization: Secondary | ICD-10-CM | POA: Diagnosis not present

## 2021-03-08 DIAGNOSIS — E1129 Type 2 diabetes mellitus with other diabetic kidney complication: Secondary | ICD-10-CM | POA: Diagnosis not present

## 2021-03-08 DIAGNOSIS — R519 Headache, unspecified: Secondary | ICD-10-CM | POA: Diagnosis not present

## 2021-03-08 DIAGNOSIS — N2581 Secondary hyperparathyroidism of renal origin: Secondary | ICD-10-CM | POA: Diagnosis not present

## 2021-03-08 NOTE — Telephone Encounter (Signed)
Called Jacqueline [name] and left her a detailed message about her giving [place] a call back in regards to her starting insulin for her elevated Hgb A1c. Would like to discuss wit her.

## 2021-03-10 DIAGNOSIS — N2581 Secondary hyperparathyroidism of renal origin: Secondary | ICD-10-CM | POA: Diagnosis not present

## 2021-03-10 DIAGNOSIS — Z992 Dependence on renal dialysis: Secondary | ICD-10-CM | POA: Diagnosis not present

## 2021-03-10 DIAGNOSIS — Z23 Encounter for immunization: Secondary | ICD-10-CM | POA: Diagnosis not present

## 2021-03-10 DIAGNOSIS — D689 Coagulation defect, unspecified: Secondary | ICD-10-CM | POA: Diagnosis not present

## 2021-03-10 DIAGNOSIS — E1129 Type 2 diabetes mellitus with other diabetic kidney complication: Secondary | ICD-10-CM | POA: Diagnosis not present

## 2021-03-10 DIAGNOSIS — R519 Headache, unspecified: Secondary | ICD-10-CM | POA: Diagnosis not present

## 2021-03-10 DIAGNOSIS — N186 End stage renal disease: Secondary | ICD-10-CM | POA: Diagnosis not present

## 2021-03-13 DIAGNOSIS — D689 Coagulation defect, unspecified: Secondary | ICD-10-CM | POA: Diagnosis not present

## 2021-03-13 DIAGNOSIS — N186 End stage renal disease: Secondary | ICD-10-CM | POA: Diagnosis not present

## 2021-03-13 DIAGNOSIS — R519 Headache, unspecified: Secondary | ICD-10-CM | POA: Diagnosis not present

## 2021-03-13 DIAGNOSIS — Z992 Dependence on renal dialysis: Secondary | ICD-10-CM | POA: Diagnosis not present

## 2021-03-13 DIAGNOSIS — N2581 Secondary hyperparathyroidism of renal origin: Secondary | ICD-10-CM | POA: Diagnosis not present

## 2021-03-15 DIAGNOSIS — N186 End stage renal disease: Secondary | ICD-10-CM | POA: Diagnosis not present

## 2021-03-15 DIAGNOSIS — N2581 Secondary hyperparathyroidism of renal origin: Secondary | ICD-10-CM | POA: Diagnosis not present

## 2021-03-15 DIAGNOSIS — R519 Headache, unspecified: Secondary | ICD-10-CM | POA: Diagnosis not present

## 2021-03-15 DIAGNOSIS — D689 Coagulation defect, unspecified: Secondary | ICD-10-CM | POA: Diagnosis not present

## 2021-03-15 DIAGNOSIS — Z992 Dependence on renal dialysis: Secondary | ICD-10-CM | POA: Diagnosis not present

## 2021-03-17 DIAGNOSIS — Z992 Dependence on renal dialysis: Secondary | ICD-10-CM | POA: Diagnosis not present

## 2021-03-17 DIAGNOSIS — N186 End stage renal disease: Secondary | ICD-10-CM | POA: Diagnosis not present

## 2021-03-17 DIAGNOSIS — R519 Headache, unspecified: Secondary | ICD-10-CM | POA: Diagnosis not present

## 2021-03-17 DIAGNOSIS — N2581 Secondary hyperparathyroidism of renal origin: Secondary | ICD-10-CM | POA: Diagnosis not present

## 2021-03-17 DIAGNOSIS — D689 Coagulation defect, unspecified: Secondary | ICD-10-CM | POA: Diagnosis not present

## 2021-03-18 ENCOUNTER — Other Ambulatory Visit: Payer: Self-pay | Admitting: Nurse Practitioner

## 2021-03-19 ENCOUNTER — Other Ambulatory Visit: Payer: Self-pay | Admitting: Nurse Practitioner

## 2021-03-19 NOTE — Telephone Encounter (Signed)
Attempted to call patient multiple of times in regards to her starting insulin and for the patient to come in for teaching. However, unable to get in touch with her or leave a voicemail.

## 2021-03-19 NOTE — Progress Notes (Signed)
Attempted to call her to start tresiba. This would be a better option for her due to her heart failure. She needs teaching and would have to come into the office so we can show her.

## 2021-03-20 DIAGNOSIS — N2581 Secondary hyperparathyroidism of renal origin: Secondary | ICD-10-CM | POA: Diagnosis not present

## 2021-03-20 DIAGNOSIS — N186 End stage renal disease: Secondary | ICD-10-CM | POA: Diagnosis not present

## 2021-03-20 DIAGNOSIS — D689 Coagulation defect, unspecified: Secondary | ICD-10-CM | POA: Diagnosis not present

## 2021-03-20 DIAGNOSIS — E876 Hypokalemia: Secondary | ICD-10-CM | POA: Diagnosis not present

## 2021-03-20 DIAGNOSIS — R519 Headache, unspecified: Secondary | ICD-10-CM | POA: Diagnosis not present

## 2021-03-20 DIAGNOSIS — Z992 Dependence on renal dialysis: Secondary | ICD-10-CM | POA: Diagnosis not present

## 2021-03-22 DIAGNOSIS — E876 Hypokalemia: Secondary | ICD-10-CM | POA: Diagnosis not present

## 2021-03-22 DIAGNOSIS — N186 End stage renal disease: Secondary | ICD-10-CM | POA: Diagnosis not present

## 2021-03-22 DIAGNOSIS — N2581 Secondary hyperparathyroidism of renal origin: Secondary | ICD-10-CM | POA: Diagnosis not present

## 2021-03-22 DIAGNOSIS — D689 Coagulation defect, unspecified: Secondary | ICD-10-CM | POA: Diagnosis not present

## 2021-03-22 DIAGNOSIS — Z992 Dependence on renal dialysis: Secondary | ICD-10-CM | POA: Diagnosis not present

## 2021-03-22 DIAGNOSIS — R519 Headache, unspecified: Secondary | ICD-10-CM | POA: Diagnosis not present

## 2021-03-24 DIAGNOSIS — D689 Coagulation defect, unspecified: Secondary | ICD-10-CM | POA: Diagnosis not present

## 2021-03-24 DIAGNOSIS — N186 End stage renal disease: Secondary | ICD-10-CM | POA: Diagnosis not present

## 2021-03-24 DIAGNOSIS — Z992 Dependence on renal dialysis: Secondary | ICD-10-CM | POA: Diagnosis not present

## 2021-03-24 DIAGNOSIS — N2581 Secondary hyperparathyroidism of renal origin: Secondary | ICD-10-CM | POA: Diagnosis not present

## 2021-03-24 DIAGNOSIS — E876 Hypokalemia: Secondary | ICD-10-CM | POA: Diagnosis not present

## 2021-03-24 DIAGNOSIS — R519 Headache, unspecified: Secondary | ICD-10-CM | POA: Diagnosis not present

## 2021-03-27 DIAGNOSIS — Z992 Dependence on renal dialysis: Secondary | ICD-10-CM | POA: Diagnosis not present

## 2021-03-27 DIAGNOSIS — R519 Headache, unspecified: Secondary | ICD-10-CM | POA: Diagnosis not present

## 2021-03-27 DIAGNOSIS — D689 Coagulation defect, unspecified: Secondary | ICD-10-CM | POA: Diagnosis not present

## 2021-03-27 DIAGNOSIS — N2581 Secondary hyperparathyroidism of renal origin: Secondary | ICD-10-CM | POA: Diagnosis not present

## 2021-03-27 DIAGNOSIS — N186 End stage renal disease: Secondary | ICD-10-CM | POA: Diagnosis not present

## 2021-03-29 DIAGNOSIS — N2581 Secondary hyperparathyroidism of renal origin: Secondary | ICD-10-CM | POA: Diagnosis not present

## 2021-03-29 DIAGNOSIS — Z992 Dependence on renal dialysis: Secondary | ICD-10-CM | POA: Diagnosis not present

## 2021-03-29 DIAGNOSIS — R519 Headache, unspecified: Secondary | ICD-10-CM | POA: Diagnosis not present

## 2021-03-29 DIAGNOSIS — N186 End stage renal disease: Secondary | ICD-10-CM | POA: Diagnosis not present

## 2021-03-29 DIAGNOSIS — D689 Coagulation defect, unspecified: Secondary | ICD-10-CM | POA: Diagnosis not present

## 2021-03-31 ENCOUNTER — Other Ambulatory Visit: Payer: Self-pay | Admitting: Cardiology

## 2021-03-31 DIAGNOSIS — N186 End stage renal disease: Secondary | ICD-10-CM | POA: Diagnosis not present

## 2021-03-31 DIAGNOSIS — E1129 Type 2 diabetes mellitus with other diabetic kidney complication: Secondary | ICD-10-CM | POA: Diagnosis not present

## 2021-03-31 DIAGNOSIS — N2581 Secondary hyperparathyroidism of renal origin: Secondary | ICD-10-CM | POA: Diagnosis not present

## 2021-03-31 DIAGNOSIS — R519 Headache, unspecified: Secondary | ICD-10-CM | POA: Diagnosis not present

## 2021-03-31 DIAGNOSIS — D689 Coagulation defect, unspecified: Secondary | ICD-10-CM | POA: Diagnosis not present

## 2021-03-31 DIAGNOSIS — Z992 Dependence on renal dialysis: Secondary | ICD-10-CM | POA: Diagnosis not present

## 2021-04-02 ENCOUNTER — Other Ambulatory Visit: Payer: Self-pay

## 2021-04-02 MED ORDER — METOCLOPRAMIDE HCL 10 MG PO TABS: ORAL_TABLET | ORAL | 0 refills | Status: DC

## 2021-04-03 DIAGNOSIS — Z992 Dependence on renal dialysis: Secondary | ICD-10-CM | POA: Diagnosis not present

## 2021-04-03 DIAGNOSIS — N186 End stage renal disease: Secondary | ICD-10-CM | POA: Diagnosis not present

## 2021-04-03 DIAGNOSIS — D689 Coagulation defect, unspecified: Secondary | ICD-10-CM | POA: Diagnosis not present

## 2021-04-03 DIAGNOSIS — N2581 Secondary hyperparathyroidism of renal origin: Secondary | ICD-10-CM | POA: Diagnosis not present

## 2021-04-05 DIAGNOSIS — N2581 Secondary hyperparathyroidism of renal origin: Secondary | ICD-10-CM | POA: Diagnosis not present

## 2021-04-05 DIAGNOSIS — Z992 Dependence on renal dialysis: Secondary | ICD-10-CM | POA: Diagnosis not present

## 2021-04-05 DIAGNOSIS — N186 End stage renal disease: Secondary | ICD-10-CM | POA: Diagnosis not present

## 2021-04-05 DIAGNOSIS — D689 Coagulation defect, unspecified: Secondary | ICD-10-CM | POA: Diagnosis not present

## 2021-04-07 DIAGNOSIS — N2581 Secondary hyperparathyroidism of renal origin: Secondary | ICD-10-CM | POA: Diagnosis not present

## 2021-04-07 DIAGNOSIS — Z992 Dependence on renal dialysis: Secondary | ICD-10-CM | POA: Diagnosis not present

## 2021-04-07 DIAGNOSIS — N186 End stage renal disease: Secondary | ICD-10-CM | POA: Diagnosis not present

## 2021-04-07 DIAGNOSIS — D689 Coagulation defect, unspecified: Secondary | ICD-10-CM | POA: Diagnosis not present

## 2021-04-10 DIAGNOSIS — N186 End stage renal disease: Secondary | ICD-10-CM | POA: Diagnosis not present

## 2021-04-10 DIAGNOSIS — Z992 Dependence on renal dialysis: Secondary | ICD-10-CM | POA: Diagnosis not present

## 2021-04-10 DIAGNOSIS — D689 Coagulation defect, unspecified: Secondary | ICD-10-CM | POA: Diagnosis not present

## 2021-04-10 DIAGNOSIS — N2581 Secondary hyperparathyroidism of renal origin: Secondary | ICD-10-CM | POA: Diagnosis not present

## 2021-04-10 DIAGNOSIS — R519 Headache, unspecified: Secondary | ICD-10-CM | POA: Diagnosis not present

## 2021-04-12 ENCOUNTER — Other Ambulatory Visit: Payer: Self-pay | Admitting: Cardiology

## 2021-04-12 DIAGNOSIS — Z992 Dependence on renal dialysis: Secondary | ICD-10-CM | POA: Diagnosis not present

## 2021-04-12 DIAGNOSIS — N2581 Secondary hyperparathyroidism of renal origin: Secondary | ICD-10-CM | POA: Diagnosis not present

## 2021-04-12 DIAGNOSIS — R519 Headache, unspecified: Secondary | ICD-10-CM | POA: Diagnosis not present

## 2021-04-12 DIAGNOSIS — N186 End stage renal disease: Secondary | ICD-10-CM | POA: Diagnosis not present

## 2021-04-12 DIAGNOSIS — D689 Coagulation defect, unspecified: Secondary | ICD-10-CM | POA: Diagnosis not present

## 2021-04-14 DIAGNOSIS — N2581 Secondary hyperparathyroidism of renal origin: Secondary | ICD-10-CM | POA: Diagnosis not present

## 2021-04-14 DIAGNOSIS — R519 Headache, unspecified: Secondary | ICD-10-CM | POA: Diagnosis not present

## 2021-04-14 DIAGNOSIS — N186 End stage renal disease: Secondary | ICD-10-CM | POA: Diagnosis not present

## 2021-04-14 DIAGNOSIS — Z992 Dependence on renal dialysis: Secondary | ICD-10-CM | POA: Diagnosis not present

## 2021-04-14 DIAGNOSIS — D689 Coagulation defect, unspecified: Secondary | ICD-10-CM | POA: Diagnosis not present

## 2021-04-17 DIAGNOSIS — D689 Coagulation defect, unspecified: Secondary | ICD-10-CM | POA: Diagnosis not present

## 2021-04-17 DIAGNOSIS — N186 End stage renal disease: Secondary | ICD-10-CM | POA: Diagnosis not present

## 2021-04-17 DIAGNOSIS — Z992 Dependence on renal dialysis: Secondary | ICD-10-CM | POA: Diagnosis not present

## 2021-04-17 DIAGNOSIS — N2581 Secondary hyperparathyroidism of renal origin: Secondary | ICD-10-CM | POA: Diagnosis not present

## 2021-04-17 DIAGNOSIS — R519 Headache, unspecified: Secondary | ICD-10-CM | POA: Diagnosis not present

## 2021-04-17 DIAGNOSIS — E876 Hypokalemia: Secondary | ICD-10-CM | POA: Diagnosis not present

## 2021-04-19 ENCOUNTER — Other Ambulatory Visit: Payer: Self-pay | Admitting: Cardiology

## 2021-04-19 DIAGNOSIS — R519 Headache, unspecified: Secondary | ICD-10-CM | POA: Diagnosis not present

## 2021-04-19 DIAGNOSIS — N2581 Secondary hyperparathyroidism of renal origin: Secondary | ICD-10-CM | POA: Diagnosis not present

## 2021-04-19 DIAGNOSIS — N186 End stage renal disease: Secondary | ICD-10-CM | POA: Diagnosis not present

## 2021-04-19 DIAGNOSIS — E876 Hypokalemia: Secondary | ICD-10-CM | POA: Diagnosis not present

## 2021-04-19 DIAGNOSIS — D689 Coagulation defect, unspecified: Secondary | ICD-10-CM | POA: Diagnosis not present

## 2021-04-19 DIAGNOSIS — Z992 Dependence on renal dialysis: Secondary | ICD-10-CM | POA: Diagnosis not present

## 2021-04-21 DIAGNOSIS — D689 Coagulation defect, unspecified: Secondary | ICD-10-CM | POA: Diagnosis not present

## 2021-04-21 DIAGNOSIS — R519 Headache, unspecified: Secondary | ICD-10-CM | POA: Diagnosis not present

## 2021-04-21 DIAGNOSIS — E876 Hypokalemia: Secondary | ICD-10-CM | POA: Diagnosis not present

## 2021-04-21 DIAGNOSIS — N186 End stage renal disease: Secondary | ICD-10-CM | POA: Diagnosis not present

## 2021-04-21 DIAGNOSIS — Z992 Dependence on renal dialysis: Secondary | ICD-10-CM | POA: Diagnosis not present

## 2021-04-21 DIAGNOSIS — N2581 Secondary hyperparathyroidism of renal origin: Secondary | ICD-10-CM | POA: Diagnosis not present

## 2021-04-24 DIAGNOSIS — N2581 Secondary hyperparathyroidism of renal origin: Secondary | ICD-10-CM | POA: Diagnosis not present

## 2021-04-24 DIAGNOSIS — R519 Headache, unspecified: Secondary | ICD-10-CM | POA: Diagnosis not present

## 2021-04-24 DIAGNOSIS — Z992 Dependence on renal dialysis: Secondary | ICD-10-CM | POA: Diagnosis not present

## 2021-04-24 DIAGNOSIS — N186 End stage renal disease: Secondary | ICD-10-CM | POA: Diagnosis not present

## 2021-04-24 DIAGNOSIS — D689 Coagulation defect, unspecified: Secondary | ICD-10-CM | POA: Diagnosis not present

## 2021-04-26 DIAGNOSIS — N2581 Secondary hyperparathyroidism of renal origin: Secondary | ICD-10-CM | POA: Diagnosis not present

## 2021-04-26 DIAGNOSIS — Z992 Dependence on renal dialysis: Secondary | ICD-10-CM | POA: Diagnosis not present

## 2021-04-26 DIAGNOSIS — R519 Headache, unspecified: Secondary | ICD-10-CM | POA: Diagnosis not present

## 2021-04-26 DIAGNOSIS — D689 Coagulation defect, unspecified: Secondary | ICD-10-CM | POA: Diagnosis not present

## 2021-04-26 DIAGNOSIS — N186 End stage renal disease: Secondary | ICD-10-CM | POA: Diagnosis not present

## 2021-04-28 ENCOUNTER — Other Ambulatory Visit: Payer: Self-pay | Admitting: Cardiology

## 2021-04-28 DIAGNOSIS — N2581 Secondary hyperparathyroidism of renal origin: Secondary | ICD-10-CM | POA: Diagnosis not present

## 2021-04-28 DIAGNOSIS — N186 End stage renal disease: Secondary | ICD-10-CM | POA: Diagnosis not present

## 2021-04-28 DIAGNOSIS — R519 Headache, unspecified: Secondary | ICD-10-CM | POA: Diagnosis not present

## 2021-04-28 DIAGNOSIS — Z992 Dependence on renal dialysis: Secondary | ICD-10-CM | POA: Diagnosis not present

## 2021-04-28 DIAGNOSIS — D689 Coagulation defect, unspecified: Secondary | ICD-10-CM | POA: Diagnosis not present

## 2021-05-01 DIAGNOSIS — Z992 Dependence on renal dialysis: Secondary | ICD-10-CM | POA: Diagnosis not present

## 2021-05-01 DIAGNOSIS — E1129 Type 2 diabetes mellitus with other diabetic kidney complication: Secondary | ICD-10-CM | POA: Diagnosis not present

## 2021-05-01 DIAGNOSIS — N186 End stage renal disease: Secondary | ICD-10-CM | POA: Diagnosis not present

## 2021-05-01 DIAGNOSIS — N2581 Secondary hyperparathyroidism of renal origin: Secondary | ICD-10-CM | POA: Diagnosis not present

## 2021-05-01 DIAGNOSIS — D689 Coagulation defect, unspecified: Secondary | ICD-10-CM | POA: Diagnosis not present

## 2021-05-03 DIAGNOSIS — Z992 Dependence on renal dialysis: Secondary | ICD-10-CM | POA: Diagnosis not present

## 2021-05-03 DIAGNOSIS — N186 End stage renal disease: Secondary | ICD-10-CM | POA: Diagnosis not present

## 2021-05-03 DIAGNOSIS — N2581 Secondary hyperparathyroidism of renal origin: Secondary | ICD-10-CM | POA: Diagnosis not present

## 2021-05-03 DIAGNOSIS — R519 Headache, unspecified: Secondary | ICD-10-CM | POA: Diagnosis not present

## 2021-05-03 DIAGNOSIS — D689 Coagulation defect, unspecified: Secondary | ICD-10-CM | POA: Diagnosis not present

## 2021-05-03 DIAGNOSIS — Z23 Encounter for immunization: Secondary | ICD-10-CM | POA: Diagnosis not present

## 2021-05-05 ENCOUNTER — Other Ambulatory Visit: Payer: Self-pay | Admitting: Cardiology

## 2021-05-05 DIAGNOSIS — Z23 Encounter for immunization: Secondary | ICD-10-CM | POA: Diagnosis not present

## 2021-05-05 DIAGNOSIS — D689 Coagulation defect, unspecified: Secondary | ICD-10-CM | POA: Diagnosis not present

## 2021-05-05 DIAGNOSIS — N2581 Secondary hyperparathyroidism of renal origin: Secondary | ICD-10-CM | POA: Diagnosis not present

## 2021-05-05 DIAGNOSIS — Z992 Dependence on renal dialysis: Secondary | ICD-10-CM | POA: Diagnosis not present

## 2021-05-05 DIAGNOSIS — R519 Headache, unspecified: Secondary | ICD-10-CM | POA: Diagnosis not present

## 2021-05-05 DIAGNOSIS — N186 End stage renal disease: Secondary | ICD-10-CM | POA: Diagnosis not present

## 2021-05-08 DIAGNOSIS — R519 Headache, unspecified: Secondary | ICD-10-CM | POA: Diagnosis not present

## 2021-05-08 DIAGNOSIS — N2581 Secondary hyperparathyroidism of renal origin: Secondary | ICD-10-CM | POA: Diagnosis not present

## 2021-05-08 DIAGNOSIS — D689 Coagulation defect, unspecified: Secondary | ICD-10-CM | POA: Diagnosis not present

## 2021-05-08 DIAGNOSIS — N186 End stage renal disease: Secondary | ICD-10-CM | POA: Diagnosis not present

## 2021-05-08 DIAGNOSIS — Z992 Dependence on renal dialysis: Secondary | ICD-10-CM | POA: Diagnosis not present

## 2021-05-10 DIAGNOSIS — D689 Coagulation defect, unspecified: Secondary | ICD-10-CM | POA: Diagnosis not present

## 2021-05-10 DIAGNOSIS — N186 End stage renal disease: Secondary | ICD-10-CM | POA: Diagnosis not present

## 2021-05-10 DIAGNOSIS — R519 Headache, unspecified: Secondary | ICD-10-CM | POA: Diagnosis not present

## 2021-05-10 DIAGNOSIS — N2581 Secondary hyperparathyroidism of renal origin: Secondary | ICD-10-CM | POA: Diagnosis not present

## 2021-05-10 DIAGNOSIS — Z992 Dependence on renal dialysis: Secondary | ICD-10-CM | POA: Diagnosis not present

## 2021-05-12 DIAGNOSIS — R519 Headache, unspecified: Secondary | ICD-10-CM | POA: Diagnosis not present

## 2021-05-12 DIAGNOSIS — N186 End stage renal disease: Secondary | ICD-10-CM | POA: Diagnosis not present

## 2021-05-12 DIAGNOSIS — Z992 Dependence on renal dialysis: Secondary | ICD-10-CM | POA: Diagnosis not present

## 2021-05-12 DIAGNOSIS — D689 Coagulation defect, unspecified: Secondary | ICD-10-CM | POA: Diagnosis not present

## 2021-05-12 DIAGNOSIS — N2581 Secondary hyperparathyroidism of renal origin: Secondary | ICD-10-CM | POA: Diagnosis not present

## 2021-05-15 DIAGNOSIS — R519 Headache, unspecified: Secondary | ICD-10-CM | POA: Diagnosis not present

## 2021-05-15 DIAGNOSIS — Z992 Dependence on renal dialysis: Secondary | ICD-10-CM | POA: Diagnosis not present

## 2021-05-15 DIAGNOSIS — D689 Coagulation defect, unspecified: Secondary | ICD-10-CM | POA: Diagnosis not present

## 2021-05-15 DIAGNOSIS — E876 Hypokalemia: Secondary | ICD-10-CM | POA: Diagnosis not present

## 2021-05-15 DIAGNOSIS — N186 End stage renal disease: Secondary | ICD-10-CM | POA: Diagnosis not present

## 2021-05-15 DIAGNOSIS — N2581 Secondary hyperparathyroidism of renal origin: Secondary | ICD-10-CM | POA: Diagnosis not present

## 2021-05-17 DIAGNOSIS — N186 End stage renal disease: Secondary | ICD-10-CM | POA: Diagnosis not present

## 2021-05-17 DIAGNOSIS — N2581 Secondary hyperparathyroidism of renal origin: Secondary | ICD-10-CM | POA: Diagnosis not present

## 2021-05-17 DIAGNOSIS — Z992 Dependence on renal dialysis: Secondary | ICD-10-CM | POA: Diagnosis not present

## 2021-05-17 DIAGNOSIS — D689 Coagulation defect, unspecified: Secondary | ICD-10-CM | POA: Diagnosis not present

## 2021-05-17 DIAGNOSIS — E876 Hypokalemia: Secondary | ICD-10-CM | POA: Diagnosis not present

## 2021-05-17 DIAGNOSIS — R519 Headache, unspecified: Secondary | ICD-10-CM | POA: Diagnosis not present

## 2021-05-19 DIAGNOSIS — N2581 Secondary hyperparathyroidism of renal origin: Secondary | ICD-10-CM | POA: Diagnosis not present

## 2021-05-19 DIAGNOSIS — Z992 Dependence on renal dialysis: Secondary | ICD-10-CM | POA: Diagnosis not present

## 2021-05-19 DIAGNOSIS — N186 End stage renal disease: Secondary | ICD-10-CM | POA: Diagnosis not present

## 2021-05-19 DIAGNOSIS — D689 Coagulation defect, unspecified: Secondary | ICD-10-CM | POA: Diagnosis not present

## 2021-05-19 DIAGNOSIS — E876 Hypokalemia: Secondary | ICD-10-CM | POA: Diagnosis not present

## 2021-05-19 DIAGNOSIS — R519 Headache, unspecified: Secondary | ICD-10-CM | POA: Diagnosis not present

## 2021-05-22 DIAGNOSIS — Z992 Dependence on renal dialysis: Secondary | ICD-10-CM | POA: Diagnosis not present

## 2021-05-22 DIAGNOSIS — R519 Headache, unspecified: Secondary | ICD-10-CM | POA: Diagnosis not present

## 2021-05-22 DIAGNOSIS — N186 End stage renal disease: Secondary | ICD-10-CM | POA: Diagnosis not present

## 2021-05-22 DIAGNOSIS — D689 Coagulation defect, unspecified: Secondary | ICD-10-CM | POA: Diagnosis not present

## 2021-05-22 DIAGNOSIS — N2581 Secondary hyperparathyroidism of renal origin: Secondary | ICD-10-CM | POA: Diagnosis not present

## 2021-05-24 DIAGNOSIS — N186 End stage renal disease: Secondary | ICD-10-CM | POA: Diagnosis not present

## 2021-05-24 DIAGNOSIS — N2581 Secondary hyperparathyroidism of renal origin: Secondary | ICD-10-CM | POA: Diagnosis not present

## 2021-05-24 DIAGNOSIS — R519 Headache, unspecified: Secondary | ICD-10-CM | POA: Diagnosis not present

## 2021-05-24 DIAGNOSIS — D689 Coagulation defect, unspecified: Secondary | ICD-10-CM | POA: Diagnosis not present

## 2021-05-24 DIAGNOSIS — Z992 Dependence on renal dialysis: Secondary | ICD-10-CM | POA: Diagnosis not present

## 2021-05-26 DIAGNOSIS — Z992 Dependence on renal dialysis: Secondary | ICD-10-CM | POA: Diagnosis not present

## 2021-05-26 DIAGNOSIS — N186 End stage renal disease: Secondary | ICD-10-CM | POA: Diagnosis not present

## 2021-05-26 DIAGNOSIS — N2581 Secondary hyperparathyroidism of renal origin: Secondary | ICD-10-CM | POA: Diagnosis not present

## 2021-05-26 DIAGNOSIS — R519 Headache, unspecified: Secondary | ICD-10-CM | POA: Diagnosis not present

## 2021-05-26 DIAGNOSIS — D689 Coagulation defect, unspecified: Secondary | ICD-10-CM | POA: Diagnosis not present

## 2021-05-29 ENCOUNTER — Other Ambulatory Visit: Payer: Self-pay | Admitting: Cardiology

## 2021-05-29 ENCOUNTER — Other Ambulatory Visit: Payer: Self-pay | Admitting: Nurse Practitioner

## 2021-05-29 DIAGNOSIS — Z992 Dependence on renal dialysis: Secondary | ICD-10-CM | POA: Diagnosis not present

## 2021-05-29 DIAGNOSIS — D689 Coagulation defect, unspecified: Secondary | ICD-10-CM | POA: Diagnosis not present

## 2021-05-29 DIAGNOSIS — N2581 Secondary hyperparathyroidism of renal origin: Secondary | ICD-10-CM | POA: Diagnosis not present

## 2021-05-29 DIAGNOSIS — N186 End stage renal disease: Secondary | ICD-10-CM | POA: Diagnosis not present

## 2021-05-29 DIAGNOSIS — E876 Hypokalemia: Secondary | ICD-10-CM | POA: Diagnosis not present

## 2021-05-30 DIAGNOSIS — E876 Hypokalemia: Secondary | ICD-10-CM | POA: Diagnosis not present

## 2021-05-30 DIAGNOSIS — D689 Coagulation defect, unspecified: Secondary | ICD-10-CM | POA: Diagnosis not present

## 2021-05-30 DIAGNOSIS — Z992 Dependence on renal dialysis: Secondary | ICD-10-CM | POA: Diagnosis not present

## 2021-05-30 DIAGNOSIS — N186 End stage renal disease: Secondary | ICD-10-CM | POA: Diagnosis not present

## 2021-05-30 DIAGNOSIS — N2581 Secondary hyperparathyroidism of renal origin: Secondary | ICD-10-CM | POA: Diagnosis not present

## 2021-05-31 DIAGNOSIS — Z992 Dependence on renal dialysis: Secondary | ICD-10-CM | POA: Diagnosis not present

## 2021-05-31 DIAGNOSIS — E1129 Type 2 diabetes mellitus with other diabetic kidney complication: Secondary | ICD-10-CM | POA: Diagnosis not present

## 2021-05-31 DIAGNOSIS — N186 End stage renal disease: Secondary | ICD-10-CM | POA: Diagnosis not present

## 2021-05-31 DIAGNOSIS — E876 Hypokalemia: Secondary | ICD-10-CM | POA: Diagnosis not present

## 2021-05-31 DIAGNOSIS — D689 Coagulation defect, unspecified: Secondary | ICD-10-CM | POA: Diagnosis not present

## 2021-05-31 DIAGNOSIS — N2581 Secondary hyperparathyroidism of renal origin: Secondary | ICD-10-CM | POA: Diagnosis not present

## 2021-06-02 DIAGNOSIS — D689 Coagulation defect, unspecified: Secondary | ICD-10-CM | POA: Diagnosis not present

## 2021-06-02 DIAGNOSIS — R519 Headache, unspecified: Secondary | ICD-10-CM | POA: Diagnosis not present

## 2021-06-02 DIAGNOSIS — E876 Hypokalemia: Secondary | ICD-10-CM | POA: Diagnosis not present

## 2021-06-02 DIAGNOSIS — E1129 Type 2 diabetes mellitus with other diabetic kidney complication: Secondary | ICD-10-CM | POA: Diagnosis not present

## 2021-06-02 DIAGNOSIS — Z992 Dependence on renal dialysis: Secondary | ICD-10-CM | POA: Diagnosis not present

## 2021-06-02 DIAGNOSIS — N2581 Secondary hyperparathyroidism of renal origin: Secondary | ICD-10-CM | POA: Diagnosis not present

## 2021-06-02 DIAGNOSIS — N186 End stage renal disease: Secondary | ICD-10-CM | POA: Diagnosis not present

## 2021-06-05 DIAGNOSIS — Z992 Dependence on renal dialysis: Secondary | ICD-10-CM | POA: Diagnosis not present

## 2021-06-05 DIAGNOSIS — N186 End stage renal disease: Secondary | ICD-10-CM | POA: Diagnosis not present

## 2021-06-05 DIAGNOSIS — E1129 Type 2 diabetes mellitus with other diabetic kidney complication: Secondary | ICD-10-CM | POA: Diagnosis not present

## 2021-06-05 DIAGNOSIS — R519 Headache, unspecified: Secondary | ICD-10-CM | POA: Diagnosis not present

## 2021-06-05 DIAGNOSIS — E876 Hypokalemia: Secondary | ICD-10-CM | POA: Diagnosis not present

## 2021-06-05 DIAGNOSIS — N2581 Secondary hyperparathyroidism of renal origin: Secondary | ICD-10-CM | POA: Diagnosis not present

## 2021-06-05 DIAGNOSIS — D689 Coagulation defect, unspecified: Secondary | ICD-10-CM | POA: Diagnosis not present

## 2021-06-06 ENCOUNTER — Ambulatory Visit: Payer: BC Managed Care – PPO | Admitting: Nurse Practitioner

## 2021-06-07 DIAGNOSIS — E1129 Type 2 diabetes mellitus with other diabetic kidney complication: Secondary | ICD-10-CM | POA: Diagnosis not present

## 2021-06-07 DIAGNOSIS — N2581 Secondary hyperparathyroidism of renal origin: Secondary | ICD-10-CM | POA: Diagnosis not present

## 2021-06-07 DIAGNOSIS — Z992 Dependence on renal dialysis: Secondary | ICD-10-CM | POA: Diagnosis not present

## 2021-06-07 DIAGNOSIS — N186 End stage renal disease: Secondary | ICD-10-CM | POA: Diagnosis not present

## 2021-06-07 DIAGNOSIS — E876 Hypokalemia: Secondary | ICD-10-CM | POA: Diagnosis not present

## 2021-06-07 DIAGNOSIS — D689 Coagulation defect, unspecified: Secondary | ICD-10-CM | POA: Diagnosis not present

## 2021-06-07 DIAGNOSIS — R519 Headache, unspecified: Secondary | ICD-10-CM | POA: Diagnosis not present

## 2021-06-09 DIAGNOSIS — D689 Coagulation defect, unspecified: Secondary | ICD-10-CM | POA: Diagnosis not present

## 2021-06-09 DIAGNOSIS — R519 Headache, unspecified: Secondary | ICD-10-CM | POA: Diagnosis not present

## 2021-06-09 DIAGNOSIS — N186 End stage renal disease: Secondary | ICD-10-CM | POA: Diagnosis not present

## 2021-06-09 DIAGNOSIS — Z992 Dependence on renal dialysis: Secondary | ICD-10-CM | POA: Diagnosis not present

## 2021-06-09 DIAGNOSIS — N2581 Secondary hyperparathyroidism of renal origin: Secondary | ICD-10-CM | POA: Diagnosis not present

## 2021-06-09 DIAGNOSIS — E1129 Type 2 diabetes mellitus with other diabetic kidney complication: Secondary | ICD-10-CM | POA: Diagnosis not present

## 2021-06-09 DIAGNOSIS — E876 Hypokalemia: Secondary | ICD-10-CM | POA: Diagnosis not present

## 2021-06-12 DIAGNOSIS — Z992 Dependence on renal dialysis: Secondary | ICD-10-CM | POA: Diagnosis not present

## 2021-06-12 DIAGNOSIS — D689 Coagulation defect, unspecified: Secondary | ICD-10-CM | POA: Diagnosis not present

## 2021-06-12 DIAGNOSIS — E876 Hypokalemia: Secondary | ICD-10-CM | POA: Diagnosis not present

## 2021-06-12 DIAGNOSIS — N2581 Secondary hyperparathyroidism of renal origin: Secondary | ICD-10-CM | POA: Diagnosis not present

## 2021-06-12 DIAGNOSIS — E1129 Type 2 diabetes mellitus with other diabetic kidney complication: Secondary | ICD-10-CM | POA: Diagnosis not present

## 2021-06-12 DIAGNOSIS — N186 End stage renal disease: Secondary | ICD-10-CM | POA: Diagnosis not present

## 2021-06-12 DIAGNOSIS — R519 Headache, unspecified: Secondary | ICD-10-CM | POA: Diagnosis not present

## 2021-06-12 LAB — COMPREHENSIVE METABOLIC PANEL
Albumin: 3.8 (ref 3.5–5.0)
Calcium: 8.2 — AB (ref 8.7–10.7)

## 2021-06-12 LAB — HEMOGLOBIN A1C: Hemoglobin A1C: 6.6

## 2021-06-12 LAB — CBC AND DIFFERENTIAL: Hemoglobin: 12.6 (ref 12.0–16.0)

## 2021-06-14 ENCOUNTER — Ambulatory Visit: Payer: Medicare Other | Admitting: Nurse Practitioner

## 2021-06-14 DIAGNOSIS — E1129 Type 2 diabetes mellitus with other diabetic kidney complication: Secondary | ICD-10-CM | POA: Diagnosis not present

## 2021-06-14 DIAGNOSIS — N2581 Secondary hyperparathyroidism of renal origin: Secondary | ICD-10-CM | POA: Diagnosis not present

## 2021-06-14 DIAGNOSIS — N186 End stage renal disease: Secondary | ICD-10-CM | POA: Diagnosis not present

## 2021-06-14 DIAGNOSIS — Z992 Dependence on renal dialysis: Secondary | ICD-10-CM | POA: Diagnosis not present

## 2021-06-14 DIAGNOSIS — R519 Headache, unspecified: Secondary | ICD-10-CM | POA: Diagnosis not present

## 2021-06-14 DIAGNOSIS — E876 Hypokalemia: Secondary | ICD-10-CM | POA: Diagnosis not present

## 2021-06-14 DIAGNOSIS — D689 Coagulation defect, unspecified: Secondary | ICD-10-CM | POA: Diagnosis not present

## 2021-06-16 DIAGNOSIS — R519 Headache, unspecified: Secondary | ICD-10-CM | POA: Diagnosis not present

## 2021-06-16 DIAGNOSIS — E876 Hypokalemia: Secondary | ICD-10-CM | POA: Diagnosis not present

## 2021-06-16 DIAGNOSIS — N2581 Secondary hyperparathyroidism of renal origin: Secondary | ICD-10-CM | POA: Diagnosis not present

## 2021-06-16 DIAGNOSIS — N186 End stage renal disease: Secondary | ICD-10-CM | POA: Diagnosis not present

## 2021-06-16 DIAGNOSIS — E1129 Type 2 diabetes mellitus with other diabetic kidney complication: Secondary | ICD-10-CM | POA: Diagnosis not present

## 2021-06-16 DIAGNOSIS — Z992 Dependence on renal dialysis: Secondary | ICD-10-CM | POA: Diagnosis not present

## 2021-06-16 DIAGNOSIS — D689 Coagulation defect, unspecified: Secondary | ICD-10-CM | POA: Diagnosis not present

## 2021-06-19 DIAGNOSIS — R519 Headache, unspecified: Secondary | ICD-10-CM | POA: Diagnosis not present

## 2021-06-19 DIAGNOSIS — Z992 Dependence on renal dialysis: Secondary | ICD-10-CM | POA: Diagnosis not present

## 2021-06-19 DIAGNOSIS — N2581 Secondary hyperparathyroidism of renal origin: Secondary | ICD-10-CM | POA: Diagnosis not present

## 2021-06-19 DIAGNOSIS — D689 Coagulation defect, unspecified: Secondary | ICD-10-CM | POA: Diagnosis not present

## 2021-06-19 DIAGNOSIS — N186 End stage renal disease: Secondary | ICD-10-CM | POA: Diagnosis not present

## 2021-06-19 DIAGNOSIS — E1129 Type 2 diabetes mellitus with other diabetic kidney complication: Secondary | ICD-10-CM | POA: Diagnosis not present

## 2021-06-19 DIAGNOSIS — E876 Hypokalemia: Secondary | ICD-10-CM | POA: Diagnosis not present

## 2021-06-21 DIAGNOSIS — R519 Headache, unspecified: Secondary | ICD-10-CM | POA: Diagnosis not present

## 2021-06-21 DIAGNOSIS — N2581 Secondary hyperparathyroidism of renal origin: Secondary | ICD-10-CM | POA: Diagnosis not present

## 2021-06-21 DIAGNOSIS — E876 Hypokalemia: Secondary | ICD-10-CM | POA: Diagnosis not present

## 2021-06-21 DIAGNOSIS — N186 End stage renal disease: Secondary | ICD-10-CM | POA: Diagnosis not present

## 2021-06-21 DIAGNOSIS — Z992 Dependence on renal dialysis: Secondary | ICD-10-CM | POA: Diagnosis not present

## 2021-06-21 DIAGNOSIS — D689 Coagulation defect, unspecified: Secondary | ICD-10-CM | POA: Diagnosis not present

## 2021-06-21 DIAGNOSIS — E1129 Type 2 diabetes mellitus with other diabetic kidney complication: Secondary | ICD-10-CM | POA: Diagnosis not present

## 2021-06-23 DIAGNOSIS — N186 End stage renal disease: Secondary | ICD-10-CM | POA: Diagnosis not present

## 2021-06-23 DIAGNOSIS — R519 Headache, unspecified: Secondary | ICD-10-CM | POA: Diagnosis not present

## 2021-06-23 DIAGNOSIS — N2581 Secondary hyperparathyroidism of renal origin: Secondary | ICD-10-CM | POA: Diagnosis not present

## 2021-06-23 DIAGNOSIS — D689 Coagulation defect, unspecified: Secondary | ICD-10-CM | POA: Diagnosis not present

## 2021-06-23 DIAGNOSIS — E1129 Type 2 diabetes mellitus with other diabetic kidney complication: Secondary | ICD-10-CM | POA: Diagnosis not present

## 2021-06-23 DIAGNOSIS — Z992 Dependence on renal dialysis: Secondary | ICD-10-CM | POA: Diagnosis not present

## 2021-06-23 DIAGNOSIS — E876 Hypokalemia: Secondary | ICD-10-CM | POA: Diagnosis not present

## 2021-06-26 DIAGNOSIS — E1129 Type 2 diabetes mellitus with other diabetic kidney complication: Secondary | ICD-10-CM | POA: Diagnosis not present

## 2021-06-26 DIAGNOSIS — R519 Headache, unspecified: Secondary | ICD-10-CM | POA: Diagnosis not present

## 2021-06-26 DIAGNOSIS — D689 Coagulation defect, unspecified: Secondary | ICD-10-CM | POA: Diagnosis not present

## 2021-06-26 DIAGNOSIS — N186 End stage renal disease: Secondary | ICD-10-CM | POA: Diagnosis not present

## 2021-06-26 DIAGNOSIS — N2581 Secondary hyperparathyroidism of renal origin: Secondary | ICD-10-CM | POA: Diagnosis not present

## 2021-06-26 DIAGNOSIS — E876 Hypokalemia: Secondary | ICD-10-CM | POA: Diagnosis not present

## 2021-06-26 DIAGNOSIS — Z992 Dependence on renal dialysis: Secondary | ICD-10-CM | POA: Diagnosis not present

## 2021-06-27 ENCOUNTER — Ambulatory Visit (INDEPENDENT_AMBULATORY_CARE_PROVIDER_SITE_OTHER): Payer: Medicare Other

## 2021-06-27 ENCOUNTER — Ambulatory Visit (INDEPENDENT_AMBULATORY_CARE_PROVIDER_SITE_OTHER): Payer: Medicare Other | Admitting: Nurse Practitioner

## 2021-06-27 ENCOUNTER — Encounter: Payer: Self-pay | Admitting: Nurse Practitioner

## 2021-06-27 ENCOUNTER — Other Ambulatory Visit: Payer: Self-pay

## 2021-06-27 VITALS — BP 100/50 | HR 81 | Temp 98.5°F | Ht 64.4 in | Wt 248.0 lb

## 2021-06-27 DIAGNOSIS — M25562 Pain in left knee: Secondary | ICD-10-CM

## 2021-06-27 DIAGNOSIS — E1129 Type 2 diabetes mellitus with other diabetic kidney complication: Secondary | ICD-10-CM

## 2021-06-27 DIAGNOSIS — N186 End stage renal disease: Secondary | ICD-10-CM | POA: Diagnosis not present

## 2021-06-27 DIAGNOSIS — I132 Hypertensive heart and chronic kidney disease with heart failure and with stage 5 chronic kidney disease, or end stage renal disease: Secondary | ICD-10-CM | POA: Diagnosis not present

## 2021-06-27 DIAGNOSIS — I130 Hypertensive heart and chronic kidney disease with heart failure and stage 1 through stage 4 chronic kidney disease, or unspecified chronic kidney disease: Secondary | ICD-10-CM

## 2021-06-27 DIAGNOSIS — G8929 Other chronic pain: Secondary | ICD-10-CM

## 2021-06-27 DIAGNOSIS — Z0000 Encounter for general adult medical examination without abnormal findings: Secondary | ICD-10-CM

## 2021-06-27 DIAGNOSIS — H6121 Impacted cerumen, right ear: Secondary | ICD-10-CM | POA: Diagnosis not present

## 2021-06-27 DIAGNOSIS — E782 Mixed hyperlipidemia: Secondary | ICD-10-CM | POA: Diagnosis not present

## 2021-06-27 DIAGNOSIS — Z6841 Body Mass Index (BMI) 40.0 and over, adult: Secondary | ICD-10-CM

## 2021-06-27 DIAGNOSIS — E662 Morbid (severe) obesity with alveolar hypoventilation: Secondary | ICD-10-CM

## 2021-06-27 DIAGNOSIS — R809 Proteinuria, unspecified: Secondary | ICD-10-CM | POA: Diagnosis not present

## 2021-06-27 DIAGNOSIS — Z992 Dependence on renal dialysis: Secondary | ICD-10-CM | POA: Diagnosis not present

## 2021-06-27 NOTE — Progress Notes (Addendum)
I,Yamilka Roman [employer] as a Education administrator for [employer], FNP.,have documented all relevant documentation on the behalf of [name], FNP,as directed by  [name], FNP while in the presence of [name], [place].  This visit occurred during the SARS-CoV-2 public health emergency.  Safety protocols were in place, including screening questions prior to the visit, additional usage of staff PPE, and extensive cleaning of exam room while observing appropriate contact time as indicated for disinfecting solutions.  Subjective:     Patient ID: RIGBY GROVE , female    DOB: 05-30-1946 , 75 y.o.   MRN: [id number]   Chief Complaint  Patient presents with   Diabetes   Hypertension    HPI  Here for blood pressure and diabetes f/u. Continues with dialysis on T, Th and Sat doing well.  She takes midodrine 2 tabs 4 am on the day of her dialysis and 2 more right before her dialysis and her blood pressure continues to drop. She currently does not have symptoms   Wt Readings from Last 3 Encounters: 06/27/21 : 248 lb 0.3 oz (112.5 kg) 06/27/21 : 248 lb (112.5 kg) 02/28/21 : 242 lb 12.8 oz (110.1 kg)   She also had her [facility] done with [facility]  Diabetes She presents for her follow-up diabetic visit. She has type 2 diabetes mellitus. There are no hypoglycemic associated symptoms. There are no diabetic associated symptoms. There are no hypoglycemic complications. There are no diabetic complications.  Hypertension This is a chronic problem. The current episode started more than 1 year ago. The problem is unchanged. The problem is controlled. Pertinent negatives include no anxiety. There are no associated agents to hypertension. Risk factors for coronary artery disease include obesity. Past treatments include diuretics. Compliance problems include exercise.  There is no history of angina. There is no history of chronic renal disease.    Past Medical History:  Diagnosis Date   Arthritis    "left knee"  (12/04/2016)   CAD (coronary artery disease)    a. 12/2016 NSTEMI/PCI: LM nl, LaD 20p/d, D1 20, OM2 20, OM3 20, RCA 2m(3.0x18 Resolute DES). EF 65%.   Chronic lower back pain    Diverticulosis    on CT   [facility] (12/[facility]   GERD (gastroesophageal reflux disease)    Hiatal hernia    Hyperlipidemia    Hypertension    a. 12/2016 labetalol d/c'd 2/2 hypotension in HD.   [place] [facility]    Obesity    Pneumonia    "couple times" (12/04/2016)   Renal insufficiency    Type II diabetes mellitus ([place]      Family History  Problem Relation Age of Onset   Diabetes Mother    Peripheral vascular disease Mother        amputation   CAD Father    Heart disease Father    Hypertension Father    Heart attack Father    CAD Sister    Cancer Brother        x 2 brothers, type unknown   Breast cancer Sister      Current Outpatient Medications:    aspirin EC 81 MG EC tablet, Take 1 tablet (81 mg total) by mouth daily., Disp: , Rfl:    AURYXIA 1 GM 210 MG(Fe) tablet, Take 420 mg by mouth 2 (two) times a day., Disp: , Rfl:    B Complex-C-Folic Acid (DIALYVITE 8Q000111Q 0.8 MG TABS, Take  1 tablet by mouth daily., Disp: , Rfl:    benzoyl peroxide (BENZOYL PEROXIDE) 5 % external liquid, Apply topically 2 (two) times daily., Disp: 142 g, Rfl: 12   diclofenac sodium (VOLTAREN) 1 % GEL, Apply 2 g topically 4 (four) times daily., Disp: 100 g, Rfl: 2   ezetimibe (ZETIA) 10 MG tablet, TAKE 1 TABLET BY MOUTH  DAILY  OVERDUE FOLLOW UP  APPOINTMENT NEEDED FOR  FURTHER REFILLS., Disp: 15 tablet, Rfl: 0   metoCLOPramide (REGLAN) 10 MG tablet, TAKE 1 TABLET BY MOUTH  TWICE DAILY 30 MINUTES  BEFORE MEALS, Disp: 120 tablet, Rfl: 0   midodrine (PROAMATINE) 10 MG tablet, Take 10 mg by mouth 3 (three) times a week. Dialysis days Tues, Thurs Saturday, Disp: , Rfl:    Omega-3 Fatty Acids (FISH OIL) 1000 MG CPDR, Take 1,000 mg by mouth daily. , Disp: , Rfl:     omeprazole (PRILOSEC) 20 MG capsule, TAKE 1 CAPSULE BY MOUTH EVERY DAY BEFORE A MEAL, Disp: 90 capsule, Rfl: 1   rosuvastatin (CRESTOR) 40 MG tablet, Take 1 tablet (40 mg total) by mouth daily. Please schedule appointment for future refills. 3rd & FINAL attempt!, Disp: 15 tablet, Rfl: 0   Vitamin D, Ergocalciferol, 50 MCG (2000 UT) CAPS, Take 2,000 Units by mouth daily., Disp: , Rfl:    Allergies  Allergen Reactions   Other Other (See Comments)   Sulfa Antibiotics Other (See Comments)   Sulfa Drugs Cross Reactors Other (See Comments)    Doesn't remember      Review of Systems  Constitutional: Negative.   Eyes:        She is having ear popping to her left ear.   Respiratory: Negative.    Cardiovascular: Negative.   Gastrointestinal: Negative.   Genitourinary:        Continues with dialysis T, Th, and Sat  Musculoskeletal:        Left knee pain and requiring help by her husband with lifting into the car  Neurological: Negative.   Psychiatric/Behavioral: Negative.      Today's Vitals   06/27/21 0947  BP: (!) 100/50  Pulse: 81  Temp: 98.5 F (36.9 C)  Weight: 248 lb 0.3 oz (112.5 kg)  Height: 5' 4.4" (1.636 m)   Body mass index is 42.04 kg/m.   Objective:  Physical Exam Vitals reviewed.  Constitutional:      General: She is not in acute distress.    Appearance: Normal appearance.  HENT:     Head: Normocephalic.     Right Ear: External ear normal. There is impacted cerumen (excess cerumen).     Left Ear: Tympanic membrane, ear canal and external ear normal.  Cardiovascular:     Rate and Rhythm: Normal rate and regular rhythm.     Pulses: Normal pulses.     Heart sounds: Normal heart sounds. No murmur heard. Pulmonary:     Effort: Pulmonary effort is normal. No respiratory distress.     Breath sounds: Normal breath sounds. No wheezing.  Musculoskeletal:        General: Tenderness (left lateral knee pain and positive drawer test) present.  Skin:    General: Skin is  warm and dry.     Capillary Refill: Capillary refill takes less than 2 seconds.  Neurological:     General: No focal deficit present.     Mental Status: She is alert and oriented to person, place, and time.     Cranial Nerves: No cranial nerve deficit.  Motor: No weakness.  Psychiatric:        Mood and Affect: Mood normal.        Behavior: Behavior normal.        Thought Content: Thought content normal.        Judgment: Judgment normal.        Assessment And Plan:     1. Type 2 diabetes mellitus with microalbuminuria, without long-term current use of insulin (HCC) Comments: Not currently on medications, may need insulin pending labs - AMB Referral to [place]  2. 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 Mixed hyperlipidemia Comments: Stable, continue current medications, tolerating well  4. Chronic pain of left knee Comments: Tenderness to left lateral knee with a positive drawer test Unable to give Toradol injection due to ESRD, no steroids due to DM - Ambulatory referral to Orthopedic Surgery  5. Excessive cerumen in right ear canal Comments: Water lavage done with good success - Ear Lavage  6. ESRD (end stage renal disease) on dialysis ([place]) - AMB Referral to [place]  7. Class 3 obesity with alveolar hypoventilation and body mass index (BMI) of 40.0 to 44.9 in adult, unspecified whether serious comorbidity present [facility]) Comments: Continue healthy eating and physical activity as tolerated    Patient was given opportunity to ask questions. Patient verbalized understanding of the plan and was able to repeat key elements of the plan. All questions were answered to their satisfaction.   [name], FNP   I, [name], FNP, have reviewed all documentation for this visit. The documentation on 06/27/21 for the exam, diagnosis, procedures, and orders are all accurate and complete.   IF YOU HAVE BEEN REFERRED TO A SPECIALIST, IT MAY TAKE 1-2 WEEKS TO SCHEDULE/PROCESS THE REFERRAL. IF YOU HAVE NOT HEARD FROM US/SPECIALIST IN TWO WEEKS, PLEASE GIVE [place] A CALL AT [phone].   THE PATIENT IS ENCOURAGED TO PRACTICE SOCIAL DISTANCING DUE TO THE COVID-19 PANDEMIC.

## 2021-06-27 NOTE — Patient Instructions (Addendum)
Hypertension, Adult Hypertension is another name for high blood pressure. High blood pressure forces your heart to work harder to pump blood. This can cause problems overtime. There are two numbers in a blood pressure reading. There is a top number (systolic) over a bottom number (diastolic). It is best to have a blood pressure that is below 120/80. Healthy choicescan help lower your blood pressure, or you may need medicine to help lower it. What are the causes? The cause of this condition is not known. Some conditions may be related tohigh blood pressure. What increases the risk? Smoking. Having type 2 diabetes mellitus, high cholesterol, or both. Not getting enough exercise or physical activity. Being overweight. Having too much fat, sugar, calories, or salt (sodium) in your diet. Drinking too much alcohol. Having long-term (chronic) kidney disease. Having a family history of high blood pressure. Age. Risk increases with age. Race. You may be at higher risk if you are African American. Gender. Men are at higher risk than women before age 20. After age 72, women are at higher risk than men. Having obstructive sleep apnea. Stress. What are the signs or symptoms? High blood pressure may not cause symptoms. Very high blood pressure (hypertensive crisis) may cause: Headache. Feelings of worry or nervousness (anxiety). Shortness of breath. Nosebleed. A feeling of being sick to your stomach (nausea). Throwing up (vomiting). Changes in how you see. Very bad chest pain. Seizures. How is this treated? This condition is treated by making healthy lifestyle changes, such as: Eating healthy foods. Exercising more. Drinking less alcohol. Your health care provider may prescribe medicine if lifestyle changes are not enough to get your blood pressure under control, and if: Your top number is above 130. Your bottom number is above 80. Your personal target blood pressure may vary. Follow these  instructions at home: Eating and drinking  If told, follow the DASH eating plan. To follow this plan: Fill one half of your plate at each meal with fruits and vegetables. Fill one fourth of your plate at each meal with whole grains. Whole grains include whole-wheat pasta, brown rice, and whole-grain bread. Eat or drink low-fat dairy products, such as skim milk or low-fat yogurt. Fill one fourth of your plate at each meal with low-fat (lean) proteins. Low-fat proteins include fish, chicken without skin, eggs, beans, and tofu. Avoid fatty meat, cured and processed meat, or chicken with skin. Avoid pre-made or processed food. Eat less than 1,500 mg of salt each day. Do not drink alcohol if: Your doctor tells you not to drink. You are pregnant, may be pregnant, or are planning to become pregnant. If you drink alcohol: Limit how much you use to: 0-1 drink a day for women. 0-2 drinks a day for men. Be aware of how much alcohol is in your drink. In the [place] one drink equals one 12 oz bottle of beer (355 mL), one 5 oz glass of wine (148 mL), or one 1 oz glass of hard liquor (44 mL).  Lifestyle  Work with your doctor to stay at a healthy weight or to lose weight. Ask your doctor what the best weight is for you. Get at least 30 minutes of exercise most days of the week. This may include walking, swimming, or biking. Get at least 30 minutes of exercise that strengthens your muscles (resistance exercise) at least 3 days a week. This may include lifting weights or doing Pilates. Do not use any products that contain nicotine or tobacco, such as cigarettes,  e-cigarettes, and chewing tobacco. If you need help quitting, ask your doctor. Check your blood pressure at home as told by your doctor. Keep all follow-up visits as told by your doctor. This is important.  Medicines Take over-the-counter and prescription medicines only as told by your doctor. Follow directions carefully. Do not skip doses of  blood pressure medicine. The medicine does not work as well if you skip doses. Skipping doses also puts you at risk for problems. Ask your doctor about side effects or reactions to medicines that you should watch for. Contact a doctor if you: Think you are having a reaction to the medicine you are taking. Have headaches that keep coming back (recurring). Feel dizzy. Have swelling in your ankles. Have trouble with your vision. Get help right away if you: Get a very bad headache. Start to feel mixed up (confused). Feel weak or numb. Feel faint. Have very bad pain in your: Chest. Belly (abdomen). Throw up more than once. Have trouble breathing. Summary Hypertension is another name for high blood pressure. High blood pressure forces your heart to work harder to pump blood. For most people, a normal blood pressure is less than 120/80. Making healthy choices can help lower blood pressure. If your blood pressure does not get lower with healthy choices, you may need to take medicine. This information is not intended to replace advice given to you by your health care provider. Make sure you discuss any questions you have with your healthcare provider. Document Revised: 07/29/2018 Document Reviewed: 07/29/2018 Elsevier Patient Education  2022 [place].   Take tylenol and use the diclofenac gel together for the knee pain until you are able to see orthopedics

## 2021-06-27 NOTE — Patient Instructions (Signed)
Jacqueline [name] , Thank you for taking time to come for your Medicare Wellness Visit. I appreciate your ongoing commitment to your health goals. Please review the following plan we discussed and let me know if I can assist you in the future.   Screening recommendations/referrals: Colonoscopy: not required Mammogram: completed 11/22/2019 Bone Density: completed 07/30/2017 Recommended yearly ophthalmology/optometry visit for glaucoma screening and checkup Recommended yearly dental visit for hygiene and checkup  Vaccinations: Influenza vaccine: completed 08/29/2020, due 07/02/2021 Pneumococcal vaccine: completed 09/13/2019 Tdap vaccine: completed 06/20/2013, due 06/21/2023 Shingles vaccine: discussed   Covid-19: 10/19/2020, 02/10/2020, 01/13/2020  Advanced directives: Advance directive discussed with you today. Even though you declined this today please call our office should you change your mind and we can give you the proper paperwork for you to fill out.  Conditions/risks identified: none  Next appointment: Follow up in one year for your annual wellness visit    Preventive Care 65 Years and Older, Female Preventive care refers to lifestyle choices and visits with your health care provider that can promote health and wellness. What does preventive care include? A yearly physical exam. This is also called an annual well check. Dental exams once or twice a year. Routine eye exams. Ask your health care provider how often you should have your eyes checked. Personal lifestyle choices, including: Daily care of your teeth and gums. Regular physical activity. Eating a healthy diet. Avoiding tobacco and drug use. Limiting alcohol use. Practicing safe sex. Taking low-dose aspirin every day. Taking vitamin and mineral supplements as recommended by your health care provider. What happens during an annual well check? The services and screenings done by your health care provider during your annual  well check will depend on your age, overall health, lifestyle risk factors, and family history of disease. Counseling  Your health care provider may ask you questions about your: Alcohol use. Tobacco use. Drug use. Emotional well-being. Home and relationship well-being. Sexual activity. Eating habits. History of falls. Memory and ability to understand (cognition). Work and work Statistician. Reproductive health. Screening  You may have the following tests or measurements: Height, weight, and BMI. Blood pressure. Lipid and cholesterol levels. These may be checked every 5 years, or more frequently if you are over 7 years old. Skin check. Lung cancer screening. You may have this screening every year starting at age 85 if you have a 30-pack-year history of smoking and currently smoke or have quit within the past 15 years. Fecal occult blood test (FOBT) of the stool. You may have this test every year starting at age 45. Flexible sigmoidoscopy or colonoscopy. You may have a sigmoidoscopy every 5 years or a colonoscopy every 10 years starting at age 6. Hepatitis C blood test. Hepatitis B blood test. Sexually transmitted disease (STD) testing. Diabetes screening. This is done by checking your blood sugar (glucose) after you have not eaten for a while (fasting). You may have this done every 1-3 years. Bone density scan. This is done to screen for osteoporosis. You may have this done starting at age 8. Mammogram. This may be done every 1-2 years. Talk to your health care provider about how often you should have regular mammograms. Talk with your health care provider about your test results, treatment options, and if necessary, the need for more tests. Vaccines  Your health care provider may recommend certain vaccines, such as: Influenza vaccine. This is recommended every year. Tetanus, diphtheria, and acellular pertussis (Tdap, Td) vaccine. You may need a Td booster every  10 years. Zoster  vaccine. You may need this after age 10. Pneumococcal 13-valent conjugate (PCV13) vaccine. One dose is recommended after age 71. Pneumococcal polysaccharide (PPSV23) vaccine. One dose is recommended after age 91. Talk to your health care provider about which screenings and vaccines you need and how often you need them. This information is not intended to replace advice given to you by your health care provider. Make sure you discuss any questions you have with your health care provider. Document Released: 12/15/2015 Document Revised: 08/07/2016 Document Reviewed: 09/19/2015 Elsevier Interactive Patient Education  2017 [place] Prevention in the Home Falls can cause injuries. They can happen to people of all ages. There are many things you can do to make your home safe and to help prevent falls. What can I do on the outside of my home? Regularly fix the edges of walkways and driveways and fix any cracks. Remove anything that might make you trip as you walk through a door, such as a raised step or threshold. Trim any bushes or trees on the path to your home. Use bright outdoor lighting. Clear any walking paths of anything that might make someone trip, such as rocks or tools. Regularly check to see if handrails are loose or broken. Make sure that both sides of any steps have handrails. Any raised decks and porches should have guardrails on the edges. Have any leaves, snow, or ice cleared regularly. Use sand or salt on walking paths during winter. Clean up any spills in your garage right away. This includes oil or grease spills. What can I do in the bathroom? Use night lights. Install grab bars by the toilet and in the tub and shower. Do not use towel bars as grab bars. Use non-skid mats or decals in the tub or shower. If you need to sit down in the shower, use a plastic, non-slip stool. Keep the floor dry. Clean up any water that spills on the floor as soon as it happens. Remove  soap buildup in the tub or shower regularly. Attach bath mats securely with double-sided non-slip rug tape. Do not have throw rugs and other things on the floor that can make you trip. What can I do in the bedroom? Use night lights. Make sure that you have a light by your bed that is easy to reach. Do not use any sheets or blankets that are too big for your bed. They should not hang down onto the floor. Have a firm chair that has side arms. You can use this for support while you get dressed. Do not have throw rugs and other things on the floor that can make you trip. What can I do in the kitchen? Clean up any spills right away. Avoid walking on wet floors. Keep items that you use a lot in easy-to-reach places. If you need to reach something above you, use a strong step stool that has a grab bar. Keep electrical cords out of the way. Do not use floor polish or wax that makes floors slippery. If you must use wax, use non-skid floor wax. Do not have throw rugs and other things on the floor that can make you trip. What can I do with my stairs? Do not leave any items on the stairs. Make sure that there are handrails on both sides of the stairs and use them. Fix handrails that are broken or loose. Make sure that handrails are as long as the stairways. Check any carpeting to make  sure that it is firmly attached to the stairs. Fix any carpet that is loose or worn. Avoid having throw rugs at the top or bottom of the stairs. If you do have throw rugs, attach them to the floor with carpet tape. Make sure that you have a light switch at the top of the stairs and the bottom of the stairs. If you do not have them, ask someone to add them for you. What else can I do to help prevent falls? Wear shoes that: Do not have high heels. Have rubber bottoms. Are comfortable and fit you well. Are closed at the toe. Do not wear sandals. If you use a stepladder: Make sure that it is fully opened. Do not climb a  closed stepladder. Make sure that both sides of the stepladder are locked into place. Ask someone to hold it for you, if possible. Clearly mark and make sure that you can see: Any grab bars or handrails. First and last steps. Where the edge of each step is. Use tools that help you move around (mobility aids) if they are needed. These include: Canes. Walkers. Scooters. Crutches. Turn on the lights when you go into a dark area. Replace any light bulbs as soon as they burn out. Set up your furniture so you have a clear path. Avoid moving your furniture around. If any of your floors are uneven, fix them. If there are any pets around you, be aware of where they are. Review your medicines with your doctor. Some medicines can make you feel dizzy. This can increase your chance of falling. Ask your doctor what other things that you can do to help prevent falls. This information is not intended to replace advice given to you by your health care provider. Make sure you discuss any questions you have with your health care provider. Document Released: 09/14/2009 Document Revised: 04/25/2016 Document Reviewed: 12/23/2014 Elsevier Interactive Patient Education  2017 [employer].

## 2021-06-27 NOTE — Progress Notes (Signed)
This visit occurred during the SARS-CoV-2 public health emergency.  Safety protocols were in place, including screening questions prior to the visit, additional usage of staff PPE, and extensive cleaning of exam room while observing appropriate contact time as indicated for disinfecting solutions.  Subjective:   Jacqueline [name] is a 75 y.o. female who presents for Medicare Annual (Subsequent) preventive examination.  Review of Systems     Cardiac Risk Factors include: advanced age (>69mn, >>51women);diabetes mellitus;dyslipidemia;hypertension;obesity (BMI >30kg/m2);sedentary lifestyle     Objective:    Today's Vitals   06/27/21 0930 06/27/21 0937  BP: (!) 100/50   Pulse: 81   Temp: 98.5 F (36.9 C)   TempSrc: Oral   SpO2: 90%   Weight: 248 lb (112.5 kg)   Height: 5' 4.4" (1.636 m)   PainSc:  10-Worst pain ever   Body mass index is 42.04 kg/m.  Advanced Directives 06/27/2021 06/07/2020 10/05/2019 06/22/2019 06/22/2019 05/12/2019 07/23/2017  Does Patient Have a Medical Advance Directive? No No No - No No No  Would patient like information on creating a medical advance directive? No - Patient declined No - Patient declined No - Patient declined No - Patient declined No - Patient declined - No - Patient declined  Pre-existing out of facility DNR order (yellow form or pink MOST form) - - - - - - -    Current Medications (verified) Outpatient Encounter Medications as of 06/27/2021  Medication Sig   aspirin EC 81 MG EC tablet Take 1 tablet (81 mg total) by mouth daily.   AURYXIA 1 GM 210 MG(Fe) tablet Take 420 mg by mouth 2 (two) times a day.   B Complex-C-Folic Acid (DIALYVITE 8Q000111Q 0.8 MG TABS Take 1 tablet by mouth daily.   benzoyl peroxide (BENZOYL PEROXIDE) 5 % external liquid Apply topically 2 (two) times daily.   diclofenac sodium (VOLTAREN) 1 % GEL Apply 2 g topically 4 (four) times daily.   ezetimibe (ZETIA) 10 MG tablet TAKE 1 TABLET BY MOUTH  DAILY  OVERDUE FOLLOW UP   APPOINTMENT NEEDED FOR  FURTHER REFILLS.   metoCLOPramide (REGLAN) 10 MG tablet TAKE 1 TABLET BY MOUTH  TWICE DAILY 30 MINUTES  BEFORE MEALS   midodrine (PROAMATINE) 10 MG tablet Take 10 mg by mouth 3 (three) times a week. [facility] days [facility]   Omega-3 Fatty Acids (FISH OIL) 1000 MG CPDR Take 1,000 mg by mouth daily.    omeprazole (PRILOSEC) 20 MG capsule TAKE 1 CAPSULE BY MOUTH EVERY DAY BEFORE A MEAL   rosuvastatin (CRESTOR) 40 MG tablet Take 1 tablet (40 mg total) by mouth daily. Please schedule appointment for future refills. 3rd & FINAL attempt!   Vitamin D, Ergocalciferol, 50 MCG (2000 UT) CAPS Take 2,000 Units by mouth daily.   No facility-administered encounter medications on file as of 06/27/2021.    Allergies (verified) Other, Sulfa antibiotics, and Sulfa drugs cross reactors   History: Past Medical History:  Diagnosis Date   Arthritis    "left knee" (12/04/2016)   CAD (coronary artery disease)    a. 12/2016 NSTEMI/PCI: LM nl, LaD 20p/d, D1 20, OM2 20, OM3 20, RCA [id number] Resolute DES). EF 65%.   Chronic lower back pain    Diverticulosis    on CT   [facility] (12/04[facility]   GERD (gastroesophageal reflux disease)    Hiatal hernia    Hyperlipidemia    Hypertension    a. 12/2016  labetalol d/c'd 2/2 hypotension in HD.   Morbid obesity ([place])    Obesity    Pneumonia    "couple times" (12/04/2016)   Renal insufficiency    Type II diabetes mellitus ([place])    Past Surgical History:  Procedure Laterality Date   ABDOMINAL HYSTERECTOMY     AV FISTULA PLACEMENT Left 01/27/2014   Procedure: ARTERIOVENOUS (AV) FISTULA CREATION;  Surgeon: Mal Misty, MD;  Location: [place];  Service: Vascular;  Laterality: Left;   AV FISTULA REPAIR     "had it cleaned out"   CARDIAC CATHETERIZATION  ?[place] N/A 12/04/2016   Procedure: Left Heart Cath Angiography;  Surgeon: Burnell Blanks, MD;   Location: [place] [facility];  Service: Cardiovascular;  Laterality: N/A;   CARDIAC CATHETERIZATION N/A 12/04/2016   Procedure: Coronary Stent Intervention;  Surgeon: Burnell Blanks, MD;  Location: [place] [facility];  Service: Cardiovascular;  Laterality: N/A;  Mid RCA   CATARACT EXTRACTION W/ INTRAOCULAR LENS IMPLANT Left    COLONOSCOPY W/ BIOPSIES AND POLYPECTOMY     CORONARY ANGIOPLASTY WITH STENT PLACEMENT  12/04/2016   ESOPHAGOGASTRODUODENOSCOPY     INSERTION OF DIALYSIS CATHETER N/A 01/19/2014   Procedure: INSERTION OF DIALYSIS CATHETER;  Surgeon: Rosetta Posner, MD;  Location: [facility];  Service: Vascular;  Laterality: N/A;   Family History  Problem Relation Age of Onset   Diabetes Mother    Peripheral vascular disease Mother        amputation   CAD Father    Heart disease Father    Hypertension Father    Heart attack Father    CAD Sister    Cancer Brother        x 2 brothers, type unknown   Breast cancer Sister    Social History   Socioeconomic History   Marital status: Married    Spouse name: Not on file   Number of children: 4   Years of education: Not on file   Highest education level: Not on file  Occupational History   Occupation: retired  Tobacco Use   Smoking status: Former    Packs/day: 1.00    Years: 20.00    Pack years: 20.00    Types: Cigarettes    Quit date: 1985    Years since quitting: 37.5   Smokeless tobacco: Never  Vaping Use   Vaping Use: Never used  Substance and Sexual Activity   Alcohol use: No    Alcohol/week: 0.0 standard drinks   Drug use: No   Sexual activity: Not Currently  Other Topics Concern   Not on file  Social History Narrative   Not on file   Social Determinants of Health   Financial Resource Strain: Medium Risk   Difficulty of Paying Living Expenses: Somewhat hard  Food Insecurity: No Food Insecurity   Worried About Charity fundraiser in the Last Year: Never true   Ran Out of Food in the Last Year: Never true   Transportation Needs: No Transportation Needs   Lack of Transportation (Medical): No   Lack of Transportation (Non-Medical): No  Physical Activity: Inactive   Days of Exercise per Week: 0 days   Minutes of Exercise per Session: 0 min  Stress: No Stress Concern Present   Feeling of Stress : Not at all  Social Connections: Not on file    Tobacco Counseling Counseling given: Not Answered   Clinical Intake:  Pre-visit preparation completed: Yes  Pain :  0-10 Pain Score: 10-Worst pain ever Pain Location: Knee (right arm) Pain Orientation: Left Pain Descriptors / Indicators: Sharp, Aching Pain Onset: More than a month ago Pain Frequency: Constant     Nutritional Status: BMI > 30  Obese Nutritional Risks: None Diabetes: Yes  How often do you need to have someone help you when you read instructions, pamphlets, or other written materials from your doctor or pharmacy?: 1 - Never What is the last grade level you completed in school?: 12th grade  Diabetic? Yes Nutrition Risk Assessment:  Has the patient had any N/V/D within the last 2 months?  No  Does the patient have any non-healing wounds?  No  Has the patient had any unintentional weight loss or weight gain?  No   Diabetes:  Is the patient diabetic?  Yes  If diabetic, was a CBG obtained today?  No  Did the patient bring in their glucometer from home?  No  How often do you monitor your CBG's? Does not check.   Financial Strains and Diabetes Management:  Are you having any financial strains with the device, your supplies or your medication? No .  Does the patient want to be seen by Chronic Care Management for management of their diabetes?  No  Would the patient like to be referred to a Nutritionist or for Diabetic Management?  No   Diabetic Exams:  Diabetic Eye Exam: Overdue for diabetic eye exam. Pt has been advised about the importance in completing this exam. Patient advised to call and schedule an eye  exam. Diabetic Foot Exam: Overdue, Pt has been advised about the importance in completing this exam. Pt is scheduled for diabetic foot exam on next appointment.      Information entered by :: NAllen LPN   Activities of Daily Living In your present state of health, do you have any difficulty performing the following activities: 06/27/2021  Hearing? N  Vision? N  Difficulty concentrating or making decisions? N  Walking or climbing stairs? Y  Dressing or bathing? Y  Doing errands, shopping? Y  Preparing Food and eating ? Y  Using the Toilet? N  In the past six months, have you accidently leaked urine? N  Do you have problems with loss of bowel control? N  Managing your Medications? N  Managing your Finances? N  Housekeeping or managing your Housekeeping? Y  Some recent data might be hidden    Patient Care Team: [name], FNP as PCP - General ([place]) Sueanne Margarita, MD as PCP - Cardiology (Cardiology)  Indicate any recent Medical Services you may have received from other than [facility] providers in the past year (date may be approximate).     Assessment:   This is a routine wellness examination for [facility].  Hearing/Vision screen Vision Screening - Comments:: Regular eye exams, Dr. Katy Fitch  Dietary issues and exercise activities discussed: Current Exercise Habits: The patient does not participate in regular exercise at present   Goals Addressed             This Visit's Progress    Patient Stated       06/27/2021, wants to be able to walk without walker       Depression Screen PHQ 2/9 Scores 06/27/2021 06/07/2020 06/07/2020 09/13/2019 07/05/2019 05/12/2019 03/22/2019  PHQ - 2 Score 0 0 0 0 0 0 0  PHQ- 9 Score - 0 - - - 0 -    Fall Risk Fall Risk  06/27/2021 06/07/2020 06/07/2020 09/13/2019 07/05/2019  Falls in the past year? 0 0 0 0 0  Risk for fall due to : Medication side effect;Impaired mobility Medication side effect - - -  Follow up Falls evaluation  completed;Education provided;Falls prevention discussed Falls evaluation completed;Education provided;Falls prevention discussed - - -    FALL RISK PREVENTION PERTAINING TO THE HOME:  Any stairs in or around the home? No  If so, are there any without handrails?  N/a Home free of loose throw rugs in walkways, pet beds, electrical cords, etc? Yes  Adequate lighting in your home to reduce risk of falls? Yes   ASSISTIVE DEVICES UTILIZED TO PREVENT FALLS:  Life alert? No  Use of a cane, walker or w/c? Yes  Grab bars in the bathroom? No  Shower chair or bench in shower? Yes  Elevated toilet seat or a handicapped toilet? Yes   TIMED UP AND GO:  Was the test performed? No .  .   Gait slow and steady with assistive device  Cognitive Function:     6CIT Screen 06/27/2021 06/07/2020 05/12/2019  What Year? 0 points 0 points 0 points  What month? 0 points 0 points 0 points  What time? 0 points 3 points 0 points  Count back from 20 0 points 0 points 0 points  Months in reverse 0 points 0 points 2 points  Repeat phrase 4 points 2 points 0 points  Total Score '4 5 2    '$ Immunizations Immunization History  Administered Date(s) Administered   Influenza-Unspecified 09/30/2018, 08/30/2019   Moderna Sars-Covid-2 Vaccination 01/13/2020, 02/10/2020   Pneumococcal Polysaccharide-23 09/13/2019    TDAP status: Up to date  Flu Vaccine status: Up to date  Pneumococcal vaccine status: Up to date  Covid-19 vaccine status: Completed vaccines  Qualifies for Shingles Vaccine? Yes   Zostavax completed No   Shingrix Completed?: No.    Education has been provided regarding the importance of this vaccine. Patient has been advised to call insurance company to determine out of pocket expense if they have not yet received this vaccine. Advised may also receive vaccine at local pharmacy or Health Dept. Verbalized acceptance and understanding.  Screening Tests Health Maintenance  Topic Date Due   Zoster  Vaccines- Shingrix (1 of 2) Never done   OPHTHALMOLOGY EXAM  02/10/2020   URINE MICROALBUMIN  09/12/2020   COVID-19 Vaccine (4 - Booster for Moderna series) 02/16/2021   FOOT EXAM  06/07/2021   INFLUENZA VACCINE  07/02/2021   HEMOGLOBIN A1C  08/31/2021   TETANUS/TDAP  06/21/2023   DEXA SCAN  Completed   Hepatitis C Screening  Completed   PNA vac Low Risk Adult  Completed   HPV VACCINES  Aged Out    Health Maintenance  Health Maintenance Due  Topic Date Due   Zoster Vaccines- Shingrix (1 of 2) Never done   OPHTHALMOLOGY EXAM  02/10/2020   URINE MICROALBUMIN  09/12/2020   COVID-19 Vaccine (4 - Booster for Moderna series) 02/16/2021   FOOT EXAM  06/07/2021    Colorectal cancer screening: No longer required.   Mammogram status: Completed 11/22/2019. Repeat every year  Bone Density status: Completed 07/30/2017.   Lung Cancer Screening: (Low Dose CT Chest recommended if Age 63-80 years, 30 pack-year currently smoking OR have quit w/in 15years.) does not qualify.   Lung Cancer Screening Referral: no  Additional Screening:  Hepatitis C Screening: does qualify; Completed 03/21/2014  Vision Screening: Recommended annual ophthalmology exams for early detection of glaucoma and other disorders of the eye. Is  the patient up to date with their annual eye exam?  No  Who is the provider or what is the name of the office in which the patient attends annual eye exams? Dr. Katy Fitch If pt is not established with a provider, would they like to be referred to a provider to establish care? No .   Dental Screening: Recommended annual dental exams for proper oral hygiene  Community Resource Referral / Chronic Care Management: CRR required this visit?  No   CCM required this visit?  No      Plan:     I have personally reviewed and noted the following in the patient's chart:   Medical and social history Use of alcohol, tobacco or illicit drugs  Current medications and supplements including  opioid prescriptions.  Functional ability and status Nutritional status Physical activity Advanced directives List of other physicians Hospitalizations, surgeries, and ER visits in previous 12 months Vitals Screenings to include cognitive, depression, and falls Referrals and appointments  In addition, I have reviewed and discussed with patient certain preventive protocols, quality metrics, and best practice recommendations. A written personalized care plan for preventive services as well as general preventive health recommendations were provided to patient.     Kellie Simmering, LPN   [id number]   Nurse Notes:

## 2021-06-28 ENCOUNTER — Telehealth: Payer: Self-pay | Admitting: *Deleted

## 2021-06-28 DIAGNOSIS — E1129 Type 2 diabetes mellitus with other diabetic kidney complication: Secondary | ICD-10-CM | POA: Diagnosis not present

## 2021-06-28 DIAGNOSIS — R519 Headache, unspecified: Secondary | ICD-10-CM | POA: Diagnosis not present

## 2021-06-28 DIAGNOSIS — N186 End stage renal disease: Secondary | ICD-10-CM | POA: Diagnosis not present

## 2021-06-28 DIAGNOSIS — Z992 Dependence on renal dialysis: Secondary | ICD-10-CM | POA: Diagnosis not present

## 2021-06-28 DIAGNOSIS — N2581 Secondary hyperparathyroidism of renal origin: Secondary | ICD-10-CM | POA: Diagnosis not present

## 2021-06-28 DIAGNOSIS — D689 Coagulation defect, unspecified: Secondary | ICD-10-CM | POA: Diagnosis not present

## 2021-06-28 DIAGNOSIS — E876 Hypokalemia: Secondary | ICD-10-CM | POA: Diagnosis not present

## 2021-06-28 NOTE — Chronic Care Management (AMB) (Signed)
  Chronic Care Management   Outreach Note  06/28/2021 Name: Jacqueline [name] MRN: [id number] DOB: 20-Jul-1946  Jacqueline [name] is a 75 y.o. year old female who is a primary care patient of [name], [place]. I reached out to Jacqueline [name] by phone today in response to a referral sent by Ms. Clarisa Schools PCP, [name], FNP      An unsuccessful telephone outreach was attempted today. The patient was referred to the case management team for assistance with care management and care coordination.   Follow Up Plan: The care management team will reach out to the patient again over the next 7 days.  If patient returns call to provider office, please advise to call [place] at [phone].  [place] Management  Direct Dial: [phone]

## 2021-06-30 DIAGNOSIS — N2581 Secondary hyperparathyroidism of renal origin: Secondary | ICD-10-CM | POA: Diagnosis not present

## 2021-06-30 DIAGNOSIS — D689 Coagulation defect, unspecified: Secondary | ICD-10-CM | POA: Diagnosis not present

## 2021-06-30 DIAGNOSIS — R519 Headache, unspecified: Secondary | ICD-10-CM | POA: Diagnosis not present

## 2021-06-30 DIAGNOSIS — E876 Hypokalemia: Secondary | ICD-10-CM | POA: Diagnosis not present

## 2021-06-30 DIAGNOSIS — N186 End stage renal disease: Secondary | ICD-10-CM | POA: Diagnosis not present

## 2021-06-30 DIAGNOSIS — E1129 Type 2 diabetes mellitus with other diabetic kidney complication: Secondary | ICD-10-CM | POA: Diagnosis not present

## 2021-06-30 DIAGNOSIS — Z992 Dependence on renal dialysis: Secondary | ICD-10-CM | POA: Diagnosis not present

## 2021-07-01 DIAGNOSIS — N186 End stage renal disease: Secondary | ICD-10-CM | POA: Diagnosis not present

## 2021-07-01 DIAGNOSIS — Z992 Dependence on renal dialysis: Secondary | ICD-10-CM | POA: Diagnosis not present

## 2021-07-01 DIAGNOSIS — E1129 Type 2 diabetes mellitus with other diabetic kidney complication: Secondary | ICD-10-CM | POA: Diagnosis not present

## 2021-07-02 ENCOUNTER — Encounter: Payer: Self-pay | Admitting: Nurse Practitioner

## 2021-07-03 DIAGNOSIS — N186 End stage renal disease: Secondary | ICD-10-CM | POA: Diagnosis not present

## 2021-07-03 DIAGNOSIS — E1129 Type 2 diabetes mellitus with other diabetic kidney complication: Secondary | ICD-10-CM | POA: Diagnosis not present

## 2021-07-03 DIAGNOSIS — Z992 Dependence on renal dialysis: Secondary | ICD-10-CM | POA: Diagnosis not present

## 2021-07-03 DIAGNOSIS — E876 Hypokalemia: Secondary | ICD-10-CM | POA: Diagnosis not present

## 2021-07-03 DIAGNOSIS — D689 Coagulation defect, unspecified: Secondary | ICD-10-CM | POA: Diagnosis not present

## 2021-07-03 DIAGNOSIS — R519 Headache, unspecified: Secondary | ICD-10-CM | POA: Diagnosis not present

## 2021-07-03 DIAGNOSIS — N2581 Secondary hyperparathyroidism of renal origin: Secondary | ICD-10-CM | POA: Diagnosis not present

## 2021-07-05 DIAGNOSIS — N186 End stage renal disease: Secondary | ICD-10-CM | POA: Diagnosis not present

## 2021-07-05 DIAGNOSIS — D689 Coagulation defect, unspecified: Secondary | ICD-10-CM | POA: Diagnosis not present

## 2021-07-05 DIAGNOSIS — R519 Headache, unspecified: Secondary | ICD-10-CM | POA: Diagnosis not present

## 2021-07-05 DIAGNOSIS — E876 Hypokalemia: Secondary | ICD-10-CM | POA: Diagnosis not present

## 2021-07-05 DIAGNOSIS — N2581 Secondary hyperparathyroidism of renal origin: Secondary | ICD-10-CM | POA: Diagnosis not present

## 2021-07-05 DIAGNOSIS — E1129 Type 2 diabetes mellitus with other diabetic kidney complication: Secondary | ICD-10-CM | POA: Diagnosis not present

## 2021-07-05 DIAGNOSIS — Z992 Dependence on renal dialysis: Secondary | ICD-10-CM | POA: Diagnosis not present

## 2021-07-05 NOTE — Chronic Care Management (AMB) (Signed)
  Chronic Care Management   Outreach Note  07/05/2021 Name: [name] MRN: [id number] DOB: Oct 02, 1946  Jacqueline [name] is a 75 y.o. year old female who is a primary care patient of [name], [place]. I reached out to Jacqueline [name] by phone today in response to a referral sent by Ms. Clarisa Schools PCP, [name], FNP      A second unsuccessful telephone outreach was attempted today. The patient was referred to the case management team for assistance with care management and care coordination.   Follow Up Plan: A HIPAA compliant phone message was left for the patient providing contact information and requesting a return call. The care management team will reach out to the patient again over the next 7-14 days.  If patient returns call to provider office, please advise to call [place] at [phone].  [facility] Management  Direct Dial: [phone]

## 2021-07-07 DIAGNOSIS — R519 Headache, unspecified: Secondary | ICD-10-CM | POA: Diagnosis not present

## 2021-07-07 DIAGNOSIS — Z992 Dependence on renal dialysis: Secondary | ICD-10-CM | POA: Diagnosis not present

## 2021-07-07 DIAGNOSIS — D689 Coagulation defect, unspecified: Secondary | ICD-10-CM | POA: Diagnosis not present

## 2021-07-07 DIAGNOSIS — E876 Hypokalemia: Secondary | ICD-10-CM | POA: Diagnosis not present

## 2021-07-07 DIAGNOSIS — N186 End stage renal disease: Secondary | ICD-10-CM | POA: Diagnosis not present

## 2021-07-07 DIAGNOSIS — E1129 Type 2 diabetes mellitus with other diabetic kidney complication: Secondary | ICD-10-CM | POA: Diagnosis not present

## 2021-07-07 DIAGNOSIS — N2581 Secondary hyperparathyroidism of renal origin: Secondary | ICD-10-CM | POA: Diagnosis not present

## 2021-07-10 DIAGNOSIS — Z992 Dependence on renal dialysis: Secondary | ICD-10-CM | POA: Diagnosis not present

## 2021-07-10 DIAGNOSIS — E1129 Type 2 diabetes mellitus with other diabetic kidney complication: Secondary | ICD-10-CM | POA: Diagnosis not present

## 2021-07-10 DIAGNOSIS — R519 Headache, unspecified: Secondary | ICD-10-CM | POA: Diagnosis not present

## 2021-07-10 DIAGNOSIS — E876 Hypokalemia: Secondary | ICD-10-CM | POA: Diagnosis not present

## 2021-07-10 DIAGNOSIS — D689 Coagulation defect, unspecified: Secondary | ICD-10-CM | POA: Diagnosis not present

## 2021-07-10 DIAGNOSIS — N2581 Secondary hyperparathyroidism of renal origin: Secondary | ICD-10-CM | POA: Diagnosis not present

## 2021-07-10 DIAGNOSIS — N186 End stage renal disease: Secondary | ICD-10-CM | POA: Diagnosis not present

## 2021-07-12 DIAGNOSIS — D689 Coagulation defect, unspecified: Secondary | ICD-10-CM | POA: Diagnosis not present

## 2021-07-12 DIAGNOSIS — E1129 Type 2 diabetes mellitus with other diabetic kidney complication: Secondary | ICD-10-CM | POA: Diagnosis not present

## 2021-07-12 DIAGNOSIS — Z992 Dependence on renal dialysis: Secondary | ICD-10-CM | POA: Diagnosis not present

## 2021-07-12 DIAGNOSIS — N186 End stage renal disease: Secondary | ICD-10-CM | POA: Diagnosis not present

## 2021-07-12 DIAGNOSIS — E876 Hypokalemia: Secondary | ICD-10-CM | POA: Diagnosis not present

## 2021-07-12 DIAGNOSIS — N2581 Secondary hyperparathyroidism of renal origin: Secondary | ICD-10-CM | POA: Diagnosis not present

## 2021-07-12 DIAGNOSIS — R519 Headache, unspecified: Secondary | ICD-10-CM | POA: Diagnosis not present

## 2021-07-14 DIAGNOSIS — E876 Hypokalemia: Secondary | ICD-10-CM | POA: Diagnosis not present

## 2021-07-14 DIAGNOSIS — N2581 Secondary hyperparathyroidism of renal origin: Secondary | ICD-10-CM | POA: Diagnosis not present

## 2021-07-14 DIAGNOSIS — Z992 Dependence on renal dialysis: Secondary | ICD-10-CM | POA: Diagnosis not present

## 2021-07-14 DIAGNOSIS — D689 Coagulation defect, unspecified: Secondary | ICD-10-CM | POA: Diagnosis not present

## 2021-07-14 DIAGNOSIS — E1129 Type 2 diabetes mellitus with other diabetic kidney complication: Secondary | ICD-10-CM | POA: Diagnosis not present

## 2021-07-14 DIAGNOSIS — N186 End stage renal disease: Secondary | ICD-10-CM | POA: Diagnosis not present

## 2021-07-14 DIAGNOSIS — R519 Headache, unspecified: Secondary | ICD-10-CM | POA: Diagnosis not present

## 2021-07-17 DIAGNOSIS — N186 End stage renal disease: Secondary | ICD-10-CM | POA: Diagnosis not present

## 2021-07-17 DIAGNOSIS — Z992 Dependence on renal dialysis: Secondary | ICD-10-CM | POA: Diagnosis not present

## 2021-07-17 DIAGNOSIS — E1129 Type 2 diabetes mellitus with other diabetic kidney complication: Secondary | ICD-10-CM | POA: Diagnosis not present

## 2021-07-17 DIAGNOSIS — R519 Headache, unspecified: Secondary | ICD-10-CM | POA: Diagnosis not present

## 2021-07-17 DIAGNOSIS — N2581 Secondary hyperparathyroidism of renal origin: Secondary | ICD-10-CM | POA: Diagnosis not present

## 2021-07-17 DIAGNOSIS — D689 Coagulation defect, unspecified: Secondary | ICD-10-CM | POA: Diagnosis not present

## 2021-07-17 DIAGNOSIS — E876 Hypokalemia: Secondary | ICD-10-CM | POA: Diagnosis not present

## 2021-07-18 NOTE — Chronic Care Management (AMB) (Signed)
  Chronic Care Management   Note  07/18/2021 Name: Jacqueline [name] MRN: [id number] DOB: [birth year]  [name] is a 75 y.o. year old female who is a primary care patient of [name], [place]. I reached out to [name] by phone today in response to a referral sent by Ms. Clarisa Schools PCP, [name], FNP      Ms. Pals was given information about Chronic Care Management services today including:  CCM service includes personalized support from designated clinical staff supervised by her physician, including individualized plan of care and coordination with other care providers 24/7 contact phone numbers for assistance for urgent and routine care needs. Service will only be billed when office clinical staff spend 20 minutes or more in a month to coordinate care. Only one practitioner may furnish and bill the service in a calendar month. The patient may stop CCM services at any time (effective at the end of the month) by phone call to the office staff. The patient will be responsible for cost sharing (co-pay) of up to 20% of the service fee (after annual deductible is met).  Patient agreed to services and verbal consent obtained.   Follow up plan: Telephone appointment with care management team member scheduled for:07/27/21  Celestia Duva  Care Guide, Embedded Care Coordination [place]  Care Management  Direct Dial: [phone]

## 2021-07-19 DIAGNOSIS — D689 Coagulation defect, unspecified: Secondary | ICD-10-CM | POA: Diagnosis not present

## 2021-07-19 DIAGNOSIS — E1129 Type 2 diabetes mellitus with other diabetic kidney complication: Secondary | ICD-10-CM | POA: Diagnosis not present

## 2021-07-19 DIAGNOSIS — N2581 Secondary hyperparathyroidism of renal origin: Secondary | ICD-10-CM | POA: Diagnosis not present

## 2021-07-19 DIAGNOSIS — R519 Headache, unspecified: Secondary | ICD-10-CM | POA: Diagnosis not present

## 2021-07-19 DIAGNOSIS — N186 End stage renal disease: Secondary | ICD-10-CM | POA: Diagnosis not present

## 2021-07-19 DIAGNOSIS — Z992 Dependence on renal dialysis: Secondary | ICD-10-CM | POA: Diagnosis not present

## 2021-07-19 DIAGNOSIS — E876 Hypokalemia: Secondary | ICD-10-CM | POA: Diagnosis not present

## 2021-07-21 DIAGNOSIS — E876 Hypokalemia: Secondary | ICD-10-CM | POA: Diagnosis not present

## 2021-07-21 DIAGNOSIS — N186 End stage renal disease: Secondary | ICD-10-CM | POA: Diagnosis not present

## 2021-07-21 DIAGNOSIS — N2581 Secondary hyperparathyroidism of renal origin: Secondary | ICD-10-CM | POA: Diagnosis not present

## 2021-07-21 DIAGNOSIS — D689 Coagulation defect, unspecified: Secondary | ICD-10-CM | POA: Diagnosis not present

## 2021-07-21 DIAGNOSIS — Z992 Dependence on renal dialysis: Secondary | ICD-10-CM | POA: Diagnosis not present

## 2021-07-21 DIAGNOSIS — R519 Headache, unspecified: Secondary | ICD-10-CM | POA: Diagnosis not present

## 2021-07-21 DIAGNOSIS — E1129 Type 2 diabetes mellitus with other diabetic kidney complication: Secondary | ICD-10-CM | POA: Diagnosis not present

## 2021-07-24 DIAGNOSIS — N2581 Secondary hyperparathyroidism of renal origin: Secondary | ICD-10-CM | POA: Diagnosis not present

## 2021-07-24 DIAGNOSIS — Z992 Dependence on renal dialysis: Secondary | ICD-10-CM | POA: Diagnosis not present

## 2021-07-24 DIAGNOSIS — E1129 Type 2 diabetes mellitus with other diabetic kidney complication: Secondary | ICD-10-CM | POA: Diagnosis not present

## 2021-07-24 DIAGNOSIS — E876 Hypokalemia: Secondary | ICD-10-CM | POA: Diagnosis not present

## 2021-07-24 DIAGNOSIS — D689 Coagulation defect, unspecified: Secondary | ICD-10-CM | POA: Diagnosis not present

## 2021-07-24 DIAGNOSIS — R519 Headache, unspecified: Secondary | ICD-10-CM | POA: Diagnosis not present

## 2021-07-24 DIAGNOSIS — N186 End stage renal disease: Secondary | ICD-10-CM | POA: Diagnosis not present

## 2021-07-26 DIAGNOSIS — E1129 Type 2 diabetes mellitus with other diabetic kidney complication: Secondary | ICD-10-CM | POA: Diagnosis not present

## 2021-07-26 DIAGNOSIS — D689 Coagulation defect, unspecified: Secondary | ICD-10-CM | POA: Diagnosis not present

## 2021-07-26 DIAGNOSIS — N186 End stage renal disease: Secondary | ICD-10-CM | POA: Diagnosis not present

## 2021-07-26 DIAGNOSIS — E876 Hypokalemia: Secondary | ICD-10-CM | POA: Diagnosis not present

## 2021-07-26 DIAGNOSIS — R519 Headache, unspecified: Secondary | ICD-10-CM | POA: Diagnosis not present

## 2021-07-26 DIAGNOSIS — Z992 Dependence on renal dialysis: Secondary | ICD-10-CM | POA: Diagnosis not present

## 2021-07-26 DIAGNOSIS — N2581 Secondary hyperparathyroidism of renal origin: Secondary | ICD-10-CM | POA: Diagnosis not present

## 2021-07-27 ENCOUNTER — Telehealth: Payer: Medicare Other

## 2021-07-27 ENCOUNTER — Ambulatory Visit: Payer: Self-pay

## 2021-07-27 ENCOUNTER — Ambulatory Visit (INDEPENDENT_AMBULATORY_CARE_PROVIDER_SITE_OTHER): Payer: Medicare Other

## 2021-07-27 DIAGNOSIS — R809 Proteinuria, unspecified: Secondary | ICD-10-CM | POA: Diagnosis not present

## 2021-07-27 DIAGNOSIS — N186 End stage renal disease: Secondary | ICD-10-CM

## 2021-07-27 DIAGNOSIS — E1129 Type 2 diabetes mellitus with other diabetic kidney complication: Secondary | ICD-10-CM

## 2021-07-27 DIAGNOSIS — Z992 Dependence on renal dialysis: Secondary | ICD-10-CM | POA: Diagnosis not present

## 2021-07-27 DIAGNOSIS — I132 Hypertensive heart and chronic kidney disease with heart failure and with stage 5 chronic kidney disease, or end stage renal disease: Secondary | ICD-10-CM

## 2021-07-27 NOTE — Chronic Care Management (AMB) (Signed)
Social Work Note  07/27/2021 Name: Jacqueline [name] MRN: [id number] DOB: 01/27/1946  Jacqueline [name] is a 75 y.o. year old female who is a primary care patient of [name], [place].  The Care Management team was consulted for assistance with chronic disease management and care coordination needs.  [name] was given information about Care Management services today including:  Care Management services include personalized support from designated clinical staff supervised by her physician, including individualized plan of care and coordination with other care providers 24/7 contact phone numbers for assistance for urgent and routine care needs. The patient may stop care management services at any time (effective at the end of the month) by phone call to the office staff.  Patient agreed to services and consent obtained.   Engaged with patient by telephone for initial visit in response to provider referral for social work chronic care management and care coordination services.  Assessment: Review of patient past medical history, allergies, medications, and health status, including review of pertinent consultant reports was performed as part of comprehensive evaluation and provision of care management/care coordination services.   SDOH (Social Determinants of Health) assessments and interventions performed:  Yes SDOH Interventions    Flowsheet Row Most Recent Value  SDOH Interventions   Food Insecurity Interventions Intervention Not Indicated  Housing Interventions Other (Comment)  [Education on financial assistance program]  Transportation Interventions Intervention Not Indicated        Advanced Directives Status: Not ready or willing to discuss.  Care Plan  Allergies  Allergen Reactions   Other Other (See Comments)   Sulfa Antibiotics Other (See Comments)   Sulfa Drugs Cross Reactors Other (See Comments)    Doesn't remember     Outpatient Encounter Medications as of  07/27/2021  Medication Sig   aspirin EC 81 MG EC tablet Take 1 tablet (81 mg total) by mouth daily.   AURYXIA 1 GM 210 MG(Fe) tablet Take 420 mg by mouth 2 (two) times a day.   B Complex-C-Folic Acid (DIALYVITE Q000111Q) 0.8 MG TABS Take 1 tablet by mouth daily.   benzoyl peroxide (BENZOYL PEROXIDE) 5 % external liquid Apply topically 2 (two) times daily.   diclofenac sodium (VOLTAREN) 1 % GEL Apply 2 g topically 4 (four) times daily.   ezetimibe (ZETIA) 10 MG tablet TAKE 1 TABLET BY MOUTH  DAILY  OVERDUE FOLLOW UP  APPOINTMENT NEEDED FOR  FURTHER REFILLS.   metoCLOPramide (REGLAN) 10 MG tablet TAKE 1 TABLET BY MOUTH  TWICE DAILY 30 MINUTES  BEFORE MEALS   midodrine (PROAMATINE) 10 MG tablet Take 10 mg by mouth 3 (three) times a week. Dialysis days [facility] Saturday   Omega-3 Fatty Acids (FISH OIL) 1000 MG CPDR Take 1,000 mg by mouth daily.    omeprazole (PRILOSEC) 20 MG capsule TAKE 1 CAPSULE BY MOUTH EVERY DAY BEFORE A MEAL   rosuvastatin (CRESTOR) 40 MG tablet Take 1 tablet (40 mg total) by mouth daily. Please schedule appointment for future refills. 3rd & FINAL attempt!   Vitamin D, Ergocalciferol, 50 MCG (2000 UT) CAPS Take 2,000 Units by mouth daily.   No facility-administered encounter medications on file as of 07/27/2021.    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   OSA (obstructive sleep apnea) 01/21/2014   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[place]) 01/21/2014   Acute encephalopathy 01/19/2014   Congestive  heart failure ([place]) 01/15/2014   ESRD (end stage renal disease) on dialysis ([place]) 01/15/2014   Hypertension 01/15/2014   Diabetes mellitus ([place]) 01/15/2014   Anemia 01/15/2014   Onychomycosis 03/26/2013   Pain in joint, ankle and foot 03/26/2013    Conditions to be addressed/monitored: DMII, ESRD, and 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 ; Financial constraints related to utility costs and Inability to safely drink water or use for preparing meals  Care Plan : General Social Work (Adult)  Updates made by Daneen Schick since 07/27/2021 12:00 AM     Problem: Quality of Life Improved      Long-Range Goal: Identify Resources to Improve Quality of Life   Start Date: 07/27/2021  Expected End Date: 11/24/2021  Priority: Medium  Note:   Current Barriers:  Chronic disease management support and education needs related to DM, ESRD, and 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   Inability to safely drink water or use for cooking Financial challenges related to utility costs  Social Worker Clinical Goal(s):  patient will work with SW to identify and address any acute and/or chronic care coordination needs related to the self health management of DM, ESRD, and 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   explore community resource options for unmet needs related [id number] constraints and Water Concerns   SW Interventions:  Inter-disciplinary care team collaboration (see longitudinal plan of care) Collaboration with [name], [place] regarding development and update of comprehensive plan of care as evidenced by provider attestation and co-signature Successful outbound call placed to the patient to conduct a SW screen Determined the patient is having difficulty affording utilities as well as [employer] bill Patient reports she is behind on both accounts but is making monthly payments Provided written and verbal education to the patient on the [facility] low income energy  assistance program (LIEAP) Advised the patient when the application window opens this winter she may apply to receive some assistance with utility costs Discussed the patient is unable to use her water in her home for drinking or cooking Patient reports she has a well that needs some repair- she was told something is in her water that is unsafe to ingest. Patient stated she is unable to pay to fix this concern Patient states she and her husband buy jugs of water to drink and eat out at every meal including [employer], [employer], [employer], [employer], and other fast food locations Advised the patient SW would research resources that may assist with repair needs Scheduled follow up call with the patient over the next 14 days Outbound call placed to [facility] - informed they do have a program but cannot assist due to patient living in [place] Attempted to contact [place] home repair program - program is not taking applications at this time Collaboration with Sharol Given of [employer] to request feedback on the ability for the agency to assist Collaboration with Consulting civil engineer to advise of interventions and plan  Patient Goals/Self-Care Activities patient will:   -  work with SW to identify resources to improve quality of life  Follow Up Plan:  SW will continue to follow       Follow Up Plan: SW will follow up with patient by phone over the next 14 days. SW will continue to seek resources to assist with repairing well.      Daneen Schick, BSW, CDP Social Worker, Certified Dementia Practitioner [place] [facility] [place] [facility] [phone]

## 2021-07-27 NOTE — Patient Instructions (Addendum)
Visit Information  PATIENT GOALS:   Goals Addressed             This Visit's Progress    Identify Resources to Improve Quality of Life       Timeframe:  Long-Range Goal Priority:  Medium Start Date:   8.26.22                          Expected End Date:  12.24.22                      Patient Goals/Self-Care Activities patient will:   - work with SW to identify resources to improve quality of life        Consent to Services:  The patient was given the following information about Chronic Care Management services today, agreed to services, and gave verbal consent: 1. CCM service includes personalized support from designated clinical staff supervised by the primary care provider, including individualized plan of care and coordination with other care providers 2. 24/7 contact phone numbers for assistance for urgent and routine care needs. 3. Service will only be billed when office clinical staff spend 20 minutes or more in a month to coordinate care. 4. Only one practitioner may furnish and bill the service in a calendar month. 5.The patient may stop CCM services at any time (effective at the end of the month) by phone call to the office staff. 6. The patient will be responsible for cost sharing (co-pay) of up to 20% of the service fee (after annual deductible is met). Patient agreed to services and consent obtained.   Patient agreed to services and verbal consent obtained.   The patient verbalized understanding of instructions, educational materials, and care plan provided today and declined offer to receive copy of patient instructions, educational materials, and care plan.   The care management team will reach out to the patient again over the next 14 days.   Daneen Schick, BSW, CDP Social Worker, Certified Dementia Practitioner [place] / [place] [facility] [phone]

## 2021-07-27 NOTE — Chronic Care Management (AMB) (Addendum)
Care Management   CCM RN Visit Note  07/27/2021 Name: Jacqueline [name] MRN: [id number] DOB: October 27, 1946  Subjective: Jacqueline [name] is a 75 y.o. year old female who is a primary care patient of [name], [place]. The care management team was consulted for assistance with disease management and care coordination needs.    Collaboration with Daneen Schick BSW  for  Case Collaboration   in response to provider referral for case management and/or care coordination services.   Consent to Services:  The patient was given information about Chronic Care Management services, agreed to services, and gave verbal consent prior to initiation of services.  Please see initial visit note for detailed documentation.   Patient agreed to services and verbal consent obtained.   Assessment: Review of patient past medical history, allergies, medications, health status, including review of consultants reports, laboratory and other test data, was performed as part of comprehensive evaluation and provision of chronic care management services.   SDOH (Social Determinants of Health) assessments and interventions performed:    CCM Care Plan  Allergies  Allergen Reactions   Other Other (See Comments)   Sulfa Antibiotics Other (See Comments)   Sulfa Drugs Cross Reactors Other (See Comments)    Doesn't remember     Outpatient Encounter Medications as of 07/27/2021  Medication Sig   aspirin EC 81 MG EC tablet Take 1 tablet (81 mg total) by mouth daily.   AURYXIA 1 GM 210 MG(Fe) tablet Take 420 mg by mouth 2 (two) times a day.   B Complex-C-Folic Acid (DIALYVITE Q000111Q) 0.8 MG TABS Take 1 tablet by mouth daily.   benzoyl peroxide (BENZOYL PEROXIDE) 5 % external liquid Apply topically 2 (two) times daily.   diclofenac sodium (VOLTAREN) 1 % GEL Apply 2 g topically 4 (four) times daily.   ezetimibe (ZETIA) 10 MG tablet TAKE 1 TABLET BY MOUTH  DAILY  OVERDUE FOLLOW UP  APPOINTMENT NEEDED FOR  FURTHER REFILLS.    metoCLOPramide (REGLAN) 10 MG tablet TAKE 1 TABLET BY MOUTH  TWICE DAILY 30 MINUTES  BEFORE MEALS   midodrine (PROAMATINE) 10 MG tablet Take 10 mg by mouth 3 (three) times a week. Dialysis days [facility] Saturday   Omega-3 Fatty Acids (FISH OIL) 1000 MG CPDR Take 1,000 mg by mouth daily.    omeprazole (PRILOSEC) 20 MG capsule TAKE 1 CAPSULE BY MOUTH EVERY DAY BEFORE A MEAL   rosuvastatin (CRESTOR) 40 MG tablet Take 1 tablet (40 mg total) by mouth daily. Please schedule appointment for future refills. 3rd & FINAL attempt!   Vitamin D, Ergocalciferol, 50 MCG (2000 UT) CAPS Take 2,000 Units by mouth daily.   No facility-administered encounter medications on file as of 07/27/2021.    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   OSA (obstructive sleep apnea) 01/21/2014   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[place]) 01/21/2014   Acute encephalopathy 01/19/2014   Congestive heart failure ([place]) 01/15/2014   ESRD (end stage renal disease) on dialysis ([place]) 01/15/2014   Hypertension 01/15/2014  Diabetes mellitus ([place]) 01/15/2014   Anemia 01/15/2014   Onychomycosis 03/26/2013   Pain in joint, ankle and foot 03/26/2013    Conditions to be addressed/monitored:Type 2 diabetes mellitus with microalbuminuria, without long-term current use of insulin, 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, ESRD (end stage renal disease) on dialysis   Care Plan : Assist with Chronic Care Management and Care  Coordination needs  Updates made by Lynne Logan, RN since 07/27/2021 12:00 AM     Problem: Assist with Chronic Care Management and Care Coordination needs   Priority: High     Goal: Assist with Chronic Care Management and Care Coordination needs   Start Date: 07/27/2021  Expected End Date: 08/31/2021  This Visit's Progress: On track  Note:   Current Barriers:  Chronic Disease Management support, education, chronic care management and care coordination needs related to Type 2 diabetes mellitus with microalbuminuria, without long-term current use of insulin, 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, ESRD (end stage renal disease) on dialysis with RNCM, SW and Pharmacy Care Management and Care coordination needs Case Manager Clinical Goal(s):  Patient will work with the CCM team to address needs related to chronic care management and care coordination needs related to Type 2 diabetes mellitus with microalbuminuria, without long-term current use of insulin, 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, ESRD (end stage renal disease) on dialysis with RNCM, SW and Pharmacy Care Management and Care Coordination needs Interventions:  Collaborated with BSW to initiate plan of care to address needs related to chronic care management and care coordination needs related to Type 2 diabetes mellitus with microalbuminuria, without long-term current use of insulin, 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, ESRD (end stage renal disease) on dialysis with RNCM, SW and Pharmacy Care Management and Care Coordination needs Collaboration with Glendale Chard, MD regarding development and update of comprehensive plan of care as evidenced by provider attestation and co-signature Inter-disciplinary care team collaboration (see longitudinal plan of care) Patient Goals/Self-Care Activities patient will:   - Patient will work with the CCM  team to address chronic care management and care coordination needs and will continue to work with the clinical team to address health care and disease management related needs.    Follow Up Plan: The care management team will reach out to the patient again over the next 30-45 days.      Plan:Telephone follow up appointment with care management team member scheduled for:  08/21/21  Barb Merino, RN, BSN, CCM Care Management Coordinator [facility]  Direct Phone: [phone]

## 2021-07-28 DIAGNOSIS — E876 Hypokalemia: Secondary | ICD-10-CM | POA: Diagnosis not present

## 2021-07-28 DIAGNOSIS — D689 Coagulation defect, unspecified: Secondary | ICD-10-CM | POA: Diagnosis not present

## 2021-07-28 DIAGNOSIS — R519 Headache, unspecified: Secondary | ICD-10-CM | POA: Diagnosis not present

## 2021-07-28 DIAGNOSIS — N2581 Secondary hyperparathyroidism of renal origin: Secondary | ICD-10-CM | POA: Diagnosis not present

## 2021-07-28 DIAGNOSIS — N186 End stage renal disease: Secondary | ICD-10-CM | POA: Diagnosis not present

## 2021-07-28 DIAGNOSIS — Z992 Dependence on renal dialysis: Secondary | ICD-10-CM | POA: Diagnosis not present

## 2021-07-28 DIAGNOSIS — E1129 Type 2 diabetes mellitus with other diabetic kidney complication: Secondary | ICD-10-CM | POA: Diagnosis not present

## 2021-07-30 ENCOUNTER — Ambulatory Visit: Payer: Medicare Other

## 2021-07-30 DIAGNOSIS — E1129 Type 2 diabetes mellitus with other diabetic kidney complication: Secondary | ICD-10-CM

## 2021-07-30 DIAGNOSIS — I132 Hypertensive heart and chronic kidney disease with heart failure and with stage 5 chronic kidney disease, or end stage renal disease: Secondary | ICD-10-CM

## 2021-07-30 NOTE — Patient Instructions (Signed)
Social Worker Visit Information  Goals we discussed today:   Goals Addressed             This Visit's Progress    Identify Resources to Improve Quality of Life       Timeframe:  Long-Range Goal Priority:  Medium Start Date:   8.26.22                          Expected End Date:  12.24.22               Next planned outreach: 9.7.22         Patient Goals/Self-Care Activities patient will:   - work with SW to identify resources to improve quality of life         Materials Provided: No. Patient not reached.  Follow Up Plan: SW will follow up with patient by phone over the next 14 days   Daneen Schick, BSW, CDP Social Worker, Certified Dementia Practitioner [place] / [place] [facility] [phone]

## 2021-07-30 NOTE — Chronic Care Management (AMB) (Signed)
Chronic Care Management    Social Work Note  07/30/2021 Name: Jacqueline [name] MRN: [id number] DOB: 25-Mar-1946  Jacqueline [name] is a 75 y.o. year old female who is a primary care patient of [name], [place]. The CCM team was consulted to assist the patient with chronic disease management and/or care coordination needs related to: [employer] .   Collaboration with [place] [facility]  for  resource identification  in response to provider referral for social work chronic care management and care coordination services.   Consent to Services:  The patient was given information about Chronic Care Management services, agreed to services, and gave verbal consent prior to initiation of services.  Please see initial visit note for detailed documentation.   Patient agreed to services and consent obtained.   Assessment: Review of patient past medical history, allergies, medications, and health status, including review of relevant consultants reports was performed today as part of a comprehensive evaluation and provision of chronic care management and care coordination services.     SDOH (Social Determinants of Health) assessments and interventions performed:    Advanced Directives Status: Not addressed in this encounter.  CCM Care Plan  Allergies  Allergen Reactions   Other Other (See Comments)   Sulfa Antibiotics Other (See Comments)   Sulfa Drugs Cross Reactors Other (See Comments)    Doesn't remember     Outpatient Encounter Medications as of 07/30/2021  Medication Sig   aspirin EC 81 MG EC tablet Take 1 tablet (81 mg total) by mouth daily.   AURYXIA 1 GM 210 MG(Fe) tablet Take 420 mg by mouth 2 (two) times a day.   B Complex-C-Folic Acid (DIALYVITE Q000111Q) 0.8 MG TABS Take 1 tablet by mouth daily.   benzoyl peroxide (BENZOYL PEROXIDE) 5 % external liquid Apply topically 2 (two) times daily.   diclofenac sodium (VOLTAREN) 1 % GEL Apply 2 g topically 4 (four) times daily.   ezetimibe (ZETIA)  10 MG tablet TAKE 1 TABLET BY MOUTH  DAILY  OVERDUE FOLLOW UP  APPOINTMENT NEEDED FOR  FURTHER REFILLS.   metoCLOPramide (REGLAN) 10 MG tablet TAKE 1 TABLET BY MOUTH  TWICE DAILY 30 MINUTES  BEFORE MEALS   midodrine (PROAMATINE) 10 MG tablet Take 10 mg by mouth 3 (three) times a week. Dialysis days [facility] Saturday   Omega-3 Fatty Acids (FISH OIL) 1000 MG CPDR Take 1,000 mg by mouth daily.    omeprazole (PRILOSEC) 20 MG capsule TAKE 1 CAPSULE BY MOUTH EVERY DAY BEFORE A MEAL   rosuvastatin (CRESTOR) 40 MG tablet Take 1 tablet (40 mg total) by mouth daily. Please schedule appointment for future refills. 3rd & FINAL attempt!   Vitamin D, Ergocalciferol, 50 MCG (2000 UT) CAPS Take 2,000 Units by mouth daily.   No facility-administered encounter medications on file as of 07/30/2021.    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 ; Housing barriers  Care Plan : General Social Work (Adult)  Updates made by Daneen Schick since  07/30/2021 12:00 AM     Problem: Quality of Life Improved      Long-Range Goal: Identify Resources to Improve Quality of Life   Start Date: 07/27/2021  Expected End Date: 11/24/2021  Priority: Medium  Note:   Current Barriers:  Chronic disease management support and education needs related to DM, ESRD, and 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   Inability to safely drink water or use for cooking Financial challenges related to utility costs  Social Worker Clinical Goal(s):  patient will work with SW to identify and address any acute and/or chronic care coordination needs related to the self health management of DM, ESRD, and 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   explore community resource options for unmet needs related [id number] constraints and Water Concerns   SW Interventions:  Inter-disciplinary care team collaboration (see longitudinal plan of care) Collaboration with [name], [place] regarding development and update of comprehensive plan of care as evidenced by provider attestation and co-signature Collaboration with Sharol Given with [employer] who indicates the agency is unable to assist with well repair Successful outbound call placed to 211 to receive resource information  Informed by 211 to attempt following resources: [employer] [phone]) - offers loans and grants [employer] [phone]) [place] [facility] [facility] [phone]) - National Program based out of [place] - low interest program patient would have to pay for [employer] [phone]) - loan forgiveness program Successful outbound call placed to [employer] - informed they do not assist with this need Unsuccessful outbound call placed to the patients home to determine if she is interested in applying for a grant and/or loan  Patient Goals/Self-Care Activities patient  will:   -  work with SW to identify resources to improve quality of life  Follow Up Plan:  SW will continue to follow       Follow Up Plan: SW will follow up with patient by phone over the next 14 days      Daneen Schick, BSW, CDP Social Worker, Certified Dementia Practitioner [place] [facility] [place] [facility] [phone]

## 2021-07-31 DIAGNOSIS — N186 End stage renal disease: Secondary | ICD-10-CM | POA: Diagnosis not present

## 2021-07-31 DIAGNOSIS — E876 Hypokalemia: Secondary | ICD-10-CM | POA: Diagnosis not present

## 2021-07-31 DIAGNOSIS — E1129 Type 2 diabetes mellitus with other diabetic kidney complication: Secondary | ICD-10-CM | POA: Diagnosis not present

## 2021-07-31 DIAGNOSIS — N2581 Secondary hyperparathyroidism of renal origin: Secondary | ICD-10-CM | POA: Diagnosis not present

## 2021-07-31 DIAGNOSIS — D689 Coagulation defect, unspecified: Secondary | ICD-10-CM | POA: Diagnosis not present

## 2021-07-31 DIAGNOSIS — R519 Headache, unspecified: Secondary | ICD-10-CM | POA: Diagnosis not present

## 2021-07-31 DIAGNOSIS — Z992 Dependence on renal dialysis: Secondary | ICD-10-CM | POA: Diagnosis not present

## 2021-08-01 DIAGNOSIS — Z992 Dependence on renal dialysis: Secondary | ICD-10-CM | POA: Diagnosis not present

## 2021-08-01 DIAGNOSIS — E1129 Type 2 diabetes mellitus with other diabetic kidney complication: Secondary | ICD-10-CM | POA: Diagnosis not present

## 2021-08-01 DIAGNOSIS — N186 End stage renal disease: Secondary | ICD-10-CM | POA: Diagnosis not present

## 2021-08-02 DIAGNOSIS — E876 Hypokalemia: Secondary | ICD-10-CM | POA: Diagnosis not present

## 2021-08-02 DIAGNOSIS — R519 Headache, unspecified: Secondary | ICD-10-CM | POA: Diagnosis not present

## 2021-08-02 DIAGNOSIS — Z992 Dependence on renal dialysis: Secondary | ICD-10-CM | POA: Diagnosis not present

## 2021-08-02 DIAGNOSIS — D689 Coagulation defect, unspecified: Secondary | ICD-10-CM | POA: Diagnosis not present

## 2021-08-02 DIAGNOSIS — N186 End stage renal disease: Secondary | ICD-10-CM | POA: Diagnosis not present

## 2021-08-02 DIAGNOSIS — N2581 Secondary hyperparathyroidism of renal origin: Secondary | ICD-10-CM | POA: Diagnosis not present

## 2021-08-04 DIAGNOSIS — N2581 Secondary hyperparathyroidism of renal origin: Secondary | ICD-10-CM | POA: Diagnosis not present

## 2021-08-04 DIAGNOSIS — Z992 Dependence on renal dialysis: Secondary | ICD-10-CM | POA: Diagnosis not present

## 2021-08-04 DIAGNOSIS — N186 End stage renal disease: Secondary | ICD-10-CM | POA: Diagnosis not present

## 2021-08-04 DIAGNOSIS — D689 Coagulation defect, unspecified: Secondary | ICD-10-CM | POA: Diagnosis not present

## 2021-08-04 DIAGNOSIS — E876 Hypokalemia: Secondary | ICD-10-CM | POA: Diagnosis not present

## 2021-08-04 DIAGNOSIS — R519 Headache, unspecified: Secondary | ICD-10-CM | POA: Diagnosis not present

## 2021-08-07 DIAGNOSIS — Z992 Dependence on renal dialysis: Secondary | ICD-10-CM | POA: Diagnosis not present

## 2021-08-07 DIAGNOSIS — D689 Coagulation defect, unspecified: Secondary | ICD-10-CM | POA: Diagnosis not present

## 2021-08-07 DIAGNOSIS — R519 Headache, unspecified: Secondary | ICD-10-CM | POA: Diagnosis not present

## 2021-08-07 DIAGNOSIS — N2581 Secondary hyperparathyroidism of renal origin: Secondary | ICD-10-CM | POA: Diagnosis not present

## 2021-08-07 DIAGNOSIS — E876 Hypokalemia: Secondary | ICD-10-CM | POA: Diagnosis not present

## 2021-08-07 DIAGNOSIS — N186 End stage renal disease: Secondary | ICD-10-CM | POA: Diagnosis not present

## 2021-08-08 ENCOUNTER — Telehealth: Payer: Self-pay

## 2021-08-08 ENCOUNTER — Telehealth: Payer: Medicare Other

## 2021-08-08 NOTE — Telephone Encounter (Signed)
  Care Management   Follow Up Note   08/08/2021 Name: Jacqueline [name] MRN: [id number] DOB: [birth year]   Referred by: [name], FNP Reason for referral : Chronic Care Management (Unsuccessful call)   An unsuccessful telephone outreach was attempted today. The patient was referred to the case management team for assistance with care management and care coordination. Unfortunately the patients voice mailbox was full which prohibited SW from leaving a voice message requesting a return call.  Follow Up Plan: The care management team will reach out to the patient again over the next 21 days.   Daneen Schick, BSW, CDP Social Worker, Certified Dementia Practitioner [place] [facility] [place] [facility] [phone]

## 2021-08-09 DIAGNOSIS — D689 Coagulation defect, unspecified: Secondary | ICD-10-CM | POA: Diagnosis not present

## 2021-08-09 DIAGNOSIS — Z992 Dependence on renal dialysis: Secondary | ICD-10-CM | POA: Diagnosis not present

## 2021-08-09 DIAGNOSIS — N186 End stage renal disease: Secondary | ICD-10-CM | POA: Diagnosis not present

## 2021-08-09 DIAGNOSIS — N2581 Secondary hyperparathyroidism of renal origin: Secondary | ICD-10-CM | POA: Diagnosis not present

## 2021-08-09 DIAGNOSIS — R519 Headache, unspecified: Secondary | ICD-10-CM | POA: Diagnosis not present

## 2021-08-09 DIAGNOSIS — E876 Hypokalemia: Secondary | ICD-10-CM | POA: Diagnosis not present

## 2021-08-11 DIAGNOSIS — D689 Coagulation defect, unspecified: Secondary | ICD-10-CM | POA: Diagnosis not present

## 2021-08-11 DIAGNOSIS — R519 Headache, unspecified: Secondary | ICD-10-CM | POA: Diagnosis not present

## 2021-08-11 DIAGNOSIS — Z992 Dependence on renal dialysis: Secondary | ICD-10-CM | POA: Diagnosis not present

## 2021-08-11 DIAGNOSIS — E876 Hypokalemia: Secondary | ICD-10-CM | POA: Diagnosis not present

## 2021-08-11 DIAGNOSIS — N186 End stage renal disease: Secondary | ICD-10-CM | POA: Diagnosis not present

## 2021-08-11 DIAGNOSIS — N2581 Secondary hyperparathyroidism of renal origin: Secondary | ICD-10-CM | POA: Diagnosis not present

## 2021-08-14 DIAGNOSIS — D689 Coagulation defect, unspecified: Secondary | ICD-10-CM | POA: Diagnosis not present

## 2021-08-14 DIAGNOSIS — N186 End stage renal disease: Secondary | ICD-10-CM | POA: Diagnosis not present

## 2021-08-14 DIAGNOSIS — E876 Hypokalemia: Secondary | ICD-10-CM | POA: Diagnosis not present

## 2021-08-14 DIAGNOSIS — R519 Headache, unspecified: Secondary | ICD-10-CM | POA: Diagnosis not present

## 2021-08-14 DIAGNOSIS — N2581 Secondary hyperparathyroidism of renal origin: Secondary | ICD-10-CM | POA: Diagnosis not present

## 2021-08-14 DIAGNOSIS — Z992 Dependence on renal dialysis: Secondary | ICD-10-CM | POA: Diagnosis not present

## 2021-08-16 DIAGNOSIS — N186 End stage renal disease: Secondary | ICD-10-CM | POA: Diagnosis not present

## 2021-08-16 DIAGNOSIS — E876 Hypokalemia: Secondary | ICD-10-CM | POA: Diagnosis not present

## 2021-08-16 DIAGNOSIS — N2581 Secondary hyperparathyroidism of renal origin: Secondary | ICD-10-CM | POA: Diagnosis not present

## 2021-08-16 DIAGNOSIS — R519 Headache, unspecified: Secondary | ICD-10-CM | POA: Diagnosis not present

## 2021-08-16 DIAGNOSIS — D689 Coagulation defect, unspecified: Secondary | ICD-10-CM | POA: Diagnosis not present

## 2021-08-16 DIAGNOSIS — Z992 Dependence on renal dialysis: Secondary | ICD-10-CM | POA: Diagnosis not present

## 2021-08-18 DIAGNOSIS — N186 End stage renal disease: Secondary | ICD-10-CM | POA: Diagnosis not present

## 2021-08-18 DIAGNOSIS — N2581 Secondary hyperparathyroidism of renal origin: Secondary | ICD-10-CM | POA: Diagnosis not present

## 2021-08-18 DIAGNOSIS — D689 Coagulation defect, unspecified: Secondary | ICD-10-CM | POA: Diagnosis not present

## 2021-08-18 DIAGNOSIS — Z992 Dependence on renal dialysis: Secondary | ICD-10-CM | POA: Diagnosis not present

## 2021-08-18 DIAGNOSIS — R519 Headache, unspecified: Secondary | ICD-10-CM | POA: Diagnosis not present

## 2021-08-18 DIAGNOSIS — E876 Hypokalemia: Secondary | ICD-10-CM | POA: Diagnosis not present

## 2021-08-20 ENCOUNTER — Telehealth: Payer: Self-pay

## 2021-08-20 ENCOUNTER — Telehealth: Payer: Medicare Other

## 2021-08-20 NOTE — Telephone Encounter (Signed)
  Care Management   Follow Up Note   08/20/2021 Name: Jacqueline [name] MRN: [id number] DOB: 02-10-1946   Referred by: [name], FNP Reason for referral : Chronic Care Management (Unsuccessful call)   A second unsuccessful telephone outreach was attempted today. The patient was referred to the case management team for assistance with care management and care coordination. SW was unable to leave a voice message requesting a return call due to the patients voice mailbox being full.  Follow Up Plan: The care management team will reach out to the patient again over the next 21 days.   Daneen Schick, BSW, CDP Social Worker, Certified Dementia Practitioner [place] [facility] [place] [facility] [phone]

## 2021-08-21 ENCOUNTER — Telehealth: Payer: Self-pay

## 2021-08-21 ENCOUNTER — Telehealth: Payer: Medicare Other

## 2021-08-21 ENCOUNTER — Other Ambulatory Visit: Payer: Self-pay | Admitting: Nurse Practitioner

## 2021-08-21 DIAGNOSIS — R519 Headache, unspecified: Secondary | ICD-10-CM | POA: Diagnosis not present

## 2021-08-21 DIAGNOSIS — E876 Hypokalemia: Secondary | ICD-10-CM | POA: Diagnosis not present

## 2021-08-21 DIAGNOSIS — N2581 Secondary hyperparathyroidism of renal origin: Secondary | ICD-10-CM | POA: Diagnosis not present

## 2021-08-21 DIAGNOSIS — D689 Coagulation defect, unspecified: Secondary | ICD-10-CM | POA: Diagnosis not present

## 2021-08-21 DIAGNOSIS — Z992 Dependence on renal dialysis: Secondary | ICD-10-CM | POA: Diagnosis not present

## 2021-08-21 DIAGNOSIS — N186 End stage renal disease: Secondary | ICD-10-CM | POA: Diagnosis not present

## 2021-08-21 NOTE — Telephone Encounter (Signed)
  Care Management   Follow Up Note   08/21/2021 Name: Jacqueline [name] MRN: [id number] DOB: October 27, 1946   Referred by: [name], FNP Reason for referral : Care Coordination (Initial RN CM Outreach )   An unsuccessful telephone outreach was attempted today. The patient was referred to the case management team for assistance with care management and care coordination.   Follow Up Plan: Telephone follow up appointment with care management team member scheduled for: 09/05/21  Barb Merino, RN, BSN, CCM Care Management Coordinator [place] [facility]  Direct Phone: [phone]

## 2021-08-22 DIAGNOSIS — M1712 Unilateral primary osteoarthritis, left knee: Secondary | ICD-10-CM | POA: Diagnosis not present

## 2021-08-22 DIAGNOSIS — M1711 Unilateral primary osteoarthritis, right knee: Secondary | ICD-10-CM | POA: Diagnosis not present

## 2021-08-23 DIAGNOSIS — R519 Headache, unspecified: Secondary | ICD-10-CM | POA: Diagnosis not present

## 2021-08-23 DIAGNOSIS — D689 Coagulation defect, unspecified: Secondary | ICD-10-CM | POA: Diagnosis not present

## 2021-08-23 DIAGNOSIS — E876 Hypokalemia: Secondary | ICD-10-CM | POA: Diagnosis not present

## 2021-08-23 DIAGNOSIS — N2581 Secondary hyperparathyroidism of renal origin: Secondary | ICD-10-CM | POA: Diagnosis not present

## 2021-08-23 DIAGNOSIS — Z992 Dependence on renal dialysis: Secondary | ICD-10-CM | POA: Diagnosis not present

## 2021-08-23 DIAGNOSIS — N186 End stage renal disease: Secondary | ICD-10-CM | POA: Diagnosis not present

## 2021-08-25 DIAGNOSIS — N2581 Secondary hyperparathyroidism of renal origin: Secondary | ICD-10-CM | POA: Diagnosis not present

## 2021-08-25 DIAGNOSIS — N186 End stage renal disease: Secondary | ICD-10-CM | POA: Diagnosis not present

## 2021-08-25 DIAGNOSIS — Z992 Dependence on renal dialysis: Secondary | ICD-10-CM | POA: Diagnosis not present

## 2021-08-25 DIAGNOSIS — D689 Coagulation defect, unspecified: Secondary | ICD-10-CM | POA: Diagnosis not present

## 2021-08-25 DIAGNOSIS — E876 Hypokalemia: Secondary | ICD-10-CM | POA: Diagnosis not present

## 2021-08-25 DIAGNOSIS — R519 Headache, unspecified: Secondary | ICD-10-CM | POA: Diagnosis not present

## 2021-08-28 DIAGNOSIS — E876 Hypokalemia: Secondary | ICD-10-CM | POA: Diagnosis not present

## 2021-08-28 DIAGNOSIS — Z992 Dependence on renal dialysis: Secondary | ICD-10-CM | POA: Diagnosis not present

## 2021-08-28 DIAGNOSIS — D689 Coagulation defect, unspecified: Secondary | ICD-10-CM | POA: Diagnosis not present

## 2021-08-28 DIAGNOSIS — N2581 Secondary hyperparathyroidism of renal origin: Secondary | ICD-10-CM | POA: Diagnosis not present

## 2021-08-28 DIAGNOSIS — R519 Headache, unspecified: Secondary | ICD-10-CM | POA: Diagnosis not present

## 2021-08-28 DIAGNOSIS — N186 End stage renal disease: Secondary | ICD-10-CM | POA: Diagnosis not present

## 2021-08-29 ENCOUNTER — Encounter: Payer: Self-pay | Admitting: Podiatry

## 2021-08-29 ENCOUNTER — Other Ambulatory Visit: Payer: Self-pay

## 2021-08-29 ENCOUNTER — Ambulatory Visit (INDEPENDENT_AMBULATORY_CARE_PROVIDER_SITE_OTHER): Payer: Medicare Other | Admitting: Podiatry

## 2021-08-29 ENCOUNTER — Ambulatory Visit (HOSPITAL_COMMUNITY)
Admission: EM | Admit: 2021-08-29 | Discharge: 2021-08-29 | Disposition: A | Discharge status: Home / Self Care | Payer: Medicare Other | Attending: Emergency Medicine | Admitting: Emergency Medicine

## 2021-08-29 ENCOUNTER — Encounter (HOSPITAL_COMMUNITY): Payer: Self-pay | Admitting: *Deleted

## 2021-08-29 DIAGNOSIS — M542 Cervicalgia: Secondary | ICD-10-CM | POA: Diagnosis not present

## 2021-08-29 DIAGNOSIS — Z992 Dependence on renal dialysis: Secondary | ICD-10-CM

## 2021-08-29 DIAGNOSIS — B351 Tinea unguium: Secondary | ICD-10-CM

## 2021-08-29 DIAGNOSIS — Z882 Allergy status to sulfonamides status: Secondary | ICD-10-CM | POA: Insufficient documentation

## 2021-08-29 DIAGNOSIS — Z794 Long term (current) use of insulin: Secondary | ICD-10-CM | POA: Diagnosis not present

## 2021-08-29 DIAGNOSIS — E1159 Type 2 diabetes mellitus with other circulatory complications: Secondary | ICD-10-CM | POA: Diagnosis not present

## 2021-08-29 DIAGNOSIS — R5383 Other fatigue: Secondary | ICD-10-CM | POA: Diagnosis not present

## 2021-08-29 DIAGNOSIS — D689 Coagulation defect, unspecified: Secondary | ICD-10-CM | POA: Diagnosis not present

## 2021-08-29 DIAGNOSIS — M79675 Pain in left toe(s): Secondary | ICD-10-CM

## 2021-08-29 DIAGNOSIS — Z7982 Long term (current) use of aspirin: Secondary | ICD-10-CM | POA: Diagnosis not present

## 2021-08-29 DIAGNOSIS — J069 Acute upper respiratory infection, unspecified: Secondary | ICD-10-CM | POA: Insufficient documentation

## 2021-08-29 DIAGNOSIS — Z87891 Personal history of nicotine dependence: Secondary | ICD-10-CM | POA: Diagnosis not present

## 2021-08-29 DIAGNOSIS — Z20822 Contact with and (suspected) exposure to covid-19: Secondary | ICD-10-CM | POA: Insufficient documentation

## 2021-08-29 DIAGNOSIS — R059 Cough, unspecified: Secondary | ICD-10-CM | POA: Diagnosis not present

## 2021-08-29 DIAGNOSIS — M79674 Pain in right toe(s): Secondary | ICD-10-CM

## 2021-08-29 DIAGNOSIS — N186 End stage renal disease: Secondary | ICD-10-CM | POA: Diagnosis not present

## 2021-08-29 LAB — POC INFLUENZA A AND B ANTIGEN (URGENT CARE ONLY)
INFLUENZA A ANTIGEN, POC: NEGATIVE
INFLUENZA B ANTIGEN, POC: NEGATIVE

## 2021-08-29 NOTE — ED Triage Notes (Signed)
Pt reports BP has been low and she ha been taking med for low BP before dialysis.

## 2021-08-29 NOTE — ED Triage Notes (Signed)
Pt reports a week ago the RT ear pain started with neck pain. Pt also reports when she gets the room spins. Pt also has fatigue.

## 2021-08-29 NOTE — Discharge Instructions (Addendum)
Your flu test today is negative, your COVID test result will be provided to you once it is received within the next 12 to 24 hours.  At this time, I feel that you have a nonspecific mild upper respiratory infection and conservative care with clear fluids, rest, eating as tolerated and Tylenol is indicated.  Should she began to have worsening your pain, fever, nausea, vomiting, diarrhea, sore throat, intractable cough, loss of taste or smell, please return for repeat evaluation.  Please remember to always wear your mask when at dialysis, I do not believe that you are contagious and you should not have any restrictions and attending those appointments.

## 2021-08-29 NOTE — Progress Notes (Signed)
This patient returns to my office for at risk foot care.  This patient requires this care by a professional since this patient will be at risk due to having  Diabetes.  This patient is unable to cut nails himself since the patient cannot reach his nails.These nails are painful walking and wearing shoes.  This patient presents for at risk foot care today.  General Appearance  Alert, conversant and in no acute stress.  Vascular  Dorsalis pedis and posterior tibial  pulses are not  palpable  bilaterally.  Capillary return is within normal limits  bilaterally. Temperature is within normal limits  bilaterally.  Neurologic  Senn-Weinstein monofilament wire test within normal limits  bilaterally. Muscle power within normal limits bilaterally.  Nails Thick disfigured discolored nails with subungual debris  from hallux to fifth toes bilaterally. No evidence of bacterial infection or drainage bilaterally.  Orthopedic  No limitations of motion  feet .  No crepitus or effusions noted.  No bony pathology or digital deformities noted.  Skin  normotropic skin with no porokeratosis noted bilaterally.  No signs of infections or ulcers noted. Asymptomatic callus left hallux.   Onychomycosis  Pain in right toes  Pain in left toes    Consent was obtained for treatment procedures.   Mechanical debridement of nails 1-5  bilaterally performed with a nail nipper.  Filed with dremel without incident.     Return office visit     4 months                 Told patient to return for periodic foot care and evaluation due to potential at risk complications.   Gardiner Barefoot DPM

## 2021-08-29 NOTE — ED Provider Notes (Signed)
[facility]    CSN: [id number] Arrival date & time: 08/29/21  1704      History   Chief Complaint Chief Complaint  Patient presents with   Neck Pain   Otalgia   Fatigue    HPI Jacqueline [name] is a 75 y.o. female.   Patient complains of feeling fatigued and having a cough, states she routinely goes to dialysis and is around a lot of sick people, states she like to be tested for COVID today.  Patient denies fever, sore throat, loss of taste or smell, as of breath.  Patient states that the cough is not productive, is not worse at night, is intermittent.  Patient states that her left ear is also feeling a little bit sore today, denies hearing loss, drainage from ear, pain radiating to jaw or neck.  The history is provided by the patient.   Past Medical History:  Diagnosis Date   Arthritis    "left knee" (12/04/2016)   CAD (coronary artery disease)    a. 12/2016 NSTEMI/PCI: LM nl, LaD 20p/d, D1 20, OM2 20, OM3 20, RCA 25m(3.0x18 Resolute DES). EF 65%.   Chronic lower back pain    Diverticulosis    on CT   ESRD on dialysis ([facility]    "[place]" (12/04/2016)   GERD (gastroesophageal reflux disease)    Hiatal hernia    Hyperlipidemia    Hypertension    a. 12/2016 labetalol d/c'd 2/2 hypotension in HD.   Morbid obesity ([place]    Obesity    Pneumonia    "couple times" (12/04/2016)   Renal insufficiency    Type II diabetes mellitus ([place]     Patient Active Problem List   Diagnosis Date Noted   Anaphylactic shock, unspecified, initial encounter 06/29/2020   Sepsis ([place] 06/22/2019   Pain due to onychomycosis of toenails of both feet 06/11/2019   Callus 06/11/2019   Other disorders of phosphorus metabolism 05/20/2019   Mixed hyperlipidemia 03/22/2019   Otalgia of both ears 03/22/2019   Right arm pain 03/22/2019   Bradycardia 10/01/2017   Dyslipidemia 10/01/2017   Dependence on renal dialysis ([place] 09/01/2017   Chest pain, unspecified  12/06/2016   Non-ST elevation (NSTEMI) myocardial infarction ([place] 12/06/2016   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   History of NSTEMI    CAD S/P percutaneous coronary angioplasty 12/03/2016   Hypokalemia 05/03/2016   Pruritus, unspecified 03/27/2015   Encounter for removal of sutures 11/30/2014   Pain in lower limb 08/26/2014   Encounter for immunization 06/10/2014   Unspecified protein-calorie malnutrition ([place] 02/28/2014   Allergy, unspecified, initial encounter 02/15/2014   HCAP (healthcare-associated pneumonia) 02/13/2014   Absence of both cervix and uterus, acquired 02/01/2014   Coagulation defect, unspecified ([place] 02/01/2014   Dyspnea, unspecified 02/01/2014   Family history of diabetes mellitus 02/01/2014   Family history of ischemic heart disease and other diseases of the circulatory system 02/01/2014   Fever, unspecified 02/01/2014   Headache, unspecified 02/01/2014   History of allergy to sulfonamides 02/01/2014   Iron deficiency anemia, unspecified 02/01/2014   Long term (current) use of insulin ([place] 02/01/2014   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[place] 01/28/2014   OSA (obstructive sleep apnea) 01/21/2014   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[facility] 01/21/2014   Acute encephalopathy 01/19/2014   Congestive heart failure ([place] 01/15/2014   ESRD (end stage renal disease) on dialysis ([place] 01/15/2014   Hypertension 01/15/2014   Diabetes mellitus ([place] 01/15/2014   Anemia 01/15/2014   Onychomycosis  03/26/2013   Pain in joint, ankle and foot 03/26/2013    Past Surgical History:  Procedure Laterality Date   ABDOMINAL HYSTERECTOMY     AV FISTULA PLACEMENT Left 01/27/2014   Procedure: ARTERIOVENOUS (AV) FISTULA CREATION;  Surgeon: Mal Misty, MD;  Location: [facility];  Service: Vascular;  Laterality: Left;   AV FISTULA REPAIR     "had it cleaned out"   CARDIAC CATHETERIZATION  ?[place] N/A 12/04/2016   Procedure: Left Heart Cath Angiography;   Surgeon: Burnell Blanks, MD;  Location: [facility];  Service: Cardiovascular;  Laterality: N/A;   CARDIAC CATHETERIZATION N/A 12/04/2016   Procedure: Coronary Stent Intervention;  Surgeon: Burnell Blanks, MD;  Location: [facility];  Service: Cardiovascular;  Laterality: N/A;  Mid RCA   CATARACT EXTRACTION W/ INTRAOCULAR LENS IMPLANT Left    COLONOSCOPY W/ BIOPSIES AND POLYPECTOMY     CORONARY ANGIOPLASTY WITH STENT PLACEMENT  12/04/2016   ESOPHAGOGASTRODUODENOSCOPY     INSERTION OF DIALYSIS CATHETER N/A 01/19/2014   Procedure: INSERTION OF DIALYSIS CATHETER;  Surgeon: Rosetta Posner, MD;  [facility];  Service: Vascular;  Laterality: N/A;    OB History   No obstetric history on file.      Home Medications    Prior to Admission medications   Medication Sig Start Date End Date Taking? Authorizing Provider  aspirin 81 MG EC tablet Take by mouth. 12/06/16   [provider]  aspirin EC 81 MG EC tablet Take 1 tablet (81 mg total) by mouth daily. 12/06/16   Geradine Girt, DO  AURYXIA 1 GM 210 MG(Fe) tablet Take 420 mg by mouth 2 (two) times a day. 06/08/19   [provider]  B Complex-C-Folic Acid (DIALYVITE Q000111Q) 0.8 MG TABS Take 1 tablet by mouth daily. 09/29/18   [provider]  B Complex-C-Zn-Folic Acid (DIALYVITE/ZINC) TABS Take 1 tablet by mouth daily. 06/21/21   [provider]  benzoyl peroxide (BENZOYL PEROXIDE) 5 % external liquid Apply topically 2 (two) times daily. 11/20/18   [name], FNP  cinacalcet (SENSIPAR) 30 MG tablet Take 30 mg by mouth daily. 08/23/21   [provider]  diclofenac sodium (VOLTAREN) 1 % GEL Apply 2 g topically 4 (four) times daily. 03/22/19   [name], FNP  diclofenac Sodium (VOLTAREN) 1 % GEL SMARTSIG:2 Gram(s) Topical 4 Times Daily PRN 08/21/21   [provider]  Doxercalciferol (HECTOROL IV) Doxercalciferol (Hectorol) 08/14/21 08/13/22  [provider]   ezetimibe (ZETIA) 10 MG tablet TAKE 1 TABLET BY MOUTH  DAILY  OVERDUE FOLLOW UP  APPOINTMENT NEEDED FOR  FURTHER REFILLS. 04/13/21   Sueanne Margarita, MD  metoCLOPramide (REGLAN) 10 MG tablet TAKE 1 TABLET BY MOUTH  TWICE DAILY 30 MINUTES  BEFORE MEALS 05/29/21   [name], FNP  midodrine (PROAMATINE) 10 MG tablet Take 10 mg by mouth 3 (three) times a week. Dialysis days [facility] Saturday    [provider]  Omega-3 Fatty Acids (FISH OIL) 1000 MG CPDR Take 1,000 mg by mouth daily.     [provider]  omeprazole (PRILOSEC) 20 MG capsule TAKE 1 CAPSULE BY MOUTH EVERY DAY BEFORE A MEAL 08/22/21   [name], FNP  rosuvastatin (CRESTOR) 40 MG tablet Take 1 tablet (40 mg total) by mouth daily. Please schedule appointment for future refills. 3rd & FINAL attempt! 05/07/21   Sueanne Margarita, MD  Vitamin D, Ergocalciferol, 50 MCG (2000  UT) CAPS Take 2,000 Units by mouth daily.    [provider]    Family History Family History  Problem Relation Age of Onset   Diabetes Mother    Peripheral vascular disease Mother        amputation   CAD Father    Heart disease Father    Hypertension Father    Heart attack Father    CAD Sister    Cancer Brother        x 2 brothers, type unknown   Breast cancer Sister     Social History Social History   Tobacco Use   Smoking status: Former    Packs/day: 1.00    Years: 20.00    Pack years: 20.00    Types: Cigarettes    Quit date: 1985    Years since quitting: 37.7   Smokeless tobacco: Never  Vaping Use   Vaping Use: Never used  Substance Use Topics   Alcohol use: No    Alcohol/week: 0.0 standard drinks   Drug use: No     Allergies   Other, Sulfa antibiotics, and Sulfa drugs cross reactors   Review of Systems Review of Systems Per HPI  Physical Exam Triage Vital Signs ED Triage Vitals  Enc Vitals Group     BP 08/29/21 1751 (!) 94/48     Pulse Rate 08/29/21 1732 83     Resp 08/29/21 1732 20     Temp  08/29/21 1732 98.3 F (36.8 C)     Temp src --      SpO2 08/29/21 1732 97 %     Weight --      Height --      Head Circumference --      Peak Flow --      Pain Score 08/29/21 1728 6     Pain Loc --      Pain Edu? --      Excl. in [place]? --    No data found.  Updated Vital Signs BP (!) 94/48 (BP Location: Right Arm)   Pulse 83   Temp 98.3 F (36.8 C)   Resp 20   SpO2 97%   Visual Acuity Right Eye Distance:   Left Eye Distance:   Bilateral Distance:    Right Eye Near:   Left Eye Near:    Bilateral Near:     Physical Exam Vitals and nursing note reviewed.  Constitutional:      Appearance: Normal appearance. She is obese.  HENT:     Head: Normocephalic and atraumatic.     Right Ear: Tympanic membrane, ear canal and external ear normal.     Left Ear: Tympanic membrane, ear canal and external ear normal.     Nose: Nose normal.     Mouth/Throat:     Mouth: Mucous membranes are moist.     Pharynx: Oropharynx is clear.  Eyes:     Extraocular Movements: Extraocular movements intact.     Conjunctiva/sclera: Conjunctivae normal.     Pupils: Pupils are equal, round, and reactive to light.  Cardiovascular:     Rate and Rhythm: Normal rate and regular rhythm.     Pulses: Normal pulses.     Heart sounds: Normal heart sounds.  Pulmonary:     Effort: Pulmonary effort is normal.     Breath sounds: Normal breath sounds.  Abdominal:     General: Abdomen is flat. Bowel sounds are normal.     Palpations: Abdomen is soft.  Musculoskeletal:  General: Normal range of motion.     Cervical back: Normal range of motion and neck supple.  Skin:    General: Skin is warm and dry.  Neurological:     General: No focal deficit present.     Mental Status: She is alert and oriented to person, place, and time. Mental status is at baseline.  Psychiatric:        Mood and Affect: Mood normal.        Behavior: Behavior normal.     UC Treatments / Results  Labs (all labs ordered are  listed, but only abnormal results are displayed) Labs Reviewed - No data to display  EKG   Radiology No results found.  Procedures Procedures (including critical care time)  Medications Ordered in UC Medications - No data to display  Initial Impression / Assessment and Plan / UC Course  I have reviewed the triage vital signs and the nursing notes.  Pertinent labs & imaging results that were available during my care of the patient were reviewed by me and considered in my medical decision making (see chart for details).    Patient's rapid flu test today is negative.  Patient vies that she will be notified of her COVID test result once it is available in the next 12 to 24 hours.  Conservative care recommended with clear fluids, Tylenol as needed.  Patient advised to return for worsening ear pain, worsening upper respiratory symptoms, fever.  Patient verbalized understanding and agreed with plan, all questions were addressed during visit.  Final Clinical Impressions(s) / UC Diagnoses   Final diagnoses:  None   Discharge Instructions   None    ED Prescriptions   None    PDMP not reviewed this encounter.   Lynden Oxford Scales, PA-C 08/29/21 1859

## 2021-08-30 ENCOUNTER — Telehealth: Payer: Medicare Other

## 2021-08-30 ENCOUNTER — Ambulatory Visit (INDEPENDENT_AMBULATORY_CARE_PROVIDER_SITE_OTHER): Payer: Medicare Other

## 2021-08-30 DIAGNOSIS — R519 Headache, unspecified: Secondary | ICD-10-CM | POA: Diagnosis not present

## 2021-08-30 DIAGNOSIS — N2581 Secondary hyperparathyroidism of renal origin: Secondary | ICD-10-CM | POA: Diagnosis not present

## 2021-08-30 DIAGNOSIS — E1129 Type 2 diabetes mellitus with other diabetic kidney complication: Secondary | ICD-10-CM

## 2021-08-30 DIAGNOSIS — N186 End stage renal disease: Secondary | ICD-10-CM

## 2021-08-30 DIAGNOSIS — I132 Hypertensive heart and chronic kidney disease with heart failure and with stage 5 chronic kidney disease, or end stage renal disease: Secondary | ICD-10-CM

## 2021-08-30 DIAGNOSIS — D689 Coagulation defect, unspecified: Secondary | ICD-10-CM | POA: Diagnosis not present

## 2021-08-30 DIAGNOSIS — Z992 Dependence on renal dialysis: Secondary | ICD-10-CM | POA: Diagnosis not present

## 2021-08-30 DIAGNOSIS — E876 Hypokalemia: Secondary | ICD-10-CM | POA: Diagnosis not present

## 2021-08-30 LAB — SARS CORONAVIRUS 2 (TAT 6-24 HRS): SARS Coronavirus 2: NEGATIVE

## 2021-08-30 NOTE — Chronic Care Management (AMB) (Signed)
Chronic Care Management    Social Work Note  08/30/2021 Name: Jacqueline [name] MRN: [id number] DOB: 11/21/1946  [name] is a 75 y.o. year old female who is a primary care patient of [name], [place]. 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   Third unsuccessful call placed to the patient  for follow up visit in response to provider referral for social work chronic care management and care coordination services.   Consent to Services:  The patient was given information about Chronic Care Management services, agreed to services, and gave verbal consent prior to initiation of services.  Please see initial visit note for detailed documentation.   Patient agreed to services and consent obtained.   Assessment: Review of patient past medical history, allergies, medications, and health status, including review of relevant consultants reports was performed today as part of a comprehensive evaluation and provision of chronic care management and care coordination services.     SW performed goal closure due to inability to maintain patient contact. SW is available to assist with future care coordination needs should the patient return this writers call.  CCM Care Plan  Allergies  Allergen Reactions   Other Other (See Comments)   Sulfa Antibiotics Other (See Comments)   Sulfa Drugs Cross Reactors Other (See Comments)    Doesn't remember     Outpatient Encounter Medications as of 08/30/2021  Medication Sig   aspirin 81 MG EC tablet Take by mouth.   aspirin EC 81 MG EC tablet Take 1 tablet (81 mg total) by mouth daily.   AURYXIA 1 GM 210 MG(Fe) tablet Take 420 mg by mouth 2 (two) times a day.   B Complex-C-Folic Acid (DIALYVITE Q000111Q) 0.8 MG TABS Take 1 tablet by mouth daily.   B Complex-C-Zn-Folic Acid (DIALYVITE/ZINC) TABS Take  1 tablet by mouth daily.   benzoyl peroxide (BENZOYL PEROXIDE) 5 % external liquid Apply topically 2 (two) times daily.   cinacalcet (SENSIPAR) 30 MG tablet Take 30 mg by mouth daily.   diclofenac sodium (VOLTAREN) 1 % GEL Apply 2 g topically 4 (four) times daily.   diclofenac Sodium (VOLTAREN) 1 % GEL SMARTSIG:2 Gram(s) Topical 4 Times Daily PRN   Doxercalciferol (HECTOROL IV) Doxercalciferol (Hectorol)   ezetimibe (ZETIA) 10 MG tablet TAKE 1 TABLET BY MOUTH  DAILY  OVERDUE FOLLOW UP  APPOINTMENT NEEDED FOR  FURTHER REFILLS.   metoCLOPramide (REGLAN) 10 MG tablet TAKE 1 TABLET BY MOUTH  TWICE DAILY 30 MINUTES  BEFORE MEALS   midodrine (PROAMATINE) 10 MG tablet Take 10 mg by mouth 3 (three) times a week. Dialysis days [place] Saturday   Omega-3 Fatty Acids (FISH OIL) 1000 MG CPDR Take 1,000 mg by mouth daily.    omeprazole (PRILOSEC) 20 MG capsule TAKE 1 CAPSULE BY MOUTH EVERY DAY BEFORE A MEAL   rosuvastatin (CRESTOR) 40 MG tablet Take 1 tablet (40 mg total) by mouth daily. Please schedule appointment for future refills. 3rd & FINAL attempt!   Vitamin D, Ergocalciferol, 50 MCG (2000 UT) CAPS Take 2,000 Units by mouth daily.   No facility-administered encounter medications on file as of 08/30/2021.    Patient Active Problem List   Diagnosis Date Noted   Anaphylactic shock, unspecified, initial encounter 06/29/2020   Sepsis ([place]) 06/22/2019   Pain due to onychomycosis of toenails of both  feet 06/11/2019   Callus 06/11/2019   Other disorders of phosphorus metabolism 05/20/2019   Mixed hyperlipidemia 03/22/2019   Otalgia of both ears 03/22/2019   Right arm pain 03/22/2019   Bradycardia 10/01/2017   Dyslipidemia 10/01/2017   Dependence on renal dialysis ([place]) 09/01/2017   Chest pain, unspecified 12/06/2016   Non-ST elevation (NSTEMI) myocardial infarction ([place]) 12/06/2016   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   History of NSTEMI    CAD S/P percutaneous coronary angioplasty 12/03/2016    Hypokalemia 05/03/2016   Pruritus, unspecified 03/27/2015   Encounter for removal of sutures 11/30/2014   Pain in lower limb 08/26/2014   Encounter for immunization 06/10/2014   Unspecified protein-calorie malnutrition ([place]) 02/28/2014   Allergy, unspecified, initial encounter 02/15/2014   HCAP (healthcare-associated pneumonia) 02/13/2014   Absence of both cervix and uterus, acquired 02/01/2014   Coagulation defect, unspecified ([place]) 02/01/2014   Dyspnea, unspecified 02/01/2014   Family history of diabetes mellitus 02/01/2014   Family history of ischemic heart disease and other diseases of the circulatory system 02/01/2014   Fever, unspecified 02/01/2014   Headache, unspecified 02/01/2014   History of allergy to sulfonamides 02/01/2014   Iron deficiency anemia, unspecified 02/01/2014   Long term (current) use of insulin ([place]) 02/01/2014   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   OSA (obstructive sleep apnea) 01/21/2014   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[place]) 01/21/2014   Acute encephalopathy 01/19/2014   Congestive heart failure ([place]) 01/15/2014   ESRD (end stage renal disease) on dialysis ([place]) 01/15/2014   Hypertension 01/15/2014   Diabetes mellitus ([place]) 01/15/2014   Anemia 01/15/2014   Onychomycosis 03/26/2013   Pain in joint, ankle and foot 03/26/2013    Conditions to be addressed/monitored: CHF, DMII, and ESRD  Care Plan : General Social Work (Adult)  Updates made by Daneen Schick since 08/30/2021 12:00 AM  Completed 08/30/2021   Problem: Quality of Life Improved Resolved 08/30/2021     Long-Range Goal: Identify Resources to Improve Quality of Life Completed 08/30/2021  Start Date: 07/27/2021  Expected End Date: 11/24/2021  Priority: Medium  Note:   Current Barriers:  Chronic disease management support and education needs related to DM, ESRD, and 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   Inability to safely drink water or use for  cooking Financial challenges related to utility costs  Social Worker Clinical Goal(s):  patient will work with SW to identify and address any acute and/or chronic care coordination needs related to the self health management of DM, ESRD, and 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   explore community resource options for unmet needs related [id number] constraints and Water Concerns   Goal Closed due to inability to maintain patient contact   SW Interventions:  Inter-disciplinary care team collaboration (see longitudinal plan of care) Collaboration with [name], FNP regarding development and update of comprehensive plan of care as evidenced by provider attestation and co-signature Collaboration with Sharol Given with [employer] who indicates the agency is unable to assist with well repair Successful outbound call placed to 211 to receive resource information  Informed by 211 to attempt following resources: [employer] [phone]) - offers loans and grants [employer] [phone]) [place] [employer] [facility] [phone]) - National Program based out of [place] - low interest program patient would have to pay for [employer] [phone]) - loan forgiveness program Successful outbound call placed to [facility] - informed they do not assist with this need  Unsuccessful outbound call placed to the patients home to determine if she is interested in applying for a grant and/or loan  Patient Goals/Self-Care Activities patient will:   -  work with SW to identify resources to improve quality of life  Follow Up Plan:  SW will continue to follow       Follow Up Plan:  No SW follow up planned at this time.      Daneen Schick, BSW, CDP Social Worker, Certified Dementia Practitioner [place] [facility] [place] [phone]

## 2021-08-30 NOTE — Telephone Encounter (Signed)
Error

## 2021-08-31 DIAGNOSIS — Z992 Dependence on renal dialysis: Secondary | ICD-10-CM | POA: Diagnosis not present

## 2021-08-31 DIAGNOSIS — R809 Proteinuria, unspecified: Secondary | ICD-10-CM | POA: Diagnosis not present

## 2021-08-31 DIAGNOSIS — N186 End stage renal disease: Secondary | ICD-10-CM

## 2021-08-31 DIAGNOSIS — I132 Hypertensive heart and chronic kidney disease with heart failure and with stage 5 chronic kidney disease, or end stage renal disease: Secondary | ICD-10-CM

## 2021-08-31 DIAGNOSIS — E1129 Type 2 diabetes mellitus with other diabetic kidney complication: Secondary | ICD-10-CM

## 2021-09-01 DIAGNOSIS — Z992 Dependence on renal dialysis: Secondary | ICD-10-CM | POA: Diagnosis not present

## 2021-09-01 DIAGNOSIS — N2581 Secondary hyperparathyroidism of renal origin: Secondary | ICD-10-CM | POA: Diagnosis not present

## 2021-09-01 DIAGNOSIS — R519 Headache, unspecified: Secondary | ICD-10-CM | POA: Diagnosis not present

## 2021-09-01 DIAGNOSIS — E876 Hypokalemia: Secondary | ICD-10-CM | POA: Diagnosis not present

## 2021-09-01 DIAGNOSIS — N186 End stage renal disease: Secondary | ICD-10-CM | POA: Diagnosis not present

## 2021-09-01 DIAGNOSIS — E1129 Type 2 diabetes mellitus with other diabetic kidney complication: Secondary | ICD-10-CM | POA: Diagnosis not present

## 2021-09-01 DIAGNOSIS — Z23 Encounter for immunization: Secondary | ICD-10-CM | POA: Diagnosis not present

## 2021-09-01 DIAGNOSIS — D689 Coagulation defect, unspecified: Secondary | ICD-10-CM | POA: Diagnosis not present

## 2021-09-04 DIAGNOSIS — R519 Headache, unspecified: Secondary | ICD-10-CM | POA: Diagnosis not present

## 2021-09-04 DIAGNOSIS — N186 End stage renal disease: Secondary | ICD-10-CM | POA: Diagnosis not present

## 2021-09-04 DIAGNOSIS — Z992 Dependence on renal dialysis: Secondary | ICD-10-CM | POA: Diagnosis not present

## 2021-09-04 DIAGNOSIS — D689 Coagulation defect, unspecified: Secondary | ICD-10-CM | POA: Diagnosis not present

## 2021-09-04 DIAGNOSIS — N2581 Secondary hyperparathyroidism of renal origin: Secondary | ICD-10-CM | POA: Diagnosis not present

## 2021-09-04 DIAGNOSIS — Z23 Encounter for immunization: Secondary | ICD-10-CM | POA: Diagnosis not present

## 2021-09-04 DIAGNOSIS — E1129 Type 2 diabetes mellitus with other diabetic kidney complication: Secondary | ICD-10-CM | POA: Diagnosis not present

## 2021-09-04 DIAGNOSIS — E876 Hypokalemia: Secondary | ICD-10-CM | POA: Diagnosis not present

## 2021-09-05 ENCOUNTER — Telehealth: Payer: Medicare Other

## 2021-09-05 ENCOUNTER — Telehealth: Payer: Self-pay

## 2021-09-05 NOTE — Telephone Encounter (Signed)
  Care Management   Follow Up Note   09/05/2021 Name: Jacqueline [name] MRN: [id number] DOB: Dec 28, 1945   Referred by: [name], FNP Reason for referral : Chronic Care Management (Initial RN CM Outreach - 2nd attempt )   A second unsuccessful telephone outreach was attempted today. The patient was referred to the case management team for assistance with care management and care coordination.   Follow Up Plan: Telephone follow up appointment with care management team member scheduled for: 10/03/21  Barb Merino, RN, BSN, CCM Care Management Coordinator [place] [facility]  Direct Phone: [phone]

## 2021-09-05 NOTE — Progress Notes (Signed)
This encounter was created in error - please disregard.

## 2021-09-06 DIAGNOSIS — E1129 Type 2 diabetes mellitus with other diabetic kidney complication: Secondary | ICD-10-CM | POA: Diagnosis not present

## 2021-09-06 DIAGNOSIS — Z23 Encounter for immunization: Secondary | ICD-10-CM | POA: Diagnosis not present

## 2021-09-06 DIAGNOSIS — Z992 Dependence on renal dialysis: Secondary | ICD-10-CM | POA: Diagnosis not present

## 2021-09-06 DIAGNOSIS — E876 Hypokalemia: Secondary | ICD-10-CM | POA: Diagnosis not present

## 2021-09-06 DIAGNOSIS — N186 End stage renal disease: Secondary | ICD-10-CM | POA: Diagnosis not present

## 2021-09-06 DIAGNOSIS — N2581 Secondary hyperparathyroidism of renal origin: Secondary | ICD-10-CM | POA: Diagnosis not present

## 2021-09-06 DIAGNOSIS — D689 Coagulation defect, unspecified: Secondary | ICD-10-CM | POA: Diagnosis not present

## 2021-09-06 DIAGNOSIS — R519 Headache, unspecified: Secondary | ICD-10-CM | POA: Diagnosis not present

## 2021-09-08 DIAGNOSIS — N186 End stage renal disease: Secondary | ICD-10-CM | POA: Diagnosis not present

## 2021-09-08 DIAGNOSIS — D689 Coagulation defect, unspecified: Secondary | ICD-10-CM | POA: Diagnosis not present

## 2021-09-08 DIAGNOSIS — R519 Headache, unspecified: Secondary | ICD-10-CM | POA: Diagnosis not present

## 2021-09-08 DIAGNOSIS — E1129 Type 2 diabetes mellitus with other diabetic kidney complication: Secondary | ICD-10-CM | POA: Diagnosis not present

## 2021-09-08 DIAGNOSIS — Z992 Dependence on renal dialysis: Secondary | ICD-10-CM | POA: Diagnosis not present

## 2021-09-08 DIAGNOSIS — N2581 Secondary hyperparathyroidism of renal origin: Secondary | ICD-10-CM | POA: Diagnosis not present

## 2021-09-08 DIAGNOSIS — E876 Hypokalemia: Secondary | ICD-10-CM | POA: Diagnosis not present

## 2021-09-08 DIAGNOSIS — Z23 Encounter for immunization: Secondary | ICD-10-CM | POA: Diagnosis not present

## 2021-09-11 DIAGNOSIS — N186 End stage renal disease: Secondary | ICD-10-CM | POA: Diagnosis not present

## 2021-09-11 DIAGNOSIS — E876 Hypokalemia: Secondary | ICD-10-CM | POA: Diagnosis not present

## 2021-09-11 DIAGNOSIS — Z23 Encounter for immunization: Secondary | ICD-10-CM | POA: Diagnosis not present

## 2021-09-11 DIAGNOSIS — E1129 Type 2 diabetes mellitus with other diabetic kidney complication: Secondary | ICD-10-CM | POA: Diagnosis not present

## 2021-09-11 DIAGNOSIS — Z992 Dependence on renal dialysis: Secondary | ICD-10-CM | POA: Diagnosis not present

## 2021-09-11 DIAGNOSIS — N2581 Secondary hyperparathyroidism of renal origin: Secondary | ICD-10-CM | POA: Diagnosis not present

## 2021-09-11 DIAGNOSIS — R519 Headache, unspecified: Secondary | ICD-10-CM | POA: Diagnosis not present

## 2021-09-11 DIAGNOSIS — D689 Coagulation defect, unspecified: Secondary | ICD-10-CM | POA: Diagnosis not present

## 2021-09-12 ENCOUNTER — Other Ambulatory Visit: Payer: Self-pay

## 2021-09-12 ENCOUNTER — Ambulatory Visit (INDEPENDENT_AMBULATORY_CARE_PROVIDER_SITE_OTHER): Payer: Medicare Other | Admitting: *Deleted

## 2021-09-12 DIAGNOSIS — L84 Corns and callosities: Secondary | ICD-10-CM

## 2021-09-12 DIAGNOSIS — E1159 Type 2 diabetes mellitus with other circulatory complications: Secondary | ICD-10-CM

## 2021-09-12 NOTE — Progress Notes (Signed)
Patient presents to the office today for diabetic shoe and insole measuring.  Patient was measured with brannock device to determine size and width for 1 pair of extra depth shoes and foam casted for 3 pair of insoles.   Documentation of medical necessity will be sent to patient's treating diabetic doctor to verify and sign.   Patient's diabetic provider: [name], NP (Dr. Glendale Chard)  Shoes and insoles will be ordered at that time and patient will be notified for an appointment for fitting when they arrive.   Shoe size (per patient): 8 Wide   Brannock measurement: RIGHT/LEFT - 8.5 D  Patient shoe selection-   1st choice:   Apex V952W  2nd choice:  Orthofeet 898  Shoe size ordered: Women's 8.5 Wide

## 2021-09-13 DIAGNOSIS — D689 Coagulation defect, unspecified: Secondary | ICD-10-CM | POA: Diagnosis not present

## 2021-09-13 DIAGNOSIS — N2581 Secondary hyperparathyroidism of renal origin: Secondary | ICD-10-CM | POA: Diagnosis not present

## 2021-09-13 DIAGNOSIS — Z23 Encounter for immunization: Secondary | ICD-10-CM | POA: Diagnosis not present

## 2021-09-13 DIAGNOSIS — Z992 Dependence on renal dialysis: Secondary | ICD-10-CM | POA: Diagnosis not present

## 2021-09-13 DIAGNOSIS — E1129 Type 2 diabetes mellitus with other diabetic kidney complication: Secondary | ICD-10-CM | POA: Diagnosis not present

## 2021-09-13 DIAGNOSIS — R519 Headache, unspecified: Secondary | ICD-10-CM | POA: Diagnosis not present

## 2021-09-13 DIAGNOSIS — E876 Hypokalemia: Secondary | ICD-10-CM | POA: Diagnosis not present

## 2021-09-13 DIAGNOSIS — N186 End stage renal disease: Secondary | ICD-10-CM | POA: Diagnosis not present

## 2021-09-15 DIAGNOSIS — E1129 Type 2 diabetes mellitus with other diabetic kidney complication: Secondary | ICD-10-CM | POA: Diagnosis not present

## 2021-09-15 DIAGNOSIS — E876 Hypokalemia: Secondary | ICD-10-CM | POA: Diagnosis not present

## 2021-09-15 DIAGNOSIS — N186 End stage renal disease: Secondary | ICD-10-CM | POA: Diagnosis not present

## 2021-09-15 DIAGNOSIS — Z23 Encounter for immunization: Secondary | ICD-10-CM | POA: Diagnosis not present

## 2021-09-15 DIAGNOSIS — Z992 Dependence on renal dialysis: Secondary | ICD-10-CM | POA: Diagnosis not present

## 2021-09-15 DIAGNOSIS — D689 Coagulation defect, unspecified: Secondary | ICD-10-CM | POA: Diagnosis not present

## 2021-09-15 DIAGNOSIS — N2581 Secondary hyperparathyroidism of renal origin: Secondary | ICD-10-CM | POA: Diagnosis not present

## 2021-09-15 DIAGNOSIS — R519 Headache, unspecified: Secondary | ICD-10-CM | POA: Diagnosis not present

## 2021-09-18 DIAGNOSIS — Z23 Encounter for immunization: Secondary | ICD-10-CM | POA: Diagnosis not present

## 2021-09-18 DIAGNOSIS — Z992 Dependence on renal dialysis: Secondary | ICD-10-CM | POA: Diagnosis not present

## 2021-09-18 DIAGNOSIS — R519 Headache, unspecified: Secondary | ICD-10-CM | POA: Diagnosis not present

## 2021-09-18 DIAGNOSIS — D689 Coagulation defect, unspecified: Secondary | ICD-10-CM | POA: Diagnosis not present

## 2021-09-18 DIAGNOSIS — N2581 Secondary hyperparathyroidism of renal origin: Secondary | ICD-10-CM | POA: Diagnosis not present

## 2021-09-18 DIAGNOSIS — E1129 Type 2 diabetes mellitus with other diabetic kidney complication: Secondary | ICD-10-CM | POA: Diagnosis not present

## 2021-09-18 DIAGNOSIS — N186 End stage renal disease: Secondary | ICD-10-CM | POA: Diagnosis not present

## 2021-09-18 DIAGNOSIS — E876 Hypokalemia: Secondary | ICD-10-CM | POA: Diagnosis not present

## 2021-09-20 DIAGNOSIS — Z23 Encounter for immunization: Secondary | ICD-10-CM | POA: Diagnosis not present

## 2021-09-20 DIAGNOSIS — E1129 Type 2 diabetes mellitus with other diabetic kidney complication: Secondary | ICD-10-CM | POA: Diagnosis not present

## 2021-09-20 DIAGNOSIS — Z992 Dependence on renal dialysis: Secondary | ICD-10-CM | POA: Diagnosis not present

## 2021-09-20 DIAGNOSIS — E876 Hypokalemia: Secondary | ICD-10-CM | POA: Diagnosis not present

## 2021-09-20 DIAGNOSIS — N2581 Secondary hyperparathyroidism of renal origin: Secondary | ICD-10-CM | POA: Diagnosis not present

## 2021-09-20 DIAGNOSIS — R519 Headache, unspecified: Secondary | ICD-10-CM | POA: Diagnosis not present

## 2021-09-20 DIAGNOSIS — N186 End stage renal disease: Secondary | ICD-10-CM | POA: Diagnosis not present

## 2021-09-20 DIAGNOSIS — D689 Coagulation defect, unspecified: Secondary | ICD-10-CM | POA: Diagnosis not present

## 2021-09-22 DIAGNOSIS — E876 Hypokalemia: Secondary | ICD-10-CM | POA: Diagnosis not present

## 2021-09-22 DIAGNOSIS — Z23 Encounter for immunization: Secondary | ICD-10-CM | POA: Diagnosis not present

## 2021-09-22 DIAGNOSIS — Z992 Dependence on renal dialysis: Secondary | ICD-10-CM | POA: Diagnosis not present

## 2021-09-22 DIAGNOSIS — D689 Coagulation defect, unspecified: Secondary | ICD-10-CM | POA: Diagnosis not present

## 2021-09-22 DIAGNOSIS — N186 End stage renal disease: Secondary | ICD-10-CM | POA: Diagnosis not present

## 2021-09-22 DIAGNOSIS — R519 Headache, unspecified: Secondary | ICD-10-CM | POA: Diagnosis not present

## 2021-09-22 DIAGNOSIS — N2581 Secondary hyperparathyroidism of renal origin: Secondary | ICD-10-CM | POA: Diagnosis not present

## 2021-09-22 DIAGNOSIS — E1129 Type 2 diabetes mellitus with other diabetic kidney complication: Secondary | ICD-10-CM | POA: Diagnosis not present

## 2021-09-25 DIAGNOSIS — Z992 Dependence on renal dialysis: Secondary | ICD-10-CM | POA: Diagnosis not present

## 2021-09-25 DIAGNOSIS — D689 Coagulation defect, unspecified: Secondary | ICD-10-CM | POA: Diagnosis not present

## 2021-09-25 DIAGNOSIS — N186 End stage renal disease: Secondary | ICD-10-CM | POA: Diagnosis not present

## 2021-09-25 DIAGNOSIS — Z23 Encounter for immunization: Secondary | ICD-10-CM | POA: Diagnosis not present

## 2021-09-25 DIAGNOSIS — N2581 Secondary hyperparathyroidism of renal origin: Secondary | ICD-10-CM | POA: Diagnosis not present

## 2021-09-25 DIAGNOSIS — E1129 Type 2 diabetes mellitus with other diabetic kidney complication: Secondary | ICD-10-CM | POA: Diagnosis not present

## 2021-09-25 DIAGNOSIS — E876 Hypokalemia: Secondary | ICD-10-CM | POA: Diagnosis not present

## 2021-09-25 DIAGNOSIS — R519 Headache, unspecified: Secondary | ICD-10-CM | POA: Diagnosis not present

## 2021-09-27 ENCOUNTER — Ambulatory Visit: Payer: Medicare Other | Admitting: Nurse Practitioner

## 2021-09-27 DIAGNOSIS — Z23 Encounter for immunization: Secondary | ICD-10-CM | POA: Diagnosis not present

## 2021-09-27 DIAGNOSIS — E1129 Type 2 diabetes mellitus with other diabetic kidney complication: Secondary | ICD-10-CM | POA: Diagnosis not present

## 2021-09-27 DIAGNOSIS — E876 Hypokalemia: Secondary | ICD-10-CM | POA: Diagnosis not present

## 2021-09-27 DIAGNOSIS — D689 Coagulation defect, unspecified: Secondary | ICD-10-CM | POA: Diagnosis not present

## 2021-09-27 DIAGNOSIS — N186 End stage renal disease: Secondary | ICD-10-CM | POA: Diagnosis not present

## 2021-09-27 DIAGNOSIS — N2581 Secondary hyperparathyroidism of renal origin: Secondary | ICD-10-CM | POA: Diagnosis not present

## 2021-09-27 DIAGNOSIS — Z992 Dependence on renal dialysis: Secondary | ICD-10-CM | POA: Diagnosis not present

## 2021-09-27 DIAGNOSIS — R519 Headache, unspecified: Secondary | ICD-10-CM | POA: Diagnosis not present

## 2021-09-29 DIAGNOSIS — R519 Headache, unspecified: Secondary | ICD-10-CM | POA: Diagnosis not present

## 2021-09-29 DIAGNOSIS — N186 End stage renal disease: Secondary | ICD-10-CM | POA: Diagnosis not present

## 2021-09-29 DIAGNOSIS — Z992 Dependence on renal dialysis: Secondary | ICD-10-CM | POA: Diagnosis not present

## 2021-09-29 DIAGNOSIS — E876 Hypokalemia: Secondary | ICD-10-CM | POA: Diagnosis not present

## 2021-09-29 DIAGNOSIS — E1129 Type 2 diabetes mellitus with other diabetic kidney complication: Secondary | ICD-10-CM | POA: Diagnosis not present

## 2021-09-29 DIAGNOSIS — N2581 Secondary hyperparathyroidism of renal origin: Secondary | ICD-10-CM | POA: Diagnosis not present

## 2021-09-29 DIAGNOSIS — D689 Coagulation defect, unspecified: Secondary | ICD-10-CM | POA: Diagnosis not present

## 2021-09-29 DIAGNOSIS — Z23 Encounter for immunization: Secondary | ICD-10-CM | POA: Diagnosis not present

## 2021-10-01 DIAGNOSIS — E1129 Type 2 diabetes mellitus with other diabetic kidney complication: Secondary | ICD-10-CM | POA: Diagnosis not present

## 2021-10-01 DIAGNOSIS — N186 End stage renal disease: Secondary | ICD-10-CM | POA: Diagnosis not present

## 2021-10-01 DIAGNOSIS — Z992 Dependence on renal dialysis: Secondary | ICD-10-CM | POA: Diagnosis not present

## 2021-10-02 DIAGNOSIS — N186 End stage renal disease: Secondary | ICD-10-CM | POA: Diagnosis not present

## 2021-10-02 DIAGNOSIS — Z992 Dependence on renal dialysis: Secondary | ICD-10-CM | POA: Diagnosis not present

## 2021-10-02 DIAGNOSIS — R519 Headache, unspecified: Secondary | ICD-10-CM | POA: Diagnosis not present

## 2021-10-02 DIAGNOSIS — D689 Coagulation defect, unspecified: Secondary | ICD-10-CM | POA: Diagnosis not present

## 2021-10-02 DIAGNOSIS — N2581 Secondary hyperparathyroidism of renal origin: Secondary | ICD-10-CM | POA: Diagnosis not present

## 2021-10-02 DIAGNOSIS — E876 Hypokalemia: Secondary | ICD-10-CM | POA: Diagnosis not present

## 2021-10-03 ENCOUNTER — Ambulatory Visit: Payer: Self-pay

## 2021-10-03 ENCOUNTER — Telehealth: Payer: Medicare Other

## 2021-10-03 DIAGNOSIS — I132 Hypertensive heart and chronic kidney disease with heart failure and with stage 5 chronic kidney disease, or end stage renal disease: Secondary | ICD-10-CM

## 2021-10-03 DIAGNOSIS — E1129 Type 2 diabetes mellitus with other diabetic kidney complication: Secondary | ICD-10-CM

## 2021-10-03 NOTE — Chronic Care Management (AMB) (Signed)
  Care Management   Follow Up Note   10/03/2021 Name: Jacqueline [name] MRN: [id number] DOB: 02-Dec-1946   Referred by: [name], FNP Reason for referral : Chronic Care Management (RN CM Follow up call - 3rd attempt )   Third unsuccessful telephone outreach was attempted today. The patient was referred to the case management team for assistance with care management and care coordination. The patient's primary care provider has been notified of our unsuccessful attempts to make or maintain contact with the patient. The care management team is pleased to engage with this patient at any time in the future should he/she be interested in assistance from the care management team.   Follow Up Plan: We have been unable to make contact with the patient for follow up. The care management team is available to follow up with the patient after provider conversation with the patient regarding recommendation for care management engagement and subsequent re-referral to the care management team.   Barb Merino, RN, BSN, CCM Care Management Coordinator [place] [facility]  Direct Phone: [phone]

## 2021-10-04 DIAGNOSIS — D689 Coagulation defect, unspecified: Secondary | ICD-10-CM | POA: Diagnosis not present

## 2021-10-04 DIAGNOSIS — R519 Headache, unspecified: Secondary | ICD-10-CM | POA: Diagnosis not present

## 2021-10-04 DIAGNOSIS — N2581 Secondary hyperparathyroidism of renal origin: Secondary | ICD-10-CM | POA: Diagnosis not present

## 2021-10-04 DIAGNOSIS — E876 Hypokalemia: Secondary | ICD-10-CM | POA: Diagnosis not present

## 2021-10-04 DIAGNOSIS — N186 End stage renal disease: Secondary | ICD-10-CM | POA: Diagnosis not present

## 2021-10-04 DIAGNOSIS — Z992 Dependence on renal dialysis: Secondary | ICD-10-CM | POA: Diagnosis not present

## 2021-10-06 DIAGNOSIS — E876 Hypokalemia: Secondary | ICD-10-CM | POA: Diagnosis not present

## 2021-10-06 DIAGNOSIS — N186 End stage renal disease: Secondary | ICD-10-CM | POA: Diagnosis not present

## 2021-10-06 DIAGNOSIS — R519 Headache, unspecified: Secondary | ICD-10-CM | POA: Diagnosis not present

## 2021-10-06 DIAGNOSIS — Z992 Dependence on renal dialysis: Secondary | ICD-10-CM | POA: Diagnosis not present

## 2021-10-06 DIAGNOSIS — D689 Coagulation defect, unspecified: Secondary | ICD-10-CM | POA: Diagnosis not present

## 2021-10-06 DIAGNOSIS — N2581 Secondary hyperparathyroidism of renal origin: Secondary | ICD-10-CM | POA: Diagnosis not present

## 2021-10-09 DIAGNOSIS — N186 End stage renal disease: Secondary | ICD-10-CM | POA: Diagnosis not present

## 2021-10-09 DIAGNOSIS — D689 Coagulation defect, unspecified: Secondary | ICD-10-CM | POA: Diagnosis not present

## 2021-10-09 DIAGNOSIS — E876 Hypokalemia: Secondary | ICD-10-CM | POA: Diagnosis not present

## 2021-10-09 DIAGNOSIS — R519 Headache, unspecified: Secondary | ICD-10-CM | POA: Diagnosis not present

## 2021-10-09 DIAGNOSIS — Z992 Dependence on renal dialysis: Secondary | ICD-10-CM | POA: Diagnosis not present

## 2021-10-09 DIAGNOSIS — N2581 Secondary hyperparathyroidism of renal origin: Secondary | ICD-10-CM | POA: Diagnosis not present

## 2021-10-10 ENCOUNTER — Encounter: Payer: Self-pay | Admitting: Nurse Practitioner

## 2021-10-10 ENCOUNTER — Ambulatory Visit (INDEPENDENT_AMBULATORY_CARE_PROVIDER_SITE_OTHER): Payer: Medicare Other | Admitting: Nurse Practitioner

## 2021-10-10 ENCOUNTER — Other Ambulatory Visit: Payer: Self-pay

## 2021-10-10 VITALS — BP 118/76 | HR 97 | Temp 98.4°F | Ht 64.4 in | Wt 248.0 lb

## 2021-10-10 DIAGNOSIS — R809 Proteinuria, unspecified: Secondary | ICD-10-CM

## 2021-10-10 DIAGNOSIS — I132 Hypertensive heart and chronic kidney disease with heart failure and with stage 5 chronic kidney disease, or end stage renal disease: Secondary | ICD-10-CM | POA: Diagnosis not present

## 2021-10-10 DIAGNOSIS — I130 Hypertensive heart and chronic kidney disease with heart failure and stage 1 through stage 4 chronic kidney disease, or unspecified chronic kidney disease: Secondary | ICD-10-CM

## 2021-10-10 DIAGNOSIS — M79605 Pain in left leg: Secondary | ICD-10-CM

## 2021-10-10 DIAGNOSIS — I1311 Hypertensive heart and chronic kidney disease without heart failure, with stage 5 chronic kidney disease, or end stage renal disease: Secondary | ICD-10-CM

## 2021-10-10 DIAGNOSIS — E1129 Type 2 diabetes mellitus with other diabetic kidney complication: Secondary | ICD-10-CM

## 2021-10-10 DIAGNOSIS — E1122 Type 2 diabetes mellitus with diabetic chronic kidney disease: Secondary | ICD-10-CM | POA: Diagnosis not present

## 2021-10-10 DIAGNOSIS — Z992 Dependence on renal dialysis: Secondary | ICD-10-CM | POA: Diagnosis not present

## 2021-10-10 DIAGNOSIS — E662 Morbid (severe) obesity with alveolar hypoventilation: Secondary | ICD-10-CM

## 2021-10-10 DIAGNOSIS — N186 End stage renal disease: Secondary | ICD-10-CM | POA: Diagnosis not present

## 2021-10-10 DIAGNOSIS — E782 Mixed hyperlipidemia: Secondary | ICD-10-CM | POA: Diagnosis not present

## 2021-10-10 DIAGNOSIS — Z6841 Body Mass Index (BMI) 40.0 and over, adult: Secondary | ICD-10-CM

## 2021-10-10 MED ORDER — KETOROLAC TROMETHAMINE 30 MG/ML IJ SOLN
30.0000 mg | Freq: Once | INTRAMUSCULAR | Status: AC
  Administered 2021-10-10: 30 mg via INTRAMUSCULAR

## 2021-10-10 NOTE — Patient Instructions (Signed)

## 2021-10-10 NOTE — Progress Notes (Addendum)
I,Victoria T Hamilton,acting as a Education administrator for [name], FNP.,have documented all relevant documentation on the behalf of [name], FNP,as directed by  [name], FNP while in the presence of [name], [place].  This visit occurred during the SARS-CoV-2 public health emergency.  Safety protocols were in place, including screening questions prior to the visit, additional usage of staff PPE, and extensive cleaning of exam room while observing appropriate contact time as indicated for disinfecting solutions.  Subjective:     Patient ID: Jacqueline [name] , female    DOB: 10/07/1946 , 75 y.o.   MRN: [id number]   Chief Complaint  Patient presents with   Diabetes   Hypertension     HPI  Here for blood pressure and diabetes f/u. She also has a form for therapeutic shoes that needs to be filled out.   Wt Readings from Last 3 Encounters: 10/10/21 : 248 lb (112.5 kg) 06/27/21 : 248 lb 0.3 oz (112.5 kg) 06/27/21 : 248 lb (112.5 kg)    Diabetes She presents for her follow-up diabetic visit. She has type 2 diabetes mellitus. There are no hypoglycemic associated symptoms. There are no diabetic associated symptoms. Pertinent negatives for diabetes include no polydipsia, no polyphagia and no polyuria. There are no hypoglycemic complications. There are no diabetic complications.  Hypertension This is a chronic problem. The current episode started more than 1 year ago. The problem is unchanged. The problem is controlled. Pertinent negatives include no anxiety. There are no associated agents to hypertension. Risk factors for coronary artery disease include obesity. Past treatments include diuretics. Compliance problems include exercise.  There is no history of angina. There is no history of chronic renal disease.    Past Medical History:  Diagnosis Date   Arthritis    "left knee" (12/04/2016)   CAD (coronary artery disease)    a. 12/2016 NSTEMI/PCI: LM nl, LaD 20p/d, D1 20, OM2 20, OM3 20, RCA  68m(3.0x18 Resolute DES). EF 65%.   Chronic lower back pain    Diverticulosis    on CT   ESRD [facility] (12/04/2016)   GERD (gastroesophageal reflux disease)    Hiatal hernia    Hyperlipidemia    Hypertension    a. 12/2016 labetalol d/c'd 2/2 hypotension in HD.   Morbid [place] ([place]    Obesity    Pneumonia    "couple times" (12/04/2016)   Renal insufficiency    Type II diabetes mellitus ([place]      Family History  Problem Relation Age of Onset   Diabetes Mother    Peripheral vascular disease Mother        amputation   CAD Father    Heart disease Father    Hypertension Father    Heart attack Father    CAD Sister    Cancer Brother        x 2 brothers, type unknown   Breast cancer Sister      Current Outpatient Medications:    aspirin EC 81 MG EC tablet, Take 1 tablet (81 mg total) by mouth daily., Disp: , Rfl:    AURYXIA 1 GM 210 MG(Fe) tablet, Take 420 mg by mouth 2 (two) times a day., Disp: , Rfl:    B Complex-C-Folic Acid (DIALYVITE 8280 0.8 MG TABS, Take 1 tablet by mouth daily., Disp: , Rfl:    cinacalcet (SENSIPAR) 30 MG tablet, Take 30 mg by mouth daily., Disp: , Rfl:  diclofenac sodium (VOLTAREN) 1 % GEL, Apply 2 g topically 4 (four) times daily. (Patient not taking: Reported on 12/07/2021), Disp: 100 g, Rfl: 2   Doxercalciferol (HECTOROL IV), Dialysis Tuesday,Thursday and saturday, Disp: , Rfl:    ezetimibe (ZETIA) 10 MG tablet, TAKE 1 TABLET BY MOUTH  DAILY  OVERDUE FOLLOW UP  APPOINTMENT NEEDED FOR  FURTHER REFILLS. (Patient taking differently: Take 10 mg by mouth daily.), Disp: 15 tablet, Rfl: 0   metoCLOPramide (REGLAN) 10 MG tablet, TAKE 1 TABLET BY MOUTH  TWICE DAILY 30 MINUTES  BEFORE MEALS (Patient taking differently: Take 10 mg by mouth daily before breakfast.), Disp: 120 tablet, Rfl: 0   midodrine (PROAMATINE) 10 MG tablet, Take 10 mg by mouth See admin instructions. Dialysis days Tues, Thurs Saturday, Disp: ,  Rfl:    Omega-3 Fatty Acids (FISH OIL) 1000 MG CPDR, Take 1,000 mg by mouth daily. , Disp: , Rfl:    omeprazole (PRILOSEC) 20 MG capsule, TAKE 1 CAPSULE BY MOUTH EVERY DAY BEFORE A MEAL (Patient taking differently: 20 mg daily.), Disp: 90 capsule, Rfl: 1   rosuvastatin (CRESTOR) 40 MG tablet, Take 1 tablet (40 mg total) by mouth daily. Please schedule appointment for future refills. 3rd & FINAL attempt!, Disp: 15 tablet, Rfl: 0   Vitamin D, Ergocalciferol, 50 MCG (2000 UT) CAPS, Take 2,000 Units by mouth daily., Disp: , Rfl:    acetaminophen (TYLENOL) 500 MG tablet, Take 1,000 mg by mouth every 6 (six) hours as needed for moderate pain or headache., Disp: , Rfl:    albuterol (VENTOLIN HFA) 108 (90 Base) MCG/ACT inhaler, Inhale 1-2 puffs into the lungs every 6 (six) hours as needed for wheezing or shortness of breath., Disp: 1 each, Rfl: 0   guaiFENesin (MUCINEX) 600 MG 12 hr tablet, Take 600 mg by mouth 2 (two) times daily as needed for cough or to loosen phlegm., Disp: , Rfl:    guaiFENesin (ROBITUSSIN) 100 MG/5ML liquid, Take 5-10 mLs (100-200 mg total) by mouth every 4 (four) hours as needed for cough or to loosen phlegm., Disp: 60 mL, Rfl: 0   guaiFENesin (ROBITUSSIN) 100 MG/5ML liquid, Take 5 mLs by mouth every 4 (four) hours as needed for cough or to loosen phlegm., Disp: , Rfl:    guaiFENesin (ROBITUSSIN) 100 MG/5ML liquid, Take 10 mLs by mouth every 4 (four) hours as needed for cough or to loosen phlegm., Disp: 60 mL, Rfl: 0   ibuprofen (ADVIL) 600 MG tablet, Take 1 tablet (600 mg total) by mouth every 6 (six) hours as needed., Disp: 30 tablet, Rfl: 0   ibuprofen (ADVIL) 600 MG tablet, Take 1 tablet (600 mg total) by mouth every 6 (six) hours as needed., Disp: 30 tablet, Rfl: 0   Allergies  Allergen Reactions   Other Other (See Comments)   Sulfa Antibiotics Other (See Comments)   Sulfa Drugs Cross Reactors Other (See Comments)    Doesn't remember      Review of Systems  Constitutional:  Negative.   Respiratory: Negative.    Cardiovascular: Negative.   Endocrine: Negative for polydipsia, polyphagia and polyuria.  Musculoskeletal:  Positive for arthralgias.  Neurological: Negative.   Psychiatric/Behavioral: Negative.      Today's Vitals   10/10/21 1013  BP: 118/76  Pulse: 97  Temp: 98.4 F (36.9 C)  Weight: 248 lb (112.5 kg)  Height: 5' 4.4" (1.636 m)  PainSc: 0-No pain   Body mass index is 42.04 kg/m.   Objective:  Physical Exam Vitals reviewed.  Constitutional:      General: She is not in acute distress.    Appearance: Normal appearance. She is obese.  HENT:     Head: Normocephalic.  Cardiovascular:     Rate and Rhythm: Normal rate and regular rhythm.     Pulses: Normal pulses.     Heart sounds: Normal heart sounds. No murmur heard. Pulmonary:     Effort: Pulmonary effort is normal. No respiratory distress.     Breath sounds: Normal breath sounds. No wheezing.  Musculoskeletal:        General: Tenderness (left lateral knee pain) present.  Skin:    General: Skin is warm and dry.     Capillary Refill: Capillary refill takes less than 2 seconds.  Neurological:     General: No focal deficit present.     Mental Status: She is alert and oriented to person, place, and time.     Cranial Nerves: No cranial nerve deficit.     Motor: No weakness.  Psychiatric:        Mood and Affect: Mood normal.        Behavior: Behavior normal.        Thought Content: Thought content normal.        Judgment: Judgment normal.        Assessment And Plan:     1. Type 2 diabetes mellitus with end-stage renal disease ([place]) Comments: Diabetic foot exam done, form completed, see screening Continue current medications - Hemoglobin A1c - CMP14+EGFR  2. 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 Mixed hyperlipidemia Comments: Stable, continue  current medications - Lipid panel  4. Class 3 obesity with alveolar hypoventilation and body mass index (BMI) of 40.0 to 44.9 in adult, unspecified whether serious comorbidity present [facility]) Comments: Encouraged healthy diet low in carbs/sugar  5. Pain in left leg Comments: Seen by Ortho and given injection without significant relief, will treat with low dose Toradol due to history of diabetes and would like to avoid steroids. - ketorolac (TORADOL) 30 MG/ML injection 30 mg  6. End stage renal disease ([place])  7. Dependence on hemodialysis [facility])     Patient was given opportunity to ask questions. Patient verbalized understanding of the plan and was able to repeat key elements of the plan. All questions were answered to their satisfaction.  [name], FNP   I, [name], FNP, have reviewed all documentation for this visit. The documentation on 10/10/21 for the exam, diagnosis, procedures, and orders are all accurate and complete.   IF YOU HAVE BEEN REFERRED TO A SPECIALIST, IT MAY TAKE 1-2 WEEKS TO SCHEDULE/PROCESS THE REFERRAL. IF YOU HAVE NOT HEARD FROM US/SPECIALIST IN TWO WEEKS, PLEASE GIVE [place] A CALL AT [phone] X [phone].   THE PATIENT IS ENCOURAGED TO PRACTICE SOCIAL DISTANCING DUE TO THE COVID-19 PANDEMIC.

## 2021-10-11 DIAGNOSIS — Z992 Dependence on renal dialysis: Secondary | ICD-10-CM | POA: Diagnosis not present

## 2021-10-11 DIAGNOSIS — N186 End stage renal disease: Secondary | ICD-10-CM | POA: Diagnosis not present

## 2021-10-11 DIAGNOSIS — D689 Coagulation defect, unspecified: Secondary | ICD-10-CM | POA: Diagnosis not present

## 2021-10-11 DIAGNOSIS — R519 Headache, unspecified: Secondary | ICD-10-CM | POA: Diagnosis not present

## 2021-10-11 DIAGNOSIS — N2581 Secondary hyperparathyroidism of renal origin: Secondary | ICD-10-CM | POA: Diagnosis not present

## 2021-10-11 DIAGNOSIS — E876 Hypokalemia: Secondary | ICD-10-CM | POA: Diagnosis not present

## 2021-10-11 LAB — CMP14+EGFR
ALT: 17 IU/L (ref 0–32)
AST: 22 IU/L (ref 0–40)
Albumin/Globulin Ratio: 1.5 (ref 1.2–2.2)
Albumin: 4.1 g/dL (ref 3.7–4.7)
Alkaline Phosphatase: 145 IU/L — ABNORMAL HIGH (ref 44–121)
BUN/Creatinine Ratio: 4 — ABNORMAL LOW (ref 12–28)
BUN: 29 mg/dL — ABNORMAL HIGH (ref 8–27)
Bilirubin Total: 0.4 mg/dL (ref 0.0–1.2)
CO2: 25 mmol/L (ref 20–29)
Calcium: 8.7 mg/dL (ref 8.7–10.3)
Chloride: 93 mmol/L — ABNORMAL LOW (ref 96–106)
Creatinine, Ser: 7.94 mg/dL — ABNORMAL HIGH (ref 0.57–1.00)
Globulin, Total: 2.7 g/dL (ref 1.5–4.5)
Glucose: 194 mg/dL — ABNORMAL HIGH (ref 70–99)
Potassium: 3.4 mmol/L — ABNORMAL LOW (ref 3.5–5.2)
Sodium: 142 mmol/L (ref 134–144)
Total Protein: 6.8 g/dL (ref 6.0–8.5)
eGFR: 5 mL/min/{1.73_m2} — ABNORMAL LOW (ref >59)

## 2021-10-11 LAB — LIPID PANEL
Chol/HDL Ratio: 3.1 ratio (ref 0.0–4.4)
Cholesterol, Total: 142 mg/dL (ref 100–199)
HDL: 46 mg/dL (ref >39)
LDL Chol Calc (NIH): 73 mg/dL (ref 0–99)
Triglycerides: 133 mg/dL (ref 0–149)
VLDL Cholesterol Cal: 23 mg/dL (ref 5–40)

## 2021-10-11 LAB — HEMOGLOBIN A1C
Est. average glucose Bld gHb Est-mCnc: 151 mg/dL
Hgb A1c MFr Bld: 6.9 % — ABNORMAL HIGH (ref 4.8–5.6)

## 2021-10-13 DIAGNOSIS — R519 Headache, unspecified: Secondary | ICD-10-CM | POA: Diagnosis not present

## 2021-10-13 DIAGNOSIS — E876 Hypokalemia: Secondary | ICD-10-CM | POA: Diagnosis not present

## 2021-10-13 DIAGNOSIS — N2581 Secondary hyperparathyroidism of renal origin: Secondary | ICD-10-CM | POA: Diagnosis not present

## 2021-10-13 DIAGNOSIS — D689 Coagulation defect, unspecified: Secondary | ICD-10-CM | POA: Diagnosis not present

## 2021-10-13 DIAGNOSIS — N186 End stage renal disease: Secondary | ICD-10-CM | POA: Diagnosis not present

## 2021-10-13 DIAGNOSIS — Z992 Dependence on renal dialysis: Secondary | ICD-10-CM | POA: Diagnosis not present

## 2021-10-16 DIAGNOSIS — N2581 Secondary hyperparathyroidism of renal origin: Secondary | ICD-10-CM | POA: Diagnosis not present

## 2021-10-16 DIAGNOSIS — D689 Coagulation defect, unspecified: Secondary | ICD-10-CM | POA: Diagnosis not present

## 2021-10-16 DIAGNOSIS — N186 End stage renal disease: Secondary | ICD-10-CM | POA: Diagnosis not present

## 2021-10-16 DIAGNOSIS — R519 Headache, unspecified: Secondary | ICD-10-CM | POA: Diagnosis not present

## 2021-10-16 DIAGNOSIS — Z992 Dependence on renal dialysis: Secondary | ICD-10-CM | POA: Diagnosis not present

## 2021-10-16 DIAGNOSIS — E876 Hypokalemia: Secondary | ICD-10-CM | POA: Diagnosis not present

## 2021-10-18 ENCOUNTER — Other Ambulatory Visit: Payer: Self-pay | Admitting: Nurse Practitioner

## 2021-10-18 DIAGNOSIS — R519 Headache, unspecified: Secondary | ICD-10-CM | POA: Diagnosis not present

## 2021-10-18 DIAGNOSIS — N186 End stage renal disease: Secondary | ICD-10-CM | POA: Diagnosis not present

## 2021-10-18 DIAGNOSIS — Z992 Dependence on renal dialysis: Secondary | ICD-10-CM | POA: Diagnosis not present

## 2021-10-18 DIAGNOSIS — D689 Coagulation defect, unspecified: Secondary | ICD-10-CM | POA: Diagnosis not present

## 2021-10-18 DIAGNOSIS — N2581 Secondary hyperparathyroidism of renal origin: Secondary | ICD-10-CM | POA: Diagnosis not present

## 2021-10-18 DIAGNOSIS — E876 Hypokalemia: Secondary | ICD-10-CM | POA: Diagnosis not present

## 2021-10-20 DIAGNOSIS — D689 Coagulation defect, unspecified: Secondary | ICD-10-CM | POA: Diagnosis not present

## 2021-10-20 DIAGNOSIS — E876 Hypokalemia: Secondary | ICD-10-CM | POA: Diagnosis not present

## 2021-10-20 DIAGNOSIS — N186 End stage renal disease: Secondary | ICD-10-CM | POA: Diagnosis not present

## 2021-10-20 DIAGNOSIS — N2581 Secondary hyperparathyroidism of renal origin: Secondary | ICD-10-CM | POA: Diagnosis not present

## 2021-10-20 DIAGNOSIS — R519 Headache, unspecified: Secondary | ICD-10-CM | POA: Diagnosis not present

## 2021-10-20 DIAGNOSIS — Z992 Dependence on renal dialysis: Secondary | ICD-10-CM | POA: Diagnosis not present

## 2021-10-23 DIAGNOSIS — D689 Coagulation defect, unspecified: Secondary | ICD-10-CM | POA: Diagnosis not present

## 2021-10-23 DIAGNOSIS — Z992 Dependence on renal dialysis: Secondary | ICD-10-CM | POA: Diagnosis not present

## 2021-10-23 DIAGNOSIS — N186 End stage renal disease: Secondary | ICD-10-CM | POA: Diagnosis not present

## 2021-10-23 DIAGNOSIS — R519 Headache, unspecified: Secondary | ICD-10-CM | POA: Diagnosis not present

## 2021-10-23 DIAGNOSIS — N2581 Secondary hyperparathyroidism of renal origin: Secondary | ICD-10-CM | POA: Diagnosis not present

## 2021-10-23 DIAGNOSIS — E876 Hypokalemia: Secondary | ICD-10-CM | POA: Diagnosis not present

## 2021-10-26 DIAGNOSIS — N2581 Secondary hyperparathyroidism of renal origin: Secondary | ICD-10-CM | POA: Diagnosis not present

## 2021-10-26 DIAGNOSIS — R519 Headache, unspecified: Secondary | ICD-10-CM | POA: Diagnosis not present

## 2021-10-26 DIAGNOSIS — D689 Coagulation defect, unspecified: Secondary | ICD-10-CM | POA: Diagnosis not present

## 2021-10-26 DIAGNOSIS — N186 End stage renal disease: Secondary | ICD-10-CM | POA: Diagnosis not present

## 2021-10-26 DIAGNOSIS — Z992 Dependence on renal dialysis: Secondary | ICD-10-CM | POA: Diagnosis not present

## 2021-10-26 DIAGNOSIS — E876 Hypokalemia: Secondary | ICD-10-CM | POA: Diagnosis not present

## 2021-10-28 DIAGNOSIS — N2581 Secondary hyperparathyroidism of renal origin: Secondary | ICD-10-CM | POA: Diagnosis not present

## 2021-10-28 DIAGNOSIS — E876 Hypokalemia: Secondary | ICD-10-CM | POA: Diagnosis not present

## 2021-10-28 DIAGNOSIS — D689 Coagulation defect, unspecified: Secondary | ICD-10-CM | POA: Diagnosis not present

## 2021-10-28 DIAGNOSIS — R519 Headache, unspecified: Secondary | ICD-10-CM | POA: Diagnosis not present

## 2021-10-28 DIAGNOSIS — N186 End stage renal disease: Secondary | ICD-10-CM | POA: Diagnosis not present

## 2021-10-28 DIAGNOSIS — Z992 Dependence on renal dialysis: Secondary | ICD-10-CM | POA: Diagnosis not present

## 2021-10-29 ENCOUNTER — Telehealth: Payer: Self-pay | Admitting: Podiatry

## 2021-10-29 NOTE — Telephone Encounter (Signed)
Pt called checking on her diabetic shoes.  It looks like we got the documents but the office note was not signed by the md. I have messaged the company to confirm and if needed I can refax it to them.

## 2021-10-30 DIAGNOSIS — R519 Headache, unspecified: Secondary | ICD-10-CM | POA: Diagnosis not present

## 2021-10-30 DIAGNOSIS — E876 Hypokalemia: Secondary | ICD-10-CM | POA: Diagnosis not present

## 2021-10-30 DIAGNOSIS — N186 End stage renal disease: Secondary | ICD-10-CM | POA: Diagnosis not present

## 2021-10-30 DIAGNOSIS — D689 Coagulation defect, unspecified: Secondary | ICD-10-CM | POA: Diagnosis not present

## 2021-10-30 DIAGNOSIS — Z992 Dependence on renal dialysis: Secondary | ICD-10-CM | POA: Diagnosis not present

## 2021-10-30 DIAGNOSIS — N2581 Secondary hyperparathyroidism of renal origin: Secondary | ICD-10-CM | POA: Diagnosis not present

## 2021-10-31 DIAGNOSIS — N186 End stage renal disease: Secondary | ICD-10-CM | POA: Diagnosis not present

## 2021-10-31 DIAGNOSIS — E1129 Type 2 diabetes mellitus with other diabetic kidney complication: Secondary | ICD-10-CM | POA: Diagnosis not present

## 2021-10-31 DIAGNOSIS — Z992 Dependence on renal dialysis: Secondary | ICD-10-CM | POA: Diagnosis not present

## 2021-11-01 DIAGNOSIS — L299 Pruritus, unspecified: Secondary | ICD-10-CM | POA: Diagnosis not present

## 2021-11-01 DIAGNOSIS — R519 Headache, unspecified: Secondary | ICD-10-CM | POA: Diagnosis not present

## 2021-11-01 DIAGNOSIS — D689 Coagulation defect, unspecified: Secondary | ICD-10-CM | POA: Diagnosis not present

## 2021-11-01 DIAGNOSIS — E1129 Type 2 diabetes mellitus with other diabetic kidney complication: Secondary | ICD-10-CM | POA: Diagnosis not present

## 2021-11-01 DIAGNOSIS — N2581 Secondary hyperparathyroidism of renal origin: Secondary | ICD-10-CM | POA: Diagnosis not present

## 2021-11-01 DIAGNOSIS — Z992 Dependence on renal dialysis: Secondary | ICD-10-CM | POA: Diagnosis not present

## 2021-11-01 DIAGNOSIS — E876 Hypokalemia: Secondary | ICD-10-CM | POA: Diagnosis not present

## 2021-11-01 DIAGNOSIS — N186 End stage renal disease: Secondary | ICD-10-CM | POA: Diagnosis not present

## 2021-11-03 DIAGNOSIS — E1129 Type 2 diabetes mellitus with other diabetic kidney complication: Secondary | ICD-10-CM | POA: Diagnosis not present

## 2021-11-03 DIAGNOSIS — E876 Hypokalemia: Secondary | ICD-10-CM | POA: Diagnosis not present

## 2021-11-03 DIAGNOSIS — D689 Coagulation defect, unspecified: Secondary | ICD-10-CM | POA: Diagnosis not present

## 2021-11-03 DIAGNOSIS — N2581 Secondary hyperparathyroidism of renal origin: Secondary | ICD-10-CM | POA: Diagnosis not present

## 2021-11-03 DIAGNOSIS — R519 Headache, unspecified: Secondary | ICD-10-CM | POA: Diagnosis not present

## 2021-11-03 DIAGNOSIS — L299 Pruritus, unspecified: Secondary | ICD-10-CM | POA: Diagnosis not present

## 2021-11-03 DIAGNOSIS — Z992 Dependence on renal dialysis: Secondary | ICD-10-CM | POA: Diagnosis not present

## 2021-11-03 DIAGNOSIS — N186 End stage renal disease: Secondary | ICD-10-CM | POA: Diagnosis not present

## 2021-11-06 DIAGNOSIS — L299 Pruritus, unspecified: Secondary | ICD-10-CM | POA: Diagnosis not present

## 2021-11-06 DIAGNOSIS — Z992 Dependence on renal dialysis: Secondary | ICD-10-CM | POA: Diagnosis not present

## 2021-11-06 DIAGNOSIS — E1129 Type 2 diabetes mellitus with other diabetic kidney complication: Secondary | ICD-10-CM | POA: Diagnosis not present

## 2021-11-06 DIAGNOSIS — E876 Hypokalemia: Secondary | ICD-10-CM | POA: Diagnosis not present

## 2021-11-06 DIAGNOSIS — D689 Coagulation defect, unspecified: Secondary | ICD-10-CM | POA: Diagnosis not present

## 2021-11-06 DIAGNOSIS — R519 Headache, unspecified: Secondary | ICD-10-CM | POA: Diagnosis not present

## 2021-11-06 DIAGNOSIS — N2581 Secondary hyperparathyroidism of renal origin: Secondary | ICD-10-CM | POA: Diagnosis not present

## 2021-11-06 DIAGNOSIS — N186 End stage renal disease: Secondary | ICD-10-CM | POA: Diagnosis not present

## 2021-11-08 DIAGNOSIS — Z992 Dependence on renal dialysis: Secondary | ICD-10-CM | POA: Diagnosis not present

## 2021-11-08 DIAGNOSIS — N186 End stage renal disease: Secondary | ICD-10-CM | POA: Diagnosis not present

## 2021-11-08 DIAGNOSIS — R519 Headache, unspecified: Secondary | ICD-10-CM | POA: Diagnosis not present

## 2021-11-08 DIAGNOSIS — N2581 Secondary hyperparathyroidism of renal origin: Secondary | ICD-10-CM | POA: Diagnosis not present

## 2021-11-08 DIAGNOSIS — L299 Pruritus, unspecified: Secondary | ICD-10-CM | POA: Diagnosis not present

## 2021-11-08 DIAGNOSIS — E876 Hypokalemia: Secondary | ICD-10-CM | POA: Diagnosis not present

## 2021-11-08 DIAGNOSIS — D689 Coagulation defect, unspecified: Secondary | ICD-10-CM | POA: Diagnosis not present

## 2021-11-08 DIAGNOSIS — E1129 Type 2 diabetes mellitus with other diabetic kidney complication: Secondary | ICD-10-CM | POA: Diagnosis not present

## 2021-11-10 DIAGNOSIS — N2581 Secondary hyperparathyroidism of renal origin: Secondary | ICD-10-CM | POA: Diagnosis not present

## 2021-11-10 DIAGNOSIS — E1129 Type 2 diabetes mellitus with other diabetic kidney complication: Secondary | ICD-10-CM | POA: Diagnosis not present

## 2021-11-10 DIAGNOSIS — D689 Coagulation defect, unspecified: Secondary | ICD-10-CM | POA: Diagnosis not present

## 2021-11-10 DIAGNOSIS — N186 End stage renal disease: Secondary | ICD-10-CM | POA: Diagnosis not present

## 2021-11-10 DIAGNOSIS — E876 Hypokalemia: Secondary | ICD-10-CM | POA: Diagnosis not present

## 2021-11-10 DIAGNOSIS — Z992 Dependence on renal dialysis: Secondary | ICD-10-CM | POA: Diagnosis not present

## 2021-11-10 DIAGNOSIS — L299 Pruritus, unspecified: Secondary | ICD-10-CM | POA: Diagnosis not present

## 2021-11-10 DIAGNOSIS — R519 Headache, unspecified: Secondary | ICD-10-CM | POA: Diagnosis not present

## 2021-11-13 DIAGNOSIS — N2581 Secondary hyperparathyroidism of renal origin: Secondary | ICD-10-CM | POA: Diagnosis not present

## 2021-11-13 DIAGNOSIS — E1129 Type 2 diabetes mellitus with other diabetic kidney complication: Secondary | ICD-10-CM | POA: Diagnosis not present

## 2021-11-13 DIAGNOSIS — L299 Pruritus, unspecified: Secondary | ICD-10-CM | POA: Diagnosis not present

## 2021-11-13 DIAGNOSIS — Z992 Dependence on renal dialysis: Secondary | ICD-10-CM | POA: Diagnosis not present

## 2021-11-13 DIAGNOSIS — E876 Hypokalemia: Secondary | ICD-10-CM | POA: Diagnosis not present

## 2021-11-13 DIAGNOSIS — N186 End stage renal disease: Secondary | ICD-10-CM | POA: Diagnosis not present

## 2021-11-13 DIAGNOSIS — D689 Coagulation defect, unspecified: Secondary | ICD-10-CM | POA: Diagnosis not present

## 2021-11-13 DIAGNOSIS — R519 Headache, unspecified: Secondary | ICD-10-CM | POA: Diagnosis not present

## 2021-11-15 DIAGNOSIS — E1129 Type 2 diabetes mellitus with other diabetic kidney complication: Secondary | ICD-10-CM | POA: Diagnosis not present

## 2021-11-15 DIAGNOSIS — E876 Hypokalemia: Secondary | ICD-10-CM | POA: Diagnosis not present

## 2021-11-15 DIAGNOSIS — N2581 Secondary hyperparathyroidism of renal origin: Secondary | ICD-10-CM | POA: Diagnosis not present

## 2021-11-15 DIAGNOSIS — N186 End stage renal disease: Secondary | ICD-10-CM | POA: Diagnosis not present

## 2021-11-15 DIAGNOSIS — R519 Headache, unspecified: Secondary | ICD-10-CM | POA: Diagnosis not present

## 2021-11-15 DIAGNOSIS — D689 Coagulation defect, unspecified: Secondary | ICD-10-CM | POA: Diagnosis not present

## 2021-11-15 DIAGNOSIS — L299 Pruritus, unspecified: Secondary | ICD-10-CM | POA: Diagnosis not present

## 2021-11-15 DIAGNOSIS — Z992 Dependence on renal dialysis: Secondary | ICD-10-CM | POA: Diagnosis not present

## 2021-11-17 DIAGNOSIS — L299 Pruritus, unspecified: Secondary | ICD-10-CM | POA: Diagnosis not present

## 2021-11-17 DIAGNOSIS — D689 Coagulation defect, unspecified: Secondary | ICD-10-CM | POA: Diagnosis not present

## 2021-11-17 DIAGNOSIS — E876 Hypokalemia: Secondary | ICD-10-CM | POA: Diagnosis not present

## 2021-11-17 DIAGNOSIS — E1129 Type 2 diabetes mellitus with other diabetic kidney complication: Secondary | ICD-10-CM | POA: Diagnosis not present

## 2021-11-17 DIAGNOSIS — N2581 Secondary hyperparathyroidism of renal origin: Secondary | ICD-10-CM | POA: Diagnosis not present

## 2021-11-17 DIAGNOSIS — R519 Headache, unspecified: Secondary | ICD-10-CM | POA: Diagnosis not present

## 2021-11-17 DIAGNOSIS — N186 End stage renal disease: Secondary | ICD-10-CM | POA: Diagnosis not present

## 2021-11-17 DIAGNOSIS — Z992 Dependence on renal dialysis: Secondary | ICD-10-CM | POA: Diagnosis not present

## 2021-11-20 ENCOUNTER — Other Ambulatory Visit: Payer: Self-pay | Admitting: Nurse Practitioner

## 2021-11-20 DIAGNOSIS — E876 Hypokalemia: Secondary | ICD-10-CM | POA: Diagnosis not present

## 2021-11-20 DIAGNOSIS — R519 Headache, unspecified: Secondary | ICD-10-CM | POA: Diagnosis not present

## 2021-11-20 DIAGNOSIS — D689 Coagulation defect, unspecified: Secondary | ICD-10-CM | POA: Diagnosis not present

## 2021-11-20 DIAGNOSIS — Z992 Dependence on renal dialysis: Secondary | ICD-10-CM | POA: Diagnosis not present

## 2021-11-20 DIAGNOSIS — E1129 Type 2 diabetes mellitus with other diabetic kidney complication: Secondary | ICD-10-CM | POA: Diagnosis not present

## 2021-11-20 DIAGNOSIS — L299 Pruritus, unspecified: Secondary | ICD-10-CM | POA: Diagnosis not present

## 2021-11-20 DIAGNOSIS — N2581 Secondary hyperparathyroidism of renal origin: Secondary | ICD-10-CM | POA: Diagnosis not present

## 2021-11-20 DIAGNOSIS — N186 End stage renal disease: Secondary | ICD-10-CM | POA: Diagnosis not present

## 2021-11-22 DIAGNOSIS — N2581 Secondary hyperparathyroidism of renal origin: Secondary | ICD-10-CM | POA: Diagnosis not present

## 2021-11-22 DIAGNOSIS — L299 Pruritus, unspecified: Secondary | ICD-10-CM | POA: Diagnosis not present

## 2021-11-22 DIAGNOSIS — E876 Hypokalemia: Secondary | ICD-10-CM | POA: Diagnosis not present

## 2021-11-22 DIAGNOSIS — R519 Headache, unspecified: Secondary | ICD-10-CM | POA: Diagnosis not present

## 2021-11-22 DIAGNOSIS — Z992 Dependence on renal dialysis: Secondary | ICD-10-CM | POA: Diagnosis not present

## 2021-11-22 DIAGNOSIS — D689 Coagulation defect, unspecified: Secondary | ICD-10-CM | POA: Diagnosis not present

## 2021-11-22 DIAGNOSIS — E1129 Type 2 diabetes mellitus with other diabetic kidney complication: Secondary | ICD-10-CM | POA: Diagnosis not present

## 2021-11-22 DIAGNOSIS — N186 End stage renal disease: Secondary | ICD-10-CM | POA: Diagnosis not present

## 2021-11-24 DIAGNOSIS — E876 Hypokalemia: Secondary | ICD-10-CM | POA: Diagnosis not present

## 2021-11-24 DIAGNOSIS — R519 Headache, unspecified: Secondary | ICD-10-CM | POA: Diagnosis not present

## 2021-11-24 DIAGNOSIS — L299 Pruritus, unspecified: Secondary | ICD-10-CM | POA: Diagnosis not present

## 2021-11-24 DIAGNOSIS — E1129 Type 2 diabetes mellitus with other diabetic kidney complication: Secondary | ICD-10-CM | POA: Diagnosis not present

## 2021-11-24 DIAGNOSIS — N2581 Secondary hyperparathyroidism of renal origin: Secondary | ICD-10-CM | POA: Diagnosis not present

## 2021-11-24 DIAGNOSIS — N186 End stage renal disease: Secondary | ICD-10-CM | POA: Diagnosis not present

## 2021-11-24 DIAGNOSIS — Z992 Dependence on renal dialysis: Secondary | ICD-10-CM | POA: Diagnosis not present

## 2021-11-24 DIAGNOSIS — D689 Coagulation defect, unspecified: Secondary | ICD-10-CM | POA: Diagnosis not present

## 2021-11-27 DIAGNOSIS — L299 Pruritus, unspecified: Secondary | ICD-10-CM | POA: Diagnosis not present

## 2021-11-27 DIAGNOSIS — E876 Hypokalemia: Secondary | ICD-10-CM | POA: Diagnosis not present

## 2021-11-27 DIAGNOSIS — D689 Coagulation defect, unspecified: Secondary | ICD-10-CM | POA: Diagnosis not present

## 2021-11-27 DIAGNOSIS — E1129 Type 2 diabetes mellitus with other diabetic kidney complication: Secondary | ICD-10-CM | POA: Diagnosis not present

## 2021-11-27 DIAGNOSIS — Z992 Dependence on renal dialysis: Secondary | ICD-10-CM | POA: Diagnosis not present

## 2021-11-27 DIAGNOSIS — N2581 Secondary hyperparathyroidism of renal origin: Secondary | ICD-10-CM | POA: Diagnosis not present

## 2021-11-27 DIAGNOSIS — R519 Headache, unspecified: Secondary | ICD-10-CM | POA: Diagnosis not present

## 2021-11-27 DIAGNOSIS — N186 End stage renal disease: Secondary | ICD-10-CM | POA: Diagnosis not present

## 2021-11-29 DIAGNOSIS — N2581 Secondary hyperparathyroidism of renal origin: Secondary | ICD-10-CM | POA: Diagnosis not present

## 2021-11-29 DIAGNOSIS — E876 Hypokalemia: Secondary | ICD-10-CM | POA: Diagnosis not present

## 2021-11-29 DIAGNOSIS — L299 Pruritus, unspecified: Secondary | ICD-10-CM | POA: Diagnosis not present

## 2021-11-29 DIAGNOSIS — D689 Coagulation defect, unspecified: Secondary | ICD-10-CM | POA: Diagnosis not present

## 2021-11-29 DIAGNOSIS — Z992 Dependence on renal dialysis: Secondary | ICD-10-CM | POA: Diagnosis not present

## 2021-11-29 DIAGNOSIS — E1129 Type 2 diabetes mellitus with other diabetic kidney complication: Secondary | ICD-10-CM | POA: Diagnosis not present

## 2021-11-29 DIAGNOSIS — R519 Headache, unspecified: Secondary | ICD-10-CM | POA: Diagnosis not present

## 2021-11-29 DIAGNOSIS — N186 End stage renal disease: Secondary | ICD-10-CM | POA: Diagnosis not present

## 2021-11-30 DIAGNOSIS — Z992 Dependence on renal dialysis: Secondary | ICD-10-CM | POA: Diagnosis not present

## 2021-11-30 DIAGNOSIS — I871 Compression of vein: Secondary | ICD-10-CM | POA: Diagnosis not present

## 2021-11-30 DIAGNOSIS — N186 End stage renal disease: Secondary | ICD-10-CM | POA: Diagnosis not present

## 2021-11-30 DIAGNOSIS — T82858A Stenosis of vascular prosthetic devices, implants and grafts, initial encounter: Secondary | ICD-10-CM | POA: Diagnosis not present

## 2021-12-01 DIAGNOSIS — N2581 Secondary hyperparathyroidism of renal origin: Secondary | ICD-10-CM | POA: Diagnosis not present

## 2021-12-01 DIAGNOSIS — N186 End stage renal disease: Secondary | ICD-10-CM | POA: Diagnosis not present

## 2021-12-01 DIAGNOSIS — D689 Coagulation defect, unspecified: Secondary | ICD-10-CM | POA: Diagnosis not present

## 2021-12-01 DIAGNOSIS — Z992 Dependence on renal dialysis: Secondary | ICD-10-CM | POA: Diagnosis not present

## 2021-12-01 DIAGNOSIS — E1129 Type 2 diabetes mellitus with other diabetic kidney complication: Secondary | ICD-10-CM | POA: Diagnosis not present

## 2021-12-01 DIAGNOSIS — L299 Pruritus, unspecified: Secondary | ICD-10-CM | POA: Diagnosis not present

## 2021-12-01 DIAGNOSIS — R519 Headache, unspecified: Secondary | ICD-10-CM | POA: Diagnosis not present

## 2021-12-01 DIAGNOSIS — E876 Hypokalemia: Secondary | ICD-10-CM | POA: Diagnosis not present

## 2021-12-06 ENCOUNTER — Emergency Department (HOSPITAL_COMMUNITY): Payer: Medicare Other

## 2021-12-06 ENCOUNTER — Emergency Department (HOSPITAL_COMMUNITY)
Admission: EM | Admit: 2021-12-06 | Discharge: 2021-12-07 | Disposition: A | Discharge status: Home / Self Care | Payer: Medicare Other | Attending: Emergency Medicine | Admitting: Emergency Medicine

## 2021-12-06 ENCOUNTER — Encounter (HOSPITAL_COMMUNITY): Payer: Self-pay | Admitting: Emergency Medicine

## 2021-12-06 DIAGNOSIS — Z992 Dependence on renal dialysis: Secondary | ICD-10-CM | POA: Insufficient documentation

## 2021-12-06 DIAGNOSIS — I120 Hypertensive chronic kidney disease with stage 5 chronic kidney disease or end stage renal disease: Secondary | ICD-10-CM | POA: Insufficient documentation

## 2021-12-06 DIAGNOSIS — R051 Acute cough: Secondary | ICD-10-CM

## 2021-12-06 DIAGNOSIS — Z7982 Long term (current) use of aspirin: Secondary | ICD-10-CM | POA: Diagnosis not present

## 2021-12-06 DIAGNOSIS — M25531 Pain in right wrist: Secondary | ICD-10-CM | POA: Insufficient documentation

## 2021-12-06 DIAGNOSIS — E1122 Type 2 diabetes mellitus with diabetic chronic kidney disease: Secondary | ICD-10-CM | POA: Diagnosis not present

## 2021-12-06 DIAGNOSIS — S8991XA Unspecified injury of right lower leg, initial encounter: Secondary | ICD-10-CM | POA: Diagnosis present

## 2021-12-06 DIAGNOSIS — S80211A Abrasion, right knee, initial encounter: Secondary | ICD-10-CM | POA: Diagnosis not present

## 2021-12-06 DIAGNOSIS — N186 End stage renal disease: Secondary | ICD-10-CM | POA: Diagnosis not present

## 2021-12-06 DIAGNOSIS — M25561 Pain in right knee: Secondary | ICD-10-CM | POA: Insufficient documentation

## 2021-12-06 DIAGNOSIS — M25511 Pain in right shoulder: Secondary | ICD-10-CM | POA: Diagnosis not present

## 2021-12-06 DIAGNOSIS — W1839XA Other fall on same level, initial encounter: Secondary | ICD-10-CM | POA: Diagnosis not present

## 2021-12-06 DIAGNOSIS — T148XXA Other injury of unspecified body region, initial encounter: Secondary | ICD-10-CM

## 2021-12-06 DIAGNOSIS — U071 COVID-19: Secondary | ICD-10-CM | POA: Diagnosis not present

## 2021-12-06 DIAGNOSIS — S80811A Abrasion, right lower leg, initial encounter: Secondary | ICD-10-CM | POA: Diagnosis not present

## 2021-12-06 DIAGNOSIS — R531 Weakness: Secondary | ICD-10-CM | POA: Diagnosis not present

## 2021-12-06 DIAGNOSIS — R059 Cough, unspecified: Secondary | ICD-10-CM | POA: Diagnosis not present

## 2021-12-06 DIAGNOSIS — W19XXXA Unspecified fall, initial encounter: Secondary | ICD-10-CM

## 2021-12-06 DIAGNOSIS — M7989 Other specified soft tissue disorders: Secondary | ICD-10-CM | POA: Diagnosis not present

## 2021-12-06 LAB — RESP PANEL BY RT-PCR (FLU A&B, COVID) ARPGX2
Influenza A by PCR: NEGATIVE
Influenza B by PCR: NEGATIVE
SARS Coronavirus 2 by RT PCR: POSITIVE — AB

## 2021-12-06 MED ORDER — IBUPROFEN 600 MG PO TABS: 600.0000 mg | ORAL_TABLET | Freq: Four times a day (QID) | ORAL | 0 refills | Status: DC | PRN

## 2021-12-06 MED ORDER — ACETAMINOPHEN 325 MG PO TABS
650.0000 mg | ORAL_TABLET | Freq: Once | ORAL | Status: AC | PRN
  Administered 2021-12-06: 650 mg via ORAL
  Filled 2021-12-06: qty 2

## 2021-12-06 MED ORDER — GUAIFENESIN 100 MG/5ML PO LIQD: 100.0000 mg | ORAL | 0 refills | Status: DC | PRN

## 2021-12-06 MED ORDER — ALBUTEROL SULFATE HFA 108 (90 BASE) MCG/ACT IN AERS
2.0000 | INHALATION_SPRAY | Freq: Once | RESPIRATORY_TRACT | Status: AC
  Administered 2021-12-06: 2 via RESPIRATORY_TRACT
  Filled 2021-12-06: qty 6.7

## 2021-12-06 MED ORDER — IBUPROFEN 800 MG PO TABS
800.0000 mg | ORAL_TABLET | Freq: Once | ORAL | Status: DC
  Filled 2021-12-06: qty 1

## 2021-12-06 MED ORDER — GUAIFENESIN 100 MG/5ML PO LIQD
5.0000 mL | Freq: Once | ORAL | Status: AC
  Administered 2021-12-06: 5 mL via ORAL
  Filled 2021-12-06: qty 5

## 2021-12-06 NOTE — ED Provider Notes (Signed)
[facility] EMERGENCY DEPARTMENT Provider Note   CSN: [id number] Arrival date & time: 12/06/21  3546     History  Chief Complaint  Patient presents with   Leg Pain    Jacqueline [name] is a 76 y.o. female.  Patient is a 76 yo female with including ESRD on dialysis, hyperlipdemia, HTN, DM, and obesity presenting for weakness. Patient states she has been feeling weak with a cough x 2 weeks. Patient states this generalized weakness caused her to fall at dialysis two weeks ago. States she fell on her right side injuring her right wrist and right knee. Denies head trauma or loc. No prior evaluation for fall.    The history is provided by the patient. No language interpreter was used.  Leg Pain Associated symptoms: no back pain and no fever       Home Medications Prior to Admission medications   Medication Sig Start Date End Date Taking? Authorizing Provider  aspirin EC 81 MG EC tablet Take 1 tablet (81 mg total) by mouth daily. 12/06/16   Geradine Girt, DO  AURYXIA 1 GM 210 MG(Fe) tablet Take 420 mg by mouth 2 (two) times a day. 06/08/19   [provider]  B Complex-C-Folic Acid (DIALYVITE 568) 0.8 MG TABS Take 1 tablet by mouth daily. 09/29/18   [provider]  B Complex-C-Zn-Folic Acid (DIALYVITE/ZINC) TABS Take 1 tablet by mouth daily. 06/21/21   [provider]  cinacalcet (SENSIPAR) 30 MG tablet Take 30 mg by mouth daily. 08/23/21   [provider]  diclofenac sodium (VOLTAREN) 1 % GEL Apply 2 g topically 4 (four) times daily. 03/22/19   [name], FNP  diclofenac Sodium (VOLTAREN) 1 % GEL SMARTSIG:2 Gram(s) Topical 4 Times Daily PRN 08/21/21   [provider]  Doxercalciferol (HECTOROL IV) Doxercalciferol (Hectorol) 08/14/21 08/13/22  [provider]  ezetimibe (ZETIA) 10 MG tablet TAKE 1 TABLET BY MOUTH  DAILY  OVERDUE FOLLOW UP  APPOINTMENT NEEDED FOR  FURTHER REFILLS. 04/13/21   Sueanne Margarita, MD   metoCLOPramide (REGLAN) 10 MG tablet TAKE 1 TABLET BY MOUTH  TWICE DAILY 30 MINUTES  BEFORE MEALS 05/29/21   [name], FNP  midodrine (PROAMATINE) 10 MG tablet Take 10 mg by mouth 3 (three) times a week. Dialysis days [place] Saturday    [provider]  Omega-3 Fatty Acids (FISH OIL) 1000 MG CPDR Take 1,000 mg by mouth daily.     [provider]  omeprazole (PRILOSEC) 20 MG capsule TAKE 1 CAPSULE BY MOUTH EVERY DAY BEFORE A MEAL 08/22/21   [name], FNP  rosuvastatin (CRESTOR) 40 MG tablet Take 1 tablet (40 mg total) by mouth daily. Please schedule appointment for future refills. 3rd & FINAL attempt! 05/07/21   Sueanne Margarita, MD  Vitamin D, Ergocalciferol, 50 MCG (2000 UT) CAPS Take 2,000 Units by mouth daily.    [provider]      Allergies    Other, Sulfa antibiotics, and Sulfa drugs cross reactors    Review of Systems   Review of Systems  Constitutional:  Negative for chills and fever.  HENT:  Negative for ear pain and sore throat.   Eyes:  Negative for pain and visual disturbance.  Respiratory:  Positive for cough. Negative for shortness of breath.   Cardiovascular:  Negative for chest pain and palpitations.  Gastrointestinal:  Negative for abdominal pain and vomiting.  Genitourinary:  Negative for dysuria and hematuria.  Musculoskeletal:  Negative for arthralgias and back pain.       Knee and wrist pain   Skin:  Negative for color change and rash.  Neurological:  Positive for weakness. Negative for seizures and syncope.  All other systems reviewed and are negative.  Physical Exam Updated Vital Signs BP (!) 158/79 (BP Location: Right Arm)    Pulse 79    Temp 98.6 F (37 C) (Oral)    Resp 19    SpO2 100%  Physical Exam Vitals and nursing note reviewed.  Constitutional:      General: She is not in acute distress.    Appearance: She is well-developed.  HENT:     Head: Normocephalic and atraumatic.  Eyes:     Conjunctiva/sclera:  Conjunctivae normal.  Cardiovascular:     Rate and Rhythm: Normal rate and regular rhythm.     Heart sounds: No murmur heard. Pulmonary:     Effort: Pulmonary effort is normal. No respiratory distress.     Breath sounds: Normal breath sounds.  Abdominal:     Palpations: Abdomen is soft.     Tenderness: There is no abdominal tenderness.  Musculoskeletal:        General: No swelling.     Right shoulder: Bony tenderness present.     Left shoulder: Normal.     Right upper arm: Normal.     Left upper arm: Normal.     Right elbow: Normal.     Left elbow: Normal.     Right forearm: Normal.     Left forearm: Normal.     Right wrist: Bony tenderness present. Normal pulse.     Left wrist: Normal.     Cervical back: Neck supple.     Right hip: Normal.     Left hip: Normal.     Right upper leg: Normal.     Left upper leg: Normal.     Right knee: Bony tenderness present.     Left knee: Normal.     Right lower leg: 3+ Pitting Edema present.     Left lower leg: 1+ Edema present.     Right ankle: Normal.     Left ankle: Normal.  Skin:    General: Skin is warm and dry.     Capillary Refill: Capillary refill takes less than 2 seconds.       Neurological:     Mental Status: She is alert.  Psychiatric:        Mood and Affect: Mood normal.    ED Results / Procedures / Treatments   Labs (all labs ordered are listed, but only abnormal results are displayed) Labs Reviewed  RESP PANEL BY RT-PCR (FLU A&B, COVID) ARPGX2 - Abnormal; Notable for the following components:      Result Value   SARS Coronavirus 2 by RT PCR POSITIVE (*)    All other components within normal limits    EKG None  Radiology DG Shoulder Right  Result Date: 12/06/2021 CLINICAL DATA:  [place] 2 weeks ago, continued pain to RIGHT shoulder, RIGHT knee, RIGHT wrist EXAM: RIGHT SHOULDER - 2+ VIEW COMPARISON:  None FINDINGS: Osseous demineralization. AC joint alignment normal. Visualized RIGHT ribs intact. No acute  fracture, dislocation, or bone destruction. IMPRESSION: No acute osseous abnormalities. Electronically Signed   By: Lavonia Dana M.D.   On: 12/06/2021 13:39   DG Wrist Complete Right  Result Date: 12/06/2021 CLINICAL DATA:  [place] 2 weeks ago, continued RIGHT knee, RIGHT shoulder and RIGHT wrist pain EXAM:  RIGHT WRIST - COMPLETE 3+ VIEW COMPARISON:  None FINDINGS: Osseous demineralization. Mild widening of scapholunate interval suggesting torn scapholunate ligament. Mild narrowing of the radiocarpal joint. No acute fracture, dislocation, or bone destruction. Extensive small vessel vascular calcifications. IMPRESSION: Probable torn scapholunate ligament. Osseous demineralization and mild degenerative changes without additional acute abnormalities. Electronically Signed   By: Lavonia Dana M.D.   On: 12/06/2021 13:38   DG Tibia/Fibula Right  Result Date: 12/06/2021 CLINICAL DATA:  [place] 2 weeks ago, continued pain RIGHT knee, RIGHT shoulder, RIGHT wrist EXAM: RIGHT TIBIA AND FIBULA - 2 VIEW COMPARISON:  None FINDINGS: Osseous demineralization. Diffuse knee joint space narrowing and spur formation. Ankle joint space preserved. Scattered soft tissue swelling especially lower leg/ankle. No acute fracture, dislocation, or bone destruction. Small plantar and Achilles insertion calcaneal spurs. Scattered atherosclerotic calcifications. No knee joint effusion. IMPRESSION: No acute osseous abnormalities. Osseous demineralization with degenerative changes RIGHT knee. Calcaneal spurring. Electronically Signed   By: Lavonia Dana M.D.   On: 12/06/2021 13:40    Procedures Procedures    Medications Ordered in ED Medications  guaiFENesin (ROBITUSSIN) 100 MG/5ML liquid 5 mL (has no administration in time range)  albuterol (VENTOLIN HFA) 108 (90 Base) MCG/ACT inhaler 2 puff (has no administration in time range)  ibuprofen (ADVIL) tablet 800 mg (has no administration in time range)  acetaminophen (TYLENOL) tablet 650 mg (650  mg Oral Given 12/06/21 1223)    ED Course/ Medical Decision Making/ A&P                           Medical Decision Making  10:08 PM 76 yo female with including ESRD on dialysis, hyperlipdemia, HTN, DM, and obesity presenting for weakness resulting in fall two weeks ago. Pt alert and oreinted x 3, no acute distress, afebrile, with stable vitals. Stable ECG with no ST elevation or depression.   Patient coughing on exam. Covid positive. No hypoxia or respiratory distress. Robitussin, motrin, and albuterol given for symptomatic management. Outside symptom onset window for starting Paxlovid.  Patient has bony pain from fall and superficial abrasion to right lower extremity. Neurovascularly intact. Xrays demonstrate no acute process.  Right leg swelling significantly larger than left with pitting edema. Minimal pain post fall. Wound is nontender without erythema, warmth, or drainage. Doubt cellulitis. Recommend Venous duplex to rule out DVT. [place] not in house at this time. Outpatient [place] ordered.  11:26 PM Patient signed out to oncoming physician while awaiting knee xray.        Final Clinical Impression(s) / ED Diagnoses Final diagnoses:  COVID  Generalized weakness  Acute cough  Fall, initial encounter  Acute pain of right knee  Right wrist pain  Skin abrasion    Rx / DC Orders ED Discharge Orders     None         Lianne Cure, DO 54/65/68 2330

## 2021-12-06 NOTE — ED Triage Notes (Incomplete)
Patient complains of right leg pain after a fall two weeks ago. Patient states she came to ED previously for same complaint but left without being seen due to wait time. Patient alert, oriented, and in no apparent distress at this time. Dialysis access in left arm, T,Th,Sat schedule, full treatment today. Patient also reports a persist cough the last few days and having an issue with hypotension, BP

## 2021-12-06 NOTE — ED Notes (Signed)
Received verbal from [place] at this time

## 2021-12-06 NOTE — ED Provider Triage Note (Signed)
Emergency Medicine Provider Triage Evaluation Note  Jacqueline [name] , a 76 y.o. female  was evaluated in triage.  Pt complains of right leg, right wrist, right shoulder pain after fall.  She states that 2 weeks ago she was at dialysis when she fell onto her right side.  She states that she initially tried to come here to be evaluated however wait was too long and so she went home.  She states that she has had ongoing pain and burning in the right lower extremity.  Additionally she states that she has had a cold for about a week that she has been unable to get rid of.  She states that she is having congestion and coughing.  She denies fevers.  Review of Systems  Positive: See above Negative:   Physical Exam  BP (!) 104/45 (BP Location: Right Arm)    Pulse (!) 50    Temp 98 F (36.7 C) (Temporal)    Resp 18    SpO2 100%  Gen:   Awake, no distress   Resp:  Normal effort  MSK:   Large abrasion to the right lower leg on the lateral side.  There is scabbing.  Surrounding redness.  The area is not particularly swollen or hot compared to the left leg.  Right wrist with good range of motion however decreased grip strength.  She also has pain to extension and flexion of the shoulder. Other:    Medical Decision Making  Medically screening exam initiated at 12:49 PM.  Appropriate orders placed.  [name] was informed that the remainder of the evaluation will be completed by another provider, this initial triage assessment does not replace that evaluation, and the importance of remaining in the ED until their evaluation is complete.     Mickie Hillier, PA-C 12/06/21 1254

## 2021-12-07 ENCOUNTER — Telehealth: Payer: Self-pay

## 2021-12-07 ENCOUNTER — Other Ambulatory Visit: Payer: Self-pay

## 2021-12-07 NOTE — Telephone Encounter (Signed)
Unable to reach pt. Called pt after visiting ED on 12/06/2021. Wanting to offer pt ED f/u apt & to see how she is doing.

## 2021-12-10 ENCOUNTER — Telehealth: Payer: Self-pay

## 2021-12-10 NOTE — Telephone Encounter (Signed)
Spoke with pt after visiting ED. She states she is doing pretty good. She reports she still has a cough. The ED had given pt ibuprofen she cannot take being she has kidney issues. She only takes Tylenol. She also reports a fall happening in a parking lot 3 weeks ago. Provider notified.

## 2021-12-12 ENCOUNTER — Ambulatory Visit: Payer: Medicare Other | Admitting: Podiatry

## 2021-12-14 ENCOUNTER — Emergency Department (HOSPITAL_COMMUNITY)
Admission: EM | Admit: 2021-12-14 | Discharge: 2021-12-15 | Disposition: A | Discharge status: Home / Self Care | Payer: Medicare Other | Attending: Student | Admitting: Student

## 2021-12-14 ENCOUNTER — Encounter (HOSPITAL_COMMUNITY): Payer: Self-pay

## 2021-12-14 ENCOUNTER — Emergency Department (HOSPITAL_COMMUNITY): Payer: Medicare Other

## 2021-12-14 ENCOUNTER — Other Ambulatory Visit: Payer: Self-pay

## 2021-12-14 DIAGNOSIS — I509 Heart failure, unspecified: Secondary | ICD-10-CM | POA: Diagnosis not present

## 2021-12-14 DIAGNOSIS — E1122 Type 2 diabetes mellitus with diabetic chronic kidney disease: Secondary | ICD-10-CM | POA: Diagnosis not present

## 2021-12-14 DIAGNOSIS — Z87891 Personal history of nicotine dependence: Secondary | ICD-10-CM | POA: Insufficient documentation

## 2021-12-14 DIAGNOSIS — N186 End stage renal disease: Secondary | ICD-10-CM | POA: Insufficient documentation

## 2021-12-14 DIAGNOSIS — R001 Bradycardia, unspecified: Secondary | ICD-10-CM | POA: Diagnosis not present

## 2021-12-14 DIAGNOSIS — I2510 Atherosclerotic heart disease of native coronary artery without angina pectoris: Secondary | ICD-10-CM | POA: Insufficient documentation

## 2021-12-14 DIAGNOSIS — I499 Cardiac arrhythmia, unspecified: Secondary | ICD-10-CM | POA: Diagnosis not present

## 2021-12-14 DIAGNOSIS — R0602 Shortness of breath: Secondary | ICD-10-CM | POA: Diagnosis not present

## 2021-12-14 DIAGNOSIS — Z992 Dependence on renal dialysis: Secondary | ICD-10-CM | POA: Diagnosis not present

## 2021-12-14 DIAGNOSIS — R0689 Other abnormalities of breathing: Secondary | ICD-10-CM | POA: Diagnosis not present

## 2021-12-14 DIAGNOSIS — Z743 Need for continuous supervision: Secondary | ICD-10-CM | POA: Diagnosis not present

## 2021-12-14 DIAGNOSIS — R0600 Dyspnea, unspecified: Secondary | ICD-10-CM | POA: Diagnosis not present

## 2021-12-14 DIAGNOSIS — Z7982 Long term (current) use of aspirin: Secondary | ICD-10-CM | POA: Diagnosis not present

## 2021-12-14 DIAGNOSIS — I132 Hypertensive heart and chronic kidney disease with heart failure and with stage 5 chronic kidney disease, or end stage renal disease: Secondary | ICD-10-CM | POA: Diagnosis not present

## 2021-12-14 DIAGNOSIS — U071 COVID-19: Secondary | ICD-10-CM | POA: Diagnosis not present

## 2021-12-14 DIAGNOSIS — R6889 Other general symptoms and signs: Secondary | ICD-10-CM | POA: Diagnosis not present

## 2021-12-14 LAB — CBC WITH DIFFERENTIAL/PLATELET
Abs Immature Granulocytes: 0.03 10*3/uL (ref 0.00–0.07)
Basophils Absolute: 0.1 10*3/uL (ref 0.0–0.1)
Basophils Relative: 1 %
Eosinophils Absolute: 0 10*3/uL (ref 0.0–0.5)
Eosinophils Relative: 0 %
HCT: 35.4 % — ABNORMAL LOW (ref 36.0–46.0)
Hemoglobin: 11 g/dL — ABNORMAL LOW (ref 12.0–15.0)
Immature Granulocytes: 0 %
Lymphocytes Relative: 13 %
Lymphs Abs: 1.6 10*3/uL (ref 0.7–4.0)
MCH: 30.3 pg (ref 26.0–34.0)
MCHC: 31.1 g/dL (ref 30.0–36.0)
MCV: 97.5 fL (ref 80.0–100.0)
Monocytes Absolute: 1.3 10*3/uL — ABNORMAL HIGH (ref 0.1–1.0)
Monocytes Relative: 11 %
Neutro Abs: 9.1 10*3/uL — ABNORMAL HIGH (ref 1.7–7.7)
Neutrophils Relative %: 75 %
Platelets: 188 10*3/uL (ref 150–400)
RBC: 3.63 MIL/uL — ABNORMAL LOW (ref 3.87–5.11)
RDW: 16.8 % — ABNORMAL HIGH (ref 11.5–15.5)
WBC: 12.2 10*3/uL — ABNORMAL HIGH (ref 4.0–10.5)
nRBC: 0 % (ref 0.0–0.2)

## 2021-12-14 LAB — COMPREHENSIVE METABOLIC PANEL
ALT: 12 U/L (ref 0–44)
AST: 17 U/L (ref 15–41)
Albumin: 2.8 g/dL — ABNORMAL LOW (ref 3.5–5.0)
Alkaline Phosphatase: 74 U/L (ref 38–126)
Anion gap: 16 — ABNORMAL HIGH (ref 5–15)
BUN: 28 mg/dL — ABNORMAL HIGH (ref 8–23)
CO2: 27 mmol/L (ref 22–32)
Calcium: 8.8 mg/dL — ABNORMAL LOW (ref 8.9–10.3)
Chloride: 95 mmol/L — ABNORMAL LOW (ref 98–111)
Creatinine, Ser: 7.21 mg/dL — ABNORMAL HIGH (ref 0.44–1.00)
GFR, Estimated: 5 mL/min — ABNORMAL LOW (ref >60)
Glucose, Bld: 115 mg/dL — ABNORMAL HIGH (ref 70–99)
Potassium: 3.8 mmol/L (ref 3.5–5.1)
Sodium: 138 mmol/L (ref 135–145)
Total Bilirubin: 1.4 mg/dL — ABNORMAL HIGH (ref 0.3–1.2)
Total Protein: 6.9 g/dL (ref 6.5–8.1)

## 2021-12-14 LAB — BRAIN NATRIURETIC PEPTIDE: B Natriuretic Peptide: 932.4 pg/mL — ABNORMAL HIGH (ref 0.0–100.0)

## 2021-12-14 LAB — TROPONIN I (HIGH SENSITIVITY)
Troponin I (High Sensitivity): 47 ng/L — ABNORMAL HIGH (ref <18)
Troponin I (High Sensitivity): 50 ng/L — ABNORMAL HIGH (ref <18)

## 2021-12-14 NOTE — ED Triage Notes (Signed)
Pt BIB EMS after testing positive for COVID a week ago with SOB. Upon EMS arrival pt's Spo2 was in the low 80's, but was brought up to 94-95% on RA after sitting the pt up. Pt goes to dailysis on T, Th, S schedule, with her left side restricted.   VSS w/ EMS besides respirations in the 30's.

## 2021-12-14 NOTE — ED Notes (Signed)
RN aware of pts HR

## 2021-12-14 NOTE — ED Provider Notes (Signed)
[facility] EMERGENCY DEPARTMENT Provider Note  CSN: [id number] Arrival date & time: 12/14/21 1452  Chief Complaint(s) Shortness of Breath  HPI AMIAYA MCNEELEY is a 76 y.o. female with PMH ESRD on dialysis, CAD status post DES, T2DM, HTN, HLD who presents emergency department for evaluation of shortness of breath.  She tested positive for COVID-19 approximately 8 days ago and has had persistent dyspnea on exertion since.  She was found to be hypoxic to 88% on room air while lying flat by EMS.  She was placed on 3 L nasal cannula and arrives saturating 100%.  The nasal cannula was then removed and she has persistent maintenance of her sats at 100% on room air.  She currently denies chest pain, abdominal pain, nausea, vomiting or other systemic symptoms.   Shortness of Breath  Past Medical History Past Medical History:  Diagnosis Date   Arthritis    "left knee" (12/04/2016)   CAD (coronary artery disease)    a. 12/2016 NSTEMI/PCI: LM nl, LaD 20p/d, D1 20, OM2 20, OM3 20, RCA 82m (3.0x18 Resolute DES). EF 65%.   Chronic lower back pain    Diverticulosis    on CT   ESRD on dialysis [facility])    "[place]; [place]; TTS" (12/04/2016)   GERD (gastroesophageal reflux disease)    Hiatal hernia    Hyperlipidemia    Hypertension    a. 12/2016 labetalol d/c'd 2/2 hypotension in HD.   Morbid obesity ([place])    Obesity    Pneumonia    "couple times" (12/04/2016)   Renal insufficiency    Type II diabetes mellitus ([place])    Patient Active Problem List   Diagnosis Date Noted   Anaphylactic shock, unspecified, initial encounter 06/29/2020   Sepsis ([place]) 06/22/2019   Pain due to onychomycosis of toenails of both feet 06/11/2019   Callus 06/11/2019   Other disorders of phosphorus metabolism 05/20/2019   Mixed hyperlipidemia 03/22/2019   Otalgia of both ears 03/22/2019   Right arm pain 03/22/2019   Bradycardia 10/01/2017   Dyslipidemia 10/01/2017   Dependence on renal  dialysis ([place]) 09/01/2017   Chest pain, unspecified 12/06/2016   Non-ST elevation (NSTEMI) myocardial infarction ([place]) 12/06/2016   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   History of NSTEMI    CAD S/P percutaneous coronary angioplasty 12/03/2016   Hypokalemia 05/03/2016   Pruritus, unspecified 03/27/2015   Encounter for removal of sutures 11/30/2014   Pain in lower limb 08/26/2014   Encounter for immunization 06/10/2014   Unspecified protein-calorie malnutrition ([place]) 02/28/2014   Allergy, unspecified, initial encounter 02/15/2014   HCAP (healthcare-associated pneumonia) 02/13/2014   Absence of both cervix and uterus, acquired 02/01/2014   Coagulation defect, unspecified ([place]) 02/01/2014   Dyspnea, unspecified 02/01/2014   Family history of diabetes mellitus 02/01/2014   Family history of ischemic heart disease and other diseases of the circulatory system 02/01/2014   Fever, unspecified 02/01/2014   Headache, unspecified 02/01/2014   History of allergy to sulfonamides 02/01/2014   Iron deficiency anemia, unspecified 02/01/2014   Long term (current) use of insulin ([place]) 02/01/2014   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 Authorizing Provider  acetaminophen (TYLENOL) 500 MG tablet Take 1,000 mg by mouth every 6 (six) hours as needed for moderate pain or headache.    [provider]  aspirin EC 81 MG EC tablet Take 1 tablet (81 mg total) by mouth daily. 12/06/16   Geradine Girt, DO   AURYXIA 1 GM 210 MG(Fe) tablet Take 420 mg by mouth 2 (two) times a day. 06/08/19   [provider]  B Complex-C-Folic Acid (DIALYVITE 024) 0.8 MG TABS Take 1 tablet by mouth daily. 09/29/18   [provider]  cinacalcet (SENSIPAR) 30 MG tablet Take 30 mg by mouth daily. 08/23/21   [provider]  diclofenac sodium (VOLTAREN) 1 % GEL Apply 2 g topically 4 (four) times daily. Patient not taking: Reported on 12/07/2021 03/22/19   [name], FNP  Doxercalciferol ([place] IV) Dialysis Tuesday,Thursday and saturday 08/14/21 08/13/22  [provider]  ezetimibe (ZETIA) 10 MG tablet TAKE 1 TABLET BY MOUTH  DAILY  OVERDUE FOLLOW UP  APPOINTMENT NEEDED FOR  FURTHER REFILLS. Patient taking differently: Take 10 mg by mouth daily. 04/13/21   Sueanne Margarita, MD  guaiFENesin (MUCINEX) 600 MG 12 hr tablet Take 600 mg by mouth 2 (two) times daily as needed for cough or to loosen phlegm.    [provider]  guaiFENesin (ROBITUSSIN) 100 MG/5ML liquid Take 5-10 mLs (100-200 mg total) by mouth every 4 (four) hours as needed for cough or to loosen phlegm. 0/9/73   Campbell Stall P, DO  guaiFENesin (ROBITUSSIN) 100 MG/5ML liquid Take 5 mLs by mouth every 4 (four) hours as needed for cough or to loosen phlegm.    [provider]  ibuprofen (ADVIL) 600 MG tablet Take 1 tablet (600 mg total) by mouth every 6 (six) hours as needed. 04/03/28   Campbell Stall P, DO  metoCLOPramide (REGLAN) 10 MG tablet TAKE 1 TABLET BY MOUTH  TWICE DAILY 30 MINUTES  BEFORE MEALS Patient taking differently: Take 10 mg by mouth daily before breakfast. 05/29/21   [name], FNP  midodrine (PROAMATINE) 10 MG tablet Take 10 mg by mouth See admin instructions. Dialysis days [place] Saturday    [provider]  Omega-3 Fatty Acids (FISH OIL) 1000 MG CPDR Take 1,000 mg by mouth daily.     [provider]  omeprazole (PRILOSEC) 20 MG capsule TAKE 1 CAPSULE BY MOUTH EVERY DAY BEFORE  A MEAL Patient taking differently: 20 mg daily. 08/22/21   [name], FNP  rosuvastatin (CRESTOR) 40 MG tablet Take 1 tablet (40 mg total) by mouth daily. Please schedule appointment for future refills. 3rd & FINAL attempt! 05/07/21   Sueanne Margarita, MD  Vitamin D, Ergocalciferol, 50 MCG (2000 UT) CAPS Take 2,000 Units by mouth daily.    [provider]  Past Surgical History Past Surgical History:  Procedure Laterality Date   ABDOMINAL HYSTERECTOMY     AV FISTULA PLACEMENT Left 01/27/2014   Procedure: ARTERIOVENOUS (AV) FISTULA CREATION;  Surgeon: Mal Misty, MD;  Location: [facility];  Service: Vascular;  Laterality: Left;   AV FISTULA REPAIR     "had it cleaned out"   CARDIAC CATHETERIZATION  ?[place] N/A 12/04/2016   Procedure: Left Heart Cath Angiography;  Surgeon: Burnell Blanks, MD;  Location: [facility] CV LAB;  Service: Cardiovascular;  Laterality: N/A;   CARDIAC CATHETERIZATION N/A 12/04/2016   Procedure: Coronary Stent Intervention;  Surgeon: Burnell Blanks, MD;  Location: [facility] CV LAB;  Service: Cardiovascular;  Laterality: N/A;  Mid RCA   CATARACT EXTRACTION W/ INTRAOCULAR LENS IMPLANT Left    COLONOSCOPY W/ BIOPSIES AND POLYPECTOMY     CORONARY ANGIOPLASTY WITH STENT PLACEMENT  12/04/2016   ESOPHAGOGASTRODUODENOSCOPY     INSERTION OF DIALYSIS CATHETER N/A 01/19/2014   Procedure: INSERTION OF DIALYSIS CATHETER;  Surgeon: Rosetta Posner, MD;  Location[facility];  Service: Vascular;  Laterality: N/A;   Family History Family History  Problem Relation Age of Onset   Diabetes Mother    Peripheral vascular disease Mother        amputation   CAD Father    Heart disease Father    Hypertension Father    Heart attack Father    CAD Sister    Cancer Brother        x 2 brothers, type unknown   Breast  cancer Sister     Social History Social History   Tobacco Use   Smoking status: Former    Packs/day: 1.00    Years: 20.00    Pack years: 20.00    Types: Cigarettes    Quit date: 1985    Years since quitting: 38.0   Smokeless tobacco: Never  Vaping Use   Vaping Use: Never used  Substance Use Topics   Alcohol use: No    Alcohol/week: 0.0 standard drinks   Drug use: No   Allergies Other, Sulfa antibiotics, and Sulfa drugs cross reactors  Review of Systems Review of Systems  Respiratory:  Positive for shortness of breath.    Physical Exam Vital Signs  I have reviewed the triage vital signs BP 139/89    Pulse 71    Temp 98.3 F (36.8 C) (Oral)    Resp (!) 22    Ht 5\' 5"  (1.651 m)    Wt 112.9 kg    SpO2 99%    BMI 41.44 kg/m   Physical Exam Vitals and nursing note reviewed.  Constitutional:      General: She is not in acute distress.    Appearance: She is well-developed.  HENT:     Head: Normocephalic and atraumatic.  Eyes:     Conjunctiva/sclera: Conjunctivae normal.  Cardiovascular:     Rate and Rhythm: Normal rate and regular rhythm.     Heart sounds: No murmur heard. Pulmonary:     Effort: Pulmonary effort is normal. No respiratory distress.     Breath sounds: Normal breath sounds.  Abdominal:     Palpations: Abdomen is soft.     Tenderness: There is no abdominal tenderness.  Musculoskeletal:        General: No swelling.     Cervical back: Neck supple.  Skin:    General: Skin is warm and dry.     Capillary Refill: Capillary refill  takes less than 2 seconds.  Neurological:     Mental Status: She is alert.  Psychiatric:        Mood and Affect: Mood normal.    ED Results and Treatments Labs (all labs ordered are listed, but only abnormal results are displayed) Labs Reviewed  CBC WITH DIFFERENTIAL/PLATELET  BRAIN NATRIURETIC PEPTIDE  COMPREHENSIVE METABOLIC PANEL  TROPONIN I (HIGH SENSITIVITY)  TROPONIN I (HIGH SENSITIVITY)                                                                                                                           Radiology DG Chest Port 1 View  Result Date: 12/14/2021 CLINICAL DATA:  Dyspnea EXAM: PORTABLE CHEST 1 VIEW COMPARISON:  06/22/2019 FINDINGS: Single frontal view of the chest demonstrates external defibrillator pads overlying the left chest. Cardiac silhouette is stable. Mild central vascular congestion without airspace disease, effusion, or pneumothorax. No acute bony abnormalities. IMPRESSION: 1. Mild central vascular congestion.  No acute airspace disease. Electronically Signed   By: Randa Ngo M.D.   On: 12/14/2021 15:48    Pertinent labs & imaging results that were available during my care of the patient were reviewed by me and considered in my medical decision making (see MDM for details).  Medications Ordered in ED Medications - No data to display                                                                                                                                   Procedures Procedures  (including critical care time)  Medical Decision Making / ED Course   This patient presents to the ED for concern of shortness of breath, this involves an extensive number of treatment options, and is a complaint that carries with it a high risk of complications and morbidity.  The differential diagnosis includes fluid overload, pneumonia, COVID-19, pulmonary edema, ACS  MDM: Patient seen emergency department for evaluation of shortness of breath.  Physical exam is largely unremarkable.  Laboratory evaluation is currently pending.  Initial ECG with normal sinus rhythm with a prolonged PR interval.  Chest x-ray with mild venous congestion.  Patient then signed out to oncoming provider.  Please see provider signout for continuation of work-up.   Additional history obtained:  -External records from outside source obtained and reviewed including: Chart review including previous notes, labs,  imaging, consultation notes   Lab Tests: -  I ordered, reviewed, and interpreted labs.   The pertinent results include:   Labs Reviewed  CBC WITH DIFFERENTIAL/PLATELET  BRAIN NATRIURETIC PEPTIDE  COMPREHENSIVE METABOLIC PANEL  TROPONIN I (HIGH SENSITIVITY)  TROPONIN I (HIGH SENSITIVITY)      EKG  EKG Interpretation  Date/Time:  Friday December 14 2021 15:11:25 EST Ventricular Rate:  86 PR Interval:    QRS Duration: 104 QT Interval:  442 QTC Calculation: 481 R Axis:   88 Text Interpretation: Normal sinus rhythm Prolonged PR interval Confirmed by Gerhart Ruggieri ([id number]) on 12/14/2021 4:08:59 PM         Imaging Studies ordered: I ordered imaging studies including chest x-ray I independently visualized and interpreted imaging. I agree with the radiologist interpretation   Medicines ordered and prescription drug management: No orders of the defined types were placed in this encounter.   -I have reviewed the patients home medicines and have made adjustments as needed  Critical interventions none  Cardiac Monitoring: The patient was maintained on a cardiac monitor.  I personally viewed and interpreted the cardiac monitored which showed an underlying rhythm of: Normal sinus rhythm   Reevaluation: After the interventions noted above, I reevaluated the patient and found that they have :stayed the same  Co morbidities that complicate the patient evaluation  Past Medical History:  Diagnosis Date   Arthritis    "left knee" (12/04/2016)   CAD (coronary artery disease)    a. 12/2016 NSTEMI/PCI: LM nl, LaD 20p/d, D1 20, OM2 20, OM3 20, RCA 29m (3.0x18 Resolute DES). EF 65%.   Chronic lower back pain    Diverticulosis    on CT   ESRD on dialysis [facility])    "[place]; [place]; TTS" (12/04/2016)   GERD (gastroesophageal reflux disease)    Hiatal hernia    Hyperlipidemia    Hypertension    a. 12/2016 labetalol d/c'd 2/2 hypotension in HD.   Morbid obesity ([place])     Obesity    Pneumonia    "couple times" (12/04/2016)   Renal insufficiency    Type II diabetes mellitus ([place])       Dispostion: Pending laboratory evaluation     Final Clinical Impression(s) / ED Diagnoses Final diagnoses:  None     @PCDICTATION @    Teressa Lower, MD 12/14/21 1649

## 2021-12-14 NOTE — ED Notes (Signed)
Got patient undressed on the monitor did ekg shown to Dr Matilde Sprang patient is resting with call bell in reach

## 2021-12-14 NOTE — ED Provider Notes (Signed)
° °  4:10 PM Patient signed out to me by previous ED physician  76 yo female ESRD, covid +, cc sob. Pt was reported to be hypoxic by EMS.    Physical Exam  BP (!) 148/67 (BP Location: Right Arm)    Pulse 73    Temp 98.3 F (36.8 C) (Oral)    Resp 20    Ht 5\' 5"  (1.651 m)    Wt 112.9 kg    SpO2 97%    BMI 41.44 kg/m   Physical Exam Vitals and nursing note reviewed.  Constitutional:      General: She is not in acute distress.    Appearance: She is well-developed. She is morbidly obese.  HENT:     Head: Normocephalic and atraumatic.  Eyes:     Conjunctiva/sclera: Conjunctivae normal.  Cardiovascular:     Rate and Rhythm: Normal rate and regular rhythm.     Heart sounds: No murmur heard. Pulmonary:     Effort: Pulmonary effort is normal. No respiratory distress.     Breath sounds: Normal breath sounds.  Abdominal:     Palpations: Abdomen is soft.     Tenderness: There is no abdominal tenderness.  Musculoskeletal:        General: No swelling.     Cervical back: Neck supple.     Right lower leg: 2+ Edema present.     Left lower leg: 2+ Edema present.  Skin:    General: Skin is warm and dry.     Capillary Refill: Capillary refill takes less than 2 seconds.  Neurological:     Mental Status: She is alert.  Psychiatric:        Mood and Affect: Mood normal.    Procedures  Procedures  ED Course / MDM   Clinical Course as of 12/15/21 0935  Fri Dec 14, 2021  2028 B Natriuretic Peptide(!): 932.4 [AG]    Clinical Course User Index [AG] Lianne Cure, DO   Medical Decision Making  75 yo female ESRD, covid +, cc sob. Pt was reported to be hypoxic by EMS. After several trials in ED, no repeat hypoxia. Pt observed for 9 hours with no repeat hypoxia. CXR demonstrates no focal consolidations. Pt has hx put does not appear to be fluid overloaded on exam. Pulmonary vascular congestion on CXR without pleural effusions. BNP elevated but likely distorted due to hx of ESRD and dialysis.  No missed dialysis-as per patient.   Symptoms of SOB likely secondary to Covid virus. Recommendations for motrin/tylenol for myalgias/fevers, robitussin for cough, albuterol, and follow up with pcp if symptoms do not improve in 2 days. Pt dx with covid 2 weeks ago-outside 5 day recommended time window for Paxlovid. No hypoxia-dexamethasone not recommend at this time.   Patient in no distress and overall condition improved here in the ED. Detailed discussions were had with the patient regarding current findings, and need for close f/u with PCP or on call doctor. The patient has been instructed to return immediately if the symptoms worsen in any way for re-evaluation. Patient verbalized understanding and is in agreement with current care plan. All questions answered prior to discharge.         Campbell Stall P, DO 34/19/62 [phone]

## 2021-12-15 MED ORDER — IBUPROFEN 600 MG PO TABS: 600.0000 mg | ORAL_TABLET | Freq: Four times a day (QID) | ORAL | 0 refills | Status: DC | PRN

## 2021-12-15 MED ORDER — ALBUTEROL SULFATE HFA 108 (90 BASE) MCG/ACT IN AERS: 1.0000 | INHALATION_SPRAY | Freq: Four times a day (QID) | RESPIRATORY_TRACT | 0 refills | Status: DC | PRN

## 2021-12-15 MED ORDER — GUAIFENESIN 100 MG/5ML PO LIQD: 10.0000 mL | ORAL | 0 refills | Status: DC | PRN

## 2021-12-15 NOTE — ED Notes (Addendum)
Discharge instructions reviewed with patient and husband. Patient and husband verbalized understanding of instructions. Follow-up care and medications were reviewed. Patient wheeled out of Ed in wheelchair. VSS upon discharge.

## 2021-12-21 ENCOUNTER — Ambulatory Visit: Payer: Medicare Other

## 2022-01-01 DIAGNOSIS — N186 End stage renal disease: Secondary | ICD-10-CM | POA: Diagnosis not present

## 2022-01-01 DIAGNOSIS — E1129 Type 2 diabetes mellitus with other diabetic kidney complication: Secondary | ICD-10-CM | POA: Diagnosis not present

## 2022-01-01 DIAGNOSIS — Z992 Dependence on renal dialysis: Secondary | ICD-10-CM | POA: Diagnosis not present

## 2022-01-04 ENCOUNTER — Ambulatory Visit (INDEPENDENT_AMBULATORY_CARE_PROVIDER_SITE_OTHER): Payer: Medicare Other

## 2022-01-04 ENCOUNTER — Ambulatory Visit (HOSPITAL_COMMUNITY)
Admission: EM | Admit: 2022-01-04 | Discharge: 2022-01-04 | Disposition: A | Discharge status: Home / Self Care | Payer: Medicare Other | Attending: Family Medicine | Admitting: Family Medicine

## 2022-01-04 ENCOUNTER — Other Ambulatory Visit: Payer: Self-pay

## 2022-01-04 ENCOUNTER — Encounter (HOSPITAL_COMMUNITY): Payer: Self-pay

## 2022-01-04 DIAGNOSIS — E1159 Type 2 diabetes mellitus with other circulatory complications: Secondary | ICD-10-CM | POA: Diagnosis not present

## 2022-01-04 DIAGNOSIS — L84 Corns and callosities: Secondary | ICD-10-CM

## 2022-01-04 DIAGNOSIS — H60502 Unspecified acute noninfective otitis externa, left ear: Secondary | ICD-10-CM

## 2022-01-04 MED ORDER — NEOMYCIN-POLYMYXIN-HC 3.5-10000-1 OT SOLN: 3.0000 [drp] | Freq: Four times a day (QID) | OTIC | 0 refills | Status: AC

## 2022-01-04 NOTE — ED Triage Notes (Signed)
Pt presents with bilateral ear pain post covid  X 2 weeks.

## 2022-01-04 NOTE — ED Provider Notes (Signed)
[facility]    CSN: [id number] Arrival date & time: 01/04/22  1409      History   Chief Complaint Chief Complaint  Patient presents with   Otalgia    HPI Jacqueline [name] is a 76 y.o. female.    Otalgia Here for history of left ear pain in the last week or so.  It is actually feeling better now that she has put some sweet oil in it.  It is also itching and she notes drainage.  No blood in the drainage.  She actually states that her right ear is not bothering her.  No fever or chills in the last few days.  She did test positive for COVID on January 5.  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  She states she knew not to take ibuprofen already  Past Medical History:  Diagnosis Date   Arthritis    "left knee" (12/04/2016)   CAD (coronary artery disease)    a. 12/2016 NSTEMI/PCI: LM nl, LaD 20p/d, D1 20, OM2 20, OM3 20, RCA 15m (3.0x18 Resolute DES). EF 65%.   Chronic lower back pain    Diverticulosis    on CT   ESRD on dialysis [facility])    "[place]; [place]; TTS" (12/04/2016)   GERD (gastroesophageal reflux disease)    Hiatal hernia    Hyperlipidemia    Hypertension    a. 12/2016 labetalol d/c'd 2/2 hypotension in HD.   Morbid obesity ([place])    Obesity    Pneumonia    "couple times" (12/04/2016)   Renal insufficiency    Type II diabetes mellitus ([place])     Patient Active Problem List   Diagnosis Date Noted   Anaphylactic shock, unspecified, initial encounter 06/29/2020   Sepsis ([place]) 06/22/2019   Pain due to onychomycosis of toenails of both feet 06/11/2019   Callus 06/11/2019   Other disorders of phosphorus metabolism 05/20/2019   Mixed hyperlipidemia 03/22/2019   Otalgia of both ears 03/22/2019   Right arm pain 03/22/2019   Bradycardia 10/01/2017   Dyslipidemia 10/01/2017   Dependence on renal dialysis ([place]) 09/01/2017   Chest pain, unspecified 12/06/2016   Non-ST  elevation (NSTEMI) myocardial infarction ([place]) 12/06/2016   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   History of NSTEMI    CAD S/P percutaneous coronary angioplasty 12/03/2016   Hypokalemia 05/03/2016   Pruritus, unspecified 03/27/2015   Encounter for removal of sutures 11/30/2014   Pain in lower limb 08/26/2014   Encounter for immunization 06/10/2014   Unspecified protein-calorie malnutrition ([place]) 02/28/2014   Allergy, unspecified, initial encounter 02/15/2014   HCAP (healthcare-associated pneumonia) 02/13/2014   Absence of both cervix and uterus, acquired 02/01/2014   Coagulation defect, unspecified ([place]) 02/01/2014   Dyspnea, unspecified 02/01/2014   Family history of diabetes mellitus 02/01/2014   Family history of ischemic heart disease and other diseases of the circulatory system 02/01/2014   Fever, unspecified 02/01/2014   Headache, unspecified 02/01/2014   History of allergy to sulfonamides 02/01/2014   Iron deficiency anemia, unspecified 02/01/2014   Long term (current) use of insulin ([place]) 02/01/2014   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   OSA (obstructive sleep apnea) 01/21/2014   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[place]) 01/21/2014   Acute encephalopathy 01/19/2014   Congestive heart failure ([place]) 01/15/2014   ESRD (end stage renal disease) on dialysis ([place]) 01/15/2014   Hypertension 01/15/2014   Diabetes mellitus ([place]) 01/15/2014  Anemia 01/15/2014   Onychomycosis 03/26/2013   Pain in joint, ankle and foot 03/26/2013    Past Surgical History:  Procedure Laterality Date   ABDOMINAL HYSTERECTOMY     AV FISTULA PLACEMENT Left 01/27/2014   Procedure: ARTERIOVENOUS (AV) FISTULA CREATION;  Surgeon: Mal Misty, MD;  Location: [place];  Service: Vascular;  Laterality: Left;   AV FISTULA REPAIR     "had it cleaned out"   CARDIAC CATHETERIZATION  ?[place] N/A 12/04/2016   Procedure: Left Heart Cath Angiography;  Surgeon: Burnell Blanks, MD;  Location: [place] CV [facility];  Service: Cardiovascular;  Laterality: N/A;   CARDIAC CATHETERIZATION N/A 12/04/2016   Procedure: Coronary Stent Intervention;  Surgeon: Burnell Blanks, MD;  Location: [place] [facility];  Service: Cardiovascular;  Laterality: N/A;  Mid RCA   CATARACT EXTRACTION W/ INTRAOCULAR LENS IMPLANT Left    COLONOSCOPY W/ BIOPSIES AND POLYPECTOMY     CORONARY ANGIOPLASTY WITH STENT PLACEMENT  12/04/2016   ESOPHAGOGASTRODUODENOSCOPY     INSERTION OF DIALYSIS CATHETER N/A 01/19/2014   Procedure: INSERTION OF DIALYSIS CATHETER;  Surgeon: Rosetta Posner, MD;  Location: [facility] OR;  Service: Vascular;  Laterality: N/A;    OB History   No obstetric history on file.      Home Medications    Prior to Admission medications   Medication Sig Start Date End Date Taking? Authorizing Provider  neomycin-polymyxin-hydrocortisone (CORTISPORIN) OTIC solution Place 3 drops into the left ear 4 (four) times daily for 7 days. 01/04/22 01/11/22 Yes Marieme Mcmackin, Gwenlyn Perking, MD  acetaminophen (TYLENOL) 500 MG tablet Take 1,000 mg by mouth every 6 (six) hours as needed for moderate pain or headache.    [provider]  albuterol (VENTOLIN HFA) 108 (90 Base) MCG/ACT inhaler Inhale 1-2 puffs into the lungs every 6 (six) hours as needed for wheezing or shortness of breath. 04/02/76   Lianne Cure, DO  aspirin EC 81 MG EC tablet Take 1 tablet (81 mg total) by mouth daily. 12/06/16   Geradine Girt, DO  AURYXIA 1 GM 210 MG(Fe) tablet Take 420 mg by mouth 2 (two) times a day. 06/08/19   [provider]  B Complex-C-Folic Acid (DIALYVITE 412) 0.8 MG TABS Take 1 tablet by mouth daily. 09/29/18   [provider]  cinacalcet (SENSIPAR) 30 MG tablet Take 30 mg by mouth daily. 08/23/21   [provider]  Doxercalciferol (HECTOROL IV) Dialysis Tuesday,Thursday and saturday 08/14/21 08/13/22  [provider]  ezetimibe (ZETIA) 10 MG tablet TAKE 1 TABLET BY MOUTH   DAILY  OVERDUE FOLLOW UP  APPOINTMENT NEEDED FOR  FURTHER REFILLS. Patient taking differently: Take 10 mg by mouth daily. 04/13/21   Sueanne Margarita, MD  guaiFENesin (MUCINEX) 600 MG 12 hr tablet Take 600 mg by mouth 2 (two) times daily as needed for cough or to loosen phlegm.    [provider]  guaiFENesin (ROBITUSSIN) 100 MG/5ML liquid Take 5-10 mLs (100-200 mg total) by mouth every 4 (four) hours as needed for cough or to loosen phlegm. 07/08/85   Campbell Stall P, DO  guaiFENesin (ROBITUSSIN) 100 MG/5ML liquid Take 5 mLs by mouth every 4 (four) hours as needed for cough or to loosen phlegm.    [provider]  guaiFENesin (ROBITUSSIN) 100 MG/5ML liquid Take 10 mLs by mouth every 4 (four) hours as needed for cough or to loosen phlegm. 7/67/20   Lianne Cure, DO  metoCLOPramide (REGLAN)  10 MG tablet TAKE 1 TABLET BY MOUTH  TWICE DAILY 30 MINUTES  BEFORE MEALS Patient taking differently: Take 10 mg by mouth daily before breakfast. 05/29/21   [name], FNP  midodrine (PROAMATINE) 10 MG tablet Take 10 mg by mouth See admin instructions. Dialysis days [place] Saturday    [provider]  Omega-3 Fatty Acids (FISH OIL) 1000 MG CPDR Take 1,000 mg by mouth daily.     [provider]  omeprazole (PRILOSEC) 20 MG capsule TAKE 1 CAPSULE BY MOUTH EVERY DAY BEFORE A MEAL Patient taking differently: 20 mg daily. 08/22/21   [name], FNP  rosuvastatin (CRESTOR) 40 MG tablet Take 1 tablet (40 mg total) by mouth daily. Please schedule appointment for future refills. 3rd & FINAL attempt! 05/07/21   Sueanne Margarita, MD  Vitamin D, Ergocalciferol, 50 MCG (2000 UT) CAPS Take 2,000 Units by mouth daily.    [provider]    Family History Family History  Problem Relation Age of Onset   Diabetes Mother    Peripheral vascular disease Mother        amputation   CAD Father    Heart disease Father    Hypertension Father    Heart attack Father    CAD Sister     Cancer Brother        x 2 brothers, type unknown   Breast cancer Sister     Social History Social History   Tobacco Use   Smoking status: Former    Packs/day: 1.00    Years: 20.00    Pack years: 20.00    Types: Cigarettes    Quit date: 1985    Years since quitting: 38.1   Smokeless tobacco: Never  Vaping Use   Vaping Use: Never used  Substance Use Topics   Alcohol use: No    Alcohol/week: 0.0 standard drinks   Drug use: No     Allergies   Other, Sulfa antibiotics, and Sulfa drugs cross reactors   Review of Systems Review of Systems  HENT:  Positive for ear pain.     Physical Exam Triage Vital Signs ED Triage Vitals [01/04/22 1500]  Enc Vitals Group     BP 135/79     Pulse Rate 94     Resp 20     Temp 98.3 F (36.8 C)     Temp Source Oral     SpO2 92 %     Weight      Height      Head Circumference      Peak Flow      Pain Score 7     Pain Loc      Pain Edu?      Excl. in [place]?    No data found.  Updated Vital Signs BP 135/79 (BP Location: Right Arm)    Pulse 94    Temp 98.3 F (36.8 C) (Oral)    Resp 20    SpO2 92%   Visual Acuity Right Eye Distance:   Left Eye Distance:   Bilateral Distance:    Right Eye Near:   Left Eye Near:    Bilateral Near:     Physical Exam Vitals reviewed.  Constitutional:      General: She is not in acute distress.    Appearance: She is not toxic-appearing.  HENT:     Right Ear: Tympanic membrane and ear canal normal.     Ears:     Comments: Left TM  is gray and shiny. White dc is adherent to the walls of the left ear canal.     Nose: Nose normal.     Mouth/Throat:     Mouth: Mucous membranes are moist.     Pharynx: No oropharyngeal exudate or posterior oropharyngeal erythema.  Eyes:     Extraocular Movements: Extraocular movements intact.     Conjunctiva/sclera: Conjunctivae normal.     Pupils: Pupils are equal, round, and reactive to light.  Cardiovascular:     Rate and Rhythm: Normal rate and regular  rhythm.     Heart sounds: No murmur heard. Pulmonary:     Effort: Pulmonary effort is normal. No respiratory distress.     Breath sounds: No wheezing, rhonchi or rales.  Chest:     Chest wall: No tenderness.  Musculoskeletal:     Cervical back: Neck supple.  Lymphadenopathy:     Cervical: No cervical adenopathy.  Skin:    Capillary Refill: Capillary refill takes less than 2 seconds.     Coloration: Skin is not jaundiced or pale.  Neurological:     General: No focal deficit present.     Mental Status: She is alert and oriented to person, place, and time.  Psychiatric:        Behavior: Behavior normal.     UC Treatments / Results  Labs (all labs ordered are listed, but only abnormal results are displayed) Labs Reviewed - No data to display  EKG   Radiology No results found.  Procedures Procedures (including critical care time)  Medications Ordered in UC Medications - No data to display  Initial Impression / Assessment and Plan / UC Course  I have reviewed the triage vital signs and the nursing notes.  Pertinent labs & imaging results that were available during my care of the patient were reviewed by me and considered in my medical decision making (see chart for details).     Will treat for OE Final Clinical Impressions(s) / UC Diagnoses   Final diagnoses:  Acute otitis externa of left ear, unspecified type     Discharge Instructions      You have an external ear infection.  Put Cortisporin drops in your left ear 4 times a day for 1 week.     ED Prescriptions     Medication Sig Dispense Auth. Provider   neomycin-polymyxin-hydrocortisone (CORTISPORIN) OTIC solution Place 3 drops into the left ear 4 (four) times daily for 7 days. 10 mL Barrett Henle, MD      PDMP not reviewed this encounter.   Barrett Henle, MD 01/04/22 1540

## 2022-01-04 NOTE — Discharge Instructions (Signed)
You have an external ear infection.  Put Cortisporin drops in your left ear 4 times a day for 1 week.

## 2022-01-04 NOTE — Progress Notes (Signed)
SITUATION Reason for Visit: Fitting of Diabetic Shoes & Insoles Patient / Caregiver Report:  Patient is satisfied with diabetic shoes  OBJECTIVE DATA: Patient History / Diagnosis:     ICD-10-CM   1. Type 2 diabetes mellitus with vascular disease (HCC)  E11.59     2. Callus  L84       Change in Status:   None  ACTIONS PERFORMED: In-Person Delivery, patient was fit with: - 1x pair [id number] PDAC approved prefabricated Diabetic Shoes: Apex  - 3x pair [id number] PDAC approved CAM milled custom diabetic insoles  Shoes and insoles were verified for structural integrity and safety. Patient wore shoes and insoles in office. Skin was inspected and free of areas of concern after wearing shoes and inserts. Shoes and inserts fit properly. Patient / Caregiver provided with ferbal instruction and demonstration regarding donning, doffing, wear, care, proper fit, function, purpose, cleaning, and use of shoes and insoles ' and in all related precautions and risks and benefits regarding shoes and insoles. Patient / Caregiver was instructed to wear properly fitting socks with shoes at all times. Patient was also provided with verbal instruction regarding how to report any failures or malfunctions of shoes or inserts, and necessary follow up care. Patient / Caregiver was also instructed to contact physician regarding change in status that may affect function of shoes and inserts.   Patient / Caregiver verbalized undersatnding of instruction provided. Patient / Caregiver demonstrated independence with proper donning and doffing of shoes and inserts.  PLAN Patient to follow up as needed. Plan of care was discussed with and agreed upon by patient and/or caregiver. All questions were answered and concerns addressed.

## 2022-01-14 ENCOUNTER — Encounter: Payer: Medicare Other | Admitting: Nurse Practitioner

## 2022-01-14 NOTE — Patient Instructions (Signed)

## 2022-01-14 NOTE — Progress Notes (Deleted)
I,Kathern Lobosco T Tamula Morrical,acting as a Education administrator for [name], FNP.,have documented all relevant documentation on the behalf of [name], FNP,as directed by  [name], FNP while in the presence of [name], [place].  This visit occurred during the SARS-CoV-2 public health emergency.  Safety protocols were in place, including screening questions prior to the visit, additional usage of staff PPE, and extensive cleaning of exam room while observing appropriate contact time as indicated for disinfecting solutions.  Subjective:     Patient ID: Jacqueline [name] , female    DOB: [birth year] , 76 y.o.   MRN: [id number]   Chief Complaint  Patient presents with   Hypertension   Diabetes    HPI  Pt presents today for BP & DM f/u.  Hypertension  Diabetes    Past Medical History:  Diagnosis Date   Arthritis    "left knee" (12/04/2016)   CAD (coronary artery disease)    a. 12/2016 NSTEMI/PCI: LM nl, LaD 20p/d, D1 20, OM2 20, OM3 20, RCA 76m (3.0x18 Resolute DES). EF 65%.   Chronic lower back pain    Diverticulosis    on CT   ESRD on dialysis [facility])    "[place]; [place]; TTS" (12/04/2016)   GERD (gastroesophageal reflux disease)    Hiatal hernia    Hyperlipidemia    Hypertension    a. 12/2016 labetalol d/c'd 2/2 hypotension in HD.   Morbid obesity ([place])    Obesity    Pneumonia    "couple times" (12/04/2016)   Renal insufficiency    Type II diabetes mellitus ([place])      Family History  Problem Relation Age of Onset   Diabetes Mother    Peripheral vascular disease Mother        amputation   CAD Father    Heart disease Father    Hypertension Father    Heart attack Father    CAD Sister    Cancer Brother        x 2 brothers, type unknown   Breast cancer Sister      Current Outpatient Medications:    acetaminophen (TYLENOL) 500 MG tablet, Take 1,000 mg by mouth every 6 (six) hours as needed for moderate pain or headache., Disp: , Rfl:    albuterol (VENTOLIN HFA)  108 (90 Base) MCG/ACT inhaler, Inhale 1-2 puffs into the lungs every 6 (six) hours as needed for wheezing or shortness of breath., Disp: 1 each, Rfl: 0   aspirin EC 81 MG EC tablet, Take 1 tablet (81 mg total) by mouth daily., Disp: , Rfl:    AURYXIA 1 GM 210 MG(Fe) tablet, Take 420 mg by mouth 2 (two) times a day., Disp: , Rfl:    B Complex-C-Folic Acid (DIALYVITE 469) 0.8 MG TABS, Take 1 tablet by mouth daily., Disp: , Rfl:    cinacalcet (SENSIPAR) 30 MG tablet, Take 30 mg by mouth daily., Disp: , Rfl:    Doxercalciferol (HECTOROL IV), Dialysis Tuesday,Thursday and saturday, Disp: , Rfl:    ezetimibe (ZETIA) 10 MG tablet, TAKE 1 TABLET BY MOUTH  DAILY  OVERDUE FOLLOW UP  APPOINTMENT NEEDED FOR  FURTHER REFILLS. (Patient taking differently: Take 10 mg by mouth daily.), Disp: 15 tablet, Rfl: 0   guaiFENesin (MUCINEX) 600 MG 12 hr tablet, Take 600 mg by mouth 2 (two) times daily as needed for cough or to loosen phlegm., Disp: , Rfl:    guaiFENesin (ROBITUSSIN) 100 MG/5ML liquid, Take 5-10 mLs (100-200 mg total) by mouth  every 4 (four) hours as needed for cough or to loosen phlegm., Disp: 60 mL, Rfl: 0   guaiFENesin (ROBITUSSIN) 100 MG/5ML liquid, Take 5 mLs by mouth every 4 (four) hours as needed for cough or to loosen phlegm., Disp: , Rfl:    guaiFENesin (ROBITUSSIN) 100 MG/5ML liquid, Take 10 mLs by mouth every 4 (four) hours as needed for cough or to loosen phlegm., Disp: 60 mL, Rfl: 0   metoCLOPramide (REGLAN) 10 MG tablet, TAKE 1 TABLET BY MOUTH  TWICE DAILY 30 MINUTES  BEFORE MEALS (Patient taking differently: Take 10 mg by mouth daily before breakfast.), Disp: 120 tablet, Rfl: 0   midodrine (PROAMATINE) 10 MG tablet, Take 10 mg by mouth See admin instructions. Dialysis days Tues, Thurs Saturday, Disp: , Rfl:    Omega-3 Fatty Acids (FISH OIL) 1000 MG CPDR, Take 1,000 mg by mouth daily. , Disp: , Rfl:    omeprazole (PRILOSEC) 20 MG capsule, TAKE 1 CAPSULE BY MOUTH EVERY DAY BEFORE A MEAL (Patient  taking differently: 20 mg daily.), Disp: 90 capsule, Rfl: 1   rosuvastatin (CRESTOR) 40 MG tablet, Take 1 tablet (40 mg total) by mouth daily. Please schedule appointment for future refills. 3rd & FINAL attempt!, Disp: 15 tablet, Rfl: 0   Vitamin D, Ergocalciferol, 50 MCG (2000 UT) CAPS, Take 2,000 Units by mouth daily., Disp: , Rfl:    Allergies  Allergen Reactions   Other Other (See Comments)   Sulfa Antibiotics Other (See Comments)   Sulfa Drugs Cross Reactors Other (See Comments)    Doesn't remember      Review of Systems  Constitutional: Negative.   Respiratory: Negative.    Cardiovascular: Negative.   Neurological: Negative.   Psychiatric/Behavioral: Negative.      There were no vitals filed for this visit. There is no height or weight on file to calculate BMI.   Objective:  Physical Exam      Assessment And Plan:     1. Chronic kidney disease, stage I  2. Encounter for extracorporeal dialysis [facility])     Patient was given opportunity to ask questions. Patient verbalized understanding of the plan and was able to repeat key elements of the plan. All questions were answered to their satisfaction.  Debbora Dus, CMA   I, Debbora Dus, CMA, have reviewed all documentation for this visit. The documentation on 01/14/22 for the exam, diagnosis, procedures, and orders are all accurate and complete.   IF YOU HAVE BEEN REFERRED TO A SPECIALIST, IT MAY TAKE 1-2 WEEKS TO SCHEDULE/PROCESS THE REFERRAL. IF YOU HAVE NOT HEARD FROM US/SPECIALIST IN TWO WEEKS, PLEASE GIVE [place] A CALL AT [phone].   THE PATIENT IS ENCOURAGED TO PRACTICE SOCIAL DISTANCING DUE TO THE COVID-19 PANDEMIC.

## 2022-01-23 NOTE — Progress Notes (Signed)
No Show

## 2022-01-29 DIAGNOSIS — N186 End stage renal disease: Secondary | ICD-10-CM | POA: Diagnosis not present

## 2022-01-29 DIAGNOSIS — E1129 Type 2 diabetes mellitus with other diabetic kidney complication: Secondary | ICD-10-CM | POA: Diagnosis not present

## 2022-01-29 DIAGNOSIS — Z992 Dependence on renal dialysis: Secondary | ICD-10-CM | POA: Diagnosis not present

## 2022-02-12 ENCOUNTER — Encounter: Payer: Self-pay | Admitting: Emergency Medicine

## 2022-02-12 ENCOUNTER — Emergency Department
Admission: EM | Admit: 2022-02-12 | Discharge: 2022-02-12 | Disposition: A | Discharge status: Home / Self Care | Payer: Medicare Other | Attending: Emergency Medicine | Admitting: Emergency Medicine

## 2022-02-12 ENCOUNTER — Emergency Department: Payer: Medicare Other

## 2022-02-12 ENCOUNTER — Other Ambulatory Visit: Payer: Self-pay

## 2022-02-12 DIAGNOSIS — Z794 Long term (current) use of insulin: Secondary | ICD-10-CM | POA: Insufficient documentation

## 2022-02-12 DIAGNOSIS — E1122 Type 2 diabetes mellitus with diabetic chronic kidney disease: Secondary | ICD-10-CM | POA: Insufficient documentation

## 2022-02-12 DIAGNOSIS — N186 End stage renal disease: Secondary | ICD-10-CM | POA: Insufficient documentation

## 2022-02-12 DIAGNOSIS — I2510 Atherosclerotic heart disease of native coronary artery without angina pectoris: Secondary | ICD-10-CM | POA: Diagnosis not present

## 2022-02-12 DIAGNOSIS — R10816 Epigastric abdominal tenderness: Secondary | ICD-10-CM | POA: Insufficient documentation

## 2022-02-12 DIAGNOSIS — I132 Hypertensive heart and chronic kidney disease with heart failure and with stage 5 chronic kidney disease, or end stage renal disease: Secondary | ICD-10-CM | POA: Diagnosis not present

## 2022-02-12 DIAGNOSIS — I10 Essential (primary) hypertension: Secondary | ICD-10-CM | POA: Diagnosis not present

## 2022-02-12 DIAGNOSIS — I509 Heart failure, unspecified: Secondary | ICD-10-CM | POA: Insufficient documentation

## 2022-02-12 DIAGNOSIS — R195 Other fecal abnormalities: Secondary | ICD-10-CM | POA: Insufficient documentation

## 2022-02-12 DIAGNOSIS — R112 Nausea with vomiting, unspecified: Secondary | ICD-10-CM | POA: Diagnosis not present

## 2022-02-12 DIAGNOSIS — R109 Unspecified abdominal pain: Secondary | ICD-10-CM | POA: Diagnosis not present

## 2022-02-12 DIAGNOSIS — Z992 Dependence on renal dialysis: Secondary | ICD-10-CM | POA: Insufficient documentation

## 2022-02-12 DIAGNOSIS — I700 Atherosclerosis of aorta: Secondary | ICD-10-CM | POA: Diagnosis not present

## 2022-02-12 LAB — COMPREHENSIVE METABOLIC PANEL
ALT: 15 U/L (ref 0–44)
AST: 29 U/L (ref 15–41)
Albumin: 3.2 g/dL — ABNORMAL LOW (ref 3.5–5.0)
Alkaline Phosphatase: 79 U/L (ref 38–126)
Anion gap: 10 (ref 5–15)
BUN: 21 mg/dL (ref 8–23)
CO2: 30 mmol/L (ref 22–32)
Calcium: 8.8 mg/dL — ABNORMAL LOW (ref 8.9–10.3)
Chloride: 95 mmol/L — ABNORMAL LOW (ref 98–111)
Creatinine, Ser: 4.81 mg/dL — ABNORMAL HIGH (ref 0.44–1.00)
GFR, Estimated: 9 mL/min — ABNORMAL LOW (ref >60)
Glucose, Bld: 140 mg/dL — ABNORMAL HIGH (ref 70–99)
Potassium: 4.1 mmol/L (ref 3.5–5.1)
Sodium: 135 mmol/L (ref 135–145)
Total Bilirubin: 0.6 mg/dL (ref 0.3–1.2)
Total Protein: 7.9 g/dL (ref 6.5–8.1)

## 2022-02-12 LAB — CBC
HCT: 36.5 % (ref 36.0–46.0)
Hemoglobin: 11.1 g/dL — ABNORMAL LOW (ref 12.0–15.0)
MCH: 30.2 pg (ref 26.0–34.0)
MCHC: 30.4 g/dL (ref 30.0–36.0)
MCV: 99.5 fL (ref 80.0–100.0)
Platelets: 174 10*3/uL (ref 150–400)
RBC: 3.67 MIL/uL — ABNORMAL LOW (ref 3.87–5.11)
RDW: 15.7 % — ABNORMAL HIGH (ref 11.5–15.5)
WBC: 10.1 10*3/uL (ref 4.0–10.5)
nRBC: 0 % (ref 0.0–0.2)

## 2022-02-12 LAB — LIPASE, BLOOD: Lipase: 47 U/L (ref 11–51)

## 2022-02-12 MED ORDER — ONDANSETRON HCL 4 MG PO TABS: 4.0000 mg | ORAL_TABLET | Freq: Every day | ORAL | 0 refills | Status: DC | PRN

## 2022-02-12 MED ORDER — IOHEXOL 300 MG/ML  SOLN
100.0000 mL | Freq: Once | INTRAMUSCULAR | Status: AC | PRN
  Administered 2022-02-12: 100 mL via INTRAVENOUS

## 2022-02-12 MED ORDER — ONDANSETRON HCL 4 MG PO TABS: 4.0000 mg | ORAL_TABLET | Freq: Every day | ORAL | 0 refills | Status: AC | PRN

## 2022-02-12 MED ORDER — PANTOPRAZOLE SODIUM 40 MG IV SOLR
40.0000 mg | Freq: Once | INTRAVENOUS | Status: AC
Start: 2022-02-12 — End: 2022-02-12
  Administered 2022-02-12: 40 mg via INTRAVENOUS
  Filled 2022-02-12: qty 10

## 2022-02-12 MED ORDER — ONDANSETRON HCL 4 MG/2ML IJ SOLN
4.0000 mg | Freq: Once | INTRAMUSCULAR | Status: AC
  Administered 2022-02-12: 4 mg via INTRAVENOUS
  Filled 2022-02-12: qty 2

## 2022-02-12 NOTE — ED Notes (Signed)
Pt placed on BP & pulse ox monitors by CNA. ?

## 2022-02-12 NOTE — ED Provider Notes (Signed)
? ?[facility] ?Provider Note ? ? ? Event Date/Time  ? First MD Initiated Contact with Patient 02/12/22 1900   ?  (approximate) ? ? ?History  ? ?Hematemesis ? ? ?HPI ? ?Jacqueline [name] is a 76 y.o. female past medical history of GERD, end-stage renal disease, dialysis Tuesday Thursday Saturday, hyperlipidemia, hypertension, obesity, coronary disease who presents with vomiting.  Started at 1 AM last night.  Patient's son said the vomit looked dark brown was concern for potentially blood.  She has had several episodes today that was just the food that she had eaten but is not blood or black or brown.  Denies bright red blood per rectum.  She chronically has black stool due to the binder she takes for her end-stage renal disease.  Denies history of GI bleeding.  She did have some associated discomfort in her upper chest after vomiting but denies ongoing ongoing chest pain no increase in her chronic shortness of breath denies fevers or chills. ?  ? ?Past Medical History:  ?Diagnosis Date  ? Arthritis   ? "left knee" (12/04/2016)  ? CAD (coronary artery disease)   ? a. 12/2016 NSTEMI/PCI: LM nl, LaD 20p/d, D1 20, OM2 20, OM3 20, RCA 47m (3.0x18 Resolute DES). EF 65%.  ? Chronic lower back pain   ? Diverticulosis   ? on CT  ? ESRD on dialysis [facility])   ? "[facility]; [place]; TTS" (12/04/2016)  ? GERD (gastroesophageal reflux disease)   ? Hiatal hernia   ? Hyperlipidemia   ? Hypertension   ? a. 12/2016 labetalol d/c'd 2/2 hypotension in HD.  ? Morbid obesity ([place])   ? Obesity   ? Pneumonia   ? "couple times" (12/04/2016)  ? Renal insufficiency   ? Type II diabetes mellitus ([place])   ? ? ?Patient Active Problem List  ? Diagnosis Date Noted  ? Anaphylactic shock, unspecified, initial encounter 06/29/2020  ? Sepsis ([place]) 06/22/2019  ? Pain due to onychomycosis of toenails of both feet 06/11/2019  ? Callus 06/11/2019  ? Other disorders of phosphorus metabolism 05/20/2019  ? Mixed hyperlipidemia  03/22/2019  ? Otalgia of both ears 03/22/2019  ? Right arm pain 03/22/2019  ? Bradycardia 10/01/2017  ? Dyslipidemia 10/01/2017  ? Dependence on renal dialysis ([place]) 09/01/2017  ? Chest pain, unspecified 12/06/2016  ? Non-ST elevation (NSTEMI) myocardial infarction ([place]) 12/06/2016  ? 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 History of NSTEMI   ? CAD S/P percutaneous coronary angioplasty 12/03/2016  ? Hypokalemia 05/03/2016  ? Pruritus, unspecified 03/27/2015  ? Encounter for removal of sutures 11/30/2014  ? Pain in lower limb 08/26/2014  ? Encounter for immunization 06/10/2014  ? Unspecified protein-calorie malnutrition ([place]) 02/28/2014  ? Allergy, unspecified, initial encounter 02/15/2014  ? HCAP (healthcare-associated pneumonia) 02/13/2014  ? Absence of both cervix and uterus, acquired 02/01/2014  ? Coagulation defect, unspecified ([place]) 02/01/2014  ? Dyspnea, unspecified 02/01/2014  ? Family history of diabetes mellitus 02/01/2014  ? Family history of ischemic heart disease and other diseases of the circulatory system 02/01/2014  ? Fever, unspecified 02/01/2014  ? Headache, unspecified 02/01/2014  ? History of allergy to sulfonamides 02/01/2014  ? Iron deficiency anemia, unspecified 02/01/2014  ? Long term (current) use of insulin ([employer]) 02/01/2014  ? 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 OSA (obstructive sleep apnea) 01/21/2014  ? 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[place]) 01/21/2014  ? Acute encephalopathy 01/19/2014  ? Congestive heart failure ([place]) 01/15/2014  ? ESRD (end stage renal disease)  on dialysis [facility]) 01/15/2014  ? Hypertension 01/15/2014  ? Diabetes mellitus ([place]) 01/15/2014  ? Anemia 01/15/2014  ? Onychomycosis 03/26/2013  ? Pain in joint, ankle and foot 03/26/2013  ? ? ? ?Physical Exam  ?Triage Vital Signs: ?ED Triage Vitals  ?Enc Vitals Group  ?   BP 02/12/22 1843 (!) 108/58  ?   Pulse Rate 02/12/22 1843 81  ?   Resp 02/12/22 1843 18  ?   Temp 02/12/22 1843 98.2 ?F (36.8 ?C)  ?   Temp  Source 02/12/22 1843 Oral  ?   SpO2 02/12/22 1843 95 %  ?   Weight --   ?   Height --   ?   Head Circumference --   ?   Peak Flow --   ?   Pain Score 02/12/22 1851 6  ?   Pain Loc --   ?   Pain Edu? --   ?   Excl. in [place]? --   ? ? ?Most recent vital signs: ?Vitals:  ? 02/12/22 1843 02/12/22 2250  ?BP: (!) 108/58 114/78  ?Pulse: 81 79  ?Resp: 18 15  ?Temp:    ?SpO2: 90% 96%  ? ? ? ?General: Awake, no distress.  ?CV:  Good peripheral perfusion.  ?Resp:  Normal effort.  ?Abd:  No distention.  Soft, mild tenderness to palpation of right lower quadrant and epigastric region ?Neuro:             Awake, Alert, Oriented x 3  ?Other:  Black stool on rectal exam that is guaiac negative ? ? ?ED Results / Procedures / Treatments  ?Labs ?(all labs ordered are listed, but only abnormal results are displayed) ?Labs Reviewed  ?COMPREHENSIVE METABOLIC PANEL - Abnormal; Notable for the following components:  ?    Result Value  ? Chloride 95 (*)   ? Glucose, Bld 140 (*)   ? Creatinine, Ser 4.81 (*)   ? Calcium 8.8 (*)   ? Albumin 3.2 (*)   ? GFR, Estimated 9 (*)   ? All other components within normal limits  ?CBC - Abnormal; Notable for the following components:  ? RBC 3.67 (*)   ? Hemoglobin 11.1 (*)   ? RDW 15.7 (*)   ? All other components within normal limits  ?LIPASE, BLOOD  ? ? ? ?EKG ? ? ? ? ?RADIOLOGY ?I reviewed the patient's CT of the abdomen which is negative for acute process ? ? ?PROCEDURES: ? ?Critical Care performed: No ? ?.1-3 Lead EKG Interpretation ?Performed by: Rada Hay, MD ?Authorized by: Rada Hay, MD  ? ?  Interpretation: normal   ?  ECG rate assessment: normal   ?  Ectopy: none   ?  Conduction: normal   ? ?The patient is on the cardiac monitor to evaluate for evidence of arrhythmia and/or significant heart rate changes. ? ? ?MEDICATIONS ORDERED IN ED: ?Medications  ?pantoprazole (PROTONIX) injection 40 mg (40 mg Intravenous Given 02/12/22 2032)  ?ondansetron [facility] injection 4 mg (4 mg  Intravenous Given 02/12/22 2032)  ?iohexol (OMNIPAQUE) 300 MG/ML solution 100 mL (100 mLs Intravenous Contrast Given 02/12/22 2034)  ? ? ? ?IMPRESSION / MDM / ASSESSMENT AND PLAN / ED COURSE  ?I reviewed the triage vital signs and the nursing notes. ?             ?               ? ?Differential diagnosis includes, but is not limited  to, upper GI bleed, gastritis, gastroenteritis, peptic ulcer, pancreatitis, biliary pathology ? ?Patient is a 76 year old female with history of end-stage renal disease who presents with concern for potential hematemesis.  Patient had an episode of vomiting around 1 AM yes night that looked dark brown to her son.  They are not sure if it could have been bloody.  Has had several episodes since that time that is just been food and nothing that appeared bloody black or brown.  Patient has some associated high epigastric discomfort feeling like something is stuck there but denies any ongoing abdominal pain.  Has not had diarrhea.  Patient notes that due to the binder she takes for end-stage renal disease her stool is always black has not had any blood in it.  No history of GI bleeding.  She denies fevers chills.  Denies chest pain or shortness of breath.  On exam she overall appears well.  Abdomen is mildly tender in the epigastric region and somewhat in the lower quadrants as well so we will obtain a CT of the abdomen pelvis.  Rectal exam has black stool just as patient said but it is guaiac negative.  Ultimately unclear whether the patient truly had hematemesis I suspect that this may have not been the case.  Her hemoglobin is 11 which is at its baseline.  Reassured that she has had several episodes of vomiting since that have not looked anything like blood.  Has not vomited while in the ED.  Her CMP is largely unremarkable.  No hyperkalemia.  Lipase is negative.  She was given Protonix and Zofran.  Afterwards she tolerated fluids and several packs of crackers without difficulty.  Denies any  ongoing pain.  Ultimately with no confirmed hematemesis stable hemoglobin no blood in rectal exam I think she is appropriate for discharge home.  I discussed strict return precautions with both her and the family

## 2022-02-12 NOTE — ED Notes (Signed)
Pt transported back to room from CT via stretcher with CT tech. ?

## 2022-02-12 NOTE — ED Notes (Signed)
Ginger ale & saltines given to pt for PO challenge.  ? ?Pt repositioned (HOB elevated) to eat. ?

## 2022-02-12 NOTE — ED Notes (Signed)
Pt transported to CT via stretcher with CT tech. °

## 2022-02-12 NOTE — Discharge Instructions (Addendum)
If you see anything that you are concerned could be blood in your vomit please return to the emergency department immediately.  If you are unable to eat and drink or have worsening pain, please return to the ED.  Otherwise please follow-up with your primary care provider for your going to dialysis as scheduled. ?

## 2022-02-12 NOTE — ED Triage Notes (Signed)
Pt to ED via POV with son, son reports that his mother had black emesis this am, she went to dialysis and was ok with it, she came home and fell asleep and woke up and had another episode of black emesis.  ?

## 2022-02-12 NOTE — ED Notes (Signed)
Pt able to take in several packs of saltine crackers & ginger ale without vomiting. ?

## 2022-02-13 ENCOUNTER — Telehealth: Payer: Self-pay

## 2022-02-13 NOTE — Telephone Encounter (Signed)
Transition Care Management Unsuccessful Follow-up Telephone Call ? ?Date of discharge and from where:  02/12/2022 [place] [facility].  ? ?Attempts:  1st Attempt ? ?Reason for unsuccessful TCM follow-up call:  No answer/busy ? ?  ?

## 2022-02-18 ENCOUNTER — Other Ambulatory Visit: Payer: Self-pay

## 2022-02-18 ENCOUNTER — Telehealth: Payer: Self-pay

## 2022-02-18 ENCOUNTER — Encounter: Payer: Self-pay | Admitting: Podiatry

## 2022-02-18 ENCOUNTER — Telehealth: Payer: Self-pay | Admitting: Podiatry

## 2022-02-18 ENCOUNTER — Ambulatory Visit (INDEPENDENT_AMBULATORY_CARE_PROVIDER_SITE_OTHER): Payer: Medicare Other | Admitting: Podiatry

## 2022-02-18 DIAGNOSIS — N186 End stage renal disease: Secondary | ICD-10-CM

## 2022-02-18 DIAGNOSIS — M79674 Pain in right toe(s): Secondary | ICD-10-CM

## 2022-02-18 DIAGNOSIS — M79675 Pain in left toe(s): Secondary | ICD-10-CM | POA: Diagnosis not present

## 2022-02-18 DIAGNOSIS — E1159 Type 2 diabetes mellitus with other circulatory complications: Secondary | ICD-10-CM

## 2022-02-18 DIAGNOSIS — B351 Tinea unguium: Secondary | ICD-10-CM

## 2022-02-18 DIAGNOSIS — D689 Coagulation defect, unspecified: Secondary | ICD-10-CM

## 2022-02-18 NOTE — Telephone Encounter (Signed)
I returned the pt's call, unable to leave a message because the voicemail isn't setup. ?

## 2022-02-18 NOTE — Telephone Encounter (Signed)
Patient called she is having severe pain in her feet - they are burning and hurting, causing her not to be able to sleep or not do anything.  Patient is requesting pain medication , please advise?

## 2022-02-18 NOTE — Progress Notes (Signed)
This patient returns to my office for at risk foot care.  This patient requires this care by a professional since this patient will be at risk due to having  Diabetes.  This patient is unable to cut nails herself since the patient cannot reach her nails.These nails are painful walking and wearing shoes.  Patient says she is experiencing severe pain in both her feet since last Thursday.  She says the pain is severe and keeps her up. This patient presents for at risk foot care today.  She is brought to the office by her son.   ? ?General Appearance  Alert, conversant and in no acute stress. ? ?Vascular  Dorsalis pedis and posterior tibial  pulses are not  palpable  bilaterally.  Capillary return is within normal limits  bilaterally. Temperature is within normal limits  bilaterally. ? ?Neurologic  Senn-Weinstein monofilament wire test within normal limits  bilaterally. Muscle power within normal limits bilaterally. ? ?Nails Thick disfigured discolored nails with subungual debris  from hallux to fifth toes bilaterally. No evidence of bacterial infection or drainage bilaterally. ? ?Orthopedic  No limitations of motion  feet .  No crepitus or effusions noted.  No bony pathology or digital deformities noted. ? ?Skin  normotropic skin with no porokeratosis noted bilaterally.  No signs of infections or ulcers noted. Asymptomatic callus left hallux.  ? ?Onychomycosis  Pain in right toes  Pain in left toes  ROV. ? ?Consent was obtained for treatment procedures.   Mechanical debridement of nails 1-5  bilaterally performed with a nail nipper.  Filed with dremel without incident.  Discussed her severe foot pain.  I am concerned about her vascular status,  Therefore I requested vascular studies be performed in [place]  STAT. ? ? ?Return office visit     4 months  for nail care               Told patient to return for periodic foot care and evaluation due to potential at risk complications. ? ? ?Gardiner Barefoot DPM  ?

## 2022-02-20 ENCOUNTER — Other Ambulatory Visit: Payer: Self-pay | Admitting: Cardiology

## 2022-02-20 ENCOUNTER — Other Ambulatory Visit: Payer: Self-pay | Admitting: Nurse Practitioner

## 2022-02-21 ENCOUNTER — Other Ambulatory Visit: Payer: Self-pay | Admitting: Nurse Practitioner

## 2022-02-25 ENCOUNTER — Other Ambulatory Visit: Payer: Self-pay

## 2022-02-25 ENCOUNTER — Ambulatory Visit (INDEPENDENT_AMBULATORY_CARE_PROVIDER_SITE_OTHER): Payer: Medicare Other | Admitting: Nurse Practitioner

## 2022-02-25 ENCOUNTER — Encounter: Payer: Self-pay | Admitting: Nurse Practitioner

## 2022-02-25 VITALS — BP 116/74 | HR 83 | Temp 98.0°F | Ht 65.0 in

## 2022-02-25 DIAGNOSIS — H9202 Otalgia, left ear: Secondary | ICD-10-CM

## 2022-02-25 DIAGNOSIS — I509 Heart failure, unspecified: Secondary | ICD-10-CM | POA: Diagnosis not present

## 2022-02-25 DIAGNOSIS — M25511 Pain in right shoulder: Secondary | ICD-10-CM

## 2022-02-25 DIAGNOSIS — Z09 Encounter for follow-up examination after completed treatment for conditions other than malignant neoplasm: Secondary | ICD-10-CM

## 2022-02-25 DIAGNOSIS — D72828 Other elevated white blood cell count: Secondary | ICD-10-CM

## 2022-02-25 DIAGNOSIS — M79672 Pain in left foot: Secondary | ICD-10-CM

## 2022-02-25 DIAGNOSIS — R1319 Other dysphagia: Secondary | ICD-10-CM | POA: Diagnosis not present

## 2022-02-25 DIAGNOSIS — R7989 Other specified abnormal findings of blood chemistry: Secondary | ICD-10-CM | POA: Diagnosis not present

## 2022-02-25 DIAGNOSIS — M79671 Pain in right foot: Secondary | ICD-10-CM

## 2022-02-25 LAB — CBC WITH DIFFERENTIAL/PLATELET
Basophils Absolute: 0.1 10*3/uL (ref 0.0–0.2)
Basos: 1 %
EOS (ABSOLUTE): 0.2 10*3/uL (ref 0.0–0.4)
Eos: 2 %
Hematocrit: 33.6 % — ABNORMAL LOW (ref 34.0–46.6)
Hemoglobin: 10.8 g/dL — ABNORMAL LOW (ref 11.1–15.9)
Immature Grans (Abs): 0 10*3/uL (ref 0.0–0.1)
Immature Granulocytes: 0 %
Lymphocytes Absolute: 1.4 10*3/uL (ref 0.7–3.1)
Lymphs: 13 %
MCH: 31 pg (ref 26.6–33.0)
MCHC: 32.1 g/dL (ref 31.5–35.7)
MCV: 97 fL (ref 79–97)
Monocytes Absolute: 1.1 10*3/uL — ABNORMAL HIGH (ref 0.1–0.9)
Monocytes: 10 %
Neutrophils Absolute: 7.9 10*3/uL — ABNORMAL HIGH (ref 1.4–7.0)
Neutrophils: 74 %
Platelets: 214 10*3/uL (ref 150–450)
RBC: 3.48 x10E6/uL — ABNORMAL LOW (ref 3.77–5.28)
RDW: 13.2 % (ref 11.7–15.4)
WBC: 10.6 10*3/uL (ref 3.4–10.8)

## 2022-02-25 MED ORDER — GABAPENTIN 100 MG PO CAPS: 100.0000 mg | ORAL_CAPSULE | Freq: Every day | ORAL | 2 refills | Status: DC

## 2022-02-25 NOTE — Progress Notes (Signed)
?[employer] as a Education administrator for [employer], FNP.,have documented all relevant documentation on the behalf of [name], FNP,as directed by  [name], FNP while in the presence of [name], [place].  ? ?This visit occurred during the SARS-CoV-2 public health emergency.  Safety protocols were in place, including screening questions prior to the visit, additional usage of staff PPE, and extensive cleaning of exam room while observing appropriate contact time as indicated for disinfecting solutions. ? ?Subjective:  ?  ? Patient ID: Jacqueline [name] , female    DOB: Jun 26, 1946 , 76 y.o.   MRN: [id number] ? ? ?Chief Complaint  ?Patient presents with  ? Hospitalization Follow-up  ? ? ?HPI ? ?The patient is here for a hospital f/u and evaluation of  swollen, numb feet. She has had several ER visits to include nausea and vomiting which has resolved. The patient states that she has lost her range of motion in her right arm after getting some vaccinations.  Felt like something in her chest area that is stuck and is not moving, was vomiting dark brown that looked like old blood. CT scan was normal. Her son reports she has more phlegm production.  ?  ? ?Past Medical History:  ?Diagnosis Date  ? Arthritis   ? "left knee" (12/04/2016)  ? CAD (coronary artery disease)   ? a. 12/2016 NSTEMI/PCI: LM nl, LaD 20p/d, D1 20, OM2 20, OM3 20, RCA 54m (3.0x18 Resolute DES). EF 65%.  ? Chronic lower back pain   ? Diverticulosis   ? on CT  ? ESRD on dialysis [facility])   ? "[facility]; [place]; TTS" (12/04/2016)  ? GERD (gastroesophageal reflux disease)   ? Hiatal hernia   ? Hyperlipidemia   ? Hypertension   ? a. 12/2016 labetalol d/c'd 2/2 hypotension in HD.  ? Morbid obesity ([place])   ? Obesity   ? Pneumonia   ? "couple times" (12/04/2016)  ? Renal insufficiency   ? Type II diabetes mellitus ([place])   ?  ? ?Family History  ?Problem Relation Age of Onset  ? Diabetes Mother   ? Peripheral vascular disease Mother   ?      amputation  ? CAD Father   ? Heart disease Father   ? Hypertension Father   ? Heart attack Father   ? CAD Sister   ? Cancer Brother   ?     x 2 brothers, type unknown  ? Breast cancer Sister   ? ? ? ?Current Outpatient Medications:  ?  acetaminophen (TYLENOL) 500 MG tablet, Take 1,000 mg by mouth every 6 (six) hours as needed for moderate pain or headache., Disp: , Rfl:  ?  aspirin EC 81 MG EC tablet, Take 1 tablet (81 mg total) by mouth daily., Disp: , Rfl:  ?  AURYXIA 1 GM 210 MG(Fe) tablet, Take 420 mg by mouth 2 (two) times a day., Disp: , Rfl:  ?  B Complex-C-Zn-Folic Acid (DIALYVITE/ZINC) TABS, Take 1 tablet by mouth daily., Disp: , Rfl:  ?  cinacalcet (SENSIPAR) 30 MG tablet, Take 30 mg by mouth daily., Disp: , Rfl:  ?  Doxercalciferol (HECTOROL IV), Dialysis Tuesday,Thursday and saturday, Disp: , Rfl:  ?  ezetimibe (ZETIA) 10 MG tablet, TAKE 1 TABLET BY MOUTH  DAILY  OVERDUE FOLLOW UP  APPOINTMENT NEEDED FOR  FURTHER REFILLS. (Patient taking differently: Take 10 mg by mouth daily.), Disp: 15 tablet, Rfl: 0 ?  gabapentin (NEURONTIN) 100 MG capsule, Take 1 capsule (  100 mg total) by mouth daily., Disp: 30 capsule, Rfl: 2 ?  metoCLOPramide (REGLAN) 10 MG tablet, TAKE 1 TABLET BY MOUTH  TWICE DAILY 1/2 HOUR BEFORE MEALS, Disp: 120 tablet, Rfl: 0 ?  midodrine (PROAMATINE) 10 MG tablet, Take 10 mg by mouth See admin instructions. Dialysis days Tues, Thurs Saturday, Disp: , Rfl:  ?  neomycin-polymyxin-hydrocortisone (CORTISPORIN) OTIC solution, 3 drops 4 (four) times daily., Disp: , Rfl:  ?  Omega-3 Fatty Acids (FISH OIL) 1000 MG CPDR, Take 1,000 mg by mouth daily. , Disp: , Rfl:  ?  omeprazole (PRILOSEC) 20 MG capsule, Take 1 capsule (20 mg total) by mouth daily., Disp: 90 capsule, Rfl: 1 ?  ondansetron (ZOFRAN) 4 MG tablet, Take 4 mg by mouth daily., Disp: , Rfl:  ?  rosuvastatin (CRESTOR) 40 MG tablet, Take 1 tablet (40 mg total) by mouth daily. Please schedule appointment for future refills. 3rd & FINAL  attempt!, Disp: 15 tablet, Rfl: 0 ?  Vitamin D, Ergocalciferol, 50 MCG (2000 UT) CAPS, Take 2,000 Units by mouth daily., Disp: , Rfl:  ?  albuterol (VENTOLIN HFA) 108 (90 Base) MCG/ACT inhaler, Inhale 1-2 puffs into the lungs every 6 (six) hours as needed for wheezing or shortness of breath. (Patient not taking: Reported on 02/25/2022), Disp: 1 each, Rfl: 0 ?  amoxicillin (AMOXIL) 500 MG tablet, Take 1 tablet (500 mg total) by mouth 2 (two) times daily., Disp: 14 tablet, Rfl: 0 ?  B Complex-C-Folic Acid (DIALYVITE 440) 0.8 MG TABS, Take 1 tablet by mouth daily., Disp: , Rfl:   ? ?Allergies  ?Allergen Reactions  ? Other Other (See Comments)  ? Sulfa Antibiotics Other (See Comments)  ? Sulfa Drugs Cross Reactors Other (See Comments)  ?  Doesn't remember   ?  ? ?Review of Systems  ?Constitutional: Negative.   ?Respiratory: Negative.    ?Cardiovascular:  Positive for chest pain (chest discomfort).  ?Gastrointestinal: Negative.  Negative for nausea and vomiting.  ?Musculoskeletal:  Positive for arthralgias (right shoulder pain).  ?Neurological: Negative.   ?Psychiatric/Behavioral: Negative.     ? ?Today's Vitals  ? 02/25/22 1246  ?BP: 116/74  ?Pulse: 83  ?Temp: 98 ?F (36.7 ?C)  ?Height: 5\' 5"  (1.651 m)  ?PainSc: 0-No pain  ? ?Body mass index is 41.44 kg/m?.  ? ?Objective:  ?Physical Exam ?Vitals reviewed.  ?Constitutional:   ?   General: She is not in acute distress. ?   Appearance: Normal appearance. She is obese.  ?HENT:  ?   Head: Normocephalic.  ?Cardiovascular:  ?   Rate and Rhythm: Normal rate and regular rhythm.  ?   Pulses: Normal pulses.  ?   Heart sounds: Normal heart sounds. No murmur heard. ?Pulmonary:  ?   Effort: Pulmonary effort is normal. No respiratory distress.  ?   Breath sounds: Normal breath sounds. No wheezing.  ?Musculoskeletal:     ?   General: Tenderness (left lateral knee pain) present.  ?Skin: ?   General: Skin is warm and dry.  ?   Capillary Refill: Capillary refill takes less than 2 seconds.   ?Neurological:  ?   General: No focal deficit present.  ?   Mental Status: She is alert and oriented to person, place, and time.  ?   Cranial Nerves: No cranial nerve deficit.  ?   Motor: No weakness.  ?Psychiatric:     ?   Mood and Affect: Mood normal.     ?   Behavior: Behavior normal.     ?  Thought Content: Thought content normal.     ?   Judgment: Judgment normal.  ?  ? ?   ?Assessment And Plan:  ?   ?1. Other elevated white blood cell (WBC) count ?Comments: She had nausea and vomiting on 02/12/2022 and was seen in the ER. She is no longer having nausea or vomiting, had an elevated WBC will recheck ?- CBC with Diff ? ?2. Elevated brain natriuretic peptide (BNP) level ?Comments: During one of her ER visits had elevated BNP, will recheck. Trace edema at this time ?- Brain natriuretic peptide ? ?3. Chronic congestive heart failure, unspecified heart failure type ([place]) ?- Ambulatory referral to Cardiology ?- AMB Referral to [place] ? ?4. Other dysphagia ?Comments: She has a GI but will refer to Cardiology since she feels like having some chest discomfort.  ?- AMB Referral to [place] ? ?5. Acute pain of right shoulder ?Comments: Will order PT for right shoulder pain, she does have some decreased ROM of right shoulder. Encouraged to used pain cream ?- Ambulatory referral to [place] ? ?6. Pain in both feet ? ?7. Left ear pain ?Comments: she was treated for ear infection 2 weeks ago and oral medications, will refer to ENT.  ?- Ambulatory referral to ENT ?  ? ? ?Patient was given opportunity to ask questions. Patient verbalized understanding of the plan and was able to repeat key elements of the plan. All questions were answered to their satisfaction.  ?[name], FNP  ? ?I, [name], FNP, have reviewed all documentation for this visit. The documentation on 02/25/22 for the exam, diagnosis, procedures, and orders are all accurate and complete.  ? ?IF YOU HAVE BEEN  REFERRED TO A SPECIALIST, IT MAY TAKE 1-2 WEEKS TO SCHEDULE/PROCESS THE REFERRAL. IF YOU HAVE NOT HEARD FROM US/SPECIALIST IN TWO WEEKS, PLEASE GIVE [place] A CALL AT [phone].  ? ?THE PATIENT IS ENCOURAGED TO PRACTIC

## 2022-02-25 NOTE — Patient Instructions (Addendum)
Dr. Fransico Him - Cardiology ?Address: [address], [place], [place] [address] ? ?Phone: [phone] ? ?Referrals made to Cardiology, ENT ?

## 2022-02-26 ENCOUNTER — Other Ambulatory Visit: Payer: Self-pay | Admitting: Nurse Practitioner

## 2022-02-26 ENCOUNTER — Telehealth: Payer: Self-pay | Admitting: *Deleted

## 2022-02-26 LAB — BRAIN NATRIURETIC PEPTIDE: BNP: 157.2 pg/mL — ABNORMAL HIGH (ref 0.0–100.0)

## 2022-02-26 MED ORDER — AMOXICILLIN 500 MG PO TABS: 500.0000 mg | ORAL_TABLET | Freq: Two times a day (BID) | ORAL | 0 refills | Status: DC

## 2022-02-26 NOTE — Chronic Care Management (AMB) (Signed)
?  Chronic Care Management  ? ?Outreach Note ? ?02/26/2022 ?Name: Jacqueline [name] MRN: [id number] DOB: December 05, 1945 ? ?Jacqueline [name] is a 76 y.o. year old female who is a primary care patient of [name], [place]. I reached out to [name] by phone today in response to a referral sent by Ms. Clarisa Schools primary care provider. ? ?An unsuccessful telephone outreach was attempted today. The patient was referred to the case management team for assistance with care management and care coordination.  ? ?Follow Up Plan: A HIPAA compliant phone message was left for the patient providing contact information and requesting a return call.  ?The care management team will reach out to the patient again over the next 7 days.  ?If patient returns call to provider office, please advise to call [place]* at [phone].* ? ?Laverda Sorenson  ?Care Guide, Embedded Care Coordination ?  Care Management  ?Direct Dial: [phone] ? ?

## 2022-02-28 NOTE — Chronic Care Management (AMB) (Signed)
?  Care Management  ? ?Outreach Note ? ?02/28/2022 ?Name: Jacqueline [name] MRN: [id number] DOB: [birth year] ? ?Referred by: [name], FNP ?Reason for referral : Chronic Care Management (Outreach to schedule with RNCM, BSW, and PharmD) ? ? ?A second unsuccessful telephone outreach was attempted today. The patient was referred to the case management team for assistance with care management and care coordination.  ? ?Follow Up Plan:  ?A HIPAA compliant phone message was left for the patient providing contact information and requesting a return call.  ?The care management team will reach out to the patient again over the next 7 days.  ?If patient returns call to provider office, please advise to call [place]* at [phone].* ? ?Laverda Sorenson  ?Care Guide, Embedded Care Coordination ?[place]  Care Management  ?Direct Dial: [phone]  ?

## 2022-03-01 NOTE — Chronic Care Management (AMB) (Signed)
?  Care Management  ? ?Note ? ?03/01/2022 ?Name: ZIAIRE BIESER MRN: [id number] DOB: October 04, 1946 ? ?NAYELIE GIONFRIDDO is a 76 y.o. year old female who is a primary care patient of [name], [place]. I reached out to [name] by phone today offer care coordination services.  ? ?[name] was given information about care management services today including:  ?Care management services include personalized support from designated clinical staff supervised by her physician, including individualized plan of care and coordination with other care providers ?24/7 contact phone numbers for assistance for urgent and routine care needs. ?The patient may stop care management services at any time by phone call to the office staff. ? ?Patient agreed to services and verbal consent obtained.  ? ?Follow up plan: ?Telephone appointment with care management team member scheduled for:03/06/22 on BSW, and 03/27/22 with PharmD ? ?Laverda Sorenson  ?Care Guide, Embedded Care Coordination ?[place]  Care Management  ?Direct Dial: [phone] ? ?

## 2022-03-03 ENCOUNTER — Other Ambulatory Visit: Payer: Self-pay

## 2022-03-03 ENCOUNTER — Emergency Department
Admission: EM | Admit: 2022-03-03 | Discharge: 2022-03-03 | Disposition: A | Discharge status: Home / Self Care | Payer: Medicare Other | Attending: Emergency Medicine | Admitting: Emergency Medicine

## 2022-03-03 ENCOUNTER — Emergency Department: Payer: Medicare Other

## 2022-03-03 DIAGNOSIS — Z992 Dependence on renal dialysis: Secondary | ICD-10-CM | POA: Diagnosis not present

## 2022-03-03 DIAGNOSIS — I2510 Atherosclerotic heart disease of native coronary artery without angina pectoris: Secondary | ICD-10-CM | POA: Diagnosis not present

## 2022-03-03 DIAGNOSIS — E1122 Type 2 diabetes mellitus with diabetic chronic kidney disease: Secondary | ICD-10-CM | POA: Insufficient documentation

## 2022-03-03 DIAGNOSIS — N186 End stage renal disease: Secondary | ICD-10-CM | POA: Insufficient documentation

## 2022-03-03 DIAGNOSIS — K5900 Constipation, unspecified: Secondary | ICD-10-CM | POA: Diagnosis present

## 2022-03-03 NOTE — Discharge Instructions (Signed)
Take the MiraLax daily  ?Follow up with primary care if constipation becomes frequent. ? ?

## 2022-03-03 NOTE — ED Notes (Signed)
Sup soads peformed ?

## 2022-03-03 NOTE — ED Provider Notes (Signed)
? ?[facility] ?Emergency Department Provider Note ?____________________________________________ ? ? Event Date/Time  ? First MD Initiated Contact with Patient 03/03/22 1934   ?  (approximate) ? ?I have reviewed the triage vital signs and the nursing notes. ? ? ?HISTORY ? ?Chief Complaint ?Constipation ? ?HPI ?Jacqueline [name] is a 76 y.o. female with history as listed below presents to the emergency department for treatment and evaluation of constipation. Last BM was 4 days ago. Requesting enema. ? ?Past Medical History:  ?Diagnosis Date  ? Arthritis   ? "left knee" (12/04/2016)  ? CAD (coronary artery disease)   ? a. 12/2016 NSTEMI/PCI: LM nl, LaD 20p/d, D1 20, OM2 20, OM3 20, RCA 32m (3.0x18 Resolute DES). EF 65%.  ? Chronic lower back pain   ? Diverticulosis   ? on CT  ? ESRD on dialysis [facility])   ? "[facility]; [place]; TTS" (12/04/2016)  ? GERD (gastroesophageal reflux disease)   ? Hiatal hernia   ? Hyperlipidemia   ? Hypertension   ? a. 12/2016 labetalol d/c'd 2/2 hypotension in HD.  ? Morbid obesity ([place])   ? Obesity   ? Pneumonia   ? "couple times" (12/04/2016)  ? Renal insufficiency   ? Type II diabetes mellitus ([place])   ? ? ?Allergies ?Other, Sulfa antibiotics, and Sulfa drugs cross reactors ?____________________________________________ ? ? ?PHYSICAL EXAM: ? ?VITAL SIGNS: ?ED Triage Vitals  ?Enc Vitals Group  ?   BP 03/03/22 1848 (!) 91/48  ?   Pulse Rate 03/03/22 1848 79  ?   Resp 03/03/22 1848 18  ?   Temp 03/03/22 1848 98.8 ?F (37.1 ?C)  ?   Temp Source 03/03/22 1848 Oral  ?   SpO2 03/03/22 1848 93 %  ?   Weight 03/03/22 1850 259 lb (117.5 kg)  ?   Height 03/03/22 1850 5\' 4"  (1.626 m)  ?   Head Circumference --   ?   Peak Flow --   ?   Pain Score 03/03/22 1850 0  ?   Pain Loc --   ?   Pain Edu? --   ?   Excl. in [place]? --   ? ? ?Constitutional: Alert and oriented. Well appearing and in no acute distress. ?Respiratory: Normal respiratory effort.  No retractions.  ?Gastrointestinal:  Soft and nontender. No distention. ?Musculoskeletal: No lower extremity tenderness nor edema.  No joint effusions. ?Neurologic:  Normal speech and language. No gross focal neurologic deficits are appreciated. No gait instability. ?Skin:  Skin is warm, dry and intact. No rash noted. ?Psychiatric: Mood and affect are normal. Speech and behavior are normal. ? ?____________________________________________ ?  ?LABS ?(all labs ordered are listed, but only abnormal results are displayed) ? ?Labs Reviewed - No data to display ?____________________________________________ ? ?EKG ? ?Not indicated. ?____________________________________________ ? ?RADIOLOGY ? ?ED MD interpretation:   ? ?Moderate stool burden. ? ?I, Sherrie George, personally viewed and evaluated these images (plain radiographs) as part of my medical decision making, as well as reviewing the written report by the radiologist. ?____________________________________________ ? ? ?PROCEDURES ? ?Procedure(s) performed (including Critical Care): ? ?Procedures ? ?____________________________________________ ? ? ?INITIAL IMPRESSION / ASSESSMENT AND PLAN  ? ?  ?76 year old female presenting to the ER for constipation. See HPI. ? ?DIFFERENTIAL DIAGNOSIS ? ?Constipation, small bowel obstruction ? ?ED COURSE ? ?Soap suds enema given with good results. Patient feels better and ready for discharge. She was encouraged to take Miralax daily. She is to follow up with  primary care if constipation becomes recurrent. ? ?  ?___________________________________________ ? ? ?FINAL CLINICAL IMPRESSION(S) / ED DIAGNOSES ? ?Final diagnoses:  ?Constipation, unspecified constipation type  ? ? ? ?ED Discharge Orders   ? ? None  ? ?  ? ? ?Note:  This document was prepared using Dragon voice recognition software and may include unintentional dictation errors. ? ?  ?Victorino Dike, FNP ?03/07/22 0759 ? ?  ?Harvest Dark, MD ?03/07/22 1311 ? ?

## 2022-03-03 NOTE — ED Triage Notes (Signed)
Pt states she is having constipation- pt states her last normal BM was 4 days ago- pt states this has happened before and she had to have an enema ?

## 2022-03-03 NOTE — ED Notes (Signed)
Pt discharge information reviewed. Pt understands need for follow up care and when to return if symptoms worsen. All questions answered. Pt is alert and oriented with even and regular respirations. Pt is brought out of department in wheelchair by son.  ?

## 2022-03-05 ENCOUNTER — Telehealth: Payer: Self-pay

## 2022-03-05 ENCOUNTER — Encounter: Payer: BC Managed Care – PPO | Admitting: Nurse Practitioner

## 2022-03-05 NOTE — Telephone Encounter (Signed)
Transition Care Management Unsuccessful Follow-up Telephone Call ? ?Date of discharge and from where:  03/03/2022 ? ?Attempts:  1st Attempt ? ?Reason for unsuccessful TCM follow-up call:  Left voice message ? ?  ?

## 2022-03-05 NOTE — Telephone Encounter (Signed)
Pt declined the offer for an appointment after visiting ED on 03/03/2022. She states she does not want an appointment at this time, she is fine.  ?

## 2022-03-06 ENCOUNTER — Telehealth: Payer: Medicare Other

## 2022-03-06 ENCOUNTER — Telehealth: Payer: Self-pay

## 2022-03-06 NOTE — Telephone Encounter (Signed)
?  Care Management  ? ?Follow Up Note ? ? ?03/06/2022 ?Name: Jacqueline [name] MRN: [id number] DOB: January 05, 1946 ? ? ?Referred by: [name], FNP ?Reason for referral : Care Coordination (Unsuccessful call) ? ? ?An unsuccessful telephone outreach was attempted today. The patient was referred to the case management team for assistance with care management and care coordination.  ? ?Follow Up Plan: The care management team will reach out to the patient again over the next 30 days.  ? ?Daneen Schick, BSW, CDP ?Social Worker, Certified Dementia Practitioner ?[facility] / [facility] ?[phone] ? ?   ? ? ?

## 2022-03-15 ENCOUNTER — Telehealth: Payer: Self-pay | Admitting: Cardiology

## 2022-03-15 NOTE — Telephone Encounter (Signed)
Per son, pt would like to switch care from Dr. Radford Pax to Dr. Ellyn Hack because she is currently living in [place].  ?

## 2022-03-16 NOTE — Telephone Encounter (Signed)
I am only in [place] for equivalent of a half a day once a week.  It is fine if she wants to switch to me but it would make more sense to switch to one of the more permanent providers out there. ? ?Glenetta Hew, MD ? ?

## 2022-03-20 ENCOUNTER — Other Ambulatory Visit: Payer: Self-pay | Admitting: Podiatry

## 2022-03-20 DIAGNOSIS — R0989 Other specified symptoms and signs involving the circulatory and respiratory systems: Secondary | ICD-10-CM

## 2022-03-20 DIAGNOSIS — M79672 Pain in left foot: Secondary | ICD-10-CM

## 2022-03-22 ENCOUNTER — Telehealth: Payer: Self-pay

## 2022-03-22 NOTE — Chronic Care Management (AMB) (Addendum)
? ? ? ? ? ?Chronic Care Management ?Pharmacy Assistant  ? ?Name: Jacqueline [name]  MRN: [id number] DOB: 11-24-1946 ? ?Reason for Encounter: Chart review for CPP visit on 03-26-2022 ?  ?Conditions to be addressed/monitored: ?CHF, HTN, HLD, DMII, and ESRD ? ?Recent office visits:  ?03-26-2022 [name], [place]. STOP amoxicillin.  ? ?02-25-2022 [name], [place]. Referrals placed to ENT, Cardiology and home health. BNP= 157.2. RBC= 3.48, Hemo= 10.8, Hema=33.6, Neutrophils Absolute= 7.9, Monocytes Absolute= 1.1. STOP guaifenesin and omeprazole. START gabapentin 1000 mg daily. ? ?10-10-2021 [name], [place]. Glucose= 194, BUN= 29, Creatinine= 7.94, eGFR= 5, BUN/Creatinine= 4, Potassium= 3.4, Chloride= 93, Alkaline Phosphatase= 145. A1C= 6.9. Kenelog injection given ? ?Recent consult visits:  ?02-18-2022 Gardiner Barefoot, DPM (Podiatry). ?Mechanical debridement of nails 1-5 bilaterally performed with a nail nipper.  Filed with dremel without incident. Follow up in 4 months. ? ?01-04-2022 Little, Olevia Bowens, MD (Orthotics). Follow up on diabetic shoes. ? ?10-31-2021 Edrick Oh, MD (Nephrology). Unable to view encounter. ? ?10-01-2021 Donato Heinz, MD (Nephrology). Unable to view encounter. ? ? ?Hospital visits:  ?Medication Reconciliation was completed by comparing discharge summary, patient?s EMR and Pharmacy list, and upon discussion with patient. ? ?Admitted to the hospital on 03-03-2022 due to Constipation. Discharge date was 03-03-2022. Discharged from [facility].   ? ?New?Medications Started at [facility] Discharge:?? ?Recommended taking miralax daily ? ?Medication Changes at Hospital Discharge: ?None ? ?Medications Discontinued at Hospital Discharge: ?None ? ?Medications that remain the same after Hospital Discharge:??  ?-All other medications will remain the same.  ? ?Hospital visits:  ?Medication Reconciliation was completed by comparing discharge summary, patient?s EMR and Pharmacy list, and upon  discussion with patient. ? ?Admitted to the hospital on 02-12-2022 due to Nausea/vomiting. Discharge date was 02-12-2022. Discharged from [facility].   ? ?New?Medications Started at [facility] Discharge:?? ?Zofran 4 mg daily PRN ? ?Medication Changes at Hospital Discharge: ?None ? ?Medications Discontinued at Hospital Discharge: ?None ? ?Medications that remain the same after Hospital Discharge:??  ?-All other medications will remain the same.  ? ?Hospital visits:  ?Medication Reconciliation was completed by comparing discharge summary, patient?s EMR and Pharmacy list, and upon discussion with patient. ? ?Admitted to the hospital on 01-04-2022 due to Acute otitis externa of left ear. Discharge date was 01-04-2022.  Discharged from  [facility] health urgent care ? ?New?Medications Started at [facility] Discharge:?? ?CORTISPORIN place 3 drops in left ear 4 times daily for 7 days ? ?Medication Changes at Hospital Discharge: ?None ? ?Medications Discontinued at Hospital Discharge: ?Ibuprofen ? ?Medications that remain the same after Hospital Discharge:??  ?-All other medications will remain the same. ? ?Hospital visits:  ?Medication Reconciliation was completed by comparing discharge summary, patient?s EMR and Pharmacy list, and upon discussion with patient. ? ?Admitted to the hospital on 12-14-2021 due to Covid. Discharge date was 12-15-2021. Discharged from [facility].   ? ?New?Medications Started at [facility] Discharge:?? ?Albuterol inhaler 1-2 puffs every 6 hours as needed ?Guaifenesin 10 mLs every 4 hours PRN ?Ibuprofen 600 mg every 6 hours PRN ? ?Medication Changes at Hospital Discharge: ?None ? ?Medications Discontinued at Hospital Discharge: ?None ? ?Medications that remain the same after Hospital Discharge:??  ?-All other medications will remain the same. ? ?Hospital visits:  ?Medication Reconciliation was completed by comparing discharge summary, patient?s EMR and Pharmacy list, and upon discussion with  patient. ? ?Admitted to the hospital on 12-06-2021 due to Covid. Discharge date was 12-07-2021. Discharged from [facility].   ? ?  New?Medications Started at [facility] Discharge:?? ?Guaifenesin 5-10 mLs every 4 hours PRN ?Ibuprofen 600 mg every 6 hours PRN ? ?Medication Changes at Hospital Discharge: ?None ? ?Medications Discontinued at Hospital Discharge: ?None ? ?Medications that remain the same after Hospital Discharge:??  ?-All other medications will remain the same.   ? ?Medications: ?Outpatient Encounter Medications as of 03/22/2022  ?Medication Sig  ? acetaminophen (TYLENOL) 500 MG tablet Take 1,000 mg by mouth every 6 (six) hours as needed for moderate pain or headache.  ? albuterol (VENTOLIN HFA) 108 (90 Base) MCG/ACT inhaler Inhale 1-2 puffs into the lungs every 6 (six) hours as needed for wheezing or shortness of breath.  ? aspirin EC 81 MG EC tablet Take 1 tablet (81 mg total) by mouth daily.  ? AURYXIA 1 GM 210 MG(Fe) tablet Take 420 mg by mouth 2 (two) times a day.  ? B Complex-C-Folic Acid (DIALYVITE 374) 0.8 MG TABS Take 1 tablet by mouth daily.  ? B Complex-C-Zn-Folic Acid (DIALYVITE/ZINC) TABS Take 1 tablet by mouth daily.  ? cinacalcet (SENSIPAR) 30 MG tablet Take 30 mg by mouth daily.  ? Doxercalciferol (HECTOROL IV) Dialysis Tuesday,Thursday and saturday  ? ezetimibe (ZETIA) 10 MG tablet TAKE 1 TABLET BY MOUTH  DAILY  OVERDUE FOLLOW UP  APPOINTMENT NEEDED FOR  FURTHER REFILLS. (Patient taking differently: Take 10 mg by mouth daily.)  ? gabapentin (NEURONTIN) 100 MG capsule Take 1 capsule (100 mg total) by mouth daily.  ? metoCLOPramide (REGLAN) 10 MG tablet TAKE 1 TABLET BY MOUTH  TWICE DAILY 1/2 HOUR BEFORE MEALS  ? midodrine (PROAMATINE) 10 MG tablet Take 10 mg by mouth See admin instructions. Dialysis days [place] Saturday  ? neomycin-polymyxin-hydrocortisone (CORTISPORIN) OTIC solution 3 drops 4 (four) times daily.  ? Omega-3 Fatty Acids (FISH OIL) 1000 MG CPDR Take 1,000 mg by  mouth daily.   ? omeprazole (PRILOSEC) 20 MG capsule Take 1 capsule (20 mg total) by mouth daily.  ? ondansetron (ZOFRAN) 4 MG tablet Take 4 mg by mouth daily.  ? rosuvastatin (CRESTOR) 40 MG tablet Take 1 tablet (40 mg total) by mouth daily. Please schedule appointment for future refills. 3rd & FINAL attempt!  ? Vitamin D, Ergocalciferol, 50 MCG (2000 UT) CAPS Take 2,000 Units by mouth daily.  ? [DISCONTINUED] amoxicillin (AMOXIL) 500 MG tablet Take 1 tablet (500 mg total) by mouth 2 (two) times daily.  ? ?No facility-administered encounter medications on file as of 03/22/2022.  ? ?Lab Results  ?Component Value Date/Time  ? HGBA1C 6.9 (H) 10/10/2021 11:09 AM  ? HGBA1C 6.6 06/12/2021 12:00 AM  ? HGBA1C 7.3 (H) 02/28/2021 10:41 AM  ?  ? ?BP Readings from Last 3 Encounters:  ?03/25/22 110/64  ?03/03/22 (!) 95/50  ?02/25/22 116/74  ? ? ? ?Care Gaps: ?Last annual wellness visit? 06-27-2021 ?Last Colonoscopy? None ?Last Mammogram? 11-22-2019 ?Last Bone [place]? 07-30-2017 ? ? ?03-22-2022: 1st attempt left VM ?03-25-2022: 2nd attempt left VM ?03-26-2022: 3rd attempt left VM ? ?Immunizations: ?Shingrix overdue ?Yearly ophthalmology exam overdue ?Covid booster overdue ? ?Star Rating Drugs: ?Rosuvastatin 40 mg- Last filled 05-07-2021 15 DS [facility] ? ?Malecca Hicks CMA ?Clinical Pharmacist Assistant ?[phone] ? ? ?

## 2022-03-25 ENCOUNTER — Encounter: Payer: Self-pay | Admitting: Nurse Practitioner

## 2022-03-25 ENCOUNTER — Ambulatory Visit (INDEPENDENT_AMBULATORY_CARE_PROVIDER_SITE_OTHER): Payer: Medicare Other | Admitting: Nurse Practitioner

## 2022-03-25 VITALS — BP 110/64 | HR 64 | Temp 98.6°F | Ht 64.0 in | Wt 238.0 lb

## 2022-03-25 DIAGNOSIS — Z992 Dependence on renal dialysis: Secondary | ICD-10-CM

## 2022-03-25 DIAGNOSIS — Z741 Need for assistance with personal care: Secondary | ICD-10-CM

## 2022-03-25 DIAGNOSIS — N186 End stage renal disease: Secondary | ICD-10-CM

## 2022-03-25 DIAGNOSIS — I130 Hypertensive heart and chronic kidney disease with heart failure and stage 1 through stage 4 chronic kidney disease, or unspecified chronic kidney disease: Secondary | ICD-10-CM | POA: Diagnosis not present

## 2022-03-25 DIAGNOSIS — E1122 Type 2 diabetes mellitus with diabetic chronic kidney disease: Secondary | ICD-10-CM | POA: Diagnosis not present

## 2022-03-25 NOTE — Progress Notes (Signed)
?Industrial/product designer as a Education administrator for [employer], FNP.,have documented all relevant documentation on the behalf of [name], FNP,as directed by  [name], FNP while in the presence of [name], [place]. ? ?This visit occurred during the SARS-CoV-2 public health emergency.  Safety protocols were in place, including screening questions prior to the visit, additional usage of staff PPE, and extensive cleaning of exam room while observing appropriate contact time as indicated for disinfecting solutions. ? ?Subjective:  ?  ? Patient ID: Jacqueline [name] , female    DOB: 01-03-1946 , 76 y.o.   MRN: [id number] ? ? ?Chief Complaint  ?Patient presents with  ? Diabetes  ? Hypertension  ? ? ?HPI ? ?Here for blood pressure and diabetes f/u. She is having foot pain. Feels like burning with her toes and her feet.  When she keeps them down on the floor they don't hurt as bad but legs swelling. She has been sleeping in recliner and when sleeping in lower bed she felt like she could not breath. The physical therapist and social worker has been out to see her. ? ?Her son Lester Kinsman is with her today and he has concerns with her not being able to care for herself. He is concerned about her safety and noone is there with her to help get her up.  During these hours he does not want her to be at home.  ? ?Diabetes ?She presents for her follow-up diabetic visit. She has type 2 diabetes mellitus. There are no hypoglycemic associated symptoms. There are no diabetic associated symptoms. Pertinent negatives for diabetes include no polydipsia, no polyphagia and no polyuria. There are no hypoglycemic complications. There are no diabetic complications.  ?Hypertension ?This is a chronic problem. The current episode started more than 1 year ago. The problem is unchanged. The problem is controlled. Pertinent negatives include no anxiety. There are no associated agents to hypertension. Risk factors for coronary artery disease include  obesity. Past treatments include diuretics. Compliance problems include exercise.  There is no history of angina. There is no history of chronic renal disease.   ? ?Past Medical History:  ?Diagnosis Date  ? Arthritis   ? "left knee" (12/04/2016)  ? CAD (coronary artery disease)   ? a. 12/2016 NSTEMI/PCI: LM nl, LaD 20p/d, D1 20, OM2 20, OM3 20, RCA 50m (3.0x18 Resolute DES). EF 65%.  ? Chronic lower back pain   ? Diverticulosis   ? on CT  ? ESRD on dialysis [facility])   ? "[facility]; [place]; TTS" (12/04/2016)  ? GERD (gastroesophageal reflux disease)   ? Hiatal hernia   ? Hyperlipidemia   ? Hypertension   ? a. 12/2016 labetalol d/c'd 2/2 hypotension in HD.  ? Morbid obesity ([place])   ? Obesity   ? Pneumonia   ? "couple times" (12/04/2016)  ? Renal insufficiency   ? Type II diabetes mellitus ([place])   ?  ? ?Family History  ?Problem Relation Age of Onset  ? Diabetes Mother   ? Peripheral vascular disease Mother   ?     amputation  ? CAD Father   ? Heart disease Father   ? Hypertension Father   ? Heart attack Father   ? CAD Sister   ? Cancer Brother   ?     x 2 brothers, type unknown  ? Breast cancer Sister   ? ? ? ?Current Outpatient Medications:  ?  acetaminophen (TYLENOL) 500 MG tablet, Take 1,000 mg by mouth every  6 (six) hours as needed for moderate pain or headache., Disp: , Rfl:  ?  albuterol (VENTOLIN HFA) 108 (90 Base) MCG/ACT inhaler, Inhale 1-2 puffs into the lungs every 6 (six) hours as needed for wheezing or shortness of breath., Disp: 1 each, Rfl: 0 ?  aspirin EC 81 MG EC tablet, Take 1 tablet (81 mg total) by mouth daily., Disp: , Rfl:  ?  AURYXIA 1 GM 210 MG(Fe) tablet, Take 420 mg by mouth 2 (two) times a day., Disp: , Rfl:  ?  B Complex-C-Folic Acid (DIALYVITE 275) 0.8 MG TABS, Take 1 tablet by mouth daily., Disp: , Rfl:  ?  B Complex-C-Zn-Folic Acid (DIALYVITE/ZINC) TABS, Take 1 tablet by mouth daily., Disp: , Rfl:  ?  Doxercalciferol (HECTOROL IV), Dialysis Tuesday,Thursday and saturday, Disp: , Rfl:   ?  ezetimibe (ZETIA) 10 MG tablet, TAKE 1 TABLET BY MOUTH  DAILY  OVERDUE FOLLOW UP  APPOINTMENT NEEDED FOR  FURTHER REFILLS. (Patient taking differently: Take 10 mg by mouth daily.), Disp: 15 tablet, Rfl: 0 ?  gabapentin (NEURONTIN) 100 MG capsule, Take 1 capsule (100 mg total) by mouth daily., Disp: 30 capsule, Rfl: 2 ?  metoCLOPramide (REGLAN) 10 MG tablet, TAKE 1 TABLET BY MOUTH  TWICE DAILY 1/2 HOUR BEFORE MEALS, Disp: 120 tablet, Rfl: 0 ?  midodrine (PROAMATINE) 10 MG tablet, Take 10 mg by mouth See admin instructions. Dialysis days Tues, Thurs Saturday, Disp: , Rfl:  ?  neomycin-polymyxin-hydrocortisone (CORTISPORIN) OTIC solution, 3 drops 4 (four) times daily., Disp: , Rfl:  ?  Omega-3 Fatty Acids (FISH OIL) 1000 MG CPDR, Take 1,000 mg by mouth daily. , Disp: , Rfl:  ?  omeprazole (PRILOSEC) 20 MG capsule, Take 1 capsule (20 mg total) by mouth daily., Disp: 90 capsule, Rfl: 1 ?  ondansetron (ZOFRAN) 4 MG tablet, Take 4 mg by mouth daily., Disp: , Rfl:  ?  rosuvastatin (CRESTOR) 40 MG tablet, Take 1 tablet (40 mg total) by mouth daily. Please schedule appointment for future refills. 3rd & FINAL attempt!, Disp: 15 tablet, Rfl: 0 ?  Vitamin D, Ergocalciferol, 50 MCG (2000 UT) CAPS, Take 2,000 Units by mouth daily., Disp: , Rfl:  ?  cinacalcet (SENSIPAR) 30 MG tablet, Take 30 mg by mouth daily., Disp: , Rfl:   ? ?Allergies  ?Allergen Reactions  ? Other Other (See Comments)  ? Sulfa Antibiotics Other (See Comments)  ? Sulfa Drugs Cross Reactors Other (See Comments)  ?  Doesn't remember   ?  ? ?Review of Systems  ?Constitutional: Negative.   ?Respiratory: Negative.    ?Cardiovascular: Negative.   ?Gastrointestinal: Negative.   ?Endocrine: Negative for polydipsia, polyphagia and polyuria.  ?Neurological: Negative.    ? ?Today's Vitals  ? 03/25/22 0923  ?BP: 110/64  ?Pulse: 64  ?Temp: 98.6 ?F (37 ?C)  ?TempSrc: Oral  ?Weight: 238 lb (108 kg)  ?Height: 5\' 4"  (1.626 m)  ? ?Body mass index is 40.85 kg/m?.   ? ?Objective:  ?Physical Exam ?Vitals reviewed.  ?Constitutional:   ?   General: She is not in acute distress. ?   Appearance: Normal appearance. She is obese.  ?HENT:  ?   Head: Normocephalic.  ?Cardiovascular:  ?   Rate and Rhythm: Normal rate and regular rhythm.  ?   Pulses: Normal pulses.  ?   Heart sounds: Normal heart sounds. No murmur heard. ?Pulmonary:  ?   Effort: Pulmonary effort is normal. No respiratory distress.  ?   Breath sounds: Normal breath  sounds. No wheezing.  ?Musculoskeletal:     ?   General: Tenderness (left lateral knee pain) present.  ?Skin: ?   General: Skin is warm and dry.  ?   Capillary Refill: Capillary refill takes less than 2 seconds.  ?Neurological:  ?   General: No focal deficit present.  ?   Mental Status: She is alert and oriented to person, place, and time.  ?   Cranial Nerves: No cranial nerve deficit.  ?   Motor: No weakness.  ?Psychiatric:     ?   Mood and Affect: Mood normal.     ?   Behavior: Behavior normal.     ?   Thought Content: Thought content normal.     ?   Judgment: Judgment normal.  ?  ? ?   ?Assessment And Plan:  ?   ?1. Type 2 diabetes mellitus with end-stage renal disease ([place]) ?Comments: Her HgbA1c is slightly elevated but I am concerned she can not administer Insulin herself. Will try to get her set up with [employer] work to assist with placem ?- AMB Referral to [place] ? ?2. 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 ?3. Dependence on renal dialysis ([place]) ? ?4. Need for assistance with personal care ?Long discussion with her son [name] and patient being able to care for herself. He would like to speak with a SW to see about placement ? ? ?Patient was given opportunity to ask questions. Patient verbalized understanding of the plan and was able to repeat key elements of the  plan. All questions were answered to their satisfaction.  ?[name], FNP  ? ?I, [name], FNP, have reviewed all documentation for this visit. The documentation on 03/25/22 for the exam, diagnosis, procedures, and

## 2022-03-25 NOTE — Patient Instructions (Signed)
Diabetes Mellitus and Foot Care Foot care is an important part of your health, especially when you have diabetes. Diabetes may cause you to have problems because of poor blood flow (circulation) to your feet and legs, which can cause your skin to: Become thinner and drier. Break more easily. Heal more slowly. Peel and crack. You may also have nerve damage (neuropathy) in your legs and feet, causing decreased feeling in them. This means that you may not notice minor injuries to your feet that could lead to more serious problems. Noticing and addressing any potential problems early is the best way to prevent future foot problems. How to care for your feet Foot hygiene  Wash your feet daily with warm water and mild soap. Do not use hot water. Then, pat your feet and the areas between your toes until they are completely dry. Do not soak your feet as this can dry your skin. Trim your toenails straight across. Do not dig under them or around the cuticle. File the edges of your nails with an emery board or nail file. Apply a moisturizing lotion or petroleum jelly to the skin on your feet and to dry, brittle toenails. Use lotion that does not contain alcohol and is unscented. Do not apply lotion between your toes. Shoes and socks Wear clean socks or stockings every day. Make sure they are not too tight. Do not wear knee-high stockings since they may decrease blood flow to your legs. Wear shoes that fit properly and have enough cushioning. Always look in your shoes before you put them on to be sure there are no objects inside. To break in new shoes, wear them for just a few hours a day. This prevents injuries on your feet. Wounds, scrapes, corns, and calluses  Check your feet daily for blisters, cuts, bruises, sores, and redness. If you cannot see the bottom of your feet, use a mirror or ask someone for help. Do not cut corns or calluses or try to remove them with medicine. If you find a minor scrape,  cut, or break in the skin on your feet, keep it and the skin around it clean and dry. You may clean these areas with mild soap and water. Do not clean the area with peroxide, alcohol, or iodine. If you have a wound, scrape, corn, or callus on your foot, look at it several times a day to make sure it is healing and not infected. Check for: Redness, swelling, or pain. Fluid or blood. Warmth. Pus or a bad smell. General tips Do not cross your legs. This may decrease blood flow to your feet. Do not use heating pads or hot water bottles on your feet. They may burn your skin. If you have lost feeling in your feet or legs, you may not know this is happening until it is too late. Protect your feet from hot and cold by wearing shoes, such as at the beach or on hot pavement. Schedule a complete foot exam at least once a year (annually) or more often if you have foot problems. Report any cuts, sores, or bruises to your health care provider immediately. Where to find more information American Diabetes Association: www.diabetes.org Association of Diabetes Care & Education Specialists: www.diabeteseducator.org Contact a health care provider if: You have a medical condition that increases your risk of infection and you have any cuts, sores, or bruises on your feet. You have an injury that is not healing. You have redness on your legs or feet. You   feel burning or tingling in your legs or feet. You have pain or cramps in your legs and feet. Your legs or feet are numb. Your feet always feel cold. You have pain around any toenails. Get help right away if: You have a wound, scrape, corn, or callus on your foot and: You have pain, swelling, or redness that gets worse. You have fluid or blood coming from the wound, scrape, corn, or callus. Your wound, scrape, corn, or callus feels warm to the touch. You have pus or a bad smell coming from the wound, scrape, corn, or callus. You have a fever. You have a red  line going up your leg. Summary Check your feet every day for blisters, cuts, bruises, sores, and redness. Apply a moisturizing lotion or petroleum jelly to the skin on your feet and to dry, brittle toenails. Wear shoes that fit properly and have enough cushioning. If you have foot problems, report any cuts, sores, or bruises to your health care provider immediately. Schedule a complete foot exam at least once a year (annually) or more often if you have foot problems. This information is not intended to replace advice given to you by your health care provider. Make sure you discuss any questions you have with your health care provider. Document Revised: 06/08/2020 Document Reviewed: 06/08/2020 Elsevier Patient Education  2023 Elsevier Inc.  

## 2022-03-27 ENCOUNTER — Telehealth: Payer: Medicare Other

## 2022-03-27 NOTE — Progress Notes (Deleted)
Chronic Care Management Pharmacy Note  03/27/2022 Name:  Jacqueline [name] MRN:  [id number] DOB:  [birth year]  Summary: ***  Recommendations/Changes made from today's visit: ***  Plan: ***   Subjective: Jacqueline [name] is an 76 y.o. year old female who is a primary patient of [name], [place].  The CCM team was consulted for assistance with disease management and care coordination needs.    {CCMTELEPHONEFACETOFACE:21091510} for {CCMINITIALFOLLOWUPCHOICE:21091511} in response to provider referral for pharmacy case management and/or care coordination services.   Consent to Services:  {CCMCONSENTOPTIONS:25074}  Patient Care Team: [name], FNP as PCP - General (General Practice) Sueanne Margarita, MD as PCP - Cardiology (Cardiology) Lynne Logan, RN as [place] Management [place], Tillie Rung as [place], Sharyn Blitz, [facility] (Pharmacist)  Recent office visits: ***  Recent consult visits: [facility] visits: {Hospital DC Yes/No:25215}   Objective:  Lab Results  Component Value Date   CREATININE 4.81 (H) 02/12/2022   BUN 21 02/12/2022   EGFR 5 (L) 10/10/2021   GFRNONAA 9 (L) 02/12/2022   GFRAA 5 (L) 06/07/2020   NA 135 02/12/2022   K 4.1 02/12/2022   CALCIUM 8.8 (L) 02/12/2022   CO2 30 02/12/2022   GLUCOSE 140 (H) 02/12/2022    Lab Results  Component Value Date/Time   HGBA1C 6.9 (H) 10/10/2021 11:09 AM   HGBA1C 6.6 06/12/2021 12:00 AM   HGBA1C 7.3 (H) 02/28/2021 10:41 AM    Last diabetic Eye exam:  Lab Results  Component Value Date/Time   HMDIABEYEEXA No Retinopathy 02/10/2019 12:00 AM    Last diabetic Foot exam: No results found for: HMDIABFOOTEX   Lab Results  Component Value Date   CHOL 142 10/10/2021   HDL 46 10/10/2021   LDLCALC 73 10/10/2021   TRIG 133 10/10/2021   CHOLHDL 3.1 10/10/2021       Latest Ref Rng & Units 02/12/2022    6:45 PM 12/14/2021    6:16 PM 10/10/2021   11:09  AM  Hepatic Function  Total Protein 6.5 - 8.1 g/dL 7.9   6.9   6.8    Albumin 3.5 - 5.0 g/dL 3.2   2.8   4.1    AST 15 - 41 U/L '29   17   22    ' ALT 0 - 44 U/L '15   12   17    ' Alk Phosphatase 38 - 126 U/L 79   74   145    Total Bilirubin 0.3 - 1.2 mg/dL 0.6   1.4   0.4      Lab Results  Component Value Date/Time   TSH 1.567 01/20/2014 09:12 AM       Latest Ref Rng & Units 02/25/2022    5:09 PM 02/12/2022    6:45 PM 12/14/2021    6:16 PM  CBC  WBC 3.4 - 10.8 x10E3/uL 10.6   10.1   12.2    Hemoglobin 11.1 - 15.9 g/dL 10.8   11.1   11.0    Hematocrit 34.0 - 46.6 % 33.6   36.5   35.4    Platelets 150 - 450 x10E3/uL 214   174   188      Lab Results  Component Value Date/Time   VD25OH 69.5 02/28/2021 10:41 AM    Clinical ASCVD: {YES/NO:21197} The ASCVD Risk score (Arnett DK, et al., 2019) failed to calculate for the following reasons:   The patient has a prior MI or  stroke diagnosis       06/27/2021    9:46 AM 06/07/2020   10:57 AM 06/07/2020    9:45 AM  Depression screen PHQ 2/9  Decreased Interest 0 0 0  Down, Depressed, Hopeless 0 0 0  PHQ - 2 Score 0 0 0  Altered sleeping  0   Tired, decreased energy  0   Change in appetite  0   Feeling bad or failure about yourself   0   Trouble concentrating  0   Moving slowly or fidgety/restless  0   Suicidal thoughts  0   PHQ-9 Score  0   Difficult doing work/chores  Not difficult at all      ***Other: (CHADS2VASc if Afib, MMRC or CAT for COPD, ACT, DEXA)  Social History   Tobacco Use  Smoking Status Former   Packs/day: 1.00   Years: 20.00   Pack years: 20.00   Types: Cigarettes   Quit date: 29   Years since quitting: 38.3  Smokeless Tobacco Never   BP Readings from Last 3 Encounters:  03/25/22 110/64  03/03/22 (!) 95/50  02/25/22 116/74   Pulse Readings from Last 3 Encounters:  03/25/22 64  03/03/22 87  02/25/22 83   Wt Readings from Last 3 Encounters:  03/25/22 238 lb (108 kg)  03/03/22 259 lb (117.5 kg)   12/14/21 249 lb (112.9 kg)   BMI Readings from Last 3 Encounters:  03/25/22 40.85 kg/m  03/03/22 44.46 kg/m  02/25/22 41.44 kg/m    Assessment/Interventions: Review of patient past medical history, allergies, medications, health status, including review of consultants reports, laboratory and other test data, was performed as part of comprehensive evaluation and provision of chronic care management services.   SDOH:  (Social Determinants of Health) assessments and interventions performed: {yes/no:20286}  SDOH Screenings   Alcohol Screen: Not on file  Depression (PHQ2-9): Low Risk    PHQ-2 Score: 0  Financial Resource Strain: Medium Risk   Difficulty of Paying Living Expenses: Somewhat hard  Food Insecurity: No Food Insecurity   Worried About Charity fundraiser in the Last Year: Never true   Ran Out of Food in the Last Year: Never true  Housing: Medium Risk   Last Housing Risk Score: 1  Physical Activity: Inactive   Days of Exercise per Week: 0 days   Minutes of Exercise per Session: 0 min  Social Connections: Not on file  Stress: No Stress Concern Present   Feeling of Stress : Not at all  Tobacco Use: Medium Risk   Smoking Tobacco Use: Former   Smokeless Tobacco Use: Never   Passive Exposure: Not on file  Transportation Needs: No Transportation Needs   Lack of Transportation (Medical): No   Lack of Transportation (Non-Medical): No    CCM Care Plan  Allergies  Allergen Reactions   Other Other (See Comments)   Sulfa Antibiotics Other (See Comments)   Sulfa Drugs Cross Reactors Other (See Comments)    Doesn't remember     Medications Reviewed Today     Reviewed by [name], FNP (Family Nurse Practitioner) on 03/25/22 at 1011  Med List Status: <None>   Medication Order Taking? Sig Documenting Provider Last Dose Status Informant  acetaminophen (TYLENOL) 500 MG tablet [id number] Yes Take 1,000 mg by mouth every 6 (six) hours as needed for moderate pain or  headache. [provider] Taking Active Self  albuterol (VENTOLIN HFA) 108 (90 Base) MCG/ACT inhaler [id number] Yes Inhale 1-2 puffs into the  lungs every 6 (six) hours as needed for wheezing or shortness of breath. Lianne Cure, DO Taking Active   aspirin EC 81 MG EC tablet [id number] Yes Take 1 tablet (81 mg total) by mouth daily. Geradine Girt, DO Taking Active Self  AURYXIA 1 GM 210 MG(Fe) tablet [id number] Yes Take 420 mg by mouth 2 (two) times a day. [provider] Taking Active Self           Med Note [place] Crane, MISTY D   Fri Dec 07, 2021 12:13 AM)    B Complex-C-Folic Acid (DIALYVITE 989) 0.8 MG TABS [id number] Yes Take 1 tablet by mouth daily. [provider] Taking Active Self  B Complex-C-Zn-Folic Acid (DIALYVITE/ZINC) TABS [id number] Yes Take 1 tablet by mouth daily. [provider] Taking Active   cinacalcet (SENSIPAR) 30 MG tablet [id number]  Take 30 mg by mouth daily. [provider]  Active Self           Med Note [place] Crane, MISTY D   Fri Dec 07, 2021 12:13 AM)    Doxercalciferol Darien Ramus IV) [id number] Yes Dialysis Tuesday,Thursday and saturday [provider] Taking Active Self  ezetimibe (ZETIA) 10 MG tablet [id number] Yes TAKE 1 TABLET BY MOUTH  DAILY  OVERDUE FOLLOW UP  APPOINTMENT NEEDED FOR  FURTHER REFILLS.  Patient taking differently: Take 10 mg by mouth daily.   Sueanne Margarita, MD Taking Active Self  gabapentin (NEURONTIN) 100 MG capsule [id number] Yes Take 1 capsule (100 mg total) by mouth daily. [name], FNP Taking Active   metoCLOPramide (REGLAN) 10 MG tablet [id number] Yes TAKE 1 TABLET BY MOUTH  TWICE DAILY 1/2 HOUR BEFORE MEALS [name], FNP Taking Active   midodrine (PROAMATINE) 10 MG tablet [id number] Yes Take 10 mg by mouth See admin instructions. Dialysis days [place] Saturday [provider] Taking Active Self           Med Note [place] Crane, MISTY D   Fri Dec 07, 2021 12:11 AM)     neomycin-polymyxin-hydrocortisone (CORTISPORIN) OTIC solution [id number] Yes 3 drops 4 (four) times daily. [provider] Taking Active   Omega-3 Fatty Acids (FISH OIL) 1000 MG CPDR [id number] Yes Take 1,000 mg by mouth daily.  [provider] Taking Active Self  omeprazole (PRILOSEC) 20 MG capsule [id number] Yes Take 1 capsule (20 mg total) by mouth daily. [name], FNP Taking Active   ondansetron [facility]) 4 MG tablet [id number] Yes Take 4 mg by mouth daily. [provider] Taking Active   rosuvastatin (CRESTOR) 40 MG tablet [id number] Yes Take 1 tablet (40 mg total) by mouth daily. Please schedule appointment for future refills. 3rd & FINAL attempt! Sueanne Margarita, MD Taking Active Self  Vitamin D, Ergocalciferol, 50 MCG (2000 UT) CAPS [id number] Yes Take 2,000 Units by mouth daily. [provider] Taking Active Self            Patient Active Problem List   Diagnosis Date Noted   Anaphylactic shock, unspecified, initial encounter 06/29/2020   Sepsis ([place]) 06/22/2019   Pain due to onychomycosis of toenails of both feet 06/11/2019   Callus 06/11/2019   Other disorders of phosphorus metabolism 05/20/2019   Mixed hyperlipidemia 03/22/2019   Otalgia of both ears 03/22/2019   Right arm pain 03/22/2019   Bradycardia 10/01/2017   Dyslipidemia 10/01/2017   Dependence on renal dialysis ([place]) 09/01/2017   Chest pain, unspecified 12/06/2016   Non-ST elevation (NSTEMI) myocardial infarction ([place]) 12/06/2016   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  History of NSTEMI    CAD S/P percutaneous coronary angioplasty 12/03/2016   Hypokalemia 05/03/2016   Pruritus, unspecified 03/27/2015   Encounter for removal of sutures 11/30/2014   Pain in lower limb 08/26/2014   Encounter for immunization 06/10/2014   Unspecified protein-calorie malnutrition ([place]) 02/28/2014   Allergy, unspecified, initial encounter 02/15/2014   HCAP (healthcare-associated pneumonia)  02/13/2014   Absence of both cervix and uterus, acquired 02/01/2014   Coagulation defect, unspecified ([place]) 02/01/2014   Dyspnea, unspecified 02/01/2014   Family history of diabetes mellitus 02/01/2014   Family history of ischemic heart disease and other diseases of the circulatory system 02/01/2014   Fever, unspecified 02/01/2014   Headache, unspecified 02/01/2014   History of allergy to sulfonamides 02/01/2014   Iron deficiency anemia, unspecified 02/01/2014   Long term (current) use of insulin ([place]) 02/01/2014   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   OSA (obstructive sleep apnea) 01/21/2014   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[place]) 01/21/2014   Acute encephalopathy 01/19/2014   Congestive heart failure ([place]) 01/15/2014   ESRD (end stage renal disease) on dialysis ([place]) 01/15/2014   Hypertension 01/15/2014   Diabetes mellitus ([place]) 01/15/2014   Anemia 01/15/2014   Onychomycosis 03/26/2013   Pain in joint, ankle and foot 03/26/2013    Immunization History  Administered Date(s) Administered   Hepatitis B, adult 07/05/2014, 08/04/2014, 09/01/2014, 01/03/2015, 12/28/2016, 01/28/2017, 03/01/2017, 07/01/2017   Hepb-cpg 01/04/2021, 02/01/2021, 03/08/2021, 05/03/2021   Influenza, High Dose Seasonal PF 08/26/2019, 08/29/2020, 09/13/2021   Influenza-Unspecified 09/30/2018, 08/30/2019   Moderna Sars-Covid-2 Vaccination 01/13/2020, 02/10/2020, 10/19/2020   Pneumococcal Conjugate-13 09/08/2018   Pneumococcal Polysaccharide-23 01/04/2017, 09/13/2019    Conditions to be addressed/monitored:  {USCCMDZASSESSMENTOPTIONS:23563}  There are no care plans that you recently modified to display for this patient.    Medication Assistance: {MEDASSISTANCEINFO:25044}  Compliance/Adherence/Medication fill history: Care Gaps: ***  Star-Rating Drugs: ***  Patient's preferred pharmacy is:  [id number] [place]  [address]Phone: [phone]:: [phone] [facility] ([place]EM[address] HeightsSuite [address] [place]Phone: [phone]: [phone] [id number] [place] [address]: [phone]: [phone] ([facility] ) - [place] [address]: [phone]: [phone] pill box? {Yes or If no, why not?:20788} Pt endorses ***% compliance  We discussed: {Pharmacy options:24294} Patient decided to: {US Pharmacy Plan:23885}  Care Plan and Follow Up Patient Decision:  {FOLLOWUP:24991}  Plan: The patient has been provided with contact information for the care management team and has been advised to call with any health related questions or concerns.   VaOrlando PennerCPP, PharmD Clinical Pharmacist Practitioner [facility] [phone] Current Barriers:  {pharmacybarriers:24917}  Pharmacist Clinical Goal(s):  Patient will {PHARMACYGOALCHOICES:24921} through collaboration with PharmD and provider.   Interventions: 1:1 collaboration with MoMinette BrineFNP regarding development and update of comprehensive plan of care as evidenced by provider attestation and co-signature Inter-disciplinary care team collaboration (see longitudinal plan of care) Comprehensive medication review performed; medication list updated in electronic medical record  {CCM PHARMD DISEASE STATES:25130}  Patient Goals/Self-Care Activities Patient will:  - {pharmacypatientgoals:24919}  Follow Up Plan: {CM FOLLOW UP PLTDHR:41638}

## 2022-03-28 ENCOUNTER — Ambulatory Visit: Payer: Medicare Other | Admitting: Cardiology

## 2022-04-03 ENCOUNTER — Telehealth: Payer: Medicare Other

## 2022-04-03 ENCOUNTER — Ambulatory Visit: Payer: Self-pay

## 2022-04-03 ENCOUNTER — Ambulatory Visit: Payer: Medicare Other

## 2022-04-03 DIAGNOSIS — I130 Hypertensive heart and chronic kidney disease with heart failure and stage 1 through stage 4 chronic kidney disease, or unspecified chronic kidney disease: Secondary | ICD-10-CM

## 2022-04-03 DIAGNOSIS — N186 End stage renal disease: Secondary | ICD-10-CM

## 2022-04-03 DIAGNOSIS — E1122 Type 2 diabetes mellitus with diabetic chronic kidney disease: Secondary | ICD-10-CM

## 2022-04-03 IMAGING — CT CT ABD-PELV W/ CM
2 of 5 series · 16 of 46 positions shown, 18 images · IV contrast (APPLIED)
Comparison: 07/23/2017

CLINICAL DATA: Emesis, abdominal pain

EXAM:
CT ABDOMEN AND PELVIS WITH CONTRAST
TECHNIQUE: Multidetector CT imaging of the abdomen and pelvis was performed
using the standard protocol following bolus administration of
intravenous contrast.

[Series 2: routine abd/pel with · axial · 0.94mm/px · z∈[-787,-332]mm · 13 of 101 slices shown, 15 images]
[im 5/101  soft-tissue]
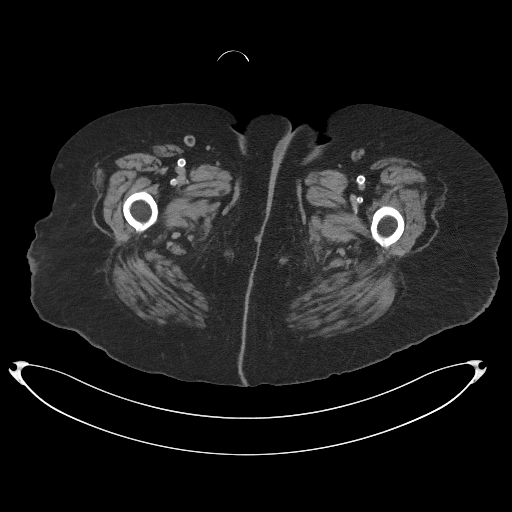
[im 5/101  bone]
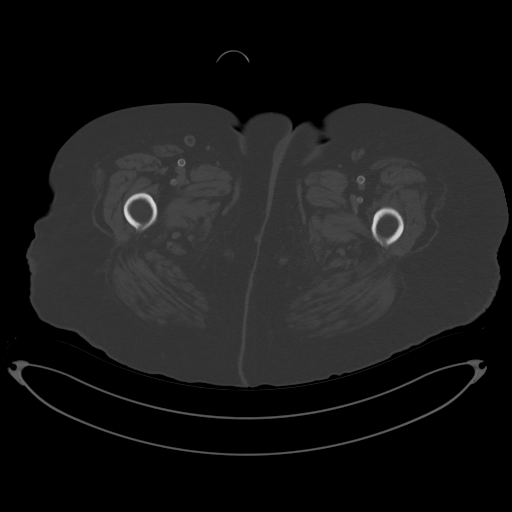
[im 15/101  soft-tissue]
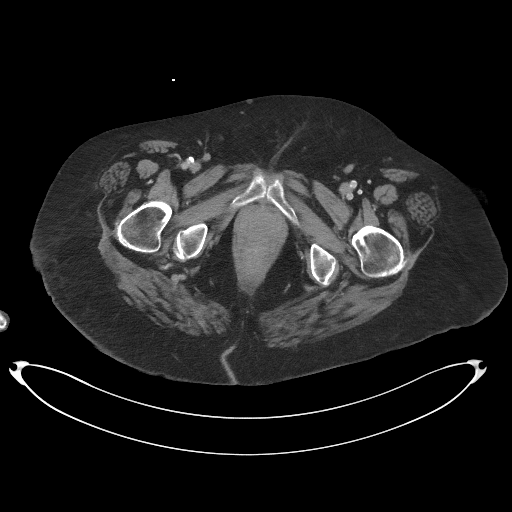
[im 20/101  soft-tissue]
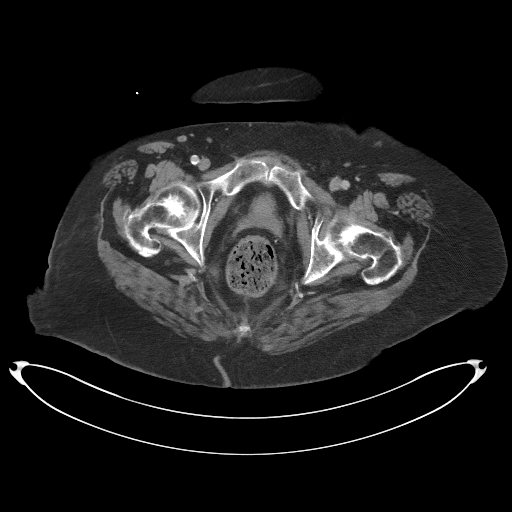
[im 29/101  soft-tissue]
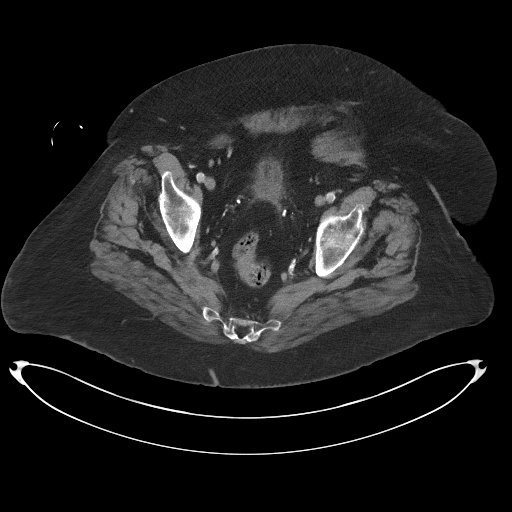
[im 34/101  soft-tissue]
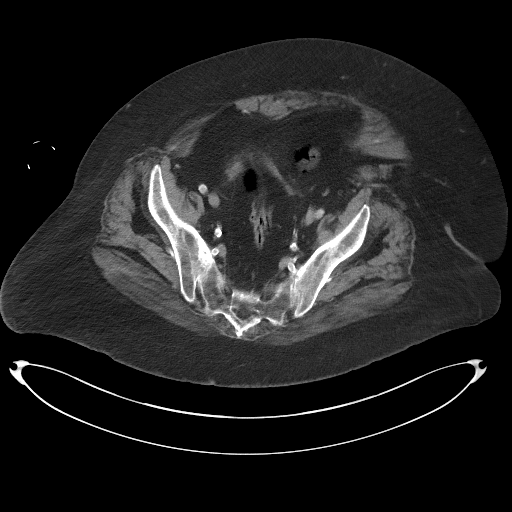
[im 43/101  soft-tissue]
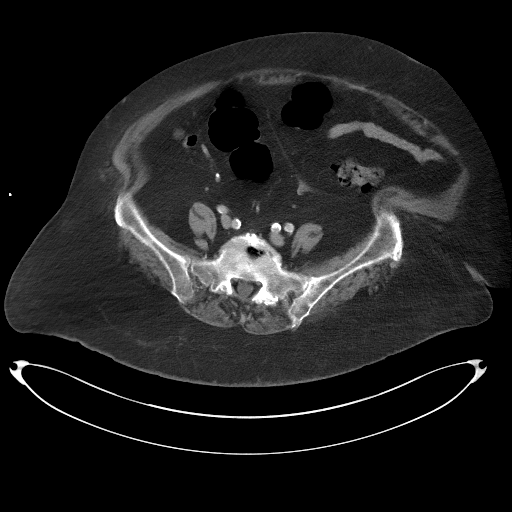
[im 53/101  soft-tissue]
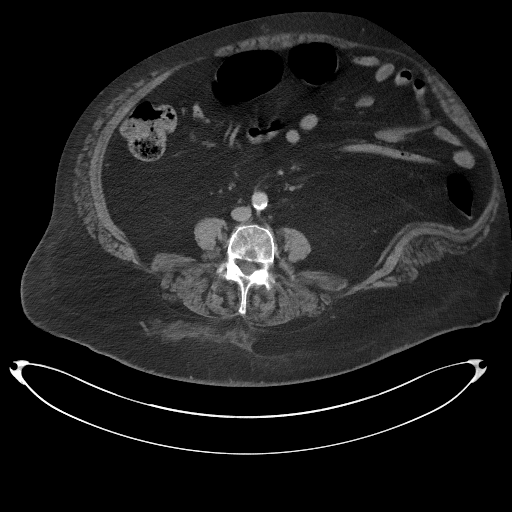
[im 58/101  soft-tissue]
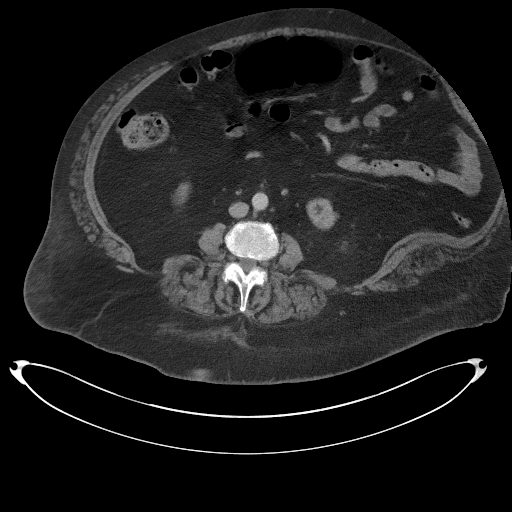
[im 67/101  soft-tissue]
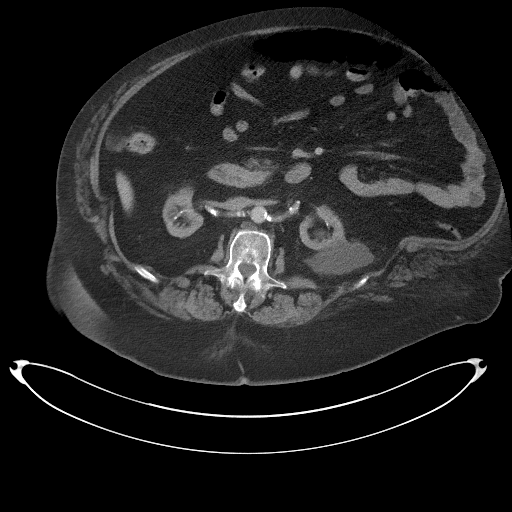
[im 67/101  bone]
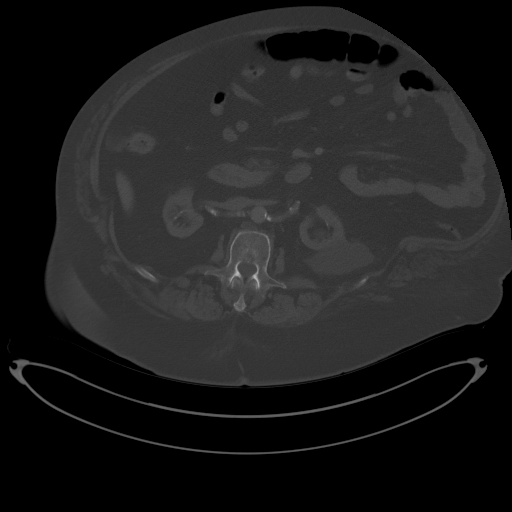
[im 72/101  soft-tissue]
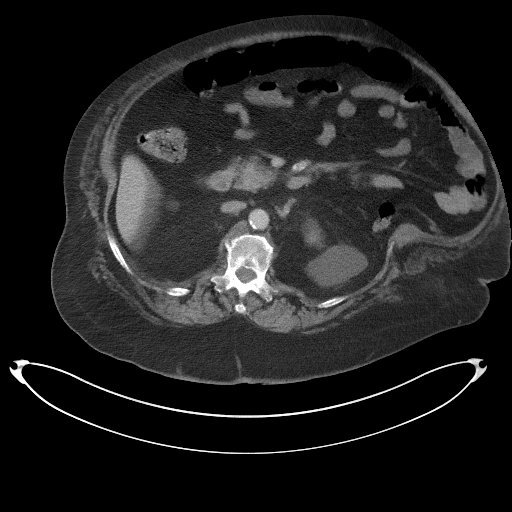
[im 81/101  soft-tissue]
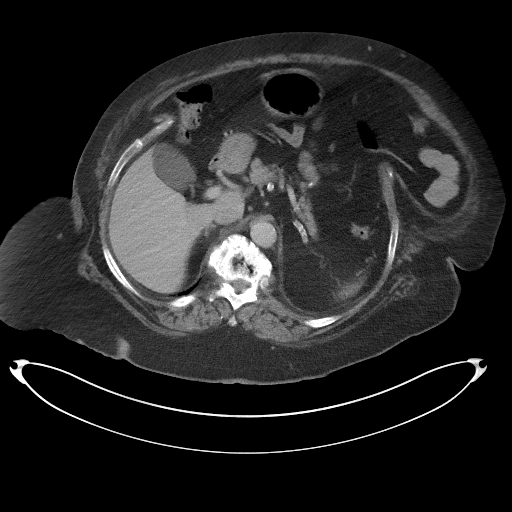
[im 86/101  soft-tissue]
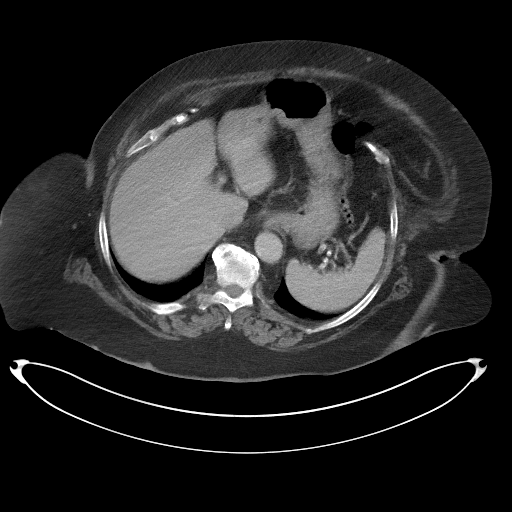
[im 96/101  soft-tissue]
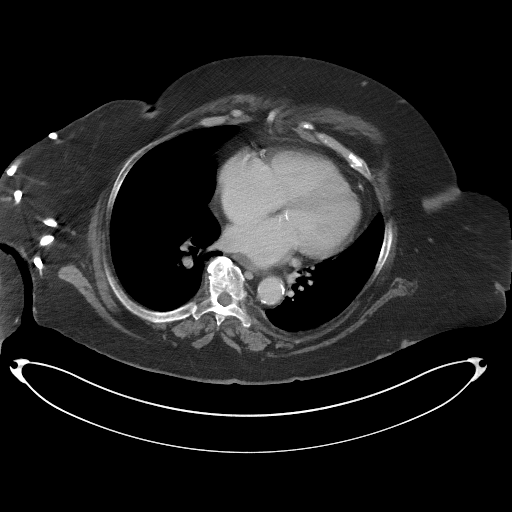

[Series 6: coronal st · coronal · 0.98mm/px · 3 of 125 slices shown]
[im 42/125  soft-tissue]
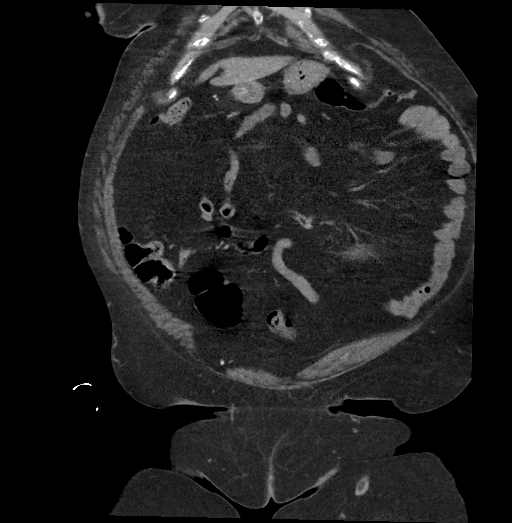
[im 56/125  soft-tissue]
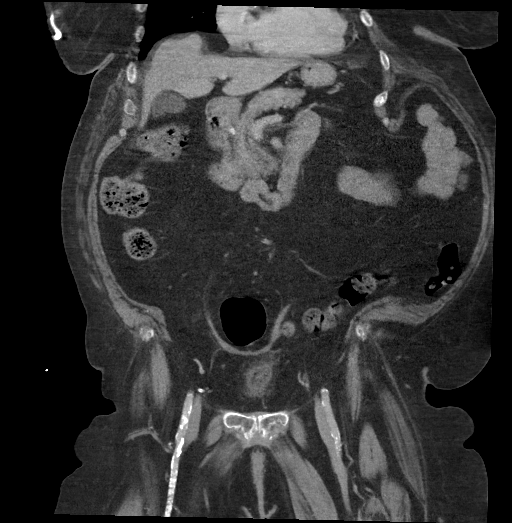
[im 69/125  soft-tissue]
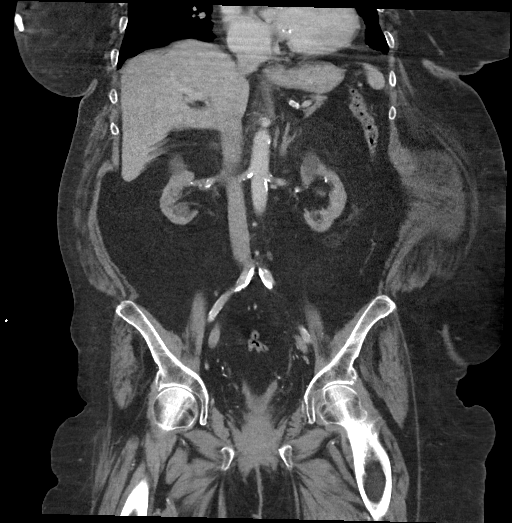

[16 of 46 positions shown; findings below may reference images not displayed]

RADIATION DOSE REDUCTION: This exam was performed according to the
departmental dose-optimization program which includes automated
exposure control, adjustment of the mA and/or kV according to
patient size and/or use of iterative reconstruction technique.

CONTRAST:  100mL OMNIPAQUE IOHEXOL 300 MG/ML  SOLN
FINDINGS: Lower chest: No acute pleural or parenchymal lung disease.

Hepatobiliary: Small calcified gallstones layer dependently within
the gallbladder. No evidence of acute cholecystitis. The liver is
unremarkable. No biliary duct dilation.

Pancreas: Unremarkable. No pancreatic ductal dilatation or
surrounding inflammatory changes.

Spleen: Normal in size without focal abnormality.

Adrenals/Urinary Tract: Bilateral renal cortical atrophy consistent
with end-stage renal disease. Numerous bilateral renal cortical
cysts. The adrenals are unremarkable. Bladder is decompressed,
limiting its evaluation.

Stomach/Bowel: No bowel obstruction or ileus. Normal appendix right
lower quadrant. Scattered colonic diverticulosis without
diverticulitis. No bowel wall thickening or inflammatory change.

Vascular/Lymphatic: Aortic atherosclerosis. No enlarged abdominal or
pelvic lymph nodes.

Reproductive: Status post hysterectomy. No adnexal masses.

Other: No free fluid or free intraperitoneal gas. No abdominal wall
hernia.

Musculoskeletal: No acute or destructive bony lesions. Reconstructed
images demonstrate no additional findings.
IMPRESSION: 1. Cholelithiasis without acute cholecystitis.
2. Minimal colonic diverticulosis without diverticulitis.
3. Chronic renal atrophy consistent with end-stage renal disease.
4.  Aortic Atherosclerosis ([id number]).

## 2022-04-03 NOTE — Patient Instructions (Signed)
Visit Information ? ?Thank you for taking time to visit with me today. Please don't hesitate to contact me if I can be of assistance to you before our next scheduled telephone appointment. ? ?Following are the goals we discussed today:  ? ?Patient Goals/Self-Care Activities ?patient will: with the help of her son [name] ? - Contact primary care provider as needed to address medical concerns ?-Review educational materials provided ?-Apply for long term care Medicaid if desired at local DSS ?-Contact SW as needed prior to next scheduled call ? ? ?Please call the care guide team at [phone] if you need to schedule an appointment with me prior to my next call to you. I will call you once I hear back from [facility]. ? ?If you are experiencing a Mental Health or [place] or need someone to talk to, please call the Suicide and Crisis Lifeline: 988  ? ?Following is a copy of your full plan of care:  ?Care Plan : Social Work Plan of Care  ?Updates made by Daneen Schick since 04/03/2022 12:00 AM  ?  ? ?Problem: Long-Term Care Planning   ?  ? ?Goal: Effective Long-Term Care Planning   ?Start Date: 04/03/2022  ?Priority: High  ?Note:   ?Current Barriers:  ?Chronic disease management support and education needs related to DM and ESRD  ?Level of care concerns ? ?Social Worker Clinical Goal(s):  ?patient will work with SW to identify and address any acute and/or chronic care coordination needs related to the self health management of DM and ESRD  ?patient will work with SW to address concerns related to level of care ?SW Interventions:  ?Inter-disciplinary care team collaboration (see longitudinal plan of care) ?Collaboration with [name], FNP regarding development and update of comprehensive plan of care as evidenced by provider attestation and co-signature ?Telephonic visit completed with the patients son [name] whom the patient is currently living with ?Discussed the patients husband whom was her primary  caregiver passed away yesterday after an illness. The patient has been staying with her son for several weeks and the family feels she is declining ?The patient is alone during the day while her son and daughter in law are at work - the family feels it is best to place the patient if possible ?Determined [name] has spoken with his mom about this and she is agreeable that she may need a higher level of care ?Reviewed process for placement including applying for long term care Medicaid ?Provided education on [facility] as an alternative option to placement ?[name] reports the patient is active with [facility] and the PT recently mentioned the patient may need SNF level rehab ?Advised [name] SW would reach out to PT to determine if steps have been made to move forward with skilled rehab. - Voice message left requesting a return call ?Discussed patients provider [name] FNP has recommended skilled level of care for long term placement ?Provided education on process for applying for long term care Medicaid via DSS - [name] is concerned the patient may not qualify. Encouraged him to direct questions regarding Medicaid to DSS ?Discussed plan for follow up with SW regarding long term care planning while patient engages with  RN Case Manager  to address care management needs ?Collaboration with RN Care Manager regarding interventions and plan ? ?Patient Goals/Self-Care Activities ?patient will: with the help of her son [name] ? -  Contact primary care provider as needed to address medical concerns ?-Review educational  materials provided ?-Apply for long term care Medicaid if desired at local DSS ?-Contact SW as needed prior to next scheduled call ? ?Follow Up Plan:  SW will follow up with patient after speaking with [facility] PT ? ?  ? ? ?[name] was given information about Care Management services by the embedded care coordination team including:  ?Care Management services  include personalized support from designated clinical staff supervised by her physician, including individualized plan of care and coordination with other care providers ?24/7 contact phone numbers for assistance for urgent and routine care needs. ?The patient may stop CCM services at any time (effective at the end of the month) by phone call to the office staff. ? ?Patient agreed to services and verbal consent obtained.  ? ?Patient verbalizes understanding of instructions and care plan provided today and agrees to view in [place]. Active MyChart status confirmed with patient.   ? ?SW will follow up with [facility] if a return call is not received prior to outreaching the patient for follow up. ? ?Daneen Schick, BSW, CDP ?Social Worker, Certified Dementia Practitioner ?[facility] [place] ?[phone] ? ?   ? ? ?  ?

## 2022-04-03 NOTE — Chronic Care Management (AMB) (Signed)
?Chronic Care Management  ? ?CCM RN Visit Note ? ?04/03/2022 ?Name: KENNITA PAVLOVICH MRN: [id number] DOB: 1946/04/26 ? ?Subjective: ?LORELLA GOMEZ is a 76 y.o. year old female who is a primary care patient of [name], [place]. The care management team was consulted for assistance with disease management and care coordination needs.   ? ?Collaboration with Daneen Schick BSW  for  initiation of plan of care  in response to provider referral for case management and/or care coordination services.  ? ?Consent to Services:  ?The patient was given information about Chronic Care Management services, agreed to services, and gave verbal consent prior to initiation of services.  Please see initial visit note for detailed documentation.  ? ?Patient agreed to services and verbal consent obtained.  ? ?Assessment: Review of patient past medical history, allergies, medications, health status, including review of consultants reports, laboratory and other test data, was performed as part of comprehensive evaluation and provision of chronic care management services.  ? ?SDOH (Social Determinants of Health) assessments and interventions performed:  See care plan  ? ?CCM Care Plan ? ?Allergies  ?Allergen Reactions  ? Other Other (See Comments)  ? Sulfa Antibiotics Other (See Comments)  ? Sulfa Drugs Cross Reactors Other (See Comments)  ?  Doesn't remember   ? ? ?Outpatient Encounter Medications as of 04/03/2022  ?Medication Sig  ? acetaminophen (TYLENOL) 500 MG tablet Take 1,000 mg by mouth every 6 (six) hours as needed for moderate pain or headache.  ? albuterol (VENTOLIN HFA) 108 (90 Base) MCG/ACT inhaler Inhale 1-2 puffs into the lungs every 6 (six) hours as needed for wheezing or shortness of breath.  ? aspirin EC 81 MG EC tablet Take 1 tablet (81 mg total) by mouth daily.  ? AURYXIA 1 GM 210 MG(Fe) tablet Take 420 mg by mouth 2 (two) times a day.  ? B Complex-C-Folic Acid (DIALYVITE 664) 0.8 MG TABS Take 1 tablet by mouth daily.   ? B Complex-C-Zn-Folic Acid (DIALYVITE/ZINC) TABS Take 1 tablet by mouth daily.  ? cinacalcet (SENSIPAR) 30 MG tablet Take 30 mg by mouth daily.  ? Doxercalciferol (HECTOROL IV) Dialysis Tuesday,Thursday and saturday  ? ezetimibe (ZETIA) 10 MG tablet TAKE 1 TABLET BY MOUTH  DAILY  OVERDUE FOLLOW UP  APPOINTMENT NEEDED FOR  FURTHER REFILLS. (Patient taking differently: Take 10 mg by mouth daily.)  ? gabapentin (NEURONTIN) 100 MG capsule Take 1 capsule (100 mg total) by mouth daily.  ? metoCLOPramide (REGLAN) 10 MG tablet TAKE 1 TABLET BY MOUTH  TWICE DAILY 1/2 HOUR BEFORE MEALS  ? midodrine (PROAMATINE) 10 MG tablet Take 10 mg by mouth See admin instructions. Dialysis days [facility] Saturday  ? neomycin-polymyxin-hydrocortisone (CORTISPORIN) OTIC solution 3 drops 4 (four) times daily.  ? Omega-3 Fatty Acids (FISH OIL) 1000 MG CPDR Take 1,000 mg by mouth daily.   ? omeprazole (PRILOSEC) 20 MG capsule Take 1 capsule (20 mg total) by mouth daily.  ? ondansetron (ZOFRAN) 4 MG tablet Take 4 mg by mouth daily.  ? rosuvastatin (CRESTOR) 40 MG tablet Take 1 tablet (40 mg total) by mouth daily. Please schedule appointment for future refills. 3rd & FINAL attempt!  ? Vitamin D, Ergocalciferol, 50 MCG (2000 UT) CAPS Take 2,000 Units by mouth daily.  ? ?No facility-administered encounter medications on file as of 04/03/2022.  ? ? ?Patient Active Problem List  ? Diagnosis Date Noted  ? Anaphylactic shock, unspecified, initial encounter 06/29/2020  ? Sepsis ([place]) 06/22/2019  ? Pain  due to onychomycosis of toenails of both feet 06/11/2019  ? Callus 06/11/2019  ? Other disorders of phosphorus metabolism 05/20/2019  ? Mixed hyperlipidemia 03/22/2019  ? Otalgia of both ears 03/22/2019  ? Right arm pain 03/22/2019  ? Bradycardia 10/01/2017  ? Dyslipidemia 10/01/2017  ? Dependence on renal dialysis ([place]) 09/01/2017  ? Chest pain, unspecified 12/06/2016  ? Non-ST elevation (NSTEMI) myocardial infarction ([place]) 12/06/2016  ?  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 History of NSTEMI   ? CAD S/P percutaneous coronary angioplasty 12/03/2016  ? Hypokalemia 05/03/2016  ? Pruritus, unspecified 03/27/2015  ? Encounter for removal of sutures 11/30/2014  ? Pain in lower limb 08/26/2014  ? Encounter for immunization 06/10/2014  ? Unspecified protein-calorie malnutrition ([place]) 02/28/2014  ? Allergy, unspecified, initial encounter 02/15/2014  ? HCAP (healthcare-associated pneumonia) 02/13/2014  ? Absence of both cervix and uterus, acquired 02/01/2014  ? Coagulation defect, unspecified ([place]) 02/01/2014  ? Dyspnea, unspecified 02/01/2014  ? Family history of diabetes mellitus 02/01/2014  ? Family history of ischemic heart disease and other diseases of the circulatory system 02/01/2014  ? Fever, unspecified 02/01/2014  ? Headache, unspecified 02/01/2014  ? History of allergy to sulfonamides 02/01/2014  ? Iron deficiency anemia, unspecified 02/01/2014  ? Long term (current) use of insulin ([place]) 02/01/2014  ? 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 OSA (obstructive sleep apnea) 01/21/2014  ? 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[place]) 01/21/2014  ? Acute encephalopathy 01/19/2014  ? Congestive heart failure ([place]) 01/15/2014  ? ESRD (end stage renal disease) on dialysis ([place]) 01/15/2014  ? Hypertension 01/15/2014  ? Diabetes mellitus ([employer]) 01/15/2014  ? Anemia 01/15/2014  ? Onychomycosis 03/26/2013  ? Pain in joint, ankle and foot 03/26/2013  ? ? 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 ?Care Plan : RN Care Manager Plan of Care  ?Updates made by Lynne Logan, RN since 04/03/2022 12:00 AM  ?  ? 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   ? 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  ? ?Interventions: ?1:1 collaboration with primary care provider regarding development and update of comprehensive plan of care as evidenced by provider attestation and co-signature ?Inter-disciplinary care team collaboration (see longitudinal plan of care) ?Evaluation of current  treatment plan related to  self management and patient's adherence to plan as established by provider ? ?Interdisciplinary Collaboration Interventions:  (Status: New goal.) Short Term Goal   ?Collaborated with BSW to initiate

## 2022-04-03 NOTE — Chronic Care Management (AMB) (Signed)
? Social Work Note ? ?04/03/2022 ?Name: Jacqueline [name] MRN: [id number] DOB: [birth year] ? ?Jacqueline [name] is a 76 y.o. year old female who is a primary care patient of [name], [place].  The Care Management team was consulted for assistance with chronic disease management and care coordination needs. ? ?[name] was given information about Care Management services today including:  ?Care Management services include personalized support from designated clinical staff supervised by her physician, including individualized plan of care and coordination with other care providers ?24/7 contact phone numbers for assistance for urgent and routine care needs. ?The patient may stop care management services at any time (effective at the end of the month) by phone call to the office staff. ? ?Patient agreed to services and consent obtained.  ? ?Engaged with patients son [name] by phone  for initial visit in response to provider referral for social work chronic care management and care coordination services. ? ?Assessment: Review of patient past medical history, allergies, medications, and health status, including review of pertinent consultant reports was performed as part of comprehensive evaluation and provision of care management/care coordination services.  ? ?SDOH (Social Determinants of Health) assessments and interventions performed:  Yes ?SDOH Interventions   ? ?Flowsheet Row Most Recent Value  ?SDOH Interventions   ?Food Insecurity Interventions Intervention Not Indicated  ?Housing Interventions Intervention Not Indicated  ?Transportation Interventions Intervention Not Indicated  ? ?  ?  ? ?Advanced Directives Status: Not addressed in this encounter. ? ?Care Plan ? ?Allergies  ?Allergen Reactions  ? Other Other (See Comments)  ? Sulfa Antibiotics Other (See Comments)  ? Sulfa Drugs Cross Reactors Other (See Comments)  ?  Doesn't remember   ? ? ?Outpatient Encounter Medications as of 04/03/2022  ?Medication Sig   ? acetaminophen (TYLENOL) 500 MG tablet Take 1,000 mg by mouth every 6 (six) hours as needed for moderate pain or headache.  ? albuterol (VENTOLIN HFA) 108 (90 Base) MCG/ACT inhaler Inhale 1-2 puffs into the lungs every 6 (six) hours as needed for wheezing or shortness of breath.  ? aspirin EC 81 MG EC tablet Take 1 tablet (81 mg total) by mouth daily.  ? AURYXIA 1 GM 210 MG(Fe) tablet Take 420 mg by mouth 2 (two) times a day.  ? B Complex-C-Folic Acid (DIALYVITE 035) 0.8 MG TABS Take 1 tablet by mouth daily.  ? B Complex-C-Zn-Folic Acid (DIALYVITE/ZINC) TABS Take 1 tablet by mouth daily.  ? cinacalcet (SENSIPAR) 30 MG tablet Take 30 mg by mouth daily.  ? Doxercalciferol (HECTOROL IV) Dialysis Tuesday,Thursday and saturday  ? ezetimibe (ZETIA) 10 MG tablet TAKE 1 TABLET BY MOUTH  DAILY  OVERDUE FOLLOW UP  APPOINTMENT NEEDED FOR  FURTHER REFILLS. (Patient taking differently: Take 10 mg by mouth daily.)  ? gabapentin (NEURONTIN) 100 MG capsule Take 1 capsule (100 mg total) by mouth daily.  ? metoCLOPramide (REGLAN) 10 MG tablet TAKE 1 TABLET BY MOUTH  TWICE DAILY 1/2 HOUR BEFORE MEALS  ? midodrine (PROAMATINE) 10 MG tablet Take 10 mg by mouth See admin instructions. Dialysis days [facility] Saturday  ? neomycin-polymyxin-hydrocortisone (CORTISPORIN) OTIC solution 3 drops 4 (four) times daily.  ? Omega-3 Fatty Acids (FISH OIL) 1000 MG CPDR Take 1,000 mg by mouth daily.   ? omeprazole (PRILOSEC) 20 MG capsule Take 1 capsule (20 mg total) by mouth daily.  ? ondansetron (ZOFRAN) 4 MG tablet Take 4 mg by mouth daily.  ? rosuvastatin (CRESTOR) 40 MG tablet Take 1 tablet (40  mg total) by mouth daily. Please schedule appointment for future refills. 3rd & FINAL attempt!  ? Vitamin D, Ergocalciferol, 50 MCG (2000 UT) CAPS Take 2,000 Units by mouth daily.  ? ?No facility-administered encounter medications on file as of 04/03/2022.  ? ? ?Patient Active Problem List  ? Diagnosis Date Noted  ? Anaphylactic shock, unspecified,  initial encounter 06/29/2020  ? Sepsis ([place]) 06/22/2019  ? Pain due to onychomycosis of toenails of both feet 06/11/2019  ? Callus 06/11/2019  ? Other disorders of phosphorus metabolism 05/20/2019  ? Mixed hyperlipidemia 03/22/2019  ? Otalgia of both ears 03/22/2019  ? Right arm pain 03/22/2019  ? Bradycardia 10/01/2017  ? Dyslipidemia 10/01/2017  ? Dependence on renal dialysis ([place]) 09/01/2017  ? Chest pain, unspecified 12/06/2016  ? Non-ST elevation (NSTEMI) myocardial infarction ([place]) 12/06/2016  ? 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 History of NSTEMI   ? CAD S/P percutaneous coronary angioplasty 12/03/2016  ? Hypokalemia 05/03/2016  ? Pruritus, unspecified 03/27/2015  ? Encounter for removal of sutures 11/30/2014  ? Pain in lower limb 08/26/2014  ? Encounter for immunization 06/10/2014  ? Unspecified protein-calorie malnutrition ([place]) 02/28/2014  ? Allergy, unspecified, initial encounter 02/15/2014  ? HCAP (healthcare-associated pneumonia) 02/13/2014  ? Absence of both cervix and uterus, acquired 02/01/2014  ? Coagulation defect, unspecified ([place]) 02/01/2014  ? Dyspnea, unspecified 02/01/2014  ? Family history of diabetes mellitus 02/01/2014  ? Family history of ischemic heart disease and other diseases of the circulatory system 02/01/2014  ? Fever, unspecified 02/01/2014  ? Headache, unspecified 02/01/2014  ? History of allergy to sulfonamides 02/01/2014  ? Iron deficiency anemia, unspecified 02/01/2014  ? Long term (current) use of insulin ([place]) 02/01/2014  ? 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 OSA (obstructive sleep apnea) 01/21/2014  ? 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[place]) 01/21/2014  ? Acute encephalopathy 01/19/2014  ? Congestive heart failure ([place]) 01/15/2014  ? ESRD (end stage renal disease) on dialysis ([place]) 01/15/2014  ? Hypertension 01/15/2014  ? Diabetes mellitus ([place]) 01/15/2014  ? Anemia 01/15/2014  ? Onychomycosis 03/26/2013  ? Pain in joint, ankle and foot 03/26/2013   ? ? ?Conditions to be addressed/monitored: DMII and ESRD; Level of care concerns ? ?Care Plan : Social Work Plan of Care  ?Updates made by Daneen Schick since 04/03/2022 12:00 AM  ?  ? ?Problem: Long-Term Care Planning   ?  ? ?Goal: Effective Long-Term Care Planning   ?Start Date: 04/03/2022  ?Priority: High  ?Note:   ?Current Barriers:  ?Chronic disease management support and education needs related to DM and ESRD  ?Level of care concerns ? ?Social Worker Clinical Goal(s):  ?patient will work with SW to identify and address any acute and/or chronic care coordination needs related to the self health management of DM and ESRD  ?patient will work with SW to address concerns related to level of care ?SW Interventions:  ?Inter-disciplinary care team collaboration (see longitudinal plan of care) ?Collaboration with [name], FNP regarding development and update of comprehensive plan of care as evidenced by provider attestation and co-signature ?Telephonic visit completed with the patients son [name] whom the patient is currently living with ?Discussed the patients husband whom was her primary caregiver passed away yesterday after an illness. The patient has been staying with her son for several weeks and the family feels she is declining ?The patient is alone during the day while her son and daughter in law are at work - the family feels it is best to place the  patient if possible ?Determined [name] has spoken with his mom about this and she is agreeable that she may need a higher level of care ?Reviewed process for placement including applying for long term care Medicaid ?Provided education on [facility] as an alternative option to placement ?[name] reports the patient is active with [facility] and the PT recently mentioned the patient may need SNF level rehab ?Advised [name] SW would reach out to PT to determine if steps have been made to move forward with skilled rehab. - Voice message  left requesting a return call ?Discussed patients provider [name] FNP has recommended skilled level of care for long term placement ?Provided education on process for applying for long term care Medicaid vi

## 2022-04-05 ENCOUNTER — Ambulatory Visit: Payer: Self-pay

## 2022-04-05 ENCOUNTER — Ambulatory Visit (INDEPENDENT_AMBULATORY_CARE_PROVIDER_SITE_OTHER): Payer: Medicare Other

## 2022-04-05 DIAGNOSIS — I509 Heart failure, unspecified: Secondary | ICD-10-CM

## 2022-04-05 DIAGNOSIS — M79672 Pain in left foot: Secondary | ICD-10-CM

## 2022-04-05 DIAGNOSIS — Z992 Dependence on renal dialysis: Secondary | ICD-10-CM

## 2022-04-05 DIAGNOSIS — R0989 Other specified symptoms and signs involving the circulatory and respiratory systems: Secondary | ICD-10-CM

## 2022-04-05 DIAGNOSIS — M79671 Pain in right foot: Secondary | ICD-10-CM | POA: Diagnosis not present

## 2022-04-05 DIAGNOSIS — E1122 Type 2 diabetes mellitus with diabetic chronic kidney disease: Secondary | ICD-10-CM

## 2022-04-05 NOTE — Chronic Care Management (AMB) (Signed)
? Social Work Note ? ?04/05/2022 ?Name: CHANTRICE [name] MRN: [id number] DOB: 1946/04/30 ? ?Jacqueline [name] is a 76 y.o. year old female who is a primary care patient of [name], [place].  The Care Management team was consulted for assistance with chronic disease management and care coordination needs. ? ?[name] was given information about Care Management services today including:  ?Care Management services include personalized support from designated clinical staff supervised by her physician, including individualized plan of care and coordination with other care providers ?24/7 contact phone numbers for assistance for urgent and routine care needs. ?The patient may stop care management services at any time (effective at the end of the month) by phone call to the office staff. ? ?Patient agreed to services and consent obtained.  ? ?Engaged with patient by telephone for follow up visit in response to provider referral for social work chronic care management and care coordination services. ? ?Assessment: Review of patient past medical history, allergies, medications, and health status, including review of pertinent consultant reports was performed as part of comprehensive evaluation and provision of care management/care coordination services.  ? ?SDOH (Social Determinants of Health) assessments and interventions performed:  No ?  ? ?Advanced Directives Status: Not addressed in this encounter. ? ?Care Plan ? ?Allergies  ?Allergen Reactions  ? Other Other (See Comments)  ? Sulfa Antibiotics Other (See Comments)  ? Sulfa Drugs Cross Reactors Other (See Comments)  ?  Doesn't remember   ? ? ?Outpatient Encounter Medications as of 04/05/2022  ?Medication Sig  ? acetaminophen (TYLENOL) 500 MG tablet Take 1,000 mg by mouth every 6 (six) hours as needed for moderate pain or headache.  ? albuterol (VENTOLIN HFA) 108 (90 Base) MCG/ACT inhaler Inhale 1-2 puffs into the lungs every 6 (six) hours as needed for wheezing or  shortness of breath.  ? aspirin EC 81 MG EC tablet Take 1 tablet (81 mg total) by mouth daily.  ? AURYXIA 1 GM 210 MG(Fe) tablet Take 420 mg by mouth 2 (two) times a day.  ? B Complex-C-Folic Acid (DIALYVITE 326) 0.8 MG TABS Take 1 tablet by mouth daily.  ? B Complex-C-Zn-Folic Acid (DIALYVITE/ZINC) TABS Take 1 tablet by mouth daily.  ? cinacalcet (SENSIPAR) 30 MG tablet Take 30 mg by mouth daily.  ? Doxercalciferol (HECTOROL IV) Dialysis Tuesday,Thursday and saturday  ? ezetimibe (ZETIA) 10 MG tablet TAKE 1 TABLET BY MOUTH  DAILY  OVERDUE FOLLOW UP  APPOINTMENT NEEDED FOR  FURTHER REFILLS. (Patient taking differently: Take 10 mg by mouth daily.)  ? gabapentin (NEURONTIN) 100 MG capsule Take 1 capsule (100 mg total) by mouth daily.  ? metoCLOPramide (REGLAN) 10 MG tablet TAKE 1 TABLET BY MOUTH  TWICE DAILY 1/2 HOUR BEFORE MEALS  ? midodrine (PROAMATINE) 10 MG tablet Take 10 mg by mouth See admin instructions. Dialysis days Jacqueline [name] Saturday  ? neomycin-polymyxin-hydrocortisone (CORTISPORIN) OTIC solution 3 drops 4 (four) times daily.  ? Omega-3 Fatty Acids (FISH OIL) 1000 MG CPDR Take 1,000 mg by mouth daily.   ? omeprazole (PRILOSEC) 20 MG capsule Take 1 capsule (20 mg total) by mouth daily.  ? ondansetron (ZOFRAN) 4 MG tablet Take 4 mg by mouth daily.  ? rosuvastatin (CRESTOR) 40 MG tablet Take 1 tablet (40 mg total) by mouth daily. Please schedule appointment for future refills. 3rd & FINAL attempt!  ? Vitamin D, Ergocalciferol, 50 MCG (2000 UT) CAPS Take 2,000 Units by mouth daily.  ? ?No facility-administered encounter medications on file  as of 04/05/2022.  ? ? ?Patient Active Problem List  ? Diagnosis Date Noted  ? Anaphylactic shock, unspecified, initial encounter 06/29/2020  ? Sepsis ([place]) 06/22/2019  ? Pain due to onychomycosis of toenails of both feet 06/11/2019  ? Callus 06/11/2019  ? Other disorders of phosphorus metabolism 05/20/2019  ? Mixed hyperlipidemia 03/22/2019  ? Otalgia of both ears 03/22/2019   ? Right arm pain 03/22/2019  ? Bradycardia 10/01/2017  ? Dyslipidemia 10/01/2017  ? Dependence on renal dialysis ([place]) 09/01/2017  ? Chest pain, unspecified 12/06/2016  ? Non-ST elevation (NSTEMI) myocardial infarction ([place]) 12/06/2016  ? 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 History of NSTEMI   ? CAD S/P percutaneous coronary angioplasty 12/03/2016  ? Hypokalemia 05/03/2016  ? Pruritus, unspecified 03/27/2015  ? Encounter for removal of sutures 11/30/2014  ? Pain in lower limb 08/26/2014  ? Encounter for immunization 06/10/2014  ? Unspecified protein-calorie malnutrition ([place]) 02/28/2014  ? Allergy, unspecified, initial encounter 02/15/2014  ? HCAP (healthcare-associated pneumonia) 02/13/2014  ? Absence of both cervix and uterus, acquired 02/01/2014  ? Coagulation defect, unspecified ([place]) 02/01/2014  ? Dyspnea, unspecified 02/01/2014  ? Family history of diabetes mellitus 02/01/2014  ? Family history of ischemic heart disease and other diseases of the circulatory system 02/01/2014  ? Fever, unspecified 02/01/2014  ? Headache, unspecified 02/01/2014  ? History of allergy to sulfonamides 02/01/2014  ? Iron deficiency anemia, unspecified 02/01/2014  ? Long term (current) use of insulin ([place]) 02/01/2014  ? 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 OSA (obstructive sleep apnea) 01/21/2014  ? 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[place]) 01/21/2014  ? Acute encephalopathy 01/19/2014  ? Congestive heart failure ([place]) 01/15/2014  ? ESRD (end stage renal disease) on dialysis ([place]) 01/15/2014  ? Hypertension 01/15/2014  ? Diabetes mellitus ([place]) 01/15/2014  ? Anemia 01/15/2014  ? Onychomycosis 03/26/2013  ? Pain in joint, ankle and foot 03/26/2013  ? ? ?Conditions to be addressed/monitored: CHF, DMII, and ESRD; Level of care concerns ? ?Care Plan : Social Work Plan of Care  ?Updates made by Daneen Schick since 04/05/2022 12:00 AM  ?  ? ?Problem: Long-Term Care Planning   ?  ? ?Goal: Effective Long-Term Care Planning    ?Start Date: 04/03/2022  ?Priority: High  ?Note:   ?Current Barriers:  ?Chronic disease management support and education needs related to DM and ESRD  ?Level of care concerns ? ?Social Worker Clinical Goal(s):  ?patient will work with SW to identify and address any acute and/or chronic care coordination needs related to the self health management of DM and ESRD  ?patient will work with SW to address concerns related to level of care ?SW Interventions:  ?Inter-disciplinary care team collaboration (see longitudinal plan of care) ?Collaboration with [name], FNP regarding development and update of comprehensive plan of care as evidenced by provider attestation and co-signature ?Collaboration with [facility] PT who indicates patients is being followed by [facility] but based on notes patient is not consistently participating in therapy most likely due to lack of motivation and physical inability to engage ?Discussed patient attends dialysis 3 x per week which impacts her energy level ?PT indicates patient mainly uses wheelchair and [facility] is not assisting with placement into SNF for rehab ?SW attempted to follow up with patients son, [name].  ?[name] unavailable, spoke with patient who indicates she is having a tough time since losing her husband- patient declined resources for grief counseling ?Discussed funeral is planned for Tuesday afternoon; SW will attempt to follow up with  the patient and her son late next week ? ?Patient Goals/Self-Care Activities ?patient will: with the help of her son [name] ? -  Contact primary care provider as needed to address medical concerns ?-Review educational materials provided ?-Apply for long term care Medicaid if desired at local DSS ?-Contact SW as needed prior to next scheduled call ? ?Follow Up Plan:  SW will follow up with patient over the next 10 days ? ?  ?  ? ?Follow Up Plan: SW will follow up with patient by phone over the next 10 days ?     ?Daneen Schick, BSW, CDP ?Social  Worker, Certified Dementia Practitioner ?[facility] / [place] [facility] ?[phone] ? ?   ? ? ?

## 2022-04-05 NOTE — Patient Instructions (Signed)
Visit Information ? ?Thank you for taking time to visit with me today. Please don't hesitate to contact me if I can be of assistance to you before our next scheduled telephone appointment. ? ?Following are the goals we discussed today:  ? ?Patient Goals/Self-Care Activities ?patient will: with the help of her son [name] ? -  Contact primary care provider as needed to address medical concerns ?-Review educational materials provided ?-Apply for long term care Medicaid if desired at local DSS ?-Contact SW as needed prior to next scheduled call ? ? ?Please call the care guide team at [phone] if you need to cancel or reschedule your appointment.  ? ?If you are experiencing a Mental Health or [place] or need someone to talk to, please call the Suicide and Crisis Lifeline: 988  ? ?Patient verbalizes understanding of instructions and care plan provided today and agrees to view in [place]. Active MyChart status confirmed with patient.   ? ?The care management team will reach out to the patient again over the next 10 days.  ? ?Daneen Schick, BSW, CDP ?Social Worker, Certified Dementia Practitioner ?[facility] / [place] [facility] ?[phone] ? ?   ? ?

## 2022-04-08 ENCOUNTER — Ambulatory Visit: Payer: Medicare Other | Admitting: Physician Assistant

## 2022-04-11 ENCOUNTER — Telehealth: Payer: Medicare Other

## 2022-04-11 ENCOUNTER — Other Ambulatory Visit: Payer: Self-pay

## 2022-04-11 ENCOUNTER — Telehealth: Payer: Self-pay | Admitting: *Deleted

## 2022-04-11 ENCOUNTER — Emergency Department
Admission: EM | Admit: 2022-04-11 | Discharge: 2022-04-11 | Disposition: A | Discharge status: Home / Self Care | Payer: Medicare Other | Attending: Emergency Medicine | Admitting: Emergency Medicine

## 2022-04-11 DIAGNOSIS — R55 Syncope and collapse: Secondary | ICD-10-CM | POA: Diagnosis present

## 2022-04-11 DIAGNOSIS — M7989 Other specified soft tissue disorders: Secondary | ICD-10-CM | POA: Insufficient documentation

## 2022-04-11 DIAGNOSIS — Z992 Dependence on renal dialysis: Secondary | ICD-10-CM | POA: Diagnosis not present

## 2022-04-11 DIAGNOSIS — N186 End stage renal disease: Secondary | ICD-10-CM | POA: Diagnosis not present

## 2022-04-11 DIAGNOSIS — I120 Hypertensive chronic kidney disease with stage 5 chronic kidney disease or end stage renal disease: Secondary | ICD-10-CM | POA: Insufficient documentation

## 2022-04-11 DIAGNOSIS — R531 Weakness: Secondary | ICD-10-CM | POA: Insufficient documentation

## 2022-04-11 LAB — CBC WITH DIFFERENTIAL/PLATELET
Abs Immature Granulocytes: 0.04 10*3/uL (ref 0.00–0.07)
Basophils Absolute: 0 10*3/uL (ref 0.0–0.1)
Basophils Relative: 0 %
Eosinophils Absolute: 0.1 10*3/uL (ref 0.0–0.5)
Eosinophils Relative: 1 %
HCT: 39.9 % (ref 36.0–46.0)
Hemoglobin: 12.2 g/dL (ref 12.0–15.0)
Immature Granulocytes: 0 %
Lymphocytes Relative: 11 %
Lymphs Abs: 1.2 10*3/uL (ref 0.7–4.0)
MCH: 30.1 pg (ref 26.0–34.0)
MCHC: 30.6 g/dL (ref 30.0–36.0)
MCV: 98.5 fL (ref 80.0–100.0)
Monocytes Absolute: 0.9 10*3/uL (ref 0.1–1.0)
Monocytes Relative: 8 %
Neutro Abs: 8.2 10*3/uL — ABNORMAL HIGH (ref 1.7–7.7)
Neutrophils Relative %: 80 %
Platelets: 128 10*3/uL — ABNORMAL LOW (ref 150–400)
RBC: 4.05 MIL/uL (ref 3.87–5.11)
RDW: 15.4 % (ref 11.5–15.5)
WBC: 10.5 10*3/uL (ref 4.0–10.5)
nRBC: 0 % (ref 0.0–0.2)

## 2022-04-11 LAB — TROPONIN I (HIGH SENSITIVITY)
Troponin I (High Sensitivity): 34 ng/L — ABNORMAL HIGH (ref <18)
Troponin I (High Sensitivity): 40 ng/L — ABNORMAL HIGH (ref <18)

## 2022-04-11 LAB — BASIC METABOLIC PANEL
Anion gap: 15 (ref 5–15)
BUN: 16 mg/dL (ref 8–23)
CO2: 26 mmol/L (ref 22–32)
Calcium: 9.2 mg/dL (ref 8.9–10.3)
Chloride: 95 mmol/L — ABNORMAL LOW (ref 98–111)
Creatinine, Ser: 4.85 mg/dL — ABNORMAL HIGH (ref 0.44–1.00)
GFR, Estimated: 9 mL/min — ABNORMAL LOW (ref >60)
Glucose, Bld: 192 mg/dL — ABNORMAL HIGH (ref 70–99)
Potassium: 3.2 mmol/L — ABNORMAL LOW (ref 3.5–5.1)
Sodium: 136 mmol/L (ref 135–145)

## 2022-04-11 NOTE — ED Provider Notes (Signed)
? ?[facility] ?Provider Note ? ? ? Event Date/Time  ? First MD Initiated Contact with Patient 04/11/22 1719   ?  (approximate) ? ? ?History  ? ?Hypotension ? ? ?HPI ? ?Jacqueline [name] is a 76 y.o. female  who, per clinic note dated 03/25/22 has history of DM, HTN, who presents to the emergency department today because of an episode of weakness and low blood pressure.  The patient does have history of end-stage renal disease and had dialysis earlier in the day.  She states that they drew more fluid off for the normal because of some lower extremity swelling.  As she was being being driven home she started feeling weak.  She then felt like she cannot control her bowels.  When EMS arrived they found her to be quite hypotensive.  They did give 500 cc of fluid.  Patient states she feels better after the fluid. Denies any chest pain, fevers.  ?  ?Physical Exam  ? ?Triage Vital Signs: ?ED Triage Vitals  ?Enc Vitals Group  ?   BP 04/11/22 1655 120/81  ?   Pulse Rate 04/11/22 1655 (!) 107  ?   Resp 04/11/22 1655 18  ?   Temp 04/11/22 1655 97.8 ?F (36.6 ?C)  ?   Temp Source 04/11/22 1655 Axillary  ?   SpO2 --   ?   Weight 04/11/22 1654 239 lb 6.7 oz (108.6 kg)  ?   Height --   ?   Head Circumference --   ?   Peak Flow --   ?   Pain Score 04/11/22 1653 0  ? ?Most recent vital signs: ?Vitals:  ? 04/11/22 1655  ?BP: 120/81  ?Pulse: (!) 107  ?Resp: 18  ?Temp: 97.8 ?F (36.6 ?C)  ? ? ?General: Awake, alert and oriented. ?CV:  Good peripheral perfusion. Regular rate and rhythm. ?Resp:  Normal effort. Lungs clear to auscultation. ?Abd:  No distention.  ? ? ? ?ED Results / Procedures / Treatments  ? ?Labs ?(all labs ordered are listed, but only abnormal results are displayed) ?Labs Reviewed  ?CBC WITH DIFFERENTIAL/PLATELET - Abnormal; Notable for the following components:  ?    Result Value  ? Platelets 128 (*)   ? Neutro Abs 8.2 (*)   ? All other components within normal limits  ?BASIC METABOLIC PANEL -  Abnormal; Notable for the following components:  ? Potassium 3.2 (*)   ? Chloride 95 (*)   ? Glucose, Bld 192 (*)   ? Creatinine, Ser 4.85 (*)   ? GFR, Estimated 9 (*)   ? All other components within normal limits  ?TROPONIN I (HIGH SENSITIVITY) - Abnormal; Notable for the following components:  ? Troponin I (High Sensitivity) 34 (*)   ? All other components within normal limits  ?TROPONIN I (HIGH SENSITIVITY) - Abnormal; Notable for the following components:  ? Troponin I (High Sensitivity) 40 (*)   ? All other components within normal limits  ? ? ? ?EKG ? ?INance Pear, attending physician, personally viewed and interpreted this EKG ? ?EKG Time: 1655 ?Rate: 76 ?Rhythm: sinus rhythm with pac ?Axis: normal ?Intervals: qtc 490 ?QRS: nonspecific IVCD ?ST changes: no st elevation ?Impression: abnormal ekg ? ?RADIOLOGY ?None ? ? ? ?PROCEDURES: ? ?Critical Care performed: No ? ?Procedures ? ? ?MEDICATIONS ORDERED IN ED: ?Medications - No data to display ? ? ?IMPRESSION / MDM / ASSESSMENT AND PLAN / ED COURSE  ?I reviewed the triage vital signs  and the nursing notes. ?             ?               ? ?Differential diagnosis includes, but is not limited to, anemia, electrolyte abnormality, hypovolemia. ? ?Patient presents to the emergency department today after a near syncopal episode.  Patient did have dialysis today and states they drew more fluid off than normal.  By the time of my exam she stated she felt better after the fluid bolus by EMS.  Did check blood work here without any concerning anemia.  Slightly low potassium level which is not unexpected after dialysis.  Troponin was checked and while elevated is within the range that is patient's baseline.  Patient did not have any chest pain so I do think ACS is unlikely.  Patient continued to feel better here in the emergency department.  Given reassuring work-up and the fact that the patient clinically felt better I do not feel patient necessitates inpatient  mission at this time. ? ? ?FINAL CLINICAL IMPRESSION(S) / ED DIAGNOSES  ? ?Final diagnoses:  ?Syncope, unspecified syncope type  ? ? ? ?Note:  This document was prepared using Dragon voice recognition software and may include unintentional dictation errors. ? ?  ?Nance Pear, MD ?04/11/22 2057 ? ?

## 2022-04-11 NOTE — ED Notes (Signed)
Pt alert and oriented, speaking in full sentences, breathing easy and unlabored. ?

## 2022-04-11 NOTE — ED Triage Notes (Signed)
Pt here from dialysis after having too much fluid removed today due to her legs being swollen. Pt went home to eat and had a loss of bowels. Pt fingertips cold to touch, oxygen saturation not able to read with ems. Pt bp 70/50 manually with ems, 500cc of fluid given, bp increased to 80/60. Hx of vein disease, renal failure, and CHF. ? ? ?199-cbg ?46 ? ?

## 2022-04-11 NOTE — Discharge Instructions (Signed)
Please seek medical attention for any high fevers, chest pain, shortness of breath, change in behavior, persistent vomiting, bloody stool or any other new or concerning symptoms.  

## 2022-04-11 NOTE — Chronic Care Management (AMB) (Signed)
  Care Coordination Note  04/11/2022 Name: [name] MRN: [id number] DOB: 1946-03-14  Jacqueline [name] is a 76 y.o. year old female who is a primary care patient of [name], [place] and is actively engaged with the care management team. I reached out to Jacqueline [name] by phone today to assist with re-scheduling a follow up visit with the BSW  Follow up plan: Unsuccessful telephone outreach attempt made. A HIPAA compliant phone message was left for the patient providing contact information and requesting a return call.  The care management team will reach out to the patient again over the next 7 days.  If patient returns call to provider office, please advise to call [place]  at [phone].  [place] Management  Direct Dial: [phone]

## 2022-04-11 NOTE — ED Notes (Signed)
Received report from Elana RN. 

## 2022-04-12 ENCOUNTER — Telehealth: Payer: Self-pay

## 2022-04-12 NOTE — Telephone Encounter (Signed)
Transition Care Management Unsuccessful Follow-up Telephone Call ? ?Date of discharge and from where:  04/11/2022 [facility]  ? ?Attempts:  1st Attempt ? ?Reason for unsuccessful TCM follow-up call:  Left voice message ? ?  ?

## 2022-04-12 NOTE — Telephone Encounter (Signed)
Chmg-error.  

## 2022-04-16 ENCOUNTER — Telehealth: Payer: Self-pay

## 2022-04-16 NOTE — Telephone Encounter (Signed)
Spoke with patient after her visit to the ED on 04/11/2022. Pt reports she is fine and she does not need an appointment at this time. Patient aware, to give the office a call if she has any question , concerns or if she would like an appointment.  ?

## 2022-04-18 NOTE — Chronic Care Management (AMB) (Signed)
  Care Coordination Note  04/18/2022 Name: Jacqueline [name] MRN: [id number] DOB: [birth year]  Jacqueline [name] is a 76 y.o. year old female who is a primary care patient of [name], [place] and is actively engaged with the care management team. I reached out to Jacqueline [name] by phone today to assist with re-scheduling a follow up visit with the BSW  Follow up plan: Unsuccessful telephone outreach attempt made. A HIPAA compliant phone message was left for the patient providing contact information and requesting a return call.  The care management team will reach out to the patient again over the next 7 days.  If patient returns call to provider office, please advise to call [place] at [phone].  [place] Management  Direct Dial: [phone]

## 2022-04-19 ENCOUNTER — Other Ambulatory Visit: Payer: Self-pay | Admitting: Nurse Practitioner

## 2022-04-19 ENCOUNTER — Ambulatory Visit: Payer: Medicare Other | Admitting: Nurse Practitioner

## 2022-04-19 ENCOUNTER — Other Ambulatory Visit: Payer: Self-pay | Admitting: Cardiology

## 2022-04-19 NOTE — Progress Notes (Deleted)
Office Visit    Patient Name: Jacqueline [name] Date of Encounter: 04/19/2022  Primary Care Provider:  [name], FNP Primary Cardiologist:  Fransico Him, MD  Chief Complaint    76 year old female with a history of CAD, hypertension, hyperlipidemia, ESRD on HD, hypotension, asymptomatic bradycardia, type 2 diabetes, obesity hypoventilation syndrome, and GERD who presents for follow-up related to hypotension/presyncope.  Past Medical History    Past Medical History:  Diagnosis Date   Arthritis    "left knee" (12/04/2016)   CAD (coronary artery disease)    a. 12/2016 NSTEMI/PCI: LM nl, LaD 20p/d, D1 20, OM2 20, OM3 20, RCA 26m (3.0x18 Resolute DES). EF 65%.   Chronic lower back pain    Diverticulosis    on CT   ESRD on dialysis [facility])    "[place]; [place]; TTS" (12/04/2016)   GERD (gastroesophageal reflux disease)    Hiatal hernia    Hyperlipidemia    Hypertension    a. 12/2016 labetalol d/c'd 2/2 hypotension in HD.   Morbid obesity ([place])    Obesity    Pneumonia    "couple times" (12/04/2016)   Renal insufficiency    Type II diabetes mellitus ([place])    Past Surgical History:  Procedure Laterality Date   ABDOMINAL HYSTERECTOMY     AV FISTULA PLACEMENT Left 01/27/2014   Procedure: ARTERIOVENOUS (AV) FISTULA CREATION;  Surgeon: Mal Misty, MD;  Location: [facility];  Service: Vascular;  Laterality: Left;   AV FISTULA REPAIR     "had it cleaned out"   CARDIAC CATHETERIZATION  ?[place] N/A 12/04/2016   Procedure: Left Heart Cath Angiography;  Surgeon: Burnell Blanks, MD;  Location: [facility] CV [facility];  Service: Cardiovascular;  Laterality: N/A;   CARDIAC CATHETERIZATION N/A 12/04/2016   Procedure: Coronary Stent Intervention;  Surgeon: Burnell Blanks, MD;  Location: [facility];  Service: Cardiovascular;  Laterality: N/A;  Mid RCA   CATARACT EXTRACTION W/ INTRAOCULAR LENS IMPLANT Left    COLONOSCOPY W/ BIOPSIES AND  POLYPECTOMY     CORONARY ANGIOPLASTY WITH STENT PLACEMENT  12/04/2016   ESOPHAGOGASTRODUODENOSCOPY     INSERTION OF DIALYSIS CATHETER N/A 01/19/2014   Procedure: INSERTION OF DIALYSIS CATHETER;  Surgeon: Rosetta Posner, MD;  Location: [facility] OR;  Service: Vascular;  Laterality: N/A;    Allergies  Allergies  Allergen Reactions   Other Other (See Comments)   Sulfa Antibiotics Other (See Comments)   Sulfa Drugs Cross Reactors Other (See Comments)    Doesn't remember     History of Present Illness    76 year old female with the above past medical history including CAD, hypertension, hyperlipidemia, hypotension, asymptomatic bradycardia, ESRD on HD, type 2 diabetes, obesity hypoventilation syndrome, and GERD.  She has a history of CAD s/p NSTEMI in 2018, DES-RCA.  She has a history of hypotension with hemodialysis.  Echocardiogram in July 2020 showed EF 60 to 65%, mild asymmetric LVH, moderate mitral annular calcification, mild thickening of aortic valve.  She was hospitalized in July 2021 due to hypotension, BP dropped into the 70s.  She was last seen in the office on 01/19/2020 and was stable overall from a cardiac standpoint.  She presented to the ED on 04/11/2022 with generalized weakness, hypotension, near syncope following HD earlier that day.  Potassium was slightly low, otherwise, blood work was unremarkable. She does have chronic mildly elevated troponin. Should her symptoms improved following fluid bolus.  No further work-up was recommended.  She was discharged home in stable condition same day.  She presents today for follow-up.  Since her ED visit  Hypotension/pre-syncope: CAD: Hypertension: Hyperlipidemia: ESRD on HD: Type 2 Diabetes: Disposition: Home Medications    Current Outpatient Medications  Medication Sig Dispense Refill   acetaminophen (TYLENOL) 500 MG tablet Take 1,000 mg by mouth every 6 (six) hours as needed for moderate pain or headache.     albuterol (VENTOLIN HFA) 108  (90 Base) MCG/ACT inhaler Inhale 1-2 puffs into the lungs every 6 (six) hours as needed for wheezing or shortness of breath. 1 each 0   aspirin EC 81 MG EC tablet Take 1 tablet (81 mg total) by mouth daily.     AURYXIA 1 GM 210 MG(Fe) tablet Take 420 mg by mouth 2 (two) times a day.     B Complex-C-Folic Acid (DIALYVITE 419) 0.8 MG TABS Take 1 tablet by mouth daily.     B Complex-C-Zn-Folic Acid (DIALYVITE/ZINC) TABS Take 1 tablet by mouth daily.     cinacalcet (SENSIPAR) 30 MG tablet Take 30 mg by mouth daily.     Doxercalciferol (HECTOROL IV) Dialysis Tuesday,Thursday and saturday     ezetimibe (ZETIA) 10 MG tablet TAKE 1 TABLET BY MOUTH  DAILY  OVERDUE FOLLOW UP  APPOINTMENT NEEDED FOR  FURTHER REFILLS. (Patient taking differently: Take 10 mg by mouth daily.) 15 tablet 0   gabapentin (NEURONTIN) 100 MG capsule Take 1 capsule (100 mg total) by mouth daily. 30 capsule 2   metoCLOPramide (REGLAN) 10 MG tablet TAKE 1 TABLET BY MOUTH  TWICE DAILY 1/2 HOUR BEFORE MEALS 120 tablet 0   midodrine (PROAMATINE) 10 MG tablet Take 10 mg by mouth See admin instructions. Dialysis days [place] Saturday     neomycin-polymyxin-hydrocortisone (CORTISPORIN) OTIC solution 3 drops 4 (four) times daily.     Omega-3 Fatty Acids (FISH OIL) 1000 MG CPDR Take 1,000 mg by mouth daily.      omeprazole (PRILOSEC) 20 MG capsule Take 1 capsule (20 mg total) by mouth daily. 90 capsule 1   ondansetron (ZOFRAN) 4 MG tablet Take 4 mg by mouth daily.     rosuvastatin (CRESTOR) 40 MG tablet Take 1 tablet (40 mg total) by mouth daily. Please schedule appointment for future refills. 3rd & FINAL attempt! 15 tablet 0   Vitamin D, Ergocalciferol, 50 MCG (2000 UT) CAPS Take 2,000 Units by mouth daily.     No current facility-administered medications for this visit.     Review of Systems    ***.  All other systems reviewed and are otherwise negative except as noted above.    Physical Exam    VS:  There were no vitals taken  for this visit. , BMI There is no height or weight on file to calculate BMI.     GEN: Well nourished, well developed, in no acute distress. HEENT: normal. Neck: Supple, no JVD, carotid bruits, or masses. Cardiac: RRR, no murmurs, rubs, or gallops. No clubbing, cyanosis, edema.  Radials/DP/PT 2+ and equal bilaterally.  Respiratory:  Respirations regular and unlabored, clear to auscultation bilaterally. GI: Soft, nontender, nondistended, BS + x 4. MS: no deformity or atrophy. Skin: warm and dry, no rash. Neuro:  Strength and sensation are intact. Psych: Normal affect.  Accessory Clinical Findings    ECG personally reviewed by me today - *** - no acute changes.  Lab Results  Component Value Date   WBC 10.5 04/11/2022   HGB 12.2 04/11/2022   HCT 39.9 04/11/2022  MCV 98.5 04/11/2022   PLT 128 (L) 04/11/2022   Lab Results  Component Value Date   CREATININE 4.85 (H) 04/11/2022   BUN 16 04/11/2022   NA 136 04/11/2022   K 3.2 (L) 04/11/2022   CL 95 (L) 04/11/2022   CO2 26 04/11/2022   Lab Results  Component Value Date   ALT 15 02/12/2022   AST 29 02/12/2022   ALKPHOS 79 02/12/2022   BILITOT 0.6 02/12/2022   Lab Results  Component Value Date   CHOL 142 10/10/2021   HDL 46 10/10/2021   LDLCALC 73 10/10/2021   TRIG 133 10/10/2021   CHOLHDL 3.1 10/10/2021    Lab Results  Component Value Date   HGBA1C 6.9 (H) 10/10/2021    Assessment & Plan    1.  ***   Lenna Sciara, NP 04/19/2022, 5:45 AM

## 2022-04-22 ENCOUNTER — Telehealth: Payer: Medicare Other

## 2022-04-22 ENCOUNTER — Ambulatory Visit: Payer: Self-pay

## 2022-04-22 NOTE — Patient Instructions (Signed)
Visit Information   Thank you for taking time to visit with me today. Please don't hesitate to contact me if I can be of assistance to you before our next scheduled telephone appointment.  Following are the goals we discussed today:  (Copy and paste patient goals from clinical care plan here)  Our next appointment is by telephone on 05/20/22 at 11:15 AM   Please call the care guide team at [phone] if you need to cancel or reschedule your appointment.   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   Priority: High     Long-Range Goal: Establishment of plan of care for management of chronic disease states Type 2 diabetes mellitus with end-stage renal disease, 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   Start Date: 04/03/2022  Expected End Date: 04/04/2023  Recent Progress: On track  Priority: High  Note:   Current Barriers:  Knowledge Deficits related to plan of care for management of Type 2 diabetes mellitus with end-stage renal disease, 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   Care Coordination needs related to Level of care concerns, Inability to perform ADL's independently, and Inability to perform IADL's independently Chronic Disease Management support and education needs related to Type 2 diabetes mellitus with end-stage renal disease, 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    RNCM Clinical Goal(s):  Patient will continue to work with RN Care  Manager to address care management and care coordination needs related to  Type 2 diabetes mellitus with end-stage renal disease, 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   Interventions: 1:1 collaboration with primary care provider regarding development and update of comprehensive plan of care as evidenced by provider attestation and co-signature Inter-disciplinary care team collaboration (see longitudinal plan of care) Evaluation of current treatment plan related to  self management and patient's adherence to plan as established by provider  Interdisciplinary Collaboration Interventions:  (Status: Goal on track:  Yes.) Short Term Goal   Collaborated with BSW to initiate plan of care to address needs related to Level of care concerns, Inability to perform ADL's independently, and Inability to perform IADL's independently in patient with  Type 2 diabetes mellitus with end-stage renal disease, 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  Collaboration with [name], FNP regarding development and update of comprehensive plan of care as evidenced by provider attestation and co-signature Inter-disciplinary care team collaboration     End Stage Renal Disease Interventions:  (Status:  New goal.)  Long Term Goal Assessed the Patient understanding of end stage renal disease    Evaluation of current treatment plan related to chronic kidney  disease self management and patient's adherence to plan as established by provider      Determined patient receive hemodialysis times 10 years at the [facility] dialysis centr in [place] on Tuesday, Thursday and Saturday, her family is providing transportation Discussed recent ED event following her HD treatment due to having too much fluid removed during her treatment, she recovered quickly after receiving 500 cc bolus of normal saline  Reviewed prescribed renal diet/fluid restrictions as directed  Educated patient on how to feel for a thrill daily to check for patency of her left upper AVF, educated patient to notify her dialysis center promptly if she does not feel the blood flowing through her fistula Educated patient on importance to avoid lying on this arm, lifting heavy objects, avoid having blood drawn or BP taken in this arm  Discussed the impact of chronic kidney disease on daily life and mental health and acknowledged and normalized feelings of disempowerment, fear, and frustration    Last practice recorded BP readings:  BP Readings from Last 3 Encounters:  04/11/22 (!) 103/47  03/25/22 110/64  03/03/22 (!) 95/50  Most recent eGFR/CrCl:  Lab Results  Component Value Date   EGFR 5 (L) 10/10/2021    No components found for: CRCL   Diabetes Interventions:  (Status:  New goal.) Long Term Goal Assessed patient's understanding of A1c goal: <6.5% Provided education to patient about basic DM disease process; Educated patient while providing rationale the importance to keep A1c under 6.5% in order to help prevent diabetes complications  Counseled on importance of regular laboratory monitoring as prescribed Review of patient status, including review of consultants reports, relevant laboratory and other test results, and medications completed Lab Results  Component Value Date   HGBA1C 6.9 (H) 10/10/2021   Hypertension Interventions:  (Status:  New goal.) Long Term Goal Last practice  recorded BP readings:  BP Readings from Last 3 Encounters:  04/11/22 (!) 103/47  03/25/22 110/64  03/03/22 (!) 95/50  Most recent eGFR/CrCl:  Lab Results  Component Value Date   EGFR 5 (L) 10/10/2021    No components found for: CRCL Evaluation of current treatment plan related to hypertension self management and patient's adherence to plan as established by provider Determined patient is experiencing hypotensive episodes with her HD treatments, she feels this has improved since she began eating before her treatment Advised patient, providing education and rationale, to monitor blood pressure daily and record, calling PCP for findings outside established parameters Advised patient to discuss syncope or persistent low BP with provider Determined patient is concerned about her lower extremity edema Determined patient has not followed up with her Cardiologist in 2 years Reviewed and discussed upcoming Cardiology visit with Dr. Fletcher Anon scheduled for 04/26/22 '@10' :20 AM , encouraged patient to ask the Cardiologist about Compression stockings and refills for lipid lowering medications Reviewed medications with patient and discussed importance of medication adherence Educated patient while providing rationale on the importance of performing daily inspections of her feet/legs and to report abnormal findings promptly, educated on the importance of keeping legs elevated above heart level while resting  Assessed social determinant of health barriers    Grief Interventions:  (Status:  New goal.)  Long Term Goal Evaluation of current treatment plan related to Depression/Grief self-management and patient's adherence to plan as established by provider Determined patient lost her husband on  04/02/22 suddenly after he experienced a fall and stayed a short time in the hospital and rehab Discussed patient's spouse was her caregiver prior to his death, she is now living with her son and his family in  [place] Discussed she has a strong support system at home and declines needing grief counseling at this time  Provided active listening to patient and validated her feelings of despair for the loss of her spouse Encouraged patient to notify her PCP if her feeling of depression and grief worsen Discussed plans with patient for ongoing care management follow up and provided patient with direct contact information for care management team   Patient Goals/Self-Care Activities: Take all medications as prescribed Attend all scheduled provider appointments Call pharmacy for medication refills 3-7 days in advance of running out of medications Perform all self care activities independently  Call provider office for new concerns or questions  check feet daily for cuts, sores or redness manage portion size wash and dry feet carefully every day check blood pressure 3 times per week report new symptoms to your doctor take all medications exactly as prescribed  Follow Up Plan:  Initial RN Care manager telephone follow up call scheduled with patient/caregiver for: /23     Consent to CCM Services: Ms. Heatwole was given information about Chronic Care Management services including:  CCM service includes personalized support from designated clinical staff supervised by her physician, including individualized plan of care and coordination with other care providers 24/7 contact phone numbers for assistance for urgent and routine care needs. Service will only be billed when office clinical staff spend 20 minutes or more in a month to coordinate care. Only one practitioner may furnish and bill the service in a calendar month. The patient may stop CCM services at any time (effective at the end of the month) by phone call to the office staff. The patient will be responsible for cost sharing (co-pay) of up to 20% of the service fee (after annual deductible is met).  Patient agreed to services and  verbal consent obtained.   Patient verbalizes understanding of instructions and care plan provided today and agrees to view in [facility]. Active MyChart status and patient understanding of how to access instructions and care plan via MyChart confirmed with patient.     Telephone follow up appointment with care management team member scheduled for: 05/20/22

## 2022-04-22 NOTE — Chronic Care Management (AMB) (Signed)
Care Management    RN Visit Note  04/22/2022 Name: Jacqueline [name] MRN: [id number] DOB: Jul 31, 1946  Subjective: Jacqueline [name] is a 76 y.o. year old female who is a primary care patient of [name], [place]. The care management team was consulted for assistance with disease management and care coordination needs.    Engaged with patient by telephone for follow up visit in response to provider referral for case management and/or care coordination services.   Consent to Services:   [name] was given information about Care Management services today including:  Care Management services includes personalized support from designated clinical staff supervised by her physician, including individualized plan of care and coordination with other care providers 24/7 contact phone numbers for assistance for urgent and routine care needs. The patient may stop case management services at any time by phone call to the office staff.  Patient agreed to services and consent obtained.   Assessment: Review of patient past medical history, allergies, medications, health status, including review of consultants reports, laboratory and other test data, was performed as part of comprehensive evaluation and provision of chronic care management services.   SDOH (Social Determinants of Health) assessments and interventions performed:  yes, no acute challenges   Care Plan  Allergies  Allergen Reactions   Other Other (See Comments)   Sulfa Antibiotics Other (See Comments)   Sulfa Drugs Cross Reactors Other (See Comments)    Doesn't remember     Outpatient Encounter Medications as of 04/22/2022  Medication Sig   acetaminophen (TYLENOL) 500 MG tablet Take 1,000 mg by mouth every 6 (six) hours as needed for moderate pain or headache.   albuterol (VENTOLIN HFA) 108 (90 Base) MCG/ACT inhaler Inhale 1-2 puffs into the lungs every 6 (six) hours as needed for wheezing or shortness of breath.   aspirin EC  81 MG EC tablet Take 1 tablet (81 mg total) by mouth daily.   AURYXIA 1 GM 210 MG(Fe) tablet Take 420 mg by mouth 2 (two) times a day.   B Complex-C-Folic Acid (DIALYVITE 696) 0.8 MG TABS Take 1 tablet by mouth daily.   B Complex-C-Zn-Folic Acid (DIALYVITE/ZINC) TABS Take 1 tablet by mouth daily.   cinacalcet (SENSIPAR) 30 MG tablet Take 30 mg by mouth daily.   Doxercalciferol (HECTOROL IV) Dialysis Tuesday,Thursday and saturday   ezetimibe (ZETIA) 10 MG tablet TAKE 1 TABLET BY MOUTH  DAILY  OVERDUE FOLLOW UP  APPOINTMENT NEEDED FOR  FURTHER REFILLS. (Patient taking differently: Take 10 mg by mouth daily.)   gabapentin (NEURONTIN) 100 MG capsule Take 1 capsule (100 mg total) by mouth daily.   metoCLOPramide (REGLAN) 10 MG tablet TAKE 1 TABLET BY MOUTH TWICE  DAILY 1/2 HOUR BEFORE MEALS   midodrine (PROAMATINE) 10 MG tablet Take 10 mg by mouth See admin instructions. Dialysis days [place] Saturday   neomycin-polymyxin-hydrocortisone (CORTISPORIN) OTIC solution 3 drops 4 (four) times daily.   Omega-3 Fatty Acids (FISH OIL) 1000 MG CPDR Take 1,000 mg by mouth daily.    omeprazole (PRILOSEC) 20 MG capsule Take 1 capsule (20 mg total) by mouth daily.   ondansetron (ZOFRAN) 4 MG tablet Take 4 mg by mouth daily.   rosuvastatin (CRESTOR) 40 MG tablet TAKE 1 TABLET BY MOUTH  DAILY   Vitamin D, Ergocalciferol, 50 MCG (2000 UT) CAPS Take 2,000 Units by mouth daily.   No facility-administered encounter medications on file as of 04/22/2022.    Patient Active Problem List   Diagnosis Date  Noted   Anaphylactic shock, unspecified, initial encounter 06/29/2020   Sepsis ([place]) 06/22/2019   Pain due to onychomycosis of toenails of both feet 06/11/2019   Callus 06/11/2019   Other disorders of phosphorus metabolism 05/20/2019   Mixed hyperlipidemia 03/22/2019   Otalgia of both ears 03/22/2019   Right arm pain 03/22/2019   Bradycardia 10/01/2017   Dyslipidemia 10/01/2017   Dependence on renal dialysis  ([place]) 09/01/2017   Chest pain, unspecified 12/06/2016   Non-ST elevation (NSTEMI) myocardial infarction ([place]) 12/06/2016   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   History of NSTEMI    CAD S/P percutaneous coronary angioplasty 12/03/2016   Hypokalemia 05/03/2016   Pruritus, unspecified 03/27/2015   Encounter for removal of sutures 11/30/2014   Pain in lower limb 08/26/2014   Encounter for immunization 06/10/2014   Unspecified protein-calorie malnutrition ([place]) 02/28/2014   Allergy, unspecified, initial encounter 02/15/2014   HCAP (healthcare-associated pneumonia) 02/13/2014   Absence of both cervix and uterus, acquired 02/01/2014   Coagulation defect, unspecified ([place]) 02/01/2014   Dyspnea, unspecified 02/01/2014   Family history of diabetes mellitus 02/01/2014   Family history of ischemic heart disease and other diseases of the circulatory system 02/01/2014   Fever, unspecified 02/01/2014   Headache, unspecified 02/01/2014   History of allergy to sulfonamides 02/01/2014   Iron deficiency anemia, unspecified 02/01/2014   Long term (current) use of insulin ([place]) 02/01/2014   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   OSA (obstructive sleep apnea) 01/21/2014   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[place]) 01/21/2014   Acute encephalopathy 01/19/2014   Congestive heart failure ([place]) 01/15/2014   ESRD (end stage renal disease) on dialysis ([place]) 01/15/2014   Hypertension 01/15/2014   Diabetes mellitus ([place]) 01/15/2014   Anemia 01/15/2014   Onychomycosis 03/26/2013   Pain in joint, ankle and foot 03/26/2013    Conditions to be addressed/monitored:  Type 2 diabetes mellitus with end-stage renal disease, 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  Care Plan : RN Care Manager Plan of Care  Updates made by Lynne Logan, RN since 04/22/2022 12:00 AM     Problem: No plan of care established for management of chronic disease states Type 2 diabetes mellitus  with end-stage renal disease, 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   Priority: High     Long-Range Goal: Establishment of plan of care for management of chronic disease states Type 2 diabetes mellitus with end-stage renal disease, 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   Start Date: 04/03/2022  Expected End Date: 04/04/2023  Recent Progress: On track  Priority: High  Note:   Current Barriers:  Knowledge Deficits related to plan of care for management of Type 2 diabetes mellitus with end-stage renal disease, 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   Care Coordination needs related to Level of care concerns, Inability to perform ADL's independently, and Inability to perform IADL's independently Chronic Disease Management support and education needs related to Type 2 diabetes mellitus with end-stage renal disease, 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    RNCM Clinical Goal(s):  Patient will continue to work with RN Care Manager to address care management and care coordination needs related to  Type 2 diabetes mellitus with end-stage renal disease, 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   Interventions: 1:1 collaboration with primary care provider regarding development and update of comprehensive plan of care as evidenced by provider attestation and  co-signature Inter-disciplinary care team collaboration (see longitudinal plan of care) Evaluation of current treatment plan related to  self management and patient's adherence to plan as established by provider  Interdisciplinary Collaboration Interventions:  (Status: Goal on track:  Yes.) Short Term Goal   Collaborated with BSW to initiate plan of care to address needs related to Level of care concerns, Inability to perform ADL's independently, and Inability to perform IADL's independently in patient with  Type 2 diabetes mellitus with end-stage renal disease, 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  Collaboration with [name], FNP regarding development and update of comprehensive plan of care as evidenced by provider attestation and co-signature Inter-disciplinary care team collaboration     End Stage Renal Disease Interventions:  (Status:  New goal.) Long Term Goal Assessed the Patient understanding of end stage renal disease    Evaluation of current treatment plan related to chronic kidney disease self management and patient's adherence to plan as established by provider      Determined patient receive hemodialysis times 10 years at the [facility] dialysis centr in [place] on Tuesday, Thursday and Saturday, her family is providing transportation Discussed recent ED event following her HD treatment due to having too much fluid removed during her treatment, she recovered quickly after receiving 500 cc bolus of normal saline  Reviewed prescribed renal diet/fluid restrictions as directed  Educated patient on how to feel for a thrill daily to check for patency of her left upper AVF, educated patient to notify her dialysis center promptly if she does not feel the blood flowing through her fistula Educated patient on importance to avoid lying on this arm, lifting heavy objects, avoid having blood drawn or BP taken in this arm  Discussed the impact of chronic kidney  disease on daily life and mental health and acknowledged and normalized feelings of disempowerment, fear, and frustration    Last practice recorded BP readings:  BP Readings from Last 3 Encounters:  04/11/22 (!) 103/47  03/25/22 110/64  03/03/22 (!) 95/50  Most recent eGFR/CrCl:  Lab Results  Component Value Date   EGFR 5 (L) 10/10/2021    No components found for: CRCL   Diabetes Interventions:  (Status:  New goal.) Long Term Goal Assessed patient's understanding of A1c goal: <6.5% Provided education to patient about basic DM disease process; Educated patient while providing rationale the importance to keep A1c under 6.5% in order to help prevent diabetes complications  Counseled on importance of regular laboratory monitoring as prescribed Review of patient status, including review of consultants reports, relevant laboratory and other test results, and medications completed Lab Results  Component Value Date   HGBA1C 6.9 (H) 10/10/2021   Hypertension Interventions:  (Status:  New goal.) Long Term Goal Last practice recorded BP readings:  BP Readings from Last 3 Encounters:  04/11/22 (!) 103/47  03/25/22 110/64  03/03/22 (!) 95/50  Most recent eGFR/CrCl:  Lab Results  Component Value Date   EGFR 5 (L) 10/10/2021    No components found for: CRCL  Evaluation of current treatment plan related to hypertension self management and patient's adherence to plan as established by provider Determined patient is experiencing hypotensive episodes with her HD treatments, she feels this has improved since she began eating before her treatment Advised patient, providing education and rationale, to monitor blood pressure daily and record, calling PCP for findings outside established parameters Advised patient to discuss syncope or persistent low BP with provider Determined patient is concerned about her lower extremity edema Determined patient has not followed up with her Cardiologist in 2  years Reviewed and discussed upcoming Cardiology visit with Dr. Fletcher Anon scheduled for 04/26/22 _0 :20 AM , encouraged patient to ask the Cardiologist about Compression stockings and refills for lipid lowering medications Reviewed medications with patient and discussed importance of medication adherence Educated patient while providing rationale on the importance of performing daily inspections of her feet/legs and to report abnormal findings promptly, educated on the importance of keeping legs elevated above heart level while resting  Assessed social determinant of health barriers    Grief Interventions:  (Status:  New goal.)  Long Term Goal Evaluation of current treatment plan related to Depression/Grief self-management and patient's adherence to plan as established by provider Determined patient lost her husband on 04/02/22 suddenly after he experienced a fall and stayed a short time in the hospital and rehab Discussed patient's spouse was her caregiver prior to his death, she is now living with her son and his family in [place] Discussed she has a strong support system at home and declines needing grief counseling at this time  Provided active listening to patient and validated her feelings of despair for the loss of her spouse Encouraged patient to notify her PCP if her feeling of depression and grief worsen Discussed plans with patient for ongoing care management follow up and provided patient with direct contact information for care management team   Patient Goals/Self-Care Activities: Take all medications as prescribed Attend all scheduled provider appointments Call pharmacy for medication refills 3-7 days in advance of running out of medications Perform all self care activities independently  Call provider office for new concerns or questions  check feet daily for cuts, sores or redness manage portion size wash and dry feet carefully every day check blood pressure 3 times per  week report new symptoms to your doctor take all medications exactly as prescribed  Follow Up Plan:  Initial RN Care manager telephone follow up call scheduled with patient/caregiver for: [place], RN, BSN, CCM Care Management Coordinator [facility]  Direct Phone: [phone]

## 2022-04-25 NOTE — Chronic Care Management (AMB) (Signed)
  Care Coordination Note  04/25/2022 Name: Jacqueline [name] MRN: [id number] DOB: Jun 08, 1946  Jacqueline [name] is a 76 y.o. year old female who is a primary care patient of [name], [place] and is actively engaged with the care management team. I reached out to Jacqueline [name] by phone today to assist with re-scheduling a follow up visit with the BSW  Follow up plan: A third unsuccessful telephone outreach attempt made. A HIPAA compliant phone message was left for the patient providing contact information and requesting a return call. Unable to make contact on outreach attempts x 3. PCP [name], FNP notified via routed documentation in medical record. We have been unable to make contact with the patient for follow up. The care management team is available to follow up with the patient after provider conversation with the patient regarding recommendation for care management engagement and subsequent re-referral to the care management team. If patient returns call to provider office, please advise to call [place] at [phone].   [facility]  Direct Dial: [phone]

## 2022-04-26 ENCOUNTER — Encounter: Payer: Self-pay | Admitting: Cardiovascular Disease

## 2022-04-26 ENCOUNTER — Ambulatory Visit: Payer: Medicare Other | Admitting: Cardiovascular Disease

## 2022-04-26 VITALS — BP 90/50 | HR 68 | Ht 64.0 in | Wt 229.0 lb

## 2022-04-26 DIAGNOSIS — Z9861 Coronary angioplasty status: Secondary | ICD-10-CM | POA: Diagnosis not present

## 2022-04-26 DIAGNOSIS — I2510 Atherosclerotic heart disease of native coronary artery without angina pectoris: Secondary | ICD-10-CM | POA: Diagnosis not present

## 2022-04-26 DIAGNOSIS — I739 Peripheral vascular disease, unspecified: Secondary | ICD-10-CM | POA: Diagnosis not present

## 2022-04-26 DIAGNOSIS — E785 Hyperlipidemia, unspecified: Secondary | ICD-10-CM | POA: Diagnosis not present

## 2022-04-26 MED ORDER — EZETIMIBE 10 MG PO TABS: 10.0000 mg | ORAL_TABLET | Freq: Every day | ORAL | 5 refills | Status: DC

## 2022-04-26 MED ORDER — ROSUVASTATIN CALCIUM 40 MG PO TABS: 40.0000 mg | ORAL_TABLET | Freq: Every day | ORAL | 5 refills | Status: DC

## 2022-04-26 NOTE — Progress Notes (Signed)
Cardiology Office Note   Date:  04/26/2022   ID:  Jacqueline [name], DOB August 09, 1946, MRN [id number]  PCP:  Jacqueline Brine, FNP  Cardiologist:  Dr. Radford Pax  Chief Complaint  Patient presents with   Advice Only    positive arterial studies      History of Present Illness: Jacqueline [name] is a 76 y.o. female who presents for evaluation management of peripheral arterial disease. She has known history of coronary artery disease with previous non-ST elevation myocardial infarction in 2018 treated with drug-eluting stent placement to the right coronary artery, end-stage renal disease on hemodialysis, type 2 diabetes, essential hypertension, hyperlipidemia and obesity.  She had issues with hypotension during dialysis. She was seen recently by Dr. Bjorn Loser in podiatry for bilateral foot pain worse on the left side.  The pain in the left side happens mostly at night and improves when she hangs her legs down.  Her functional capacity is overall poor and she walks slowly with a walker.  Most of her claudication symptoms are at night. She underwent noninvasive vascular studies recently which showed noncompressible vessels on the right and an ABI 0.6 on the left.  Duplex showed no significant infrainguinal disease but she was noted to have monophasic waveform in the left common femoral artery suggestive of inflow disease.  No chest pain but does have chronic exertional dyspnea.  Past Medical History:  Diagnosis Date   Arthritis    "left knee" (12/04/2016)   CAD (coronary artery disease)    a. 12/2016 NSTEMI/PCI: LM nl, LaD 20p/d, D1 20, OM2 20, OM3 20, RCA 57m (3.0x18 Resolute DES). EF 65%.   Chronic lower back pain    Diverticulosis    on CT   ESRD on dialysis [facility])    "[place]; [place]; TTS" (12/04/2016)   GERD (gastroesophageal reflux disease)    Hiatal hernia    Hyperlipidemia    Hypertension    a. 12/2016 labetalol d/c'd 2/2 hypotension in HD.   Morbid obesity ([place])     Obesity    Pneumonia    "couple times" (12/04/2016)   Renal insufficiency    Type II diabetes mellitus ([place])     Past Surgical History:  Procedure Laterality Date   ABDOMINAL HYSTERECTOMY     AV FISTULA PLACEMENT Left 01/27/2014   Procedure: ARTERIOVENOUS (AV) FISTULA CREATION;  Surgeon: Mal Misty, MD;  Location: [place];  Service: Vascular;  Laterality: Left;   AV FISTULA REPAIR     "had it cleaned out"   CARDIAC CATHETERIZATION  ?[place] N/A 12/04/2016   Procedure: Left Heart Cath Angiography;  Surgeon: Burnell Blanks, MD;  Location: [place] CV [facility];  Service: Cardiovascular;  Laterality: N/A;   CARDIAC CATHETERIZATION N/A 12/04/2016   Procedure: Coronary Stent Intervention;  Surgeon: Burnell Blanks, MD;  Location: [place] [facility];  Service: Cardiovascular;  Laterality: N/A;  Mid RCA   CATARACT EXTRACTION W/ INTRAOCULAR LENS IMPLANT Left    COLONOSCOPY W/ BIOPSIES AND POLYPECTOMY     CORONARY ANGIOPLASTY WITH STENT PLACEMENT  12/04/2016   ESOPHAGOGASTRODUODENOSCOPY     INSERTION OF DIALYSIS CATHETER N/A 01/19/2014   Procedure: INSERTION OF DIALYSIS CATHETER;  Surgeon: Rosetta Posner, MD;  Location[facility] OR;  Service: Vascular;  Laterality: N/A;     Current Outpatient Medications  Medication Sig Dispense Refill   acetaminophen (TYLENOL) 500 MG tablet Take 1,000 mg by mouth every 6 (six) hours as needed for moderate  pain or headache.     albuterol (VENTOLIN HFA) 108 (90 Base) MCG/ACT inhaler Inhale 1-2 puffs into the lungs every 6 (six) hours as needed for wheezing or shortness of breath. 1 each 0   aspirin EC 81 MG EC tablet Take 1 tablet (81 mg total) by mouth daily.     AURYXIA 1 GM 210 MG(Fe) tablet Take 420 mg by mouth 2 (two) times a day.     B Complex-C-Folic Acid (DIALYVITE 299) 0.8 MG TABS Take 1 tablet by mouth daily.     B Complex-C-Zn-Folic Acid (DIALYVITE/ZINC) TABS Take 1 tablet by mouth daily.     cinacalcet (SENSIPAR) 30 MG  tablet Take 30 mg by mouth daily.     Doxercalciferol (HECTOROL IV) Dialysis Tuesday,Thursday and saturday     midodrine (PROAMATINE) 10 MG tablet Take 10 mg by mouth See admin instructions. Dialysis days [place] Saturday     Vitamin D, Ergocalciferol, 50 MCG (2000 UT) CAPS Take 2,000 Units by mouth daily.     ezetimibe (ZETIA) 10 MG tablet TAKE 1 TABLET BY MOUTH  DAILY  OVERDUE FOLLOW UP  APPOINTMENT NEEDED FOR  FURTHER REFILLS. (Patient not taking: Reported on 04/26/2022) 15 tablet 0   Omega-3 Fatty Acids (FISH OIL) 1000 MG CPDR Take 1,000 mg by mouth daily.  (Patient not taking: Reported on 04/26/2022)     omeprazole (PRILOSEC) 20 MG capsule Take 1 capsule (20 mg total) by mouth daily. 90 capsule 1   rosuvastatin (CRESTOR) 40 MG tablet TAKE 1 TABLET BY MOUTH  DAILY (Patient not taking: Reported on 04/26/2022) 15 tablet 0   No current facility-administered medications for this visit.    Allergies:   Other, Sulfa antibiotics, and Sulfa drugs cross reactors    Social History:  The patient  reports that she quit smoking about 38 years ago. Her smoking use included cigarettes. She has a 20.00 pack-year smoking history. She has never used smokeless tobacco. She reports that she does not drink alcohol and does not use drugs.   Family History:  The patient's family history includes Breast cancer in her sister; CAD in her father and sister; Cancer in her brother; Diabetes in her mother; Heart attack in her father; Heart disease in her father; Hypertension in her father; Peripheral vascular disease in her mother.    ROS:  Please see the history of present illness.   Otherwise, review of systems are positive for none.   All other systems are reviewed and negative.    PHYSICAL EXAM: VS:  BP (!) 90/50 (BP Location: Right Arm, Patient Position: Sitting, Cuff Size: Large)   Pulse 68   Ht 5\' 4"  (1.626 m)   Wt 229 lb (103.9 kg)   SpO2 97%   BMI 39.31 kg/m  , BMI Body mass index is 39.31 kg/m. GEN:  Well nourished, well developed, in no acute distress  HEENT: normal  Neck: no JVD, carotid bruits, or masses Cardiac: RRR; no murmurs, rubs, or gallops, mild bilateral leg edema. Respiratory:  clear to auscultation bilaterally, normal work of breathing GI: soft, nontender, nondistended, + BS MS: no deformity or atrophy  Skin: warm and dry, no rash Neuro:  Strength and sensation are intact Psych: euthymic mood, full affect Vascular: The patient could not get to the exam table to examine femoral pulses.  Distal pulses are not palpable.  EKG:  EKG is ordered today. The ekg ordered today demonstrates sinus rhythm with frequent PACs and motion artifact.  Poor R wave  progression in the anterior leads.  Possible old inferior infarct.   Recent Labs: 02/12/2022: ALT 15 02/25/2022: BNP 157.2 04/11/2022: BUN 16; Creatinine, Ser 4.85; Hemoglobin 12.2; Platelets 128; Potassium 3.2; Sodium 136    Lipid Panel    Component Value Date/Time   CHOL 142 10/10/2021 1109   TRIG 133 10/10/2021 1109   HDL 46 10/10/2021 1109   CHOLHDL 3.1 10/10/2021 1109   CHOLHDL 3.2 12/04/2016 0528   VLDL 9 12/04/2016 0528   LDLCALC 73 10/10/2021 1109      Wt Readings from Last 3 Encounters:  04/26/22 229 lb (103.9 kg)  04/11/22 239 lb 6.7 oz (108.6 kg)  03/25/22 238 lb (108 kg)           View : No data to display.            ASSESSMENT AND PLAN:  1.  Peripheral arterial disease: The patient's symptoms on the left side are suggestive of rest pain.  Fortunately, she does not have any lower extremity ulceration.  Her functional capacity is very poor overall.  I discussed with her the option of proceeding with angiography and possible endovascular intervention but she prefers a conservative approach initially.  2.  Coronary artery disease involving native coronary arteries without angina: She has no anginal symptoms.  Continue aspirin daily. The patient used to live in [place] but moved to [place]  and will follow-up with  me going forward  3.  End-stage renal disease on hemodialysis: On dialysis Tuesday, Thursday and Saturday.  She appears to be euvolemic.  Hypertension continues to be an issue with dialysis.  She is currently on midodrine.  4.  Hyperlipidemia: She ran out of rosuvastatin and ezetimibe.  Both of these were refilled today.    Disposition:   FU with me in 3 months  Signed,  Kathlyn Sacramento, MD  04/26/2022 10:56 AM    [place]

## 2022-04-26 NOTE — Patient Instructions (Signed)
Medication Instructions:  Your physician recommends that you continue on your current medications as directed. Please refer to the Current Medication list given to you today.  Zetia 10 mg daily and Crestor 40 mg daily has been refilled today.  *If you need a refill on your cardiac medications before your next appointment, please call your pharmacy*   Lab Work: None ordered If you have labs (blood work) drawn today and your tests are completely normal, you will receive your results only by: [place] (if you have MyChart) OR A paper copy in the mail If you have any lab test that is abnormal or we need to change your treatment, we will call you to review the results.   Testing/Procedures: None ordered   Follow-Up: At [facility], you and your health needs are our priority.  As part of our continuing mission to provide you with exceptional heart care, we have created designated Provider Care Teams.  These Care Teams include your primary Cardiologist (physician) and Advanced Practice Providers (APPs -  Physician Assistants and Nurse Practitioners) who all work together to provide you with the care you need, when you need it.  We recommend signing up for the patient portal called "MyChart".  Sign up information is provided on this After Visit Summary.  MyChart is used to connect with patients for Virtual Visits (Telemedicine).  Patients are able to view lab/test results, encounter notes, upcoming appointments, etc.  Non-urgent messages can be sent to your provider as well.   To learn more about what you can do with MyChart, go to [email or web address].    Your next appointment:   3 month(s)  The format for your next appointment:   In Person  Provider:   You may see Kathlyn Sacramento, MD or one of the following Advanced Practice Providers on your designated Care Team:   Murray Hodgkins, NP Christell Faith, PA-C Cadence Kathlen Mody, PA-C{   Other Instructions N/A  Important  Information About Sugar

## 2022-04-28 ENCOUNTER — Other Ambulatory Visit: Payer: Self-pay | Admitting: Cardiology

## 2022-04-30 ENCOUNTER — Other Ambulatory Visit: Payer: Self-pay | Admitting: *Deleted

## 2022-04-30 MED ORDER — EZETIMIBE 10 MG PO TABS: 10.0000 mg | ORAL_TABLET | Freq: Every day | ORAL | 5 refills | Status: AC

## 2022-05-02 DIAGNOSIS — D689 Coagulation defect, unspecified: Secondary | ICD-10-CM | POA: Diagnosis not present

## 2022-05-02 DIAGNOSIS — N2581 Secondary hyperparathyroidism of renal origin: Secondary | ICD-10-CM | POA: Diagnosis not present

## 2022-05-02 DIAGNOSIS — Z992 Dependence on renal dialysis: Secondary | ICD-10-CM | POA: Diagnosis not present

## 2022-05-02 DIAGNOSIS — E1129 Type 2 diabetes mellitus with other diabetic kidney complication: Secondary | ICD-10-CM | POA: Diagnosis not present

## 2022-05-02 DIAGNOSIS — N186 End stage renal disease: Secondary | ICD-10-CM | POA: Diagnosis not present

## 2022-05-04 DIAGNOSIS — Z992 Dependence on renal dialysis: Secondary | ICD-10-CM | POA: Diagnosis not present

## 2022-05-04 DIAGNOSIS — N186 End stage renal disease: Secondary | ICD-10-CM | POA: Diagnosis not present

## 2022-05-04 DIAGNOSIS — N2581 Secondary hyperparathyroidism of renal origin: Secondary | ICD-10-CM | POA: Diagnosis not present

## 2022-05-04 DIAGNOSIS — E1129 Type 2 diabetes mellitus with other diabetic kidney complication: Secondary | ICD-10-CM | POA: Diagnosis not present

## 2022-05-04 DIAGNOSIS — D689 Coagulation defect, unspecified: Secondary | ICD-10-CM | POA: Diagnosis not present

## 2022-05-06 DIAGNOSIS — M1712 Unilateral primary osteoarthritis, left knee: Secondary | ICD-10-CM | POA: Diagnosis not present

## 2022-05-06 DIAGNOSIS — K219 Gastro-esophageal reflux disease without esophagitis: Secondary | ICD-10-CM | POA: Diagnosis not present

## 2022-05-06 DIAGNOSIS — N2581 Secondary hyperparathyroidism of renal origin: Secondary | ICD-10-CM | POA: Diagnosis not present

## 2022-05-06 DIAGNOSIS — Z955 Presence of coronary angioplasty implant and graft: Secondary | ICD-10-CM | POA: Diagnosis not present

## 2022-05-06 DIAGNOSIS — K5790 Diverticulosis of intestine, part unspecified, without perforation or abscess without bleeding: Secondary | ICD-10-CM | POA: Diagnosis not present

## 2022-05-06 DIAGNOSIS — D689 Coagulation defect, unspecified: Secondary | ICD-10-CM | POA: Diagnosis not present

## 2022-05-06 DIAGNOSIS — M25511 Pain in right shoulder: Secondary | ICD-10-CM | POA: Diagnosis not present

## 2022-05-06 DIAGNOSIS — I2510 Atherosclerotic heart disease of native coronary artery without angina pectoris: Secondary | ICD-10-CM | POA: Diagnosis not present

## 2022-05-06 DIAGNOSIS — M79671 Pain in right foot: Secondary | ICD-10-CM | POA: Diagnosis not present

## 2022-05-06 DIAGNOSIS — E1122 Type 2 diabetes mellitus with diabetic chronic kidney disease: Secondary | ICD-10-CM | POA: Diagnosis not present

## 2022-05-06 DIAGNOSIS — D509 Iron deficiency anemia, unspecified: Secondary | ICD-10-CM | POA: Diagnosis not present

## 2022-05-06 DIAGNOSIS — E782 Mixed hyperlipidemia: Secondary | ICD-10-CM | POA: Diagnosis not present

## 2022-05-06 DIAGNOSIS — I252 Old myocardial infarction: Secondary | ICD-10-CM | POA: Diagnosis not present

## 2022-05-06 DIAGNOSIS — R1319 Other dysphagia: Secondary | ICD-10-CM | POA: Diagnosis not present

## 2022-05-06 DIAGNOSIS — I5030 Unspecified diastolic (congestive) heart failure: Secondary | ICD-10-CM | POA: Diagnosis not present

## 2022-05-06 DIAGNOSIS — E46 Unspecified protein-calorie malnutrition: Secondary | ICD-10-CM | POA: Diagnosis not present

## 2022-05-06 DIAGNOSIS — K449 Diaphragmatic hernia without obstruction or gangrene: Secondary | ICD-10-CM | POA: Diagnosis not present

## 2022-05-06 DIAGNOSIS — G4733 Obstructive sleep apnea (adult) (pediatric): Secondary | ICD-10-CM | POA: Diagnosis not present

## 2022-05-06 DIAGNOSIS — G8929 Other chronic pain: Secondary | ICD-10-CM | POA: Diagnosis not present

## 2022-05-06 DIAGNOSIS — I132 Hypertensive heart and chronic kidney disease with heart failure and with stage 5 chronic kidney disease, or end stage renal disease: Secondary | ICD-10-CM | POA: Diagnosis not present

## 2022-05-06 DIAGNOSIS — Z992 Dependence on renal dialysis: Secondary | ICD-10-CM | POA: Diagnosis not present

## 2022-05-06 DIAGNOSIS — H9203 Otalgia, bilateral: Secondary | ICD-10-CM | POA: Diagnosis not present

## 2022-05-06 DIAGNOSIS — N186 End stage renal disease: Secondary | ICD-10-CM | POA: Diagnosis not present

## 2022-05-06 DIAGNOSIS — M79672 Pain in left foot: Secondary | ICD-10-CM | POA: Diagnosis not present

## 2022-05-07 DIAGNOSIS — N186 End stage renal disease: Secondary | ICD-10-CM | POA: Diagnosis not present

## 2022-05-07 DIAGNOSIS — N2581 Secondary hyperparathyroidism of renal origin: Secondary | ICD-10-CM | POA: Diagnosis not present

## 2022-05-07 DIAGNOSIS — Z992 Dependence on renal dialysis: Secondary | ICD-10-CM | POA: Diagnosis not present

## 2022-05-07 DIAGNOSIS — E1129 Type 2 diabetes mellitus with other diabetic kidney complication: Secondary | ICD-10-CM | POA: Diagnosis not present

## 2022-05-07 DIAGNOSIS — D689 Coagulation defect, unspecified: Secondary | ICD-10-CM | POA: Diagnosis not present

## 2022-05-09 ENCOUNTER — Other Ambulatory Visit (INDEPENDENT_AMBULATORY_CARE_PROVIDER_SITE_OTHER): Payer: Medicare Other | Admitting: Nurse Practitioner

## 2022-05-09 DIAGNOSIS — G4733 Obstructive sleep apnea (adult) (pediatric): Secondary | ICD-10-CM

## 2022-05-09 DIAGNOSIS — I5032 Chronic diastolic (congestive) heart failure: Secondary | ICD-10-CM

## 2022-05-09 DIAGNOSIS — H9203 Otalgia, bilateral: Secondary | ICD-10-CM | POA: Diagnosis not present

## 2022-05-09 DIAGNOSIS — N186 End stage renal disease: Secondary | ICD-10-CM | POA: Diagnosis not present

## 2022-05-09 DIAGNOSIS — D689 Coagulation defect, unspecified: Secondary | ICD-10-CM | POA: Diagnosis not present

## 2022-05-09 DIAGNOSIS — E1129 Type 2 diabetes mellitus with other diabetic kidney complication: Secondary | ICD-10-CM | POA: Diagnosis not present

## 2022-05-09 DIAGNOSIS — I2510 Atherosclerotic heart disease of native coronary artery without angina pectoris: Secondary | ICD-10-CM | POA: Diagnosis not present

## 2022-05-09 DIAGNOSIS — M1712 Unilateral primary osteoarthritis, left knee: Secondary | ICD-10-CM | POA: Diagnosis not present

## 2022-05-09 DIAGNOSIS — E1122 Type 2 diabetes mellitus with diabetic chronic kidney disease: Secondary | ICD-10-CM

## 2022-05-09 DIAGNOSIS — E782 Mixed hyperlipidemia: Secondary | ICD-10-CM | POA: Diagnosis not present

## 2022-05-09 DIAGNOSIS — N2581 Secondary hyperparathyroidism of renal origin: Secondary | ICD-10-CM | POA: Diagnosis not present

## 2022-05-09 DIAGNOSIS — I132 Hypertensive heart and chronic kidney disease with heart failure and with stage 5 chronic kidney disease, or end stage renal disease: Secondary | ICD-10-CM | POA: Diagnosis not present

## 2022-05-09 DIAGNOSIS — Z992 Dependence on renal dialysis: Secondary | ICD-10-CM | POA: Diagnosis not present

## 2022-05-09 NOTE — Progress Notes (Signed)
Received home health orders orders from [facility]. Start of care 03/15/2022.   Certification and orders from 03/15/2022 through 05/13/2022 are reviewed, signed and faxed back to home health company (faxed 03/19/2022)  Need of intermittent skilled services at home: PT  The home health care plan has been established by me and will be reviewed and updated as needed to maximize patient recovery.  I certify that all home health services have been and will be furnished to the patient while under my care.  Face-to-face encounter in which the need for home health services was established: 02/25/2022  Patient is receiving home health services for the following diagnoses: Problem List Items Addressed This Visit       Respiratory   OSA (obstructive sleep apnea)     Other   Mixed hyperlipidemia   Otalgia of both ears   Other Visit Diagnoses     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 ([place])    -  Primary   Chronic diastolic heart failure ([place])       Type 2 diabetes mellitus with end-stage renal disease ([place])       Primary osteoarthritis of left knee       Atherosclerosis of native coronary artery of native heart without angina pectoris            [name], FNP

## 2022-05-11 DIAGNOSIS — Z992 Dependence on renal dialysis: Secondary | ICD-10-CM | POA: Diagnosis not present

## 2022-05-11 DIAGNOSIS — N186 End stage renal disease: Secondary | ICD-10-CM | POA: Diagnosis not present

## 2022-05-11 DIAGNOSIS — D689 Coagulation defect, unspecified: Secondary | ICD-10-CM | POA: Diagnosis not present

## 2022-05-11 DIAGNOSIS — N2581 Secondary hyperparathyroidism of renal origin: Secondary | ICD-10-CM | POA: Diagnosis not present

## 2022-05-11 DIAGNOSIS — E1129 Type 2 diabetes mellitus with other diabetic kidney complication: Secondary | ICD-10-CM | POA: Diagnosis not present

## 2022-05-13 DIAGNOSIS — K5790 Diverticulosis of intestine, part unspecified, without perforation or abscess without bleeding: Secondary | ICD-10-CM | POA: Diagnosis not present

## 2022-05-13 DIAGNOSIS — I252 Old myocardial infarction: Secondary | ICD-10-CM | POA: Diagnosis not present

## 2022-05-13 DIAGNOSIS — M1712 Unilateral primary osteoarthritis, left knee: Secondary | ICD-10-CM | POA: Diagnosis not present

## 2022-05-13 DIAGNOSIS — R1319 Other dysphagia: Secondary | ICD-10-CM | POA: Diagnosis not present

## 2022-05-13 DIAGNOSIS — D689 Coagulation defect, unspecified: Secondary | ICD-10-CM | POA: Diagnosis not present

## 2022-05-13 DIAGNOSIS — E1122 Type 2 diabetes mellitus with diabetic chronic kidney disease: Secondary | ICD-10-CM | POA: Diagnosis not present

## 2022-05-13 DIAGNOSIS — Z955 Presence of coronary angioplasty implant and graft: Secondary | ICD-10-CM | POA: Diagnosis not present

## 2022-05-13 DIAGNOSIS — K219 Gastro-esophageal reflux disease without esophagitis: Secondary | ICD-10-CM | POA: Diagnosis not present

## 2022-05-13 DIAGNOSIS — N186 End stage renal disease: Secondary | ICD-10-CM | POA: Diagnosis not present

## 2022-05-13 DIAGNOSIS — E46 Unspecified protein-calorie malnutrition: Secondary | ICD-10-CM | POA: Diagnosis not present

## 2022-05-13 DIAGNOSIS — G4733 Obstructive sleep apnea (adult) (pediatric): Secondary | ICD-10-CM | POA: Diagnosis not present

## 2022-05-13 DIAGNOSIS — E782 Mixed hyperlipidemia: Secondary | ICD-10-CM | POA: Diagnosis not present

## 2022-05-13 DIAGNOSIS — K449 Diaphragmatic hernia without obstruction or gangrene: Secondary | ICD-10-CM | POA: Diagnosis not present

## 2022-05-13 DIAGNOSIS — Z992 Dependence on renal dialysis: Secondary | ICD-10-CM | POA: Diagnosis not present

## 2022-05-13 DIAGNOSIS — I132 Hypertensive heart and chronic kidney disease with heart failure and with stage 5 chronic kidney disease, or end stage renal disease: Secondary | ICD-10-CM | POA: Diagnosis not present

## 2022-05-13 DIAGNOSIS — M79671 Pain in right foot: Secondary | ICD-10-CM | POA: Diagnosis not present

## 2022-05-13 DIAGNOSIS — M25511 Pain in right shoulder: Secondary | ICD-10-CM | POA: Diagnosis not present

## 2022-05-13 DIAGNOSIS — M79672 Pain in left foot: Secondary | ICD-10-CM | POA: Diagnosis not present

## 2022-05-13 DIAGNOSIS — I2510 Atherosclerotic heart disease of native coronary artery without angina pectoris: Secondary | ICD-10-CM | POA: Diagnosis not present

## 2022-05-13 DIAGNOSIS — H9203 Otalgia, bilateral: Secondary | ICD-10-CM | POA: Diagnosis not present

## 2022-05-13 DIAGNOSIS — I5030 Unspecified diastolic (congestive) heart failure: Secondary | ICD-10-CM | POA: Diagnosis not present

## 2022-05-13 DIAGNOSIS — G8929 Other chronic pain: Secondary | ICD-10-CM | POA: Diagnosis not present

## 2022-05-13 DIAGNOSIS — N2581 Secondary hyperparathyroidism of renal origin: Secondary | ICD-10-CM | POA: Diagnosis not present

## 2022-05-13 DIAGNOSIS — D509 Iron deficiency anemia, unspecified: Secondary | ICD-10-CM | POA: Diagnosis not present

## 2022-05-14 DIAGNOSIS — N186 End stage renal disease: Secondary | ICD-10-CM | POA: Diagnosis not present

## 2022-05-14 DIAGNOSIS — Z992 Dependence on renal dialysis: Secondary | ICD-10-CM | POA: Diagnosis not present

## 2022-05-14 DIAGNOSIS — D689 Coagulation defect, unspecified: Secondary | ICD-10-CM | POA: Diagnosis not present

## 2022-05-14 DIAGNOSIS — E1129 Type 2 diabetes mellitus with other diabetic kidney complication: Secondary | ICD-10-CM | POA: Diagnosis not present

## 2022-05-14 DIAGNOSIS — N2581 Secondary hyperparathyroidism of renal origin: Secondary | ICD-10-CM | POA: Diagnosis not present

## 2022-05-16 DIAGNOSIS — N186 End stage renal disease: Secondary | ICD-10-CM | POA: Diagnosis not present

## 2022-05-16 DIAGNOSIS — N2581 Secondary hyperparathyroidism of renal origin: Secondary | ICD-10-CM | POA: Diagnosis not present

## 2022-05-16 DIAGNOSIS — E1129 Type 2 diabetes mellitus with other diabetic kidney complication: Secondary | ICD-10-CM | POA: Diagnosis not present

## 2022-05-16 DIAGNOSIS — Z992 Dependence on renal dialysis: Secondary | ICD-10-CM | POA: Diagnosis not present

## 2022-05-16 DIAGNOSIS — D689 Coagulation defect, unspecified: Secondary | ICD-10-CM | POA: Diagnosis not present

## 2022-05-19 DIAGNOSIS — D689 Coagulation defect, unspecified: Secondary | ICD-10-CM | POA: Diagnosis not present

## 2022-05-19 DIAGNOSIS — E1129 Type 2 diabetes mellitus with other diabetic kidney complication: Secondary | ICD-10-CM | POA: Diagnosis not present

## 2022-05-19 DIAGNOSIS — Z992 Dependence on renal dialysis: Secondary | ICD-10-CM | POA: Diagnosis not present

## 2022-05-19 DIAGNOSIS — N186 End stage renal disease: Secondary | ICD-10-CM | POA: Diagnosis not present

## 2022-05-19 DIAGNOSIS — N2581 Secondary hyperparathyroidism of renal origin: Secondary | ICD-10-CM | POA: Diagnosis not present

## 2022-05-20 ENCOUNTER — Telehealth: Payer: Medicare Other

## 2022-05-20 ENCOUNTER — Other Ambulatory Visit: Payer: Self-pay | Admitting: Nurse Practitioner

## 2022-05-20 ENCOUNTER — Ambulatory Visit: Payer: Self-pay

## 2022-05-20 DIAGNOSIS — I132 Hypertensive heart and chronic kidney disease with heart failure and with stage 5 chronic kidney disease, or end stage renal disease: Secondary | ICD-10-CM

## 2022-05-20 DIAGNOSIS — E1122 Type 2 diabetes mellitus with diabetic chronic kidney disease: Secondary | ICD-10-CM

## 2022-05-20 NOTE — Chronic Care Management (AMB) (Signed)
Care Management    RN Visit Note  05/20/2022 Name: Jacqueline [name] MRN: [id number] DOB: 07/13/1946  Subjective: Jacqueline [name] is a 76 y.o. year old female who is a primary care patient of [name], [place]. The care management team was consulted for assistance with disease management and care coordination needs.    Engaged with patient by telephone for follow up visit in response to provider referral for case management and/or care coordination services.   Consent to Services:   Jacqueline [name] was given information about Care Management services today including:  Care Management services includes personalized support from designated clinical staff supervised by her physician, including individualized plan of care and coordination with other care providers 24/7 contact phone numbers for assistance for urgent and routine care needs. The patient may stop case management services at any time by phone call to the office staff.  Patient agreed to services and consent obtained.   Assessment: Review of patient past medical history, allergies, medications, health status, including review of consultants reports, laboratory and other test data, was performed as part of comprehensive evaluation and provision of chronic care management services.   SDOH (Social Determinants of Health) assessments and interventions performed:  Yes, see care plan   Care Plan  Allergies  Allergen Reactions   Other Other (See Comments)   Sulfa Antibiotics Other (See Comments)   Sulfa Drugs Cross Reactors Other (See Comments)    Doesn't remember     Outpatient Encounter Medications as of 05/20/2022  Medication Sig   acetaminophen (TYLENOL) 500 MG tablet Take 1,000 mg by mouth every 6 (six) hours as needed for moderate pain or headache.   albuterol (VENTOLIN HFA) 108 (90 Base) MCG/ACT inhaler Inhale 1-2 puffs into the lungs every 6 (six) hours as needed for wheezing or shortness of breath.   aspirin EC 81 MG  EC tablet Take 1 tablet (81 mg total) by mouth daily.   AURYXIA 1 GM 210 MG(Fe) tablet Take 420 mg by mouth 2 (two) times a day.   B Complex-C-Folic Acid (DIALYVITE 283) 0.8 MG TABS Take 1 tablet by mouth daily.   B Complex-C-Zn-Folic Acid (DIALYVITE/ZINC) TABS Take 1 tablet by mouth daily.   cinacalcet (SENSIPAR) 30 MG tablet Take 30 mg by mouth daily.   Doxercalciferol (HECTOROL IV) Dialysis Tuesday,Thursday and saturday   ezetimibe (ZETIA) 10 MG tablet Take 1 tablet (10 mg total) by mouth daily.   midodrine (PROAMATINE) 10 MG tablet Take 10 mg by mouth See admin instructions. Dialysis days [facility] Saturday   Omega-3 Fatty Acids (FISH OIL) 1000 MG CPDR Take 1,000 mg by mouth daily.  (Patient not taking: Reported on 04/26/2022)   omeprazole (PRILOSEC) 20 MG capsule Take 1 capsule (20 mg total) by mouth daily.   rosuvastatin (CRESTOR) 40 MG tablet TAKE 1 TABLET BY MOUTH DAILY   Vitamin D, Ergocalciferol, 50 MCG (2000 UT) CAPS Take 2,000 Units by mouth daily.   No facility-administered encounter medications on file as of 05/20/2022.    Patient Active Problem List   Diagnosis Date Noted   Anaphylactic shock, unspecified, initial encounter 06/29/2020   Sepsis ([place]) 06/22/2019   Pain due to onychomycosis of toenails of both feet 06/11/2019   Callus 06/11/2019   Other disorders of phosphorus metabolism 05/20/2019   Mixed hyperlipidemia 03/22/2019   Otalgia of both ears 03/22/2019   Right arm pain 03/22/2019   Bradycardia 10/01/2017   Dyslipidemia 10/01/2017   Dependence on renal dialysis ([place]) 09/01/2017  Chest pain, unspecified 12/06/2016   Non-ST elevation (NSTEMI) myocardial infarction ([place]) 12/06/2016   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   History of NSTEMI    CAD S/P percutaneous coronary angioplasty 12/03/2016   Hypokalemia 05/03/2016   Pruritus, unspecified 03/27/2015   Encounter for removal of sutures 11/30/2014   Pain in lower limb 08/26/2014   Encounter for immunization  06/10/2014   Unspecified protein-calorie malnutrition ([place]) 02/28/2014   Allergy, unspecified, initial encounter 02/15/2014   HCAP (healthcare-associated pneumonia) 02/13/2014   Absence of both cervix and uterus, acquired 02/01/2014   Coagulation defect, unspecified ([place]) 02/01/2014   Dyspnea, unspecified 02/01/2014   Family history of diabetes mellitus 02/01/2014   Family history of ischemic heart disease and other diseases of the circulatory system 02/01/2014   Fever, unspecified 02/01/2014   Headache, unspecified 02/01/2014   History of allergy to sulfonamides 02/01/2014   Iron deficiency anemia, unspecified 02/01/2014   Long term (current) use of insulin ([place]) 02/01/2014   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   OSA (obstructive sleep apnea) 01/21/2014   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[place]) 01/21/2014   Acute encephalopathy 01/19/2014   Congestive heart failure ([place]) 01/15/2014   ESRD (end stage renal disease) on dialysis ([place]) 01/15/2014   Hypertension 01/15/2014   Diabetes mellitus ([place]) 01/15/2014   Anemia 01/15/2014   Onychomycosis 03/26/2013   Pain in joint, ankle and foot 03/26/2013    Conditions to be addressed/monitored: DMII, with ESRD, 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   Care Plan : RN Care Manager Plan of Care  Updates made by Lynne Logan, RN since 05/20/2022 12:00 AM     Problem: No plan of care established for management of chronic disease states Type 2 diabetes mellitus with end-stage renal disease, 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   Priority: High     Long-Range Goal: Establishment of plan of care for management of chronic disease states Type 2 diabetes mellitus with end-stage renal disease, 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   Start Date: 04/03/2022  Expected End Date: 04/04/2023  Recent Progress: On track  Priority: High  Note:   Current  Barriers:  Knowledge Deficits related to plan of care for management of Type 2 diabetes mellitus with end-stage renal disease, 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   Care Coordination needs related to Level of care concerns, Inability to perform ADL's independently, and Inability to perform IADL's independently Chronic Disease Management support and education needs related to Type 2 diabetes mellitus with end-stage renal disease, 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    RNCM Clinical Goal(s):  Patient will continue to work with RN Care Manager to address care management and care coordination needs related to  Type 2 diabetes mellitus with end-stage renal disease, 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   Interventions: 1:1 collaboration with primary care provider regarding development and update of comprehensive plan of care as evidenced by provider attestation and co-signature  Inter-disciplinary care team collaboration (see longitudinal plan of care) Evaluation of current treatment plan related to  self management and patient's adherence to plan as established by provider  Interdisciplinary Collaboration Interventions:  (Status: Goal on track:  Yes.) Short Term Goal   Collaborated with BSW to initiate plan of care to address needs related to Level of care concerns, Inability to perform ADL's  independently, and Inability to perform IADL's independently in patient with  Type 2 diabetes mellitus with end-stage renal disease, 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  Collaboration with [name], FNP regarding development and update of comprehensive plan of care as evidenced by provider attestation and co-signature Inter-disciplinary care team collaboration    End Stage Renal Disease Interventions:  (Status:  Goal on track:  Yes.) Long Term Goal Assessed the Patient understanding of end stage renal disease    Evaluation of current treatment plan related to chronic kidney disease self management and patient's adherence to plan as established by provider      Determined patient has been receiving hemodialysis times 10 years at the [facility] dialysis centr in [place] on Tuesday, Thursday and Saturday, her family is providing transportation Reviewed prescribed renal diet/fluid restrictions as directed, patient reports adhering to her prescribed fluid restriction, she usually has 1-2 lbs of fluid removed with each treatment  Discussed BP remains to be low during her treatments despite taking Midodrine as prescribed Determined patient has BP cuff at home and is able to monitor her BP; Educated on normal parameters and when to call the doctor if needed  Discussed patient feels her baseline BP is around 100/48-50's Educated patient on cautionary measures to take with position change to allow BP to adjust and help avoid syncope episodes Educated patient on interventions to help raise BP such as sipping on chicken broth  Discussed the impact of chronic kidney disease on daily life and mental health and acknowledged and normalized feelings of disempowerment, fear, and frustration    Last practice recorded BP readings:  BP Readings from Last 3 Encounters:  04/26/22 (!) 90/50  04/11/22 (!) 103/47  03/25/22 110/64  Most recent eGFR/CrCl:  Lab Results  Component Value  Date   EGFR 5 (L) 10/10/2021    No components found for: CRCL   Diabetes Interventions:  (Status:  Goal on track:  Yes.) Long Term Goal Assessed patient's understanding of A1c goal: <6.5% Provided education to patient about basic DM disease process; Educated patient while providing rationale the importance to keep A1c under 6.5% in order to help prevent diabetes complications  Counseled on importance of regular laboratory monitoring as prescribed Review of patient status, including review of consultants reports, relevant laboratory and other test results, and medications completed Lab Results  Component Value Date   HGBA1C 6.9 (H) 10/10/2021   Grief Interventions:  (Status:  Goal on track:  Yes.)  Long Term Goal Evaluation of current treatment plan related to Depression/Grief self-management and patient's adherence to plan as established by provider Determined patient lost her husband on 04/02/22 suddenly after he experienced a fall and stayed a short time in the hospital and rehab Determined patient feels her grieving is stable, she states "I am coming along"  Determined patient continues to live with her son and daughter in law, she has a strong support system at home and declines needing grief counseling at this time  Provided active listening to patient and validated her feelings of despair for the loss of her spouse Encouraged patient to  notify her PCP if her feeling of depression and grief worsen Discussed plans with patient for ongoing care management follow up and provided patient with direct contact information for care management team   PAD (Peripheral Arterial Disease) Interventions:  (Status:  New goal.)  Long Term Goal Evaluation of current treatment plan related to  PAD , self-management and patient's adherence to plan as established by provider Reviewed and discussed the following Cardiology follow up completed with Dr. Rogue Jury on 04/26/22 with the following Assessment/Plan  noted:  ASSESSMENT AND PLAN: 1.  Peripheral arterial disease: The patient's symptoms on the left side are suggestive of rest pain.  Fortunately, she does not have any lower extremity ulceration.  Her functional capacity is very poor overall.  I discussed with her the option of proceeding with angiography and possible endovascular intervention but she prefers a conservative approach initially. 2.  Coronary artery disease involving native coronary arteries without angina: She has no anginal symptoms.  Continue aspirin daily. The patient used to live in [place] but moved to [place] and will follow-up with  me going forward 3.  End-stage renal disease on hemodialysis: On dialysis Tuesday, Thursday and Saturday.  She appears to be euvolemic.  Hypertension continues to be an issue with dialysis.  She is currently on midodrine. 4.  Hyperlipidemia: She ran out of rosuvastatin and ezetimibe.  Both of these were refilled today. Disposition:   FU with me in 3 months Determined patient verbalizes understanding of her treatment plan Educated patient with rationale the importance of completing daily inspections of feet/legs to help identify ulcerations, educated on appearance of early changes in skin and when to notify MD  Discussed plans with patient for ongoing care management follow up and provided patient with direct contact information for care management team   Wound Interventions:  (Status:  New goal.)  Long Term Goal Evaluation of current treatment plan related to  Pressure Ulcer to sacrum , self-management and patient's adherence to plan as established by provider Determined patient has an open wound to her sacrum secondary to pressure from prolonged sitting due to immobility Discussed her daughter in law is performing wound care, this wound has not been assessed by a physician Discussed needing PCP evaluation of wound in order to treat, patient declines at this time due to stating she is in  [place] and her family cannot get to the [place] office Discussed patient's son plans to take her to the [facility] ED for evaluation of this wound  Determined patient is receiving in home PT via [facility], a SNV has not been ordered Educated patient on importance of shifting weight, avoiding friction when changing positions and using pillows for support to help relieve pressure from skin Educated on importance of keeping wound clean and dry Collaborated with PCP [name] FNP providing patient wound status update, requested a SNV be ordered via [facility]  Further collaboration with PCP and embedded BSW Daneen Schick concerning patient's inability to transport to MD appointments and lack of caregiver, determined she may benefit from transitioning primary care to [place] additional outbound call to patient and spoke with son [name] regarding level of care concerns Determined son Lester Kinsman is working with [place] regarding Medicaid and placement, unfortunately he has learned his mother's income exceeds the eligible amount for Medicaid  Discussed son [name] learning patient will not qualify for the PACE program due to she is unable to get herself to the door  for pick up via P[facility] for adult daycare services Educated son regarding Remote Health and PCP recommendations for patient to transition to this service in order to get the medical attention needed Discussed with son [name], following up with the embedded SW Daneen Schick to explore other caregiver options, [name] would like to contact Tillie Rung when ready and is agreeable to having PCP place the Remote Health referral   Sent in basket message to [name] FNP and [place] regarding referral for Remote Health, advised Tillie Rung son [name] was provided her contact information and will call her when ready to discuss other caregiver options, determined PCP will send the Remote Health  referral  Discussed plans with patient for ongoing care management follow up and provided patient/son with direct contact information for care management team pending transition to Remote Health   Patient Goals/Self-Care Activities: Take all medications as prescribed Attend all scheduled provider appointments Call pharmacy for medication refills 3-7 days in advance of running out of medications Perform all self care activities independently  Call provider office for new concerns or questions  check feet daily for cuts, sores or redness manage portion size wash and dry feet carefully every day check blood pressure 3 times per week report new symptoms to your doctor take all medications exactly as prescribed  Follow Up Plan:  Initial RN Care manager telephone follow up call scheduled with patient/caregiver for: 06/05/22     Barb Merino, RN, BSN, CCM Care Management Coordinator [place] [facility]  Direct Phone: [phone]

## 2022-05-20 NOTE — Patient Instructions (Signed)
Visit Information  Thank you for taking time to visit with me today. Please don't hesitate to contact me if I can be of assistance to you before our next scheduled telephone appointment.  Following are the goals we discussed today:  (Copy and paste patient goals from clinical care plan here)  Our next appointment is by telephone on 06/05/22 at 1:30 PM   Please call the care guide team at [phone] if you need to cancel or reschedule your appointment.   If you are experiencing a Mental Health or [place] or need someone to talk to, please call 1-800-273-TALK (toll free, 24 hour hotline)   Patient verbalizes understanding of instructions and care plan provided today and agrees to view in [place]. Active MyChart status and patient understanding of how to access instructions and care plan via MyChart confirmed with patient.     Barb Merino, RN, BSN, CCM Care Management Coordinator [place] [facility]  Direct Phone: [phone]

## 2022-05-21 ENCOUNTER — Emergency Department: Payer: Medicare Other

## 2022-05-21 ENCOUNTER — Other Ambulatory Visit: Payer: Self-pay

## 2022-05-21 ENCOUNTER — Observation Stay
Admission: EM | Admit: 2022-05-21 | Discharge: 2022-05-30 | Disposition: A | Discharge status: Skilled Nursing Facility | Payer: Medicare Other | Attending: Internal Medicine | Admitting: Internal Medicine

## 2022-05-21 DIAGNOSIS — N2581 Secondary hyperparathyroidism of renal origin: Secondary | ICD-10-CM | POA: Diagnosis not present

## 2022-05-21 DIAGNOSIS — L8990 Pressure ulcer of unspecified site, unspecified stage: Secondary | ICD-10-CM | POA: Insufficient documentation

## 2022-05-21 DIAGNOSIS — R531 Weakness: Principal | ICD-10-CM

## 2022-05-21 DIAGNOSIS — D631 Anemia in chronic kidney disease: Secondary | ICD-10-CM | POA: Diagnosis not present

## 2022-05-21 DIAGNOSIS — D72829 Elevated white blood cell count, unspecified: Secondary | ICD-10-CM | POA: Diagnosis not present

## 2022-05-21 DIAGNOSIS — K219 Gastro-esophageal reflux disease without esophagitis: Secondary | ICD-10-CM | POA: Diagnosis not present

## 2022-05-21 DIAGNOSIS — K5900 Constipation, unspecified: Secondary | ICD-10-CM | POA: Insufficient documentation

## 2022-05-21 DIAGNOSIS — S31000A Unspecified open wound of lower back and pelvis without penetration into retroperitoneum, initial encounter: Secondary | ICD-10-CM

## 2022-05-21 DIAGNOSIS — I509 Heart failure, unspecified: Secondary | ICD-10-CM | POA: Insufficient documentation

## 2022-05-21 DIAGNOSIS — I739 Peripheral vascular disease, unspecified: Secondary | ICD-10-CM

## 2022-05-21 DIAGNOSIS — N186 End stage renal disease: Secondary | ICD-10-CM

## 2022-05-21 DIAGNOSIS — Z6841 Body Mass Index (BMI) 40.0 and over, adult: Secondary | ICD-10-CM | POA: Insufficient documentation

## 2022-05-21 DIAGNOSIS — L89153 Pressure ulcer of sacral region, stage 3: Secondary | ICD-10-CM | POA: Insufficient documentation

## 2022-05-21 DIAGNOSIS — S34139A Unspecified injury to sacral spinal cord, initial encounter: Secondary | ICD-10-CM | POA: Diagnosis not present

## 2022-05-21 DIAGNOSIS — Z992 Dependence on renal dialysis: Secondary | ICD-10-CM | POA: Diagnosis not present

## 2022-05-21 DIAGNOSIS — I132 Hypertensive heart and chronic kidney disease with heart failure and with stage 5 chronic kidney disease, or end stage renal disease: Secondary | ICD-10-CM | POA: Insufficient documentation

## 2022-05-21 DIAGNOSIS — D689 Coagulation defect, unspecified: Secondary | ICD-10-CM | POA: Diagnosis not present

## 2022-05-21 DIAGNOSIS — N261 Atrophy of kidney (terminal): Secondary | ICD-10-CM | POA: Diagnosis not present

## 2022-05-21 DIAGNOSIS — I959 Hypotension, unspecified: Secondary | ICD-10-CM | POA: Insufficient documentation

## 2022-05-21 DIAGNOSIS — E1122 Type 2 diabetes mellitus with diabetic chronic kidney disease: Secondary | ICD-10-CM | POA: Diagnosis not present

## 2022-05-21 DIAGNOSIS — E1129 Type 2 diabetes mellitus with other diabetic kidney complication: Secondary | ICD-10-CM | POA: Diagnosis not present

## 2022-05-21 DIAGNOSIS — N281 Cyst of kidney, acquired: Secondary | ICD-10-CM | POA: Diagnosis not present

## 2022-05-21 DIAGNOSIS — R778 Other specified abnormalities of plasma proteins: Secondary | ICD-10-CM

## 2022-05-21 LAB — CBC WITH DIFFERENTIAL/PLATELET
Abs Immature Granulocytes: 0.05 10*3/uL (ref 0.00–0.07)
Basophils Absolute: 0 10*3/uL (ref 0.0–0.1)
Basophils Relative: 0 %
Eosinophils Absolute: 0.1 10*3/uL (ref 0.0–0.5)
Eosinophils Relative: 1 %
HCT: 41 % (ref 36.0–46.0)
Hemoglobin: 12.6 g/dL (ref 12.0–15.0)
Immature Granulocytes: 0 %
Lymphocytes Relative: 11 %
Lymphs Abs: 1.4 10*3/uL (ref 0.7–4.0)
MCH: 29.7 pg (ref 26.0–34.0)
MCHC: 30.7 g/dL (ref 30.0–36.0)
MCV: 96.7 fL (ref 80.0–100.0)
Monocytes Absolute: 1.2 10*3/uL — ABNORMAL HIGH (ref 0.1–1.0)
Monocytes Relative: 9 %
Neutro Abs: 9.6 10*3/uL — ABNORMAL HIGH (ref 1.7–7.7)
Neutrophils Relative %: 79 %
Platelets: 205 10*3/uL (ref 150–400)
RBC: 4.24 MIL/uL (ref 3.87–5.11)
RDW: 14.7 % (ref 11.5–15.5)
WBC: 12.3 10*3/uL — ABNORMAL HIGH (ref 4.0–10.5)
nRBC: 0 % (ref 0.0–0.2)

## 2022-05-21 LAB — TROPONIN I (HIGH SENSITIVITY)
Troponin I (High Sensitivity): 67 ng/L — ABNORMAL HIGH (ref <18)
Troponin I (High Sensitivity): 67 ng/L — ABNORMAL HIGH (ref <18)

## 2022-05-21 LAB — PROCALCITONIN: Procalcitonin: 0.59 ng/mL

## 2022-05-21 LAB — COMPREHENSIVE METABOLIC PANEL
ALT: 20 U/L (ref 0–44)
AST: 36 U/L (ref 15–41)
Albumin: 3.5 g/dL (ref 3.5–5.0)
Alkaline Phosphatase: 84 U/L (ref 38–126)
Anion gap: 14 (ref 5–15)
BUN: 16 mg/dL (ref 8–23)
CO2: 28 mmol/L (ref 22–32)
Calcium: 9.4 mg/dL (ref 8.9–10.3)
Chloride: 96 mmol/L — ABNORMAL LOW (ref 98–111)
Creatinine, Ser: 3.98 mg/dL — ABNORMAL HIGH (ref 0.44–1.00)
GFR, Estimated: 11 mL/min — ABNORMAL LOW (ref >60)
Glucose, Bld: 122 mg/dL — ABNORMAL HIGH (ref 70–99)
Potassium: 4.2 mmol/L (ref 3.5–5.1)
Sodium: 138 mmol/L (ref 135–145)
Total Bilirubin: 1 mg/dL (ref 0.3–1.2)
Total Protein: 8.1 g/dL (ref 6.5–8.1)

## 2022-05-21 LAB — BRAIN NATRIURETIC PEPTIDE: B Natriuretic Peptide: 190.6 pg/mL — ABNORMAL HIGH (ref 0.0–100.0)

## 2022-05-21 MED ORDER — IOHEXOL 350 MG/ML SOLN
100.0000 mL | Freq: Once | INTRAVENOUS | Status: AC | PRN
  Administered 2022-05-21: 100 mL via INTRAVENOUS

## 2022-05-21 MED ORDER — ACETAMINOPHEN 500 MG PO TABS
1000.0000 mg | ORAL_TABLET | Freq: Once | ORAL | Status: AC
  Administered 2022-05-21: 1000 mg via ORAL
  Filled 2022-05-21: qty 2

## 2022-05-21 NOTE — H&P (Signed)
History and Physical    Jacqueline [name] VOH:[id number] DOB: Aug 10, 1946 DOA: 05/21/2022  PCP: [name], FNP  Patient coming from: home  I have personally briefly reviewed patient's old medical records in [place]  Chief Complaint: progressive debility /placement  HPI: Jacqueline [name] is a 76 y.o. female with medical history significant of  HTN, DMII,ESRD on HD TTS, Chronic debility , CAD s/p stent, Left knee arthritis, Chronic lower back pain,GERD,Obesity , HLD, who presents to ED brought in by family due to progression of chronic debility and family inability to take care of patient in addition to wound check.  Patient states she had had difficulty getting around for years however however the last 9 months she has had continued decline and now unable to get up from a seated position without assistance. She states she recently started physical therapy over the last few week but has noted no improvement.  She notes she has no home health aide and her son and daughter in law assist her with all her ADLS. She states she is only able to feed her self. She also note that her husband recently died in 04-29-23 and he was her primary care giver.  Currently her daughter and son in law work and are unable to be there with her and so she sits in a chair until they return from work. Due to these she has developed pressure ulcer in the sacral area.  ON ros she notes no sob, chest pain , cough /n/v/d/dysuria she does not constipation.     ED Course:  Afeb, bp 110/70, hr 82 , rr 19 sat 98% 2L Abg: ph7.43, po2 66, sat 95.2 poco2 42  Tsh 2.44 Ce 67,67 Bnp 190 NA 138, K 4.2 cr 3.98 Wbc 12.3, hgb 12.6 plt205, pmn 9.6 IRS:WNIOE arrythmia  no hyperacute st-twave changes  CTPA/CTAB CTA of the chest: No evidence of pulmonary embolism.   Dependent atelectatic changes bilaterally.   CT of the abdomen and pelvis: Fatty infiltration of the liver.   Mild renal atrophy with stable appearing simple cysts.  No follow-up is recommended.   No other focal abnormality is seen.   Aortic Atherosclerosis (ICD10-I70.0).    Review of Systems: As per HPI otherwise 10 point review of systems negative.   Past Medical History:  Diagnosis Date   Arthritis    "left knee" (12/04/2016)   CAD (coronary artery disease)    a. 12/2016 NSTEMI/PCI: LM nl, LaD 20p/d, D1 20, OM2 20, OM3 20, RCA 5m (3.0x18 Resolute DES). EF 65%.   Chronic lower back pain    Diverticulosis    on CT   ESRD on dialysis [facility])    "[facility] [place]; TTS" (12/04/2016)   GERD (gastroesophageal reflux disease)    Hiatal hernia    Hyperlipidemia    Hypertension    a. 12/2016 labetalol d/c'd 2/2 hypotension in HD.   Morbid obesity ([place])    Obesity    Pneumonia    "couple times" (12/04/2016)   Renal insufficiency    Type II diabetes mellitus ([place])     Past Surgical History:  Procedure Laterality Date   ABDOMINAL HYSTERECTOMY     AV FISTULA PLACEMENT Left 01/27/2014   Procedure: ARTERIOVENOUS (AV) FISTULA CREATION;  Surgeon: Mal Misty, MD;  Location: [place];  Service: Vascular;  Laterality: Left;   AV FISTULA REPAIR     "had it cleaned out"   CARDIAC CATHETERIZATION  ?[place] N/A 12/04/2016  Procedure: Left Heart Cath Angiography;  Surgeon: Burnell Blanks, MD;  Location: [place] [facility];  Service: Cardiovascular;  Laterality: N/A;   CARDIAC CATHETERIZATION N/A 12/04/2016   Procedure: Coronary Stent Intervention;  Surgeon: Burnell Blanks, MD;  Location: [place] [facility];  Service: Cardiovascular;  Laterality: N/A;  Mid RCA   CATARACT EXTRACTION W/ INTRAOCULAR LENS IMPLANT Left    COLONOSCOPY W/ BIOPSIES AND POLYPECTOMY     CORONARY ANGIOPLASTY WITH STENT PLACEMENT  12/04/2016   ESOPHAGOGASTRODUODENOSCOPY     INSERTION OF DIALYSIS CATHETER N/A 01/19/2014   Procedure: INSERTION OF DIALYSIS CATHETER;  Surgeon: Rosetta Posner, MD;  Location: [place];  Service: Vascular;   Laterality: N/A;     reports that she quit smoking about 38 years ago. Her smoking use included cigarettes. She has a 20.00 pack-year smoking history. She has never used smokeless tobacco. She reports that she does not drink alcohol and does not use drugs.  Allergies  Allergen Reactions   Other Other (See Comments)   Sulfa Antibiotics Other (See Comments)   Sulfa Drugs Cross Reactors Other (See Comments)    Doesn't remember     Family History  Problem Relation Age of Onset   Diabetes Mother    Peripheral vascular disease Mother        amputation   CAD Father    Heart disease Father    Hypertension Father    Heart attack Father    CAD Sister    Cancer Brother        x 2 brothers, type unknown   Breast cancer Sister     Prior to Admission medications   Medication Sig Start Date End Date Taking? Authorizing Provider  acetaminophen (TYLENOL) 500 MG tablet Take 1,000 mg by mouth every 6 (six) hours as needed for moderate pain or headache.   Yes [provider]  albuterol (VENTOLIN HFA) 108 (90 Base) MCG/ACT inhaler Inhale 1-2 puffs into the lungs every 6 (six) hours as needed for wheezing or shortness of breath. 03/10/80  Yes Campbell Stall P, DO  aspirin EC 81 MG EC tablet Take 1 tablet (81 mg total) by mouth daily. 12/06/16  Yes Vann, Jessica U, DO  AURYXIA 1 GM 210 MG(Fe) tablet Take 420 mg by mouth 2 (two) times a day. 06/08/19  Yes [provider]  B Complex-C-Zn-Folic Acid (DIALYVITE/ZINC) TABS Take 1 tablet by mouth daily. 02/09/22  Yes [provider]  cinacalcet (SENSIPAR) 30 MG tablet Take 30 mg by mouth daily. 08/23/21  Yes [provider]  Doxercalciferol (HECTOROL IV) Dialysis Tuesday,Thursday and saturday 08/14/21 08/13/22 Yes [provider]  ezetimibe (ZETIA) 10 MG tablet Take 1 tablet (10 mg total) by mouth daily. 04/30/22  Yes Wellington Hampshire, MD  midodrine (PROAMATINE) 10 MG tablet Take 10 mg by mouth See admin instructions.  Dialysis days [facility] Saturday   Yes [provider]  omeprazole (PRILOSEC) 20 MG capsule Take 1 capsule (20 mg total) by mouth daily. 02/21/22  Yes [name], FNP  rosuvastatin (CRESTOR) 40 MG tablet TAKE 1 TABLET BY MOUTH DAILY 04/30/22  Yes Wellington Hampshire, MD  Vitamin D, Ergocalciferol, 50 MCG (2000 UT) CAPS Take 2,000 Units by mouth daily.   Yes [provider]  Omega-3 Fatty Acids (FISH OIL) 1000 MG CPDR Take 1,000 mg by mouth daily.  Patient not taking: Reported on 04/26/2022    [provider]    Physical Exam: Vitals:   05/21/22 2035  05/21/22 2100 05/21/22 2135 05/21/22 2249  BP: (!) 144/70 122/66 95/74 110/70  Pulse: 86  94 83  Resp: 17  19 19   Temp:      TempSrc:      SpO2: 97%  96% 98%  Weight:      Height:         Vitals:   05/21/22 2035 05/21/22 2100 05/21/22 2135 05/21/22 2249  BP: (!) 144/70 122/66 95/74 110/70  Pulse: 86  94 83  Resp: 17  19 19   Temp:      TempSrc:      SpO2: 97%  96% 98%  Weight:      Height:      Constitutional: NAD, calm, comfortable Eyes: PERRL, lids and conjunctivae normal ENMT: Mucous membranes are moist. Posterior pharynx clear of any exudate or lesions.Normal dentition.  Neck: normal, supple, no masses, no thyromegaly Respiratory: clear to auscultation bilaterally, no wheezing, no crackles. Normal respiratory effort. No accessory muscle use.  Cardiovascular: Regular rate and rhythm, no murmurs / rubs / gallops. +extremity edema.  Abdomen: no tenderness, no masses palpated. No hepatosplenomegaly. Bowel sounds positive.  Musculoskeletal: no clubbing / cyanosis. No joint deformity upper and lower extremities.    Skin: + sacral wound,  healing desquamative rash on lower extremity  Neurologic: CN 2-12 grossly intact. Sensation intact, Strength 4/5 in all 4.  Psychiatric: Normal judgment and insight. Alert and oriented x 3. Normal mood.    Labs on Admission: I have personally reviewed following labs and  imaging studies  CBC: Recent Labs  Lab 05/21/22 1931  WBC 12.3*  NEUTROABS 9.6*  HGB 12.6  HCT 41.0  MCV 96.7  PLT 947   Basic Metabolic Panel: Recent Labs  Lab 05/21/22 1931  NA 138  K 4.2  CL 96*  CO2 28  GLUCOSE 122*  BUN 16  CREATININE 3.98*  CALCIUM 9.4   GFR: Estimated Creatinine Clearance: 13.6 mL/min (A) (by C-G formula based on SCr of 3.98 mg/dL (H)). Liver Function Tests: Recent Labs  Lab 05/21/22 1931  AST 36  ALT 20  ALKPHOS 84  BILITOT 1.0  PROT 8.1  ALBUMIN 3.5   No results for input(s): "LIPASE", "AMYLASE" in the last 168 hours. No results for input(s): "AMMONIA" in the last 168 hours. Coagulation Profile: No results for input(s): "INR", "PROTIME" in the last 168 hours. Cardiac Enzymes: No results for input(s): "CKTOTAL", "CKMB", "CKMBINDEX", "TROPONINI" in the last 168 hours. BNP (last 3 results) No results for input(s): "PROBNP" in the last 8760 hours. HbA1C: No results for input(s): "HGBA1C" in the last 72 hours. CBG: No results for input(s): "GLUCAP" in the last 168 hours. Lipid Profile: No results for input(s): "CHOL", "HDL", "LDLCALC", "TRIG", "CHOLHDL", "LDLDIRECT" in the last 72 hours. Thyroid Function Tests: No results for input(s): "TSH", "T4TOTAL", "FREET4", "T3FREE", "THYROIDAB" in the last 72 hours. Anemia Panel: No results for input(s): "VITAMINB12", "FOLATE", "FERRITIN", "TIBC", "IRON", "RETICCTPCT" in the last 72 hours. Urine analysis:    Component Value Date/Time   COLORURINE YELLOW 01/25/2014 1903   APPEARANCEUR TURBID (A) 01/25/2014 1903   LABSPEC 1.020 01/25/2014 1903   PHURINE 5.0 01/25/2014 1903   GLUCOSEU NEGATIVE 01/25/2014 1903   HGBUR LARGE (A) 01/25/2014 1903   BILIRUBINUR SMALL (A) 01/25/2014 1903   KETONESUR NEGATIVE 01/25/2014 1903   PROTEINUR 100 (A) 01/25/2014 1903   UROBILINOGEN 1.0 01/25/2014 1903   NITRITE NEGATIVE 01/25/2014 1903   LEUKOCYTESUR LARGE (A) 01/25/2014 1903    Radiological Exams  on  Admission: CT Angio Chest PE W and/or Wo Contrast  Result Date: 05/21/2022 CLINICAL DATA:  Weakness EXAM: CT ANGIOGRAPHY CHEST CT ABDOMEN AND PELVIS WITH CONTRAST TECHNIQUE: Multidetector CT imaging of the chest was performed using the standard protocol during bolus administration of intravenous contrast. Multiplanar CT image reconstructions and MIPs were obtained to evaluate the vascular anatomy. Multidetector CT imaging of the abdomen and pelvis was performed using the standard protocol during bolus administration of intravenous contrast. RADIATION DOSE REDUCTION: This exam was performed according to the departmental dose-optimization program which includes automated exposure control, adjustment of the mA and/or kV according to patient size and/or use of iterative reconstruction technique. CONTRAST:  157mL OMNIPAQUE IOHEXOL 350 MG/ML SOLN COMPARISON:  02/12/2022, FINDINGS: CTA CHEST FINDINGS Cardiovascular: Atherosclerotic calcifications of the thoracic aorta are noted. No aneurysmal dilatation or definitive dissection is seen. Coronary calcifications are noted. Mild cardiomegaly is seen. The pulmonary artery shows a normal branching pattern. No filling defect to suggest pulmonary embolism is noted. Mediastinum/Nodes: Thoracic inlet is within normal limits. No hilar or mediastinal adenopathy is noted. The esophagus as visualized is within normal limits. Lungs/Pleura: Mild dependent atelectatic changes are noted in the lungs bilaterally. No focal infiltrate or sizable effusion is seen. No parenchymal nodules are noted. Musculoskeletal: Degenerative changes of the thoracic spine are seen. No acute rib abnormality is noted. No compression deformity is seen. Review of the MIP images confirms the above findings. CT ABDOMEN and PELVIS FINDINGS Hepatobiliary: Mild fatty infiltration of the liver is noted. Gallbladder is within normal limits. Pancreas: Unremarkable. No pancreatic ductal dilatation or surrounding  inflammatory changes. Spleen: Normal in size without focal abnormality. Adrenals/Urinary Tract: Adrenal glands are unremarkable. Kidneys show a normal enhancement pattern. Mild atrophic changes are noted. Scattered cysts are seen which appear simple in nature. The largest of these measures up to 5.6 cm in the left kidney. These are stable in appearance from prior exam. Bladder is decompressed. Stomach/Bowel: The appendix is within normal limits. No obstructive or inflammatory changes of the colon are seen. Small bowel and stomach show evidence of a small hiatal hernia. Vascular/Lymphatic: Aortic atherosclerosis. No enlarged abdominal or pelvic lymph nodes. Reproductive: Status post hysterectomy. No adnexal masses. Other: No abdominal wall hernia or abnormality. No abdominopelvic ascites. Musculoskeletal: No acute bony abnormality is noted. Degenerative changes of the lumbar spine are seen. There is clinical history of pressure sores in the buttocks bilaterally although no definitive lesion is noted. Review of the MIP images confirms the above findings. IMPRESSION: CTA of the chest: No evidence of pulmonary embolism. Dependent atelectatic changes bilaterally. CT of the abdomen and pelvis: Fatty infiltration of the liver. Mild renal atrophy with stable appearing simple cysts. No follow-up is recommended. No other focal abnormality is seen. Aortic Atherosclerosis (ICD10-I70.0). Electronically Signed   By: Inez Catalina M.D.   On: 05/21/2022 21:36   CT ABDOMEN PELVIS W CONTRAST  Result Date: 05/21/2022 CLINICAL DATA:  Weakness EXAM: CT ANGIOGRAPHY CHEST CT ABDOMEN AND PELVIS WITH CONTRAST TECHNIQUE: Multidetector CT imaging of the chest was performed using the standard protocol during bolus administration of intravenous contrast. Multiplanar CT image reconstructions and MIPs were obtained to evaluate the vascular anatomy. Multidetector CT imaging of the abdomen and pelvis was performed using the standard protocol  during bolus administration of intravenous contrast. RADIATION DOSE REDUCTION: This exam was performed according to the departmental dose-optimization program which includes automated exposure control, adjustment of the mA and/or kV according to patient size and/or use of iterative reconstruction technique.  CONTRAST:  181mL OMNIPAQUE IOHEXOL 350 MG/ML SOLN COMPARISON:  02/12/2022, FINDINGS: CTA CHEST FINDINGS Cardiovascular: Atherosclerotic calcifications of the thoracic aorta are noted. No aneurysmal dilatation or definitive dissection is seen. Coronary calcifications are noted. Mild cardiomegaly is seen. The pulmonary artery shows a normal branching pattern. No filling defect to suggest pulmonary embolism is noted. Mediastinum/Nodes: Thoracic inlet is within normal limits. No hilar or mediastinal adenopathy is noted. The esophagus as visualized is within normal limits. Lungs/Pleura: Mild dependent atelectatic changes are noted in the lungs bilaterally. No focal infiltrate or sizable effusion is seen. No parenchymal nodules are noted. Musculoskeletal: Degenerative changes of the thoracic spine are seen. No acute rib abnormality is noted. No compression deformity is seen. Review of the MIP images confirms the above findings. CT ABDOMEN and PELVIS FINDINGS Hepatobiliary: Mild fatty infiltration of the liver is noted. Gallbladder is within normal limits. Pancreas: Unremarkable. No pancreatic ductal dilatation or surrounding inflammatory changes. Spleen: Normal in size without focal abnormality. Adrenals/Urinary Tract: Adrenal glands are unremarkable. Kidneys show a normal enhancement pattern. Mild atrophic changes are noted. Scattered cysts are seen which appear simple in nature. The largest of these measures up to 5.6 cm in the left kidney. These are stable in appearance from prior exam. Bladder is decompressed. Stomach/Bowel: The appendix is within normal limits. No obstructive or inflammatory changes of the colon  are seen. Small bowel and stomach show evidence of a small hiatal hernia. Vascular/Lymphatic: Aortic atherosclerosis. No enlarged abdominal or pelvic lymph nodes. Reproductive: Status post hysterectomy. No adnexal masses. Other: No abdominal wall hernia or abnormality. No abdominopelvic ascites. Musculoskeletal: No acute bony abnormality is noted. Degenerative changes of the lumbar spine are seen. There is clinical history of pressure sores in the buttocks bilaterally although no definitive lesion is noted. Review of the MIP images confirms the above findings. IMPRESSION: CTA of the chest: No evidence of pulmonary embolism. Dependent atelectatic changes bilaterally. CT of the abdomen and pelvis: Fatty infiltration of the liver. Mild renal atrophy with stable appearing simple cysts. No follow-up is recommended. No other focal abnormality is seen. Aortic Atherosclerosis (ICD10-I70.0). Electronically Signed   By: Inez Catalina M.D.   On: 05/21/2022 21:36    EKG: Independently reviewed. 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     DMII -diet controlled  -place on finger sticks    ESRD on  HD TTS -renal consult   CAD s/p stent -continue zetia/statin /asa daily  -no further cardiac medications due to inability to tolerate which appears to be related to hypotensive episodes   Left knee arthritis  Chronic lower back pain -supportive care with tylenol  GERD -ppi  Obesity    DVT prophylaxis: heparin  Code Status: full Family Communication: none at  bedside Disposition Plan: patient  expected to be admitted greater than 2 midnights  Consults called: n/a Admission status: med tele   Clance Boll MD [facility]  If 7PM-7AM, please contact night-coverage [email or web address] Password [facility]  05/21/2022, 11:25 PM

## 2022-05-21 NOTE — ED Notes (Signed)
Patient noted to have stage 2 pressure ulcer on middle of her buttocks at this time

## 2022-05-21 NOTE — ED Provider Notes (Signed)
[facility] Provider Note    Event Date/Time   First MD Initiated Contact with Patient 05/21/22 2023     (approximate)   History   Weakness and Wound Check   HPI  Jacqueline [name] is a 76 y.o. female with a past medical history of ESRD on HD Tuesday Thursday Saturday without any recently missed sessions, CHF, PVD, CAD, HDL, DM, arthritis, atherosclerosis, who presents accompanied by family for evaluation of several concerns.  Patient states she is worried about wounds on her back and was advised by her PCP to come to the emergency room to have this evaluated.  She states she has been weaker than usual over the last couple weeks and per family and patient since most of her time in recliner.  She states he had a little diarrhea today but otherwise has not any significant diarrhea she does not make any more urine.  She has not had any acute abdominal pain, back pain, chest pain or cough but has chronic dyspnea.  She states she wears oxygen as needed during dialysis.  She states she is supposed to wear CPAP at night but does not and is not sure when she stopped.  She denies any recent falls or injuries or focal weakness.  Per family at bedside who is taking care of the patient at this time they feel it cannot take care of the patient as both patient's son and spouse after work during the day patient was unable to get out of her recliner without assistance.      Physical Exam  Triage Vital Signs: ED Triage Vitals [05/21/22 1926]  Enc Vitals Group     BP (!) 113/56     Pulse Rate 79     Resp 18     Temp 98.8 F (37.1 C)     Temp Source Oral     SpO2 96 %     Weight 227 lb 1.2 oz (103 kg)     Height 5\' 4"  (1.626 m)     Head Circumference      Peak Flow      Pain Score 10     Pain Loc      Pain Edu?      Excl. in [place]?     Most recent vital signs: Vitals:   05/21/22 2100 05/21/22 2135  BP: 122/66 95/74  Pulse:  94  Resp:  19  Temp:    SpO2:  96%     General: Awake, no distress.  CV:  Good peripheral perfusion.  2+ radial pulses. Resp:  Normal effort.  Clear bilaterally. Abd:  No distention.  Soft. Other:  Patient's moving all extremities spontaneously.  Sensation is intact light touch throughout.  PERRLA.  EOMI.  Stage II sacral wound without significant induration or any purulence or bleeding.   ED Results / Procedures / Treatments  Labs (all labs ordered are listed, but only abnormal results are displayed) Labs Reviewed  CBC WITH DIFFERENTIAL/PLATELET - Abnormal; Notable for the following components:      Result Value   WBC 12.3 (*)    Neutro Abs 9.6 (*)    Monocytes Absolute 1.2 (*)    All other components within normal limits  COMPREHENSIVE METABOLIC PANEL - Abnormal; Notable for the following components:   Chloride 96 (*)    Glucose, Bld 122 (*)    Creatinine, Ser 3.98 (*)    GFR, Estimated 11 (*)    All other components within  normal limits  BRAIN NATRIURETIC PEPTIDE - Abnormal; Notable for the following components:   B Natriuretic Peptide 190.6 (*)    All other components within normal limits  TROPONIN I (HIGH SENSITIVITY) - Abnormal; Notable for the following components:   Troponin I (High Sensitivity) 67 (*)    All other components within normal limits  TROPONIN I (HIGH SENSITIVITY) - Abnormal; Notable for the following components:   Troponin I (High Sensitivity) 67 (*)    All other components within normal limits  PROCALCITONIN  BLOOD GAS, ARTERIAL  TSH     EKG  ECG is remarkable for sinus tachycardia with ventricular rate of 130, first-degree block with a PR interval of 376 with otherwise nonspecific change in V2 and no other clear evidence of acute ischemia or significant arrhythmia.  RADIOLOGY CT PE study as well as CT abdomen pelvis my interpretation without evidence of PE, large effusion, edema, pneumothorax, abdominal abscess or any deeper space infection in the subcutaneous sacral tissue.  I  reviewed radiology's interpretation and agree to findings of stable appearing renal cysts and aortic atherosclerosis without other acute thoracic abdominal pelvic process.  There is notation of fatty infiltration of the liver.  I also agree with notation of some atrophy of the kidneys  PROCEDURES:  Critical Care performed: No  Procedures    MEDICATIONS ORDERED IN ED: Medications  iohexol (OMNIPAQUE) 350 MG/ML injection 100 mL (100 mLs Intravenous Contrast Given 05/21/22 2118)  acetaminophen (TYLENOL) tablet 1,000 mg (1,000 mg Oral Given 05/21/22 2246)     IMPRESSION / MDM / ASSESSMENT AND PLAN / ED COURSE  I reviewed the triage vital signs and the nursing notes. Patient's presentation is most consistent with acute presentation with potential threat to life or bodily function.                               Differential diagnosis includes, but is not limited to worsening generalized weakness secondary to a PE, arrhythmia, anemia, metabolic derangements and acute infectious process.  While her sacral wounds do not appear obviously infected she does have a leukocytosis without other clear source and obtain a CT to rule out deeper pelvic infection.  No clear focal deficits at this time to suggest a CVA or any history of recent trauma.  ECG is remarkable for sinus tachycardia with ventricular rate of 130, first-degree block with a PR interval of 376 with otherwise nonspecific change in V2 and no other clear evidence of acute ischemia or significant arrhythmia.  Troponins are elevated but stable at 67 and 67 which I suspect represents a mild demand versus chronic troponinemia in the setting of CKD with lower suspicion for an occlusion MI at this time.  CMP shows no significant lecture metabolic derangements and kidney function consistent with history of ESRD.  CBC shows WC count of 12.3 with no acute anemia and normal platelets.  BNP is 198 compared to 932 5 months ago and overall low suspicion for  acute volume overload at this time.  Procalcitonin is 0.59.  CT PE study as well as CT abdomen pelvis my interpretation without evidence of PE, large effusion, edema, pneumothorax, abdominal abscess or any deeper space infection in the subcutaneous sacral tissue.  I reviewed radiology's interpretation and agree to findings of stable appearing renal cysts and aortic atherosclerosis without other acute thoracic abdominal pelvic process.  There is notation of fatty infiltration of the liver.  I also agree  with notation of some atrophy of the kidneys.  On further questioning patient states that she used to wear oxygen at night and only wears it during dialysis.  Having somewhat difficult time obtaining plethysmography no pain in room air ABG to determine if there is any evidence of hypoxic respiratory failure and if there is a suspect is likely from some atelectasis and obesity hypoventilation.  Given family does not feel they can safely take care of her with progressive weakness I will admit to medicine service for further evaluation and management.      FINAL CLINICAL IMPRESSION(S) / ED DIAGNOSES   Final diagnoses:  Wound of sacral region, initial encounter  Weakness  Troponin I above reference range     Rx / DC Orders   ED Discharge Orders     None        Note:  This document was prepared using Dragon voice recognition software and may include unintentional dictation errors.   Lucrezia Starch, MD 05/21/22 2251

## 2022-05-21 NOTE — H&P (Incomplete)
History and Physical    Jacqueline [name] RXV:[id number] DOB: 02-Aug-1946 DOA: 05/21/2022  PCP: [name], FNP  Patient coming from: home  I have personally briefly reviewed patient's old medical records in [place]  Chief Complaint: progressive debility /placement  HPI: Jacqueline [name] is a 76 y.o. female with medical history significant of  HTN, DMII,ESRD on HD TTS, Chronic debility , CAD s/p stent, Left knee arthritis, Chronic lower back pain,GERD,Obesity , HLD, who presents to ED brought in by family due to progression of chronic debility and family inability to take care of patient in addition to wound check.  Patient states she had had difficulty getting around for years however however the last 9 months she has had continued decline and now unable to get up from a seated position without assistance. She states she recently started physical therapy over the last few week but has noted no improvement.  She notes she has no home health aide and her son and daughter in law assist her with all her ADLS. She states she is only able to feed her self. She also note that her husband recently died in 2023-05-15 and he was her primary care giver.  Currently her daughter and son in law work and are unable to be there with her and so she sits in a chair until they return from work. Due to these she has developed prclose to one year  ED Course: ***  Review of Systems: As per HPI otherwise 10 point review of systems negative.   Past Medical History:  Diagnosis Date  . Arthritis    "left knee" (12/04/2016)  . CAD (coronary artery disease)    a. 12/2016 NSTEMI/PCI: LM nl, LaD 20p/d, D1 20, OM2 20, OM3 20, RCA 68m (3.0x18 Resolute DES). EF 65%.  . Chronic lower back pain   . Diverticulosis    on CT  . ESRD on dialysis [facility])    "[facility]; [place]; TTS" (12/04/2016)  . GERD (gastroesophageal reflux disease)   . Hiatal hernia   . Hyperlipidemia   . Hypertension    a. 12/2016  labetalol d/c'd 2/2 hypotension in HD.  . Morbid obesity ([place])   . Obesity   . Pneumonia    "couple times" (12/04/2016)  . Renal insufficiency   . Type II diabetes mellitus ([place])     Past Surgical History:  Procedure Laterality Date  . ABDOMINAL HYSTERECTOMY    . AV FISTULA PLACEMENT Left 01/27/2014   Procedure: ARTERIOVENOUS (AV) FISTULA CREATION;  Surgeon: Mal Misty, MD;  Location: [place];  Service: Vascular;  Laterality: Left;  . AV FISTULA REPAIR     "had it cleaned out"  . CARDIAC CATHETERIZATION  ?1980s  . CARDIAC CATHETERIZATION N/A 12/04/2016   Procedure: Left Heart Cath Angiography;  Surgeon: Burnell Blanks, MD;  Location: [place] CV LAB;  Service: Cardiovascular;  Laterality: N/A;  . CARDIAC CATHETERIZATION N/A 12/04/2016   Procedure: Coronary Stent Intervention;  Surgeon: Burnell Blanks, MD;  Location: [place] CV LAB;  Service: Cardiovascular;  Laterality: N/A;  Mid RCA  . CATARACT EXTRACTION W/ INTRAOCULAR LENS IMPLANT Left   . COLONOSCOPY W/ BIOPSIES AND POLYPECTOMY    . CORONARY ANGIOPLASTY WITH STENT PLACEMENT  12/04/2016  . ESOPHAGOGASTRODUODENOSCOPY    . INSERTION OF DIALYSIS CATHETER N/A 01/19/2014   Procedure: INSERTION OF DIALYSIS CATHETER;  Surgeon: Rosetta Posner, MD;  Location: [place];  Service: Vascular;  Laterality: N/A;  reports that she quit smoking about 38 years ago. Her smoking use included cigarettes. She has a 20.00 pack-year smoking history. She has never used smokeless tobacco. She reports that she does not drink alcohol and does not use drugs.  Allergies  Allergen Reactions  . Other Other (See Comments)  . Sulfa Antibiotics Other (See Comments)  . Sulfa Drugs Cross Reactors Other (See Comments)    Doesn't remember     Family History  Problem Relation Age of Onset  . Diabetes Mother   . Peripheral vascular disease Mother        amputation  . CAD Father   . Heart disease Father   . Hypertension Father   . Heart attack  Father   . CAD Sister   . Cancer Brother        x 2 brothers, type unknown  . Breast cancer Sister    *** Prior to Admission medications   Medication Sig Start Date End Date Taking? Authorizing Provider  acetaminophen (TYLENOL) 500 MG tablet Take 1,000 mg by mouth every 6 (six) hours as needed for moderate pain or headache.   Yes [provider]  albuterol (VENTOLIN HFA) 108 (90 Base) MCG/ACT inhaler Inhale 1-2 puffs into the lungs every 6 (six) hours as needed for wheezing or shortness of breath. 06/01/15  Yes Campbell Stall P, DO  aspirin EC 81 MG EC tablet Take 1 tablet (81 mg total) by mouth daily. 12/06/16  Yes Vann, Jessica U, DO  AURYXIA 1 GM 210 MG(Fe) tablet Take 420 mg by mouth 2 (two) times a day. 06/08/19  Yes [provider]  B Complex-C-Zn-Folic Acid (DIALYVITE/ZINC) TABS Take 1 tablet by mouth daily. 02/09/22  Yes [provider]  cinacalcet (SENSIPAR) 30 MG tablet Take 30 mg by mouth daily. 08/23/21  Yes [provider]  Doxercalciferol (HECTOROL IV) Dialysis Tuesday,Thursday and saturday 08/14/21 08/13/22 Yes [provider]  ezetimibe (ZETIA) 10 MG tablet Take 1 tablet (10 mg total) by mouth daily. 04/30/22  Yes Wellington Hampshire, MD  midodrine (PROAMATINE) 10 MG tablet Take 10 mg by mouth See admin instructions. Dialysis days [facility] Saturday   Yes [provider]  omeprazole (PRILOSEC) 20 MG capsule Take 1 capsule (20 mg total) by mouth daily. 02/21/22  Yes [name], FNP  rosuvastatin (CRESTOR) 40 MG tablet TAKE 1 TABLET BY MOUTH DAILY 04/30/22  Yes Wellington Hampshire, MD  Vitamin D, Ergocalciferol, 50 MCG (2000 UT) CAPS Take 2,000 Units by mouth daily.   Yes [provider]  Omega-3 Fatty Acids (FISH OIL) 1000 MG CPDR Take 1,000 mg by mouth daily.  Patient not taking: Reported on 04/26/2022    [provider]    Physical Exam: Vitals:   05/21/22 2035 05/21/22 2100 05/21/22 2135 05/21/22 2249  BP: (!)  144/70 122/66 95/74 110/70  Pulse: 86  94 83  Resp: 17  19 19   Temp:      TempSrc:      SpO2: 97%  96% 98%  Weight:      Height:        Constitutional: NAD, calm, comfortable Vitals:   05/21/22 2035 05/21/22 2100 05/21/22 2135 05/21/22 2249  BP: (!) 144/70 122/66 95/74 110/70  Pulse: 86  94 83  Resp: 17  19 19   Temp:      TempSrc:      SpO2: 97%  96% 98%  Weight:      Height:  Eyes: PERRL, lids and conjunctivae normal ENMT: Mucous membranes are moist. Posterior pharynx clear of any exudate or lesions.Normal dentition.  Neck: normal, supple, no masses, no thyromegaly Respiratory: clear to auscultation bilaterally, no wheezing, no crackles. Normal respiratory effort. No accessory muscle use.  Cardiovascular: Regular rate and rhythm, no murmurs / rubs / gallops. No extremity edema. 2+ pedal pulses. No carotid bruits.  Abdomen: no tenderness, no masses palpated. No hepatosplenomegaly. Bowel sounds positive.  Musculoskeletal: no clubbing / cyanosis. No joint deformity upper and lower extremities. Good ROM, no contractures. Normal muscle tone.  Skin: no rashes, lesions, ulcers. No induration Neurologic: CN 2-12 grossly intact. Sensation intact, DTR normal. Strength 5/5 in all 4.  Psychiatric: Normal judgment and insight. Alert and oriented x 3. Normal mood.    Labs on Admission: I have personally reviewed following labs and imaging studies  CBC: Recent Labs  Lab 05/21/22 1931  WBC 12.3*  NEUTROABS 9.6*  HGB 12.6  HCT 41.0  MCV 96.7  PLT 262   Basic Metabolic Panel: Recent Labs  Lab 05/21/22 1931  NA 138  K 4.2  CL 96*  CO2 28  GLUCOSE 122*  BUN 16  CREATININE 3.98*  CALCIUM 9.4   GFR: Estimated Creatinine Clearance: 13.6 mL/min (A) (by C-G formula based on SCr of 3.98 mg/dL (H)). Liver Function Tests: Recent Labs  Lab 05/21/22 1931  AST 36  ALT 20  ALKPHOS 84  BILITOT 1.0  PROT 8.1  ALBUMIN 3.5   No results for input(s): "LIPASE", "AMYLASE" in  the last 168 hours. No results for input(s): "AMMONIA" in the last 168 hours. Coagulation Profile: No results for input(s): "INR", "PROTIME" in the last 168 hours. Cardiac Enzymes: No results for input(s): "CKTOTAL", "CKMB", "CKMBINDEX", "TROPONINI" in the last 168 hours. BNP (last 3 results) No results for input(s): "PROBNP" in the last 8760 hours. HbA1C: No results for input(s): "HGBA1C" in the last 72 hours. CBG: No results for input(s): "GLUCAP" in the last 168 hours. Lipid Profile: No results for input(s): "CHOL", "HDL", "LDLCALC", "TRIG", "CHOLHDL", "LDLDIRECT" in the last 72 hours. Thyroid Function Tests: No results for input(s): "TSH", "T4TOTAL", "FREET4", "T3FREE", "THYROIDAB" in the last 72 hours. Anemia Panel: No results for input(s): "VITAMINB12", "FOLATE", "FERRITIN", "TIBC", "IRON", "RETICCTPCT" in the last 72 hours. Urine analysis:    Component Value Date/Time   COLORURINE YELLOW 01/25/2014 1903   APPEARANCEUR TURBID (A) 01/25/2014 1903   LABSPEC 1.020 01/25/2014 1903   PHURINE 5.0 01/25/2014 1903   GLUCOSEU NEGATIVE 01/25/2014 1903   HGBUR LARGE (A) 01/25/2014 1903   BILIRUBINUR SMALL (A) 01/25/2014 1903   KETONESUR NEGATIVE 01/25/2014 1903   PROTEINUR 100 (A) 01/25/2014 1903   UROBILINOGEN 1.0 01/25/2014 1903   NITRITE NEGATIVE 01/25/2014 1903   LEUKOCYTESUR LARGE (A) 01/25/2014 1903    Radiological Exams on Admission: CT Angio Chest PE W and/or Wo Contrast  Result Date: 05/21/2022 CLINICAL DATA:  Weakness EXAM: CT ANGIOGRAPHY CHEST CT ABDOMEN AND PELVIS WITH CONTRAST TECHNIQUE: Multidetector CT imaging of the chest was performed using the standard protocol during bolus administration of intravenous contrast. Multiplanar CT image reconstructions and MIPs were obtained to evaluate the vascular anatomy. Multidetector CT imaging of the abdomen and pelvis was performed using the standard protocol during bolus administration of intravenous contrast. RADIATION DOSE  REDUCTION: This exam was performed according to the departmental dose-optimization program which includes automated exposure control, adjustment of the mA and/or kV according to patient size and/or use of iterative reconstruction technique. CONTRAST:  1106mL OMNIPAQUE IOHEXOL 350 MG/ML SOLN COMPARISON:  02/12/2022, FINDINGS: CTA CHEST FINDINGS Cardiovascular: Atherosclerotic calcifications of the thoracic aorta are noted. No aneurysmal dilatation or definitive dissection is seen. Coronary calcifications are noted. Mild cardiomegaly is seen. The pulmonary artery shows a normal branching pattern. No filling defect to suggest pulmonary embolism is noted. Mediastinum/Nodes: Thoracic inlet is within normal limits. No hilar or mediastinal adenopathy is noted. The esophagus as visualized is within normal limits. Lungs/Pleura: Mild dependent atelectatic changes are noted in the lungs bilaterally. No focal infiltrate or sizable effusion is seen. No parenchymal nodules are noted. Musculoskeletal: Degenerative changes of the thoracic spine are seen. No acute rib abnormality is noted. No compression deformity is seen. Review of the MIP images confirms the above findings. CT ABDOMEN and PELVIS FINDINGS Hepatobiliary: Mild fatty infiltration of the liver is noted. Gallbladder is within normal limits. Pancreas: Unremarkable. No pancreatic ductal dilatation or surrounding inflammatory changes. Spleen: Normal in size without focal abnormality. Adrenals/Urinary Tract: Adrenal glands are unremarkable. Kidneys show a normal enhancement pattern. Mild atrophic changes are noted. Scattered cysts are seen which appear simple in nature. The largest of these measures up to 5.6 cm in the left kidney. These are stable in appearance from prior exam. Bladder is decompressed. Stomach/Bowel: The appendix is within normal limits. No obstructive or inflammatory changes of the colon are seen. Small bowel and stomach show evidence of a small hiatal  hernia. Vascular/Lymphatic: Aortic atherosclerosis. No enlarged abdominal or pelvic lymph nodes. Reproductive: Status post hysterectomy. No adnexal masses. Other: No abdominal wall hernia or abnormality. No abdominopelvic ascites. Musculoskeletal: No acute bony abnormality is noted. Degenerative changes of the lumbar spine are seen. There is clinical history of pressure sores in the buttocks bilaterally although no definitive lesion is noted. Review of the MIP images confirms the above findings. IMPRESSION: CTA of the chest: No evidence of pulmonary embolism. Dependent atelectatic changes bilaterally. CT of the abdomen and pelvis: Fatty infiltration of the liver. Mild renal atrophy with stable appearing simple cysts. No follow-up is recommended. No other focal abnormality is seen. Aortic Atherosclerosis (ICD10-I70.0). Electronically Signed   By: Inez Catalina M.D.   On: 05/21/2022 21:36   CT ABDOMEN PELVIS W CONTRAST  Result Date: 05/21/2022 CLINICAL DATA:  Weakness EXAM: CT ANGIOGRAPHY CHEST CT ABDOMEN AND PELVIS WITH CONTRAST TECHNIQUE: Multidetector CT imaging of the chest was performed using the standard protocol during bolus administration of intravenous contrast. Multiplanar CT image reconstructions and MIPs were obtained to evaluate the vascular anatomy. Multidetector CT imaging of the abdomen and pelvis was performed using the standard protocol during bolus administration of intravenous contrast. RADIATION DOSE REDUCTION: This exam was performed according to the departmental dose-optimization program which includes automated exposure control, adjustment of the mA and/or kV according to patient size and/or use of iterative reconstruction technique. CONTRAST:  145mL OMNIPAQUE IOHEXOL 350 MG/ML SOLN COMPARISON:  02/12/2022, FINDINGS: CTA CHEST FINDINGS Cardiovascular: Atherosclerotic calcifications of the thoracic aorta are noted. No aneurysmal dilatation or definitive dissection is seen. Coronary  calcifications are noted. Mild cardiomegaly is seen. The pulmonary artery shows a normal branching pattern. No filling defect to suggest pulmonary embolism is noted. Mediastinum/Nodes: Thoracic inlet is within normal limits. No hilar or mediastinal adenopathy is noted. The esophagus as visualized is within normal limits. Lungs/Pleura: Mild dependent atelectatic changes are noted in the lungs bilaterally. No focal infiltrate or sizable effusion is seen. No parenchymal nodules are noted. Musculoskeletal: Degenerative changes of the thoracic spine are seen. No acute rib  abnormality is noted. No compression deformity is seen. Review of the MIP images confirms the above findings. CT ABDOMEN and PELVIS FINDINGS Hepatobiliary: Mild fatty infiltration of the liver is noted. Gallbladder is within normal limits. Pancreas: Unremarkable. No pancreatic ductal dilatation or surrounding inflammatory changes. Spleen: Normal in size without focal abnormality. Adrenals/Urinary Tract: Adrenal glands are unremarkable. Kidneys show a normal enhancement pattern. Mild atrophic changes are noted. Scattered cysts are seen which appear simple in nature. The largest of these measures up to 5.6 cm in the left kidney. These are stable in appearance from prior exam. Bladder is decompressed. Stomach/Bowel: The appendix is within normal limits. No obstructive or inflammatory changes of the colon are seen. Small bowel and stomach show evidence of a small hiatal hernia. Vascular/Lymphatic: Aortic atherosclerosis. No enlarged abdominal or pelvic lymph nodes. Reproductive: Status post hysterectomy. No adnexal masses. Other: No abdominal wall hernia or abnormality. No abdominopelvic ascites. Musculoskeletal: No acute bony abnormality is noted. Degenerative changes of the lumbar spine are seen. There is clinical history of pressure sores in the buttocks bilaterally although no definitive lesion is noted. Review of the MIP images confirms the above  findings. IMPRESSION: CTA of the chest: No evidence of pulmonary embolism. Dependent atelectatic changes bilaterally. CT of the abdomen and pelvis: Fatty infiltration of the liver. Mild renal atrophy with stable appearing simple cysts. No follow-up is recommended. No other focal abnormality is seen. Aortic Atherosclerosis (ICD10-I70.0). Electronically Signed   By: Inez Catalina M.D.   On: 05/21/2022 21:36    EKG: Independently reviewed. ***  Assessment/Plan Principal Problem:   Weakness   ***  DVT prophylaxis: *** (Lovenox/Heparin/SCD's/anticoagulated/None (if comfort care) Code Status: *** (Full/Partial (specify details) Family Communication: *** (Specify name, relationship. Do not write "discussed with patient". Specify tel # if discussed over the phone) Disposition Plan: *** (specify when and where you expect patient to be discharged) Consults called: *** (with names) Admission status: *** (inpatient / obs / tele / medical floor / SDU)   Clance Boll MD [facility] Pager [phone]  If 7PM-7AM, please contact night-coverage [email or web address] Password [facility]  05/21/2022, 11:25 PM

## 2022-05-21 NOTE — ED Notes (Signed)
This rn called respiratory for abg

## 2022-05-21 NOTE — ED Triage Notes (Signed)
Pt presents to ER c/o weakness, and wound problems.  Pt states weakness started this morning, and states she is having trouble getting up on her own.  Pt also states she has wounds to and between her buttocks that her MD told her to get checked out here.  Pt is a T, Th, Sat dialysis pt and states she has not missed any treatments.  Pt A&O x4 at this time in NAD in triage.

## 2022-05-22 ENCOUNTER — Encounter: Payer: Self-pay | Admitting: Internal Medicine

## 2022-05-22 ENCOUNTER — Telehealth: Payer: Self-pay

## 2022-05-22 DIAGNOSIS — N186 End stage renal disease: Secondary | ICD-10-CM

## 2022-05-22 DIAGNOSIS — L89153 Pressure ulcer of sacral region, stage 3: Secondary | ICD-10-CM | POA: Diagnosis not present

## 2022-05-22 DIAGNOSIS — I959 Hypotension, unspecified: Secondary | ICD-10-CM | POA: Diagnosis not present

## 2022-05-22 DIAGNOSIS — L8990 Pressure ulcer of unspecified site, unspecified stage: Secondary | ICD-10-CM | POA: Insufficient documentation

## 2022-05-22 DIAGNOSIS — Z992 Dependence on renal dialysis: Secondary | ICD-10-CM

## 2022-05-22 DIAGNOSIS — R531 Weakness: Secondary | ICD-10-CM | POA: Diagnosis not present

## 2022-05-22 DIAGNOSIS — I132 Hypertensive heart and chronic kidney disease with heart failure and with stage 5 chronic kidney disease, or end stage renal disease: Secondary | ICD-10-CM | POA: Diagnosis not present

## 2022-05-22 DIAGNOSIS — K5900 Constipation, unspecified: Secondary | ICD-10-CM | POA: Diagnosis not present

## 2022-05-22 DIAGNOSIS — E1122 Type 2 diabetes mellitus with diabetic chronic kidney disease: Secondary | ICD-10-CM | POA: Diagnosis not present

## 2022-05-22 DIAGNOSIS — I509 Heart failure, unspecified: Secondary | ICD-10-CM | POA: Diagnosis not present

## 2022-05-22 DIAGNOSIS — D631 Anemia in chronic kidney disease: Secondary | ICD-10-CM | POA: Diagnosis not present

## 2022-05-22 DIAGNOSIS — D72829 Elevated white blood cell count, unspecified: Secondary | ICD-10-CM | POA: Diagnosis not present

## 2022-05-22 DIAGNOSIS — K219 Gastro-esophageal reflux disease without esophagitis: Secondary | ICD-10-CM | POA: Diagnosis not present

## 2022-05-22 LAB — CBC
HCT: 36.9 % (ref 36.0–46.0)
Hemoglobin: 11.5 g/dL — ABNORMAL LOW (ref 12.0–15.0)
MCH: 29.6 pg (ref 26.0–34.0)
MCHC: 31.2 g/dL (ref 30.0–36.0)
MCV: 94.9 fL (ref 80.0–100.0)
Platelets: 194 10*3/uL (ref 150–400)
RBC: 3.89 MIL/uL (ref 3.87–5.11)
RDW: 14.6 % (ref 11.5–15.5)
WBC: 9.9 10*3/uL (ref 4.0–10.5)
nRBC: 0 % (ref 0.0–0.2)

## 2022-05-22 LAB — GLUCOSE, CAPILLARY
Glucose-Capillary: 75 mg/dL (ref 70–99)
Glucose-Capillary: 99 mg/dL (ref 70–99)
Glucose-Capillary: 100 mg/dL — ABNORMAL HIGH (ref 70–99)
Glucose-Capillary: 136 mg/dL — ABNORMAL HIGH (ref 70–99)

## 2022-05-22 LAB — CREATININE, SERUM
Creatinine, Ser: 5.04 mg/dL — ABNORMAL HIGH (ref 0.44–1.00)
GFR, Estimated: 8 mL/min — ABNORMAL LOW (ref >60)

## 2022-05-22 LAB — SEDIMENTATION RATE: Sed Rate: 35 mm/hr — ABNORMAL HIGH (ref 0–30)

## 2022-05-22 LAB — HEMOGLOBIN A1C
Hgb A1c MFr Bld: 5.9 % — ABNORMAL HIGH (ref 4.8–5.6)
Mean Plasma Glucose: 122.63 mg/dL

## 2022-05-22 LAB — TSH: TSH: 2.44 u[IU]/mL (ref 0.350–4.500)

## 2022-05-22 MED ORDER — ONDANSETRON HCL 4 MG PO TABS: 4.0000 mg | ORAL_TABLET | Freq: Four times a day (QID) | ORAL | Status: DC | PRN

## 2022-05-22 MED ORDER — ACETAMINOPHEN 500 MG PO TABS
1000.0000 mg | ORAL_TABLET | Freq: Four times a day (QID) | ORAL | Status: DC | PRN
  Administered 2022-05-22 – 2022-05-29 (×11): 1000 mg via ORAL
  Filled 2022-05-22 (×11): qty 2

## 2022-05-22 MED ORDER — PANTOPRAZOLE SODIUM 40 MG PO TBEC
40.0000 mg | DELAYED_RELEASE_TABLET | Freq: Every day | ORAL | Status: DC
  Administered 2022-05-22 – 2022-05-30 (×8): 40 mg via ORAL
  Filled 2022-05-22 (×8): qty 1

## 2022-05-22 MED ORDER — CINACALCET HCL 30 MG PO TABS
30.0000 mg | ORAL_TABLET | Freq: Every day | ORAL | Status: DC
  Administered 2022-05-22 – 2022-05-30 (×9): 30 mg via ORAL
  Filled 2022-05-22 (×9): qty 1

## 2022-05-22 MED ORDER — EZETIMIBE 10 MG PO TABS
10.0000 mg | ORAL_TABLET | Freq: Every day | ORAL | Status: DC
  Administered 2022-05-22 – 2022-05-30 (×8): 10 mg via ORAL
  Filled 2022-05-22 (×9): qty 1

## 2022-05-22 MED ORDER — ASPIRIN 81 MG PO TBEC
81.0000 mg | DELAYED_RELEASE_TABLET | Freq: Every day | ORAL | Status: DC
  Administered 2022-05-22 – 2022-05-30 (×8): 81 mg via ORAL
  Filled 2022-05-22 (×8): qty 1

## 2022-05-22 MED ORDER — HEPARIN SODIUM (PORCINE) 5000 UNIT/ML IJ SOLN
5000.0000 [IU] | Freq: Three times a day (TID) | INTRAMUSCULAR | Status: DC
Start: 2022-05-22 — End: 2022-05-30
  Administered 2022-05-22 – 2022-05-30 (×18): 5000 [IU] via SUBCUTANEOUS
  Filled 2022-05-22 (×20): qty 1

## 2022-05-22 MED ORDER — MIDODRINE HCL 5 MG PO TABS
10.0000 mg | ORAL_TABLET | ORAL | Status: DC
Start: 2022-05-23 — End: 2022-05-30
  Administered 2022-05-23 – 2022-05-30 (×5): 10 mg via ORAL
  Filled 2022-05-22 (×7): qty 2

## 2022-05-22 MED ORDER — FERRIC CITRATE 1 GM 210 MG(FE) PO TABS
420.0000 mg | ORAL_TABLET | Freq: Two times a day (BID) | ORAL | Status: DC
  Administered 2022-05-22 – 2022-05-29 (×14): 420 mg via ORAL
  Filled 2022-05-22 (×15): qty 2

## 2022-05-22 MED ORDER — ROSUVASTATIN CALCIUM 10 MG PO TABS
40.0000 mg | ORAL_TABLET | Freq: Every day | ORAL | Status: DC
  Administered 2022-05-22 – 2022-05-30 (×8): 40 mg via ORAL
  Filled 2022-05-22 (×8): qty 4

## 2022-05-22 MED ORDER — ONDANSETRON HCL 4 MG/2ML IJ SOLN
4.0000 mg | Freq: Four times a day (QID) | INTRAMUSCULAR | Status: DC | PRN
  Administered 2022-05-23 – 2022-05-30 (×3): 4 mg via INTRAVENOUS
  Filled 2022-05-22 (×3): qty 2

## 2022-05-22 MED ORDER — ALBUTEROL SULFATE (2.5 MG/3ML) 0.083% IN NEBU: 2.5000 mg | INHALATION_SOLUTION | RESPIRATORY_TRACT | Status: DC | PRN

## 2022-05-22 NOTE — Consult Note (Signed)
[facility] Nurse Consult Note: Reason for Consult: Stage 3 coccyx pressure injury. Moisture associated skin damage to bilateral upper gluteal areas.  Wound type:pressure and moisture Pressure Injury POA: Yes Measurement: coccyx:  0.3 cm round opening with depth 0.2 cm  Bilateral gluteal folds with mirror image open areas to each side:  1 cm x 1 cm x 0.1 cm  Wound bed: pink, moist Drainage (amount, consistency, odor) minimal serosanguinous  no odor Periwound: dry skin Dressing procedure/placement/frequency: Cleanse coccyx wound with NS and pat dry.  Apply silicone foam to conform to dead space and promote healing.  Cleanse buttocks with soap and water  Barrier cream twice daily and PRN soilage.  No disposable briefs or underpads.  Turn and reposition.  Will not follow at this time.  Please re-consult if needed.  Domenic Moras MSN, RN, FNP-BC CWON Wound, Ostomy, Continence Nurse Pager [phone]

## 2022-05-22 NOTE — Evaluation (Signed)
Physical Therapy Evaluation Patient Details Name: Jacqueline [name] MRN: [id number] DOB: 05-11-1946 Today's Date: 05/22/2022  History of Present Illness  Pt is a 76 yo female that presented to the ED due to progression of chronic debility and difficulty for family to care for, as well as wound check. PMH of HTN, DMII,ESRD on HD TTS, Chronic debility , CAD s/p stent, Left knee arthritis, Chronic lower back pain,GERD,Obesity , HLD.   Clinical Impression  Patient alert, agreeable to PT, oriented x4. Some delay in answers but answers appropriately with extended time. Pt reported at baseline she is now living with her son and DIL, who due to recent deconditioning provide her with physical assistance for transfers, and that she is unable to get in/out of bed, and also needs help with lower body dressing. Family works during the day and is unable to provide physical assistance that the patient needs.  The patient was able to move all extremities against gravity.  Supine to sit with HOB elevated, bed rails, and minA. MaxA for socks/shoes to be donned. Sit <> Stand modA from standard height (minA from elevated) with RW. She ambulated ~2ft with RW and CGA. No LOB noted, but decreased gait velocity and step length/height noted. Returned to supine with modA for BLE assist.  Overall the patient demonstrated deficits (see "PT Problem List") that impede the patient's functional abilities, safety, and mobility and would benefit from skilled PT intervention. Recommendation is SNF due to decline from PLOF and current level of assistance needed.     Recommendations for follow up therapy are one component of a multi-disciplinary discharge planning process, led by the attending physician.  Recommendations may be updated based on patient status, additional functional criteria and insurance authorization.  Follow Up Recommendations Skilled nursing-short term rehab (<3 hours/day) Can patient physically be transported by  private vehicle: Yes    Assistance Recommended at Discharge Frequent or constant Supervision/Assistance  Patient can return home with the following  A little help with walking and/or transfers;A little help with bathing/dressing/bathroom;Assistance with cooking/housework;Assist for transportation;Help with stairs or ramp for entrance    Equipment Recommendations    Recommendations for Other Services       Functional Status Assessment Patient has had a recent decline in their functional status and demonstrates the ability to make significant improvements in function in a reasonable and predictable amount of time.     Precautions / Restrictions Precautions Precautions: Fall Restrictions Weight Bearing Restrictions: No      Mobility  Bed Mobility Overal bed mobility: Needs Assistance Bed Mobility: Supine to Sit, Sit to Supine     Supine to sit: Min assist, HOB elevated Sit to supine: Mod assist   General bed mobility comments: modA to return to supine for BLE assist    Transfers Overall transfer level: Needs assistance Equipment used: Rolling walker (2 wheels) Transfers: Sit to/from Stand Sit to Stand: Mod assist           General transfer comment: from elevated surface, minA    Ambulation/Gait Ambulation/Gait assistance: Min guard Gait Distance (Feet): 30 Feet Assistive device: Rolling walker (2 wheels)         General Gait Details: decreased velocity, decreased step length/height  Stairs            Wheelchair Mobility    Modified Rankin (Stroke Patients Only)       Balance Overall balance assessment: Needs assistance Sitting-balance support: Feet supported Sitting balance-Leahy Scale: Fair     Standing balance  support: Reliant on assistive device for balance Standing balance-Leahy Scale: Good Standing balance comment: reliant on BUE for dynamic tasks/ambulation                             Pertinent Vitals/Pain Pain  Assessment Pain Assessment: 0-10 Pain Score: 5  Pain Location: buttocks Pain Descriptors / Indicators: Grimacing, Throbbing, Aching Pain Intervention(s): Limited activity within patient's tolerance, Monitored during session, Repositioned    Home Living Family/patient expects to be discharged to:: Private residence Living Arrangements: Children Available Help at Discharge: Family;Available PRN/intermittently Type of Home: House Home Access: Stairs to enter   Entrance Stairs-Number of Steps: 4   Home Layout: One level Home Equipment: Advice worker (2 wheels);Wheelchair - manual Additional Comments: Goes to HD Tues,Th, Sat    Prior Function Prior Level of Function : Needs assist             Mobility Comments: Uses RW for ambulation, requires assist to come into standing ADLs Comments: Requires assist w/ all ADLs and IADLs     Hand Dominance        Extremity/Trunk Assessment   Upper Extremity Assessment Upper Extremity Assessment: Defer to OT evaluation RUE Deficits / Details: shoulder flexion limited to ~ 100 degrees LUE Deficits / Details: shoulder flexion limited to ~ 140 degrees    Lower Extremity Assessment Lower Extremity Assessment: Generalized weakness       Communication   Communication: No difficulties  Cognition Arousal/Alertness: Awake/alert Behavior During Therapy: Flat affect, WFL for tasks assessed/performed Overall Cognitive Status: Within Functional Limits for tasks assessed                                 General Comments: A&Ox4, pleasant, delayed speech        General Comments      Exercises     Assessment/Plan    PT Assessment Patient needs continued PT services  PT Problem List Decreased strength;Decreased mobility;Decreased activity tolerance;Decreased balance;Decreased knowledge of use of DME;Decreased knowledge of precautions       PT Treatment Interventions DME instruction;Therapeutic  exercise;Balance training;Neuromuscular re-education;Stair training;Functional mobility training;Therapeutic activities;Patient/family education    PT Goals (Current goals can be found in the Care Plan section)  Acute Rehab PT Goals Patient Stated Goal: to get her strength better PT Goal Formulation: With patient Time For Goal Achievement: 06/05/22 Potential to Achieve Goals: Good    Frequency Min 2X/week     Co-evaluation               AM-PAC PT "6 Clicks" Mobility  Outcome Measure Help needed turning from your back to your side while in a flat bed without using bedrails?: A Lot Help needed moving from lying on your back to sitting on the side of a flat bed without using bedrails?: A Lot Help needed moving to and from a bed to a chair (including a wheelchair)?: A Little Help needed standing up from a chair using your arms (e.g., wheelchair or bedside chair)?: A Little Help needed to walk in hospital room?: A Little Help needed climbing 3-5 steps with a railing? : A Lot 6 Click Score: 15    End of Session Equipment Utilized During Treatment: Gait belt Activity Tolerance: Patient tolerated treatment well Patient left: in bed;with call bell/phone within reach;with bed alarm set Nurse Communication: Mobility status PT Visit Diagnosis: Other abnormalities of gait  and mobility (R26.89);Difficulty in walking, not elsewhere classified (R26.2);Muscle weakness (generalized) (M62.81)    Time: 1694-5038 PT Time Calculation (min) (ACUTE ONLY): 20 min   Charges:   PT Evaluation $PT Eval Low Complexity: 1 Low PT Treatments $Therapeutic Activity: 8-22 mins        Lieutenant Diego PT, DPT 4:13 PM,05/22/22

## 2022-05-22 NOTE — Telephone Encounter (Signed)
Referral sent to [facility] Due to pt health decline family unable to bring to appointments.

## 2022-05-22 NOTE — Progress Notes (Addendum)
Progress Note    Jacqueline [name]  HGD:[id number] DOB: Feb 17, 1946  DOA: 05/21/2022 PCP: [name], FNP      Brief Narrative:    Medical records reviewed and are as summarized below:  Jacqueline [name] is a 76 y.o. female  with medical history significant of  HTN, DMII,ESRD on HD TTS, Chronic debility , CAD s/p stent, Left knee arthritis, Chronic lower back pain, GERD, Obesity, hyperlipidemia, who presented to the hospital because of generalized weakness and for wounds on her buttocks.        Assessment/Plan:   Principal Problem:   Weakness Active Problems:   ESRD (end stage renal disease) on dialysis (HCC)   Pressure injury of skin    Body mass index is 38.98 kg/m.  (Obesity)   Progressive debility/generalized weakness with family requesting placement to SNF: PT evaluation is pending.  OT recommended discharge to SNF.  Stage III coccygeal decubitus ulcer, moisture-associated wounds on bilateral buttocks: Continue local wound care.  Appreciate input from wound care nurse.  Hypotension: Continue midodrine  ESRD on TTS schedule: Consulted Dr. Candiss Norse, nephrologist, for hemodialysis.  Leukocytosis: Improved  Mild elevated troponins: Troponins are flat.  She has chronically elevated troponins likely from ESRD.  Comorbidities: CAD s/p coronary stent, left knee arthritis, GERD, type II DM, history of bradycardia   Diet Order             Diet renal with fluid restriction Fluid restriction: 1200 mL Fluid; Room service appropriate? Yes; Fluid consistency: Thin  Diet effective now                            Consultants: None  Procedures: None    Medications:    aspirin EC  81 mg Oral Daily   cinacalcet  30 mg Oral Q breakfast   ezetimibe  10 mg Oral Daily   ferric citrate  420 mg Oral BID WC   heparin  5,000 Units Subcutaneous Q8H   [START ON 05/23/2022] midodrine  10 mg Oral Q T,Th,Sa-HD   pantoprazole  40 mg Oral Daily    rosuvastatin  40 mg Oral Daily   Continuous Infusions:   Anti-infectives (From admission, onward)    None              Family Communication/Anticipated D/C date and plan/Code Status   DVT prophylaxis: heparin injection 5,000 Units Start: 05/22/22 0100     Code Status: Full Code  Family Communication: None Disposition Plan: Plan to discharge to SNF   Status is: Inpatient Remains inpatient appropriate because: Awaiting placement to SNF       Subjective:   She complains of burning sensation in the feet and generalized weakness.  She says she is unable to get out of bed but she is usually able to walk once she gets on her feet.     Objective:    Vitals:   05/21/22 2249 05/22/22 0041 05/22/22 0120 05/22/22 0740  BP: 110/70 (!) 106/35 110/63 (!) 94/44  Pulse: 83 67 66 (!) 57  Resp: 19 19 20 20   Temp:  98.2 F (36.8 C) 98.6 F (37 C) 98 F (36.7 C)  TempSrc:  Oral  Oral  SpO2: 98% 100% 100% 100%  Weight:      Height:       No data found.   Intake/Output Summary (Last 24 hours) at 05/22/2022 1453 Last data filed at 05/22/2022 1005 Gross per 24  hour  Intake 140 ml  Output --  Net 140 ml   Filed Weights   05/21/22 1926  Weight: 103 kg    Exam:  GEN: NAD SKIN: Warm and dry EYES: EOMI ENT: MMM CV: RRR PULM: CTA B ABD: soft, obese, NT, +BS CNS: AAO x 3, non focal EXT: No edema or tenderness    Data Reviewed:   I have personally reviewed following labs and imaging studies:  Labs: Labs show the following:   Basic Metabolic Panel: Recent Labs  Lab 05/21/22 1931 05/22/22 0446  NA 138  --   K 4.2  --   CL 96*  --   CO2 28  --   GLUCOSE 122*  --   BUN 16  --   CREATININE 3.98* 5.04*  CALCIUM 9.4  --    GFR Estimated Creatinine Clearance: 10.7 mL/min (A) (by C-G formula based on SCr of 5.04 mg/dL (H)). Liver Function Tests: Recent Labs  Lab 05/21/22 1931  AST 36  ALT 20  ALKPHOS 84  BILITOT 1.0  PROT 8.1  ALBUMIN 3.5    No results for input(s): "LIPASE", "AMYLASE" in the last 168 hours. No results for input(s): "AMMONIA" in the last 168 hours. Coagulation profile No results for input(s): "INR", "PROTIME" in the last 168 hours.  CBC: Recent Labs  Lab 05/21/22 1931 05/22/22 0446  WBC 12.3* 9.9  NEUTROABS 9.6*  --   HGB 12.6 11.5*  HCT 41.0 36.9  MCV 96.7 94.9  PLT 205 194   Cardiac Enzymes: No results for input(s): "CKTOTAL", "CKMB", "CKMBINDEX", "TROPONINI" in the last 168 hours. BNP (last 3 results) No results for input(s): "PROBNP" in the last 8760 hours. CBG: Recent Labs  Lab 05/22/22 0734 05/22/22 1204  GLUCAP 75 99   D-Dimer: No results for input(s): "DDIMER" in the last 72 hours. Hgb A1c: Recent Labs    05/22/22 0446  HGBA1C 5.9*   Lipid Profile: No results for input(s): "CHOL", "HDL", "LDLCALC", "TRIG", "CHOLHDL", "LDLDIRECT" in the last 72 hours. Thyroid function studies: Recent Labs    05/21/22 2210  TSH 2.440   Anemia work up: No results for input(s): "VITAMINB12", "FOLATE", "FERRITIN", "TIBC", "IRON", "RETICCTPCT" in the last 72 hours. Sepsis Labs: Recent Labs  Lab 05/21/22 1931 05/22/22 0446  PROCALCITON 0.59  --   WBC 12.3* 9.9    Microbiology No results found for this or any previous visit (from the past 240 hour(s)).  Procedures and diagnostic studies:  CT Angio Chest PE W and/or Wo Contrast  Result Date: 05/21/2022 CLINICAL DATA:  Weakness EXAM: CT ANGIOGRAPHY CHEST CT ABDOMEN AND PELVIS WITH CONTRAST TECHNIQUE: Multidetector CT imaging of the chest was performed using the standard protocol during bolus administration of intravenous contrast. Multiplanar CT image reconstructions and MIPs were obtained to evaluate the vascular anatomy. Multidetector CT imaging of the abdomen and pelvis was performed using the standard protocol during bolus administration of intravenous contrast. RADIATION DOSE REDUCTION: This exam was performed according to the  departmental dose-optimization program which includes automated exposure control, adjustment of the mA and/or kV according to patient size and/or use of iterative reconstruction technique. CONTRAST:  172mL OMNIPAQUE IOHEXOL 350 MG/ML SOLN COMPARISON:  02/12/2022, FINDINGS: CTA CHEST FINDINGS Cardiovascular: Atherosclerotic calcifications of the thoracic aorta are noted. No aneurysmal dilatation or definitive dissection is seen. Coronary calcifications are noted. Mild cardiomegaly is seen. The pulmonary artery shows a normal branching pattern. No filling defect to suggest pulmonary embolism is noted. Mediastinum/Nodes: Thoracic inlet is within  normal limits. No hilar or mediastinal adenopathy is noted. The esophagus as visualized is within normal limits. Lungs/Pleura: Mild dependent atelectatic changes are noted in the lungs bilaterally. No focal infiltrate or sizable effusion is seen. No parenchymal nodules are noted. Musculoskeletal: Degenerative changes of the thoracic spine are seen. No acute rib abnormality is noted. No compression deformity is seen. Review of the MIP images confirms the above findings. CT ABDOMEN and PELVIS FINDINGS Hepatobiliary: Mild fatty infiltration of the liver is noted. Gallbladder is within normal limits. Pancreas: Unremarkable. No pancreatic ductal dilatation or surrounding inflammatory changes. Spleen: Normal in size without focal abnormality. Adrenals/Urinary Tract: Adrenal glands are unremarkable. Kidneys show a normal enhancement pattern. Mild atrophic changes are noted. Scattered cysts are seen which appear simple in nature. The largest of these measures up to 5.6 cm in the left kidney. These are stable in appearance from prior exam. Bladder is decompressed. Stomach/Bowel: The appendix is within normal limits. No obstructive or inflammatory changes of the colon are seen. Small bowel and stomach show evidence of a small hiatal hernia. Vascular/Lymphatic: Aortic atherosclerosis. No  enlarged abdominal or pelvic lymph nodes. Reproductive: Status [name] hysterectomy. No adnexal masses. Other: No abdominal wall hernia or abnormality. No abdominopelvic ascites. Musculoskeletal: No acute bony abnormality is noted. Degenerative changes of the lumbar spine are seen. There is clinical history of pressure sores in the buttocks bilaterally although no definitive lesion is noted. Review of the MIP images confirms the above findings. IMPRESSION: CTA of the chest: No evidence of pulmonary embolism. Dependent atelectatic changes bilaterally. CT of the abdomen and pelvis: Fatty infiltration of the liver. Mild renal atrophy with stable appearing simple cysts. No follow-up is recommended. No other focal abnormality is seen. Aortic Atherosclerosis (ICD10-I70.0). Electronically Signed   By: Inez Catalina M.D.   On: 05/21/2022 21:36   CT ABDOMEN PELVIS W CONTRAST  Result Date: 05/21/2022 CLINICAL DATA:  Weakness EXAM: CT ANGIOGRAPHY CHEST CT ABDOMEN AND PELVIS WITH CONTRAST TECHNIQUE: Multidetector CT imaging of the chest was performed using the standard protocol during bolus administration of intravenous contrast. Multiplanar CT image reconstructions and MIPs were obtained to evaluate the vascular anatomy. Multidetector CT imaging of the abdomen and pelvis was performed using the standard protocol during bolus administration of intravenous contrast. RADIATION DOSE REDUCTION: This exam was performed according to the departmental dose-optimization program which includes automated exposure control, adjustment of the mA and/or kV according to patient size and/or use of iterative reconstruction technique. CONTRAST:  176mL OMNIPAQUE IOHEXOL 350 MG/ML SOLN COMPARISON:  02/12/2022, FINDINGS: CTA CHEST FINDINGS Cardiovascular: Atherosclerotic calcifications of the thoracic aorta are noted. No aneurysmal dilatation or definitive dissection is seen. Coronary calcifications are noted. Mild cardiomegaly is seen. The  pulmonary artery shows a normal branching pattern. No filling defect to suggest pulmonary embolism is noted. Mediastinum/Nodes: Thoracic inlet is within normal limits. No hilar or mediastinal adenopathy is noted. The esophagus as visualized is within normal limits. Lungs/Pleura: Mild dependent atelectatic changes are noted in the lungs bilaterally. No focal infiltrate or sizable effusion is seen. No parenchymal nodules are noted. Musculoskeletal: Degenerative changes of the thoracic spine are seen. No acute rib abnormality is noted. No compression deformity is seen. Review of the MIP images confirms the above findings. CT ABDOMEN and PELVIS FINDINGS Hepatobiliary: Mild fatty infiltration of the liver is noted. Gallbladder is within normal limits. Pancreas: Unremarkable. No pancreatic ductal dilatation or surrounding inflammatory changes. Spleen: Normal in size without focal abnormality. Adrenals/Urinary Tract: Adrenal glands are unremarkable. Kidneys  show a normal enhancement pattern. Mild atrophic changes are noted. Scattered cysts are seen which appear simple in nature. The largest of these measures up to 5.6 cm in the left kidney. These are stable in appearance from prior exam. Bladder is decompressed. Stomach/Bowel: The appendix is within normal limits. No obstructive or inflammatory changes of the colon are seen. Small bowel and stomach show evidence of a small hiatal hernia. Vascular/Lymphatic: Aortic atherosclerosis. No enlarged abdominal or pelvic lymph nodes. Reproductive: Status [name] hysterectomy. No adnexal masses. Other: No abdominal wall hernia or abnormality. No abdominopelvic ascites. Musculoskeletal: No acute bony abnormality is noted. Degenerative changes of the lumbar spine are seen. There is clinical history of pressure sores in the buttocks bilaterally although no definitive lesion is noted. Review of the MIP images confirms the above findings. IMPRESSION: CTA of the chest: No evidence of  pulmonary embolism. Dependent atelectatic changes bilaterally. CT of the abdomen and pelvis: Fatty infiltration of the liver. Mild renal atrophy with stable appearing simple cysts. No follow-up is recommended. No other focal abnormality is seen. Aortic Atherosclerosis (ICD10-I70.0). Electronically Signed   By: Inez Catalina M.D.   On: 05/21/2022 21:36               LOS: 1 day   Tailyn Hantz  [facility]   Pager on [email or web address]. If 7PM-7AM, please contact night-coverage at [email or web address]     05/22/2022, 2:53 PM

## 2022-05-22 NOTE — Evaluation (Signed)
Occupational Therapy Evaluation Patient Details Name: Jacqueline [name] MRN: [id number] DOB: 1946-09-17 Today's Date: 05/22/2022   History of Present Illness Jacqueline [name] is a 76 y.o. female with medical history significant of   HTN, DMII,ESRD on HD TTS, Chronic debility , CAD s/p stent, Left knee arthritis, Chronic lower back pain,GERD,Obesity , HLD, who presents to ED brought in by family due to progression of chronic debility and family inability to take care of patient in addition to wound check: she has developed pressure ulcer in the sacral area.   Clinical Impression   [name] presents with generalized weakness, limited endurance, impaired mobility and balance, and pain. Until last month, pt had been living with her husband, who helped get her up and moving each day, but he had a fall and died in 05/08/2023, and pt went to live with her son and daughter-in-law. They work 8 AM-6PM Mon-Fri; pt goes for HD T, Th, Sat. On Mondays, Wednesdays, and Fridays, pt is home alone all day and cannot get herself out of recliner. The bed at pt's son's home is too low for pt to get into, so pt sleeps in recliner each night. During today's evaluation, pt is Max A in bed mobility, Max A for LB dressing, Mod A for sit<stand transfers. Pt is able to ambulate with fair-good balance in standing w/ RW. She requires Max A for LB dressing, Mod A for UB dressing, and Min A for self-feeding, with limited ROM of b/l UE and b/l LE. Recommend DC to SNF. Pt would benefit from both a lift chair and hospital bed at her home.    Recommendations for follow up therapy are one component of a multi-disciplinary discharge planning process, led by the attending physician.  Recommendations may be updated based on patient status, additional functional criteria and insurance authorization.   Follow Up Recommendations  Skilled nursing-short term rehab (<3 hours/day)    Assistance Recommended at Discharge Frequent or constant  Supervision/Assistance  Patient can return home with the following A lot of help with walking and/or transfers;A lot of help with bathing/dressing/bathroom;Help with stairs or ramp for entrance;Assistance with cooking/housework    Functional Status Assessment  Patient has had a recent decline in their functional status and demonstrates the ability to make significant improvements in function in a reasonable and predictable amount of time.  Equipment Recommendations  Hospital bed    Recommendations for Other Services       Precautions / Restrictions Precautions Precautions: Fall Restrictions Weight Bearing Restrictions: No      Mobility Bed Mobility Overal bed mobility: Needs Assistance Bed Mobility: Supine to Sit, Sit to Supine     Supine to sit: Max assist Sit to supine: Max assist        Transfers Overall transfer level: Needs assistance Equipment used: Rolling walker (2 wheels) Transfers: Sit to/from Stand Sit to Stand: Mod assist                  Balance Overall balance assessment: Needs assistance Sitting-balance support: Feet supported, Single extremity supported Sitting balance-Leahy Scale: Fair   Postural control: Right lateral lean Standing balance support: Bilateral upper extremity supported Standing balance-Leahy Scale: Good                             ADL either performed or assessed with clinical judgement   ADL Overall ADL's : Needs assistance/impaired  Upper Body Dressing : Moderate assistance   Lower Body Dressing: Maximal assistance                       Vision         Perception     Praxis      Pertinent Vitals/Pain Pain Assessment Pain Assessment: 0-10 Pain Score: 5  Pain Location: buttocks Pain Descriptors / Indicators: Grimacing, Throbbing Pain Intervention(s): Limited activity within patient's tolerance, Repositioned     Hand Dominance     Extremity/Trunk Assessment  Upper Extremity Assessment Upper Extremity Assessment: Generalized weakness;RUE deficits/detail;LUE deficits/detail RUE Deficits / Details: shoulder flexion limited to ~ 100 degrees LUE Deficits / Details: shoulder flexion limited to ~ 140 degrees   Lower Extremity Assessment Lower Extremity Assessment: Generalized weakness       Communication Communication Communication: No difficulties   Cognition Arousal/Alertness: Awake/alert Behavior During Therapy: Flat affect, WFL for tasks assessed/performed Overall Cognitive Status: Within Functional Limits for tasks assessed                                 General Comments: A&Ox4, pleasant, delayed speech     General Comments       Exercises Other Exercises Other Exercises: Educ re: repositioning, importance of OOB movement, PoC, DC recs   Shoulder Instructions      Home Living Family/patient expects to be discharged to:: Private residence Living Arrangements: Children Available Help at Discharge: Family;Available PRN/intermittently Type of Home: House Home Access: Stairs to enter [employer] of Steps: 4   Home Layout: One level     Bathroom Shower/Tub: Teacher, early years/pre: Standard     Home Equipment: Advice worker (2 wheels);Wheelchair - manual   Additional Comments: Goes to HD Tues,Th, Sat      Prior Functioning/Environment Prior Level of Function : Needs assist             Mobility Comments: Uses RW for ambulation, requires assist to come into standing ADLs Comments: Requires assist w/ all ADLs and IADLs        OT Problem List: Impaired balance (sitting and/or standing);Pain;Decreased activity tolerance;Decreased range of motion;Decreased strength      OT Treatment/Interventions: Self-care/ADL training;Patient/family education;Therapeutic exercise;Balance training;Therapeutic activities    OT Goals(Current goals can be found in the care plan section)  Acute Rehab OT Goals Patient Stated Goal: to be able to sleep in bed OT Goal Formulation: With patient Time For Goal Achievement: 06/05/22 Potential to Achieve Goals: Good ADL Goals Pt Will Transfer to Toilet: with mod assist;regular height toilet;stand pivot transfer (using LRAD) Pt Will Perform Tub/Shower Transfer: with mod assist;shower seat Pt/caregiver will Perform Home Exercise Program: Increased ROM;Increased strength (performed in sitting and standing as able)  OT Frequency: Min 2X/week    Co-evaluation              AM-PAC OT "6 Clicks" Daily Activity     Outcome Measure Help from another person eating meals?: A Little Help from another person taking care of personal grooming?: A Little Help from another person toileting, which includes using toliet, bedpan, or urinal?: A Lot Help from another person bathing (including washing, rinsing, drying)?: A Lot Help from another person to put on and taking off regular upper body clothing?: A Little Help from another person to put on and taking off regular lower body clothing?: A Lot 6 Click Score: 15  End of Session Equipment Utilized During Treatment: Rolling walker (2 wheels)  Activity Tolerance: Patient tolerated treatment well Patient left: in bed;with call bell/phone within reach;with bed alarm set  OT Visit Diagnosis: Muscle weakness (generalized) (M62.81);Other abnormalities of gait and mobility (R26.89);Pain                Time: 4196-2229 OT Time Calculation (min): 36 min Charges:  OT General Charges $OT Visit: 1 Visit OT Evaluation $OT Eval Moderate Complexity: 1 Mod OT Treatments $Self Care/Home Management : 23-37 mins Josiah Lobo, PhD, MS, OTR/L 05/22/22, 12:46 PM

## 2022-05-23 DIAGNOSIS — E1122 Type 2 diabetes mellitus with diabetic chronic kidney disease: Secondary | ICD-10-CM | POA: Diagnosis not present

## 2022-05-23 DIAGNOSIS — I132 Hypertensive heart and chronic kidney disease with heart failure and with stage 5 chronic kidney disease, or end stage renal disease: Secondary | ICD-10-CM | POA: Diagnosis not present

## 2022-05-23 DIAGNOSIS — N2581 Secondary hyperparathyroidism of renal origin: Secondary | ICD-10-CM | POA: Diagnosis not present

## 2022-05-23 DIAGNOSIS — I509 Heart failure, unspecified: Secondary | ICD-10-CM | POA: Diagnosis not present

## 2022-05-23 DIAGNOSIS — D631 Anemia in chronic kidney disease: Secondary | ICD-10-CM | POA: Diagnosis not present

## 2022-05-23 DIAGNOSIS — D72829 Elevated white blood cell count, unspecified: Secondary | ICD-10-CM | POA: Diagnosis not present

## 2022-05-23 DIAGNOSIS — L89153 Pressure ulcer of sacral region, stage 3: Secondary | ICD-10-CM | POA: Diagnosis not present

## 2022-05-23 DIAGNOSIS — R531 Weakness: Secondary | ICD-10-CM | POA: Diagnosis not present

## 2022-05-23 DIAGNOSIS — K5900 Constipation, unspecified: Secondary | ICD-10-CM | POA: Diagnosis not present

## 2022-05-23 DIAGNOSIS — I959 Hypotension, unspecified: Secondary | ICD-10-CM | POA: Diagnosis not present

## 2022-05-23 DIAGNOSIS — Z992 Dependence on renal dialysis: Secondary | ICD-10-CM | POA: Diagnosis not present

## 2022-05-23 DIAGNOSIS — K219 Gastro-esophageal reflux disease without esophagitis: Secondary | ICD-10-CM | POA: Diagnosis not present

## 2022-05-23 DIAGNOSIS — N186 End stage renal disease: Secondary | ICD-10-CM | POA: Diagnosis not present

## 2022-05-23 LAB — GLUCOSE, CAPILLARY
Glucose-Capillary: 88 mg/dL (ref 70–99)
Glucose-Capillary: 138 mg/dL — ABNORMAL HIGH (ref 70–99)
Glucose-Capillary: 169 mg/dL — ABNORMAL HIGH (ref 70–99)

## 2022-05-23 LAB — HEPATITIS B SURFACE ANTIBODY,QUALITATIVE: Hep B S Ab: REACTIVE — AB

## 2022-05-23 LAB — HEPATITIS C ANTIBODY: HCV Ab: NONREACTIVE

## 2022-05-23 LAB — HEPATITIS B CORE ANTIBODY, TOTAL: Hep B Core Total Ab: NONREACTIVE

## 2022-05-23 LAB — HEPATITIS B SURFACE ANTIGEN: Hepatitis B Surface Ag: NONREACTIVE

## 2022-05-23 MED ORDER — HEPARIN SODIUM (PORCINE) 1000 UNIT/ML DIALYSIS: 1000.0000 [IU] | INTRAMUSCULAR | Status: DC | PRN

## 2022-05-23 MED ORDER — CHLORHEXIDINE GLUCONATE CLOTH 2 % EX PADS
6.0000 | MEDICATED_PAD | Freq: Every day | CUTANEOUS | Status: DC
  Administered 2022-05-23 – 2022-05-30 (×8): 6 via TOPICAL

## 2022-05-23 MED ORDER — ACETAMINOPHEN 500 MG PO TABS
ORAL_TABLET | ORAL | Status: AC
  Filled 2022-05-23: qty 2

## 2022-05-23 MED ORDER — ALTEPLASE 2 MG IJ SOLR: 2.0000 mg | Freq: Once | INTRAMUSCULAR | Status: DC | PRN

## 2022-05-23 MED ORDER — PENTAFLUOROPROP-TETRAFLUOROETH EX AERO: 1.0000 | INHALATION_SPRAY | CUTANEOUS | Status: DC | PRN

## 2022-05-23 MED ORDER — LIDOCAINE HCL (PF) 1 % IJ SOLN: 5.0000 mL | INTRAMUSCULAR | Status: DC | PRN

## 2022-05-23 MED ORDER — LIDOCAINE-PRILOCAINE 2.5-2.5 % EX CREA: 1.0000 | TOPICAL_CREAM | CUTANEOUS | Status: DC | PRN

## 2022-05-23 NOTE — Progress Notes (Signed)
PT Cancellation Note  Patient Details Name: Jacqueline [name] MRN: [id number] DOB: [birth year]   Cancelled Treatment:    Reason Eval/Treat Not Completed: Patient at procedure or test/unavailable. Patient still at hemodialysis. PT will continue with attempts.   [name], PT, MPT  [name] 05/23/2022, 1:18 PM

## 2022-05-23 NOTE — Progress Notes (Signed)
Progress Note    Jacqueline [name]  PIR:[id number] DOB: [birth year]  DOA: 05/21/2022 PCP: [name], FNP      Brief Narrative:    Medical records reviewed and are as summarized below:  Jacqueline [name] is a 76 y.o. female  with medical history significant of  HTN, DMII,ESRD on HD TTS, Chronic debility , CAD s/p stent, Left knee arthritis, Chronic lower back pain, GERD, Obesity, hyperlipidemia, who presented to the hospital because of generalized weakness and for wounds on her buttocks.        Assessment/Plan:   Principal Problem:   Weakness Active Problems:   ESRD (end stage renal disease) on dialysis (HCC)   Pressure injury of skin   General weakness    Body mass index is 38.37 kg/m.  (Obesity)   Progressive debility/generalized weakness with family requesting placement to SNF: PT and OT recommended discharge to SNF.  Awaiting placement.  Follow-up with social worker to assist with disposition.  Stage III coccygeal decubitus ulcer, moisture-associated wounds on bilateral buttocks: Continue local wound care.  Appreciate input from wound care nurse.  Hypotension: Continue midodrine  ESRD on TTS schedule: Consulted Dr. Candiss Norse, nephrologist, for hemodialysis.  Leukocytosis: Improved  Mild elevated troponins: Troponins are flat.  She has chronically elevated troponins likely from ESRD.  Comorbidities: CAD s/p coronary stent, left knee arthritis, GERD, type II DM, history of bradycardia   Diet Order             Diet renal with fluid restriction Fluid restriction: 1200 mL Fluid; Room service appropriate? Yes; Fluid consistency: Thin  Diet effective now                            Consultants: None  Procedures: None    Medications:    aspirin EC  81 mg Oral Daily   Chlorhexidine Gluconate Cloth  6 each Topical Q0600   cinacalcet  30 mg Oral Q breakfast   ezetimibe  10 mg Oral Daily   ferric citrate  420 mg Oral BID WC   heparin   5,000 Units Subcutaneous Q8H   midodrine  10 mg Oral Q T,Th,Sa-HD   pantoprazole  40 mg Oral Daily   rosuvastatin  40 mg Oral Daily   Continuous Infusions:   Anti-infectives (From admission, onward)    None              Family Communication/Anticipated D/C date and plan/Code Status   DVT prophylaxis: heparin injection 5,000 Units Start: 05/22/22 0100     Code Status: Full Code  Family Communication: None Disposition Plan: Plan to discharge to SNF   Status is: Inpatient Remains inpatient appropriate because: Awaiting placement to SNF       Subjective:   Interval events noted.  She just came back from hemodialysis.  She complains of burning sensation in bilateral feet.  She said she had taken gabapentin in the past but Dr. Posey Pronto, her nephrologist, had taken her off gabapentin because he said it causes "more harm".    Objective:    Vitals:   05/23/22 1330 05/23/22 1345 05/23/22 1400 05/23/22 1542  BP: (!) 80/36 (!) 84/35 (!) 85/38 (!) 100/41  Pulse: 64 63 (!) 59 69  Resp: 20 (!) 24 (!) 21 18  Temp:    98.1 F (36.7 C)  TempSrc:    Oral  SpO2: 95% 96% 95% 95%  Weight:      Height:  No data found.   Intake/Output Summary (Last 24 hours) at 05/23/2022 1651 Last data filed at 05/23/2022 1303 Gross per 24 hour  Intake --  Output 579 ml  Net -579 ml   Filed Weights   05/23/22 0500 05/23/22 0934 05/23/22 1303  Weight: 104.4 kg 101.8 kg 101.4 kg    Exam:  GEN: NAD SKIN: Stage III coccygeal decubitus ulcer, moisture associated wounds on the buttocks EYES: EOMI ENT: MMM CV: RRR PULM: CTA B ABD: soft, obese, NT, +BS CNS: AAO x 3, non focal EXT: No edema or tenderness      Data Reviewed:   I have personally reviewed following labs and imaging studies:  Labs: Labs show the following:   Basic Metabolic Panel: Recent Labs  Lab 05/21/22 1931 05/22/22 0446  NA 138  --   K 4.2  --   CL 96*  --   CO2 28  --   GLUCOSE 122*  --    BUN 16  --   CREATININE 3.98* 5.04*  CALCIUM 9.4  --    GFR Estimated Creatinine Clearance: 10.7 mL/min (A) (by C-G formula based on SCr of 5.04 mg/dL (H)). Liver Function Tests: Recent Labs  Lab 05/21/22 1931  AST 36  ALT 20  ALKPHOS 84  BILITOT 1.0  PROT 8.1  ALBUMIN 3.5   No results for input(s): "LIPASE", "AMYLASE" in the last 168 hours. No results for input(s): "AMMONIA" in the last 168 hours. Coagulation profile No results for input(s): "INR", "PROTIME" in the last 168 hours.  CBC: Recent Labs  Lab 05/21/22 1931 05/22/22 0446  WBC 12.3* 9.9  NEUTROABS 9.6*  --   HGB 12.6 11.5*  HCT 41.0 36.9  MCV 96.7 94.9  PLT 205 194   Cardiac Enzymes: No results for input(s): "CKTOTAL", "CKMB", "CKMBINDEX", "TROPONINI" in the last 168 hours. BNP (last 3 results) No results for input(s): "PROBNP" in the last 8760 hours. CBG: Recent Labs  Lab 05/22/22 1204 05/22/22 1709 05/22/22 2135 05/23/22 0810 05/23/22 1611  GLUCAP 99 100* 136* 88 138*   D-Dimer: No results for input(s): "DDIMER" in the last 72 hours. Hgb A1c: Recent Labs    05/22/22 0446  HGBA1C 5.9*   Lipid Profile: No results for input(s): "CHOL", "HDL", "LDLCALC", "TRIG", "CHOLHDL", "LDLDIRECT" in the last 72 hours. Thyroid function studies: Recent Labs    05/21/22 2210  TSH 2.440   Anemia work up: No results for input(s): "VITAMINB12", "FOLATE", "FERRITIN", "TIBC", "IRON", "RETICCTPCT" in the last 72 hours. Sepsis Labs: Recent Labs  Lab 05/21/22 1931 05/22/22 0446  PROCALCITON 0.59  --   WBC 12.3* 9.9    Microbiology No results found for this or any previous visit (from the past 240 hour(s)).  Procedures and diagnostic studies:  CT Angio Chest PE W and/or Wo Contrast  Result Date: 05/21/2022 CLINICAL DATA:  Weakness EXAM: CT ANGIOGRAPHY CHEST CT ABDOMEN AND PELVIS WITH CONTRAST TECHNIQUE: Multidetector CT imaging of the chest was performed using the standard protocol during bolus  administration of intravenous contrast. Multiplanar CT image reconstructions and MIPs were obtained to evaluate the vascular anatomy. Multidetector CT imaging of the abdomen and pelvis was performed using the standard protocol during bolus administration of intravenous contrast. RADIATION DOSE REDUCTION: This exam was performed according to the departmental dose-optimization program which includes automated exposure control, adjustment of the mA and/or kV according to patient size and/or use of iterative reconstruction technique. CONTRAST:  19mL OMNIPAQUE IOHEXOL 350 MG/ML SOLN COMPARISON:  02/12/2022, FINDINGS: CTA  CHEST FINDINGS Cardiovascular: Atherosclerotic calcifications of the thoracic aorta are noted. No aneurysmal dilatation or definitive dissection is seen. Coronary calcifications are noted. Mild cardiomegaly is seen. The pulmonary artery shows a normal branching pattern. No filling defect to suggest pulmonary embolism is noted. Mediastinum/Nodes: Thoracic inlet is within normal limits. No hilar or mediastinal adenopathy is noted. The esophagus as visualized is within normal limits. Lungs/Pleura: Mild dependent atelectatic changes are noted in the lungs bilaterally. No focal infiltrate or sizable effusion is seen. No parenchymal nodules are noted. Musculoskeletal: Degenerative changes of the thoracic spine are seen. No acute rib abnormality is noted. No compression deformity is seen. Review of the MIP images confirms the above findings. CT ABDOMEN and PELVIS FINDINGS Hepatobiliary: Mild fatty infiltration of the liver is noted. Gallbladder is within normal limits. Pancreas: Unremarkable. No pancreatic ductal dilatation or surrounding inflammatory changes. Spleen: Normal in size without focal abnormality. Adrenals/Urinary Tract: Adrenal glands are unremarkable. Kidneys show a normal enhancement pattern. Mild atrophic changes are noted. Scattered cysts are seen which appear simple in nature. The largest of  these measures up to 5.6 cm in the left kidney. These are stable in appearance from prior exam. Bladder is decompressed. Stomach/Bowel: The appendix is within normal limits. No obstructive or inflammatory changes of the colon are seen. Small bowel and stomach show evidence of a small hiatal hernia. Vascular/Lymphatic: Aortic atherosclerosis. No enlarged abdominal or pelvic lymph nodes. Reproductive: Status [name] hysterectomy. No adnexal masses. Other: No abdominal wall hernia or abnormality. No abdominopelvic ascites. Musculoskeletal: No acute bony abnormality is noted. Degenerative changes of the lumbar spine are seen. There is clinical history of pressure sores in the buttocks bilaterally although no definitive lesion is noted. Review of the MIP images confirms the above findings. IMPRESSION: CTA of the chest: No evidence of pulmonary embolism. Dependent atelectatic changes bilaterally. CT of the abdomen and pelvis: Fatty infiltration of the liver. Mild renal atrophy with stable appearing simple cysts. No follow-up is recommended. No other focal abnormality is seen. Aortic Atherosclerosis (ICD10-I70.0). Electronically Signed   By: Inez Catalina M.D.   On: 05/21/2022 21:36   CT ABDOMEN PELVIS W CONTRAST  Result Date: 05/21/2022 CLINICAL DATA:  Weakness EXAM: CT ANGIOGRAPHY CHEST CT ABDOMEN AND PELVIS WITH CONTRAST TECHNIQUE: Multidetector CT imaging of the chest was performed using the standard protocol during bolus administration of intravenous contrast. Multiplanar CT image reconstructions and MIPs were obtained to evaluate the vascular anatomy. Multidetector CT imaging of the abdomen and pelvis was performed using the standard protocol during bolus administration of intravenous contrast. RADIATION DOSE REDUCTION: This exam was performed according to the departmental dose-optimization program which includes automated exposure control, adjustment of the mA and/or kV according to patient size and/or use of  iterative reconstruction technique. CONTRAST:  131mL OMNIPAQUE IOHEXOL 350 MG/ML SOLN COMPARISON:  02/12/2022, FINDINGS: CTA CHEST FINDINGS Cardiovascular: Atherosclerotic calcifications of the thoracic aorta are noted. No aneurysmal dilatation or definitive dissection is seen. Coronary calcifications are noted. Mild cardiomegaly is seen. The pulmonary artery shows a normal branching pattern. No filling defect to suggest pulmonary embolism is noted. Mediastinum/Nodes: Thoracic inlet is within normal limits. No hilar or mediastinal adenopathy is noted. The esophagus as visualized is within normal limits. Lungs/Pleura: Mild dependent atelectatic changes are noted in the lungs bilaterally. No focal infiltrate or sizable effusion is seen. No parenchymal nodules are noted. Musculoskeletal: Degenerative changes of the thoracic spine are seen. No acute rib abnormality is noted. No compression deformity is seen. Review of the  MIP images confirms the above findings. CT ABDOMEN and PELVIS FINDINGS Hepatobiliary: Mild fatty infiltration of the liver is noted. Gallbladder is within normal limits. Pancreas: Unremarkable. No pancreatic ductal dilatation or surrounding inflammatory changes. Spleen: Normal in size without focal abnormality. Adrenals/Urinary Tract: Adrenal glands are unremarkable. Kidneys show a normal enhancement pattern. Mild atrophic changes are noted. Scattered cysts are seen which appear simple in nature. The largest of these measures up to 5.6 cm in the left kidney. These are stable in appearance from prior exam. Bladder is decompressed. Stomach/Bowel: The appendix is within normal limits. No obstructive or inflammatory changes of the colon are seen. Small bowel and stomach show evidence of a small hiatal hernia. Vascular/Lymphatic: Aortic atherosclerosis. No enlarged abdominal or pelvic lymph nodes. Reproductive: Status [name] hysterectomy. No adnexal masses. Other: No abdominal wall hernia or abnormality. No  abdominopelvic ascites. Musculoskeletal: No acute bony abnormality is noted. Degenerative changes of the lumbar spine are seen. There is clinical history of pressure sores in the buttocks bilaterally although no definitive lesion is noted. Review of the MIP images confirms the above findings. IMPRESSION: CTA of the chest: No evidence of pulmonary embolism. Dependent atelectatic changes bilaterally. CT of the abdomen and pelvis: Fatty infiltration of the liver. Mild renal atrophy with stable appearing simple cysts. No follow-up is recommended. No other focal abnormality is seen. Aortic Atherosclerosis (ICD10-I70.0). Electronically Signed   By: Inez Catalina M.D.   On: 05/21/2022 21:36               LOS: 1 day   Tziporah Knoke  [facility]   Pager on [email or web address]. If 7PM-7AM, please contact night-coverage at [email or web address]     05/23/2022, 4:51 PM

## 2022-05-23 NOTE — NC FL2 (Addendum)
[place] LEVEL OF CARE SCREENING TOOL     IDENTIFICATION  Patient Name: Jacqueline [name] Birthdate: Aug 06, 1946 Sex: female Admission Date (Current Location): 05/21/2022  [facility] and [place] Number:  Engineering geologist and Address:  [facility], [address] [place], [place] [address]      Provider Number: [id number]  Attending Physician Name and Address:  Jennye Boroughs, MD  Relative Name and Phone Number:       Current Level of Care: Hospital Recommended Level of Care: [place] Prior Approval Number:    Date Approved/Denied:   PASRR Number: [id number]  Discharge Plan: SNF    Current Diagnoses: Patient Active Problem List   Diagnosis Date Noted   Pressure injury of skin 05/22/2022   General weakness 05/22/2022   Weakness 05/21/2022   Anaphylactic shock, unspecified, initial encounter 06/29/2020   Sepsis ([place]) 06/22/2019   Pain due to onychomycosis of toenails of both feet 06/11/2019   Callus 06/11/2019   Other disorders of phosphorus metabolism 05/20/2019   Mixed hyperlipidemia 03/22/2019   Otalgia of both ears 03/22/2019   Right arm pain 03/22/2019   Bradycardia 10/01/2017   Dyslipidemia 10/01/2017   Dependence on renal dialysis ([place]) 09/01/2017   Chest pain, unspecified 12/06/2016   Non-ST elevation (NSTEMI) myocardial infarction ([place]) 12/06/2016   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   History of NSTEMI    CAD S/P percutaneous coronary angioplasty 12/03/2016   Hypokalemia 05/03/2016   Pruritus, unspecified 03/27/2015   Encounter for removal of sutures 11/30/2014   Pain in lower limb 08/26/2014   Encounter for immunization 06/10/2014   Unspecified protein-calorie malnutrition ([place]) 02/28/2014   Allergy, unspecified, initial encounter 02/15/2014   HCAP (healthcare-associated pneumonia) 02/13/2014   Absence of both cervix and uterus, acquired 02/01/2014   Coagulation defect, unspecified ([place])  02/01/2014   Dyspnea, unspecified 02/01/2014   Family history of diabetes mellitus 02/01/2014   Family history of ischemic heart disease and other diseases of the circulatory system 02/01/2014   Fever, unspecified 02/01/2014   Headache, unspecified 02/01/2014   History of allergy to sulfonamides 02/01/2014   Iron deficiency anemia, unspecified 02/01/2014   Long term (current) use of insulin ([place]) 02/01/2014   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   OSA (obstructive sleep apnea) 01/21/2014   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[place]) 01/21/2014   Acute encephalopathy 01/19/2014   Congestive heart failure ([place]) 01/15/2014   ESRD (end stage renal disease) on dialysis ([place]) 01/15/2014   Hypertension 01/15/2014   Diabetes mellitus ([place]) 01/15/2014   Anemia 01/15/2014   Onychomycosis 03/26/2013   Pain in joint, ankle and foot 03/26/2013    Orientation RESPIRATION BLADDER Height & Weight     Self, Time, Situation, Place  O2 (1.5L) Continent Weight: 224 lb 6.9 oz (101.8 kg) Height:  5\' 4"  (162.6 cm)  BEHAVIORAL SYMPTOMS/MOOD NEUROLOGICAL BOWEL NUTRITION STATUS  Other (Comment) (N/A)  (N/A) Continent Diet  AMBULATORY STATUS COMMUNICATION OF NEEDS Skin     Verbally Other (Comment) (Pressure Injury;Buttocks Right;Medial Stage 2 -  Partial thickness loss of dermis presenting as a shallow open injury with a red, pink wound bed without slough.)                       Personal Care Assistance Level of Assistance              Functional Limitations Info  Sight, Hearing, Speech Sight Info: Adequate Hearing Info: Adequate Speech Info: Adequate    SPECIAL  CARE FACTORS FREQUENCY  PT (By licensed PT), OT (By licensed OT)     PT Frequency: 5X OT Frequency: 5X            Contractures Contractures Info: Not present    Additional Factors Info  Code Status, Allergies Code Status Info: FULL Allergies Info: Sulfa Antibiotics           Current Medications  (05/23/2022):  This is the current hospital active medication list Current Facility-Administered Medications  Medication Dose Route Frequency Provider Last Rate Last Admin   acetaminophen (TYLENOL) tablet 1,000 mg  1,000 mg Oral Q6H PRN Myles Rosenthal A, MD   1,000 mg at 05/22/22 2030   albuterol (PROVENTIL) (2.5 MG/3ML) 0.083% nebulizer solution 2.5 mg  2.5 mg Nebulization Q2H PRN Myles Rosenthal A, MD       alteplase (CATHFLO ACTIVASE) injection 2 mg  2 mg Intracatheter Once PRN Colon Flattery, NP       aspirin EC tablet 81 mg  81 mg Oral Daily Myles Rosenthal A, MD   81 mg at 05/22/22 0827   Chlorhexidine Gluconate Cloth 2 % PADS 6 each  6 each Topical Q0600 Colon Flattery, NP   6 each at 05/23/22 0839   cinacalcet (SENSIPAR) tablet 30 mg  30 mg Oral Q breakfast Myles Rosenthal A, MD   30 mg at 05/23/22 5409   ezetimibe (ZETIA) tablet 10 mg  10 mg Oral Daily Myles Rosenthal A, MD   10 mg at 05/22/22 0828   ferric citrate (AURYXIA) tablet 420 mg  420 mg Oral BID WC Myles Rosenthal A, MD   420 mg at 05/23/22 0824   heparin injection 1,000 Units  1,000 Units Dialysis PRN Colon Flattery, NP       heparin injection 5,000 Units  5,000 Units Subcutaneous Q8H Myles Rosenthal A, MD   5,000 Units at 05/22/22 2030   lidocaine (PF) (XYLOCAINE) 1 % injection 5 mL  5 mL Intradermal PRN Colon Flattery, NP       lidocaine-prilocaine (EMLA) cream 1 Application  1 Application Topical PRN Colon Flattery, NP       midodrine (PROAMATINE) tablet 10 mg  10 mg Oral Q T,Th,Sa-HD Myles Rosenthal A, MD   10 mg at 05/23/22 0824   ondansetron (ZOFRAN) tablet 4 mg  4 mg Oral Q6H PRN Clance Boll, MD       Or   ondansetron Scnetx) injection 4 mg  4 mg Intravenous Q6H PRN Myles Rosenthal A, MD   4 mg at 05/23/22 0851   pantoprazole (PROTONIX) EC tablet 40 mg  40 mg Oral Daily Myles Rosenthal A, MD   40 mg at 05/22/22 0827   pentafluoroprop-tetrafluoroeth (GEBAUERS) aerosol 1  Application  1 Application Topical PRN Colon Flattery, NP       rosuvastatin (CRESTOR) tablet 40 mg  40 mg Oral Daily Clance Boll, MD   40 mg at 05/22/22 0827     Discharge Medications: Please see discharge summary for a list of discharge medications.  Relevant Imaging Results:  Relevant Lab Results:   Additional Information    Donnelly Angelica, LCSW

## 2022-05-23 NOTE — TOC Initial Note (Signed)
Transition of Care [facility]) - Initial/Assessment Note    Patient Details  Name: Jacqueline [name] MRN: [id number] Date of Birth: 1946-01-14  Transition of Care [facility]) CM/SW Contact:    Donnelly Angelica, LCSW Phone Number: 05/23/2022, 11:43 AM  Clinical Narrative:  PT & OT recommended a [place]. PASSR received: [id number]. CSW started bed search for pt.                   Expected Discharge Plan: Skilled Nursing Facility Barriers to Discharge: Continued Medical Work up   Patient Goals and CMS Choice        Expected Discharge Plan and Services Expected Discharge Plan: [place]                                              Prior Living Arrangements/Services     Patient language and need for interpreter reviewed:: Yes Do you feel safe going back to the place where you live?: Yes      Need for Family Participation in Patient Care: Yes (Comment) Care giver support system in place?: Yes (comment)   Criminal Activity/Legal Involvement Pertinent to Current Situation/Hospitalization: No - Comment as needed  Activities of Daily Living Home Assistive Devices/Equipment: None ADL Screening (condition at time of admission) Patient's cognitive ability adequate to safely complete daily activities?: Yes Is the patient deaf or have difficulty hearing?: No Does the patient have difficulty seeing, even when wearing glasses/contacts?: No Does the patient have difficulty concentrating, remembering, or making decisions?: No Patient able to express need for assistance with ADLs?: Yes Does the patient have difficulty dressing or bathing?: Yes Independently performs ADLs?: No Communication: Independent Dressing (OT): Needs assistance Is this a change from baseline?: Change from baseline, expected to last >3 days Grooming: Needs assistance Is this a change from baseline?: Change from baseline, expected to last >3 days Feeding: Independent Bathing:  Needs assistance Is this a change from baseline?: Change from baseline, expected to last >3 days Toileting: Needs assistance Is this a change from baseline?: Change from baseline, expected to last >3days In/Out Bed: Needs assistance Is this a change from baseline?: Change from baseline, expected to last >3 days Walks in Home: Needs assistance Is this a change from baseline?: Change from baseline, expected to last >3 days Does the patient have difficulty walking or climbing stairs?: Yes Weakness of Legs: Both Weakness of Arms/Hands: None  Permission Sought/Granted                  Emotional Assessment Appearance:: Appears stated age Attitude/Demeanor/Rapport: Engaged Affect (typically observed): Accepting Orientation: : Oriented to Self, Oriented to Place, Oriented to  Time, Fluctuating Orientation (Suspected and/or reported Sundowners), Oriented to Situation Alcohol / Substance Use: Never Used Psych Involvement: No (comment)  Admission diagnosis:  Weakness [R53.1] Troponin I above reference range [R77.8] Wound of sacral region, initial encounter [S31.000A] General weakness [R53.1] Patient Active Problem List   Diagnosis Date Noted   Pressure injury of skin 05/22/2022   General weakness 05/22/2022   Weakness 05/21/2022   Anaphylactic shock, unspecified, initial encounter 06/29/2020   Sepsis ([place]) 06/22/2019   Pain due to onychomycosis of toenails of both feet 06/11/2019   Callus 06/11/2019   Other disorders of phosphorus metabolism 05/20/2019   Mixed hyperlipidemia 03/22/2019   Otalgia of both ears 03/22/2019   Right arm pain  03/22/2019   Bradycardia 10/01/2017   Dyslipidemia 10/01/2017   Dependence on renal dialysis ([place]) 09/01/2017   Chest pain, unspecified 12/06/2016   Non-ST elevation (NSTEMI) myocardial infarction ([place]) 12/06/2016   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   History of NSTEMI    CAD S/P percutaneous coronary angioplasty 12/03/2016   Hypokalemia  05/03/2016   Pruritus, unspecified 03/27/2015   Encounter for removal of sutures 11/30/2014   Pain in lower limb 08/26/2014   Encounter for immunization 06/10/2014   Unspecified protein-calorie malnutrition ([place]) 02/28/2014   Allergy, unspecified, initial encounter 02/15/2014   HCAP (healthcare-associated pneumonia) 02/13/2014   Absence of both cervix and uterus, acquired 02/01/2014   Coagulation defect, unspecified ([place]) 02/01/2014   Dyspnea, unspecified 02/01/2014   Family history of diabetes mellitus 02/01/2014   Family history of ischemic heart disease and other diseases of the circulatory system 02/01/2014   Fever, unspecified 02/01/2014   Headache, unspecified 02/01/2014   History of allergy to sulfonamides 02/01/2014   Iron deficiency anemia, unspecified 02/01/2014   Long term (current) use of insulin ([place]) 02/01/2014   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 [place] [address] [place] [address] Phone: [phone] Fax: [phone]  [id number] - [place], [place] - [address] [place] [address] Phone: [phone] Fax: [phone]  [facility] ([facility] ) - [place], [place] - [address] [place] [address] Phone: [phone] Fax: [phone]     Social Determinants of Health (SDOH) Interventions    Readmission Risk Interventions     05/22/2022   12:26 PM  Readmission Risk Prevention Plan  Transportation Screening Complete  PCP or Specialist Appt within 3-5 Days Complete  HRI or [place] Complete  Social Work Consult for [place] Planning/Counseling Complete  Palliative Care Screening Complete  Medication Review Press photographer) Complete

## 2022-05-23 NOTE — Progress Notes (Signed)
Established hemodialysis patient known at [facility] ([address]) TTS 11:00am. Patient stated that grandson transports on weekdays and son transports on Saturdays. Education provided. No dialysis concerns stated. Discussion about rehab placement and possible chance of transferring clinics if necessary. Patient stated that if she had to go to [facility] for rehab, she would like to be placed at pervious home clinic [facility].

## 2022-05-23 NOTE — Care Management CC44 (Signed)
Condition Code 44 Documentation Completed  Patient Details  Name: Jacqueline [name] MRN: [id number] Date of Birth: Mar 28, 1946   Condition Code 44 given:  Yes Patient signature on Condition Code 44 notice:  Yes Documentation of 2 MD's agreement:  Yes Code 44 added to claim:  Yes    Donnelly Angelica, LCSW 05/23/2022, 9:07 AM

## 2022-05-24 DIAGNOSIS — K219 Gastro-esophageal reflux disease without esophagitis: Secondary | ICD-10-CM | POA: Diagnosis not present

## 2022-05-24 DIAGNOSIS — N186 End stage renal disease: Secondary | ICD-10-CM | POA: Diagnosis not present

## 2022-05-24 DIAGNOSIS — I509 Heart failure, unspecified: Secondary | ICD-10-CM | POA: Diagnosis not present

## 2022-05-24 DIAGNOSIS — N2581 Secondary hyperparathyroidism of renal origin: Secondary | ICD-10-CM | POA: Diagnosis not present

## 2022-05-24 DIAGNOSIS — R531 Weakness: Secondary | ICD-10-CM | POA: Diagnosis not present

## 2022-05-24 DIAGNOSIS — I132 Hypertensive heart and chronic kidney disease with heart failure and with stage 5 chronic kidney disease, or end stage renal disease: Secondary | ICD-10-CM | POA: Diagnosis not present

## 2022-05-24 DIAGNOSIS — K5900 Constipation, unspecified: Secondary | ICD-10-CM | POA: Diagnosis not present

## 2022-05-24 DIAGNOSIS — L89153 Pressure ulcer of sacral region, stage 3: Secondary | ICD-10-CM | POA: Diagnosis not present

## 2022-05-24 DIAGNOSIS — E1122 Type 2 diabetes mellitus with diabetic chronic kidney disease: Secondary | ICD-10-CM | POA: Diagnosis not present

## 2022-05-24 DIAGNOSIS — Z992 Dependence on renal dialysis: Secondary | ICD-10-CM | POA: Diagnosis not present

## 2022-05-24 DIAGNOSIS — I959 Hypotension, unspecified: Secondary | ICD-10-CM | POA: Diagnosis not present

## 2022-05-24 DIAGNOSIS — D72829 Elevated white blood cell count, unspecified: Secondary | ICD-10-CM | POA: Diagnosis not present

## 2022-05-24 DIAGNOSIS — D631 Anemia in chronic kidney disease: Secondary | ICD-10-CM | POA: Diagnosis not present

## 2022-05-24 LAB — RENAL FUNCTION PANEL
Albumin: 2.8 g/dL — ABNORMAL LOW (ref 3.5–5.0)
Anion gap: 8 (ref 5–15)
BUN: 19 mg/dL (ref 8–23)
CO2: 30 mmol/L (ref 22–32)
Calcium: 8.2 mg/dL — ABNORMAL LOW (ref 8.9–10.3)
Chloride: 97 mmol/L — ABNORMAL LOW (ref 98–111)
Creatinine, Ser: 5.75 mg/dL — ABNORMAL HIGH (ref 0.44–1.00)
GFR, Estimated: 7 mL/min — ABNORMAL LOW (ref >60)
Glucose, Bld: 95 mg/dL (ref 70–99)
Phosphorus: 2.8 mg/dL (ref 2.5–4.6)
Potassium: 3.6 mmol/L (ref 3.5–5.1)
Sodium: 135 mmol/L (ref 135–145)

## 2022-05-24 LAB — CBC
HCT: 35.1 % — ABNORMAL LOW (ref 36.0–46.0)
Hemoglobin: 11 g/dL — ABNORMAL LOW (ref 12.0–15.0)
MCH: 30.3 pg (ref 26.0–34.0)
MCHC: 31.3 g/dL (ref 30.0–36.0)
MCV: 96.7 fL (ref 80.0–100.0)
Platelets: 164 10*3/uL (ref 150–400)
RBC: 3.63 MIL/uL — ABNORMAL LOW (ref 3.87–5.11)
RDW: 14.4 % (ref 11.5–15.5)
WBC: 7 10*3/uL (ref 4.0–10.5)
nRBC: 0 % (ref 0.0–0.2)

## 2022-05-24 LAB — GLUCOSE, CAPILLARY
Glucose-Capillary: 95 mg/dL (ref 70–99)
Glucose-Capillary: 124 mg/dL — ABNORMAL HIGH (ref 70–99)
Glucose-Capillary: 122 mg/dL — ABNORMAL HIGH (ref 70–99)
Glucose-Capillary: 175 mg/dL — ABNORMAL HIGH (ref 70–99)

## 2022-05-24 LAB — HEPATITIS B SURFACE ANTIBODY, QUANTITATIVE: Hep B S AB Quant (Post): 73.2 m[IU]/mL (ref >9.9)

## 2022-05-24 NOTE — TOC Progression Note (Signed)
Transition of Care [facility]) - Progression Note    Patient Details  Name: Jacqueline [name] MRN: [id number] Date of Birth: [birth year]  Transition of Care [facility]) CM/SW Contact  Durwin Glaze, LCSW Phone Number: 05/24/2022, 1:43 PM  Clinical Narrative:   Pt has recommedationd form OT & OT to go to a SNF. Pt had preferences that did not offer beds. [facility] offered yesterday , pt accepted today. Started Auth.   Expected Discharge Plan: Skilled Nursing Facility Barriers to Discharge: Continued Medical Work up  Expected Discharge Plan and Services Expected Discharge Plan: Skilled Nursing Facility                                               Social Determinants of Health (SDOH) Interventions    Readmission Risk Interventions    05/22/2022   12:26 PM  Readmission Risk Prevention Plan  Transportation Screening Complete  PCP or Specialist Appt within 3-5 Days Complete  HRI or Home Care Consult Complete  Social Work Consult for Recovery Care Planning/Counseling Complete  Palliative Care Screening Complete  Medication Review Oceanographer) Complete

## 2022-05-25 DIAGNOSIS — K219 Gastro-esophageal reflux disease without esophagitis: Secondary | ICD-10-CM | POA: Diagnosis not present

## 2022-05-25 DIAGNOSIS — R531 Weakness: Secondary | ICD-10-CM | POA: Diagnosis not present

## 2022-05-25 DIAGNOSIS — D72829 Elevated white blood cell count, unspecified: Secondary | ICD-10-CM | POA: Diagnosis not present

## 2022-05-25 DIAGNOSIS — N2581 Secondary hyperparathyroidism of renal origin: Secondary | ICD-10-CM | POA: Diagnosis not present

## 2022-05-25 DIAGNOSIS — I132 Hypertensive heart and chronic kidney disease with heart failure and with stage 5 chronic kidney disease, or end stage renal disease: Secondary | ICD-10-CM | POA: Diagnosis not present

## 2022-05-25 DIAGNOSIS — N186 End stage renal disease: Secondary | ICD-10-CM | POA: Diagnosis not present

## 2022-05-25 DIAGNOSIS — Z992 Dependence on renal dialysis: Secondary | ICD-10-CM | POA: Diagnosis not present

## 2022-05-25 DIAGNOSIS — I509 Heart failure, unspecified: Secondary | ICD-10-CM | POA: Diagnosis not present

## 2022-05-25 DIAGNOSIS — E1122 Type 2 diabetes mellitus with diabetic chronic kidney disease: Secondary | ICD-10-CM | POA: Diagnosis not present

## 2022-05-25 DIAGNOSIS — D631 Anemia in chronic kidney disease: Secondary | ICD-10-CM | POA: Diagnosis not present

## 2022-05-25 DIAGNOSIS — L89153 Pressure ulcer of sacral region, stage 3: Secondary | ICD-10-CM | POA: Diagnosis not present

## 2022-05-25 DIAGNOSIS — I959 Hypotension, unspecified: Secondary | ICD-10-CM | POA: Diagnosis not present

## 2022-05-25 DIAGNOSIS — K5900 Constipation, unspecified: Secondary | ICD-10-CM | POA: Diagnosis not present

## 2022-05-25 LAB — GLUCOSE, CAPILLARY
Glucose-Capillary: 96 mg/dL (ref 70–99)
Glucose-Capillary: 98 mg/dL (ref 70–99)
Glucose-Capillary: 98 mg/dL (ref 70–99)
Glucose-Capillary: 126 mg/dL — ABNORMAL HIGH (ref 70–99)

## 2022-05-25 NOTE — Progress Notes (Signed)
[facility]  ROUNDING NOTE   Subjective:   Patient was seen today on dialysis Patient was tolerating treatment well Patient thereafter had rise in venous pressure .In an effort to prevent blood loss patient's renal placement therapy was terminated early   Objective:  Vital signs in last 24 hours:  Temp:  [97.4 F (36.3 C)-98.6 F (37 C)] 97.7 F (36.5 C) (06/24 0433) Pulse Rate:  [60-74] 73 (06/24 0433) Resp:  [16-18] 18 (06/24 0433) BP: (90-105)/(43-52) 105/47 (06/24 0433) SpO2:  [92 %-100 %] 100 % (06/24 0433) Weight:  [101.7 kg] 101.7 kg (06/24 0500)  Weight change: -0.1 kg Filed Weights   05/23/22 0934 05/23/22 1303 05/25/22 0500  Weight: 101.8 kg 101.4 kg 101.7 kg    Intake/Output: I/O last 3 completed shifts: In: 620 [P.O.:620] Out: -    Intake/Output this shift:  No intake/output data recorded.  Physical Exam: General: NAD, sitting up in bed  Head: Normocephalic, atraumatic. Moist oral mucosal membranes  Eyes: Anicteric  Lungs:  Clear to auscultation, normal effort  Heart: Regular rate and rhythm  Abdomen:  Soft, nontender, obese  Extremities: No peripheral edema.  Neurologic: Nonfocal, moving all four extremities  Skin: No lesions, bilateral heel wounds  Access: Left upper aVF    Basic Metabolic Panel: Recent Labs  Lab 05/21/22 1931 05/22/22 0446 05/24/22 0853  NA 138  --  135  K 4.2  --  3.6  CL 96*  --  97*  CO2 28  --  30  GLUCOSE 122*  --  95  BUN 16  --  19  CREATININE 3.98* 5.04* 5.75*  CALCIUM 9.4  --  8.2*  PHOS  --   --  2.8    Liver Function Tests: Recent Labs  Lab 05/21/22 1931 05/24/22 0853  AST 36  --   ALT 20  --   ALKPHOS 84  --   BILITOT 1.0  --   PROT 8.1  --   ALBUMIN 3.5 2.8*   No results for input(s): "LIPASE", "AMYLASE" in the last 168 hours. No results for input(s): "AMMONIA" in the last 168 hours.  CBC: Recent Labs  Lab 05/21/22 1931 05/22/22 0446 05/24/22 0853  WBC 12.3* 9.9 7.0   NEUTROABS 9.6*  --   --   HGB 12.6 11.5* 11.0*  HCT 41.0 36.9 35.1*  MCV 96.7 94.9 96.7  PLT 205 194 164    Cardiac Enzymes: No results for input(s): "CKTOTAL", "CKMB", "CKMBINDEX", "TROPONINI" in the last 168 hours.  BNP: Invalid input(s): "POCBNP"  CBG: Recent Labs  Lab 05/23/22 2118 05/24/22 0832 05/24/22 1125 05/24/22 1618 05/24/22 2128  GLUCAP 169* 95 124* 122* 175*    Microbiology: Results for orders placed or performed during the hospital encounter of 12/06/21  Resp Panel by RT-PCR (Flu A&B, Covid) Nasopharyngeal Swab     Status: Abnormal   Collection Time: 12/06/21  2:16 PM   Specimen: Nasopharyngeal Swab; Nasopharyngeal(NP) swabs in vial transport medium  Result Value Ref Range Status   SARS Coronavirus 2 by RT PCR POSITIVE (A) NEGATIVE Final    Comment: (NOTE) SARS-CoV-2 target nucleic acids are DETECTED.  The SARS-CoV-2 RNA is generally detectable in upper respiratory specimens during the acute phase of infection. Positive results are indicative of the presence of the identified virus, but do not rule out bacterial infection or co-infection with other pathogens not detected by the test. Clinical correlation with patient history and other diagnostic information is necessary to determine patient infection status.  The expected result is Negative.  Fact Sheet for Patients: BloggerCourse.com  Fact Sheet for Healthcare Providers: SeriousBroker.it  This test is not yet approved or cleared by the [place] FDA and  has been authorized for detection and/or diagnosis of SARS-CoV-2 by FDA under an Emergency Use Authorization (EUA).  This EUA will remain in effect (meaning this test can be used) for the duration of  the COVID-19 declaration under Section 564(b)(1) of the A ct, 21 U.S.C. section 360bbb-3(b)(1), unless the authorization is terminated or revoked sooner.     Influenza A by PCR NEGATIVE NEGATIVE  Final   Influenza B by PCR NEGATIVE NEGATIVE Final    Comment: (NOTE) The Xpert Xpress SARS-CoV-2/FLU/RSV plus assay is intended as an aid in the diagnosis of influenza from Nasopharyngeal swab specimens and should not be used as a sole basis for treatment. Nasal washings and aspirates are unacceptable for Xpert Xpress SARS-CoV-2/FLU/RSV testing.  Fact Sheet for Patients: BloggerCourse.com  Fact Sheet for Healthcare Providers: SeriousBroker.it  This test is not yet approved or cleared by the [place] FDA and has been authorized for detection and/or diagnosis of SARS-CoV-2 by FDA under an Emergency Use Authorization (EUA). This EUA will remain in effect (meaning this test can be used) for the duration of the COVID-19 declaration under Section 564(b)(1) of the Act, 21 U.S.C. section 360bbb-3(b)(1), unless the authorization is terminated or revoked.  Performed at [facility], [address] [place], [place] [address]     Coagulation Studies: No results for input(s): "LABPROT", "INR" in the last 72 hours.  Urinalysis: No results for input(s): "COLORURINE", "LABSPEC", "PHURINE", "GLUCOSEU", "HGBUR", "BILIRUBINUR", "KETONESUR", "PROTEINUR", "UROBILINOGEN", "NITRITE", "LEUKOCYTESUR" in the last 72 hours.  Invalid input(s): "APPERANCEUR"    Imaging: No results found.   Medications:     aspirin EC  81 mg Oral Daily   Chlorhexidine Gluconate Cloth  6 each Topical Q0600   cinacalcet  30 mg Oral Q breakfast   ezetimibe  10 mg Oral Daily   ferric citrate  420 mg Oral BID WC   heparin  5,000 Units Subcutaneous Q8H   midodrine  10 mg Oral Q T,Th,Sa-HD   pantoprazole  40 mg Oral Daily   rosuvastatin  40 mg Oral Daily   acetaminophen, albuterol, ondansetron **OR** ondansetron (ZOFRAN) IV  Assessment/ Plan:  [name] is a 76 y.o.  female with past medical conditions including hyperlipidemia, CAD with  stent, arthritis, hypertension, diabetes, GERD, obesity, chronic wounds, end-stage renal disease on hemodialysis, who was admitted to [facility] on 05/21/2022 for Weakness [R53.1] Troponin I above reference range [R77.8] Wound of sacral region, initial encounter [S31.000A] General weakness [R53.1]  [facility]/TTS/left aVF  Hypotension with chronic kidney disease. Home regimen includes midodrine on dialysis days only.  Blood pressure remains, 96/47.  2. End-stage renal disease on hemodialysis.     Patient is on Tuesday Thursday Saturday schedule  Will dialyze patient today  3. 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  We will continue to monitor   4. Secondary Hyperparathyroidism: Lab Results  Component Value Date   PTH 388.6 (H) 01/17/2014   CALCIUM 8.2 (L) 05/24/2022   CAION 1.12 10/16/2011   PHOS 2.8 05/24/2022    Calcium and phosphorus remain within acceptable range. Pyrexia ordered with meals.  Cinacalcet ordered daily   5.  Malfunction of dialysis access We will  follow closely if patient again has high venous pressure we will ask for vascular surgery help   LOS: 1 [place]na Grout s [facility] 6/24/20238:45 AM

## 2022-05-26 DIAGNOSIS — D72829 Elevated white blood cell count, unspecified: Secondary | ICD-10-CM | POA: Diagnosis not present

## 2022-05-26 DIAGNOSIS — N186 End stage renal disease: Secondary | ICD-10-CM | POA: Diagnosis not present

## 2022-05-26 DIAGNOSIS — N2581 Secondary hyperparathyroidism of renal origin: Secondary | ICD-10-CM | POA: Diagnosis not present

## 2022-05-26 DIAGNOSIS — I132 Hypertensive heart and chronic kidney disease with heart failure and with stage 5 chronic kidney disease, or end stage renal disease: Secondary | ICD-10-CM | POA: Diagnosis not present

## 2022-05-26 DIAGNOSIS — K219 Gastro-esophageal reflux disease without esophagitis: Secondary | ICD-10-CM | POA: Diagnosis not present

## 2022-05-26 DIAGNOSIS — E1122 Type 2 diabetes mellitus with diabetic chronic kidney disease: Secondary | ICD-10-CM | POA: Diagnosis not present

## 2022-05-26 DIAGNOSIS — L89153 Pressure ulcer of sacral region, stage 3: Secondary | ICD-10-CM | POA: Diagnosis not present

## 2022-05-26 DIAGNOSIS — D631 Anemia in chronic kidney disease: Secondary | ICD-10-CM | POA: Diagnosis not present

## 2022-05-26 DIAGNOSIS — I959 Hypotension, unspecified: Secondary | ICD-10-CM | POA: Diagnosis not present

## 2022-05-26 DIAGNOSIS — K5900 Constipation, unspecified: Secondary | ICD-10-CM | POA: Diagnosis not present

## 2022-05-26 DIAGNOSIS — R531 Weakness: Secondary | ICD-10-CM | POA: Diagnosis not present

## 2022-05-26 DIAGNOSIS — Z992 Dependence on renal dialysis: Secondary | ICD-10-CM | POA: Diagnosis not present

## 2022-05-26 DIAGNOSIS — I509 Heart failure, unspecified: Secondary | ICD-10-CM | POA: Diagnosis not present

## 2022-05-26 LAB — GLUCOSE, CAPILLARY
Glucose-Capillary: 100 mg/dL — ABNORMAL HIGH (ref 70–99)
Glucose-Capillary: 116 mg/dL — ABNORMAL HIGH (ref 70–99)
Glucose-Capillary: 166 mg/dL — ABNORMAL HIGH (ref 70–99)
Glucose-Capillary: 129 mg/dL — ABNORMAL HIGH (ref 70–99)

## 2022-05-26 NOTE — Progress Notes (Signed)
[facility]  ROUNDING NOTE   Subjective:   Patient was seen today on second floor Patient offers no new specific physical complaints I did asked the patient that yesterday during dialysis elevated pressure was high so he could not complete the full treatment, I discussed the patient I will try to dialyze her again tomorrow and if still have problem with access then we will ask for vascular surgery/interventional radiology help   Objective:  Vital signs in last 24 hours:  Temp:  [97.6 F (36.4 C)-98.9 F (37.2 C)] 98.4 F (36.9 C) (06/25 0750) Pulse Rate:  [59-71] 69 (06/25 0750) Resp:  [13-23] 16 (06/25 0750) BP: (59-117)/(39-59) 110/39 (06/25 0750) SpO2:  [92 %-100 %] 100 % (06/25 0750) Weight:  [104.8 kg] 104.8 kg (06/24 1211)  Weight change: 3.1 kg Filed Weights   05/25/22 0500 05/25/22 0922 05/25/22 1211  Weight: 101.7 kg 104.8 kg 104.8 kg    Intake/Output: I/O last 3 completed shifts: In: -  Out: 261 [Other:261]   Intake/Output this shift:  No intake/output data recorded.  Physical Exam: General: NAD, sitting up in bed  Head: Normocephalic, atraumatic. Moist oral mucosal membranes  Eyes: Anicteric  Lungs:  Clear to auscultation, normal effort  Heart: Regular rate and rhythm  Abdomen:  Soft, nontender, obese  Extremities: No peripheral edema.  Neurologic: Nonfocal, moving all four extremities  Skin: No lesions, bilateral heel wounds  Access: Left upper aVF    Basic Metabolic Panel: Recent Labs  Lab 05/21/22 1931 05/22/22 0446 05/24/22 0853  NA 138  --  135  K 4.2  --  3.6  CL 96*  --  97*  CO2 28  --  30  GLUCOSE 122*  --  95  BUN 16  --  19  CREATININE 3.98* 5.04* 5.75*  CALCIUM 9.4  --  8.2*  PHOS  --   --  2.8    Liver Function Tests: Recent Labs  Lab 05/21/22 1931 05/24/22 0853  AST 36  --   ALT 20  --   ALKPHOS 84  --   BILITOT 1.0  --   PROT 8.1  --   ALBUMIN 3.5 2.8*   No results for input(s): "LIPASE", "AMYLASE"  in the last 168 hours. No results for input(s): "AMMONIA" in the last 168 hours.  CBC: Recent Labs  Lab 05/21/22 1931 05/22/22 0446 05/24/22 0853  WBC 12.3* 9.9 7.0  NEUTROABS 9.6*  --   --   HGB 12.6 11.5* 11.0*  HCT 41.0 36.9 35.1*  MCV 96.7 94.9 96.7  PLT 205 194 164    Cardiac Enzymes: No results for input(s): "CKTOTAL", "CKMB", "CKMBINDEX", "TROPONINI" in the last 168 hours.  BNP: Invalid input(s): "POCBNP"  CBG: Recent Labs  Lab 05/25/22 0847 05/25/22 1309 05/25/22 1627 05/25/22 2103 05/26/22 0751  GLUCAP 96 98 98 126* 100*    Microbiology: Results for orders placed or performed during the hospital encounter of 12/06/21  Resp Panel by RT-PCR (Flu A&B, Covid) Nasopharyngeal Swab     Status: Abnormal   Collection Time: 12/06/21  2:16 PM   Specimen: Nasopharyngeal Swab; Nasopharyngeal(NP) swabs in vial transport medium  Result Value Ref Range Status   SARS Coronavirus 2 by RT PCR POSITIVE (A) NEGATIVE Final    Comment: (NOTE) SARS-CoV-2 target nucleic acids are DETECTED.  The SARS-CoV-2 RNA is generally detectable in upper respiratory specimens during the acute phase of infection. Positive results are indicative of the presence of the identified virus, but do  not rule out bacterial infection or co-infection with other pathogens not detected by the test. Clinical correlation with patient history and other diagnostic information is necessary to determine patient infection status. The expected result is Negative.  Fact Sheet for Patients: BloggerCourse.com  Fact Sheet for Healthcare Providers: SeriousBroker.it  This test is not yet approved or cleared by the [place] FDA and  has been authorized for detection and/or diagnosis of SARS-CoV-2 by FDA under an Emergency Use Authorization (EUA).  This EUA will remain in effect (meaning this test can be used) for the duration of  the COVID-19 declaration  under Section 564(b)(1) of the A ct, 21 U.S.C. section 360bbb-3(b)(1), unless the authorization is terminated or revoked sooner.     Influenza A by PCR NEGATIVE NEGATIVE Final   Influenza B by PCR NEGATIVE NEGATIVE Final    Comment: (NOTE) The Xpert Xpress SARS-CoV-2/FLU/RSV plus assay is intended as an aid in the diagnosis of influenza from Nasopharyngeal swab specimens and should not be used as a sole basis for treatment. Nasal washings and aspirates are unacceptable for Xpert Xpress SARS-CoV-2/FLU/RSV testing.  Fact Sheet for Patients: BloggerCourse.com  Fact Sheet for Healthcare Providers: SeriousBroker.it  This test is not yet approved or cleared by the [place] FDA and has been authorized for detection and/or diagnosis of SARS-CoV-2 by FDA under an Emergency Use Authorization (EUA). This EUA will remain in effect (meaning this test can be used) for the duration of the COVID-19 declaration under Section 564(b)(1) of the Act, 21 U.S.C. section 360bbb-3(b)(1), unless the authorization is terminated or revoked.  Performed at [facility], [address], [place], [place] [address]     Coagulation Studies: No results for input(s): "LABPROT", "INR" in the last 72 hours.  Urinalysis: No results for input(s): "COLORURINE", "LABSPEC", "PHURINE", "GLUCOSEU", "HGBUR", "BILIRUBINUR", "KETONESUR", "PROTEINUR", "UROBILINOGEN", "NITRITE", "LEUKOCYTESUR" in the last 72 hours.  Invalid input(s): "APPERANCEUR"    Imaging: No results found.   Medications:     aspirin EC  81 mg Oral Daily   Chlorhexidine Gluconate Cloth  6 each Topical Q0600   cinacalcet  30 mg Oral Q breakfast   ezetimibe  10 mg Oral Daily   ferric citrate  420 mg Oral BID WC   heparin  5,000 Units Subcutaneous Q8H   midodrine  10 mg Oral Q T,Th,Sa-HD   pantoprazole  40 mg Oral Daily   rosuvastatin  40 mg Oral Daily   acetaminophen, albuterol,  ondansetron **OR** ondansetron (ZOFRAN) IV  Assessment/ Plan:  [name] is a 76 y.o.  female with past medical conditions including hyperlipidemia, CAD with stent, arthritis, hypertension, diabetes, GERD, obesity, chronic wounds, end-stage renal disease on hemodialysis, who was admitted to [facility] on 05/21/2022 for Weakness [R53.1] Troponin I above reference range [R77.8] Wound of sacral region, initial encounter [S31.000A] General weakness [R53.1]  [facility]/TTS/left aVF  Hypotension with chronic kidney disease. Home regimen includes midodrine on dialysis days only.  Blood pressure remains, 96/47.  2. End-stage renal disease on hemodialysis.     Patient is on Tuesday Thursday Saturday schedule    Patient had partial session yesterday    We will try to dialyze patient tomorrow  3. 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  We will continue to monitor   4. Secondary Hyperparathyroidism: Lab Results  Component Value Date   PTH 388.6 (H) 01/17/2014  CALCIUM 8.2 (L) 05/24/2022   CAION 1.12 10/16/2011   PHOS 2.8 05/24/2022    Calcium and phosphorus remain within acceptable range. Pyrexia ordered with meals.  Cinacalcet ordered daily   5.  Malfunction of dialysis access We will follow closely if patient again has high venous pressure we will ask for vascular surgery help   LOS: 1 Rosemond Lyttle s [facility] 6/25/20239:26 AM

## 2022-05-26 NOTE — Progress Notes (Signed)
Occupational Therapy Treatment Patient Details Name: ARELY MCCAMBRIDGE MRN: [id number] DOB: 07-05-1946 Today's Date: 05/26/2022   History of present illness Pt is a 76 yo female that presented to the ED due to progression of chronic debility and difficulty for family to care for, as well as wound check. PMH of HTN, DMII,ESRD on HD TTS, Chronic debility , CAD s/p stent, Left knee arthritis, Chronic lower back pain,GERD,Obesity , HLD.   OT comments  Upon entering the room, pt supine in bed but agreeable to OT intervention. Pt declines toileting needs. Supine >sit with min A for truck support at EOB. Pt needing assistance to don B shoes and reports family assists at home. Pt stands with min A from slightly elevated bed and ambulates 30' with RW and min guard. She returns to sit in recliner chair with RN notified of pt's position. Pt given call bell and all needed items before therapist exits the room. OT continues to recommend discharge to short term rehab to increase independence in self care tasks before returning home.    Recommendations for follow up therapy are one component of a multi-disciplinary discharge planning process, led by the attending physician.  Recommendations may be updated based on patient status, additional functional criteria and insurance authorization.    Follow Up Recommendations  Skilled nursing-short term rehab (<3 hours/day)    Assistance Recommended at Discharge Frequent or constant Supervision/Assistance  Patient can return home with the following  Help with stairs or ramp for entrance;Assistance with cooking/housework;A little help with walking and/or transfers;A lot of help with bathing/dressing/bathroom   Equipment Recommendations  Other (comment) (defer to next venue of care)       Precautions / Restrictions Precautions Precautions: Fall Restrictions Weight Bearing Restrictions: No       Mobility Bed Mobility Overal bed mobility: Needs Assistance Bed  Mobility: Supine to Sit     Supine to sit: Min assist     General bed mobility comments: assist for trunk support and min cuing for technique    Transfers Overall transfer level: Needs assistance Equipment used: Rolling walker (2 wheels) Transfers: Sit to/from Stand Sit to Stand: Min assist, From elevated surface                 Balance Overall balance assessment: Needs assistance Sitting-balance support: Feet supported Sitting balance-Leahy Scale: Good     Standing balance support: Reliant on assistive device for balance Standing balance-Leahy Scale: Fair Standing balance comment: min guard for balance with ambulation                           ADL either performed or assessed with clinical judgement   ADL Overall ADL's : Needs assistance/impaired                     Lower Body Dressing: Maximal assistance Lower Body Dressing Details (indicate cue type and reason): to don B socks and shoes but report family assists at home             Functional mobility during ADLs: Min guard;Rolling walker (2 wheels)      Extremity/Trunk Assessment              Vision Patient Visual Report: No change from baseline            Cognition Arousal/Alertness: Awake/alert Behavior During Therapy: Flat affect, WFL for tasks assessed/performed Overall Cognitive Status: Within Functional Limits for tasks assessed  General Comments: A&Ox4, pleasant                   Pertinent Vitals/ Pain       Pain Assessment Pain Assessment: 0-10 Pain Score: 4  Pain Location: B feet Pain Descriptors / Indicators: Burning Pain Intervention(s): Monitored during session, Repositioned, Other (comment) (donned shoes for ambulation/transfer)         Frequency  Min 2X/week        Progress Toward Goals  OT Goals(current goals can now be found in the care plan section)  Progress towards OT goals: Progressing  toward goals  Acute Rehab OT Goals Patient Stated Goal: to get better and go home OT Goal Formulation: With patient Time For Goal Achievement: 06/05/22 Potential to Achieve Goals: Good  Plan Discharge plan remains appropriate;Frequency remains appropriate       AM-PAC OT "6 Clicks" Daily Activity     Outcome Measure   Help from another person eating meals?: A Little Help from another person taking care of personal grooming?: A Little Help from another person toileting, which includes using toliet, bedpan, or urinal?: A Lot Help from another person bathing (including washing, rinsing, drying)?: A Little Help from another person to put on and taking off regular upper body clothing?: A Little Help from another person to put on and taking off regular lower body clothing?: A Lot 6 Click Score: 16    End of Session Equipment Utilized During Treatment: Rolling walker (2 wheels)  OT Visit Diagnosis: Muscle weakness (generalized) (M62.81);Other abnormalities of gait and mobility (R26.89);Pain   Activity Tolerance Patient tolerated treatment well   Patient Left with call bell/phone within reach;in chair;with chair alarm set   Nurse Communication Mobility status        Time: 6213-0865 OT Time Calculation (min): 15 min  Charges: OT General Charges $OT Visit: 1 Visit OT Treatments $Therapeutic Activity: 8-22 mins  Jackquline Denmark, MS, OTR/L , CBIS ascom [phone]  05/26/22, 10:31 AM

## 2022-05-27 DIAGNOSIS — N2581 Secondary hyperparathyroidism of renal origin: Secondary | ICD-10-CM | POA: Diagnosis not present

## 2022-05-27 DIAGNOSIS — Z992 Dependence on renal dialysis: Secondary | ICD-10-CM | POA: Diagnosis not present

## 2022-05-27 DIAGNOSIS — N186 End stage renal disease: Secondary | ICD-10-CM | POA: Diagnosis not present

## 2022-05-27 DIAGNOSIS — D72829 Elevated white blood cell count, unspecified: Secondary | ICD-10-CM | POA: Diagnosis not present

## 2022-05-27 DIAGNOSIS — E1122 Type 2 diabetes mellitus with diabetic chronic kidney disease: Secondary | ICD-10-CM | POA: Diagnosis not present

## 2022-05-27 DIAGNOSIS — R531 Weakness: Secondary | ICD-10-CM | POA: Diagnosis not present

## 2022-05-27 DIAGNOSIS — D631 Anemia in chronic kidney disease: Secondary | ICD-10-CM | POA: Diagnosis not present

## 2022-05-27 DIAGNOSIS — I959 Hypotension, unspecified: Secondary | ICD-10-CM | POA: Diagnosis not present

## 2022-05-27 DIAGNOSIS — I132 Hypertensive heart and chronic kidney disease with heart failure and with stage 5 chronic kidney disease, or end stage renal disease: Secondary | ICD-10-CM | POA: Diagnosis not present

## 2022-05-27 DIAGNOSIS — L89153 Pressure ulcer of sacral region, stage 3: Secondary | ICD-10-CM | POA: Diagnosis not present

## 2022-05-27 DIAGNOSIS — K219 Gastro-esophageal reflux disease without esophagitis: Secondary | ICD-10-CM | POA: Diagnosis not present

## 2022-05-27 DIAGNOSIS — I509 Heart failure, unspecified: Secondary | ICD-10-CM | POA: Diagnosis not present

## 2022-05-27 DIAGNOSIS — K5900 Constipation, unspecified: Secondary | ICD-10-CM | POA: Diagnosis not present

## 2022-05-27 LAB — CBC
HCT: 34.6 % — ABNORMAL LOW (ref 36.0–46.0)
Hemoglobin: 10.9 g/dL — ABNORMAL LOW (ref 12.0–15.0)
MCH: 30.4 pg (ref 26.0–34.0)
MCHC: 31.5 g/dL (ref 30.0–36.0)
MCV: 96.4 fL (ref 80.0–100.0)
Platelets: 171 10*3/uL (ref 150–400)
RBC: 3.59 MIL/uL — ABNORMAL LOW (ref 3.87–5.11)
RDW: 14.3 % (ref 11.5–15.5)
WBC: 8.4 10*3/uL (ref 4.0–10.5)
nRBC: 0 % (ref 0.0–0.2)

## 2022-05-27 LAB — RENAL FUNCTION PANEL
Albumin: 2.8 g/dL — ABNORMAL LOW (ref 3.5–5.0)
Anion gap: 11 (ref 5–15)
BUN: 46 mg/dL — ABNORMAL HIGH (ref 8–23)
CO2: 29 mmol/L (ref 22–32)
Calcium: 8.3 mg/dL — ABNORMAL LOW (ref 8.9–10.3)
Chloride: 96 mmol/L — ABNORMAL LOW (ref 98–111)
Creatinine, Ser: 8.37 mg/dL — ABNORMAL HIGH (ref 0.44–1.00)
GFR, Estimated: 5 mL/min — ABNORMAL LOW (ref >60)
Glucose, Bld: 99 mg/dL (ref 70–99)
Phosphorus: 2.3 mg/dL — ABNORMAL LOW (ref 2.5–4.6)
Potassium: 3.9 mmol/L (ref 3.5–5.1)
Sodium: 136 mmol/L (ref 135–145)

## 2022-05-27 LAB — GLUCOSE, CAPILLARY
Glucose-Capillary: 89 mg/dL (ref 70–99)
Glucose-Capillary: 103 mg/dL — ABNORMAL HIGH (ref 70–99)
Glucose-Capillary: 154 mg/dL — ABNORMAL HIGH (ref 70–99)
Glucose-Capillary: 112 mg/dL — ABNORMAL HIGH (ref 70–99)

## 2022-05-27 NOTE — Progress Notes (Signed)
Physical Therapy Treatment Patient Details Name: Jacqueline [name] MRN: [id number] DOB: 02-Dec-1946 Today's Date: 05/27/2022   History of Present Illness Pt is a 76 yo female that presented to the ED due to progression of chronic debility and difficulty for family to care for, as well as wound check. PMH of HTN, DMII,ESRD on HD TTS, Chronic debility , CAD s/p stent, Left knee arthritis, Chronic lower back pain,GERD,Obesity , HLD.    PT Comments    In chair.  Pt is unable to stand without +1 min assist.  She is able to progress gait 90' with min guard and after seated rest, she stands for standing ex as below.    Pt stated husband who recently passed was her caregiver and she is having increased difficulty at home and has been staying with family and struggling.  SNF remains appropriate for discharge.   Recommendations for follow up therapy are one component of a multi-disciplinary discharge planning process, led by the attending physician.  Recommendations may be updated based on patient status, additional functional criteria and insurance authorization.  Follow Up Recommendations  Skilled nursing-short term rehab (<3 hours/day) Can patient physically be transported by private vehicle: Yes   Assistance Recommended at Discharge Frequent or constant Supervision/Assistance  Patient can return home with the following A little help with walking and/or transfers;A little help with bathing/dressing/bathroom;Assistance with cooking/housework;Assist for transportation;Help with stairs or ramp for entrance   Equipment Recommendations  Rolling walker (2 wheels);BSC/3in1    Recommendations for Other Services       Precautions / Restrictions Precautions Precautions: Fall Restrictions Weight Bearing Restrictions: No     Mobility  Bed Mobility               General bed mobility comments: in chair before and after    Transfers Overall transfer level: Needs assistance Equipment used:  Rolling walker (2 wheels) Transfers: Sit to/from Stand Sit to Stand: Min assist, From elevated surface           General transfer comment: unable to stand without assist    Ambulation/Gait Ambulation/Gait assistance: Min guard Gait Distance (Feet): 90 Feet Assistive device: Rolling walker (2 wheels) Gait Pattern/deviations: Step-through pattern, Decreased step length - right, Decreased step length - left       General Gait Details: decreased velocity, decreased step length/height   Stairs             Wheelchair Mobility    Modified Rankin (Stroke Patients Only)       Balance Overall balance assessment: Needs assistance Sitting-balance support: Feet supported Sitting balance-Leahy Scale: Good     Standing balance support: Reliant on assistive device for balance Standing balance-Leahy Scale: Fair Standing balance comment: min guard for balance with ambulation                            Cognition Arousal/Alertness: Awake/alert Behavior During Therapy: WFL for tasks assessed/performed Overall Cognitive Status: Within Functional Limits for tasks assessed                                          Exercises Other Exercises Other Exercises: standing ex x 10 with walker support    General Comments        Pertinent Vitals/Pain Pain Assessment Pain Assessment: No/denies pain    Home Living  Prior Function            PT Goals (current goals can now be found in the care plan section) Progress towards PT goals: Progressing toward goals    Frequency    Min 2X/week      PT Plan Current plan remains appropriate    Co-evaluation              AM-PAC PT "6 Clicks" Mobility   Outcome Measure  Help needed turning from your back to your side while in a flat bed without using bedrails?: A Little Help needed moving from lying on your back to sitting on the side of a flat bed without  using bedrails?: A Little Help needed moving to and from a bed to a chair (including a wheelchair)?: A Little Help needed standing up from a chair using your arms (e.g., wheelchair or bedside chair)?: A Little Help needed to walk in hospital room?: A Little Help needed climbing 3-5 steps with a railing? : A Lot 6 Click Score: 17    End of Session Equipment Utilized During Treatment: Gait belt Activity Tolerance: Patient tolerated treatment well Patient left: in chair;with call bell/phone within reach Nurse Communication: Mobility status PT Visit Diagnosis: Other abnormalities of gait and mobility (R26.89);Difficulty in walking, not elsewhere classified (R26.2);Muscle weakness (generalized) (M62.81)     Time: 6045-4098 PT Time Calculation (min) (ACUTE ONLY): 9 min  Charges:  $Gait Training: 8-22 mins                   Danielle Dess, PTA 05/27/22, 2:09 PM

## 2022-05-28 DIAGNOSIS — D631 Anemia in chronic kidney disease: Secondary | ICD-10-CM | POA: Diagnosis not present

## 2022-05-28 DIAGNOSIS — Z992 Dependence on renal dialysis: Secondary | ICD-10-CM | POA: Diagnosis not present

## 2022-05-28 DIAGNOSIS — I509 Heart failure, unspecified: Secondary | ICD-10-CM | POA: Diagnosis not present

## 2022-05-28 DIAGNOSIS — I959 Hypotension, unspecified: Secondary | ICD-10-CM | POA: Diagnosis not present

## 2022-05-28 DIAGNOSIS — K5900 Constipation, unspecified: Secondary | ICD-10-CM | POA: Diagnosis not present

## 2022-05-28 DIAGNOSIS — N186 End stage renal disease: Secondary | ICD-10-CM | POA: Diagnosis not present

## 2022-05-28 DIAGNOSIS — K219 Gastro-esophageal reflux disease without esophagitis: Secondary | ICD-10-CM | POA: Diagnosis not present

## 2022-05-28 DIAGNOSIS — D72829 Elevated white blood cell count, unspecified: Secondary | ICD-10-CM | POA: Diagnosis not present

## 2022-05-28 DIAGNOSIS — E1122 Type 2 diabetes mellitus with diabetic chronic kidney disease: Secondary | ICD-10-CM | POA: Diagnosis not present

## 2022-05-28 DIAGNOSIS — L89153 Pressure ulcer of sacral region, stage 3: Secondary | ICD-10-CM | POA: Diagnosis not present

## 2022-05-28 DIAGNOSIS — R531 Weakness: Secondary | ICD-10-CM | POA: Diagnosis not present

## 2022-05-28 DIAGNOSIS — I132 Hypertensive heart and chronic kidney disease with heart failure and with stage 5 chronic kidney disease, or end stage renal disease: Secondary | ICD-10-CM | POA: Diagnosis not present

## 2022-05-28 DIAGNOSIS — N2581 Secondary hyperparathyroidism of renal origin: Secondary | ICD-10-CM | POA: Diagnosis not present

## 2022-05-28 LAB — GLUCOSE, CAPILLARY
Glucose-Capillary: 78 mg/dL (ref 70–99)
Glucose-Capillary: 121 mg/dL — ABNORMAL HIGH (ref 70–99)
Glucose-Capillary: 94 mg/dL (ref 70–99)
Glucose-Capillary: 124 mg/dL — ABNORMAL HIGH (ref 70–99)

## 2022-05-28 MED ORDER — HEPARIN SODIUM (PORCINE) 1000 UNIT/ML IJ SOLN
INTRAMUSCULAR | Status: AC
  Administered 2022-05-28: 1000 [IU]
  Filled 2022-05-28: qty 10

## 2022-05-28 MED ORDER — MIDODRINE HCL 5 MG PO TABS
ORAL_TABLET | ORAL | Status: DC
Start: 2022-05-28 — End: 2022-05-28
  Filled 2022-05-28: qty 2

## 2022-05-28 MED ORDER — LIDOCAINE HCL (PF) 1 % IJ SOLN
5.0000 mL | INTRAMUSCULAR | Status: DC | PRN
  Filled 2022-05-28: qty 5

## 2022-05-28 MED ORDER — PENTAFLUOROPROP-TETRAFLUOROETH EX AERO
1.0000 | INHALATION_SPRAY | CUTANEOUS | Status: DC | PRN
Start: 2022-05-28 — End: 2022-05-28

## 2022-05-28 MED ORDER — MIDODRINE HCL 5 MG PO TABS
10.0000 mg | ORAL_TABLET | Freq: Once | ORAL | Status: AC
Start: 2022-05-28 — End: 2022-05-28
  Administered 2022-05-28: 10 mg via ORAL
  Filled 2022-05-28: qty 2

## 2022-05-28 MED ORDER — ALTEPLASE 2 MG IJ SOLR: 2.0000 mg | Freq: Once | INTRAMUSCULAR | Status: DC | PRN

## 2022-05-28 MED ORDER — HEPARIN SODIUM (PORCINE) 1000 UNIT/ML DIALYSIS
1000.0000 [IU] | INTRAMUSCULAR | Status: DC | PRN
Start: 2022-05-28 — End: 2022-05-28

## 2022-05-28 MED ORDER — LIDOCAINE-PRILOCAINE 2.5-2.5 % EX CREA: 1.0000 | TOPICAL_CREAM | CUTANEOUS | Status: DC | PRN

## 2022-05-28 NOTE — Plan of Care (Signed)
?  Problem: Education: Goal: Knowledge of General Education information will improve Description: Including pain rating scale, medication(s)/side effects and non-pharmacologic comfort measures Outcome: Progressing   Problem: Health Behavior/Discharge Planning: Goal: Ability to manage health-related needs will improve Outcome: Progressing   Problem: Clinical Measurements: Goal: Ability to maintain clinical measurements within normal limits will improve Outcome: Progressing Goal: Will remain free from infection Outcome: Progressing Goal: Diagnostic test results will improve Outcome: Progressing Goal: Respiratory complications will improve Outcome: Progressing Goal: Cardiovascular complication will be avoided Outcome: Progressing   Problem: Skin Integrity: Goal: Risk for impaired skin integrity will decrease Outcome: Progressing   

## 2022-05-29 DIAGNOSIS — I509 Heart failure, unspecified: Secondary | ICD-10-CM | POA: Diagnosis not present

## 2022-05-29 DIAGNOSIS — E1122 Type 2 diabetes mellitus with diabetic chronic kidney disease: Secondary | ICD-10-CM | POA: Diagnosis not present

## 2022-05-29 DIAGNOSIS — N2581 Secondary hyperparathyroidism of renal origin: Secondary | ICD-10-CM | POA: Diagnosis not present

## 2022-05-29 DIAGNOSIS — N186 End stage renal disease: Secondary | ICD-10-CM | POA: Diagnosis not present

## 2022-05-29 DIAGNOSIS — Z992 Dependence on renal dialysis: Secondary | ICD-10-CM | POA: Diagnosis not present

## 2022-05-29 DIAGNOSIS — K5909 Other constipation: Secondary | ICD-10-CM

## 2022-05-29 DIAGNOSIS — D631 Anemia in chronic kidney disease: Secondary | ICD-10-CM | POA: Diagnosis not present

## 2022-05-29 DIAGNOSIS — I132 Hypertensive heart and chronic kidney disease with heart failure and with stage 5 chronic kidney disease, or end stage renal disease: Secondary | ICD-10-CM | POA: Diagnosis not present

## 2022-05-29 DIAGNOSIS — R531 Weakness: Secondary | ICD-10-CM | POA: Diagnosis not present

## 2022-05-29 DIAGNOSIS — K5900 Constipation, unspecified: Secondary | ICD-10-CM | POA: Diagnosis not present

## 2022-05-29 DIAGNOSIS — I959 Hypotension, unspecified: Secondary | ICD-10-CM | POA: Diagnosis not present

## 2022-05-29 DIAGNOSIS — D72829 Elevated white blood cell count, unspecified: Secondary | ICD-10-CM | POA: Diagnosis not present

## 2022-05-29 DIAGNOSIS — L89153 Pressure ulcer of sacral region, stage 3: Secondary | ICD-10-CM | POA: Diagnosis not present

## 2022-05-29 DIAGNOSIS — K219 Gastro-esophageal reflux disease without esophagitis: Secondary | ICD-10-CM | POA: Diagnosis not present

## 2022-05-29 LAB — PHOSPHORUS: Phosphorus: 2.2 mg/dL — ABNORMAL LOW (ref 2.5–4.6)

## 2022-05-29 LAB — CBC
HCT: 35.9 % — ABNORMAL LOW (ref 36.0–46.0)
Hemoglobin: 11.3 g/dL — ABNORMAL LOW (ref 12.0–15.0)
MCH: 30.3 pg (ref 26.0–34.0)
MCHC: 31.5 g/dL (ref 30.0–36.0)
MCV: 96.2 fL (ref 80.0–100.0)
Platelets: 163 10*3/uL (ref 150–400)
RBC: 3.73 MIL/uL — ABNORMAL LOW (ref 3.87–5.11)
RDW: 14.6 % (ref 11.5–15.5)
WBC: 7.1 10*3/uL (ref 4.0–10.5)
nRBC: 0 % (ref 0.0–0.2)

## 2022-05-29 LAB — BLOOD GAS, ARTERIAL
Acid-Base Excess: 3.2 mmol/L — ABNORMAL HIGH (ref 0.0–2.0)
Bicarbonate: 27.9 mmol/L (ref 20.0–28.0)
FIO2: 21 %
O2 Saturation: 95.2 %
Patient temperature: 37
pCO2 arterial: 42 mmHg (ref 32–48)
pH, Arterial: 7.43 (ref 7.35–7.45)
pO2, Arterial: 66 mmHg — ABNORMAL LOW (ref 83–108)

## 2022-05-29 LAB — BASIC METABOLIC PANEL
Anion gap: 11 (ref 5–15)
BUN: 33 mg/dL — ABNORMAL HIGH (ref 8–23)
CO2: 30 mmol/L (ref 22–32)
Calcium: 8.6 mg/dL — ABNORMAL LOW (ref 8.9–10.3)
Chloride: 98 mmol/L (ref 98–111)
Creatinine, Ser: 6.65 mg/dL — ABNORMAL HIGH (ref 0.44–1.00)
GFR, Estimated: 6 mL/min — ABNORMAL LOW (ref >60)
Glucose, Bld: 100 mg/dL — ABNORMAL HIGH (ref 70–99)
Potassium: 4.1 mmol/L (ref 3.5–5.1)
Sodium: 139 mmol/L (ref 135–145)

## 2022-05-29 LAB — GLUCOSE, CAPILLARY
Glucose-Capillary: 124 mg/dL — ABNORMAL HIGH (ref 70–99)
Glucose-Capillary: 107 mg/dL — ABNORMAL HIGH (ref 70–99)
Glucose-Capillary: 118 mg/dL — ABNORMAL HIGH (ref 70–99)
Glucose-Capillary: 169 mg/dL — ABNORMAL HIGH (ref 70–99)

## 2022-05-29 MED ORDER — DOCUSATE SODIUM 100 MG PO CAPS
200.0000 mg | ORAL_CAPSULE | Freq: Two times a day (BID) | ORAL | Status: DC
  Administered 2022-05-29 – 2022-05-30 (×3): 200 mg via ORAL
  Filled 2022-05-29 (×3): qty 2

## 2022-05-29 MED ORDER — MIDODRINE HCL 5 MG PO TABS
10.0000 mg | ORAL_TABLET | Freq: Every day | ORAL | Status: DC | PRN
  Administered 2022-05-30: 10 mg via ORAL
  Filled 2022-05-29: qty 2

## 2022-05-29 MED ORDER — BISACODYL 10 MG RE SUPP
10.0000 mg | Freq: Every day | RECTAL | Status: DC | PRN
  Administered 2022-05-30: 10 mg via RECTAL
  Filled 2022-05-29: qty 1

## 2022-05-29 MED ORDER — POLYETHYLENE GLYCOL 3350 17 G PO PACK
17.0000 g | PACK | Freq: Every day | ORAL | Status: DC
  Administered 2022-05-29: 17 g via ORAL
  Filled 2022-05-29: qty 1

## 2022-05-29 NOTE — Progress Notes (Addendum)
[facility]  ROUNDING NOTE   Subjective:   Patient seen sitting up in bed Alert and oriented Tolerated dialysis well yesterday States her BP is usually low and she take Midodrine prior to dialysis and halfway through treatment.  Tolerating small meals  Objective:  Vital signs in last 24 hours:  Temp:  [97.6 F (36.4 C)-98.7 F (37.1 C)] 98.7 F (37.1 C) (06/28 0910) Pulse Rate:  [59-65] 63 (06/28 0910) Resp:  [15-24] 18 (06/28 0910) BP: (79-117)/(45-69) 112/46 (06/28 0910) SpO2:  [94 %-100 %] 100 % (06/28 0910) Weight:  [101.6 kg-104.8 kg] 101.6 kg (06/28 0500)  Weight change: 0.05 kg Filed Weights   05/28/22 1320 05/28/22 1710 05/29/22 0500  Weight: 104.8 kg 104.2 kg 101.6 kg    Intake/Output: I/O last 3 completed shifts: In: 440 [P.O.:440] Out: 500 [Other:500]   Intake/Output this shift:  Total I/O In: 120 [P.O.:120] Out: -   Physical Exam: General: NAD  Head: Normocephalic, atraumatic. Moist oral mucosal membranes  Eyes: Anicteric  Lungs:  Clear to auscultation, normal effort  Heart: Regular rate and rhythm  Abdomen:  Soft, nontender, obese  Extremities: No peripheral edema.  Neurologic: Nonfocal, moving all four extremities  Skin: No lesions, bilateral heel wounds  Access: Left upper aVF    Basic Metabolic Panel: Recent Labs  Lab 05/24/22 0853 05/27/22 0934 05/29/22 0902  NA 135 136 139  K 3.6 3.9 4.1  CL 97* 96* 98  CO2 30 29 30   GLUCOSE 95 99 100*  BUN 19 46* 33*  CREATININE 5.75* 8.37* 6.65*  CALCIUM 8.2* 8.3* 8.6*  PHOS 2.8 2.3* 2.2*     Liver Function Tests: Recent Labs  Lab 05/24/22 0853 05/27/22 0934  ALBUMIN 2.8* 2.8*    No results for input(s): "LIPASE", "AMYLASE" in the last 168 hours. No results for input(s): "AMMONIA" in the last 168 hours.  CBC: Recent Labs  Lab 05/24/22 0853 05/27/22 0934 05/29/22 0902  WBC 7.0 8.4 7.1  HGB 11.0* 10.9* 11.3*  HCT 35.1* 34.6* 35.9*  MCV 96.7 96.4 96.2  PLT 164  171 163     Cardiac Enzymes: No results for input(s): "CKTOTAL", "CKMB", "CKMBINDEX", "TROPONINI" in the last 168 hours.  BNP: Invalid input(s): "POCBNP"  CBG: Recent Labs  Lab 05/28/22 0751 05/28/22 1135 05/28/22 1728 05/28/22 2056 05/29/22 0912  GLUCAP 78 121* 94 124* 124*     Microbiology: Results for orders placed or performed during the hospital encounter of 12/06/21  Resp Panel by RT-PCR (Flu A&B, Covid) Nasopharyngeal Swab     Status: Abnormal   Collection Time: 12/06/21  2:16 PM   Specimen: Nasopharyngeal Swab; Nasopharyngeal(NP) swabs in vial transport medium  Result Value Ref Range Status   SARS Coronavirus 2 by RT PCR POSITIVE (A) NEGATIVE Final    Comment: (NOTE) SARS-CoV-2 target nucleic acids are DETECTED.  The SARS-CoV-2 RNA is generally detectable in upper respiratory specimens during the acute phase of infection. Positive results are indicative of the presence of the identified virus, but do not rule out bacterial infection or co-infection with other pathogens not detected by the test. Clinical correlation with patient history and other diagnostic information is necessary to determine patient infection status. The expected result is Negative.  Fact Sheet for Patients: EntrepreneurPulse.com.au  Fact Sheet for Healthcare Providers: IncredibleEmployment.be  This test is not yet approved or cleared by the [place] FDA and  has been authorized for detection and/or diagnosis of SARS-CoV-2 by FDA under an Emergency Use Authorization (EUA).  This EUA will remain in effect (meaning this test can be used) for the duration of  the COVID-19 declaration under Section 564(b)(1) of the A ct, 21 U.S.C. section 360bbb-3(b)(1), unless the authorization is terminated or revoked sooner.     Influenza A by PCR NEGATIVE NEGATIVE Final   Influenza B by PCR NEGATIVE NEGATIVE Final    Comment: (NOTE) The Xpert Xpress  SARS-CoV-2/FLU/RSV plus assay is intended as an aid in the diagnosis of influenza from Nasopharyngeal swab specimens and should not be used as a sole basis for treatment. Nasal washings and aspirates are unacceptable for Xpert Xpress SARS-CoV-2/FLU/RSV testing.  Fact Sheet for Patients: EntrepreneurPulse.com.au  Fact Sheet for Healthcare Providers: IncredibleEmployment.be  This test is not yet approved or cleared by the [place] FDA and has been authorized for detection and/or diagnosis of SARS-CoV-2 by FDA under an Emergency Use Authorization (EUA). This EUA will remain in effect (meaning this test can be used) for the duration of the COVID-19 declaration under Section 564(b)(1) of the Act, 21 U.S.C. section 360bbb-3(b)(1), unless the authorization is terminated or revoked.  Performed at [facility], [place] [address], [place], [place] [address]     Coagulation Studies: No results for input(s): "LABPROT", "INR" in the last 72 hours.  Urinalysis: No results for input(s): "COLORURINE", "LABSPEC", "PHURINE", "GLUCOSEU", "HGBUR", "BILIRUBINUR", "KETONESUR", "PROTEINUR", "UROBILINOGEN", "NITRITE", "LEUKOCYTESUR" in the last 72 hours.  Invalid input(s): "APPERANCEUR"    Imaging: No results found.   Medications:     aspirin EC  81 mg Oral Daily   Chlorhexidine Gluconate Cloth  6 each Topical Q0600   cinacalcet  30 mg Oral Q breakfast   ezetimibe  10 mg Oral Daily   ferric citrate  420 mg Oral BID WC   heparin  5,000 Units Subcutaneous Q8H   midodrine  10 mg Oral Q T,Th,Sa-HD   pantoprazole  40 mg Oral Daily   rosuvastatin  40 mg Oral Daily   acetaminophen, albuterol, ondansetron **OR** ondansetron (ZOFRAN) IV  Assessment/ Plan:  [name] is a 76 y.o.  female with past medical conditions including hyperlipidemia, CAD with stent, arthritis, hypertension, diabetes, GERD, obesity, chronic wounds, end-stage renal  disease on hemodialysis, who was admitted to [facility] on 05/21/2022 for Weakness [R53.1] Troponin I above reference range [R77.8] Wound of sacral region, initial encounter [S31.000A] General weakness [R53.1]  [facility] [place]/TTS/left aVF  Hypotension with chronic kidney disease.Home regimen includes midodrine on dialysis days only.  Blood pressure improved today to 112/46. Will modify order for midodrine to be given prior to dialysis treatment and if needed, halfway through.  2. End-stage renal disease on hemodialysis.  Will maintain outpatient schedule if possible.   Received dialysis yesterday with 0.5L UF achieved. Next treatment scheduled for Thursday, if remains inpatient. Discharge plan to include rehab, appealed approved.   3. 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    4. Secondary Hyperparathyroidism: Lab Results  Component Value Date   PTH 388.6 (H) 01/17/2014   CALCIUM 8.6 (L) 05/29/2022   CAION 1.12 10/16/2011   PHOS 2.2 (L) 05/29/2022    Calcium at goal. Phosphorus remains decreased. I will hold the Auryxia. Cinacalcet ordered daily   LOS: 0 Tien Spooner 6/28/202310:51 AM

## 2022-05-29 NOTE — TOC Progression Note (Addendum)
Transition of Care [facility]) - Progression Note    Patient Details  Name: Jacqueline [name] MRN: [id number] Date of Birth: 1946/08/14  Transition of Care [facility]) CM/SW [place], LCSW Phone Number: 05/29/2022, 9:28 AM  Clinical Narrative:   Received call from [facility] stating that authorization appeal has been approved. Waiting on authorization details to show on [place] portal.  12:48 pm: Auth still pending in the portal.  2:50 pm: Auth still pending.  Expected Discharge Plan: Skilled Nursing Facility Barriers to Discharge: Continued Medical Work up  Expected Discharge Plan and Services Expected Discharge Plan: [place]                                               Social Determinants of Health (SDOH) Interventions    Readmission Risk Interventions    05/22/2022   12:26 PM  Readmission Risk Prevention Plan  Transportation Screening Complete  PCP or Specialist Appt within 3-5 Days Complete  HRI or [place] Complete  Social Work Consult for [place] Planning/Counseling Complete  Palliative Care Screening Complete  Medication Review Press photographer) Complete

## 2022-05-29 NOTE — Progress Notes (Signed)
PROGRESS NOTE    Jacqueline [name]  EVO:[id number] DOB: 04/25/1946 DOA: 05/21/2022 PCP: [name], FNP  Assessment & Plan:   Principal Problem:   Weakness Active Problems:   ESRD (end stage renal disease) on dialysis ([place])   Pressure injury of skin   General weakness  Assessment and Plan: Progressive debility/generalized weakness: with family requesting placement to SNF. Awaiting on insurance auth still    Stage III coccygeal decubitus ulcer:  continue w/ wound care   Hypotension: continue on midodrine. Keep MAP > 65   ESRD: on HD TTS.  Left arm dialysis access malfunction appears to have resolved.    Leukocytosis: resolved    Mild elevated troponins: has chronically elevated troponins likely from ESRD.  Constipation: started on colace, miralax. Dulcolax prn   GERD: continue on PPI   DM2: well controlled, HbA1c 5.9.  DVT prophylaxis: heparin SQ Code Status: full  Family Communication: Disposition Plan: d/c to SNF   Level of care: Med-Surg  Status is: Observation The patient remains OBS appropriate and will d/c before 2 midnights.    Consultants:    Procedures:   Antimicrobials:  Subjective: Pt c/o constipation   Objective: Vitals:   05/28/22 2154 05/28/22 2230 05/29/22 0500 05/29/22 0500  BP: (!) 89/47 (!) 99/54  (!) 117/45  Pulse: 63   65  Resp:    18  Temp: 98.5 F (36.9 C)   98.5 F (36.9 C)  TempSrc: Oral     SpO2: 98%   100%  Weight:   101.6 kg   Height:        Intake/Output Summary (Last 24 hours) at 05/29/2022 0813 Last data filed at 05/28/2022 2150 Gross per 24 hour  Intake 440 ml  Output 500 ml  Net -60 ml   Filed Weights   05/28/22 1320 05/28/22 1710 05/29/22 0500  Weight: 104.8 kg 104.2 kg 101.6 kg    Examination:  General exam: Appears calm and comfortable  Respiratory system: Clear to auscultation. Respiratory effort normal. Cardiovascular system: S1 & S2 +. No rubs, gallops or clicks.  Gastrointestinal system:  Abdomen is nondistended, soft and nontender. Normal bowel sounds heard. Central nervous system: Alert and oriented. Moves all extremities  Psychiatry: Judgement and insight appears at baseline. Flat mood and affect     Data Reviewed: I have personally reviewed following labs and imaging studies  CBC: Recent Labs  Lab 05/24/22 0853 05/27/22 0934  WBC 7.0 8.4  HGB 11.0* 10.9*  HCT 35.1* 34.6*  MCV 96.7 96.4  PLT 164 299   Basic Metabolic Panel: Recent Labs  Lab 05/24/22 0853 05/27/22 0934  NA 135 136  K 3.6 3.9  CL 97* 96*  CO2 30 29  GLUCOSE 95 99  BUN 19 46*  CREATININE 5.75* 8.37*  CALCIUM 8.2* 8.3*  PHOS 2.8 2.3*   GFR: Estimated Creatinine Clearance: 6.4 mL/min (A) (by C-G formula based on SCr of 8.37 mg/dL (H)). Liver Function Tests: Recent Labs  Lab 05/24/22 0853 05/27/22 0934  ALBUMIN 2.8* 2.8*   No results for input(s): "LIPASE", "AMYLASE" in the last 168 hours. No results for input(s): "AMMONIA" in the last 168 hours. Coagulation Profile: No results for input(s): "INR", "PROTIME" in the last 168 hours. Cardiac Enzymes: No results for input(s): "CKTOTAL", "CKMB", "CKMBINDEX", "TROPONINI" in the last 168 hours. BNP (last 3 results) No results for input(s): "PROBNP" in the last 8760 hours. HbA1C: No results for input(s): "HGBA1C" in the last 72 hours. CBG: Recent Labs  Lab  05/27/22 2105 05/28/22 0751 05/28/22 1135 05/28/22 1728 05/28/22 2056  GLUCAP 112* 78 121* 94 124*   Lipid Profile: No results for input(s): "CHOL", "HDL", "LDLCALC", "TRIG", "CHOLHDL", "LDLDIRECT" in the last 72 hours. Thyroid Function Tests: No results for input(s): "TSH", "T4TOTAL", "FREET4", "T3FREE", "THYROIDAB" in the last 72 hours. Anemia Panel: No results for input(s): "VITAMINB12", "FOLATE", "FERRITIN", "TIBC", "IRON", "RETICCTPCT" in the last 72 hours. Sepsis Labs: No results for input(s): "PROCALCITON", "LATICACIDVEN" in the last 168 hours.  No results found  for this or any previous visit (from the past 240 hour(s)).       Radiology Studies: No results found.      Scheduled Meds:  aspirin EC  81 mg Oral Daily   Chlorhexidine Gluconate Cloth  6 each Topical Q0600   cinacalcet  30 mg Oral Q breakfast   ezetimibe  10 mg Oral Daily   ferric citrate  420 mg Oral BID WC   heparin  5,000 Units Subcutaneous Q8H   midodrine  10 mg Oral Q T,Th,Sa-HD   pantoprazole  40 mg Oral Daily   rosuvastatin  40 mg Oral Daily   Continuous Infusions:   LOS: 0 days    Time spent: 25 mins     Wyvonnia Dusky, MD [facility] Pager [phone]  If 7PM-7AM, please contact night-coverage 05/29/2022, 8:13 AM

## 2022-05-29 NOTE — Progress Notes (Signed)
NP notified due to patients blood pressure of 89/47 HR 63. NP ordered midodrine 10mg . Rechecked patients blood pressure it is now 99/54. Patient states that she takes midodrine before dialysis and half thru her dialysis txt to keep her blood pressure from lowering. Midodrine has not been ordered that way please advise.

## 2022-05-29 NOTE — Progress Notes (Signed)
Occupational Therapy Treatment Patient Details Name: Jacqueline [name] MRN: [id number] DOB: 03-17-1946 Today's Date: 05/29/2022   History of present illness Pt is a 76 yo female that presented to the ED due to progression of chronic debility and difficulty for family to care for, as well as wound check. PMH of HTN, DMII,ESRD on HD TTS, Chronic debility , CAD s/p stent, Left knee arthritis, Chronic lower back pain,GERD,Obesity , HLD.   OT comments  Pt seen for OT treatment on this date. Upon arrival to room pt awake and alert, lying upright in bed. Pt reporting 5/10 pain in R knee. Pt deferring standing grooming tasks at this time 2/2 pain, but agreeable to tx focused on ADL t/f. Pt required MIN A/HHA with increased time for supine>sit and MIN A + RW with elevated bed and min vcs for safe hand placement for STS. CGA + RW with min vcs for sequencing and safe hand placement for ADL t/f. MAX A to don/doff shoes while seated. Pt educated on compensatory strategies to improve independence in donning/doffing shoes at home. Pt verbalized understanding of instruction provided. Pt left in recliner with all needs met. Pt making good progress toward goals. Pt continues to benefit from skilled OT services to maximize return to PLOF and minimize risk of future falls, injury. Will continue to follow POC. Discharge recommendation remains appropriate.     Recommendations for follow up therapy are one component of a multi-disciplinary discharge planning process, led by the attending physician.  Recommendations may be updated based on patient status, additional functional criteria and insurance authorization.    Follow Up Recommendations  Skilled nursing-short term rehab (<3 hours/day)    Assistance Recommended at Discharge Frequent or constant Supervision/Assistance  Patient can return home with the following  Help with stairs or ramp for entrance;Assistance with cooking/housework;A little help with walking and/or  transfers;A lot of help with bathing/dressing/bathroom   Equipment Recommendations  Other (comment) (defer to next venue of care)    Recommendations for Other Services      Precautions / Restrictions Precautions Precautions: Fall Restrictions Weight Bearing Restrictions: No       Mobility Bed Mobility Overal bed mobility: Needs Assistance Bed Mobility: Supine to Sit     Supine to sit: Min assist (HHA)          Transfers Overall transfer level: Needs assistance Equipment used: Rolling walker (2 wheels) Transfers: Sit to/from Stand, Bed to chair/wheelchair/BSC Sit to Stand: From elevated surface, Min assist     Step pivot transfers: Min guard           Balance Overall balance assessment: Needs assistance Sitting-balance support: Feet supported Sitting balance-Leahy Scale: Good     Standing balance support: Reliant on assistive device for balance, Bilateral upper extremity supported Standing balance-Leahy Scale: Fair                             ADL either performed or assessed with clinical judgement   ADL Overall ADL's : Needs assistance/impaired                                       General ADL Comments: MAX A for donning/doffing shoes while seated EOB. CGA + RW with min vcs for sequencing and safe hand placement for ADL t/f.      Cognition Arousal/Alertness: Awake/alert Behavior During Therapy: [facility]  for tasks assessed/performed Overall Cognitive Status: Within Functional Limits for tasks assessed                                                     Pertinent Vitals/ Pain       Pain Assessment Pain Assessment: 0-10 Pain Score: 5  Pain Location: R knee Pain Descriptors / Indicators: Discomfort, Grimacing, Guarding Pain Intervention(s): Limited activity within patient's tolerance, Monitored during session, Repositioned   Frequency  Min 2X/week        Progress Toward Goals  OT Goals(current  goals can now be found in the care plan section)  Progress towards OT goals: Progressing toward goals  Acute Rehab OT Goals Patient Stated Goal: to go home OT Goal Formulation: With patient Time For Goal Achievement: 06/05/22 Potential to Achieve Goals: Good ADL Goals Pt Will Transfer to Toilet: with mod assist;regular height toilet;stand pivot transfer Pt Will Perform Tub/Shower Transfer: with mod assist;shower seat Pt/caregiver will Perform Home Exercise Program: Increased ROM;Increased strength  Plan Discharge plan remains appropriate;Frequency remains appropriate       AM-PAC OT "6 Clicks" Daily Activity     Outcome Measure   Help from another person eating meals?: A Little Help from another person taking care of personal grooming?: A Little Help from another person toileting, which includes using toliet, bedpan, or urinal?: A Lot Help from another person bathing (including washing, rinsing, drying)?: A Little Help from another person to put on and taking off regular upper body clothing?: A Little Help from another person to put on and taking off regular lower body clothing?: A Lot 6 Click Score: 16    End of Session Equipment Utilized During Treatment: Gait belt;Rolling walker (2 wheels)  OT Visit Diagnosis: Muscle weakness (generalized) (M62.81);Other abnormalities of gait and mobility (R26.89);Pain   Activity Tolerance Patient tolerated treatment well   Patient Left in chair;with call bell/phone within reach;with chair alarm set;with SCD's reapplied   Nurse Communication          Time: 1040-1108 OT Time Calculation (min): 28 min  Charges: OT General Charges $OT Visit: 1 Visit OT Treatments $Self Care/Home Management : 8-22 mins $Therapeutic Activity: 8-22 mins  [employer], OTDS  [employer] 05/29/2022, 1:15 PM

## 2022-05-30 DIAGNOSIS — M6259 Muscle wasting and atrophy, not elsewhere classified, multiple sites: Secondary | ICD-10-CM | POA: Diagnosis not present

## 2022-05-30 DIAGNOSIS — N2581 Secondary hyperparathyroidism of renal origin: Secondary | ICD-10-CM | POA: Diagnosis not present

## 2022-05-30 DIAGNOSIS — I959 Hypotension, unspecified: Secondary | ICD-10-CM | POA: Diagnosis not present

## 2022-05-30 DIAGNOSIS — R531 Weakness: Secondary | ICD-10-CM | POA: Diagnosis not present

## 2022-05-30 DIAGNOSIS — E1140 Type 2 diabetes mellitus with diabetic neuropathy, unspecified: Secondary | ICD-10-CM | POA: Diagnosis not present

## 2022-05-30 DIAGNOSIS — D631 Anemia in chronic kidney disease: Secondary | ICD-10-CM | POA: Diagnosis not present

## 2022-05-30 DIAGNOSIS — R2681 Unsteadiness on feet: Secondary | ICD-10-CM | POA: Diagnosis not present

## 2022-05-30 DIAGNOSIS — E569 Vitamin deficiency, unspecified: Secondary | ICD-10-CM | POA: Diagnosis not present

## 2022-05-30 DIAGNOSIS — Z992 Dependence on renal dialysis: Secondary | ICD-10-CM | POA: Diagnosis not present

## 2022-05-30 DIAGNOSIS — R1312 Dysphagia, oropharyngeal phase: Secondary | ICD-10-CM | POA: Diagnosis not present

## 2022-05-30 DIAGNOSIS — E785 Hyperlipidemia, unspecified: Secondary | ICD-10-CM | POA: Diagnosis not present

## 2022-05-30 DIAGNOSIS — K5900 Constipation, unspecified: Secondary | ICD-10-CM | POA: Diagnosis not present

## 2022-05-30 DIAGNOSIS — I2510 Atherosclerotic heart disease of native coronary artery without angina pectoris: Secondary | ICD-10-CM | POA: Diagnosis not present

## 2022-05-30 DIAGNOSIS — N186 End stage renal disease: Secondary | ICD-10-CM | POA: Diagnosis not present

## 2022-05-30 DIAGNOSIS — D689 Coagulation defect, unspecified: Secondary | ICD-10-CM | POA: Diagnosis not present

## 2022-05-30 DIAGNOSIS — L89153 Pressure ulcer of sacral region, stage 3: Secondary | ICD-10-CM

## 2022-05-30 DIAGNOSIS — R12 Heartburn: Secondary | ICD-10-CM | POA: Diagnosis not present

## 2022-05-30 DIAGNOSIS — E1122 Type 2 diabetes mellitus with diabetic chronic kidney disease: Secondary | ICD-10-CM | POA: Diagnosis not present

## 2022-05-30 DIAGNOSIS — G8929 Other chronic pain: Secondary | ICD-10-CM | POA: Diagnosis not present

## 2022-05-30 DIAGNOSIS — K219 Gastro-esophageal reflux disease without esophagitis: Secondary | ICD-10-CM | POA: Diagnosis not present

## 2022-05-30 DIAGNOSIS — R498 Other voice and resonance disorders: Secondary | ICD-10-CM | POA: Diagnosis not present

## 2022-05-30 DIAGNOSIS — J99 Respiratory disorders in diseases classified elsewhere: Secondary | ICD-10-CM | POA: Diagnosis not present

## 2022-05-30 DIAGNOSIS — W19XXXA Unspecified fall, initial encounter: Secondary | ICD-10-CM | POA: Diagnosis not present

## 2022-05-30 DIAGNOSIS — H9203 Otalgia, bilateral: Secondary | ICD-10-CM | POA: Diagnosis not present

## 2022-05-30 DIAGNOSIS — I5032 Chronic diastolic (congestive) heart failure: Secondary | ICD-10-CM | POA: Diagnosis not present

## 2022-05-30 DIAGNOSIS — M6281 Muscle weakness (generalized): Secondary | ICD-10-CM | POA: Diagnosis not present

## 2022-05-30 DIAGNOSIS — Z7401 Bed confinement status: Secondary | ICD-10-CM | POA: Diagnosis not present

## 2022-05-30 DIAGNOSIS — R112 Nausea with vomiting, unspecified: Secondary | ICD-10-CM | POA: Diagnosis not present

## 2022-05-30 DIAGNOSIS — I132 Hypertensive heart and chronic kidney disease with heart failure and with stage 5 chronic kidney disease, or end stage renal disease: Secondary | ICD-10-CM | POA: Diagnosis not present

## 2022-05-30 DIAGNOSIS — D72829 Elevated white blood cell count, unspecified: Secondary | ICD-10-CM | POA: Diagnosis not present

## 2022-05-30 DIAGNOSIS — Z79899 Other long term (current) drug therapy: Secondary | ICD-10-CM | POA: Diagnosis not present

## 2022-05-30 DIAGNOSIS — I509 Heart failure, unspecified: Secondary | ICD-10-CM | POA: Diagnosis not present

## 2022-05-30 DIAGNOSIS — Z743 Need for continuous supervision: Secondary | ICD-10-CM | POA: Diagnosis not present

## 2022-05-30 DIAGNOSIS — G629 Polyneuropathy, unspecified: Secondary | ICD-10-CM | POA: Diagnosis not present

## 2022-05-30 DIAGNOSIS — I130 Hypertensive heart and chronic kidney disease with heart failure and stage 1 through stage 4 chronic kidney disease, or unspecified chronic kidney disease: Secondary | ICD-10-CM | POA: Diagnosis not present

## 2022-05-30 DIAGNOSIS — M1712 Unilateral primary osteoarthritis, left knee: Secondary | ICD-10-CM | POA: Diagnosis not present

## 2022-05-30 DIAGNOSIS — E1129 Type 2 diabetes mellitus with other diabetic kidney complication: Secondary | ICD-10-CM | POA: Diagnosis not present

## 2022-05-30 DIAGNOSIS — K2100 Gastro-esophageal reflux disease with esophagitis, without bleeding: Secondary | ICD-10-CM | POA: Diagnosis not present

## 2022-05-30 DIAGNOSIS — R488 Other symbolic dysfunctions: Secondary | ICD-10-CM | POA: Diagnosis not present

## 2022-05-30 LAB — BASIC METABOLIC PANEL
Anion gap: 8 (ref 5–15)
BUN: 47 mg/dL — ABNORMAL HIGH (ref 8–23)
CO2: 30 mmol/L (ref 22–32)
Calcium: 8.1 mg/dL — ABNORMAL LOW (ref 8.9–10.3)
Chloride: 101 mmol/L (ref 98–111)
Creatinine, Ser: 8.21 mg/dL — ABNORMAL HIGH (ref 0.44–1.00)
GFR, Estimated: 5 mL/min — ABNORMAL LOW (ref >60)
Glucose, Bld: 96 mg/dL (ref 70–99)
Potassium: 4.3 mmol/L (ref 3.5–5.1)
Sodium: 139 mmol/L (ref 135–145)

## 2022-05-30 LAB — GLUCOSE, CAPILLARY: Glucose-Capillary: 92 mg/dL (ref 70–99)

## 2022-05-30 MED ORDER — LACTULOSE 10 GM/15ML PO SOLN
20.0000 g | Freq: Two times a day (BID) | ORAL | Status: DC
  Administered 2022-05-30: 20 g via ORAL
  Filled 2022-05-30: qty 30

## 2022-05-30 NOTE — Progress Notes (Addendum)
Patient discharged to [facility] via EMS. Report called to [facility] at [facility]. IV removed.

## 2022-05-30 NOTE — TOC Progression Note (Addendum)
Transition of Care [facility]) - Progression Note    Patient Details  Name: Jacqueline [name] MRN: [id number] Date of Birth: 30-Sep-1946  Transition of Care [facility]) CM/SW [place], LCSW Phone Number: 05/30/2022, 8:28 AM  Clinical Narrative:  Authorization still pending in [place] portal. Called and spoke to pre-service coordinator who said it takes a while to get the official approval because the [place] them the information.   10:03 am: Still pending. Went by room to update patient but she is off the unit.  10:19 am: Updated [facility] RN.  11:48 am: Authorization approved: [id number]. Valid 6/27-6/30. [facility] can accept her today. MD is aware.  Expected Discharge Plan: Skilled Nursing Facility Barriers to Discharge: Continued Medical Work up  Expected Discharge Plan and Services Expected Discharge Plan: [place]                                               Social Determinants of Health (SDOH) Interventions    Readmission Risk Interventions    05/22/2022   12:26 PM  Readmission Risk Prevention Plan  Transportation Screening Complete  PCP or Specialist Appt within 3-5 Days Complete  HRI or [place] Complete  Social Work Consult for [place] Planning/Counseling Complete  Palliative Care Screening Complete  Medication Review Press photographer) Complete

## 2022-05-30 NOTE — Progress Notes (Signed)
[facility]  ROUNDING NOTE   Subjective:   Patient seen and evaluated during dialysis   HEMODIALYSIS FLOWSHEET:  Blood Flow Rate (mL/min): 350 mL/min Arterial Pressure (mmHg): -110 mmHg Venous Pressure (mmHg): 330 mmHg Transmembrane Pressure (mmHg): 70 mmHg Ultrafiltration Rate (mL/min): 350 mL/min Dialysate Flow Rate (mL/min): 600 ml/min Conductivity: 13.6 Conductivity: 13.6 Dialysis Fluid Bolus: Normal Saline Bolus Amount (mL): 50 mL  Denies pain or discomfort Complains of constipation, treated prior to arrival  Objective:  Vital signs in last 24 hours:  Temp:  [97.8 F (36.6 C)-98.5 F (36.9 C)] 98.4 F (36.9 C) (06/29 1019) Pulse Rate:  [57-70] 60 (06/29 1330) Resp:  [13-22] 13 (06/29 1145) BP: (89-133)/(49-82) 97/54 (06/29 1330) SpO2:  [98 %-100 %] 100 % (06/29 1330) Weight:  [107.4 kg] 107.4 kg (06/29 1022)  Weight change: 2.6 kg Filed Weights   05/29/22 0500 05/30/22 0500 05/30/22 1022  Weight: 101.6 kg 107.4 kg 107.4 kg    Intake/Output: I/O last 3 completed shifts: In: 440 [P.O.:440] Out: -    Intake/Output this shift:  No intake/output data recorded.  Physical Exam: General: NAD  Head: Normocephalic, atraumatic. Moist oral mucosal membranes  Eyes: Anicteric  Lungs:  Clear to auscultation, normal effort  Heart: Regular rate and rhythm  Abdomen:  Soft, nontender, obese  Extremities: No peripheral edema.  Neurologic: Nonfocal, moving all four extremities  Skin: No lesions, bilateral heel wounds  Access: Left upper aVF    Basic Metabolic Panel: Recent Labs  Lab 05/24/22 0853 05/27/22 0934 05/29/22 0902 05/30/22 0422  NA 135 136 139 139  K 3.6 3.9 4.1 4.3  CL 97* 96* 98 101  CO2 30 29 30 30   GLUCOSE 95 99 100* 96  BUN 19 46* 33* 47*  CREATININE 5.75* 8.37* 6.65* 8.21*  CALCIUM 8.2* 8.3* 8.6* 8.1*  PHOS 2.8 2.3* 2.2*  --      Liver Function Tests: Recent Labs  Lab 05/24/22 0853 05/27/22 0934  ALBUMIN 2.8* 2.8*     No results for input(s): "LIPASE", "AMYLASE" in the last 168 hours. No results for input(s): "AMMONIA" in the last 168 hours.  CBC: Recent Labs  Lab 05/24/22 0853 05/27/22 0934 05/29/22 0902  WBC 7.0 8.4 7.1  HGB 11.0* 10.9* 11.3*  HCT 35.1* 34.6* 35.9*  MCV 96.7 96.4 96.2  PLT 164 171 163     Cardiac Enzymes: No results for input(s): "CKTOTAL", "CKMB", "CKMBINDEX", "TROPONINI" in the last 168 hours.  BNP: Invalid input(s): "POCBNP"  CBG: Recent Labs  Lab 05/29/22 0912 05/29/22 1209 05/29/22 1652 05/29/22 2021 05/30/22 0852  GLUCAP 124* 107* 118* 169* 92     Microbiology: Results for orders placed or performed during the hospital encounter of 12/06/21  Resp Panel by RT-PCR (Flu A&B, Covid) Nasopharyngeal Swab     Status: Abnormal   Collection Time: 12/06/21  2:16 PM   Specimen: Nasopharyngeal Swab; Nasopharyngeal(NP) swabs in vial transport medium  Result Value Ref Range Status   SARS Coronavirus 2 by RT PCR POSITIVE (A) NEGATIVE Final    Comment: (NOTE) SARS-CoV-2 target nucleic acids are DETECTED.  The SARS-CoV-2 RNA is generally detectable in upper respiratory specimens during the acute phase of infection. Positive results are indicative of the presence of the identified virus, but do not rule out bacterial infection or co-infection with other pathogens not detected by the test. Clinical correlation with patient history and other diagnostic information is necessary to determine patient infection status. The expected result is Negative.  Fact Sheet  for Patients: EntrepreneurPulse.com.au  Fact Sheet for Healthcare Providers: IncredibleEmployment.be  This test is not yet approved or cleared by the [place] FDA and  has been authorized for detection and/or diagnosis of SARS-CoV-2 by FDA under an Emergency Use Authorization (EUA).  This EUA will remain in effect (meaning this test can be used) for the duration  of  the COVID-19 declaration under Section 564(b)(1) of the A ct, 21 U.S.C. section 360bbb-3(b)(1), unless the authorization is terminated or revoked sooner.     Influenza A by PCR NEGATIVE NEGATIVE Final   Influenza B by PCR NEGATIVE NEGATIVE Final    Comment: (NOTE) The Xpert Xpress SARS-CoV-2/FLU/RSV plus assay is intended as an aid in the diagnosis of influenza from Nasopharyngeal swab specimens and should not be used as a sole basis for treatment. Nasal washings and aspirates are unacceptable for Xpert Xpress SARS-CoV-2/FLU/RSV testing.  Fact Sheet for Patients: EntrepreneurPulse.com.au  Fact Sheet for Healthcare Providers: IncredibleEmployment.be  This test is not yet approved or cleared by the [place] FDA and has been authorized for detection and/or diagnosis of SARS-CoV-2 by FDA under an Emergency Use Authorization (EUA). This EUA will remain in effect (meaning this test can be used) for the duration of the COVID-19 declaration under Section 564(b)(1) of the Act, 21 U.S.C. section 360bbb-3(b)(1), unless the authorization is terminated or revoked.  Performed at [facility], [place] [address] [place], [place] [address]     Coagulation Studies: No results for input(s): "LABPROT", "INR" in the last 72 hours.  Urinalysis: No results for input(s): "COLORURINE", "LABSPEC", "PHURINE", "GLUCOSEU", "HGBUR", "BILIRUBINUR", "KETONESUR", "PROTEINUR", "UROBILINOGEN", "NITRITE", "LEUKOCYTESUR" in the last 72 hours.  Invalid input(s): "APPERANCEUR"    Imaging: No results found.   Medications:     aspirin EC  81 mg Oral Daily   Chlorhexidine Gluconate Cloth  6 each Topical Q0600   cinacalcet  30 mg Oral Q breakfast   docusate sodium  200 mg Oral BID   ezetimibe  10 mg Oral Daily   heparin  5,000 Units Subcutaneous Q8H   lactulose  20 g Oral BID   midodrine  10 mg Oral Q T,Th,Sa-HD   pantoprazole  40 mg Oral Daily    rosuvastatin  40 mg Oral Daily   acetaminophen, albuterol, bisacodyl, midodrine, ondansetron **OR** ondansetron (ZOFRAN) IV  Assessment/ Plan:  [name] is a 76 y.o.  female with past medical conditions including hyperlipidemia, CAD with stent, arthritis, hypertension, diabetes, GERD, obesity, chronic wounds, end-stage renal disease on hemodialysis, who was admitted to [facility] on 05/21/2022 for Weakness [R53.1] Troponin I above reference range [R77.8] Wound of sacral region, initial encounter [S31.000A] General weakness [R53.1]  [facility]/TTS/left aVF  Hypotension with chronic kidney disease.Home regimen includes midodrine on dialysis days only.    Blood pressure is stable thus far during treatment.  97/54.  Patient has as needed order for midodrine 10 mg as needed during treatment.  Patient giving morning dose prior to arrival.    2. End-stage renal disease on hemodialysis.  Will maintain outpatient schedule if possible.   Currently receiving dialysis, UF goal 0.5 L as tolerated. Next treatment scheduled for Saturday.  Discharge plan to include local rehab, awaiting insurance authorization.  3. 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  We will continue to monitor.   4. Secondary Hyperparathyroidism: Lab Results  Component Value Date  PTH 388.6 (H) 01/17/2014   CALCIUM 8.1 (L) 05/30/2022   CAION 1.12 10/16/2011   PHOS 2.2 (L) 05/29/2022    We will continue to monitor bone minerals during this admission.  Jacqueline [name] currently held due to decreased phosphorus.  Cinacalcet ordered daily   LOS: 0 Jacqueline [name] 6/29/20231:38 PM

## 2022-05-30 NOTE — Discharge Summary (Signed)
Physician Discharge Summary  [facility]TRUM TXM:[id number] DOB: [birth year] DOA: 05/21/2022  PCP: [name], FNP  Admit date: 05/21/2022 Discharge date: 05/30/2022  Admitted From: home  Disposition: SNF  Recommendations for Outpatient Follow-up:  Follow up with PCP in 1-2 weeks F/u w/ nephro in 1-2 weeks   Home Health: no  Equipment/Devices:  Discharge Condition: stable  CODE STATUS: Diet recommendation: renal diet  Brief/Interim Summary: HPI was taken from Dr. Marcello Moores: Jacqueline [name] is a 76 y.o. female with medical history significant of  HTN, DMII,ESRD on HD TTS, Chronic debility , CAD s/p stent, Left knee arthritis, Chronic lower back pain,GERD,Obesity , HLD, who presents to ED brought in by family due to progression of chronic debility and family inability to take care of patient in addition to wound check.  Patient states she had had difficulty getting around for years however however the last 9 months she has had continued decline and now unable to get up from a seated position without assistance. She states she recently started physical therapy over the last few week but has noted no improvement.  She notes she has no home health aide and her son and daughter in law assist her with all her ADLS. She states she is only able to feed her self. She also note that her husband recently died in 2023/05/03 and he was her primary care giver.  Currently her daughter and son in law work and are unable to be there with her and so she sits in a chair until they return from work. Due to these she has developed pressure ulcer in the sacral area.  ON ros she notes no sob, chest pain , cough /n/v/d/dysuria she does not constipation.       ED Course:  Afeb, bp 110/70, hr 82 , rr 19 sat 98% 2L Abg: ph7.43, po2 66, sat 95.2 poco2 42  Tsh 2.44 Ce 67,67 Bnp 190 NA 138, K 4.2 cr 3.98 Wbc 12.3, hgb 12.6 plt205, pmn 9.6 QMG:NOIBB arrythmia  no hyperacute st-twave changes  CTPA/CTAB CTA of the chest:  No evidence of pulmonary embolism.   Dependent atelectatic changes bilaterally.   CT of the abdomen and pelvis: Fatty infiltration of the liver.   Mild renal atrophy with stable appearing simple cysts. No follow-up is recommended.   No other focal abnormality is seen.  As per Dr. Mal Misty: Jacqueline [name] is a 76 y.o. female  with medical history significant for HTN, type II DM, ESRD on HD TTS, Chronic debility , CAD s/p stent, Left knee arthritis, Chronic lower back pain, GERD, Obesity, hyperlipidemia, who presented to the hospital because of generalized weakness and for wounds on her buttocks.   She was evaluated by PT and OT who recommended further rehabilitation at [facility].  She was seen by the nephrologist for hemodialysis.   As per Dr. Jimmye Norman 6/28-6/29/23: Pt's denial for SNF was reversed and pt was d[facility]c to [facility]. Pt completed HD on the day of d/c and should continue her HD TTS as previously scheduled. For more information, please see previous progress/consult notes.      Discharge Diagnoses:  Principal Problem:   Weakness Active Problems:   ESRD (end stage renal disease) on dialysis (HCC)   Pressure injury of skin   General weakness  Progressive debility/generalized weakness:  PT/OT recs SNF    Stage III coccygeal decubitus ulcer:  continue w/ wound care   Hypotension: continue on midodrine. Keep MAP > 65   ESRD: on HD  TTS.  Left arm dialysis access malfunction appears to have resolved.    Leukocytosis: resolved    Mild elevated troponins: has chronically elevated troponins likely from ESRD.  Constipation: continue on colace, miralax while inpatient. Dulcolax prn   GERD: continue on PPI   DM2: well controlled, HbA1c 5.9.  Morbid obesity: BMI 40.6. Complicates overall care & prognosis   Discharge Instructions  Discharge Instructions     Diet general   Complete by: As directed    W/ renal diet   Discharge instructions   Complete by: As directed    F/u w/ PCP  in 1-2 weeks. F/u w/ nephro in 1-2 weeks   Discharge wound care:   Complete by: As directed    Wound care  Every shift      Comments: Cleanse coccyx wound with NS and pat dry.  Apply silicone foam to conform to dead space and promote healing. Change every three days and PRN soilage.  Cleanse buttocks with soap and water  Barrier cream twice daily and PRN soilage.  No disposable briefs or underpads.  Turn and reposition.   Increase activity slowly   Complete by: As directed       Allergies as of 05/30/2022       Reactions   Other Other (See Comments)   Sulfa Antibiotics Other (See Comments)   Sulfa Drugs Cross Reactors Other (See Comments)   Doesn't remember         Medication List     TAKE these medications    acetaminophen 500 MG tablet Commonly known as: TYLENOL Take 1,000 mg by mouth every 6 (six) hours as needed for moderate pain or headache.   albuterol 108 (90 Base) MCG/ACT inhaler Commonly known as: VENTOLIN HFA Inhale 1-2 puffs into the lungs every 6 (six) hours as needed for wheezing or shortness of breath.   aspirin EC 81 MG tablet Take 1 tablet (81 mg total) by mouth daily.   Auryxia 1 GM 210 MG(Fe) tablet Generic drug: ferric citrate Take 420 mg by mouth 2 (two) times a day.   cinacalcet 30 MG tablet Commonly known as: SENSIPAR Take 30 mg by mouth daily.   Dialyvite/Zinc Tabs Take 1 tablet by mouth daily.   ezetimibe 10 MG tablet Commonly known as: ZETIA Take 1 tablet (10 mg total) by mouth daily.   Fish Oil 1000 MG Cpdr Take 1,000 mg by mouth daily.   HECTOROL IV Dialysis Tuesday,Thursday and saturday   midodrine 10 MG tablet Commonly known as: PROAMATINE Take 10 mg by mouth See admin instructions. Dialysis days [place] Saturday   omeprazole 20 MG capsule Commonly known as: PRILOSEC Take 1 capsule (20 mg total) by mouth daily.   rosuvastatin 40 MG tablet Commonly known as: CRESTOR TAKE 1 TABLET BY MOUTH DAILY   Vitamin D  (Ergocalciferol) 50 MCG (2000 UT) Caps Take 2,000 Units by mouth daily.               Discharge Care Instructions  (From admission, onward)           Start     Ordered   05/30/22 0000  Discharge wound care:       Comments: Wound care  Every shift      Comments: Cleanse coccyx wound with NS and pat dry.  Apply silicone foam to conform to dead space and promote healing. Change every three days and PRN soilage.  Cleanse buttocks with soap and water  Barrier cream twice daily  and PRN soilage.  No disposable briefs or underpads.  Turn and reposition.   05/30/22 1313            Contact information for after-discharge care     Destination     [facility] [place] [facility] Preferred SNF .   Service: Skilled Nursing Contact information: [place] [phone]                    Allergies  Allergen Reactions   Other Other (See Comments)   Sulfa Antibiotics Other (See Comments)   Sulfa Drugs Cross Reactors Other (See Comments)    Doesn't remember     Consultations: Nephro    Procedures/Studies: CT Angio Chest PE W and/or Wo Contrast  Result Date: 05/21/2022 CLINICAL DATA:  Weakness EXAM: CT ANGIOGRAPHY CHEST CT ABDOMEN AND PELVIS WITH CONTRAST TECHNIQUE: Multidetector CT imaging of the chest was performed using the standard protocol during bolus administration of intravenous contrast. Multiplanar CT image reconstructions and MIPs were obtained to evaluate the vascular anatomy. Multidetector CT imaging of the abdomen and pelvis was performed using the standard protocol during bolus administration of intravenous contrast. RADIATION DOSE REDUCTION: This exam was performed according to the departmental dose-optimization program which includes automated exposure control, adjustment of the mA and/or kV according to patient size and/or use of iterative reconstruction technique. CONTRAST:  142mL OMNIPAQUE IOHEXOL 350 MG/ML SOLN  COMPARISON:  02/12/2022, FINDINGS: CTA CHEST FINDINGS Cardiovascular: Atherosclerotic calcifications of the thoracic aorta are noted. No aneurysmal dilatation or definitive dissection is seen. Coronary calcifications are noted. Mild cardiomegaly is seen. The pulmonary artery shows a normal branching pattern. No filling defect to suggest pulmonary embolism is noted. Mediastinum/Nodes: Thoracic inlet is within normal limits. No hilar or mediastinal adenopathy is noted. The esophagus as visualized is within normal limits. Lungs/Pleura: Mild dependent atelectatic changes are noted in the lungs bilaterally. No focal infiltrate or sizable effusion is seen. No parenchymal nodules are noted. Musculoskeletal: Degenerative changes of the thoracic spine are seen. No acute rib abnormality is noted. No compression deformity is seen. Review of the MIP images confirms the above findings. CT ABDOMEN and PELVIS FINDINGS Hepatobiliary: Mild fatty infiltration of the liver is noted. Gallbladder is within normal limits. Pancreas: Unremarkable. No pancreatic ductal dilatation or surrounding inflammatory changes. Spleen: Normal in size without focal abnormality. Adrenals/Urinary Tract: Adrenal glands are unremarkable. Kidneys show a normal enhancement pattern. Mild atrophic changes are noted. Scattered cysts are seen which appear simple in nature. The largest of these measures up to 5.6 cm in the left kidney. These are stable in appearance from prior exam. Bladder is decompressed. Stomach/Bowel: The appendix is within normal limits. No obstructive or inflammatory changes of the colon are seen. Small bowel and stomach show evidence of a small hiatal hernia. Vascular/Lymphatic: Aortic atherosclerosis. No enlarged abdominal or pelvic lymph nodes. Reproductive: Status [name] hysterectomy. No adnexal masses. Other: No abdominal wall hernia or abnormality. No abdominopelvic ascites. Musculoskeletal: No acute bony abnormality is noted.  Degenerative changes of the lumbar spine are seen. There is clinical history of pressure sores in the buttocks bilaterally although no definitive lesion is noted. Review of the MIP images confirms the above findings. IMPRESSION: CTA of the chest: No evidence of pulmonary embolism. Dependent atelectatic changes bilaterally. CT of the abdomen and pelvis: Fatty infiltration of the liver. Mild renal atrophy with stable appearing simple cysts. No follow-up is recommended. No other focal abnormality is seen. Aortic Atherosclerosis (ICD10-I70.0). Electronically Signed  By: Inez Catalina M.D.   On: 05/21/2022 21:36   CT ABDOMEN PELVIS W CONTRAST  Result Date: 05/21/2022 CLINICAL DATA:  Weakness EXAM: CT ANGIOGRAPHY CHEST CT ABDOMEN AND PELVIS WITH CONTRAST TECHNIQUE: Multidetector CT imaging of the chest was performed using the standard protocol during bolus administration of intravenous contrast. Multiplanar CT image reconstructions and MIPs were obtained to evaluate the vascular anatomy. Multidetector CT imaging of the abdomen and pelvis was performed using the standard protocol during bolus administration of intravenous contrast. RADIATION DOSE REDUCTION: This exam was performed according to the departmental dose-optimization program which includes automated exposure control, adjustment of the mA and/or kV according to patient size and/or use of iterative reconstruction technique. CONTRAST:  137mL OMNIPAQUE IOHEXOL 350 MG/ML SOLN COMPARISON:  02/12/2022, FINDINGS: CTA CHEST FINDINGS Cardiovascular: Atherosclerotic calcifications of the thoracic aorta are noted. No aneurysmal dilatation or definitive dissection is seen. Coronary calcifications are noted. Mild cardiomegaly is seen. The pulmonary artery shows a normal branching pattern. No filling defect to suggest pulmonary embolism is noted. Mediastinum/Nodes: Thoracic inlet is within normal limits. No hilar or mediastinal adenopathy is noted. The esophagus as  visualized is within normal limits. Lungs/Pleura: Mild dependent atelectatic changes are noted in the lungs bilaterally. No focal infiltrate or sizable effusion is seen. No parenchymal nodules are noted. Musculoskeletal: Degenerative changes of the thoracic spine are seen. No acute rib abnormality is noted. No compression deformity is seen. Review of the MIP images confirms the above findings. CT ABDOMEN and PELVIS FINDINGS Hepatobiliary: Mild fatty infiltration of the liver is noted. Gallbladder is within normal limits. Pancreas: Unremarkable. No pancreatic ductal dilatation or surrounding inflammatory changes. Spleen: Normal in size without focal abnormality. Adrenals/Urinary Tract: Adrenal glands are unremarkable. Kidneys show a normal enhancement pattern. Mild atrophic changes are noted. Scattered cysts are seen which appear simple in nature. The largest of these measures up to 5.6 cm in the left kidney. These are stable in appearance from prior exam. Bladder is decompressed. Stomach/Bowel: The appendix is within normal limits. No obstructive or inflammatory changes of the colon are seen. Small bowel and stomach show evidence of a small hiatal hernia. Vascular/Lymphatic: Aortic atherosclerosis. No enlarged abdominal or pelvic lymph nodes. Reproductive: Status [name] hysterectomy. No adnexal masses. Other: No abdominal wall hernia or abnormality. No abdominopelvic ascites. Musculoskeletal: No acute bony abnormality is noted. Degenerative changes of the lumbar spine are seen. There is clinical history of pressure sores in the buttocks bilaterally although no definitive lesion is noted. Review of the MIP images confirms the above findings. IMPRESSION: CTA of the chest: No evidence of pulmonary embolism. Dependent atelectatic changes bilaterally. CT of the abdomen and pelvis: Fatty infiltration of the liver. Mild renal atrophy with stable appearing simple cysts. No follow-up is recommended. No other focal abnormality  is seen. Aortic Atherosclerosis (ICD10-I70.0). Electronically Signed   By: Inez Catalina M.D.   On: 05/21/2022 21:36   (Echo, Carotid, EGD, Colonoscopy, ERCP)    Subjective: Pt c/o malaise    Discharge Exam: Vitals:   05/30/22 1215 05/30/22 1230  BP: (!) 111/52   Pulse: 60 61  Resp:    Temp:    SpO2: 100% 100%   Vitals:   05/30/22 1145 05/30/22 1200 05/30/22 1215 05/30/22 1230  BP: (!) 105/53 (!) 97/50 (!) 111/52   Pulse: 62 61 60 61  Resp: 13     Temp:      TempSrc:      SpO2: 100% 100% 100% 100%  Weight:  Height:        General: Pt is alert, awake, not in acute distress Cardiovascular: S1/S2 +, no rubs, no gallops Respiratory: CTA bilaterally, no wheezing, no rhonchi Abdominal: Soft, NT, obese, bowel sounds + Extremities: no cyanosis    The results of significant diagnostics from this hospitalization (including imaging, microbiology, ancillary and laboratory) are listed below for reference.     Microbiology: No results found for this or any previous visit (from the past 240 hour(s)).   Labs: BNP (last 3 results) Recent Labs    12/14/21 1816 02/25/22 1632 05/21/22 1931  BNP 932.4* 157.2* 254.2*   Basic Metabolic Panel: Recent Labs  Lab 05/24/22 0853 05/27/22 0934 05/29/22 0902 05/30/22 0422  NA 135 136 139 139  K 3.6 3.9 4.1 4.3  CL 97* 96* 98 101  CO2 30 29 30 30   GLUCOSE 95 99 100* 96  BUN 19 46* 33* 47*  CREATININE 5.75* 8.37* 6.65* 8.21*  CALCIUM 8.2* 8.3* 8.6* 8.1*  PHOS 2.8 2.3* 2.2*  --    Liver Function Tests: Recent Labs  Lab 05/24/22 0853 05/27/22 0934  ALBUMIN 2.8* 2.8*   No results for input(s): "LIPASE", "AMYLASE" in the last 168 hours. No results for input(s): "AMMONIA" in the last 168 hours. CBC: Recent Labs  Lab 05/24/22 0853 05/27/22 0934 05/29/22 0902  WBC 7.0 8.4 7.1  HGB 11.0* 10.9* 11.3*  HCT 35.1* 34.6* 35.9*  MCV 96.7 96.4 96.2  PLT 164 171 163   Cardiac Enzymes: No results for input(s): "CKTOTAL",  "CKMB", "CKMBINDEX", "TROPONINI" in the last 168 hours. BNP: Invalid input(s): "POCBNP" CBG: Recent Labs  Lab 05/29/22 0912 05/29/22 1209 05/29/22 1652 05/29/22 2021 05/30/22 0852  GLUCAP 124* 107* 118* 169* 92   D-Dimer No results for input(s): "DDIMER" in the last 72 hours. Hgb A1c No results for input(s): "HGBA1C" in the last 72 hours. Lipid Profile No results for input(s): "CHOL", "HDL", "LDLCALC", "TRIG", "CHOLHDL", "LDLDIRECT" in the last 72 hours. Thyroid function studies No results for input(s): "TSH", "T4TOTAL", "T3FREE", "THYROIDAB" in the last 72 hours.  Invalid input(s): "FREET3" Anemia work up No results for input(s): "VITAMINB12", "FOLATE", "FERRITIN", "TIBC", "IRON", "RETICCTPCT" in the last 72 hours. Urinalysis    Component Value Date/Time   COLORURINE YELLOW 01/25/2014 1903   APPEARANCEUR TURBID (A) 01/25/2014 1903   LABSPEC 1.020 01/25/2014 1903   PHURINE 5.0 01/25/2014 1903   GLUCOSEU NEGATIVE 01/25/2014 1903   HGBUR LARGE (A) 01/25/2014 1903   BILIRUBINUR SMALL (A) 01/25/2014 1903   KETONESUR NEGATIVE 01/25/2014 1903   PROTEINUR 100 (A) 01/25/2014 1903   UROBILINOGEN 1.0 01/25/2014 1903   NITRITE NEGATIVE 01/25/2014 1903   LEUKOCYTESUR LARGE (A) 01/25/2014 1903   Sepsis Labs Recent Labs  Lab 05/24/22 0853 05/27/22 0934 05/29/22 0902  WBC 7.0 8.4 7.1   Microbiology No results found for this or any previous visit (from the past 240 hour(s)).   Time coordinating discharge: Over 30 minutes  SIGNED:   Wyvonnia Dusky, MD  [facility] 05/30/2022, 1:14 PM Pager   If 7PM-7AM, please contact night-coverage [email or web address]

## 2022-05-30 NOTE — Progress Notes (Signed)
Physical Therapy Treatment Patient Details Name: Jacqueline [name] MRN: [id number] DOB: 10-07-1946 Today's Date: 05/30/2022   History of Present Illness Pt is a 76 yo female that presented to the ED due to progression of chronic debility and difficulty for family to care for, as well as wound check. PMH of HTN, DMII,ESRD on HD TTS, Chronic debility , CAD s/p stent, Left knee arthritis, Chronic lower back pain,GERD,Obesity , HLD.    PT Comments    Pt was pleasant and motivated to participate during the session and put forth good effort throughout. Session is limited due to arrival of hemodialysis transport mid session. Prior, pt was able to complete bed mobility w/ minA for trunk control and leg management and verbal cuing for sequencing. Pt was able to complete sit to stand w/ modA to initiate stand and very effortful to complete. Pt able to side step to [facility] 27ft using RW w/ CGA in order for better positioning when moving back to supine for dialysis transport. Pt will benefit from PT services in a SNF setting upon discharge to safely address deficits listed in patient problem list for decreased caregiver assistance and eventual return to PLOF.    Recommendations for follow up therapy are one component of a multi-disciplinary discharge planning process, led by the attending physician.  Recommendations may be updated based on patient status, additional functional criteria and insurance authorization.  Follow Up Recommendations  Skilled nursing-short term rehab (<3 hours/day) Can patient physically be transported by private vehicle: Yes   Assistance Recommended at Discharge Intermittent Supervision/Assistance  Patient can return home with the following A little help with walking and/or transfers;A little help with bathing/dressing/bathroom;Assistance with cooking/housework;Assist for transportation;Help with stairs or ramp for entrance   Equipment Recommendations  Rolling walker (2 wheels);BSC/3in1     Recommendations for Other Services       Precautions / Restrictions Precautions Precautions: Fall Restrictions Weight Bearing Restrictions: No     Mobility  Bed Mobility Overal bed mobility: Needs Assistance Bed Mobility: Supine to Sit, Sit to Supine     Supine to sit: Min assist Sit to supine: Min assist   General bed mobility comments: verbal cuing for sequencing and minA for trunk control and leg management    Transfers Overall transfer level: Needs assistance Equipment used: Rolling walker (2 wheels) Transfers: Sit to/from Stand Sit to Stand: Mod assist           General transfer comment: modA to initiate stand    Ambulation/Gait Ambulation/Gait assistance: Min guard Gait Distance (Feet): 3 Feet Assistive device: Rolling walker (2 wheels) Gait Pattern/deviations: Step-to pattern, Decreased step length - right, Decreased step length - left Gait velocity: decreased     General Gait Details: pt able to side step to [facility]; no LOB oberserved   Marine scientist Rankin (Stroke Patients Only)       Balance                                            Cognition Arousal/Alertness: Awake/alert Behavior During Therapy: WFL for tasks assessed/performed Overall Cognitive Status: Within Functional Limits for tasks assessed  Exercises General Exercises - Lower Extremity Ankle Circles/Pumps: Strengthening, Both, 10 reps Quad Sets: Strengthening, Both, 10 reps Long Arc Quad: Strengthening, Both, 10 reps    General Comments        Pertinent Vitals/Pain Pain Assessment Pain Assessment: 0-10 Pain Score: 5  Pain Location: RLE Pain Descriptors / Indicators: Discomfort, Grimacing, Guarding Pain Intervention(s): Monitored during session, RN gave pain meds during session, Repositioned    Home Living                           Prior Function            PT Goals (current goals can now be found in the care plan section) Progress towards PT goals: Progressing toward goals    Frequency    Min 2X/week      PT Plan Current plan remains appropriate    Co-evaluation              AM-PAC PT "6 Clicks" Mobility   Outcome Measure  Help needed turning from your back to your side while in a flat bed without using bedrails?: A Little Help needed moving from lying on your back to sitting on the side of a flat bed without using bedrails?: A Little Help needed moving to and from a bed to a chair (including a wheelchair)?: A Little Help needed standing up from a chair using your arms (e.g., wheelchair or bedside chair)?: A Little Help needed to walk in hospital room?: A Little Help needed climbing 3-5 steps with a railing? : A Lot 6 Click Score: 17    End of Session Equipment Utilized During Treatment: Gait belt Activity Tolerance: Other (comment) (Session limited due to hemodialysis transport arriving) Patient left: in bed;Other (comment) (with dialysis transport) Nurse Communication: Mobility status PT Visit Diagnosis: Other abnormalities of gait and mobility (R26.89);Difficulty in walking, not elsewhere classified (R26.2);Muscle weakness (generalized) (M62.81)     Time: 2992-4268 PT Time Calculation (min) (ACUTE ONLY): 16 min  Charges:                       Turner Daniels, SPT  05/30/2022, 11:28 AM

## 2022-05-30 NOTE — TOC Transition Note (Signed)
Transition of Care [facility]) - CM/SW Discharge Note   Patient Details  Name: Jacqueline [name] MRN: [id number] Date of Birth: 1946/08/26  Transition of Care [facility]) CM/SW Contact:  Candie Chroman, LCSW Phone Number: 05/30/2022, 2:59 PM   Clinical Narrative: Patient has orders to discharge to [facility] today. RN will call report to [phone] (Room 708). EMS transport has been arranged. There are a few ahead of her on the list. Left voicemail for son [name], no voicemail available for son Darin. Patient will try to call them. She was on the phone with her sister so she notified her of plan for discharge today. No further concerns. CSW signing off.  Final next level of care: Skilled Nursing Facility Barriers to Discharge: Barriers Resolved   Patient Goals and CMS Choice     Choice offered to / list presented to : Patient  Discharge Placement   Existing PASRR number confirmed : 05/23/22          Patient chooses bed at: [facility] [place] Patient to be transferred to facility by: EMS Name of family member notified: Unable to reach sons. Patient notified her sister. Patient and family notified of of transfer: 05/30/22  Discharge Plan and Services                                     Social Determinants of Health (SDOH) Interventions     Readmission Risk Interventions    05/22/2022   12:26 PM  Readmission Risk Prevention Plan  Transportation Screening Complete  PCP or Specialist Appt within 3-5 Days Complete  HRI or [place] Complete  Social Work Consult for [place] Planning/Counseling Complete  Palliative Care Screening Complete  Medication Review Press photographer) Complete

## 2022-05-31 DIAGNOSIS — N186 End stage renal disease: Secondary | ICD-10-CM | POA: Diagnosis not present

## 2022-05-31 DIAGNOSIS — E1129 Type 2 diabetes mellitus with other diabetic kidney complication: Secondary | ICD-10-CM | POA: Diagnosis not present

## 2022-05-31 DIAGNOSIS — Z992 Dependence on renal dialysis: Secondary | ICD-10-CM | POA: Diagnosis not present

## 2022-05-31 DIAGNOSIS — I959 Hypotension, unspecified: Secondary | ICD-10-CM | POA: Diagnosis not present

## 2022-05-31 DIAGNOSIS — L89153 Pressure ulcer of sacral region, stage 3: Secondary | ICD-10-CM | POA: Diagnosis not present

## 2022-05-31 DIAGNOSIS — M6281 Muscle weakness (generalized): Secondary | ICD-10-CM | POA: Diagnosis not present

## 2022-06-01 DIAGNOSIS — D689 Coagulation defect, unspecified: Secondary | ICD-10-CM | POA: Diagnosis not present

## 2022-06-01 DIAGNOSIS — N2581 Secondary hyperparathyroidism of renal origin: Secondary | ICD-10-CM | POA: Diagnosis not present

## 2022-06-01 DIAGNOSIS — N186 End stage renal disease: Secondary | ICD-10-CM | POA: Diagnosis not present

## 2022-06-01 DIAGNOSIS — Z992 Dependence on renal dialysis: Secondary | ICD-10-CM | POA: Diagnosis not present

## 2022-06-04 DIAGNOSIS — N2581 Secondary hyperparathyroidism of renal origin: Secondary | ICD-10-CM | POA: Diagnosis not present

## 2022-06-04 DIAGNOSIS — Z992 Dependence on renal dialysis: Secondary | ICD-10-CM | POA: Diagnosis not present

## 2022-06-04 DIAGNOSIS — D689 Coagulation defect, unspecified: Secondary | ICD-10-CM | POA: Diagnosis not present

## 2022-06-04 DIAGNOSIS — N186 End stage renal disease: Secondary | ICD-10-CM | POA: Diagnosis not present

## 2022-06-05 ENCOUNTER — Telehealth: Payer: Medicare Other

## 2022-06-05 ENCOUNTER — Ambulatory Visit: Payer: Self-pay

## 2022-06-05 NOTE — Chronic Care Management (AMB) (Signed)
Care Management    RN Visit Note  06/05/2022 Name: Jacqueline [name] MRN: [id number] DOB: [birth year]  Subjective: Jacqueline [name] is a 76 y.o. year old female who is a primary care patient of [name], [place]. The care management team was consulted for assistance with disease management and care coordination needs.    Engaged with patient by telephone for follow up visit in response to provider referral for case management and/or care coordination services.   Consent to Services:   [name] was given information about Care Management services today including:  Care Management services includes personalized support from designated clinical staff supervised by her physician, including individualized plan of care and coordination with other care providers 24/7 contact phone numbers for assistance for urgent and routine care needs. The patient may stop case management services at any time by phone call to the office staff.  Patient agreed to services and consent obtained.   Assessment: Review of patient past medical history, allergies, medications, health status, including review of consultants reports, laboratory and other test data, was performed as part of comprehensive evaluation and provision of chronic care management services.   SDOH (Social Determinants of Health) assessments and interventions performed:    Care Plan  Allergies  Allergen Reactions   Other Other (See Comments)   Sulfa Antibiotics Other (See Comments)   Sulfa Drugs Cross Reactors Other (See Comments)    Doesn't remember     Outpatient Encounter Medications as of 06/05/2022  Medication Sig   acetaminophen (TYLENOL) 500 MG tablet Take 1,000 mg by mouth every 6 (six) hours as needed for moderate pain or headache.   albuterol (VENTOLIN HFA) 108 (90 Base) MCG/ACT inhaler Inhale 1-2 puffs into the lungs every 6 (six) hours as needed for wheezing or shortness of breath.   aspirin EC 81 MG EC tablet Take 1  tablet (81 mg total) by mouth daily.   AURYXIA 1 GM 210 MG(Fe) tablet Take 420 mg by mouth 2 (two) times a day.   B Complex-C-Zn-Folic Acid (DIALYVITE/ZINC) TABS Take 1 tablet by mouth daily.   cinacalcet (SENSIPAR) 30 MG tablet Take 30 mg by mouth daily.   Doxercalciferol (HECTOROL IV) Dialysis Tuesday,Thursday and saturday   ezetimibe (ZETIA) 10 MG tablet Take 1 tablet (10 mg total) by mouth daily.   midodrine (PROAMATINE) 10 MG tablet Take 10 mg by mouth See admin instructions. Dialysis days [facility] Saturday   Omega-3 Fatty Acids (FISH OIL) 1000 MG CPDR Take 1,000 mg by mouth daily.  (Patient not taking: Reported on 04/26/2022)   omeprazole (PRILOSEC) 20 MG capsule Take 1 capsule (20 mg total) by mouth daily.   rosuvastatin (CRESTOR) 40 MG tablet TAKE 1 TABLET BY MOUTH DAILY   Vitamin D, Ergocalciferol, 50 MCG (2000 UT) CAPS Take 2,000 Units by mouth daily.   No facility-administered encounter medications on file as of 06/05/2022.    Patient Active Problem List   Diagnosis Date Noted   Pressure injury of skin 05/22/2022   General weakness 05/22/2022   Weakness 05/21/2022   Anaphylactic shock, unspecified, initial encounter 06/29/2020   Sepsis ([place]) 06/22/2019   Pain due to onychomycosis of toenails of both feet 06/11/2019   Callus 06/11/2019   Other disorders of phosphorus metabolism 05/20/2019   Mixed hyperlipidemia 03/22/2019   Otalgia of both ears 03/22/2019   Right arm pain 03/22/2019   Bradycardia 10/01/2017   Dyslipidemia 10/01/2017   Dependence on renal dialysis ([place]) 09/01/2017   Chest pain, unspecified  12/06/2016   Non-ST elevation (NSTEMI) myocardial infarction ([place]) 12/06/2016   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   History of NSTEMI    CAD S/P percutaneous coronary angioplasty 12/03/2016   Hypokalemia 05/03/2016   Pruritus, unspecified 03/27/2015   Encounter for removal of sutures 11/30/2014   Pain in lower limb 08/26/2014   Encounter for immunization  06/10/2014   Unspecified protein-calorie malnutrition ([place]) 02/28/2014   Allergy, unspecified, initial encounter 02/15/2014   HCAP (healthcare-associated pneumonia) 02/13/2014   Absence of both cervix and uterus, acquired 02/01/2014   Coagulation defect, unspecified ([place]) 02/01/2014   Dyspnea, unspecified 02/01/2014   Family history of diabetes mellitus 02/01/2014   Family history of ischemic heart disease and other diseases of the circulatory system 02/01/2014   Fever, unspecified 02/01/2014   Headache, unspecified 02/01/2014   History of allergy to sulfonamides 02/01/2014   Iron deficiency anemia, unspecified 02/01/2014   Long term (current) use of insulin ([place]) 02/01/2014   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   OSA (obstructive sleep apnea) 01/21/2014   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[place]) 01/21/2014   Acute encephalopathy 01/19/2014   Congestive heart failure ([place]) 01/15/2014   ESRD (end stage renal disease) on dialysis ([place]) 01/15/2014   Hypertension 01/15/2014   Diabetes mellitus ([place]) 01/15/2014   Anemia 01/15/2014   Onychomycosis 03/26/2013   Pain in joint, ankle and foot 03/26/2013    Conditions to be addressed/monitored:  Type 2 diabetes mellitus with end-stage renal disease, 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  Care Plan : RN Care Manager Plan of Care  Updates made by Lynne Logan, RN since 06/05/2022 12:00 AM     Problem: No plan of care established for management of chronic disease states (Type 2 diabetes mellitus with end-stage renal disease, 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)   Priority: High     Long-Range Goal: Establishment of plan of care for management of chronic disease states (Type 2 diabetes mellitus with end-stage renal disease, 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)   Start Date: 04/03/2022  Expected End Date: 04/04/2023  Recent Progress: On  track  Priority: High  Note:   Current Barriers:  Knowledge Deficits related to plan of care for management of Type 2 diabetes mellitus with end-stage renal disease, 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   Care Coordination needs related to Level of care concerns, Inability to perform ADL's independently, and Inability to perform IADL's independently Chronic Disease Management support and education needs related to Type 2 diabetes mellitus with end-stage renal disease, 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    RNCM Clinical Goal(s):  Patient will continue to work with RN Care Manager to address care management and care coordination needs related to  Type 2 diabetes mellitus with end-stage renal disease, 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   Interventions: 1:1 collaboration with primary care provider regarding development and update of comprehensive plan of care as evidenced by provider attestation  and co-signature Inter-disciplinary care team collaboration (see longitudinal plan of care) Evaluation of current treatment plan related to  self management and patient's adherence to plan as established by provider  Interdisciplinary Collaboration Interventions:  (Status: Goal on track:  Yes.) Short Term Goal   Collaborated with BSW to initiate plan of care to address needs related to Level of care  concerns, Inability to perform ADL's independently, and Inability to perform IADL's independently in patient with  Type 2 diabetes mellitus with end-stage renal disease, 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  Collaboration with [name], FNP regarding development and update of comprehensive plan of care as evidenced by provider attestation and co-signature Inter-disciplinary care team collaboration   Wound Interventions:  (Status:  Goal on track:  NO.)  Long Term Goal Evaluation of current treatment plan related to  Pressure Ulcer to sacrum , self-management and patient's adherence to plan as established by provider Determined patient has an open wound to her sacrum secondary to pressure from prolonged sitting due to immobility Discussed her daughter in law is performing wound care, this wound has not been assessed by a physician Discussed needing PCP evaluation of wound in order to treat, patient declines at this time due to stating she is in [place] and her family cannot get to the [place] office Discussed patient's son plans to take her to the [facility] ED for evaluation of this wound  Determined patient is receiving in home PT via [facility], a SNV has not been ordered Educated patient on importance of shifting weight, avoiding friction when changing positions and using pillows for support to help relieve pressure from skin Educated on importance of keeping wound clean and dry Collaborated with PCP [name] FNP providing patient wound status update, requested a SNV be ordered via [facility]  Further collaboration with PCP and embedded BSW Daneen Schick concerning patient's inability to transport to MD appointments and lack of caregiver, determined she may benefit from transitioning primary care to [facility] additional outbound call to patient and spoke with son [name] regarding level of care  concerns Determined son Lester Kinsman is working with [place] regarding Medicaid and placement, unfortunately he has learned his mother's income exceeds the eligible amount for Medicaid  Discussed son [name] learning patient will not qualify for the [facility] program due to she is unable to get herself to the door for pick up via [facility] for adult daycare services Educated son regarding [facility] and PCP recommendations for patient to transition to this service in order to get the medical attention needed Discussed with son [name], following up with the embedded SW Daneen Schick to explore other caregiver options, [name] would like to contact [facility]ie Rung when ready and is agreeable to having PCP place the Remote [facility] referral   Sent in basket message to [name] FNP and [place] regarding referral for [facility], advised Tillie Rung son Lester Kinsman was provided her contact information and will call her when ready to discuss other caregiver options, determined PCP will send the Remote [facility] referral  Discussed plans with patient for ongoing care management follow up and provided patient/son with direct contact information for care management team pending transition to [facility] Admission/transfer to SNF secondary to infected sacral wound with the following noted;  Admit date: 05/21/2022 Discharge date: 05/30/2022 Admitted From: home  Disposition: SNF  Discharge Diagnoses:  Principal Problem:   Weakness Active  Problems:   ESRD (end stage renal disease) on dialysis ([place])   Pressure injury of skin   General weakness Progressive debility/generalized weakness:  PT/OT recs SNF    Stage III coccygeal decubitus ulcer:  continue w/ wound care   Hypotension: continue on midodrine. Keep MAP > 65   ESRD: on HD TTS.  Left arm dialysis access malfunction appears to have resolved.    Leukocytosis: resolved    Mild elevated troponins: has chronically elevated  troponins likely from ESRD.   Constipation: continue on colace, miralax while inpatient. Dulcolax prn    GERD: continue on PPI    DM2: well controlled, HbA1c 5.9.   Morbid obesity: BMI 40.6. Complicates overall care & prognosis    Discharge Instructions   05/30/22 0000   Discharge wound care:       Comments: Wound care  Every shift      Comments: Cleanse coccyx wound with NS and pat dry.  Apply silicone foam to conform to dead space and promote healing. Change every three days and PRN soilage.  Cleanse buttocks with soap and water  Barrier cream twice daily and PRN soilage.  No disposable briefs or underpads.  Turn and reposition.   05/30/22 1313           Contact information for after-discharge care      Destination       [facility] Preferred SNF .   Service: Skilled Nursing Contact information: [address] [place] [phone]       Placed outbound call to [facility] [phone], confirmed patient is currently inpatient at this facility for acute rehab   Patient Goals/Self-Care Activities: Take all medications as prescribed Attend all scheduled provider appointments Call pharmacy for medication refills 3-7 days in advance of running out of medications Perform all self care activities independently  Call provider office for new concerns or questions  check feet daily for cuts, sores or redness manage portion size wash and dry feet carefully every day check blood pressure 3 times per week report new symptoms to your doctor take all medications exactly as prescribed  Follow Up Plan: RN CM will continue to monitor for patient discharge and will outreach to patient upon her discharge home     Barb Merino, RN, BSN, CCM Care Management Coordinator [facility]  Direct Phone: [phone]

## 2022-06-06 DIAGNOSIS — N2581 Secondary hyperparathyroidism of renal origin: Secondary | ICD-10-CM | POA: Diagnosis not present

## 2022-06-06 DIAGNOSIS — Z992 Dependence on renal dialysis: Secondary | ICD-10-CM | POA: Diagnosis not present

## 2022-06-06 DIAGNOSIS — N186 End stage renal disease: Secondary | ICD-10-CM | POA: Diagnosis not present

## 2022-06-06 DIAGNOSIS — D689 Coagulation defect, unspecified: Secondary | ICD-10-CM | POA: Diagnosis not present

## 2022-06-07 DIAGNOSIS — L89153 Pressure ulcer of sacral region, stage 3: Secondary | ICD-10-CM | POA: Diagnosis not present

## 2022-06-07 DIAGNOSIS — N186 End stage renal disease: Secondary | ICD-10-CM | POA: Diagnosis not present

## 2022-06-07 DIAGNOSIS — I959 Hypotension, unspecified: Secondary | ICD-10-CM | POA: Diagnosis not present

## 2022-06-07 DIAGNOSIS — G629 Polyneuropathy, unspecified: Secondary | ICD-10-CM | POA: Diagnosis not present

## 2022-06-08 DIAGNOSIS — Z992 Dependence on renal dialysis: Secondary | ICD-10-CM | POA: Diagnosis not present

## 2022-06-08 DIAGNOSIS — N186 End stage renal disease: Secondary | ICD-10-CM | POA: Diagnosis not present

## 2022-06-08 DIAGNOSIS — D689 Coagulation defect, unspecified: Secondary | ICD-10-CM | POA: Diagnosis not present

## 2022-06-08 DIAGNOSIS — N2581 Secondary hyperparathyroidism of renal origin: Secondary | ICD-10-CM | POA: Diagnosis not present

## 2022-06-11 DIAGNOSIS — I959 Hypotension, unspecified: Secondary | ICD-10-CM | POA: Diagnosis not present

## 2022-06-11 DIAGNOSIS — E1140 Type 2 diabetes mellitus with diabetic neuropathy, unspecified: Secondary | ICD-10-CM | POA: Diagnosis not present

## 2022-06-11 DIAGNOSIS — N2581 Secondary hyperparathyroidism of renal origin: Secondary | ICD-10-CM | POA: Diagnosis not present

## 2022-06-11 DIAGNOSIS — M6281 Muscle weakness (generalized): Secondary | ICD-10-CM | POA: Diagnosis not present

## 2022-06-11 DIAGNOSIS — Z992 Dependence on renal dialysis: Secondary | ICD-10-CM | POA: Diagnosis not present

## 2022-06-11 DIAGNOSIS — D689 Coagulation defect, unspecified: Secondary | ICD-10-CM | POA: Diagnosis not present

## 2022-06-11 DIAGNOSIS — N186 End stage renal disease: Secondary | ICD-10-CM | POA: Diagnosis not present

## 2022-06-12 DIAGNOSIS — N186 End stage renal disease: Secondary | ICD-10-CM | POA: Diagnosis not present

## 2022-06-12 DIAGNOSIS — I959 Hypotension, unspecified: Secondary | ICD-10-CM | POA: Diagnosis not present

## 2022-06-13 ENCOUNTER — Ambulatory Visit: Payer: Self-pay

## 2022-06-13 DIAGNOSIS — E1122 Type 2 diabetes mellitus with diabetic chronic kidney disease: Secondary | ICD-10-CM

## 2022-06-13 DIAGNOSIS — I5032 Chronic diastolic (congestive) heart failure: Secondary | ICD-10-CM

## 2022-06-13 DIAGNOSIS — D689 Coagulation defect, unspecified: Secondary | ICD-10-CM | POA: Diagnosis not present

## 2022-06-13 DIAGNOSIS — N2581 Secondary hyperparathyroidism of renal origin: Secondary | ICD-10-CM | POA: Diagnosis not present

## 2022-06-13 DIAGNOSIS — N186 End stage renal disease: Secondary | ICD-10-CM | POA: Diagnosis not present

## 2022-06-13 DIAGNOSIS — Z992 Dependence on renal dialysis: Secondary | ICD-10-CM | POA: Diagnosis not present

## 2022-06-13 NOTE — Chronic Care Management (AMB) (Signed)
Social Work Note - Discipline Closure  06/13/2022 Name: Jacqueline [name] MRN: [id number] DOB: 1946/08/17  Jacqueline [name] is a 76 y.o. year old female who is a primary care patient of [name], [place].  The Care Management team was consulted for assistance with chronic disease management and care coordination needs.  [name] was given information about Care Management services today including:  Care Management services include personalized support from designated clinical staff supervised by her physician, including individualized plan of care and coordination with other care providers 24/7 contact phone numbers for assistance for urgent and routine care needs. The patient may stop care management services at any time (effective at the end of the month) by phone call to the office staff.  Patient agreed to services and consent obtained.   Collaboration with RN Care Manager  for  case discussion  in response to provider referral for social work chronic care management and care coordination services.  Assessment: Review of patient past medical history, allergies, medications, and health status, including review of pertinent consultant reports was performed as part of comprehensive evaluation and provision of care management/care coordination services.   SDOH (Social Determinants of Health) assessments and interventions performed:  No    Advanced Directives Status: Not addressed in this encounter.  Care Plan  Allergies  Allergen Reactions   Other Other (See Comments)   Sulfa Antibiotics Other (See Comments)   Sulfa Drugs Cross Reactors Other (See Comments)    Doesn't remember     Outpatient Encounter Medications as of 06/13/2022  Medication Sig   acetaminophen (TYLENOL) 500 MG tablet Take 1,000 mg by mouth every 6 (six) hours as needed for moderate pain or headache.   albuterol (VENTOLIN HFA) 108 (90 Base) MCG/ACT inhaler Inhale 1-2 puffs into the lungs every 6 (six) hours  as needed for wheezing or shortness of breath.   aspirin EC 81 MG EC tablet Take 1 tablet (81 mg total) by mouth daily.   AURYXIA 1 GM 210 MG(Fe) tablet Take 420 mg by mouth 2 (two) times a day.   B Complex-C-Zn-Folic Acid (DIALYVITE/ZINC) TABS Take 1 tablet by mouth daily.   cinacalcet (SENSIPAR) 30 MG tablet Take 30 mg by mouth daily.   Doxercalciferol (HECTOROL IV) Dialysis Tuesday,Thursday and saturday   ezetimibe (ZETIA) 10 MG tablet Take 1 tablet (10 mg total) by mouth daily.   midodrine (PROAMATINE) 10 MG tablet Take 10 mg by mouth See admin instructions. Dialysis days [facility] Saturday   Omega-3 Fatty Acids (FISH OIL) 1000 MG CPDR Take 1,000 mg by mouth daily.  (Patient not taking: Reported on 04/26/2022)   omeprazole (PRILOSEC) 20 MG capsule Take 1 capsule (20 mg total) by mouth daily.   rosuvastatin (CRESTOR) 40 MG tablet TAKE 1 TABLET BY MOUTH DAILY   Vitamin D, Ergocalciferol, 50 MCG (2000 UT) CAPS Take 2,000 Units by mouth daily.   No facility-administered encounter medications on file as of 06/13/2022.    Patient Active Problem List   Diagnosis Date Noted   Pressure injury of skin 05/22/2022   General weakness 05/22/2022   Weakness 05/21/2022   Anaphylactic shock, unspecified, initial encounter 06/29/2020   Sepsis ([place]) 06/22/2019   Pain due to onychomycosis of toenails of both feet 06/11/2019   Callus 06/11/2019   Other disorders of phosphorus metabolism 05/20/2019   Mixed hyperlipidemia 03/22/2019   Otalgia of both ears 03/22/2019   Right arm pain 03/22/2019   Bradycardia 10/01/2017   Dyslipidemia 10/01/2017  Dependence on renal dialysis ([place]) 09/01/2017   Chest pain, unspecified 12/06/2016   Non-ST elevation (NSTEMI) myocardial infarction ([place]) 12/06/2016   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   History of NSTEMI    CAD S/P percutaneous coronary angioplasty 12/03/2016   Hypokalemia 05/03/2016   Pruritus, unspecified 03/27/2015   Encounter for removal of  sutures 11/30/2014   Pain in lower limb 08/26/2014   Encounter for immunization 06/10/2014   Unspecified protein-calorie malnutrition ([place]) 02/28/2014   Allergy, unspecified, initial encounter 02/15/2014   HCAP (healthcare-associated pneumonia) 02/13/2014   Absence of both cervix and uterus, acquired 02/01/2014   Coagulation defect, unspecified ([place]) 02/01/2014   Dyspnea, unspecified 02/01/2014   Family history of diabetes mellitus 02/01/2014   Family history of ischemic heart disease and other diseases of the circulatory system 02/01/2014   Fever, unspecified 02/01/2014   Headache, unspecified 02/01/2014   History of allergy to sulfonamides 02/01/2014   Iron deficiency anemia, unspecified 02/01/2014   Long term (current) use of insulin ([place]) 02/01/2014   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[place]) 01/28/2014   OSA (obstructive sleep apnea) 01/21/2014   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[place]) 01/21/2014   Acute encephalopathy 01/19/2014   Congestive heart failure ([place]) 01/15/2014   ESRD (end stage renal disease) on dialysis ([place]) 01/15/2014   Hypertension 01/15/2014   Diabetes mellitus ([place]) 01/15/2014   Anemia 01/15/2014   Onychomycosis 03/26/2013   Pain in joint, ankle and foot 03/26/2013    Conditions to be addressed/monitored: DMII and ESRD; Level of care concerns  Care Plan : Social Work Plan of Care  Updates made by Daneen Schick since 06/13/2022 12:00 AM  Completed 06/13/2022   Problem: Long-Term Care Planning Resolved 06/13/2022     Goal: Effective Long-Term Care Planning Completed 06/13/2022  Start Date: 04/03/2022  Priority: High  Note:   Current Barriers:  Chronic disease management support and education needs related to DM and ESRD  Level of care concerns  Social Worker Clinical Goal(s):  patient will work with SW to identify and address any acute and/or chronic care coordination needs related to the self health management of DM and ESRD  patient will work with SW  to address concerns related to level of care SW Interventions:  Inter-disciplinary care team collaboration (see longitudinal plan of care) Collaboration with [name], FNP regarding development and update of comprehensive plan of care as evidenced by provider attestation and co-signature Collaboration with RN Care Manager who indicates patients son is working with DSS regarding placement and will contact SW as needed Goal closed  Patient Goals/Self-Care Activities patient will: with the help of her son [name]  Midwife primary care provider as needed to address medical concerns -Apply for long term care Medicaid if desired at local DSS         Follow Up Plan:  No SW follow up planned. Patients family is actively engaged with DSS to obtain placement.      Daneen Schick, BSW, CDP Social Worker, Certified Dementia Practitioner [facility] [phone]

## 2022-06-15 DIAGNOSIS — D689 Coagulation defect, unspecified: Secondary | ICD-10-CM | POA: Diagnosis not present

## 2022-06-15 DIAGNOSIS — Z992 Dependence on renal dialysis: Secondary | ICD-10-CM | POA: Diagnosis not present

## 2022-06-15 DIAGNOSIS — N2581 Secondary hyperparathyroidism of renal origin: Secondary | ICD-10-CM | POA: Diagnosis not present

## 2022-06-15 DIAGNOSIS — N186 End stage renal disease: Secondary | ICD-10-CM | POA: Diagnosis not present

## 2022-06-17 ENCOUNTER — Telehealth: Payer: Self-pay

## 2022-06-17 DIAGNOSIS — M6281 Muscle weakness (generalized): Secondary | ICD-10-CM | POA: Diagnosis not present

## 2022-06-17 DIAGNOSIS — N186 End stage renal disease: Secondary | ICD-10-CM | POA: Diagnosis not present

## 2022-06-17 DIAGNOSIS — I959 Hypotension, unspecified: Secondary | ICD-10-CM | POA: Diagnosis not present

## 2022-06-17 NOTE — Chronic Care Management (AMB) (Signed)
06-17-2022: Left patient a VM to reschedule missed initial appointment with Orlando Penner.  06-21-2022:  Left patient a VM to reschedule missed initial appointment with Orlando Penner.  06-25-2022: Left patient a VM to reschedule missed initial appointment with Orlando Penner.  [place] Pharmacist Assistant [phone]

## 2022-06-18 DIAGNOSIS — N2581 Secondary hyperparathyroidism of renal origin: Secondary | ICD-10-CM | POA: Diagnosis not present

## 2022-06-18 DIAGNOSIS — D689 Coagulation defect, unspecified: Secondary | ICD-10-CM | POA: Diagnosis not present

## 2022-06-18 DIAGNOSIS — N186 End stage renal disease: Secondary | ICD-10-CM | POA: Diagnosis not present

## 2022-06-18 DIAGNOSIS — Z992 Dependence on renal dialysis: Secondary | ICD-10-CM | POA: Diagnosis not present

## 2022-06-20 DIAGNOSIS — N2581 Secondary hyperparathyroidism of renal origin: Secondary | ICD-10-CM | POA: Diagnosis not present

## 2022-06-20 DIAGNOSIS — N186 End stage renal disease: Secondary | ICD-10-CM | POA: Diagnosis not present

## 2022-06-20 DIAGNOSIS — Z992 Dependence on renal dialysis: Secondary | ICD-10-CM | POA: Diagnosis not present

## 2022-06-20 DIAGNOSIS — D689 Coagulation defect, unspecified: Secondary | ICD-10-CM | POA: Diagnosis not present

## 2022-06-21 DIAGNOSIS — I959 Hypotension, unspecified: Secondary | ICD-10-CM | POA: Diagnosis not present

## 2022-06-21 DIAGNOSIS — R112 Nausea with vomiting, unspecified: Secondary | ICD-10-CM | POA: Diagnosis not present

## 2022-06-21 DIAGNOSIS — R12 Heartburn: Secondary | ICD-10-CM | POA: Diagnosis not present

## 2022-06-22 DIAGNOSIS — N2581 Secondary hyperparathyroidism of renal origin: Secondary | ICD-10-CM | POA: Diagnosis not present

## 2022-06-22 DIAGNOSIS — D689 Coagulation defect, unspecified: Secondary | ICD-10-CM | POA: Diagnosis not present

## 2022-06-22 DIAGNOSIS — Z992 Dependence on renal dialysis: Secondary | ICD-10-CM | POA: Diagnosis not present

## 2022-06-22 DIAGNOSIS — N186 End stage renal disease: Secondary | ICD-10-CM | POA: Diagnosis not present

## 2022-06-24 DIAGNOSIS — R12 Heartburn: Secondary | ICD-10-CM | POA: Diagnosis not present

## 2022-06-24 DIAGNOSIS — R112 Nausea with vomiting, unspecified: Secondary | ICD-10-CM | POA: Diagnosis not present

## 2022-06-25 DIAGNOSIS — D689 Coagulation defect, unspecified: Secondary | ICD-10-CM | POA: Diagnosis not present

## 2022-06-25 DIAGNOSIS — N2581 Secondary hyperparathyroidism of renal origin: Secondary | ICD-10-CM | POA: Diagnosis not present

## 2022-06-25 DIAGNOSIS — Z992 Dependence on renal dialysis: Secondary | ICD-10-CM | POA: Diagnosis not present

## 2022-06-25 DIAGNOSIS — N186 End stage renal disease: Secondary | ICD-10-CM | POA: Diagnosis not present

## 2022-06-27 ENCOUNTER — Other Ambulatory Visit (INDEPENDENT_AMBULATORY_CARE_PROVIDER_SITE_OTHER): Payer: Medicare Other | Admitting: Nurse Practitioner

## 2022-06-27 DIAGNOSIS — I5032 Chronic diastolic (congestive) heart failure: Secondary | ICD-10-CM | POA: Diagnosis not present

## 2022-06-27 DIAGNOSIS — M1712 Unilateral primary osteoarthritis, left knee: Secondary | ICD-10-CM | POA: Diagnosis not present

## 2022-06-27 DIAGNOSIS — E1122 Type 2 diabetes mellitus with diabetic chronic kidney disease: Secondary | ICD-10-CM | POA: Diagnosis not present

## 2022-06-27 DIAGNOSIS — H9203 Otalgia, bilateral: Secondary | ICD-10-CM

## 2022-06-27 DIAGNOSIS — N186 End stage renal disease: Secondary | ICD-10-CM

## 2022-06-27 DIAGNOSIS — Z992 Dependence on renal dialysis: Secondary | ICD-10-CM

## 2022-06-27 DIAGNOSIS — I130 Hypertensive heart and chronic kidney disease with heart failure and stage 1 through stage 4 chronic kidney disease, or unspecified chronic kidney disease: Secondary | ICD-10-CM

## 2022-06-27 DIAGNOSIS — G8929 Other chronic pain: Secondary | ICD-10-CM

## 2022-06-27 DIAGNOSIS — I2510 Atherosclerotic heart disease of native coronary artery without angina pectoris: Secondary | ICD-10-CM

## 2022-06-27 DIAGNOSIS — D689 Coagulation defect, unspecified: Secondary | ICD-10-CM | POA: Diagnosis not present

## 2022-06-27 DIAGNOSIS — N2581 Secondary hyperparathyroidism of renal origin: Secondary | ICD-10-CM | POA: Diagnosis not present

## 2022-06-27 NOTE — Progress Notes (Signed)
Chief Complaint  Patient presents with   home health recertification   Received home health orders orders from [facility]. Start of care 03/15/2022.   Certification and orders from 05/14/2022 through 07/12/2022 are reviewed, signed and faxed back to home health company.  Need of intermittent skilled services at home: SN and OT  The home health care plan has been established by me and will be reviewed and updated as needed to maximize patient recovery.  I certify that all home health services have been and will be furnished to the patient while under my care.  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

## 2022-06-29 DIAGNOSIS — N186 End stage renal disease: Secondary | ICD-10-CM | POA: Diagnosis not present

## 2022-06-29 DIAGNOSIS — D689 Coagulation defect, unspecified: Secondary | ICD-10-CM | POA: Diagnosis not present

## 2022-06-29 DIAGNOSIS — N2581 Secondary hyperparathyroidism of renal origin: Secondary | ICD-10-CM | POA: Diagnosis not present

## 2022-06-29 DIAGNOSIS — Z992 Dependence on renal dialysis: Secondary | ICD-10-CM | POA: Diagnosis not present

## 2022-07-01 DIAGNOSIS — N186 End stage renal disease: Secondary | ICD-10-CM | POA: Diagnosis not present

## 2022-07-01 DIAGNOSIS — Z992 Dependence on renal dialysis: Secondary | ICD-10-CM | POA: Diagnosis not present

## 2022-07-01 DIAGNOSIS — E1129 Type 2 diabetes mellitus with other diabetic kidney complication: Secondary | ICD-10-CM | POA: Diagnosis not present

## 2022-07-02 DIAGNOSIS — N2581 Secondary hyperparathyroidism of renal origin: Secondary | ICD-10-CM | POA: Diagnosis not present

## 2022-07-02 DIAGNOSIS — N186 End stage renal disease: Secondary | ICD-10-CM | POA: Diagnosis not present

## 2022-07-02 DIAGNOSIS — Z992 Dependence on renal dialysis: Secondary | ICD-10-CM | POA: Diagnosis not present

## 2022-07-02 DIAGNOSIS — D689 Coagulation defect, unspecified: Secondary | ICD-10-CM | POA: Diagnosis not present

## 2022-07-03 ENCOUNTER — Ambulatory Visit: Payer: Medicare Other

## 2022-07-03 ENCOUNTER — Ambulatory Visit: Payer: Medicare Other | Admitting: Nurse Practitioner

## 2022-07-04 DIAGNOSIS — Z992 Dependence on renal dialysis: Secondary | ICD-10-CM | POA: Diagnosis not present

## 2022-07-04 DIAGNOSIS — N186 End stage renal disease: Secondary | ICD-10-CM | POA: Diagnosis not present

## 2022-07-04 DIAGNOSIS — D689 Coagulation defect, unspecified: Secondary | ICD-10-CM | POA: Diagnosis not present

## 2022-07-04 DIAGNOSIS — N2581 Secondary hyperparathyroidism of renal origin: Secondary | ICD-10-CM | POA: Diagnosis not present

## 2022-07-05 DIAGNOSIS — N2581 Secondary hyperparathyroidism of renal origin: Secondary | ICD-10-CM | POA: Diagnosis not present

## 2022-07-05 DIAGNOSIS — D689 Coagulation defect, unspecified: Secondary | ICD-10-CM | POA: Diagnosis not present

## 2022-07-05 DIAGNOSIS — Z992 Dependence on renal dialysis: Secondary | ICD-10-CM | POA: Diagnosis not present

## 2022-07-05 DIAGNOSIS — I959 Hypotension, unspecified: Secondary | ICD-10-CM | POA: Diagnosis not present

## 2022-07-05 DIAGNOSIS — N186 End stage renal disease: Secondary | ICD-10-CM | POA: Diagnosis not present

## 2022-07-05 DIAGNOSIS — R2681 Unsteadiness on feet: Secondary | ICD-10-CM | POA: Diagnosis not present

## 2022-07-05 DIAGNOSIS — I2510 Atherosclerotic heart disease of native coronary artery without angina pectoris: Secondary | ICD-10-CM | POA: Diagnosis not present

## 2022-07-05 DIAGNOSIS — K2100 Gastro-esophageal reflux disease with esophagitis, without bleeding: Secondary | ICD-10-CM | POA: Diagnosis not present

## 2022-07-05 DIAGNOSIS — E569 Vitamin deficiency, unspecified: Secondary | ICD-10-CM | POA: Diagnosis not present

## 2022-07-05 DIAGNOSIS — J99 Respiratory disorders in diseases classified elsewhere: Secondary | ICD-10-CM | POA: Diagnosis not present

## 2022-07-05 DIAGNOSIS — I132 Hypertensive heart and chronic kidney disease with heart failure and with stage 5 chronic kidney disease, or end stage renal disease: Secondary | ICD-10-CM | POA: Diagnosis not present

## 2022-07-05 DIAGNOSIS — E785 Hyperlipidemia, unspecified: Secondary | ICD-10-CM | POA: Diagnosis not present

## 2022-07-05 DIAGNOSIS — Z741 Need for assistance with personal care: Secondary | ICD-10-CM | POA: Diagnosis not present

## 2022-07-08 ENCOUNTER — Telehealth: Payer: Self-pay | Admitting: Nurse Practitioner

## 2022-07-08 DIAGNOSIS — K2100 Gastro-esophageal reflux disease with esophagitis, without bleeding: Secondary | ICD-10-CM | POA: Diagnosis not present

## 2022-07-08 DIAGNOSIS — I132 Hypertensive heart and chronic kidney disease with heart failure and with stage 5 chronic kidney disease, or end stage renal disease: Secondary | ICD-10-CM | POA: Diagnosis not present

## 2022-07-08 DIAGNOSIS — N186 End stage renal disease: Secondary | ICD-10-CM | POA: Diagnosis not present

## 2022-07-08 DIAGNOSIS — J99 Respiratory disorders in diseases classified elsewhere: Secondary | ICD-10-CM | POA: Diagnosis not present

## 2022-07-08 DIAGNOSIS — I2510 Atherosclerotic heart disease of native coronary artery without angina pectoris: Secondary | ICD-10-CM | POA: Diagnosis not present

## 2022-07-08 DIAGNOSIS — E569 Vitamin deficiency, unspecified: Secondary | ICD-10-CM | POA: Diagnosis not present

## 2022-07-08 DIAGNOSIS — R2681 Unsteadiness on feet: Secondary | ICD-10-CM | POA: Diagnosis not present

## 2022-07-08 DIAGNOSIS — I959 Hypotension, unspecified: Secondary | ICD-10-CM | POA: Diagnosis not present

## 2022-07-08 DIAGNOSIS — Z741 Need for assistance with personal care: Secondary | ICD-10-CM | POA: Diagnosis not present

## 2022-07-08 DIAGNOSIS — E785 Hyperlipidemia, unspecified: Secondary | ICD-10-CM | POA: Diagnosis not present

## 2022-07-08 NOTE — Telephone Encounter (Signed)
Left message for patient to call back and schedule Medicare Annual Wellness Visit (AWV) either virtually or in office.  Left both my jabber number [phone] and office number    Last AWV 06/27/21 ; please schedule at anytime with [facility]

## 2022-07-09 DIAGNOSIS — I132 Hypertensive heart and chronic kidney disease with heart failure and with stage 5 chronic kidney disease, or end stage renal disease: Secondary | ICD-10-CM | POA: Diagnosis not present

## 2022-07-09 DIAGNOSIS — E569 Vitamin deficiency, unspecified: Secondary | ICD-10-CM | POA: Diagnosis not present

## 2022-07-09 DIAGNOSIS — D689 Coagulation defect, unspecified: Secondary | ICD-10-CM | POA: Diagnosis not present

## 2022-07-09 DIAGNOSIS — E785 Hyperlipidemia, unspecified: Secondary | ICD-10-CM | POA: Diagnosis not present

## 2022-07-09 DIAGNOSIS — N2581 Secondary hyperparathyroidism of renal origin: Secondary | ICD-10-CM | POA: Diagnosis not present

## 2022-07-09 DIAGNOSIS — I959 Hypotension, unspecified: Secondary | ICD-10-CM | POA: Diagnosis not present

## 2022-07-09 DIAGNOSIS — R2681 Unsteadiness on feet: Secondary | ICD-10-CM | POA: Diagnosis not present

## 2022-07-09 DIAGNOSIS — I2510 Atherosclerotic heart disease of native coronary artery without angina pectoris: Secondary | ICD-10-CM | POA: Diagnosis not present

## 2022-07-09 DIAGNOSIS — Z741 Need for assistance with personal care: Secondary | ICD-10-CM | POA: Diagnosis not present

## 2022-07-09 DIAGNOSIS — K219 Gastro-esophageal reflux disease without esophagitis: Secondary | ICD-10-CM | POA: Diagnosis not present

## 2022-07-09 DIAGNOSIS — J99 Respiratory disorders in diseases classified elsewhere: Secondary | ICD-10-CM | POA: Diagnosis not present

## 2022-07-09 DIAGNOSIS — Z992 Dependence on renal dialysis: Secondary | ICD-10-CM | POA: Diagnosis not present

## 2022-07-09 DIAGNOSIS — M6281 Muscle weakness (generalized): Secondary | ICD-10-CM | POA: Diagnosis not present

## 2022-07-09 DIAGNOSIS — K2100 Gastro-esophageal reflux disease with esophagitis, without bleeding: Secondary | ICD-10-CM | POA: Diagnosis not present

## 2022-07-09 DIAGNOSIS — N186 End stage renal disease: Secondary | ICD-10-CM | POA: Diagnosis not present

## 2022-07-10 ENCOUNTER — Ambulatory Visit: Payer: Self-pay

## 2022-07-10 DIAGNOSIS — I132 Hypertensive heart and chronic kidney disease with heart failure and with stage 5 chronic kidney disease, or end stage renal disease: Secondary | ICD-10-CM | POA: Diagnosis not present

## 2022-07-10 DIAGNOSIS — E569 Vitamin deficiency, unspecified: Secondary | ICD-10-CM | POA: Diagnosis not present

## 2022-07-10 DIAGNOSIS — I959 Hypotension, unspecified: Secondary | ICD-10-CM | POA: Diagnosis not present

## 2022-07-10 DIAGNOSIS — J99 Respiratory disorders in diseases classified elsewhere: Secondary | ICD-10-CM | POA: Diagnosis not present

## 2022-07-10 DIAGNOSIS — K2100 Gastro-esophageal reflux disease with esophagitis, without bleeding: Secondary | ICD-10-CM | POA: Diagnosis not present

## 2022-07-10 DIAGNOSIS — E785 Hyperlipidemia, unspecified: Secondary | ICD-10-CM | POA: Diagnosis not present

## 2022-07-10 DIAGNOSIS — Z741 Need for assistance with personal care: Secondary | ICD-10-CM | POA: Diagnosis not present

## 2022-07-10 DIAGNOSIS — N186 End stage renal disease: Secondary | ICD-10-CM | POA: Diagnosis not present

## 2022-07-10 DIAGNOSIS — I2510 Atherosclerotic heart disease of native coronary artery without angina pectoris: Secondary | ICD-10-CM | POA: Diagnosis not present

## 2022-07-10 DIAGNOSIS — R2681 Unsteadiness on feet: Secondary | ICD-10-CM | POA: Diagnosis not present

## 2022-07-10 IMAGING — CT CT ANGIO CHEST
2 of 7 series · 15 of 46 positions shown · IV contrast (APPLIED)
Comparison: 02/12/2022,

CLINICAL DATA: Weakness



[Series 4: thins · axial · 0.71mm/px · z∈[-726,-501]mm · 12 of 359 slices shown]
[im 20/359  lung]
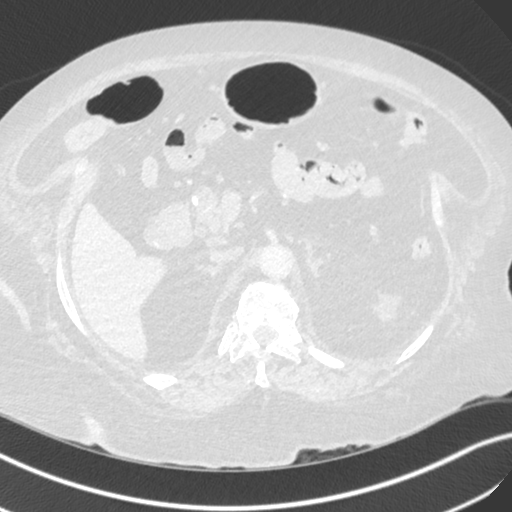
[im 60/359  soft-tissue]
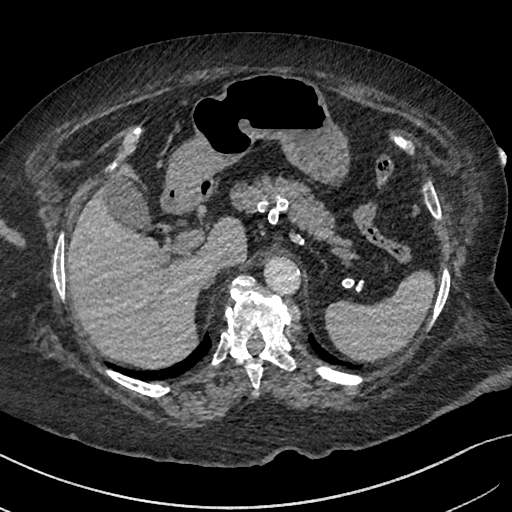
[im 80/359  lung]
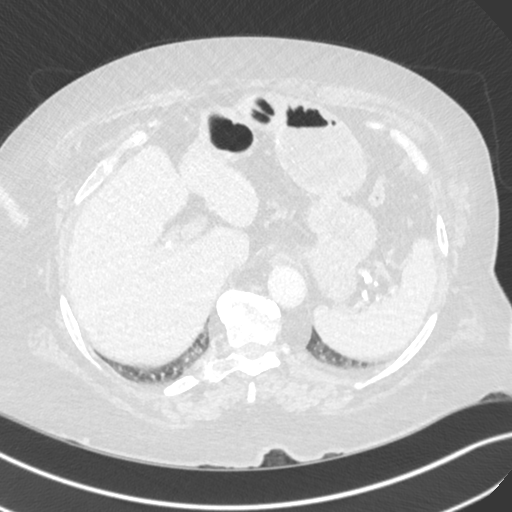
[im 100/359  soft-tissue]
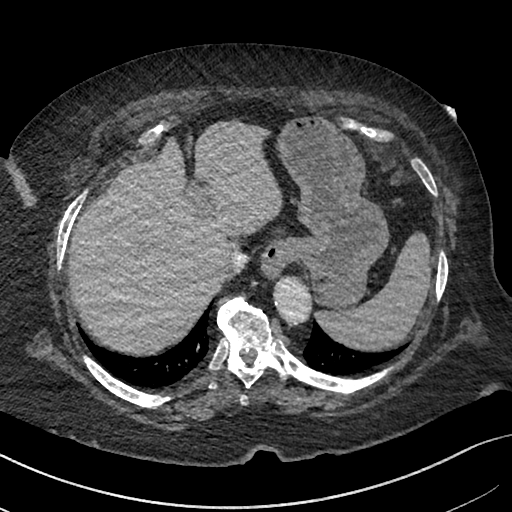
[im 140/359  lung]
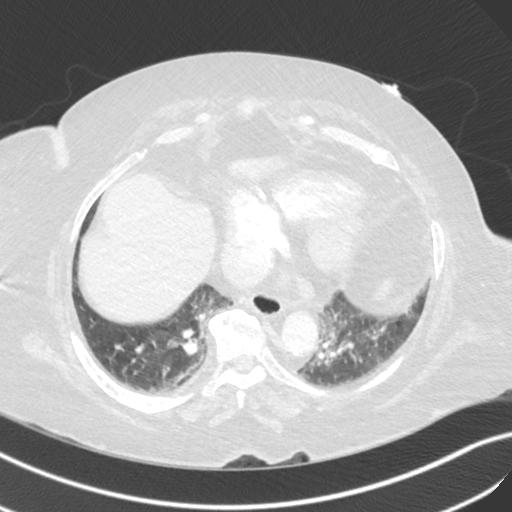
[im 160/359  soft-tissue]
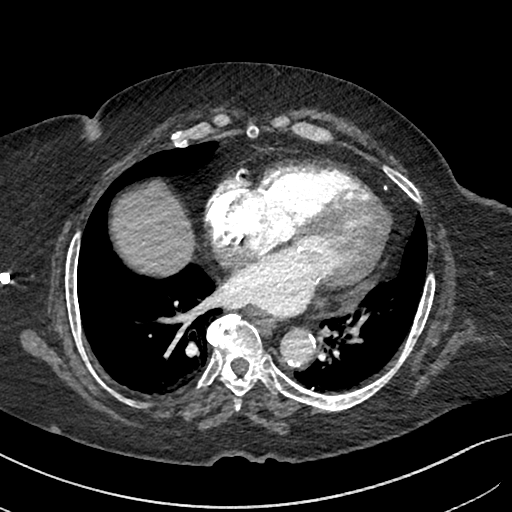
[im 199/359  lung]
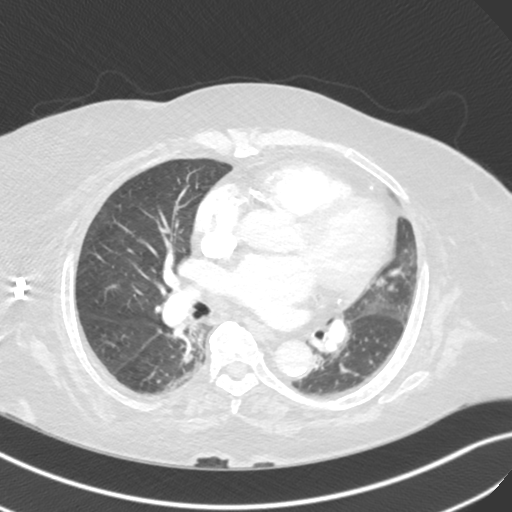
[im 219/359  soft-tissue]
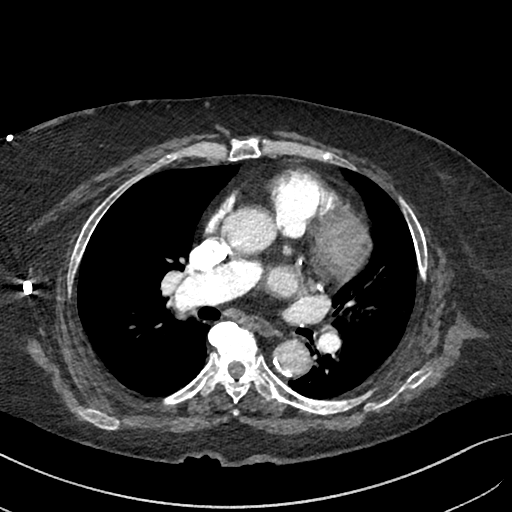
[im 259/359  lung]
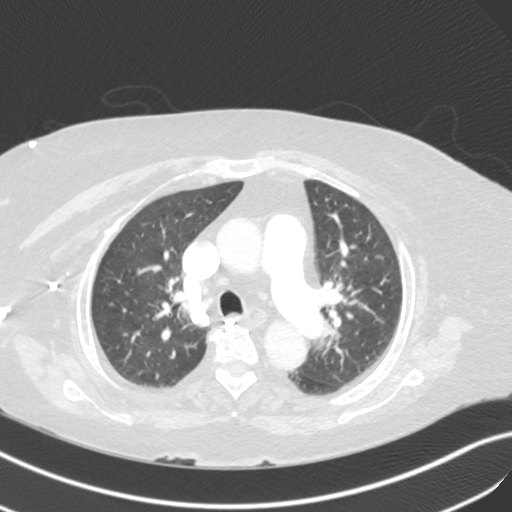
[im 279/359  soft-tissue]
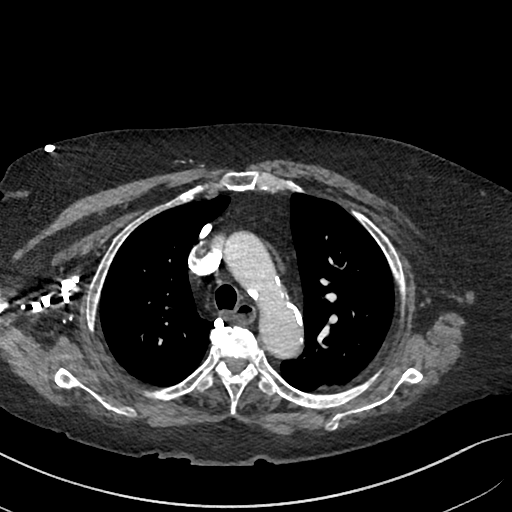
[im 299/359  lung]
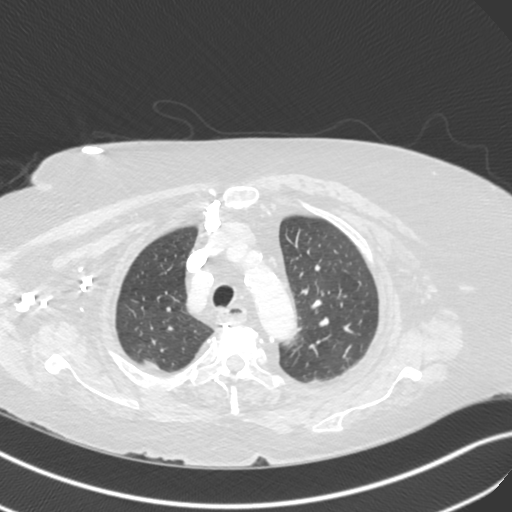
[im 339/359  soft-tissue]
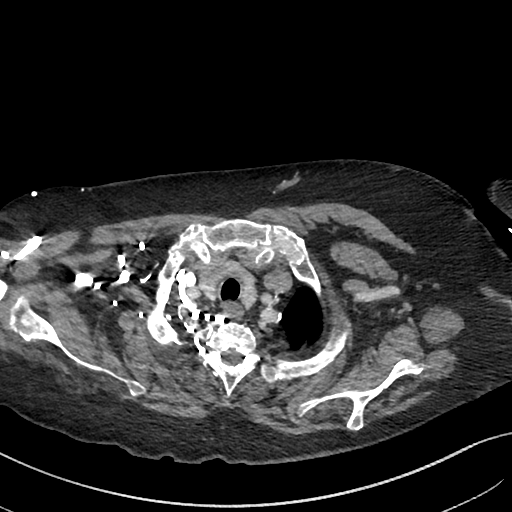

[Series 5: cor · coronal · 0.55mm/px · 3 of 122 slices shown]
[im 31/122  soft-tissue]
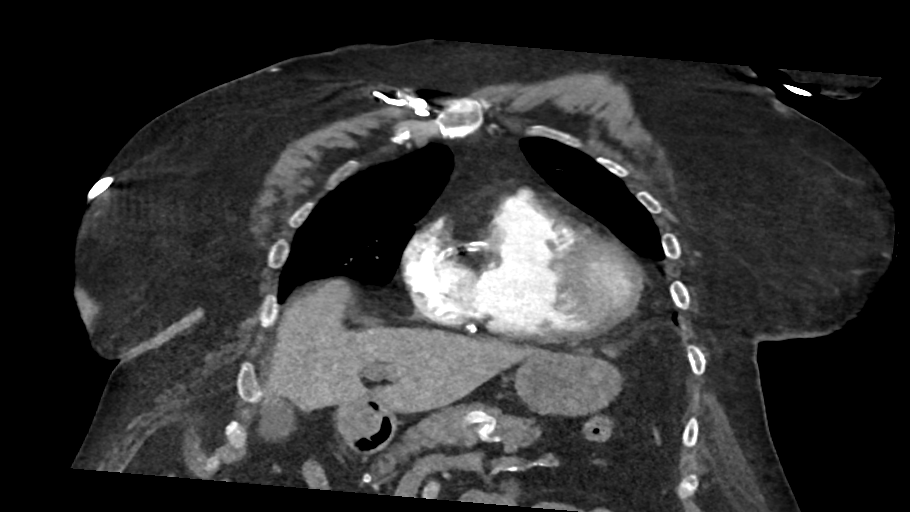
[im 61/122  soft-tissue]
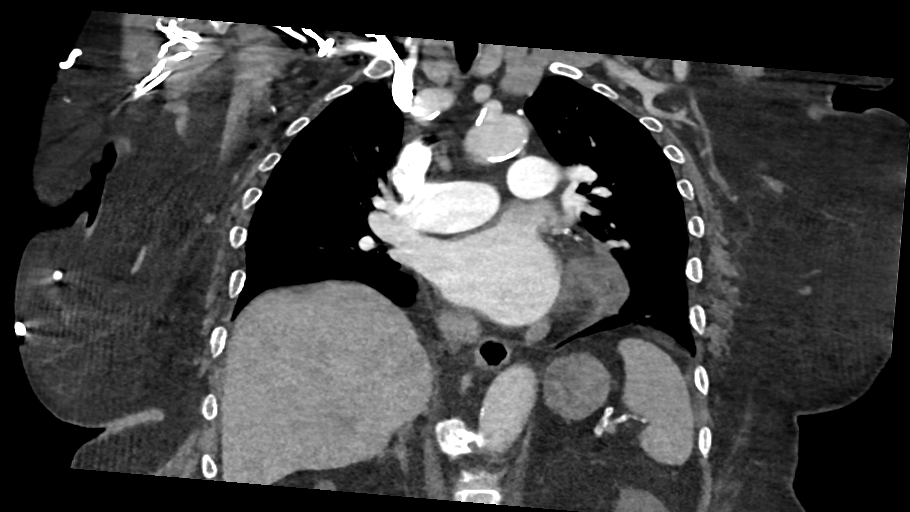
[im 91/122  soft-tissue]
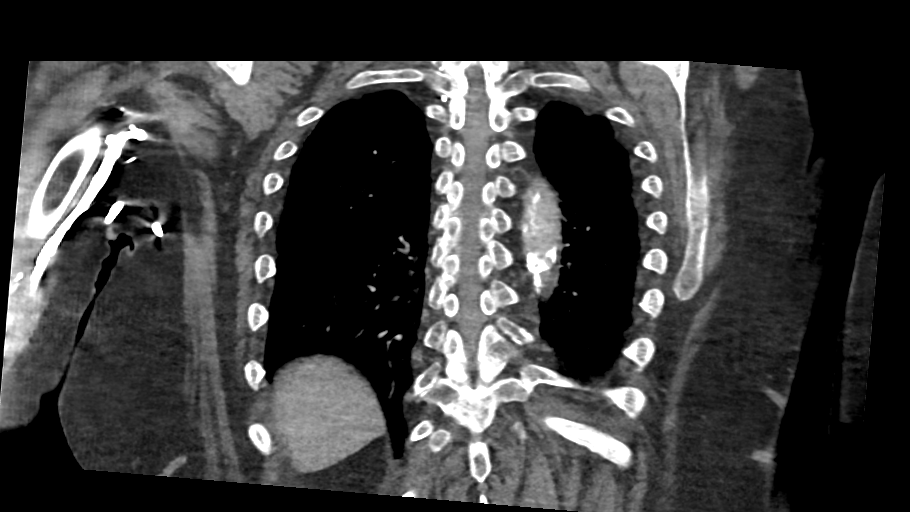

[15 of 46 positions shown; findings below may reference images not displayed]

RADIATION DOSE REDUCTION: This exam was performed according to the
departmental dose-optimization program which includes automated
exposure control, adjustment of the mA and/or kV according to
patient size and/or use of iterative reconstruction technique.

CONTRAST:  100mL OMNIPAQUE IOHEXOL 350 MG/ML SOLN
FINDINGS: CTA CHEST FINDINGS

Cardiovascular: Atherosclerotic calcifications of the thoracic aorta
are noted. No aneurysmal dilatation or definitive dissection is
seen. Coronary calcifications are noted. Mild cardiomegaly is seen.
The pulmonary artery shows a normal branching pattern. No filling
defect to suggest pulmonary embolism is noted.

Mediastinum/Nodes: Thoracic inlet is within normal limits. No hilar
or mediastinal adenopathy is noted. The esophagus as visualized is
within normal limits.

Lungs/Pleura: Mild dependent atelectatic changes are noted in the
lungs bilaterally. No focal infiltrate or sizable effusion is seen.
No parenchymal nodules are noted.

Musculoskeletal: Degenerative changes of the thoracic spine are
seen. No acute rib abnormality is noted. No compression deformity is
seen.

Review of the MIP images confirms the above findings.

CT ABDOMEN and PELVIS FINDINGS

Hepatobiliary: Mild fatty infiltration of the liver is noted.
Gallbladder is within normal limits.

Pancreas: Unremarkable. No pancreatic ductal dilatation or
surrounding inflammatory changes.

Spleen: Normal in size without focal abnormality.

Adrenals/Urinary Tract: Adrenal glands are unremarkable. Kidneys
show a normal enhancement pattern. Mild atrophic changes are noted.
Scattered cysts are seen which appear simple in nature. The largest
of these measures up to 5.6 cm in the left kidney. These are stable
in appearance from prior exam. Bladder is decompressed.

Stomach/Bowel: The appendix is within normal limits. No obstructive
or inflammatory changes of the colon are seen. Small bowel and
stomach show evidence of a small hiatal hernia.

Vascular/Lymphatic: Aortic atherosclerosis. No enlarged abdominal or
pelvic lymph nodes.

Reproductive: Status post hysterectomy. No adnexal masses.

Other: No abdominal wall hernia or abnormality. No abdominopelvic
ascites.

Musculoskeletal: No acute bony abnormality is noted. Degenerative
changes of the lumbar spine are seen. There is clinical history of
pressure sores in the buttocks bilaterally although no definitive
lesion is noted.

Review of the MIP images confirms the above findings.
IMPRESSION: CTA of the chest: No evidence of pulmonary embolism.

Dependent atelectatic changes bilaterally.

CT of the abdomen and pelvis: Fatty infiltration of the liver.

Mild renal atrophy with stable appearing simple cysts. No follow-up
is recommended.

No other focal abnormality is seen.

Aortic Atherosclerosis (MJ84W-OA7.7).

## 2022-07-10 IMAGING — CT CT ABD-PELV W/ CM
2 of 5 series · 15 of 46 positions shown, 17 images · IV contrast (APPLIED)
Comparison: 02/12/2022,

CLINICAL DATA: Weakness



[Series 5: abdomen 5.0 · axial · 0.87mm/px · z∈[-1069,-629]mm · 12 of 104 slices shown, 14 images]
[im 8/104  soft-tissue]
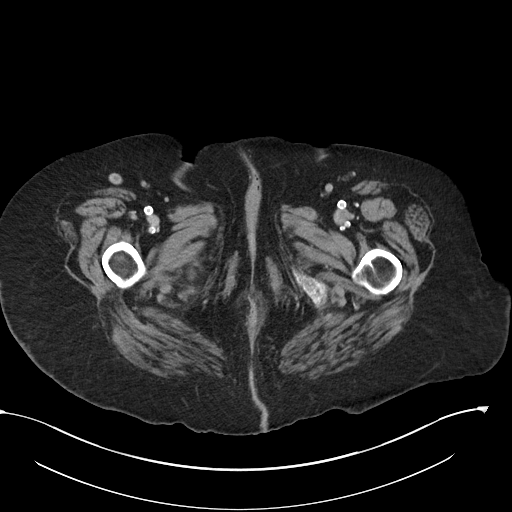
[im 8/104  bone]
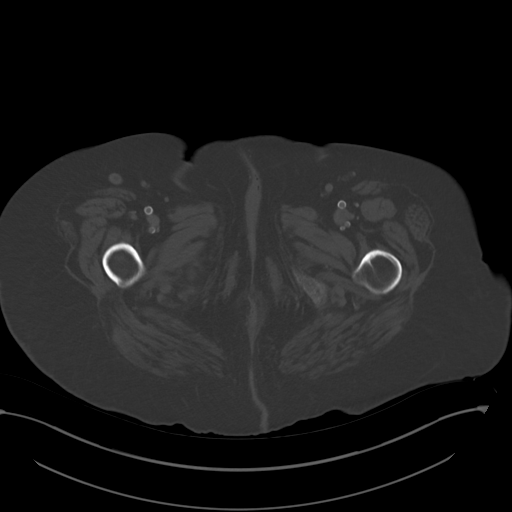
[im 16/104  soft-tissue]
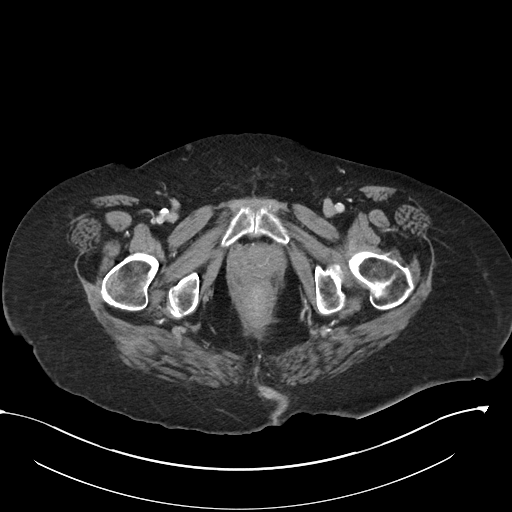
[im 24/104  soft-tissue]
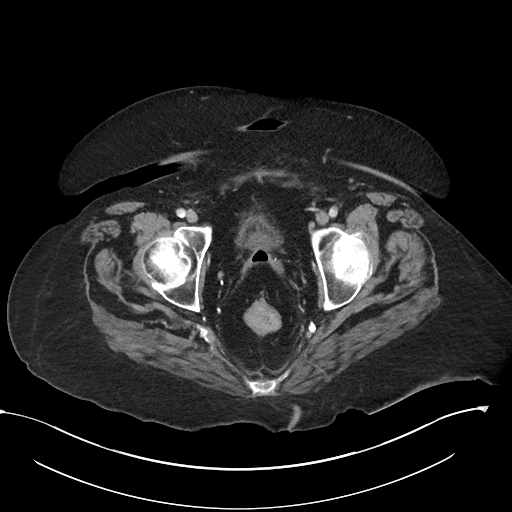
[im 32/104  soft-tissue]
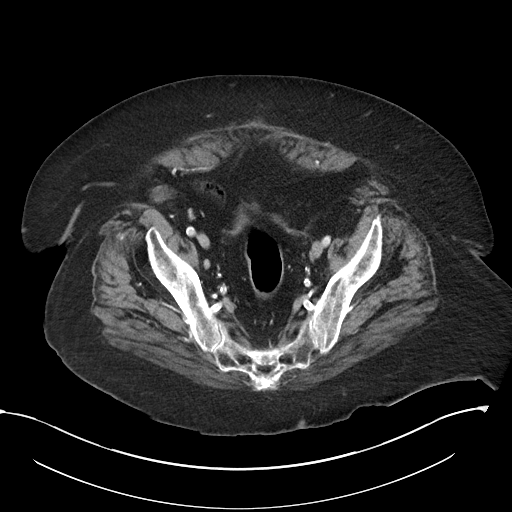
[im 40/104  soft-tissue]
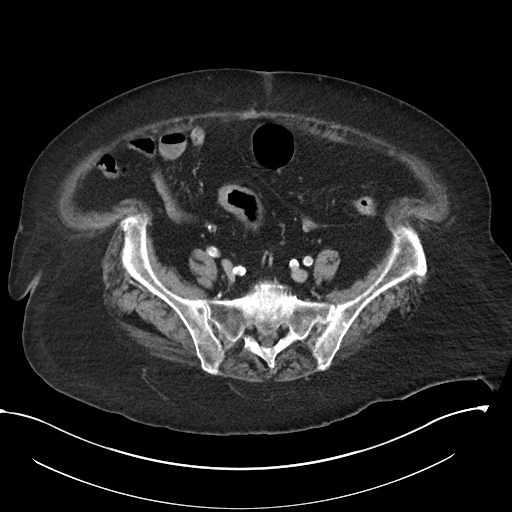
[im 48/104  soft-tissue]
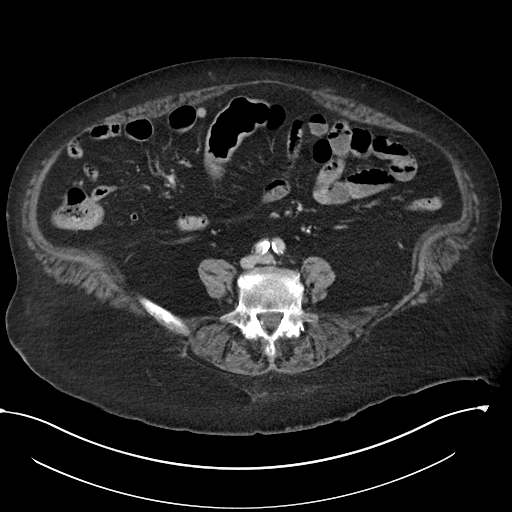
[im 56/104  soft-tissue]
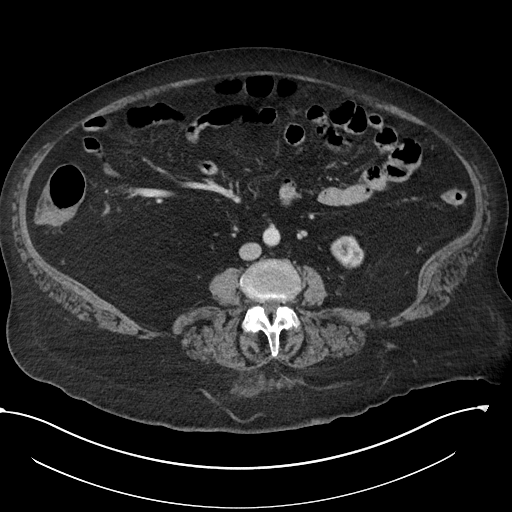
[im 64/104  soft-tissue]
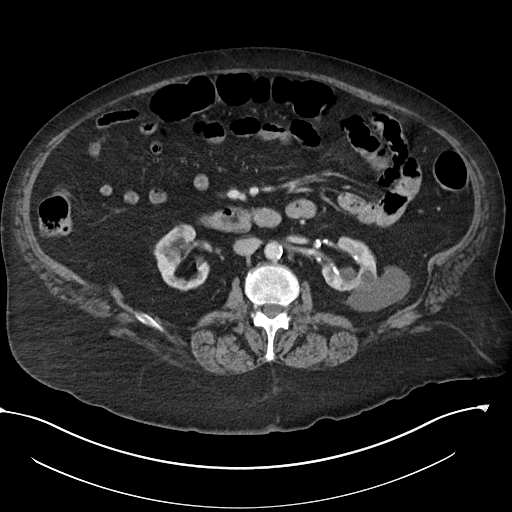
[im 72/104  soft-tissue]
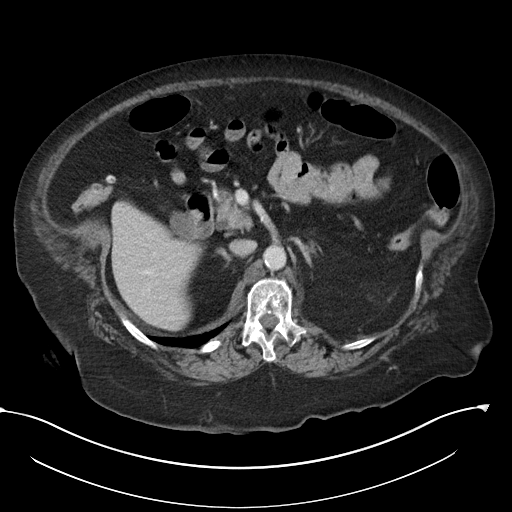
[im 72/104  bone]
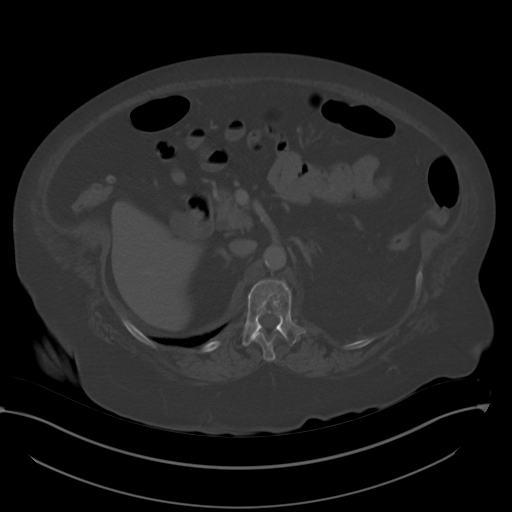
[im 80/104  soft-tissue]
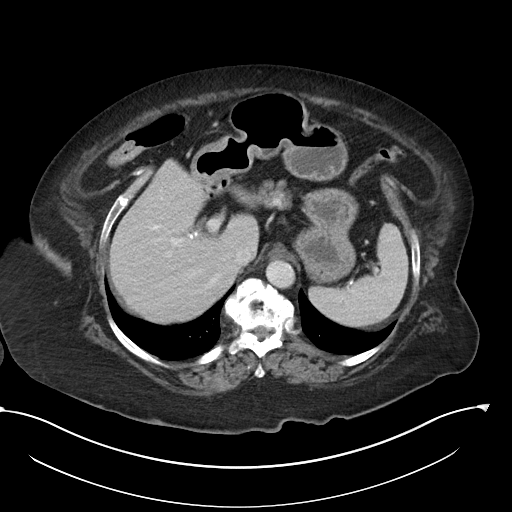
[im 88/104  soft-tissue]
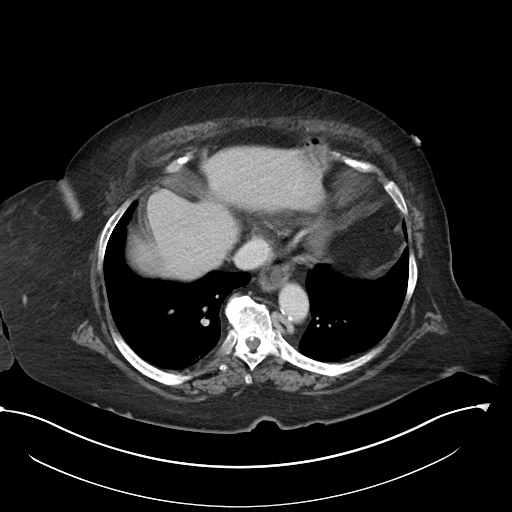
[im 96/104  soft-tissue]
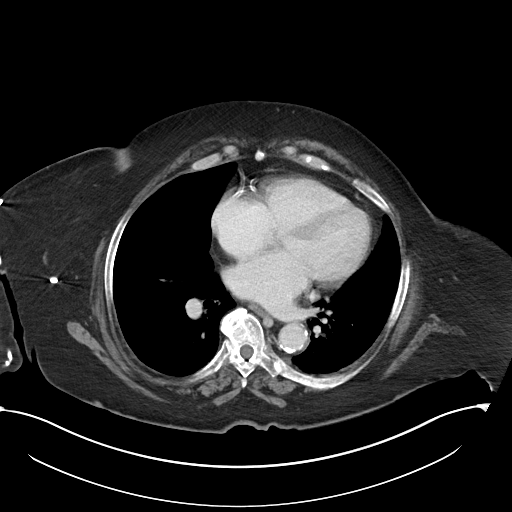

[Series 8: abdomen 3.0 mpr cor · coronal · 1.00mm/px · 3 of 107 slices shown]
[im 36/107  soft-tissue]
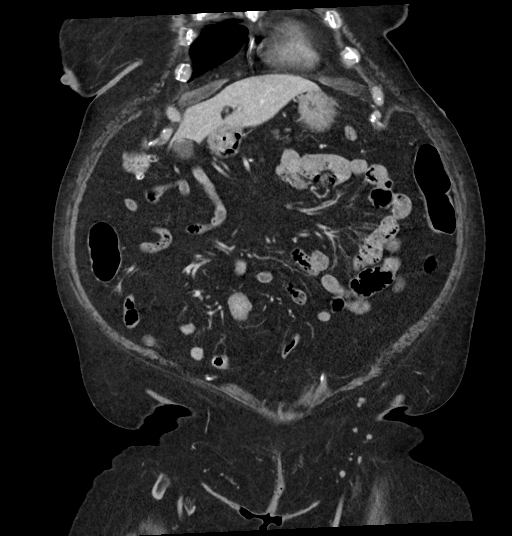
[im 48/107  soft-tissue]
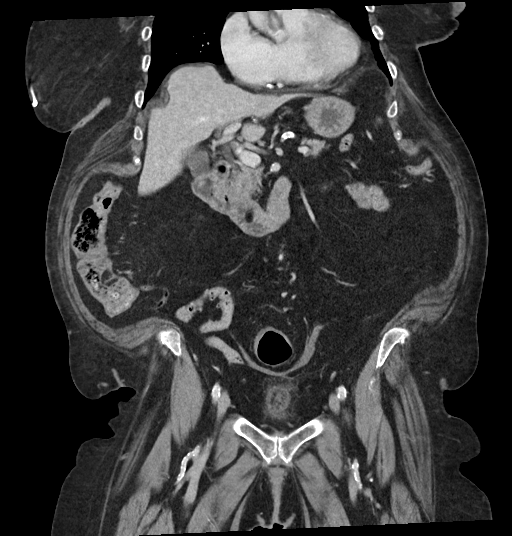
[im 59/107  soft-tissue]
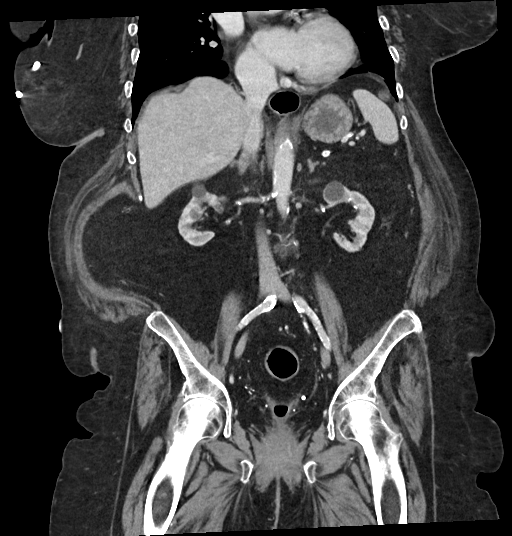

[15 of 46 positions shown; findings below may reference images not displayed]

RADIATION DOSE REDUCTION: This exam was performed according to the
departmental dose-optimization program which includes automated
exposure control, adjustment of the mA and/or kV according to
patient size and/or use of iterative reconstruction technique.

CONTRAST:  100mL OMNIPAQUE IOHEXOL 350 MG/ML SOLN
FINDINGS: CTA CHEST FINDINGS

Cardiovascular: Atherosclerotic calcifications of the thoracic aorta
are noted. No aneurysmal dilatation or definitive dissection is
seen. Coronary calcifications are noted. Mild cardiomegaly is seen.
The pulmonary artery shows a normal branching pattern. No filling
defect to suggest pulmonary embolism is noted.

Mediastinum/Nodes: Thoracic inlet is within normal limits. No hilar
or mediastinal adenopathy is noted. The esophagus as visualized is
within normal limits.

Lungs/Pleura: Mild dependent atelectatic changes are noted in the
lungs bilaterally. No focal infiltrate or sizable effusion is seen.
No parenchymal nodules are noted.

Musculoskeletal: Degenerative changes of the thoracic spine are
seen. No acute rib abnormality is noted. No compression deformity is
seen.

Review of the MIP images confirms the above findings.

CT ABDOMEN and PELVIS FINDINGS

Hepatobiliary: Mild fatty infiltration of the liver is noted.
Gallbladder is within normal limits.

Pancreas: Unremarkable. No pancreatic ductal dilatation or
surrounding inflammatory changes.

Spleen: Normal in size without focal abnormality.

Adrenals/Urinary Tract: Adrenal glands are unremarkable. Kidneys
show a normal enhancement pattern. Mild atrophic changes are noted.
Scattered cysts are seen which appear simple in nature. The largest
of these measures up to 5.6 cm in the left kidney. These are stable
in appearance from prior exam. Bladder is decompressed.

Stomach/Bowel: The appendix is within normal limits. No obstructive
or inflammatory changes of the colon are seen. Small bowel and
stomach show evidence of a small hiatal hernia.

Vascular/Lymphatic: Aortic atherosclerosis. No enlarged abdominal or
pelvic lymph nodes.

Reproductive: Status post hysterectomy. No adnexal masses.

Other: No abdominal wall hernia or abnormality. No abdominopelvic
ascites.

Musculoskeletal: No acute bony abnormality is noted. Degenerative
changes of the lumbar spine are seen. There is clinical history of
pressure sores in the buttocks bilaterally although no definitive
lesion is noted.

Review of the MIP images confirms the above findings.
IMPRESSION: CTA of the chest: No evidence of pulmonary embolism.

Dependent atelectatic changes bilaterally.

CT of the abdomen and pelvis: Fatty infiltration of the liver.

Mild renal atrophy with stable appearing simple cysts. No follow-up
is recommended.

No other focal abnormality is seen.

Aortic Atherosclerosis (MJ84W-OA7.7).

## 2022-07-10 NOTE — Progress Notes (Signed)
This encounter was created in error - please disregard.

## 2022-07-10 NOTE — Patient Outreach (Signed)
  Care Coordination   07/10/2022 Name: Jacqueline [name] MRN: [id number] DOB: 1946-09-23   Care Coordination Outreach Attempts:  An unsuccessful telephone outreach was attempted today to offer the patient information about available care coordination services as a benefit of their health plan.   Follow Up Plan:  Additional outreach attempts will be made to offer the patient care coordination information and services.   Encounter Outcome:  No Answer  Care Coordination Interventions Activated:  No   Care Coordination Interventions:  No, not indicated    Barb Merino, RN, BSN, CCM Care Management Coordinator [facility] Care Management  Direct Phone: [phone]

## 2022-07-11 DIAGNOSIS — I959 Hypotension, unspecified: Secondary | ICD-10-CM | POA: Diagnosis not present

## 2022-07-11 DIAGNOSIS — J99 Respiratory disorders in diseases classified elsewhere: Secondary | ICD-10-CM | POA: Diagnosis not present

## 2022-07-11 DIAGNOSIS — E569 Vitamin deficiency, unspecified: Secondary | ICD-10-CM | POA: Diagnosis not present

## 2022-07-11 DIAGNOSIS — K2100 Gastro-esophageal reflux disease with esophagitis, without bleeding: Secondary | ICD-10-CM | POA: Diagnosis not present

## 2022-07-11 DIAGNOSIS — E785 Hyperlipidemia, unspecified: Secondary | ICD-10-CM | POA: Diagnosis not present

## 2022-07-11 DIAGNOSIS — D689 Coagulation defect, unspecified: Secondary | ICD-10-CM | POA: Diagnosis not present

## 2022-07-11 DIAGNOSIS — I132 Hypertensive heart and chronic kidney disease with heart failure and with stage 5 chronic kidney disease, or end stage renal disease: Secondary | ICD-10-CM | POA: Diagnosis not present

## 2022-07-11 DIAGNOSIS — N2581 Secondary hyperparathyroidism of renal origin: Secondary | ICD-10-CM | POA: Diagnosis not present

## 2022-07-11 DIAGNOSIS — Z741 Need for assistance with personal care: Secondary | ICD-10-CM | POA: Diagnosis not present

## 2022-07-11 DIAGNOSIS — R2681 Unsteadiness on feet: Secondary | ICD-10-CM | POA: Diagnosis not present

## 2022-07-11 DIAGNOSIS — N186 End stage renal disease: Secondary | ICD-10-CM | POA: Diagnosis not present

## 2022-07-11 DIAGNOSIS — I2510 Atherosclerotic heart disease of native coronary artery without angina pectoris: Secondary | ICD-10-CM | POA: Diagnosis not present

## 2022-07-11 DIAGNOSIS — Z992 Dependence on renal dialysis: Secondary | ICD-10-CM | POA: Diagnosis not present

## 2022-07-12 DIAGNOSIS — I132 Hypertensive heart and chronic kidney disease with heart failure and with stage 5 chronic kidney disease, or end stage renal disease: Secondary | ICD-10-CM | POA: Diagnosis not present

## 2022-07-12 DIAGNOSIS — R2681 Unsteadiness on feet: Secondary | ICD-10-CM | POA: Diagnosis not present

## 2022-07-12 DIAGNOSIS — I2510 Atherosclerotic heart disease of native coronary artery without angina pectoris: Secondary | ICD-10-CM | POA: Diagnosis not present

## 2022-07-12 DIAGNOSIS — J99 Respiratory disorders in diseases classified elsewhere: Secondary | ICD-10-CM | POA: Diagnosis not present

## 2022-07-12 DIAGNOSIS — N186 End stage renal disease: Secondary | ICD-10-CM | POA: Diagnosis not present

## 2022-07-12 DIAGNOSIS — Z741 Need for assistance with personal care: Secondary | ICD-10-CM | POA: Diagnosis not present

## 2022-07-12 DIAGNOSIS — I959 Hypotension, unspecified: Secondary | ICD-10-CM | POA: Diagnosis not present

## 2022-07-12 DIAGNOSIS — E785 Hyperlipidemia, unspecified: Secondary | ICD-10-CM | POA: Diagnosis not present

## 2022-07-12 DIAGNOSIS — G629 Polyneuropathy, unspecified: Secondary | ICD-10-CM | POA: Diagnosis not present

## 2022-07-12 DIAGNOSIS — G8929 Other chronic pain: Secondary | ICD-10-CM | POA: Diagnosis not present

## 2022-07-12 DIAGNOSIS — E569 Vitamin deficiency, unspecified: Secondary | ICD-10-CM | POA: Diagnosis not present

## 2022-07-12 DIAGNOSIS — K2100 Gastro-esophageal reflux disease with esophagitis, without bleeding: Secondary | ICD-10-CM | POA: Diagnosis not present

## 2022-07-13 DIAGNOSIS — N2581 Secondary hyperparathyroidism of renal origin: Secondary | ICD-10-CM | POA: Diagnosis not present

## 2022-07-13 DIAGNOSIS — D689 Coagulation defect, unspecified: Secondary | ICD-10-CM | POA: Diagnosis not present

## 2022-07-13 DIAGNOSIS — Z992 Dependence on renal dialysis: Secondary | ICD-10-CM | POA: Diagnosis not present

## 2022-07-13 DIAGNOSIS — N186 End stage renal disease: Secondary | ICD-10-CM | POA: Diagnosis not present

## 2022-07-14 DIAGNOSIS — K2100 Gastro-esophageal reflux disease with esophagitis, without bleeding: Secondary | ICD-10-CM | POA: Diagnosis not present

## 2022-07-14 DIAGNOSIS — J99 Respiratory disorders in diseases classified elsewhere: Secondary | ICD-10-CM | POA: Diagnosis not present

## 2022-07-14 DIAGNOSIS — E569 Vitamin deficiency, unspecified: Secondary | ICD-10-CM | POA: Diagnosis not present

## 2022-07-14 DIAGNOSIS — I2510 Atherosclerotic heart disease of native coronary artery without angina pectoris: Secondary | ICD-10-CM | POA: Diagnosis not present

## 2022-07-14 DIAGNOSIS — Z741 Need for assistance with personal care: Secondary | ICD-10-CM | POA: Diagnosis not present

## 2022-07-14 DIAGNOSIS — R2681 Unsteadiness on feet: Secondary | ICD-10-CM | POA: Diagnosis not present

## 2022-07-14 DIAGNOSIS — I132 Hypertensive heart and chronic kidney disease with heart failure and with stage 5 chronic kidney disease, or end stage renal disease: Secondary | ICD-10-CM | POA: Diagnosis not present

## 2022-07-14 DIAGNOSIS — E785 Hyperlipidemia, unspecified: Secondary | ICD-10-CM | POA: Diagnosis not present

## 2022-07-14 DIAGNOSIS — I959 Hypotension, unspecified: Secondary | ICD-10-CM | POA: Diagnosis not present

## 2022-07-14 DIAGNOSIS — N186 End stage renal disease: Secondary | ICD-10-CM | POA: Diagnosis not present

## 2022-07-16 DIAGNOSIS — N186 End stage renal disease: Secondary | ICD-10-CM | POA: Diagnosis not present

## 2022-07-16 DIAGNOSIS — N2581 Secondary hyperparathyroidism of renal origin: Secondary | ICD-10-CM | POA: Diagnosis not present

## 2022-07-16 DIAGNOSIS — Z992 Dependence on renal dialysis: Secondary | ICD-10-CM | POA: Diagnosis not present

## 2022-07-16 DIAGNOSIS — D689 Coagulation defect, unspecified: Secondary | ICD-10-CM | POA: Diagnosis not present

## 2022-07-17 ENCOUNTER — Ambulatory Visit: Payer: Medicare Other

## 2022-07-18 ENCOUNTER — Telehealth: Payer: Self-pay | Admitting: Nurse Practitioner

## 2022-07-18 DIAGNOSIS — Z992 Dependence on renal dialysis: Secondary | ICD-10-CM | POA: Diagnosis not present

## 2022-07-18 DIAGNOSIS — D689 Coagulation defect, unspecified: Secondary | ICD-10-CM | POA: Diagnosis not present

## 2022-07-18 DIAGNOSIS — N186 End stage renal disease: Secondary | ICD-10-CM | POA: Diagnosis not present

## 2022-07-18 DIAGNOSIS — N2581 Secondary hyperparathyroidism of renal origin: Secondary | ICD-10-CM | POA: Diagnosis not present

## 2022-07-18 NOTE — Telephone Encounter (Signed)
Left message for patient to call back and schedule Medicare Annual Wellness Visit (AWV) either virtually or in office.  Left both my jabber number [phone] and office number    Last AWV ;06/22/21  please schedule at anytime with [facility]

## 2022-07-20 DIAGNOSIS — N186 End stage renal disease: Secondary | ICD-10-CM | POA: Diagnosis not present

## 2022-07-20 DIAGNOSIS — D689 Coagulation defect, unspecified: Secondary | ICD-10-CM | POA: Diagnosis not present

## 2022-07-20 DIAGNOSIS — N2581 Secondary hyperparathyroidism of renal origin: Secondary | ICD-10-CM | POA: Diagnosis not present

## 2022-07-20 DIAGNOSIS — Z992 Dependence on renal dialysis: Secondary | ICD-10-CM | POA: Diagnosis not present

## 2022-07-23 DIAGNOSIS — D689 Coagulation defect, unspecified: Secondary | ICD-10-CM | POA: Diagnosis not present

## 2022-07-23 DIAGNOSIS — N186 End stage renal disease: Secondary | ICD-10-CM | POA: Diagnosis not present

## 2022-07-23 DIAGNOSIS — Z992 Dependence on renal dialysis: Secondary | ICD-10-CM | POA: Diagnosis not present

## 2022-07-23 DIAGNOSIS — N2581 Secondary hyperparathyroidism of renal origin: Secondary | ICD-10-CM | POA: Diagnosis not present

## 2022-07-25 DIAGNOSIS — N186 End stage renal disease: Secondary | ICD-10-CM | POA: Diagnosis not present

## 2022-07-25 DIAGNOSIS — N2581 Secondary hyperparathyroidism of renal origin: Secondary | ICD-10-CM | POA: Diagnosis not present

## 2022-07-25 DIAGNOSIS — D689 Coagulation defect, unspecified: Secondary | ICD-10-CM | POA: Diagnosis not present

## 2022-07-25 DIAGNOSIS — Z992 Dependence on renal dialysis: Secondary | ICD-10-CM | POA: Diagnosis not present

## 2022-07-27 DIAGNOSIS — N2581 Secondary hyperparathyroidism of renal origin: Secondary | ICD-10-CM | POA: Diagnosis not present

## 2022-07-27 DIAGNOSIS — D689 Coagulation defect, unspecified: Secondary | ICD-10-CM | POA: Diagnosis not present

## 2022-07-27 DIAGNOSIS — N186 End stage renal disease: Secondary | ICD-10-CM | POA: Diagnosis not present

## 2022-07-27 DIAGNOSIS — Z992 Dependence on renal dialysis: Secondary | ICD-10-CM | POA: Diagnosis not present

## 2022-07-29 DIAGNOSIS — I959 Hypotension, unspecified: Secondary | ICD-10-CM | POA: Diagnosis not present

## 2022-07-29 DIAGNOSIS — M6281 Muscle weakness (generalized): Secondary | ICD-10-CM | POA: Diagnosis not present

## 2022-07-29 DIAGNOSIS — J9611 Chronic respiratory failure with hypoxia: Secondary | ICD-10-CM | POA: Diagnosis not present

## 2022-07-29 DIAGNOSIS — N186 End stage renal disease: Secondary | ICD-10-CM | POA: Diagnosis not present

## 2022-07-30 DIAGNOSIS — Z992 Dependence on renal dialysis: Secondary | ICD-10-CM | POA: Diagnosis not present

## 2022-07-30 DIAGNOSIS — N186 End stage renal disease: Secondary | ICD-10-CM | POA: Diagnosis not present

## 2022-07-30 DIAGNOSIS — D689 Coagulation defect, unspecified: Secondary | ICD-10-CM | POA: Diagnosis not present

## 2022-07-30 DIAGNOSIS — N2581 Secondary hyperparathyroidism of renal origin: Secondary | ICD-10-CM | POA: Diagnosis not present

## 2022-08-01 ENCOUNTER — Telehealth: Payer: Self-pay | Admitting: Nurse Practitioner

## 2022-08-01 DIAGNOSIS — N186 End stage renal disease: Secondary | ICD-10-CM | POA: Diagnosis not present

## 2022-08-01 DIAGNOSIS — N2581 Secondary hyperparathyroidism of renal origin: Secondary | ICD-10-CM | POA: Diagnosis not present

## 2022-08-01 DIAGNOSIS — E1129 Type 2 diabetes mellitus with other diabetic kidney complication: Secondary | ICD-10-CM | POA: Diagnosis not present

## 2022-08-01 DIAGNOSIS — D689 Coagulation defect, unspecified: Secondary | ICD-10-CM | POA: Diagnosis not present

## 2022-08-01 DIAGNOSIS — Z992 Dependence on renal dialysis: Secondary | ICD-10-CM | POA: Diagnosis not present

## 2022-08-01 NOTE — Telephone Encounter (Signed)
Left message for patient to call back and schedule Medicare Annual Wellness Visit (AWV) either virtually or in office.  Left both my jabber number [phone] and office number    Last AWV ;06/27/21  please schedule at anytime with [facility]

## 2022-08-02 ENCOUNTER — Encounter: Payer: Self-pay | Admitting: Nurse Practitioner

## 2022-08-02 ENCOUNTER — Ambulatory Visit: Payer: Medicare Other | Attending: Nurse Practitioner | Admitting: Nurse Practitioner

## 2022-08-02 NOTE — Progress Notes (Deleted)
Cardiology Clinic Note   Patient Name: Jacqueline [name] Date of Encounter: 08/02/2022  Primary Care Provider:  [name], FNP Primary Cardiologist:  Fransico Him, MD  Patient Profile    76 year old female with a past medical history of coronary artery disease with previous non-ST elevated myocardial infarction 2018 treated with drug-eluting stent placement to the right coronary artery, end-stage renal disease on hemodialysis, type 2 diabetes, essential hypertension, hyperlipidemia, peripheral arterial disease, and obesity, who is here today to follow-up on her coronary artery disease.  Past Medical History    Past Medical History:  Diagnosis Date   Arthritis    "left knee" (12/04/2016)   CAD (coronary artery disease)    a. 12/2016 NSTEMI/PCI: LM nl, LaD 20p/d, D1 20, OM2 20, OM3 20, RCA 75m (3.0x18 Resolute DES). EF 65%.   Chronic lower back pain    Diverticulosis    on CT   ESRD on dialysis [facility])    "[place]; [place]; TTS" (12/04/2016)   GERD (gastroesophageal reflux disease)    Hiatal hernia    Hyperlipidemia    Hypertension    a. 12/2016 labetalol d/c'd 2/2 hypotension in HD.   Morbid obesity ([place])    Obesity    Pneumonia    "couple times" (12/04/2016)   Renal insufficiency    Type II diabetes mellitus ([place])    Past Surgical History:  Procedure Laterality Date   ABDOMINAL HYSTERECTOMY     AV FISTULA PLACEMENT Left 01/27/2014   Procedure: ARTERIOVENOUS (AV) FISTULA CREATION;  Surgeon: Mal Misty, MD;  Location: [place];  Service: Vascular;  Laterality: Left;   AV FISTULA REPAIR     "had it cleaned out"   CARDIAC CATHETERIZATION  ?[place] N/A 12/04/2016   Procedure: Left Heart Cath Angiography;  Surgeon: Burnell Blanks, MD;  Location: [place] CV LAB;  Service: Cardiovascular;  Laterality: N/A;   CARDIAC CATHETERIZATION N/A 12/04/2016   Procedure: Coronary Stent Intervention;  Surgeon: Burnell Blanks, MD;   Location: [place] CV LAB;  Service: Cardiovascular;  Laterality: N/A;  Mid RCA   CATARACT EXTRACTION W/ INTRAOCULAR LENS IMPLANT Left    COLONOSCOPY W/ BIOPSIES AND POLYPECTOMY     CORONARY ANGIOPLASTY WITH STENT PLACEMENT  12/04/2016   ESOPHAGOGASTRODUODENOSCOPY     INSERTION OF DIALYSIS CATHETER N/A 01/19/2014   Procedure: INSERTION OF DIALYSIS CATHETER;  Surgeon: Rosetta Posner, MD;  Location: [facility] OR;  Service: Vascular;  Laterality: N/A;    Allergies  Allergies  Allergen Reactions   Other Other (See Comments)   Sulfa Antibiotics Other (See Comments)   Sulfa Drugs Cross Reactors Other (See Comments)    Doesn't remember     History of Present Illness    76 year old female with a past complex medical history of peripheral arterial disease, coronary artery disease with previous non-ST elevated myocardial infarction 2008 treated with DES placement to the right coronary artery, end-stage renal disease on hemodialysis, type 2 diabetes, essential hypertension, hyperlipidemia, and obesity.  Last echocardiogram was on 06/23/2019 which revealed LVEF of 60 to 65%.  Cavity size was normal, there was mild asymmetric left ventricular hypertrophy, left ventricular diastolic parameters consistent with impaired relaxation.  Moderate mitral annular calcification was present, mild thickening of the aortic valve, mild calcification of the aortic valve.  She was evaluated in the emergency department at [facility] 05/21/2022 at the request of her primary care provider.  Patient states she has had difficulty getting around for  several years but for the past 9 months she continued to decline and was unable to get up from a seated position without assistance.  She had recently started  physical therapy prior to admission and noted no improvement.  At the time of admission her son and daughter-in-law assisted with all of her ADLs.  She states at that time she was only able to feed herself.  Unfortunately due to her  decline in mobility she had developed stage III sacral ulcers.  She was last seen in clinic 04/26/2022 by Dr. Fletcher Anon for evaluation of PAD.  She had recently been seen by her podiatrist for bilateral foot pain worse on the left side.  The pain the left side happened mostly at night and improves when she hangs her legs down.  Her functional capacity was overall poor and she was walking very slowly with a walker.  Most of her claudication symptoms were at night.  She underwent noninvasive vascular studies which showed noncompressible vessels on the right with an ABI of 0.6 on the left.  Duplex showed no significant disease but she was noted to have monophasic waveform in the left common femoral artery suggestive of inflow disease. After discussion on treatment options the patient decided for conservative therapy.  She returns to clinic today  Home Medications    Current Outpatient Medications  Medication Sig Dispense Refill   acetaminophen (TYLENOL) 500 MG tablet Take 1,000 mg by mouth every 6 (six) hours as needed for moderate pain or headache.     albuterol (VENTOLIN HFA) 108 (90 Base) MCG/ACT inhaler Inhale 1-2 puffs into the lungs every 6 (six) hours as needed for wheezing or shortness of breath. 1 each 0   aspirin EC 81 MG EC tablet Take 1 tablet (81 mg total) by mouth daily.     AURYXIA 1 GM 210 MG(Fe) tablet Take 420 mg by mouth 2 (two) times a day.     B Complex-C-Zn-Folic Acid (DIALYVITE/ZINC) TABS Take 1 tablet by mouth daily.     cinacalcet (SENSIPAR) 30 MG tablet Take 30 mg by mouth daily.     Doxercalciferol (HECTOROL IV) Dialysis Tuesday,Thursday and saturday     ezetimibe (ZETIA) 10 MG tablet Take 1 tablet (10 mg total) by mouth daily. 30 tablet 5   midodrine (PROAMATINE) 10 MG tablet Take 10 mg by mouth See admin instructions. Dialysis days [place] Saturday     Omega-3 Fatty Acids (FISH OIL) 1000 MG CPDR Take 1,000 mg by mouth daily.  (Patient not taking: Reported on 04/26/2022)      omeprazole (PRILOSEC) 20 MG capsule Take 1 capsule (20 mg total) by mouth daily. 90 capsule 1   rosuvastatin (CRESTOR) 40 MG tablet TAKE 1 TABLET BY MOUTH DAILY 30 tablet 11   Vitamin D, Ergocalciferol, 50 MCG (2000 UT) CAPS Take 2,000 Units by mouth daily.     No current facility-administered medications for this visit.     Family History    Family History  Problem Relation Age of Onset   Diabetes Mother    Peripheral vascular disease Mother        amputation   CAD Father    Heart disease Father    Hypertension Father    Heart attack Father    CAD Sister    Cancer Brother        x 2 brothers, type unknown   Breast cancer Sister    She indicated that her mother is deceased. She indicated that her father  is deceased. She indicated that two of her four sisters are deceased. She indicated that the status of her brother is unknown. She indicated that her maternal grandmother is deceased. She indicated that her maternal grandfather is deceased. She indicated that her paternal grandmother is deceased. She indicated that her paternal grandfather is deceased. She indicated that her daughter is alive. She indicated that all of her three sons are alive.  Social History    Social History   Socioeconomic History   Marital status: Widowed    Spouse name: Not on file   Number of children: 4   Years of education: Not on file   Highest education level: Not on file  Occupational History   Occupation: retired  Tobacco Use   Smoking status: Former    Packs/day: 1.00    Years: 20.00    Total pack years: 20.00    Types: Cigarettes    Quit date: 1985    Years since quitting: 38.6   Smokeless tobacco: Never  Vaping Use   Vaping Use: Never used  Substance and Sexual Activity   Alcohol use: No    Alcohol/week: 0.0 standard drinks of alcohol   Drug use: No   Sexual activity: Not Currently  Other Topics Concern   Not on file  Social History Narrative   Not on file   Social  Determinants of Health   Financial Resource Strain: Medium Risk (06/27/2021)   Overall Financial Resource Strain (CARDIA)    Difficulty of Paying Living Expenses: Somewhat hard  Food Insecurity: No Food Insecurity (04/03/2022)   Hunger Vital Sign    Worried About Running Out of Food in the Last Year: Never true    [place] in the Last Year: Never true  Transportation Needs: No Transportation Needs (04/03/2022)   PRAPARE - Hydrologist (Medical): No    Lack of Transportation (Non-Medical): No  Physical Activity: Inactive (06/27/2021)   Exercise Vital Sign    Days of Exercise per Week: 0 days    Minutes of Exercise per Session: 0 min  Stress: No Stress Concern Present (06/27/2021)   [place]    Feeling of Stress : Not at all  Social Connections: Not on file  Intimate Partner Violence: Not At Risk (05/12/2019)   Humiliation, Afraid, Rape, and Kick questionnaire    Fear of Current or Ex-Partner: No    Emotionally Abused: No    Physically Abused: No    Sexually Abused: No     Review of Systems    General:  No chills, fever, night sweats or weight changes.  Cardiovascular:  No chest pain, dyspnea on exertion, edema, orthopnea, palpitations, paroxysmal nocturnal dyspnea. Dermatological: No rash, lesions/masses Respiratory: No cough, dyspnea Urologic: No hematuria, dysuria Abdominal:   No nausea, vomiting, diarrhea, bright red blood per rectum, melena, or hematemesis Neurologic:  No visual changes, wkns, changes in mental status. All other systems reviewed and are otherwise negative except as noted above.     Physical Exam    VS:  There were no vitals taken for this visit. , BMI There is no height or weight on file to calculate BMI.     GEN: Well nourished, well developed, in no acute distress. HEENT: normal. Neck: Supple, no JVD, carotid bruits, or masses. Cardiac: RRR, no murmurs,  rubs, or gallops. No clubbing, cyanosis, edema.  Radials/DP/PT 2+ and equal bilaterally.  Respiratory:  Respirations regular and  unlabored, clear to auscultation bilaterally. GI: Soft, nontender, nondistended, BS + x 4. MS: no deformity or atrophy. Skin: warm and dry, no rash. Neuro:  Strength and sensation are intact. Psych: Normal affect.  Accessory Clinical Findings    ECG personally reviewed by me today- *** - No acute changes  Lab Results  Component Value Date   WBC 7.1 05/29/2022   HGB 11.3 (L) 05/29/2022   HCT 35.9 (L) 05/29/2022   MCV 96.2 05/29/2022   PLT 163 05/29/2022   Lab Results  Component Value Date   CREATININE 8.21 (H) 05/30/2022   BUN 47 (H) 05/30/2022   NA 139 05/30/2022   K 4.3 05/30/2022   CL 101 05/30/2022   CO2 30 05/30/2022   Lab Results  Component Value Date   ALT 20 05/21/2022   AST 36 05/21/2022   ALKPHOS 84 05/21/2022   BILITOT 1.0 05/21/2022   Lab Results  Component Value Date   CHOL 142 10/10/2021   HDL 46 10/10/2021   LDLCALC 73 10/10/2021   TRIG 133 10/10/2021   CHOLHDL 3.1 10/10/2021    Lab Results  Component Value Date   HGBA1C 5.9 (H) 05/22/2022    Assessment & Plan   1.  ***  Lerlene Treadwell, NP 08/02/2022, 7:50 AM

## 2022-08-03 DIAGNOSIS — Z992 Dependence on renal dialysis: Secondary | ICD-10-CM | POA: Diagnosis not present

## 2022-08-03 DIAGNOSIS — D631 Anemia in chronic kidney disease: Secondary | ICD-10-CM | POA: Diagnosis not present

## 2022-08-03 DIAGNOSIS — D509 Iron deficiency anemia, unspecified: Secondary | ICD-10-CM | POA: Diagnosis not present

## 2022-08-03 DIAGNOSIS — D689 Coagulation defect, unspecified: Secondary | ICD-10-CM | POA: Diagnosis not present

## 2022-08-03 DIAGNOSIS — E1129 Type 2 diabetes mellitus with other diabetic kidney complication: Secondary | ICD-10-CM | POA: Diagnosis not present

## 2022-08-03 DIAGNOSIS — N2581 Secondary hyperparathyroidism of renal origin: Secondary | ICD-10-CM | POA: Diagnosis not present

## 2022-08-03 DIAGNOSIS — N186 End stage renal disease: Secondary | ICD-10-CM | POA: Diagnosis not present

## 2022-08-06 DIAGNOSIS — N2581 Secondary hyperparathyroidism of renal origin: Secondary | ICD-10-CM | POA: Diagnosis not present

## 2022-08-06 DIAGNOSIS — G629 Polyneuropathy, unspecified: Secondary | ICD-10-CM | POA: Diagnosis not present

## 2022-08-06 DIAGNOSIS — N186 End stage renal disease: Secondary | ICD-10-CM | POA: Diagnosis not present

## 2022-08-06 DIAGNOSIS — D509 Iron deficiency anemia, unspecified: Secondary | ICD-10-CM | POA: Diagnosis not present

## 2022-08-06 DIAGNOSIS — D631 Anemia in chronic kidney disease: Secondary | ICD-10-CM | POA: Diagnosis not present

## 2022-08-06 DIAGNOSIS — I959 Hypotension, unspecified: Secondary | ICD-10-CM | POA: Diagnosis not present

## 2022-08-06 DIAGNOSIS — E1129 Type 2 diabetes mellitus with other diabetic kidney complication: Secondary | ICD-10-CM | POA: Diagnosis not present

## 2022-08-06 DIAGNOSIS — Z992 Dependence on renal dialysis: Secondary | ICD-10-CM | POA: Diagnosis not present

## 2022-08-06 DIAGNOSIS — D689 Coagulation defect, unspecified: Secondary | ICD-10-CM | POA: Diagnosis not present

## 2022-08-08 ENCOUNTER — Ambulatory Visit: Payer: Self-pay

## 2022-08-08 DIAGNOSIS — D509 Iron deficiency anemia, unspecified: Secondary | ICD-10-CM | POA: Diagnosis not present

## 2022-08-08 DIAGNOSIS — N2581 Secondary hyperparathyroidism of renal origin: Secondary | ICD-10-CM | POA: Diagnosis not present

## 2022-08-08 DIAGNOSIS — E1129 Type 2 diabetes mellitus with other diabetic kidney complication: Secondary | ICD-10-CM | POA: Diagnosis not present

## 2022-08-08 DIAGNOSIS — D689 Coagulation defect, unspecified: Secondary | ICD-10-CM | POA: Diagnosis not present

## 2022-08-08 DIAGNOSIS — D631 Anemia in chronic kidney disease: Secondary | ICD-10-CM | POA: Diagnosis not present

## 2022-08-08 DIAGNOSIS — N186 End stage renal disease: Secondary | ICD-10-CM | POA: Diagnosis not present

## 2022-08-08 DIAGNOSIS — Z992 Dependence on renal dialysis: Secondary | ICD-10-CM | POA: Diagnosis not present

## 2022-08-08 NOTE — Patient Outreach (Signed)
  Care Coordination   08/08/2022 Name: LESLE FARON MRN: [id number] DOB: [birth year]   Care Coordination Outreach Attempts:  An unsuccessful telephone outreach was attempted for a scheduled appointment today.  Follow Up Plan:  Additional outreach attempts will be made to offer the patient care coordination information and services.   Encounter Outcome:  No Answer  Care Coordination Interventions Activated:  No   Care Coordination Interventions:  No, not indicated    Barb Merino, RN, BSN, CCM Care Management Coordinator [facility] Care Management Direct Phone: [phone]

## 2022-08-09 DIAGNOSIS — E559 Vitamin D deficiency, unspecified: Secondary | ICD-10-CM | POA: Diagnosis not present

## 2022-08-09 DIAGNOSIS — Z79899 Other long term (current) drug therapy: Secondary | ICD-10-CM | POA: Diagnosis not present

## 2022-08-10 DIAGNOSIS — E1129 Type 2 diabetes mellitus with other diabetic kidney complication: Secondary | ICD-10-CM | POA: Diagnosis not present

## 2022-08-10 DIAGNOSIS — D631 Anemia in chronic kidney disease: Secondary | ICD-10-CM | POA: Diagnosis not present

## 2022-08-10 DIAGNOSIS — D689 Coagulation defect, unspecified: Secondary | ICD-10-CM | POA: Diagnosis not present

## 2022-08-10 DIAGNOSIS — D509 Iron deficiency anemia, unspecified: Secondary | ICD-10-CM | POA: Diagnosis not present

## 2022-08-10 DIAGNOSIS — N186 End stage renal disease: Secondary | ICD-10-CM | POA: Diagnosis not present

## 2022-08-10 DIAGNOSIS — Z992 Dependence on renal dialysis: Secondary | ICD-10-CM | POA: Diagnosis not present

## 2022-08-10 DIAGNOSIS — N2581 Secondary hyperparathyroidism of renal origin: Secondary | ICD-10-CM | POA: Diagnosis not present

## 2022-08-12 DIAGNOSIS — G629 Polyneuropathy, unspecified: Secondary | ICD-10-CM | POA: Diagnosis not present

## 2022-08-12 DIAGNOSIS — H9312 Tinnitus, left ear: Secondary | ICD-10-CM | POA: Diagnosis not present

## 2022-08-12 DIAGNOSIS — N186 End stage renal disease: Secondary | ICD-10-CM | POA: Diagnosis not present

## 2022-08-12 DIAGNOSIS — E559 Vitamin D deficiency, unspecified: Secondary | ICD-10-CM | POA: Diagnosis not present

## 2022-08-13 DIAGNOSIS — Z992 Dependence on renal dialysis: Secondary | ICD-10-CM | POA: Diagnosis not present

## 2022-08-13 DIAGNOSIS — D631 Anemia in chronic kidney disease: Secondary | ICD-10-CM | POA: Diagnosis not present

## 2022-08-13 DIAGNOSIS — D509 Iron deficiency anemia, unspecified: Secondary | ICD-10-CM | POA: Diagnosis not present

## 2022-08-13 DIAGNOSIS — N2581 Secondary hyperparathyroidism of renal origin: Secondary | ICD-10-CM | POA: Diagnosis not present

## 2022-08-13 DIAGNOSIS — D689 Coagulation defect, unspecified: Secondary | ICD-10-CM | POA: Diagnosis not present

## 2022-08-13 DIAGNOSIS — N186 End stage renal disease: Secondary | ICD-10-CM | POA: Diagnosis not present

## 2022-08-13 DIAGNOSIS — E1129 Type 2 diabetes mellitus with other diabetic kidney complication: Secondary | ICD-10-CM | POA: Diagnosis not present

## 2022-08-15 ENCOUNTER — Ambulatory Visit: Payer: Self-pay

## 2022-08-15 DIAGNOSIS — E1129 Type 2 diabetes mellitus with other diabetic kidney complication: Secondary | ICD-10-CM | POA: Diagnosis not present

## 2022-08-15 DIAGNOSIS — N2581 Secondary hyperparathyroidism of renal origin: Secondary | ICD-10-CM | POA: Diagnosis not present

## 2022-08-15 DIAGNOSIS — N186 End stage renal disease: Secondary | ICD-10-CM | POA: Diagnosis not present

## 2022-08-15 DIAGNOSIS — D509 Iron deficiency anemia, unspecified: Secondary | ICD-10-CM | POA: Diagnosis not present

## 2022-08-15 DIAGNOSIS — Z992 Dependence on renal dialysis: Secondary | ICD-10-CM | POA: Diagnosis not present

## 2022-08-15 DIAGNOSIS — D631 Anemia in chronic kidney disease: Secondary | ICD-10-CM | POA: Diagnosis not present

## 2022-08-15 DIAGNOSIS — D689 Coagulation defect, unspecified: Secondary | ICD-10-CM | POA: Diagnosis not present

## 2022-08-15 NOTE — Patient Outreach (Signed)
  Care Coordination   08/15/2022 Name: Jacqueline [name] MRN: [id number] DOB: 1946/04/30   Care Coordination Outreach Attempts:  An unsuccessful telephone outreach was attempted for a scheduled appointment today.  Follow Up Plan:  No further outreach attempts will be made at this time. We have been unable to contact the patient to offer or enroll patient in care coordination services  Encounter Outcome:  No Answer  Care Coordination Interventions Activated:  No   Care Coordination Interventions:  No, not indicated    Barb Merino, RN, BSN, CCM Care Management Coordinator [place] Management Direct Phone: [phone]

## 2022-08-17 DIAGNOSIS — E1129 Type 2 diabetes mellitus with other diabetic kidney complication: Secondary | ICD-10-CM | POA: Diagnosis not present

## 2022-08-17 DIAGNOSIS — Z992 Dependence on renal dialysis: Secondary | ICD-10-CM | POA: Diagnosis not present

## 2022-08-17 DIAGNOSIS — N186 End stage renal disease: Secondary | ICD-10-CM | POA: Diagnosis not present

## 2022-08-17 DIAGNOSIS — D509 Iron deficiency anemia, unspecified: Secondary | ICD-10-CM | POA: Diagnosis not present

## 2022-08-17 DIAGNOSIS — D689 Coagulation defect, unspecified: Secondary | ICD-10-CM | POA: Diagnosis not present

## 2022-08-17 DIAGNOSIS — N2581 Secondary hyperparathyroidism of renal origin: Secondary | ICD-10-CM | POA: Diagnosis not present

## 2022-08-17 DIAGNOSIS — D631 Anemia in chronic kidney disease: Secondary | ICD-10-CM | POA: Diagnosis not present

## 2022-08-20 DIAGNOSIS — N186 End stage renal disease: Secondary | ICD-10-CM | POA: Diagnosis not present

## 2022-08-20 DIAGNOSIS — N2581 Secondary hyperparathyroidism of renal origin: Secondary | ICD-10-CM | POA: Diagnosis not present

## 2022-08-20 DIAGNOSIS — I959 Hypotension, unspecified: Secondary | ICD-10-CM | POA: Diagnosis not present

## 2022-08-20 DIAGNOSIS — E1129 Type 2 diabetes mellitus with other diabetic kidney complication: Secondary | ICD-10-CM | POA: Diagnosis not present

## 2022-08-20 DIAGNOSIS — D509 Iron deficiency anemia, unspecified: Secondary | ICD-10-CM | POA: Diagnosis not present

## 2022-08-20 DIAGNOSIS — D631 Anemia in chronic kidney disease: Secondary | ICD-10-CM | POA: Diagnosis not present

## 2022-08-20 DIAGNOSIS — Z992 Dependence on renal dialysis: Secondary | ICD-10-CM | POA: Diagnosis not present

## 2022-08-20 DIAGNOSIS — D689 Coagulation defect, unspecified: Secondary | ICD-10-CM | POA: Diagnosis not present

## 2022-08-22 ENCOUNTER — Telehealth: Payer: Self-pay

## 2022-08-22 DIAGNOSIS — D689 Coagulation defect, unspecified: Secondary | ICD-10-CM | POA: Diagnosis not present

## 2022-08-22 DIAGNOSIS — Z992 Dependence on renal dialysis: Secondary | ICD-10-CM | POA: Diagnosis not present

## 2022-08-22 DIAGNOSIS — D631 Anemia in chronic kidney disease: Secondary | ICD-10-CM | POA: Diagnosis not present

## 2022-08-22 DIAGNOSIS — N186 End stage renal disease: Secondary | ICD-10-CM | POA: Diagnosis not present

## 2022-08-22 DIAGNOSIS — N2581 Secondary hyperparathyroidism of renal origin: Secondary | ICD-10-CM | POA: Diagnosis not present

## 2022-08-22 DIAGNOSIS — E1129 Type 2 diabetes mellitus with other diabetic kidney complication: Secondary | ICD-10-CM | POA: Diagnosis not present

## 2022-08-22 DIAGNOSIS — D509 Iron deficiency anemia, unspecified: Secondary | ICD-10-CM | POA: Diagnosis not present

## 2022-08-22 NOTE — Telephone Encounter (Signed)
This nurse attempted to call patient three times for scheduled telephonic AWV. Message left that we will call her again to reschedule or she can call us.

## 2022-08-24 DIAGNOSIS — Z992 Dependence on renal dialysis: Secondary | ICD-10-CM | POA: Diagnosis not present

## 2022-08-24 DIAGNOSIS — D689 Coagulation defect, unspecified: Secondary | ICD-10-CM | POA: Diagnosis not present

## 2022-08-24 DIAGNOSIS — D631 Anemia in chronic kidney disease: Secondary | ICD-10-CM | POA: Diagnosis not present

## 2022-08-24 DIAGNOSIS — N2581 Secondary hyperparathyroidism of renal origin: Secondary | ICD-10-CM | POA: Diagnosis not present

## 2022-08-24 DIAGNOSIS — N186 End stage renal disease: Secondary | ICD-10-CM | POA: Diagnosis not present

## 2022-08-24 DIAGNOSIS — D509 Iron deficiency anemia, unspecified: Secondary | ICD-10-CM | POA: Diagnosis not present

## 2022-08-24 DIAGNOSIS — E1129 Type 2 diabetes mellitus with other diabetic kidney complication: Secondary | ICD-10-CM | POA: Diagnosis not present

## 2022-08-27 DIAGNOSIS — N186 End stage renal disease: Secondary | ICD-10-CM | POA: Diagnosis not present

## 2022-08-27 DIAGNOSIS — Z992 Dependence on renal dialysis: Secondary | ICD-10-CM | POA: Diagnosis not present

## 2022-08-27 DIAGNOSIS — D631 Anemia in chronic kidney disease: Secondary | ICD-10-CM | POA: Diagnosis not present

## 2022-08-27 DIAGNOSIS — D509 Iron deficiency anemia, unspecified: Secondary | ICD-10-CM | POA: Diagnosis not present

## 2022-08-27 DIAGNOSIS — N2581 Secondary hyperparathyroidism of renal origin: Secondary | ICD-10-CM | POA: Diagnosis not present

## 2022-08-27 DIAGNOSIS — E1129 Type 2 diabetes mellitus with other diabetic kidney complication: Secondary | ICD-10-CM | POA: Diagnosis not present

## 2022-08-27 DIAGNOSIS — D689 Coagulation defect, unspecified: Secondary | ICD-10-CM | POA: Diagnosis not present

## 2022-08-29 ENCOUNTER — Telehealth: Payer: Self-pay | Admitting: Nurse Practitioner

## 2022-08-29 DIAGNOSIS — D689 Coagulation defect, unspecified: Secondary | ICD-10-CM | POA: Diagnosis not present

## 2022-08-29 DIAGNOSIS — N2581 Secondary hyperparathyroidism of renal origin: Secondary | ICD-10-CM | POA: Diagnosis not present

## 2022-08-29 DIAGNOSIS — D509 Iron deficiency anemia, unspecified: Secondary | ICD-10-CM | POA: Diagnosis not present

## 2022-08-29 DIAGNOSIS — N186 End stage renal disease: Secondary | ICD-10-CM | POA: Diagnosis not present

## 2022-08-29 DIAGNOSIS — E1129 Type 2 diabetes mellitus with other diabetic kidney complication: Secondary | ICD-10-CM | POA: Diagnosis not present

## 2022-08-29 DIAGNOSIS — Z992 Dependence on renal dialysis: Secondary | ICD-10-CM | POA: Diagnosis not present

## 2022-08-29 DIAGNOSIS — D631 Anemia in chronic kidney disease: Secondary | ICD-10-CM | POA: Diagnosis not present

## 2022-08-29 NOTE — Telephone Encounter (Signed)
Left message for patient to call back and schedule Medicare Annual Wellness Visit (AWV) either virtually or in office. Left  my jabber number [phone]   Last AWV please schedule with Nurse Health Adviser   45 min for awv-i and in office appointments 30 min for awv-s  phone/virtual appointments   

## 2022-08-31 DIAGNOSIS — E1129 Type 2 diabetes mellitus with other diabetic kidney complication: Secondary | ICD-10-CM | POA: Diagnosis not present

## 2022-08-31 DIAGNOSIS — D631 Anemia in chronic kidney disease: Secondary | ICD-10-CM | POA: Diagnosis not present

## 2022-08-31 DIAGNOSIS — N2581 Secondary hyperparathyroidism of renal origin: Secondary | ICD-10-CM | POA: Diagnosis not present

## 2022-08-31 DIAGNOSIS — D689 Coagulation defect, unspecified: Secondary | ICD-10-CM | POA: Diagnosis not present

## 2022-08-31 DIAGNOSIS — Z992 Dependence on renal dialysis: Secondary | ICD-10-CM | POA: Diagnosis not present

## 2022-08-31 DIAGNOSIS — N186 End stage renal disease: Secondary | ICD-10-CM | POA: Diagnosis not present

## 2022-08-31 DIAGNOSIS — D509 Iron deficiency anemia, unspecified: Secondary | ICD-10-CM | POA: Diagnosis not present

## 2022-09-17 ENCOUNTER — Telehealth: Payer: Self-pay | Admitting: Nurse Practitioner

## 2022-09-17 ENCOUNTER — Telehealth: Payer: Self-pay

## 2022-09-17 NOTE — Telephone Encounter (Signed)
Left patient vm to give the office a call. Patient needs medicare AWV scheduled for this year.

## 2022-09-17 NOTE — Telephone Encounter (Signed)
Left message for patient to call back and schedule Medicare Annual Wellness Visit (AWV) either virtually or in office. Left  my jabber number [phone]   Last AWV 06/27/21 please schedule with Nurse Health Adviser   45 min for awv-i and in office appointments 30 min for awv-s  phone/virtual appointments  

## 2022-09-27 ENCOUNTER — Telehealth: Payer: Self-pay | Admitting: Cardiology

## 2022-09-27 NOTE — Telephone Encounter (Signed)
Spoke with Deska who is the nurse practitioner at [facility] where the patient currently resides. Answered questions about patient's medical history. She states that the patient has been having low heart rates and was wanting to know if this was part of her history. Confirmed that she has a history of bradycardia. She has no other questions or concerns at this time.

## 2022-09-27 NOTE — Telephone Encounter (Signed)
Deska, PA calling from [facility] requesting to speak with Dr. Theodosia Blender nurse. Phone: [phone]

## 2022-09-28 ENCOUNTER — Emergency Department: Payer: Medicare Other

## 2022-09-28 ENCOUNTER — Inpatient Hospital Stay
Admission: EM | Admit: 2022-09-28 | Discharge: 2022-10-02 | DRG: 640 | Disposition: A | Discharge status: Skilled Nursing Facility | Payer: Medicare Other | Source: Skilled Nursing Facility | Attending: Internal Medicine | Admitting: Internal Medicine

## 2022-09-28 ENCOUNTER — Other Ambulatory Visit: Payer: Self-pay

## 2022-09-28 DIAGNOSIS — Z79899 Other long term (current) drug therapy: Secondary | ICD-10-CM

## 2022-09-28 DIAGNOSIS — N2581 Secondary hyperparathyroidism of renal origin: Secondary | ICD-10-CM | POA: Diagnosis present

## 2022-09-28 DIAGNOSIS — I4821 Permanent atrial fibrillation: Secondary | ICD-10-CM | POA: Diagnosis present

## 2022-09-28 DIAGNOSIS — K5900 Constipation, unspecified: Secondary | ICD-10-CM | POA: Diagnosis present

## 2022-09-28 DIAGNOSIS — E1122 Type 2 diabetes mellitus with diabetic chronic kidney disease: Secondary | ICD-10-CM | POA: Diagnosis present

## 2022-09-28 DIAGNOSIS — R4182 Altered mental status, unspecified: Secondary | ICD-10-CM | POA: Diagnosis present

## 2022-09-28 DIAGNOSIS — R410 Disorientation, unspecified: Secondary | ICD-10-CM | POA: Diagnosis not present

## 2022-09-28 DIAGNOSIS — Z634 Disappearance and death of family member: Secondary | ICD-10-CM

## 2022-09-28 DIAGNOSIS — E1151 Type 2 diabetes mellitus with diabetic peripheral angiopathy without gangrene: Secondary | ICD-10-CM | POA: Diagnosis present

## 2022-09-28 DIAGNOSIS — E875 Hyperkalemia: Secondary | ICD-10-CM | POA: Diagnosis not present

## 2022-09-28 DIAGNOSIS — D631 Anemia in chronic kidney disease: Secondary | ICD-10-CM | POA: Diagnosis present

## 2022-09-28 DIAGNOSIS — Z1152 Encounter for screening for COVID-19: Secondary | ICD-10-CM

## 2022-09-28 DIAGNOSIS — I4892 Unspecified atrial flutter: Secondary | ICD-10-CM | POA: Diagnosis present

## 2022-09-28 DIAGNOSIS — I4891 Unspecified atrial fibrillation: Secondary | ICD-10-CM | POA: Diagnosis not present

## 2022-09-28 DIAGNOSIS — Z992 Dependence on renal dialysis: Secondary | ICD-10-CM

## 2022-09-28 DIAGNOSIS — Z833 Family history of diabetes mellitus: Secondary | ICD-10-CM

## 2022-09-28 DIAGNOSIS — N186 End stage renal disease: Secondary | ICD-10-CM | POA: Diagnosis not present

## 2022-09-28 DIAGNOSIS — Z8249 Family history of ischemic heart disease and other diseases of the circulatory system: Secondary | ICD-10-CM

## 2022-09-28 DIAGNOSIS — I959 Hypotension, unspecified: Secondary | ICD-10-CM | POA: Diagnosis not present

## 2022-09-28 DIAGNOSIS — K219 Gastro-esophageal reflux disease without esophagitis: Secondary | ICD-10-CM | POA: Diagnosis present

## 2022-09-28 DIAGNOSIS — I252 Old myocardial infarction: Secondary | ICD-10-CM

## 2022-09-28 DIAGNOSIS — I2510 Atherosclerotic heart disease of native coronary artery without angina pectoris: Secondary | ICD-10-CM | POA: Diagnosis present

## 2022-09-28 DIAGNOSIS — M545 Low back pain, unspecified: Secondary | ICD-10-CM | POA: Diagnosis present

## 2022-09-28 DIAGNOSIS — G8929 Other chronic pain: Secondary | ICD-10-CM | POA: Diagnosis present

## 2022-09-28 DIAGNOSIS — I132 Hypertensive heart and chronic kidney disease with heart failure and with stage 5 chronic kidney disease, or end stage renal disease: Secondary | ICD-10-CM | POA: Diagnosis present

## 2022-09-28 DIAGNOSIS — Z7901 Long term (current) use of anticoagulants: Secondary | ICD-10-CM

## 2022-09-28 DIAGNOSIS — Z90710 Acquired absence of both cervix and uterus: Secondary | ICD-10-CM

## 2022-09-28 DIAGNOSIS — E785 Hyperlipidemia, unspecified: Secondary | ICD-10-CM | POA: Diagnosis present

## 2022-09-28 DIAGNOSIS — I9589 Other hypotension: Secondary | ICD-10-CM | POA: Diagnosis present

## 2022-09-28 DIAGNOSIS — Z6841 Body Mass Index (BMI) 40.0 and over, adult: Secondary | ICD-10-CM

## 2022-09-28 DIAGNOSIS — K5790 Diverticulosis of intestine, part unspecified, without perforation or abscess without bleeding: Secondary | ICD-10-CM | POA: Diagnosis present

## 2022-09-28 DIAGNOSIS — Z955 Presence of coronary angioplasty implant and graft: Secondary | ICD-10-CM

## 2022-09-28 DIAGNOSIS — Z7982 Long term (current) use of aspirin: Secondary | ICD-10-CM

## 2022-09-28 DIAGNOSIS — Z87891 Personal history of nicotine dependence: Secondary | ICD-10-CM

## 2022-09-28 DIAGNOSIS — M1712 Unilateral primary osteoarthritis, left knee: Secondary | ICD-10-CM | POA: Diagnosis present

## 2022-09-28 LAB — COMPREHENSIVE METABOLIC PANEL
ALT: 23 U/L (ref 0–44)
AST: 32 U/L (ref 15–41)
Albumin: 3.7 g/dL (ref 3.5–5.0)
Alkaline Phosphatase: 83 U/L (ref 38–126)
Anion gap: 13 (ref 5–15)
BUN: 57 mg/dL — ABNORMAL HIGH (ref 8–23)
CO2: 27 mmol/L (ref 22–32)
Calcium: 8.9 mg/dL (ref 8.9–10.3)
Chloride: 94 mmol/L — ABNORMAL LOW (ref 98–111)
Creatinine, Ser: 7.07 mg/dL — ABNORMAL HIGH (ref 0.44–1.00)
GFR, Estimated: 6 mL/min — ABNORMAL LOW (ref >60)
Glucose, Bld: 94 mg/dL (ref 70–99)
Potassium: 5.4 mmol/L — ABNORMAL HIGH (ref 3.5–5.1)
Sodium: 134 mmol/L — ABNORMAL LOW (ref 135–145)
Total Bilirubin: 0.7 mg/dL (ref 0.3–1.2)
Total Protein: 8 g/dL (ref 6.5–8.1)

## 2022-09-28 LAB — CBC WITH DIFFERENTIAL/PLATELET
Abs Immature Granulocytes: 0.02 10*3/uL (ref 0.00–0.07)
Basophils Absolute: 0.1 10*3/uL (ref 0.0–0.1)
Basophils Relative: 1 %
Eosinophils Absolute: 0.1 10*3/uL (ref 0.0–0.5)
Eosinophils Relative: 1 %
HCT: 37.3 % (ref 36.0–46.0)
Hemoglobin: 11.3 g/dL — ABNORMAL LOW (ref 12.0–15.0)
Immature Granulocytes: 0 %
Lymphocytes Relative: 26 %
Lymphs Abs: 2.7 10*3/uL (ref 0.7–4.0)
MCH: 31 pg (ref 26.0–34.0)
MCHC: 30.3 g/dL (ref 30.0–36.0)
MCV: 102.5 fL — ABNORMAL HIGH (ref 80.0–100.0)
Monocytes Absolute: 1 10*3/uL (ref 0.1–1.0)
Monocytes Relative: 10 %
Neutro Abs: 6.2 10*3/uL (ref 1.7–7.7)
Neutrophils Relative %: 62 %
Platelets: 157 10*3/uL (ref 150–400)
RBC: 3.64 MIL/uL — ABNORMAL LOW (ref 3.87–5.11)
RDW: 14.6 % (ref 11.5–15.5)
WBC: 10 10*3/uL (ref 4.0–10.5)
nRBC: 0 % (ref 0.0–0.2)

## 2022-09-28 LAB — GLUCOSE, CAPILLARY: Glucose-Capillary: 148 mg/dL — ABNORMAL HIGH (ref 70–99)

## 2022-09-28 LAB — MAGNESIUM: Magnesium: 2 mg/dL (ref 1.7–2.4)

## 2022-09-28 LAB — TROPONIN I (HIGH SENSITIVITY)
Troponin I (High Sensitivity): 33 ng/L — ABNORMAL HIGH (ref <18)
Troponin I (High Sensitivity): 34 ng/L — ABNORMAL HIGH (ref <18)

## 2022-09-28 LAB — RESP PANEL BY RT-PCR (FLU A&B, COVID) ARPGX2
Influenza A by PCR: NEGATIVE
Influenza B by PCR: NEGATIVE
SARS Coronavirus 2 by RT PCR: NEGATIVE

## 2022-09-28 LAB — TSH: TSH: 3.308 u[IU]/mL (ref 0.350–4.500)

## 2022-09-28 MED ORDER — ALBUTEROL SULFATE (2.5 MG/3ML) 0.083% IN NEBU: 3.0000 mL | INHALATION_SOLUTION | Freq: Four times a day (QID) | RESPIRATORY_TRACT | Status: DC | PRN

## 2022-09-28 MED ORDER — EZETIMIBE 10 MG PO TABS
10.0000 mg | ORAL_TABLET | Freq: Every day | ORAL | Status: DC
  Administered 2022-09-29 – 2022-10-02 (×4): 10 mg via ORAL
  Filled 2022-09-28 (×4): qty 1

## 2022-09-28 MED ORDER — FERRIC CITRATE 1 GM 210 MG(FE) PO TABS
420.0000 mg | ORAL_TABLET | Freq: Three times a day (TID) | ORAL | Status: DC
  Administered 2022-09-28 – 2022-10-02 (×9): 420 mg via ORAL
  Filled 2022-09-28 (×12): qty 2

## 2022-09-28 MED ORDER — APIXABAN 2.5 MG PO TABS
2.5000 mg | ORAL_TABLET | Freq: Two times a day (BID) | ORAL | Status: DC
  Administered 2022-09-28 – 2022-09-30 (×5): 2.5 mg via ORAL
  Filled 2022-09-28 (×5): qty 1

## 2022-09-28 MED ORDER — RENA-VITE PO TABS
1.0000 | ORAL_TABLET | Freq: Every day | ORAL | Status: DC
  Administered 2022-09-29 – 2022-10-02 (×3): 1 via ORAL
  Filled 2022-09-28 (×4): qty 1

## 2022-09-28 MED ORDER — HEPARIN SODIUM (PORCINE) 5000 UNIT/ML IJ SOLN: 5000.0000 [IU] | Freq: Three times a day (TID) | INTRAMUSCULAR | Status: DC

## 2022-09-28 MED ORDER — SODIUM ZIRCONIUM CYCLOSILICATE 10 G PO PACK
10.0000 g | PACK | Freq: Every day | ORAL | Status: DC
  Administered 2022-09-28 – 2022-10-02 (×5): 10 g via ORAL
  Filled 2022-09-28 (×5): qty 1

## 2022-09-28 MED ORDER — ROSUVASTATIN CALCIUM 10 MG PO TABS
40.0000 mg | ORAL_TABLET | Freq: Every day | ORAL | Status: DC
  Administered 2022-09-29: 40 mg via ORAL
  Filled 2022-09-28: qty 4

## 2022-09-28 MED ORDER — CINACALCET HCL 30 MG PO TABS
30.0000 mg | ORAL_TABLET | Freq: Every day | ORAL | Status: DC
  Administered 2022-09-29 – 2022-10-01 (×3): 30 mg via ORAL
  Filled 2022-09-28 (×4): qty 1

## 2022-09-28 MED ORDER — ONDANSETRON HCL 4 MG PO TABS: 4.0000 mg | ORAL_TABLET | Freq: Four times a day (QID) | ORAL | Status: DC | PRN

## 2022-09-28 MED ORDER — PANTOPRAZOLE SODIUM 40 MG PO TBEC
40.0000 mg | DELAYED_RELEASE_TABLET | Freq: Every day | ORAL | Status: DC
  Administered 2022-09-29 – 2022-10-02 (×4): 40 mg via ORAL
  Filled 2022-09-28 (×4): qty 1

## 2022-09-28 MED ORDER — ACETAMINOPHEN 325 MG PO TABS
650.0000 mg | ORAL_TABLET | Freq: Four times a day (QID) | ORAL | Status: DC | PRN
  Administered 2022-09-30: 650 mg via ORAL
  Filled 2022-09-28: qty 2

## 2022-09-28 MED ORDER — VITAMIN D 25 MCG (1000 UNIT) PO TABS
2000.0000 [IU] | ORAL_TABLET | Freq: Every day | ORAL | Status: DC
  Administered 2022-09-29 – 2022-10-02 (×4): 2000 [IU] via ORAL
  Filled 2022-09-28 (×4): qty 2

## 2022-09-28 MED ORDER — ASPIRIN 81 MG PO TBEC
81.0000 mg | DELAYED_RELEASE_TABLET | Freq: Every day | ORAL | Status: DC
  Administered 2022-09-28 – 2022-09-30 (×3): 81 mg via ORAL
  Filled 2022-09-28 (×3): qty 1

## 2022-09-28 MED ORDER — MIDODRINE HCL 5 MG PO TABS
10.0000 mg | ORAL_TABLET | ORAL | Status: DC
  Administered 2022-09-28 – 2022-10-01 (×3): 10 mg via ORAL
  Filled 2022-09-28 (×3): qty 2

## 2022-09-28 MED ORDER — MIDODRINE HCL 5 MG PO TABS
10.0000 mg | ORAL_TABLET | Freq: Once | ORAL | Status: AC
  Administered 2022-09-28: 10 mg via ORAL
  Filled 2022-09-28: qty 2

## 2022-09-28 MED ORDER — ONDANSETRON HCL 4 MG/2ML IJ SOLN
4.0000 mg | Freq: Four times a day (QID) | INTRAMUSCULAR | Status: DC | PRN
  Administered 2022-09-30: 4 mg via INTRAVENOUS
  Filled 2022-09-28: qty 2

## 2022-09-28 MED ORDER — ACETAMINOPHEN 500 MG PO TABS: 1000.0000 mg | ORAL_TABLET | Freq: Four times a day (QID) | ORAL | Status: DC | PRN

## 2022-09-28 MED ORDER — ACETAMINOPHEN 650 MG RE SUPP: 650.0000 mg | Freq: Four times a day (QID) | RECTAL | Status: DC | PRN

## 2022-09-28 MED ORDER — ONDANSETRON HCL 4 MG/2ML IJ SOLN
4.0000 mg | Freq: Once | INTRAMUSCULAR | Status: AC
  Administered 2022-09-28: 4 mg via INTRAVENOUS
  Filled 2022-09-28: qty 2

## 2022-09-28 NOTE — Assessment & Plan Note (Signed)
Dialysis days are T/TH/S Patient does not show any signs and symptoms of fluid overload and has mild hyperkalemia of 5.4 No indication for urgent renal replacement at this time We will consult nephrology

## 2022-09-28 NOTE — ED Notes (Signed)
Lab contacted to determine why 2nd troponin not in process. States will work on it

## 2022-09-28 NOTE — ED Triage Notes (Signed)
Pt to ED [facility] from [facility] for [facility]. Pt answering all orientation questions appropriately.  Pt supposed to get dialysis today, [facility] did not send d/t low bp, bp 542 systolic per EMS. Last dialysis on Thursday  Pt emesis on arrival.

## 2022-09-28 NOTE — ED Provider Notes (Addendum)
[facility] Provider Note    Event Date/Time   First MD Initiated Contact with Patient 09/28/22 1027     (approximate)   History   Emesis   HPI  Jacqueline [name] is a 76 y.o. 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  Patient had a witnessed episode at her nursing facility today where she was unresponsive.  Patient's sister/family members were on the telephone with her whenever this episode was occurring.  Patient did not miss any dialysis sessions and last went to dialysis on Thursday.  Prior to going to dialysis today she had an episode where she passed out, was unable to hold up her head and had generalized weakness with trouble with her speech.  They checked her blood pressure and it was significantly low in the 50s during that time.  States that she was then staring off into space.  When EMS arrived patient's blood pressure was normal and she was stable on room air.  Patient did not receive any dialysis today secondary to her low blood pressure.  States she was in her normal state of health yesterday.  States she is feeling much better now.  Does endorse having intermittent episodes of her blood pressure dropping especially on dialysis days and she has been getting midodrine.  Denies missing any medications.  Does not make any urine.  Denies any chest pain or shortness of breath.      Physical Exam   Triage Vital Signs: ED Triage Vitals  Enc Vitals Group     BP 09/28/22 1026 (!) 138/49     Pulse Rate 09/28/22 1026 97     Resp 09/28/22 1026 20     Temp 09/28/22 1026 97.9 F (36.6 C)     Temp Source 09/28/22 1026 Oral     SpO2 09/28/22 1026 97 %     Weight 09/28/22 1031 239 lb (108.4 kg)     Height --      Head Circumference --      Peak Flow --      Pain Score 09/28/22 1014 0     Pain Loc --      Pain Edu? --      Excl. in [place]? --     Most  recent vital signs: Vitals:   09/28/22 1300 09/28/22 1309  BP: (!) 85/24 (!) 98/48  Pulse: 63 (!) 53  Resp: 15 13  Temp:    SpO2: 97% 100%    Physical Exam Constitutional:      Appearance: She is well-developed.  HENT:     Head: Atraumatic.  Eyes:     Conjunctiva/sclera: Conjunctivae normal.  Cardiovascular:     Rate and Rhythm: Regular rhythm.  Pulmonary:     Effort: No respiratory distress.  Abdominal:     General: There is no distension.  Musculoskeletal:        General: Normal range of motion.     Cervical back: Normal range of motion.  Skin:    General: Skin is warm.     Comments: Left AV fistula with a palpable thrill  Neurological:     Mental Status: She is alert. Mental status is at baseline.     Comments: Following commands, moving all extremities          IMPRESSION / MDM / ASSESSMENT AND PLAN / ED COURSE  I reviewed the triage vital signs and  the nursing notes.  Differential diagnosis includes infectious process, electrolyte abnormality, dysrhythmia, ACS, seizure, intracranial hemorrhage, pulmonary embolism.     EKG  My interpretation of EKG was read as atrial fibrillation however questionable P waves present.  QTc 470.  No significant ST elevation or depression.  Sinus bradycardia on cardiac telemetry.  Will obtain repeat EKG.  Repeat EKG reading is atrial fibrillation.  QTc 465.  QRS 100.  RADIOLOGY I independently reviewed imaging, my interpretation of imaging: CT scan of the head and chest x-ray.  CT scan of the head shows no signs of intracranial hemorrhage.  No focal findings concerning for pneumonia.  CT scan of the head was read as no acute findings.  Chest x-ray without focal findings consistent with pneumonia.    ED Results / Procedures / Treatments   Labs (all labs ordered are listed, but only abnormal results are displayed) Labs interpreted as -  Patient with elevated troponin which is most likely secondary to her ESRD.  Anemia  but no significant change when compared to her baseline.  Creatinine is at her baseline.  No findings for emergent dialysis.  Labs Reviewed  CBC WITH DIFFERENTIAL/PLATELET - Abnormal; Notable for the following components:      Result Value   RBC 3.64 (*)    Hemoglobin 11.3 (*)    MCV 102.5 (*)    All other components within normal limits  COMPREHENSIVE METABOLIC PANEL - Abnormal; Notable for the following components:   Sodium 134 (*)    Potassium 5.4 (*)    Chloride 94 (*)    BUN 57 (*)    Creatinine, Ser 7.07 (*)    GFR, Estimated 6 (*)    All other components within normal limits  TROPONIN I (HIGH SENSITIVITY) - Abnormal; Notable for the following components:   Troponin I (High Sensitivity) 33 (*)    All other components within normal limits  TROPONIN I (HIGH SENSITIVITY) - Abnormal; Notable for the following components:   Troponin I (High Sensitivity) 34 (*)    All other components within normal limits  RESP PANEL BY RT-PCR (FLU A&B, COVID) ARPGX2  MAGNESIUM   Called into the patient's room because patient had a drop of her blood pressure.  Noted that her systolic blood pressure was in the 70s.  Repeat blood pressure is slowly came up to his baseline.  Patient was asymptomatic during this time.  Patient given her home midodrine.  Given recurrent episodes of hypotension without a clear source patient will be admitted to the hospitalist.  On chart review patient is on home midodrine.  Did give her 10 mg dose of midodrine which she is supposed to take on dialysis days.  Patient did not receive any dialysis today.  No obvious source of an infectious process.  Chest pain-free at this time and given her chronically elevated troponin we will hold off on any further treatment for NSTEMI at this time.      PROCEDURES:  Critical Care performed: Yes  Procedures Total critical care time: 45 minutes  Critical care time was exclusive of separately billable procedures and treating other  patients.  Critical care was necessary to treat or prevent imminent or life-threatening deterioration.  Critical care was time spent personally by me on the following activities: development of treatment plan with patient and/or surrogate as well as nursing, discussions with consultants, evaluation of patient's response to treatment, examination of patient, obtaining history from patient or surrogate, ordering and performing treatments and interventions, ordering  and review of laboratory studies, ordering and review of radiographic studies, pulse oximetry and re-evaluation of patient's condition.   Patient's presentation is most consistent with acute presentation with potential threat to life or bodily function.   MEDICATIONS ORDERED IN ED: Medications  ondansetron (ZOFRAN) injection 4 mg (4 mg Intravenous Given 09/28/22 1034)  midodrine (PROAMATINE) tablet 10 mg (10 mg Oral Given 09/28/22 1322)    FINAL CLINICAL IMPRESSION(S) / ED DIAGNOSES   Final diagnoses:  Hypotension, unspecified hypotension type     Rx / DC Orders   ED Discharge Orders     None        Note:  This document was prepared using Dragon voice recognition software and may include unintentional dictation errors.   Nathaniel Man, MD 09/28/22 1325    Nathaniel Man, MD 09/28/22 1351

## 2022-09-28 NOTE — Assessment & Plan Note (Signed)
Continue Crestor and aspirin

## 2022-09-28 NOTE — ED Notes (Signed)
Dr Jori Moll notified of bp, orders to be placed as needed

## 2022-09-28 NOTE — ED Notes (Addendum)
BP cuffed switched to appropriate size. BP reading 104/43

## 2022-09-28 NOTE — Assessment & Plan Note (Addendum)
Patient with history of hypertension and usually receives midodrine prior to dialysis. Had an episode of decreased responsiveness morning prior to to going to last dialysis and was noted to be hypotensive. She received a dose of midodrine in the ER with improvement in her blood pressure but now remains hypotensive. Continue scheduled midodrine on dialysis days

## 2022-09-28 NOTE — Assessment & Plan Note (Addendum)
Appears to be new onset Patient noted to have a slow ventricular response Patient has a CHA2DS2-VASc score of 4 and started on Eliquis for long-term anticoagulation as primary prophylaxis for an acute stroke 2D echo shows normal LV systolic function Cardiology following Continue apixaban 2.5 mg p.o. twice daily

## 2022-09-28 NOTE — Assessment & Plan Note (Signed)
Patient was said to have an episode of unresponsiveness which was transient and related to hypotension.   No documented loss of consciousness She is back to her baseline mental status Continue midodrine to maintain systolic blood pressure greater than 158mmHg We will monitor patient closely

## 2022-09-28 NOTE — ED Notes (Signed)
This RN contacted [facility] to confirm chief complaint. Staff reports pt has been receiving prn midodrine for hypotension. States today she became weak and was not answering questions, was drifting in and out of consciousness and rolling neck around.

## 2022-09-28 NOTE — Consult Note (Signed)
Reviewed chart, Patient with transient hypotension, and unresponsiveness, but now back to baseline with afib with controlled rate.Advise elliquis 2.5 bid, and observe. Get echo.

## 2022-09-28 NOTE — H&P (Addendum)
History and Physical    Patient: Jacqueline [name] AOZ:[id number] DOB: 01/11/1946 DOA: 09/28/2022 DOS: the patient was seen and examined on 09/28/2022 PCP: [name], [place]  Patient coming from: SNF  Chief Complaint:  Chief Complaint  Patient presents with   Emesis   History obtained from EMR and patient's son at the bedside as patient does not know why she is here HPI: Jacqueline [name] is a 76 y.o. female with medical history significant for hypertension, end-stage renal disease secondary to diabetes mellitus (dialysis days are T/TH/S), coronary artery disease status post PCI with stent angioplasty, chronic low back pain, morbid obesity, GERD, dyslipidemia who was brought into the ER from the skilled nursing facility where she resides for evaluation of change in mental status. Per nursing home staff patient did not go to dialysis today because she was noted to be hypotensive with decreased responsiveness and increased weakness.  Her son states that she was on the phone with her sister and that her speech was slurred and she was not as responsive as usual and the nursing home staff told her sister that her blood pressure was low and so EMS was called.  Per ER providers note blood pressure was 50 systolic.  Repeat blood pressure checked by EMS was 962 systolic. Chart review shows that the nurse practitioner at the nursing home where the patient resides had called her primary care provider 1 day prior to this admission because patient had episodes of bradycardia. She had an episode of emesis when she arrived to ER but that has resolved She denies having any chest pain, no abdominal pain, no changes in her bowel habits, no headache, no dizziness, no lightheadedness, no blurred vision, no focal deficit. Labs show slightly elevated potassium levels of 5.4 Twelve-lead EKG shows A-fib with a slow ventricular rate which appears to be new onset.   Review of Systems: As mentioned in the history of  present illness. All other systems reviewed and are negative. Past Medical History:  Diagnosis Date   Arthritis    "left knee" (12/04/2016)   CAD (coronary artery disease)    a. 12/2016 NSTEMI/PCI: LM nl, LaD 20p/d, D1 20, OM2 20, OM3 20, RCA 37m (3.0x18 Resolute DES). EF 65%.   Chronic lower back pain    Diverticulosis    on CT   ESRD on dialysis [facility])    "[place]; [place]; TTS" (12/04/2016)   GERD (gastroesophageal reflux disease)    Hiatal hernia    Hyperlipidemia    Hypertension    a. 12/2016 labetalol d/c'd 2/2 hypotension in HD.   Morbid obesity ([place])    Obesity    Pneumonia    "couple times" (12/04/2016)   Renal insufficiency    Type II diabetes mellitus ([place])    Past Surgical History:  Procedure Laterality Date   ABDOMINAL HYSTERECTOMY     AV FISTULA PLACEMENT Left 01/27/2014   Procedure: ARTERIOVENOUS (AV) FISTULA CREATION;  Surgeon: Mal Misty, MD;  Location: [facility];  Service: Vascular;  Laterality: Left;   AV FISTULA REPAIR     "had it cleaned out"   CARDIAC CATHETERIZATION  ?[place] N/A 12/04/2016   Procedure: Left Heart Cath Angiography;  Surgeon: Burnell Blanks, MD;  Location: [place] CV LAB;  Service: Cardiovascular;  Laterality: N/A;   CARDIAC CATHETERIZATION N/A 12/04/2016   Procedure: Coronary Stent Intervention;  Surgeon: Burnell Blanks, MD;  Location: [place] CV LAB;  Service: Cardiovascular;  Laterality:  N/A;  Mid RCA   CATARACT EXTRACTION W/ INTRAOCULAR LENS IMPLANT Left    COLONOSCOPY W/ BIOPSIES AND POLYPECTOMY     CORONARY ANGIOPLASTY WITH STENT PLACEMENT  12/04/2016   ESOPHAGOGASTRODUODENOSCOPY     INSERTION OF DIALYSIS CATHETER N/A 01/19/2014   Procedure: INSERTION OF DIALYSIS CATHETER;  Surgeon: Rosetta Posner, MD;  Location: [facility];  Service: Vascular;  Laterality: N/A;   Social History:  reports that she quit smoking about 38 years ago. Her smoking use included cigarettes. She has a 20.00  pack-year smoking history. She has never used smokeless tobacco. She reports that she does not drink alcohol and does not use drugs.  Allergies  Allergen Reactions   Other Other (See Comments)   Sulfa Antibiotics Other (See Comments)   Sulfa Drugs Cross Reactors Other (See Comments)    Doesn't remember     Family History  Problem Relation Age of Onset   Diabetes Mother    Peripheral vascular disease Mother        amputation   CAD Father    Heart disease Father    Hypertension Father    Heart attack Father    CAD Sister    Cancer Brother        x 2 brothers, type unknown   Breast cancer Sister     Prior to Admission medications   Medication Sig Start Date End Date Taking? Authorizing Provider  acetaminophen (TYLENOL) 500 MG tablet Take 1,000 mg by mouth every 6 (six) hours as needed for moderate pain or headache.    [provider]  albuterol (VENTOLIN HFA) 108 (90 Base) MCG/ACT inhaler Inhale 1-2 puffs into the lungs every 6 (six) hours as needed for wheezing or shortness of breath. 03/04/46   Lianne Cure, DO  aspirin EC 81 MG EC tablet Take 1 tablet (81 mg total) by mouth daily. 12/06/16   Geradine Girt, DO  AURYXIA 1 GM 210 MG(Fe) tablet Take 420 mg by mouth 2 (two) times a day. 06/08/19   [provider]  B Complex-C-Zn-Folic Acid (DIALYVITE/ZINC) TABS Take 1 tablet by mouth daily. 02/09/22   [provider]  cinacalcet (SENSIPAR) 30 MG tablet Take 30 mg by mouth daily. 08/23/21   [provider]  ezetimibe (ZETIA) 10 MG tablet Take 1 tablet (10 mg total) by mouth daily. 04/30/22   Wellington Hampshire, MD  midodrine (PROAMATINE) 10 MG tablet Take 10 mg by mouth See admin instructions. Dialysis days [place] Saturday    [provider]  Omega-3 Fatty Acids (FISH OIL) 1000 MG CPDR Take 1,000 mg by mouth daily.  Patient not taking: Reported on 04/26/2022    [provider]  omeprazole (PRILOSEC) 20 MG capsule Take 1 capsule (20  mg total) by mouth daily. 02/21/22   [name], FNP  rosuvastatin (CRESTOR) 40 MG tablet TAKE 1 TABLET BY MOUTH DAILY 04/30/22   Wellington Hampshire, MD  Vitamin D, Ergocalciferol, 50 MCG (2000 UT) CAPS Take 2,000 Units by mouth daily.    [provider]    Physical Exam: Vitals:   09/28/22 1309 09/28/22 1400 09/28/22 1422 09/28/22 1430  BP: (!) 98/48 (!) 100/42  (!) 110/58  Pulse: (!) 53 (!) 54  (!) 50  Resp: 13     Temp:   98.2 F (36.8 C)   TempSrc:   Oral   SpO2: 100% 96%  93%  Weight:       Physical Exam Vitals and nursing note  reviewed.  Constitutional:      Appearance: She is obese.     Comments: Oriented to person, place and time  HENT:     Head: Normocephalic and atraumatic.     Nose: Nose normal.     Mouth/Throat:     Mouth: Mucous membranes are moist.  Eyes:     Comments: Pale conjunctiva  Cardiovascular:     Rate and Rhythm: Normal rate and regular rhythm.  Abdominal:     General: Bowel sounds are normal.     Palpations: Abdomen is soft.     Comments: Central adiposity  Musculoskeletal:        General: Normal range of motion.     Cervical back: Normal range of motion and neck supple.  Skin:    General: Skin is warm and dry.  Neurological:     Mental Status: She is alert.     Motor: Weakness present.  Psychiatric:        Mood and Affect: Mood normal.        Behavior: Behavior normal.     Data Reviewed: Relevant notes from primary care and specialist visits, past discharge summaries as available in EHR, including Care Everywhere. Prior diagnostic testing as pertinent to current admission diagnoses Updated medications and problem lists for reconciliation ED course, including vitals, labs, imaging, treatment and response to treatment Triage notes, nursing and pharmacy notes and ED provider's notes Notable results as noted in HPI Labs reviewed.  Troponin 33 >> 34, sodium 134, potassium 5.4, chloride 94, bicarb 27, glucose 94, BUN 57, creatinine  7.07, calcium 8.9, total protein 8.0, albumin 3.7, AST 32, ALT 23, alkaline phosphatase 83, total bilirubin 0.7, aneurysm 2.0, white count 10.0, hemoglobin 11.3, hematocrit 37.3, platelet count 157 Thyroid viral panel is negative Chest x-ray reviewed by me shows mild enlargement of the cardiopericardial silhouette with pulmonary venous hypertension but no overt edema.Airway thickening is present, suggesting bronchitis or reactive airways disease. Thoracic spondylosis. Atherosclerotic calcification of the aortic arch. CT scan of the head without contrast shows mild atrophy and white matter disease likely reflects the sequela of chronic microvascular ischemia. No acute intracranial abnormality. Chronic right maxillary sinus disease. Small mastoid effusions bilaterally. No obstructing nasopharyngeal lesion is present. Twelve-lead EKG reviewed by me shows A-fib with a slow ventricular rate There are no new results to review at this time.  Assessment and Plan: * AMS (altered mental status) Patient was said to have an episode of unresponsiveness which was transient and related to hypotension.   No documented loss of consciousness She is back to her baseline mental status Continue midodrine to maintain systolic blood pressure greater than 156mmHg We will monitor patient closely  Hypotension Patient with history of hypertension and usually receives midodrine prior to dialysis. Had an episode of decreased responsiveness this morning prior to to going to dialysis and was noted to be hypotensive. She received a dose of midodrine in the ER with improvement in her blood pressure but had another episode of hypotension. Continue scheduled midodrine on dialysis days   Atrial fibrillation with slow ventricular response (HCC) Appears to be new onset Patient noted to have a slow ventricular response Patient has a CHA2DS2-VASc score of 4 and will require anticoagulation as primary prophylaxis for an acute  stroke Obtain 2D echocardiogram to assess LVEF and rule out valvular pathology Cardiology consult  Start patient on apixaban 2.5 mg p.o. twice daily  CAD (coronary artery disease) Continue Crestor and aspirin  ESRD on dialysis ([place])  Dialysis days are T/TH/S Patient does not show any signs and symptoms of fluid overload and has mild hyperkalemia of 5.4 No indication for urgent renal replacement at this time We will consult nephrology        Advance Care Planning:   Code Status: Full Code   Consults: Nephrology, cardiology  Family Communication: Greater than 50% of time was spent discussing patient's condition and plan of care with her son and sister at the bedside.  All questions and concerns have been addressed.  They verbalized understanding and agree with the plan.  Severity of Illness: The appropriate patient status for this patient is OBSERVATION. Observation status is judged to be reasonable and necessary in order to provide the required intensity of service to ensure the patient's safety. The patient's presenting symptoms, physical exam findings, and initial radiographic and laboratory data in the context of their medical condition is felt to place them at decreased risk for further clinical deterioration. Furthermore, it is anticipated that the patient will be medically stable for discharge from the hospital within 2 midnights of admission.   Author: Collier Bullock, MD 09/28/2022 3:27 PM  For on call review [email or web address].

## 2022-09-29 ENCOUNTER — Observation Stay (HOSPITAL_COMMUNITY)
Admit: 2022-09-29 | Discharge: 2022-09-29 | Disposition: A | Discharge status: Home / Self Care | Payer: Medicare Other | Attending: Internal Medicine | Admitting: Internal Medicine

## 2022-09-29 ENCOUNTER — Observation Stay: Payer: Medicare Other

## 2022-09-29 DIAGNOSIS — I5031 Acute diastolic (congestive) heart failure: Secondary | ICD-10-CM

## 2022-09-29 DIAGNOSIS — G459 Transient cerebral ischemic attack, unspecified: Secondary | ICD-10-CM

## 2022-09-29 DIAGNOSIS — E875 Hyperkalemia: Principal | ICD-10-CM

## 2022-09-29 DIAGNOSIS — N186 End stage renal disease: Secondary | ICD-10-CM | POA: Diagnosis not present

## 2022-09-29 DIAGNOSIS — R410 Disorientation, unspecified: Secondary | ICD-10-CM | POA: Diagnosis not present

## 2022-09-29 DIAGNOSIS — I4891 Unspecified atrial fibrillation: Secondary | ICD-10-CM | POA: Diagnosis not present

## 2022-09-29 DIAGNOSIS — I959 Hypotension, unspecified: Secondary | ICD-10-CM | POA: Diagnosis not present

## 2022-09-29 DIAGNOSIS — Z992 Dependence on renal dialysis: Secondary | ICD-10-CM | POA: Diagnosis not present

## 2022-09-29 LAB — CBC
HCT: 33.9 % — ABNORMAL LOW (ref 36.0–46.0)
Hemoglobin: 10.5 g/dL — ABNORMAL LOW (ref 12.0–15.0)
MCH: 31.1 pg (ref 26.0–34.0)
MCHC: 31 g/dL (ref 30.0–36.0)
MCV: 100.3 fL — ABNORMAL HIGH (ref 80.0–100.0)
Platelets: 154 10*3/uL (ref 150–400)
RBC: 3.38 MIL/uL — ABNORMAL LOW (ref 3.87–5.11)
RDW: 14.6 % (ref 11.5–15.5)
WBC: 9.4 10*3/uL (ref 4.0–10.5)
nRBC: 0 % (ref 0.0–0.2)

## 2022-09-29 LAB — BASIC METABOLIC PANEL
Anion gap: 13 (ref 5–15)
BUN: 71 mg/dL — ABNORMAL HIGH (ref 8–23)
CO2: 27 mmol/L (ref 22–32)
Calcium: 8.6 mg/dL — ABNORMAL LOW (ref 8.9–10.3)
Chloride: 95 mmol/L — ABNORMAL LOW (ref 98–111)
Creatinine, Ser: 8.33 mg/dL — ABNORMAL HIGH (ref 0.44–1.00)
GFR, Estimated: 5 mL/min — ABNORMAL LOW (ref >60)
Glucose, Bld: 95 mg/dL (ref 70–99)
Potassium: 5.8 mmol/L — ABNORMAL HIGH (ref 3.5–5.1)
Sodium: 135 mmol/L (ref 135–145)

## 2022-09-29 LAB — LIPID PANEL
Cholesterol: 101 mg/dL (ref 0–200)
HDL: 32 mg/dL — ABNORMAL LOW (ref >40)
LDL Cholesterol: 49 mg/dL (ref 0–99)
Total CHOL/HDL Ratio: 3.2 RATIO
Triglycerides: 100 mg/dL (ref <150)
VLDL: 20 mg/dL (ref 0–40)

## 2022-09-29 LAB — ECHOCARDIOGRAM COMPLETE
AR max vel: 1.47 cm2
AV Peak grad: 10.2 mmHg
Ao pk vel: 1.6 m/s
Area-P 1/2: 2.95 cm2
Height: 64 in
S' Lateral: 3.2 cm
Weight: 3827.19 oz

## 2022-09-29 LAB — POTASSIUM: Potassium: 5.3 mmol/L — ABNORMAL HIGH (ref 3.5–5.1)

## 2022-09-29 LAB — HEMOGLOBIN A1C
Hgb A1c MFr Bld: 5.2 % (ref 4.8–5.6)
Mean Plasma Glucose: 102.54 mg/dL

## 2022-09-29 MED ORDER — LACTULOSE 10 GM/15ML PO SOLN
30.0000 g | ORAL | Status: AC
  Administered 2022-09-29: 30 g via ORAL
  Filled 2022-09-29: qty 60

## 2022-09-29 MED ORDER — ROSUVASTATIN CALCIUM 10 MG PO TABS
10.0000 mg | ORAL_TABLET | Freq: Every day | ORAL | Status: DC
  Administered 2022-09-30 – 2022-10-02 (×3): 10 mg via ORAL
  Filled 2022-09-29 (×3): qty 1

## 2022-09-29 MED ORDER — CARBAMIDE PEROXIDE 6.5 % OT SOLN
5.0000 [drp] | Freq: Two times a day (BID) | OTIC | Status: DC
  Administered 2022-09-29: 5 [drp] via OTIC
  Filled 2022-09-29: qty 15

## 2022-09-29 MED ORDER — SENNOSIDES-DOCUSATE SODIUM 8.6-50 MG PO TABS
2.0000 | ORAL_TABLET | Freq: Once | ORAL | Status: AC
  Administered 2022-09-29: 2 via ORAL
  Filled 2022-09-29: qty 2

## 2022-09-29 MED ORDER — SENNOSIDES-DOCUSATE SODIUM 8.6-50 MG PO TABS
2.0000 | ORAL_TABLET | Freq: Two times a day (BID) | ORAL | Status: DC
  Administered 2022-09-29 – 2022-10-02 (×5): 2 via ORAL
  Filled 2022-09-29 (×5): qty 2

## 2022-09-29 MED ORDER — POLYETHYLENE GLYCOL 3350 17 G PO PACK
17.0000 g | PACK | Freq: Every day | ORAL | Status: DC
  Administered 2022-09-29 – 2022-10-02 (×3): 17 g via ORAL
  Filled 2022-09-29 (×3): qty 1

## 2022-09-29 MED ORDER — BISACODYL 5 MG PO TBEC
10.0000 mg | DELAYED_RELEASE_TABLET | Freq: Every day | ORAL | Status: DC
  Administered 2022-09-29 – 2022-10-02 (×4): 10 mg via ORAL
  Filled 2022-09-29 (×4): qty 2

## 2022-09-29 NOTE — TOC Initial Note (Signed)
Transition of Care [facility]) - Initial/Assessment Note    Patient Details  Name: Jacqueline [name] MRN: [id number] Date of Birth: Nov 15, 1946  Transition of Care [facility]) CM/SW Contact:    Candie Chroman, LCSW Phone Number: 09/29/2022, 1:09 PM  Clinical Narrative:  Per chart review, patient admitted from [facility]. Admissions coordinator assistant confirmed she is a long-term resident. CSW met with patient. Son, [name], at bedside. CSW introduced role and explained that discharge planning would be discussed. Patient confirmed plan to return to [facility] at discharge. Son considering eventually bringing her home once care set up. Discussed CAP program. Left voicemail for CAP social worker. Will make referral once she calls back. No further concerns. CSW encouraged patient and her son to contact CSW as needed. CSW will continue to follow patient for support and facilitate return to SNF once medically stable.                Expected Discharge Plan: Skilled Nursing Facility Barriers to Discharge: Continued Medical Work up   Patient Goals and CMS Choice     Choice offered to / list presented to : Patient, Adult Children  Expected Discharge Plan and Services Expected Discharge Plan: [place] Acute Care Choice: Resumption of Svcs/PTA Provider Living arrangements for the past 2 months: [place]                                      Prior Living Arrangements/Services Living arrangements for the past 2 months: [place] Lives with:: Facility Resident Patient language and need for interpreter reviewed:: Yes Do you feel safe going back to the place where you live?: Yes      Need for Family Participation in Patient Care: Yes (Comment) Care giver support system in place?: Yes (comment)   Criminal Activity/Legal Involvement Pertinent to Current Situation/Hospitalization: No - Comment as needed  Activities of Daily Living Home Assistive  Devices/Equipment: Gilford Rile (specify type) ADL Screening (condition at time of admission) Patient's cognitive ability adequate to safely complete daily activities?: Yes Is the patient deaf or have difficulty hearing?: No Does the patient have difficulty seeing, even when wearing glasses/contacts?: No Does the patient have difficulty concentrating, remembering, or making decisions?: No Patient able to express need for assistance with ADLs?: Yes Does the patient have difficulty dressing or bathing?: Yes Independently performs ADLs?: No Communication: Needs assistance Is this a change from baseline?: Pre-admission baseline Dressing (OT): Needs assistance Is this a change from baseline?: Pre-admission baseline Grooming: Needs assistance Is this a change from baseline?: Pre-admission baseline Feeding: Needs assistance Is this a change from baseline?: Pre-admission baseline Bathing: Needs assistance Is this a change from baseline?: Pre-admission baseline Toileting: Needs assistance Is this a change from baseline?: Pre-admission baseline In/Out Bed: Needs assistance Is this a change from baseline?: Pre-admission baseline Walks in Home: Needs assistance Is this a change from baseline?: Pre-admission baseline Does the patient have difficulty walking or climbing stairs?: Yes Weakness of Legs: Both Weakness of Arms/Hands: None  Permission Sought/Granted Permission sought to share information with : Facility Sport and exercise psychologist, Family Supports Permission granted to share information with : Yes, Verbal Permission Granted  Share Information with NAME: Sharyn Dross  Permission granted to share info w AGENCY: [facility]  Permission granted to share info w Relationship: Son  Permission granted to share info w Contact Information: [phone]  Emotional Assessment  Appearance:: Appears stated age Attitude/Demeanor/Rapport: Engaged, Gracious Affect (typically observed): Accepting,  Appropriate, Calm, Pleasant Orientation: : Oriented to Self, Oriented to Place, Oriented to  Time, Oriented to Situation Alcohol / Substance Use: Not Applicable Psych Involvement: No (comment)  Admission diagnosis:  Hypotension, unspecified hypotension type [I95.9] AMS (altered mental status) [R41.82] Patient Active Problem List   Diagnosis Date Noted   AMS (altered mental status) 09/28/2022   Hypotension    Atrial fibrillation with slow ventricular response (HCC)    Pressure injury of skin 05/22/2022   General weakness 05/22/2022   Weakness 05/21/2022   Anaphylactic shock, unspecified, initial encounter 06/29/2020   Sepsis ([place]) 06/22/2019   Pain due to onychomycosis of toenails of both feet 06/11/2019   Callus 06/11/2019   Other disorders of phosphorus metabolism 05/20/2019   Mixed hyperlipidemia 03/22/2019   Otalgia of both ears 03/22/2019   Right arm pain 03/22/2019   Bradycardia 10/01/2017   Dyslipidemia 10/01/2017   Dependence on renal dialysis ([place]) 09/01/2017   Chest pain, unspecified 12/06/2016   Non-ST elevation (NSTEMI) myocardial infarction ([place]) 12/06/2016   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   History of NSTEMI    CAD (coronary artery disease) 12/03/2016   Hypokalemia 05/03/2016   Pruritus, unspecified 03/27/2015   Encounter for removal of sutures 11/30/2014   Pain in lower limb 08/26/2014   Encounter for immunization 06/10/2014   Unspecified protein-calorie malnutrition ([place]) 02/28/2014   Allergy, unspecified, initial encounter 02/15/2014   HCAP (healthcare-associated pneumonia) 02/13/2014   Absence of both cervix and uterus, acquired 02/01/2014   Coagulation defect, unspecified (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   OSA (obstructive sleep apnea) 01/21/2014   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[place]) 01/21/2014   Acute encephalopathy 01/19/2014   Congestive heart failure ([place]) 01/15/2014   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   Hypertension 01/15/2014   Diabetes mellitus ([place]) 01/15/2014   Anemia 01/15/2014   Onychomycosis 03/26/2013   Pain in joint, ankle and foot 03/26/2013   PCP:  [name], FNP Pharmacy:   [facility] ([place], [place] - [address] [facility] [address] [place] [address] [place] [address] Phone: [phone] Fax: [phone]  [id number] [place] [address] [place]Phone: [phone]: [address]Phone: [phone]: [phone]    Social Determinants of Health (SDOH) Interventions    Readmission Risk Interventions    05/22/2022   12:26 PM  Readmission Risk Prevention Plan  Transportation Screening Complete  PCP or Specialist Appt within 3-5 Days Complete  HRI or Home Care Consult Complete  Social Work Consult for RTelfairPlanning/Counseling Complete  Palliative Care Screening Complete  Medication Review (Press photographer Complete

## 2022-09-29 NOTE — Progress Notes (Signed)
  Progress Note   Patient: Jacqueline [name] EXB:[id number] DOB: 09-Apr-1946 DOA: 09/28/2022     0 DOS: the patient was seen and examined on 09/29/2022   Brief hospital course: No notes on file  Assessment and Plan: * AMS (altered mental status) Patient was said to have an episode of unresponsiveness which was transient and related to hypotension.   No documented loss of consciousness She is back to her baseline mental status Continue midodrine to maintain systolic blood pressure greater than 17mmHg  Son reports some transient slurred speech also and weakness We will obtain MRI of the brain and consult neurology.  CT head is negative for acute pathology  Hypotension Patient with history of hypertension and usually receives midodrine prior to dialysis. Had an episode of decreased responsiveness morning prior to to going to last dialysis and was noted to be hypotensive. She received a dose of midodrine in the ER with improvement in her blood pressure but now remains hypotensive. Continue scheduled midodrine on dialysis days   Atrial fibrillation with slow ventricular response (HCC) Appears to be new onset Patient noted to have a slow ventricular response Patient has a CHA2DS2-VASc score of 4 and started on Eliquis for long-term anticoagulation as primary prophylaxis for an acute stroke 2D echo shows normal LV systolic function Cardiology following Continue apixaban 2.5 mg p.o. twice daily  CAD (coronary artery disease) Continue Crestor and aspirin  ESRD on dialysis [facility]) Dialysis days are T/TH/S Patient does not show any signs and symptoms of fluid overload and has hypokalemia with a potassium of 5.8 status post Lokelma and potassium 5.3 now No indication for urgent renal replacement at this time Nephrology is following dialysis tomorrow          Subjective: Feels constipated.  Reports having no bowel movement for over a week.  Also reports her legs start in her left ear  and requesting some eardrops.  Son at bedside concerned about her transient slurring of speech and weakness along with altered mental status although she is at baseline now  Physical Exam: Vitals:   09/29/22 0727 09/29/22 1153 09/29/22 1420 09/29/22 1555  BP: 95/70 (!) 89/52 102/62 (!) 117/49  Pulse: (!) 49 (!) 55 (!) 56 (!) 50  Resp: 18 17  17   Temp: (!) 97.4 F (36.3 C) 98.1 F (36.7 C)  97.8 F (36.6 C)  TempSrc: Oral Oral  Oral  SpO2: 100% 100% 100% 100%  Weight:      Height:       76 year old obese female lying in the bed comfortably without any acute distress Lungs clear to auscultation bilaterally Cardiovascular regular rate and rhythm Abdomen soft, benign Neuro alert and awake, nonfocal Data Reviewed:  K 5.3 status post Lokelma (was 5.8 before that)  Family Communication: Son updated at bedside  Disposition: Status is: Observation The patient remains OBS appropriate and will d/c before 2 midnights.  Planned Discharge Destination: Home/[facility].  She is resident since June 2023    DVT prophylaxis-Eliquis Time spent: 35 minutes  Author: Max Sane, MD 09/29/2022 5:56 PM  For on call review [email or web address].

## 2022-09-29 NOTE — NC FL2 (Signed)
[place] LEVEL OF CARE SCREENING TOOL     IDENTIFICATION  Patient Name: Jacqueline [name] Birthdate: [birth year] Sex: female Admission Date (Current Location): 09/28/2022  [facility] [place] Number:  Engineering geologist and Address:  [facility], [address], [place], [place] [address]      Provider Number: [id number]  Attending Physician Name and Address:  Max Sane, MD  Relative Name and Phone Number:       Current Level of Care: Hospital Recommended Level of Care: [place] Prior Approval Number:    Date Approved/Denied:   PASRR Number: [id number]  Discharge Plan: SNF    Current Diagnoses: Patient Active Problem List   Diagnosis Date Noted   AMS (altered mental status) 09/28/2022   Hypotension    Atrial fibrillation with slow ventricular response ([place])    Pressure injury of skin 05/22/2022   General weakness 05/22/2022   Weakness 05/21/2022   Anaphylactic shock, unspecified, initial encounter 06/29/2020   Sepsis ([place]) 06/22/2019   Pain due to onychomycosis of toenails of both feet 06/11/2019   Callus 06/11/2019   Other disorders of phosphorus metabolism 05/20/2019   Mixed hyperlipidemia 03/22/2019   Otalgia of both ears 03/22/2019   Right arm pain 03/22/2019   Bradycardia 10/01/2017   Dyslipidemia 10/01/2017   Dependence on renal dialysis ([place]) 09/01/2017   Chest pain, unspecified 12/06/2016   Non-ST elevation (NSTEMI) myocardial infarction ([place]) 12/06/2016   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   History of NSTEMI    CAD (coronary artery disease) 12/03/2016   Hypokalemia 05/03/2016   Pruritus, unspecified 03/27/2015   Encounter for removal of sutures 11/30/2014   Pain in lower limb 08/26/2014   Encounter for immunization 06/10/2014   Unspecified protein-calorie malnutrition ([place]) 02/28/2014   Allergy, unspecified, initial encounter 02/15/2014   HCAP (healthcare-associated pneumonia)  02/13/2014   Absence of both cervix and uterus, acquired 02/01/2014   Coagulation defect, unspecified ([place]) 02/01/2014   Dyspnea, unspecified 02/01/2014   Family history of diabetes mellitus 02/01/2014   Family history of ischemic heart disease and other diseases of the circulatory system 02/01/2014   Fever, unspecified 02/01/2014   Headache, unspecified 02/01/2014   History of allergy to sulfonamides 02/01/2014   Iron deficiency anemia, unspecified 02/01/2014   Long term (current) use of insulin ([place]) 02/01/2014   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   OSA (obstructive sleep apnea) 01/21/2014   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[place]) 01/21/2014   Acute encephalopathy 01/19/2014   Congestive heart failure ([place]) 01/15/2014   ESRD on dialysis ([place]) 01/15/2014   Hypertension 01/15/2014   Diabetes mellitus ([place]) 01/15/2014   Anemia 01/15/2014   Onychomycosis 03/26/2013   Pain in joint, ankle and foot 03/26/2013    Orientation RESPIRATION BLADDER Height & Weight     Self, Time, Situation, Place  O2 (Nasal Cannula 2 L)  (Anuria/Oliguria) Weight: 239 lb 3.2 oz (108.5 kg) Height:  5\' 4"  (162.6 cm)  BEHAVIORAL SYMPTOMS/MOOD NEUROLOGICAL BOWEL NUTRITION STATUS   (None)   Continent Diet (Renal with fluid restriction: 1200 mL.)  AMBULATORY STATUS COMMUNICATION OF NEEDS Skin     Verbally Other (Comment) (Wound on left lateral back: No dressing.)                       Personal Care Assistance Level of Assistance              Functional Limitations Info  Sight, Hearing, Speech Sight Info: Adequate Hearing Info: Adequate Speech  Info: Adequate    SPECIAL CARE FACTORS FREQUENCY                       Contractures Contractures Info: Not present    Additional Factors Info  Code Status, Allergies Code Status Info: Full code Allergies Info: Sulfa Antibiotics, Sulfa Drugs Cross Reactors           Current Medications (09/29/2022):  This is the current  hospital active medication list Current Facility-Administered Medications  Medication Dose Route Frequency Provider Last Rate Last Admin   acetaminophen (TYLENOL) tablet 650 mg  650 mg Oral Q6H PRN Agbata, Tochukwu, MD       Or   acetaminophen (TYLENOL) suppository 650 mg  650 mg Rectal Q6H PRN Agbata, Tochukwu, MD       albuterol (PROVENTIL) (2.5 MG/3ML) 0.083% nebulizer solution 3 mL  3 mL Inhalation Q6H PRN Agbata, Tochukwu, MD       apixaban (ELIQUIS) tablet 2.5 mg  2.5 mg Oral BID Agbata, Tochukwu, MD   2.5 mg at 09/29/22 0900   aspirin EC tablet 81 mg  81 mg Oral Daily Agbata, Tochukwu, MD   81 mg at 09/29/22 0901   bisacodyl (DULCOLAX) EC tablet 10 mg  10 mg Oral Daily Manuella Ghazi, Vipul, MD       carbamide peroxide (DEBROX) 6.5 % OTIC (EAR) solution 5 drop  5 drop Left EAR BID Max Sane, MD       cholecalciferol (VITAMIN D3) 25 MCG (1000 UNIT) tablet 2,000 Units  2,000 Units Oral Daily Agbata, Tochukwu, MD   2,000 Units at 09/29/22 0900   cinacalcet (SENSIPAR) tablet 30 mg  30 mg Oral Q lunch Agbata, Tochukwu, MD       ezetimibe (ZETIA) tablet 10 mg  10 mg Oral Daily Agbata, Tochukwu, MD   10 mg at 09/29/22 0902   ferric citrate (AURYXIA) tablet 420 mg  420 mg Oral TID WC Agbata, Tochukwu, MD   420 mg at 09/29/22 0900   midodrine (PROAMATINE) tablet 10 mg  10 mg Oral 2 times per day on Tue Thu Sat Agbata, Tochukwu, MD   10 mg at 09/28/22 1700   multivitamin (RENA-VIT) tablet 1 tablet  1 tablet Oral Daily Agbata, Tochukwu, MD   1 tablet at 09/29/22 0902   ondansetron (ZOFRAN) tablet 4 mg  4 mg Oral Q6H PRN Agbata, Tochukwu, MD       Or   ondansetron (ZOFRAN) injection 4 mg  4 mg Intravenous Q6H PRN Agbata, Tochukwu, MD       pantoprazole (PROTONIX) EC tablet 40 mg  40 mg Oral Daily Agbata, Tochukwu, MD   40 mg at 09/29/22 0901   polyethylene glycol (MIRALAX / GLYCOLAX) packet 17 g  17 g Oral Daily Max Sane, MD       [START ON 09/30/2022] rosuvastatin (CRESTOR) tablet 10 mg  10 mg Oral  Daily Manuella Ghazi, Vipul, MD       senna-docusate (Senokot-S) tablet 2 tablet  2 tablet Oral BID Max Sane, MD       sodium zirconium cyclosilicate (LOKELMA) packet 10 g  10 g Oral Daily Agbata, Tochukwu, MD   10 g at 09/29/22 0902     Discharge Medications: Please see discharge summary for a list of discharge medications.  Relevant Imaging Results:  Relevant Lab Results:   Additional Information SS#: [id number]. HD TTS [employer].  Candie Chroman, LCSW

## 2022-09-29 NOTE — Progress Notes (Signed)
Pt's BP is 82/43, pt asymptomatic . Pt had midodrine at 1700. Provider B.Randol Kern notified. See new orders . Will continue to monitor , call bell within reach.   09/28/22 2023  Vitals  Temp 98 F (36.7 C)  BP (!) 82/43  MAP (mmHg) (!) 56  BP Location Right Arm  BP Method Automatic  Patient Position (if appropriate) Lying  Pulse Rate (!) 51  Resp 20  Level of Consciousness  Level of Consciousness Alert  MEWS COLOR  MEWS Score Color Green  Oxygen Therapy  SpO2 97 %  O2 Device Room Air  MEWS Score  MEWS Temp 0  MEWS Systolic 1  MEWS Pulse 0  MEWS RR 0  MEWS LOC 0  MEWS Score 1

## 2022-09-29 NOTE — Progress Notes (Signed)
[facility]  ROUNDING NOTE   Subjective:   The patient reports that she missed HD yesterday  States that she has a "heart problem" and low bps on dialysis  States she is not usually on supplemental O2 but wore it last night and will remove it momentarily.    Objective:  Vital signs in last 24 hours:  Temp:  [97.4 F (36.3 C)-98.2 F (36.8 C)] 97.4 F (36.3 C) (10/29 0727) Pulse Rate:  [42-89] 49 (10/29 0727) Resp:  [12-20] 18 (10/29 0727) BP: (82-122)/(24-87) 95/70 (10/29 0727) SpO2:  [93 %-100 %] 100 % (10/29 0727) Weight:  [108.5 kg] 108.5 kg (10/28 1806)  Weight change:  Filed Weights   09/28/22 1031 09/28/22 1806  Weight: 108.4 kg 108.5 kg    Intake/Output: No intake/output data recorded.   Intake/Output this shift:  No intake/output data recorded.  Physical Exam: General: NAD,   Head: Normocephalic, atraumatic. Moist oral mucosal membranes  Eyes: Anicteric, PERRL  Neck: Supple, trachea midline  Lungs:  Clear to auscultation, on 2L [place]   Heart: Irregular   Abdomen:  Soft, nontender,   Extremities:  no peripheral edema.  Neurologic: Nonfocal, moving all four extremities  Skin: No lesions  Access: Left upper AVF     Basic Metabolic Panel: Recent Labs  Lab 09/28/22 1059 09/29/22 0451  NA 134* 135  K 5.4* 5.8*  CL 94* 95*  CO2 27 27  GLUCOSE 94 95  BUN 57* 71*  CREATININE 7.07* 8.33*  CALCIUM 8.9 8.6*  MG 2.0  --     Liver Function Tests: Recent Labs  Lab 09/28/22 1059  AST 32  ALT 23  ALKPHOS 83  BILITOT 0.7  PROT 8.0  ALBUMIN 3.7   No results for input(s): "LIPASE", "AMYLASE" in the last 168 hours. No results for input(s): "AMMONIA" in the last 168 hours.  CBC: Recent Labs  Lab 09/28/22 1027 09/29/22 0451  WBC 10.0 9.4  NEUTROABS 6.2  --   HGB 11.3* 10.5*  HCT 37.3 33.9*  MCV 102.5* 100.3*  PLT 157 154    Cardiac Enzymes: No results for input(s): "CKTOTAL", "CKMB", "CKMBINDEX", "TROPONINI" in the last 168  hours.  BNP: Invalid input(s): "POCBNP"  CBG: Recent Labs  Lab 09/28/22 2046  GLUCAP 148*    Microbiology: Results for orders placed or performed during the hospital encounter of 09/28/22  Resp Panel by RT-PCR (Flu A&B, Covid) Anterior Nasal Swab     Status: None   Collection Time: 09/28/22 11:39 AM   Specimen: Anterior Nasal Swab  Result Value Ref Range Status   SARS Coronavirus 2 by RT PCR NEGATIVE NEGATIVE Final    Comment: (NOTE) SARS-CoV-2 target nucleic acids are NOT DETECTED.  The SARS-CoV-2 RNA is generally detectable in upper respiratory specimens during the acute phase of infection. The lowest concentration of SARS-CoV-2 viral copies this assay can detect is 138 copies/mL. A negative result does not preclude SARS-Cov-2 infection and should not be used as the sole basis for treatment or other patient management decisions. A negative result may occur with  improper specimen collection/handling, submission of specimen other than nasopharyngeal swab, presence of viral mutation(s) within the areas targeted by this assay, and inadequate number of viral copies(<138 copies/mL). A negative result must be combined with clinical observations, patient history, and epidemiological information. The expected result is Negative.  Fact Sheet for Patients:  EntrepreneurPulse.com.au  Fact Sheet for Healthcare Providers:  IncredibleEmployment.be  This test is no t yet approved or cleared  by the [place] and  has been authorized for detection and/or diagnosis of SARS-CoV-2 by FDA under an Emergency Use Authorization (EUA). This EUA will remain  in effect (meaning this test can be used) for the duration of the COVID-19 declaration under Section 564(b)(1) of the Act, 21 U.S.C.section 360bbb-3(b)(1), unless the authorization is terminated  or revoked sooner.       Influenza A by PCR NEGATIVE NEGATIVE Final   Influenza B by PCR NEGATIVE  NEGATIVE Final    Comment: (NOTE) The Xpert Xpress SARS-CoV-2/FLU/RSV plus assay is intended as an aid in the diagnosis of influenza from Nasopharyngeal swab specimens and should not be used as a sole basis for treatment. Nasal washings and aspirates are unacceptable for Xpert Xpress SARS-CoV-2/FLU/RSV testing.  Fact Sheet for Patients: EntrepreneurPulse.com.au  Fact Sheet for Healthcare Providers: IncredibleEmployment.be  This test is not yet approved or cleared by the [place] FDA and has been authorized for detection and/or diagnosis of SARS-CoV-2 by FDA under an Emergency Use Authorization (EUA). This EUA will remain in effect (meaning this test can be used) for the duration of the COVID-19 declaration under Section 564(b)(1) of the Act, 21 U.S.C. section 360bbb-3(b)(1), unless the authorization is terminated or revoked.  Performed at [facility], [address] [place], [place] [address]     Coagulation Studies: No results for input(s): "LABPROT", "INR" in the last 72 hours.  Urinalysis: No results for input(s): "COLORURINE", "LABSPEC", "PHURINE", "GLUCOSEU", "HGBUR", "BILIRUBINUR", "KETONESUR", "PROTEINUR", "UROBILINOGEN", "NITRITE", "LEUKOCYTESUR" in the last 72 hours.  Invalid input(s): "APPERANCEUR"    Imaging: CT Head Wo Contrast  Result Date: 09/28/2022 CLINICAL DATA:  Mental status changes. EXAM: CT HEAD WITHOUT CONTRAST TECHNIQUE: Contiguous axial images were obtained from the base of the skull through the vertex without intravenous contrast. RADIATION DOSE REDUCTION: This exam was performed according to the departmental dose-optimization program which includes automated exposure control, adjustment of the mA and/or kV according to patient size and/or use of iterative reconstruction technique. COMPARISON:  CT head without contrast 05/27/2009 FINDINGS: Brain: Mild atrophy and white matter changes are present  bilaterally. No acute infarct, hemorrhage, or mass lesion is present. The ventricles are of normal size. Deep brain nuclei are within normal limits. No significant extraaxial fluid collection is present. The brainstem and cerebellum are within normal limits. Vascular: Atherosclerotic calcifications are present within the cavernous internal carotid arteries bilaterally and at the dural margin of both vertebral arteries. No hyperdense vessel is present. Skull: Calvarium is intact. No focal lytic or blastic lesions are present. No significant extracranial soft tissue lesion is present. Sinuses/Orbits: Chronic past occasion of the right maxillary sinus is present. Small mastoid effusions are present bilaterally. No obstructing nasopharyngeal lesion is present. The paranasal sinuses and mastoid air cells are otherwise clear. Left lens replacement is present. Globes and orbits are otherwise within normal limits. IMPRESSION: 1. Mild atrophy and white matter disease likely reflects the sequela of chronic microvascular ischemia. 2. No acute intracranial abnormality. 3. Chronic right maxillary sinus disease. 4. Small mastoid effusions bilaterally. No obstructing nasopharyngeal lesion is present. Electronically Signed   By: San Morelle M.D.   On: 09/28/2022 11:41   DG Chest 1 View  Result Date: 09/28/2022 CLINICAL DATA:  Dyspnea EXAM: CHEST  1 VIEW COMPARISON:  05/21/2022 CT scan and radiographs from 12/14/2021 FINDINGS: Mild enlargement of the cardiopericardial silhouette with upper zone pulmonary vascular prominence suggesting pulmonary venous hypertension. No overt edema. Atherosclerotic calcification of the aortic arch. Thoracic spondylosis.  No discrete blunting of the costophrenic angles. Airway thickening is present, suggesting bronchitis or reactive airways disease. Small calcified granuloma at the left lung apex. IMPRESSION: 1. Mild enlargement of the cardiopericardial silhouette with pulmonary venous  hypertension but no overt edema. 2. Airway thickening is present, suggesting bronchitis or reactive airways disease. 3. Thoracic spondylosis. 4. Atherosclerotic calcification of the aortic arch. Electronically Signed   By: Van Clines M.D.   On: 09/28/2022 11:05     Medications:     apixaban  2.5 mg Oral BID   aspirin EC  81 mg Oral Daily   cholecalciferol  2,000 Units Oral Daily   cinacalcet  30 mg Oral Q lunch   ezetimibe  10 mg Oral Daily   ferric citrate  420 mg Oral TID WC   midodrine  10 mg Oral 2 times per day on Tue Thu Sat   multivitamin  1 tablet Oral Daily   pantoprazole  40 mg Oral Daily   [START ON 09/30/2022] rosuvastatin  10 mg Oral Daily   sodium zirconium cyclosilicate  10 g Oral Daily   acetaminophen **OR** acetaminophen, albuterol, ondansetron **OR** ondansetron (ZOFRAN) IV  Assessment/ Plan:  Ms. TEOFILA BOWERY is a 76 y.o.  female  with past medical conditions including hyperlipidemia, CAD with stent, arthritis, hypertension, diabetes, GERD, obesity, chronic wounds, end-stage renal disease on hemodialysis who was admitted to [facility] on 10/28 for Hypotension, unspecified hypotension type [I95.9] AMS (altered mental status) [R41.82]   ESRD on HD with hypotension followed by [facility]. TTS  Patient did not received HD yesterday due to presenting to the hospital.  Will plan for HD tomorrow  Midodrine with HD for BP support    2. Hyperkalemia - K 5.8   - patient was given lokelma 10gram daily   3. Anemia of CKD  - Hgb 10.5    4. Secondary hyperparathroidism  On sensipar 30 mg daily with lunch      LOS: 0 Sevag Shearn P Jerik Falletta 10/29/202311:09 AM

## 2022-09-29 NOTE — Plan of Care (Signed)

## 2022-09-29 NOTE — Consult Note (Signed)
Neurology Consultation Reason for Consult: Slurred speech Requesting Physician: Max Sane  CC: Slurred speech in the setting of hypertension  History is obtained from: Patient and family at bedside as well as chart review  HPI: Jacqueline [name] is a 76 y.o. female with a past medical history significant for coronary artery disease s/p stents, remote hypertension, hyperlipidemia, diabetes, obesity (BMI 41.06), coronary artery disease s/p stents (2018), ESRD on IHD, chronic hypotension on midodrine (particularly if on dialysis days), chronic low back pain, neuropathy.  She was getting ready for dialysis when she had some hypotension, was slower to respond and was weaker than her baseline, perhaps particularly in the left upper extremity.  Subsequently when she was on the phone with her sister her speech was very slurred and therefore EMS was activated.  She additionally had an episode of emesis when in the ED.  At this time she is back to her baseline.  She was found to have A-fib with slow ventricular rate and was bradycardic overnight to the 30s  Regarding her weakness, this appears to be a chronic problem for the past 1 year, perhaps particularly affecting her proximal muscles (deltoids, hip flexors).  Her husband passed away in 2023/04/20 and had been her primary caregiver.  Subsequently she was admitted on 05/21/2022 through 05/30/2022 due to having developed pressure ulcers in the sacral area with family being unable to take care of her at home, and she was discharged to a skilled nursing facility.  She requires nursing home assistance due to difficulty dressing herself and managing her care although she reports she has been improving gradually with physical therapy.  Family reports that cognitively they have no concerns, but physically she is quite debilitated.  She has been making gains in the nursing facility   Premorbid modified rankin scale:      4 - Moderately severe disability. Unable to attend  to own bodily needs without assistance, and unable to walk unassisted.   ROS: All other review of systems was negative except as noted in the HPI.   Past Medical History:  Diagnosis Date   Arthritis    "left knee" (12/04/2016)   CAD (coronary artery disease)    a. 12/2016 NSTEMI/PCI: LM nl, LaD 20p/d, D1 20, OM2 20, OM3 20, RCA 78m (3.0x18 Resolute DES). EF 65%.   Chronic lower back pain    Diverticulosis    on CT   ESRD on dialysis [facility])    "[place]; [place]; TTS" (12/04/2016)   GERD (gastroesophageal reflux disease)    Hiatal hernia    Hyperlipidemia    Hypertension    a. 12/2016 labetalol d/c'd 2/2 hypotension in HD.   Morbid obesity ([place])    Obesity    Pneumonia    "couple times" (12/04/2016)   Renal insufficiency    Type II diabetes mellitus ([place])    Past Surgical History:  Procedure Laterality Date   ABDOMINAL HYSTERECTOMY     AV FISTULA PLACEMENT Left 01/27/2014   Procedure: ARTERIOVENOUS (AV) FISTULA CREATION;  Surgeon: Mal Misty, MD;  Location: [place];  Service: Vascular;  Laterality: Left;   AV FISTULA REPAIR     "had it cleaned out"   CARDIAC CATHETERIZATION  ?[place] N/A 12/04/2016   Procedure: Left Heart Cath Angiography;  Surgeon: Burnell Blanks, MD;  Location: [place] CV LAB;  Service: Cardiovascular;  Laterality: N/A;   CARDIAC CATHETERIZATION N/A 12/04/2016   Procedure: Coronary Stent Intervention;  Surgeon: Harrell Gave  Santina Evans, MD;  Location: [place] CV LAB;  Service: Cardiovascular;  Laterality: N/A;  Mid RCA   CATARACT EXTRACTION W/ INTRAOCULAR LENS IMPLANT Left    COLONOSCOPY W/ BIOPSIES AND POLYPECTOMY     CORONARY ANGIOPLASTY WITH STENT PLACEMENT  12/04/2016   ESOPHAGOGASTRODUODENOSCOPY     INSERTION OF DIALYSIS CATHETER N/A 01/19/2014   Procedure: INSERTION OF DIALYSIS CATHETER;  Surgeon: Rosetta Posner, MD;  Location: [facility] OR;  Service: Vascular;  Laterality: N/A;   Current Outpatient Medications   Medication Instructions   acetaminophen (TYLENOL) 1,300 mg, Oral, Every 8 hours PRN   albuterol (VENTOLIN HFA) 108 (90 Base) MCG/ACT inhaler 1-2 puffs, Inhalation, Every 6 hours PRN   aspirin EC 81 mg, Oral, Daily   B Complex-C-Zn-Folic Acid (DIALYVITE/ZINC) TABS 1 tablet, Oral, Daily   cinacalcet (SENSIPAR) 30 mg, Oral, Daily with supper   ezetimibe (ZETIA) 10 mg, Oral, Daily   ferric citrate (AURYXIA) 420 mg, Oral, 3 times daily with meals   fexofenadine (ALLEGRA) 180 mg, Oral, Daily   Fish Oil 1,000 mg, Oral, Daily   midodrine (PROAMATINE) 20 mg, Oral, Daily, (Take additional two tablets [20mg ] for administration at dialysis on Tuesday, Thursday and Saturday)   midodrine (PROAMATINE) 20 mg, Oral, Daily PRN   omeprazole (PRILOSEC) 20 mg, Oral, Daily   ondansetron (ZOFRAN-ODT) 4 mg, Oral, Every 8 hours PRN   polyethylene glycol (MIRALAX / GLYCOLAX) 17 g, Oral, Daily at bedtime   pregabalin (LYRICA) 100 mg, Oral, Daily at bedtime   pregabalin (LYRICA) 50 mg, Oral, 2 times daily   rosuvastatin (CRESTOR) 40 MG tablet TAKE 1 TABLET BY MOUTH DAILY   sennosides-docusate sodium (SENOKOT-S) 8.6-50 MG tablet 1 tablet, Oral, 2 times daily   trolamine salicylate (ASPERCREME) 10 % cream 1 Application, Topical, 2 times daily, (Apply to knees)     Family History  Problem Relation Age of Onset   Diabetes Mother    Peripheral vascular disease Mother        amputation   CAD Father    Heart disease Father    Hypertension Father    Heart attack Father    CAD Sister    Cancer Brother        x 2 brothers, type unknown   Breast cancer Sister      Social History:  reports that she quit smoking about 38 years ago. Her smoking use included cigarettes. She has a 20.00 pack-year smoking history. She has never used smokeless tobacco. She reports that she does not drink alcohol and does not use drugs.  Exam: Current vital signs: BP (!) 89/52 (BP Location: Right Arm)   Pulse (!) 55   Temp 98.1 F  (36.7 C) (Oral)   Resp 17   Ht 5\' 4"  (1.626 m)   Wt 108.5 kg   SpO2 100%   BMI 41.06 kg/m  Vital signs in last 24 hours: Temp:  [97.4 F (36.3 C)-98.1 F (36.7 C)] 98.1 F (36.7 C) (10/29 1153) Pulse Rate:  [42-89] 55 (10/29 1153) Resp:  [16-20] 17 (10/29 1153) BP: (82-118)/(37-70) 89/52 (10/29 1153) SpO2:  [97 %-100 %] 100 % (10/29 1153) Weight:  [108.5 kg] 108.5 kg (10/28 1806)   Physical Exam  Constitutional: Appears chronically ill Psych: Affect calm and cooperative to a point but then reports fatigue Eyes: No scleral injection HENT: No oropharyngeal obstruction.  Cardiovascular: Bradycardic to the 50s but perfusing extremities well Respiratory: Effort normal, non-labored breathing GI: Soft.  No distension. There is no tenderness.  Skin: Warm dry and intact visible skin  Neuro: Mental Status: Patient is awake, alert, oriented to person, place, month, year, and situation. Patient is able to give a clear and coherent history. No signs of aphasia or neglect Cranial Nerves: II: Visual Fields are full. Pupils are equal, round, and reactive to light.   III,IV, VI: EOMI without ptosis or diploplia.  Mildly saccadic pursuits V: Facial sensation is symmetric to light touch VII: Facial movement is notable for baseline mild right nasolabial fold flattening.  VIII: hearing is intact to voice X: Uvula elevates symmetrically XI: Shoulder shrug is symmetric. XII: tongue is midline without atrophy or fasciculations.  Motor: Examination is somewhat effort dependent and challenging in this setting.  She is able to move both of her arms antigravity but after a variable length of time (1 to 5 seconds) she will drop her left arm suddenly.  She is able to immediately raise the arm back up but states that she is weak on that side.  However on finger-nose testing she completes the full movement, slowly, before dropping the arm.  She does hold bilateral lower extremities up antigravity for 5  seconds.  Full strength testing truncated secondary to patient participation Sensory: Sensation is symmetric to light touch and temperature in the arms and legs. Deep Tendon Reflexes: 2+ and symmetric in the brachioradialis and patellae.  Cerebellar: Finger-nose is intact bilaterally, heel-to-shin is pain limited Gait:  Deferred  I have reviewed labs in epic and the results pertinent to this consultation are:  Basic Metabolic Panel: Recent Labs  Lab 09/28/22 1059 09/29/22 0451 09/29/22 1058  NA 134* 135  --   K 5.4* 5.8* 5.3*  CL 94* 95*  --   CO2 27 27  --   GLUCOSE 94 95  --   BUN 57* 71*  --   CREATININE 7.07* 8.33*  --   CALCIUM 8.9 8.6*  --   MG 2.0  --   --     CBC: Recent Labs  Lab 09/28/22 1027 09/29/22 0451  WBC 10.0 9.4  NEUTROABS 6.2  --   HGB 11.3* 10.5*  HCT 37.3 33.9*  MCV 102.5* 100.3*  PLT 157 154    Coagulation Studies: No results for input(s): "LABPROT", "INR" in the last 72 hours.   Lab Results  Component Value Date   HGBA1C 5.9 (H) 05/22/2022   Lab Results  Component Value Date   CHOL 142 10/10/2021   HDL 46 10/10/2021   LDLCALC 73 10/10/2021   TRIG 133 10/10/2021   CHOLHDL 3.1 10/10/2021    Lab Results  Component Value Date   CHOL 101 09/29/2022   HDL 32 (L) 09/29/2022   LDLCALC 49 09/29/2022   TRIG 100 09/29/2022   CHOLHDL 3.2 09/29/2022   Lab Results  Component Value Date   [id number] 109 01/17/2014      I have reviewed the images obtained:  MRI brain personally reviewed, agree with radiology:   1. No acute intracranial abnormality. 2. Periventricular and scattered subcortical T2 hyperintensities bilaterally are mildly advanced for age. The finding is nonspecific but can be seen in the setting of chronic microvascular ischemia, a demyelinating process such as multiple sclerosis, vasculitis, complicated migraine headaches, or as the sequelae of a prior infectious or inflammatory process. [Given patient history, most  likely chronic microvascular ischemia] 3. Bilateral mastoid effusions. No obstructing nasopharyngeal lesion is present.  ECHO  1. The left ventricle has normal function, LVEF=60-65%. The left  ventricle has no  regional wall motion abnormalities. There is mild  asymmetric left ventricular hypertrophy. Left ventricular diastolic  parameters are indeterminate.   2. Right ventricular systolic function is moderately reduced. The right  ventricular size is severely enlarged. Moderately increased right  ventricular wall thickness. There is mildly elevated pulmonary artery  systolic pressure. The estimated right  ventricular systolic pressure is 41.7 mmHg.   3. Right atrial size was mildly dilated.   4. The aortic valve is tricuspid. There is mild calcification of the  aortic valve. There is mild thickening of the aortic valve. Aortic valve  regurgitation is mild. Aortic valve sclerosis is present, with no evidence  of aortic valve stenosis.   5. The mitral valve is normal in structure. Mild mitral valve  regurgitation. No evidence of mitral stenosis.   6. The inferior vena cava is normal in size with greater than 50%  respiratory variability, suggesting right atrial pressure of 3 mmHg.   Impression: Decompensation of her baseline weakness secondary to symptomatic hypotension and potentially also secondary to her bradycardia.  Certainly there is a possibility of a TIA in the setting of her new diagnosis of atrial fibrillation.  Will complete TIA work-up with carotid duplex, A1c and lipid panel  Given her complaint primarily of proximal muscle weakness, will check some nutritional labs, vitamin D deficiency in particular can contribute to proximal weakness.  Given the chronicity of this problem further work-up can be completed on an outpatient basis.  I do wonder if there is some component depression contributing to her symptoms given the loss of her husband in May and some functional overlay on her  examination  Recommendations: -Consider cardiology consultation versus outpatient referral for bradycardia and hypotension -Agree with Eliquis for stroke prevention -Lipid panel ordered and LDL noted to be meeting goal of 49 -A1c recently at goal 5.9% 05/22/2022, repeat to confirm still at goal -Regarding her general debility, will order B12, MMA, vitamin D -Please supplement B12 for goal greater than 600; if B12 is normal but MMA results high this still indicates clinically significant deficiency -Please supplement vitamin D if it results low -Carotid duplex -Consider outpatient psychiatry referral -Patient/family prefer outpatient follow-up with [facility] clinic neurology, should be given number to call for appointment [phone] -Neurology will follow-up carotid ultrasound but otherwise will be available as needed going forward, please reach out if additional questions or concerns arise  Lesleigh Noe MD-PhD [facility] [phone] [facility] coverage for [facility] is from 8 AM to 4 AM in-house and 4 PM to 8 PM by telephone/video. 8 PM to 8 AM emergent questions or overnight urgent questions should be addressed to Teleneurology On-call or [facility] neurohospitalist; contact information can be found on AMION

## 2022-09-29 NOTE — Progress Notes (Signed)
Tele tech called and reported that's pt's heart rate dropped down to 34. Pt's heart rate is now 62 which has been how its been this admission. Pt asymptomatic , provider B.Randol Kern notified. See new orders , will continue to monitor , call bell is within reach. Pt states that she has not had a bowel movement in 5 days, provider notified.

## 2022-09-29 NOTE — Progress Notes (Signed)
       CROSS COVER NOTE  NAME: Jacqueline [name] MRN: [id number] DOB : March 18, 1946       HPI/Events of Note   Patient with Afib slow ventricular response and soft pressures noted to have heart rates in the 3s by tele several times. Patient asymtomatic Patient also reported no BM x 5 days  Assessment and  Interventions   Assessment:  Plan: EKG a fib with svr and no acute ST Twave changes - continue to monitor Senokot and lactulose. Dulc tab in am if not bm        Kathlene Cote NP [facility]

## 2022-09-29 NOTE — Progress Notes (Signed)
  Echocardiogram 2D Echocardiogram has been performed.  Jacqueline [name] M 09/29/2022, 10:19 AM

## 2022-09-30 ENCOUNTER — Inpatient Hospital Stay: Payer: Medicare Other

## 2022-09-30 DIAGNOSIS — I252 Old myocardial infarction: Secondary | ICD-10-CM | POA: Diagnosis not present

## 2022-09-30 DIAGNOSIS — I10 Essential (primary) hypertension: Secondary | ICD-10-CM

## 2022-09-30 DIAGNOSIS — K5900 Constipation, unspecified: Secondary | ICD-10-CM | POA: Diagnosis not present

## 2022-09-30 DIAGNOSIS — Z1152 Encounter for screening for COVID-19: Secondary | ICD-10-CM | POA: Diagnosis not present

## 2022-09-30 DIAGNOSIS — Z634 Disappearance and death of family member: Secondary | ICD-10-CM | POA: Diagnosis not present

## 2022-09-30 DIAGNOSIS — G8929 Other chronic pain: Secondary | ICD-10-CM | POA: Diagnosis present

## 2022-09-30 DIAGNOSIS — N2581 Secondary hyperparathyroidism of renal origin: Secondary | ICD-10-CM | POA: Diagnosis present

## 2022-09-30 DIAGNOSIS — E1151 Type 2 diabetes mellitus with diabetic peripheral angiopathy without gangrene: Secondary | ICD-10-CM | POA: Diagnosis present

## 2022-09-30 DIAGNOSIS — K219 Gastro-esophageal reflux disease without esophagitis: Secondary | ICD-10-CM | POA: Diagnosis present

## 2022-09-30 DIAGNOSIS — N186 End stage renal disease: Secondary | ICD-10-CM | POA: Diagnosis not present

## 2022-09-30 DIAGNOSIS — I2510 Atherosclerotic heart disease of native coronary artery without angina pectoris: Secondary | ICD-10-CM | POA: Diagnosis present

## 2022-09-30 DIAGNOSIS — I959 Hypotension, unspecified: Secondary | ICD-10-CM | POA: Diagnosis present

## 2022-09-30 DIAGNOSIS — I4891 Unspecified atrial fibrillation: Secondary | ICD-10-CM | POA: Diagnosis present

## 2022-09-30 DIAGNOSIS — E875 Hyperkalemia: Secondary | ICD-10-CM | POA: Diagnosis present

## 2022-09-30 DIAGNOSIS — D631 Anemia in chronic kidney disease: Secondary | ICD-10-CM | POA: Diagnosis present

## 2022-09-30 DIAGNOSIS — Z6841 Body Mass Index (BMI) 40.0 and over, adult: Secondary | ICD-10-CM | POA: Diagnosis not present

## 2022-09-30 DIAGNOSIS — M545 Low back pain, unspecified: Secondary | ICD-10-CM | POA: Diagnosis present

## 2022-09-30 DIAGNOSIS — M1712 Unilateral primary osteoarthritis, left knee: Secondary | ICD-10-CM | POA: Diagnosis present

## 2022-09-30 DIAGNOSIS — R410 Disorientation, unspecified: Secondary | ICD-10-CM | POA: Diagnosis not present

## 2022-09-30 DIAGNOSIS — K5790 Diverticulosis of intestine, part unspecified, without perforation or abscess without bleeding: Secondary | ICD-10-CM | POA: Diagnosis present

## 2022-09-30 DIAGNOSIS — E1122 Type 2 diabetes mellitus with diabetic chronic kidney disease: Secondary | ICD-10-CM | POA: Diagnosis present

## 2022-09-30 DIAGNOSIS — I4892 Unspecified atrial flutter: Secondary | ICD-10-CM | POA: Diagnosis present

## 2022-09-30 DIAGNOSIS — E785 Hyperlipidemia, unspecified: Secondary | ICD-10-CM | POA: Diagnosis present

## 2022-09-30 DIAGNOSIS — Z992 Dependence on renal dialysis: Secondary | ICD-10-CM | POA: Diagnosis not present

## 2022-09-30 DIAGNOSIS — I9589 Other hypotension: Secondary | ICD-10-CM | POA: Diagnosis present

## 2022-09-30 DIAGNOSIS — I132 Hypertensive heart and chronic kidney disease with heart failure and with stage 5 chronic kidney disease, or end stage renal disease: Secondary | ICD-10-CM | POA: Diagnosis present

## 2022-09-30 LAB — POTASSIUM: Potassium: 3.9 mmol/L (ref 3.5–5.1)

## 2022-09-30 LAB — BASIC METABOLIC PANEL
Anion gap: 14 (ref 5–15)
BUN: 90 mg/dL — ABNORMAL HIGH (ref 8–23)
CO2: 26 mmol/L (ref 22–32)
Calcium: 8.4 mg/dL — ABNORMAL LOW (ref 8.9–10.3)
Chloride: 96 mmol/L — ABNORMAL LOW (ref 98–111)
Creatinine, Ser: 9.67 mg/dL — ABNORMAL HIGH (ref 0.44–1.00)
GFR, Estimated: 4 mL/min — ABNORMAL LOW (ref >60)
Glucose, Bld: 87 mg/dL (ref 70–99)
Potassium: 6.4 mmol/L (ref 3.5–5.1)
Sodium: 136 mmol/L (ref 135–145)

## 2022-09-30 LAB — CBC
HCT: 31.2 % — ABNORMAL LOW (ref 36.0–46.0)
Hemoglobin: 9.5 g/dL — ABNORMAL LOW (ref 12.0–15.0)
MCH: 31 pg (ref 26.0–34.0)
MCHC: 30.4 g/dL (ref 30.0–36.0)
MCV: 102 fL — ABNORMAL HIGH (ref 80.0–100.0)
Platelets: 123 10*3/uL — ABNORMAL LOW (ref 150–400)
RBC: 3.06 MIL/uL — ABNORMAL LOW (ref 3.87–5.11)
RDW: 14.6 % (ref 11.5–15.5)
WBC: 8.2 10*3/uL (ref 4.0–10.5)
nRBC: 0 % (ref 0.0–0.2)

## 2022-09-30 LAB — HEPATITIS B CORE ANTIBODY, TOTAL: Hep B Core Total Ab: NONREACTIVE

## 2022-09-30 LAB — HEPATITIS B CORE ANTIBODY, IGM: Hep B C IgM: NONREACTIVE

## 2022-09-30 LAB — VITAMIN B12: Vitamin B-12: 658 pg/mL (ref 180–914)

## 2022-09-30 LAB — VITAMIN D 25 HYDROXY (VIT D DEFICIENCY, FRACTURES): Vit D, 25-Hydroxy: 78.61 ng/mL (ref 30–100)

## 2022-09-30 MED ORDER — PREGABALIN 50 MG PO CAPS
50.0000 mg | ORAL_CAPSULE | Freq: Two times a day (BID) | ORAL | Status: DC
  Administered 2022-09-30 – 2022-10-02 (×3): 50 mg via ORAL
  Filled 2022-09-30 (×3): qty 1

## 2022-09-30 MED ORDER — PENTAFLUOROPROP-TETRAFLUOROETH EX AERO
INHALATION_SPRAY | CUTANEOUS | Status: AC
  Filled 2022-09-30: qty 30

## 2022-09-30 MED ORDER — MIDODRINE HCL 5 MG PO TABS: 10.0000 mg | ORAL_TABLET | Freq: Once | ORAL | Status: AC

## 2022-09-30 MED ORDER — CHLORHEXIDINE GLUCONATE CLOTH 2 % EX PADS
6.0000 | MEDICATED_PAD | Freq: Every day | CUTANEOUS | Status: DC
  Administered 2022-10-01: 6 via TOPICAL

## 2022-09-30 MED ORDER — APIXABAN 5 MG PO TABS
5.0000 mg | ORAL_TABLET | Freq: Two times a day (BID) | ORAL | Status: DC
  Administered 2022-09-30 – 2022-10-02 (×4): 5 mg via ORAL
  Filled 2022-09-30 (×4): qty 1

## 2022-09-30 MED ORDER — ALBUMIN HUMAN 25 % IV SOLN: 25.0000 g | Freq: Once | INTRAVENOUS | Status: AC

## 2022-09-30 MED ORDER — MIDODRINE HCL 5 MG PO TABS
ORAL_TABLET | ORAL | Status: AC
  Administered 2022-09-30: 10 mg via ORAL
  Filled 2022-09-30: qty 2

## 2022-09-30 MED ORDER — PREGABALIN 50 MG PO CAPS: 50.0000 mg | ORAL_CAPSULE | Freq: Two times a day (BID) | ORAL | Status: DC

## 2022-09-30 MED ORDER — ALBUMIN HUMAN 25 % IV SOLN
INTRAVENOUS | Status: AC
  Administered 2022-09-30: 25 g via INTRAVENOUS
  Filled 2022-09-30: qty 100

## 2022-09-30 MED ORDER — PREGABALIN 50 MG PO CAPS
100.0000 mg | ORAL_CAPSULE | Freq: Every day | ORAL | Status: DC
  Administered 2022-09-30 – 2022-10-01 (×2): 100 mg via ORAL
  Filled 2022-09-30 (×2): qty 2

## 2022-09-30 NOTE — Progress Notes (Incomplete)
       CROSS COVER NOTE  NAME: GAETANA KAWAHARA MRN: [id number] DOB : May 30, 1946 ATTENDING PHYSICIAN: Max Sane, MD    Date of Service   09/30/2022   HPI/Events of Note   Pt requesting enema, soap suds given in [facility] ED in April with good effect  Interventions   Assessment/Plan:  Soap Suds Enema X X      This document was prepared using Dragon voice recognition software and may include unintentional dictation errors.  Neomia Glass DNP, MBA, FNP-BC Nurse Practitioner [facility] Pager [phone]

## 2022-09-30 NOTE — Hospital Course (Signed)
76 y.o. female with medical history significant for hypertension, end-stage renal disease secondary to diabetes mellitus (dialysis days are T/TH/S), coronary artery disease status post PCI with stent angioplasty, chronic low back pain, morbid obesity, GERD, dyslipidemia admitted for altered mental status, transient slurred speech, hypotension and bradycardia  10/29: Neurology consult, MRI brain, echo 10/30: Cardiology consult, dialysis 10/31: HD today.  Hypotensive

## 2022-09-30 NOTE — Assessment & Plan Note (Signed)
Patient was said to have an episode of unresponsiveness which was transient and related to hypotension.   No documented loss of consciousness She is back to her baseline mental status Continue midodrine  Carotid Doppler showed no significant stenosis CT head and MRI showing no acute pathology Appreciate neurology input

## 2022-09-30 NOTE — Progress Notes (Signed)
   09/30/22 1217  Vitals  Temp 97.7 F (36.5 C)  Temp Source Oral  BP (!) 128/58  MAP (mmHg) 79  BP Location Right Arm  BP Method Automatic  Patient Position (if appropriate) Lying  Pulse Rate 64  Pulse Rate Source Monitor  ECG Heart Rate 60  Resp 14  Oxygen Therapy  SpO2 100 %  O2 Device Nasal Cannula  O2 Flow Rate (L/min) 3 L/min  Patient Activity (if Appropriate) In bed  Pulse Oximetry Type Continuous  During Treatment Monitoring  Blood Flow Rate (mL/min) 200 mL/min  HD Safety Checks Performed Yes  Intra-Hemodialysis Comments Tx completed  Post Treatment  Dialyzer Clearance Lightly streaked  Duration of HD Treatment -hour(s) 3.5 hour(s)  Liters Processed 74  Fluid Removed 0 mL  Tolerated HD Treatment Yes  Post-Hemodialysis Comments tx completed. no complications.  Fistula / Graft Left Upper arm Arteriovenous fistula  No Placement Date or Time found.   Placed prior to admission: Yes  Orientation: Left  Access Location: Upper arm  Access Type: Arteriovenous fistula  Site Condition No complications  Fistula / Graft Assessment Present;Thrill;Bruit  Status Deaccessed  Needle Size 15  Drainage Description None

## 2022-09-30 NOTE — Assessment & Plan Note (Signed)
Appears to be new onset Patient noted to have a slow ventricular response Patient has a CHA2DS2-VASc score of 4 and started on Eliquis for long-term anticoagulation as primary prophylaxis for an acute stroke 2D echo shows normal LV systolic function Appreciate cardiology input.  Stop aspirin

## 2022-09-30 NOTE — TOC Progression Note (Signed)
Transition of Care [facility]) - Progression Note    Patient Details  Name: Jacqueline [name] MRN: [id number] Date of Birth: 1946-08-01  Transition of Care [facility]) CM/SW [place], LCSW Phone Number: 09/30/2022, 4:24 PM  Clinical Narrative:   Received call back from CAP social worker. Gave patient and son's names and contact information. She will call son towards the end of the week.  Expected Discharge Plan: [place] Barriers to Discharge: Continued Medical Work up  Expected Discharge Plan and Services Expected Discharge Plan: [place] Acute Care Choice: Resumption of Svcs/PTA Provider Living arrangements for the past 2 months: [place]                                       Social Determinants of Health (SDOH) Interventions    Readmission Risk Interventions    05/22/2022   12:26 PM  Readmission Risk Prevention Plan  Transportation Screening Complete  PCP or Specialist Appt within 3-5 Days Complete  HRI or [place] Complete  Social Work Consult for [place] Planning/Counseling Complete  Palliative Care Screening Complete  Medication Review Press photographer) Complete

## 2022-09-30 NOTE — Assessment & Plan Note (Signed)
Patient with history of hypertension and usually receives midodrine prior to dialysis. Had an episode of decreased responsiveness morning prior to to going to last dialysis and was noted to be hypotensive. Continue scheduled midodrine on dialysis days.  Cardiology consultation Echo within normal limits

## 2022-09-30 NOTE — Assessment & Plan Note (Signed)
Dialysis days are T/TH/S Dialyzed today

## 2022-09-30 NOTE — Progress Notes (Signed)
[facility]  ROUNDING NOTE   Subjective:   Patient seen and evaluated during dialysis   HEMODIALYSIS FLOWSHEET:  Blood Flow Rate (mL/min): 200 mL/min Arterial Pressure (mmHg): -90 mmHg Venous Pressure (mmHg): 230 mmHg TMP (mmHg): 10 mmHg Ultrafiltration Rate (mL/min): 251 mL/min Dialysate Flow Rate (mL/min): 300 ml/min  Currently resting well, no complaints at this time Patient remains in A-fib with heart rate 30s to 40s, asymptomatic   Objective:  Vital signs in last 24 hours:  Temp:  [97.7 F (36.5 C)-98.9 F (37.2 C)] 97.7 F (36.5 C) (10/30 1230) Pulse Rate:  [45-64] 59 (10/30 1230) Resp:  [11-20] 12 (10/30 1230) BP: (85-128)/(41-75) 114/56 (10/30 1230) SpO2:  [90 %-100 %] 100 % (10/30 1230) Weight:  [116.7 kg] 116.7 kg (10/30 1234)  Weight change:  Filed Weights   09/28/22 1806 09/30/22 0815 09/30/22 1234  Weight: 108.5 kg 116.7 kg 116.7 kg    Intake/Output: No intake/output data recorded.   Intake/Output this shift:  No intake/output data recorded.  Physical Exam: General: NAD  Head: Normocephalic, atraumatic. Moist oral mucosal membranes  Eyes: Anicteric  Lungs:  Clear to auscultation, on 2L [place]   Heart: Irregular   Abdomen:  Soft, nontender  Extremities:  no peripheral edema.  Neurologic: Nonfocal, moving all four extremities  Skin: No lesions  Access: Left upper AVF     Basic Metabolic Panel: Recent Labs  Lab 09/28/22 1059 09/29/22 0451 09/29/22 1058 09/30/22 0628  NA 134* 135  --  136  K 5.4* 5.8* 5.3* 6.4*  CL 94* 95*  --  96*  CO2 27 27  --  26  GLUCOSE 94 95  --  87  BUN 57* 71*  --  90*  CREATININE 7.07* 8.33*  --  9.67*  CALCIUM 8.9 8.6*  --  8.4*  MG 2.0  --   --   --      Liver Function Tests: Recent Labs  Lab 09/28/22 1059  AST 32  ALT 23  ALKPHOS 83  BILITOT 0.7  PROT 8.0  ALBUMIN 3.7    No results for input(s): "LIPASE", "AMYLASE" in the last 168 hours. No results for input(s): "AMMONIA" in the  last 168 hours.  CBC: Recent Labs  Lab 09/28/22 1027 09/29/22 0451 09/30/22 0628  WBC 10.0 9.4 8.2  NEUTROABS 6.2  --   --   HGB 11.3* 10.5* 9.5*  HCT 37.3 33.9* 31.2*  MCV 102.5* 100.3* 102.0*  PLT 157 154 123*     Cardiac Enzymes: No results for input(s): "CKTOTAL", "CKMB", "CKMBINDEX", "TROPONINI" in the last 168 hours.  BNP: Invalid input(s): "POCBNP"  CBG: Recent Labs  Lab 09/28/22 2046  GLUCAP 148*     Microbiology: Results for orders placed or performed during the hospital encounter of 09/28/22  Resp Panel by RT-PCR (Flu A&B, Covid) Anterior Nasal Swab     Status: None   Collection Time: 09/28/22 11:39 AM   Specimen: Anterior Nasal Swab  Result Value Ref Range Status   SARS Coronavirus 2 by RT PCR NEGATIVE NEGATIVE Final    Comment: (NOTE) SARS-CoV-2 target nucleic acids are NOT DETECTED.  The SARS-CoV-2 RNA is generally detectable in upper respiratory specimens during the acute phase of infection. The lowest concentration of SARS-CoV-2 viral copies this assay can detect is 138 copies/mL. A negative result does not preclude SARS-Cov-2 infection and should not be used as the sole basis for treatment or other patient management decisions. A negative result may occur with  improper  specimen collection/handling, submission of specimen other than nasopharyngeal swab, presence of viral mutation(s) within the areas targeted by this assay, and inadequate number of viral copies(<138 copies/mL). A negative result must be combined with clinical observations, patient history, and epidemiological information. The expected result is Negative.  Fact Sheet for Patients:  EntrepreneurPulse.com.au  Fact Sheet for Healthcare Providers:  IncredibleEmployment.be  This test is no t yet approved or cleared by the [place] FDA and  has been authorized for detection and/or diagnosis of SARS-CoV-2 by FDA under an Emergency Use  Authorization (EUA). This EUA will remain  in effect (meaning this test can be used) for the duration of the COVID-19 declaration under Section 564(b)(1) of the Act, 21 U.S.C.section 360bbb-3(b)(1), unless the authorization is terminated  or revoked sooner.       Influenza A by PCR NEGATIVE NEGATIVE Final   Influenza B by PCR NEGATIVE NEGATIVE Final    Comment: (NOTE) The Xpert Xpress SARS-CoV-2/FLU/RSV plus assay is intended as an aid in the diagnosis of influenza from Nasopharyngeal swab specimens and should not be used as a sole basis for treatment. Nasal washings and aspirates are unacceptable for Xpert Xpress SARS-CoV-2/FLU/RSV testing.  Fact Sheet for Patients: EntrepreneurPulse.com.au  Fact Sheet for Healthcare Providers: IncredibleEmployment.be  This test is not yet approved or cleared by the [place] FDA and has been authorized for detection and/or diagnosis of SARS-CoV-2 by FDA under an Emergency Use Authorization (EUA). This EUA will remain in effect (meaning this test can be used) for the duration of the COVID-19 declaration under Section 564(b)(1) of the Act, 21 U.S.C. section 360bbb-3(b)(1), unless the authorization is terminated or revoked.  Performed at [facility], [address] [place], [place] [address]     Coagulation Studies: No results for input(s): "LABPROT", "INR" in the last 72 hours.  Urinalysis: No results for input(s): "COLORURINE", "LABSPEC", "PHURINE", "GLUCOSEU", "HGBUR", "BILIRUBINUR", "KETONESUR", "PROTEINUR", "UROBILINOGEN", "NITRITE", "LEUKOCYTESUR" in the last 72 hours.  Invalid input(s): "APPERANCEUR"    Imaging: ECHOCARDIOGRAM COMPLETE  Result Date: 09/29/2022    ECHOCARDIOGRAM REPORT   Patient Name:   Jacqueline [name] Date of Exam: 09/29/2022 Medical Rec #:  [id number]         Height:       64.0 in Accession #:    [id number]        Weight:       239.2 lb Date of Birth:   01-Apr-1946          BSA:          2.111 m Patient Age:    58 years          BP:           95/70 mmHg Patient Gender: F                 HR:           53 bpm. Exam Location:  Inpatient Procedure: 2D Echo, Cardiac Doppler and Color Doppler Indications:     CHF-Acute Diastolic T06.26  History:         Patient has prior history of Echocardiogram examinations, most                  recent 06/23/2019. CAD, Arrythmias:Atrial Fibrillation; Risk                  Factors:Hypertension, Diabetes and Dyslipidemia. ESRD.  Sonographer:     [place] Referring Phys:  [id number] AGBATA Diagnosing Phys:  Eleonore Chiquito MD IMPRESSIONS  1. The left ventricle has normal function, LVEF=60-65%. The left ventricle has no regional wall motion abnormalities. There is mild asymmetric left ventricular hypertrophy. Left ventricular diastolic parameters are indeterminate.  2. Right ventricular systolic function is moderately reduced. The right ventricular size is severely enlarged. Moderately increased right ventricular wall thickness. There is mildly elevated pulmonary artery systolic pressure. The estimated right ventricular systolic pressure is 65.7 mmHg.  3. Right atrial size was mildly dilated.  4. The aortic valve is tricuspid. There is mild calcification of the aortic valve. There is mild thickening of the aortic valve. Aortic valve regurgitation is mild. Aortic valve sclerosis is present, with no evidence of aortic valve stenosis.  5. The mitral valve is normal in structure. Mild mitral valve regurgitation. No evidence of mitral stenosis.  6. The inferior vena cava is normal in size with greater than 50% respiratory variability, suggesting right atrial pressure of 3 mmHg. Comparison(s): Changes from prior study are noted. RV now severely dilated with moderate dysfunction. FINDINGS  Left Ventricle: Left ventricular ejection fraction, by estimation, is 60 to 65%. The left ventricle has normal function. The left ventricle has no  regional wall motion abnormalities. The left ventricular internal cavity size was normal in size. There is  mild asymmetric left ventricular hypertrophy of the septal segment. Left ventricular diastolic function could not be evaluated due to atrial fibrillation. Left ventricular diastolic parameters are indeterminate. The E/e' is 11.2. Right Ventricle: The right ventricular size is severely enlarged. Moderately increased right ventricular wall thickness. Right ventricular systolic function is moderately reduced. There is mildly elevated pulmonary artery systolic pressure. The tricuspid  regurgitant velocity is 2.96 m/s, and with an assumed right atrial pressure of 3 mmHg, the estimated right ventricular systolic pressure is 84.6 mmHg. Left Atrium: Left atrial size was normal in size. Right Atrium: Right atrial size was mildly dilated. Pericardium: There is no evidence of pericardial effusion. Presence of epicardial fat layer. Mitral Valve: The mitral valve is normal in structure. Mild mitral valve regurgitation. No evidence of mitral valve stenosis. Tricuspid Valve: The tricuspid valve is normal in structure. Tricuspid valve regurgitation is mild . No evidence of tricuspid stenosis. Aortic Valve: The aortic valve is tricuspid. There is mild calcification of the aortic valve. There is mild thickening of the aortic valve. Aortic valve regurgitation is mild. Aortic valve sclerosis is present, with no evidence of aortic valve stenosis. Aortic valve peak gradient measures 10.2 mmHg. Pulmonic Valve: The pulmonic valve was grossly normal. Pulmonic valve regurgitation is not visualized. No evidence of pulmonic stenosis. Aorta: The aortic root, ascending aorta and aortic arch are all structurally normal, with no evidence of dilitation or obstruction and the aortic root and ascending aorta are structurally normal, with no evidence of dilitation. Venous: The inferior vena cava is normal in size with greater than 50%  respiratory variability, suggesting right atrial pressure of 3 mmHg. IAS/Shunts: No atrial level shunt detected by color flow Doppler.  LEFT VENTRICLE PLAX 2D LVIDd:         5.10 cm   Diastology LVIDs:         3.20 cm   LV e' medial:    8.72 cm/s LV PW:         0.70 cm   LV E/e' medial:  10.6 LV IVS:        1.30 cm   LV e' lateral:   8.19 cm/s LVOT diam:     1.80 cm  LV E/e' lateral: 11.3 LV SV:         57 LV SV Index:   27 LVOT Area:     2.54 cm  RIGHT VENTRICLE RV Basal diam:  4.70 cm RV Mid diam:    3.20 cm RV S prime:     12.80 cm/s TAPSE (M-mode): 1.8 cm LEFT ATRIUM             Index        RIGHT ATRIUM           Index LA diam:        4.90 cm 2.32 cm/m   RA Area:     23.50 cm LA Vol (A2C):   52.8 ml 25.01 ml/m  RA Volume:   81.70 ml  38.70 ml/m LA Vol (A4C):   45.3 ml 21.46 ml/m LA Biplane Vol: 48.6 ml 23.02 ml/m  AORTIC VALVE AV Area (Vmax): 1.47 cm AV Vmax:        160.00 cm/s AV Peak Grad:   10.2 mmHg LVOT Vmax:      92.60 cm/s LVOT Vmean:     61.100 cm/s LVOT VTI:       0.225 m  AORTA Ao Root diam: 2.50 cm Ao Asc diam:  3.20 cm MITRAL VALVE               TRICUSPID VALVE MV Area (PHT): 2.95 cm    TR Peak grad:   35.0 mmHg MV Decel Time: 257 msec    TR Vmax:        296.00 cm/s MV E velocity: 92.57 cm/s                            SHUNTS                            Systemic VTI:  0.22 m                            Systemic Diam: 1.80 cm Eleonore Chiquito MD Electronically signed by Eleonore Chiquito MD Signature Date/Time: 09/29/2022/3:47:34 PM    Final    MR BRAIN WO CONTRAST  Result Date: 09/29/2022 CLINICAL DATA:  Mental status change, unknown cause. Intermittent episodes of unresponsiveness. EXAM: MRI HEAD WITHOUT CONTRAST TECHNIQUE: Multiplanar, multiecho pulse sequences of the brain and surrounding structures were obtained without intravenous contrast. COMPARISON:  CT head without contrast 09/28/2022 FINDINGS: Brain: No acute infarct, hemorrhage, or mass lesion is present. Periventricular and  scattered subcortical T2 hyperintensities are mildly advanced for age. The ventricles are of normal size. No significant extraaxial fluid collection is present. Basal ganglia are within normal limits. Remote lacunar infarcts are present within the thalami bilaterally. The internal auditory canals are within normal limits. The brainstem and cerebellum are within normal limits. Vascular: Flow is present in the major intracranial arteries. Skull and upper cervical spine: The craniocervical junction is normal. Upper cervical spine is within normal limits. Marrow signal is unremarkable. Sinuses/Orbits: Bilateral mastoid effusions are present. No obstructing nasopharyngeal lesion is present. A fluid level is present in the right maxillary sinus. Paranasal sinuses are otherwise clear. Left lens replacement is present. Globes and orbits are otherwise within normal limits. Other: IMPRESSION: 1. No acute intracranial abnormality. 2. Periventricular and scattered subcortical T2 hyperintensities bilaterally are mildly advanced for age. The finding is nonspecific but can be seen in  the setting of chronic microvascular ischemia, a demyelinating process such as multiple sclerosis, vasculitis, complicated migraine headaches, or as the sequelae of a prior infectious or inflammatory process. 3. Bilateral mastoid effusions. No obstructing nasopharyngeal lesion is present. Electronically Signed   By: San Morelle M.D.   On: 09/29/2022 14:27     Medications:     apixaban  5 mg Oral BID   aspirin EC  81 mg Oral Daily   bisacodyl  10 mg Oral Daily   carbamide peroxide  5 drop Left EAR BID   Chlorhexidine Gluconate Cloth  6 each Topical Q0600   cholecalciferol  2,000 Units Oral Daily   cinacalcet  30 mg Oral Q lunch   ezetimibe  10 mg Oral Daily   ferric citrate  420 mg Oral TID WC   midodrine  10 mg Oral 2 times per day on Tue Thu Sat   multivitamin  1 tablet Oral Daily   pantoprazole  40 mg Oral Daily    pentafluoroprop-tetrafluoroeth       polyethylene glycol  17 g Oral Daily   pregabalin  100 mg Oral QHS   pregabalin  50 mg Oral BID   rosuvastatin  10 mg Oral Daily   senna-docusate  2 tablet Oral BID   sodium zirconium cyclosilicate  10 g Oral Daily   acetaminophen **OR** acetaminophen, albuterol, ondansetron **OR** ondansetron (ZOFRAN) IV, pentafluoroprop-tetrafluoroeth  Assessment/ Plan:  Ms. AUBRYN SPINOLA is a 76 y.o.  female  with past medical conditions including hyperlipidemia, CAD with stent, arthritis, hypertension, diabetes, GERD, obesity, chronic wounds, end-stage renal disease on hemodialysis who was admitted to [facility] on 10/28 for Hypotension, unspecified hypotension type [I95.9] AMS (altered mental status) [R41.82] Hyperkalemia [E87.5]   [facility] [place]/TTS/left aVF  ESRD on HD with hypotension followed by [facility] [employer]. TTS  Patient received scheduled dialysis, UF 0 due to decreased heart rate.  Blood flow rate reduced to 350.  Blood pressure remained stable during dialysis 110/56 - 128/53.  Heart rate improved to 64 by end of treatment.  We will schedule short treatment tomorrow to maintain outpatient schedule.   2. Hyperkalemia -Potassium 6.4 this morning, we will recheck this afternoon.  She will be decreased with dialysis.  3. Anemia of CKD  - Hgb 9.5 today.   4. Secondary hyperparathroidism  On sensipar 30 mg daily with lunch and cholecalciferol.     LOS: 0 Shaneice Barsanti 10/30/20232:35 PM

## 2022-09-30 NOTE — Consult Note (Addendum)
Cardiology Consultation   Patient ID: EMMORY SOLIVAN MRN: [id number]; DOB: 1945-12-12  Admit date: 09/28/2022 Date of Consult: 09/30/2022  PCP:  [name], [place] [facility] Cardiologist:  Fransico Him, MD        Patient Profile:   Jacqueline [name] is a 76 y.o. 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  History of Present Illness:   Jacqueline [name] is a 76 year old female with a past medical history of hypertension, end stage renal disease secondary to diabetes, peripheral arterial disease, coronary artery disease s/p PCI/DES to the mid RCA in 12/2016, chronic obesity, GERD, dyslipidemia.  She was last seen in clinic by Dr Fletcher Anon on 04/26/22 for a follow up of her peripheral arterial disease where she was previously noted to have positive arterial studies. Prior to her visit she was seen by Dr. Bjorn Loser in podiatry and was noted to have bilateral foot pain with the pain being worse on the left side. At that time her functional capacity was poor walking short distances with a walker. Her claudication symptoms were mainly at night not during activity. After discussion she opted for conservative therapy.  She presented to the [facility] emergency department 09/28/22 with emesis and altered mental status. The nursing home staff reported that she missed dialysis because she had noted hypotension and decreased responsiveness. The documentation from the facility noted a drop in heart rate where her PCP had been notified. She had no complaints when examined in the emergency department per chart review.   Initial vitals: blood pressure 98/48, pulse 53, rr 13, temp  98.2  Pertinent labs: Hs troponins 33,34, sodium 134, potassium 5.4, bun 57, serum creatinine 7.07, hgb 11.3, hct 37.3, plts 157  Imaging: CXR reveals mild enlargement of the cardioipericardial silhouette with pulmonary venous hypertension, CT scan of the head without contrast shows mild atrophy and white matter disease   EKG: atrial fibrillation with slow ventricular response  Consult requested this morning for bradycardia and hypotension. Patient evaluated in the hemodialysis unit. Denies any complaints. Sleepy but easily aroused. Currently she had 2.5 hours remaining on dialysis. Heart rate 49 and systolic blood pressure 932 after scheduled midodrine and albumin.  Past Medical History:  Diagnosis Date   Arthritis    "left knee" (12/04/2016)   CAD (coronary artery disease)    a. 12/2016 NSTEMI/PCI: LM nl, LaD 20p/d, D1 20, OM2 20, OM3 20, RCA 42m (3.0x18 Resolute DES). EF 65%.   Chronic lower back pain    Diverticulosis    on CT   ESRD on dialysis [facility])    "[place]; [place]; TTS" (12/04/2016)   GERD (gastroesophageal reflux disease)    Hiatal hernia    Hyperlipidemia    Hypertension    a. 12/2016 labetalol d/c'd 2/2 hypotension in HD.   Morbid obesity ([place])    Obesity    Pneumonia    "couple times" (12/04/2016)   Renal insufficiency    Type II diabetes mellitus ([place])     Past Surgical History:  Procedure Laterality Date   ABDOMINAL HYSTERECTOMY     AV FISTULA PLACEMENT Left 01/27/2014   Procedure: ARTERIOVENOUS (AV) FISTULA CREATION;  Surgeon: Mal Misty, MD;  Location: [facility];  Service: Vascular;  Laterality: Left;   AV FISTULA REPAIR     "had it cleaned out"   CARDIAC CATHETERIZATION  ?1980s   CARDIAC CATHETERIZATION N/A 12/04/2016   Procedure: Left Heart Cath Angiography;  Surgeon: Burnell Blanks, MD;  Location: [facility];  Service: Cardiovascular;  Laterality: N/A;   CARDIAC CATHETERIZATION N/A 12/04/2016   Procedure: Coronary Stent  Intervention;  Surgeon: Burnell Blanks, MD;  Location: [facility];  Service: Cardiovascular;  Laterality: N/A;  Mid RCA   CATARACT EXTRACTION W/ INTRAOCULAR LENS IMPLANT Left    COLONOSCOPY W/ BIOPSIES AND POLYPECTOMY     CORONARY ANGIOPLASTY WITH STENT PLACEMENT  12/04/2016   ESOPHAGOGASTRODUODENOSCOPY     INSERTION OF DIALYSIS CATHETER N/A 01/19/2014   Procedure: INSERTION OF DIALYSIS CATHETER;  Surgeon: Rosetta Posner, MD;  Location: [facility] OR;  Service: Vascular;  Laterality: N/A;     Home Medications:  Prior to Admission medications   Medication Sig Start Date End Date Taking? Authorizing Provider  acetaminophen (TYLENOL) 650 MG CR tablet Take 1,300 mg by mouth every 8 (eight) hours as needed for pain.   Yes [provider]  albuterol (VENTOLIN HFA) 108 (90 Base) MCG/ACT inhaler Inhale 1-2 puffs into the lungs every 6 (six) hours as needed for wheezing or shortness of breath. 6/38/17  Yes Campbell Stall P, DO  aspirin EC 81 MG EC tablet Take 1 tablet (81 mg total) by mouth daily. 12/06/16  Yes Vann, Jessica U, DO  B Complex-C-Zn-Folic Acid (DIALYVITE/ZINC) TABS Take 1 tablet by mouth daily.   Yes [provider]  cinacalcet (SENSIPAR) 30 MG tablet Take 30 mg by mouth daily with supper.   Yes [provider]  ezetimibe (ZETIA) 10 MG tablet Take 1 tablet (10 mg total) by mouth daily. 04/30/22  Yes Wellington Hampshire, MD  ferric citrate (AURYXIA) 1 GM 210 MG(Fe) tablet Take 420 mg by mouth 3 (three) times daily with meals.   Yes [provider]  fexofenadine (ALLEGRA) 180 MG tablet Take 180 mg by mouth daily.   Yes [provider]  midodrine (PROAMATINE) 10 MG tablet Take 20 mg by mouth daily. (Take additional two tablets [20mg ] for administration at dialysis on Tuesday, Thursday and Saturday)   Yes [provider]  midodrine (PROAMATINE) 10 MG tablet Take 20 mg by mouth daily as needed (for SBP <100).   Yes [provider]   Omega-3 Fatty Acids (FISH OIL) 1000 MG CPDR Take 1,000 mg by mouth daily.   Yes [provider]  omeprazole (PRILOSEC) 20 MG capsule Take 1 capsule (20 mg total) by mouth daily. Patient taking differently: Take 20 mg by mouth 2 (two) times daily. 02/21/22  Yes [name], FNP  ondansetron (ZOFRAN-ODT) 4 MG disintegrating tablet Take 4 mg by mouth every 8 (eight) hours as needed for nausea or vomiting.   Yes [provider]  polyethylene glycol (MIRALAX / GLYCOLAX) 17 g packet Take 17 g by mouth at bedtime.   Yes [provider]  pregabalin (LYRICA) 100 MG capsule Take 100 mg by mouth at bedtime.   Yes [provider]  pregabalin (LYRICA) 50 MG capsule Take 50 mg by mouth 2 (two) times daily.   Yes [provider]  rosuvastatin (CRESTOR) 40 MG tablet TAKE 1 TABLET BY MOUTH DAILY 04/30/22  Yes Wellington Hampshire, MD  sennosides-docusate sodium (SENOKOT-S) 8.6-50 MG tablet Take 1 tablet by mouth 2 (two) times daily.   Yes  [provider]  trolamine salicylate (ASPERCREME) 10 % cream Apply 1 Application topically 2 (two) times daily. (Apply to knees)   Yes [provider]    Inpatient Medications: Scheduled Meds:  apixaban  2.5 mg Oral BID   aspirin EC  81 mg Oral Daily   bisacodyl  10 mg Oral Daily   carbamide peroxide  5 drop Left EAR BID   Chlorhexidine Gluconate Cloth  6 each Topical Q0600   cholecalciferol  2,000 Units Oral Daily   cinacalcet  30 mg Oral Q lunch   ezetimibe  10 mg Oral Daily   ferric citrate  420 mg Oral TID WC   midodrine  10 mg Oral 2 times per day on Tue Thu Sat   multivitamin  1 tablet Oral Daily   pantoprazole  40 mg Oral Daily   pentafluoroprop-tetrafluoroeth       polyethylene glycol  17 g Oral Daily   rosuvastatin  10 mg Oral Daily   senna-docusate  2 tablet Oral BID   sodium zirconium cyclosilicate  10 g Oral Daily   Continuous Infusions:  PRN Meds: acetaminophen **OR** acetaminophen,  albuterol, ondansetron **OR** ondansetron (ZOFRAN) IV, pentafluoroprop-tetrafluoroeth  Allergies:    Allergies  Allergen Reactions   Other Other (See Comments)   Sulfa Antibiotics Other (See Comments)   Sulfa Drugs Cross Reactors Other (See Comments)    Doesn't remember     Social History:   Social History   Socioeconomic History   Marital status: Widowed    Spouse name: Not on file   Number of children: 4   Years of education: Not on file   Highest education level: Not on file  Occupational History   Occupation: retired  Tobacco Use   Smoking status: Former    Packs/day: 1.00    Years: 20.00    Total pack years: 20.00    Types: Cigarettes    Quit date: 1985    Years since quitting: 38.8   Smokeless tobacco: Never  Vaping Use   Vaping Use: Never used  Substance and Sexual Activity   Alcohol use: No    Alcohol/week: 0.0 standard drinks of alcohol   Drug use: No   Sexual activity: Not Currently  Other Topics Concern   Not on file  Social History Narrative   Not on file   Social Determinants of Health   Financial Resource Strain: Medium Risk (06/27/2021)   Overall Financial Resource Strain (CARDIA)    Difficulty of Paying Living Expenses: Somewhat hard  Food Insecurity: No Food Insecurity (09/28/2022)   Hunger Vital Sign    Worried About Running Out of Food in the Last Year: Never true    Ran Out of Food in the Last Year: Never true  Transportation Needs: No Transportation Needs (09/28/2022)   PRAPARE - Hydrologist (Medical): No    Lack of Transportation (Non-Medical): No  Physical Activity: Inactive (06/27/2021)   Exercise Vital Sign    Days of Exercise per Week: 0 days    Minutes of Exercise per Session: 0 min  Stress: No Stress Concern Present (06/27/2021)   [place]    Feeling of Stress : Not at all  Social Connections: Not on file  Intimate Partner Violence:  Not At Risk (09/28/2022)   Humiliation, Afraid, Rape, and Kick questionnaire    Fear of Current or Ex-Partner: No    Emotionally Abused: No    Physically  Abused: No    Sexually Abused: No    Family History:    Family History  Problem Relation Age of Onset   Diabetes Mother    Peripheral vascular disease Mother        amputation   CAD Father    Heart disease Father    Hypertension Father    Heart attack Father    CAD Sister    Cancer Brother        x 2 brothers, type unknown   Breast cancer Sister      ROS:  Please see the history of present illness.  Review of Systems  Constitutional:  Positive for malaise/fatigue.  Neurological:  Positive for weakness.    All other ROS reviewed and negative.     Physical Exam/Data:   Vitals:   09/30/22 1200 09/30/22 1217 09/30/22 1230 09/30/22 1234  BP: (!) 126/59 (!) 128/58 (!) 114/56   Pulse: (!) 59 64 (!) 59   Resp: 14 14 12    Temp:  97.7 F (36.5 C) 97.7 F (36.5 C)   TempSrc:  Oral Oral   SpO2: 100% 100% 100%   Weight:    116.7 kg  Height:        Intake/Output Summary (Last 24 hours) at 09/30/2022 1313 Last data filed at 09/30/2022 1217 Gross per 24 hour  Intake --  Output 0 ml  Net 0 ml      09/30/2022   12:34 PM 09/30/2022    8:15 AM 09/28/2022    6:06 PM  Last 3 Weights  Weight (lbs) 257 lb 4.4 oz 257 lb 4.4 oz 239 lb 3.2 oz  Weight (kg) 116.7 kg 116.7 kg 108.5 kg     Body mass index is 44.16 kg/m.  General:  Well nourished, well developed, in no acute distress, visibly fatigued HEENT: normal Neck: no JVD Vascular: No carotid bruits; Distal pulses 1+ bilaterally Cardiac:  normal S1, S2; RRR; bradycardic no murmur Lungs: Slightly diminished to auscultation bilaterally, no wheezing, rhonchi or rales, unlabored at rest Abd: soft, nontender, obese, no hepatomegaly  Ext: Trace pretibial edema, AV fistula to left upper extremity positive for bruit and thrill Musculoskeletal:  No deformities, BUE and BLE  strength normal and equal Skin: warm and dry  Neuro:  CNs 2-12 intact, no focal abnormalities noted Psych: Flat affect   EKG:  The EKG was personally reviewed and demonstrates:  atrial fibrillation rate of 42 Telemetry:  Telemetry was personally reviewed and demonstrates:  atrial fibrillation/atrial flutter SVR rates 40-50  Relevant CV Studies: TTE 09/29/22 1. The left ventricle has normal function, LVEF=60-65%. The left  ventricle has no regional wall motion abnormalities. There is mild  asymmetric left ventricular hypertrophy. Left ventricular diastolic  parameters are indeterminate.   2. Right ventricular systolic function is moderately reduced. The right  ventricular size is severely enlarged. Moderately increased right  ventricular wall thickness. There is mildly elevated pulmonary artery  systolic pressure. The estimated right  ventricular systolic pressure is 81.1 mmHg.   3. Right atrial size was mildly dilated.   4. The aortic valve is tricuspid. There is mild calcification of the  aortic valve. There is mild thickening of the aortic valve. Aortic valve  regurgitation is mild. Aortic valve sclerosis is present, with no evidence  of aortic valve stenosis.   5. The mitral valve is normal in structure. Mild mitral valve  regurgitation. No evidence of mitral stenosis.   6. The inferior vena cava is normal in size  with greater than 50%  respiratory variability, suggesting right atrial pressure of 3 mmHg.   Laboratory Data:  High Sensitivity Troponin:   Recent Labs  Lab 09/28/22 1027 09/28/22 1139  TROPONINIHS 33* 34*     Chemistry Recent Labs  Lab 09/28/22 1059 09/29/22 0451 09/29/22 1058 09/30/22 0628  NA 134* 135  --  136  K 5.4* 5.8* 5.3* 6.4*  CL 94* 95*  --  96*  CO2 27 27  --  26  GLUCOSE 94 95  --  87  BUN 57* 71*  --  90*  CREATININE 7.07* 8.33*  --  9.67*  CALCIUM 8.9 8.6*  --  8.4*  MG 2.0  --   --   --   GFRNONAA 6* 5*  --  4*  ANIONGAP 13 13  --   14    Recent Labs  Lab 09/28/22 1059  PROT 8.0  ALBUMIN 3.7  AST 32  ALT 23  ALKPHOS 83  BILITOT 0.7   Lipids  Recent Labs  Lab 09/29/22 1655  CHOL 101  TRIG 100  HDL 32*  LDLCALC 49  CHOLHDL 3.2    Hematology Recent Labs  Lab 09/28/22 1027 09/29/22 0451 09/30/22 0628  WBC 10.0 9.4 8.2  RBC 3.64* 3.38* 3.06*  HGB 11.3* 10.5* 9.5*  HCT 37.3 33.9* 31.2*  MCV 102.5* 100.3* 102.0*  MCH 31.0 31.1 31.0  MCHC 30.3 31.0 30.4  RDW 14.6 14.6 14.6  PLT 157 154 123*   Thyroid  Recent Labs  Lab 09/28/22 1027  TSH 3.308    BNPNo results for input(s): "BNP", "PROBNP" in the last 168 hours.  DDimer No results for input(s): "DDIMER" in the last 168 hours.   Radiology/Studies:  ECHOCARDIOGRAM COMPLETE  Result Date: 09/29/2022    ECHOCARDIOGRAM REPORT   Patient Name:   EBONEE STOBER Date of Exam: 09/29/2022 Medical Rec #:  [id number]         Height:       64.0 in Accession #:    [id number]        Weight:       239.2 lb Date of Birth:  1946/08/13          BSA:          2.111 m Patient Age:    26 years          BP:           95/70 mmHg Patient Gender: F                 HR:           53 bpm. Exam Location:  Inpatient Procedure: 2D Echo, Cardiac Doppler and Color Doppler Indications:     CHF-Acute Diastolic W54.62  History:         Patient has prior history of Echocardiogram examinations, most                  recent 06/23/2019. CAD, Arrythmias:Atrial Fibrillation; Risk                  Factors:Hypertension, Diabetes and Dyslipidemia. ESRD.  Sonographer:     Darlina Sicilian RDCS Referring Phys:  [id number] AGBATA Diagnosing Phys: Eleonore Chiquito MD IMPRESSIONS  1. The left ventricle has normal function, LVEF=60-65%. The left ventricle has no regional wall motion abnormalities. There is mild asymmetric left ventricular hypertrophy. Left ventricular diastolic parameters are indeterminate.  2. Right ventricular systolic function is moderately reduced. The right ventricular  size is  severely enlarged. Moderately increased right ventricular wall thickness. There is mildly elevated pulmonary artery systolic pressure. The estimated right ventricular systolic pressure is 26.8 mmHg.  3. Right atrial size was mildly dilated.  4. The aortic valve is tricuspid. There is mild calcification of the aortic valve. There is mild thickening of the aortic valve. Aortic valve regurgitation is mild. Aortic valve sclerosis is present, with no evidence of aortic valve stenosis.  5. The mitral valve is normal in structure. Mild mitral valve regurgitation. No evidence of mitral stenosis.  6. The inferior vena cava is normal in size with greater than 50% respiratory variability, suggesting right atrial pressure of 3 mmHg. Comparison(s): Changes from prior study are noted. RV now severely dilated with moderate dysfunction. FINDINGS  Left Ventricle: Left ventricular ejection fraction, by estimation, is 60 to 65%. The left ventricle has normal function. The left ventricle has no regional wall motion abnormalities. The left ventricular internal cavity size was normal in size. There is  mild asymmetric left ventricular hypertrophy of the septal segment. Left ventricular diastolic function could not be evaluated due to atrial fibrillation. Left ventricular diastolic parameters are indeterminate. The E/e' is 11.2. Right Ventricle: The right ventricular size is severely enlarged. Moderately increased right ventricular wall thickness. Right ventricular systolic function is moderately reduced. There is mildly elevated pulmonary artery systolic pressure. The tricuspid  regurgitant velocity is 2.96 m/s, and with an assumed right atrial pressure of 3 mmHg, the estimated right ventricular systolic pressure is 34.1 mmHg. Left Atrium: Left atrial size was normal in size. Right Atrium: Right atrial size was mildly dilated. Pericardium: There is no evidence of pericardial effusion. Presence of epicardial fat layer. Mitral Valve: The  mitral valve is normal in structure. Mild mitral valve regurgitation. No evidence of mitral valve stenosis. Tricuspid Valve: The tricuspid valve is normal in structure. Tricuspid valve regurgitation is mild . No evidence of tricuspid stenosis. Aortic Valve: The aortic valve is tricuspid. There is mild calcification of the aortic valve. There is mild thickening of the aortic valve. Aortic valve regurgitation is mild. Aortic valve sclerosis is present, with no evidence of aortic valve stenosis. Aortic valve peak gradient measures 10.2 mmHg. Pulmonic Valve: The pulmonic valve was grossly normal. Pulmonic valve regurgitation is not visualized. No evidence of pulmonic stenosis. Aorta: The aortic root, ascending aorta and aortic arch are all structurally normal, with no evidence of dilitation or obstruction and the aortic root and ascending aorta are structurally normal, with no evidence of dilitation. Venous: The inferior vena cava is normal in size with greater than 50% respiratory variability, suggesting right atrial pressure of 3 mmHg. IAS/Shunts: No atrial level shunt detected by color flow Doppler.  LEFT VENTRICLE PLAX 2D LVIDd:         5.10 cm   Diastology LVIDs:         3.20 cm   LV e' medial:    8.72 cm/s LV PW:         0.70 cm   LV E/e' medial:  10.6 LV IVS:        1.30 cm   LV e' lateral:   8.19 cm/s LVOT diam:     1.80 cm   LV E/e' lateral: 11.3 LV SV:         57 LV SV Index:   27 LVOT Area:     2.54 cm  RIGHT VENTRICLE RV Basal diam:  4.70 cm RV Mid diam:    3.20 cm RV  S prime:     12.80 cm/s TAPSE (M-mode): 1.8 cm LEFT ATRIUM             Index        RIGHT ATRIUM           Index LA diam:        4.90 cm 2.32 cm/m   RA Area:     23.50 cm LA Vol (A2C):   52.8 ml 25.01 ml/m  RA Volume:   81.70 ml  38.70 ml/m LA Vol (A4C):   45.3 ml 21.46 ml/m LA Biplane Vol: 48.6 ml 23.02 ml/m  AORTIC VALVE AV Area (Vmax): 1.47 cm AV Vmax:        160.00 cm/s AV Peak Grad:   10.2 mmHg LVOT Vmax:      92.60 cm/s LVOT  Vmean:     61.100 cm/s LVOT VTI:       0.225 m  AORTA Ao Root diam: 2.50 cm Ao Asc diam:  3.20 cm MITRAL VALVE               TRICUSPID VALVE MV Area (PHT): 2.95 cm    TR Peak grad:   35.0 mmHg MV Decel Time: 257 msec    TR Vmax:        296.00 cm/s MV E velocity: 92.57 cm/s                            SHUNTS                            Systemic VTI:  0.22 m                            Systemic Diam: 1.80 cm Eleonore Chiquito MD Electronically signed by Eleonore Chiquito MD Signature Date/Time: 09/29/2022/3:47:34 PM    Final    MR BRAIN WO CONTRAST  Result Date: 09/29/2022 CLINICAL DATA:  Mental status change, unknown cause. Intermittent episodes of unresponsiveness. EXAM: MRI HEAD WITHOUT CONTRAST TECHNIQUE: Multiplanar, multiecho pulse sequences of the brain and surrounding structures were obtained without intravenous contrast. COMPARISON:  CT head without contrast 09/28/2022 FINDINGS: Brain: No acute infarct, hemorrhage, or mass lesion is present. Periventricular and scattered subcortical T2 hyperintensities are mildly advanced for age. The ventricles are of normal size. No significant extraaxial fluid collection is present. Basal ganglia are within normal limits. Remote lacunar infarcts are present within the thalami bilaterally. The internal auditory canals are within normal limits. The brainstem and cerebellum are within normal limits. Vascular: Flow is present in the major intracranial arteries. Skull and upper cervical spine: The craniocervical junction is normal. Upper cervical spine is within normal limits. Marrow signal is unremarkable. Sinuses/Orbits: Bilateral mastoid effusions are present. No obstructing nasopharyngeal lesion is present. A fluid level is present in the right maxillary sinus. Paranasal sinuses are otherwise clear. Left lens replacement is present. Globes and orbits are otherwise within normal limits. Other: IMPRESSION: 1. No acute intracranial abnormality. 2. Periventricular and scattered  subcortical T2 hyperintensities bilaterally are mildly advanced for age. The finding is nonspecific but can be seen in the setting of chronic microvascular ischemia, a demyelinating process such as multiple sclerosis, vasculitis, complicated migraine headaches, or as the sequelae of a prior infectious or inflammatory process. 3. Bilateral mastoid effusions. No obstructing nasopharyngeal lesion is present. Electronically Signed   By: San Morelle  M.D.   On: 09/29/2022 14:27   CT Head Wo Contrast  Result Date: 09/28/2022 CLINICAL DATA:  Mental status changes. EXAM: CT HEAD WITHOUT CONTRAST TECHNIQUE: Contiguous axial images were obtained from the base of the skull through the vertex without intravenous contrast. RADIATION DOSE REDUCTION: This exam was performed according to the departmental dose-optimization program which includes automated exposure control, adjustment of the mA and/or kV according to patient size and/or use of iterative reconstruction technique. COMPARISON:  CT head without contrast 05/27/2009 FINDINGS: Brain: Mild atrophy and white matter changes are present bilaterally. No acute infarct, hemorrhage, or mass lesion is present. The ventricles are of normal size. Deep brain nuclei are within normal limits. No significant extraaxial fluid collection is present. The brainstem and cerebellum are within normal limits. Vascular: Atherosclerotic calcifications are present within the cavernous internal carotid arteries bilaterally and at the dural margin of both vertebral arteries. No hyperdense vessel is present. Skull: Calvarium is intact. No focal lytic or blastic lesions are present. No significant extracranial soft tissue lesion is present. Sinuses/Orbits: Chronic past occasion of the right maxillary sinus is present. Small mastoid effusions are present bilaterally. No obstructing nasopharyngeal lesion is present. The paranasal sinuses and mastoid air cells are otherwise clear. Left lens  replacement is present. Globes and orbits are otherwise within normal limits. IMPRESSION: 1. Mild atrophy and white matter disease likely reflects the sequela of chronic microvascular ischemia. 2. No acute intracranial abnormality. 3. Chronic right maxillary sinus disease. 4. Small mastoid effusions bilaterally. No obstructing nasopharyngeal lesion is present. Electronically Signed   By: San Morelle M.D.   On: 09/28/2022 11:41   DG Chest 1 View  Result Date: 09/28/2022 CLINICAL DATA:  Dyspnea EXAM: CHEST  1 VIEW COMPARISON:  05/21/2022 CT scan and radiographs from 12/14/2021 FINDINGS: Mild enlargement of the cardiopericardial silhouette with upper zone pulmonary vascular prominence suggesting pulmonary venous hypertension. No overt edema. Atherosclerotic calcification of the aortic arch. Thoracic spondylosis. No discrete blunting of the costophrenic angles. Airway thickening is present, suggesting bronchitis or reactive airways disease. Small calcified granuloma at the left lung apex. IMPRESSION: 1. Mild enlargement of the cardiopericardial silhouette with pulmonary venous hypertension but no overt edema. 2. Airway thickening is present, suggesting bronchitis or reactive airways disease. 3. Thoracic spondylosis. 4. Atherosclerotic calcification of the aortic arch. Electronically Signed   By: Van Clines M.D.   On: 09/28/2022 11:05     Assessment and Plan:   Atrial fibrillation with slow ventricular response -Patient remains in atrial fibrillation with slow ventricular response on telemetry monitoring -this is unchanged since arrival -She was changed to apixaban 5 mg twice daily for CHA2DS2-VASc score of at least 6 as she does not qualify for reduced dosing -Avoid AV nodal blocking agents due to SVR -Likely component of elevated potassium -Continue cardiac monitoring -TSH 3.308 -if remains svr after electrolyte correction, consider ZIO monitor on discharge  Coronary artery disease  with PCI/DES -Patient remains chest pain-free -No ischemic changes noted on EKG -High-sensitivity troponins 33 and 34 flat, not indicative of acute coronary syndrome -Continued on aspirin and rosuvastatin and ezetimibe -Echocardiogram completed which revealed LVEF of 60-65%, no regional wall motion abnormalities, mild left ventricular hypertrophy, right atrial size is mildly dilated, mild aortic regurgitation, mild mitral regurgitation -Continue cardiac monitoring -EKG as needed for changes or pain  End stage renal disease on dialysis -Continued on hemodialysis, dialysis days are Tuesday Thursday Saturday -Patient appears euvolemic on exam -Fluid removal managed by nephrology -Nephrology continues to follow  Hypotension -Blood pressure 122/47 -Blood pressure has been soft with systolic blood pressures as low as 85 -Was given 10 mg of midodrine prior to dialysis along with albumin with noted improvement in blood pressure -Continued on midodrine prior to dialysis  Hyperkalemia -Critical potassium this morning is 6.4 -Previous potassium 5.8 given lactulose with reduction in potassium to 5.3 -Dialysis completed today -would benefit from repeat potasium this afternoon -daily bmp -Magnesium 2.0  Hyperlipidemia -LDL 49 -Continued on rosuvastatin and ezetimibe  7.   Altered mental status -questionable episode of unresponsiveness with transient hypotension -continues to have no documented loss of consciousness -appears to back at her baseline on exam -has been evaluated by Neurology -CT of the head negative for acute findings -will need outpatient neurology follow up  -carotid duplex ordered and pending   Risk Assessment/Risk Scores:          CHA2DS2-VASc Score = 6   This indicates a 9.7% annual risk of stroke. The patient's score is based upon: CHF History: 0 HTN History: 1 Diabetes History: 1 Stroke History: 0 Vascular Disease History: 1 Age Score: 2 Gender Score:  1         For questions or updates, please contact [place] Please consult [email or web address] for contact info under    Signed, Jacqueline Kunz, NP  09/30/2022 1:13 PM

## 2022-09-30 NOTE — Assessment & Plan Note (Signed)
6.5-> 3.9 status post dialysis

## 2022-09-30 NOTE — Assessment & Plan Note (Signed)
Continue Crestor and aspirin.  Cardiology seen

## 2022-09-30 NOTE — Progress Notes (Signed)
Lab called with critical potassium level of 6.4. Provider Manuella Ghazi paged. Provider is aware and called back , stated that pt will receive dialysis this a.m and he will contact dialysis now. Day time nurse aware.

## 2022-09-30 NOTE — Assessment & Plan Note (Signed)
On several scheduled stool softeners including Senokot-S, MiraLAX, Dulcolax.  If it does not get resolved may benefit from enema

## 2022-09-30 NOTE — Progress Notes (Signed)
  Progress Note   Patient: Jacqueline [name] UJW:[id number] DOB: October 03, 1946 DOA: 09/28/2022     0 DOS: the patient was seen and examined on 09/30/2022   Brief hospital course: 76 y.o. female with medical history significant for hypertension, end-stage renal disease secondary to diabetes mellitus (dialysis days are T/TH/S), coronary artery disease status post PCI with stent angioplasty, chronic low back pain, morbid obesity, GERD, dyslipidemia admitted for altered mental status, transient slurred speech, hypotension and bradycardia  10/29: Neurology consult, MRI brain, echo 10/30: Cardiology consult, dialysis   Assessment and Plan: * AMS (altered mental status) Patient was said to have an episode of unresponsiveness which was transient and related to hypotension.   No documented loss of consciousness She is back to her baseline mental status Continue midodrine  Carotid Doppler showed no significant stenosis CT head and MRI showing no acute pathology Appreciate neurology input  Hypotension Patient with history of hypertension and usually receives midodrine prior to dialysis. Had an episode of decreased responsiveness morning prior to to going to last dialysis and was noted to be hypotensive. Continue scheduled midodrine on dialysis days.  Cardiology consultation Echo within normal limits   Atrial fibrillation with slow ventricular response ([place]) Appears to be new onset Patient noted to have a slow ventricular response Patient has a CHA2DS2-VASc score of 4 and started on Eliquis for long-term anticoagulation as primary prophylaxis for an acute stroke 2D echo shows normal LV systolic function Appreciate cardiology input.  Stop aspirin   Constipation On several scheduled stool softeners including Senokot-S, MiraLAX, Dulcolax.  If it does not get resolved may benefit from enema  Hyperkalemia 6.5-> 3.9 status post dialysis  CAD (coronary artery disease) Continue Crestor and  aspirin.  Cardiology seen  ESRD on dialysis [facility]) Dialysis days are T/TH/S Dialyzed today          Subjective: Seen in dialysis.  No new complaints  Physical Exam: Vitals:   09/30/22 1230 09/30/22 1234 09/30/22 1629 09/30/22 2146  BP: (!) 114/56  (!) 121/57 (!) 120/45  Pulse: (!) 59  60 (!) 54  Resp: 12  18   Temp: 97.7 F (36.5 C)  97.9 F (36.6 C) 98.5 F (36.9 C)  TempSrc: Oral  Oral Oral  SpO2: 100%  100% 100%  Weight:  116.7 kg    Height:       77 year old obese female lying in the bed comfortably without any acute distress Lungs clear to auscultation bilaterally Cardiovascular regular rate and rhythm Abdomen soft, benign Neuro alert and awake, nonfocal Data Reviewed:  Potassium was 6.4 but normalized after dialysis and is 3.9 now  Family Communication: None  Disposition: Status is: Inpatient Remains inpatient appropriate because: Management of hyperkalemia, hypotension and bradycardia   Planned Discharge Destination: Skilled nursing facility    DVT prophylaxis-Eliquis Time spent: 35 minutes  Author: Max Sane, MD 09/30/2022 10:04 PM  For on call review [email or web address].

## 2022-10-01 ENCOUNTER — Telehealth (HOSPITAL_COMMUNITY): Payer: Self-pay | Admitting: Pharmacy Technician

## 2022-10-01 ENCOUNTER — Other Ambulatory Visit (HOSPITAL_COMMUNITY): Payer: Self-pay

## 2022-10-01 DIAGNOSIS — R410 Disorientation, unspecified: Secondary | ICD-10-CM | POA: Diagnosis not present

## 2022-10-01 DIAGNOSIS — N186 End stage renal disease: Secondary | ICD-10-CM | POA: Diagnosis not present

## 2022-10-01 DIAGNOSIS — K5900 Constipation, unspecified: Secondary | ICD-10-CM | POA: Diagnosis not present

## 2022-10-01 DIAGNOSIS — I4891 Unspecified atrial fibrillation: Secondary | ICD-10-CM | POA: Diagnosis not present

## 2022-10-01 LAB — HEPATITIS B SURFACE ANTIGEN: Hepatitis B Surface Ag: NONREACTIVE

## 2022-10-01 LAB — HEPATITIS B SURFACE ANTIBODY, QUANTITATIVE: Hep B S AB Quant (Post): 63.8 m[IU]/mL (ref >9.9)

## 2022-10-01 LAB — CBC WITH DIFFERENTIAL/PLATELET
Abs Immature Granulocytes: 0.03 10*3/uL (ref 0.00–0.07)
Basophils Absolute: 0.1 10*3/uL (ref 0.0–0.1)
Basophils Relative: 1 %
Eosinophils Absolute: 0 10*3/uL (ref 0.0–0.5)
Eosinophils Relative: 0 %
HCT: 30.8 % — ABNORMAL LOW (ref 36.0–46.0)
Hemoglobin: 9.7 g/dL — ABNORMAL LOW (ref 12.0–15.0)
Immature Granulocytes: 0 %
Lymphocytes Relative: 10 %
Lymphs Abs: 0.9 10*3/uL (ref 0.7–4.0)
MCH: 31.6 pg (ref 26.0–34.0)
MCHC: 31.5 g/dL (ref 30.0–36.0)
MCV: 100.3 fL — ABNORMAL HIGH (ref 80.0–100.0)
Monocytes Absolute: 0.9 10*3/uL (ref 0.1–1.0)
Monocytes Relative: 9 %
Neutro Abs: 7.8 10*3/uL — ABNORMAL HIGH (ref 1.7–7.7)
Neutrophils Relative %: 80 %
Platelets: 143 10*3/uL — ABNORMAL LOW (ref 150–400)
RBC: 3.07 MIL/uL — ABNORMAL LOW (ref 3.87–5.11)
RDW: 14.4 % (ref 11.5–15.5)
WBC: 9.7 10*3/uL (ref 4.0–10.5)
nRBC: 0 % (ref 0.0–0.2)

## 2022-10-01 LAB — RENAL FUNCTION PANEL
Albumin: 3.7 g/dL (ref 3.5–5.0)
Anion gap: 14 (ref 5–15)
BUN: 43 mg/dL — ABNORMAL HIGH (ref 8–23)
CO2: 27 mmol/L (ref 22–32)
Calcium: 8.4 mg/dL — ABNORMAL LOW (ref 8.9–10.3)
Chloride: 93 mmol/L — ABNORMAL LOW (ref 98–111)
Creatinine, Ser: 6.03 mg/dL — ABNORMAL HIGH (ref 0.44–1.00)
GFR, Estimated: 7 mL/min — ABNORMAL LOW (ref >60)
Glucose, Bld: 80 mg/dL (ref 70–99)
Phosphorus: 3.6 mg/dL (ref 2.5–4.6)
Potassium: 4.4 mmol/L (ref 3.5–5.1)
Sodium: 134 mmol/L — ABNORMAL LOW (ref 135–145)

## 2022-10-01 NOTE — Assessment & Plan Note (Signed)
Patient was said to have an episode of unresponsiveness which was transient and related to hypotension.   No documented loss of consciousness She is back to her baseline mental status Continue midodrine  Carotid Doppler showed no significant stenosis CT head and MRI showing no acute pathology Appreciate neurology input

## 2022-10-01 NOTE — Assessment & Plan Note (Signed)
Continue Crestor and aspirin.  Cardiology following

## 2022-10-01 NOTE — TOC Benefit Eligibility Note (Signed)
Patient Advocate Encounter  Insurance verification completed.    The patient is currently admitted and upon discharge could be taking Eliquis 5 mg.  The current 30 day co-pay is $0.00.   The patient is insured through [employer] Medicare Part D     Malike Foglio, CPhT Pharmacy Patient Advocate Specialist [facility] Patient Advocate Team Direct Number: [phone]  Fax: [phone]       

## 2022-10-01 NOTE — Telephone Encounter (Signed)
Pharmacy Patient Advocate Encounter  Insurance verification completed.    The patient is insured through [employer] Medicare Part D   The patient is currently admitted and ran test claims for the following: Eliquis.  Copays and coinsurance results were relayed to Inpatient clinical team.  

## 2022-10-01 NOTE — Assessment & Plan Note (Signed)
6.5-> 4.4 status post dialysis

## 2022-10-01 NOTE — Assessment & Plan Note (Signed)
Patient with history of hypertension and usually receives midodrine prior to dialysis. Had an episode of decreased responsiveness morning prior to to going to last dialysis and was noted to be hypotensive. Continue scheduled midodrine on dialysis days.  Cardiology input appreciated Echo within normal limits  Remained hypotensive today

## 2022-10-01 NOTE — Progress Notes (Signed)
Rounding Note    Patient Name: Jacqueline [name] Date of Encounter: 10/01/2022  [place] Cardiologist: Fransico Him, MD   Subjective   Patient seen on a.m. rounds while on dialysis.  She denies any chest pain or shortness of breath.  She is -500 mL output in the last 24 hours.  She has remained sinus to sinus bradycardia on telemetry monitoring overnight with stable blood pressures.  Inpatient Medications    Scheduled Meds:  apixaban  5 mg Oral BID   bisacodyl  10 mg Oral Daily   carbamide peroxide  5 drop Left EAR BID   Chlorhexidine Gluconate Cloth  6 each Topical Q0600   cholecalciferol  2,000 Units Oral Daily   cinacalcet  30 mg Oral Q lunch   ezetimibe  10 mg Oral Daily   ferric citrate  420 mg Oral TID WC   midodrine  10 mg Oral 2 times per day on Tue Thu Sat   multivitamin  1 tablet Oral Daily   pantoprazole  40 mg Oral Daily   polyethylene glycol  17 g Oral Daily   pregabalin  100 mg Oral QHS   pregabalin  50 mg Oral BID   rosuvastatin  10 mg Oral Daily   senna-docusate  2 tablet Oral BID   sodium zirconium cyclosilicate  10 g Oral Daily   Continuous Infusions:  PRN Meds: acetaminophen **OR** acetaminophen, albuterol, ondansetron **OR** ondansetron (ZOFRAN) IV   Vital Signs    Vitals:   10/01/22 1200 10/01/22 1230 10/01/22 1236 10/01/22 1301  BP: (!) 110/47 (!) 106/51 113/60   Pulse: 60 (!) 59 65   Resp: 13 20 19    Temp:   98.4 F (36.9 C)   TempSrc:   Oral   SpO2: 100% 100% 100%   Weight:    108.7 kg  Height:        Intake/Output Summary (Last 24 hours) at 10/01/2022 1449 Last data filed at 10/01/2022 1236 Gross per 24 hour  Intake --  Output 500 ml  Net -500 ml      10/01/2022    1:01 PM 10/01/2022    9:24 AM 10/01/2022    9:10 AM  Last 3 Weights  Weight (lbs) 239 lb 10.2 oz 239 lb 13.8 oz 239 lb 13.8 oz  Weight (kg) 108.7 kg 108.8 kg 108.8 kg      Telemetry    Sinus bradycardia sinus rhythm with rates of 50-60-  Personally Reviewed  ECG    No new tracings- Personally Reviewed  Physical Exam   GEN: No acute distress.   Neck: No JVD appreciated Cardiac: Irregularly irregular, I-II/VI murmur, without rubs, or gallops.  Respiratory: Diminished to auscultation bilaterally. GI: Soft, nontender, non-distended  MS: Trace pretibial edema; No deformity.  AV fistula to left upper extremity positive for bruit and thrill Neuro:  Nonfocal  Psych: Normal affect   Labs    High Sensitivity Troponin:   Recent Labs  Lab 09/28/22 1027 09/28/22 1139  TROPONINIHS 33* 34*     Chemistry Recent Labs  Lab 09/28/22 1059 09/29/22 0451 09/29/22 1058 09/30/22 0628 09/30/22 1719 10/01/22 0726  NA 134* 135  --  136  --  134*  K 5.4* 5.8*   < > 6.4* 3.9 4.4  CL 94* 95*  --  96*  --  93*  CO2 27 27  --  26  --  27  GLUCOSE 94 95  --  87  --  80  BUN  57* 71*  --  90*  --  43*  CREATININE 7.07* 8.33*  --  9.67*  --  6.03*  CALCIUM 8.9 8.6*  --  8.4*  --  8.4*  MG 2.0  --   --   --   --   --   PROT 8.0  --   --   --   --   --   ALBUMIN 3.7  --   --   --   --  3.7  AST 32  --   --   --   --   --   ALT 23  --   --   --   --   --   ALKPHOS 83  --   --   --   --   --   BILITOT 0.7  --   --   --   --   --   GFRNONAA 6* 5*  --  4*  --  7*  ANIONGAP 13 13  --  14  --  14   < > = values in this interval not displayed.    Lipids  Recent Labs  Lab 09/29/22 1655  CHOL 101  TRIG 100  HDL 32*  LDLCALC 49  CHOLHDL 3.2    Hematology Recent Labs  Lab 09/29/22 0451 09/30/22 0628 10/01/22 0929  WBC 9.4 8.2 9.7  RBC 3.38* 3.06* 3.07*  HGB 10.5* 9.5* 9.7*  HCT 33.9* 31.2* 30.8*  MCV 100.3* 102.0* 100.3*  MCH 31.1 31.0 31.6  MCHC 31.0 30.4 31.5  RDW 14.6 14.6 14.4  PLT 154 123* 143*   Thyroid  Recent Labs  Lab 09/28/22 1027  TSH 3.308    BNPNo results for input(s): "BNP", "PROBNP" in the last 168 hours.  DDimer No results for input(s): "DDIMER" in the last 168 hours.   Radiology    US  Carotid Bilateral  Result Date: 09/30/2022 CLINICAL DATA:  76 year old female with history of transient ischemic attack. EXAM: BILATERAL CAROTID DUPLEX ULTRASOUND TECHNIQUE: Pearline Cables scale imaging, color Doppler and duplex ultrasound were performed of bilateral carotid and vertebral arteries in the neck. COMPARISON:  None Available. FINDINGS: Criteria: Quantification of carotid stenosis is based on velocity parameters that correlate the residual internal carotid diameter with NASCET-based stenosis levels, using the diameter of the distal internal carotid lumen as the denominator for stenosis measurement. The following velocity measurements were obtained: RIGHT ICA: Peak systolic velocity 68 cm/sec, End diastolic velocity 9 cm/sec CCA: Peak systolic velocity 67 cm/sec SYSTOLIC ICA/CCA RATIO:  1.0 ECA: Peak systolic velocity 854 cm/sec LEFT ICA: Peak systolic velocity 79 cm/sec, End diastolic velocity 11 cm/sec CCA: 53 cm/sec SYSTOLIC ICA/CCA RATIO:  1.5 ECA: 86 cm/sec RIGHT CAROTID ARTERY: No atherosclerotic plaque formation. No significant tortuosity. Normal low resistance waveforms. RIGHT VERTEBRAL ARTERY:  Antegrade flow. LEFT CAROTID ARTERY: No atherosclerotic plaque formation. Tortuosity of the internal carotid artery is noted. Normal low resistance waveforms. LEFT VERTEBRAL ARTERY:  Antegrade flow. Upper extremity non-invasive blood pressures: Not obtained. IMPRESSION: 1. Right carotid artery system: Patent without significant atherosclerotic plaque formation. 2. Left carotid artery system: Patent without significant atherosclerotic plaque formation. Tortuosity of the internal carotid artery is noted. 3.  Vertebral artery system: Patent with antegrade flow bilaterally. Ruthann Cancer, MD [facility] Radiology Electronically Signed   By: Ruthann Cancer M.D.   On: 09/30/2022 15:40    Cardiac Studies   TTE 09/29/22 1. The left ventricle has normal function,  LVEF=60-65%. The left  ventricle has no regional wall motion abnormalities. There is mild  asymmetric left ventricular hypertrophy. Left ventricular diastolic  parameters are indeterminate.   2. Right ventricular systolic function is moderately reduced. The right  ventricular size is severely enlarged. Moderately increased right  ventricular wall thickness. There is mildly elevated pulmonary artery  systolic pressure. The estimated right  ventricular systolic pressure is 16.1 mmHg.   3. Right atrial size was mildly dilated.   4. The aortic valve is tricuspid. There is mild calcification of the  aortic valve. There is mild thickening of the aortic valve. Aortic valve  regurgitation is mild. Aortic valve sclerosis is present, with no evidence  of aortic valve stenosis.   5. The mitral valve is normal in structure. Mild mitral valve  regurgitation. No evidence of mitral stenosis.   6. The inferior vena cava is normal in size with greater than 50%  respiratory variability, suggesting right atrial pressure of 3 mmHg.  Patient Profile     76 y.o. female with history of hypertension, end-stage renal disease secondary to diabetes, peripheral artery disease, coronary disease status post PCI/DES, chronic back pain, morbid obesity, gastroesophageal reflux disease, dyslipidemia, has been seen and evaluated for new onset of atrial fibrillation with slow ventricular response.  Assessment & Plan    Atrial fibrillation with slow ventricular response -Patient remains in atrial fibrillation with slow ventricular response with telemetry monitoring -Continued on apixaban 5 mg twice daily for CHA2DS2-VASc of at least 6 -Continue to avoid AV nodal blocking agents due to SVR as well as antiemetics -Continue cardiac monitoring -TSH WNL  Coronary disease status post PCI/DES -She continues to remain chest pain-free -No ischemic changes noted on EKG -High-sensitivity troponin was 33 and 34 flat, not  indicative of acute coronary syndrome -Continue rosuvastatin and ezetimibe -Continue apixaban in lieu of aspirin -Echocardiogram revealed LVEF of 60 to 60%, no regional wall motion abnormalities, normal ventricular hypertrophy, right atrial size is mildly dilated, mild aortic regurgitation, mild mitral regurgitation -Continue cardiac monitoring -EKG as needed  for changes or pain  Stage renal disease on hemodialysis -Continued on hemodialysis -Dialysis days are typically Tuesday Thursday and Saturday -Fluid removal managed by nephrology -Neurology continues to follow  Hypertension -Blood pressure 139/47 -Chronic hypertension with midodrine required on dialysis days -Vital signs per unit protocol  Hyperkalemia -Potassium 4.4 today -6.4 yesterday prior to dialysis -Daily BMP  Hyperlipidemia -LDL 49 -Continue rosuvastatin and ezetimibe  Altered mental status -Questionable episode of unresponsiveness with transient hypotension -Appears to be back to baseline -CT of the head negative for acute findings -Evaluated by neurology  For questions or updates, please contact [place] Please consult [email or web address] for contact info under        Signed, Kyros Salzwedel, NP  10/01/2022, 2:49 PM

## 2022-10-01 NOTE — Progress Notes (Signed)
[facility]  ROUNDING NOTE   Subjective:   Patient seen and evaluated during dialysis   HEMODIALYSIS FLOWSHEET:  Blood Flow Rate (mL/min): 400 mL/min Arterial Pressure (mmHg): -150 mmHg Venous Pressure (mmHg): 230 mmHg TMP (mmHg): 6 mmHg Ultrafiltration Rate (mL/min): 433 mL/min Dialysate Flow Rate (mL/min): 300 ml/min Dialysis Fluid Bolus: Normal Saline Bolus Amount (mL): 300 mL  No complaints at this time, tolerating treatment well   Objective:  Vital signs in last 24 hours:  Temp:  [97.7 F (36.5 C)-98.5 F (36.9 C)] 98.4 F (36.9 C) (10/31 0911) Pulse Rate:  [54-75] 60 (10/31 1030) Resp:  [11-21] 19 (10/31 1030) BP: (92-139)/(38-59) 139/47 (10/31 1030) SpO2:  [96 %-100 %] 100 % (10/31 1030) Weight:  [108.8 kg-116.7 kg] 108.8 kg (10/31 0924)  Weight change:  Filed Weights   09/30/22 1234 10/01/22 0910 10/01/22 0924  Weight: 116.7 kg 108.8 kg 108.8 kg    Intake/Output: No intake/output data recorded.   Intake/Output this shift:  No intake/output data recorded.  Physical Exam: General: NAD  Head: Normocephalic, atraumatic. Moist oral mucosal membranes  Eyes: Anicteric  Lungs:  Clear to auscultation, on 2L [place]   Heart: Irregular   Abdomen:  Soft, nontender  Extremities:  no peripheral edema.  Neurologic: Nonfocal, moving all four extremities  Skin: No lesions  Access: Left upper AVF     Basic Metabolic Panel: Recent Labs  Lab 09/28/22 1059 09/29/22 0451 09/29/22 1058 09/30/22 0628 09/30/22 1719 10/01/22 0726  NA 134* 135  --  136  --  134*  K 5.4* 5.8* 5.3* 6.4* 3.9 4.4  CL 94* 95*  --  96*  --  93*  CO2 27 27  --  26  --  27  GLUCOSE 94 95  --  87  --  80  BUN 57* 71*  --  90*  --  43*  CREATININE 7.07* 8.33*  --  9.67*  --  6.03*  CALCIUM 8.9 8.6*  --  8.4*  --  8.4*  MG 2.0  --   --   --   --   --   PHOS  --   --   --   --   --  3.6     Liver Function Tests: Recent Labs  Lab 09/28/22 1059 10/01/22 0726  AST 32  --    ALT 23  --   ALKPHOS 83  --   BILITOT 0.7  --   PROT 8.0  --   ALBUMIN 3.7 3.7    No results for input(s): "LIPASE", "AMYLASE" in the last 168 hours. No results for input(s): "AMMONIA" in the last 168 hours.  CBC: Recent Labs  Lab 09/28/22 1027 09/29/22 0451 09/30/22 0628 10/01/22 0929  WBC 10.0 9.4 8.2 9.7  NEUTROABS 6.2  --   --  7.8*  HGB 11.3* 10.5* 9.5* 9.7*  HCT 37.3 33.9* 31.2* 30.8*  MCV 102.5* 100.3* 102.0* 100.3*  PLT 157 154 123* 143*     Cardiac Enzymes: No results for input(s): "CKTOTAL", "CKMB", "CKMBINDEX", "TROPONINI" in the last 168 hours.  BNP: Invalid input(s): "POCBNP"  CBG: Recent Labs  Lab 09/28/22 2046  GLUCAP 148*     Microbiology: Results for orders placed or performed during the hospital encounter of 09/28/22  Resp Panel by RT-PCR (Flu A&B, Covid) Anterior Nasal Swab     Status: None   Collection Time: 09/28/22 11:39 AM   Specimen: Anterior Nasal Swab  Result Value Ref Range Status  SARS Coronavirus 2 by RT PCR NEGATIVE NEGATIVE Final    Comment: (NOTE) SARS-CoV-2 target nucleic acids are NOT DETECTED.  The SARS-CoV-2 RNA is generally detectable in upper respiratory specimens during the acute phase of infection. The lowest concentration of SARS-CoV-2 viral copies this assay can detect is 138 copies/mL. A negative result does not preclude SARS-Cov-2 infection and should not be used as the sole basis for treatment or other patient management decisions. A negative result may occur with  improper specimen collection/handling, submission of specimen other than nasopharyngeal swab, presence of viral mutation(s) within the areas targeted by this assay, and inadequate number of viral copies(<138 copies/mL). A negative result must be combined with clinical observations, patient history, and epidemiological information. The expected result is Negative.  Fact Sheet for Patients:  EntrepreneurPulse.com.au  Fact Sheet  for Healthcare Providers:  IncredibleEmployment.be  This test is no t yet approved or cleared by the [place] FDA and  has been authorized for detection and/or diagnosis of SARS-CoV-2 by FDA under an Emergency Use Authorization (EUA). This EUA will remain  in effect (meaning this test can be used) for the duration of the COVID-19 declaration under Section 564(b)(1) of the Act, 21 U.S.C.section 360bbb-3(b)(1), unless the authorization is terminated  or revoked sooner.       Influenza A by PCR NEGATIVE NEGATIVE Final   Influenza B by PCR NEGATIVE NEGATIVE Final    Comment: (NOTE) The Xpert Xpress SARS-CoV-2/FLU/RSV plus assay is intended as an aid in the diagnosis of influenza from Nasopharyngeal swab specimens and should not be used as a sole basis for treatment. Nasal washings and aspirates are unacceptable for Xpert Xpress SARS-CoV-2/FLU/RSV testing.  Fact Sheet for Patients: EntrepreneurPulse.com.au  Fact Sheet for Healthcare Providers: IncredibleEmployment.be  This test is not yet approved or cleared by the [place] FDA and has been authorized for detection and/or diagnosis of SARS-CoV-2 by FDA under an Emergency Use Authorization (EUA). This EUA will remain in effect (meaning this test can be used) for the duration of the COVID-19 declaration under Section 564(b)(1) of the Act, 21 U.S.C. section 360bbb-3(b)(1), unless the authorization is terminated or revoked.  Performed at [facility], [address] [place], [place] [address]     Coagulation Studies: No results for input(s): "LABPROT", "INR" in the last 72 hours.  Urinalysis: No results for input(s): "COLORURINE", "LABSPEC", "PHURINE", "GLUCOSEU", "HGBUR", "BILIRUBINUR", "KETONESUR", "PROTEINUR", "UROBILINOGEN", "NITRITE", "LEUKOCYTESUR" in the last 72 hours.  Invalid input(s): "APPERANCEUR"    Imaging: US Carotid Bilateral  Result  Date: 09/30/2022 CLINICAL DATA:  76 year old female with history of transient ischemic attack. EXAM: BILATERAL CAROTID DUPLEX ULTRASOUND TECHNIQUE: Pearline Cables scale imaging, color Doppler and duplex ultrasound were performed of bilateral carotid and vertebral arteries in the neck. COMPARISON:  None Available. FINDINGS: Criteria: Quantification of carotid stenosis is based on velocity parameters that correlate the residual internal carotid diameter with NASCET-based stenosis levels, using the diameter of the distal internal carotid lumen as the denominator for stenosis measurement. The following velocity measurements were obtained: RIGHT ICA: Peak systolic velocity 68 cm/sec, End diastolic velocity 9 cm/sec CCA: Peak systolic velocity 67 cm/sec SYSTOLIC ICA/CCA RATIO:  1.0 ECA: Peak systolic velocity 703 cm/sec LEFT ICA: Peak systolic velocity 79 cm/sec, End diastolic velocity 11 cm/sec CCA: 53 cm/sec SYSTOLIC ICA/CCA RATIO:  1.5 ECA: 86 cm/sec RIGHT CAROTID ARTERY: No atherosclerotic plaque formation. No significant tortuosity. Normal low resistance waveforms. RIGHT VERTEBRAL ARTERY:  Antegrade flow. LEFT CAROTID ARTERY: No atherosclerotic plaque formation. Tortuosity of  the internal carotid artery is noted. Normal low resistance waveforms. LEFT VERTEBRAL ARTERY:  Antegrade flow. Upper extremity non-invasive blood pressures: Not obtained. IMPRESSION: 1. Right carotid artery system: Patent without significant atherosclerotic plaque formation. 2. Left carotid artery system: Patent without significant atherosclerotic plaque formation. Tortuosity of the internal carotid artery is noted. 3.  Vertebral artery system: Patent with antegrade flow bilaterally. Ruthann Cancer, MD [facility] Radiology Electronically Signed   By: Ruthann Cancer M.D.   On: 09/30/2022 15:40   MR BRAIN WO CONTRAST  Result Date: 09/29/2022 CLINICAL DATA:  Mental status change, unknown cause.  Intermittent episodes of unresponsiveness. EXAM: MRI HEAD WITHOUT CONTRAST TECHNIQUE: Multiplanar, multiecho pulse sequences of the brain and surrounding structures were obtained without intravenous contrast. COMPARISON:  CT head without contrast 09/28/2022 FINDINGS: Brain: No acute infarct, hemorrhage, or mass lesion is present. Periventricular and scattered subcortical T2 hyperintensities are mildly advanced for age. The ventricles are of normal size. No significant extraaxial fluid collection is present. Basal ganglia are within normal limits. Remote lacunar infarcts are present within the thalami bilaterally. The internal auditory canals are within normal limits. The brainstem and cerebellum are within normal limits. Vascular: Flow is present in the major intracranial arteries. Skull and upper cervical spine: The craniocervical junction is normal. Upper cervical spine is within normal limits. Marrow signal is unremarkable. Sinuses/Orbits: Bilateral mastoid effusions are present. No obstructing nasopharyngeal lesion is present. A fluid level is present in the right maxillary sinus. Paranasal sinuses are otherwise clear. Left lens replacement is present. Globes and orbits are otherwise within normal limits. Other: IMPRESSION: 1. No acute intracranial abnormality. 2. Periventricular and scattered subcortical T2 hyperintensities bilaterally are mildly advanced for age. The finding is nonspecific but can be seen in the setting of chronic microvascular ischemia, a demyelinating process such as multiple sclerosis, vasculitis, complicated migraine headaches, or as the sequelae of a prior infectious or inflammatory process. 3. Bilateral mastoid effusions. No obstructing nasopharyngeal lesion is present. Electronically Signed   By: San Morelle M.D.   On: 09/29/2022 14:27     Medications:     apixaban  5 mg Oral BID   bisacodyl  10 mg Oral Daily   carbamide peroxide  5 drop Left EAR BID   Chlorhexidine  Gluconate Cloth  6 each Topical Q0600   cholecalciferol  2,000 Units Oral Daily   cinacalcet  30 mg Oral Q lunch   ezetimibe  10 mg Oral Daily   ferric citrate  420 mg Oral TID WC   midodrine  10 mg Oral 2 times per day on Tue Thu Sat   multivitamin  1 tablet Oral Daily   pantoprazole  40 mg Oral Daily   polyethylene glycol  17 g Oral Daily   pregabalin  100 mg Oral QHS   pregabalin  50 mg Oral BID   rosuvastatin  10 mg Oral Daily   senna-docusate  2 tablet Oral BID   sodium zirconium cyclosilicate  10 g Oral Daily   acetaminophen **OR** acetaminophen, albuterol, ondansetron **OR** ondansetron (ZOFRAN) IV  Assessment/ Plan:  [name] is a 76 y.o.  female  with past medical conditions including hyperlipidemia, CAD with stent, arthritis, hypertension, diabetes, GERD, obesity, chronic wounds, end-stage renal disease on hemodialysis who was admitted to [facility] on 10/28 for Hypotension, unspecified hypotension type [I95.9] AMS (altered mental status) [R41.82] Hyperkalemia [E87.5]   [facility]/TTS/left aVF  ESRD on HD with hypotension followed by [facility] at  [facility]. TTS  Patient received dialysis yesterday, UF 0 due to bradycardia.  Patient receiving dialysis today to maintain outpatient schedule, UF goal 0.5 to 1 L as tolerated.  Patient cleared to discharge after dialysis to continue treatments in outpatient clinic.   2. Hyperkalemia -Potassium corrected after dialysis yesterday, 3.9.  3. Anemia of CKD  - Hgb remains stable   4. Secondary hyperparathroidism  On sensipar 30 mg daily with lunch and cholecalciferol. Calcium and phosphorus within desired range.    LOS: 1 Jacqueline [name] 10/31/202310:49 AM

## 2022-10-01 NOTE — Assessment & Plan Note (Signed)
Dialysis days are T/TH/S Dialyzed today

## 2022-10-01 NOTE — Progress Notes (Signed)
Hemodialysis Note  Received patient in bed to unit. Alert and oriented. Informed consent signed and in chart.   Treatment initiated:0923 Treatment completed:1243  Patient tolerated treatment well. Transported back to the room alert, without acute distress. Report given to patient's RN.  Access used:LUA AVF Access issues:None   Total UF removed:0.5 Liter/ 500 ml Medications given: None  Post HD VS: 113/60 65 19 SpO2 100% via [place] 2LPM Post HD [place], RN [facility]

## 2022-10-01 NOTE — Assessment & Plan Note (Signed)
On several scheduled stool softeners including Senokot-S, MiraLAX, Dulcolax.  Required soapsuds enema last night but still not feeling completely relieved

## 2022-10-01 NOTE — Assessment & Plan Note (Signed)
Appears to be new onset Patient noted to have a slow ventricular response Patient has a CHA2DS2-VASc score of 4 and started on Eliquis for long-term anticoagulation as primary prophylaxis for an acute stroke 2D echo shows normal LV systolic function Appreciate cardiology input.  Stop aspirin

## 2022-10-01 NOTE — Progress Notes (Signed)
  Progress Note   Patient: Jacqueline [name] BWG:[id number] DOB: [birth year] DOA: 09/28/2022     1 DOS: the patient was seen and examined on 10/01/2022   Brief hospital course: 76 y.o. female with medical history significant for hypertension, end-stage renal disease secondary to diabetes mellitus (dialysis days are T/TH/S), coronary artery disease status post PCI with stent angioplasty, chronic low back pain, morbid obesity, GERD, dyslipidemia admitted for altered mental status, transient slurred speech, hypotension and bradycardia  10/29: Neurology consult, MRI brain, echo 10/30: Cardiology consult, dialysis 10/31: HD today.  Hypotensive   Assessment and Plan: * AMS (altered mental status) Patient was said to have an episode of unresponsiveness which was transient and related to hypotension.   No documented loss of consciousness She is back to her baseline mental status Continue midodrine  Carotid Doppler showed no significant stenosis CT head and MRI showing no acute pathology Appreciate neurology input  Hypotension Patient with history of hypertension and usually receives midodrine prior to dialysis. Had an episode of decreased responsiveness morning prior to to going to last dialysis and was noted to be hypotensive. Continue scheduled midodrine on dialysis days.  Cardiology input appreciated Echo within normal limits  Remained hypotensive today   Atrial fibrillation with slow ventricular response ([place]) Appears to be new onset Patient noted to have a slow ventricular response Patient has a CHA2DS2-VASc score of 4 and started on Eliquis for long-term anticoagulation as primary prophylaxis for an acute stroke 2D echo shows normal LV systolic function Appreciate cardiology input.  Stop aspirin   Constipation On several scheduled stool softeners including Senokot-S, MiraLAX, Dulcolax.  Required soapsuds enema last night but still not feeling completely  relieved  Hyperkalemia 6.5-> 4.4 status post dialysis  CAD (coronary artery disease) Continue Crestor and aspirin.  Cardiology following  ESRD on dialysis [facility]) Dialysis days are T/TH/S Dialyzed today          Subjective: Scheduled for dialysis.  Feeling weak and hypotensive  Physical Exam: Vitals:   10/01/22 1236 10/01/22 1301 10/01/22 1551 10/01/22 2144  BP: 113/60  118/63 (!) 91/39  Pulse: 65  68 (!) 59  Resp: 19  18 18   Temp: 98.4 F (36.9 C)  98.3 F (36.8 C) 98.3 F (36.8 C)  TempSrc: Oral  Oral Oral  SpO2: 100%  97% 93%  Weight:  108.7 kg    Height:       76 year old obese female lying in the bed comfortably without any acute distress Lungs clear to auscultation bilaterally Cardiovascular regular rate and rhythm Abdomen soft, benign Neuro alert and awake, nonfocal Data Reviewed:  Hemoglobin 9.7  Family Communication: None  Disposition: Status is: Inpatient Remains inpatient appropriate because: Blood pressure monitoring and getting dialysis today   Planned Discharge Destination: Skilled nursing facility    DVT prophylaxis-Eliquis Time spent: 35 minutes  Author: Max Sane, MD 10/01/2022 10:13 PM  For on call review [email or web address].

## 2022-10-02 LAB — CBC WITH DIFFERENTIAL/PLATELET
Abs Immature Granulocytes: 0.02 10*3/uL (ref 0.00–0.07)
Basophils Absolute: 0.1 10*3/uL (ref 0.0–0.1)
Basophils Relative: 1 %
Eosinophils Absolute: 0.1 10*3/uL (ref 0.0–0.5)
Eosinophils Relative: 1 %
HCT: 31.1 % — ABNORMAL LOW (ref 36.0–46.0)
Hemoglobin: 9.4 g/dL — ABNORMAL LOW (ref 12.0–15.0)
Immature Granulocytes: 0 %
Lymphocytes Relative: 21 %
Lymphs Abs: 1.5 10*3/uL (ref 0.7–4.0)
MCH: 31.2 pg (ref 26.0–34.0)
MCHC: 30.2 g/dL (ref 30.0–36.0)
MCV: 103.3 fL — ABNORMAL HIGH (ref 80.0–100.0)
Monocytes Absolute: 1.2 10*3/uL — ABNORMAL HIGH (ref 0.1–1.0)
Monocytes Relative: 17 %
Neutro Abs: 4.2 10*3/uL (ref 1.7–7.7)
Neutrophils Relative %: 60 %
Platelets: 105 10*3/uL — ABNORMAL LOW (ref 150–400)
RBC: 3.01 MIL/uL — ABNORMAL LOW (ref 3.87–5.11)
RDW: 14.2 % (ref 11.5–15.5)
WBC: 7 10*3/uL (ref 4.0–10.5)
nRBC: 0 % (ref 0.0–0.2)

## 2022-10-02 LAB — BASIC METABOLIC PANEL
Anion gap: 10 (ref 5–15)
BUN: 41 mg/dL — ABNORMAL HIGH (ref 8–23)
CO2: 27 mmol/L (ref 22–32)
Calcium: 8.2 mg/dL — ABNORMAL LOW (ref 8.9–10.3)
Chloride: 96 mmol/L — ABNORMAL LOW (ref 98–111)
Creatinine, Ser: 5.19 mg/dL — ABNORMAL HIGH (ref 0.44–1.00)
GFR, Estimated: 8 mL/min — ABNORMAL LOW (ref >60)
Glucose, Bld: 84 mg/dL (ref 70–99)
Potassium: 5.2 mmol/L — ABNORMAL HIGH (ref 3.5–5.1)
Sodium: 133 mmol/L — ABNORMAL LOW (ref 135–145)

## 2022-10-02 LAB — METHYLMALONIC ACID, SERUM: Methylmalonic Acid, Quantitative: 1229 nmol/L — ABNORMAL HIGH (ref 0–378)

## 2022-10-02 MED ORDER — BISACODYL 5 MG PO TBEC: 10.0000 mg | DELAYED_RELEASE_TABLET | Freq: Every day | ORAL | 0 refills | Status: DC | PRN

## 2022-10-02 MED ORDER — MIDODRINE HCL 5 MG PO TABS
10.0000 mg | ORAL_TABLET | Freq: Once | ORAL | Status: AC
  Administered 2022-10-02: 10 mg via ORAL
  Filled 2022-10-02: qty 2

## 2022-10-02 MED ORDER — CARBAMIDE PEROXIDE 6.5 % OT SOLN: 5.0000 [drp] | Freq: Two times a day (BID) | OTIC | 0 refills | Status: AC

## 2022-10-02 MED ORDER — APIXABAN 5 MG PO TABS: 5.0000 mg | ORAL_TABLET | Freq: Two times a day (BID) | ORAL | Status: DC

## 2022-10-02 MED ORDER — ROSUVASTATIN CALCIUM 10 MG PO TABS: 10.0000 mg | ORAL_TABLET | Freq: Every day | ORAL | Status: AC

## 2022-10-02 NOTE — Discharge Summary (Addendum)
Physician Discharge Summary  Jacqueline [name] XIH:[id number] DOB: 04-11-1946 DOA: 09/28/2022  PCP: [name], FNP  Admit date: 09/28/2022 Discharge date: 10/02/2022  Admitted From: SNF Disposition:  [facility] ([facility]  Recommendations for Outpatient Follow-up:  Follow up with PCP in 1-2 weeks   Home Health:No Equipment/Devices:None   Discharge Condition:Stable  CODE STATUS:FULL Diet recommendation: Renal, with fluid restrictions  Brief/Interim Summary: 76 y.o. female with medical history significant for hypertension, end-stage renal disease secondary to diabetes mellitus (dialysis days are T/TH/S), coronary artery disease status post PCI with stent angioplasty, chronic low back pain, morbid obesity, GERD, dyslipidemia admitted for altered mental status, transient slurred speech, hypotension and bradycardia   10/29: Neurology consult, MRI brain, echo 10/30: Cardiology consult, dialysis 10/31: HD today.  Hypotensive  11/1: Remains hypotensive, but stable.  Mentals status improved.  Appropriate for dc to SNF   Discharge Diagnoses:  Principal Problem:   AMS (altered mental status) Active Problems:   Hypotension   Atrial fibrillation with slow ventricular response (HCC)   ESRD on dialysis ([place])   CAD (coronary artery disease)   Hyperkalemia   Constipation  * AMS (altered mental status) Patient was said to have an episode of unresponsiveness which was transient and related to hypotension.   No documented loss of consciousness She is back to her baseline mental status Continue midodrine  Carotid Doppler showed no significant stenosis CT head and MRI showing no acute pathology Appreciate neurology input   Hypotension Patient with history of hypertension and usually receives midodrine prior to dialysis. Had an episode of decreased responsiveness morning prior to to going to last dialysis and was noted to be hypotensive. Continue scheduled midodrine on dialysis days.  Cardiology  input appreciated Echo within normal limits Remains hypotensive.  Midodrine continued post dc     Atrial fibrillation with slow ventricular response (HCC) Appears to be new onset Patient noted to have a slow ventricular response Patient has a CHA2DS2-VASc score of 4 and started on Eliquis for long-term anticoagulation as primary prophylaxis for an acute stroke 2D echo shows normal LV systolic function Appreciate cardiology input.  Stop aspirin   Constipation On several scheduled stool softeners including Senokot-S, MiraLAX, Dulcolax.  Required soapsuds enema last night but still not feeling completely relieved.  Continue Bowel regimen post dc   Hyperkalemia 6.5-> 4.4 status post dialysis.  Scheduled lokelma stopped.  Check labs with HD   CAD (coronary artery disease) Continue Crestor.  ASA stopped.  FU OP   ESRD on dialysis [facility]) Dialysis days are T/TH/S Last HD 10/31    Discharge Instructions  Discharge Instructions     Diet - low sodium heart healthy   Complete by: As directed    Increase activity slowly   Complete by: As directed    No wound care   Complete by: As directed       Allergies as of 10/02/2022       Reactions   Other Other (See Comments)   Sulfa Antibiotics Other (See Comments)   Sulfa Drugs Cross Reactors Other (See Comments)   Doesn't remember         Medication List     STOP taking these medications    aspirin EC 81 MG tablet       TAKE these medications    acetaminophen 650 MG CR tablet Commonly known as: TYLENOL Take 1,300 mg by mouth every 8 (eight) hours as needed for pain.   albuterol 108 (90 Base) MCG/ACT inhaler Commonly known as:  VENTOLIN HFA Inhale 1-2 puffs into the lungs every 6 (six) hours as needed for wheezing or shortness of breath.   apixaban 5 MG Tabs tablet Commonly known as: ELIQUIS Take 1 tablet (5 mg total) by mouth 2 (two) times daily.   bisacodyl 5 MG EC tablet Commonly known as: DULCOLAX Take 2  tablets (10 mg total) by mouth daily as needed for moderate constipation.   carbamide peroxide 6.5 % OTIC solution Commonly known as: DEBROX Place 5 drops into the left ear 2 (two) times daily for 3 days.   cinacalcet 30 MG tablet Commonly known as: SENSIPAR Take 30 mg by mouth daily with supper.   Dialyvite/Zinc Tabs Take 1 tablet by mouth daily.   ezetimibe 10 MG tablet Commonly known as: ZETIA Take 1 tablet (10 mg total) by mouth daily.   ferric citrate 1 GM 210 MG(Fe) tablet Commonly known as: AURYXIA Take 420 mg by mouth 3 (three) times daily with meals.   fexofenadine 180 MG tablet Commonly known as: ALLEGRA Take 180 mg by mouth daily.   Fish Oil 1000 MG Cpdr Take 1,000 mg by mouth daily.   midodrine 10 MG tablet Commonly known as: PROAMATINE Take 20 mg by mouth daily. (Take additional two tablets [20mg ] for administration at dialysis on Tuesday, Thursday and Saturday)   midodrine 10 MG tablet Commonly known as: PROAMATINE Take 20 mg by mouth daily as needed (for SBP <100).   omeprazole 20 MG capsule Commonly known as: PRILOSEC Take 1 capsule (20 mg total) by mouth daily. What changed: when to take this   ondansetron 4 MG disintegrating tablet Commonly known as: ZOFRAN-ODT Take 4 mg by mouth every 8 (eight) hours as needed for nausea or vomiting.   polyethylene glycol 17 g packet Commonly known as: MIRALAX / GLYCOLAX Take 17 g by mouth at bedtime.   pregabalin 100 MG capsule Commonly known as: LYRICA Take 100 mg by mouth at bedtime.   pregabalin 50 MG capsule Commonly known as: LYRICA Take 50 mg by mouth 2 (two) times daily.   rosuvastatin 10 MG tablet Commonly known as: CRESTOR Take 1 tablet (10 mg total) by mouth daily. Start taking on: October 03, 2022 What changed:  medication strength how much to take   sennosides-docusate sodium 8.6-50 MG tablet Commonly known as: SENOKOT-S Take 1 tablet by mouth 2 (two) times daily.   trolamine  salicylate 10 % cream Commonly known as: ASPERCREME Apply 1 Application topically 2 (two) times daily. (Apply to knees)        Allergies  Allergen Reactions   Other Other (See Comments)   Sulfa Antibiotics Other (See Comments)   Sulfa Drugs Cross Reactors Other (See Comments)    Doesn't remember     Consultations: Nephrology Cardiology   Procedures/Studies: US Carotid Bilateral  Result Date: 09/30/2022 CLINICAL DATA:  76 year old female with history of transient ischemic attack. EXAM: BILATERAL CAROTID DUPLEX ULTRASOUND TECHNIQUE: Pearline Cables scale imaging, color Doppler and duplex ultrasound were performed of bilateral carotid and vertebral arteries in the neck. COMPARISON:  None Available. FINDINGS: Criteria: Quantification of carotid stenosis is based on velocity parameters that correlate the residual internal carotid diameter with NASCET-based stenosis levels, using the diameter of the distal internal carotid lumen as the denominator for stenosis measurement. The following velocity measurements were obtained: RIGHT ICA: Peak systolic velocity 68 cm/sec, End diastolic velocity 9 cm/sec CCA: Peak systolic velocity 67 cm/sec SYSTOLIC ICA/CCA RATIO:  1.0 ECA: Peak systolic velocity 710 cm/sec LEFT ICA: Peak  systolic velocity 79 cm/sec, End diastolic velocity 11 cm/sec CCA: 53 cm/sec SYSTOLIC ICA/CCA RATIO:  1.5 ECA: 86 cm/sec RIGHT CAROTID ARTERY: No atherosclerotic plaque formation. No significant tortuosity. Normal low resistance waveforms. RIGHT VERTEBRAL ARTERY:  Antegrade flow. LEFT CAROTID ARTERY: No atherosclerotic plaque formation. Tortuosity of the internal carotid artery is noted. Normal low resistance waveforms. LEFT VERTEBRAL ARTERY:  Antegrade flow. Upper extremity non-invasive blood pressures: Not obtained. IMPRESSION: 1. Right carotid artery system: Patent without significant atherosclerotic plaque formation. 2. Left carotid artery system: Patent without significant atherosclerotic  plaque formation. Tortuosity of the internal carotid artery is noted. 3.  Vertebral artery system: Patent with antegrade flow bilaterally. Ruthann Cancer, MD [facility] Radiology Electronically Signed   By: Ruthann Cancer M.D.   On: 09/30/2022 15:40   ECHOCARDIOGRAM COMPLETE  Result Date: 09/29/2022    ECHOCARDIOGRAM REPORT   Patient Name:   CHALLIS CRILL Date of Exam: 09/29/2022 Medical Rec #:  [id number]         Height:       64.0 in Accession #:    [id number]        Weight:       239.2 lb Date of Birth:  1946-02-11          BSA:          2.111 m Patient Age:    76 years          BP:           95/70 mmHg Patient Gender: F                 HR:           53 bpm. Exam Location:  Inpatient Procedure: 2D Echo, Cardiac Doppler and Color Doppler Indications:     CHF-Acute Diastolic J67.34  History:         Patient has prior history of Echocardiogram examinations, most                  recent 06/23/2019. CAD, Arrythmias:Atrial Fibrillation; Risk                  Factors:Hypertension, Diabetes and Dyslipidemia. ESRD.  Sonographer:     Darlina Sicilian RDCS Referring Phys:  [id number] AGBATA Diagnosing Phys: Eleonore Chiquito MD IMPRESSIONS  1. The left ventricle has normal function, LVEF=60-65%. The left ventricle has no regional wall motion abnormalities. There is mild asymmetric left ventricular hypertrophy. Left ventricular diastolic parameters are indeterminate.  2. Right ventricular systolic function is moderately reduced. The right ventricular size is severely enlarged. Moderately increased right ventricular wall thickness. There is mildly elevated pulmonary artery systolic pressure. The estimated right ventricular systolic pressure is 29.9 mmHg.  3. Right atrial size was mildly dilated.  4. The aortic valve is tricuspid. There is mild calcification of the aortic valve. There is mild thickening of the aortic valve. Aortic valve regurgitation is mild. Aortic valve  sclerosis is present, with no evidence of aortic valve stenosis.  5. The mitral valve is normal in structure. Mild mitral valve regurgitation. No evidence of mitral stenosis.  6. The inferior vena cava is normal in size with greater than 50% respiratory variability, suggesting right atrial pressure of 3 mmHg. Comparison(s): Changes from prior study are noted. RV now severely dilated with moderate dysfunction. FINDINGS  Left Ventricle: Left ventricular ejection fraction, by estimation, is 60 to 65%. The left ventricle has normal function. The left ventricle has no regional wall  motion abnormalities. The left ventricular internal cavity size was normal in size. There is  mild asymmetric left ventricular hypertrophy of the septal segment. Left ventricular diastolic function could not be evaluated due to atrial fibrillation. Left ventricular diastolic parameters are indeterminate. The E/e' is 11.2. Right Ventricle: The right ventricular size is severely enlarged. Moderately increased right ventricular wall thickness. Right ventricular systolic function is moderately reduced. There is mildly elevated pulmonary artery systolic pressure. The tricuspid  regurgitant velocity is 2.96 m/s, and with an assumed right atrial pressure of 3 mmHg, the estimated right ventricular systolic pressure is 93.8 mmHg. Left Atrium: Left atrial size was normal in size. Right Atrium: Right atrial size was mildly dilated. Pericardium: There is no evidence of pericardial effusion. Presence of epicardial fat layer. Mitral Valve: The mitral valve is normal in structure. Mild mitral valve regurgitation. No evidence of mitral valve stenosis. Tricuspid Valve: The tricuspid valve is normal in structure. Tricuspid valve regurgitation is mild . No evidence of tricuspid stenosis. Aortic Valve: The aortic valve is tricuspid. There is mild calcification of the aortic valve. There is mild thickening of the aortic valve. Aortic valve regurgitation is mild.  Aortic valve sclerosis is present, with no evidence of aortic valve stenosis. Aortic valve peak gradient measures 10.2 mmHg. Pulmonic Valve: The pulmonic valve was grossly normal. Pulmonic valve regurgitation is not visualized. No evidence of pulmonic stenosis. Aorta: The aortic root, ascending aorta and aortic arch are all structurally normal, with no evidence of dilitation or obstruction and the aortic root and ascending aorta are structurally normal, with no evidence of dilitation. Venous: The inferior vena cava is normal in size with greater than 50% respiratory variability, suggesting right atrial pressure of 3 mmHg. IAS/Shunts: No atrial level shunt detected by color flow Doppler.  LEFT VENTRICLE PLAX 2D LVIDd:         5.10 cm   Diastology LVIDs:         3.20 cm   LV e' medial:    8.72 cm/s LV PW:         0.70 cm   LV E/e' medial:  10.6 LV IVS:        1.30 cm   LV e' lateral:   8.19 cm/s LVOT diam:     1.80 cm   LV E/e' lateral: 11.3 LV SV:         57 LV SV Index:   27 LVOT Area:     2.54 cm  RIGHT VENTRICLE RV Basal diam:  4.70 cm RV Mid diam:    3.20 cm RV S prime:     12.80 cm/s TAPSE (M-mode): 1.8 cm LEFT ATRIUM             Index        RIGHT ATRIUM           Index LA diam:        4.90 cm 2.32 cm/m   RA Area:     23.50 cm LA Vol (A2C):   52.8 ml 25.01 ml/m  RA Volume:   81.70 ml  38.70 ml/m LA Vol (A4C):   45.3 ml 21.46 ml/m LA Biplane Vol: 48.6 ml 23.02 ml/m  AORTIC VALVE AV Area (Vmax): 1.47 cm AV Vmax:        160.00 cm/s AV Peak Grad:   10.2 mmHg LVOT Vmax:      92.60 cm/s LVOT Vmean:     61.100 cm/s LVOT VTI:       0.225 m  AORTA Ao Root diam: 2.50 cm Ao Asc diam:  3.20 cm MITRAL VALVE               TRICUSPID VALVE MV Area (PHT): 2.95 cm    TR Peak grad:   35.0 mmHg MV Decel Time: 257 msec    TR Vmax:        296.00 cm/s MV E velocity: 92.57 cm/s                            SHUNTS                            Systemic VTI:  0.22 m                            Systemic Diam: 1.80 cm Eleonore Chiquito MD  Electronically signed by Eleonore Chiquito MD Signature Date/Time: 09/29/2022/3:47:34 PM    Final    MR BRAIN WO CONTRAST  Result Date: 09/29/2022 CLINICAL DATA:  Mental status change, unknown cause. Intermittent episodes of unresponsiveness. EXAM: MRI HEAD WITHOUT CONTRAST TECHNIQUE: Multiplanar, multiecho pulse sequences of the brain and surrounding structures were obtained without intravenous contrast. COMPARISON:  CT head without contrast 09/28/2022 FINDINGS: Brain: No acute infarct, hemorrhage, or mass lesion is present. Periventricular and scattered subcortical T2 hyperintensities are mildly advanced for age. The ventricles are of normal size. No significant extraaxial fluid collection is present. Basal ganglia are within normal limits. Remote lacunar infarcts are present within the thalami bilaterally. The internal auditory canals are within normal limits. The brainstem and cerebellum are within normal limits. Vascular: Flow is present in the major intracranial arteries. Skull and upper cervical spine: The craniocervical junction is normal. Upper cervical spine is within normal limits. Marrow signal is unremarkable. Sinuses/Orbits: Bilateral mastoid effusions are present. No obstructing nasopharyngeal lesion is present. A fluid level is present in the right maxillary sinus. Paranasal sinuses are otherwise clear. Left lens replacement is present. Globes and orbits are otherwise within normal limits. Other: IMPRESSION: 1. No acute intracranial abnormality. 2. Periventricular and scattered subcortical T2 hyperintensities bilaterally are mildly advanced for age. The finding is nonspecific but can be seen in the setting of chronic microvascular ischemia, a demyelinating process such as multiple sclerosis, vasculitis, complicated migraine headaches, or as the sequelae of a prior infectious or inflammatory process. 3. Bilateral mastoid effusions. No obstructing nasopharyngeal lesion is present. Electronically  Signed   By: San Morelle M.D.   On: 09/29/2022 14:27   CT Head Wo Contrast  Result Date: 09/28/2022 CLINICAL DATA:  Mental status changes. EXAM: CT HEAD WITHOUT CONTRAST TECHNIQUE: Contiguous axial images were obtained from the base of the skull through the vertex without intravenous contrast. RADIATION DOSE REDUCTION: This exam was performed according to the departmental dose-optimization program which includes automated exposure control, adjustment of the mA and/or kV according to patient size and/or use of iterative reconstruction technique. COMPARISON:  CT head without contrast 05/27/2009 FINDINGS: Brain: Mild atrophy and white matter changes are present bilaterally. No acute infarct, hemorrhage, or mass lesion is present. The ventricles are of normal size. Deep brain nuclei are within normal limits. No significant extraaxial fluid collection is present. The brainstem and cerebellum are within normal limits. Vascular: Atherosclerotic calcifications are present within the cavernous internal carotid arteries bilaterally and at the dural margin of both vertebral arteries. No hyperdense vessel is  present. Skull: Calvarium is intact. No focal lytic or blastic lesions are present. No significant extracranial soft tissue lesion is present. Sinuses/Orbits: Chronic past occasion of the right maxillary sinus is present. Small mastoid effusions are present bilaterally. No obstructing nasopharyngeal lesion is present. The paranasal sinuses and mastoid air cells are otherwise clear. Left lens replacement is present. Globes and orbits are otherwise within normal limits. IMPRESSION: 1. Mild atrophy and white matter disease likely reflects the sequela of chronic microvascular ischemia. 2. No acute intracranial abnormality. 3. Chronic right maxillary sinus disease. 4. Small mastoid effusions bilaterally. No obstructing nasopharyngeal lesion is present. Electronically Signed   By: San Morelle M.D.   On:  09/28/2022 11:41   DG Chest 1 View  Result Date: 09/28/2022 CLINICAL DATA:  Dyspnea EXAM: CHEST  1 VIEW COMPARISON:  05/21/2022 CT scan and radiographs from 12/14/2021 FINDINGS: Mild enlargement of the cardiopericardial silhouette with upper zone pulmonary vascular prominence suggesting pulmonary venous hypertension. No overt edema. Atherosclerotic calcification of the aortic arch. Thoracic spondylosis. No discrete blunting of the costophrenic angles. Airway thickening is present, suggesting bronchitis or reactive airways disease. Small calcified granuloma at the left lung apex. IMPRESSION: 1. Mild enlargement of the cardiopericardial silhouette with pulmonary venous hypertension but no overt edema. 2. Airway thickening is present, suggesting bronchitis or reactive airways disease. 3. Thoracic spondylosis. 4. Atherosclerotic calcification of the aortic arch. Electronically Signed   By: Van Clines M.D.   On: 09/28/2022 11:05      Subjective: Seen and examined on day of dc.  Stable, no distress  Discharge Exam: Vitals:   10/02/22 0503 10/02/22 0839  BP: (!) 88/44 (!) 85/42  Pulse: (!) 55 (!) 57  Resp:  14  Temp: 98 F (36.7 C) 98.8 F (37.1 C)  SpO2: 99% 90%   Vitals:   10/01/22 1551 10/01/22 2144 10/02/22 0503 10/02/22 0839  BP: 118/63 (!) 91/39 (!) 88/44 (!) 85/42  Pulse: 68 (!) 59 (!) 55 (!) 57  Resp: 18 18  14   Temp: 98.3 F (36.8 C) 98.3 F (36.8 C) 98 F (36.7 C) 98.8 F (37.1 C)  TempSrc: Oral Oral Oral   SpO2: 97% 93% 99% 90%  Weight:      Height:        General: Pt is alert, awake, not in acute distress Cardiovascular: RRR, S1/S2 +, no rubs, no gallops Respiratory: CTA bilaterally, no wheezing, no rhonchi Abdominal: Soft, NT, ND, bowel sounds + Extremities: no edema, no cyanosis    The results of significant diagnostics from this hospitalization (including imaging, microbiology, ancillary and laboratory) are listed below for reference.      Microbiology: Recent Results (from the past 240 hour(s))  Resp Panel by RT-PCR (Flu A&B, Covid) Anterior Nasal Swab     Status: None   Collection Time: 09/28/22 11:39 AM   Specimen: Anterior Nasal Swab  Result Value Ref Range Status   SARS Coronavirus 2 by RT PCR NEGATIVE NEGATIVE Final    Comment: (NOTE) SARS-CoV-2 target nucleic acids are NOT DETECTED.  The SARS-CoV-2 RNA is generally detectable in upper respiratory specimens during the acute phase of infection. The lowest concentration of SARS-CoV-2 viral copies this assay can detect is 138 copies/mL. A negative result does not preclude SARS-Cov-2 infection and should not be used as the sole basis for treatment or other patient management decisions. A negative result may occur with  improper specimen collection/handling, submission of specimen other than nasopharyngeal swab, presence of viral mutation(s) within the areas  targeted by this assay, and inadequate number of viral copies(<138 copies/mL). A negative result must be combined with clinical observations, patient history, and epidemiological information. The expected result is Negative.  Fact Sheet for Patients:  EntrepreneurPulse.com.au  Fact Sheet for Healthcare Providers:  IncredibleEmployment.be  This test is no t yet approved or cleared by the [place] FDA and  has been authorized for detection and/or diagnosis of SARS-CoV-2 by FDA under an Emergency Use Authorization (EUA). This EUA will remain  in effect (meaning this test can be used) for the duration of the COVID-19 declaration under Section 564(b)(1) of the Act, 21 U.S.C.section 360bbb-3(b)(1), unless the authorization is terminated  or revoked sooner.       Influenza A by PCR NEGATIVE NEGATIVE Final   Influenza B by PCR NEGATIVE NEGATIVE Final    Comment: (NOTE) The Xpert Xpress SARS-CoV-2/FLU/RSV plus assay is intended as an aid in the diagnosis of influenza  from Nasopharyngeal swab specimens and should not be used as a sole basis for treatment. Nasal washings and aspirates are unacceptable for Xpert Xpress SARS-CoV-2/FLU/RSV testing.  Fact Sheet for Patients: EntrepreneurPulse.com.au  Fact Sheet for Healthcare Providers: IncredibleEmployment.be  This test is not yet approved or cleared by the [place] FDA and has been authorized for detection and/or diagnosis of SARS-CoV-2 by FDA under an Emergency Use Authorization (EUA). This EUA will remain in effect (meaning this test can be used) for the duration of the COVID-19 declaration under Section 564(b)(1) of the Act, 21 U.S.C. section 360bbb-3(b)(1), unless the authorization is terminated or revoked.  Performed at [facility], [address] [place], [place] [address]      Labs: BNP (last 3 results) Recent Labs    12/14/21 1816 02/25/22 1632 05/21/22 1931  BNP 932.4* 157.2* 706.2*   Basic Metabolic Panel: Recent Labs  Lab 09/28/22 1059 09/29/22 0451 09/29/22 1058 09/30/22 0628 09/30/22 1719 10/01/22 0726  NA 134* 135  --  136  --  134*  K 5.4* 5.8* 5.3* 6.4* 3.9 4.4  CL 94* 95*  --  96*  --  93*  CO2 27 27  --  26  --  27  GLUCOSE 94 95  --  87  --  80  BUN 57* 71*  --  90*  --  43*  CREATININE 7.07* 8.33*  --  9.67*  --  6.03*  CALCIUM 8.9 8.6*  --  8.4*  --  8.4*  MG 2.0  --   --   --   --   --   PHOS  --   --   --   --   --  3.6   Liver Function Tests: Recent Labs  Lab 09/28/22 1059 10/01/22 0726  AST 32  --   ALT 23  --   ALKPHOS 83  --   BILITOT 0.7  --   PROT 8.0  --   ALBUMIN 3.7 3.7   No results for input(s): "LIPASE", "AMYLASE" in the last 168 hours. No results for input(s): "AMMONIA" in the last 168 hours. CBC: Recent Labs  Lab 09/28/22 1027 09/29/22 0451 09/30/22 0628 10/01/22 0929  WBC 10.0 9.4 8.2 9.7  NEUTROABS 6.2  --   --  7.8*  HGB 11.3* 10.5* 9.5* 9.7*  HCT 37.3 33.9* 31.2*  30.8*  MCV 102.5* 100.3* 102.0* 100.3*  PLT 157 154 123* 143*   Cardiac Enzymes: No results for input(s): "CKTOTAL", "CKMB", "CKMBINDEX", "TROPONINI" in the last 168 hours. BNP: Invalid input(s): "  POCBNP" CBG: Recent Labs  Lab 09/28/22 2046  GLUCAP 148*   D-Dimer No results for input(s): "DDIMER" in the last 72 hours. Hgb A1c Recent Labs    09/29/22 1655  HGBA1C 5.2   Lipid Profile Recent Labs    09/29/22 1655  CHOL 101  HDL 32*  LDLCALC 49  TRIG 100  CHOLHDL 3.2   Thyroid function studies No results for input(s): "TSH", "T4TOTAL", "T3FREE", "THYROIDAB" in the last 72 hours.  Invalid input(s): "FREET3" Anemia work up Recent Labs    09/30/22 0628  VITAMINB12 658   Urinalysis    Component Value Date/Time   COLORURINE YELLOW 01/25/2014 1903   APPEARANCEUR TURBID (A) 01/25/2014 1903   LABSPEC 1.020 01/25/2014 1903   PHURINE 5.0 01/25/2014 1903   GLUCOSEU NEGATIVE 01/25/2014 1903   HGBUR LARGE (A) 01/25/2014 1903   BILIRUBINUR SMALL (A) 01/25/2014 1903   KETONESUR NEGATIVE 01/25/2014 1903   PROTEINUR 100 (A) 01/25/2014 1903   UROBILINOGEN 1.0 01/25/2014 1903   NITRITE NEGATIVE 01/25/2014 1903   LEUKOCYTESUR LARGE (A) 01/25/2014 1903   Sepsis Labs Recent Labs  Lab 09/28/22 1027 09/29/22 0451 09/30/22 0628 10/01/22 0929  WBC 10.0 9.4 8.2 9.7   Microbiology Recent Results (from the past 240 hour(s))  Resp Panel by RT-PCR (Flu A&B, Covid) Anterior Nasal Swab     Status: None   Collection Time: 09/28/22 11:39 AM   Specimen: Anterior Nasal Swab  Result Value Ref Range Status   SARS Coronavirus 2 by RT PCR NEGATIVE NEGATIVE Final    Comment: (NOTE) SARS-CoV-2 target nucleic acids are NOT DETECTED.  The SARS-CoV-2 RNA is generally detectable in upper respiratory specimens during the acute phase of infection. The lowest concentration of SARS-CoV-2 viral copies this assay can detect is 138 copies/mL. A negative result does not preclude  SARS-Cov-2 infection and should not be used as the sole basis for treatment or other patient management decisions. A negative result may occur with  improper specimen collection/handling, submission of specimen other than nasopharyngeal swab, presence of viral mutation(s) within the areas targeted by this assay, and inadequate number of viral copies(<138 copies/mL). A negative result must be combined with clinical observations, patient history, and epidemiological information. The expected result is Negative.  Fact Sheet for Patients:  EntrepreneurPulse.com.au  Fact Sheet for Healthcare Providers:  IncredibleEmployment.be  This test is no t yet approved or cleared by the [place] FDA and  has been authorized for detection and/or diagnosis of SARS-CoV-2 by FDA under an Emergency Use Authorization (EUA). This EUA will remain  in effect (meaning this test can be used) for the duration of the COVID-19 declaration under Section 564(b)(1) of the Act, 21 U.S.C.section 360bbb-3(b)(1), unless the authorization is terminated  or revoked sooner.       Influenza A by PCR NEGATIVE NEGATIVE Final   Influenza B by PCR NEGATIVE NEGATIVE Final    Comment: (NOTE) The Xpert Xpress SARS-CoV-2/FLU/RSV plus assay is intended as an aid in the diagnosis of influenza from Nasopharyngeal swab specimens and should not be used as a sole basis for treatment. Nasal washings and aspirates are unacceptable for Xpert Xpress SARS-CoV-2/FLU/RSV testing.  Fact Sheet for Patients: EntrepreneurPulse.com.au  Fact Sheet for Healthcare Providers: IncredibleEmployment.be  This test is not yet approved or cleared by the [place] FDA and has been authorized for detection and/or diagnosis of SARS-CoV-2 by FDA under an Emergency Use Authorization (EUA). This EUA will remain in effect (meaning this test can be used) for the duration  of  the COVID-19 declaration under Section 564(b)(1) of the Act, 21 U.S.C. section 360bbb-3(b)(1), unless the authorization is terminated or revoked.  Performed at [facility], [address] [place], [place] [address]      Time coordinating discharge: Over 30 minutes  SIGNED:   Sidney Ace, MD  [facility] 10/02/2022, 9:20 AM Pager   If 7PM-7AM, please contact night-coverage

## 2022-10-02 NOTE — Progress Notes (Signed)
[facility]  ROUNDING NOTE   Subjective:   Patient seen sitting up in bed, completed breakfast tray Alert and oriented Denies pain or discomfort Remains on room air Dialysis received yesterday, tolerated well  Objective:  Vital signs in last 24 hours:  Temp:  [98 F (36.7 C)-98.8 F (37.1 C)] 98.8 F (37.1 C) (11/01 0839) Pulse Rate:  [55-68] 57 (11/01 0839) Resp:  [13-20] 14 (11/01 0839) BP: (85-118)/(39-63) 85/42 (11/01 0839) SpO2:  [90 %-100 %] 90 % (11/01 0839) Weight:  [108.7 kg] 108.7 kg (10/31 1301)  Weight change: -7.9 kg Filed Weights   10/01/22 0910 10/01/22 0924 10/01/22 1301  Weight: 108.8 kg 108.8 kg 108.7 kg    Intake/Output: I/O last 3 completed shifts: In: 480 [P.O.:480] Out: 500 [Other:500]   Intake/Output this shift:  No intake/output data recorded.  Physical Exam: General: NAD  Head: Normocephalic, atraumatic. Moist oral mucosal membranes  Eyes: Anicteric  Lungs:  Clear to auscultation, normal effort  Heart: Irregular   Abdomen:  Soft, nontender  Extremities:  no peripheral edema.  Neurologic: Nonfocal, moving all four extremities  Skin: No lesions  Access: Left upper AVF     Basic Metabolic Panel: Recent Labs  Lab 09/28/22 1059 09/29/22 0451 09/29/22 1058 09/30/22 0628 09/30/22 1719 10/01/22 0726 10/02/22 0851  NA 134* 135  --  136  --  134* 133*  K 5.4* 5.8* 5.3* 6.4* 3.9 4.4 5.2*  CL 94* 95*  --  96*  --  93* 96*  CO2 27 27  --  26  --  27 27  GLUCOSE 94 95  --  87  --  80 84  BUN 57* 71*  --  90*  --  43* 41*  CREATININE 7.07* 8.33*  --  9.67*  --  6.03* 5.19*  CALCIUM 8.9 8.6*  --  8.4*  --  8.4* 8.2*  MG 2.0  --   --   --   --   --   --   PHOS  --   --   --   --   --  3.6  --      Liver Function Tests: Recent Labs  Lab 09/28/22 1059 10/01/22 0726  AST 32  --   ALT 23  --   ALKPHOS 83  --   BILITOT 0.7  --   PROT 8.0  --   ALBUMIN 3.7 3.7    No results for input(s): "LIPASE", "AMYLASE" in the  last 168 hours. No results for input(s): "AMMONIA" in the last 168 hours.  CBC: Recent Labs  Lab 09/28/22 1027 09/29/22 0451 09/30/22 0628 10/01/22 0929 10/02/22 0851  WBC 10.0 9.4 8.2 9.7 7.0  NEUTROABS 6.2  --   --  7.8* 4.2  HGB 11.3* 10.5* 9.5* 9.7* 9.4*  HCT 37.3 33.9* 31.2* 30.8* 31.1*  MCV 102.5* 100.3* 102.0* 100.3* 103.3*  PLT 157 154 123* 143* 105*     Cardiac Enzymes: No results for input(s): "CKTOTAL", "CKMB", "CKMBINDEX", "TROPONINI" in the last 168 hours.  BNP: Invalid input(s): "POCBNP"  CBG: Recent Labs  Lab 09/28/22 2046  GLUCAP 148*     Microbiology: Results for orders placed or performed during the hospital encounter of 09/28/22  Resp Panel by RT-PCR (Flu A&B, Covid) Anterior Nasal Swab     Status: None   Collection Time: 09/28/22 11:39 AM   Specimen: Anterior Nasal Swab  Result Value Ref Range Status   SARS Coronavirus 2 by RT PCR NEGATIVE NEGATIVE  Final    Comment: (NOTE) SARS-CoV-2 target nucleic acids are NOT DETECTED.  The SARS-CoV-2 RNA is generally detectable in upper respiratory specimens during the acute phase of infection. The lowest concentration of SARS-CoV-2 viral copies this assay can detect is 138 copies/mL. A negative result does not preclude SARS-Cov-2 infection and should not be used as the sole basis for treatment or other patient management decisions. A negative result may occur with  improper specimen collection/handling, submission of specimen other than nasopharyngeal swab, presence of viral mutation(s) within the areas targeted by this assay, and inadequate number of viral copies(<138 copies/mL). A negative result must be combined with clinical observations, patient history, and epidemiological information. The expected result is Negative.  Fact Sheet for Patients:  EntrepreneurPulse.com.au  Fact Sheet for Healthcare Providers:  IncredibleEmployment.be  This test is no t yet  approved or cleared by the [place] FDA and  has been authorized for detection and/or diagnosis of SARS-CoV-2 by FDA under an Emergency Use Authorization (EUA). This EUA will remain  in effect (meaning this test can be used) for the duration of the COVID-19 declaration under Section 564(b)(1) of the Act, 21 U.S.C.section 360bbb-3(b)(1), unless the authorization is terminated  or revoked sooner.       Influenza A by PCR NEGATIVE NEGATIVE Final   Influenza B by PCR NEGATIVE NEGATIVE Final    Comment: (NOTE) The Xpert Xpress SARS-CoV-2/FLU/RSV plus assay is intended as an aid in the diagnosis of influenza from Nasopharyngeal swab specimens and should not be used as a sole basis for treatment. Nasal washings and aspirates are unacceptable for Xpert Xpress SARS-CoV-2/FLU/RSV testing.  Fact Sheet for Patients: EntrepreneurPulse.com.au  Fact Sheet for Healthcare Providers: IncredibleEmployment.be  This test is not yet approved or cleared by the [place] FDA and has been authorized for detection and/or diagnosis of SARS-CoV-2 by FDA under an Emergency Use Authorization (EUA). This EUA will remain in effect (meaning this test can be used) for the duration of the COVID-19 declaration under Section 564(b)(1) of the Act, 21 U.S.C. section 360bbb-3(b)(1), unless the authorization is terminated or revoked.  Performed at [facility], [address] [place], [place] [address]     Coagulation Studies: No results for input(s): "LABPROT", "INR" in the last 72 hours.  Urinalysis: No results for input(s): "COLORURINE", "LABSPEC", "PHURINE", "GLUCOSEU", "HGBUR", "BILIRUBINUR", "KETONESUR", "PROTEINUR", "UROBILINOGEN", "NITRITE", "LEUKOCYTESUR" in the last 72 hours.  Invalid input(s): "APPERANCEUR"    Imaging: US Carotid Bilateral  Result Date: 09/30/2022 CLINICAL DATA:  76 year old female with history of transient ischemic attack.  EXAM: BILATERAL CAROTID DUPLEX ULTRASOUND TECHNIQUE: Pearline Cables scale imaging, color Doppler and duplex ultrasound were performed of bilateral carotid and vertebral arteries in the neck. COMPARISON:  None Available. FINDINGS: Criteria: Quantification of carotid stenosis is based on velocity parameters that correlate the residual internal carotid diameter with NASCET-based stenosis levels, using the diameter of the distal internal carotid lumen as the denominator for stenosis measurement. The following velocity measurements were obtained: RIGHT ICA: Peak systolic velocity 68 cm/sec, End diastolic velocity 9 cm/sec CCA: Peak systolic velocity 67 cm/sec SYSTOLIC ICA/CCA RATIO:  1.0 ECA: Peak systolic velocity 967 cm/sec LEFT ICA: Peak systolic velocity 79 cm/sec, End diastolic velocity 11 cm/sec CCA: 53 cm/sec SYSTOLIC ICA/CCA RATIO:  1.5 ECA: 86 cm/sec RIGHT CAROTID ARTERY: No atherosclerotic plaque formation. No significant tortuosity. Normal low resistance waveforms. RIGHT VERTEBRAL ARTERY:  Antegrade flow. LEFT CAROTID ARTERY: No atherosclerotic plaque formation. Tortuosity of the internal carotid artery is noted. Normal low  resistance waveforms. LEFT VERTEBRAL ARTERY:  Antegrade flow. Upper extremity non-invasive blood pressures: Not obtained. IMPRESSION: 1. Right carotid artery system: Patent without significant atherosclerotic plaque formation. 2. Left carotid artery system: Patent without significant atherosclerotic plaque formation. Tortuosity of the internal carotid artery is noted. 3.  Vertebral artery system: Patent with antegrade flow bilaterally. Ruthann Cancer, MD [facility] Electronically Signed   By: Ruthann Cancer M.D.   On: 09/30/2022 15:40     Medications:     apixaban  5 mg Oral BID   bisacodyl  10 mg Oral Daily   carbamide peroxide  5 drop Left EAR BID   Chlorhexidine Gluconate Cloth  6 each Topical Q0600   cholecalciferol  2,000 Units  Oral Daily   cinacalcet  30 mg Oral Q lunch   ezetimibe  10 mg Oral Daily   ferric citrate  420 mg Oral TID WC   midodrine  10 mg Oral 2 times per day on Tue Thu Sat   multivitamin  1 tablet Oral Daily   pantoprazole  40 mg Oral Daily   polyethylene glycol  17 g Oral Daily   pregabalin  100 mg Oral QHS   pregabalin  50 mg Oral BID   rosuvastatin  10 mg Oral Daily   senna-docusate  2 tablet Oral BID   sodium zirconium cyclosilicate  10 g Oral Daily   acetaminophen **OR** acetaminophen, albuterol, ondansetron **OR** ondansetron (ZOFRAN) IV  Assessment/ Plan:  [name] is a 76 y.o.  female  with past medical conditions including hyperlipidemia, CAD with stent, arthritis, hypertension, diabetes, GERD, obesity, chronic wounds, end-stage renal disease on hemodialysis who was admitted to [facility] on 10/28 for Hypotension, unspecified hypotension type [I95.9] AMS (altered mental status) [R41.82] Hyperkalemia [E87.5]   [facility]/TTS/left aVF  ESRD on HD with hypotension followed by [facility] at [facility]. TTS  Dialysis received yesterday, UF goal 500 mL achieved.  Patient tolerated treatment well, maintaining blood pressure with acceptable heart rate.  Next treatment scheduled for Thursday.  Patient cleared to discharge from renal stance and continue treatments at outpatient clinic.   2. Hyperkalemia -Potassium slightly elevated, 5.2.  Patient received Lokelma.  3. Anemia of CKD  -Hemoglobin remained stable, 9.4.   4. Secondary hyperparathroidism  On sensipar 30 mg daily with lunch and cholecalciferol. Bone minerals remain acceptable.    LOS: 2 Brianah Hopson 11/1/202311:54 AM

## 2022-10-02 NOTE — TOC Transition Note (Addendum)
Transition of Care [facility]) - CM/SW Discharge Note   Patient Details  Name: Jacqueline [name] MRN: [id number] Date of Birth: [birth year]  Transition of Care [facility]) CM/SW Contact:  Alberteen Sam, LCSW Phone Number: 10/02/2022, 9:54 AM   Clinical Narrative:     Patient will DC to: [facility]  Attempted to notify son, vm full Anticipated DC date: 10/02/22 Transport by: Johnanna Schneiders  Per MD patient ready for DC to [facility] . RN, patient, patient's family, and facility notified of DC. Discharge Summary sent to facility. RN given number for report  [phone]. DC packet on chart. Ambulance transport requested for patient.  CSW signing off.  Pricilla Riffle, LCSW    Final next level of care: Skilled Nursing Facility Barriers to Discharge: No Barriers Identified   Patient Goals and CMS Choice Patient states their goals for this hospitalization and ongoing recovery are:: to go home CMS Medicare.gov Compare Post Acute Care list provided to:: Patient Choice offered to / list presented to : Patient  Discharge Placement                       Discharge Plan and Services     Post Acute Care Choice: Resumption of Svcs/PTA Provider                               Social Determinants of Health (SDOH) Interventions     Readmission Risk Interventions    05/22/2022   12:26 PM  Readmission Risk Prevention Plan  Transportation Screening Complete  PCP or Specialist Appt within 3-5 Days Complete  HRI or [place] Complete  Social Work Consult for [place] Planning/Counseling Complete  Palliative Care Screening Complete  Medication Review [employer]) Complete

## 2022-10-03 ENCOUNTER — Telehealth: Payer: Self-pay

## 2022-10-03 NOTE — Telephone Encounter (Signed)
Transition Care Management Unsuccessful Follow-up Telephone Call  Date of discharge and from where:  [place] 10/02/2022  Attempts:  2nd Attempt  Reason for unsuccessful TCM follow-up call:  Left voice message Juanda Crumble, [place] Direct Dial [phone]

## 2022-10-03 NOTE — Telephone Encounter (Signed)
Transition Care Management Unsuccessful Follow-up Telephone Call  Date of discharge and from where:  [place] 10/02/2022  Attempts:  1st Attempt  Reason for unsuccessful TCM follow-up call:  Left voice message Juanda Crumble, [place] Direct Dial [phone]

## 2022-10-04 ENCOUNTER — Telehealth: Payer: Self-pay

## 2022-10-04 NOTE — Telephone Encounter (Signed)
Patient now in SNF

## 2022-10-04 NOTE — Telephone Encounter (Signed)
Spoke with Patient son. She is now living at [facility] in [place]. She has been there since June 29th. Remove JM from pcp.

## 2022-12-26 ENCOUNTER — Other Ambulatory Visit: Payer: Self-pay

## 2022-12-26 ENCOUNTER — Emergency Department
Admission: EM | Admit: 2022-12-26 | Discharge: 2022-12-26 | Disposition: A | Discharge status: Skilled Nursing Facility | Payer: Medicare Other | Attending: Emergency Medicine | Admitting: Emergency Medicine

## 2022-12-26 DIAGNOSIS — R0602 Shortness of breath: Secondary | ICD-10-CM | POA: Diagnosis not present

## 2022-12-26 DIAGNOSIS — I959 Hypotension, unspecified: Secondary | ICD-10-CM | POA: Diagnosis present

## 2022-12-26 DIAGNOSIS — Z992 Dependence on renal dialysis: Secondary | ICD-10-CM | POA: Insufficient documentation

## 2022-12-26 DIAGNOSIS — N186 End stage renal disease: Secondary | ICD-10-CM | POA: Insufficient documentation

## 2022-12-26 DIAGNOSIS — E860 Dehydration: Secondary | ICD-10-CM

## 2022-12-26 LAB — CBC WITH DIFFERENTIAL/PLATELET
Abs Immature Granulocytes: 0.03 10*3/uL (ref 0.00–0.07)
Basophils Absolute: 0.1 10*3/uL (ref 0.0–0.1)
Basophils Relative: 1 %
Eosinophils Absolute: 0.2 10*3/uL (ref 0.0–0.5)
Eosinophils Relative: 3 %
HCT: 39.8 % (ref 36.0–46.0)
Hemoglobin: 12.1 g/dL (ref 12.0–15.0)
Immature Granulocytes: 0 %
Lymphocytes Relative: 18 %
Lymphs Abs: 1.5 10*3/uL (ref 0.7–4.0)
MCH: 29.8 pg (ref 26.0–34.0)
MCHC: 30.4 g/dL (ref 30.0–36.0)
MCV: 98 fL (ref 80.0–100.0)
Monocytes Absolute: 0.8 10*3/uL (ref 0.1–1.0)
Monocytes Relative: 10 %
Neutro Abs: 5.6 10*3/uL (ref 1.7–7.7)
Neutrophils Relative %: 68 %
Platelets: 108 10*3/uL — ABNORMAL LOW (ref 150–400)
RBC: 4.06 MIL/uL (ref 3.87–5.11)
RDW: 16 % — ABNORMAL HIGH (ref 11.5–15.5)
WBC: 8.2 10*3/uL (ref 4.0–10.5)
nRBC: 0 % (ref 0.0–0.2)

## 2022-12-26 LAB — COMPREHENSIVE METABOLIC PANEL
ALT: 16 U/L (ref 0–44)
AST: 29 U/L (ref 15–41)
Albumin: 3.8 g/dL (ref 3.5–5.0)
Alkaline Phosphatase: 88 U/L (ref 38–126)
Anion gap: 11 (ref 5–15)
BUN: 16 mg/dL (ref 8–23)
CO2: 31 mmol/L (ref 22–32)
Calcium: 8.6 mg/dL — ABNORMAL LOW (ref 8.9–10.3)
Chloride: 92 mmol/L — ABNORMAL LOW (ref 98–111)
Creatinine, Ser: 3.5 mg/dL — ABNORMAL HIGH (ref 0.44–1.00)
GFR, Estimated: 13 mL/min — ABNORMAL LOW (ref >60)
Glucose, Bld: 111 mg/dL — ABNORMAL HIGH (ref 70–99)
Potassium: 3.8 mmol/L (ref 3.5–5.1)
Sodium: 134 mmol/L — ABNORMAL LOW (ref 135–145)
Total Bilirubin: 0.7 mg/dL (ref 0.3–1.2)
Total Protein: 8.6 g/dL — ABNORMAL HIGH (ref 6.5–8.1)

## 2022-12-26 LAB — BRAIN NATRIURETIC PEPTIDE: B Natriuretic Peptide: 726 pg/mL — ABNORMAL HIGH (ref 0.0–100.0)

## 2022-12-26 MED ORDER — SODIUM CHLORIDE 0.9 % IV BOLUS
500.0000 mL | Freq: Once | INTRAVENOUS | Status: AC
  Administered 2022-12-26: 500 mL via INTRAVENOUS

## 2022-12-26 NOTE — ED Provider Notes (Signed)
[facility] Provider Note   Event Date/Time   First MD Initiated Contact with Patient 12/26/22 1402     (approximate) History  Hypotension  HPI Jacqueline [name] is a 77 y.o. female with a stated past medical history of end-stage renal disease on dialysis Monday/Wednesday/Friday however recently changed this week to Tuesday/Thursday who presents via EMS for hypotension and diaphoresis as well as shortness of breath.  Patient states that she only was able to tolerate 2 hours of dialysis prior to the symptoms occurring.  Patient was placed on 3 L nasal cannula and states that her shortness of breath is much improved.  EMS noted patient's initial blood pressure to be with systolics in the 106Y but unfortunately declined to the 80s during transport.  Patient was given 300 cc of normal saline en route by EMS. ROS: Patient currently denies any vision changes, tinnitus, difficulty speaking, facial droop, sore throat, chest pain, abdominal pain, nausea/vomiting/diarrhea, dysuria, or weakness/numbness/paresthesias in any extremity   Physical Exam  Triage Vital Signs: ED Triage Vitals  Enc Vitals Group     BP 12/26/22 1359 (!) 85/29     Pulse Rate 12/26/22 1406 61     Resp 12/26/22 1359 14     Temp 12/26/22 1359 97.9 F (36.6 C)     Temp Source 12/26/22 1359 Oral     SpO2 12/26/22 1359 95 %     Weight --      Height --      Head Circumference --      [facility] Flow --      Pain Score 12/26/22 1357 0     Pain Loc --      Pain Edu? --      Excl. in [place]? --    Most recent vital signs: Vitals:   12/26/22 1515 12/26/22 1515  BP:  90/62  Pulse:    Resp:    Temp:    SpO2: 100%    General: Awake, oriented x4. CV:  Good peripheral perfusion.  Resp:  Normal effort.  Clear to auscultation bilaterally Abd:  No distention.  Other:  Elderly obese African-American female laying in bed in no acute distress with 3 L nasal cannula in place.  AV fistula accessed in the left upper  extremity ED Results / Procedures / Treatments  Labs (all labs ordered are listed, but only abnormal results are displayed) Labs Reviewed  CBC WITH DIFFERENTIAL/PLATELET - Abnormal; Notable for the following components:      Result Value   RDW 16.0 (*)    Platelets 108 (*)    All other components within normal limits  COMPREHENSIVE METABOLIC PANEL  BRAIN NATRIURETIC PEPTIDE   EKG ED ECG REPORT I, Naaman Plummer, the attending physician, personally viewed and interpreted this ECG. Date: 12/26/2022 EKG Time: 1400 Rate: 62 Rhythm: Atrial fibrillation QRS Axis: normal Intervals: normal ST/T Wave abnormalities: normal Narrative Interpretation: Atrial fibrillation.  No evidence of acute ischemia PROCEDURES: Critical Care performed: No Procedures MEDICATIONS ORDERED IN ED: Medications  sodium chloride 0.9 % bolus 500 mL (500 mLs Intravenous New Bag/Given 12/26/22 1441)   IMPRESSION / MDM / ASSESSMENT AND PLAN / ED COURSE  I reviewed the triage vital signs and the nursing notes.                             The patient is on the cardiac monitor to evaluate for evidence of arrhythmia and/or  significant heart rate changes. Patient's presentation is most consistent with acute presentation with potential threat to life or bodily function. Patient is a 77 year old female who presents for hypotension and shortness of breath from dialysis.  Patient does not have any chest pain at this time.  Patient's shortness of breath relieved with 2 L of oxygen.  Upon further questioning, patient does wear oxygen at night occasionally.  Patient states that she has similar symptoms when they have "taken too much off" in the past.  Patient states that she does feel much better after the 300 cc bolus en route. Patient is pending laboratory evaluation as well as fluid bolus and reevaluation prior to discharge.  Care of this patient will be signed out to the oncoming physician at the end of my shift.  All  pertinent patient information conveyed and all questions answered.  All further care and disposition decisions will be made by the oncoming physician.    FINAL CLINICAL IMPRESSION(S) / ED DIAGNOSES   Final diagnoses:  Hypotension, unspecified hypotension type   Rx / DC Orders   ED Discharge Orders     None      Note:  This document was prepared using Dragon voice recognition software and may include unintentional dictation errors.   Naaman Plummer, MD 12/26/22 [phone]

## 2022-12-26 NOTE — ED Notes (Signed)
[facility]  CALLED  FOR  TRANSPORT  TO  [facility] 

## 2022-12-26 NOTE — ED Triage Notes (Signed)
Pt presents to ED via [facility] with c/o of hypotension and some confusion during dialysis. Pt has dialysis Tues, Thurs, Sat. Pt had 2 out of 3.5 hours of dialysis TX done.   CBG 122

## 2023-02-11 ENCOUNTER — Other Ambulatory Visit: Payer: Self-pay | Admitting: Nephrology

## 2023-02-11 DIAGNOSIS — Z87898 Personal history of other specified conditions: Secondary | ICD-10-CM

## 2023-02-14 ENCOUNTER — Ambulatory Visit
Admission: RE | Admit: 2023-02-14 | Discharge: 2023-02-14 | Disposition: A | Discharge status: Home / Self Care | Payer: Medicare Other | Source: Ambulatory Visit | Attending: Nephrology | Admitting: Nephrology

## 2023-02-14 DIAGNOSIS — Z87898 Personal history of other specified conditions: Secondary | ICD-10-CM | POA: Insufficient documentation

## 2023-02-25 ENCOUNTER — Ambulatory Visit: Payer: BC Managed Care – PPO | Admitting: Internal Medicine

## 2023-03-28 ENCOUNTER — Other Ambulatory Visit (INDEPENDENT_AMBULATORY_CARE_PROVIDER_SITE_OTHER): Payer: Self-pay | Admitting: Vascular Surgery

## 2023-03-28 DIAGNOSIS — N186 End stage renal disease: Secondary | ICD-10-CM

## 2023-03-31 ENCOUNTER — Encounter (INDEPENDENT_AMBULATORY_CARE_PROVIDER_SITE_OTHER): Payer: BC Managed Care – PPO | Admitting: Vascular Surgery

## 2023-03-31 ENCOUNTER — Encounter (INDEPENDENT_AMBULATORY_CARE_PROVIDER_SITE_OTHER): Payer: BC Managed Care – PPO

## 2023-04-04 ENCOUNTER — Telehealth: Payer: Self-pay | Admitting: Cardiovascular Disease

## 2023-04-04 NOTE — Telephone Encounter (Signed)
STAT if HR is under 50 or over 120 (normal HR is 60-100 beats per minute)  What is your heart rate?  Unknown  Do you have a log of your heart rate readings (document readings)?   No  Do you have any other symptoms?  Sometime patient has syncope during dialysis  Son stated patient is in a long-term care nursing home and at dialysis her HR has been showing low readings and BP drops to as low 70/30 on several occasions.

## 2023-04-04 NOTE — Telephone Encounter (Signed)
Spoke with patient's son. Patient's son reported that patient's blood pressure and heart rate are dropping during dialysis and that the patient has had syncopal episodes. Patient's son said that dialysis clinic recommended for the patient to follow up with her cardiologist. Patient was last seen in May of 2023. Patient is scheduled for be seen 5/8.

## 2023-04-09 ENCOUNTER — Ambulatory Visit: Payer: BC Managed Care – PPO | Attending: Cardiology | Admitting: Cardiology

## 2023-04-09 ENCOUNTER — Ambulatory Visit (INDEPENDENT_AMBULATORY_CARE_PROVIDER_SITE_OTHER): Payer: BC Managed Care – PPO

## 2023-04-09 VITALS — BP 91/56 | HR 87 | Ht 65.0 in | Wt 242.1 lb

## 2023-04-09 DIAGNOSIS — I480 Paroxysmal atrial fibrillation: Secondary | ICD-10-CM

## 2023-04-09 DIAGNOSIS — Z9861 Coronary angioplasty status: Secondary | ICD-10-CM

## 2023-04-09 DIAGNOSIS — R55 Syncope and collapse: Secondary | ICD-10-CM

## 2023-04-09 DIAGNOSIS — Z992 Dependence on renal dialysis: Secondary | ICD-10-CM

## 2023-04-09 DIAGNOSIS — I2510 Atherosclerotic heart disease of native coronary artery without angina pectoris: Secondary | ICD-10-CM

## 2023-04-09 DIAGNOSIS — I4891 Unspecified atrial fibrillation: Secondary | ICD-10-CM

## 2023-04-09 DIAGNOSIS — N186 End stage renal disease: Secondary | ICD-10-CM

## 2023-04-09 DIAGNOSIS — R001 Bradycardia, unspecified: Secondary | ICD-10-CM

## 2023-04-09 DIAGNOSIS — E785 Hyperlipidemia, unspecified: Secondary | ICD-10-CM

## 2023-04-09 DIAGNOSIS — I959 Hypotension, unspecified: Secondary | ICD-10-CM

## 2023-04-09 DIAGNOSIS — I739 Peripheral vascular disease, unspecified: Secondary | ICD-10-CM

## 2023-04-09 DIAGNOSIS — E782 Mixed hyperlipidemia: Secondary | ICD-10-CM

## 2023-04-09 NOTE — Patient Instructions (Signed)
Medication Instructions:  Your Physician recommend you continue on your current medication as directed.    *If you need a refill on your cardiac medications before your next appointment, please call your pharmacy*   Lab Work: No labs ordered today.  If you have labs (blood work) drawn today and your tests are completely normal, you will receive your results only by: MyChart Message (if you have MyChart) OR A paper copy in the mail If you have any lab test that is abnormal or we need to change your treatment, we will call you to review the results.   Testing/Procedures: Jacqueline [name]- Long Term Monitor Instructions  Your physician has requested you wear a ZIO patch monitor for 14 days.  This is a single patch monitor. [employer] supplies one patch monitor per enrollment. Additional stickers are not available. Please do not apply patch if you will be having a Nuclear Stress Test,  Echocardiogram, Cardiac CT, MRI, or Chest Xray during the period you would be wearing the  monitor. The patch cannot be worn during these tests. You cannot remove and re-apply the  ZIO XT patch monitor.  Your ZIO patch monitor will be mailed 3 day USPS to your address on file. It may take 3-5 days  to receive your monitor after you have been enrolled.  Once you have received your monitor, please review the enclosed instructions. Your monitor  has already been registered assigning a specific monitor serial # to you.  Billing and Patient Assistance Program Information  We have supplied [employer] with any of your insurance information on file for billing purposes. [employer] offers a sliding scale Patient Assistance Program for patients that do not have  insurance, or whose insurance does not completely cover the cost of the ZIO monitor.  You must apply for the Patient Assistance Program to qualify for this discounted rate.  To apply, please call [employer] at [phone], select option 4, select option 2, ask to apply for   Patient Assistance Program. Meredeth Ide will ask your household income, and how many people  are in your household. They will quote your out-of-pocket cost based on that information.  [employer] will also be able to set up a 34-month, interest-free payment plan if needed.  Applying the monitor   Shave hair from upper left chest.  Hold abrader disc by orange tab. Rub abrader in 40 strokes over the upper left chest as  indicated in your monitor instructions.  Clean area with 4 enclosed alcohol pads. Let dry.  Apply patch as indicated in monitor instructions. Patch will be placed under collarbone on left  side of chest with arrow pointing upward.  Rub patch adhesive wings for 2 minutes. Remove white label marked "1". Remove the white  label marked "2". Rub patch adhesive wings for 2 additional minutes.  While looking in a mirror, press and release button in center of patch. A small green light will  flash 3-4 times. This will be your only indicator that the monitor has been turned on.  Do not shower for the first 24 hours. You may shower after the first 24 hours.  Press the button if you feel a symptom. You will hear a small click. Record Date, Time and  Symptom in the Patient Logbook.  When you are ready to remove the patch, follow instructions on the last 2 pages of Patient  Logbook. Stick patch monitor onto the last page of Patient Logbook.  Place Patient Logbook in the blue and white box. Use  locking tab on box and tape box closed  securely. The blue and white box has prepaid postage on it. Please place it in the mailbox as  soon as possible. Your physician should have your test results approximately 7 days after the  monitor has been mailed back to [facility].  Call [facility] Customer Care at [phone] if you have questions regarding  your ZIO XT patch monitor. Call them immediately if you see an orange light blinking on your  monitor.  If your monitor falls off in less than 4  days, contact our Monitor department at [phone].  If your monitor becomes loose or falls off after 4 days call [employer] at [phone] for  suggestions on securing your monitor    Follow-Up: At [facility], you and your health needs are our priority.  As part of our continuing mission to provide you with exceptional heart care, we have created designated Provider Care Teams.  These Care Teams include your primary Cardiologist (physician) and Advanced Practice Providers (APPs -  Physician Assistants and Nurse Practitioners) who all work together to provide you with the care you need, when you need it.  We recommend signing up for the patient portal called "MyChart".  Sign up information is provided on this After Visit Summary.  MyChart is used to connect with patients for Virtual Visits (Telemedicine).  Patients are able to view lab/test results, encounter notes, upcoming appointments, etc.  Non-urgent messages can be sent to your provider as well.   To learn more about what you can do with MyChart, go to [email or web address].    Your next appointment:   6 week(s)  Provider:   You may see Lorine Bears, MD or one of the following Advanced Practice Providers on your designated Care Team:   Nicolasa Ducking, NP Eula Listen, PA-C Cadence Fransico Michael, PA-C Charlsie Quest, NP    Other Instructions Please monitor blood pressure twice daily and fax to our office for the next two weeks. Fax number [phone].

## 2023-04-09 NOTE — Progress Notes (Unsigned)
Cardiology Office Note:   Date:  04/10/2023  ID:  Jacqueline [name], DOB 12-Jul-1946, MRN [id number]  History of Present Illness:   Jacqueline [name] is a 77 y.o. female with a past medical history of coronary disease with previous non-ST elevation MI 2018 treated with drug-eluting stent placement to the right coronary artery, end-stage renal disease on hemodialysis, type 2 diabetes, essential hypertension, hyperlipidemia, paroxysmal atrial fibrillation, peripheral artery disease, and OSA who is here today to follow-up on her coronary artery disease, hypertension and bradycardia during dialysis.  Echocardiogram completed 06/28/2019 which revealed LVEF 60-65%, mild asymmetric left ventricular hypertrophy, moderate mitral annular calcification was present, mild thickening of the aortic valve, mild calcifications of the aortic valve.  She was evaluated in the emergency department [facility] 05/2022 because of her PCP.  She had difficulty getting around for several years but for the past 9 months she continued to decline and was unable to get up in the seated position without assistance.  She has not stage III sacral ulcerations.  She was last seen in clinic 04/26/2022 by Dr. Renato Gails for evaluation of PAD.  She had recently been seen by her podiatrist for bilateral foot pain worse on the left side.  The pain in the left side happened mostly at night and improves with lying still.  Her functional capacity was overall poor she was walking very slowly with a walker.  Positive for claudication symptoms overnight.  She underwent noninvasive vascular studies which showed noncompressible vessels on the right with an ABI of 0.6 on the left.  Duplex showed no significant disease but she was noted to have monophasic waveforms for artery suggest inflow disease.  After discussion of treatment options the patient decided conservative therapy.  She presented back to the [facility] emergency department 09/28/2022 with altered mental status  after an episode of unresponsiveness which was related to hypotension.  Carotid Doppler revealed no significant stenosis, CT of the head and MRI showed no acute pathology, she was evaluated by neurology and cardiology.  Echocardiogram was within normal limits and she remained hypotensive and midodrine was continued Pepcid AC prior to dialysis.  She was discharged to SNF on 10/02/2022.  She presented to the [facility] emergency department 12/22/2022 with hypotension via EMS with associated diaphoresis and shortness of breath.  Patient stated she was only able to tolerate 2 hours of dialysis prior to her symptoms.  Blood pressure was noted at 85/29 with a pulse of 61 respirations of 14.  Patient states she had had similar episodes in the past with intake-follow-up with dialysis.  She was given 300 cc bolus of saline and round.  With labs that were unrevealing and improvement of symptoms blood pressure she was subsequently [facility].  She returns clinic today stating that she has been doing fairly well.  [facility] reside [facility].  Patient states that she has always had issues with blood pressure and low heart rate during dialysis.  Previously was reported that she had blacked out which patient states that she does not remember.  She does continue to have some associated shortness of breath.  Has been maintained off of any medications that would cause lower heart rates.  She is continued on midodrine but she believes the facility has not given her medications as prescribed.  Denies any chest pain , peripheral edema, lightheadedness/dizziness, syncopal or near syncopal episodes.  She has had a few hospitalizations and emergency department visits for related symptoms.   ROS: 10  point review of systems has been reviewed and considered negative with exception of what is been listed in the HPI  Studies Reviewed:    EKG:  sinus with first degree AV block, poor R wave progression (consider WAP)  TTE  09/29/22 1. The left ventricle has normal function, LVEF=60-65%. The left  ventricle has no regional wall motion abnormalities. There is mild  asymmetric left ventricular hypertrophy. Left ventricular diastolic  parameters are indeterminate.   2. Right ventricular systolic function is moderately reduced. The right  ventricular size is severely enlarged. Moderately increased right  ventricular wall thickness. There is mildly elevated pulmonary artery  systolic pressure. The estimated right  ventricular systolic pressure is 38.0 mmHg.   3. Right atrial size was mildly dilated.   4. The aortic valve is tricuspid. There is mild calcification of the  aortic valve. There is mild thickening of the aortic valve. Aortic valve  regurgitation is mild. Aortic valve sclerosis is present, with no evidence  of aortic valve stenosis.   5. The mitral valve is normal in structure. Mild mitral valve  regurgitation. No evidence of mitral stenosis.   6. The inferior vena cava is normal in size with greater than 50%  respiratory variability, suggesting right atrial pressure of 3 mmHg.    Risk Assessment/Calculations:              Physical Exam:   VS:  BP (!) 91/56 (BP Location: Right Arm, Patient Position: Sitting, Cuff Size: Large)   Pulse 87   Ht 5\' 5"  (1.651 m)   Wt 242 lb 2 oz (109.8 kg)   SpO2 96%   BMI 40.29 kg/m    Wt Readings from Last 3 Encounters:  04/09/23 242 lb 2 oz (109.8 kg)  10/01/22 239 lb 10.2 oz (108.7 kg)  05/30/22 236 lb 12.4 oz (107.4 kg)     GEN: Well nourished, well developed in no acute distress NECK: No JVD; No carotid bruits CARDIAC: RRR, no murmurs, rubs, gallops RESPIRATORY:  Clear to auscultation without rales, wheezing or rhonchi  ABDOMEN: Soft, obese, non-tender, non-distended EXTREMITIES:  No edema; No deformity   ASSESSMENT AND PLAN:   Coronary artery disease, native coronary artery without angina.  Last heart catheterization was 12/2016 which an NSTEMI  status post PCI left main was normal, LAD was 20% stenosis, D1 20% stenosis, OM2 20% stenosis, OM 320% stenosis, RCA 80% with DES placed in an LVEF of 65%.  She denies any current chest pain.  She is continued on apixaban 5 mg twice daily and lieu of aspirin, Zetia 10 mg daily and rosuvastatin 10 mg daily.  Hypotension with dialysis.  She is continued on midodrine 20 mg daily with noted additional 20 mg on dialysis days.  She also has an additional 20 mg to take by mouth as needed for systolic blood pressure less than 100.  We have requested that we can fax her blood pressures to clinic for the next 2 weeks.  Near syncopal versus syncopal episode with bradycardia during dialysis requiring emergency department visit.  Patient states that she did not blackout but when she is on dialysis she does have several episodes of feeling lightheaded and feels as though they are removing too much fluid.  She has been placed on a ZIO XT monitor to rule out arrhythmia.  There is question on her EKG today of possible WHNP but R wave progression is poor and is difficult to determine.  End-stage renal disease on hemodialysis Tuesday Thursday  and Saturday.  This is managed by nephrology.  Mixed hyperlipidemia with a last ill 47 09/2022.  He is continued on rosuvastatin 10 mg daily and ezetimibe 10 mg daily.  She remains at goal she has been continued on current medication regimen.  Paroxysmal atrial fibrillation that was found in 09/2022 during hospitalization.  She was placed on apixaban 5 mg twice daily for CHA2DS2-VASc score of at least 6 that she does not currently qualify for reduced dose today.  Due to her slow ventricular response continue to avoid AV nodal blocking agents.  Electrolytes continue to be monitored with dialysis-stable on previous blood work on 12/26/22.  She has been placed on a ZIO XT monitor.  To evaluate for arrhythmia.  Peripheral arterial disease with patient continues to have rest pain to the  left side.  She continues to be without any lower extremity ulceration.  She is limited in her activity functional capacity is poor after discussing the results of abnormal ABI she continues to prefer conservative therapy hold for endovascular intervention at this time.  Disposition patient return to clinic to see MD/APP in 6 weeks or sooner if needed.        Signed, Elaynah Virginia, NP

## 2023-04-10 ENCOUNTER — Encounter: Payer: Self-pay | Admitting: Cardiology

## 2023-04-16 DIAGNOSIS — R55 Syncope and collapse: Secondary | ICD-10-CM

## 2023-04-16 DIAGNOSIS — R001 Bradycardia, unspecified: Secondary | ICD-10-CM | POA: Diagnosis not present

## 2023-05-12 ENCOUNTER — Telehealth: Payer: Self-pay | Admitting: Cardiology

## 2023-05-12 NOTE — Telephone Encounter (Signed)
Email notification received from [employer] stating the patient's monitor had not yet been returned:  [employer] Delivery Date: 04/09/23 Serial #: [id number]  Monitor Status: In Transit to [employer]  ETA: 05/08/2023 Track Package

## 2023-05-12 NOTE — Telephone Encounter (Signed)
Left voicemail message to call back  

## 2023-05-13 ENCOUNTER — Telehealth: Payer: Self-pay | Admitting: Cardiovascular Disease

## 2023-05-13 NOTE — Telephone Encounter (Addendum)
   Cardiac Monitor Alert  Date of alert:  05/13/2023   Patient Name: Jacqueline [name]  DOB: 1946/04/02  MRN: [id number]   [facility] Cardiologist: Lorine Bears, MD  [facility] EP:  None    Monitor Information: Long Term Monitor [ZioXT]  Reason:  Syncope Ordering provider:  Charlsie Quest   Alert Slow Atrial Fibrillation/Flutter HR 36 bmp for 60 sec on 05/01/23 @ 7:10 am This is the 1st alert for this rhythm.  The patient has a hx of Atrial Fibrillation/Flutter.  The patient is not currently on anticoagulation.  Anticoagulation medication as of 05/13/2023           apixaban (ELIQUIS) 5 MG TABS tablet Take 1 tablet (5 mg total) by mouth 2 (two) times daily.       Next Cardiology Appointment   Date:  06/27/23  Provider:  Kirke Corin  The patient could NOT be reached by telephone today.  Left message to call back Arrhythmia, symptoms and history reviewed with Dr. Mariah Milling .  Plan:  Per MD, recommends contacting pt to make sure she didn't have any symptoms while wearing monitor. If so, move up appointment date. If not, advise pt to keep scheduled appointment and to sit on the side of the bed a few minutes before getting up and change position slowly.       Parke Poisson, RN  05/13/2023 11:29 AM        36 bmp for 60 sec 5/30 7:10 am

## 2023-05-13 NOTE — Telephone Encounter (Signed)
[employer] calling with abnormal zio results. Call transferred.

## 2023-05-19 NOTE — Progress Notes (Signed)
Increased atrial fibrillation burden noted. No pauses. Bradycardia noted during the early morning. Not usually during dialysis. No pauses or arrhythmia to explain syncope/near-syncope.

## 2023-05-19 NOTE — Telephone Encounter (Signed)
See other telephone encounter. Monitor sent back and has been resulted.

## 2023-05-28 ENCOUNTER — Telehealth: Payer: Self-pay | Admitting: *Deleted

## 2023-05-28 NOTE — Telephone Encounter (Signed)
-----   Message from Charlsie Quest, NP sent at 05/19/2023  4:27 PM EDT ----- Increased atrial fibrillation burden noted. No pauses. Bradycardia noted during the early morning. Not usually during dialysis. No pauses or arrhythmia to explain syncope/near-syncope.

## 2023-05-28 NOTE — Telephone Encounter (Signed)
Left voicemail message to call back  

## 2023-05-29 ENCOUNTER — Encounter: Payer: Self-pay | Admitting: *Deleted

## 2023-05-29 NOTE — Telephone Encounter (Signed)
Letter has been mailed to patient.       Result Care Coordination   Result Notes   Bryna Colander, RN 05/29/2023  9:40 AM EDT Back to Top    No answer/Left voicemail message. This is attempt #3 Mailing letter with results.   Bryna Colander, RN 05/28/2023  9:27 AM EDT     Left voicemail message to call back.   Bryna Colander, RN 05/22/2023  4:54 PM EDT     Left voicemail message to call back for results.   Charlsie Quest, NP 05/19/2023  4:27 PM EDT     Increased atrial fibrillation burden noted. No pauses. Bradycardia noted during the early morning. Not usually during dialysis. No pauses or arrhythmia to explain syncope/near-syncope.     Patient Communication   Edit Comments   Add Notifications  Back to Top    Increased atrial fibrillation burden noted. No pauses. Bradycardia noted during the early morning. Not usually during dialysis. No pauses or arrhythmia to explain syncope/near-syncope.  Written by Bryna Colander, RN on 05/20/2023  1:42 PM EDT

## 2023-05-29 NOTE — Telephone Encounter (Signed)
Pt son returning call

## 2023-05-30 NOTE — Telephone Encounter (Signed)
Left voicemail message to call back  

## 2023-06-07 ENCOUNTER — Other Ambulatory Visit: Payer: Self-pay

## 2023-06-07 ENCOUNTER — Encounter (HOSPITAL_COMMUNITY): Payer: Self-pay

## 2023-06-07 ENCOUNTER — Inpatient Hospital Stay
Admission: EM | Admit: 2023-06-07 | Discharge: 2023-07-01 | DRG: 091 | Disposition: A | Discharge status: Other Acute Inpatient Hospital | Payer: Medicare Other | Source: Ambulatory Visit | Attending: Internal Medicine | Admitting: Internal Medicine

## 2023-06-07 ENCOUNTER — Emergency Department: Payer: Medicare Other

## 2023-06-07 DIAGNOSIS — E1122 Type 2 diabetes mellitus with diabetic chronic kidney disease: Secondary | ICD-10-CM | POA: Diagnosis present

## 2023-06-07 DIAGNOSIS — N186 End stage renal disease: Secondary | ICD-10-CM | POA: Diagnosis present

## 2023-06-07 DIAGNOSIS — Z7189 Other specified counseling: Secondary | ICD-10-CM

## 2023-06-07 DIAGNOSIS — Z95828 Presence of other vascular implants and grafts: Secondary | ICD-10-CM | POA: Diagnosis not present

## 2023-06-07 DIAGNOSIS — I5032 Chronic diastolic (congestive) heart failure: Secondary | ICD-10-CM | POA: Diagnosis present

## 2023-06-07 DIAGNOSIS — Z6841 Body Mass Index (BMI) 40.0 and over, adult: Secondary | ICD-10-CM | POA: Diagnosis not present

## 2023-06-07 DIAGNOSIS — I4821 Permanent atrial fibrillation: Secondary | ICD-10-CM | POA: Diagnosis present

## 2023-06-07 DIAGNOSIS — R001 Bradycardia, unspecified: Secondary | ICD-10-CM | POA: Diagnosis not present

## 2023-06-07 DIAGNOSIS — G8929 Other chronic pain: Secondary | ICD-10-CM | POA: Diagnosis present

## 2023-06-07 DIAGNOSIS — D631 Anemia in chronic kidney disease: Secondary | ICD-10-CM | POA: Diagnosis present

## 2023-06-07 DIAGNOSIS — E1151 Type 2 diabetes mellitus with diabetic peripheral angiopathy without gangrene: Secondary | ICD-10-CM | POA: Diagnosis present

## 2023-06-07 DIAGNOSIS — I252 Old myocardial infarction: Secondary | ICD-10-CM

## 2023-06-07 DIAGNOSIS — Z8701 Personal history of pneumonia (recurrent): Secondary | ICD-10-CM

## 2023-06-07 DIAGNOSIS — E876 Hypokalemia: Secondary | ICD-10-CM | POA: Diagnosis present

## 2023-06-07 DIAGNOSIS — Z5986 Financial insecurity: Secondary | ICD-10-CM

## 2023-06-07 DIAGNOSIS — Z803 Family history of malignant neoplasm of breast: Secondary | ICD-10-CM

## 2023-06-07 DIAGNOSIS — E785 Hyperlipidemia, unspecified: Secondary | ICD-10-CM | POA: Diagnosis not present

## 2023-06-07 DIAGNOSIS — Z8249 Family history of ischemic heart disease and other diseases of the circulatory system: Secondary | ICD-10-CM

## 2023-06-07 DIAGNOSIS — I272 Pulmonary hypertension, unspecified: Secondary | ICD-10-CM | POA: Diagnosis not present

## 2023-06-07 DIAGNOSIS — Z833 Family history of diabetes mellitus: Secondary | ICD-10-CM

## 2023-06-07 DIAGNOSIS — I2729 Other secondary pulmonary hypertension: Secondary | ICD-10-CM | POA: Diagnosis present

## 2023-06-07 DIAGNOSIS — Z87891 Personal history of nicotine dependence: Secondary | ICD-10-CM

## 2023-06-07 DIAGNOSIS — K219 Gastro-esophageal reflux disease without esophagitis: Secondary | ICD-10-CM | POA: Diagnosis present

## 2023-06-07 DIAGNOSIS — M1712 Unilateral primary osteoarthritis, left knee: Secondary | ICD-10-CM | POA: Diagnosis present

## 2023-06-07 DIAGNOSIS — Z882 Allergy status to sulfonamides status: Secondary | ICD-10-CM

## 2023-06-07 DIAGNOSIS — I132 Hypertensive heart and chronic kidney disease with heart failure and with stage 5 chronic kidney disease, or end stage renal disease: Secondary | ICD-10-CM | POA: Diagnosis present

## 2023-06-07 DIAGNOSIS — I70208 Unspecified atherosclerosis of native arteries of extremities, other extremity: Secondary | ICD-10-CM | POA: Diagnosis not present

## 2023-06-07 DIAGNOSIS — R531 Weakness: Secondary | ICD-10-CM | POA: Diagnosis present

## 2023-06-07 DIAGNOSIS — I5031 Acute diastolic (congestive) heart failure: Secondary | ICD-10-CM | POA: Diagnosis not present

## 2023-06-07 DIAGNOSIS — Z992 Dependence on renal dialysis: Secondary | ICD-10-CM | POA: Diagnosis not present

## 2023-06-07 DIAGNOSIS — R579 Shock, unspecified: Secondary | ICD-10-CM | POA: Diagnosis not present

## 2023-06-07 DIAGNOSIS — I9589 Other hypotension: Secondary | ICD-10-CM | POA: Diagnosis present

## 2023-06-07 DIAGNOSIS — M545 Low back pain, unspecified: Secondary | ICD-10-CM | POA: Diagnosis present

## 2023-06-07 DIAGNOSIS — I4891 Unspecified atrial fibrillation: Secondary | ICD-10-CM | POA: Diagnosis not present

## 2023-06-07 DIAGNOSIS — N2581 Secondary hyperparathyroidism of renal origin: Secondary | ICD-10-CM | POA: Diagnosis present

## 2023-06-07 DIAGNOSIS — I071 Rheumatic tricuspid insufficiency: Secondary | ICD-10-CM | POA: Diagnosis present

## 2023-06-07 DIAGNOSIS — Z7401 Bed confinement status: Secondary | ICD-10-CM

## 2023-06-07 DIAGNOSIS — I2510 Atherosclerotic heart disease of native coronary artery without angina pectoris: Secondary | ICD-10-CM | POA: Diagnosis present

## 2023-06-07 DIAGNOSIS — I953 Hypotension of hemodialysis: Secondary | ICD-10-CM | POA: Diagnosis present

## 2023-06-07 DIAGNOSIS — E860 Dehydration: Secondary | ICD-10-CM | POA: Diagnosis present

## 2023-06-07 DIAGNOSIS — E872 Acidosis, unspecified: Secondary | ICD-10-CM | POA: Diagnosis present

## 2023-06-07 DIAGNOSIS — R079 Chest pain, unspecified: Secondary | ICD-10-CM | POA: Diagnosis not present

## 2023-06-07 DIAGNOSIS — E871 Hypo-osmolality and hyponatremia: Secondary | ICD-10-CM | POA: Diagnosis present

## 2023-06-07 DIAGNOSIS — Z950 Presence of cardiac pacemaker: Secondary | ICD-10-CM

## 2023-06-07 DIAGNOSIS — Z7901 Long term (current) use of anticoagulants: Secondary | ICD-10-CM

## 2023-06-07 DIAGNOSIS — Z79899 Other long term (current) drug therapy: Secondary | ICD-10-CM

## 2023-06-07 DIAGNOSIS — D696 Thrombocytopenia, unspecified: Secondary | ICD-10-CM | POA: Diagnosis present

## 2023-06-07 DIAGNOSIS — I959 Hypotension, unspecified: Secondary | ICD-10-CM | POA: Diagnosis present

## 2023-06-07 DIAGNOSIS — G901 Familial dysautonomia [Riley-Day]: Secondary | ICD-10-CM | POA: Diagnosis present

## 2023-06-07 DIAGNOSIS — R570 Cardiogenic shock: Secondary | ICD-10-CM | POA: Diagnosis present

## 2023-06-07 DIAGNOSIS — Z90710 Acquired absence of both cervix and uterus: Secondary | ICD-10-CM

## 2023-06-07 DIAGNOSIS — D72829 Elevated white blood cell count, unspecified: Secondary | ICD-10-CM | POA: Diagnosis not present

## 2023-06-07 DIAGNOSIS — G4733 Obstructive sleep apnea (adult) (pediatric): Secondary | ICD-10-CM | POA: Diagnosis present

## 2023-06-07 DIAGNOSIS — I4819 Other persistent atrial fibrillation: Secondary | ICD-10-CM | POA: Diagnosis not present

## 2023-06-07 DIAGNOSIS — Z9981 Dependence on supplemental oxygen: Secondary | ICD-10-CM

## 2023-06-07 DIAGNOSIS — Z955 Presence of coronary angioplasty implant and graft: Secondary | ICD-10-CM

## 2023-06-07 HISTORY — DX: Personal history of other medical treatment: Z92.89

## 2023-06-07 HISTORY — DX: Other persistent atrial fibrillation: I48.19

## 2023-06-07 HISTORY — DX: Bradycardia, unspecified: R00.1

## 2023-06-07 LAB — BASIC METABOLIC PANEL
Anion gap: 14 (ref 5–15)
BUN: 55 mg/dL — ABNORMAL HIGH (ref 8–23)
CO2: 27 mmol/L (ref 22–32)
Calcium: 7.7 mg/dL — ABNORMAL LOW (ref 8.9–10.3)
Chloride: 92 mmol/L — ABNORMAL LOW (ref 98–111)
Creatinine, Ser: 7.76 mg/dL — ABNORMAL HIGH (ref 0.44–1.00)
GFR, Estimated: 5 mL/min — ABNORMAL LOW (ref >60)
Glucose, Bld: 130 mg/dL — ABNORMAL HIGH (ref 70–99)
Potassium: 3.4 mmol/L — ABNORMAL LOW (ref 3.5–5.1)
Sodium: 133 mmol/L — ABNORMAL LOW (ref 135–145)

## 2023-06-07 LAB — CBC
HCT: 41 % (ref 36.0–46.0)
Hemoglobin: 12.4 g/dL (ref 12.0–15.0)
MCH: 30.8 pg (ref 26.0–34.0)
MCHC: 30.2 g/dL (ref 30.0–36.0)
MCV: 102 fL — ABNORMAL HIGH (ref 80.0–100.0)
Platelets: 146 10*3/uL — ABNORMAL LOW (ref 150–400)
RBC: 4.02 MIL/uL (ref 3.87–5.11)
RDW: 15.7 % — ABNORMAL HIGH (ref 11.5–15.5)
WBC: 11.5 10*3/uL — ABNORMAL HIGH (ref 4.0–10.5)
nRBC: 0 % (ref 0.0–0.2)

## 2023-06-07 LAB — TSH
TSH: 1.491 u[IU]/mL (ref 0.350–4.500)
TSH: 1.497 u[IU]/mL (ref 0.350–4.500)

## 2023-06-07 LAB — CBG MONITORING, ED: Glucose-Capillary: 104 mg/dL — ABNORMAL HIGH (ref 70–99)

## 2023-06-07 LAB — T4, FREE: Free T4: 0.95 ng/dL (ref 0.61–1.12)

## 2023-06-07 LAB — LACTIC ACID, PLASMA: Lactic Acid, Venous: 1.3 mmol/L (ref 0.5–1.9)

## 2023-06-07 LAB — LIPASE, BLOOD: Lipase: 51 U/L (ref 11–51)

## 2023-06-07 LAB — MRSA NEXT GEN BY PCR, NASAL: MRSA by PCR Next Gen: NOT DETECTED

## 2023-06-07 MED ORDER — SODIUM CHLORIDE 0.9 % IV BOLUS
500.0000 mL | Freq: Once | INTRAVENOUS | Status: AC
  Administered 2023-06-07: 500 mL via INTRAVENOUS

## 2023-06-07 MED ORDER — LIDOCAINE HCL 1 % IJ SOLN
INTRAMUSCULAR | Status: AC
  Administered 2023-06-08: 10 mL
  Filled 2023-06-07: qty 10

## 2023-06-07 MED ORDER — SODIUM CHLORIDE 0.9 % IV SOLN
2.0000 g | Freq: Once | INTRAVENOUS | Status: AC
  Administered 2023-06-07: 2 g via INTRAVENOUS
  Filled 2023-06-07: qty 12.5

## 2023-06-07 MED ORDER — DOPAMINE-DEXTROSE 3.2-5 MG/ML-% IV SOLN
0.0000 ug/kg/min | INTRAVENOUS | Status: DC
  Administered 2023-06-07: 2 ug/kg/min via INTRAVENOUS
  Filled 2023-06-07: qty 250

## 2023-06-07 MED ORDER — IOHEXOL 300 MG/ML  SOLN
100.0000 mL | Freq: Once | INTRAMUSCULAR | Status: AC | PRN
  Administered 2023-06-07: 100 mL via INTRAVENOUS

## 2023-06-07 MED ORDER — INSULIN ASPART 100 UNIT/ML IJ SOLN
0.0000 [IU] | INTRAMUSCULAR | Status: DC
  Administered 2023-06-08: 1 [IU] via SUBCUTANEOUS
  Administered 2023-06-08 – 2023-06-09 (×2): 2 [IU] via SUBCUTANEOUS
  Administered 2023-06-09 – 2023-06-12 (×7): 1 [IU] via SUBCUTANEOUS
  Administered 2023-06-12: 2 [IU] via SUBCUTANEOUS
  Filled 2023-06-07 (×12): qty 1

## 2023-06-07 MED ORDER — METRONIDAZOLE 500 MG/100ML IV SOLN
500.0000 mg | Freq: Once | INTRAVENOUS | Status: AC
  Administered 2023-06-07: 500 mg via INTRAVENOUS
  Filled 2023-06-07: qty 100

## 2023-06-07 MED ORDER — DOCUSATE SODIUM 100 MG PO CAPS
100.0000 mg | ORAL_CAPSULE | Freq: Two times a day (BID) | ORAL | Status: DC | PRN
  Administered 2023-06-12 – 2023-06-17 (×4): 100 mg via ORAL
  Filled 2023-06-07 (×4): qty 1

## 2023-06-07 MED ORDER — VANCOMYCIN HCL IN DEXTROSE 1-5 GM/200ML-% IV SOLN
1000.0000 mg | Freq: Once | INTRAVENOUS | Status: AC
  Administered 2023-06-07: 1000 mg via INTRAVENOUS
  Filled 2023-06-07: qty 200

## 2023-06-07 MED ORDER — MIDODRINE HCL 5 MG PO TABS
10.0000 mg | ORAL_TABLET | Freq: Once | ORAL | Status: AC
  Administered 2023-06-07: 10 mg via ORAL
  Filled 2023-06-07: qty 2

## 2023-06-07 MED ORDER — HEPARIN SODIUM (PORCINE) 5000 UNIT/ML IJ SOLN
5000.0000 [IU] | Freq: Three times a day (TID) | INTRAMUSCULAR | Status: DC
  Administered 2023-06-07: 5000 [IU] via SUBCUTANEOUS
  Filled 2023-06-07: qty 1

## 2023-06-07 MED ORDER — POLYETHYLENE GLYCOL 3350 17 G PO PACK
17.0000 g | PACK | Freq: Every day | ORAL | Status: DC | PRN
  Administered 2023-06-12 – 2023-06-23 (×3): 17 g via ORAL
  Filled 2023-06-07 (×5): qty 1

## 2023-06-07 MED ORDER — MIDODRINE HCL 5 MG PO TABS
10.0000 mg | ORAL_TABLET | Freq: Three times a day (TID) | ORAL | Status: DC
  Administered 2023-06-07: 10 mg via ORAL
  Filled 2023-06-07: qty 2

## 2023-06-07 MED ORDER — DOPAMINE-DEXTROSE 3.2-5 MG/ML-% IV SOLN
INTRAVENOUS | Status: AC
  Filled 2023-06-07: qty 250

## 2023-06-07 MED ORDER — ATROPINE SULFATE 0.4 MG/ML IV SOLN
0.4000 mg | Freq: Once | INTRAVENOUS | Status: AC
  Administered 2023-06-07: 0.4 mg via INTRAVENOUS
  Filled 2023-06-07: qty 1

## 2023-06-07 NOTE — H&P (Signed)
NAME:  Jacqueline [name], MRN:  [id number], DOB:  08/27/1946, LOS: 1 ADMISSION DATE:  06/07/2023, CONSULTATION DATE:  06/07/23 REFERRING MD: Willy Eddy CHIEF COMPLAINT:  Generalized weakness   HPI  77 y.o female with significant PMH of HTN, T2DM,ESRD on HD TTS, Chronic debility , CAD s/p stent, PAF,  OSA, PAD, Left knee arthritis, Chronic lower back pain,GERD,Obesity , HLD  who presented to the ED  from Dialysis with chief complaints of generalized weakness x 4 days.  EMS report: NVD and LLQ abdominal pain since last night. States tried to eat yesterday, each time she ate she vomited, then had diarrhea last night. No NVD today. She went to Dialysis today and was noted with low BP of 72/42. She usually runs lows BP and takes Midodrine but today her BP was lower than usually so she was not dialyzed but was sent to ED for further evaluation.   ED Course:  Initial vital signs showed HR of 50 beats/minute, BP 81/51 mm Hg, the RR 16 breaths/minute, and the oxygen saturation 91% on 2L and a temperature of 97.62F (36.6C).   Pertinent Labs/Diagnostics.  Na+/ K+: 133/3.4 Glucose:130 BUN/Cr.:55/7.76 WBC:11.5 Plts: 146 PCT: pending Lactic acid: 1.3 EKG:?Afib vs junctional bradycardia rate 40bmp. Incomplete RBB,  CXR, CT scan obtained and show no signs of infection or other abnormality. Patient given, midodrine, atropine for significant bradycardia and  500 cc of fluids and started on broad-spectrum antibiotics Vanco cefepime and Flagyl for  suspected sepsis of unknown source. Patient remained hypotensive despite  intervention as above. PCCM consulted for possible pressors.  Past Medical History    Arthritis      "left knee" (12/04/2016)   CAD (coronary artery disease)      a. 12/2016 NSTEMI/PCI: LM nl, LaD 20p/d, D1 20, OM2 20, OM3 20, RCA 80m (3.0x18 Resolute DES). EF 65%.   Chronic lower back pain     ESRD on dialysis [facility])      "[facility]; [address]; TTS" (12/04/2016)   GERD  (gastroesophageal reflux disease)     Hyperlipidemia     Hypertension      a. 12/2016 labetalol d/c'd 2/2 hypotension in HD.   Morbid obesity (HCC)     Obesity     Pneumonia      "couple times" (12/04/2016)   Type II diabetes mellitus (HCC)    Significant Hospital Events   06/07/23: Admit to ICU with symptomatic bradycardia  Consults:  Cardiology Nephrology  Procedures:  None  Significant Diagnostic Tests:  06/07/23: Chest Xray> IMPRESSION: No acute chest findings.  06/07/23: CTA abdomen and pelvis> IMPRESSION: 1. Stable exam. No acute findings in the abdomen or pelvis. Specifically, no findings to explain the patient's history of abdominal pain. 2. Bilateral renal atrophy with multiple simple cysts again noted. No followup imaging is recommended. 3.  Aortic Atherosclerosis (ICD10-I70.0).  Interim History / Subjective:  -  Micro Data:  7/6: Blood culture x2> 7/6: Urine Culture> 7/6: MRSA PCR>>   Antimicrobials:  Vancomycin 7/6 x 1 Cefepime 7/6 x 1 Metronidazole 7/6 x 1  OBJECTIVE  Blood pressure (!) 88/44, pulse 63, temperature 97.7 F (36.5 C), temperature source Oral, resp. rate 20, height 5\' 4"  (1.626 m), weight 115.6 kg, SpO2 (!) 88 %.       Intake/Output Summary (Last 24 hours) at 06/08/2023 0233 Last data filed at 06/08/2023 0100 Gross per 24 hour  Intake 910.78 ml  Output --  Net 910.78 ml   [employer]  06/07/23 1218 06/07/23 2300  Weight: 114.8 kg 115.6 kg   Physical Examination  GENERAL: 77 year-old critically ill patient lying in the bed in no acute distress EYES: PEERLA. No scleral icterus. Extraocular muscles intact.  HEENT: Head atraumatic, normocephalic. Oropharynx and nasopharynx clear.  NECK:  No JVD, supple  LUNGS: Normal breath sounds bilaterally.  No use of accessory muscles of respiration.  CARDIOVASCULAR: S1, S2 normal. No murmurs, rubs, or gallops.  ABDOMEN: Soft, NTND EXTREMITIES: No swelling or erythema.  Right arm dialysis fistula  Capillary refill is less than 3 seconds in all extremities. Pulses palpable distally. NEUROLOGIC: The patient is alert and oriented x 4. No focal neurological deficit. Cranial nerves are intact.  SKIN: No obvious rash, lesion, or ulcer. Warm to touch Labs/imaging that I havepersonally reviewed  (right click and "Reselect all SmartList Selections" daily)     Labs   CBC: Recent Labs  Lab 06/07/23 1254  WBC 11.5*  HGB 12.4  HCT 41.0  MCV 102.0*  PLT 146*    Basic Metabolic Panel: Recent Labs  Lab 06/07/23 1254  NA 133*  K 3.4*  CL 92*  CO2 27  GLUCOSE 130*  BUN 55*  CREATININE 7.76*  CALCIUM 7.7*   GFR: Estimated Creatinine Clearance: 7.3 mL/min (A) (by C-G formula based on SCr of 7.76 mg/dL (H)). Recent Labs  Lab 06/07/23 1254  WBC 11.5*  LATICACIDVEN 1.3    Liver Function Tests: No results for input(s): "AST", "ALT", "ALKPHOS", "BILITOT", "PROT", "ALBUMIN" in the last 168 hours. Recent Labs  Lab 06/07/23 1254  LIPASE 51   No results for input(s): "AMMONIA" in the last 168 hours.  ABG    Component Value Date/Time   PHART 7.43 05/21/2022 2300   PCO2ART 42 05/21/2022 2300   PO2ART 66 (L) 05/21/2022 2300   HCO3 27.9 05/21/2022 2300   TCO2 31 01/19/2014 1614   ACIDBASEDEF 1.0 01/19/2014 1238   O2SAT 95.2 05/21/2022 2300     Coagulation Profile: No results for input(s): "INR", "PROTIME" in the last 168 hours.  Cardiac Enzymes: No results for input(s): "CKTOTAL", "CKMB", "CKMBINDEX", "TROPONINI" in the last 168 hours.  HbA1C: Hemoglobin A1C  Date/Time Value Ref Range Status  06/12/2021 12:00 AM 6.6  Final   Hgb A1c MFr Bld  Date/Time Value Ref Range Status  06/07/2023 07:44 PM 5.8 (H) 4.8 - 5.6 % Final    Comment:    (NOTE) Pre diabetes:          5.7%-6.4%  Diabetes:              >6.4%  Glycemic control for   <7.0% adults with diabetes   09/29/2022 04:55 PM 5.2 4.8 - 5.6 % Final    Comment:    (NOTE) Pre diabetes:           5.7%-6.4%  Diabetes:              >6.4%  Glycemic control for   <7.0% adults with diabetes     CBG: Recent Labs  Lab 06/07/23 1947 06/08/23 0026  GLUCAP 104* 115*    Review of Systems:   Review of Systems  Constitutional:  Negative for chills, diaphoresis, fever, malaise/fatigue and weight loss.  HENT:  Negative for congestion, ear discharge, hearing loss, nosebleeds, sinus pain, sore throat and tinnitus.   Eyes: Negative.   Respiratory:  Negative for cough, hemoptysis, sputum production, shortness of breath, wheezing and stridor.   Cardiovascular:  Positive for claudication and leg swelling. Negative  for chest pain, palpitations, orthopnea and PND.  Gastrointestinal:  Positive for abdominal pain, diarrhea, nausea and vomiting. Negative for blood in stool, constipation, heartburn and melena.  Genitourinary: Negative.   Musculoskeletal:  Positive for falls and joint pain. Negative for back pain, myalgias and neck pain.  Skin:  Negative for itching and rash.  Neurological:  Positive for dizziness.  Endo/Heme/Allergies: Negative.   Psychiatric/Behavioral: Negative.  Negative for memory loss.    Past Medical History  She,  has a past medical history of Arthritis, CAD (coronary artery disease), Chronic lower back pain, Diverticulosis, ESRD on dialysis (HCC), GERD (gastroesophageal reflux disease), Hiatal hernia, Hyperlipidemia, Hypertension, Morbid obesity (HCC), Obesity, Pneumonia, Renal insufficiency, and Type II diabetes mellitus (HCC).   Surgical History    Past Surgical History:  Procedure Laterality Date   ABDOMINAL HYSTERECTOMY     AV FISTULA PLACEMENT Left 01/27/2014   Procedure: ARTERIOVENOUS (AV) FISTULA CREATION;  Surgeon: Pryor Ochoa, MD;  Location: [facility] OR;  Service: Vascular;  Laterality: Left;   AV FISTULA REPAIR     "had it cleaned out"   CARDIAC CATHETERIZATION  ?1980s   CARDIAC CATHETERIZATION N/A 12/04/2016   Procedure: Left Heart Cath Angiography;  Surgeon:  Kathleene Hazel, MD;  Location: [facility] INVASIVE CV LAB;  Service: Cardiovascular;  Laterality: N/A;   CARDIAC CATHETERIZATION N/A 12/04/2016   Procedure: Coronary Stent Intervention;  Surgeon: Kathleene Hazel, MD;  Location: [facility] INVASIVE CV LAB;  Service: Cardiovascular;  Laterality: N/A;  Mid RCA   CATARACT EXTRACTION W/ INTRAOCULAR LENS IMPLANT Left    COLONOSCOPY W/ BIOPSIES AND POLYPECTOMY     CORONARY ANGIOPLASTY WITH STENT PLACEMENT  12/04/2016   ESOPHAGOGASTRODUODENOSCOPY     INSERTION OF DIALYSIS CATHETER N/A 01/19/2014   Procedure: INSERTION OF DIALYSIS CATHETER;  Surgeon: Larina Earthly, MD;  Location: [facility] OR;  Service: Vascular;  Laterality: N/A;    Social History   reports that she quit smoking about 39 years ago. Her smoking use included cigarettes. She has a 20.00 pack-year smoking history. She has never used smokeless tobacco. She reports that she does not drink alcohol and does not use drugs.   Family History   Her family history includes Breast cancer in her sister; CAD in her father and sister; Cancer in her brother; Diabetes in her mother; Heart attack in her father; Heart disease in her father; Hypertension in her father; Peripheral vascular disease in her mother.   Allergies Allergies  Allergen Reactions   Other Other (See Comments)   Sulfa Antibiotics Other (See Comments)   Sulfa Drugs Cross Reactors Other (See Comments)    Doesn't remember      Home Medications  Prior to Admission medications   Medication Sig Start Date End Date Taking? Authorizing Provider  acetaminophen (TYLENOL) 650 MG CR tablet Take 1,300 mg by mouth every 8 (eight) hours as needed for pain.    [provider]  albuterol (VENTOLIN HFA) 108 (90 Base) MCG/ACT inhaler Inhale 1-2 puffs into the lungs every 6 (six) hours as needed for wheezing or shortness of breath. 12/15/21   Franne Forts, DO  apixaban (ELIQUIS) 5 MG TABS tablet Take 1 tablet (5 mg total) by mouth 2 (two) times  daily. 10/02/22   Tresa Moore, MD  B Complex-C-Zn-Folic Acid (DIALYVITE/ZINC) TABS Take 1 tablet by mouth daily.    [provider]  bisacodyl (DULCOLAX) 5 MG EC tablet Take 2 tablets (10 mg total) by mouth daily as needed for moderate  constipation. 10/02/22   Tresa Moore, MD  cinacalcet (SENSIPAR) 30 MG tablet Take 30 mg by mouth daily with supper.    [provider]  ezetimibe (ZETIA) 10 MG tablet Take 1 tablet (10 mg total) by mouth daily. 04/30/22   Iran Ouch, MD  ferric citrate (AURYXIA) 1 GM 210 MG(Fe) tablet Take 420 mg by mouth 3 (three) times daily with meals.    [provider]  fexofenadine (ALLEGRA) 180 MG tablet Take 180 mg by mouth daily.    [provider]  midodrine (PROAMATINE) 10 MG tablet Take 20 mg by mouth daily. (Take additional two tablets [20mg ] for administration at dialysis on Tuesday, Thursday and Saturday)    [provider]  midodrine (PROAMATINE) 10 MG tablet Take 20 mg by mouth daily as needed (for SBP <100).    [provider]  Omega-3 Fatty Acids (FISH OIL) 1000 MG CPDR Take 1,000 mg by mouth daily.    [provider]  omeprazole (PRILOSEC) 20 MG capsule Take 1 capsule (20 mg total) by mouth daily. Patient taking differently: Take 20 mg by mouth 2 (two) times daily. 02/21/22   Arnette Felts, FNP  ondansetron (ZOFRAN-ODT) 4 MG disintegrating tablet Take 4 mg by mouth every 8 (eight) hours as needed for nausea or vomiting.    [provider]  polyethylene glycol (MIRALAX / GLYCOLAX) 17 g packet Take 17 g by mouth at bedtime.    [provider]  pregabalin (LYRICA) 100 MG capsule Take 100 mg by mouth at bedtime.    [provider]  pregabalin (LYRICA) 50 MG capsule Take 50 mg by mouth 2 (two) times daily.    [provider]  rosuvastatin (CRESTOR) 10 MG tablet Take 1 tablet (10 mg total) by mouth daily. 10/03/22   Tresa Moore, MD   sennosides-docusate sodium (SENOKOT-S) 8.6-50 MG tablet Take 1 tablet by mouth 2 (two) times daily.    [provider]  trolamine salicylate (ASPERCREME) 10 % cream Apply 1 Application topically 2 (two) times daily. (Apply to knees)    [provider]    Scheduled Meds:  apixaban  5 mg Oral BID   Chlorhexidine Gluconate Cloth  6 each Topical Daily   cinacalcet  30 mg Oral Q supper   ezetimibe  10 mg Oral Daily   ferric citrate  420 mg Oral TID WC   insulin aspart  0-6 Units Subcutaneous Q4H   loratadine  10 mg Oral Daily   pregabalin  100 mg Oral QHS   pregabalin  50 mg Oral BID   rosuvastatin  10 mg Oral Daily   senna-docusate  1 tablet Oral BID   Continuous Infusions:  DOPamine 3 mcg/kg/min (06/08/23 0100)   PRN Meds:.bisacodyl, docusate sodium, ondansetron (ZOFRAN) IV, polyethylene glycol   Active Hospital Problem list   See below  Assessment & Plan:  #Symptomatic  Bradycardia Presenting with HR in the low 30's, hypotension and near syncopal episode, evaluated by cards on 04/09/23 for similar presentation and was placed on ZIO XT monitor -tsh:, free t4: -Monitor BMP+Mag -Hold AV nodal drugs -Start Dopamine 5-20 mcg/kg/min  -Cardiology consult, evaluate for pacemaker. Discussed with Dr. Mariah Milling who recommend observation as long as HR >50 no intervention is indicated at this time, they will follow in the am.   #Hypotension Possible sepsis given c/o abdominal pain, n/v although runs low at baseline, favor bradycardia related rather than septic CT abd/pelvis neg, CXR negative -Supplemental oxygen as needed,  to maintain SpO2 > 90% -f/u cultures, trend lactic/ PCT -Monitor WBC/ fever curve -Received abx in the ED, will hold further abx for now pendidng sepsis work up [employer] for MAP goal >65 -Will continue home midodrine as scheduled   #ESRD on HD #Hypokalemia #Hyponatremia Last HD 06/05/23, missed Dialysis 06/07/23  -Follow BMP -Ensure adequate renal  perfusion -Avoid nephrotoxic agents as able -Replace electrolytes as indicated -Nephrology consulted, no urgent need for HD now as she is quite hypotensive and bradycardic may not tolerate HD,  but will need HD in the morning   #CAD: Last cath: 12/2016  NSTEMI s/p PCI; LVEF of 65%  #PAF #HTN #HLD -Continue apixaban 5 mg twice daily -Continue Zetia 10 mg daily and rosuvastatin 10 mg daily -Will avoid AV nodal blocking due to slow ventricular response as above -Monitor electrolytes -Patient has been placed on ZIO XT monitor -Cardiology consult as above  #T2DM -Check Hgb A1c -CBG's q4 -SSI -Hold home meds for now  Best practice:  Diet:  Oral Pain/Anxiety/Delirium protocol (if indicated): No VAP protocol (if indicated): Not indicated DVT prophylaxis: Subcutaneous Heparin GI prophylaxis: PPI Glucose control:  SSI Yes Central venous access:  N/A Arterial line:  N/A Foley:  N/A Mobility:  bed rest  PT consulted: N/A Last date of multidisciplinary goals of care discussion [7/6] Code Status:  full code Disposition: ICU   = Goals of Care = Code Status Order: FULL  Primary Emergency Contact: Brower,[name], Home Phone: [phone] Wishes to pursue full aggressive treatment and intervention options, including CPR and intubation,   Critical care time: 45 minutes        Webb Silversmith DNP, CCRN, FNP-C, AGACNP-BC Acute Care & Family Nurse Practitioner [facility] PCCM on call pager [phone]

## 2023-06-07 NOTE — ED Triage Notes (Signed)
Pt to ED [facility] from dialysis for CC weakness since Tuesday (4d ago). Pt gets Tue Thur Sat dialysis  EMS report: NVD and LLQ abdominal pain since last night. States tried to eat yesterday, each time she ate she vomited, then had diarrhea last night. No NVD today 72/42 BP taken at dialysis, pt did not get dialysis treatment  EMS VS: 86/50, HR 48 (normal for her), 12 lead SB, 96% on RA, CBG 139, T 97.4  2 EMS IV attempts. L arm restricted Pt states has had to take midodrine before for low BP

## 2023-06-07 NOTE — ED Provider Notes (Signed)
[facility] Provider Note    Event Date/Time   First MD Initiated Contact with Patient 06/07/23 1215     (approximate)   History   Weakness   HPI  Jacqueline [name] is a 77 y.o. female  here with generalized weakness. Pt was sent from dialysis. She reports that she has a long h/o hypotension on midodrine. She is not always given it like she should at her facility. Reports she went to scheduled HD today, had felt weak after having some loose BMs yesterday and an episode of emesis today. Her BP was lower than usual so she was sent in. Denies any current complaints - no CP, lightheadedness, or dizziness. No cough.       Physical Exam   Triage Vital Signs: ED Triage Vitals  Enc Vitals Group     BP 06/07/23 1222 (!) 81/51     Pulse Rate 06/07/23 1222 (!) 50     Resp 06/07/23 1222 16     Temp 06/07/23 1222 97.9 F (36.6 C)     Temp Source 06/07/23 1222 Oral     SpO2 06/07/23 1251 91 %     Weight 06/07/23 1218 253 lb (114.8 kg)     Height 06/07/23 1218 5\' 4"  (1.626 m)     Head Circumference --      Peak Flow --      Pain Score 06/07/23 1217 0     Pain Loc --      Pain Edu? --      Excl. in GC? --     Most recent vital signs: Vitals:   06/07/23 1437 06/07/23 1500  BP: (!) 87/40 (!) 85/46  Pulse:    Resp: 10 15  Temp:    SpO2:       General: Awake, no distress.  CV:  Good peripheral perfusion. RRR. Resp:  Normal work of breathing. Lungs clear. Abd:  No distention. No tenderness. No rebound or guarding. Other:  Mildly dry MM. Left UE AVF in place.    ED Results / Procedures / Treatments   Labs (all labs ordered are listed, but only abnormal results are displayed) Labs Reviewed  BASIC METABOLIC PANEL - Abnormal; Notable for the following components:      Result Value   Sodium 133 (*)    Potassium 3.4 (*)    Chloride 92 (*)    Glucose, Bld 130 (*)    BUN 55 (*)    Creatinine, Ser 7.76 (*)    Calcium 7.7 (*)    GFR, Estimated 5  (*)    All other components within normal limits  CBC - Abnormal; Notable for the following components:   WBC 11.5 (*)    MCV 102.0 (*)    RDW 15.7 (*)    Platelets 146 (*)    All other components within normal limits  CULTURE, BLOOD (ROUTINE X 2)  CULTURE, BLOOD (ROUTINE X 2)  URINE CULTURE  LACTIC ACID, PLASMA  LIPASE, BLOOD  TSH  LACTIC ACID, PLASMA  CBG MONITORING, ED     EKG Atrial fibrillation, VR 30. QRS 119, QTc 427. No acute ST elevations or depressions. No ischemia or infarct.   RADIOLOGY CXR: Clear CT A/P: No acute abnormality   I also independently reviewed and agree with radiologist interpretations.   PROCEDURES:  Critical Care performed: Yes, see critical care procedure note(s)  .Critical Care  Performed by: Shaune Pollack, MD Authorized by: Shaune Pollack, MD   Critical  care provider statement:    Critical care time (minutes):  30   Critical care time was exclusive of:  Separately billable procedures and treating other patients   Critical care was necessary to treat or prevent imminent or life-threatening deterioration of the following conditions:  Circulatory failure, cardiac failure, respiratory failure and sepsis   Critical care was time spent personally by me on the following activities:  Development of treatment plan with patient or surrogate, discussions with consultants, evaluation of patient's response to treatment, examination of patient, ordering and review of laboratory studies, ordering and review of radiographic studies, ordering and performing treatments and interventions, pulse oximetry, re-evaluation of patient's condition and review of old charts     MEDICATIONS ORDERED IN ED: Medications  metroNIDAZOLE (FLAGYL) IVPB 500 mg (500 mg Intravenous New Bag/Given 06/07/23 1519)  vancomycin (VANCOCIN) IVPB 1000 mg/200 mL premix (1,000 mg Intravenous New Bag/Given 06/07/23 1517)  sodium chloride 0.9 % bolus 500 mL (0 mLs Intravenous Stopped  06/07/23 1400)  midodrine (PROAMATINE) tablet 10 mg (10 mg Oral Given 06/07/23 1311)  ceFEPIme (MAXIPIME) 2 g in sodium chloride 0.9 % 100 mL IVPB (0 g Intravenous Stopped 06/07/23 1512)  iohexol (OMNIPAQUE) 300 MG/ML solution 100 mL (100 mLs Intravenous Contrast Given 06/07/23 1403)  atropine injection 0.4 mg (0.4 mg Intravenous Given 06/07/23 1512)     IMPRESSION / MDM / ASSESSMENT AND PLAN / ED COURSE  I reviewed the triage vital signs and the nursing notes.                              Differential diagnosis includes, but is not limited to, sepsis, hypotension 2/2 CHF & ESRD, liver failure, anemia, ischemia/ACS  Patient's presentation is most consistent with acute presentation with potential threat to life or bodily function.  The patient is on the cardiac monitor to evaluate for evidence of arrhythmia and/or significant heart rate changes  77 yo F with h/o ESRD, hypotension on midodrine, here with generalized weakness. Pt arrives with significant bradycardia (HR 40-50) and hypotension. Given midodrine (was due for this) and 500cc in setting of her poor pO intake and reported vomiting and diarrhea. CXR, CT scan obtained and show no signs of infection or other abnormality. Her lytes are c/w dehydration, esp in setting of not having HD today. She is o/w stable but remains hypotensive. Will trial a low-dose of atropine to see if bradycardia improves and if BP improves with this, but will plan to admit for obs given the persistence of her hypotension. DIscussed with Dr. Mariah Milling of cards who is in agreement with gentle fluids/midodrine and will discuss with intensivist as pt may ultimately need pressors. Cultures were sent and pt given IV ABX in setting of her hypotension of unknown source.     FINAL CLINICAL IMPRESSION(S) / ED DIAGNOSES   Final diagnoses:  Hypotension, unspecified hypotension type  Bradycardia     Rx / DC Orders   ED Discharge Orders     None        Note:  This document  was prepared using Dragon voice recognition software and may include unintentional dictation errors.   Shaune Pollack, MD 06/07/23 [phone]

## 2023-06-07 NOTE — ED Notes (Signed)
ICU MD at bedside. Aware of low BP. Ordered NS bolus

## 2023-06-07 NOTE — Consult Note (Signed)
PHARMACY -  BRIEF ANTIBIOTIC NOTE   Pharmacy has received consult(s) for cefepime and vancomycin from an ED provider. The patient's profile has been reviewed for ht/wt/allergies/indication/available labs.    ESRD on HD TuThSa, did not get dialysis today due to hypotension  One time order(s) placed for  --Cefepime 2 g IV --Vancomycin 1 g IV  Further antibiotics/pharmacy consults should be ordered by admitting physician if indicated.                       Thank you, Tressie Ellis 06/07/2023  1:47 PM

## 2023-06-07 NOTE — Progress Notes (Signed)
eLink Physician-Brief Progress Note Patient Name: Jacqueline [name] DOB: 1945-12-07 MRN: [id number]   Date of Service  06/07/2023  HPI/Events of Note  Patient in ICU for hypotension, bradycardia and with chronic hypotension and ESRD on HD (on midodrine). Admitted with nausea and vomiting. Given fluids, midodrine, vanc, cefepime and flagyl in ER. Awake on camera. HR is 45. Map is 55. No resp distress. Cardiology to be consulted per notes  eICU Interventions  CCM to admit. Call E link if needed     Intervention Category Major Interventions: Sepsis - evaluation and management Evaluation Type: New Patient Evaluation  [name] 06/07/2023, 10:26 PM

## 2023-06-07 NOTE — Progress Notes (Addendum)
Admission for hypotension. Pt is alert awake and oriented afebrile.  Crackles up to midlung field posteriorly.  Bradycardia and 31 on tele, hypotension noted.  Per edmd Dr.Robinson pt was also given atropine which pt did not respond to. Pt is cold and hypoperfusing.  Code status verified with son at bedside and pt is Full Code.  Pt is HD and has PRN oxygen at home. Sees Dr.Arida Cardiology and last echo in 2023 showed EF of 65%. D/W ICU NP about concern for bradycardia causing hypotension and need for possible pacing tonight and or Vasopressor for bradycardia. Pt needs 2 d echo and cardiology consult. Informed pt's son that the hypotension could be from bradycardia alone or from cardiac etiology or VTE in combination of HD.  Additional dose of midodrine ordered.

## 2023-06-07 NOTE — Progress Notes (Signed)
Brief Note:  Asked to evaluate the patient for admission to Icu secondary to hypotension. Blood pressure measurements noted for significant diastolic hypotension, out of proportion to symptoms. Patient reports feeling better since presenting to the ED and following fluid administration. She reports diarrhea and N/V overnight. Attempted to re-measure the blood pressure and again noted inconsistent readings.  Switched to a manual sphygmomanometer and was able to measure a BP of 80/50 mmHg in the right arm. Given normal lactic acid, mentation, and overall clinic appearance, recommend a further bolus of 500 mL of normal saline. I will re-evaluate the patient in an hour to re-measure blood pressure and assess suitability for ICU admission.  Raechel Chute, MD [facility] 06/07/2023 3:49 PM

## 2023-06-07 NOTE — Plan of Care (Signed)
Discussed with patient plan of care for the evening, pain management and admission questions with some teach back displayed  Problem: Education: Goal: Knowledge of General Education information will improve Description: Including pain rating scale, medication(s)/side effects and non-pharmacologic comfort measures Outcome: Progressing   Problem: Health Behavior/Discharge Planning: Goal: Ability to manage health-related needs will improve Outcome: Progressing   

## 2023-06-07 NOTE — ED Notes (Signed)
Retaking BP to get accurate reading. Moved BP cuff to lower arm. Pt is alert and oriented. BP reading 75/32 MAP of 42. EDP informed.

## 2023-06-07 NOTE — ED Notes (Signed)
ICU MD came and assessed pt at bedside. ICU MD took manual BP which was 80/50. HR currently 50. No new orders at this time.

## 2023-06-08 ENCOUNTER — Encounter: Payer: Self-pay | Admitting: Student in an Organized Health Care Education/Training Program

## 2023-06-08 ENCOUNTER — Inpatient Hospital Stay
Admit: 2023-06-08 | Discharge: 2023-06-08 | Disposition: A | Discharge status: Home / Self Care | Payer: Medicare Other | Attending: Nurse Practitioner | Admitting: Nurse Practitioner

## 2023-06-08 DIAGNOSIS — I272 Pulmonary hypertension, unspecified: Secondary | ICD-10-CM

## 2023-06-08 DIAGNOSIS — R579 Shock, unspecified: Secondary | ICD-10-CM | POA: Diagnosis not present

## 2023-06-08 DIAGNOSIS — I959 Hypotension, unspecified: Secondary | ICD-10-CM | POA: Diagnosis not present

## 2023-06-08 DIAGNOSIS — I5031 Acute diastolic (congestive) heart failure: Secondary | ICD-10-CM | POA: Diagnosis not present

## 2023-06-08 DIAGNOSIS — R001 Bradycardia, unspecified: Secondary | ICD-10-CM | POA: Diagnosis not present

## 2023-06-08 DIAGNOSIS — I4821 Permanent atrial fibrillation: Secondary | ICD-10-CM

## 2023-06-08 DIAGNOSIS — N186 End stage renal disease: Secondary | ICD-10-CM | POA: Diagnosis not present

## 2023-06-08 DIAGNOSIS — I2510 Atherosclerotic heart disease of native coronary artery without angina pectoris: Secondary | ICD-10-CM

## 2023-06-08 DIAGNOSIS — E785 Hyperlipidemia, unspecified: Secondary | ICD-10-CM

## 2023-06-08 LAB — HEMOGLOBIN A1C
Hgb A1c MFr Bld: 5.8 % — ABNORMAL HIGH (ref 4.8–5.6)
Mean Plasma Glucose: 119.76 mg/dL

## 2023-06-08 LAB — CBC
HCT: 40.2 % (ref 36.0–46.0)
Hemoglobin: 12.6 g/dL (ref 12.0–15.0)
MCH: 31 pg (ref 26.0–34.0)
MCHC: 31.3 g/dL (ref 30.0–36.0)
MCV: 98.8 fL (ref 80.0–100.0)
Platelets: 138 10*3/uL — ABNORMAL LOW (ref 150–400)
RBC: 4.07 MIL/uL (ref 3.87–5.11)
RDW: 15.5 % (ref 11.5–15.5)
WBC: 12.4 10*3/uL — ABNORMAL HIGH (ref 4.0–10.5)
nRBC: 0 % (ref 0.0–0.2)

## 2023-06-08 LAB — GLUCOSE, CAPILLARY
Glucose-Capillary: 115 mg/dL — ABNORMAL HIGH (ref 70–99)
Glucose-Capillary: 142 mg/dL — ABNORMAL HIGH (ref 70–99)
Glucose-Capillary: 167 mg/dL — ABNORMAL HIGH (ref 70–99)
Glucose-Capillary: 176 mg/dL — ABNORMAL HIGH (ref 70–99)
Glucose-Capillary: 213 mg/dL — ABNORMAL HIGH (ref 70–99)
Glucose-Capillary: 89 mg/dL (ref 70–99)

## 2023-06-08 LAB — ECHOCARDIOGRAM COMPLETE
AR max vel: 1.59 cm2
AV Area VTI: 1.88 cm2
AV Area mean vel: 1.54 cm2
AV Mean grad: 6 mmHg
AV Peak grad: 10.5 mmHg
Ao pk vel: 1.62 m/s
Area-P 1/2: 3.31 cm2
Height: 64 in
S' Lateral: 3.2 cm
Weight: 4077.63 oz

## 2023-06-08 LAB — BASIC METABOLIC PANEL
Anion gap: 16 — ABNORMAL HIGH (ref 5–15)
BUN: 62 mg/dL — ABNORMAL HIGH (ref 8–23)
CO2: 25 mmol/L (ref 22–32)
Calcium: 7.7 mg/dL — ABNORMAL LOW (ref 8.9–10.3)
Chloride: 92 mmol/L — ABNORMAL LOW (ref 98–111)
Creatinine, Ser: 8.41 mg/dL — ABNORMAL HIGH (ref 0.44–1.00)
GFR, Estimated: 4 mL/min — ABNORMAL LOW (ref >60)
Glucose, Bld: 162 mg/dL — ABNORMAL HIGH (ref 70–99)
Potassium: 4.1 mmol/L (ref 3.5–5.1)
Sodium: 133 mmol/L — ABNORMAL LOW (ref 135–145)

## 2023-06-08 LAB — CULTURE, BLOOD (ROUTINE X 2)
Culture: NO GROWTH
Culture: NO GROWTH
Special Requests: ADEQUATE

## 2023-06-08 LAB — PHOSPHORUS: Phosphorus: 5.3 mg/dL — ABNORMAL HIGH (ref 2.5–4.6)

## 2023-06-08 LAB — MAGNESIUM: Magnesium: 2 mg/dL (ref 1.7–2.4)

## 2023-06-08 MED ORDER — PIPERACILLIN-TAZOBACTAM IN DEX 2-0.25 GM/50ML IV SOLN
2.2500 g | Freq: Three times a day (TID) | INTRAVENOUS | Status: DC
  Administered 2023-06-08 – 2023-06-09 (×3): 2.25 g via INTRAVENOUS
  Filled 2023-06-08 (×4): qty 50

## 2023-06-08 MED ORDER — EZETIMIBE 10 MG PO TABS
10.0000 mg | ORAL_TABLET | Freq: Every day | ORAL | Status: DC
  Administered 2023-06-09 – 2023-06-30 (×21): 10 mg via ORAL
  Filled 2023-06-08 (×23): qty 1

## 2023-06-08 MED ORDER — ROSUVASTATIN CALCIUM 10 MG PO TABS
10.0000 mg | ORAL_TABLET | Freq: Every day | ORAL | Status: DC
  Administered 2023-06-09 – 2023-06-30 (×22): 10 mg via ORAL
  Filled 2023-06-08 (×23): qty 1

## 2023-06-08 MED ORDER — NOREPINEPHRINE 4 MG/250ML-% IV SOLN
2.0000 ug/min | INTRAVENOUS | Status: DC
  Administered 2023-06-08: 2 ug/min via INTRAVENOUS
  Administered 2023-06-09: 4 ug/min via INTRAVENOUS
  Administered 2023-06-09: 8 ug/min via INTRAVENOUS
  Administered 2023-06-10: 7 ug/min via INTRAVENOUS
  Administered 2023-06-11: 1 ug/min via INTRAVENOUS
  Administered 2023-06-11: 6 ug/min via INTRAVENOUS
  Administered 2023-06-12: 5 ug/min via INTRAVENOUS
  Administered 2023-06-12: 8 ug/min via INTRAVENOUS
  Administered 2023-06-12: 10 ug/min via INTRAVENOUS
  Administered 2023-06-14: 3 ug/min via INTRAVENOUS
  Administered 2023-06-15 (×2): 6 ug/min via INTRAVENOUS
  Administered 2023-06-16: 4 ug/min via INTRAVENOUS
  Administered 2023-06-17: 2 ug/min via INTRAVENOUS
  Filled 2023-06-08 (×12): qty 250

## 2023-06-08 MED ORDER — VANCOMYCIN HCL IN DEXTROSE 1-5 GM/200ML-% IV SOLN: 1000.0000 mg | INTRAVENOUS | Status: DC

## 2023-06-08 MED ORDER — ONDANSETRON HCL 4 MG/2ML IJ SOLN
4.0000 mg | Freq: Four times a day (QID) | INTRAMUSCULAR | Status: DC | PRN
  Administered 2023-06-08 – 2023-06-29 (×6): 4 mg via INTRAVENOUS
  Filled 2023-06-08 (×6): qty 2

## 2023-06-08 MED ORDER — SENNOSIDES-DOCUSATE SODIUM 8.6-50 MG PO TABS
1.0000 | ORAL_TABLET | Freq: Two times a day (BID) | ORAL | Status: DC
  Administered 2023-06-08 – 2023-06-30 (×42): 1 via ORAL
  Filled 2023-06-08 (×45): qty 1

## 2023-06-08 MED ORDER — ANTICOAGULANT SODIUM CITRATE 4% (200MG/5ML) IV SOLN: 5.0000 mL | Status: DC | PRN

## 2023-06-08 MED ORDER — LIDOCAINE-PRILOCAINE 2.5-2.5 % EX CREA: 1.0000 | TOPICAL_CREAM | CUTANEOUS | Status: DC | PRN

## 2023-06-08 MED ORDER — FLUDROCORTISONE ACETATE 0.1 MG PO TABS
0.1000 mg | ORAL_TABLET | Freq: Every day | ORAL | Status: DC
  Administered 2023-06-08 – 2023-06-12 (×5): 0.1 mg via ORAL
  Filled 2023-06-08 (×6): qty 1

## 2023-06-08 MED ORDER — BISACODYL 5 MG PO TBEC
10.0000 mg | DELAYED_RELEASE_TABLET | Freq: Every day | ORAL | Status: DC | PRN
  Administered 2023-06-12 – 2023-06-18 (×3): 10 mg via ORAL
  Filled 2023-06-08 (×3): qty 2

## 2023-06-08 MED ORDER — PENTAFLUOROPROP-TETRAFLUOROETH EX AERO: 1.0000 | INHALATION_SPRAY | CUTANEOUS | Status: DC | PRN

## 2023-06-08 MED ORDER — PREGABALIN 75 MG PO CAPS
100.0000 mg | ORAL_CAPSULE | Freq: Every day | ORAL | Status: DC
  Administered 2023-06-08 – 2023-06-30 (×23): 100 mg via ORAL
  Filled 2023-06-08 (×23): qty 1

## 2023-06-08 MED ORDER — ONDANSETRON HCL 4 MG/2ML IJ SOLN
INTRAMUSCULAR | Status: AC
  Filled 2023-06-08: qty 2

## 2023-06-08 MED ORDER — VANCOMYCIN HCL IN DEXTROSE 1-5 GM/200ML-% IV SOLN
1000.0000 mg | Freq: Once | INTRAVENOUS | Status: AC
  Administered 2023-06-08: 1000 mg via INTRAVENOUS
  Filled 2023-06-08 (×2): qty 200

## 2023-06-08 MED ORDER — PREGABALIN 25 MG PO CAPS
50.0000 mg | ORAL_CAPSULE | Freq: Two times a day (BID) | ORAL | Status: DC
  Administered 2023-06-08 – 2023-07-01 (×41): 50 mg via ORAL
  Filled 2023-06-08 (×41): qty 2

## 2023-06-08 MED ORDER — LORATADINE 10 MG PO TABS
10.0000 mg | ORAL_TABLET | Freq: Every day | ORAL | Status: DC
  Administered 2023-06-09 – 2023-06-30 (×23): 10 mg via ORAL
  Filled 2023-06-08 (×23): qty 1

## 2023-06-08 MED ORDER — CINACALCET HCL 30 MG PO TABS
30.0000 mg | ORAL_TABLET | Freq: Every day | ORAL | Status: DC
  Administered 2023-06-08 – 2023-06-12 (×4): 30 mg via ORAL
  Filled 2023-06-08 (×6): qty 1

## 2023-06-08 MED ORDER — SODIUM CHLORIDE 0.9 % IV SOLN
250.0000 mL | INTRAVENOUS | Status: DC
  Administered 2023-06-08: 250 mL via INTRAVENOUS

## 2023-06-08 MED ORDER — APIXABAN 5 MG PO TABS
5.0000 mg | ORAL_TABLET | Freq: Two times a day (BID) | ORAL | Status: DC
  Administered 2023-06-08 (×2): 5 mg via ORAL
  Filled 2023-06-08 (×3): qty 1

## 2023-06-08 MED ORDER — ALTEPLASE 2 MG IJ SOLR: 2.0000 mg | Freq: Once | INTRAMUSCULAR | Status: DC | PRN

## 2023-06-08 MED ORDER — PROCHLORPERAZINE EDISYLATE 10 MG/2ML IJ SOLN
10.0000 mg | Freq: Once | INTRAMUSCULAR | Status: AC
  Administered 2023-06-08: 10 mg via INTRAVENOUS
  Filled 2023-06-08: qty 2

## 2023-06-08 MED ORDER — FERRIC CITRATE 1 GM 210 MG(FE) PO TABS
420.0000 mg | ORAL_TABLET | Freq: Three times a day (TID) | ORAL | Status: DC
  Administered 2023-06-08 – 2023-06-13 (×10): 420 mg via ORAL
  Filled 2023-06-08 (×18): qty 2

## 2023-06-08 MED ORDER — CHLORHEXIDINE GLUCONATE CLOTH 2 % EX PADS
6.0000 | MEDICATED_PAD | Freq: Every day | CUTANEOUS | Status: DC
  Administered 2023-06-08 – 2023-06-13 (×6): 6 via TOPICAL

## 2023-06-08 MED ORDER — LIDOCAINE HCL (PF) 1 % IJ SOLN: 5.0000 mL | INTRAMUSCULAR | Status: DC | PRN

## 2023-06-08 MED ORDER — NOREPINEPHRINE 4 MG/250ML-% IV SOLN: 0.0000 ug/min | INTRAVENOUS | Status: DC

## 2023-06-08 MED ORDER — HEPARIN SODIUM (PORCINE) 1000 UNIT/ML DIALYSIS
1000.0000 [IU] | INTRAMUSCULAR | Status: DC | PRN
  Administered 2023-06-25: 1000 [IU]

## 2023-06-08 NOTE — Progress Notes (Signed)
There was order for Vasopressor. Assessed on Rt. Arm for USGPIV access (limited due to Lt. Fistula). There was no suitable vein for USGPIV and informed patient's RN regarding this assessment as well as MD. Lahoma Rocker RN

## 2023-06-08 NOTE — Consult Note (Signed)
Pharmacy Antibiotic Note  Jacqueline [name] is a 77 y.o. female admitted on 06/07/2023 with sepsis.  Pharmacy has been consulted for pip/tazo and vancomycin dosing. Afeb, WBC 11.5 > 12.4. Pt with ESRD on HD TTS.   Plan: Start pip/tazo 2.25 g q8H.   Pt received vancomycin 1000 mg x 1. Will give another vancomycin 1000 mg x 1 today and order vancomycin 1000 mg on HD days. Plan to order vancomycin level prior to the 3rd dialysis day.   Height: 5\' 4"  (162.6 cm) Weight: 115.6 kg (254 lb 13.6 oz) IBW/kg (Calculated) : 54.7  Temp (24hrs), Avg:97.8 F (36.6 C), Min:97.7 F (36.5 C), Max:97.9 F (36.6 C)  Recent Labs  Lab 06/07/23 1254 06/08/23 0726  WBC 11.5* 12.4*  CREATININE 7.76* 8.41*  LATICACIDVEN 1.3  --     Estimated Creatinine Clearance: 6.8 mL/min (A) (by C-G formula based on SCr of 8.41 mg/dL (H)).    Allergies  Allergen Reactions   Other Other (See Comments)   Sulfa Antibiotics Other (See Comments)   Sulfa Drugs Cross Reactors Other (See Comments)    Doesn't remember     Antimicrobials this admission: 7/6 cefepime x 1  7/7 vancomycin >>  7/7 pip/tazo >>   Dose adjustments this admission: None  Microbiology results: 7/6 BCx: pending 7/6 MRSA PCR: negative  Thank you for allowing pharmacy to be a part of this patient's care.  Ronnald Ramp, PharmD, BCPS 06/08/2023 10:43 AM

## 2023-06-08 NOTE — Plan of Care (Signed)
Continuing with plan of care. 

## 2023-06-08 NOTE — Consult Note (Signed)
PHARMACY CONSULT NOTE - FOLLOW UP  Pharmacy Consult for Electrolyte Monitoring and Replacement   Recent Labs: Potassium (mmol/L)  Date Value  06/08/2023 4.1   Magnesium (mg/dL)  Date Value  28/41/3244 2.0   Calcium (mg/dL)  Date Value  12/04/7251 7.7 (L)   Albumin (g/dL)  Date Value  66/44/0347 3.8  10/10/2021 4.1   Phosphorus (mg/dL)  Date Value  42/59/5638 5.3 (H)   Sodium (mmol/L)  Date Value  06/08/2023 133 (L)  10/10/2021 142     Assessment: 77 y.o female with significant PMH of HTN, T2DM,ESRD on HD TTS, Chronic debility , CAD s/p stent, PAF,  OSA, PAD, Left knee arthritis, Chronic lower back pain,GERD,Obesity , HLD  who presented to the ED  from Dialysis with chief complaints of generalized weakness x 4 days.   Goal of Therapy:  WNL  Plan:  No replacement needed F/u with AM labs.   Ronnald Ramp ,PharmD Clinical Pharmacist 06/08/2023 8:07 AM

## 2023-06-08 NOTE — NC FL2 (Signed)
[place] MEDICAID FL2 LEVEL OF CARE FORM     IDENTIFICATION  Patient Name: Jacqueline [name] Birthdate: [birth year] Sex: female Admission Date (Current Location): 06/07/2023  [facility] and [place] Number:  Chiropodist and Address:  [facility], [address] [place], [place] [address]      Provider Number: [id number]  Attending Physician Name and Address:  Raechel Chute, MD  Relative Name and Phone Number:  Mariel Craft [phone]    Current Level of Care: Hospital Recommended Level of Care: Nursing Facility Prior Approval Number:    Date Approved/Denied:   PASRR Number: [id number]  Discharge Plan: Other (Comment) ([facility])    Current Diagnoses: Patient Active Problem List   Diagnosis Date Noted   Morbid obesity (HCC) 06/08/2023   Symptomatic bradycardia 06/07/2023   Hyperkalemia 09/30/2022   Constipation 09/30/2022   AMS (altered mental status) 09/28/2022   Hypotension    Atrial fibrillation with slow ventricular response (HCC)    Pressure injury of skin 05/22/2022   General weakness 05/22/2022   Weakness 05/21/2022   Anaphylactic shock, unspecified, initial encounter 06/29/2020   Sepsis (HCC) 06/22/2019   Pain due to onychomycosis of toenails of both feet 06/11/2019   Callus 06/11/2019   Other disorders of phosphorus metabolism 05/20/2019   Mixed hyperlipidemia 03/22/2019   Otalgia of both ears 03/22/2019   Right arm pain 03/22/2019   Bradycardia 10/01/2017   Dyslipidemia 10/01/2017   Dependence on renal dialysis (HCC) 09/01/2017   Chest pain, unspecified 12/06/2016   Non-ST elevation (NSTEMI) myocardial infarction (HCC) 12/06/2016   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   History of NSTEMI    CAD (coronary artery disease) 12/03/2016   Hypokalemia 05/03/2016   Pruritus, unspecified 03/27/2015   Encounter for removal of sutures 11/30/2014   Pain in lower limb 08/26/2014   Encounter for immunization 06/10/2014    Unspecified protein-calorie malnutrition (HCC) 02/28/2014   Allergy, unspecified, initial encounter 02/15/2014   HCAP (healthcare-associated pneumonia) 02/13/2014   Absence of both cervix and uterus, acquired 02/01/2014   Coagulation defect, unspecified (HCC) 02/01/2014   Dyspnea, unspecified 02/01/2014   Family history of diabetes mellitus 02/01/2014   Family history of ischemic heart disease and other diseases of the circulatory system 02/01/2014   Fever, unspecified 02/01/2014   Headache, unspecified 02/01/2014   History of allergy to sulfonamides 02/01/2014   Iron deficiency anemia, unspecified 02/01/2014   Long term (current) use of insulin (HCC) 02/01/2014   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   OSA (obstructive sleep apnea) 01/21/2014   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HCC) 01/21/2014   Acute encephalopathy 01/19/2014   Congestive heart failure (HCC) 01/15/2014   End stage renal disease (HCC) 01/15/2014   Hypertension 01/15/2014   Diabetes mellitus (HCC) 01/15/2014   Anemia 01/15/2014   Onychomycosis 03/26/2013   Pain in joint, ankle and foot 03/26/2013    Orientation RESPIRATION BLADDER Height & Weight     Self, Situation, Time, Place  Other (Comment) (Nasal Cannula) Continent Weight: 254 lb 13.6 oz (115.6 kg) Height:  5\' 4"  (162.6 cm)  BEHAVIORAL SYMPTOMS/MOOD NEUROLOGICAL BOWEL NUTRITION STATUS      Continent Diet (Carb Modified with fluid restriction)  AMBULATORY STATUS COMMUNICATION OF NEEDS Skin   Limited Assist Verbally Normal                       Personal Care Assistance Level of Assistance  Bathing, Feeding, Dressing, Total care Bathing Assistance: Limited assistance Feeding assistance: Independent Dressing Assistance: Limited assistance  Total Care Assistance: Limited assistance   Functional Limitations Info  Sight, Hearing, Speech Sight Info: Adequate Hearing Info: Adequate Speech Info: Adequate    SPECIAL CARE FACTORS  FREQUENCY                       Contractures Contractures Info: Not present    Additional Factors Info  Code Status Code Status Info: FULL             Current Medications (06/08/2023):  This is the current hospital active medication list Current Facility-Administered Medications  Medication Dose Route Frequency Provider Last Rate Last Admin   0.9 %  sodium chloride infusion  250 mL Intravenous Continuous Sharen Hones, RPH 10 mL/hr at 06/08/23 1611 250 mL at 06/08/23 1611   alteplase (CATHFLO ACTIVASE) injection 2 mg  2 mg Intracatheter Once PRN Wendee Beavers, NP       anticoagulant sodium citrate solution 5 mL  5 mL Intracatheter PRN Wendee Beavers, NP       apixaban (ELIQUIS) tablet 5 mg  5 mg Oral BID Jimmye Norman, NP   5 mg at 06/08/23 0257   bisacodyl (DULCOLAX) EC tablet 10 mg  10 mg Oral Daily PRN Jimmye Norman, NP       Chlorhexidine Gluconate Cloth 2 % PADS 6 each  6 each Topical Daily Ouma, Hubbard Hartshorn, NP       cinacalcet (SENSIPAR) tablet 30 mg  30 mg Oral Q supper Jimmye Norman, NP   30 mg at 06/08/23 1609   docusate sodium (COLACE) capsule 100 mg  100 mg Oral BID PRN Jimmye Norman, NP       DOPamine (INTROPIN) 800 mg in dextrose 5 % 250 mL (3.2 mg/mL) infusion  0-5 mcg/kg/min Intravenous Titrated Jimmye Norman, NP 21.7 mL/hr at 06/08/23 1300 10 mcg/kg/min at 06/08/23 1300   ezetimibe (ZETIA) tablet 10 mg  10 mg Oral Daily Jimmye Norman, NP       ferric citrate (AURYXIA) tablet 420 mg  420 mg Oral TID WC Jimmye Norman, NP   420 mg at 06/08/23 1608   heparin injection 1,000 Units  1,000 Units Intracatheter PRN Wendee Beavers, NP       insulin aspart (novoLOG) injection 0-6 Units  0-6 Units Subcutaneous Q4H Jimmye Norman, NP   2 Units at 06/08/23 1610   lidocaine (PF) (XYLOCAINE) 1 % injection 5 mL  5 mL Intradermal PRN Wendee Beavers, NP       lidocaine-prilocaine  (EMLA) cream 1 Application  1 Application Topical PRN Wendee Beavers, NP       loratadine (CLARITIN) tablet 10 mg  10 mg Oral Daily Ouma, Hubbard Hartshorn, NP       norepinephrine (LEVOPHED) 4mg  in (0.016 mg/mL) premix infusion  2-10 mcg/min Intravenous Titrated Sharen Hones, RPH       ondansetron [facility]) injection 4 mg  4 mg Intravenous Q6H PRN Jimmye Norman, NP   4 mg at 06/08/23 1606   pentafluoroprop-tetrafluoroeth (GEBAUERS) aerosol 1 Application  1 Application Topical PRN Wendee Beavers, NP       piperacillin-tazobactam (ZOSYN) IVPB 2.25 g  2.25 g Intravenous Q8H Ronnald Ramp, RPH 100 mL/hr at 06/08/23 1237 2.25 g at 06/08/23 1237   polyethylene glycol (MIRALAX / GLYCOLAX) packet 17 g  17 g Oral Daily PRN Jimmye Norman, NP       pregabalin (LYRICA) capsule 100 mg  100 mg  Oral QHS Jimmye Norman, NP   100 mg at 06/08/23 0257   pregabalin (LYRICA) capsule 50 mg  50 mg Oral BID Jimmye Norman, NP   50 mg at 06/08/23 1608   rosuvastatin (CRESTOR) tablet 10 mg  10 mg Oral Daily Ouma, Hubbard Hartshorn, NP       senna-docusate (Senokot-S) tablet 1 tablet  1 tablet Oral BID Jimmye Norman, NP       vancomycin (VANCOCIN) IVPB 1000 mg/200 mL premix  1,000 mg Intravenous Once Ronnald Ramp, RPH 200 mL/hr at 06/08/23 1555 1,000 mg at 06/08/23 1555   Followed by   Melene Muller ON 06/10/2023] vancomycin (VANCOCIN) IVPB 1000 mg/200 mL premix  1,000 mg Intravenous Q T,Th,Sa-HD Ronnald Ramp, [facility]         Discharge Medications: Please see discharge summary for a list of discharge medications.  Relevant Imaging Results:  Relevant Lab Results:   Additional Information [id number]  Argil Mahl E Abdulkadir Emmanuel, LCSW

## 2023-06-08 NOTE — Progress Notes (Signed)
Throughout the morning patient had intermittent bouts of nausea and vomiting and was unable to take her po morning medications.  Dr. Mariah Milling notified as one of the medications is eliquis.

## 2023-06-08 NOTE — Consult Note (Signed)
Cardiology Consult    Patient ID: Jacqueline [name] MRN: [id number], DOB/AGE: 79/21/1947   Admit date: 06/07/2023 Date of Consult: 06/08/2023  Primary Physician: Macky Lower, MD Primary Cardiologist: Lorine Bears, MD Requesting Provider: Silverio Decamp, MD  Patient Profile    Jacqueline [name] is a 77 y.o. female with a history of CAD status post RCA stenting, end-stage renal disease on hemodialysis, hypertension, hypotension, hyperlipidemia, bradycardia, persistent atrial fibrillation, type 2 diabetes mellitus, peripheral arterial disease, sleep apnea, and obesity who is being seen today for the evaluation of slow A-fib and hypertension at the request of Dr. Aundria Rud.  Past Medical History   Past Medical History:  Diagnosis Date   Arthritis    "left knee" (12/04/2016)   CAD (coronary artery disease)    a. 12/2016 NSTEMI/PCI: LM nl, LaD 20p/d, D1 20, OM2 20, OM3 20, RCA 38m (3.0x18 Resolute DES). EF 65%.   Chronic lower back pain    Diverticulosis    on CT   ESRD on dialysis [facility])    GERD (gastroesophageal reflux disease)    Hiatal hernia    History of echocardiogram    a. 09/2022 Echo: EF 60-65%, mild asymm LVH, mod reduced RV fxn, RVSP . Mildly dil RA. Mild AI. Ao sclerosis. Mild MR.   Hyperlipidemia    Hypertension    a. 12/2016 labetalol d/c'd 2/2 hypotension in HD.   Morbid obesity (HCC)    Obesity    Persistent atrial fibrillation (HCC)    a. 05/2023 Zio: 100% afib avg HR 52 (32-99).   Pneumonia    "couple times" (12/04/2016)   Type II diabetes mellitus (HCC)     Past Surgical History:  Procedure Laterality Date   ABDOMINAL HYSTERECTOMY     AV FISTULA PLACEMENT Left 01/27/2014   Procedure: ARTERIOVENOUS (AV) FISTULA CREATION;  Surgeon: Pryor Ochoa, MD;  Location: [facility] OR;  Service: Vascular;  Laterality: Left;   AV FISTULA REPAIR     "had it cleaned out"   CARDIAC CATHETERIZATION  ?1980s   CARDIAC CATHETERIZATION N/A 12/04/2016   Procedure: Left Heart Cath  Angiography;  Surgeon: Kathleene Hazel, MD;  Location: [facility] INVASIVE CV LAB;  Service: Cardiovascular;  Laterality: N/A;   CARDIAC CATHETERIZATION N/A 12/04/2016   Procedure: Coronary Stent Intervention;  Surgeon: Kathleene Hazel, MD;  Location: [facility] INVASIVE CV LAB;  Service: Cardiovascular;  Laterality: N/A;  Mid RCA   CATARACT EXTRACTION W/ INTRAOCULAR LENS IMPLANT Left    COLONOSCOPY W/ BIOPSIES AND POLYPECTOMY     CORONARY ANGIOPLASTY WITH STENT PLACEMENT  12/04/2016   ESOPHAGOGASTRODUODENOSCOPY     INSERTION OF DIALYSIS CATHETER N/A 01/19/2014   Procedure: INSERTION OF DIALYSIS CATHETER;  Surgeon: Larina Earthly, MD;  Location: [facility] OR;  Service: Vascular;  Laterality: N/A;     Allergies  Allergies  Allergen Reactions   Other Other (See Comments)   Sulfa Antibiotics Other (See Comments)   Sulfa Drugs Cross Reactors Other (See Comments)    Doesn't remember     History of Present Illness    77 y.o. female with a history of CAD status post RCA stenting, end-stage renal disease on hemodialysis, hypertension, hypotension, hyperlipidemia, bradycardia, persistent atrial fibrillation, type 2 diabetes mellitus, peripheral arterial disease, sleep apnea, and obesity.  She previously suffered a non-STEMI in 2018 and required drug-eluting stent placement to the right coronary artery.  In the setting of claudication and bilateral left greater than right foot pain, she was evaluated by Dr. Kirke Corin in  May 2023 with ABI showing noncompressible vessels on the right and an ABI of 0.6 on the left.  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  There were no stenosis above the knee bilaterally the monophasic waveforms on the left were suggestive of iliac disease.  She was conservatively managed.  In October 2023, she was seen in the emergency department with altered mental status, unresponsiveness, and hypotension.  Neurowork-up was unremarkable.  Echo showed an EF of 60 to 65% with  moderately reduced RV function, RVSP of 38 mmHg, mild AI and MR.  She has been having issues with presyncope, bradycardia, and hypotension and was hospitalized in January 2024 with similar.  She has been since been staying at [facility] with hemodialysis on Tuesdays, Thursdays, and Saturdays.  She notes that dialysis sessions are frequently interrupted due to hypotension.  At cardiology clinic visit in May 2024, she was placed on an event monitor due to reports of syncope, which patient did not remember.  ZIO monitor showed 100% A-fib burden with an average heart rate of 52 frequent bradycardia in the early morning hours, down to the 30s.  Patient was at dialysis on July 6, when she complained of generalized weakness over the preceding 4 days.  She was noted to be hypotensive with pressure of 72/42.  She was not dialyzed and instead sent to the emergency department.  Here, she was afebrile.  BP 81/51, HR 40 (afib), K 3.4, BUN/Creat 55/7.76, WBC 11.5, BNP 726, lactic acid 1.3.  CXR & CT abd/pelvis unremarkable.  Treated w/ midodrine and saline boluses (500 ml x 2), flagyl and vanc.  She was subsequently placed on dopamine with improvement in heart rates and blood pressures (70's/low 100's respectively).  Patient remains in atrial fibrillation this morning.  She denies any symptoms at this time and also says that she was asymptomatic yesterday.  She is not on any AV nodal blocking agents at home.  Inpatient Medications     apixaban  5 mg Oral BID   Chlorhexidine Gluconate Cloth  6 each Topical Daily   cinacalcet  30 mg Oral Q supper   ezetimibe  10 mg Oral Daily   ferric citrate  420 mg Oral TID WC   insulin aspart  0-6 Units Subcutaneous Q4H   loratadine  10 mg Oral Daily   pregabalin  100 mg Oral QHS   pregabalin  50 mg Oral BID   rosuvastatin  10 mg Oral Daily   senna-docusate  1 tablet Oral BID    Family History    Family History  Problem Relation Age of Onset   Diabetes Mother     Peripheral vascular disease Mother        amputation   CAD Father    Heart disease Father    Hypertension Father    Heart attack Father    CAD Sister    Cancer Brother        x 2 brothers, type unknown   Breast cancer Sister    She indicated that her mother is deceased. She indicated that her father is deceased. She indicated that two of her four sisters are deceased. She indicated that the status of her brother is unknown. She indicated that her maternal grandmother is deceased. She indicated that her maternal grandfather is deceased. She indicated that her paternal grandmother is deceased. She indicated that her paternal grandfather is deceased. She indicated that her daughter is alive. She indicated that all of  her three sons are alive.   Social History    Social History   Socioeconomic History   Marital status: Widowed    Spouse name: Not on file   Number of children: 4   Years of education: Not on file   Highest education level: Not on file  Occupational History   Occupation: retired  Tobacco Use   Smoking status: Former    Packs/day: 1.00    Years: 20.00    Additional pack years: 0.00    Total pack years: 20.00    Types: Cigarettes    Quit date: 1985    Years since quitting: 39.5   Smokeless tobacco: Never  Vaping Use   Vaping Use: Never used  Substance and Sexual Activity   Alcohol use: No    Alcohol/week: 0.0 standard drinks of alcohol   Drug use: No   Sexual activity: Not Currently  Other Topics Concern   Not on file  Social History Narrative   Not on file   Social Determinants of Health   Financial Resource Strain: Medium Risk (06/27/2021)   Overall Financial Resource Strain (CARDIA)    Difficulty of Paying Living Expenses: Somewhat hard  Food Insecurity: No Food Insecurity (06/07/2023)   Hunger Vital Sign    Worried About Running Out of Food in the Last Year: Never true    Ran Out of Food in the Last Year: Never true  Transportation Needs: No  Transportation Needs (06/07/2023)   PRAPARE - Administrator, Civil Service (Medical): No    Lack of Transportation (Non-Medical): No  Physical Activity: Inactive (06/27/2021)   Exercise Vital Sign    Days of Exercise per Week: 0 days    Minutes of Exercise per Session: 0 min  Stress: No Stress Concern Present (06/27/2021)   [employer] of Occupational Health - Occupational Stress Questionnaire    Feeling of Stress : Not at all  Social Connections: Not on file  Intimate Partner Violence: Not At Risk (06/07/2023)   Humiliation, Afraid, Rape, and Kick questionnaire    Fear of Current or Ex-Partner: No    Emotionally Abused: No    Physically Abused: No    Sexually Abused: No     Review of Systems    General:  +++ fatigue.  No chills, fever, night sweats or weight changes.  Cardiovascular:  No chest pain, dyspnea on exertion, edema, orthopnea, palpitations, paroxysmal nocturnal dyspnea.  +++ occas presyncope. Dermatological: No rash, lesions/masses Respiratory: No cough, +++ chronic dyspnea on exertion. Urologic: No hematuria, dysuria Abdominal:   +++ vomited clear liquid this am.  No diarrhea, bright red blood per rectum, melena, or hematemesis Neurologic:  No visual changes, wkns, changes in mental status. All other systems reviewed and are otherwise negative except as noted above.  Physical Exam    Blood pressure (!) 109/46, pulse 62, temperature 97.7 F (36.5 C), temperature source Oral, resp. rate 16, height 5\' 4"  (1.626 m), weight 115.6 kg, SpO2 99 %.  General: Pleasant, NAD Psych: Normal affect. Neuro: Alert and oriented X 3. Moves all extremities spontaneously. HEENT: Normal  Neck: Supple obese, difficult to gauge JVP.  No bruits.  Without bruits or masses. Lungs:  Resp regular and unlabored, diminished breath sounds at bilateral bases.Marland Kitchen Heart: Irregularly irregular, no s3, s4, or murmurs. Abdomen: Obese, soft, non-tender, non-distended, BS + x 4.   Extremities: No clubbing, cyanosis.  Trace bilateral lower extremity edema. DP/PT2+, Radials 2+ and equal bilaterally.  Labs  Lab Results  Component Value Date   WBC 12.4 (H) 06/08/2023   HGB 12.6 06/08/2023   HCT 40.2 06/08/2023   MCV 98.8 06/08/2023   PLT 138 (L) 06/08/2023    Recent Labs  Lab 06/08/23 0726  NA 133*  K 4.1  CL 92*  CO2 25  BUN 62*  CREATININE 8.41*  CALCIUM 7.7*  GLUCOSE 162*   Lab Results  Component Value Date   CHOL 101 09/29/2022   HDL 32 (L) 09/29/2022   LDLCALC 49 09/29/2022   TRIG 100 09/29/2022    Radiology Studies    DG Chest Portable 1 View  Result Date: 06/07/2023 CLINICAL DATA:  Sepsis.  Weakness. EXAM: PORTABLE CHEST 1 VIEW COMPARISON:  09/28/2022. FINDINGS: Stable heart size.The cardiomediastinal contours are unchanged with aortic atherosclerosis. The lungs are clear. Pulmonary vasculature is normal. No consolidation, pleural effusion, or pneumothorax. No acute osseous abnormalities are seen. IMPRESSION: No acute chest findings. Electronically Signed   By: Narda Rutherford M.D.   On: 06/07/2023 15:01   CT ABDOMEN PELVIS W CONTRAST  Result Date: 06/07/2023 CLINICAL DATA:  Abdominal pain. EXAM: CT ABDOMEN AND PELVIS WITH CONTRAST TECHNIQUE: Multidetector CT imaging of the abdomen and pelvis was performed using the standard protocol following bolus administration of intravenous contrast. RADIATION DOSE REDUCTION: This exam was performed according to the departmental dose-optimization program which includes automated exposure control, adjustment of the mA and/or kV according to patient size and/or use of iterative reconstruction technique. CONTRAST:  OMNIPAQUE IOHEXOL 300 MG/ML  SOLN COMPARISON:  05/29/2022 FINDINGS: Lower chest: Subsegmental atelectasis and/or linear scarring noted in the lung bases. Hepatobiliary: No suspicious focal abnormality within the liver parenchyma. There is no evidence for gallstones, gallbladder wall  thickening, or pericholecystic fluid. No intrahepatic or extrahepatic biliary dilation. Pancreas: No focal mass lesion. No dilatation of the main duct. No intraparenchymal cyst. No peripancreatic edema. Spleen: No splenomegaly. No suspicious focal mass lesion. Adrenals/Urinary Tract: No adrenal nodule or mass. Both kidneys are atrophic. Tiny well-defined homogeneous low-density lesions in both kidneys are too small to characterize but are statistically most likely benign and probably cysts. No followup imaging is recommended. 9.6 cm simple cyst exophytic from the upper interpolar left kidney is mildly progressive in the interval. No evidence for hydroureter. Bladder is decompressed. Stomach/Bowel: Stomach is unremarkable. No gastric wall thickening. No evidence of outlet obstruction. Duodenum is normally positioned as is the ligament of Treitz. No small bowel wall thickening. No small bowel dilatation. The terminal ileum is normal. The appendix is normal. No gross colonic mass. No colonic wall thickening. Vascular/Lymphatic: There is mild atherosclerotic calcification of the abdominal aorta without aneurysm. There is no gastrohepatic or hepatoduodenal ligament lymphadenopathy. No retroperitoneal or mesenteric lymphadenopathy. No pelvic sidewall lymphadenopathy. Reproductive: Hysterectomy.  There is no adnexal mass. Other: No intraperitoneal free fluid. Musculoskeletal: Marked laxity of the anterior abdominal wall fascia again noted. No worrisome lytic or sclerotic osseous abnormality. IMPRESSION: 1. Stable exam. No acute findings in the abdomen or pelvis. Specifically, no findings to explain the patient's history of abdominal pain. 2. Bilateral renal atrophy with multiple simple cysts again noted. No followup imaging is recommended. 3.  Aortic Atherosclerosis (ICD10-I70.0). Electronically Signed   By: Kennith Center M.D.   On: 06/07/2023 14:59   LONG TERM MONITOR (3-14 DAYS)  Result Date: 05/14/2023 Patch Wear  Time:  13 days and 23 hours (2024-05-15T13:34:58-0400 to 2024-05-29T13:34:54-0400) Atrial Fibrillation occurred continuously (100% burden), ranging from 32-99 bpm (avg of 52 bpm). Rare PVCs.  Most bradycardic events happened during early morning hours.   ECG & Cardiac Imaging    afib, 40, PVC, inc RBBB, lat q's - personally reviewed.  Assessment & Plan    1.  Hypotension: Patient has been having issues with hypotension in the outpatient setting, complicated by dialysis therapy.  She is on midodrine 20 mg daily at baseline and takes an additional 40 mg on dialysis days.  Presented July 6 from hemodialysis, prior to initiating session, due to hypotension with pressures in the 70's, as well as bradycardia with rates down into the 40s.  Heart rate and blood pressures improved on dopamine 10 mcg/kg/min at this time.  Wean dopamine as tolerated.  May need to consider addition of Florinef.  See below regarding bradycardia.  2.  Persistent slow atrial fibrillation: Recent monitoring performed in the setting of hypotension and reports of syncope, showing 100% A-fib burden with an average heart rate of 52, though she was noted to have bradycardia frequently during early morning hours, down to the 30s.  On arrival here yesterday, her heart rate was in the 40s, and she was hypotensive.  She is not on any AV nodal blocking agents.  Suspect bradycardia may be playing a role in her hypertension.  Currently on dopamine.  Wean as tolerated.  Will ask electrophysiology to weigh in.  She is anticoagulated with Eliquis.  3.  Coronary artery disease: Status post non-STEMI in 2018 with PCI and drug-eluting stent placement to the RCA at that time.  She has not been having any chest pain.  She is on statin and Zetia at home.  No aspirin in the setting of chronic Eliquis.  4.  Hyperlipidemia: LDL of 49 last October.  Continue statin and Zetia.  5.  End-stage renal disease: Hemodialysis per nephrology team.  6.  Type 2  diabetes mellitus: Insulin therapy per primary team.  Risk Assessment/Risk Scores:          CHA2DS2-VASc Score = 6   This indicates a 9.7% annual risk of stroke. The patient's score is based upon: CHF History: 0 HTN History: 1 Diabetes History: 1 Stroke History: 0 Vascular Disease History: 1 Age Score: 2 Gender Score: 1     Signed, Nicolasa Ducking, NP 06/08/2023, 11:51 AM  For questions or updates, please contact   Please consult [email or web address] for contact info under Cardiology/STEMI.

## 2023-06-08 NOTE — Progress Notes (Signed)
NAME:  Jacqueline [name], MRN:  [id number], DOB:  [birth year], LOS: 1 ADMISSION DATE:  06/07/2023, CHIEF COMPLAINT:  Hypotension   History of Present Illness:  77 y.o female with significant PMH of HTN, T2DM,ESRD on HD TTS, Chronic debility , CAD s/p stent, PAF,  OSA, PAD, Left knee arthritis, Chronic lower back pain,GERD,Obesity , HLD  who presented to the ED  from Dialysis with chief complaints of generalized weakness x 4 days.   EMS report: NVD and LLQ abdominal pain since last night. States tried to eat yesterday, each time she ate she vomited, then had diarrhea last night. No NVD today. She went to Dialysis today and was noted with low BP of 72/42. She usually runs lows BP and takes Midodrine but today her BP was lower than usually so she was not dialyzed but was sent to ED for further evaluation.   ED Course:  Initial vital signs showed HR of 50 beats/minute, BP 81/51 mm Hg, the RR 16 breaths/minute, and the oxygen saturation 91% on 2L and a temperature of 97.79F (36.6C).   Pertinent Labs/Diagnostics.   Na+/ K+: 133/3.4 Glucose:130 BUN/Cr.:55/7.76 WBC:11.5 Plts: 146 PCT: pending Lactic acid: 1.3 EKG:?Afib vs junctional bradycardia rate 40bmp. Incomplete RBB,   CXR, CT scan obtained and show no signs of infection or other abnormality. Patient given, midodrine, atropine for significant bradycardia and  500 cc of fluids and started on broad-spectrum antibiotics Vanco cefepime and Flagyl for  suspected sepsis of unknown source. Patient remained hypotensive despite  intervention as above. PCCM consulted for possible pressors.  Pertinent  Medical History    Arthritis      "left knee" (12/04/2016)   CAD (coronary artery disease)      a. 12/2016 NSTEMI/PCI: LM nl, LaD 20p/d, D1 20, OM2 20, OM3 20, RCA 68m (3.0x18 Resolute DES). EF 65%.   Chronic lower back pain     ESRD on dialysis [facility])      "[facility]; [address]; TTS" (12/04/2016)   GERD (gastroesophageal reflux disease)      Hyperlipidemia     Hypertension      a. 12/2016 labetalol d/c'd 2/2 hypotension in HD.   Morbid obesity (HCC)     Obesity     Pneumonia      "couple times" (12/04/2016)   Type II diabetes mellitus (HCC)     Significant Hospital Events: Including procedures, antibiotic start and stop dates in addition to other pertinent events   06/07/2023: admit to ICU with bradycardia, started on dopamine 06/08/2023: weaned off dopamine, hypotensive again, dopamine restarted  Interim History / Subjective:   Patient is awake, alert and oriented this morning. No complaints, feels well and at baseline.  Objective   Blood pressure 101/63, pulse 68, temperature 97.9 F (36.6 C), temperature source Oral, resp. rate 16, height 5\' 4"  (1.626 m), weight 115.6 kg, SpO2 100 %.        Intake/Output Summary (Last 24 hours) at 06/08/2023 1357 Last data filed at 06/08/2023 1300 Gross per 24 hour  Intake 1070.24 ml  Output 250 ml  Net 820.24 ml   Filed Weights   06/07/23 1218 06/07/23 2300 06/08/23 0439  Weight: 114.8 kg 115.6 kg 115.6 kg    Examination: Physical Exam Constitutional:      Appearance: Normal appearance. She is obese. She is not ill-appearing.  HENT:     Mouth/Throat:     Mouth: Mucous membranes are moist.  Cardiovascular:     Rate and Rhythm: Regular rhythm.  Bradycardia present.  Pulmonary:     Effort: Pulmonary effort is normal. No respiratory distress.     Breath sounds: Normal breath sounds. No wheezing.  Abdominal:     General: There is distension.     Palpations: Abdomen is soft.  Neurological:     General: No focal deficit present.     Mental Status: She is alert and oriented to person, place, and time. Mental status is at baseline.      Assessment & Plan:   Neurology No active issues, patient is at baseline.  Cardiovascular #Circulatory Shock #Bradycardia #History of CAD #Afib  Initiated dopamine overnight with improvement, will switch to nor-epinephrine today.  Patient's hemodynamics are stable but she remains hypotensive, requiring vasopressors. Lactic acid not elevated and there is no signs of end organ damage or altered mental status. Will hold off on placement of an arterial line and medically manage at the moment. Cardiology consult is appreciated. Broad spectrum antibiotics initiated while cultures are pending  -norepinephrine with SBP > 90 mmHg  Pulmonary On room air.  Gastrointestinal No active issues, diet ordered  Renal #ESRD on HD  Unable to get dialysis yesterday due to hypotension. Nephrology consulted today, they will proceed with HD tomorrow  Endocrine #T2DM  On sliding scale insulin. Will check cortisol level to assess for the likelihood of adrenal insufficiency. Will also check ACTH. If testing is inconclusive patient might require a stim test.  Hem/Onc  Continue Apixaban for afib.  ID  No clear sign of sepsis though patient is hypotensive. Blood cultures are drawn and will maintain her on broad spectrum antibiotics pending culture results.  -continue vanc/zosyn   Best Practice (right click and "Reselect all SmartList Selections" daily)   Diet/type: Regular consistency (see orders) DVT prophylaxis: DOAC GI prophylaxis: N/A Lines: N/A Foley:  N/A Code Status:  full code Last date of multidisciplinary goals of care discussion [06/08/2023]  Labs   CBC: Recent Labs  Lab 06/07/23 1254 06/08/23 0726  WBC 11.5* 12.4*  HGB 12.4 12.6  HCT 41.0 40.2  MCV 102.0* 98.8  PLT 146* 138*    Basic Metabolic Panel: Recent Labs  Lab 06/07/23 1254 06/08/23 0726  NA 133* 133*  K 3.4* 4.1  CL 92* 92*  CO2 27 25  GLUCOSE 130* 162*  BUN 55* 62*  CREATININE 7.76* 8.41*  CALCIUM 7.7* 7.7*  MG  --  2.0  PHOS  --  5.3*   GFR: Estimated Creatinine Clearance: 6.8 mL/min (A) (by C-G formula based on SCr of 8.41 mg/dL (H)). Recent Labs  Lab 06/07/23 1254 06/08/23 0726  WBC 11.5* 12.4*  LATICACIDVEN 1.3  --      Liver Function Tests: No results for input(s): "AST", "ALT", "ALKPHOS", "BILITOT", "PROT", "ALBUMIN" in the last 168 hours. Recent Labs  Lab 06/07/23 1254  LIPASE 51   No results for input(s): "AMMONIA" in the last 168 hours.  ABG    Component Value Date/Time   PHART 7.43 05/21/2022 2300   PCO2ART 42 05/21/2022 2300   PO2ART 66 (L) 05/21/2022 2300   HCO3 27.9 05/21/2022 2300   TCO2 31 01/19/2014 1614   ACIDBASEDEF 1.0 01/19/2014 1238   O2SAT 95.2 05/21/2022 2300     Coagulation Profile: No results for input(s): "INR", "PROTIME" in the last 168 hours.  Cardiac Enzymes: No results for input(s): "CKTOTAL", "CKMB", "CKMBINDEX", "TROPONINI" in the last 168 hours.  HbA1C: Hemoglobin A1C  Date/Time Value Ref Range Status  06/12/2021 12:00 AM 6.6  Final  Hgb A1c MFr Bld  Date/Time Value Ref Range Status  06/07/2023 07:44 PM 5.8 (H) 4.8 - 5.6 % Final    Comment:    (NOTE) Pre diabetes:          5.7%-6.4%  Diabetes:              >6.4%  Glycemic control for   <7.0% adults with diabetes   09/29/2022 04:55 PM 5.2 4.8 - 5.6 % Final    Comment:    (NOTE) Pre diabetes:          5.7%-6.4%  Diabetes:              >6.4%  Glycemic control for   <7.0% adults with diabetes     CBG: Recent Labs  Lab 06/07/23 1947 06/08/23 0026 06/08/23 0750 06/08/23 1137  GLUCAP 104* 115* 89 142*     Past Medical History:  She,  has a past medical history of Arthritis, CAD (coronary artery disease), Chronic lower back pain, Diverticulosis, ESRD on dialysis (HCC), GERD (gastroesophageal reflux disease), Hiatal hernia, History of echocardiogram, Hyperlipidemia, Hypertension, Morbid obesity (HCC), Obesity, Persistent atrial fibrillation (HCC), Pneumonia, and Type II diabetes mellitus (HCC).   Surgical History:   Past Surgical History:  Procedure Laterality Date   ABDOMINAL HYSTERECTOMY     AV FISTULA PLACEMENT Left 01/27/2014   Procedure: ARTERIOVENOUS (AV) FISTULA CREATION;   Surgeon: Pryor Ochoa, MD;  Location: [facility] OR;  Service: Vascular;  Laterality: Left;   AV FISTULA REPAIR     "had it cleaned out"   CARDIAC CATHETERIZATION  ?1980s   CARDIAC CATHETERIZATION N/A 12/04/2016   Procedure: Left Heart Cath Angiography;  Surgeon: Kathleene Hazel, MD;  Location: [facility] INVASIVE CV LAB;  Service: Cardiovascular;  Laterality: N/A;   CARDIAC CATHETERIZATION N/A 12/04/2016   Procedure: Coronary Stent Intervention;  Surgeon: Kathleene Hazel, MD;  Location: [facility] INVASIVE CV LAB;  Service: Cardiovascular;  Laterality: N/A;  Mid RCA   CATARACT EXTRACTION W/ INTRAOCULAR LENS IMPLANT Left    COLONOSCOPY W/ BIOPSIES AND POLYPECTOMY     CORONARY ANGIOPLASTY WITH STENT PLACEMENT  12/04/2016   ESOPHAGOGASTRODUODENOSCOPY     INSERTION OF DIALYSIS CATHETER N/A 01/19/2014   Procedure: INSERTION OF DIALYSIS CATHETER;  Surgeon: Larina Earthly, MD;  Location: [facility] OR;  Service: Vascular;  Laterality: N/A;     Social History:   reports that she quit smoking about 39 years ago. Her smoking use included cigarettes. She has a 20.00 pack-year smoking history. She has never used smokeless tobacco. She reports that she does not drink alcohol and does not use drugs.   Family History:  Her family history includes Breast cancer in her sister; CAD in her father and sister; Cancer in her brother; Diabetes in her mother; Heart attack in her father; Heart disease in her father; Hypertension in her father; Peripheral vascular disease in her mother.   Allergies Allergies  Allergen Reactions   Other Other (See Comments)   Sulfa Antibiotics Other (See Comments)   Sulfa Drugs Cross Reactors Other (See Comments)    Doesn't remember      Home Medications  Prior to Admission medications   Medication Sig Start Date End Date Taking? Authorizing Provider  acetaminophen (TYLENOL) 650 MG CR tablet Take 1,300 mg by mouth every 8 (eight) hours as needed for pain.    [provider]  albuterol  (VENTOLIN HFA) 108 (90 Base) MCG/ACT inhaler Inhale 1-2 puffs into the lungs every 6 (six) hours as  needed for wheezing or shortness of breath. 12/15/21   Franne Forts, DO  apixaban (ELIQUIS) 5 MG TABS tablet Take 1 tablet (5 mg total) by mouth 2 (two) times daily. 10/02/22   Tresa Moore, MD  B Complex-C-Zn-Folic Acid (DIALYVITE/ZINC) TABS Take 1 tablet by mouth daily.    [provider]  bisacodyl (DULCOLAX) 5 MG EC tablet Take 2 tablets (10 mg total) by mouth daily as needed for moderate constipation. 10/02/22   Tresa Moore, MD  cinacalcet (SENSIPAR) 30 MG tablet Take 30 mg by mouth daily with supper.    [provider]  ezetimibe (ZETIA) 10 MG tablet Take 1 tablet (10 mg total) by mouth daily. 04/30/22   Iran Ouch, MD  ferric citrate (AURYXIA) 1 GM 210 MG(Fe) tablet Take 420 mg by mouth 3 (three) times daily with meals.    [provider]  fexofenadine (ALLEGRA) 180 MG tablet Take 180 mg by mouth daily.    [provider]  midodrine (PROAMATINE) 10 MG tablet Take 20 mg by mouth daily. (Take additional two tablets [20mg ] for administration at dialysis on Tuesday, Thursday and Saturday)    [provider]  midodrine (PROAMATINE) 10 MG tablet Take 20 mg by mouth daily as needed (for SBP <100).    [provider]  Omega-3 Fatty Acids (FISH OIL) 1000 MG CPDR Take 1,000 mg by mouth daily.    [provider]  omeprazole (PRILOSEC) 20 MG capsule Take 1 capsule (20 mg total) by mouth daily. Patient taking differently: Take 20 mg by mouth 2 (two) times daily. 02/21/22   Arnette Felts, FNP  ondansetron (ZOFRAN-ODT) 4 MG disintegrating tablet Take 4 mg by mouth every 8 (eight) hours as needed for nausea or vomiting.    [provider]  polyethylene glycol (MIRALAX / GLYCOLAX) 17 g packet Take 17 g by mouth at bedtime.    [provider]  pregabalin (LYRICA) 100 MG capsule Take 100 mg by mouth at bedtime.     [provider]  pregabalin (LYRICA) 50 MG capsule Take 50 mg by mouth 2 (two) times daily.    [provider]  rosuvastatin (CRESTOR) 10 MG tablet Take 1 tablet (10 mg total) by mouth daily. 10/03/22   Tresa Moore, MD  sennosides-docusate sodium (SENOKOT-S) 8.6-50 MG tablet Take 1 tablet by mouth 2 (two) times daily.    [provider]  trolamine salicylate (ASPERCREME) 10 % cream Apply 1 Application topically 2 (two) times daily. (Apply to knees)    [provider]     Critical care time: 33 minutes    Raechel Chute, MD [facility] 06/08/2023 4:24 PM

## 2023-06-08 NOTE — Progress Notes (Signed)
IV team updated this nurse that there are no viable veins to place an ultrasound guided IV for levo and that current PIV is not adequate to infuse levo at this time.  Dr. Aundria Rud notified of IV access conditions along with the need for good IV access in order to run Levo and Dopamine continues at this time until appropriate access can be obtained.  No new orders at this time.

## 2023-06-08 NOTE — TOC Initial Note (Signed)
Transition of Care [facility]) - Initial/Assessment Note    Patient Details  Name: Jacqueline [name] MRN: [id number] Date of Birth: 01/18/1946  Transition of [facility]) CM/SW Contact:    Liliana Cline, LCSW Phone Number: 06/08/2023, 3:24 PM  Clinical Narrative:                 Spoke with son [name]. Patient is from [facility] long term [facility], she has been there about a year and started as short term rehab. Patient has a wheelchair and rolling walker at the facility.  Patient goes to dialysis at [facility] on Tuesday, Thursday, Saturday.  [name] states his plan is for her to return to [facility] when ready.  [name] had question about PCP resources, he would like a new one for her - PCP resources provided. Informed [name] that per Tammy at [facility], if they choose to use a PCP outside of the facility, it would have to be private pay. [name] had questions about medical bills he has received from doctors visits patient has had, states he is not sure if insurance/[facility] is supposed to cover these - asked Tammy who states the insurance should have covered these and she will have Judeth Cornfield in [place] follow up with [name] about this tomorrow (Monday).   Expected Discharge Plan: Long Term Nursing Home Barriers to Discharge: Continued Medical Work up   Patient Goals and CMS Choice Patient states their goals for this hospitalization and ongoing recovery are:: back to [facility] CMS Medicare.gov Compare Post Acute Care list provided to:: Patient Represenative (must comment) Choice offered to / list presented to : Adult Children      Expected Discharge Plan and Services       Living arrangements for the past 2 months: Skilled Nursing Facility                                      Prior Living Arrangements/Services Living arrangements for the past 2 months: Skilled Nursing Facility Lives with:: Facility Resident Patient language and need for interpreter reviewed:: Yes Do you feel  safe going back to the place where you live?: Yes      Need for Family Participation in Patient Care: Yes (Comment) Care giver support system in place?: Yes (comment) Current home services: DME Criminal Activity/Legal Involvement Pertinent to Current Situation/Hospitalization: No - Comment as needed  Activities of Daily Living Home Assistive Devices/Equipment: Eyeglasses, Dentures (specify type), Oxygen, Walker (specify type) ADL Screening (condition at time of admission) Patient's cognitive ability adequate to safely complete daily activities?: Yes Is the patient deaf or have difficulty hearing?: No Does the patient have difficulty seeing, even when wearing glasses/contacts?: Yes Does the patient have difficulty concentrating, remembering, or making decisions?: No Patient able to express need for assistance with ADLs?: Yes Does the patient have difficulty dressing or bathing?: No Independently performs ADLs?: Yes (appropriate for developmental age) Does the patient have difficulty walking or climbing stairs?: Yes Weakness of Legs: Both Weakness of Arms/Hands: None  Permission Sought/Granted Permission sought to share information with : Facility Industrial/product designer granted to share information with : Yes, Verbal Permission Granted (by son [name])     Permission granted to share info w AGENCY: [facility]        Emotional Assessment         Alcohol / Substance Use: Not Applicable Psych Involvement: No (comment)  Admission diagnosis:  Bradycardia [R00.1] Symptomatic bradycardia [R00.1] Hypotension, unspecified hypotension type [I95.9] Patient Active Problem List   Diagnosis Date Noted   Symptomatic bradycardia 06/07/2023   Hyperkalemia 09/30/2022   Constipation 09/30/2022   AMS (altered mental status) 09/28/2022   Hypotension    Atrial fibrillation with slow ventricular response (HCC)    Pressure injury of skin 05/22/2022   General weakness 05/22/2022   Weakness  05/21/2022   Anaphylactic shock, unspecified, initial encounter 06/29/2020   Sepsis (HCC) 06/22/2019   Pain due to onychomycosis of toenails of both feet 06/11/2019   Callus 06/11/2019   Other disorders of phosphorus metabolism 05/20/2019   Mixed hyperlipidemia 03/22/2019   Otalgia of both ears 03/22/2019   Right arm pain 03/22/2019   Bradycardia 10/01/2017   Dyslipidemia 10/01/2017   Dependence on renal dialysis (HCC) 09/01/2017   Chest pain, unspecified 12/06/2016   Non-ST elevation (NSTEMI) myocardial infarction (HCC) 12/06/2016   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   History of NSTEMI    CAD (coronary artery disease) 12/03/2016   Hypokalemia 05/03/2016   Pruritus, unspecified 03/27/2015   Encounter for removal of sutures 11/30/2014   Pain in lower limb 08/26/2014   Encounter for immunization 06/10/2014   Unspecified protein-calorie malnutrition (HCC) 02/28/2014   Allergy, unspecified, initial encounter 02/15/2014   HCAP (healthcare-associated pneumonia) 02/13/2014   Absence of both cervix and uterus, acquired 02/01/2014   Coagulation defect, unspecified (HCC) 02/01/2014   Dyspnea, unspecified 02/01/2014   Family history of diabetes mellitus 02/01/2014   Family history of ischemic heart disease and other diseases of the circulatory system 02/01/2014   Fever, unspecified 02/01/2014   Headache, unspecified 02/01/2014   History of allergy to sulfonamides 02/01/2014   Iron deficiency anemia, unspecified 02/01/2014   Long term (current) use of insulin (HCC) 02/01/2014   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   OSA (obstructive sleep apnea) 01/21/2014   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HCC) 01/21/2014   Acute encephalopathy 01/19/2014   Congestive heart failure (HCC) 01/15/2014   ESRD on dialysis (HCC) 01/15/2014   Hypertension 01/15/2014   Diabetes mellitus (HCC) 01/15/2014   Anemia 01/15/2014   Onychomycosis 03/26/2013   Pain in joint, ankle and foot  03/26/2013   PCP:  Macky Lower, MD Pharmacy:   [facility]) - [place], [place] - [address] [facility] [address] [place] [address] [place] [address] Phone: [phone] Fax: [phone]  [facility] [address], [place] - [address] [place] [address] Phone: [phone] Fax: [phone]  [facility]. [place], [place] - [address] [place] [address] Phone: [phone] Fax: [phone]     Social Determinants of Health (SDOH) Social History: SDOH Screenings   Food Insecurity: No Food Insecurity (06/07/2023)  Housing: Low Risk  (06/07/2023)  Transportation Needs: No Transportation Needs (06/07/2023)  Utilities: Not At Risk (06/07/2023)  Depression (PHQ2-9): Low Risk  (06/27/2021)  Financial Resource Strain: Medium Risk (06/27/2021)  Physical Activity: Inactive (06/27/2021)  Stress: No Stress Concern Present (06/27/2021)  Tobacco Use: Medium Risk (06/08/2023)   SDOH Interventions:     Readmission Risk Interventions    05/22/2022   12:26 PM  Readmission Risk Prevention Plan  Transportation Screening Complete  PCP or Specialist Appt within 3-5 Days Complete  HRI or Home Care Consult Complete  Social Work Consult for Recovery Care Planning/Counseling Complete  Palliative Care Screening Complete  Medication Review Oceanographer) Complete

## 2023-06-08 NOTE — Progress Notes (Signed)
*  PRELIMINARY RESULTS* Echocardiogram 2D Echocardiogram has been performed.  Jacqueline [name] 06/08/2023, 11:49 AM

## 2023-06-08 NOTE — TOC CM/SW Note (Signed)
Patient/son Jacqueline [name] at bedside defer to other son Jacqueline [name] for high risk assessment. Left VM for Jacqueline [name] requesting a return call.  Alfonso Ramus, LCSW Transitions of Care Department [phone]

## 2023-06-08 NOTE — TOC CM/SW Note (Signed)
CSW attempted to meet with patient bedside, Staff was in the patient's room for imaging. Will attempt later.  Layla Barter, LCSWA TOC-Weekends [phone]

## 2023-06-09 ENCOUNTER — Inpatient Hospital Stay: Admit: 2023-06-09 | Payer: Medicare Other | Admitting: Internal Medicine

## 2023-06-09 ENCOUNTER — Encounter (HOSPITAL_COMMUNITY): Payer: Self-pay

## 2023-06-09 DIAGNOSIS — N186 End stage renal disease: Secondary | ICD-10-CM

## 2023-06-09 DIAGNOSIS — Z992 Dependence on renal dialysis: Secondary | ICD-10-CM

## 2023-06-09 DIAGNOSIS — Z95828 Presence of other vascular implants and grafts: Secondary | ICD-10-CM

## 2023-06-09 DIAGNOSIS — I959 Hypotension, unspecified: Secondary | ICD-10-CM | POA: Diagnosis not present

## 2023-06-09 DIAGNOSIS — R001 Bradycardia, unspecified: Secondary | ICD-10-CM | POA: Diagnosis not present

## 2023-06-09 DIAGNOSIS — R579 Shock, unspecified: Secondary | ICD-10-CM | POA: Diagnosis not present

## 2023-06-09 DIAGNOSIS — I70208 Unspecified atherosclerosis of native arteries of extremities, other extremity: Secondary | ICD-10-CM

## 2023-06-09 LAB — HEPATITIS B SURFACE ANTIGEN: Hepatitis B Surface Ag: NONREACTIVE

## 2023-06-09 LAB — GLUCOSE, CAPILLARY
Glucose-Capillary: 202 mg/dL — ABNORMAL HIGH (ref 70–99)
Glucose-Capillary: 179 mg/dL — ABNORMAL HIGH (ref 70–99)
Glucose-Capillary: 114 mg/dL — ABNORMAL HIGH (ref 70–99)
Glucose-Capillary: 190 mg/dL — ABNORMAL HIGH (ref 70–99)
Glucose-Capillary: 139 mg/dL — ABNORMAL HIGH (ref 70–99)
Glucose-Capillary: 94 mg/dL (ref 70–99)

## 2023-06-09 LAB — BASIC METABOLIC PANEL
Anion gap: 17 — ABNORMAL HIGH (ref 5–15)
BUN: 72 mg/dL — ABNORMAL HIGH (ref 8–23)
CO2: 23 mmol/L (ref 22–32)
Calcium: 7.2 mg/dL — ABNORMAL LOW (ref 8.9–10.3)
Chloride: 91 mmol/L — ABNORMAL LOW (ref 98–111)
Creatinine, Ser: 9.89 mg/dL — ABNORMAL HIGH (ref 0.44–1.00)
GFR, Estimated: 4 mL/min — ABNORMAL LOW (ref >60)
Glucose, Bld: 219 mg/dL — ABNORMAL HIGH (ref 70–99)
Potassium: 3.8 mmol/L (ref 3.5–5.1)
Sodium: 131 mmol/L — ABNORMAL LOW (ref 135–145)

## 2023-06-09 LAB — PHOSPHORUS: Phosphorus: 6 mg/dL — ABNORMAL HIGH (ref 2.5–4.6)

## 2023-06-09 LAB — CBC
HCT: 33.8 % — ABNORMAL LOW (ref 36.0–46.0)
Hemoglobin: 10.7 g/dL — ABNORMAL LOW (ref 12.0–15.0)
MCH: 30.7 pg (ref 26.0–34.0)
MCHC: 31.7 g/dL (ref 30.0–36.0)
MCV: 97.1 fL (ref 80.0–100.0)
Platelets: 120 10*3/uL — ABNORMAL LOW (ref 150–400)
RBC: 3.48 MIL/uL — ABNORMAL LOW (ref 3.87–5.11)
RDW: 15.3 % (ref 11.5–15.5)
WBC: 11.2 10*3/uL — ABNORMAL HIGH (ref 4.0–10.5)
nRBC: 0 % (ref 0.0–0.2)

## 2023-06-09 LAB — CORTISOL
Cortisol, Plasma: 6.9 ug/dL
Cortisol, Plasma: 11.4 ug/dL

## 2023-06-09 LAB — CULTURE, BLOOD (ROUTINE X 2): Special Requests: ADEQUATE

## 2023-06-09 LAB — MAGNESIUM: Magnesium: 1.8 mg/dL (ref 1.7–2.4)

## 2023-06-09 LAB — LACTIC ACID, PLASMA
Lactic Acid, Venous: 0.7 mmol/L (ref 0.5–1.9)
Lactic Acid, Venous: 0.6 mmol/L (ref 0.5–1.9)

## 2023-06-09 MED ORDER — NEPRO/CARBSTEADY PO LIQD: 237.0000 mL | Freq: Three times a day (TID) | ORAL | 0 refills | Status: DC

## 2023-06-09 MED ORDER — HEPARIN (PORCINE) 25000 UT/250ML-% IV SOLN
1800.0000 [IU]/h | INTRAVENOUS | Status: DC
  Administered 2023-06-09 – 2023-06-10 (×2): 1800 [IU]/h via INTRAVENOUS
  Filled 2023-06-09 (×2): qty 250

## 2023-06-09 MED ORDER — LIDOCAINE-PRILOCAINE 2.5-2.5 % EX CREA: 1.0000 | TOPICAL_CREAM | CUTANEOUS | 0 refills | Status: DC | PRN

## 2023-06-09 MED ORDER — LIDOCAINE HCL (PF) 1 % IJ SOLN: 5.0000 mL | INTRAMUSCULAR | 0 refills | Status: DC | PRN

## 2023-06-09 MED ORDER — NOREPINEPHRINE 4 MG/250ML-% IV SOLN: 2.0000 ug/min | INTRAVENOUS | Status: DC

## 2023-06-09 MED ORDER — RENA-VITE PO TABS
1.0000 | ORAL_TABLET | Freq: Every day | ORAL | Status: DC
  Administered 2023-06-09 – 2023-06-30 (×22): 1 via ORAL
  Filled 2023-06-09 (×22): qty 1

## 2023-06-09 MED ORDER — ONDANSETRON HCL 4 MG/2ML IJ SOLN: 4.0000 mg | Freq: Four times a day (QID) | INTRAMUSCULAR | 0 refills | Status: DC | PRN

## 2023-06-09 MED ORDER — DOCUSATE SODIUM 100 MG PO CAPS: 100.0000 mg | ORAL_CAPSULE | Freq: Two times a day (BID) | ORAL | 0 refills | Status: DC | PRN

## 2023-06-09 MED ORDER — RENA-VITE PO TABS: 1.0000 | ORAL_TABLET | Freq: Every day | ORAL | 0 refills | Status: DC

## 2023-06-09 MED ORDER — PENTAFLUOROPROP-TETRAFLUOROETH EX AERO: 1.0000 | INHALATION_SPRAY | CUTANEOUS | 0 refills | Status: DC | PRN

## 2023-06-09 MED ORDER — HEPARIN SODIUM (PORCINE) 1000 UNIT/ML DIALYSIS: 1000.0000 [IU] | INTRAMUSCULAR | Status: DC | PRN

## 2023-06-09 MED ORDER — ALTEPLASE 2 MG IJ SOLR: 2.0000 mg | Freq: Once | INTRAMUSCULAR | Status: DC | PRN

## 2023-06-09 MED ORDER — SODIUM CHLORIDE 0.9 % IV SOLN: 250.0000 mL | INTRAVENOUS | 0 refills | Status: DC

## 2023-06-09 MED ORDER — FERRIC CITRATE 1 GM 210 MG(FE) PO TABS: 420.0000 mg | ORAL_TABLET | Freq: Three times a day (TID) | ORAL | Status: DC

## 2023-06-09 MED ORDER — NEPRO/CARBSTEADY PO LIQD
237.0000 mL | Freq: Three times a day (TID) | ORAL | Status: DC
  Administered 2023-06-09 – 2023-07-01 (×33): 237 mL via ORAL

## 2023-06-09 MED ORDER — CINACALCET HCL 30 MG PO TABS: 30.0000 mg | ORAL_TABLET | Freq: Every day | ORAL | Status: DC

## 2023-06-09 MED ORDER — MIDODRINE HCL 10 MG PO TABS: 10.0000 mg | ORAL_TABLET | Freq: Three times a day (TID) | ORAL | Status: DC

## 2023-06-09 MED ORDER — BISACODYL 5 MG PO TBEC: 10.0000 mg | DELAYED_RELEASE_TABLET | Freq: Every day | ORAL | 0 refills | Status: DC | PRN

## 2023-06-09 MED ORDER — LORATADINE 10 MG PO TABS: 10.0000 mg | ORAL_TABLET | Freq: Every day | ORAL | Status: DC

## 2023-06-09 MED ORDER — MIDODRINE HCL 5 MG PO TABS
10.0000 mg | ORAL_TABLET | Freq: Three times a day (TID) | ORAL | Status: DC
  Administered 2023-06-09 – 2023-06-12 (×11): 10 mg via ORAL
  Filled 2023-06-09 (×11): qty 2

## 2023-06-09 MED ORDER — FLUDROCORTISONE ACETATE 0.1 MG PO TABS: 0.1000 mg | ORAL_TABLET | Freq: Every day | ORAL | Status: DC

## 2023-06-09 MED ORDER — PREGABALIN 100 MG PO CAPS: 100.0000 mg | ORAL_CAPSULE | Freq: Every day | ORAL | Status: DC

## 2023-06-09 MED ORDER — HEPARIN BOLUS VIA INFUSION
4500.0000 [IU] | Freq: Once | INTRAVENOUS | Status: AC
  Administered 2023-06-09: 4500 [IU] via INTRAVENOUS
  Filled 2023-06-09: qty 4500

## 2023-06-09 MED ORDER — ANTICOAGULANT SODIUM CITRATE 4% (200MG/5ML) IV SOLN: 5.0000 mL | Status: DC | PRN

## 2023-06-09 MED ORDER — POLYETHYLENE GLYCOL 3350 17 G PO PACK: 17.0000 g | PACK | Freq: Every day | ORAL | 0 refills | Status: DC | PRN

## 2023-06-09 MED ORDER — CHLORHEXIDINE GLUCONATE CLOTH 2 % EX PADS: 6.0000 | MEDICATED_PAD | Freq: Every day | CUTANEOUS | Status: DC

## 2023-06-09 MED ORDER — INSULIN ASPART 100 UNIT/ML IJ SOLN: 0.0000 [IU] | INTRAMUSCULAR | 11 refills | Status: DC

## 2023-06-09 NOTE — Discharge Summary (Signed)
Physician Discharge Summary  Patient ID: Jacqueline [name] MRN: [id number] DOB/AGE: 06-Feb-1946 77 y.o.  Admit date: 06/07/2023 Discharge date: 06/09/2023   Brief Pt Description / Synopsis:  77 y.o female with PMHx most significant for chronic hypotension (SBP in 80's on Midodrine 10 mg TID) and ESRD on HD (TTS) admitted with Cardiogenic shock due to Bradycardia. EP requesting transfer to [facility] for Leadless Pacemaker placement.   Discharge Diagnoses:   Circulatory Shock: Suspect Cardiogenic given Bradycardia ~ IMPROVED Afib with slow ventricular rate ~ RATE IMPROVED Concern for Sepsis of unknown etiology ~ LOW SUSPICION FOR SEPSIS  ESRD on HD  Hyponatremia AG Metabolic Acidosis Mild Thrombocytopenia  Type II Diabetes Mellitus                                                      Discharge Summary:  77 y.o female with significant PMH of HTN, T2DM,ESRD on HD TTS, Chronic debility , CAD s/p stent, PAF,  OSA, PAD, Left knee arthritis, Chronic lower back pain,GERD,Obesity , HLD  who presented to the ED  from Dialysis with chief complaints of generalized weakness x 4 days.   EMS report: NVD and LLQ abdominal pain since last night. States tried to eat yesterday, each time she ate she vomited, then had diarrhea last night. No NVD today. She went to Dialysis today and was noted with low BP of 72/42. She usually runs lows BP and takes Midodrine but today her BP was lower than usually so she was not dialyzed but was sent to ED for further evaluation.   ED Course:  Initial vital signs showed HR of 50 beats/minute, BP 81/51 mm Hg, the RR 16 breaths/minute, and the oxygen saturation 91% on 2L and a temperature of 97.12F (36.6C).   Pertinent Labs/Diagnostics.   Na+/ K+: 133/3.4 Glucose:130 BUN/Cr.:55/7.76 WBC:11.5 Plts: 146 PCT: pending Lactic acid: 1.3 EKG:?Afib vs junctional bradycardia rate 40bmp. Incomplete RBB,   CXR, CT scan obtained and show no signs of infection or other abnormality.  Patient given, midodrine, atropine for significant bradycardia and  500 cc of fluids and started on broad-spectrum antibiotics Vanco cefepime and Flagyl for  suspected sepsis of unknown source. Patient remained hypotensive despite  intervention as above. PCCM consulted for possible pressors.   Discharge Plan by Diagnosis:  #Circulatory Shock: Suspect Cardiogenic given Bradycardia ~ IMPROVED #Afib with slow ventricular rate ~ RATE IMPROVED PMHx: CAD, HLD, permanent A. Fib, chronic hypotension with SBP in 80's on Midodrine 10 mg TID Echocardiogram 06/08/23: LVEF 60-65%, indeterminate diastolic parameters, RV systolic function mildly reduced, severe pulmonary hypertension, moderate tricuspid regurgitation -Continuous cardiac monitoring -Maintain MAP >65 -Vasopressors as needed to maintain MAP goal -Trend lactic acid until normalized -Resume outpatient Midodrine -TSH normal at 1.49 -Cortisol is 6.9 ~ Continue Fludrocortisone -Cardiology following, appreciate input ~ EP evaluated and recommends transfer to [facility] for Leadless Pacemaker placement -Continue outpatient Eliquis for anticoagulation for a.fib   #Concern for Sepsis of unknown etiology ~ LOW SUSPICION FOR SEPSIS No clear sign of sepsis though patient is hypotensive. Blood cultures are drawn and are negative to date.  CXR without sign of pneumonia, unable to make urine -Monitor fever curve -Trend WBC's & Procalcitonin -Follow cultures as above -Discussed with Dr. Belia Heman, will monitor off ABX 7/8 pending cultures & sensitivities, with low threshold to resume ABX   #  ESRD on HD  #Hyponatremia #AG Metabolic Acidosis -Monitor I&O's / urinary output -Follow BMP -Ensure adequate renal perfusion -Avoid nephrotoxic agents as able -Replace electrolytes as indicated -Nephrology following, appreciate input ~ HD vs CRRT as per Nephrology   #Mild Thrombocytopenia -Monitor for S/Sx of bleeding -Trend CBC -Continue Eliquis for  Anticoagulation/VTE Prophylaxis  -Transfuse for Hgb <7   #T2DM  -CBG's q4h; Target range of 140 to 180 -SSI -Follow ICU Hypo/Hyperglycemia protocol     Significant Events:                 06/07/2023: admit to ICU with bradycardia, started on dopamine 06/08/2023: weaned off dopamine, hypotensive again, dopamine restarted 06/09/2023:  Remains hypotensive requiring Levophed, (pt is vasculopath, doubt accuracy of cuff BP as pt is awake, alert, and asymptomatic).  Attempted Arterial line but unsuccessful, Vascular Surgery consulted to assist with placement. Nephrology consulted for renal replacement therapy. EP recommends transfer to [facility] for leadless pacemaker placement.  Significant Diagnostic Studies:  06/07/23: CT Abdomen & Pelvis w/ contrast>>IMPRESSION: 1. Stable exam. No acute findings in the abdomen or pelvis. Specifically, no findings to explain the patient's history of abdominal pain. 2. Bilateral renal atrophy with multiple simple cysts again noted. No followup imaging is recommended. 3.  Aortic Atherosclerosis (ICD10-I70.0). 06/07/23: Chest X-ray>>IMPRESSION: No acute chest findings. 06/08/23: Echocardiogram>>IMPRESSIONS  1. Left ventricular ejection fraction, by estimation, is 60 to 65%. The  left ventricle has normal function. The left ventricle has no regional  wall motion abnormalities. Left ventricular diastolic parameters are  indeterminate.   2. Right ventricular systolic function is moderately reduced. The right  ventricular size is moderately enlarged. There is severely elevated  pulmonary artery systolic pressure. The estimated right ventricular  systolic pressure is 79.0 mmHg.   3. Left atrial size was mildly dilated.   4. The mitral valve is normal in structure. Mild mitral valve  regurgitation. No evidence of mitral stenosis.   5. Tricuspid valve regurgitation is moderate.   6. The aortic valve is normal in structure. Aortic valve regurgitation is  not  visualized. Aortic valve sclerosis is present, with no evidence of  aortic valve stenosis.   7. The inferior vena cava is dilated in size with <50% respiratory  variability, suggesting right atrial pressure of 15 mmHg.             Micro Data:  7/6: Blood culture x2>> no growth to date 7/6: MRSA PCR>>negative  Antimicrobials:   Anti-infectives (From admission, onward)    Start     Dose/Rate Route Frequency Ordered Stop   06/10/23 1200  vancomycin (VANCOCIN) IVPB 1000 mg/200 mL premix  Status:  Discontinued       See Hyperspace for full Linked Orders Report.   1,000 mg 200 mL/hr over 60 Minutes Intravenous Every T-Th-Sa (Hemodialysis) 06/08/23 1058 06/09/23 1029   06/08/23 1145  vancomycin (VANCOCIN) IVPB 1000 mg/200 mL premix       See Hyperspace for full Linked Orders Report.   1,000 mg 200 mL/hr over 60 Minutes Intravenous  Once 06/08/23 1058 06/08/23 1655   06/08/23 1100  piperacillin-tazobactam (ZOSYN) IVPB 2.25 g  Status:  Discontinued        2.25 g 100 mL/hr over 30 Minutes Intravenous Every 8 hours 06/08/23 1058 06/09/23 1029   06/07/23 1400  metroNIDAZOLE (FLAGYL) IVPB 500 mg        500 mg 100 mL/hr over 60 Minutes Intravenous  Once 06/07/23 1346 06/07/23 1652   06/07/23 1400  ceFEPIme (MAXIPIME) 2 g in sodium chloride 0.9 % 100 mL IVPB        2 g 200 mL/hr over 30 Minutes Intravenous  Once 06/07/23 1349 06/07/23 1512   06/07/23 1400  vancomycin (VANCOCIN) IVPB 1000 mg/200 mL premix        1,000 mg 200 mL/hr over 60 Minutes Intravenous  Once 06/07/23 1349 06/07/23 1652        Consults:  Cardiology  EP Nephrology  Discharge Exam:   Examination: General: Acute on chronically ill-appearing obese female, laying in bed, on 2 L nasal cannula, no acute distress HENT: Atraumatic, normocephalic, neck supple, +JVD Lungs: Distant breath sounds due to body habitus, even, nonlabored Cardiovascular: Irregular irregular rhythm, rate controlled, no murmurs, rubs,  gallops Abdomen: Obese, soft, nontender, no guarding or rebound tenderness, bowel sounds positive x 4 Extremities: Normal bulk and tone, no deformities, trace edema bilateral lower extremities Neuro: Sleeping, arouses easily to voice, or oriented x 3, moves all extremities to command, no focal deficits, speech clear GU: Deferred  Vitals:   06/09/23 1200 06/09/23 1215 06/09/23 1230 06/09/23 1300  BP: (!) 83/74 (!) 95/33 (!) 59/50 (!) 109/98  Pulse: 69 72 72 79  Resp: 14 17 16 17   Temp: 98.6 F (37 C)     TempSrc: Oral     SpO2: 94% 95% (!) 87% 98%  Weight:      Height:         Discharge Labs:   BMET Recent Labs  Lab 06/07/23 1254 06/08/23 0726 06/09/23 0350  NA 133* 133* 131*  K 3.4* 4.1 3.8  CL 92* 92* 91*  CO2 27 25 23   GLUCOSE 130* 162* 219*  BUN 55* 62* 72*  CREATININE 7.76* 8.41* 9.89*  CALCIUM 7.7* 7.7* 7.2*  MG  --  2.0 1.8  PHOS  --  5.3* 6.0*    CBC Recent Labs  Lab 06/07/23 1254 06/08/23 0726  HGB 12.4 12.6  HCT 41.0 40.2  WBC 11.5* 12.4*  PLT 146* 138*    Anti-Coagulation No results for input(s): "INR" in the last 168 hours.        Allergies as of 06/09/2023       Reactions   Other Other (See Comments)   Sulfa Antibiotics Other (See Comments)   Sulfa Drugs Cross Reactors Other (See Comments)   Doesn't remember         Medication List     STOP taking these medications    acetaminophen 650 MG CR tablet Commonly known as: TYLENOL   albuterol 108 (90 Base) MCG/ACT inhaler Commonly known as: VENTOLIN HFA   apixaban 5 MG Tabs tablet Commonly known as: ELIQUIS   Dialyvite/Zinc Tabs   ezetimibe 10 MG tablet Commonly known as: ZETIA   fexofenadine 180 MG tablet Commonly known as: ALLEGRA Replaced by: loratadine 10 MG tablet   Fish Oil 1000 MG Cpdr   omeprazole 20 MG capsule Commonly known as: PRILOSEC   ondansetron 4 MG disintegrating tablet Commonly known as: ZOFRAN-ODT   rosuvastatin 10 MG tablet Commonly known  as: CRESTOR   sennosides-docusate sodium 8.6-50 MG tablet Commonly known as: SENOKOT-S   trolamine salicylate 10 % cream Commonly known as: ASPERCREME       TAKE these medications    alteplase 2 MG injection Commonly known as: CATHFLO ACTIVASE 2 mg by Intracatheter route once as needed for open catheter.   anticoagulant sodium citrate 4 GM/100ML Soln 5 mLs by Intracatheter route as needed (in dialysis, If  TTP or HIT.).   bisacodyl 5 MG EC tablet Commonly known as: DULCOLAX Take 2 tablets (10 mg total) by mouth daily as needed for moderate constipation.   Chlorhexidine Gluconate Cloth 2 % Pads Apply 6 each topically daily. Start taking on: June 10, 2023   cinacalcet 30 MG tablet Commonly known as: SENSIPAR Take 1 tablet (30 mg total) by mouth daily with supper.   docusate sodium 100 MG capsule Commonly known as: COLACE Take 1 capsule (100 mg total) by mouth 2 (two) times daily as needed for mild constipation.   feeding supplement (NEPRO CARB STEADY) Liqd Take 237 mLs by mouth 3 (three) times daily between meals.   ferric citrate 1 GM 210 MG(Fe) tablet Commonly known as: AURYXIA Take 2 tablets (420 mg total) by mouth 3 (three) times daily with meals.   fludrocortisone 0.1 MG tablet Commonly known as: FLORINEF Take 1 tablet (0.1 mg total) by mouth daily. Start taking on: June 10, 2023   heparin 1000 unit/mL Soln injection 1 mL (1,000 Units total) by Intracatheter route as needed (in dialysis).   insulin aspart 100 UNIT/ML injection Commonly known as: novoLOG Inject 0-6 Units into the skin every 4 (four) hours.   lidocaine (PF) 1 % Soln injection Commonly known as: XYLOCAINE Inject 5 mLs into the skin as needed (topical anesthesia for hemodialysis if GEBAUERS is ineffective.).   lidocaine-prilocaine cream Commonly known as: EMLA Apply 1 Application topically as needed (topical anesthesia for hemodialysis if Gebauers and Lidocaine injection are ineffective.).    loratadine 10 MG tablet Commonly known as: CLARITIN Take 1 tablet (10 mg total) by mouth daily. Start taking on: June 10, 2023 Replaces: fexofenadine 180 MG tablet   midodrine 10 MG tablet Commonly known as: PROAMATINE Take 1 tablet (10 mg total) by mouth 3 (three) times daily with meals. What changed:  how much to take when to take this additional instructions Another medication with the same name was removed. Continue taking this medication, and follow the directions you see here.   multivitamin Tabs tablet Take 1 tablet by mouth at bedtime.   norepinephrine 4-5 MG/250ML-% Soln Commonly known as: LEVOPHED Inject 2-10 mcg/min into the vein continuous.   ondansetron 4 MG/2ML Soln injection Commonly known as: ZOFRAN Inject 2 mLs (4 mg total) into the vein every 6 (six) hours as needed for vomiting or nausea.   pentafluoroprop-tetrafluoroeth Aero Commonly known as: GEBAUERS Apply 1 Application topically as needed (topical anesthesia for hemodialysis).   polyethylene glycol 17 g packet Commonly known as: MIRALAX / GLYCOLAX Take 17 g by mouth daily as needed for moderate constipation. What changed:  when to take this reasons to take this   pregabalin 100 MG capsule Commonly known as: LYRICA Take 1 capsule (100 mg total) by mouth at bedtime. What changed: Another medication with the same name was removed. Continue taking this medication, and follow the directions you see here.   sodium chloride 0.9 % infusion Inject 250 mLs into the vein continuous.            Disposition: ICU  Discharged Condition: Jacqueline [name] has met maximum benefit of inpatient care at [facility] and requires transfer to [facility] for Leadless Pacemaker placement.     Time spent on disposition:  40 Minutes.     Signed: Harlon Ditty, AGACNP-BC  Pulmonary & Critical Care Prefer epic messenger for cross cover needs If after hours, please call E-link

## 2023-06-09 NOTE — Op Note (Signed)
[facility]     OPERATIVE NOTE   DATE: 06/09/2023   PRE-OPERATIVE DIAGNOSIS: Hypotension requiring pressors, bradycardia, ESRD   POST-OPERATIVE DIAGNOSIS: Same as above   PROCEDURE: 1.   Right radial arterial line placement with ultrasound guidance   SURGEON: Festus Barren   ASSISTANT(S): None   ANESTHESIA: None   ESTIMATED BLOOD LOSS: 5 cc   FINDING(S): 1.  none   SPECIMEN(S):  None   INDICATIONS:   Patient is a 77 y.o.female who presents with hypotension requiring pressors and inability to accurately monitor her blood pressure.  She is a dialysis patient and the left arm has a access in place already which makes the soft limit for blood pressure cuff or arterial line monitoring.  The critical care service has attempted a right radial arterial line without success and we are asked to place this. We are placing the arterial line for invasive hemodynamic monitoring and arterial access for blood draws and ABGs.   DESCRIPTION: The patient's right wrist was prepped and draped. The right radial artery was visualized with ultrasound. This was found to be patent but calcified. It was then accessed with minimal difficulty with a micropuncture needle and a permanent image was recorded. A micropuncture wire was then placed. A 5 French micropuncture sheath was then placed over the wire and the wire and dilator were removed. This was secured and connected to the appropriate monitors where a brisk arterial tracing was seen. The patient tolerated the procedure well without complications.     COMPLICATIONS: None   CONDITION: Stable   Festus Barren  06/09/2023 2:09 PM  This note was created with Dragon Medical transcription system. Any errors in dictation are purely unintentional.

## 2023-06-09 NOTE — Progress Notes (Signed)
Pt resting in bed in NAD with family at bedside. Pt is receiving HD with dialysis nurse at bedside. Pt remains on 2L [place], Levo @ 2 and Heparin @ 1800 per EMAR. No c/o pain at this time. Vital signs stable. Call bell and all belongings within reach.

## 2023-06-09 NOTE — Consult Note (Signed)
[facility] Vascular Consult Note  MRN : [id number]  Jacqueline [name] is a 77 y.o. (July 30, 1946) female who presents with chief complaint of  Chief Complaint  Patient presents with   Weakness  .  History of Present Illness: I am asked to see the patient by Dr. Belia Heman for placement of an arterial line for monitoring of blood pressure.  Patient has symptomatic bradycardia and has required pressors and there is work on transferring for a pacemaker.  She was requiring 2 pressors previously and is currently still on a Levophed drip.  Her blood pressure has been incredibly unreliable by her cough.  She is a dialysis patient with multiple medical issues.  She is still using her left upper arm access for dialysis.  This precluded placement of an arterial line on the left.  Attempts by the critical care service of placing a right radial arterial line have been unsuccessful due to her severely calcified vessels  Current Facility-Administered Medications  Medication Dose Route Frequency Provider Last Rate Last Admin   0.9 %  sodium chloride infusion  250 mL Intravenous Continuous Sharen Hones, RPH 10 mL/hr at 06/09/23 1306 Infusion Verify at 06/09/23 1306   alteplase (CATHFLO ACTIVASE) injection 2 mg  2 mg Intracatheter Once PRN Wendee Beavers, NP       anticoagulant sodium citrate solution 5 mL  5 mL Intracatheter PRN Wendee Beavers, NP       apixaban (ELIQUIS) tablet 5 mg  5 mg Oral BID Jimmye Norman, NP   5 mg at 06/08/23 2118   bisacodyl (DULCOLAX) EC tablet 10 mg  10 mg Oral Daily PRN Jimmye Norman, NP       Chlorhexidine Gluconate Cloth 2 % PADS 6 each  6 each Topical Daily Jimmye Norman, NP   6 each at 06/09/23 0850   cinacalcet (SENSIPAR) tablet 30 mg  30 mg Oral Q supper Jimmye Norman, NP   30 mg at 06/08/23 1609   docusate sodium (COLACE) capsule 100 mg  100 mg Oral BID PRN Jimmye Norman, NP       ezetimibe  (ZETIA) tablet 10 mg  10 mg Oral Daily Jimmye Norman, NP   10 mg at 06/09/23 0845   feeding supplement (NEPRO CARB STEADY) liquid 237 mL  237 mL Oral TID BM Erin Fulling, MD       ferric citrate (AURYXIA) tablet 420 mg  420 mg Oral TID WC Jimmye Norman, NP   420 mg at 06/09/23 1256   fludrocortisone (FLORINEF) tablet 0.1 mg  0.1 mg Oral Daily Jimmye Norman, NP   0.1 mg at 06/09/23 0845   heparin injection 1,000 Units  1,000 Units Intracatheter PRN Wendee Beavers, NP       insulin aspart (novoLOG) injection 0-6 Units  0-6 Units Subcutaneous Q4H Jimmye Norman, NP   1 Units at 06/09/23 0845   lidocaine (PF) (XYLOCAINE) 1 % injection 5 mL  5 mL Intradermal PRN Wendee Beavers, NP       lidocaine-prilocaine (EMLA) cream 1 Application  1 Application Topical PRN Wendee Beavers, NP       loratadine (CLARITIN) tablet 10 mg  10 mg Oral Daily Jimmye Norman, NP   10 mg at 06/09/23 0846   midodrine (PROAMATINE) tablet 10 mg  10 mg Oral TID WC Harlon Ditty D, NP   10 mg at 06/09/23 1256   multivitamin (RENA-VIT) tablet 1 tablet  1 tablet  Oral QHS Erin Fulling, MD       norepinephrine (LEVOPHED) 4mg  in (0.016 mg/mL) premix infusion  2-10 mcg/min Intravenous Titrated Sharen Hones, RPH 22.5 mL/hr at 06/09/23 1306 6 mcg/min at 06/09/23 1306   ondansetron (ZOFRAN) injection 4 mg  4 mg Intravenous Q6H PRN Jimmye Norman, NP   4 mg at 06/08/23 1606   pentafluoroprop-tetrafluoroeth (GEBAUERS) aerosol 1 Application  1 Application Topical PRN Wendee Beavers, NP       polyethylene glycol (MIRALAX / GLYCOLAX) packet 17 g  17 g Oral Daily PRN Jimmye Norman, NP       pregabalin (LYRICA) capsule 100 mg  100 mg Oral QHS Jimmye Norman, NP   100 mg at 06/08/23 0257   pregabalin (LYRICA) capsule 50 mg  50 mg Oral BID Jimmye Norman, NP   50 mg at 06/09/23 0846   rosuvastatin (CRESTOR) tablet 10 mg  10 mg Oral Daily Jimmye Norman, NP   10 mg at 06/09/23 0845   senna-docusate (Senokot-S) tablet 1 tablet  1 tablet Oral BID Jimmye Norman, NP   1 tablet at 06/09/23 0845    Past Medical History:  Diagnosis Date   Arthritis    "left knee" (12/04/2016)   CAD (coronary artery disease)    a. 12/2016 NSTEMI/PCI: LM nl, LaD 20p/d, D1 20, OM2 20, OM3 20, RCA 33m (3.0x18 Resolute DES). EF 65%.   Chronic lower back pain    Diverticulosis    on CT   ESRD on dialysis [facility])    GERD (gastroesophageal reflux disease)    Hiatal hernia    History of echocardiogram    a. 09/2022 Echo: EF 60-65%, mild asymm LVH, mod reduced RV fxn, RVSP . Mildly dil RA. Mild AI. Ao sclerosis. Mild MR.   Hyperlipidemia    Hypertension    a. 12/2016 labetalol d/c'd 2/2 hypotension in HD.   Morbid obesity (HCC)    Obesity    Persistent atrial fibrillation (HCC)    a. 05/2023 Zio: 100% afib avg HR 52 (32-99).   Pneumonia    "couple times" (12/04/2016)   Type II diabetes mellitus (HCC)     Past Surgical History:  Procedure Laterality Date   ABDOMINAL HYSTERECTOMY     AV FISTULA PLACEMENT Left 01/27/2014   Procedure: ARTERIOVENOUS (AV) FISTULA CREATION;  Surgeon: Pryor Ochoa, MD;  Location: [facility] OR;  Service: Vascular;  Laterality: Left;   AV FISTULA REPAIR     "had it cleaned out"   CARDIAC CATHETERIZATION  ?1980s   CARDIAC CATHETERIZATION N/A 12/04/2016   Procedure: Left Heart Cath Angiography;  Surgeon: Kathleene Hazel, MD;  Location: [facility] INVASIVE CV LAB;  Service: Cardiovascular;  Laterality: N/A;   CARDIAC CATHETERIZATION N/A 12/04/2016   Procedure: Coronary Stent Intervention;  Surgeon: Kathleene Hazel, MD;  Location: [facility] INVASIVE CV LAB;  Service: Cardiovascular;  Laterality: N/A;  Mid RCA   CATARACT EXTRACTION W/ INTRAOCULAR LENS IMPLANT Left    COLONOSCOPY W/ BIOPSIES AND POLYPECTOMY     CORONARY ANGIOPLASTY WITH STENT PLACEMENT  12/04/2016   ESOPHAGOGASTRODUODENOSCOPY     INSERTION OF DIALYSIS  CATHETER N/A 01/19/2014   Procedure: INSERTION OF DIALYSIS CATHETER;  Surgeon: Larina Earthly, MD;  Location: [facility] OR;  Service: Vascular;  Laterality: N/A;     Social History   Tobacco Use   Smoking status: Former    Packs/day: 1.00    Years: 20.00    Additional pack years: 0.00  Total pack years: 20.00    Types: Cigarettes    Quit date: 41    Years since quitting: 39.5   Smokeless tobacco: Never  Vaping Use   Vaping Use: Never used  Substance Use Topics   Alcohol use: No    Alcohol/week: 0.0 standard drinks of alcohol   Drug use: No     Family History  Problem Relation Age of Onset   Diabetes Mother    Peripheral vascular disease Mother        amputation   CAD Father    Heart disease Father    Hypertension Father    Heart attack Father    CAD Sister    Cancer Brother        x 2 brothers, type unknown   Breast cancer Sister     Allergies  Allergen Reactions   Other Other (See Comments)   Sulfa Antibiotics Other (See Comments)   Sulfa Drugs Cross Reactors Other (See Comments)    Doesn't remember      REVIEW OF SYSTEMS (Negative unless checked)  Constitutional: [] Weight loss  [] Fever  [] Chills Cardiac: [] Chest pain   [] Chest pressure   [x] Palpitations   [] Shortness of breath when laying flat   [x] Shortness of breath at rest   [x] Shortness of breath with exertion. Vascular:  [] Pain in legs with walking   [] Pain in legs at rest   [] Pain in legs when laying flat   [] Claudication   [] Pain in feet when walking  [] Pain in feet at rest  [] Pain in feet when laying flat   [] History of DVT   [] Phlebitis   [x] Swelling in legs   [] Varicose veins   [] Non-healing ulcers Pulmonary:   [] Uses home oxygen   [] Productive cough   [] Hemoptysis   [] Wheeze  [] COPD   [] Asthma Neurologic:  [] Dizziness  [] Blackouts   [] Seizures   [] History of stroke   [] History of TIA  [] Aphasia   [] Temporary blindness   [] Dysphagia   [] Weakness or numbness in arms   [] Weakness or numbness in  legs Musculoskeletal:  [x] Arthritis   [] Joint swelling   [x] Joint pain   [] Low back pain Hematologic:  [] Easy bruising  [] Easy bleeding   [] Hypercoagulable state   [x] Anemic  [] Hepatitis Gastrointestinal:  [] Blood in stool   [] Vomiting blood  [] Gastroesophageal reflux/heartburn   [] Difficulty swallowing. Genitourinary:  [x] Chronic kidney disease   [] Difficult urination  [] Frequent urination  [] Burning with urination   [] Blood in urine Skin:  [] Rashes   [] Ulcers   [] Wounds Psychological:  [] History of anxiety   []  History of major depression.  Physical Examination  Vitals:   06/09/23 1200 06/09/23 1215 06/09/23 1230 06/09/23 1300  BP: (!) 83/74 (!) 95/33 (!) 59/50 (!) 109/98  Pulse: 69 72 72 79  Resp: 14 17 16 17   Temp: 98.6 F (37 C)     TempSrc: Oral     SpO2: 94% 95% (!) 87% 98%  Weight:      Height:       Body mass index is 43.71 kg/m. Gen:  WD/WN, NAD Head: [place]/AT, No temporalis wasting.  Ear/Nose/Throat: Hearing grossly intact, nares w/o erythema or drainage, oropharynx w/o Erythema/Exudate Eyes: Sclera non-icteric, conjunctiva clear Neck: Trachea midline.  No JVD.  Pulmonary:  Good air movement, respirations not labored, equal bilaterally.  Cardiac: irregular Vascular: soft thrill in left arm access Vessel Right Left  Radial Palpable Palpable  Musculoskeletal: M/S 5/5 throughout.  Extremities without ischemic changes.  No deformity or atrophy. Mild BLE edema. Dialysis access in left arm Neurologic: Sensation grossly intact in extremities.  Symmetrical.  Speech is fluent. Motor exam as listed above. Psychiatric: Judgment intact, Mood & affect appropriate for pt's clinical situation. Dermatologic: No rashes or ulcers noted.  No cellulitis or open wounds. Lymph : No Cervical, Axillary, or Inguinal lymphadenopathy.     CBC Lab Results  Component Value Date   WBC 12.4 (H) 06/08/2023   HGB 12.6 06/08/2023   HCT 40.2 06/08/2023    MCV 98.8 06/08/2023   PLT 138 (L) 06/08/2023    BMET    Component Value Date/Time   NA 131 (L) 06/09/2023 0350   NA 142 10/10/2021 1109   K 3.8 06/09/2023 0350   CL 91 (L) 06/09/2023 0350   CO2 23 06/09/2023 0350   GLUCOSE 219 (H) 06/09/2023 0350   BUN 72 (H) 06/09/2023 0350   BUN 29 (H) 10/10/2021 1109   CREATININE 9.89 (H) 06/09/2023 0350   CALCIUM 7.2 (L) 06/09/2023 0350   GFRNONAA 4 (L) 06/09/2023 0350   GFRAA 5 (L) 06/07/2020 1041   Estimated Creatinine Clearance: 5.8 mL/min (A) (by C-G formula based on SCr of 9.89 mg/dL (H)).  COAG Lab Results  Component Value Date   INR 1.1 06/22/2019   INR 1.13 12/04/2016   INR 1.07 01/27/2014    Radiology ECHOCARDIOGRAM COMPLETE  Result Date: 06/08/2023    ECHOCARDIOGRAM REPORT   Patient Name:   Jacqueline [name] Date of Exam: 06/08/2023 Medical Rec #:  [id number]         Height:       64.0 in Accession #:    [id number]        Weight:       254.9 lb Date of Birth:  [birth year]          BSA:          2.169 m Patient Age:    77 years          BP:           100/59 mmHg Patient Gender: F                 HR:           63 bpm. Exam Location:  [facility] Procedure: 2D Echo, Color Doppler and Cardiac Doppler Indications:     CHF I50.31  History:         Patient has prior history of Echocardiogram examinations, most                  recent 09/09/2022. CHF, CAD, ESRD, Signs/Symptoms:Chest Pain;                  Risk Factors:Dyslipidemia, Diabetes, Hypertension and Former                  Smoker.  Sonographer:     Dondra Prader RVT RCS Referring Phys:  [id number] Hubbard Hartshorn OUMA Diagnosing Phys: Julien Nordmann MD  Sonographer Comments: Patient is obese. Image acquisition challenging due to patient body habitus. IMPRESSIONS  1. Left ventricular ejection fraction, by estimation, is 60 to 65%. The left ventricle has normal function. The left ventricle has no regional wall motion abnormalities. Left ventricular diastolic parameters are indeterminate.  2. Right  ventricular systolic function is moderately reduced. The right ventricular size is moderately enlarged. There is severely elevated pulmonary artery systolic pressure. The estimated right  ventricular systolic pressure is 79.0 mmHg.  3. Left atrial size was mildly dilated.  4. The mitral valve is normal in structure. Mild mitral valve regurgitation. No evidence of mitral stenosis.  5. Tricuspid valve regurgitation is moderate.  6. The aortic valve is normal in structure. Aortic valve regurgitation is not visualized. Aortic valve sclerosis is present, with no evidence of aortic valve stenosis.  7. The inferior vena cava is dilated in size with <50% respiratory variability, suggesting right atrial pressure of 15 mmHg. FINDINGS  Left Ventricle: Left ventricular ejection fraction, by estimation, is 60 to 65%. The left ventricle has normal function. The left ventricle has no regional wall motion abnormalities. The left ventricular internal cavity size was normal in size. There is  no left ventricular hypertrophy. Left ventricular diastolic parameters are indeterminate. Right Ventricle: The right ventricular size is moderately enlarged. No increase in right ventricular wall thickness. Right ventricular systolic function is moderately reduced. There is severely elevated pulmonary artery systolic pressure. The tricuspid regurgitant velocity is 4.00 m/s, and with an assumed right atrial pressure of 15 mmHg, the estimated right ventricular systolic pressure is 79.0 mmHg. Left Atrium: Left atrial size was mildly dilated. Right Atrium: Right atrial size was normal in size. Pericardium: There is no evidence of pericardial effusion. Mitral Valve: The mitral valve is normal in structure. Mild mitral valve regurgitation. No evidence of mitral valve stenosis. Tricuspid Valve: The tricuspid valve is normal in structure. Tricuspid valve regurgitation is moderate . No evidence of tricuspid stenosis. Aortic Valve: The aortic valve is  normal in structure. Aortic valve regurgitation is not visualized. Aortic valve sclerosis is present, with no evidence of aortic valve stenosis. Aortic valve mean gradient measures 6.0 mmHg. Aortic valve peak gradient measures 10.5 mmHg. Aortic valve area, by VTI measures 1.88 cm. Pulmonic Valve: The pulmonic valve was normal in structure. Pulmonic valve regurgitation is trivial. No evidence of pulmonic stenosis. Aorta: The aortic root is normal in size and structure. Venous: The inferior vena cava is dilated in size with less than 50% respiratory variability, suggesting right atrial pressure of 15 mmHg. IAS/Shunts: No atrial level shunt detected by color flow Doppler.  LEFT VENTRICLE PLAX 2D LVIDd:         5.00 cm   Diastology LVIDs:         3.20 cm   LV e' medial:    6.81 cm/s LV PW:         0.90 cm   LV E/e' medial:  14.8 LV IVS:        1.30 cm   LV e' lateral:   9.32 cm/s LVOT diam:     1.80 cm   LV E/e' lateral: 10.8 LV SV:         58 LV SV Index:   27 LVOT Area:     2.54 cm  RIGHT VENTRICLE             IVC RV Basal diam:  4.30 cm     IVC diam: 2.00 cm RV Mid diam:    4.60 cm RV S prime:     12.10 cm/s TAPSE (M-mode): 1.6 cm LEFT ATRIUM             Index        RIGHT ATRIUM           Index LA diam:        4.60 cm 2.12 cm/m   RA Area:     19.60 cm LA Vol (  A2C):   78.1 ml 36.01 ml/m  RA Volume:   56.40 ml  26.01 ml/m LA Vol (A4C):   56.3 ml 25.96 ml/m LA Biplane Vol: 71.6 ml 33.02 ml/m  AORTIC VALVE                     PULMONIC VALVE AV Area (Vmax):    1.59 cm      PV Vmax:       0.93 m/s AV Area (Vmean):   1.54 cm      PV Peak grad:  3.4 mmHg AV Area (VTI):     1.88 cm AV Vmax:           162.00 cm/s AV Vmean:          112.500 cm/s AV VTI:            0.310 m AV Peak Grad:      10.5 mmHg AV Mean Grad:      6.0 mmHg LVOT Vmax:         101.00 cm/s LVOT Vmean:        68.100 cm/s LVOT VTI:          0.229 m LVOT/AV VTI ratio: 0.74  AORTA Ao Root diam: 2.60 cm Ao Asc diam:  3.00 cm MITRAL VALVE                 TRICUSPID VALVE MV Area (PHT): 3.31 cm     TR Peak grad:   64.0 mmHg MV Decel Time: 229 msec     TR Mean grad:   39.0 mmHg MV E velocity: 101.00 cm/s  TR Vmax:        400.00 cm/s                             TR Vmean:       291.0 cm/s                              SHUNTS                             Systemic VTI:  0.23 m                             Systemic Diam: 1.80 cm Julien Nordmann MD Electronically signed by Julien Nordmann MD Signature Date/Time: 06/08/2023/3:07:42 PM    Final    DG Chest Portable 1 View  Result Date: 06/07/2023 CLINICAL DATA:  Sepsis.  Weakness. EXAM: PORTABLE CHEST 1 VIEW COMPARISON:  09/28/2022. FINDINGS: Stable heart size.The cardiomediastinal contours are unchanged with aortic atherosclerosis. The lungs are clear. Pulmonary vasculature is normal. No consolidation, pleural effusion, or pneumothorax. No acute osseous abnormalities are seen. IMPRESSION: No acute chest findings. Electronically Signed   By: Narda Rutherford M.D.   On: 06/07/2023 15:01   CT ABDOMEN PELVIS W CONTRAST  Result Date: 06/07/2023 CLINICAL DATA:  Abdominal pain. EXAM: CT ABDOMEN AND PELVIS WITH CONTRAST TECHNIQUE: Multidetector CT imaging of the abdomen and pelvis was performed using the standard protocol following bolus administration of intravenous contrast. RADIATION DOSE REDUCTION: This exam was performed according to the departmental dose-optimization program which includes automated exposure control, adjustment of the mA and/or kV according to patient size and/or use of iterative reconstruction technique. CONTRAST:  OMNIPAQUE  IOHEXOL 300 MG/ML  SOLN COMPARISON:  05/29/2022 FINDINGS: Lower chest: Subsegmental atelectasis and/or linear scarring noted in the lung bases. Hepatobiliary: No suspicious focal abnormality within the liver parenchyma. There is no evidence for gallstones, gallbladder wall thickening, or pericholecystic fluid. No intrahepatic or extrahepatic biliary dilation. Pancreas: No focal mass  lesion. No dilatation of the main duct. No intraparenchymal cyst. No peripancreatic edema. Spleen: No splenomegaly. No suspicious focal mass lesion. Adrenals/Urinary Tract: No adrenal nodule or mass. Both kidneys are atrophic. Tiny well-defined homogeneous low-density lesions in both kidneys are too small to characterize but are statistically most likely benign and probably cysts. No followup imaging is recommended. 9.6 cm simple cyst exophytic from the upper interpolar left kidney is mildly progressive in the interval. No evidence for hydroureter. Bladder is decompressed. Stomach/Bowel: Stomach is unremarkable. No gastric wall thickening. No evidence of outlet obstruction. Duodenum is normally positioned as is the ligament of Treitz. No small bowel wall thickening. No small bowel dilatation. The terminal ileum is normal. The appendix is normal. No gross colonic mass. No colonic wall thickening. Vascular/Lymphatic: There is mild atherosclerotic calcification of the abdominal aorta without aneurysm. There is no gastrohepatic or hepatoduodenal ligament lymphadenopathy. No retroperitoneal or mesenteric lymphadenopathy. No pelvic sidewall lymphadenopathy. Reproductive: Hysterectomy.  There is no adnexal mass. Other: No intraperitoneal free fluid. Musculoskeletal: Marked laxity of the anterior abdominal wall fascia again noted. No worrisome lytic or sclerotic osseous abnormality. IMPRESSION: 1. Stable exam. No acute findings in the abdomen or pelvis. Specifically, no findings to explain the patient's history of abdominal pain. 2. Bilateral renal atrophy with multiple simple cysts again noted. No followup imaging is recommended. 3.  Aortic Atherosclerosis (ICD10-I70.0). Electronically Signed   By: Kennith Center M.D.   On: 06/07/2023 14:59   LONG TERM MONITOR (3-14 DAYS)  Result Date: 05/14/2023 Patch Wear Time:  13 days and 23 hours (2024-05-15T13:34:58-0400 to 2024-05-29T13:34:54-0400) Atrial Fibrillation occurred  continuously (100% burden), ranging from 32-99 bpm (avg of 52 bpm). Rare PVCs. Most bradycardic events happened during early morning hours.     Assessment/Plan 1.  Hypotension requiring pressors with inability to adequately monitor her blood pressure.  I will place a right radial arterial line at bedside.  Risks and benefits have previously been discussed with the son and the patient is now agreeable as well. 2.  ESRD.  Limited options for placement with her left arm access.  Plan to use the right radial artery. 3.  Cardiac arrhythmia/bradycardia.  In process of being evaluated for pacemaker placement.   Festus Barren, MD  06/09/2023 2:02 PM    This note was created with Dragon medical transcription system.  Any error is purely unintentional

## 2023-06-09 NOTE — H&P (Shared)
NAME:  Jacqueline [name], MRN:  [id number], DOB:  [birth year], LOS: 0 ADMISSION DATE:  (Not on file) CONSULTATION DATE:  06/09/2023 REFERRING MD:  [facility] CHIEF COMPLAINT:  Bradycardia, hypotension   History of Present Illness:  77 year old woman who presented to [facility] 7/6 from HD for generalized weakness, loose stools and 1 episode of emesis. PMHx significant for HLD, CAD with NSTEMI (s/p PCI to RCA 12/2016), PAF (on not on [facility]), ESRD (on HD TThS via LUE AVF) c/b hypotension (on midodrine), GERD, diverticulosis.  On ED arrival, patient was afebrile, bradycardic with HR 40s-50s and hypotensive with BP 81/51. Midodrine was given as well as a small volume ( ) bolus and broad-spectrum antibiotics (cefepime/vanc/Flagyl) initiated. Labs were notable for WBC 11.5, Hgb 12.4, Plt 146. Na 133, K 3.4, CO2 27, BUN/Cr 55/7.76, Ca 7.7. Lipase WNL, LA 1.3. TSH/T4 WNL. Bcx NGTD. CXR unremarkable. CT A/P 7/6 without acute findings; +bilateral renal atrophy with simple cysts. Later in the evening 7/6, patient became more profoundly bradycardic to 30s refractory to atropine administration. Transferred to ICU for further management and vasopressors.  Dopamine gtt was started in ICU. Echo 7/7 demonstrated EF 60-65%, moderately reduced RV function with severely elevated PASP (RVSP 79). Cardiology was consulted for evaluation and recommended medical management and EP consult for pacemaker placement.   Transferred to [facility] for leadless pacemaker placement.  Pertinent Medical History:   Past Medical History:  Diagnosis Date   Arthritis    "left knee" (12/04/2016)   CAD (coronary artery disease)    a. 12/2016 NSTEMI/PCI: LM nl, LaD 20p/d, D1 20, OM2 20, OM3 20, RCA 26m (3.0x18 Resolute DES). EF 65%.   Chronic lower back pain    Diverticulosis    on CT   ESRD on dialysis [facility])    GERD (gastroesophageal reflux disease)    Hiatal hernia    History of echocardiogram    a. 09/2022 Echo: EF 60-65%, mild asymm LVH, mod reduced  RV fxn, RVSP . Mildly dil RA. Mild AI. Ao sclerosis. Mild MR.   Hyperlipidemia    Hypertension    a. 12/2016 labetalol d/c'd 2/2 hypotension in HD.   Morbid obesity (HCC)    Obesity    Persistent atrial fibrillation (HCC)    a. 05/2023 Zio: 100% afib avg HR 52 (32-99).   Pneumonia    "couple times" (12/04/2016)   Type II diabetes mellitus (HCC)    Significant Hospital Events: Including procedures, antibiotic start and stop dates in addition to other pertinent events     Interim History / Subjective:  PCCM consulted for ICU admission, transfer from [facility].  Objective:  There were no vitals taken for this visit.        Intake/Output Summary (Last 24 hours) at 06/09/2023 1907 Last data filed at 06/09/2023 1802 Gross per 24 hour  Intake 2286.7 ml  Output --  Net 2286.7 ml   There were no vitals filed for this visit.  Physical Examination: General: {SRACUITY:25313} ill-appearing *** in NAD. HEENT: [place]/AT, anicteric sclera, PERRL, moist mucous membranes. Neuro: {SRLOC:25308} {SRSTIMULI:25309} {SRCOMMANDS:25310} {SRNEUROEXTREMITIES:25312} Strength ***/5 in *** extremities. {SRRBRAINSTEM:25311}  CV: RRR, no m/g/r. PULM: Breathing even and unlabored on ***. Lung fields ***. GI: Soft, nontender, nondistended. Normoactive bowel sounds. Extremities: *** LE edema noted. Skin: Warm/dry, ***.  Resolved Hospital Problem List:    Assessment & Plan:  Symptomatic bradycardia with hypotension CAD with NSTEMI (s/p PCI to RCA 12/2016) HLD Echo 7/7 demonstrated EF 60-65%, moderately reduced RV function with severely elevated PASP (  RVSP 79). - Transferred to [facility] for further cardiac management - Cardiology consulted, following - EP consulted with recommendation for leadless pacemaker - Goal MAP > 65 - Cautious fluid resuscitation in the setting of HD dependence - Continue midodrine - Dopamine titrated to goal HR/MAP  PAF (on not on [facility]) - Cardiac monitoring - Optimize electrolytes for K  > 4, Mg > 2  ESRD (on HD TThS via LUE AVF) - Consult Nephrology for HD resumption as tolerated - Trend BMP - Replete electrolytes as indicated - Monitor I&Os - Avoid nephrotoxic agents as able - Ensure adequate renal perfusion  GERD - PPI  Best Practice: (right click and "Reselect all SmartList Selections" daily)   Diet/type: {diet type:25684} DVT prophylaxis: {anticoagulation (Optional):25687} GI prophylaxis: {ZO:10960} Lines: {Central Venous Access:25771} Foley:  {Central Venous Access:25691} Code Status:  {Code Status:26939} Last date of multidisciplinary goals of care discussion [***]  Labs:  CBC: Recent Labs  Lab 06/07/23 1254 06/08/23 0726 06/09/23 1238  WBC 11.5* 12.4* 11.2*  HGB 12.4 12.6 10.7*  HCT 41.0 40.2 33.8*  MCV 102.0* 98.8 97.1  PLT 146* 138* 120*  Basic Metabolic Panel: Recent Labs  Lab 06/07/23 1254 06/08/23 0726 06/09/23 0350  NA 133* 133* 131*  K 3.4* 4.1 3.8  CL 92* 92* 91*  CO2 27 25 23   GLUCOSE 130* 162* 219*  BUN 55* 62* 72*  CREATININE 7.76* 8.41* 9.89*  CALCIUM 7.7* 7.7* 7.2*  MG  --  2.0 1.8  PHOS  --  5.3* 6.0*  GFR: CrCl cannot be calculated (Unknown ideal weight.). Recent Labs  Lab 06/07/23 1254 06/08/23 0726 06/09/23 1238 06/09/23 1739  WBC 11.5* 12.4* 11.2*  --   LATICACIDVEN 1.3  --  0.7 0.6  Liver Function Tests: No results for input(s): "AST", "ALT", "ALKPHOS", "BILITOT", "PROT", "ALBUMIN" in the last 168 hours. Recent Labs  Lab 06/07/23 1254  LIPASE 51   No results for input(s): "AMMONIA" in the last 168 hours.  ABG:    Component Value Date/Time   PHART 7.43 05/21/2022 2300   PCO2ART 42 05/21/2022 2300   PO2ART 66 (L) 05/21/2022 2300   HCO3 27.9 05/21/2022 2300   TCO2 31 01/19/2014 1614   ACIDBASEDEF 1.0 01/19/2014 1238   O2SAT 95.2 05/21/2022 2300    Coagulation Profile: No results for input(s): "INR", "PROTIME" in the last 168 hours.  Cardiac Enzymes: No results for input(s): "CKTOTAL", "CKMB",  "CKMBINDEX", "TROPONINI" in the last 168 hours.  HbA1C: Hemoglobin A1C  Date/Time Value Ref Range Status  06/12/2021 12:00 AM 6.6  Final   Hgb A1c MFr Bld  Date/Time Value Ref Range Status  06/07/2023 07:44 PM 5.8 (H) 4.8 - 5.6 % Final    Comment:    (NOTE) Pre diabetes:          5.7%-6.4%  Diabetes:              >6.4%  Glycemic control for   <7.0% adults with diabetes   09/29/2022 04:55 PM 5.2 4.8 - 5.6 % Final    Comment:    (NOTE) Pre diabetes:          5.7%-6.4%  Diabetes:              >6.4%  Glycemic control for   <7.0% adults with diabetes    CBG: Recent Labs  Lab 06/09/23 0350 06/09/23 0724 06/09/23 1113 06/09/23 1542 06/09/23 1743  GLUCAP 202* 179* 114* 190* 139*   Review of Systems:   ***  Past  Medical History:  She,  has a past medical history of Arthritis, CAD (coronary artery disease), Chronic lower back pain, Diverticulosis, ESRD on dialysis (HCC), GERD (gastroesophageal reflux disease), Hiatal hernia, History of echocardiogram, Hyperlipidemia, Hypertension, Morbid obesity (HCC), Obesity, Persistent atrial fibrillation (HCC), Pneumonia, and Type II diabetes mellitus (HCC).   Surgical History:   Past Surgical History:  Procedure Laterality Date   ABDOMINAL HYSTERECTOMY     AV FISTULA PLACEMENT Left 01/27/2014   Procedure: ARTERIOVENOUS (AV) FISTULA CREATION;  Surgeon: Pryor Ochoa, MD;  Location[facility] OR;  Service: Vascular;  Laterality: Left;   AV FISTULA REPAIR     "had it cleaned out"   CARDIAC CATHETERIZATION  ?1980s   CARDIAC CATHETERIZATION N/A 12/04/2016   Procedure: Left Heart Cath Angiography;  Surgeon: Kathleene Hazel, MD;  Location: [facility] INVASIVE CV LAB;  Service: Cardiovascular;  Laterality: N/A;   CARDIAC CATHETERIZATION N/A 12/04/2016   Procedure: Coronary Stent Intervention;  Surgeon: Kathleene Hazel, MD;  Location: [facility] INVASIVE CV LAB;  Service: Cardiovascular;  Laterality: N/A;  Mid RCA   CATARACT EXTRACTION W/ INTRAOCULAR  LENS IMPLANT Left    COLONOSCOPY W/ BIOPSIES AND POLYPECTOMY     CORONARY ANGIOPLASTY WITH STENT PLACEMENT  12/04/2016   ESOPHAGOGASTRODUODENOSCOPY     INSERTION OF DIALYSIS CATHETER N/A 01/19/2014   Procedure: INSERTION OF DIALYSIS CATHETER;  Surgeon: Larina Earthly, MD;  Location: [facility] OR;  Service: Vascular;  Laterality: N/A;   Social History:   reports that she quit smoking about 39 years ago. Her smoking use included cigarettes. She has a 20.00 pack-year smoking history. She has never used smokeless tobacco. She reports that she does not drink alcohol and does not use drugs.   Family History:  Her family history includes Breast cancer in her sister; CAD in her father and sister; Cancer in her brother; Diabetes in her mother; Heart attack in her father; Heart disease in her father; Hypertension in her father; Peripheral vascular disease in her mother.   Allergies: Allergies  Allergen Reactions   Other Other (See Comments)   Sulfa Antibiotics Other (See Comments)   Sulfa Drugs Cross Reactors Other (See Comments)    Doesn't remember    Home Medications: Prior to Admission medications   Medication Sig Start Date End Date Taking? Authorizing Provider  acetaminophen (TYLENOL) 650 MG CR tablet Take 1,300 mg by mouth every 8 (eight) hours as needed for pain.    [provider]  albuterol (VENTOLIN HFA) 108 (90 Base) MCG/ACT inhaler Inhale 1-2 puffs into the lungs every 6 (six) hours as needed for wheezing or shortness of breath. 12/15/21   Edwin Dada P, DO  alteplase (CATHFLO ACTIVASE) 2 MG injection 2 mg by Intracatheter route once as needed for open catheter. 06/09/23   Harlon Ditty D, NP  anticoagulant sodium citrate 4 GM/100ML SOLN 5 mLs by Intracatheter route as needed (in dialysis, If TTP or HIT.). 06/09/23   Judithe Modest, NP  apixaban (ELIQUIS) 5 MG TABS tablet Take 1 tablet (5 mg total) by mouth 2 (two) times daily. 10/02/22   Tresa Moore, MD  B Complex-C-Zn-Folic  Acid (DIALYVITE/ZINC) TABS Take 1 tablet by mouth daily.    [provider]  bisacodyl (DULCOLAX) 5 MG EC tablet Take 2 tablets (10 mg total) by mouth daily as needed for moderate constipation. 10/02/22   Tresa Moore, MD  bisacodyl (DULCOLAX) 5 MG EC tablet Take 2 tablets (10 mg total) by mouth daily as needed for  moderate constipation. 06/09/23   Judithe Modest, NP  Chlorhexidine Gluconate Cloth 2 % PADS Apply 6 each topically daily. 06/10/23   Harlon Ditty D, NP  cinacalcet (SENSIPAR) 30 MG tablet Take 30 mg by mouth daily with supper.    [provider]  cinacalcet (SENSIPAR) 30 MG tablet Take 1 tablet (30 mg total) by mouth daily with supper. 06/09/23   Judithe Modest, NP  docusate sodium (COLACE) 100 MG capsule Take 1 capsule (100 mg total) by mouth 2 (two) times daily as needed for mild constipation. 06/09/23   Judithe Modest, NP  ezetimibe (ZETIA) 10 MG tablet Take 1 tablet (10 mg total) by mouth daily. 04/30/22   Iran Ouch, MD  ferric citrate (AURYXIA) 1 GM 210 MG(Fe) tablet Take 420 mg by mouth 3 (three) times daily with meals.    [provider]  ferric citrate (AURYXIA) 1 GM 210 MG(Fe) tablet Take 2 tablets (420 mg total) by mouth 3 (three) times daily with meals. 06/09/23   Judithe Modest, NP  fexofenadine (ALLEGRA) 180 MG tablet Take 180 mg by mouth daily.    [provider]  fludrocortisone (FLORINEF) 0.1 MG tablet Take 1 tablet (0.1 mg total) by mouth daily. 06/10/23   Harlon Ditty D, NP  heparin 1000 unit/mL SOLN injection 1 mL (1,000 Units total) by Intracatheter route as needed (in dialysis). 06/09/23   Judithe Modest, NP  insulin aspart (NOVOLOG) 100 UNIT/ML injection Inject 0-6 Units into the skin every 4 (four) hours. 06/09/23   Harlon Ditty D, NP  lidocaine, PF, (XYLOCAINE) 1 % SOLN injection Inject 5 mLs into the skin as needed (topical anesthesia for hemodialysis if GEBAUERS is ineffective.). 06/09/23   Judithe Modest,  NP  lidocaine-prilocaine (EMLA) cream Apply 1 Application topically as needed (topical anesthesia for hemodialysis if Gebauers and Lidocaine injection are ineffective.). 06/09/23   Judithe Modest, NP  loratadine (CLARITIN) 10 MG tablet Take 1 tablet (10 mg total) by mouth daily. 06/10/23   Judithe Modest, NP  midodrine (PROAMATINE) 10 MG tablet Take 20 mg by mouth daily. (Take additional two tablets [20mg ] for administration at dialysis on Tuesday, Thursday and Saturday)    [provider]  midodrine (PROAMATINE) 10 MG tablet Take 20 mg by mouth daily as needed (for SBP <100).    [provider]  midodrine (PROAMATINE) 10 MG tablet Take 1 tablet (10 mg total) by mouth 3 (three) times daily with meals. 06/09/23   Harlon Ditty D, NP  multivitamin (RENA-VIT) TABS tablet Take 1 tablet by mouth at bedtime. 06/09/23   Harlon Ditty D, NP  norepinephrine (LEVOPHED) 4-5 MG/250ML-% SOLN Inject 2-10 mcg/min into the vein continuous. 06/09/23   Judithe Modest, NP  Nutritional Supplements (FEEDING SUPPLEMENT, NEPRO CARB STEADY,) LIQD Take 237 mLs by mouth 3 (three) times daily between meals. 06/09/23   Judithe Modest, NP  Omega-3 Fatty Acids (FISH OIL) 1000 MG CPDR Take 1,000 mg by mouth daily.    [provider]  omeprazole (PRILOSEC) 20 MG capsule Take 1 capsule (20 mg total) by mouth daily. Patient taking differently: Take 20 mg by mouth 2 (two) times daily. 02/21/22   Arnette Felts, FNP  ondansetron (ZOFRAN) 4 MG/2ML SOLN injection Inject 2 mLs (4 mg total) into the vein every 6 (six) hours as needed for vomiting or nausea. 06/09/23   Judithe Modest, NP  ondansetron (ZOFRAN-ODT) 4 MG disintegrating tablet Take 4 mg by mouth every  8 (eight) hours as needed for nausea or vomiting.    [provider]  pentafluoroprop-tetrafluoroeth Peggye Pitt) AERO Apply 1 Application topically as needed (topical anesthesia for hemodialysis). 06/09/23   Judithe Modest, NP  polyethylene  glycol (MIRALAX / GLYCOLAX) 17 g packet Take 17 g by mouth at bedtime.    [provider]  polyethylene glycol (MIRALAX / GLYCOLAX) 17 g packet Take 17 g by mouth daily as needed for moderate constipation. 06/09/23   Judithe Modest, NP  pregabalin (LYRICA) 100 MG capsule Take 100 mg by mouth at bedtime.    [provider]  pregabalin (LYRICA) 100 MG capsule Take 1 capsule (100 mg total) by mouth at bedtime. 06/09/23   Judithe Modest, NP  pregabalin (LYRICA) 50 MG capsule Take 50 mg by mouth 2 (two) times daily.    [provider]  rosuvastatin (CRESTOR) 10 MG tablet Take 1 tablet (10 mg total) by mouth daily. 10/03/22   Tresa Moore, MD  sennosides-docusate sodium (SENOKOT-S) 8.6-50 MG tablet Take 1 tablet by mouth 2 (two) times daily.    [provider]  sodium chloride 0.9 % infusion Inject 250 mLs into the vein continuous. 06/09/23   Judithe Modest, NP  trolamine salicylate (ASPERCREME) 10 % cream Apply 1 Application topically 2 (two) times daily. (Apply to knees)    [provider]   Critical care time:   The patient is critically ill with multiple organ system failure and requires high complexity decision making for assessment and support, frequent evaluation and titration of therapies, advanced monitoring, review of radiographic studies and interpretation of complex data.   Critical Care Time devoted to patient care services, exclusive of separately billable procedures, described in this note is *** minutes.  Tim Lair, PA-C [facility] 06/09/23 7:07 PM  Please see [email or web address] for pager details.  From 7A-7P if no response, please call [phone] After hours, please call ELink [phone]

## 2023-06-09 NOTE — Consult Note (Signed)
PHARMACY CONSULT NOTE - FOLLOW UP  Pharmacy Consult for Electrolyte Monitoring and Replacement   Recent Labs: Potassium (mmol/L)  Date Value  06/09/2023 3.8   Magnesium (mg/dL)  Date Value  16/09/9603 1.8   Calcium (mg/dL)  Date Value  54/08/8118 7.2 (L)   Albumin (g/dL)  Date Value  14/78/2956 3.8  10/10/2021 4.1   Phosphorus (mg/dL)  Date Value  21/30/8657 6.0 (H)   Sodium (mmol/L)  Date Value  06/09/2023 131 (L)  10/10/2021 142     Assessment: 77 y.o female with significant PMH of HTN, T2DM,ESRD on HD TTS, Chronic debility , CAD s/p stent, PAF,  OSA, PAD, Left knee arthritis, Chronic lower back pain,GERD,Obesity , HLD  who presented to the ED  from Dialysis with chief complaints of generalized weakness x 4 days.   Goal of Therapy:  WNL  Plan:  No replacement needed F/u with AM labs.   Elliot Gurney, PharmD, BCPS Clinical Pharmacist  06/09/2023 8:08 AM

## 2023-06-09 NOTE — Progress Notes (Signed)
NAME:  Jacqueline [name], MRN:  [id number], DOB:  October 09, 1946, LOS: 2 ADMISSION DATE:  06/07/2023, CONSULTATION DATE:  06/07/2023 REFERRING MD:  Dr. Allena Katz, CHIEF COMPLAINT:  Hypotension   Brief Pt Description / Synopsis:  77 y.o female with PMHx most significant for chronic hypotension (SBP in 80's on Midodrine 10 mg TID) and ESRD on HD (TTS) admitted with Cardiogenic shock due to Bradycardia.  EP requesting transfer to [facility] for Leadless Pacemaker placement.  History of Present Illness:  77 y.o female with significant PMH of HTN, T2DM,ESRD on HD TTS, Chronic debility , CAD s/p stent, PAF,  OSA, PAD, Left knee arthritis, Chronic lower back pain,GERD,Obesity , HLD  who presented to the ED  from Dialysis with chief complaints of generalized weakness x 4 days.   EMS report: NVD and LLQ abdominal pain since last night. States tried to eat yesterday, each time she ate she vomited, then had diarrhea last night. No NVD today. She went to Dialysis today and was noted with low BP of 72/42. She usually runs lows BP and takes Midodrine but today her BP was lower than usually so she was not dialyzed but was sent to ED for further evaluation.   ED Course:  Initial vital signs showed HR of 50 beats/minute, BP 81/51 mm Hg, the RR 16 breaths/minute, and the oxygen saturation 91% on 2L and a temperature of 97.56F (36.6C).   Pertinent Labs/Diagnostics.   Na+/ K+: 133/3.4 Glucose:130 BUN/Cr.:55/7.76 WBC:11.5 Plts: 146 PCT: pending Lactic acid: 1.3 EKG:?Afib vs junctional bradycardia rate 40bmp. Incomplete RBB,   CXR, CT scan obtained and show no signs of infection or other abnormality. Patient given, midodrine, atropine for significant bradycardia and  500 cc of fluids and started on broad-spectrum antibiotics Vanco cefepime and Flagyl for  suspected sepsis of unknown source. Patient remained hypotensive despite  intervention as above. PCCM consulted for possible pressors.  Pertinent  Medical History     Arthritis       "left knee" (12/04/2016)   CAD (coronary artery disease)      a. 12/2016 NSTEMI/PCI: LM nl, LaD 20p/d, D1 20, OM2 20, OM3 20, RCA 93m (3.0x18 Resolute DES). EF 65%.   Chronic lower back pain     ESRD on dialysis [facility] [place])      "[facility]; [address]; TTS" (12/04/2016)   GERD (gastroesophageal reflux disease)     Hyperlipidemia     Hypertension      a. 12/2016 labetalol d/c'd 2/2 hypotension in HD.   Morbid obesity (HCC)     Obesity     Pneumonia      "couple times" (12/04/2016)   Type II diabetes mellitus (HCC)     Micro Data:  7/6: Blood culture x2>> no growth to date 7/6: MRSA PCR>>negative  Antimicrobials:   Anti-infectives (From admission, onward)    Start     Dose/Rate Route Frequency Ordered Stop   06/10/23 1200  vancomycin (VANCOCIN) IVPB 1000 mg/200 mL premix       See Hyperspace for full Linked Orders Report.   1,000 mg 200 mL/hr over 60 Minutes Intravenous Every T-Th-Sa (Hemodialysis) 06/08/23 1058     06/08/23 1145  vancomycin (VANCOCIN) IVPB 1000 mg/200 mL premix       See Hyperspace for full Linked Orders Report.   1,000 mg 200 mL/hr over 60 Minutes Intravenous  Once 06/08/23 1058 06/08/23 1655   06/08/23 1100  piperacillin-tazobactam (ZOSYN) IVPB 2.25 g        2.25  g 100 mL/hr over 30 Minutes Intravenous Every 8 hours 06/08/23 1058     06/07/23 1400  metroNIDAZOLE (FLAGYL) IVPB 500 mg        500 mg 100 mL/hr over 60 Minutes Intravenous  Once 06/07/23 1346 06/07/23 1652   06/07/23 1400  ceFEPIme (MAXIPIME) 2 g in sodium chloride 0.9 % 100 mL IVPB        2 g 200 mL/hr over 30 Minutes Intravenous  Once 06/07/23 1349 06/07/23 1512   06/07/23 1400  vancomycin (VANCOCIN) IVPB 1000 mg/200 mL premix        1,000 mg 200 mL/hr over 60 Minutes Intravenous  Once 06/07/23 1349 06/07/23 1652        Significant Hospital Events: Including procedures, antibiotic start and stop dates in addition to other pertinent events   06/07/2023: admit to ICU  with bradycardia, started on dopamine 06/08/2023: weaned off dopamine, hypotensive again, dopamine restarted 06/09/2023:  Remains hypotensive requiring Levophed, (pt is vasculopath, doubt accuracy of cuff BP as pt is awake, alert, and asymptomatic).  Attempted Arterial line but unsuccessful, Vascular Surgery consulted to assist with placement. Nephrology consulted for renal replacement therapy.  Interim History / Subjective:  -No significant events noted overnight -Afebrile, intermittently on Levophed, doubt accuracy of cuff BP as one SBP 60-70's and the next hypertensive ~ doesn't match pt clinically as she is awake, alert, and asymptomatic -Attempted to obtain art line but unsuccessful as she is a vasculopath ~ consult Vascular Surgery to assist with obtaining A-line -Restart outpatient midodrine -Awake and alert, oriented -Has not been dialyzed since admission ~ Nephrology consulted for renal replacement therapy -Cultures with no growth to date, low suspicion for sepsis ~ Discussed with Dr. Belia Heman, will d/c ABX -EP evaluated pt and is requesting transfer to [facility] for Leadless Pacemaker placement   Objective   Blood pressure (!) 78/27, pulse 86, temperature 98.7 F (37.1 C), temperature source Axillary, resp. rate 15, height 5\' 4"  (1.626 m), weight 115.5 kg, SpO2 96 %.        Intake/Output Summary (Last 24 hours) at 06/09/2023 0755 Last data filed at 06/09/2023 1610 Gross per 24 hour  Intake 1667.94 ml  Output 250 ml  Net 1417.94 ml   Filed Weights   06/07/23 2300 06/08/23 0439 06/09/23 0500  Weight: 115.6 kg 115.6 kg 115.5 kg    Examination: General: Acute on chronically ill-appearing obese female, laying in bed, on 2 L nasal cannula, no acute distress HENT: Atraumatic, normocephalic, neck supple, +JVD Lungs: Distant breath sounds due to body habitus, even, nonlabored Cardiovascular: Irregular irregular rhythm, rate controlled, no murmurs, rubs, gallops Abdomen: Obese, soft,  nontender, no guarding or rebound tenderness, bowel sounds positive x 4 Extremities: Normal bulk and tone, no deformities, trace edema bilateral lower extremities Neuro: Sleeping, arouses easily to voice, or oriented x 3, moves all extremities to command, no focal deficits, speech clear GU: Deferred  Resolved Hospital Problem list     Assessment & Plan:   #Circulatory Shock: Suspect Cardiogenic given Bradycardia ~ IMPROVED #Afib with slow ventricular rate ~ RATE IMPROVED PMHx: CAD, HLD, permanent A. Fib, chronic hypotension with SBP in 80's on Midodrine 10 mg TID Echocardiogram 06/08/23: LVEF 60-65%, indeterminate diastolic parameters, RV systolic function mildly reduced, severe pulmonary hypertension, moderate tricuspid regurgitation -Continuous cardiac monitoring -Maintain MAP >65 -Vasopressors as needed to maintain MAP goal -Trend lactic acid until normalized -Resume outpatient Midodrine -TSH normal at 1.49 -Cortisol is 6.9 ~ Continue Fludrocortisone -Cardiology following, appreciate input ~  EP evaluated and recommends transfer to [facility] for Leadless Pacemaker placement -Continue outpatient Eliquis for anticoagulation for a.fib  #Concern for Sepsis of unknown etiology ~ LOW SUSPICION FOR SEPSIS No clear sign of sepsis though patient is hypotensive. Blood cultures are drawn and are negative to date.  CXR without sign of pneumonia, unable to make urine -Monitor fever curve -Trend WBC's & Procalcitonin -Follow cultures as above -Discussed with Dr. Belia Heman, will monitor off ABX 7/8 pending cultures & sensitivities, with low threshold to resume ABX  #ESRD on HD  #Hyponatremia #AG Metabolic Acidosis -Monitor I&O's / urinary output -Follow BMP -Ensure adequate renal perfusion -Avoid nephrotoxic agents as able -Replace electrolytes as indicated -Nephrology following, appreciate input ~ HD vs CRRT as per Nephrology  #Mild Thrombocytopenia -Monitor for S/Sx of bleeding -Trend  CBC -Continue Eliquis for Anticoagulation/VTE Prophylaxis  -Transfuse for Hgb <7  #T2DM  -CBG's q4h; Target range of 140 to 180 -SSI -Follow ICU Hypo/Hyperglycemia protocol    Best Practice (right click and "Reselect all SmartList Selections" daily)   Diet/type: Regular consistency (see orders) DVT prophylaxis: DOAC GI prophylaxis: N/A Lines: N/A Foley:  N/A Code Status:  full code Last date of multidisciplinary goals of care discussion [7/8]  7/8: Pt updated at bedside, and pt's son Mariel Craft updated via telephone.  All questions answered.  Labs   CBC: Recent Labs  Lab 06/07/23 1254 06/08/23 0726  WBC 11.5* 12.4*  HGB 12.4 12.6  HCT 41.0 40.2  MCV 102.0* 98.8  PLT 146* 138*    Basic Metabolic Panel: Recent Labs  Lab 06/07/23 1254 06/08/23 0726 06/09/23 0350  NA 133* 133* 131*  K 3.4* 4.1 3.8  CL 92* 92* 91*  CO2 27 25 23   GLUCOSE 130* 162* 219*  BUN 55* 62* 72*  CREATININE 7.76* 8.41* 9.89*  CALCIUM 7.7* 7.7* 7.2*  MG  --  2.0 1.8  PHOS  --  5.3* 6.0*   GFR: Estimated Creatinine Clearance: 5.8 mL/min (A) (by C-G formula based on SCr of 9.89 mg/dL (H)). Recent Labs  Lab 06/07/23 1254 06/08/23 0726  WBC 11.5* 12.4*  LATICACIDVEN 1.3  --     Liver Function Tests: No results for input(s): "AST", "ALT", "ALKPHOS", "BILITOT", "PROT", "ALBUMIN" in the last 168 hours. Recent Labs  Lab 06/07/23 1254  LIPASE 51   No results for input(s): "AMMONIA" in the last 168 hours.  ABG    Component Value Date/Time   PHART 7.43 05/21/2022 2300   PCO2ART 42 05/21/2022 2300   PO2ART 66 (L) 05/21/2022 2300   HCO3 27.9 05/21/2022 2300   TCO2 31 01/19/2014 1614   ACIDBASEDEF 1.0 01/19/2014 1238   O2SAT 95.2 05/21/2022 2300     Coagulation Profile: No results for input(s): "INR", "PROTIME" in the last 168 hours.  Cardiac Enzymes: No results for input(s): "CKTOTAL", "CKMB", "CKMBINDEX", "TROPONINI" in the last 168 hours.  HbA1C: Hemoglobin A1C   Date/Time Value Ref Range Status  06/12/2021 12:00 AM 6.6  Final   Hgb A1c MFr Bld  Date/Time Value Ref Range Status  06/07/2023 07:44 PM 5.8 (H) 4.8 - 5.6 % Final    Comment:    (NOTE) Pre diabetes:          5.7%-6.4%  Diabetes:              >6.4%  Glycemic control for   <7.0% adults with diabetes   09/29/2022 04:55 PM 5.2 4.8 - 5.6 % Final    Comment:    (  NOTE) Pre diabetes:          5.7%-6.4%  Diabetes:              >6.4%  Glycemic control for   <7.0% adults with diabetes     CBG: Recent Labs  Lab 06/08/23 1554 06/08/23 1935 06/08/23 2321 06/09/23 0350 06/09/23 0724  GLUCAP 213* 167* 176* 202* 179*    Review of Systems:   Positives in BOLD: Gen: Denies fever, chills, weight change, fatigue, night sweats HEENT: Denies blurred vision, double vision, hearing loss, tinnitus, sinus congestion, rhinorrhea, sore throat, neck stiffness, dysphagia PULM: Denies shortness of breath, cough, sputum production, hemoptysis, wheezing CV: Denies chest pain, edema, orthopnea, paroxysmal nocturnal dyspnea, palpitations GI: Denies abdominal pain, nausea, vomiting, diarrhea, hematochezia, melena, constipation, change in bowel habits GU: Denies dysuria, hematuria, polyuria, oliguria, urethral discharge Endocrine: Denies hot or cold intolerance, polyuria, polyphagia or appetite change Derm: Denies rash, dry skin, scaling or peeling skin change Heme: Denies easy bruising, bleeding, bleeding gums Neuro: Denies headache, numbness, weakness, slurred speech, loss of memory or consciousness   Past Medical History:  She,  has a past medical history of Arthritis, CAD (coronary artery disease), Chronic lower back pain, Diverticulosis, ESRD on dialysis (HCC), GERD (gastroesophageal reflux disease), Hiatal hernia, History of echocardiogram, Hyperlipidemia, Hypertension, Morbid obesity (HCC), Obesity, Persistent atrial fibrillation (HCC), Pneumonia, and Type II diabetes mellitus (HCC).    Surgical History:   Past Surgical History:  Procedure Laterality Date   ABDOMINAL HYSTERECTOMY     AV FISTULA PLACEMENT Left 01/27/2014   Procedure: ARTERIOVENOUS (AV) FISTULA CREATION;  Surgeon: Pryor Ochoa, MD;  Location: [facility] OR;  Service: Vascular;  Laterality: Left;   AV FISTULA REPAIR     "had it cleaned out"   CARDIAC CATHETERIZATION  ?1980s   CARDIAC CATHETERIZATION N/A 12/04/2016   Procedure: Left Heart Cath Angiography;  Surgeon: Kathleene Hazel, MD;  Location: [facility] INVASIVE CV LAB;  Service: Cardiovascular;  Laterality: N/A;   CARDIAC CATHETERIZATION N/A 12/04/2016   Procedure: Coronary Stent Intervention;  Surgeon: Kathleene Hazel, MD;  Location: [facility] INVASIVE CV LAB;  Service: Cardiovascular;  Laterality: N/A;  Mid RCA   CATARACT EXTRACTION W/ INTRAOCULAR LENS IMPLANT Left    COLONOSCOPY W/ BIOPSIES AND POLYPECTOMY     CORONARY ANGIOPLASTY WITH STENT PLACEMENT  12/04/2016   ESOPHAGOGASTRODUODENOSCOPY     INSERTION OF DIALYSIS CATHETER N/A 01/19/2014   Procedure: INSERTION OF DIALYSIS CATHETER;  Surgeon: Larina Earthly, MD;  Location: [facility] OR;  Service: Vascular;  Laterality: N/A;     Social History:   reports that she quit smoking about 39 years ago. Her smoking use included cigarettes. She has a 20.00 pack-year smoking history. She has never used smokeless tobacco. She reports that she does not drink alcohol and does not use drugs.   Family History:  Her family history includes Breast cancer in her sister; CAD in her father and sister; Cancer in her brother; Diabetes in her mother; Heart attack in her father; Heart disease in her father; Hypertension in her father; Peripheral vascular disease in her mother.   Allergies Allergies  Allergen Reactions   Other Other (See Comments)   Sulfa Antibiotics Other (See Comments)   Sulfa Drugs Cross Reactors Other (See Comments)    Doesn't remember      Home Medications  Prior to Admission medications   Medication Sig  Start Date End Date Taking? Authorizing Provider  acetaminophen (TYLENOL) 650 MG CR tablet Take 1,300 mg by  mouth every 8 (eight) hours as needed for pain.    [provider]  albuterol (VENTOLIN HFA) 108 (90 Base) MCG/ACT inhaler Inhale 1-2 puffs into the lungs every 6 (six) hours as needed for wheezing or shortness of breath. 12/15/21   Franne Forts, DO  apixaban (ELIQUIS) 5 MG TABS tablet Take 1 tablet (5 mg total) by mouth 2 (two) times daily. 10/02/22   Tresa Moore, MD  B Complex-C-Zn-Folic Acid (DIALYVITE/ZINC) TABS Take 1 tablet by mouth daily.    [provider]  bisacodyl (DULCOLAX) 5 MG EC tablet Take 2 tablets (10 mg total) by mouth daily as needed for moderate constipation. 10/02/22   Tresa Moore, MD  cinacalcet (SENSIPAR) 30 MG tablet Take 30 mg by mouth daily with supper.    [provider]  ezetimibe (ZETIA) 10 MG tablet Take 1 tablet (10 mg total) by mouth daily. 04/30/22   Iran Ouch, MD  ferric citrate (AURYXIA) 1 GM 210 MG(Fe) tablet Take 420 mg by mouth 3 (three) times daily with meals.    [provider]  fexofenadine (ALLEGRA) 180 MG tablet Take 180 mg by mouth daily.    [provider]  midodrine (PROAMATINE) 10 MG tablet Take 20 mg by mouth daily. (Take additional two tablets [20mg ] for administration at dialysis on Tuesday, Thursday and Saturday)    [provider]  midodrine (PROAMATINE) 10 MG tablet Take 20 mg by mouth daily as needed (for SBP <100).    [provider]  Omega-3 Fatty Acids (FISH OIL) 1000 MG CPDR Take 1,000 mg by mouth daily.    [provider]  omeprazole (PRILOSEC) 20 MG capsule Take 1 capsule (20 mg total) by mouth daily. Patient taking differently: Take 20 mg by mouth 2 (two) times daily. 02/21/22   Arnette Felts, FNP  ondansetron (ZOFRAN-ODT) 4 MG disintegrating tablet Take 4 mg by mouth every 8 (eight) hours as needed for nausea or vomiting.    [provider]  polyethylene glycol (MIRALAX / GLYCOLAX) 17 g packet Take 17 g by mouth at bedtime.    [provider]  pregabalin (LYRICA) 100 MG capsule Take 100 mg by mouth at bedtime.    [provider]  pregabalin (LYRICA) 50 MG capsule Take 50 mg by mouth 2 (two) times daily.    [provider]  rosuvastatin (CRESTOR) 10 MG tablet Take 1 tablet (10 mg total) by mouth daily. 10/03/22   Tresa Moore, MD  sennosides-docusate sodium (SENOKOT-S) 8.6-50 MG tablet Take 1 tablet by mouth 2 (two) times daily.    [provider]  trolamine salicylate (ASPERCREME) 10 % cream Apply 1 Application topically 2 (two) times daily. (Apply to knees)    [provider]     Critical care time: 40 minutes     Harlon Ditty, AGACNP-BC [facility] Prefer epic messenger for cross cover needs If after hours, please call E-link

## 2023-06-09 NOTE — Progress Notes (Signed)
Rounding Note    Patient Name: Jacqueline [name] Date of Encounter: 06/09/2023  [facility] Cardiologist: Lorine Bears, MD   Subjective   Her heart rate improved but she continues to be mildly hypertensive.  She denies chest pain or shortness of breath.  No dizziness.  Inpatient Medications    Scheduled Meds:  Chlorhexidine Gluconate Cloth  6 each Topical Daily   cinacalcet  30 mg Oral Q supper   ezetimibe  10 mg Oral Daily   feeding supplement (NEPRO CARB STEADY)  237 mL Oral TID BM   ferric citrate  420 mg Oral TID WC   fludrocortisone  0.1 mg Oral Daily   heparin  4,500 Units Intravenous Once   insulin aspart  0-6 Units Subcutaneous Q4H   loratadine  10 mg Oral Daily   midodrine  10 mg Oral TID WC   multivitamin  1 tablet Oral QHS   pregabalin  100 mg Oral QHS   pregabalin  50 mg Oral BID   rosuvastatin  10 mg Oral Daily   senna-docusate  1 tablet Oral BID   Continuous Infusions:  sodium chloride 10 mL/hr at 06/09/23 1306   anticoagulant sodium citrate     heparin     norepinephrine (LEVOPHED) Adult infusion 6 mcg/min (06/09/23 1306)   PRN Meds: alteplase, anticoagulant sodium citrate, bisacodyl, docusate sodium, heparin, lidocaine (PF), lidocaine-prilocaine, ondansetron (ZOFRAN) IV, pentafluoroprop-tetrafluoroeth, polyethylene glycol   Vital Signs    Vitals:   06/09/23 1200 06/09/23 1215 06/09/23 1230 06/09/23 1300  BP: (!) 83/74 (!) 95/33 (!) 59/50 (!) 109/98  Pulse: 69 72 72 79  Resp: 14 17 16 17   Temp: 98.6 F (37 C)     TempSrc: Oral     SpO2: 94% 95% (!) 87% 98%  Weight:      Height:        Intake/Output Summary (Last 24 hours) at 06/09/2023 1432 Last data filed at 06/09/2023 1306 Gross per 24 hour  Intake 2203.39 ml  Output 0 ml  Net 2203.39 ml      06/09/2023    5:00 AM 06/08/2023    4:39 AM 06/07/2023   11:00 PM  Last 3 Weights  Weight (lbs) 254 lb 10.1 oz 254 lb 13.6 oz 254 lb 13.6 oz  Weight (kg) 115.5 kg 115.6 kg 115.6 kg       Telemetry    Atrial fibrillation with slow ventricular rate- Personally Reviewed  ECG     - Personally Reviewed  Physical Exam   GEN: No acute distress.   Neck: No JVD Cardiac: Irregularly irregular no murmurs, rubs, or gallops.  Respiratory: Clear to auscultation bilaterally. GI: Soft, nontender, non-distended  MS: No edema; No deformity. Neuro:  Nonfocal  Psych: Normal affect   Labs    High Sensitivity Troponin:  No results for input(s): "TROPONINIHS" in the last 720 hours.   Chemistry Recent Labs  Lab 06/07/23 1254 06/08/23 0726 06/09/23 0350  NA 133* 133* 131*  K 3.4* 4.1 3.8  CL 92* 92* 91*  CO2 27 25 23   GLUCOSE 130* 162* 219*  BUN 55* 62* 72*  CREATININE 7.76* 8.41* 9.89*  CALCIUM 7.7* 7.7* 7.2*  MG  --  2.0 1.8  GFRNONAA 5* 4* 4*  ANIONGAP 14 16* 17*    Lipids No results for input(s): "CHOL", "TRIG", "HDL", "LABVLDL", "LDLCALC", "CHOLHDL" in the last 168 hours.  Hematology Recent Labs  Lab 06/07/23 1254 06/08/23 0726  WBC 11.5* 12.4*  RBC 4.02  4.07  HGB 12.4 12.6  HCT 41.0 40.2  MCV 102.0* 98.8  MCH 30.8 31.0  MCHC 30.2 31.3  RDW 15.7* 15.5  PLT 146* 138*   Thyroid  Recent Labs  Lab 06/07/23 1254  TSH 1.497  1.491  FREET4 0.95    BNPNo results for input(s): "BNP", "PROBNP" in the last 168 hours.  DDimer No results for input(s): "DDIMER" in the last 168 hours.   Radiology    ECHOCARDIOGRAM COMPLETE  Result Date: 06/08/2023    ECHOCARDIOGRAM REPORT   Patient Name:   AZERIA ALEO Date of Exam: 06/08/2023 Medical Rec #:  [id number]         Height:       64.0 in Accession #:    [id number]        Weight:       254.9 lb Date of Birth:  [birth year]          BSA:          2.169 m Patient Age:    77 years          BP:           100/59 mmHg Patient Gender: F                 HR:           63 bpm. Exam Location:  [facility] Procedure: 2D Echo, Color Doppler and Cardiac Doppler Indications:     CHF I50.31  History:         Patient has prior history of  Echocardiogram examinations, most                  recent 09/09/2022. CHF, CAD, ESRD, Signs/Symptoms:Chest Pain;                  Risk Factors:Dyslipidemia, Diabetes, Hypertension and Former                  Smoker.  Sonographer:     Dondra Prader RVT RCS Referring Phys:  [id number] Hubbard Hartshorn OUMA Diagnosing Phys: Julien Nordmann MD  Sonographer Comments: Patient is obese. Image acquisition challenging due to patient body habitus. IMPRESSIONS  1. Left ventricular ejection fraction, by estimation, is 60 to 65%. The left ventricle has normal function. The left ventricle has no regional wall motion abnormalities. Left ventricular diastolic parameters are indeterminate.  2. Right ventricular systolic function is moderately reduced. The right ventricular size is moderately enlarged. There is severely elevated pulmonary artery systolic pressure. The estimated right ventricular systolic pressure is 79.0 mmHg.  3. Left atrial size was mildly dilated.  4. The mitral valve is normal in structure. Mild mitral valve regurgitation. No evidence of mitral stenosis.  5. Tricuspid valve regurgitation is moderate.  6. The aortic valve is normal in structure. Aortic valve regurgitation is not visualized. Aortic valve sclerosis is present, with no evidence of aortic valve stenosis.  7. The inferior vena cava is dilated in size with <50% respiratory variability, suggesting right atrial pressure of 15 mmHg. FINDINGS  Left Ventricle: Left ventricular ejection fraction, by estimation, is 60 to 65%. The left ventricle has normal function. The left ventricle has no regional wall motion abnormalities. The left ventricular internal cavity size was normal in size. There is  no left ventricular hypertrophy. Left ventricular diastolic parameters are indeterminate. Right Ventricle: The right ventricular size is moderately enlarged. No increase in right ventricular wall thickness. Right ventricular systolic function is moderately reduced. There is  severely  elevated pulmonary artery systolic pressure. The tricuspid regurgitant velocity is 4.00 m/s, and with an assumed right atrial pressure of 15 mmHg, the estimated right ventricular systolic pressure is 79.0 mmHg. Left Atrium: Left atrial size was mildly dilated. Right Atrium: Right atrial size was normal in size. Pericardium: There is no evidence of pericardial effusion. Mitral Valve: The mitral valve is normal in structure. Mild mitral valve regurgitation. No evidence of mitral valve stenosis. Tricuspid Valve: The tricuspid valve is normal in structure. Tricuspid valve regurgitation is moderate . No evidence of tricuspid stenosis. Aortic Valve: The aortic valve is normal in structure. Aortic valve regurgitation is not visualized. Aortic valve sclerosis is present, with no evidence of aortic valve stenosis. Aortic valve mean gradient measures 6.0 mmHg. Aortic valve peak gradient measures 10.5 mmHg. Aortic valve area, by VTI measures 1.88 cm. Pulmonic Valve: The pulmonic valve was normal in structure. Pulmonic valve regurgitation is trivial. No evidence of pulmonic stenosis. Aorta: The aortic root is normal in size and structure. Venous: The inferior vena cava is dilated in size with less than 50% respiratory variability, suggesting right atrial pressure of 15 mmHg. IAS/Shunts: No atrial level shunt detected by color flow Doppler.  LEFT VENTRICLE PLAX 2D LVIDd:         5.00 cm   Diastology LVIDs:         3.20 cm   LV e' medial:    6.81 cm/s LV PW:         0.90 cm   LV E/e' medial:  14.8 LV IVS:        1.30 cm   LV e' lateral:   9.32 cm/s LVOT diam:     1.80 cm   LV E/e' lateral: 10.8 LV SV:         58 LV SV Index:   27 LVOT Area:     2.54 cm  RIGHT VENTRICLE             IVC RV Basal diam:  4.30 cm     IVC diam: 2.00 cm RV Mid diam:    4.60 cm RV S prime:     12.10 cm/s TAPSE (M-mode): 1.6 cm LEFT ATRIUM             Index        RIGHT ATRIUM           Index LA diam:        4.60 cm 2.12 cm/m   RA Area:      19.60 cm LA Vol (A2C):   78.1 ml 36.01 ml/m  RA Volume:   56.40 ml  26.01 ml/m LA Vol (A4C):   56.3 ml 25.96 ml/m LA Biplane Vol: 71.6 ml 33.02 ml/m  AORTIC VALVE                     PULMONIC VALVE AV Area (Vmax):    1.59 cm      PV Vmax:       0.93 m/s AV Area (Vmean):   1.54 cm      PV Peak grad:  3.4 mmHg AV Area (VTI):     1.88 cm AV Vmax:           162.00 cm/s AV Vmean:          112.500 cm/s AV VTI:            0.310 m AV Peak Grad:      10.5 mmHg AV Mean Grad:  6.0 mmHg LVOT Vmax:         101.00 cm/s LVOT Vmean:        68.100 cm/s LVOT VTI:          0.229 m LVOT/AV VTI ratio: 0.74  AORTA Ao Root diam: 2.60 cm Ao Asc diam:  3.00 cm MITRAL VALVE                TRICUSPID VALVE MV Area (PHT): 3.31 cm     TR Peak grad:   64.0 mmHg MV Decel Time: 229 msec     TR Mean grad:   39.0 mmHg MV E velocity: 101.00 cm/s  TR Vmax:        400.00 cm/s                             TR Vmean:       291.0 cm/s                              SHUNTS                             Systemic VTI:  0.23 m                             Systemic Diam: 1.80 cm Julien Nordmann MD Electronically signed by Julien Nordmann MD Signature Date/Time: 06/08/2023/3:07:42 PM    Final    DG Chest Portable 1 View  Result Date: 06/07/2023 CLINICAL DATA:  Sepsis.  Weakness. EXAM: PORTABLE CHEST 1 VIEW COMPARISON:  09/28/2022. FINDINGS: Stable heart size.The cardiomediastinal contours are unchanged with aortic atherosclerosis. The lungs are clear. Pulmonary vasculature is normal. No consolidation, pleural effusion, or pneumothorax. No acute osseous abnormalities are seen. IMPRESSION: No acute chest findings. Electronically Signed   By: Narda Rutherford M.D.   On: 06/07/2023 15:01    Cardiac Studies   I personally reviewed her echocardiogram which was done yesterday and showed normal LV systolic function, moderately enlarged right ventricle with moderately reduced RV systolic function, moderate to severe tricuspid regurgitation and evidence of severe  pulmonary hypertension with systolic pulmonary pressure of 80 mmHg.  Patient Profile     77 y.o. female with a history of CAD status post RCA stenting, end-stage renal disease on hemodialysis, hypertension, hypotension, hyperlipidemia, bradycardia, persistent atrial fibrillation, type 2 diabetes mellitus, peripheral arterial disease, sleep apnea, and obesity who is being seen today for the evaluation of slow A-fib and hypertension at the request of Dr. Aundria Rud.   Assessment & Plan    1.  Hypertension and spite of midodrine.  Mostly during dialysis.  The patient has evidence of severe pulmonary hypertension and RV dysfunction which likely make it extremely difficult to dialyze this patient with effective fluid removal. Not entirely sure if she is a candidate for a different form of renal replacement therapy.  2.  Bradycardia: Might be contributing to hypotension but she was hypotensive today even when her heart rate was in the normal range.  The patient is being evaluated for a pacemaker placement.  3.  Severe pulmonary hypertension: Unfortunately, given that she is on dialysis, she is likely not a candidate for pulmonary hypertension treatments.  4.  Moderate-severe tricuspid regurgitation:: Not a good candidate for valve repair or clip given degree of pulmonary hypertension and  RV dysfunction.    Total encounter time more than 50 minutes. Greater than 50% was spent in counseling and coordination of care with the patient.   For questions or updates, please contact [facility] Please consult [email or web address] for contact info under        Signed, Lorine Bears, MD  06/09/2023, 2:32 PM

## 2023-06-09 NOTE — TOC Progression Note (Signed)
Transition of Care [facility]) - Progression Note    Patient Details  Name: Jacqueline [name] MRN: [id number] Date of Birth: [birth year]  [facility] CM/SW Contact  Kreg Shropshire, RN Phone Number: 06/09/2023, 1:10 PM  Clinical Narrative:    Cm reached out to Tammy of [facility] to confirm if pt can go back once d/c. Awaiting to hear back. FL2 completed.   Expected Discharge Plan: Long Term Nursing Home Barriers to Discharge: Continued Medical Work up  Expected Discharge Plan and Services       Living arrangements for the past 2 months: Skilled Nursing Facility                                       Social Determinants of Health (SDOH) Interventions SDOH Screenings   Food Insecurity: No Food Insecurity (06/07/2023)  Housing: Low Risk  (06/07/2023)  Transportation Needs: No Transportation Needs (06/07/2023)  Utilities: Not At Risk (06/07/2023)  Depression (PHQ2-9): Low Risk  (06/27/2021)  Financial Resource Strain: Medium Risk (06/27/2021)  Physical Activity: Inactive (06/27/2021)  Stress: No Stress Concern Present (06/27/2021)  Tobacco Use: Medium Risk (06/08/2023)    Readmission Risk Interventions    05/22/2022   12:26 PM  Readmission Risk Prevention Plan  Transportation Screening Complete  PCP or Specialist Appt within 3-5 Days Complete  HRI or Home Care Consult Complete  Social Work Consult for Recovery Care Planning/Counseling Complete  Palliative Care Screening Complete  Medication Review Oceanographer) Complete

## 2023-06-09 NOTE — Consult Note (Signed)
[facility] CONSULT NOTE    Date: 06/09/2023                  Patient Name:  Jacqueline [name]  MRN: [id number]  DOB: 09-23-1946  Age / Sex: 77 y.o., female         PCP: Macky Lower, MD                 Service Requesting Consult: Critical care                 Reason for Consult: Evaluation management of ESRD            History of Present Illness: Patient is a 77 y.o. female with a PMHx of ESRD on HD TTS at [facility], diabetes mellitus type 2, chronic hypotension, peripheral arterial disease, obstructive sleep apnea, GERD, obesity, hyperlipidemia, anemia chronic kidney disease, secondary hyperparathyroidism, who was admitted to [facility] on 06/07/2023 for evaluation of generalized weakness.  When she arrived here she was found to have significantly low blood pressure of 81/51.  She is on midodrine 10 mg 3 times daily as an outpatient for this issue.  She also had some periods of bradycardia and has been seen by cardiology for this.  We did have a chance to discuss patient's care with her outpatient nephrologist.  It appears that hypotension has been chronic in nature and as above has been on midodrine 10 mg 3 times daily.  Patient is asymptomatic from this hypotension.  Systolic blood pressure was as low as 61 when seen in the critical care unit this a.m.  Earlier this a.m. the patient was on Levophed but titrated off at the moment.   Medications: Outpatient medications: Medications Prior to Admission  Medication Sig Dispense Refill Last Dose   acetaminophen (TYLENOL) 650 MG CR tablet Take 1,300 mg by mouth every 8 (eight) hours as needed for pain.      albuterol (VENTOLIN HFA) 108 (90 Base) MCG/ACT inhaler Inhale 1-2 puffs into the lungs every 6 (six) hours as needed for wheezing or shortness of breath. 1 each 0    apixaban (ELIQUIS) 5 MG TABS tablet Take 1 tablet (5 mg total) by mouth 2 (two) times daily. 60 tablet     B Complex-C-Zn-Folic Acid  (DIALYVITE/ZINC) TABS Take 1 tablet by mouth daily.      bisacodyl (DULCOLAX) 5 MG EC tablet Take 2 tablets (10 mg total) by mouth daily as needed for moderate constipation. 30 tablet 0    cinacalcet (SENSIPAR) 30 MG tablet Take 30 mg by mouth daily with supper.      ezetimibe (ZETIA) 10 MG tablet Take 1 tablet (10 mg total) by mouth daily. 30 tablet 5    ferric citrate (AURYXIA) 1 GM 210 MG(Fe) tablet Take 420 mg by mouth 3 (three) times daily with meals.      fexofenadine (ALLEGRA) 180 MG tablet Take 180 mg by mouth daily.      midodrine (PROAMATINE) 10 MG tablet Take 20 mg by mouth daily. (Take additional two tablets [20mg ] for administration at dialysis on Tuesday, Thursday and Saturday)      midodrine (PROAMATINE) 10 MG tablet Take 20 mg by mouth daily as needed (for SBP <100).      Omega-3 Fatty Acids (FISH OIL) 1000 MG CPDR Take 1,000 mg by mouth daily.      omeprazole (PRILOSEC) 20 MG capsule Take 1 capsule (20 mg total) by mouth daily. (Patient taking differently:  Take 20 mg by mouth 2 (two) times daily.) 90 capsule 1    ondansetron (ZOFRAN-ODT) 4 MG disintegrating tablet Take 4 mg by mouth every 8 (eight) hours as needed for nausea or vomiting.      polyethylene glycol (MIRALAX / GLYCOLAX) 17 g packet Take 17 g by mouth at bedtime.      pregabalin (LYRICA) 100 MG capsule Take 100 mg by mouth at bedtime.      pregabalin (LYRICA) 50 MG capsule Take 50 mg by mouth 2 (two) times daily.      rosuvastatin (CRESTOR) 10 MG tablet Take 1 tablet (10 mg total) by mouth daily.      sennosides-docusate sodium (SENOKOT-S) 8.6-50 MG tablet Take 1 tablet by mouth 2 (two) times daily.      trolamine salicylate (ASPERCREME) 10 % cream Apply 1 Application topically 2 (two) times daily. (Apply to knees)       Current medications: Current Facility-Administered Medications  Medication Dose Route Frequency Provider Last Rate Last Admin   0.9 %  sodium chloride infusion  250 mL Intravenous Continuous Sharen Hones, RPH 10 mL/hr at 06/09/23 1610 Infusion Verify at 06/09/23 0949   alteplase (CATHFLO ACTIVASE) injection 2 mg  2 mg Intracatheter Once PRN Wendee Beavers, NP       anticoagulant sodium citrate solution 5 mL  5 mL Intracatheter PRN Wendee Beavers, NP       apixaban (ELIQUIS) tablet 5 mg  5 mg Oral BID Jimmye Norman, NP   5 mg at 06/08/23 2118   bisacodyl (DULCOLAX) EC tablet 10 mg  10 mg Oral Daily PRN Jimmye Norman, NP       Chlorhexidine Gluconate Cloth 2 % PADS 6 each  6 each Topical Daily Jimmye Norman, NP   6 each at 06/09/23 0850   cinacalcet (SENSIPAR) tablet 30 mg  30 mg Oral Q supper Jimmye Norman, NP   30 mg at 06/08/23 1609   docusate sodium (COLACE) capsule 100 mg  100 mg Oral BID PRN Jimmye Norman, NP       ezetimibe (ZETIA) tablet 10 mg  10 mg Oral Daily Jimmye Norman, NP   10 mg at 06/09/23 0845   ferric citrate (AURYXIA) tablet 420 mg  420 mg Oral TID WC Jimmye Norman, NP   420 mg at 06/09/23 0846   fludrocortisone (FLORINEF) tablet 0.1 mg  0.1 mg Oral Daily Jimmye Norman, NP   0.1 mg at 06/09/23 0845   heparin injection 1,000 Units  1,000 Units Intracatheter PRN Wendee Beavers, NP       insulin aspart (novoLOG) injection 0-6 Units  0-6 Units Subcutaneous Q4H Jimmye Norman, NP   1 Units at 06/09/23 0845   lidocaine (PF) (XYLOCAINE) 1 % injection 5 mL  5 mL Intradermal PRN Wendee Beavers, NP       lidocaine-prilocaine (EMLA) cream 1 Application  1 Application Topical PRN Wendee Beavers, NP       loratadine (CLARITIN) tablet 10 mg  10 mg Oral Daily Jimmye Norman, NP   10 mg at 06/09/23 0846   midodrine (PROAMATINE) tablet 10 mg  10 mg Oral TID WC Harlon Ditty D, NP   10 mg at 06/09/23 0846   norepinephrine (LEVOPHED) 4mg  in (0.016 mg/mL) premix infusion  2-10 mcg/min Intravenous Titrated Sharen Hones, RPH 30 mL/hr at 06/09/23 0949 8 mcg/min at  06/09/23 0949   ondansetron (ZOFRAN) injection 4 mg  4 mg Intravenous Q6H PRN Jimmye Norman, NP   4 mg at 06/08/23 1606   pentafluoroprop-tetrafluoroeth (GEBAUERS) aerosol 1 Application  1 Application Topical PRN Wendee Beavers, NP       polyethylene glycol (MIRALAX / GLYCOLAX) packet 17 g  17 g Oral Daily PRN Jimmye Norman, NP       pregabalin (LYRICA) capsule 100 mg  100 mg Oral QHS Jimmye Norman, NP   100 mg at 06/08/23 0257   pregabalin (LYRICA) capsule 50 mg  50 mg Oral BID Jimmye Norman, NP   50 mg at 06/09/23 0846   rosuvastatin (CRESTOR) tablet 10 mg  10 mg Oral Daily Jimmye Norman, NP   10 mg at 06/09/23 0845   senna-docusate (Senokot-S) tablet 1 tablet  1 tablet Oral BID Jimmye Norman, NP   1 tablet at 06/09/23 0845      Allergies: Allergies  Allergen Reactions   Other Other (See Comments)   Sulfa Antibiotics Other (See Comments)   Sulfa Drugs Cross Reactors Other (See Comments)    Doesn't remember       Past Medical History: Past Medical History:  Diagnosis Date   Arthritis    "left knee" (12/04/2016)   CAD (coronary artery disease)    a. 12/2016 NSTEMI/PCI: LM nl, LaD 20p/d, D1 20, OM2 20, OM3 20, RCA 28m (3.0x18 Resolute DES). EF 65%.   Chronic lower back pain    Diverticulosis    on CT   ESRD on dialysis [facility])    GERD (gastroesophageal reflux disease)    Hiatal hernia    History of echocardiogram    a. 09/2022 Echo: EF 60-65%, mild asymm LVH, mod reduced RV fxn, RVSP . Mildly dil RA. Mild AI. Ao sclerosis. Mild MR.   Hyperlipidemia    Hypertension    a. 12/2016 labetalol d/c'd 2/2 hypotension in HD.   Morbid obesity (HCC)    Obesity    Persistent atrial fibrillation (HCC)    a. 05/2023 Zio: 100% afib avg HR 52 (32-99).   Pneumonia    "couple times" (12/04/2016)   Type II diabetes mellitus (HCC)      Past Surgical History: Past Surgical History:  Procedure Laterality Date   ABDOMINAL  HYSTERECTOMY     AV FISTULA PLACEMENT Left 01/27/2014   Procedure: ARTERIOVENOUS (AV) FISTULA CREATION;  Surgeon: Pryor Ochoa, MD;  Location: [facility] OR;  Service: Vascular;  Laterality: Left;   AV FISTULA REPAIR     "had it cleaned out"   CARDIAC CATHETERIZATION  ?1980s   CARDIAC CATHETERIZATION N/A 12/04/2016   Procedure: Left Heart Cath Angiography;  Surgeon: Kathleene Hazel, MD;  Location: [facility] INVASIVE CV LAB;  Service: Cardiovascular;  Laterality: N/A;   CARDIAC CATHETERIZATION N/A 12/04/2016   Procedure: Coronary Stent Intervention;  Surgeon: Kathleene Hazel, MD;  Location: [facility] INVASIVE CV LAB;  Service: Cardiovascular;  Laterality: N/A;  Mid RCA   CATARACT EXTRACTION W/ INTRAOCULAR LENS IMPLANT Left    COLONOSCOPY W/ BIOPSIES AND POLYPECTOMY     CORONARY ANGIOPLASTY WITH STENT PLACEMENT  12/04/2016   ESOPHAGOGASTRODUODENOSCOPY     INSERTION OF DIALYSIS CATHETER N/A 01/19/2014   Procedure: INSERTION OF DIALYSIS CATHETER;  Surgeon: Larina Earthly, MD;  Location: [facility] OR;  Service: Vascular;  Laterality: N/A;     Family History: Family History  Problem Relation Age of Onset   Diabetes Mother    Peripheral vascular disease Mother        amputation  CAD Father    Heart disease Father    Hypertension Father    Heart attack Father    CAD Sister    Cancer Brother        x 2 brothers, type unknown   Breast cancer Sister      Social History: Social History   Socioeconomic History   Marital status: Widowed    Spouse name: Not on file   Number of children: 4   Years of education: Not on file   Highest education level: Not on file  Occupational History   Occupation: retired  Tobacco Use   Smoking status: Former    Packs/day: 1.00    Years: 20.00    Additional pack years: 0.00    Total pack years: 20.00    Types: Cigarettes    Quit date: 1985    Years since quitting: 39.5   Smokeless tobacco: Never  Vaping Use   Vaping Use: Never used  Substance and Sexual  Activity   Alcohol use: No    Alcohol/week: 0.0 standard drinks of alcohol   Drug use: No   Sexual activity: Not Currently  Other Topics Concern   Not on file  Social History Narrative   Not on file   Social Determinants of Health   Financial Resource Strain: Medium Risk (06/27/2021)   Overall Financial Resource Strain (CARDIA)    Difficulty of Paying Living Expenses: Somewhat hard  Food Insecurity: No Food Insecurity (06/07/2023)   Hunger Vital Sign    Worried About Running Out of Food in the Last Year: Never true    Ran Out of Food in the Last Year: Never true  Transportation Needs: No Transportation Needs (06/07/2023)   PRAPARE - Administrator, Civil Service (Medical): No    Lack of Transportation (Non-Medical): No  Physical Activity: Inactive (06/27/2021)   Exercise Vital Sign    Days of Exercise per Week: 0 days    Minutes of Exercise per Session: 0 min  Stress: No Stress Concern Present (06/27/2021)   [employer] of Occupational Health - Occupational Stress Questionnaire    Feeling of Stress : Not at all  Social Connections: Not on file  Intimate Partner Violence: Not At Risk (06/07/2023)   Humiliation, Afraid, Rape, and Kick questionnaire    Fear of Current or Ex-Partner: No    Emotionally Abused: No    Physically Abused: No    Sexually Abused: No     Review of Systems: Review of Systems  Constitutional:  Positive for malaise/fatigue. Negative for chills and fever.  HENT:  Negative for congestion, hearing loss and tinnitus.   Eyes:  Negative for blurred vision and double vision.  Respiratory:  Negative for cough, sputum production and shortness of breath.   Cardiovascular:  Negative for chest pain, palpitations and orthopnea.  Gastrointestinal:  Negative for diarrhea, nausea and vomiting.  Genitourinary:  Negative for dysuria, frequency and urgency.  Musculoskeletal:  Negative for myalgias.  Skin:  Negative for itching and rash.  Neurological:   Negative for dizziness and focal weakness.  Endo/Heme/Allergies:  Negative for polydipsia. Does not bruise/bleed easily.  Psychiatric/Behavioral:  The patient is not nervous/anxious.      Vital Signs: Blood pressure (!) 128/109, pulse 91, temperature 98.7 F (37.1 C), temperature source Axillary, resp. rate 17, height 5\' 4"  (1.626 m), weight 115.5 kg, SpO2 99 %.  Weight trends: Filed Weights   06/07/23 2300 06/08/23 0439 06/09/23 0500  Weight: 115.6 kg 115.6 kg 115.5  kg     Physical Exam: General: No acute distress  Head: Normocephalic, atraumatic. Moist oral mucosal membranes  Eyes: Anicteric  Neck: Supple  Lungs:  Clear to auscultation, normal effort  Heart: S1S2 no rubs  Abdomen:  Soft, nontender, bowel sounds present  Extremities: Trace peripheral edema.  Neurologic: Awake, alert, following commands  Skin: No acute rash  Access: Left upper extremity AV access    Lab results: Basic Metabolic Panel: Recent Labs  Lab 06/07/23 1254 06/08/23 0726 06/09/23 0350  NA 133* 133* 131*  K 3.4* 4.1 3.8  CL 92* 92* 91*  CO2 27 25 23   GLUCOSE 130* 162* 219*  BUN 55* 62* 72*  CREATININE 7.76* 8.41* 9.89*  CALCIUM 7.7* 7.7* 7.2*  MG  --  2.0 1.8  PHOS  --  5.3* 6.0*    Liver Function Tests: No results for input(s): "AST", "ALT", "ALKPHOS", "BILITOT", "PROT", "ALBUMIN" in the last 168 hours. Recent Labs  Lab 06/07/23 1254  LIPASE 51   No results for input(s): "AMMONIA" in the last 168 hours.  CBC: Recent Labs  Lab 06/07/23 1254 06/08/23 0726  WBC 11.5* 12.4*  HGB 12.4 12.6  HCT 41.0 40.2  MCV 102.0* 98.8  PLT 146* 138*    Cardiac Enzymes: No results for input(s): "CKTOTAL", "CKMB", "CKMBINDEX", "TROPONINI" in the last 168 hours.  BNP: Invalid input(s): "POCBNP"  CBG: Recent Labs  Lab 06/08/23 1935 06/08/23 2321 06/09/23 0350 06/09/23 0724 06/09/23 1113  GLUCAP 167* 176* 202* 179* 114*    Microbiology: Results for orders placed or performed  during the hospital encounter of 06/07/23  Blood culture (routine x 2)     Status: None (Preliminary result)   Collection Time: 06/07/23 12:54 PM   Specimen: BLOOD  Result Value Ref Range Status   Specimen Description BLOOD RIGHT ANTECUBITAL  Final   Special Requests   Final    BOTTLES DRAWN AEROBIC AND ANAEROBIC Blood Culture adequate volume   Culture   Final    NO GROWTH 2 DAYS Performed at [facility], [address] [place], [place] [address]    Report Status PENDING  Incomplete  Blood culture (routine x 2)     Status: None (Preliminary result)   Collection Time: 06/07/23 12:54 PM   Specimen: BLOOD  Result Value Ref Range Status   Specimen Description BLOOD BLOOD RIGHT FOREARM  Final   Special Requests   Final    BOTTLES DRAWN AEROBIC AND ANAEROBIC Blood Culture adequate volume   Culture   Final    NO GROWTH 2 DAYS Performed at [facility], [address], [place], [place] [address]    Report Status PENDING  Incomplete  MRSA Next Gen by PCR, Nasal     Status: None   Collection Time: 06/07/23 10:18 PM   Specimen: Nasal Mucosa; Nasal Swab  Result Value Ref Range Status   MRSA by PCR Next Gen NOT DETECTED NOT DETECTED Final    Comment: (NOTE) The GeneXpert MRSA Assay (FDA approved for NASAL specimens only), is one component of a comprehensive MRSA colonization surveillance program. It is not intended to diagnose MRSA infection nor to guide or monitor treatment for MRSA infections. Test performance is not FDA approved in patients less than 27 years old. Performed at [facility], [address] [place], [place] [address]     Coagulation Studies: No results for input(s): "LABPROT", "INR" in the last 72 hours.  Urinalysis: No results for input(s): "COLORURINE", "LABSPEC", "PHURINE", "GLUCOSEU", "  HGBUR", "BILIRUBINUR", "KETONESUR", "PROTEINUR", "UROBILINOGEN", "NITRITE", "LEUKOCYTESUR" in the last 72 hours.  Invalid input(s): "APPERANCEUR"     Imaging: ECHOCARDIOGRAM COMPLETE  Result Date: 06/08/2023    ECHOCARDIOGRAM REPORT   Patient Name:   NELSIE BERKE Date of Exam: 06/08/2023 Medical Rec #:  [id number]         Height:       64.0 in Accession #:    [id number]        Weight:       254.9 lb Date of Birth:  29-Apr-1946          BSA:          2.169 m Patient Age:    77 years          BP:           100/59 mmHg Patient Gender: F                 HR:           63 bpm. Exam Location:  [facility] Procedure: 2D Echo, Color Doppler and Cardiac Doppler Indications:     CHF I50.31  History:         Patient has prior history of Echocardiogram examinations, most                  recent 09/09/2022. CHF, CAD, ESRD, Signs/Symptoms:Chest Pain;                  Risk Factors:Dyslipidemia, Diabetes, Hypertension and Former                  Smoker.  Sonographer:     Dondra Prader RVT RCS Referring Phys:  [id number] Hubbard Hartshorn OUMA Diagnosing Phys: Julien Nordmann MD  Sonographer Comments: Patient is obese. Image acquisition challenging due to patient body habitus. IMPRESSIONS  1. Left ventricular ejection fraction, by estimation, is 60 to 65%. The left ventricle has normal function. The left ventricle has no regional wall motion abnormalities. Left ventricular diastolic parameters are indeterminate.  2. Right ventricular systolic function is moderately reduced. The right ventricular size is moderately enlarged. There is severely elevated pulmonary artery systolic pressure. The estimated right ventricular systolic pressure is 79.0 mmHg.  3. Left atrial size was mildly dilated.  4. The mitral valve is normal in structure. Mild mitral valve regurgitation. No evidence of mitral stenosis.  5. Tricuspid valve regurgitation is moderate.  6. The aortic valve is normal in structure. Aortic valve regurgitation is not visualized. Aortic valve sclerosis is present, with no evidence of aortic valve stenosis.  7. The inferior vena cava is dilated in size with <50% respiratory variability,  suggesting right atrial pressure of 15 mmHg. FINDINGS  Left Ventricle: Left ventricular ejection fraction, by estimation, is 60 to 65%. The left ventricle has normal function. The left ventricle has no regional wall motion abnormalities. The left ventricular internal cavity size was normal in size. There is  no left ventricular hypertrophy. Left ventricular diastolic parameters are indeterminate. Right Ventricle: The right ventricular size is moderately enlarged. No increase in right ventricular wall thickness. Right ventricular systolic function is moderately reduced. There is severely elevated pulmonary artery systolic pressure. The tricuspid regurgitant velocity is 4.00 m/s, and with an assumed right atrial pressure of 15 mmHg, the estimated right ventricular systolic pressure is 79.0 mmHg. Left Atrium: Left atrial size was mildly dilated. Right Atrium: Right atrial size was normal in size. Pericardium: There is no evidence of pericardial effusion. Mitral Valve: The  mitral valve is normal in structure. Mild mitral valve regurgitation. No evidence of mitral valve stenosis. Tricuspid Valve: The tricuspid valve is normal in structure. Tricuspid valve regurgitation is moderate . No evidence of tricuspid stenosis. Aortic Valve: The aortic valve is normal in structure. Aortic valve regurgitation is not visualized. Aortic valve sclerosis is present, with no evidence of aortic valve stenosis. Aortic valve mean gradient measures 6.0 mmHg. Aortic valve peak gradient measures 10.5 mmHg. Aortic valve area, by VTI measures 1.88 cm. Pulmonic Valve: The pulmonic valve was normal in structure. Pulmonic valve regurgitation is trivial. No evidence of pulmonic stenosis. Aorta: The aortic root is normal in size and structure. Venous: The inferior vena cava is dilated in size with less than 50% respiratory variability, suggesting right atrial pressure of 15 mmHg. IAS/Shunts: No atrial level shunt detected by color flow Doppler.   LEFT VENTRICLE PLAX 2D LVIDd:         5.00 cm   Diastology LVIDs:         3.20 cm   LV e' medial:    6.81 cm/s LV PW:         0.90 cm   LV E/e' medial:  14.8 LV IVS:        1.30 cm   LV e' lateral:   9.32 cm/s LVOT diam:     1.80 cm   LV E/e' lateral: 10.8 LV SV:         58 LV SV Index:   27 LVOT Area:     2.54 cm  RIGHT VENTRICLE             IVC RV Basal diam:  4.30 cm     IVC diam: 2.00 cm RV Mid diam:    4.60 cm RV S prime:     12.10 cm/s TAPSE (M-mode): 1.6 cm LEFT ATRIUM             Index        RIGHT ATRIUM           Index LA diam:        4.60 cm 2.12 cm/m   RA Area:     19.60 cm LA Vol (A2C):   78.1 ml 36.01 ml/m  RA Volume:   56.40 ml  26.01 ml/m LA Vol (A4C):   56.3 ml 25.96 ml/m LA Biplane Vol: 71.6 ml 33.02 ml/m  AORTIC VALVE                     PULMONIC VALVE AV Area (Vmax):    1.59 cm      PV Vmax:       0.93 m/s AV Area (Vmean):   1.54 cm      PV Peak grad:  3.4 mmHg AV Area (VTI):     1.88 cm AV Vmax:           162.00 cm/s AV Vmean:          112.500 cm/s AV VTI:            0.310 m AV Peak Grad:      10.5 mmHg AV Mean Grad:      6.0 mmHg LVOT Vmax:         101.00 cm/s LVOT Vmean:        68.100 cm/s LVOT VTI:          0.229 m LVOT/AV VTI ratio: 0.74  AORTA Ao Root diam: 2.60 cm Ao Asc diam:  3.00 cm MITRAL  VALVE                TRICUSPID VALVE MV Area (PHT): 3.31 cm     TR Peak grad:   64.0 mmHg MV Decel Time: 229 msec     TR Mean grad:   39.0 mmHg MV E velocity: 101.00 cm/s  TR Vmax:        400.00 cm/s                             TR Vmean:       291.0 cm/s                              SHUNTS                             Systemic VTI:  0.23 m                             Systemic Diam: 1.80 cm Julien Nordmann MD Electronically signed by Julien Nordmann MD Signature Date/Time: 06/08/2023/3:07:42 PM    Final    DG Chest Portable 1 View  Result Date: 06/07/2023 CLINICAL DATA:  Sepsis.  Weakness. EXAM: PORTABLE CHEST 1 VIEW COMPARISON:  09/28/2022. FINDINGS: Stable heart size.The cardiomediastinal  contours are unchanged with aortic atherosclerosis. The lungs are clear. Pulmonary vasculature is normal. No consolidation, pleural effusion, or pneumothorax. No acute osseous abnormalities are seen. IMPRESSION: No acute chest findings. Electronically Signed   By: Narda Rutherford M.D.   On: 06/07/2023 15:01   CT ABDOMEN PELVIS W CONTRAST  Result Date: 06/07/2023 CLINICAL DATA:  Abdominal pain. EXAM: CT ABDOMEN AND PELVIS WITH CONTRAST TECHNIQUE: Multidetector CT imaging of the abdomen and pelvis was performed using the standard protocol following bolus administration of intravenous contrast. RADIATION DOSE REDUCTION: This exam was performed according to the departmental dose-optimization program which includes automated exposure control, adjustment of the mA and/or kV according to patient size and/or use of iterative reconstruction technique. CONTRAST:  OMNIPAQUE IOHEXOL 300 MG/ML  SOLN COMPARISON:  05/29/2022 FINDINGS: Lower chest: Subsegmental atelectasis and/or linear scarring noted in the lung bases. Hepatobiliary: No suspicious focal abnormality within the liver parenchyma. There is no evidence for gallstones, gallbladder wall thickening, or pericholecystic fluid. No intrahepatic or extrahepatic biliary dilation. Pancreas: No focal mass lesion. No dilatation of the main duct. No intraparenchymal cyst. No peripancreatic edema. Spleen: No splenomegaly. No suspicious focal mass lesion. Adrenals/Urinary Tract: No adrenal nodule or mass. Both kidneys are atrophic. Tiny well-defined homogeneous low-density lesions in both kidneys are too small to characterize but are statistically most likely benign and probably cysts. No followup imaging is recommended. 9.6 cm simple cyst exophytic from the upper interpolar left kidney is mildly progressive in the interval. No evidence for hydroureter. Bladder is decompressed. Stomach/Bowel: Stomach is unremarkable. No gastric wall thickening. No evidence of outlet  obstruction. Duodenum is normally positioned as is the ligament of Treitz. No small bowel wall thickening. No small bowel dilatation. The terminal ileum is normal. The appendix is normal. No gross colonic mass. No colonic wall thickening. Vascular/Lymphatic: There is mild atherosclerotic calcification of the abdominal aorta without aneurysm. There is no gastrohepatic or hepatoduodenal ligament lymphadenopathy. No retroperitoneal or mesenteric lymphadenopathy. No pelvic sidewall lymphadenopathy. Reproductive: Hysterectomy.  There is no adnexal mass. Other: No  intraperitoneal free fluid. Musculoskeletal: Marked laxity of the anterior abdominal wall fascia again noted. No worrisome lytic or sclerotic osseous abnormality. IMPRESSION: 1. Stable exam. No acute findings in the abdomen or pelvis. Specifically, no findings to explain the patient's history of abdominal pain. 2. Bilateral renal atrophy with multiple simple cysts again noted. No followup imaging is recommended. 3.  Aortic Atherosclerosis (ICD10-I70.0). Electronically Signed   By: Kennith Center M.D.   On: 06/07/2023 14:59     Assessment & Plan: Pt is a 77 y.o. female with a PMHx of ESRD on HD TTS at [facility], diabetes mellitus type 2, chronic hypotension, peripheral arterial disease, obstructive sleep apnea, GERD, obesity, hyperlipidemia, anemia chronic kidney disease, secondary hyperparathyroidism, who was admitted to [facility] on 06/07/2023 for evaluation of generalized weakness.  1.  ESRD on HD TTS.  Patient noted to be hypotensive worse than her prior baseline.  Patient was on Levophed earlier in the morning but now off.  Case discussed with critical care.  She may need an arterial line pressure to determine accurate pressure today.  If a more definitive blood pressure can be obtained we may consider hemodialysis as the patient has chronic hypotension.  2.  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  Systolic blood pressure usually in the 80s as an  outpatient.  She is on midodrine 10 mg 3 times daily as outpatient.  3.  Anemia of chronic kidney disease.  Hemoglobin currently 12.6.  Hold off on erythropoietin stimulating agents.  4.  Secondary hyperparathyroidism.  Phosphorus currently 6.0.  Continue Cinacalcet 30 mg every afternoon and Auryxia 4 to 20 mg 3 times daily.

## 2023-06-09 NOTE — Progress Notes (Signed)
Patient tolerated 3 hours of Hd with no fluid removal, post Hd report given to shift RN  06/09/23 1949  Vitals  Temp 98.2 F (36.8 C)  Temp Source Oral  BP (!) 148/44  MAP (mmHg) 68  Pulse Rate 75  Pulse Rate Source Monitor  ECG Heart Rate 75  Resp 17  Oxygen Therapy  SpO2 100 %  O2 Device Nasal Cannula  O2 Flow Rate (L/min) 2 L/min  Patient Activity (if Appropriate) In bed  Pulse Oximetry Type Continuous  During Treatment Monitoring  Intra-Hemodialysis Comments Tx completed;Tolerated well  Post Treatment  Dialyzer Clearance Clear  Duration of HD Treatment -hour(s) 3 hour(s)  Fluid Removed (mL) 0 mL  Tolerated HD Treatment Yes  Post-Hemodialysis Comments tolerated well  AVG/AVF Arterial Site Held (minutes) 10 minutes  AVG/AVF Venous Site Held (minutes) 10 minutes  Fistula / Graft Left Upper arm  No placement date or time found.   Placed prior to admission: Yes  Orientation: Left  Access Location: Upper arm  Site Condition No complications  Fistula / Graft Assessment Present;Thrill;Bruit  Status Deaccessed  Needle Size 15  Drainage Description None

## 2023-06-09 NOTE — Plan of Care (Signed)

## 2023-06-09 NOTE — Progress Notes (Signed)
ANTICOAGULATION CONSULT NOTE - Initial Consult  Pharmacy Consult for IV heparin Indication: atrial fibrillation  Allergies  Allergen Reactions   Other Other (See Comments)   Sulfa Antibiotics Other (See Comments)   Sulfa Drugs Cross Reactors Other (See Comments)    Doesn't remember     Patient Measurements: Height: 5\' 4"  (162.6 cm) Weight: 115.5 kg (254 lb 10.1 oz) IBW/kg (Calculated) : 54.7 Heparin Dosing Weight: 115.5 kg  Vital Signs: Temp: 98.6 F (37 C) (07/08 1200) Temp Source: Oral (07/08 1200) BP: 109/98 (07/08 1300) Pulse Rate: 79 (07/08 1300)  Labs: Recent Labs    06/07/23 1254 06/08/23 0726 06/09/23 0350  HGB 12.4 12.6  --   HCT 41.0 40.2  --   PLT 146* 138*  --   CREATININE 7.76* 8.41* 9.89*    Estimated Creatinine Clearance: 5.8 mL/min (A) (by C-G formula based on SCr of 9.89 mg/dL (H)).   Medical History: Past Medical History:  Diagnosis Date   Arthritis    "left knee" (12/04/2016)   CAD (coronary artery disease)    a. 12/2016 NSTEMI/PCI: LM nl, LaD 20p/d, D1 20, OM2 20, OM3 20, RCA 41m (3.0x18 Resolute DES). EF 65%.   Chronic lower back pain    Diverticulosis    on CT   ESRD on dialysis [facility])    GERD (gastroesophageal reflux disease)    Hiatal hernia    History of echocardiogram    a. 09/2022 Echo: EF 60-65%, mild asymm LVH, mod reduced RV fxn, RVSP . Mildly dil RA. Mild AI. Ao sclerosis. Mild MR.   Hyperlipidemia    Hypertension    a. 12/2016 labetalol d/c'd 2/2 hypotension in HD.   Morbid obesity (HCC)    Obesity    Persistent atrial fibrillation (HCC)    a. 05/2023 Zio: 100% afib avg HR 52 (32-99).   Pneumonia    "couple times" (12/04/2016)   Type II diabetes mellitus (HCC)     Medications:  Eliquis prior to admission (last dose 7/7 @2100 )  Assessment: 77 year old female admitted with circulatory shock, of which has improved. Plan is for PPM, and thus holding Eliquis prior to procedure.   Goal of Therapy:  aPTT 66-102  seconds Monitor platelets by anticoagulation protocol: Yes   Plan:  Give 4500 units bolus x 1 Start heparin infusion at 1800 units/hr Check aPTT  in 8 hours. Monitor via aPTTs until correlation with heparin levels. Heparin levels daily.  CBC daily  Elliot Gurney, PharmD, BCPS Clinical Pharmacist  06/09/2023 2:26 PM

## 2023-06-09 NOTE — Consult Note (Signed)
ELECTROPHYSIOLOGY CONSULT NOTE    Patient ID: Jacqueline [name] MRN: [id number], DOB/AGE: [birth year] 77 y.o.  Admit date: 06/07/2023 Date of Consult: 06/09/2023  Primary Physician: Macky Lower, MD Primary Cardiologist: Lorine Bears, MD  Electrophysiologist: none, new to Dr. Nelly Laurence  Referring Provider: Dr. Belia Heman  Patient Profile: Jacqueline [name] is a 77 y.o. female with a history of CAD s/p PCI, ESRD on HD, HTN, HypoTN,  bradycardia, persis AFib, OSA who is being seen today for the evaluation of bradycardia at the request of Dr. Belia Heman.  HPI:  Jacqueline [name] is a 77 y.o. female with above PMH who has been having issues with presyncope, bradycardia, and hypotension since Jan 2024. She has HD T/Th/Sa and has been prescribed midodrine for HD sessions, but frequently cannot complete full sessions d/t hypotension. She was at HD session 7/6 where she was found to be hypotensive to 72/42. She was sent to ER for further workup. Labs overall unremarkable, HR 40, BP 81/51. Initially treated with midodrine and IVF bolus   Dopamine gtt. Dopamine is now off and norepi on. She is not on any AVN blocking agents at home.   On interview, she feels at her baseline. Denies SOB, dizziness, presyncope with activity. Spends vast majority of the day in bed or chair. She walks with assistance of walker.   She denies chest pain, palpitations, dyspnea, PND, orthopnea, nausea, vomiting, dizziness, syncope, edema, weight gain, or early satiety.   Labs Potassium3.8 (07/08 0350) Magnesium  1.8 (07/08 0350) Creatinine, ser  9.89* (07/08 0350) PLT  138* (07/07 0726) HGB  12.6 (07/07 0726) WBC 12.4* (07/07 0726)  .    Past Medical History:  Diagnosis Date   Arthritis    "left knee" (12/04/2016)   CAD (coronary artery disease)    a. 12/2016 NSTEMI/PCI: LM nl, LaD 20p/d, D1 20, OM2 20, OM3 20, RCA 85m (3.0x18 Resolute DES). EF 65%.   Chronic lower back pain    Diverticulosis    on CT   ESRD on dialysis  [facility])    GERD (gastroesophageal reflux disease)    Hiatal hernia    History of echocardiogram    a. 09/2022 Echo: EF 60-65%, mild asymm LVH, mod reduced RV fxn, RVSP . Mildly dil RA. Mild AI. Ao sclerosis. Mild MR.   Hyperlipidemia    Hypertension    a. 12/2016 labetalol d/c'd 2/2 hypotension in HD.   Morbid obesity (HCC)    Obesity    Persistent atrial fibrillation (HCC)    a. 05/2023 Zio: 100% afib avg HR 52 (32-99).   Pneumonia    "couple times" (12/04/2016)   Type II diabetes mellitus (HCC)      Surgical History:  Past Surgical History:  Procedure Laterality Date   ABDOMINAL HYSTERECTOMY     AV FISTULA PLACEMENT Left 01/27/2014   Procedure: ARTERIOVENOUS (AV) FISTULA CREATION;  Surgeon: Pryor Ochoa, MD;  Location: [facility] OR;  Service: Vascular;  Laterality: Left;   AV FISTULA REPAIR     "had it cleaned out"   CARDIAC CATHETERIZATION  ?1980s   CARDIAC CATHETERIZATION N/A 12/04/2016   Procedure: Left Heart Cath Angiography;  Surgeon: Kathleene Hazel, MD;  Location: [facility] INVASIVE CV LAB;  Service: Cardiovascular;  Laterality: N/A;   CARDIAC CATHETERIZATION N/A 12/04/2016   Procedure: Coronary Stent Intervention;  Surgeon: Kathleene Hazel, MD;  Location: [facility] INVASIVE CV LAB;  Service: Cardiovascular;  Laterality: N/A;  Mid RCA   CATARACT EXTRACTION W/ INTRAOCULAR LENS IMPLANT  Left    COLONOSCOPY W/ BIOPSIES AND POLYPECTOMY     CORONARY ANGIOPLASTY WITH STENT PLACEMENT  12/04/2016   ESOPHAGOGASTRODUODENOSCOPY     INSERTION OF DIALYSIS CATHETER N/A 01/19/2014   Procedure: INSERTION OF DIALYSIS CATHETER;  Surgeon: Larina Earthly, MD;  Location: [facility] OR;  Service: Vascular;  Laterality: N/A;     Medications Prior to Admission  Medication Sig Dispense Refill Last Dose   acetaminophen (TYLENOL) 650 MG CR tablet Take 1,300 mg by mouth every 8 (eight) hours as needed for pain.      albuterol (VENTOLIN HFA) 108 (90 Base) MCG/ACT inhaler Inhale 1-2 puffs into the lungs every 6 (six)  hours as needed for wheezing or shortness of breath. 1 each 0    apixaban (ELIQUIS) 5 MG TABS tablet Take 1 tablet (5 mg total) by mouth 2 (two) times daily. 60 tablet     B Complex-C-Zn-Folic Acid (DIALYVITE/ZINC) TABS Take 1 tablet by mouth daily.      bisacodyl (DULCOLAX) 5 MG EC tablet Take 2 tablets (10 mg total) by mouth daily as needed for moderate constipation. 30 tablet 0    cinacalcet (SENSIPAR) 30 MG tablet Take 30 mg by mouth daily with supper.      ezetimibe (ZETIA) 10 MG tablet Take 1 tablet (10 mg total) by mouth daily. 30 tablet 5    ferric citrate (AURYXIA) 1 GM 210 MG(Fe) tablet Take 420 mg by mouth 3 (three) times daily with meals.      fexofenadine (ALLEGRA) 180 MG tablet Take 180 mg by mouth daily.      midodrine (PROAMATINE) 10 MG tablet Take 20 mg by mouth daily. (Take additional two tablets [20mg ] for administration at dialysis on Tuesday, Thursday and Saturday)      midodrine (PROAMATINE) 10 MG tablet Take 20 mg by mouth daily as needed (for SBP <100).      Omega-3 Fatty Acids (FISH OIL) 1000 MG CPDR Take 1,000 mg by mouth daily.      omeprazole (PRILOSEC) 20 MG capsule Take 1 capsule (20 mg total) by mouth daily. (Patient taking differently: Take 20 mg by mouth 2 (two) times daily.) 90 capsule 1    ondansetron (ZOFRAN-ODT) 4 MG disintegrating tablet Take 4 mg by mouth every 8 (eight) hours as needed for nausea or vomiting.      polyethylene glycol (MIRALAX / GLYCOLAX) 17 g packet Take 17 g by mouth at bedtime.      pregabalin (LYRICA) 100 MG capsule Take 100 mg by mouth at bedtime.      pregabalin (LYRICA) 50 MG capsule Take 50 mg by mouth 2 (two) times daily.      rosuvastatin (CRESTOR) 10 MG tablet Take 1 tablet (10 mg total) by mouth daily.      sennosides-docusate sodium (SENOKOT-S) 8.6-50 MG tablet Take 1 tablet by mouth 2 (two) times daily.      trolamine salicylate (ASPERCREME) 10 % cream Apply 1 Application topically 2 (two) times daily. (Apply to knees)        Inpatient Medications:   apixaban  5 mg Oral BID   Chlorhexidine Gluconate Cloth  6 each Topical Daily   cinacalcet  30 mg Oral Q supper   ezetimibe  10 mg Oral Daily   ferric citrate  420 mg Oral TID WC   fludrocortisone  0.1 mg Oral Daily   insulin aspart  0-6 Units Subcutaneous Q4H   loratadine  10 mg Oral Daily   midodrine  10 mg Oral  TID WC   pregabalin  100 mg Oral QHS   pregabalin  50 mg Oral BID   rosuvastatin  10 mg Oral Daily   senna-docusate  1 tablet Oral BID    Allergies:  Allergies  Allergen Reactions   Other Other (See Comments)   Sulfa Antibiotics Other (See Comments)   Sulfa Drugs Cross Reactors Other (See Comments)    Doesn't remember     Family History  Problem Relation Age of Onset   Diabetes Mother    Peripheral vascular disease Mother        amputation   CAD Father    Heart disease Father    Hypertension Father    Heart attack Father    CAD Sister    Cancer Brother        x 2 brothers, type unknown   Breast cancer Sister      Physical Exam: Vitals:   06/09/23 0730 06/09/23 0745 06/09/23 0800 06/09/23 0815  BP: (!) 71/30 (!) 78/27 (!) 74/41 (!) 74/34  Pulse:    86  Resp: 14 15 15 16   Temp:      TempSrc:      SpO2:    98%  Weight:      Height:        GEN- NAD, A&O x 3, normal affect HEENT: Normocephalic, atraumatic Lungs- CTAB, Normal effort.  Heart- Regular rate and rhythm, No M/G/R.  GI- Soft, NT, ND.  Extremities- No clubbing, cyanosis, or edema   Radiology/Studies: ECHOCARDIOGRAM COMPLETE  Result Date: 06/08/2023    ECHOCARDIOGRAM REPORT   Patient Name:   Jacqueline [name] Date of Exam: 06/08/2023 Medical Rec #:  [id number]         Height:       64.0 in Accession #:    [id number]        Weight:       254.9 lb Date of Birth:  07/01/1946          BSA:          2.169 m Patient Age:    77 years          BP:           100/59 mmHg Patient Gender: F                 HR:           63 bpm. Exam Location:  [facility] Procedure: 2D Echo, Color  Doppler and Cardiac Doppler Indications:     CHF I50.31  History:         Patient has prior history of Echocardiogram examinations, most                  recent 09/09/2022. CHF, CAD, ESRD, Signs/Symptoms:Chest Pain;                  Risk Factors:Dyslipidemia, Diabetes, Hypertension and Former                  Smoker.  Sonographer:     Dondra Prader RVT RCS Referring Phys:  [id number] Hubbard Hartshorn OUMA Diagnosing Phys: Julien Nordmann MD  Sonographer Comments: Patient is obese. Image acquisition challenging due to patient body habitus. IMPRESSIONS  1. Left ventricular ejection fraction, by estimation, is 60 to 65%. The left ventricle has normal function. The left ventricle has no regional wall motion abnormalities. Left ventricular diastolic parameters are indeterminate.  2. Right ventricular systolic function is moderately reduced. The right ventricular size  is moderately enlarged. There is severely elevated pulmonary artery systolic pressure. The estimated right ventricular systolic pressure is 79.0 mmHg.  3. Left atrial size was mildly dilated.  4. The mitral valve is normal in structure. Mild mitral valve regurgitation. No evidence of mitral stenosis.  5. Tricuspid valve regurgitation is moderate.  6. The aortic valve is normal in structure. Aortic valve regurgitation is not visualized. Aortic valve sclerosis is present, with no evidence of aortic valve stenosis.  7. The inferior vena cava is dilated in size with <50% respiratory variability, suggesting right atrial pressure of 15 mmHg. FINDINGS  Left Ventricle: Left ventricular ejection fraction, by estimation, is 60 to 65%. The left ventricle has normal function. The left ventricle has no regional wall motion abnormalities. The left ventricular internal cavity size was normal in size. There is  no left ventricular hypertrophy. Left ventricular diastolic parameters are indeterminate. Right Ventricle: The right ventricular size is moderately enlarged. No increase in  right ventricular wall thickness. Right ventricular systolic function is moderately reduced. There is severely elevated pulmonary artery systolic pressure. The tricuspid regurgitant velocity is 4.00 m/s, and with an assumed right atrial pressure of 15 mmHg, the estimated right ventricular systolic pressure is 79.0 mmHg. Left Atrium: Left atrial size was mildly dilated. Right Atrium: Right atrial size was normal in size. Pericardium: There is no evidence of pericardial effusion. Mitral Valve: The mitral valve is normal in structure. Mild mitral valve regurgitation. No evidence of mitral valve stenosis. Tricuspid Valve: The tricuspid valve is normal in structure. Tricuspid valve regurgitation is moderate . No evidence of tricuspid stenosis. Aortic Valve: The aortic valve is normal in structure. Aortic valve regurgitation is not visualized. Aortic valve sclerosis is present, with no evidence of aortic valve stenosis. Aortic valve mean gradient measures 6.0 mmHg. Aortic valve peak gradient measures 10.5 mmHg. Aortic valve area, by VTI measures 1.88 cm. Pulmonic Valve: The pulmonic valve was normal in structure. Pulmonic valve regurgitation is trivial. No evidence of pulmonic stenosis. Aorta: The aortic root is normal in size and structure. Venous: The inferior vena cava is dilated in size with less than 50% respiratory variability, suggesting right atrial pressure of 15 mmHg. IAS/Shunts: No atrial level shunt detected by color flow Doppler.  LEFT VENTRICLE PLAX 2D LVIDd:         5.00 cm   Diastology LVIDs:         3.20 cm   LV e' medial:    6.81 cm/s LV PW:         0.90 cm   LV E/e' medial:  14.8 LV IVS:        1.30 cm   LV e' lateral:   9.32 cm/s LVOT diam:     1.80 cm   LV E/e' lateral: 10.8 LV SV:         58 LV SV Index:   27 LVOT Area:     2.54 cm  RIGHT VENTRICLE             IVC RV Basal diam:  4.30 cm     IVC diam: 2.00 cm RV Mid diam:    4.60 cm RV S prime:     12.10 cm/s TAPSE (M-mode): 1.6 cm LEFT ATRIUM              Index        RIGHT ATRIUM           Index LA diam:        4.60  cm 2.12 cm/m   RA Area:     19.60 cm LA Vol (A2C):   78.1 ml 36.01 ml/m  RA Volume:   56.40 ml  26.01 ml/m LA Vol (A4C):   56.3 ml 25.96 ml/m LA Biplane Vol: 71.6 ml 33.02 ml/m  AORTIC VALVE                     PULMONIC VALVE AV Area (Vmax):    1.59 cm      PV Vmax:       0.93 m/s AV Area (Vmean):   1.54 cm      PV Peak grad:  3.4 mmHg AV Area (VTI):     1.88 cm AV Vmax:           162.00 cm/s AV Vmean:          112.500 cm/s AV VTI:            0.310 m AV Peak Grad:      10.5 mmHg AV Mean Grad:      6.0 mmHg LVOT Vmax:         101.00 cm/s LVOT Vmean:        68.100 cm/s LVOT VTI:          0.229 m LVOT/AV VTI ratio: 0.74  AORTA Ao Root diam: 2.60 cm Ao Asc diam:  3.00 cm MITRAL VALVE                TRICUSPID VALVE MV Area (PHT): 3.31 cm     TR Peak grad:   64.0 mmHg MV Decel Time: 229 msec     TR Mean grad:   39.0 mmHg MV E velocity: 101.00 cm/s  TR Vmax:        400.00 cm/s                             TR Vmean:       291.0 cm/s                              SHUNTS                             Systemic VTI:  0.23 m                             Systemic Diam: 1.80 cm Julien Nordmann MD Electronically signed by Julien Nordmann MD Signature Date/Time: 06/08/2023/3:07:42 PM    Final    DG Chest Portable 1 View  Result Date: 06/07/2023 CLINICAL DATA:  Sepsis.  Weakness. EXAM: PORTABLE CHEST 1 VIEW COMPARISON:  09/28/2022. FINDINGS: Stable heart size.The cardiomediastinal contours are unchanged with aortic atherosclerosis. The lungs are clear. Pulmonary vasculature is normal. No consolidation, pleural effusion, or pneumothorax. No acute osseous abnormalities are seen. IMPRESSION: No acute chest findings. Electronically Signed   By: Narda Rutherford M.D.   On: 06/07/2023 15:01   CT ABDOMEN PELVIS W CONTRAST  Result Date: 06/07/2023 CLINICAL DATA:  Abdominal pain. EXAM: CT ABDOMEN AND PELVIS WITH CONTRAST TECHNIQUE: Multidetector CT imaging of the  abdomen and pelvis was performed using the standard protocol following bolus administration of intravenous contrast. RADIATION DOSE REDUCTION: This exam was performed according to the departmental dose-optimization program which includes automated exposure control, adjustment of the mA and/or  kV according to patient size and/or use of iterative reconstruction technique. CONTRAST:  OMNIPAQUE IOHEXOL 300 MG/ML  SOLN COMPARISON:  05/29/2022 FINDINGS: Lower chest: Subsegmental atelectasis and/or linear scarring noted in the lung bases. Hepatobiliary: No suspicious focal abnormality within the liver parenchyma. There is no evidence for gallstones, gallbladder wall thickening, or pericholecystic fluid. No intrahepatic or extrahepatic biliary dilation. Pancreas: No focal mass lesion. No dilatation of the main duct. No intraparenchymal cyst. No peripancreatic edema. Spleen: No splenomegaly. No suspicious focal mass lesion. Adrenals/Urinary Tract: No adrenal nodule or mass. Both kidneys are atrophic. Tiny well-defined homogeneous low-density lesions in both kidneys are too small to characterize but are statistically most likely benign and probably cysts. No followup imaging is recommended. 9.6 cm simple cyst exophytic from the upper interpolar left kidney is mildly progressive in the interval. No evidence for hydroureter. Bladder is decompressed. Stomach/Bowel: Stomach is unremarkable. No gastric wall thickening. No evidence of outlet obstruction. Duodenum is normally positioned as is the ligament of Treitz. No small bowel wall thickening. No small bowel dilatation. The terminal ileum is normal. The appendix is normal. No gross colonic mass. No colonic wall thickening. Vascular/Lymphatic: There is mild atherosclerotic calcification of the abdominal aorta without aneurysm. There is no gastrohepatic or hepatoduodenal ligament lymphadenopathy. No retroperitoneal or mesenteric lymphadenopathy. No pelvic sidewall  lymphadenopathy. Reproductive: Hysterectomy.  There is no adnexal mass. Other: No intraperitoneal free fluid. Musculoskeletal: Marked laxity of the anterior abdominal wall fascia again noted. No worrisome lytic or sclerotic osseous abnormality. IMPRESSION: 1. Stable exam. No acute findings in the abdomen or pelvis. Specifically, no findings to explain the patient's history of abdominal pain. 2. Bilateral renal atrophy with multiple simple cysts again noted. No followup imaging is recommended. 3.  Aortic Atherosclerosis (ICD10-I70.0). Electronically Signed   By: Kennith Center M.D.   On: 06/07/2023 14:59   LONG TERM MONITOR (3-14 DAYS)  Result Date: 05/14/2023 Patch Wear Time:  13 days and 23 hours (2024-05-15T13:34:58-0400 to 2024-05-29T13:34:54-0400) Atrial Fibrillation occurred continuously (100% burden), ranging from 32-99 bpm (avg of 52 bpm). Rare PVCs. Most bradycardic events happened during early morning hours.   EKG:06/07/23 at 1230: AFib w bradycardia, rate 40; low voltage EKG (personally reviewed)  TELEMETRY: AFib, rates 40-90s (personally reviewed)   Assessment/Plan: #) bradycardia #) Hypotension No AVN blocking agents Continue norepi gtt Recommended PPM given ongoing bradycardia and hypotension. With patients ESRD on HD, recommend Micra leadless PPM. Transfer to [facility] for Micra placement - discussed with CC PA to facilitate transfer  #)  Persis AFib (?perm) CHA2DS2-VASc Score = 6 (HTN, T2DM, vasc dz, age x 2, gender)  Hold eliquis for procedure Hep gtt per pharmacy         For questions or updates, please contact [facility] Please consult [email or web address] for contact info under Cardiology/STEMI.  Signed, Sherie Don, NP  06/09/2023 8:58 AM

## 2023-06-10 DIAGNOSIS — R579 Shock, unspecified: Secondary | ICD-10-CM | POA: Diagnosis not present

## 2023-06-10 DIAGNOSIS — I272 Pulmonary hypertension, unspecified: Secondary | ICD-10-CM

## 2023-06-10 DIAGNOSIS — N186 End stage renal disease: Secondary | ICD-10-CM | POA: Diagnosis not present

## 2023-06-10 DIAGNOSIS — R001 Bradycardia, unspecified: Secondary | ICD-10-CM | POA: Diagnosis not present

## 2023-06-10 LAB — CBC
HCT: 32.8 % — ABNORMAL LOW (ref 36.0–46.0)
Hemoglobin: 10.3 g/dL — ABNORMAL LOW (ref 12.0–15.0)
MCH: 30.7 pg (ref 26.0–34.0)
MCHC: 31.4 g/dL (ref 30.0–36.0)
MCV: 97.6 fL (ref 80.0–100.0)
Platelets: 114 10*3/uL — ABNORMAL LOW (ref 150–400)
RBC: 3.36 MIL/uL — ABNORMAL LOW (ref 3.87–5.11)
RDW: 15.3 % (ref 11.5–15.5)
WBC: 10.2 10*3/uL (ref 4.0–10.5)
nRBC: 0 % (ref 0.0–0.2)

## 2023-06-10 LAB — RENAL FUNCTION PANEL
Albumin: 3 g/dL — ABNORMAL LOW (ref 3.5–5.0)
Anion gap: 13 (ref 5–15)
BUN: 39 mg/dL — ABNORMAL HIGH (ref 8–23)
CO2: 26 mmol/L (ref 22–32)
Calcium: 7.3 mg/dL — ABNORMAL LOW (ref 8.9–10.3)
Chloride: 94 mmol/L — ABNORMAL LOW (ref 98–111)
Creatinine, Ser: 5.89 mg/dL — ABNORMAL HIGH (ref 0.44–1.00)
GFR, Estimated: 7 mL/min — ABNORMAL LOW (ref >60)
Glucose, Bld: 175 mg/dL — ABNORMAL HIGH (ref 70–99)
Phosphorus: 3.2 mg/dL (ref 2.5–4.6)
Potassium: 3.2 mmol/L — ABNORMAL LOW (ref 3.5–5.1)
Sodium: 133 mmol/L — ABNORMAL LOW (ref 135–145)

## 2023-06-10 LAB — APTT
aPTT: >200 seconds (ref 24–36)
aPTT: >200 seconds (ref 24–36)

## 2023-06-10 LAB — GLUCOSE, CAPILLARY
Glucose-Capillary: 134 mg/dL — ABNORMAL HIGH (ref 70–99)
Glucose-Capillary: 141 mg/dL — ABNORMAL HIGH (ref 70–99)
Glucose-Capillary: 147 mg/dL — ABNORMAL HIGH (ref 70–99)
Glucose-Capillary: 161 mg/dL — ABNORMAL HIGH (ref 70–99)
Glucose-Capillary: 164 mg/dL — ABNORMAL HIGH (ref 70–99)
Glucose-Capillary: 167 mg/dL — ABNORMAL HIGH (ref 70–99)
Glucose-Capillary: 195 mg/dL — ABNORMAL HIGH (ref 70–99)

## 2023-06-10 LAB — CULTURE, BLOOD (ROUTINE X 2)

## 2023-06-10 LAB — HEPATITIS B SURFACE ANTIBODY, QUANTITATIVE: Hep B S AB Quant (Post): 82.9 m[IU]/mL

## 2023-06-10 LAB — ACTH: C206 ACTH: 9.4 pg/mL (ref 7.2–63.3)

## 2023-06-10 LAB — HEPARIN LEVEL (UNFRACTIONATED): Heparin Unfractionated: >1.1 IU/mL — ABNORMAL HIGH (ref 0.30–0.70)

## 2023-06-10 MED ORDER — ASPIRIN 81 MG PO CHEW
81.0000 mg | CHEWABLE_TABLET | ORAL | Status: AC
  Administered 2023-06-11: 81 mg via ORAL
  Filled 2023-06-10: qty 1

## 2023-06-10 MED ORDER — SODIUM CHLORIDE 0.9 % IV SOLN: 250.0000 mL | INTRAVENOUS | Status: DC | PRN

## 2023-06-10 MED ORDER — HEPARIN (PORCINE) 25000 UT/250ML-% IV SOLN
1400.0000 [IU]/h | INTRAVENOUS | Status: DC
  Administered 2023-06-11: 1400 [IU]/h via INTRAVENOUS
  Filled 2023-06-10: qty 250

## 2023-06-10 MED ORDER — SODIUM CHLORIDE 0.9% FLUSH: 3.0000 mL | INTRAVENOUS | Status: DC | PRN

## 2023-06-10 MED ORDER — POTASSIUM CHLORIDE 20 MEQ PO PACK: 40.0000 meq | PACK | Freq: Once | ORAL | Status: DC

## 2023-06-10 MED ORDER — POTASSIUM CHLORIDE CRYS ER 20 MEQ PO TBCR
20.0000 meq | EXTENDED_RELEASE_TABLET | Freq: Once | ORAL | Status: AC
  Administered 2023-06-10: 20 meq via ORAL
  Filled 2023-06-10: qty 1

## 2023-06-10 MED ORDER — ACETAMINOPHEN 325 MG PO TABS
650.0000 mg | ORAL_TABLET | Freq: Four times a day (QID) | ORAL | Status: DC | PRN
  Administered 2023-06-10 – 2023-06-29 (×22): 650 mg via ORAL
  Filled 2023-06-10 (×21): qty 2

## 2023-06-10 MED ORDER — SODIUM CHLORIDE 0.9 % IV SOLN: INTRAVENOUS | Status: DC

## 2023-06-10 MED ORDER — POTASSIUM CHLORIDE 20 MEQ PO PACK
20.0000 meq | PACK | Freq: Once | ORAL | Status: DC
  Filled 2023-06-10: qty 1

## 2023-06-10 MED ORDER — SODIUM CHLORIDE 0.9% FLUSH
3.0000 mL | Freq: Two times a day (BID) | INTRAVENOUS | Status: DC
  Administered 2023-06-10 – 2023-06-11 (×2): 3 mL via INTRAVENOUS

## 2023-06-10 NOTE — Consult Note (Addendum)
PHARMACY CONSULT NOTE - FOLLOW UP  Pharmacy Consult for Electrolyte Monitoring and Replacement   Recent Labs: Potassium (mmol/L)  Date Value  06/10/2023 3.2 (L)   Magnesium (mg/dL)  Date Value  45/40/9811 1.8   Calcium (mg/dL)  Date Value  91/47/8295 7.3 (L)   Albumin (g/dL)  Date Value  62/13/0865 3.0 (L)  10/10/2021 4.1   Phosphorus (mg/dL)  Date Value  78/46/9629 3.2   Sodium (mmol/L)  Date Value  06/10/2023 133 (L)  10/10/2021 142     Assessment: 77 y.o female with significant PMH of HTN, T2DM,ESRD on HD TTS, Chronic debility , CAD s/p stent, PAF,  OSA, PAD, Left knee arthritis, Chronic lower back pain,GERD,Obesity , HLD  who presented to the ED  from Dialysis with chief complaints of generalized weakness x 4 days.   Goal of Therapy:  Within normal limits  Plan:  K 3.2. Will replace with Kcl x 1. F/u with AM labs.   Elliot Gurney, PharmD, BCPS Clinical Pharmacist  06/10/2023 8:16 AM

## 2023-06-10 NOTE — Progress Notes (Signed)
ANTICOAGULATION CONSULT NOTE - Initial Consult  Pharmacy Consult for IV heparin Indication: atrial fibrillation  Allergies  Allergen Reactions   Other Other (See Comments)   Sulfa Antibiotics Other (See Comments)   Sulfa Drugs Cross Reactors Other (See Comments)    Doesn't remember     Patient Measurements: Height: 5\' 4"  (162.6 cm) Weight: 117.8 kg (259 lb 11.2 oz) IBW/kg (Calculated) : 54.7 Heparin Dosing Weight: 115.5 kg  Vital Signs: Temp: 98.7 F (37.1 C) (07/09 0400) Temp Source: Oral (07/09 0400) Pulse Rate: 91 (07/09 0445)  Labs: Recent Labs    06/08/23 0726 06/09/23 0350 06/09/23 1238 06/09/23 2314 06/10/23 0445  HGB 12.6  --  10.7*  --  10.3*  HCT 40.2  --  33.8*  --  32.8*  PLT 138*  --  120*  --  114*  APTT  --   --   --  >200*  --   HEPARINUNFRC  --   --   --   --  >1.10*  CREATININE 8.41* 9.89*  --   --  5.89*     Estimated Creatinine Clearance: 9.8 mL/min (A) (by C-G formula based on SCr of 5.89 mg/dL (H)).   Medical History: Past Medical History:  Diagnosis Date   Arthritis    "left knee" (12/04/2016)   CAD (coronary artery disease)    a. 12/2016 NSTEMI/PCI: LM nl, LaD 20p/d, D1 20, OM2 20, OM3 20, RCA 74m (3.0x18 Resolute DES). EF 65%.   Chronic lower back pain    Diverticulosis    on CT   ESRD on dialysis [facility])    GERD (gastroesophageal reflux disease)    Hiatal hernia    History of echocardiogram    a. 09/2022 Echo: EF 60-65%, mild asymm LVH, mod reduced RV fxn, RVSP . Mildly dil RA. Mild AI. Ao sclerosis. Mild MR.   Hyperlipidemia    Hypertension    a. 12/2016 labetalol d/c'd 2/2 hypotension in HD.   Morbid obesity (HCC)    Obesity    Persistent atrial fibrillation (HCC)    a. 05/2023 Zio: 100% afib avg HR 52 (32-99).   Pneumonia    "couple times" (12/04/2016)   Type II diabetes mellitus (HCC)     Medications:  Eliquis prior to admission (last dose 7/7 @2100 )  Assessment: 77 year old female admitted with circulatory shock,  of which has improved. Plan is for PPM, and thus holding Eliquis prior to procedure.   Goal of Therapy:  aPTT 66-102 seconds Monitor platelets by anticoagulation protocol: Yes  07/08 2314 aPTT > 200   Plan:  --PPM will not occur today per PCCM. Restarting IV heparin. Restart heparin at previous therapeutic rate of 1800 units/hr. --aPTT 8 hours after restart  Elliot Gurney, PharmD, BCPS Clinical Pharmacist  06/10/2023 8:45 AM

## 2023-06-10 NOTE — Progress Notes (Addendum)
Patient Name: Jacqueline [name] Date of Encounter: 06/10/2023  Primary Cardiologist: Lorine Bears, MD Electrophysiologist: Maurice Small, MD  Interval Summary   Jacqueline [name]. Patient feeling well this AM. A-line has been placed for better BP mgmt.  Was hypotensive this AM after lying flat for bath, HR was in 80s  Inpatient Medications    Scheduled Meds:  Chlorhexidine Gluconate Cloth  6 each Topical Daily   cinacalcet  30 mg Oral Q supper   ezetimibe  10 mg Oral Daily   feeding supplement (NEPRO CARB STEADY)  237 mL Oral TID BM   ferric citrate  420 mg Oral TID WC   fludrocortisone  0.1 mg Oral Daily   insulin aspart  0-6 Units Subcutaneous Q4H   loratadine  10 mg Oral Daily   midodrine  10 mg Oral TID WC   multivitamin  1 tablet Oral QHS   pregabalin  100 mg Oral QHS   pregabalin  50 mg Oral BID   rosuvastatin  10 mg Oral Daily   senna-docusate  1 tablet Oral BID   Continuous Infusions:  sodium chloride 10 mL/hr at 06/10/23 0600   anticoagulant sodium citrate     heparin Stopped (06/10/23 0120)   norepinephrine (LEVOPHED) Adult infusion 7 mcg/min (06/10/23 0649)   PRN Meds: acetaminophen, alteplase, anticoagulant sodium citrate, bisacodyl, docusate sodium, heparin, lidocaine (PF), lidocaine-prilocaine, ondansetron (ZOFRAN) IV, pentafluoroprop-tetrafluoroeth, polyethylene glycol   Vital Signs    Vitals:   06/10/23 0810 06/10/23 0830 06/10/23 0845 06/10/23 0900  BP:      Pulse: 77     Resp:  17 15 16   Temp:      TempSrc:      SpO2: 95%     Weight:      Height:        Intake/Output Summary (Last 24 hours) at 06/10/2023 0919 Last data filed at 06/10/2023 0600 Gross per 24 hour  Intake 1457.28 ml  Output 0 ml  Net 1457.28 ml   Filed Weights   06/09/23 0500 06/09/23 1622 06/10/23 0411  Weight: 115.5 kg 115.4 kg 117.8 kg    Physical Exam    GEN- The patient is well appearing, alert and oriented x 3 today.   Lungs- Clear to ausculation bilaterally, normal work  of breathing Cardiac- Irregularly irregular rate and rhythm, no murmurs, rubs or gallops GI- soft, NT, ND, + BS Extremities- no clubbing or cyanosis. No edema  Telemetry    Afib, rates 60-90s (personally reviewed)  Hospital Course    Jacqueline [name] is a 77 y.o. female  with a history of CAD s/p PCI, ESRD on HD, HTN, HypoTN,  bradycardia, persis AFib, OSA admitted for bradycardia, hypotension. EP asked to consult for consideration of PPM  Assessment & Plan    #) bradycardia #) Hypotension #) pulmHTN #) CAD No AVN blocking agents Continue norepi gtt  Given hypotension without bradycardia, recommend further eval of CAD and pulmHTN with [facility]  Further discussion with Dr. Graciela Husbands and Dr. Okey Dupre with gen cards - will hold off on transfer to [facility] for PPM placement at this time.     #)  Persis AFib (?perm) CHA2DS2-VASc Score = 6 (HTN, T2DM, vasc dz, age x 2, gender)  Hold eliquis for procedure Hep gtt per pharmacy  #) ESRD Mgmt per primary team     For questions or updates, please contact [facility] Please consult [email or web address] for contact info under Cardiology/STEMI.  Signed, Sherie Don, NP  06/10/2023, 9:19 AM  Atrial fibrillation-permanent  Bradycardia 40s-50s  Hypotension interfering with dialysis  Hypertension  End-stage renal disease on renal replacement therapy  Pulmonary hypertension  Coronary artery disease with prior stenting  The patient has bradycardia and the question to [place] is when she benefit from pacing largely for the purpose of being able to sustain dialysis.  I am not sure that this is the case.  Looking back over her data sets, she has had low blood pressure and relatively normal heart rates i.e. 50s and 60s and 70s.  Even this morning, she had a heart rate of 80 with a blood pressure of 75.  This being the case, I think it is unlikely that pacing is going to afford sufficient blood pressure support to justify his undertaking and that would allow  dialysis.  Furthermore, she has been noted to have pulmonary hypertension.  The degree needs to be ascertained as I wonder whether this is not also potentially contributing to the failure to dialyze.  Once we have greater clarity on some of these issues we can think about the role of pacing although I am not at all sanguine that we will accomplish the blood pressure support that is being sought

## 2023-06-10 NOTE — Progress Notes (Addendum)
NAME:  Jacqueline [name], MRN:  [id number], DOB:  1946-02-13, LOS: 3 ADMISSION DATE:  06/07/2023, CONSULTATION DATE:  06/07/2023 REFERRING MD:  Dr. Allena Katz, CHIEF COMPLAINT:  Hypotension   Brief Pt Description / Synopsis:  77 y.o female with PMHx most significant for chronic hypotension (SBP in 80's on Midodrine 10 mg TID) and ESRD on HD (TTS) admitted with Cardiogenic shock due to Bradycardia.  EP requesting transfer to [facility] for Leadless Pacemaker placement.  History of Present Illness:  77 y.o female with significant PMH of HTN, T2DM,ESRD on HD TTS, Chronic debility , CAD s/p stent, PAF,  OSA, PAD, Left knee arthritis, Chronic lower back pain,GERD,Obesity , HLD  who presented to the ED  from Dialysis with chief complaints of generalized weakness x 4 days.   EMS report: NVD and LLQ abdominal pain since last night. States tried to eat yesterday, each time she ate she vomited, then had diarrhea last night. No NVD today. She went to Dialysis today and was noted with low BP of 72/42. She usually runs lows BP and takes Midodrine but today her BP was lower than usually so she was not dialyzed but was sent to ED for further evaluation.   ED Course:  Initial vital signs showed HR of 50 beats/minute, BP 81/51 mm Hg, the RR 16 breaths/minute, and the oxygen saturation 91% on 2L and a temperature of 97.53F (36.6C).   Pertinent Labs/Diagnostics.   Na+/ K+: 133/3.4 Glucose:130 BUN/Cr.:55/7.76 WBC:11.5 Plts: 146 PCT: pending Lactic acid: 1.3 EKG:?Afib vs junctional bradycardia rate 40bmp. Incomplete RBB,   CXR, CT scan obtained and show no signs of infection or other abnormality. Patient given, midodrine, atropine for significant bradycardia and  500 cc of fluids and started on broad-spectrum antibiotics Vanco cefepime and Flagyl for  suspected sepsis of unknown source. Patient remained hypotensive despite  intervention as above. PCCM consulted for possible pressors.  Pertinent  Medical History     Arthritis       "left knee" (12/04/2016)   CAD (coronary artery disease)      a. 12/2016 NSTEMI/PCI: LM nl, LaD 20p/d, D1 20, OM2 20, OM3 20, RCA 31m (3.0x18 Resolute DES). EF 65%.   Chronic lower back pain     ESRD on dialysis [facility])      "[facility]; [address]; TTS" (12/04/2016)   GERD (gastroesophageal reflux disease)     Hyperlipidemia     Hypertension      a. 12/2016 labetalol d/c'd 2/2 hypotension in HD.   Morbid obesity (HCC)     Obesity     Pneumonia      "couple times" (12/04/2016)   Type II diabetes mellitus (HCC)     Micro Data:  7/6: Blood culture x2>> no growth to date 7/6: MRSA PCR>>negative  Antimicrobials:   Anti-infectives (From admission, onward)    Start     Dose/Rate Route Frequency Ordered Stop   06/10/23 1200  vancomycin (VANCOCIN) IVPB 1000 mg/200 mL premix  Status:  Discontinued       See Hyperspace for full Linked Orders Report.   1,000 mg 200 mL/hr over 60 Minutes Intravenous Every T-Th-Sa (Hemodialysis) 06/08/23 1058 06/09/23 1029   06/08/23 1145  vancomycin (VANCOCIN) IVPB 1000 mg/200 mL premix       See Hyperspace for full Linked Orders Report.   1,000 mg 200 mL/hr over 60 Minutes Intravenous  Once 06/08/23 1058 06/08/23 1655   06/08/23 1100  piperacillin-tazobactam (ZOSYN) IVPB 2.25 g  Status:  Discontinued  2.25 g 100 mL/hr over 30 Minutes Intravenous Every 8 hours 06/08/23 1058 06/09/23 1029   06/07/23 1400  metroNIDAZOLE (FLAGYL) IVPB 500 mg        500 mg 100 mL/hr over 60 Minutes Intravenous  Once 06/07/23 1346 06/07/23 1652   06/07/23 1400  ceFEPIme (MAXIPIME) 2 g in sodium chloride 0.9 % 100 mL IVPB        2 g 200 mL/hr over 30 Minutes Intravenous  Once 06/07/23 1349 06/07/23 1512   06/07/23 1400  vancomycin (VANCOCIN) IVPB 1000 mg/200 mL premix        1,000 mg 200 mL/hr over 60 Minutes Intravenous  Once 06/07/23 1349 06/07/23 1652        Significant Hospital Events: Including procedures, antibiotic start and stop  dates in addition to other pertinent events   06/07/2023: admit to ICU with bradycardia, started on dopamine 06/08/2023: weaned off dopamine, hypotensive again, dopamine restarted 06/09/2023:  Remains hypotensive requiring Levophed, (pt is vasculopath, doubt accuracy of cuff BP as pt is awake, alert, and asymptomatic).  Attempted Arterial line but unsuccessful, Vascular Surgery consulted to assist with placement. Nephrology consulted, received HD. 06/10/2023: Continues to require Levophed (6 mcg currently), when Levo weaned down does exhibit some Bradycardia in the upper 50's.  Cardiology considering R Heart Cath today. Holding on transfer to [facility] for pacer at this time.  Interim History / Subjective:  -Arterial line was placed by Vascular Surgery yesterday for accurate BP readings -Tolerated HD yesterday with Levophed -No significant events noted overnight -Afebrile, on Levophed (6 mcg) ~ when Levophed weaned down did develop bradycardia in upper 50's -Awake and alert, oriented, asymptomatic -EP/Cardiology considering R Heart Cath today for further evaluation of CAD and pulmonary HTN, plan to hold off on Transfer at time to [facility] for Leadless Pacemaker placement ~ will resume Heparin gtt -Leukocytosis resolved off of ABX  Objective   Blood pressure (!) 148/44, pulse 91, temperature 98.7 F (37.1 C), temperature source Oral, resp. rate 16, height 5\' 4"  (1.626 m), weight 117.8 kg, SpO2 98 %.        Intake/Output Summary (Last 24 hours) at 06/10/2023 0758 Last data filed at 06/10/2023 0600 Gross per 24 hour  Intake 1887.28 ml  Output 0 ml  Net 1887.28 ml    Filed Weights   06/09/23 0500 06/09/23 1622 06/10/23 0411  Weight: 115.5 kg 115.4 kg 117.8 kg    Examination: General: Acute on chronically ill-appearing obese female, laying in bed, on 2 L nasal cannula, no acute distress HENT: Atraumatic, normocephalic, neck supple, +JVD Lungs: Distant breath sounds due to body habitus, even,  nonlabored Cardiovascular: Irregular irregular rhythm, rate controlled, no murmurs, rubs, gallops Abdomen: Obese, soft, nontender, no guarding or rebound tenderness, bowel sounds positive x 4 Extremities: Normal bulk and tone, no deformities, trace edema bilateral lower extremities Neuro: Awake and alert, oriented x 3, moves all extremities to command, no focal deficits, speech clear GU: Deferred  Resolved Hospital Problem list     Assessment & Plan:   #Circulatory Shock: Suspect Cardiogenic given Bradycardia  #Afib with slow ventricular rate ~ RATE IMPROVED PMHx: CAD, HLD, permanent A. Fib, chronic hypotension with SBP in 80's on Midodrine 10 mg TID Echocardiogram 06/08/23: LVEF 60-65%, indeterminate diastolic parameters, RV systolic function mildly reduced, severe pulmonary hypertension, moderate tricuspid regurgitation -Continuous cardiac monitoring -Maintain MAP >65 -Vasopressors as needed to maintain MAP goal -Lactic acid is normalized -Continue outpatient Midodrine 10 mg TID -TSH normal at 1.49 -Cortisol  is 6.9 ~ Continue Fludrocortisone -Cardiology &  following, appreciate input ~ Plan for R Heart Cath for further evaluation of CAD and Pulmonary HTN ~ EP recommends holding off on transfer to [facility] at this time for Leadless Pacemaker placement -Resume Heparin gtt anticoagulation for a.fib (Holding home Eliquis for procedure)  #Initial Concern for Sepsis of unknown etiology ~ LOW SUSPICION FOR SEPSIS No clear sign of sepsis though patient is hypotensive. Blood cultures are drawn and are negative to date.  CXR without sign of pneumonia, unable to make urine -Monitor fever curve -Trend WBC's & Procalcitonin -Follow cultures as above -Discussed with Dr. Belia Heman, will monitor off ABX 7/8 pending cultures & sensitivities, with low threshold to resume ABX  #ESRD on HD  #Hyponatremia #AG Metabolic Acidosis -Monitor I&O's / urinary output -Follow BMP -Ensure adequate renal  perfusion -Avoid nephrotoxic agents as able -Replace electrolytes as indicated -Nephrology following, appreciate input ~ HD vs CRRT as per Nephrology  #Mild Thrombocytopenia -Monitor for S/Sx of bleeding -Trend CBC -Continue Heparin gtt for Anticoagulation/VTE Prophylaxis  -Transfuse for Hgb <7  #T2DM  -CBG's q4h; Target range of 140 to 180 -SSI -Follow ICU Hypo/Hyperglycemia protocol    Best Practice (right click and "Reselect all SmartList Selections" daily)   Diet/type: NPO for potential Cath DVT prophylaxis: Heparin gtt GI prophylaxis: N/A Lines: Right radial A-line, and is still needed Foley:  N/A Code Status:  full code Last date of multidisciplinary goals of care discussion [7/9]  7/9: Pt updated at bedside.  All questions answered.  Labs   CBC: Recent Labs  Lab 06/07/23 1254 06/08/23 0726 06/09/23 1238 06/10/23 0445  WBC 11.5* 12.4* 11.2* 10.2  HGB 12.4 12.6 10.7* 10.3*  HCT 41.0 40.2 33.8* 32.8*  MCV 102.0* 98.8 97.1 97.6  PLT 146* 138* 120* 114*     Basic Metabolic Panel: Recent Labs  Lab 06/07/23 1254 06/08/23 0726 06/09/23 0350 06/10/23 0445  NA 133* 133* 131* 133*  K 3.4* 4.1 3.8 3.2*  CL 92* 92* 91* 94*  CO2 27 25 23 26   GLUCOSE 130* 162* 219* 175*  BUN 55* 62* 72* 39*  CREATININE 7.76* 8.41* 9.89* 5.89*  CALCIUM 7.7* 7.7* 7.2* 7.3*  MG  --  2.0 1.8  --   PHOS  --  5.3* 6.0* 3.2    GFR: Estimated Creatinine Clearance: 9.8 mL/min (A) (by C-G formula based on SCr of 5.89 mg/dL (H)). Recent Labs  Lab 06/07/23 1254 06/08/23 0726 06/09/23 1238 06/09/23 1739 06/10/23 0445  WBC 11.5* 12.4* 11.2*  --  10.2  LATICACIDVEN 1.3  --  0.7 0.6  --      Liver Function Tests: Recent Labs  Lab 06/10/23 0445  ALBUMIN 3.0*   Recent Labs  Lab 06/07/23 1254  LIPASE 51    No results for input(s): "AMMONIA" in the last 168 hours.  ABG    Component Value Date/Time   PHART 7.43 05/21/2022 2300   PCO2ART 42 05/21/2022 2300   PO2ART  66 (L) 05/21/2022 2300   HCO3 27.9 05/21/2022 2300   TCO2 31 01/19/2014 1614   ACIDBASEDEF 1.0 01/19/2014 1238   O2SAT 95.2 05/21/2022 2300     Coagulation Profile: No results for input(s): "INR", "PROTIME" in the last 168 hours.  Cardiac Enzymes: No results for input(s): "CKTOTAL", "CKMB", "CKMBINDEX", "TROPONINI" in the last 168 hours.  HbA1C: Hemoglobin A1C  Date/Time Value Ref Range Status  06/12/2021 12:00 AM 6.6  Final   Hgb A1c MFr Bld  Date/Time  Value Ref Range Status  06/07/2023 07:44 PM 5.8 (H) 4.8 - 5.6 % Final    Comment:    (NOTE) Pre diabetes:          5.7%-6.4%  Diabetes:              >6.4%  Glycemic control for   <7.0% adults with diabetes   09/29/2022 04:55 PM 5.2 4.8 - 5.6 % Final    Comment:    (NOTE) Pre diabetes:          5.7%-6.4%  Diabetes:              >6.4%  Glycemic control for   <7.0% adults with diabetes     CBG: Recent Labs  Lab 06/09/23 1743 06/09/23 1947 06/10/23 0016 06/10/23 0339 06/10/23 0744  GLUCAP 139* 94 164* 167* 134*     Review of Systems:   Positives in BOLD: Gen: Denies fever, chills, weight change, fatigue, night sweats HEENT: Denies blurred vision, double vision, hearing loss, tinnitus, sinus congestion, rhinorrhea, sore throat, neck stiffness, dysphagia PULM: Denies shortness of breath, cough, sputum production, hemoptysis, wheezing CV: Denies chest pain, edema, orthopnea, paroxysmal nocturnal dyspnea, palpitations GI: Denies abdominal pain, nausea, vomiting, diarrhea, hematochezia, melena, constipation, change in bowel habits GU: Denies dysuria, hematuria, polyuria, oliguria, urethral discharge Endocrine: Denies hot or cold intolerance, polyuria, polyphagia or appetite change Derm: Denies rash, dry skin, scaling or peeling skin change Heme: Denies easy bruising, bleeding, bleeding gums Neuro: Denies headache, numbness, weakness, slurred speech, loss of memory or consciousness   Past Medical History:   She,  has a past medical history of Arthritis, CAD (coronary artery disease), Chronic lower back pain, Diverticulosis, ESRD on dialysis (HCC), GERD (gastroesophageal reflux disease), Hiatal hernia, History of echocardiogram, Hyperlipidemia, Hypertension, Morbid obesity (HCC), Obesity, Persistent atrial fibrillation (HCC), Pneumonia, and Type II diabetes mellitus (HCC).   Surgical History:   Past Surgical History:  Procedure Laterality Date   ABDOMINAL HYSTERECTOMY     AV FISTULA PLACEMENT Left 01/27/2014   Procedure: ARTERIOVENOUS (AV) FISTULA CREATION;  Surgeon: Pryor Ochoa, MD;  Location: [facility] OR;  Service: Vascular;  Laterality: Left;   AV FISTULA REPAIR     "had it cleaned out"   CARDIAC CATHETERIZATION  ?1980s   CARDIAC CATHETERIZATION N/A 12/04/2016   Procedure: Left Heart Cath Angiography;  Surgeon: Kathleene Hazel, MD;  Location: [facility] INVASIVE CV LAB;  Service: Cardiovascular;  Laterality: N/A;   CARDIAC CATHETERIZATION N/A 12/04/2016   Procedure: Coronary Stent Intervention;  Surgeon: Kathleene Hazel, MD;  Location: [facility] INVASIVE CV LAB;  Service: Cardiovascular;  Laterality: N/A;  Mid RCA   CATARACT EXTRACTION W/ INTRAOCULAR LENS IMPLANT Left    COLONOSCOPY W/ BIOPSIES AND POLYPECTOMY     CORONARY ANGIOPLASTY WITH STENT PLACEMENT  12/04/2016   ESOPHAGOGASTRODUODENOSCOPY     INSERTION OF DIALYSIS CATHETER N/A 01/19/2014   Procedure: INSERTION OF DIALYSIS CATHETER;  Surgeon: Larina Earthly, MD;  Location: [facility] OR;  Service: Vascular;  Laterality: N/A;     Social History:   reports that she quit smoking about 39 years ago. Her smoking use included cigarettes. She has a 20.00 pack-year smoking history. She has never used smokeless tobacco. She reports that she does not drink alcohol and does not use drugs.   Family History:  Her family history includes Breast cancer in her sister; CAD in her father and sister; Cancer in her brother; Diabetes in her mother; Heart attack in her  father; Heart disease in  her father; Hypertension in her father; Peripheral vascular disease in her mother.   Allergies Allergies  Allergen Reactions   Other Other (See Comments)   Sulfa Antibiotics Other (See Comments)   Sulfa Drugs Cross Reactors Other (See Comments)    Doesn't remember      Home Medications  Prior to Admission medications   Medication Sig Start Date End Date Taking? Authorizing Provider  acetaminophen (TYLENOL) 650 MG CR tablet Take 1,300 mg by mouth every 8 (eight) hours as needed for pain.    [provider]  albuterol (VENTOLIN HFA) 108 (90 Base) MCG/ACT inhaler Inhale 1-2 puffs into the lungs every 6 (six) hours as needed for wheezing or shortness of breath. 12/15/21   Franne Forts, DO  apixaban (ELIQUIS) 5 MG TABS tablet Take 1 tablet (5 mg total) by mouth 2 (two) times daily. 10/02/22   Tresa Moore, MD  B Complex-C-Zn-Folic Acid (DIALYVITE/ZINC) TABS Take 1 tablet by mouth daily.    [provider]  bisacodyl (DULCOLAX) 5 MG EC tablet Take 2 tablets (10 mg total) by mouth daily as needed for moderate constipation. 10/02/22   Tresa Moore, MD  cinacalcet (SENSIPAR) 30 MG tablet Take 30 mg by mouth daily with supper.    [provider]  ezetimibe (ZETIA) 10 MG tablet Take 1 tablet (10 mg total) by mouth daily. 04/30/22   Iran Ouch, MD  ferric citrate (AURYXIA) 1 GM 210 MG(Fe) tablet Take 420 mg by mouth 3 (three) times daily with meals.    [provider]  fexofenadine (ALLEGRA) 180 MG tablet Take 180 mg by mouth daily.    [provider]  midodrine (PROAMATINE) 10 MG tablet Take 20 mg by mouth daily. (Take additional two tablets [20mg ] for administration at dialysis on Tuesday, Thursday and Saturday)    [provider]  midodrine (PROAMATINE) 10 MG tablet Take 20 mg by mouth daily as needed (for SBP <100).    [provider]  Omega-3 Fatty Acids (FISH OIL) 1000 MG CPDR Take 1,000 mg  by mouth daily.    [provider]  omeprazole (PRILOSEC) 20 MG capsule Take 1 capsule (20 mg total) by mouth daily. Patient taking differently: Take 20 mg by mouth 2 (two) times daily. 02/21/22   Arnette Felts, FNP  ondansetron (ZOFRAN-ODT) 4 MG disintegrating tablet Take 4 mg by mouth every 8 (eight) hours as needed for nausea or vomiting.    [provider]  polyethylene glycol (MIRALAX / GLYCOLAX) 17 g packet Take 17 g by mouth at bedtime.    [provider]  pregabalin (LYRICA) 100 MG capsule Take 100 mg by mouth at bedtime.    [provider]  pregabalin (LYRICA) 50 MG capsule Take 50 mg by mouth 2 (two) times daily.    [provider]  rosuvastatin (CRESTOR) 10 MG tablet Take 1 tablet (10 mg total) by mouth daily. 10/03/22   Tresa Moore, MD  sennosides-docusate sodium (SENOKOT-S) 8.6-50 MG tablet Take 1 tablet by mouth 2 (two) times daily.    [provider]  trolamine salicylate (ASPERCREME) 10 % cream Apply 1 Application topically 2 (two) times daily. (Apply to knees)    [provider]     Critical care time: 40 minutes     Harlon Ditty, AGACNP-BC [facility] Prefer epic messenger for cross cover needs If after hours, please call E-link

## 2023-06-10 NOTE — Progress Notes (Addendum)
ANTICOAGULATION CONSULT NOTE - Initial Consult  Pharmacy Consult for IV heparin Indication: atrial fibrillation  Allergies  Allergen Reactions   Sulfa Antibiotics Other (See Comments)   Sulfa Drugs Cross Reactors Other (See Comments)    Doesn't remember     Patient Measurements: Height: 5\' 4"  (162.6 cm) Weight: 117.8 kg (259 lb 11.2 oz) IBW/kg (Calculated) : 54.7 Heparin Dosing Weight: 115.5 kg  Vital Signs: Temp: 98.6 F (37 C) (07/09 1200) Temp Source: Oral (07/09 1200) Pulse Rate: 82 (07/09 1515)  Labs: Recent Labs    06/08/23 0726 06/09/23 0350 06/09/23 1238 06/09/23 2314 06/10/23 0445 06/10/23 1740  HGB 12.6  --  10.7*  --  10.3*  --   HCT 40.2  --  33.8*  --  32.8*  --   PLT 138*  --  120*  --  114*  --   APTT  --   --   --  >200*  --  >200*  HEPARINUNFRC  --   --   --   --  >1.10*  --   CREATININE 8.41* 9.89*  --   --  5.89*  --      Estimated Creatinine Clearance: 9.8 mL/min (A) (by C-G formula based on SCr of 5.89 mg/dL (H)).   Medical History: Past Medical History:  Diagnosis Date   Arthritis    "left knee" (12/04/2016)   CAD (coronary artery disease)    a. 12/2016 NSTEMI/PCI: LM nl, LaD 20p/d, D1 20, OM2 20, OM3 20, RCA 60m (3.0x18 Resolute DES). EF 65%.   Chronic lower back pain    Diverticulosis    on CT   ESRD on dialysis [facility])    GERD (gastroesophageal reflux disease)    Hiatal hernia    History of echocardiogram    a. 09/2022 Echo: EF 60-65%, mild asymm LVH, mod reduced RV fxn, RVSP . Mildly dil RA. Mild AI. Ao sclerosis. Mild MR.   Hyperlipidemia    Hypertension    a. 12/2016 labetalol d/c'd 2/2 hypotension in HD.   Morbid obesity (HCC)    Obesity    Persistent atrial fibrillation (HCC)    a. 05/2023 Zio: 100% afib avg HR 52 (32-99).   Pneumonia    "couple times" (12/04/2016)   Type II diabetes mellitus (HCC)     Medications:  Eliquis prior to admission (last dose 7/7 @2100 )  Assessment: 77 year old female admitted with  circulatory shock, of which has improved. Plan is for PPM, and thus holding Eliquis prior to procedure.   Goal of Therapy:  aPTT 66-102 seconds Monitor platelets by anticoagulation protocol: Yes  07/08 2314 aPTT > 200, Supratherapeutic @ 1800 units/hr 07/09 1750 aPTT > 200, supratherapeutic 1800 > 1400 units/hr   Plan:  Heparin remains supratherapeutic Contacted RN will hold infusion x 1 hour Reduce heparin rate to 1400 units/hr aPTT 8 hours after rate change CBC daily   Sharen Hones, PharmD, BCPS Clinical Pharmacist   06/10/2023 7:30 PM

## 2023-06-10 NOTE — TOC Progression Note (Signed)
Transition of Care [facility]) - Progression Note    Patient Details  Name: Jacqueline [name] MRN: [id number] Date of Birth: Apr 30, 1946  Transition [facility]) CM/SW Contact  Kreg Shropshire, RN Phone Number: 06/10/2023, 9:05 AM  Clinical Narrative:     D/c orders in for pt to be transferred to [facility] for Pacemaker. Cm left voicemail to Golinda at [employer] to inform about pt being transferred to [facility]. Cm will continue to follow for updates  Expected Discharge Plan: Long Term Nursing Home Barriers to Discharge: Continued Medical Work up  Expected Discharge Plan and Services       Living arrangements for the past 2 months: Skilled Nursing Facility Expected Discharge Date: 06/10/23                                     Social Determinants of Health (SDOH) Interventions SDOH Screenings   Food Insecurity: No Food Insecurity (06/07/2023)  Housing: Low Risk  (06/07/2023)  Transportation Needs: No Transportation Needs (06/07/2023)  Utilities: Not At Risk (06/07/2023)  Depression (PHQ2-9): Low Risk  (06/27/2021)  Financial Resource Strain: Medium Risk (06/27/2021)  Physical Activity: Inactive (06/27/2021)  Stress: No Stress Concern Present (06/27/2021)  Tobacco Use: Medium Risk (06/08/2023)    Readmission Risk Interventions    05/22/2022   12:26 PM  Readmission Risk Prevention Plan  Transportation Screening Complete  PCP or Specialist Appt within 3-5 Days Complete  HRI or Home Care Consult Complete  Social Work Consult for Recovery Care Planning/Counseling Complete  Palliative Care Screening Complete  Medication Review Oceanographer) Complete

## 2023-06-10 NOTE — Progress Notes (Signed)
[facility]  ROUNDING NOTE   Subjective:   Patient seen sitting up  in bed Alert Remains on 2L [place] Levo infusing Heparin drip in place  Objective:  Vital signs in last 24 hours:  Temp:  [98 F (36.7 C)-98.7 F (37.1 C)] 98.7 F (37.1 C) (07/09 0400) Pulse Rate:  [59-92] 77 (07/09 0810) Resp:  [13-22] 16 (07/09 0900) BP: (59-148)/(33-98) 148/44 (07/08 1949) SpO2:  [85 %-100 %] 95 % (07/09 0810) Arterial Line BP: (82-163)/(26-49) 163/49 (07/09 0900) Weight:  [115.4 kg-117.8 kg] 117.8 kg (07/09 0411)  Weight change: -0.1 kg Filed Weights   06/09/23 0500 06/09/23 1622 06/10/23 0411  Weight: 115.5 kg 115.4 kg 117.8 kg    Intake/Output: I/O last 3 completed shifts: In: 2848.9 [P.O.:1417; I.V.:1331.9; IV Piggyback:100] Out: 0    Intake/Output this shift:  No intake/output data recorded.  Physical Exam: General: NAD  Head: Normocephalic, atraumatic. Moist oral mucosal membranes  Eyes: Anicteric  Lungs:  Clear to auscultation, normal effort  Heart: Regular rate and rhythm  Abdomen:  Soft, nontender,   Extremities:  Trace peripheral edema.  Neurologic: Alert and oriented, moving all four extremities  Skin: No lesions  Access: Lt AVF    Basic Metabolic Panel: Recent Labs  Lab 06/07/23 1254 06/08/23 0726 06/09/23 0350 06/10/23 0445  NA 133* 133* 131* 133*  K 3.4* 4.1 3.8 3.2*  CL 92* 92* 91* 94*  CO2 27 25 23 26   GLUCOSE 130* 162* 219* 175*  BUN 55* 62* 72* 39*  CREATININE 7.76* 8.41* 9.89* 5.89*  CALCIUM 7.7* 7.7* 7.2* 7.3*  MG  --  2.0 1.8  --   PHOS  --  5.3* 6.0* 3.2    Liver Function Tests: Recent Labs  Lab 06/10/23 0445  ALBUMIN 3.0*   Recent Labs  Lab 06/07/23 1254  LIPASE 51   No results for input(s): "AMMONIA" in the last 168 hours.  CBC: Recent Labs  Lab 06/07/23 1254 06/08/23 0726 06/09/23 1238 06/10/23 0445  WBC 11.5* 12.4* 11.2* 10.2  HGB 12.4 12.6 10.7* 10.3*  HCT 41.0 40.2 33.8* 32.8*  MCV 102.0* 98.8 97.1  97.6  PLT 146* 138* 120* 114*    Cardiac Enzymes: No results for input(s): "CKTOTAL", "CKMB", "CKMBINDEX", "TROPONINI" in the last 168 hours.  BNP: Invalid input(s): "POCBNP"  CBG: Recent Labs  Lab 06/09/23 1743 06/09/23 1947 06/10/23 0016 06/10/23 0339 06/10/23 0744  GLUCAP 139* 94 164* 167* 134*    Microbiology: Results for orders placed or performed during the hospital encounter of 06/07/23  Blood culture (routine x 2)     Status: None (Preliminary result)   Collection Time: 06/07/23 12:54 PM   Specimen: BLOOD  Result Value Ref Range Status   Specimen Description BLOOD RIGHT ANTECUBITAL  Final   Special Requests   Final    BOTTLES DRAWN AEROBIC AND ANAEROBIC Blood Culture adequate volume   Culture   Final    NO GROWTH 3 DAYS Performed at [facility], [address], [place], [place] [address]    Report Status PENDING  Incomplete  Blood culture (routine x 2)     Status: None (Preliminary result)   Collection Time: 06/07/23 12:54 PM   Specimen: BLOOD  Result Value Ref Range Status   Specimen Description BLOOD BLOOD RIGHT FOREARM  Final   Special Requests   Final    BOTTLES DRAWN AEROBIC AND ANAEROBIC Blood Culture adequate volume   Culture   Final    NO GROWTH 3  DAYS Performed at [facility], [address] [place], [place] [address]    Report Status PENDING  Incomplete  MRSA Next Gen by PCR, Nasal     Status: None   Collection Time: 06/07/23 10:18 PM   Specimen: Nasal Mucosa; Nasal Swab  Result Value Ref Range Status   MRSA by PCR Next Gen NOT DETECTED NOT DETECTED Final    Comment: (NOTE) The GeneXpert MRSA Assay (FDA approved for NASAL specimens only), is one component of a comprehensive MRSA colonization surveillance program. It is not intended to diagnose MRSA infection nor to guide or monitor treatment for MRSA infections. Test performance is not FDA approved in patients less than 45 years old. Performed at [facility],  [address] [place], [place] [address]     Coagulation Studies: No results for input(s): "LABPROT", "INR" in the last 72 hours.  Urinalysis: No results for input(s): "COLORURINE", "LABSPEC", "PHURINE", "GLUCOSEU", "HGBUR", "BILIRUBINUR", "KETONESUR", "PROTEINUR", "UROBILINOGEN", "NITRITE", "LEUKOCYTESUR" in the last 72 hours.  Invalid input(s): "APPERANCEUR"    Imaging: ECHOCARDIOGRAM COMPLETE  Result Date: 06/08/2023    ECHOCARDIOGRAM REPORT   Patient Name:   Jacqueline [name] Date of Exam: 06/08/2023 Medical Rec #:  [id number]         Height:       64.0 in Accession #:    [id number]        Weight:       254.9 lb Date of Birth:  1946-11-06          BSA:          2.169 m Patient Age:    77 years          BP:           100/59 mmHg Patient Gender: F                 HR:           63 bpm. Exam Location:  [facility] Procedure: 2D Echo, Color Doppler and Cardiac Doppler Indications:     CHF I50.31  History:         Patient has prior history of Echocardiogram examinations, most                  recent 09/09/2022. CHF, CAD, ESRD, Signs/Symptoms:Chest Pain;                  Risk Factors:Dyslipidemia, Diabetes, Hypertension and Former                  Smoker.  Sonographer:     Dondra Prader RVT RCS Referring Phys:  [id number] Hubbard Hartshorn OUMA Diagnosing Phys: Julien Nordmann MD  Sonographer Comments: Patient is obese. Image acquisition challenging due to patient body habitus. IMPRESSIONS  1. Left ventricular ejection fraction, by estimation, is 60 to 65%. The left ventricle has normal function. The left ventricle has no regional wall motion abnormalities. Left ventricular diastolic parameters are indeterminate.  2. Right ventricular systolic function is moderately reduced. The right ventricular size is moderately enlarged. There is severely elevated pulmonary artery systolic pressure. The estimated right ventricular systolic pressure is 79.0 mmHg.  3. Left atrial size was mildly dilated.  4. The mitral valve is  normal in structure. Mild mitral valve regurgitation. No evidence of mitral stenosis.  5. Tricuspid valve regurgitation is moderate.  6. The aortic valve is normal in structure. Aortic valve regurgitation is not visualized. Aortic valve sclerosis is present, with no evidence of aortic  valve stenosis.  7. The inferior vena cava is dilated in size with <50% respiratory variability, suggesting right atrial pressure of 15 mmHg. FINDINGS  Left Ventricle: Left ventricular ejection fraction, by estimation, is 60 to 65%. The left ventricle has normal function. The left ventricle has no regional wall motion abnormalities. The left ventricular internal cavity size was normal in size. There is  no left ventricular hypertrophy. Left ventricular diastolic parameters are indeterminate. Right Ventricle: The right ventricular size is moderately enlarged. No increase in right ventricular wall thickness. Right ventricular systolic function is moderately reduced. There is severely elevated pulmonary artery systolic pressure. The tricuspid regurgitant velocity is 4.00 m/s, and with an assumed right atrial pressure of 15 mmHg, the estimated right ventricular systolic pressure is 79.0 mmHg. Left Atrium: Left atrial size was mildly dilated. Right Atrium: Right atrial size was normal in size. Pericardium: There is no evidence of pericardial effusion. Mitral Valve: The mitral valve is normal in structure. Mild mitral valve regurgitation. No evidence of mitral valve stenosis. Tricuspid Valve: The tricuspid valve is normal in structure. Tricuspid valve regurgitation is moderate . No evidence of tricuspid stenosis. Aortic Valve: The aortic valve is normal in structure. Aortic valve regurgitation is not visualized. Aortic valve sclerosis is present, with no evidence of aortic valve stenosis. Aortic valve mean gradient measures 6.0 mmHg. Aortic valve peak gradient measures 10.5 mmHg. Aortic valve area, by VTI measures 1.88 cm. Pulmonic Valve:  The pulmonic valve was normal in structure. Pulmonic valve regurgitation is trivial. No evidence of pulmonic stenosis. Aorta: The aortic root is normal in size and structure. Venous: The inferior vena cava is dilated in size with less than 50% respiratory variability, suggesting right atrial pressure of 15 mmHg. IAS/Shunts: No atrial level shunt detected by color flow Doppler.  LEFT VENTRICLE PLAX 2D LVIDd:         5.00 cm   Diastology LVIDs:         3.20 cm   LV e' medial:    6.81 cm/s LV PW:         0.90 cm   LV E/e' medial:  14.8 LV IVS:        1.30 cm   LV e' lateral:   9.32 cm/s LVOT diam:     1.80 cm   LV E/e' lateral: 10.8 LV SV:         58 LV SV Index:   27 LVOT Area:     2.54 cm  RIGHT VENTRICLE             IVC RV Basal diam:  4.30 cm     IVC diam: 2.00 cm RV Mid diam:    4.60 cm RV S prime:     12.10 cm/s TAPSE (M-mode): 1.6 cm LEFT ATRIUM             Index        RIGHT ATRIUM           Index LA diam:        4.60 cm 2.12 cm/m   RA Area:     19.60 cm LA Vol (A2C):   78.1 ml 36.01 ml/m  RA Volume:   56.40 ml  26.01 ml/m LA Vol (A4C):   56.3 ml 25.96 ml/m LA Biplane Vol: 71.6 ml 33.02 ml/m  AORTIC VALVE                     PULMONIC VALVE AV Area (Vmax):  1.59 cm      PV Vmax:       0.93 m/s AV Area (Vmean):   1.54 cm      PV Peak grad:  3.4 mmHg AV Area (VTI):     1.88 cm AV Vmax:           162.00 cm/s AV Vmean:          112.500 cm/s AV VTI:            0.310 m AV Peak Grad:      10.5 mmHg AV Mean Grad:      6.0 mmHg LVOT Vmax:         101.00 cm/s LVOT Vmean:        68.100 cm/s LVOT VTI:          0.229 m LVOT/AV VTI ratio: 0.74  AORTA Ao Root diam: 2.60 cm Ao Asc diam:  3.00 cm MITRAL VALVE                TRICUSPID VALVE MV Area (PHT): 3.31 cm     TR Peak grad:   64.0 mmHg MV Decel Time: 229 msec     TR Mean grad:   39.0 mmHg MV E velocity: 101.00 cm/s  TR Vmax:        400.00 cm/s                             TR Vmean:       291.0 cm/s                              SHUNTS                              Systemic VTI:  0.23 m                             Systemic Diam: 1.80 cm Julien Nordmann MD Electronically signed by Julien Nordmann MD Signature Date/Time: 06/08/2023/3:07:42 PM    Final      Medications:    sodium chloride 10 mL/hr at 06/10/23 0600   anticoagulant sodium citrate     heparin 1,800 Units/hr (06/10/23 1016)   norepinephrine (LEVOPHED) Adult infusion 7 mcg/min (06/10/23 0649)    Chlorhexidine Gluconate Cloth  6 each Topical Daily   cinacalcet  30 mg Oral Q supper   ezetimibe  10 mg Oral Daily   feeding supplement (NEPRO CARB STEADY)  237 mL Oral TID BM   ferric citrate  420 mg Oral TID WC   fludrocortisone  0.1 mg Oral Daily   insulin aspart  0-6 Units Subcutaneous Q4H   loratadine  10 mg Oral Daily   midodrine  10 mg Oral TID WC   multivitamin  1 tablet Oral QHS   pregabalin  100 mg Oral QHS   pregabalin  50 mg Oral BID   rosuvastatin  10 mg Oral Daily   senna-docusate  1 tablet Oral BID   acetaminophen, alteplase, anticoagulant sodium citrate, bisacodyl, docusate sodium, heparin, lidocaine (PF), lidocaine-prilocaine, ondansetron (ZOFRAN) IV, pentafluoroprop-tetrafluoroeth, polyethylene glycol  Assessment/ Plan:  Jacqueline [name] is a 77 y.o.  female  with a PMHx of ESRD on HD TTS at [facility], diabetes mellitus type 2, chronic hypotension, peripheral arterial disease, obstructive  sleep apnea, GERD, obesity, hyperlipidemia, anemia chronic kidney disease, secondary hyperparathyroidism, who was admitted to [facility] on 06/07/2023 for evaluation of generalized weakness.   1  ESRD on HD TTS.  Patient noted to be hypotensive worse than her prior baseline.  Patient was on Levophed earlier in the morning but now off.  Case discussed with critical care.    Patient was able to tolerated hemodialysis yesterday, no UF. Blood pressure remained stable. Next treatment scheduled for Wednesday, post heart cath.   2.  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  Systolic blood pressure  usually in the 80s as an outpatient.  She is on midodrine 10 mg 3 times daily as outpatient.  Appreciate placement of A-line for BP monitoring. Cardiology following and are planning [facility] tomorrow.   3. Anemia of chronic kidney disease Lab Results  Component Value Date   HGB 10.3 (L) 06/10/2023    Hgb stable. Patient receives Mircera at outpatient clinic.   4. Diabetes mellitus type II with chronic kidney disease/renal manifestations: noninsulin dependent.  Most recent hemoglobin A1c is 5.8 on 06/07/23.   5. Secondary Hyperparathyroidism: with outpatient labs: None   Lab Results  Component Value Date   PTH 388.6 (H) 01/17/2014   CALCIUM 7.3 (L) 06/10/2023   CAION 1.12 10/16/2011   PHOS 3.2 06/10/2023    Prescribed cinacalcet and auryxia outpatient.    LOS: 3 Jerriann Schrom 7/9/202410:56 AM

## 2023-06-10 NOTE — Plan of Care (Signed)

## 2023-06-10 NOTE — Progress Notes (Signed)
ANTICOAGULATION CONSULT NOTE - Initial Consult  Pharmacy Consult for IV heparin Indication: atrial fibrillation  Allergies  Allergen Reactions   Other Other (See Comments)   Sulfa Antibiotics Other (See Comments)   Sulfa Drugs Cross Reactors Other (See Comments)    Doesn't remember     Patient Measurements: Height: 5\' 4"  (162.6 cm) Weight: 115.4 kg (254 lb 6.6 oz) IBW/kg (Calculated) : 54.7 Heparin Dosing Weight: 115.5 kg  Vital Signs: Temp: 98.2 F (36.8 C) (07/08 1949) Temp Source: Oral (07/08 1949) BP: 148/44 (07/08 1949) Pulse Rate: 81 (07/09 0145)  Labs: Recent Labs    06/07/23 1254 06/08/23 0726 06/09/23 0350 06/09/23 1238 06/09/23 2314  HGB 12.4 12.6  --  10.7*  --   HCT 41.0 40.2  --  33.8*  --   PLT 146* 138*  --  120*  --   APTT  --   --   --   --  >200*  CREATININE 7.76* 8.41* 9.89*  --   --      Estimated Creatinine Clearance: 5.8 mL/min (A) (by C-G formula based on SCr of 9.89 mg/dL (H)).   Medical History: Past Medical History:  Diagnosis Date   Arthritis    "left knee" (12/04/2016)   CAD (coronary artery disease)    a. 12/2016 NSTEMI/PCI: LM nl, LaD 20p/d, D1 20, OM2 20, OM3 20, RCA 62m (3.0x18 Resolute DES). EF 65%.   Chronic lower back pain    Diverticulosis    on CT   ESRD on dialysis [facility])    GERD (gastroesophageal reflux disease)    Hiatal hernia    History of echocardiogram    a. 09/2022 Echo: EF 60-65%, mild asymm LVH, mod reduced RV fxn, RVSP . Mildly dil RA. Mild AI. Ao sclerosis. Mild MR.   Hyperlipidemia    Hypertension    a. 12/2016 labetalol d/c'd 2/2 hypotension in HD.   Morbid obesity (HCC)    Obesity    Persistent atrial fibrillation (HCC)    a. 05/2023 Zio: 100% afib avg HR 52 (32-99).   Pneumonia    "couple times" (12/04/2016)   Type II diabetes mellitus (HCC)     Medications:  Eliquis prior to admission (last dose 7/7 @2100 )  Assessment: 77 year old female admitted with circulatory shock, of which has  improved. Plan is for PPM, and thus holding Eliquis prior to procedure.   Goal of Therapy:  aPTT 66-102 seconds Monitor platelets by anticoagulation protocol: Yes  07/08 2314 aPTT > 200   Plan:  Contacted RN, heparin already stopped at ~ 0130 prior to PPM procedure schedule at [facility] later today. Pharmacy to F/U anticoag plan post procedure.  Otelia Sergeant, PharmD, [facility] 06/10/2023 2:55 AM

## 2023-06-10 NOTE — Progress Notes (Signed)
Progress Note  Patient Name: Jacqueline [name] Date of Encounter: 06/10/2023  Primary Cardiologist: Kirke Corin  Subjective   No chest pain, dyspnea, dizziness, presyncope, or syncope. No further bradycardic episodes. BP remains intermittently hypotensive despite remaining of Levophed and midodrine. EP following.   Inpatient Medications    Scheduled Meds:  Chlorhexidine Gluconate Cloth  6 each Topical Daily   cinacalcet  30 mg Oral Q supper   ezetimibe  10 mg Oral Daily   feeding supplement (NEPRO CARB STEADY)  237 mL Oral TID BM   ferric citrate  420 mg Oral TID WC   fludrocortisone  0.1 mg Oral Daily   insulin aspart  0-6 Units Subcutaneous Q4H   loratadine  10 mg Oral Daily   midodrine  10 mg Oral TID WC   multivitamin  1 tablet Oral QHS   pregabalin  100 mg Oral QHS   pregabalin  50 mg Oral BID   rosuvastatin  10 mg Oral Daily   senna-docusate  1 tablet Oral BID   Continuous Infusions:  sodium chloride 10 mL/hr at 06/10/23 0600   anticoagulant sodium citrate     heparin 1,800 Units/hr (06/10/23 1016)   norepinephrine (LEVOPHED) Adult infusion 7 mcg/min (06/10/23 0649)   PRN Meds: acetaminophen, alteplase, anticoagulant sodium citrate, bisacodyl, docusate sodium, heparin, lidocaine (PF), lidocaine-prilocaine, ondansetron (ZOFRAN) IV, pentafluoroprop-tetrafluoroeth, polyethylene glycol   Vital Signs    Vitals:   06/10/23 0810 06/10/23 0830 06/10/23 0845 06/10/23 0900  BP:      Pulse: 77     Resp:  17 15 16   Temp:      TempSrc:      SpO2: 95%     Weight:      Height:        Intake/Output Summary (Last 24 hours) at 06/10/2023 1027 Last data filed at 06/10/2023 0600 Gross per 24 hour  Intake 1320.55 ml  Output 0 ml  Net 1320.55 ml   Filed Weights   06/09/23 0500 06/09/23 1622 06/10/23 0411  Weight: 115.5 kg 115.4 kg 117.8 kg    Telemetry    Afib with rare PVCs vs aberrancy with ventricular rates in the 70s to 90s bpm - Personally Reviewed  ECG    Non new  tracings - Personally Reviewed  Physical Exam   GEN: No acute distress.   Neck: No JVD. Cardiac: IRIR, no murmurs, rubs, or gallops.  Respiratory: Clear to auscultation bilaterally.  GI: Soft, nontender, non-distended.   MS: No edema; No deformity. Neuro:  Alert and oriented x 3; Nonfocal.  Psych: Normal affect.  Labs    Chemistry Recent Labs  Lab 06/08/23 0726 06/09/23 0350 06/10/23 0445  NA 133* 131* 133*  K 4.1 3.8 3.2*  CL 92* 91* 94*  CO2 25 23 26   GLUCOSE 162* 219* 175*  BUN 62* 72* 39*  CREATININE 8.41* 9.89* 5.89*  CALCIUM 7.7* 7.2* 7.3*  ALBUMIN  --   --  3.0*  GFRNONAA 4* 4* 7*  ANIONGAP 16* 17* 13     Hematology Recent Labs  Lab 06/08/23 0726 06/09/23 1238 06/10/23 0445  WBC 12.4* 11.2* 10.2  RBC 4.07 3.48* 3.36*  HGB 12.6 10.7* 10.3*  HCT 40.2 33.8* 32.8*  MCV 98.8 97.1 97.6  MCH 31.0 30.7 30.7  MCHC 31.3 31.7 31.4  RDW 15.5 15.3 15.3  PLT 138* 120* 114*    Cardiac EnzymesNo results for input(s): "TROPONINI" in the last 168 hours. No results for input(s): "TROPIPOC" in the last 168  hours.   BNPNo results for input(s): "BNP", "PROBNP" in the last 168 hours.   DDimer No results for input(s): "DDIMER" in the last 168 hours.   Radiology     Cardiac Studies   2D echo 06/08/2023: 1. Left ventricular ejection fraction, by estimation, is 60 to 65%. The  left ventricle has normal function. The left ventricle has no regional  wall motion abnormalities. Left ventricular diastolic parameters are  indeterminate.   2. Right ventricular systolic function is moderately reduced. The right  ventricular size is moderately enlarged. There is severely elevated  pulmonary artery systolic pressure. The estimated right ventricular  systolic pressure is 79.0 mmHg.   3. Left atrial size was mildly dilated.   4. The mitral valve is normal in structure. Mild mitral valve  regurgitation. No evidence of mitral stenosis.   5. Tricuspid valve regurgitation is  moderate.   6. The aortic valve is normal in structure. Aortic valve regurgitation is  not visualized. Aortic valve sclerosis is present, with no evidence of  aortic valve stenosis.   7. The inferior vena cava is dilated in size with <50% respiratory  variability, suggesting right atrial pressure of 15 mmHg.   Patient Profile     77 y.o. female with history of CAD status post RCA stenting in 2018, end-stage renal disease on hemodialysis, hypertension, hypotension, hyperlipidemia, bradycardia, persistent atrial fibrillation, type 2 diabetes mellitus, peripheral arterial disease, sleep apnea, and obesity who is being seen today for the evaluation of slow A-fib and hypertension at the request of Dr. Aundria Rud.   Assessment & Plan    1. Hypertension: Mostly during dialysis.  Persists intermittently despite remaining on Levophed and midodrine. Episodes do not exclusively correlate with bradycardia. She has been evaluated by EP this morning with recommendation to not proceed with Micra implantation. The patient has evidence of severe pulmonary hypertension and RV dysfunction which likely make it extremely difficult to dialyze this patient with effective fluid removal. Not entirely sure if she is a candidate for a different form of renal replacement therapy. Will plan for [facility] on 7/10 to evaluate for ischemia contributing to her hypotensive events as well as to better understand her hemodynamics to assist with volume status and management moving forward. NPO at midnight.    2.  Bradycardia: No further bradycardic events noted. No plans for Micra implantation per EP. Avoid AV nodal blocking medications.    3.  Severe pulmonary hypertension: Unfortunately, given that she is on dialysis, she is likely not a candidate for pulmonary hypertension treatments. Plan for [facility] as above on 7/10.    4.  Moderate-severe tricuspid regurgitation: Not a good candidate for valve repair or clip given degree of pulmonary  hypertension and RV dysfunction.   Informed Consent   Shared Decision Making/Informed Consent{  The risks [stroke (1 in 1000), death (1 in 1000), kidney failure [usually temporary] (1 in 500), bleeding (1 in 200), allergic reaction [possibly serious] (1 in 200)], benefits (diagnostic support and management of coronary artery disease) and alternatives of a cardiac catheterization were discussed in detail with [name] and she is willing to proceed.      For questions or updates, please contact [facility] Please consult [email or web address] for contact info under Cardiology/STEMI.    Signed, Eula Listen, PA-C [facility] Pager: [phone] 06/10/2023, 10:27 AM

## 2023-06-11 ENCOUNTER — Encounter
Admission: EM | Disposition: A | Discharge status: Other Acute Inpatient Hospital | Payer: Self-pay | Source: Ambulatory Visit | Attending: Internal Medicine

## 2023-06-11 DIAGNOSIS — R001 Bradycardia, unspecified: Secondary | ICD-10-CM

## 2023-06-11 DIAGNOSIS — N186 End stage renal disease: Secondary | ICD-10-CM | POA: Diagnosis not present

## 2023-06-11 DIAGNOSIS — I272 Pulmonary hypertension, unspecified: Secondary | ICD-10-CM | POA: Diagnosis not present

## 2023-06-11 DIAGNOSIS — I2510 Atherosclerotic heart disease of native coronary artery without angina pectoris: Secondary | ICD-10-CM | POA: Diagnosis not present

## 2023-06-11 DIAGNOSIS — I959 Hypotension, unspecified: Secondary | ICD-10-CM | POA: Diagnosis not present

## 2023-06-11 HISTORY — PX: RIGHT/LEFT HEART CATH AND CORONARY ANGIOGRAPHY: CATH118266

## 2023-06-11 LAB — RENAL FUNCTION PANEL
Albumin: 2.9 g/dL — ABNORMAL LOW (ref 3.5–5.0)
Anion gap: 11 (ref 5–15)
BUN: 53 mg/dL — ABNORMAL HIGH (ref 8–23)
CO2: 24 mmol/L (ref 22–32)
Calcium: 7 mg/dL — ABNORMAL LOW (ref 8.9–10.3)
Chloride: 95 mmol/L — ABNORMAL LOW (ref 98–111)
Creatinine, Ser: 7.69 mg/dL — ABNORMAL HIGH (ref 0.44–1.00)
GFR, Estimated: 5 mL/min — ABNORMAL LOW (ref >60)
Glucose, Bld: 184 mg/dL — ABNORMAL HIGH (ref 70–99)
Phosphorus: 4.3 mg/dL (ref 2.5–4.6)
Potassium: 3.7 mmol/L (ref 3.5–5.1)
Sodium: 130 mmol/L — ABNORMAL LOW (ref 135–145)

## 2023-06-11 LAB — POCT I-STAT 7, (LYTES, BLD GAS, ICA,H+H)
Acid-base deficit: 2 mmol/L (ref 0.0–2.0)
Bicarbonate: 24.7 mmol/L (ref 20.0–28.0)
Calcium, Ion: 0.92 mmol/L — ABNORMAL LOW (ref 1.15–1.40)
HCT: 32 % — ABNORMAL LOW (ref 36.0–46.0)
Hemoglobin: 10.9 g/dL — ABNORMAL LOW (ref 12.0–15.0)
O2 Saturation: 96 %
Potassium: 3.9 mmol/L (ref 3.5–5.1)
Sodium: 134 mmol/L — ABNORMAL LOW (ref 135–145)
TCO2: 26 mmol/L (ref 22–32)
pCO2 arterial: 48.7 mmHg — ABNORMAL HIGH (ref 32–48)
pH, Arterial: 7.313 — ABNORMAL LOW (ref 7.35–7.45)
pO2, Arterial: 90 mmHg (ref 83–108)

## 2023-06-11 LAB — POCT I-STAT EG7
Acid-Base Excess: 0 mmol/L (ref 0.0–2.0)
Bicarbonate: 27.1 mmol/L (ref 20.0–28.0)
Calcium, Ion: 0.95 mmol/L — ABNORMAL LOW (ref 1.15–1.40)
HCT: 33 % — ABNORMAL LOW (ref 36.0–46.0)
Hemoglobin: 11.2 g/dL — ABNORMAL LOW (ref 12.0–15.0)
O2 Saturation: 60 %
Potassium: 4.1 mmol/L (ref 3.5–5.1)
Sodium: 133 mmol/L — ABNORMAL LOW (ref 135–145)
TCO2: 29 mmol/L (ref 22–32)
pCO2, Ven: 57.2 mmHg (ref 44–60)
pH, Ven: 7.285 (ref 7.25–7.43)
pO2, Ven: 36 mmHg (ref 32–45)

## 2023-06-11 LAB — CBC
HCT: 33.8 % — ABNORMAL LOW (ref 36.0–46.0)
HCT: 32.6 % — ABNORMAL LOW (ref 36.0–46.0)
Hemoglobin: 10.5 g/dL — ABNORMAL LOW (ref 12.0–15.0)
Hemoglobin: 10.2 g/dL — ABNORMAL LOW (ref 12.0–15.0)
MCH: 31 pg (ref 26.0–34.0)
MCH: 30.9 pg (ref 26.0–34.0)
MCHC: 31.1 g/dL (ref 30.0–36.0)
MCHC: 31.3 g/dL (ref 30.0–36.0)
MCV: 99.7 fL (ref 80.0–100.0)
MCV: 98.8 fL (ref 80.0–100.0)
Platelets: 114 10*3/uL — ABNORMAL LOW (ref 150–400)
Platelets: 110 10*3/uL — ABNORMAL LOW (ref 150–400)
RBC: 3.39 MIL/uL — ABNORMAL LOW (ref 3.87–5.11)
RBC: 3.3 MIL/uL — ABNORMAL LOW (ref 3.87–5.11)
RDW: 15.3 % (ref 11.5–15.5)
RDW: 15.1 % (ref 11.5–15.5)
WBC: 9.7 10*3/uL (ref 4.0–10.5)
WBC: 7.8 10*3/uL (ref 4.0–10.5)
nRBC: 0 % (ref 0.0–0.2)
nRBC: 0 % (ref 0.0–0.2)

## 2023-06-11 LAB — GLUCOSE, CAPILLARY
Glucose-Capillary: 153 mg/dL — ABNORMAL HIGH (ref 70–99)
Glucose-Capillary: 134 mg/dL — ABNORMAL HIGH (ref 70–99)
Glucose-Capillary: 93 mg/dL (ref 70–99)
Glucose-Capillary: 87 mg/dL (ref 70–99)

## 2023-06-11 LAB — PHOSPHORUS: Phosphorus: 3.7 mg/dL (ref 2.5–4.6)

## 2023-06-11 LAB — BASIC METABOLIC PANEL
Anion gap: 12 (ref 5–15)
BUN: 46 mg/dL — ABNORMAL HIGH (ref 8–23)
CO2: 24 mmol/L (ref 22–32)
Calcium: 7.1 mg/dL — ABNORMAL LOW (ref 8.9–10.3)
Chloride: 95 mmol/L — ABNORMAL LOW (ref 98–111)
Creatinine, Ser: 6.22 mg/dL — ABNORMAL HIGH (ref 0.44–1.00)
GFR, Estimated: 6 mL/min — ABNORMAL LOW (ref >60)
Glucose, Bld: 118 mg/dL — ABNORMAL HIGH (ref 70–99)
Potassium: 3.8 mmol/L (ref 3.5–5.1)
Sodium: 131 mmol/L — ABNORMAL LOW (ref 135–145)

## 2023-06-11 LAB — MAGNESIUM: Magnesium: 1.5 mg/dL — ABNORMAL LOW (ref 1.7–2.4)

## 2023-06-11 LAB — HEPARIN LEVEL (UNFRACTIONATED): Heparin Unfractionated: >1.1 IU/mL — ABNORMAL HIGH (ref 0.30–0.70)

## 2023-06-11 LAB — TROPONIN I (HIGH SENSITIVITY)
Troponin I (High Sensitivity): 39 ng/L — ABNORMAL HIGH (ref <18)
Troponin I (High Sensitivity): 37 ng/L — ABNORMAL HIGH (ref <18)

## 2023-06-11 LAB — APTT: aPTT: 167 seconds (ref 24–36)

## 2023-06-11 SURGERY — RIGHT/LEFT HEART CATH AND CORONARY ANGIOGRAPHY
Anesthesia: Moderate Sedation

## 2023-06-11 MED ORDER — MORPHINE SULFATE (PF) 2 MG/ML IV SOLN
1.0000 mg | INTRAVENOUS | Status: DC | PRN
  Administered 2023-06-11 – 2023-06-13 (×6): 1 mg via INTRAVENOUS
  Filled 2023-06-11 (×7): qty 1

## 2023-06-11 MED ORDER — HEPARIN (PORCINE) 25000 UT/250ML-% IV SOLN
1350.0000 [IU]/h | INTRAVENOUS | Status: DC
  Administered 2023-06-11: 1100 [IU]/h via INTRAVENOUS
  Administered 2023-06-12: 1300 [IU]/h via INTRAVENOUS
  Administered 2023-06-13: 1500 [IU]/h via INTRAVENOUS
  Administered 2023-06-14: 1400 [IU]/h via INTRAVENOUS
  Administered 2023-06-15 – 2023-06-17 (×4): 1250 [IU]/h via INTRAVENOUS
  Administered 2023-06-19 – 2023-06-20 (×2): 1350 [IU]/h via INTRAVENOUS
  Filled 2023-06-11 (×10): qty 250

## 2023-06-11 MED ORDER — HEPARIN (PORCINE) 25000 UT/250ML-% IV SOLN: 1100.0000 [IU]/h | INTRAVENOUS | Status: DC

## 2023-06-11 MED ORDER — DOCUSATE SODIUM 100 MG PO CAPS: 100.0000 mg | ORAL_CAPSULE | Freq: Two times a day (BID) | ORAL | Status: DC | PRN

## 2023-06-11 MED ORDER — IOHEXOL 300 MG/ML  SOLN
INTRAMUSCULAR | Status: DC | PRN
  Administered 2023-06-11: 75 mL

## 2023-06-11 MED ORDER — FENTANYL CITRATE (PF) 100 MCG/2ML IJ SOLN
INTRAMUSCULAR | Status: AC
  Filled 2023-06-11: qty 2

## 2023-06-11 MED ORDER — MIDAZOLAM HCL 2 MG/2ML IJ SOLN
INTRAMUSCULAR | Status: AC
  Filled 2023-06-11: qty 2

## 2023-06-11 MED ORDER — MIDODRINE HCL 5 MG PO TABS
10.0000 mg | ORAL_TABLET | ORAL | Status: DC | PRN
  Administered 2023-06-11: 10 mg via ORAL
  Filled 2023-06-11: qty 2

## 2023-06-11 MED ORDER — SODIUM CHLORIDE 0.9% FLUSH
3.0000 mL | Freq: Two times a day (BID) | INTRAVENOUS | Status: DC
  Administered 2023-06-11 – 2023-06-30 (×39): 3 mL via INTRAVENOUS

## 2023-06-11 MED ORDER — MAGNESIUM SULFATE 2 GM/50ML IV SOLN
2.0000 g | Freq: Once | INTRAVENOUS | Status: AC
  Administered 2023-06-11: 2 g via INTRAVENOUS
  Filled 2023-06-11: qty 50

## 2023-06-11 MED ORDER — HYDRALAZINE HCL 20 MG/ML IJ SOLN: 10.0000 mg | INTRAMUSCULAR | Status: AC | PRN

## 2023-06-11 MED ORDER — HEPARIN SODIUM (PORCINE) 5000 UNIT/ML IJ SOLN: 5000.0000 [IU] | Freq: Three times a day (TID) | INTRAMUSCULAR | Status: DC

## 2023-06-11 MED ORDER — HEPARIN (PORCINE) IN NACL 1000-0.9 UT/500ML-% IV SOLN
INTRAVENOUS | Status: AC
  Filled 2023-06-11: qty 1000

## 2023-06-11 MED ORDER — INSULIN ASPART 100 UNIT/ML IJ SOLN: 0.0000 [IU] | INTRAMUSCULAR | Status: DC

## 2023-06-11 MED ORDER — PENTAFLUOROPROP-TETRAFLUOROETH EX AERO
INHALATION_SPRAY | CUTANEOUS | Status: AC
  Filled 2023-06-11: qty 30

## 2023-06-11 MED ORDER — LIDOCAINE HCL (PF) 1 % IJ SOLN
INTRAMUSCULAR | Status: DC | PRN
  Administered 2023-06-11: 2 mL

## 2023-06-11 MED ORDER — SODIUM CHLORIDE 0.9 % IV SOLN: 250.0000 mL | INTRAVENOUS | Status: DC | PRN

## 2023-06-11 MED ORDER — HYDROCODONE-ACETAMINOPHEN 5-325 MG PO TABS
1.0000 | ORAL_TABLET | Freq: Four times a day (QID) | ORAL | Status: DC | PRN
  Administered 2023-06-12 – 2023-06-13 (×2): 2 via ORAL
  Administered 2023-06-24: 1 via ORAL
  Filled 2023-06-11: qty 1
  Filled 2023-06-11 (×2): qty 2

## 2023-06-11 MED ORDER — SODIUM CHLORIDE 0.9% FLUSH: 3.0000 mL | INTRAVENOUS | Status: DC | PRN

## 2023-06-11 MED ORDER — POLYETHYLENE GLYCOL 3350 17 G PO PACK: 17.0000 g | PACK | Freq: Every day | ORAL | Status: DC | PRN

## 2023-06-11 MED ORDER — MORPHINE SULFATE (PF) 2 MG/ML IV SOLN
1.0000 mg | Freq: Once | INTRAVENOUS | Status: AC
  Administered 2023-06-11: 1 mg via INTRAVENOUS
  Filled 2023-06-11: qty 1

## 2023-06-11 MED ORDER — HEPARIN (PORCINE) IN NACL 2000-0.9 UNIT/L-% IV SOLN
INTRAVENOUS | Status: DC | PRN
  Administered 2023-06-11: 1000 mL

## 2023-06-11 SURGICAL SUPPLY — 13 items
CANNULA 5F STIFF (CANNULA) IMPLANT
CATH INFINITI 5FR MULTPACK ANG (CATHETERS) IMPLANT
CATH SWAN GANZ 7F STRAIGHT (CATHETERS) IMPLANT
DEVICE CLOSURE MYNXGRIP 5F (Vascular Products) IMPLANT
DRAPE BRACHIAL (DRAPES) IMPLANT
PACK CARDIAC CATH (CUSTOM PROCEDURE TRAY) ×2 IMPLANT
PANNUS RETENTION SYSTEM 2 PAD (MISCELLANEOUS) IMPLANT
PROTECTION STATION PRESSURIZED (MISCELLANEOUS) ×1
SET ATX-X65L (MISCELLANEOUS) IMPLANT
SHEATH AVANTI 5FR X 11CM (SHEATH) IMPLANT
SHEATH AVANTI 7FRX11 (SHEATH) IMPLANT
STATION PROTECTION PRESSURIZED (MISCELLANEOUS) IMPLANT
WIRE GUIDERIGHT .035X150 (WIRE) IMPLANT

## 2023-06-11 NOTE — Discharge Planning (Signed)
ESTABLISHED HEMODIALYSIS [facility]  [address] [place], [place]. [address] [phone]   Schedule: TTS 11:00am

## 2023-06-11 NOTE — H&P (View-Only) (Signed)
Rounding Note    Patient Name: Jacqueline [name] Date of Encounter: 06/11/2023  [facility] Cardiologist: Lorine Bears, MD   Subjective   Heart rate stable in the 70 range Remains on nor epi infusion Blood pressure 95-1 02 systolic Family at the bedside Indicates she is willing to undergo catheterization today with Dr. Okey Dupre Discussed echocardiogram findings of pulmonary hypertension Plans for Micra device on hold EP notes reviewed, rates into the 80s yesterday while supine with associated hypotension perhaps indicating rate less of a contributor to hypotension  Inpatient Medications    Scheduled Meds:  Chlorhexidine Gluconate Cloth  6 each Topical Daily   cinacalcet  30 mg Oral Q supper   ezetimibe  10 mg Oral Daily   feeding supplement (NEPRO CARB STEADY)  237 mL Oral TID BM   ferric citrate  420 mg Oral TID WC   fludrocortisone  0.1 mg Oral Daily   insulin aspart  0-6 Units Subcutaneous Q4H   loratadine  10 mg Oral Daily   midodrine  10 mg Oral TID WC   multivitamin  1 tablet Oral QHS   pregabalin  100 mg Oral QHS   pregabalin  50 mg Oral BID   rosuvastatin  10 mg Oral Daily   senna-docusate  1 tablet Oral BID   sodium chloride flush  3 mL Intravenous Q12H   Continuous Infusions:  sodium chloride Stopped (06/10/23 1900)   sodium chloride     sodium chloride Stopped (06/11/23 0815)   anticoagulant sodium citrate     heparin 1,100 Units/hr (06/11/23 1056)   norepinephrine (LEVOPHED) Adult infusion 1 mcg/min (06/11/23 1056)   PRN Meds: sodium chloride, acetaminophen, alteplase, anticoagulant sodium citrate, bisacodyl, docusate sodium, heparin, lidocaine (PF), lidocaine-prilocaine, midodrine, ondansetron (ZOFRAN) IV, pentafluoroprop-tetrafluoroeth, polyethylene glycol, sodium chloride flush   Vital Signs    Vitals:   06/11/23 1029 06/11/23 1038 06/11/23 1042 06/11/23 1100  BP:  (!) 95/44 (!) 102/39 (!) 102/43  Pulse: (!) 59 (!) 59 60   Resp: 16 18  18 17   Temp: 98.1 F (36.7 C)     TempSrc: Oral     SpO2: 97% 100% 96%   Weight:      Height:        Intake/Output Summary (Last 24 hours) at 06/11/2023 1120 Last data filed at 06/11/2023 1056 Gross per 24 hour  Intake 1324.71 ml  Output --  Net 1324.71 ml      06/11/2023    4:45 AM 06/10/2023    4:11 AM 06/09/2023    4:22 PM  Last 3 Weights  Weight (lbs) 259 lb 7.7 oz 259 lb 11.2 oz 254 lb 6.6 oz  Weight (kg) 117.7 kg 117.8 kg 115.4 kg      Telemetry    Normal sinus rhythm rate 60-70- Personally Reviewed  ECG    - Personally Reviewed  Physical Exam   GEN: No acute distress.   Neck: No JVD Cardiac: RRR, no murmurs, rubs, or gallops.  Respiratory: Clear to auscultation bilaterally. GI: Soft, nontender, non-distended  MS: No edema; No deformity. Neuro:  Nonfocal  Psych: Normal affect   Labs    High Sensitivity Troponin:  No results for input(s): "TROPONINIHS" in the last 720 hours.   Chemistry Recent Labs  Lab 06/08/23 0726 06/09/23 0350 06/10/23 0445 06/11/23 0454  NA 133* 131* 133* 130*  K 4.1 3.8 3.2* 3.7  CL 92* 91* 94* 95*  CO2 25 23 26 24   GLUCOSE 162* 219* 175*  184*  BUN 62* 72* 39* 53*  CREATININE 8.41* 9.89* 5.89* 7.69*  CALCIUM 7.7* 7.2* 7.3* 7.0*  MG 2.0 1.8  --   --   ALBUMIN  --   --  3.0* 2.9*  GFRNONAA 4* 4* 7* 5*  ANIONGAP 16* 17* 13 11    Lipids No results for input(s): "CHOL", "TRIG", "HDL", "LABVLDL", "LDLCALC", "CHOLHDL" in the last 168 hours.  Hematology Recent Labs  Lab 06/09/23 1238 06/10/23 0445 06/11/23 0454  WBC 11.2* 10.2 9.7  RBC 3.48* 3.36* 3.39*  HGB 10.7* 10.3* 10.5*  HCT 33.8* 32.8* 33.8*  MCV 97.1 97.6 99.7  MCH 30.7 30.7 31.0  MCHC 31.7 31.4 31.1  RDW 15.3 15.3 15.3  PLT 120* 114* 114*   Thyroid  Recent Labs  Lab 06/07/23 1254  TSH 1.497  1.491  FREET4 0.95    BNPNo results for input(s): "BNP", "PROBNP" in the last 168 hours.  DDimer No results for input(s): "DDIMER" in the last 168 hours.    Radiology    No results found.  Cardiac Studies   Echo   1. Left ventricular ejection fraction, by estimation, is 60 to 65%. The  left ventricle has normal function. The left ventricle has no regional  wall motion abnormalities. Left ventricular diastolic parameters are  indeterminate.   2. Right ventricular systolic function is moderately reduced. The right  ventricular size is moderately enlarged. There is severely elevated  pulmonary artery systolic pressure. The estimated right ventricular  systolic pressure is 79.0 mmHg.   3. Left atrial size was mildly dilated.   4. The mitral valve is normal in structure. Mild mitral valve  regurgitation. No evidence of mitral stenosis.   5. Tricuspid valve regurgitation is moderate.   6. The aortic valve is normal in structure. Aortic valve regurgitation is  not visualized. Aortic valve sclerosis is present, with no evidence of  aortic valve stenosis.   7. The inferior vena cava is dilated in size with <50% respiratory  variability, suggesting right atrial pressure of 15 mmHg.   Patient Profile     Jacqueline [name] is a 77 y.o. female with a history of CAD status post RCA stenting, end-stage renal disease on hemodialysis, hypertension, hypotension, hyperlipidemia, bradycardia, persistent atrial fibrillation, type 2 diabetes mellitus, peripheral arterial disease, sleep apnea, and obesity who is being seen today for the evaluation of bradycardia/slow A-fib and hypotension   Assessment & Plan     A/P: Hypotension/bradycardia Chronic finding, despite treatment with midodrine  daily, extra midodrine on dialysis days Persistent hypotension, may be unrelated to her bradycardia given heart rate yesterday in the 80s while supine with associated hypotension Initial plans for Micra pacemaker device on hold Schedule for right and left heart catheterization today Heart rate and blood pressure augmented by nor epi infusion Options for treatment  may be limited in the setting of pulmonary hypertension Hypotension limiting regular dialysis   Permanent atrial fibrillation with slow ventricular rate Noted previously on Zio monitor with rates in the 40s for long periods of time Hypotension and bradycardia limiting her hemodialysis -Improved numbers remain infusion, changed to nor epi given side effects EP following, no immediate plans for Micra device   Coronary artery disease Prior non-STEMI 2018 PCI to the RCA On statin, Zetia,  Eliquis on hold -Plan for right and left heart catheterization today   Hyperlipidemia Continue statin with Zetia   End-stage renal disease On midodrine for blood pressure support HD has been limited secondary to hypotension  Type 2 diabetes On insulin   Discussion today concerning risk and benefit of catheterization, family at the bedside, she is indicated she would like to proceed, discussed potential findings  Total encounter time more than 35 minutes  Greater than 50% was spent in counseling and coordination of care with the patient   For questions or updates, please contact [facility] Please consult [email or web address] for contact info under        Signed, Julien Nordmann, MD  06/11/2023, 11:20 AM

## 2023-06-11 NOTE — Progress Notes (Signed)
Hemodialysis note  Received patient in bed. Alert and oriented.  Informed consent signed and in chart.  Treatment initiated: 1705 Treatment completed: 2056  Patient tolerated well. Alert without acute distress.  Report given to patient's RN.   Access used: LUA AVF Access issues: none  Total UF removed: 1.2 L Medication(s) given:  none  Post HD weight: 113 kg   Wolfgang Phoenix [facility]

## 2023-06-11 NOTE — Progress Notes (Signed)
ANTICOAGULATION CONSULT NOTE  Pharmacy Consult for IV heparin Indication: atrial fibrillation  Allergies  Allergen Reactions   Sulfa Antibiotics Other (See Comments)   Sulfa Drugs Cross Reactors Other (See Comments)    Doesn't remember     Patient Measurements: Height: 5\' 4"  (162.6 cm) Weight: 117.7 kg (259 lb 7.7 oz) IBW/kg (Calculated) : 54.7 Heparin Dosing Weight: 115.5 kg  Vital Signs: Temp: 98.1 F (36.7 C) (07/10 1029) Temp Source: Oral (07/10 1029) BP: 102/52 (07/10 1347) Pulse Rate: 60 (07/10 1347)  Labs: Recent Labs    06/09/23 0350 06/09/23 1238 06/09/23 1238 06/09/23 2314 06/10/23 0445 06/10/23 1740 06/11/23 0454 06/11/23 1236 06/11/23 1237  HGB  --  10.7*   < >  --  10.3*  --  10.5* 11.2* 10.9*  HCT  --  33.8*   < >  --  32.8*  --  33.8* 33.0* 32.0*  PLT  --  120*  --   --  114*  --  114*  --   --   APTT  --   --   --  >200*  --  >200* 167*  --   --   HEPARINUNFRC  --   --   --   --  >1.10*  --  >1.10*  --   --   CREATININE 9.89*  --   --   --  5.89*  --  7.69*  --   --    < > = values in this interval not displayed.     Estimated Creatinine Clearance: 7.5 mL/min (A) (by C-G formula based on SCr of 7.69 mg/dL (H)).   Medical History: Past Medical History:  Diagnosis Date   Arthritis    "left knee" (12/04/2016)   CAD (coronary artery disease)    a. 12/2016 NSTEMI/PCI: LM nl, LaD 20p/d, D1 20, OM2 20, OM3 20, RCA 80m (3.0x18 Resolute DES). EF 65%.   Chronic lower back pain    Diverticulosis    on CT   ESRD on dialysis [facility])    GERD (gastroesophageal reflux disease)    Hiatal hernia    History of echocardiogram    a. 09/2022 Echo: EF 60-65%, mild asymm LVH, mod reduced RV fxn, RVSP . Mildly dil RA. Mild AI. Ao sclerosis. Mild MR.   Hyperlipidemia    Hypertension    a. 12/2016 labetalol d/c'd 2/2 hypotension in HD.   Morbid obesity (HCC)    Obesity    Persistent atrial fibrillation (HCC)    a. 05/2023 Zio: 100% afib avg HR 52 (32-99).    Pneumonia    "couple times" (12/04/2016)   Type II diabetes mellitus (HCC)     Medications:  Eliquis prior to admission (last dose 7/7 @2100 )  Assessment: 77 year old female admitted with circulatory shock, of which has improved. Plan is for PPM, and thus holding Eliquis prior to procedure.   Goal of Therapy:  aPTT 66-102 seconds Monitor platelets by anticoagulation protocol: Yes  07/08 2314 aPTT > 200, Supratherapeutic @ 1800 units/hr 07/09 1750 aPTT > 200, supratherapeutic 1800 > 1400 units/hr   Plan: s/p LHC. Per cardiology, restart heparin at 2130 (which is 8 hours post-sheath removal) Restart heparin 1100 units/hr on 7/10 @2130  aPTT 8 hours after restart CBC daily   Elliot Gurney, PharmD, BCPS Clinical Pharmacist  06/11/2023 3:13 PM

## 2023-06-11 NOTE — Interval H&P Note (Signed)
History and Physical Interval Note:  06/11/2023 11:57 AM  Jacqueline [name]  has presented today for surgery, with the diagnosis of coronary artery disease with severe pulmonary hypertension and systemic hypotension.  The various methods of treatment have been discussed with the patient and family. After consideration of risks, benefits and other options for treatment, the patient has consented to  Procedure(s): RIGHT/LEFT HEART CATH AND CORONARY ANGIOGRAPHY (N/A) as a surgical intervention.  The patient's history has been reviewed, patient examined, no change in status, stable for surgery.  I have reviewed the patient's chart and labs.  Questions were answered to the patient's satisfaction.    Cath Lab Visit (complete for each Cath Lab visit)  Clinical Evaluation Leading to the Procedure:   ACS: No.  Non-ACS:    Anginal Classification: No Symptoms  Anti-ischemic medical therapy: No Therapy  Non-Invasive Test Results: No non-invasive testing performed  Prior CABG: No previous CABG  Jacqueline [name]

## 2023-06-11 NOTE — TOC Progression Note (Signed)
Transition of Care [facility]) - Progression Note    Patient Details  Name: Jacqueline [name] MRN: [id number] Date of Birth: 1946/09/09  Transition of Care [facility]) CM/SW Contact  Kreg Shropshire, RN Phone Number: 06/11/2023, 10:44 AM  Clinical Narrative:    Gustavus Bryant at [facility] regarding pt getting cath today. Informed that case management will update whenever pt is ready for d/c.   Expected Discharge Plan: Long Term Nursing Home Barriers to Discharge: Continued Medical Work up  Expected Discharge Plan and Services       Living arrangements for the past 2 months: Skilled Nursing Facility Expected Discharge Date: 06/10/23                                     Social Determinants of Health (SDOH) Interventions SDOH Screenings   Food Insecurity: No Food Insecurity (06/07/2023)  Housing: Low Risk  (06/07/2023)  Transportation Needs: No Transportation Needs (06/07/2023)  Utilities: Not At Risk (06/07/2023)  Depression (PHQ2-9): Low Risk  (06/27/2021)  Financial Resource Strain: Medium Risk (06/27/2021)  Physical Activity: Inactive (06/27/2021)  Stress: No Stress Concern Present (06/27/2021)  Tobacco Use: Medium Risk (06/08/2023)    Readmission Risk Interventions    05/22/2022   12:26 PM  Readmission Risk Prevention Plan  Transportation Screening Complete  PCP or Specialist Appt within 3-5 Days Complete  HRI or Home Care Consult Complete  Social Work Consult for Recovery Care Planning/Counseling Complete  Palliative Care Screening Complete  Medication Review Oceanographer) Complete

## 2023-06-11 NOTE — Plan of Care (Signed)

## 2023-06-11 NOTE — Progress Notes (Signed)
Rounding Note    Patient Name: Jacqueline [name] Date of Encounter: 06/11/2023  [facility] Cardiologist: Lorine Bears, MD   Subjective   Patient seen on AM rounds. Denies any chest pain or shortness of breath. Scheduled for [facility] today. Remains on heparin infusion and norepinephrine. Heart rates and blood pressure improved with pressor infusion. No further episodes of bradycardia noted.   Inpatient Medications    Scheduled Meds:  Chlorhexidine Gluconate Cloth  6 each Topical Daily   cinacalcet  30 mg Oral Q supper   ezetimibe  10 mg Oral Daily   feeding supplement (NEPRO CARB STEADY)  237 mL Oral TID BM   ferric citrate  420 mg Oral TID WC   fludrocortisone  0.1 mg Oral Daily   insulin aspart  0-6 Units Subcutaneous Q4H   loratadine  10 mg Oral Daily   midodrine  10 mg Oral TID WC   multivitamin  1 tablet Oral QHS   pregabalin  100 mg Oral QHS   pregabalin  50 mg Oral BID   rosuvastatin  10 mg Oral Daily   senna-docusate  1 tablet Oral BID   sodium chloride flush  3 mL Intravenous Q12H   Continuous Infusions:  sodium chloride Stopped (06/10/23 1900)   sodium chloride     sodium chloride 10 mL/hr at 06/11/23 0747   anticoagulant sodium citrate     heparin 1,100 Units/hr (06/11/23 0736)   norepinephrine (LEVOPHED) Adult infusion 3 mcg/min (06/11/23 0747)   PRN Meds: sodium chloride, acetaminophen, alteplase, anticoagulant sodium citrate, bisacodyl, docusate sodium, heparin, lidocaine (PF), lidocaine-prilocaine, ondansetron (ZOFRAN) IV, pentafluoroprop-tetrafluoroeth, polyethylene glycol, sodium chloride flush   Vital Signs    Vitals:   06/11/23 0745 06/11/23 0800 06/11/23 0815 06/11/23 0830  BP:      Pulse:      Resp: 17 17 16 16   Temp:  97.7 F (36.5 C)    TempSrc:  Oral    SpO2:      Weight:      Height:        Intake/Output Summary (Last 24 hours) at 06/11/2023 0901 Last data filed at 06/11/2023 0747 Gross per 24 hour  Intake 1367.54 ml   Output --  Net 1367.54 ml      06/11/2023    4:45 AM 06/10/2023    4:11 AM 06/09/2023    4:22 PM  Last 3 Weights  Weight (lbs) 259 lb 7.7 oz 259 lb 11.2 oz 254 lb 6.6 oz  Weight (kg) 117.7 kg 117.8 kg 115.4 kg      Telemetry    Atrial fibrillation with rates of 68 with frequency of BNC versus PVCs- Personally Reviewed  ECG    No new tracings- Personally Reviewed  Physical Exam   GEN: No acute distress.   Neck: Unable to determine JVD due to body habitus Cardiac: IR IR, no murmurs, rubs, or gallops.  Respiratory: Clear to auscultation bilaterally. GI: Soft, nontender, obese, non-distended  MS: No edema; No deformity. Neuro:  Nonfocal  Psych: Normal affect   Labs    High Sensitivity Troponin:  No results for input(s): "TROPONINIHS" in the last 720 hours.   Chemistry Recent Labs  Lab 06/08/23 0726 06/09/23 0350 06/10/23 0445 06/11/23 0454  NA 133* 131* 133* 130*  K 4.1 3.8 3.2* 3.7  CL 92* 91* 94* 95*  CO2 25 23 26 24   GLUCOSE 162* 219* 175* 184*  BUN 62* 72* 39* 53*  CREATININE 8.41* 9.89* 5.89* 7.69*  CALCIUM 7.7* 7.2* 7.3* 7.0*  MG 2.0 1.8  --   --   ALBUMIN  --   --  3.0* 2.9*  GFRNONAA 4* 4* 7* 5*  ANIONGAP 16* 17* 13 11    Lipids No results for input(s): "CHOL", "TRIG", "HDL", "LABVLDL", "LDLCALC", "CHOLHDL" in the last 168 hours.  Hematology Recent Labs  Lab 06/09/23 1238 06/10/23 0445 06/11/23 0454  WBC 11.2* 10.2 9.7  RBC 3.48* 3.36* 3.39*  HGB 10.7* 10.3* 10.5*  HCT 33.8* 32.8* 33.8*  MCV 97.1 97.6 99.7  MCH 30.7 30.7 31.0  MCHC 31.7 31.4 31.1  RDW 15.3 15.3 15.3  PLT 120* 114* 114*   Thyroid  Recent Labs  Lab 06/07/23 1254  TSH 1.497  1.491  FREET4 0.95    BNPNo results for input(s): "BNP", "PROBNP" in the last 168 hours.  DDimer No results for input(s): "DDIMER" in the last 168 hours.   Radiology    No results found.  Cardiac Studies   2D echo 06/08/2023: 1. Left ventricular ejection fraction, by estimation, is 60 to 65%.  The  left ventricle has normal function. The left ventricle has no regional  wall motion abnormalities. Left ventricular diastolic parameters are  indeterminate.   2. Right ventricular systolic function is moderately reduced. The right  ventricular size is moderately enlarged. There is severely elevated  pulmonary artery systolic pressure. The estimated right ventricular  systolic pressure is 79.0 mmHg.   3. Left atrial size was mildly dilated.   4. The mitral valve is normal in structure. Mild mitral valve  regurgitation. No evidence of mitral stenosis.   5. Tricuspid valve regurgitation is moderate.   6. The aortic valve is normal in structure. Aortic valve regurgitation is  not visualized. Aortic valve sclerosis is present, with no evidence of  aortic valve stenosis.   7. The inferior vena cava is dilated in size with <50% respiratory  variability, suggesting right atrial pressure of 15 mmHg.   Patient Profile     77 y.o. female with a history of coronary artery disease status post RCA stenting in 2018, end-stage renal disease on hemodialysis, hypertension, hypotension, hyperlipidemia, bradycardia, persistent atrial fibrillation, type 2 diabetes, peripheral arterial disease, sleep apnea, and obesity is being seen today for the evaluation of slow A-fib and hypotension.  Assessment & Plan    Hypotension -Primarily during dialysis -Currently remains on Levophed -Unfortunately episodes does not exclusively correlate with the bradycardic episodes. -Has been continued on midodrine -Plan for [facility] today with further recommendations to follow  Bradycardia/slow A-fib/persistent atrial fibrillation -No further bradycardic events noted on telemetry -No current plans for Micra implantation per EP -Continue to avoid AV nodal blocking medications -Continue with telemetry monitoring -currently on heparin infusion for CHA2DS2-VASc score of at least 6 -Restart apixaban prior to  discharge  Severe pulmonary hypertension -currently since she is on dialysis she is not likely candidate for pulmonary hypertension treatments -Plans to undergo right heart catheterization later today  ESRD -Management per nephrology  Moderate to severe tricuspid regurgitation -Not a good candidate for valve repair clip given degree of pulmonary hypertension and RV dysfunction     Informed Consent   Shared Decision Making/Informed Consent The risks [stroke (1 in 1000), death (1 in 1000), kidney failure [usually temporary] (1 in 500), bleeding (1 in 200), allergic reaction [possibly serious] (1 in 200)], benefits (diagnostic support and management of coronary artery disease) and alternatives of a cardiac catheterization were discussed in detail with Jacqueline [name] and she  is willing to proceed.     For questions or updates, please contact [facility] Please consult [email or web address] for contact info under        Signed, Makyia Erxleben, NP  06/11/2023, 9:01 AM

## 2023-06-11 NOTE — Progress Notes (Signed)
Patient Name: Jacqueline [name] Date of Encounter: 06/11/2023  Primary Cardiologist: Lorine Bears, MD Electrophysiologist: Maurice Small, MD  Interval Summary   Viona Gilmore. Denies complaints.  Continues on norepi gtt. Bradycardic and hypoTN when norepi weaned yesterday afternoon  Patient's sister at bedside  Inpatient Medications    Scheduled Meds:  Chlorhexidine Gluconate Cloth  6 each Topical Daily   cinacalcet  30 mg Oral Q supper   ezetimibe  10 mg Oral Daily   feeding supplement (NEPRO CARB STEADY)  237 mL Oral TID BM   ferric citrate  420 mg Oral TID WC   fludrocortisone  0.1 mg Oral Daily   insulin aspart  0-6 Units Subcutaneous Q4H   loratadine  10 mg Oral Daily   midodrine  10 mg Oral TID WC   multivitamin  1 tablet Oral QHS   pregabalin  100 mg Oral QHS   pregabalin  50 mg Oral BID   rosuvastatin  10 mg Oral Daily   senna-docusate  1 tablet Oral BID   sodium chloride flush  3 mL Intravenous Q12H   Continuous Infusions:  sodium chloride Stopped (06/10/23 1900)   sodium chloride     sodium chloride 10 mL/hr at 06/11/23 0747   anticoagulant sodium citrate     heparin 1,100 Units/hr (06/11/23 0736)   norepinephrine (LEVOPHED) Adult infusion 3 mcg/min (06/11/23 0747)   PRN Meds: sodium chloride, acetaminophen, alteplase, anticoagulant sodium citrate, bisacodyl, docusate sodium, heparin, lidocaine (PF), lidocaine-prilocaine, ondansetron (ZOFRAN) IV, pentafluoroprop-tetrafluoroeth, polyethylene glycol, sodium chloride flush   Vital Signs    Vitals:   06/11/23 0815 06/11/23 0830 06/11/23 0845 06/11/23 0900  BP:      Pulse:      Resp: 16 16 16 16   Temp:      TempSrc:      SpO2:      Weight:      Height:        Intake/Output Summary (Last 24 hours) at 06/11/2023 0945 Last data filed at 06/11/2023 0747 Gross per 24 hour  Intake 1267.54 ml  Output --  Net 1267.54 ml   Filed Weights   06/09/23 1622 06/10/23 0411 06/11/23 0445  Weight: 115.4 kg 117.8 kg  117.7 kg    Physical Exam    GEN- The patient is well- appearing, alert and oriented x 3 today.   Lungs- Clear to ausculation bilaterally, normal work of breathing Cardiac- Irregularly irregular rate and rhythm, no murmurs, rubs or gallops GI- soft, NT, ND, + BS Extremities- no clubbing or cyanosis. No edema  Telemetry    Afib, rates 60-80s; freq PVC (personally reviewed)  Hospital Course    Jacqueline [name] is a 77 y.o. female  with a history of CAD s/p PCI, ESRD on HD, HTN, HypoTN,  bradycardia, persis AFib, OSA admitted for bradycardia, hypotension. EP asked to consult for consideration of PPM  Assessment & Plan    #) bradycardia  #) Hypotension #) pulmHTN #) CAD No AVN blocking agents Continue norepi gtt  Given hypotension without bradycardia, recommend further eval of CAD and pulmHTN with [facility]  R/LHC planned for today   #)  Persis AFib (?perm) CHA2DS2-VASc Score = 6 (HTN, T2DM, vasc dz, age x 2, gender)  Hold eliquis for procedure Hep gtt per pharmacy  #) ESRD Mgmt per primary team     For questions or updates, please contact [facility] Please consult [email or web address] for contact info under Cardiology/STEMI.  Signed, Sherie Don, NP  06/11/2023, 9:45 AM

## 2023-06-11 NOTE — Progress Notes (Signed)
[facility]  ROUNDING NOTE   Subjective:   Patient due for both right and left heart catheterization today. We will plan for dialysis afterwards. Continues to have relative hypotension and current on Levophed.  Objective:  Vital signs in last 24 hours:  Temp:  [97.6 F (36.4 C)-99.2 F (37.3 C)] 98.1 F (36.7 C) (07/10 1029) Pulse Rate:  [57-86] 57 (07/10 1130) Resp:  [13-27] 16 (07/10 1130) BP: (95-102)/(39-44) 100/43 (07/10 1130) SpO2:  [88 %-100 %] 98 % (07/10 1130) Arterial Line BP: (74-168)/(20-46) 120/39 (07/10 1000) Weight:  [117.7 kg] 117.7 kg (07/10 0445)  Weight change: 2.3 kg Filed Weights   06/09/23 1622 06/10/23 0411 06/11/23 0445  Weight: 115.4 kg 117.8 kg 117.7 kg    Intake/Output: I/O last 3 completed shifts: In: 1875 [P.O.:894; I.V.:981] Out: 0    Intake/Output this shift:  Total I/O In: 111.9 [I.V.:111.9] Out: -   Physical Exam: General: NAD  Head: Normocephalic, atraumatic. Moist oral mucosal membranes  Eyes: Anicteric  Lungs:  Clear to auscultation, normal effort  Heart: Regular rate and rhythm  Abdomen:  Soft, nontender,   Extremities:  Trace peripheral edema.  Neurologic: Alert and oriented, moving all four extremities  Skin: No lesions  Access: Lt AVF    Basic Metabolic Panel: Recent Labs  Lab 06/07/23 1254 06/08/23 0726 06/09/23 0350 06/10/23 0445 06/11/23 0454 06/11/23 1237  NA 133* 133* 131* 133* 130* 134*  K 3.4* 4.1 3.8 3.2* 3.7 3.9  CL 92* 92* 91* 94* 95*  --   CO2 27 25 23 26 24   --   GLUCOSE 130* 162* 219* 175* 184*  --   BUN 55* 62* 72* 39* 53*  --   CREATININE 7.76* 8.41* 9.89* 5.89* 7.69*  --   CALCIUM 7.7* 7.7* 7.2* 7.3* 7.0*  --   MG  --  2.0 1.8  --   --   --   PHOS  --  5.3* 6.0* 3.2 4.3  --      Liver Function Tests: Recent Labs  Lab 06/10/23 0445 06/11/23 0454  ALBUMIN 3.0* 2.9*    Recent Labs  Lab 06/07/23 1254  LIPASE 51    No results for input(s): "AMMONIA" in the last 168  hours.  CBC: Recent Labs  Lab 06/07/23 1254 06/08/23 0726 06/09/23 1238 06/10/23 0445 06/11/23 0454 06/11/23 1237  WBC 11.5* 12.4* 11.2* 10.2 9.7  --   HGB 12.4 12.6 10.7* 10.3* 10.5* 10.9*  HCT 41.0 40.2 33.8* 32.8* 33.8* 32.0*  MCV 102.0* 98.8 97.1 97.6 99.7  --   PLT 146* 138* 120* 114* 114*  --      Cardiac Enzymes: No results for input(s): "CKTOTAL", "CKMB", "CKMBINDEX", "TROPONINI" in the last 168 hours.  BNP: Invalid input(s): "POCBNP"  CBG: Recent Labs  Lab 06/10/23 1604 06/10/23 1952 06/10/23 2308 06/11/23 0338 06/11/23 0821  GLUCAP 141* 195* 161* 153* 134*     Microbiology: Results for orders placed or performed during the hospital encounter of 06/07/23  Blood culture (routine x 2)     Status: None (Preliminary result)   Collection Time: 06/07/23 12:54 PM   Specimen: BLOOD  Result Value Ref Range Status   Specimen Description BLOOD RIGHT ANTECUBITAL  Final   Special Requests   Final    BOTTLES DRAWN AEROBIC AND ANAEROBIC Blood Culture adequate volume   Culture   Final    NO GROWTH 3 DAYS Performed at [facility], [address], [place], [place] [address]  Report Status PENDING  Incomplete  Blood culture (routine x 2)     Status: None (Preliminary result)   Collection Time: 06/07/23 12:54 PM   Specimen: BLOOD  Result Value Ref Range Status   Specimen Description BLOOD BLOOD RIGHT FOREARM  Final   Special Requests   Final    BOTTLES DRAWN AEROBIC AND ANAEROBIC Blood Culture adequate volume   Culture   Final    NO GROWTH 3 DAYS Performed at [facility], [address] [place], [place] [address]    Report Status PENDING  Incomplete  MRSA Next Gen by PCR, Nasal     Status: None   Collection Time: 06/07/23 10:18 PM   Specimen: Nasal Mucosa; Nasal Swab  Result Value Ref Range Status   MRSA by PCR Next Gen NOT DETECTED NOT DETECTED Final    Comment: (NOTE) The GeneXpert MRSA Assay (FDA approved for NASAL specimens  only), is one component of a comprehensive MRSA colonization surveillance program. It is not intended to diagnose MRSA infection nor to guide or monitor treatment for MRSA infections. Test performance is not FDA approved in patients less than 70 years old. Performed at [facility], [address] [place], [place] [address]     Coagulation Studies: No results for input(s): "LABPROT", "INR" in the last 72 hours.  Urinalysis: No results for input(s): "COLORURINE", "LABSPEC", "PHURINE", "GLUCOSEU", "HGBUR", "BILIRUBINUR", "KETONESUR", "PROTEINUR", "UROBILINOGEN", "NITRITE", "LEUKOCYTESUR" in the last 72 hours.  Invalid input(s): "APPERANCEUR"    Imaging: No results found.   Medications:    sodium chloride Stopped (06/10/23 1900)   sodium chloride     sodium chloride Stopped (06/11/23 0815)   [MAR Hold] anticoagulant sodium citrate     heparin Stopped (06/11/23 1147)   [MAR Hold] norepinephrine (LEVOPHED) Adult infusion 1 mcg/min (06/11/23 1056)    [MAR Hold] Chlorhexidine Gluconate Cloth  6 each Topical Daily   [MAR Hold] cinacalcet  30 mg Oral Q supper   [MAR Hold] ezetimibe  10 mg Oral Daily   [MAR Hold] feeding supplement (NEPRO CARB STEADY)  237 mL Oral TID BM   [MAR Hold] ferric citrate  420 mg Oral TID WC   [MAR Hold] fludrocortisone  0.1 mg Oral Daily   [MAR Hold] insulin aspart  0-6 Units Subcutaneous Q4H   [MAR Hold] loratadine  10 mg Oral Daily   [MAR Hold] midodrine  10 mg Oral TID WC   [MAR Hold] multivitamin  1 tablet Oral QHS   [MAR Hold] pregabalin  100 mg Oral QHS   [MAR Hold] pregabalin  50 mg Oral BID   [MAR Hold] rosuvastatin  10 mg Oral Daily   [MAR Hold] senna-docusate  1 tablet Oral BID   sodium chloride flush  3 mL Intravenous Q12H   sodium chloride, [MAR Hold] acetaminophen, [MAR Hold] alteplase, [MAR Hold] anticoagulant sodium citrate, [MAR Hold] bisacodyl, [MAR Hold] docusate sodium, Heparin (Porcine) in NaCl, [MAR Hold] heparin, [MAR  Hold] lidocaine (PF), lidocaine (PF), [MAR Hold] lidocaine-prilocaine, [MAR Hold] midodrine, [MAR Hold] ondansetron (ZOFRAN) IV, [MAR Hold] pentafluoroprop-tetrafluoroeth, [MAR Hold] polyethylene glycol, sodium chloride flush  Assessment/ Plan:  Jacqueline [name] is a 77 y.o.  female  with a PMHx of ESRD on HD TTS at [facility], diabetes mellitus type 2, chronic hypotension, peripheral arterial disease, obstructive sleep apnea, GERD, obesity, hyperlipidemia, anemia chronic kidney disease, secondary hyperparathyroidism, who was admitted to [facility] on 06/07/2023 for evaluation of generalized weakness.   1  ESRD on HD TTS.  Patient noted to be hypotensive worse than her prior baseline.  Patient was on Levophed earlier in the morning but now off.  Case discussed with critical care.    Patient due for left and right heart cath today.  Due for hemodialysis afterwards.  Continue careful ultrafiltration given ongoing hypotension.  2.  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  Systolic blood pressure usually in the 80s as an outpatient.  She is on midodrine 10 mg 3 times daily as outpatient.  Remains on pressors.  Right and left heart catheterization being performed today to further evaluate hypotension.  3. Anemia of chronic kidney disease Lab Results  Component Value Date   HGB 10.9 (L) 06/11/2023    Hgb stable. Patient receives Mircera at outpatient clinic.   4. Diabetes mellitus type II with chronic kidney disease/renal manifestations: noninsulin dependent.  Most recent hemoglobin A1c is 5.8 on 06/07/23.   5. Secondary Hyperparathyroidism: with outpatient labs: None   Lab Results  Component Value Date   PTH 388.6 (H) 01/17/2014   CALCIUM 7.0 (L) 06/11/2023   CAION 0.92 (L) 06/11/2023   PHOS 4.3 06/11/2023    Continue Cinacalcet and Auryxia.   LOS: 4 Jacub Waiters 7/10/20241:28 PM

## 2023-06-11 NOTE — Progress Notes (Signed)
 Rounding Note    Patient Name: Jacqueline [name] Date of Encounter: 06/11/2023  [facility] Cardiologist: Muhammad Arida, MD   Subjective   Heart rate stable in the 70 range Remains on nor epi infusion Blood pressure 95-1 02 systolic Family at the bedside Indicates she is willing to undergo catheterization today with Dr. End Discussed echocardiogram findings of pulmonary hypertension Plans for Micra device on hold EP notes reviewed, rates into the 80s yesterday while supine with associated hypotension perhaps indicating rate less of a contributor to hypotension  Inpatient Medications    Scheduled Meds:  Chlorhexidine Gluconate Cloth  6 each Topical Daily   cinacalcet  30 mg Oral Q supper   ezetimibe  10 mg Oral Daily   feeding supplement (NEPRO CARB STEADY)  237 mL Oral TID BM   ferric citrate  420 mg Oral TID WC   fludrocortisone  0.1 mg Oral Daily   insulin aspart  0-6 Units Subcutaneous Q4H   loratadine  10 mg Oral Daily   midodrine  10 mg Oral TID WC   multivitamin  1 tablet Oral QHS   pregabalin  100 mg Oral QHS   pregabalin  50 mg Oral BID   rosuvastatin  10 mg Oral Daily   senna-docusate  1 tablet Oral BID   sodium chloride flush  3 mL Intravenous Q12H   Continuous Infusions:  sodium chloride Stopped (06/10/23 1900)   sodium chloride     sodium chloride Stopped (06/11/23 0815)   anticoagulant sodium citrate     heparin 1,100 Units/hr (06/11/23 1056)   norepinephrine (LEVOPHED) Adult infusion 1 mcg/min (06/11/23 1056)   PRN Meds: sodium chloride, acetaminophen, alteplase, anticoagulant sodium citrate, bisacodyl, docusate sodium, heparin, lidocaine (PF), lidocaine-prilocaine, midodrine, ondansetron (ZOFRAN) IV, pentafluoroprop-tetrafluoroeth, polyethylene glycol, sodium chloride flush   Vital Signs    Vitals:   06/11/23 1029 06/11/23 1038 06/11/23 1042 06/11/23 1100  BP:  (!) 95/44 (!) 102/39 (!) 102/43  Pulse: (!) 59 (!) 59 60   Resp: 16 18  18 17  Temp: 98.1 F (36.7 C)     TempSrc: Oral     SpO2: 97% 100% 96%   Weight:      Height:        Intake/Output Summary (Last 24 hours) at 06/11/2023 1120 Last data filed at 06/11/2023 1056 Gross per 24 hour  Intake 1324.71 ml  Output --  Net 1324.71 ml      06/11/2023    4:45 AM 06/10/2023    4:11 AM 06/09/2023    4:22 PM  Last 3 Weights  Weight (lbs) 259 lb 7.7 oz 259 lb 11.2 oz 254 lb 6.6 oz  Weight (kg) 117.7 kg 117.8 kg 115.4 kg      Telemetry    Normal sinus rhythm rate 60-70- Personally Reviewed  ECG    - Personally Reviewed  Physical Exam   GEN: No acute distress.   Neck: No JVD Cardiac: RRR, no murmurs, rubs, or gallops.  Respiratory: Clear to auscultation bilaterally. GI: Soft, nontender, non-distended  MS: No edema; No deformity. Neuro:  Nonfocal  Psych: Normal affect   Labs    High Sensitivity Troponin:  No results for input(s): "TROPONINIHS" in the last 720 hours.   Chemistry Recent Labs  Lab 06/08/23 0726 06/09/23 0350 06/10/23 0445 06/11/23 0454  NA 133* 131* 133* 130*  K 4.1 3.8 3.2* 3.7  CL 92* 91* 94* 95*  CO2 25 23 26 24  GLUCOSE 162* 219* 175*   184*  BUN 62* 72* 39* 53*  CREATININE 8.41* 9.89* 5.89* 7.69*  CALCIUM 7.7* 7.2* 7.3* 7.0*  MG 2.0 1.8  --   --   ALBUMIN  --   --  3.0* 2.9*  GFRNONAA 4* 4* 7* 5*  ANIONGAP 16* 17* 13 11    Lipids No results for input(s): "CHOL", "TRIG", "HDL", "LABVLDL", "LDLCALC", "CHOLHDL" in the last 168 hours.  Hematology Recent Labs  Lab 06/09/23 1238 06/10/23 0445 06/11/23 0454  WBC 11.2* 10.2 9.7  RBC 3.48* 3.36* 3.39*  HGB 10.7* 10.3* 10.5*  HCT 33.8* 32.8* 33.8*  MCV 97.1 97.6 99.7  MCH 30.7 30.7 31.0  MCHC 31.7 31.4 31.1  RDW 15.3 15.3 15.3  PLT 120* 114* 114*   Thyroid  Recent Labs  Lab 06/07/23 1254  TSH 1.497  1.491  FREET4 0.95    BNPNo results for input(s): "BNP", "PROBNP" in the last 168 hours.  DDimer No results for input(s): "DDIMER" in the last 168 hours.    Radiology    No results found.  Cardiac Studies   Echo   1. Left ventricular ejection fraction, by estimation, is 60 to 65%. The  left ventricle has normal function. The left ventricle has no regional  wall motion abnormalities. Left ventricular diastolic parameters are  indeterminate.   2. Right ventricular systolic function is moderately reduced. The right  ventricular size is moderately enlarged. There is severely elevated  pulmonary artery systolic pressure. The estimated right ventricular  systolic pressure is 79.0 mmHg.   3. Left atrial size was mildly dilated.   4. The mitral valve is normal in structure. Mild mitral valve  regurgitation. No evidence of mitral stenosis.   5. Tricuspid valve regurgitation is moderate.   6. The aortic valve is normal in structure. Aortic valve regurgitation is  not visualized. Aortic valve sclerosis is present, with no evidence of  aortic valve stenosis.   7. The inferior vena cava is dilated in size with <50% respiratory  variability, suggesting right atrial pressure of 15 mmHg.   Patient Profile     Jacqueline [name] is a 77 y.o. female with a history of CAD status post RCA stenting, end-stage renal disease on hemodialysis, hypertension, hypotension, hyperlipidemia, bradycardia, persistent atrial fibrillation, type 2 diabetes mellitus, peripheral arterial disease, sleep apnea, and obesity who is being seen today for the evaluation of bradycardia/slow A-fib and hypotension   Assessment & Plan     A/P: Hypotension/bradycardia Chronic finding, despite treatment with midodrine  daily, extra midodrine on dialysis days Persistent hypotension, may be unrelated to her bradycardia given heart rate yesterday in the 80s while supine with associated hypotension Initial plans for Micra pacemaker device on hold Schedule for right and left heart catheterization today Heart rate and blood pressure augmented by nor epi infusion Options for treatment  may be limited in the setting of pulmonary hypertension Hypotension limiting regular dialysis   Permanent atrial fibrillation with slow ventricular rate Noted previously on Zio monitor with rates in the 40s for long periods of time Hypotension and bradycardia limiting her hemodialysis -Improved numbers remain infusion, changed to nor epi given side effects EP following, no immediate plans for Micra device   Coronary artery disease Prior non-STEMI 2018 PCI to the RCA On statin, Zetia,  Eliquis on hold -Plan for right and left heart catheterization today   Hyperlipidemia Continue statin with Zetia   End-stage renal disease On midodrine for blood pressure support HD has been limited secondary to hypotension     Type 2 diabetes On insulin   Discussion today concerning risk and benefit of catheterization, family at the bedside, she is indicated she would like to proceed, discussed potential findings  Total encounter time more than 35 minutes  Greater than 50% was spent in counseling and coordination of care with the patient   For questions or updates, please contact [facility] Please consult [email or web address] for contact info under        Signed, Laval Cafaro, MD  06/11/2023, 11:20 AM    

## 2023-06-11 NOTE — Progress Notes (Addendum)
1705: Patient complained of Chest Heaviness. ICU RN notified. Decreased BFR to 300 and paused UF. Nephrology MD notified.  1745: Patient verbalizes relief of chest pressure 15 mins after giving Morphine(from 10 to 5) and states that she feels a lot better.   1800: UF resumed at and increased BFR to 350 as tolerated. Updated Dr. Cherylann Ratel.

## 2023-06-11 NOTE — Progress Notes (Signed)
Pt c/o tightness/pressure pain in center of chest shortly after dialysis initiation. Dr Belia Heman given update on pt condition, 12 lead EKG complete, and troponin to be obtained.

## 2023-06-11 NOTE — Consult Note (Signed)
PHARMACY CONSULT NOTE - FOLLOW UP  Pharmacy Consult for Electrolyte Monitoring and Replacement   Recent Labs: Potassium (mmol/L)  Date Value  06/11/2023 3.7   Magnesium (mg/dL)  Date Value  54/08/8118 1.8   Calcium (mg/dL)  Date Value  14/78/2956 7.0 (L)   Albumin (g/dL)  Date Value  21/30/8657 2.9 (L)  10/10/2021 4.1   Phosphorus (mg/dL)  Date Value  84/69/6295 4.3   Sodium (mmol/L)  Date Value  06/11/2023 130 (L)  10/10/2021 142     Assessment: 77 y.o female with significant PMH of HTN, T2DM,ESRD on HD TTS, Chronic debility , CAD s/p stent, PAF,  OSA, PAD, Left knee arthritis, Chronic lower back pain,GERD,Obesity , HLD  who presented to the ED  from Dialysis with chief complaints of generalized weakness x 4 days.   Goal of Therapy:  Within normal limits  Plan:  No replacement warranted F/u with AM labs.   Elliot Gurney, PharmD, BCPS Clinical Pharmacist  06/11/2023 8:08 AM

## 2023-06-11 NOTE — Progress Notes (Signed)
NAME:  Jacqueline [name], MRN:  [id number], DOB:  1946/06/03, LOS: 4 ADMISSION DATE:  06/07/2023, CONSULTATION DATE:  06/07/2023 REFERRING MD:  Dr. Allena Katz, CHIEF COMPLAINT:  Hypotension   Brief Pt Description / Synopsis:  77 y.o woman with PMHx significant for chronic hypotension (SBP in 80's on Midodrine 10 mg TID) and ESRD on HD (TTS) admitted with Cardiogenic shock due to Bradycardia.   History of Present Illness:  77 y.o female with significant PMHx significant for HTN, T2DM, ESRD on HD TTS, Chronic debility, CAD s/p stent, PAF, OSA, PAD, Left knee arthritis, Chronic lower back pain, GERD, Obesity, HLD who presented to the ED from Dialysis with generalized weakness x 4 days.   EMS report: NVD and LLQ abdominal pain since last night. States tried to eat yesterday, each time she ate she vomited, then had diarrhea last night. No NVD today. She went to Dialysis today and was noted with low BP of 72/42. She usually runs lows BP and takes Midodrine but today her BP was lower than usually so she was not dialyzed but was sent to ED for further evaluation.   ED Course:  Initial vital signs showed HR of 50 beats/minute, BP 81/51 mm Hg, the RR 16 breaths/minute, and the oxygen saturation 91% on 2L and a temperature of 97.22F (36.6C).   Pertinent Labs/Diagnostics.   Na+/ K+: 133/3.4 Glucose:130 BUN/Cr.:55/7.76 WBC:11.5 Plts: 146 PCT: pending Lactic acid: 1.3 EKG:?Afib vs junctional bradycardia rate 40bmp. Incomplete RBB,   CXR, CT scan obtained and show no signs of infection or other abnormality. Patient given, midodrine, atropine for significant bradycardia and  500 cc of fluids and started on broad-spectrum antibiotics Vanco cefepime and Flagyl for  suspected sepsis of unknown source. Patient remained hypotensive despite  intervention as above. PCCM consulted for possible pressors.  Pertinent Medical History:   Past Medical History:  Diagnosis Date   Arthritis    "left knee" (12/04/2016)   CAD  (coronary artery disease)    a. 12/2016 NSTEMI/PCI: LM nl, LaD 20p/d, D1 20, OM2 20, OM3 20, RCA 22m (3.0x18 Resolute DES). EF 65%.   Chronic lower back pain    Diverticulosis    on CT   ESRD on dialysis [facility])    GERD (gastroesophageal reflux disease)    Hiatal hernia    History of echocardiogram    a. 09/2022 Echo: EF 60-65%, mild asymm LVH, mod reduced RV fxn, RVSP . Mildly dil RA. Mild AI. Ao sclerosis. Mild MR.   Hyperlipidemia    Hypertension    a. 12/2016 labetalol d/c'd 2/2 hypotension in HD.   Morbid obesity (HCC)    Obesity    Persistent atrial fibrillation (HCC)    a. 05/2023 Zio: 100% afib avg HR 52 (32-99).   Pneumonia    "couple times" (12/04/2016)   Type II diabetes mellitus (HCC)    Micro Data:  7/6: Blood culture x2>> no growth to date 7/6: MRSA PCR>>negative  Antimicrobials:   Anti-infectives (From admission, onward)    Start     Dose/Rate Route Frequency Ordered Stop   06/10/23 1200  vancomycin (VANCOCIN) IVPB 1000 mg/200 mL premix  Status:  Discontinued       See Hyperspace for full Linked Orders Report.   1,000 mg 200 mL/hr over 60 Minutes Intravenous Every T-Th-Sa (Hemodialysis) 06/08/23 1058 06/09/23 1029   06/08/23 1145  vancomycin (VANCOCIN) IVPB 1000 mg/200 mL premix       See Hyperspace for full Linked Orders Report.  1,000 mg 200 mL/hr over 60 Minutes Intravenous  Once 06/08/23 1058 06/08/23 1655   06/08/23 1100  piperacillin-tazobactam (ZOSYN) IVPB 2.25 g  Status:  Discontinued        2.25 g 100 mL/hr over 30 Minutes Intravenous Every 8 hours 06/08/23 1058 06/09/23 1029   06/07/23 1400  metroNIDAZOLE (FLAGYL) IVPB 500 mg        500 mg 100 mL/hr over 60 Minutes Intravenous  Once 06/07/23 1346 06/07/23 1652   06/07/23 1400  ceFEPIme (MAXIPIME) 2 g in sodium chloride 0.9 % 100 mL IVPB        2 g 200 mL/hr over 30 Minutes Intravenous  Once 06/07/23 1349 06/07/23 1512   06/07/23 1400  vancomycin (VANCOCIN) IVPB 1000 mg/200 mL premix         1,000 mg 200 mL/hr over 60 Minutes Intravenous  Once 06/07/23 1349 06/07/23 1652      Significant Hospital Events: Including procedures, antibiotic start and stop dates in addition to other pertinent events   06/07/2023: admit to ICU with bradycardia, started on dopamine 06/08/2023: weaned off dopamine, hypotensive again, dopamine restarted 06/09/2023:  Remains hypotensive requiring Levophed, (pt is vasculopath, doubt accuracy of cuff BP as pt is awake, alert, and asymptomatic).  Attempted Arterial line but unsuccessful, Vascular Surgery consulted to assist with placement. Nephrology consulted, received HD. 06/10/2023: Continues to require Levophed (6 mcg currently), when Levo weaned down does exhibit some Bradycardia in the upper 50's.  Cardiology considering R Heart Cath today. Holding on transfer to [facility] for pacer at this time. 06/11/2023: Levophed weaning down, this AM. LHC/RHC today with plan for HD post-cath.  Interim History / Subjective:  No significant events overnight Overall feeling well, no complaints this morning, has questions about heart cath today Required uptitration of Levo to , now down to this AM HR sustaining in 70s Plan for LHC/RHC ~1130, if patient continues to desire more aggressive interventions HD post-cath without UF  Objective:  Blood pressure (!) 148/44, pulse 86, temperature 98 F (36.7 C), temperature source Oral, resp. rate 14, height 5\' 4"  (1.626 m), weight 117.7 kg, SpO2 97 %.        Intake/Output Summary (Last 24 hours) at 06/11/2023 0745 Last data filed at 06/11/2023 0600 Gross per 24 hour  Intake 1312.84 ml  Output --  Net 1312.84 ml    Filed Weights   06/09/23 1622 06/10/23 0411 06/11/23 0445  Weight: 115.4 kg 117.8 kg 117.7 kg   Physical Examination: General: Chronically ill-appearing elderly woman in NAD. Pleasant and conversant. HEENT: [place]/AT, anicteric sclera, PERRL, dry mucous membranes. Neuro: Awake, oriented x 4. Responds  to verbal stimuli. Following commands consistently. Moves all 4 extremities spontaneously. Generalized weakness/deconditioning. CV: Irregularly irregular rhythm, rate 70s, no m/g/r. PULM: Breathing even and unlabored on 2LNC. Lung fields diminished with faint crackles at bilateral bases. GI: Soft, nontender, mildly distended. Hypoactive bowel sounds. Extremities: No significant LE edema noted. Skin: Warm/dry, no rashes.  Resolved Hospital Problem List:     Assessment & Plan:   #Circulatory Shock: Suspect Cardiogenic given Bradycardia, improving #Afib with slow ventricular rate, improved History of CAD, HLD, permanent A. Fib, chronic hypotension with SBP in 80's on Midodrine 10 mg TID Echo 7/7 with LVEF 60-65%, indeterminate diastolic parameters, RV systolic function mildly reduced, severe pulmonary hypertension, moderate tricuspid regurgitation. TSH/T4 WNL. - Goal MAP > 65 - Avoiding fluid resuscitation in the setting of ESRD/HD - Levophed titrated to goal MAP; appears to do best  with low doses/not completely off-titrated; HR drops with complete weaning - Continue midodrine TID - Continue Florinef - Continuous cardiac monitoring - Optimize electrolytes for K > 4, Mg > 2 - Heparin gtt, transition back to Eliquis when no longer requiring procedures - Secondary prevention with Zetia, Crestor - Appreciate Cardiology/EP input; plan for LHC/RHC today 7/10 - Eventual consideration of leadless PM placement with EP if patient continues to desire more aggressive interventions  #Initial Concern for Sepsis of unknown etiology ~ LOW SUSPICION FOR SEPSIS No clear sign of sepsis though patient is hypotensive. Blood cultures are drawn and are negative to date.  CXR without sign of pneumonia, unable to make urine - Trend WBC, fever curve; leukocytosis improved - F/u Cx data, BCx 7/6 NGTD - Off of antibiotics, low threshold to resume if patient becomes febrile/WBC uptrends  #ESRD on HD   #Hyponatremia #AG Metabolic Acidosis Outpatient - on HD TThS via LUE AVF); dry weight 115kg. 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 - Trend H&H - Monitor for signs of active bleeding while on heparin gtt/AC - Transfuse for Hgb < 7.0 or hemodynamically significant bleeding  #T2DM  - SSI - CBGs Q4H - Goal CBG 140-180  GOC - Remains FULL code status at present - Pending LHC/RHC results today, may warrant PMT involvement - If unable to tolerate aggressive interventions, may be more appropriate for shifting to comfort-focused care  Best Practice (right click and "Reselect all SmartList Selections" daily)   Diet/type: NPO for potential Cath DVT prophylaxis: Heparin gtt GI prophylaxis: N/A Lines: Right radial A-line, and is still needed Foley:  N/A Code Status:  full code Last date of multidisciplinary goals of care discussion [7/10 - patient updated at bedside; plan to continue with aggressive interventions at present]  Labs   CBC: Recent Labs  Lab 06/07/23 1254 06/08/23 0726 06/09/23 1238 06/10/23 0445 06/11/23 0454  WBC 11.5* 12.4* 11.2* 10.2 9.7  HGB 12.4 12.6 10.7* 10.3* 10.5*  HCT 41.0 40.2 33.8* 32.8* 33.8*  MCV 102.0* 98.8 97.1 97.6 99.7  PLT 146* 138* 120* 114* 114*   Basic Metabolic Panel: Recent Labs  Lab 06/07/23 1254 06/08/23 0726 06/09/23 0350 06/10/23 0445 06/11/23 0454  NA 133* 133* 131* 133* 130*  K 3.4* 4.1 3.8 3.2* 3.7  CL 92* 92* 91* 94* 95*  CO2 27 25 23 26 24   GLUCOSE 130* 162* 219* 175* 184*  BUN 55* 62* 72* 39* 53*  CREATININE 7.76* 8.41* 9.89* 5.89* 7.69*  CALCIUM 7.7* 7.7* 7.2* 7.3* 7.0*  MG  --  2.0 1.8  --   --   PHOS  --  5.3* 6.0* 3.2 4.3   GFR: Estimated  Creatinine Clearance: 7.5 mL/min (A) (by C-G formula based on SCr of 7.69 mg/dL (H)). Recent Labs  Lab 06/07/23 1254 06/08/23 0726 06/09/23 1238 06/09/23 1739 06/10/23 0445 06/11/23 0454  WBC 11.5* 12.4* 11.2*  --  10.2 9.7  LATICACIDVEN 1.3  --  0.7 0.6  --   --    Liver Function Tests: Recent Labs  Lab 06/10/23 0445 06/11/23 0454  ALBUMIN 3.0* 2.9*    Recent Labs  Lab 06/07/23 1254  LIPASE 51    No results for input(s): "AMMONIA" in the last 168 hours.  ABG:  Component Value Date/Time   PHART 7.43 05/21/2022 2300   PCO2ART 42 05/21/2022 2300   PO2ART 66 (L) 05/21/2022 2300   HCO3 27.9 05/21/2022 2300   TCO2 31 01/19/2014 1614   ACIDBASEDEF 1.0 01/19/2014 1238   O2SAT 95.2 05/21/2022 2300   Coagulation Profile: No results for input(s): "INR", "PROTIME" in the last 168 hours.  Cardiac Enzymes: No results for input(s): "CKTOTAL", "CKMB", "CKMBINDEX", "TROPONINI" in the last 168 hours.  HbA1C: Hemoglobin A1C  Date/Time Value Ref Range Status  06/12/2021 12:00 AM 6.6  Final   Hgb A1c MFr Bld  Date/Time Value Ref Range Status  06/07/2023 07:44 PM 5.8 (H) 4.8 - 5.6 % Final    Comment:    (NOTE) Pre diabetes:          5.7%-6.4%  Diabetes:              >6.4%  Glycemic control for   <7.0% adults with diabetes   09/29/2022 04:55 PM 5.2 4.8 - 5.6 % Final    Comment:    (NOTE) Pre diabetes:          5.7%-6.4%  Diabetes:              >6.4%  Glycemic control for   <7.0% adults with diabetes    CBG: Recent Labs  Lab 06/10/23 1132 06/10/23 1604 06/10/23 1952 06/10/23 2308 06/11/23 0338  GLUCAP 147* 141* 195* 161* 153*    Past Medical History:  She,  has a past medical history of Arthritis, CAD (coronary artery disease), Chronic lower back pain, Diverticulosis, ESRD on dialysis (HCC), GERD (gastroesophageal reflux disease), Hiatal hernia, History of echocardiogram, Hyperlipidemia, Hypertension, Morbid obesity (HCC), Obesity, Persistent atrial  fibrillation (HCC), Pneumonia, and Type II diabetes mellitus (HCC).   Surgical History:   Past Surgical History:  Procedure Laterality Date   ABDOMINAL HYSTERECTOMY     AV FISTULA PLACEMENT Left 01/27/2014   Procedure: ARTERIOVENOUS (AV) FISTULA CREATION;  Surgeon: Pryor Ochoa, MD;  Location: [facility] OR;  Service: Vascular;  Laterality: Left;   AV FISTULA REPAIR     "had it cleaned out"   CARDIAC CATHETERIZATION  ?1980s   CARDIAC CATHETERIZATION N/A 12/04/2016   Procedure: Left Heart Cath Angiography;  Surgeon: Kathleene Hazel, MD;  Location: [facility] INVASIVE CV LAB;  Service: Cardiovascular;  Laterality: N/A;   CARDIAC CATHETERIZATION N/A 12/04/2016   Procedure: Coronary Stent Intervention;  Surgeon: Kathleene Hazel, MD;  Location: [facility] INVASIVE CV LAB;  Service: Cardiovascular;  Laterality: N/A;  Mid RCA   CATARACT EXTRACTION W/ INTRAOCULAR LENS IMPLANT Left    COLONOSCOPY W/ BIOPSIES AND POLYPECTOMY     CORONARY ANGIOPLASTY WITH STENT PLACEMENT  12/04/2016   ESOPHAGOGASTRODUODENOSCOPY     INSERTION OF DIALYSIS CATHETER N/A 01/19/2014   Procedure: INSERTION OF DIALYSIS CATHETER;  Surgeon: Larina Earthly, MD;  Location: [facility] OR;  Service: Vascular;  Laterality: N/A;    Social History:   reports that she quit smoking about 39 years ago. Her smoking use included cigarettes. She has a 20.00 pack-year smoking history. She has never used smokeless tobacco. She reports that she does not drink alcohol and does not use drugs.   Family History:  Her family history includes Breast cancer in her sister; CAD in her father and sister; Cancer in her brother; Diabetes in her mother; Heart attack in her father; Heart disease in her father; Hypertension in her father; Peripheral vascular disease in her mother.   Allergies Allergies  Allergen Reactions  Sulfa Antibiotics Other (See Comments)   Sulfa Drugs Cross Reactors Other (See Comments)    Doesn't remember     Home Medications  Prior to Admission  medications   Medication Sig Start Date End Date Taking? Authorizing Provider  acetaminophen (TYLENOL) 650 MG CR tablet Take 1,300 mg by mouth every 8 (eight) hours as needed for pain.    [provider]  albuterol (VENTOLIN HFA) 108 (90 Base) MCG/ACT inhaler Inhale 1-2 puffs into the lungs every 6 (six) hours as needed for wheezing or shortness of breath. 12/15/21   Franne Forts, DO  apixaban (ELIQUIS) 5 MG TABS tablet Take 1 tablet (5 mg total) by mouth 2 (two) times daily. 10/02/22   Tresa Moore, MD  B Complex-C-Zn-Folic Acid (DIALYVITE/ZINC) TABS Take 1 tablet by mouth daily.    [provider]  bisacodyl (DULCOLAX) 5 MG EC tablet Take 2 tablets (10 mg total) by mouth daily as needed for moderate constipation. 10/02/22   Tresa Moore, MD  cinacalcet (SENSIPAR) 30 MG tablet Take 30 mg by mouth daily with supper.    [provider]  ezetimibe (ZETIA) 10 MG tablet Take 1 tablet (10 mg total) by mouth daily. 04/30/22   Iran Ouch, MD  ferric citrate (AURYXIA) 1 GM 210 MG(Fe) tablet Take 420 mg by mouth 3 (three) times daily with meals.    [provider]  fexofenadine (ALLEGRA) 180 MG tablet Take 180 mg by mouth daily.    [provider]  midodrine (PROAMATINE) 10 MG tablet Take 20 mg by mouth daily. (Take additional two tablets [20mg ] for administration at dialysis on Tuesday, Thursday and Saturday)    [provider]  midodrine (PROAMATINE) 10 MG tablet Take 20 mg by mouth daily as needed (for SBP <100).    [provider]  Omega-3 Fatty Acids (FISH OIL) 1000 MG CPDR Take 1,000 mg by mouth daily.    [provider]  omeprazole (PRILOSEC) 20 MG capsule Take 1 capsule (20 mg total) by mouth daily. Patient taking differently: Take 20 mg by mouth 2 (two) times daily. 02/21/22   Arnette Felts, FNP  ondansetron (ZOFRAN-ODT) 4 MG disintegrating tablet Take 4 mg by mouth every 8 (eight) hours as needed for nausea or  vomiting.    [provider]  polyethylene glycol (MIRALAX / GLYCOLAX) 17 g packet Take 17 g by mouth at bedtime.    [provider]  pregabalin (LYRICA) 100 MG capsule Take 100 mg by mouth at bedtime.    [provider]  pregabalin (LYRICA) 50 MG capsule Take 50 mg by mouth 2 (two) times daily.    [provider]  rosuvastatin (CRESTOR) 10 MG tablet Take 1 tablet (10 mg total) by mouth daily. 10/03/22   Tresa Moore, MD  sennosides-docusate sodium (SENOKOT-S) 8.6-50 MG tablet Take 1 tablet by mouth 2 (two) times daily.    [provider]  trolamine salicylate (ASPERCREME) 10 % cream Apply 1 Application topically 2 (two) times daily. (Apply to knees)    [provider]    Critical care time:   The patient is critically ill with multiple organ system failure and requires high complexity decision making for assessment and support, frequent evaluation and titration of therapies, advanced monitoring, review of radiographic studies and interpretation of complex data.   Critical Care Time devoted to patient care services, exclusive of separately billable procedures, described in this note is 38 minutes.  Tim Lair, PA-C  [facility] 06/11/23 9:02 AM  Please see [email or web address] for pager details. After hours, please call ELink [phone]

## 2023-06-11 NOTE — Progress Notes (Addendum)
ANTICOAGULATION CONSULT NOTE  Pharmacy Consult for IV heparin Indication: atrial fibrillation  Allergies  Allergen Reactions   Sulfa Antibiotics Other (See Comments)   Sulfa Drugs Cross Reactors Other (See Comments)    Doesn't remember     Patient Measurements: Height: 5\' 4"  (162.6 cm) Weight: 117.7 kg (259 lb 7.7 oz) IBW/kg (Calculated) : 54.7 Heparin Dosing Weight: 115.5 kg  Vital Signs: Temp: 98 F (36.7 C) (07/10 0345) Temp Source: Oral (07/10 0345) Pulse Rate: 86 (07/10 0400)  Labs: Recent Labs    06/09/23 0350 06/09/23 1238 06/09/23 2314 06/10/23 0445 06/10/23 1740 06/11/23 0454  HGB  --  10.7*  --  10.3*  --  10.5*  HCT  --  33.8*  --  32.8*  --  33.8*  PLT  --  120*  --  114*  --  114*  APTT  --   --  >200*  --  >200*  --   HEPARINUNFRC  --   --   --  >1.10*  --  >1.10*  CREATININE 9.89*  --   --  5.89*  --  7.69*     Estimated Creatinine Clearance: 7.5 mL/min (A) (by C-G formula based on SCr of 7.69 mg/dL (H)).   Medical History: Past Medical History:  Diagnosis Date   Arthritis    "left knee" (12/04/2016)   CAD (coronary artery disease)    a. 12/2016 NSTEMI/PCI: LM nl, LaD 20p/d, D1 20, OM2 20, OM3 20, RCA 90m (3.0x18 Resolute DES). EF 65%.   Chronic lower back pain    Diverticulosis    on CT   ESRD on dialysis [facility])    GERD (gastroesophageal reflux disease)    Hiatal hernia    History of echocardiogram    a. 09/2022 Echo: EF 60-65%, mild asymm LVH, mod reduced RV fxn, RVSP . Mildly dil RA. Mild AI. Ao sclerosis. Mild MR.   Hyperlipidemia    Hypertension    a. 12/2016 labetalol d/c'd 2/2 hypotension in HD.   Morbid obesity (HCC)    Obesity    Persistent atrial fibrillation (HCC)    a. 05/2023 Zio: 100% afib avg HR 52 (32-99).   Pneumonia    "couple times" (12/04/2016)   Type II diabetes mellitus (HCC)     Medications:  Eliquis prior to admission (last dose 7/7 @2100 )  Assessment: 77 year old female admitted with circulatory shock,  of which has improved. Plan is for PPM, and thus holding Eliquis prior to procedure.   Goal of Therapy:  aPTT 66-102 seconds Monitor platelets by anticoagulation protocol: Yes  07/08 2314 aPTT > 200, Supratherapeutic @ 1800 units/hr 07/09 1750 aPTT > 200, supratherapeutic 1800 > 1400 units/hr   Plan:  Heparin remains supratherapeutic Contacted RN will hold infusion x 1 hour Reduce heparin rate to 1100 units/hr aPTT 8 hours after restart CBC daily   Otelia Sergeant, PharmD, [facility] 06/11/2023 6:14 AM

## 2023-06-12 ENCOUNTER — Encounter: Payer: Self-pay | Admitting: Internal Medicine

## 2023-06-12 DIAGNOSIS — R001 Bradycardia, unspecified: Secondary | ICD-10-CM | POA: Diagnosis not present

## 2023-06-12 DIAGNOSIS — Z7189 Other specified counseling: Secondary | ICD-10-CM

## 2023-06-12 DIAGNOSIS — N186 End stage renal disease: Secondary | ICD-10-CM | POA: Diagnosis not present

## 2023-06-12 DIAGNOSIS — I4891 Unspecified atrial fibrillation: Secondary | ICD-10-CM

## 2023-06-12 DIAGNOSIS — I2510 Atherosclerotic heart disease of native coronary artery without angina pectoris: Secondary | ICD-10-CM | POA: Diagnosis not present

## 2023-06-12 DIAGNOSIS — I959 Hypotension, unspecified: Secondary | ICD-10-CM

## 2023-06-12 DIAGNOSIS — I4819 Other persistent atrial fibrillation: Secondary | ICD-10-CM

## 2023-06-12 DIAGNOSIS — I272 Pulmonary hypertension, unspecified: Secondary | ICD-10-CM | POA: Diagnosis not present

## 2023-06-12 LAB — HEPARIN LEVEL (UNFRACTIONATED): Heparin Unfractionated: 0.81 IU/mL — ABNORMAL HIGH (ref 0.30–0.70)

## 2023-06-12 LAB — CBC
HCT: 34 % — ABNORMAL LOW (ref 36.0–46.0)
Hemoglobin: 10.6 g/dL — ABNORMAL LOW (ref 12.0–15.0)
MCH: 30.7 pg (ref 26.0–34.0)
MCHC: 31.2 g/dL (ref 30.0–36.0)
MCV: 98.6 fL (ref 80.0–100.0)
Platelets: 122 10*3/uL — ABNORMAL LOW (ref 150–400)
RBC: 3.45 MIL/uL — ABNORMAL LOW (ref 3.87–5.11)
RDW: 15 % (ref 11.5–15.5)
WBC: 9.2 10*3/uL (ref 4.0–10.5)
nRBC: 0 % (ref 0.0–0.2)

## 2023-06-12 LAB — RENAL FUNCTION PANEL
Albumin: 2.9 g/dL — ABNORMAL LOW (ref 3.5–5.0)
Anion gap: 12 (ref 5–15)
BUN: 29 mg/dL — ABNORMAL HIGH (ref 8–23)
CO2: 27 mmol/L (ref 22–32)
Calcium: 7.6 mg/dL — ABNORMAL LOW (ref 8.9–10.3)
Chloride: 92 mmol/L — ABNORMAL LOW (ref 98–111)
Creatinine, Ser: 4.8 mg/dL — ABNORMAL HIGH (ref 0.44–1.00)
GFR, Estimated: 9 mL/min — ABNORMAL LOW (ref >60)
Glucose, Bld: 202 mg/dL — ABNORMAL HIGH (ref 70–99)
Phosphorus: 2.6 mg/dL (ref 2.5–4.6)
Potassium: 3.3 mmol/L — ABNORMAL LOW (ref 3.5–5.1)
Sodium: 131 mmol/L — ABNORMAL LOW (ref 135–145)

## 2023-06-12 LAB — GLUCOSE, CAPILLARY
Glucose-Capillary: 130 mg/dL — ABNORMAL HIGH (ref 70–99)
Glucose-Capillary: 167 mg/dL — ABNORMAL HIGH (ref 70–99)
Glucose-Capillary: 197 mg/dL — ABNORMAL HIGH (ref 70–99)
Glucose-Capillary: 203 mg/dL — ABNORMAL HIGH (ref 70–99)
Glucose-Capillary: 209 mg/dL — ABNORMAL HIGH (ref 70–99)
Glucose-Capillary: 273 mg/dL — ABNORMAL HIGH (ref 70–99)
Glucose-Capillary: 274 mg/dL — ABNORMAL HIGH (ref 70–99)

## 2023-06-12 LAB — APTT
aPTT: 61 seconds — ABNORMAL HIGH (ref 24–36)
aPTT: 87 seconds — ABNORMAL HIGH (ref 24–36)

## 2023-06-12 LAB — MAGNESIUM: Magnesium: 2 mg/dL (ref 1.7–2.4)

## 2023-06-12 LAB — TROPONIN I (HIGH SENSITIVITY): Troponin I (High Sensitivity): 34 ng/L — ABNORMAL HIGH (ref <18)

## 2023-06-12 MED ORDER — ORAL CARE MOUTH RINSE: 15.0000 mL | OROMUCOSAL | Status: DC | PRN

## 2023-06-12 MED ORDER — ALUM & MAG HYDROXIDE-SIMETH 200-200-20 MG/5ML PO SUSP
15.0000 mL | ORAL | Status: DC | PRN
  Administered 2023-06-12 – 2023-06-16 (×2): 15 mL via ORAL
  Filled 2023-06-12 (×2): qty 30

## 2023-06-12 MED ORDER — DROXIDOPA 100 MG PO CAPS
100.0000 mg | ORAL_CAPSULE | Freq: Three times a day (TID) | ORAL | Status: DC
  Administered 2023-06-12 (×2): 100 mg via ORAL
  Filled 2023-06-12 (×4): qty 1

## 2023-06-12 MED ORDER — INSULIN ASPART 100 UNIT/ML IJ SOLN
0.0000 [IU] | Freq: Three times a day (TID) | INTRAMUSCULAR | Status: DC
  Administered 2023-06-12: 1 [IU] via SUBCUTANEOUS
  Administered 2023-06-12: 2 [IU] via SUBCUTANEOUS
  Filled 2023-06-12 (×2): qty 1

## 2023-06-12 MED ORDER — INSULIN ASPART 100 UNIT/ML IJ SOLN
0.0000 [IU] | Freq: Every day | INTRAMUSCULAR | Status: DC
  Administered 2023-06-12: 3 [IU] via SUBCUTANEOUS
  Filled 2023-06-12 (×2): qty 1

## 2023-06-12 MED ORDER — HYDROCORTISONE SOD SUC (PF) 100 MG IJ SOLR
50.0000 mg | Freq: Four times a day (QID) | INTRAMUSCULAR | Status: DC
  Administered 2023-06-12 – 2023-06-13 (×4): 50 mg via INTRAVENOUS
  Filled 2023-06-12 (×4): qty 2

## 2023-06-12 NOTE — Progress Notes (Addendum)
0229 - Pt called out for help at approximately 0150. This nurse entered into room and noted pt's BP to be 227/80. Pt c/o chest tightness and asking if her BP is elevated. This nurse paused Levophed briefly until pt's BP lower. Pt tachycardic to 110's. Denies SOB, dizziness, headache, nausea and vision changes. EKG obtained. Provider at bedside. Stat labs sent. PRN Morphine administered per EMAR. After HTN episode, pt noted to be bradycardic to 29, though not sustaining bradycardia. Due to pt not sustaining bradycardia, provider did not want to start any medication. Pt remained A&O x4 during episode of HTN. Slight improvement of pain upon administration of PRN  Morphine and repositioning.   1610 - Pt c/o left shoulder pain. Vital signs stable. Morphine administered per EMAR. EKG obtained. Provider notified via secure page.

## 2023-06-12 NOTE — Progress Notes (Signed)
Rounding Note    Patient Name: Jacqueline [name] Date of Encounter: 06/12/2023  [facility] Cardiologist: Lorine Bears, MD   Subjective   Heart rate stable in the 70  -90 range Remains on nor epi Blood pressure through A-line 90 systolic up to 108 Systolic was 120 up to 130, lower after slight decrease in nor epi dosing No family at the bedside Right left heart catheterization performed yesterday, no coronary intervention performed Severe pulmonary hypertension Vascular to evaluate AV fistula Atypical chest pain, abdominal symptoms, repositioned in bed Started on Mylanta  Inpatient Medications    Scheduled Meds:  Chlorhexidine Gluconate Cloth  6 each Topical Daily   cinacalcet  30 mg Oral Q supper   droxidopa  100 mg Oral TID WC   ezetimibe  10 mg Oral Daily   feeding supplement (NEPRO CARB STEADY)  237 mL Oral TID BM   ferric citrate  420 mg Oral TID WC   fludrocortisone  0.1 mg Oral Daily   hydrocortisone sod succinate (SOLU-CORTEF) inj  50 mg Intravenous Q6H   insulin aspart  0-5 Units Subcutaneous QHS   insulin aspart  0-6 Units Subcutaneous TID WC   loratadine  10 mg Oral Daily   midodrine  10 mg Oral TID WC   multivitamin  1 tablet Oral QHS   pregabalin  100 mg Oral QHS   pregabalin  50 mg Oral BID   rosuvastatin  10 mg Oral Daily   senna-docusate  1 tablet Oral BID   sodium chloride flush  3 mL Intravenous Q12H   Continuous Infusions:  sodium chloride Stopped (06/10/23 1900)   sodium chloride     anticoagulant sodium citrate     heparin 1,300 Units/hr (06/12/23 0700)   norepinephrine (LEVOPHED) Adult infusion 9 mcg/min (06/12/23 0700)   PRN Meds: sodium chloride, acetaminophen, alteplase, alum & mag hydroxide-simeth, anticoagulant sodium citrate, bisacodyl, docusate sodium, heparin, HYDROcodone-acetaminophen, lidocaine (PF), lidocaine-prilocaine, morphine injection, ondansetron (ZOFRAN) IV, pentafluoroprop-tetrafluoroeth, polyethylene glycol,  sodium chloride flush   Vital Signs    Vitals:   06/12/23 0615 06/12/23 0630 06/12/23 0645 06/12/23 0700  BP:      Pulse: 94 94 90 95  Resp: 16 14 15 13   Temp:      TempSrc:      SpO2: 99% 100% 97% 99%  Weight:      Height:        Intake/Output Summary (Last 24 hours) at 06/12/2023 1052 Last data filed at 06/12/2023 0700 Gross per 24 hour  Intake 1132.56 ml  Output 1200 ml  Net -67.44 ml      06/12/2023    4:13 AM 06/11/2023    8:56 PM 06/11/2023    4:41 PM  Last 3 Weights  Weight (lbs) 266 lb 12.1 oz 249 lb 1.9 oz 250 lb 7.1 oz  Weight (kg) 121 kg 113 kg 113.6 kg      Telemetry    Normal sinus rhythm rate 60-70- Personally Reviewed  ECG    - Personally Reviewed  Physical Exam   Constitutional: Morbid obesity the oriented to person, place, and time. No distress.  HENT:  Head: Grossly normal Eyes:  no discharge. No scleral icterus.  Neck: No JVD, no carotid bruits  Cardiovascular: Regular rate and rhythm, no murmurs appreciated Pulmonary/Chest: Clear to auscultation bilaterally, no wheezes or rails Abdominal: Soft.  no distension.  no tenderness.  Musculoskeletal: Normal range of motion Neurological:  normal muscle tone. Coordination normal. No atrophy Skin: Skin warm  and dry Psychiatric: normal affect, pleasant   Labs    High Sensitivity Troponin:   Recent Labs  Lab 06/11/23 1722 06/11/23 1934 06/12/23 0211  TROPONINIHS 39* 37* 34*     Chemistry Recent Labs  Lab 06/09/23 0350 06/10/23 0445 06/11/23 0454 06/11/23 1236 06/11/23 1237 06/11/23 1722 06/12/23 0211  NA 131* 133* 130*   < > 134* 131* 131*  K 3.8 3.2* 3.7   < > 3.9 3.8 3.3*  CL 91* 94* 95*  --   --  95* 92*  CO2 23 26 24   --   --  24 27  GLUCOSE 219* 175* 184*  --   --  118* 202*  BUN 72* 39* 53*  --   --  46* 29*  CREATININE 9.89* 5.89* 7.69*  --   --  6.22* 4.80*  CALCIUM 7.2* 7.3* 7.0*  --   --  7.1* 7.6*  MG 1.8  --   --   --   --  1.5* 2.0  ALBUMIN  --  3.0* 2.9*  --   --    --  2.9*  GFRNONAA 4* 7* 5*  --   --  6* 9*  ANIONGAP 17* 13 11  --   --  12 12   < > = values in this interval not displayed.    Lipids No results for input(s): "CHOL", "TRIG", "HDL", "LABVLDL", "LDLCALC", "CHOLHDL" in the last 168 hours.  Hematology Recent Labs  Lab 06/11/23 0454 06/11/23 1236 06/11/23 1237 06/11/23 1722 06/12/23 0405  WBC 9.7  --   --  7.8 9.2  RBC 3.39*  --   --  3.30* 3.45*  HGB 10.5*   < > 10.9* 10.2* 10.6*  HCT 33.8*   < > 32.0* 32.6* 34.0*  MCV 99.7  --   --  98.8 98.6  MCH 31.0  --   --  30.9 30.7  MCHC 31.1  --   --  31.3 31.2  RDW 15.3  --   --  15.1 15.0  PLT 114*  --   --  110* 122*   < > = values in this interval not displayed.   Thyroid  Recent Labs  Lab 06/07/23 1254  TSH 1.497  1.491  FREET4 0.95    BNPNo results for input(s): "BNP", "PROBNP" in the last 168 hours.  DDimer No results for input(s): "DDIMER" in the last 168 hours.   Radiology    CARDIAC CATHETERIZATION  Result Date: 06/11/2023 Conclusions: Mild to moderate, non-obstructive coronary artery disease, as detailed below. Widely patent proximal RCA stent. Mild-moderately elevated left heart filling pressures. Severely elevated right heart and pulmonary artery pressures.  Pulmonary artery pressure does not exceed systemic pressure. Mildly reduced cardiac output/index. Recommendations: Continue secondary prevention of coronary artery disease. Gentle fluid removal with hemodialysis as blood pressure allows. Yvonne Kendall, MD [facility]   Cardiac Studies   Echo   1. Left ventricular ejection fraction, by estimation, is 60 to 65%. The  left ventricle has normal function. The left ventricle has no regional  wall motion abnormalities. Left ventricular diastolic parameters are  indeterminate.   2. Right ventricular systolic function is moderately reduced. The right  ventricular size is moderately enlarged. There is severely elevated  pulmonary artery systolic pressure. The  estimated right ventricular  systolic pressure is 79.0 mmHg.   3. Left atrial size was mildly dilated.   4. The mitral valve is normal in structure. Mild mitral valve  regurgitation. No  evidence of mitral stenosis.   5. Tricuspid valve regurgitation is moderate.   6. The aortic valve is normal in structure. Aortic valve regurgitation is  not visualized. Aortic valve sclerosis is present, with no evidence of  aortic valve stenosis.   7. The inferior vena cava is dilated in size with <50% respiratory  variability, suggesting right atrial pressure of 15 mmHg.   Patient Profile     Jacqueline [name] is a 77 y.o. female with a history of CAD status post RCA stenting, end-stage renal disease on hemodialysis, hypertension, hypotension, hyperlipidemia, bradycardia, persistent atrial fibrillation, type 2 diabetes mellitus, peripheral arterial disease, sleep apnea, and obesity who is being seen today for the evaluation of bradycardia/slow A-fib and hypotension   Assessment & Plan    A/P: Hypotension/bradycardia Chronic finding, bradycardia on arrival worse than her usual baseline  Augmented heart rate on nor epi  Seen this admission by EP  She has been noted to have adequate heart rate in the 80s still with hypotension  It was felt she would have little benefit with pacemaker placement  -Plan to continue midodrine 3 times daily, may need extra prior to hemodialysis  Will likely be a difficult wean off the nor epi Options for treatment  limited in the setting of pulmonary hypertension, not a good candidate for sildenafil, would likely not qualify for other pulmonary hypertension medications given end-stage renal disease with dialysis  Permanent atrial fibrillation with slow ventricular rate Noted previously on Zio monitor with rates in the 40s for long periods of time On heparin infusion in place of Eliquis If no AV graft revision needed, could restart Eliquis   Coronary artery disease Prior  non-STEMI 2018 PCI to the RCA On statin, Zetia,  Currently on heparin infusion in place of Eliquis   Hyperlipidemia Continue statin with Zetia   End-stage renal disease On midodrine for blood pressure support HD has been limited secondary to hypotension -Plan for shorter more frequent episodes of dialysis   Type 2 diabetes On insulin    Total encounter time more than 50 minutes  Greater than 50% was spent in counseling and coordination of care with the patient   For questions or updates, please contact [facility] Please consult [email or web address] for contact info under        Signed, Julien Nordmann, MD  06/12/2023, 10:52 AM

## 2023-06-12 NOTE — Consult Note (Signed)
Consultation Note Date: 06/12/2023   Patient Name: Jacqueline [name]  DOB: Jul 15, 1946  MRN: [id number]  Age / Sex: 77 y.o., female  PCP: Macky Lower, MD Referring Physician: Erin Fulling, MD  Reason for Consultation: Establishing goals of care  HPI/Patient Profile: 77 y.o woman with PMHx significant for chronic hypotension (SBP in 80's on Midodrine 10 mg TID) and ESRD on HD (TTS) admitted with Cardiogenic shock due to Bradycardia.   Clinical Assessment and Goals of Care: Notes and labs reviewed.  Spoke with CCM.  Into see patient.  She states that she has 4 children with her first husband.  She states that her first husband, their father just died 2 weeks ago.  She states her second husband died last 29-Apr-2023.  She discusses that she has been living in a facility, and has been bedbound.  She discusses that she has been on dialysis for over a decade now.  She discusses not wanting to be a burden on her children.  We discussed her diagnoses, GOC, and options.  A detailed discussion was had today regarding advanced directives.  Concepts specific to code status, artifical feeding and hydration, IV antibiotics and rehospitalization were discussed.  The difference between an aggressive medical intervention path and a comfort care path was discussed.  Values and goals of care important to patient and family were attempted to be elicited.  Discussed limitations of medical interventions to prolong quality of life in some situations and discussed the concept of human mortality.  She states she will need to consider her wishes as she would not want to live in a prolonged vegetative state, but is unsure if she would want to be put on the ventilator, for short period of time.  She is unsure if she would want to have CPR.  She states she would want 2 of her children [name] and Darren, and her sisters Jasmine December and Chip Boer to be her  surrogate decision makers together.  SUMMARY OF RECOMMENDATIONS   Patient states she will need to consider her wishes regarding ventilator support and CPR.   Palliative team will follow       Primary Diagnoses: Present on Admission:  Symptomatic bradycardia  End stage renal disease (HCC)  Bradycardia  Permanent atrial fibrillation (HCC)   I have reviewed the medical record, interviewed the patient and family, and examined the patient. The following aspects are pertinent.  Past Medical History:  Diagnosis Date   Arthritis    "left knee" (12/04/2016)   CAD (coronary artery disease)    a. 12/2016 NSTEMI/PCI: LM nl, LaD 20p/d, D1 20, OM2 20, OM3 20, RCA 51m (3.0x18 Resolute DES). EF 65%.   Chronic lower back pain    Diverticulosis    on CT   ESRD on dialysis [facility])    GERD (gastroesophageal reflux disease)    Hiatal hernia    History of echocardiogram    a. 09/2022 Echo: EF 60-65%, mild asymm LVH, mod reduced RV fxn, RVSP . Mildly dil RA. Mild AI. Ao sclerosis.  Mild MR.   Hyperlipidemia    Hypertension    a. 12/2016 labetalol d/c'd 2/2 hypotension in HD.   Morbid obesity (HCC)    Obesity    Persistent atrial fibrillation (HCC)    a. 05/2023 Zio: 100% afib avg HR 52 (32-99).   Pneumonia    "couple times" (12/04/2016)   Type II diabetes mellitus (HCC)    Social History   Socioeconomic History   Marital status: Widowed    Spouse name: Not on file   Number of children: 4   Years of education: Not on file   Highest education level: Not on file  Occupational History   Occupation: retired  Tobacco Use   Smoking status: Former    Current packs/day: 0.00    Average packs/day: 1 pack/day for 20.0 years (20.0 ttl pk-yrs)    Types: Cigarettes    Start date: 48    Quit date: 1985    Years since quitting: 39.5   Smokeless tobacco: Never  Vaping Use   Vaping status: Never Used  Substance and Sexual Activity   Alcohol use: No    Alcohol/week: 0.0 standard drinks of  alcohol   Drug use: No   Sexual activity: Not Currently  Other Topics Concern   Not on file  Social History Narrative   Not on file   Social Determinants of Health   Financial Resource Strain: Medium Risk (06/27/2021)   Overall Financial Resource Strain (CARDIA)    Difficulty of Paying Living Expenses: Somewhat hard  Food Insecurity: No Food Insecurity (06/07/2023)   Hunger Vital Sign    Worried About Running Out of Food in the Last Year: Never true    Ran Out of Food in the Last Year: Never true  Transportation Needs: No Transportation Needs (06/07/2023)   PRAPARE - Administrator, Civil Service (Medical): No    Lack of Transportation (Non-Medical): No  Physical Activity: Inactive (06/27/2021)   Exercise Vital Sign    Days of Exercise per Week: 0 days    Minutes of Exercise per Session: 0 min  Stress: No Stress Concern Present (06/27/2021)   [employer] of Occupational Health - Occupational Stress Questionnaire    Feeling of Stress : Not at all  Social Connections: Not on file   Family History  Problem Relation Age of Onset   Diabetes Mother    Peripheral vascular disease Mother        amputation   CAD Father    Heart disease Father    Hypertension Father    Heart attack Father    CAD Sister    Cancer Brother        x 2 brothers, type unknown   Breast cancer Sister    Scheduled Meds:  Chlorhexidine Gluconate Cloth  6 each Topical Daily   cinacalcet  30 mg Oral Q supper   droxidopa  100 mg Oral TID WC   ezetimibe  10 mg Oral Daily   feeding supplement (NEPRO CARB STEADY)  237 mL Oral TID BM   ferric citrate  420 mg Oral TID WC   fludrocortisone  0.1 mg Oral Daily   hydrocortisone sod succinate (SOLU-CORTEF) inj  50 mg Intravenous Q6H   insulin aspart  0-5 Units Subcutaneous QHS   insulin aspart  0-6 Units Subcutaneous TID WC   loratadine  10 mg Oral Daily   midodrine  10 mg Oral TID WC   multivitamin  1 tablet Oral QHS   pregabalin  100 mg Oral QHS    pregabalin  50 mg Oral BID   rosuvastatin  10 mg Oral Daily   senna-docusate  1 tablet Oral BID   sodium chloride flush  3 mL Intravenous Q12H   Continuous Infusions:  sodium chloride Stopped (06/10/23 1900)   sodium chloride     anticoagulant sodium citrate     heparin 1,300 Units/hr (06/12/23 1516)   norepinephrine (LEVOPHED) Adult infusion 7 mcg/min (06/12/23 1516)   PRN Meds:.sodium chloride, acetaminophen, alteplase, alum & mag hydroxide-simeth, anticoagulant sodium citrate, bisacodyl, docusate sodium, heparin, HYDROcodone-acetaminophen, lidocaine (PF), lidocaine-prilocaine, morphine injection, ondansetron (ZOFRAN) IV, mouth rinse, pentafluoroprop-tetrafluoroeth, polyethylene glycol, sodium chloride flush Medications Prior to Admission:  Prior to Admission medications   Medication Sig Start Date End Date Taking? Authorizing Provider  acetaminophen (TYLENOL) 650 MG CR tablet Take 1,300 mg by mouth every 8 (eight) hours as needed for pain.    [provider]  albuterol (VENTOLIN HFA) 108 (90 Base) MCG/ACT inhaler Inhale 1-2 puffs into the lungs every 6 (six) hours as needed for wheezing or shortness of breath. 12/15/21   Edwin Dada P, DO  alteplase (CATHFLO ACTIVASE) 2 MG injection 2 mg by Intracatheter route once as needed for open catheter. 06/09/23   Harlon Ditty D, NP  anticoagulant sodium citrate 4 GM/100ML SOLN 5 mLs by Intracatheter route as needed (in dialysis, If TTP or HIT.). 06/09/23   Judithe Modest, NP  apixaban (ELIQUIS) 5 MG TABS tablet Take 1 tablet (5 mg total) by mouth 2 (two) times daily. 10/02/22   Tresa Moore, MD  B Complex-C-Zn-Folic Acid (DIALYVITE/ZINC) TABS Take 1 tablet by mouth daily.    [provider]  bisacodyl (DULCOLAX) 5 MG EC tablet Take 2 tablets (10 mg total) by mouth daily as needed for moderate constipation. 10/02/22   Tresa Moore, MD  bisacodyl (DULCOLAX) 5 MG EC tablet Take 2 tablets (10 mg total) by mouth daily  as needed for moderate constipation. 06/09/23   Judithe Modest, NP  Chlorhexidine Gluconate Cloth 2 % PADS Apply 6 each topically daily. 06/10/23   Harlon Ditty D, NP  cinacalcet (SENSIPAR) 30 MG tablet Take 30 mg by mouth daily with supper.    [provider]  cinacalcet (SENSIPAR) 30 MG tablet Take 1 tablet (30 mg total) by mouth daily with supper. 06/09/23   Judithe Modest, NP  docusate sodium (COLACE) 100 MG capsule Take 1 capsule (100 mg total) by mouth 2 (two) times daily as needed for mild constipation. 06/09/23   Judithe Modest, NP  ezetimibe (ZETIA) 10 MG tablet Take 1 tablet (10 mg total) by mouth daily. 04/30/22   Iran Ouch, MD  ferric citrate (AURYXIA) 1 GM 210 MG(Fe) tablet Take 420 mg by mouth 3 (three) times daily with meals.    [provider]  ferric citrate (AURYXIA) 1 GM 210 MG(Fe) tablet Take 2 tablets (420 mg total) by mouth 3 (three) times daily with meals. 06/09/23   Judithe Modest, NP  fexofenadine (ALLEGRA) 180 MG tablet Take 180 mg by mouth daily.    [provider]  fludrocortisone (FLORINEF) 0.1 MG tablet Take 1 tablet (0.1 mg total) by mouth daily. 06/10/23   Harlon Ditty D, NP  heparin 1000 unit/mL SOLN injection 1 mL (1,000 Units total) by Intracatheter route as needed (in dialysis). 06/09/23   Judithe Modest, NP  insulin aspart (NOVOLOG) 100 UNIT/ML injection Inject 0-6 Units into the skin every 4 (four)  hours. 06/09/23   Harlon Ditty D, NP  lidocaine, PF, (XYLOCAINE) 1 % SOLN injection Inject 5 mLs into the skin as needed (topical anesthesia for hemodialysis if GEBAUERS is ineffective.). 06/09/23   Judithe Modest, NP  lidocaine-prilocaine (EMLA) cream Apply 1 Application topically as needed (topical anesthesia for hemodialysis if Gebauers and Lidocaine injection are ineffective.). 06/09/23   Judithe Modest, NP  loratadine (CLARITIN) 10 MG tablet Take 1 tablet (10 mg total) by mouth daily. 06/10/23   Judithe Modest, NP   midodrine (PROAMATINE) 10 MG tablet Take 20 mg by mouth daily. (Take additional two tablets [20mg ] for administration at dialysis on Tuesday, Thursday and Saturday)    [provider]  midodrine (PROAMATINE) 10 MG tablet Take 20 mg by mouth daily as needed (for SBP <100).    [provider]  midodrine (PROAMATINE) 10 MG tablet Take 1 tablet (10 mg total) by mouth 3 (three) times daily with meals. 06/09/23   Harlon Ditty D, NP  multivitamin (RENA-VIT) TABS tablet Take 1 tablet by mouth at bedtime. 06/09/23   Harlon Ditty D, NP  norepinephrine (LEVOPHED) 4-5 MG/250ML-% SOLN Inject 2-10 mcg/min into the vein continuous. 06/09/23   Judithe Modest, NP  Nutritional Supplements (FEEDING SUPPLEMENT, NEPRO CARB STEADY,) LIQD Take 237 mLs by mouth 3 (three) times daily between meals. 06/09/23   Judithe Modest, NP  Omega-3 Fatty Acids (FISH OIL) 1000 MG CPDR Take 1,000 mg by mouth daily.    [provider]  omeprazole (PRILOSEC) 20 MG capsule Take 1 capsule (20 mg total) by mouth daily. Patient taking differently: Take 20 mg by mouth 2 (two) times daily. 02/21/22   Arnette Felts, FNP  ondansetron (ZOFRAN) 4 MG/2ML SOLN injection Inject 2 mLs (4 mg total) into the vein every 6 (six) hours as needed for vomiting or nausea. 06/09/23   Judithe Modest, NP  ondansetron (ZOFRAN-ODT) 4 MG disintegrating tablet Take 4 mg by mouth every 8 (eight) hours as needed for nausea or vomiting.    [provider]  pentafluoroprop-tetrafluoroeth Peggye Pitt) AERO Apply 1 Application topically as needed (topical anesthesia for hemodialysis). 06/09/23   Judithe Modest, NP  polyethylene glycol (MIRALAX / GLYCOLAX) 17 g packet Take 17 g by mouth at bedtime.    [provider]  polyethylene glycol (MIRALAX / GLYCOLAX) 17 g packet Take 17 g by mouth daily as needed for moderate constipation. 06/09/23   Judithe Modest, NP  pregabalin (LYRICA) 100 MG capsule Take 100 mg by mouth at bedtime.     [provider]  pregabalin (LYRICA) 100 MG capsule Take 1 capsule (100 mg total) by mouth at bedtime. 06/09/23   Judithe Modest, NP  pregabalin (LYRICA) 50 MG capsule Take 50 mg by mouth 2 (two) times daily.    [provider]  rosuvastatin (CRESTOR) 10 MG tablet Take 1 tablet (10 mg total) by mouth daily. 10/03/22   Tresa Moore, MD  sennosides-docusate sodium (SENOKOT-S) 8.6-50 MG tablet Take 1 tablet by mouth 2 (two) times daily.    [provider]  sodium chloride 0.9 % infusion Inject 250 mLs into the vein continuous. 06/09/23   Judithe Modest, NP  trolamine salicylate (ASPERCREME) 10 % cream Apply 1 Application topically 2 (two) times daily. (Apply to knees)    [provider]   Allergies  Allergen Reactions   Sulfa Antibiotics Other (See Comments)   Sulfa Drugs Cross Reactors Other (See Comments)  Doesn't remember    Review of Systems  All other systems reviewed and are negative.   Physical Exam Pulmonary:     Effort: Pulmonary effort is normal.  Neurological:     Mental Status: She is alert.     Vital Signs: BP (!) 169/50 (BP Location: Other (Comment))   Pulse 93   Temp 97.7 F (36.5 C) (Oral)   Resp 15   Ht 5\' 4"  (1.626 m)   Wt 117.2 kg   SpO2 99%   BMI 44.35 kg/m  Pain Scale: 0-10 POSS *See Group Information*: 1-Acceptable,Awake and alert Pain Score: 8    SpO2: SpO2: 99 % O2 Device:SpO2: 99 % O2 Flow Rate: .O2 Flow Rate (L/min): 2 L/min  IO: Intake/output summary:  Intake/Output Summary (Last 24 hours) at 06/12/2023 1641 Last data filed at 06/12/2023 1516 Gross per 24 hour  Intake 1234.14 ml  Output 2700 ml  Net -1465.86 ml    LBM: Last BM Date : 06/10/23 Baseline Weight: Weight: 114.8 kg Most recent weight: Weight: 117.2 kg        Signed by: Morton Stall, NP   Please contact Palliative Medicine Team phone at [phone] for questions and concerns.  For individual provider: See  Loretha Stapler

## 2023-06-12 NOTE — Progress Notes (Addendum)
NAME:  Jacqueline [name], MRN:  [id number], DOB:  1946/01/03, LOS: 5 ADMISSION DATE:  06/07/2023, CONSULTATION DATE:  06/07/2023 REFERRING MD:  Dr. Allena Katz, CHIEF COMPLAINT:  Hypotension   Brief Pt Description / Synopsis:  77 y.o woman with PMHx significant for chronic hypotension (SBP in 80's on Midodrine 10 mg TID) and ESRD on HD (TTS) admitted with Cardiogenic shock due to Bradycardia.   History of Present Illness:  77 y.o female with significant PMHx significant for HTN, T2DM, ESRD on HD TTS, Chronic debility, CAD s/p stent, PAF, OSA, PAD, Left knee arthritis, Chronic lower back pain, GERD, Obesity, HLD who presented to the ED from Dialysis with generalized weakness x 4 days.   EMS report: NVD and LLQ abdominal pain since last night. States tried to eat yesterday, each time she ate she vomited, then had diarrhea last night. No NVD today. She went to Dialysis today and was noted with low BP of 72/42. She usually runs lows BP and takes Midodrine but today her BP was lower than usually so she was not dialyzed but was sent to ED for further evaluation.   ED Course:  Initial vital signs showed HR of 50 beats/minute, BP 81/51 mm Hg, the RR 16 breaths/minute, and the oxygen saturation 91% on 2L and a temperature of 97.43F (36.6C).   Pertinent Labs/Diagnostics.   Na+/ K+: 133/3.4 Glucose:130 BUN/Cr.:55/7.76 WBC:11.5 Plts: 146 PCT: pending Lactic acid: 1.3 EKG:?Afib vs junctional bradycardia rate 40bmp. Incomplete RBB,   CXR, CT scan obtained and show no signs of infection or other abnormality. Patient given, midodrine, atropine for significant bradycardia and  500 cc of fluids and started on broad-spectrum antibiotics Vanco cefepime and Flagyl for  suspected sepsis of unknown source. Patient remained hypotensive despite  intervention as above. PCCM consulted for possible pressors.  Pertinent Medical History:   Past Medical History:  Diagnosis Date   Arthritis    "left knee" (12/04/2016)   CAD  (coronary artery disease)    a. 12/2016 NSTEMI/PCI: LM nl, LaD 20p/d, D1 20, OM2 20, OM3 20, RCA 40m (3.0x18 Resolute DES). EF 65%.   Chronic lower back pain    Diverticulosis    on CT   ESRD on dialysis [facility])    GERD (gastroesophageal reflux disease)    Hiatal hernia    History of echocardiogram    a. 09/2022 Echo: EF 60-65%, mild asymm LVH, mod reduced RV fxn, RVSP . Mildly dil RA. Mild AI. Ao sclerosis. Mild MR.   Hyperlipidemia    Hypertension    a. 12/2016 labetalol d/c'd 2/2 hypotension in HD.   Morbid obesity (HCC)    Obesity    Persistent atrial fibrillation (HCC)    a. 05/2023 Zio: 100% afib avg HR 52 (32-99).   Pneumonia    "couple times" (12/04/2016)   Type II diabetes mellitus (HCC)    Micro Data:  7/6: Blood culture x2>> no growth to date 7/6: MRSA PCR >> negative  Antimicrobials:   Anti-infectives (From admission, onward)    Start     Dose/Rate Route Frequency Ordered Stop   06/10/23 1200  vancomycin (VANCOCIN) IVPB 1000 mg/200 mL premix  Status:  Discontinued       Placed in "Followed by" Linked Group   1,000 mg 200 mL/hr over 60 Minutes Intravenous Every T-Th-Sa (Hemodialysis) 06/08/23 1058 06/09/23 1029   06/08/23 1145  vancomycin (VANCOCIN) IVPB 1000 mg/200 mL premix       Placed in "Followed by" Linked Group  1,000 mg 200 mL/hr over 60 Minutes Intravenous  Once 06/08/23 1058 06/08/23 1655   06/08/23 1100  piperacillin-tazobactam (ZOSYN) IVPB 2.25 g  Status:  Discontinued        2.25 g 100 mL/hr over 30 Minutes Intravenous Every 8 hours 06/08/23 1058 06/09/23 1029   06/07/23 1400  metroNIDAZOLE (FLAGYL) IVPB 500 mg        500 mg 100 mL/hr over 60 Minutes Intravenous  Once 06/07/23 1346 06/07/23 1652   06/07/23 1400  ceFEPIme (MAXIPIME) 2 g in sodium chloride 0.9 % 100 mL IVPB        2 g 200 mL/hr over 30 Minutes Intravenous  Once 06/07/23 1349 06/07/23 1512   06/07/23 1400  vancomycin (VANCOCIN) IVPB 1000 mg/200 mL premix        1,000 mg 200 mL/hr  over 60 Minutes Intravenous  Once 06/07/23 1349 06/07/23 1652      Significant Hospital Events: Including procedures, antibiotic start and stop dates in addition to other pertinent events   06/07/2023: admit to ICU with bradycardia, started on dopamine 06/08/2023: weaned off dopamine, hypotensive again, dopamine restarted 06/09/2023:  Remains hypotensive requiring Levophed, (pt is vasculopath, doubt accuracy of cuff BP as pt is awake, alert, and asymptomatic).  Attempted Arterial line but unsuccessful, Vascular Surgery consulted to assist with placement. Nephrology consulted, received HD. 06/10/2023: Continues to require Levophed (6 mcg currently), when Levo weaned down does exhibit some Bradycardia in the upper 50's.  Cardiology considering R Heart Cath today. Holding on transfer to [facility] for pacer at this time. 06/11/2023: Levophed weaning down, this AM. LHC/RHC with mild-moderate non-obstructive CAD, patent proximal RCA stent, mild-moderately elevated L heart filling pressures, severely elevated R heart/pulm artery pressures, mildly reduced CO/CI. Episode of chest pressure with HD, resolved with morphine. 06/12/2023: Additional episodes of chest tightness/pressure overnight (0150 and 0554). EKG without changes, trops flat. Hypertensive to 227/80, then with NE off HR dropped to 29.  Interim History / Subjective:  Had a rough night, but feels well overall this morning Overnight, episodes of chest tightness and pressure around 0200 and around 0600 (+L shoulder pain) Hypertensive with SBP > 220, NE turned off, became brady to 29 Remains on NE this morning CP resolved with morphine EKG unchanged, trops flat HD yesterday with 1.2L off  Objective:  Blood pressure (!) 137/40, pulse 95, temperature 97.9 F (36.6 C), temperature source Oral, resp. rate 13, height 5\' 4"  (1.626 m), weight 121 kg, SpO2 99%.        Intake/Output Summary (Last 24 hours) at 06/12/2023 0717 Last data filed at  06/12/2023 0700 Gross per 24 hour  Intake 1287.26 ml  Output 1200 ml  Net 87.26 ml   Filed Weights   06/11/23 1641 06/11/23 2056 06/12/23 0413  Weight: 113.6 kg 113 kg 121 kg   Physical Examination: General: Chronically ill-appearing elderly woman in NAD. Pleasant and conversant, eating breakfast. HEENT: [place]/AT, anicteric sclera, PERRL, dry mucous membranes. Neuro: Awake, oriented x 4. Responds to verbal stimuli. Following commands consistently. Moves all 4 extremities spontaneously. Generalized weakness. CV: Variable HR, irregularly irregular rhythm, no m/g/r. PULM: Breathing even and unlabored on 2LNC. Lung fields CTAB in upper fields, faint bibasilar crackles. GI: Soft, nontender, nondistended. Normoactive bowel sounds. Extremities: No significant LE edema noted. Skin: Warm/dry, no rashes.  Resolved Hospital Problem List:     Assessment & Plan:   #Circulatory Shock: Suspect Cardiogenic given Bradycardia, improving #Afib with slow ventricular rate, improved History of CAD, HLD,  permanent A. Fib, chronic hypotension with SBP in 80's on Midodrine 10 mg TID Echo 7/7 with LVEF 60-65%, indeterminate diastolic parameters, RV systolic function mildly reduced, severe pulmonary hypertension, moderate tricuspid regurgitation. TSH/T4 WNL. LHC/RHC 7/10 with mild-moderate non-obstructive CAD, patent proximal RCA stent, mild-moderately elevated L heart filling pressures, severely elevated R heart/pulm artery pressures, mildly reduced CO/CI. - Goal MAP > 65 - Avoiding fluid resuscitation in the setting of ESRD/HD - Levophed titrated to goal MAP; does best with low doses/not completely off-titrated; becomes bradycardic when NE completely off - Continue midodrine TID, dose increased 7/10, ?supine HTN related to midodine - Consider droxidopa trial - Continue Florinef - Add Solu-Cortef 50mg  Q6H - Cardiac monitoring - Optimized electrolytes for K > 4, Mg > 2 - Heparin gtt for now, transition back  to Eliquis as able - Secondary prevention with Zetia, Crestor - Appreciate Cardiology/EP input, less likely HOHF due to CI 2.4 on cath - Consideration for leadless PM placement on hold - Unlikely to tolerate PH management  #Initial Concern for Sepsis of unknown etiology ~ LOW SUSPICION FOR SEPSIS No clear sign of sepsis though patient is hypotensive. Blood cultures are drawn and are negative to date.  CXR without sign of pneumonia, unable to make urine - Trend WBC, fever curve; leukocytosis improved - Follow Cx data, 7/6 BCx NGTD - Off of antibiotics, low threshold for resumption if WBC uptrends/patient becomes febrile  #ESRD on HD  #Hyponatremia #AG Metabolic Acidosis Outpatient - on HD TThS via LUE AVF); dry weight 115kg. - Nephrology following, appreciate recommendations - HD per Nephro, tolerated 1.2L UF 7/10 - Trend BMP - Replete electrolytes as indicated - Monitor I&Os - Avoid nephrotoxic agents as able - Ensure adequate renal perfusion  #Mild Thrombocytopenia #Anemia of chronic disease in the setting of ESRD - Trend H&H - Monitor for signs of active bleeding while on heparin gtt - Transfuse for Hgb < 7.0 or hemodynamically significant bleeding  #T2DM  - SSI - CBGs AC/HS, now that patient is tolerating diet - Goal CBG 140-180  GOC - Remains FULL CODE status at present - Difficult situation as patient continues to have difficulty tolerating HD with UF, especially in the setting of labile BP/variable pressor requirements; LHC/RHC 7/10 with only mild-moderate CAD, patent RCA stent, elevated R heart/PA pressures. Doubt patient would be able to tolerate PH treatment. No significant CAD to contribute to bradycardia. Less likely HOHF in the setting of CI 2.4.  - PMT consulted, appreciate assistance; will need ongoing GOC discussions if patient unable to tolerate more aggressive interventions  Best Practice (right click and "Reselect all SmartList Selections" daily)    Diet/type: Regular DVT prophylaxis: Heparin gtt GI prophylaxis: N/A Lines: Right radial A-line, and is still needed Foley:  N/A Code Status:  full code Last date of multidisciplinary goals of care discussion [7/11 - patient updated at bedside]  Labs   CBC: Recent Labs  Lab 06/09/23 1238 06/10/23 0445 06/11/23 0454 06/11/23 1236 06/11/23 1237 06/11/23 1722 06/12/23 0405  WBC 11.2* 10.2 9.7  --   --  7.8 9.2  HGB 10.7* 10.3* 10.5* 11.2* 10.9* 10.2* 10.6*  HCT 33.8* 32.8* 33.8* 33.0* 32.0* 32.6* 34.0*  MCV 97.1 97.6 99.7  --   --  98.8 98.6  PLT 120* 114* 114*  --   --  110* 122*  Basic Metabolic Panel: Recent Labs  Lab 06/08/23 0726 06/09/23 0350 06/10/23 0445 06/11/23 0454 06/11/23 1236 06/11/23 1237 06/11/23 1722 06/12/23 0211  NA 133*  131* 133* 130* 133* 134* 131* 131*  K 4.1 3.8 3.2* 3.7 4.1 3.9 3.8 3.3*  CL 92* 91* 94* 95*  --   --  95* 92*  CO2 25 23 26 24   --   --  24 27  GLUCOSE 162* 219* 175* 184*  --   --  118* 202*  BUN 62* 72* 39* 53*  --   --  46* 29*  CREATININE 8.41* 9.89* 5.89* 7.69*  --   --  6.22* 4.80*  CALCIUM 7.7* 7.2* 7.3* 7.0*  --   --  7.1* 7.6*  MG 2.0 1.8  --   --   --   --  1.5* 2.0  PHOS 5.3* 6.0* 3.2 4.3  --   --  3.7 2.6  GFR: Estimated Creatinine Clearance: 12.2 mL/min (A) (by C-G formula based on SCr of 4.8 mg/dL (H)). Recent Labs  Lab 06/07/23 1254 06/08/23 0726 06/09/23 1238 06/09/23 1739 06/10/23 0445 06/11/23 0454 06/11/23 1722 06/12/23 0405  WBC 11.5*   < > 11.2*  --  10.2 9.7 7.8 9.2  LATICACIDVEN 1.3  --  0.7 0.6  --   --   --   --    < > = values in this interval not displayed.  Liver Function Tests: Recent Labs  Lab 06/10/23 0445 06/11/23 0454 06/12/23 0211  ALBUMIN 3.0* 2.9* 2.9*   Recent Labs  Lab 06/07/23 1254  LIPASE 51   No results for input(s): "AMMONIA" in the last 168 hours.  ABG:    Component Value Date/Time   PHART 7.313 (L) 06/11/2023 1237   PCO2ART 48.7 (H) 06/11/2023 1237   PO2ART  90 06/11/2023 1237   HCO3 24.7 06/11/2023 1237   TCO2 26 06/11/2023 1237   ACIDBASEDEF 2.0 06/11/2023 1237   O2SAT 96 06/11/2023 1237   Coagulation Profile: No results for input(s): "INR", "PROTIME" in the last 168 hours.  Cardiac Enzymes: No results for input(s): "CKTOTAL", "CKMB", "CKMBINDEX", "TROPONINI" in the last 168 hours.  HbA1C: Hemoglobin A1C  Date/Time Value Ref Range Status  06/12/2021 12:00 AM 6.6  Final   Hgb A1c MFr Bld  Date/Time Value Ref Range Status  06/07/2023 07:44 PM 5.8 (H) 4.8 - 5.6 % Final    Comment:    (NOTE) Pre diabetes:          5.7%-6.4%  Diabetes:              >6.4%  Glycemic control for   <7.0% adults with diabetes   09/29/2022 04:55 PM 5.2 4.8 - 5.6 % Final    Comment:    (NOTE) Pre diabetes:          5.7%-6.4%  Diabetes:              >6.4%  Glycemic control for   <7.0% adults with diabetes    CBG: Recent Labs  Lab 06/11/23 0821 06/11/23 1609 06/11/23 1921 06/12/23 0009 06/12/23 0325  GLUCAP 134* 93 87 130* 167*   Past Medical History:  She,  has a past medical history of Arthritis, CAD (coronary artery disease), Chronic lower back pain, Diverticulosis, ESRD on dialysis (HCC), GERD (gastroesophageal reflux disease), Hiatal hernia, History of echocardiogram, Hyperlipidemia, Hypertension, Morbid obesity (HCC), Obesity, Persistent atrial fibrillation (HCC), Pneumonia, and Type II diabetes mellitus (HCC).   Surgical History:   Past Surgical History:  Procedure Laterality Date   ABDOMINAL HYSTERECTOMY     AV FISTULA PLACEMENT Left 01/27/2014   Procedure: ARTERIOVENOUS (AV) FISTULA CREATION;  Surgeon:  Pryor Ochoa, MD;  Location: [facility] OR;  Service: Vascular;  Laterality: Left;   AV FISTULA REPAIR     "had it cleaned out"   CARDIAC CATHETERIZATION  ?1980s   CARDIAC CATHETERIZATION N/A 12/04/2016   Procedure: Left Heart Cath Angiography;  Surgeon: Kathleene Hazel, MD;  Location: [facility] INVASIVE CV LAB;  Service: Cardiovascular;   Laterality: N/A;   CARDIAC CATHETERIZATION N/A 12/04/2016   Procedure: Coronary Stent Intervention;  Surgeon: Kathleene Hazel, MD;  Location: [facility] INVASIVE CV LAB;  Service: Cardiovascular;  Laterality: N/A;  Mid RCA   CATARACT EXTRACTION W/ INTRAOCULAR LENS IMPLANT Left    COLONOSCOPY W/ BIOPSIES AND POLYPECTOMY     CORONARY ANGIOPLASTY WITH STENT PLACEMENT  12/04/2016   ESOPHAGOGASTRODUODENOSCOPY     INSERTION OF DIALYSIS CATHETER N/A 01/19/2014   Procedure: INSERTION OF DIALYSIS CATHETER;  Surgeon: Larina Earthly, MD;  Location: [facility] OR;  Service: Vascular;  Laterality: N/A;   RIGHT/LEFT HEART CATH AND CORONARY ANGIOGRAPHY N/A 06/11/2023   Procedure: RIGHT/LEFT HEART CATH AND CORONARY ANGIOGRAPHY;  Surgeon: Yvonne Kendall, MD;  Location: [facility] INVASIVE CV LAB;  Service: Cardiovascular;  Laterality: N/A;    Social History:   reports that she quit smoking about 39 years ago. Her smoking use included cigarettes. She started smoking about 59 years ago. She has a 20 pack-year smoking history. She has never used smokeless tobacco. She reports that she does not drink alcohol and does not use drugs.   Family History:  Her family history includes Breast cancer in her sister; CAD in her father and sister; Cancer in her brother; Diabetes in her mother; Heart attack in her father; Heart disease in her father; Hypertension in her father; Peripheral vascular disease in her mother.   Allergies Allergies  Allergen Reactions   Sulfa Antibiotics Other (See Comments)   Sulfa Drugs Cross Reactors Other (See Comments)    Doesn't remember     Home Medications  Prior to Admission medications   Medication Sig Start Date End Date Taking? Authorizing Provider  acetaminophen (TYLENOL) 650 MG CR tablet Take 1,300 mg by mouth every 8 (eight) hours as needed for pain.    [provider]  albuterol (VENTOLIN HFA) 108 (90 Base) MCG/ACT inhaler Inhale 1-2 puffs into the lungs every 6 (six) hours as needed for  wheezing or shortness of breath. 12/15/21   Franne Forts, DO  apixaban (ELIQUIS) 5 MG TABS tablet Take 1 tablet (5 mg total) by mouth 2 (two) times daily. 10/02/22   Tresa Moore, MD  B Complex-C-Zn-Folic Acid (DIALYVITE/ZINC) TABS Take 1 tablet by mouth daily.    [provider]  bisacodyl (DULCOLAX) 5 MG EC tablet Take 2 tablets (10 mg total) by mouth daily as needed for moderate constipation. 10/02/22   Tresa Moore, MD  cinacalcet (SENSIPAR) 30 MG tablet Take 30 mg by mouth daily with supper.    [provider]  ezetimibe (ZETIA) 10 MG tablet Take 1 tablet (10 mg total) by mouth daily. 04/30/22   Iran Ouch, MD  ferric citrate (AURYXIA) 1 GM 210 MG(Fe) tablet Take 420 mg by mouth 3 (three) times daily with meals.    [provider]  fexofenadine (ALLEGRA) 180 MG tablet Take 180 mg by mouth daily.    [provider]  midodrine (PROAMATINE) 10 MG tablet Take 20 mg by mouth daily. (Take additional two tablets [20mg ] for administration at dialysis on Tuesday, Thursday and Saturday)    [provider]  midodrine (PROAMATINE) 10 MG tablet Take 20 mg by mouth daily as needed (for SBP <100).    [provider]  Omega-3 Fatty Acids (FISH OIL) 1000 MG CPDR Take 1,000 mg by mouth daily.    [provider]  omeprazole (PRILOSEC) 20 MG capsule Take 1 capsule (20 mg total) by mouth daily. Patient taking differently: Take 20 mg by mouth 2 (two) times daily. 02/21/22   Arnette Felts, FNP  ondansetron (ZOFRAN-ODT) 4 MG disintegrating tablet Take 4 mg by mouth every 8 (eight) hours as needed for nausea or vomiting.    [provider]  polyethylene glycol (MIRALAX / GLYCOLAX) 17 g packet Take 17 g by mouth at bedtime.    [provider]  pregabalin (LYRICA) 100 MG capsule Take 100 mg by mouth at bedtime.    [provider]  pregabalin (LYRICA) 50 MG capsule Take 50 mg by mouth 2 (two) times daily.     [provider]  rosuvastatin (CRESTOR) 10 MG tablet Take 1 tablet (10 mg total) by mouth daily. 10/03/22   Tresa Moore, MD  sennosides-docusate sodium (SENOKOT-S) 8.6-50 MG tablet Take 1 tablet by mouth 2 (two) times daily.    [provider]  trolamine salicylate (ASPERCREME) 10 % cream Apply 1 Application topically 2 (two) times daily. (Apply to knees)    [provider]    Critical care time:   The patient is critically ill with multiple organ system failure and requires high complexity decision making for assessment and support, frequent evaluation and titration of therapies, advanced monitoring, review of radiographic studies and interpretation of complex data.   Critical Care Time devoted to patient care services, exclusive of separately billable procedures, described in this note is 36 minutes.  Tim Lair, PA-C [facility] 06/12/23 7:17 AM  Please see [email or web address] for pager details. After hours, please call ELink [phone]

## 2023-06-12 NOTE — Progress Notes (Signed)
Hemodialysis note  Received patient in bed. Alert and oriented.  Informed consent signed and in chart.  Treatment initiated: 1142 Treatment completed: 1445  Patient tolerated well. Alert without acute distress.  Report given to patient's RN.   Access used: LUA AVF Access issues: none  Total UF removed: 1.5 L Medication(s) given:  none  Post HD weight: 117.2 kg   Wolfgang Phoenix [facility]

## 2023-06-12 NOTE — Progress Notes (Addendum)
Patient Name: Jacqueline [name] Date of Encounter: 06/12/2023  Primary Cardiologist: Lorine Bears, MD Electrophysiologist: Sherryl Manges, MD  Interval Summary   NAEON.  Had short HD session yesterday with 1.2L pulled.  Patient had CP yesterday at the end of HD session, relieved with PRN pain med.  She has no complaints this AM. Denies chest pain currently.   R/LHC yesterday shows mild-mod non-obs CAD, severely elevated RH and pulm artery pressures. Pulm artery pressure does not exceed systemic pressure  Inpatient Medications    Scheduled Meds:  Chlorhexidine Gluconate Cloth  6 each Topical Daily   cinacalcet  30 mg Oral Q supper   ezetimibe  10 mg Oral Daily   feeding supplement (NEPRO CARB STEADY)  237 mL Oral TID BM   ferric citrate  420 mg Oral TID WC   fludrocortisone  0.1 mg Oral Daily   insulin aspart  0-6 Units Subcutaneous Q4H   loratadine  10 mg Oral Daily   midodrine  10 mg Oral TID WC   multivitamin  1 tablet Oral QHS   pregabalin  100 mg Oral QHS   pregabalin  50 mg Oral BID   rosuvastatin  10 mg Oral Daily   senna-docusate  1 tablet Oral BID   sodium chloride flush  3 mL Intravenous Q12H   Continuous Infusions:  sodium chloride Stopped (06/10/23 1900)   sodium chloride     anticoagulant sodium citrate     heparin 1,300 Units/hr (06/12/23 0700)   norepinephrine (LEVOPHED) Adult infusion 9 mcg/min (06/12/23 0700)   PRN Meds: sodium chloride, acetaminophen, alteplase, anticoagulant sodium citrate, bisacodyl, docusate sodium, heparin, HYDROcodone-acetaminophen, lidocaine (PF), lidocaine-prilocaine, midodrine, morphine injection, ondansetron (ZOFRAN) IV, pentafluoroprop-tetrafluoroeth, polyethylene glycol, sodium chloride flush   Vital Signs    Vitals:   06/12/23 0615 06/12/23 0630 06/12/23 0645 06/12/23 0700  BP:      Pulse: 94 94 90 95  Resp: 16 14 15 13   Temp:      TempSrc:      SpO2: 99% 100% 97% 99%  Weight:      Height:        Intake/Output  Summary (Last 24 hours) at 06/12/2023 0806 Last data filed at 06/12/2023 0700 Gross per 24 hour  Intake 1132.56 ml  Output 1200 ml  Net -67.44 ml   Filed Weights   06/11/23 1641 06/11/23 2056 06/12/23 0413  Weight: 113.6 kg 113 kg 121 kg    Physical Exam    GEN- The patient is well- appearing, alert and oriented x 3 today.   Lungs- Clear to ausculation bilaterally, normal work of breathing Cardiac- Irregularly irregular rate and rhythm, no murmurs, rubs or gallops GI- soft, NT, ND, + BS Extremities- no clubbing or cyanosis. No edema  Telemetry    Afib/flutter, rates 60-90s; freq PVC (personally reviewed)  Hospital Course    KEELEY SUSSMAN is a 77 y.o. female  with a history of CAD s/p PCI, ESRD on HD, HTN, HypoTN,  bradycardia, persis AFib, OSA admitted for bradycardia, hypotension. EP asked to consult for consideration of PPM  Assessment & Plan    #) bradycardia  #) Hypotension #) pulmHTN #) CAD No AVN blocking agents Continue norepi gtt  RHC yesterday showed pulm artery pressure is greater than systemic HR/BP continue to be discordant Do not favor leadless PPM at this time, given that it will not improve her BP and by extension her ability to tolerate dialysis Agree with palliative care consult to further discuss GOC    #)  Persis AFib (?perm) CHA2DS2-VASc Score = 6 (HTN, T2DM, vasc dz, age x 2, gender)  Restart eliquis when appropriate  #) ESRD Mgmt per primary team     EP will sign off at this time, but remains available. Please re-consult if needed.   For questions or updates, please contact [facility] Please consult [email or web address] for contact info under Cardiology/STEMI.  Signed, Sherie Don, NP  06/12/2023, 8:06 AM   Multiple discussions.  Her bradycardia does not seem to be directly related to her hypotension, as noted previously, the multiple episodes where her heart rate has been in the 60s-80s with blood pressures in the 70s.  Hence, relief  of bradycardia is not going to have a likely bearing on her hypotension which has been limited to dialysis.  At this point would anticipate no device implantation.  We will sign off.  Please call us if we can be of help.  Discussed with CCM and renal this morning

## 2023-06-12 NOTE — Progress Notes (Signed)
ANTICOAGULATION CONSULT NOTE  Pharmacy Consult for IV heparin Indication: atrial fibrillation  Allergies  Allergen Reactions   Sulfa Antibiotics Other (See Comments)   Sulfa Drugs Cross Reactors Other (See Comments)    Doesn't remember     Patient Measurements: Height: 5\' 4"  (162.6 cm) Weight: 118.8 kg (261 lb 14.5 oz) IBW/kg (Calculated) : 54.7 Heparin Dosing Weight: 115.5 kg  Vital Signs: Temp: 97.6 F (36.4 C) (07/11 1111) Temp Source: Oral (07/11 1445) BP: 169/50 (07/11 1445) Pulse Rate: 93 (07/11 1445)  Labs: Recent Labs    06/10/23 0445 06/10/23 1740 06/11/23 0454 06/11/23 1236 06/11/23 1237 06/11/23 1722 06/11/23 1934 06/12/23 0211 06/12/23 0405 06/12/23 0547 06/12/23 1431  HGB 10.3*  --  10.5*   < > 10.9* 10.2*  --   --  10.6*  --   --   HCT 32.8*  --  33.8*   < > 32.0* 32.6*  --   --  34.0*  --   --   PLT 114*  --  114*  --   --  110*  --   --  122*  --   --   APTT  --    < > 167*  --   --   --   --   --   --  61* 87*  HEPARINUNFRC >1.10*  --  >1.10*  --   --   --   --   --   --   --  0.81*  CREATININE 5.89*  --  7.69*  --   --  6.22*  --  4.80*  --   --   --   TROPONINIHS  --   --   --   --   --  39* 37* 34*  --   --   --    < > = values in this interval not displayed.    Estimated Creatinine Clearance: 12 mL/min (A) (by C-G formula based on SCr of 4.8 mg/dL (H)).   Medical History: Past Medical History:  Diagnosis Date   Arthritis    "left knee" (12/04/2016)   CAD (coronary artery disease)    a. 12/2016 NSTEMI/PCI: LM nl, LaD 20p/d, D1 20, OM2 20, OM3 20, RCA 35m (3.0x18 Resolute DES). EF 65%.   Chronic lower back pain    Diverticulosis    on CT   ESRD on dialysis [facility])    GERD (gastroesophageal reflux disease)    Hiatal hernia    History of echocardiogram    a. 09/2022 Echo: EF 60-65%, mild asymm LVH, mod reduced RV fxn, RVSP . Mildly dil RA. Mild AI. Ao sclerosis. Mild MR.   Hyperlipidemia    Hypertension    a. 12/2016 labetalol  d/c'd 2/2 hypotension in HD.   Morbid obesity (HCC)    Obesity    Persistent atrial fibrillation (HCC)    a. 05/2023 Zio: 100% afib avg HR 52 (32-99).   Pneumonia    "couple times" (12/04/2016)   Type II diabetes mellitus (HCC)     Medications:  Eliquis prior to admission (last dose 7/7 @2100 )  Assessment: 77 year old female admitted with circulatory shock, of which has improved. Plan is for PPM, and thus holding Eliquis prior to procedure.   Goal of Therapy:  aPTT 66-102 seconds Monitor platelets by anticoagulation protocol: Yes  07/08 2314 aPTT > 200, Supratherapeutic @ 1800 units/hr 07/09 1750 aPTT > 200, supratherapeutic 1800 > 1400 units/hr 07/11 0547 aPTT 61, subtherapeutic 07/11  Plan: aPTT therapeutic x 1 Continue heparin infusion to 1300 units/hr Recheck aPTT in 8 hours after previous level Heparin level trending toward correlation. Will obtain aPTT for 1 additional day. Consider transitioning from aPTT to heparin levels tomorrow.  HL & CBC daily while on heparin   Elliot Gurney, PharmD, BCPS Clinical Pharmacist  06/12/2023 3:37 PM

## 2023-06-12 NOTE — Progress Notes (Signed)
[facility]  ROUNDING NOTE   Subjective:   Patient did undergo hemodialysis treatment status post right and left heart catheterization yesterday. UF achieved was 1.2 kg. Did have an episode of elevated blood pressure yesterday. Subsequently had short period of bradycardia.  Objective:  Vital signs in last 24 hours:  Temp:  [97.7 F (36.5 C)-98.6 F (37 C)] 97.9 F (36.6 C) (07/11 0315) Pulse Rate:  [0-107] 95 (07/11 0700) Resp:  [7-26] 13 (07/11 0700) BP: (95-153)/(34-60) 137/40 (07/10 2056) SpO2:  [90 %-100 %] 99 % (07/11 0700) Arterial Line BP: (93-227)/(30-80) 129/40 (07/11 0700) Weight:  [409 kg-121 kg] 121 kg (07/11 0413)  Weight change: -4.1 kg Filed Weights   06/11/23 1641 06/11/23 2056 06/12/23 0413  Weight: 113.6 kg 113 kg 121 kg    Intake/Output: I/O last 3 completed shifts: In: 1888 [P.O.:1024; I.V.:814.1; IV Piggyback:50] Out: 1200 [Other:1200]   Intake/Output this shift:  No intake/output data recorded.  Physical Exam: General: NAD  Head: Normocephalic, atraumatic. Moist oral mucosal membranes  Eyes: Anicteric  Lungs:  Clear to auscultation, normal effort  Heart: Regular rate and rhythm  Abdomen:  Soft, nontender,   Extremities: Trace peripheral edema.  Neurologic: Alert and oriented, moving all four extremities  Skin: No lesions  Access: Lt AVF    Basic Metabolic Panel: Recent Labs  Lab 06/08/23 0726 06/09/23 0350 06/10/23 0445 06/11/23 0454 06/11/23 1236 06/11/23 1237 06/11/23 1722 06/12/23 0211  NA 133* 131* 133* 130* 133* 134* 131* 131*  K 4.1 3.8 3.2* 3.7 4.1 3.9 3.8 3.3*  CL 92* 91* 94* 95*  --   --  95* 92*  CO2 25 23 26 24   --   --  24 27  GLUCOSE 162* 219* 175* 184*  --   --  118* 202*  BUN 62* 72* 39* 53*  --   --  46* 29*  CREATININE 8.41* 9.89* 5.89* 7.69*  --   --  6.22* 4.80*  CALCIUM 7.7* 7.2* 7.3* 7.0*  --   --  7.1* 7.6*  MG 2.0 1.8  --   --   --   --  1.5* 2.0  PHOS 5.3* 6.0* 3.2 4.3  --   --  3.7 2.6     Liver Function Tests: Recent Labs  Lab 06/10/23 0445 06/11/23 0454 06/12/23 0211  ALBUMIN 3.0* 2.9* 2.9*   Recent Labs  Lab 06/07/23 1254  LIPASE 51   No results for input(s): "AMMONIA" in the last 168 hours.  CBC: Recent Labs  Lab 06/09/23 1238 06/10/23 0445 06/11/23 0454 06/11/23 1236 06/11/23 1237 06/11/23 1722 06/12/23 0405  WBC 11.2* 10.2 9.7  --   --  7.8 9.2  HGB 10.7* 10.3* 10.5* 11.2* 10.9* 10.2* 10.6*  HCT 33.8* 32.8* 33.8* 33.0* 32.0* 32.6* 34.0*  MCV 97.1 97.6 99.7  --   --  98.8 98.6  PLT 120* 114* 114*  --   --  110* 122*    Cardiac Enzymes: No results for input(s): "CKTOTAL", "CKMB", "CKMBINDEX", "TROPONINI" in the last 168 hours.  BNP: Invalid input(s): "POCBNP"  CBG: Recent Labs  Lab 06/11/23 1609 06/11/23 1921 06/12/23 0009 06/12/23 0325 06/12/23 0735  GLUCAP 93 87 130* 167* 203*    Microbiology: Results for orders placed or performed during the hospital encounter of 06/07/23  Blood culture (routine x 2)     Status: None   Collection Time: 06/07/23 12:54 PM   Specimen: BLOOD  Result Value Ref Range Status   Specimen Description  BLOOD RIGHT ANTECUBITAL  Final   Special Requests   Final    BOTTLES DRAWN AEROBIC AND ANAEROBIC Blood Culture adequate volume   Culture   Final    NO GROWTH 5 DAYS Performed at [facility], [address] [place], [place] [address]    Report Status 06/12/2023 FINAL  Final  Blood culture (routine x 2)     Status: None   Collection Time: 06/07/23 12:54 PM   Specimen: BLOOD  Result Value Ref Range Status   Specimen Description BLOOD BLOOD RIGHT FOREARM  Final   Special Requests   Final    BOTTLES DRAWN AEROBIC AND ANAEROBIC Blood Culture adequate volume   Culture   Final    NO GROWTH 5 DAYS Performed at [facility], [address], [place], [place] [address]    Report Status 06/12/2023 FINAL  Final  MRSA Next Gen by PCR, Nasal     Status: None   Collection Time: 06/07/23  10:18 PM   Specimen: Nasal Mucosa; Nasal Swab  Result Value Ref Range Status   MRSA by PCR Next Gen NOT DETECTED NOT DETECTED Final    Comment: (NOTE) The GeneXpert MRSA Assay (FDA approved for NASAL specimens only), is one component of a comprehensive MRSA colonization surveillance program. It is not intended to diagnose MRSA infection nor to guide or monitor treatment for MRSA infections. Test performance is not FDA approved in patients less than 66 years old. Performed at [facility], [address] [place], [place] [address]     Coagulation Studies: No results for input(s): "LABPROT", "INR" in the last 72 hours.  Urinalysis: No results for input(s): "COLORURINE", "LABSPEC", "PHURINE", "GLUCOSEU", "HGBUR", "BILIRUBINUR", "KETONESUR", "PROTEINUR", "UROBILINOGEN", "NITRITE", "LEUKOCYTESUR" in the last 72 hours.  Invalid input(s): "APPERANCEUR"    Imaging: CARDIAC CATHETERIZATION  Result Date: 06/11/2023 Conclusions: Mild to moderate, non-obstructive coronary artery disease, as detailed below. Widely patent proximal RCA stent. Mild-moderately elevated left heart filling pressures. Severely elevated right heart and pulmonary artery pressures.  Pulmonary artery pressure does not exceed systemic pressure. Mildly reduced cardiac output/index. Recommendations: Continue secondary prevention of coronary artery disease. Gentle fluid removal with hemodialysis as blood pressure allows. Yvonne Kendall, MD [facility]    Medications:    sodium chloride Stopped (06/10/23 1900)   sodium chloride     anticoagulant sodium citrate     heparin 1,300 Units/hr (06/12/23 0700)   norepinephrine (LEVOPHED) Adult infusion 9 mcg/min (06/12/23 0700)    Chlorhexidine Gluconate Cloth  6 each Topical Daily   cinacalcet  30 mg Oral Q supper   ezetimibe  10 mg Oral Daily   feeding supplement (NEPRO CARB STEADY)  237 mL Oral TID BM   ferric citrate  420 mg Oral TID WC   fludrocortisone   0.1 mg Oral Daily   insulin aspart  0-6 Units Subcutaneous Q4H   loratadine  10 mg Oral Daily   midodrine  10 mg Oral TID WC   multivitamin  1 tablet Oral QHS   pregabalin  100 mg Oral QHS   pregabalin  50 mg Oral BID   rosuvastatin  10 mg Oral Daily   senna-docusate  1 tablet Oral BID   sodium chloride flush  3 mL Intravenous Q12H   sodium chloride, acetaminophen, alteplase, anticoagulant sodium citrate, bisacodyl, docusate sodium, heparin, HYDROcodone-acetaminophen, lidocaine (PF), lidocaine-prilocaine, midodrine, morphine injection, ondansetron (ZOFRAN) IV, pentafluoroprop-tetrafluoroeth, polyethylene glycol, sodium chloride flush  Assessment/ Plan:  Jacqueline [name] is a 78 y.o.  female  with a PMHx of ESRD on HD TTS at [facility], diabetes mellitus type 2, chronic hypotension, peripheral arterial disease, obstructive sleep apnea, GERD, obesity, hyperlipidemia, anemia chronic kidney disease, secondary hyperparathyroidism, who was admitted to [facility] on 06/07/2023 for evaluation of generalized weakness.   1  ESRD on HD TTS.  Patient noted to be hypotensive worse than her prior baseline.  Patient was on Levophed earlier in the morning but now off.  Case discussed with critical care.    Patient underwent right and left heart catheterization yesterday.  She was found to have severely elevated right-sided pressures as well as pulmonary hypertension.  Therefore she remains preload dependent.  We will need to perform more frequent dialysis to keep the patient euvolemic.  Also recommend evaluating flow through AV fistula.  We will plan for another dialysis session today.  2.  Hypotension/severe pulmonary hypertension..  Chronic in nature.  Systolic blood pressure usually in the 80s as an outpatient.  She is on midodrine 10 mg 3 times daily as outpatient.  Currently on norepinephrine.  3. Anemia of chronic kidney disease Lab Results  Component Value Date   HGB 10.6 (L) 06/12/2023     Hemoglobin 10.6.  Vella Kohler as an outpatient.  4. Diabetes mellitus type II with chronic kidney disease/renal manifestations: noninsulin dependent.  Most recent hemoglobin A1c is 5.8 on 06/07/23.   5. Secondary Hyperparathyroidism: with outpatient labs: None   Lab Results  Component Value Date   PTH 388.6 (H) 01/17/2014   CALCIUM 7.6 (L) 06/12/2023   CAION 0.92 (L) 06/11/2023   PHOS 2.6 06/12/2023    Continue Cinacalcet and Auryxia.   LOS: 5 Jacqueline [name] 7/11/20247:58 AM

## 2023-06-12 NOTE — Progress Notes (Signed)
ANTICOAGULATION CONSULT NOTE  Pharmacy Consult for IV heparin Indication: atrial fibrillation  Allergies  Allergen Reactions   Sulfa Antibiotics Other (See Comments)   Sulfa Drugs Cross Reactors Other (See Comments)    Doesn't remember     Patient Measurements: Height: 5\' 4"  (162.6 cm) Weight: 121 kg (266 lb 12.1 oz) IBW/kg (Calculated) : 54.7 Heparin Dosing Weight: 115.5 kg  Vital Signs: Temp: 97.9 F (36.6 C) (07/11 0315) Temp Source: Oral (07/11 0315) BP: 137/40 (07/10 2056) Pulse Rate: 94 (07/11 0615)  Labs: Recent Labs    06/10/23 0445 06/10/23 1740 06/11/23 0454 06/11/23 1236 06/11/23 1237 06/11/23 1722 06/11/23 1934 06/12/23 0211 06/12/23 0405 06/12/23 0547  HGB 10.3*  --  10.5*   < > 10.9* 10.2*  --   --  10.6*  --   HCT 32.8*  --  33.8*   < > 32.0* 32.6*  --   --  34.0*  --   PLT 114*  --  114*  --   --  110*  --   --  122*  --   APTT  --  >200* 167*  --   --   --   --   --   --  61*  HEPARINUNFRC >1.10*  --  >1.10*  --   --   --   --   --   --   --   CREATININE 5.89*  --  7.69*  --   --  6.22*  --  4.80*  --   --   TROPONINIHS  --   --   --   --   --  39* 37* 34*  --   --    < > = values in this interval not displayed.    Estimated Creatinine Clearance: 12.2 mL/min (A) (by C-G formula based on SCr of 4.8 mg/dL (H)).   Medical History: Past Medical History:  Diagnosis Date   Arthritis    "left knee" (12/04/2016)   CAD (coronary artery disease)    a. 12/2016 NSTEMI/PCI: LM nl, LaD 20p/d, D1 20, OM2 20, OM3 20, RCA 26m (3.0x18 Resolute DES). EF 65%.   Chronic lower back pain    Diverticulosis    on CT   ESRD on dialysis [facility])    GERD (gastroesophageal reflux disease)    Hiatal hernia    History of echocardiogram    a. 09/2022 Echo: EF 60-65%, mild asymm LVH, mod reduced RV fxn, RVSP . Mildly dil RA. Mild AI. Ao sclerosis. Mild MR.   Hyperlipidemia    Hypertension    a. 12/2016 labetalol d/c'd 2/2 hypotension in HD.   Morbid obesity (HCC)     Obesity    Persistent atrial fibrillation (HCC)    a. 05/2023 Zio: 100% afib avg HR 52 (32-99).   Pneumonia    "couple times" (12/04/2016)   Type II diabetes mellitus (HCC)     Medications:  Eliquis prior to admission (last dose 7/7 @2100 )  Assessment: 77 year old female admitted with circulatory shock, of which has improved. Plan is for PPM, and thus holding Eliquis prior to procedure.   Goal of Therapy:  aPTT 66-102 seconds Monitor platelets by anticoagulation protocol: Yes  07/08 2314 aPTT > 200, Supratherapeutic @ 1800 units/hr 07/09 1750 aPTT > 200, supratherapeutic 1800 > 1400 units/hr 07/11 0547 aPTT 61, subtherapeutic   Plan:  Increase heparin infusion to 1300 units/hr Recheck aPTT in 8 hours after restart HL & CBC daily while on heparin  Otelia Sergeant, PharmD, [facility] 06/12/2023 6:37 AM

## 2023-06-12 NOTE — Plan of Care (Signed)
Completed HD today with out any issues. Still on levo gtt and heparin gtt. Family updated at bedside.

## 2023-06-13 ENCOUNTER — Inpatient Hospital Stay: Payer: Medicare Other

## 2023-06-13 DIAGNOSIS — I272 Pulmonary hypertension, unspecified: Secondary | ICD-10-CM | POA: Diagnosis not present

## 2023-06-13 DIAGNOSIS — I4821 Permanent atrial fibrillation: Secondary | ICD-10-CM | POA: Diagnosis not present

## 2023-06-13 DIAGNOSIS — R079 Chest pain, unspecified: Secondary | ICD-10-CM

## 2023-06-13 LAB — BASIC METABOLIC PANEL
Anion gap: 11 (ref 5–15)
BUN: 33 mg/dL — ABNORMAL HIGH (ref 8–23)
CO2: 27 mmol/L (ref 22–32)
Calcium: 7.7 mg/dL — ABNORMAL LOW (ref 8.9–10.3)
Chloride: 94 mmol/L — ABNORMAL LOW (ref 98–111)
Creatinine, Ser: 4.6 mg/dL — ABNORMAL HIGH (ref 0.44–1.00)
GFR, Estimated: 9 mL/min — ABNORMAL LOW (ref >60)
Glucose, Bld: 243 mg/dL — ABNORMAL HIGH (ref 70–99)
Potassium: 4.1 mmol/L (ref 3.5–5.1)
Sodium: 132 mmol/L — ABNORMAL LOW (ref 135–145)

## 2023-06-13 LAB — CBC
HCT: 32.2 % — ABNORMAL LOW (ref 36.0–46.0)
Hemoglobin: 10.3 g/dL — ABNORMAL LOW (ref 12.0–15.0)
MCH: 31.3 pg (ref 26.0–34.0)
MCHC: 32 g/dL (ref 30.0–36.0)
MCV: 97.9 fL (ref 80.0–100.0)
Platelets: 107 10*3/uL — ABNORMAL LOW (ref 150–400)
RBC: 3.29 MIL/uL — ABNORMAL LOW (ref 3.87–5.11)
RDW: 14.6 % (ref 11.5–15.5)
WBC: 7.8 10*3/uL (ref 4.0–10.5)
nRBC: 0 % (ref 0.0–0.2)

## 2023-06-13 LAB — HEPARIN LEVEL (UNFRACTIONATED)
Heparin Unfractionated: 0.37 IU/mL (ref 0.30–0.70)
Heparin Unfractionated: 0.8 IU/mL — ABNORMAL HIGH (ref 0.30–0.70)

## 2023-06-13 LAB — GLUCOSE, CAPILLARY
Glucose-Capillary: 134 mg/dL — ABNORMAL HIGH (ref 70–99)
Glucose-Capillary: 140 mg/dL — ABNORMAL HIGH (ref 70–99)
Glucose-Capillary: 155 mg/dL — ABNORMAL HIGH (ref 70–99)
Glucose-Capillary: 190 mg/dL — ABNORMAL HIGH (ref 70–99)
Glucose-Capillary: 232 mg/dL — ABNORMAL HIGH (ref 70–99)
Glucose-Capillary: 239 mg/dL — ABNORMAL HIGH (ref 70–99)
Glucose-Capillary: 261 mg/dL — ABNORMAL HIGH (ref 70–99)

## 2023-06-13 LAB — APTT
aPTT: 34 seconds (ref 24–36)
aPTT: 78 seconds — ABNORMAL HIGH (ref 24–36)

## 2023-06-13 LAB — MAGNESIUM: Magnesium: 2.2 mg/dL (ref 1.7–2.4)

## 2023-06-13 LAB — TROPONIN I (HIGH SENSITIVITY)
Troponin I (High Sensitivity): 44 ng/L — ABNORMAL HIGH (ref <18)
Troponin I (High Sensitivity): 43 ng/L — ABNORMAL HIGH (ref <18)

## 2023-06-13 LAB — PHOSPHORUS: Phosphorus: 1.9 mg/dL — ABNORMAL LOW (ref 2.5–4.6)

## 2023-06-13 MED ORDER — FLUDROCORTISONE ACETATE 0.1 MG PO TABS
0.2000 mg | ORAL_TABLET | Freq: Every day | ORAL | Status: DC
  Administered 2023-06-13 – 2023-06-16 (×4): 0.2 mg via ORAL
  Filled 2023-06-13 (×4): qty 2

## 2023-06-13 MED ORDER — POTASSIUM & SODIUM PHOSPHATES 280-160-250 MG PO PACK
2.0000 | PACK | ORAL | Status: AC
  Administered 2023-06-13 (×2): 2 via ORAL
  Filled 2023-06-13 (×2): qty 2

## 2023-06-13 MED ORDER — INSULIN GLARGINE-YFGN 100 UNIT/ML ~~LOC~~ SOLN
5.0000 [IU] | Freq: Every day | SUBCUTANEOUS | Status: DC
  Administered 2023-06-13 – 2023-07-01 (×18): 5 [IU] via SUBCUTANEOUS
  Filled 2023-06-13 (×19): qty 0.05

## 2023-06-13 MED ORDER — NITROGLYCERIN 0.4 MG SL SUBL
SUBLINGUAL_TABLET | SUBLINGUAL | Status: AC
  Administered 2023-06-13: 0.4 mg via SUBLINGUAL
  Filled 2023-06-13: qty 1

## 2023-06-13 MED ORDER — NITROGLYCERIN 0.4 MG SL SUBL: 0.4000 mg | SUBLINGUAL_TABLET | SUBLINGUAL | Status: DC | PRN

## 2023-06-13 MED ORDER — BISACODYL 10 MG RE SUPP
10.0000 mg | Freq: Once | RECTAL | Status: AC
  Administered 2023-06-13: 10 mg via RECTAL
  Filled 2023-06-13: qty 1

## 2023-06-13 MED ORDER — INSULIN ASPART 100 UNIT/ML IJ SOLN
0.0000 [IU] | Freq: Three times a day (TID) | INTRAMUSCULAR | Status: DC
  Administered 2023-06-13: 7 [IU] via SUBCUTANEOUS
  Administered 2023-06-13: 4 [IU] via SUBCUTANEOUS
  Administered 2023-06-13: 11 [IU] via SUBCUTANEOUS
  Administered 2023-06-14 – 2023-06-15 (×3): 4 [IU] via SUBCUTANEOUS
  Administered 2023-06-15 – 2023-06-16 (×5): 3 [IU] via SUBCUTANEOUS
  Administered 2023-06-17 – 2023-06-18 (×3): 4 [IU] via SUBCUTANEOUS
  Administered 2023-06-19 – 2023-06-22 (×5): 3 [IU] via SUBCUTANEOUS
  Administered 2023-06-22: 4 [IU] via SUBCUTANEOUS
  Administered 2023-06-24: 7 [IU] via SUBCUTANEOUS
  Administered 2023-06-25: 3 [IU] via SUBCUTANEOUS
  Administered 2023-06-27: 2 [IU] via SUBCUTANEOUS
  Administered 2023-06-28: 3 [IU] via SUBCUTANEOUS
  Administered 2023-06-29: 4 [IU] via SUBCUTANEOUS
  Administered 2023-06-30 – 2023-07-01 (×2): 3 [IU] via SUBCUTANEOUS
  Filled 2023-06-13 (×25): qty 1

## 2023-06-13 MED ORDER — DROXIDOPA 100 MG PO CAPS
100.0000 mg | ORAL_CAPSULE | Freq: Three times a day (TID) | ORAL | Status: DC
  Administered 2023-06-13 – 2023-06-26 (×37): 100 mg via ORAL
  Filled 2023-06-13 (×41): qty 1

## 2023-06-13 MED ORDER — MIDODRINE HCL 5 MG PO TABS
5.0000 mg | ORAL_TABLET | Freq: Three times a day (TID) | ORAL | Status: DC
  Administered 2023-06-13 (×2): 5 mg via ORAL
  Filled 2023-06-13 (×2): qty 1

## 2023-06-13 MED ORDER — HEPARIN BOLUS VIA INFUSION
3400.0000 [IU] | Freq: Once | INTRAVENOUS | Status: AC
  Administered 2023-06-13: 3400 [IU] via INTRAVENOUS
  Filled 2023-06-13: qty 3400

## 2023-06-13 MED ORDER — MIDODRINE HCL 5 MG PO TABS
15.0000 mg | ORAL_TABLET | Freq: Three times a day (TID) | ORAL | Status: DC
  Administered 2023-06-13: 15 mg via ORAL
  Filled 2023-06-13: qty 3

## 2023-06-13 NOTE — Progress Notes (Signed)
Received patient in bed  Alert and oriented x4 Informed consent signed and in chart. 06/09/23  TX duration:3 hours  Patient tolerated well.  Alert, without acute distress.  Hand-off given to patient's nurse.   Access used: AVF Access issues: none  Total UF removed: 1500 Medication(s) given: none Post HD VS:see table below Post HD weight: 117.9 kg    06/13/23 2334  Vitals  Temp 97.7 F (36.5 C)  Temp Source Oral  BP (!) 121/39  MAP (mmHg) (!) 64  BP Location Right Wrist  BP Method Doppler  Patient Position (if appropriate) Lying  Pulse Rate 63  Pulse Rate Source Monitor  ECG Heart Rate 61  Resp 17  Oxygen Therapy  SpO2 100 %  O2 Device Nasal Cannula  O2 Flow Rate (L/min) 2 L/min  Patient Activity (if Appropriate) In bed  Pulse Oximetry Type Continuous  During Treatment Monitoring  HD Safety Checks Performed Yes  Intra-Hemodialysis Comments Tolerated well  Post Treatment  Dialyzer Clearance Lightly streaked  Duration of HD Treatment -hour(s) 3 hour(s)  Hemodialysis Intake (mL) 0 mL  Liters Processed 54  Fluid Removed (mL) 1500 mL  Tolerated HD Treatment Yes  Post-Hemodialysis Comments goal met  AVG/AVF Arterial Site Held (minutes) 6 minutes  AVG/AVF Venous Site Held (minutes) 4 minutes  Fistula / Graft Left Upper arm  No placement date or time found.   Placed prior to admission: Yes  Orientation: Left  Access Location: Upper arm  Site Condition No complications  Fistula / Graft Assessment Present;Thrill;Bruit  Status Deaccessed;Flushed;Patent       [facility]

## 2023-06-13 NOTE — Progress Notes (Signed)
ANTICOAGULATION CONSULT NOTE  Pharmacy Consult for IV heparin Indication: atrial fibrillation  Allergies  Allergen Reactions   Sulfa Antibiotics Other (See Comments)   Sulfa Drugs Cross Reactors Other (See Comments)    Doesn't remember     Patient Measurements: Height: 5\' 4"  (162.6 cm) Weight: 117.2 kg (258 lb 6.1 oz) IBW/kg (Calculated) : 54.7 Heparin Dosing Weight: 115.5 kg  Vital Signs: Temp: 98.4 F (36.9 C) (07/12 0800) Temp Source: Oral (07/12 0800) Pulse Rate: 61 (07/12 0756)  Labs: Recent Labs    06/11/23 0454 06/11/23 1236 06/11/23 1722 06/11/23 1934 06/12/23 0211 06/12/23 0405 06/12/23 0547 06/12/23 1431 06/13/23 0022 06/13/23 0604 06/13/23 0951  HGB 10.5*   < > 10.2*  --   --  10.6*  --   --   --  10.3*  --   HCT 33.8*   < > 32.6*  --   --  34.0*  --   --   --  32.2*  --   PLT 114*  --  110*  --   --  122*  --   --   --  107*  --   APTT 167*  --   --   --   --   --    < > 87* 34  --  78*  HEPARINUNFRC >1.10*  --   --   --   --   --   --  0.81* 0.37  --   --   CREATININE 7.69*  --  6.22*  --  4.80*  --   --   --   --  4.60*  --   TROPONINIHS  --   --  39* 37* 34*  --   --   --   --   --   --    < > = values in this interval not displayed.    Estimated Creatinine Clearance: 12.5 mL/min (A) (by C-G formula based on SCr of 4.6 mg/dL (H)).   Medical History: Past Medical History:  Diagnosis Date   Arthritis    "left knee" (12/04/2016)   CAD (coronary artery disease)    a. 12/2016 NSTEMI/PCI: LM nl, LaD 20p/d, D1 20, OM2 20, OM3 20, RCA 11m (3.0x18 Resolute DES). EF 65%.   Chronic lower back pain    Diverticulosis    on CT   ESRD on dialysis [facility])    GERD (gastroesophageal reflux disease)    Hiatal hernia    History of echocardiogram    a. 09/2022 Echo: EF 60-65%, mild asymm LVH, mod reduced RV fxn, RVSP . Mildly dil RA. Mild AI. Ao sclerosis. Mild MR.   Hyperlipidemia    Hypertension    a. 12/2016 labetalol d/c'd 2/2 hypotension in HD.    Morbid obesity (HCC)    Obesity    Persistent atrial fibrillation (HCC)    a. 05/2023 Zio: 100% afib avg HR 52 (32-99).   Pneumonia    "couple times" (12/04/2016)   Type II diabetes mellitus (HCC)     Medications:  Eliquis prior to admission (last dose 7/7 @2100 )  Assessment: 77 year old female admitted with circulatory shock, of which has improved. Plan is for PPM, and thus holding Eliquis prior to procedure.   Goal of Therapy:  aPTT 66-102 seconds Monitor platelets by anticoagulation protocol: Yes  07/08 2314 aPTT > 200, Supratherapeutic @ 1800 units/hr 07/09 1750 aPTT > 200, supratherapeutic 1800 > 1400 units/hr 07/11 0547 aPTT 61, subtherapeutic 07/11 0022 aPTT 34, subtherapeutic  07/12 0008 HL 0.37 07/12 0951 aPTT 78   Plan: aPTT therapeutic x 1, HL correlating Continue heparin gtt 1500 units/hr Transition to HL dosing. Next HL 8 hours from previous aPTT. HL & CBC daily while on heparin   Elliot Gurney, PharmD, BCPS Clinical Pharmacist  06/13/2023 10:40 AM

## 2023-06-13 NOTE — Progress Notes (Signed)
  Progress Note    06/13/2023 4:37 PM 2 Days Post-Op  Subjective:  77 y.o woman with PMHx significant for chronic hypotension (SBP in 80's on Midodrine 10 mg TID) and ESRD on HD (TTS) admitted with Cardiogenic shock due to Bradycardia. Patient presented to the ED from dialysis for generalized weakness X 4 days. Patient noted to have persistent hypotension. Vascular Surgery consulted to evaluate dialysis access for venous high volume outflow.    Vitals:   06/13/23 1430 06/13/23 1600  BP:    Pulse: (!) 58 (!) 55  Resp: 17 16  Temp:  97.8 F (36.6 C)  SpO2: 99% 100%   Physical Exam: Cardiac:  RRR, S1, S2 normal Lungs:  Clear to auscultation, normal respiratory effort. No wheezing noted.  Incisions:  NONE Extremities:  Peripheral Edema +1 bilateral lower extremities.  Abdomen:  Positive bowel sounds, Soft and non tender Neurologic: AAOX3 and follows all commands.   CBC    Component Value Date/Time   WBC 7.8 06/13/2023 0604   RBC 3.29 (L) 06/13/2023 0604   HGB 10.3 (L) 06/13/2023 0604   HGB 10.8 (L) 02/25/2022 1709   HCT 32.2 (L) 06/13/2023 0604   HCT 33.6 (L) 02/25/2022 1709   PLT 107 (L) 06/13/2023 0604   PLT 214 02/25/2022 1709   MCV 97.9 06/13/2023 0604   MCV 97 02/25/2022 1709   MCH 31.3 06/13/2023 0604   MCHC 32.0 06/13/2023 0604   RDW 14.6 06/13/2023 0604   RDW 13.2 02/25/2022 1709   LYMPHSABS 1.5 12/26/2022 1355   LYMPHSABS 1.4 02/25/2022 1709   MONOABS 0.8 12/26/2022 1355   EOSABS 0.2 12/26/2022 1355   EOSABS 0.2 02/25/2022 1709   BASOSABS 0.1 12/26/2022 1355   BASOSABS 0.1 02/25/2022 1709    BMET    Component Value Date/Time   NA 132 (L) 06/13/2023 0604   NA 142 10/10/2021 1109   K 4.1 06/13/2023 0604   CL 94 (L) 06/13/2023 0604   CO2 27 06/13/2023 0604   GLUCOSE 243 (H) 06/13/2023 0604   BUN 33 (H) 06/13/2023 0604   BUN 29 (H) 10/10/2021 1109   CREATININE 4.60 (H) 06/13/2023 0604   CALCIUM 7.7 (L) 06/13/2023 0604   GFRNONAA 9 (L) 06/13/2023 0604    GFRAA 5 (L) 06/07/2020 1041    INR    Component Value Date/Time   INR 1.1 06/22/2019 1330     Intake/Output Summary (Last 24 hours) at 06/13/2023 1637 Last data filed at 06/13/2023 1400 Gross per 24 hour  Intake 1241.1 ml  Output 0 ml  Net 1241.1 ml     Assessment/Plan:  77 y.o. female who presented to the ED from dialysis for generalized weakness X 4 days. Patient noted to have persistent hypotension. 2 Days Post-Op   PLAN: Ultrasound of hemodialysis access has been ordered. Waiting results of the study. If the report shows High Flow Output on the venous side of the Dialysis access, Dr Wyn Quaker would be willing band her access next week.  Currently waiting on the Ultrasound findings.   DVT prophylaxis:  Heparin with dialysis   Marcie Bal [facility] 06/13/2023 4:37 PM

## 2023-06-13 NOTE — Progress Notes (Signed)
Rounding Note    Patient Name: Jacqueline [name] Date of Encounter: 06/13/2023  [facility] Cardiologist: Lorine Bears, MD   Subjective   Patient seen on AM rounds. Denies any chest pain or shortness of breath. No family at the bedside this morning during rounds. Remains on norepinephrine. Heart rate remains 50-80 bpm. Blood pressures ranging from 150-90 systolic.   Inpatient Medications    Scheduled Meds:  Chlorhexidine Gluconate Cloth  6 each Topical Daily   cinacalcet  30 mg Oral Q supper   ezetimibe  10 mg Oral Daily   feeding supplement (NEPRO CARB STEADY)  237 mL Oral TID BM   ferric citrate  420 mg Oral TID WC   fludrocortisone  0.2 mg Oral Daily   insulin aspart  0-20 Units Subcutaneous TID WC   insulin aspart  0-5 Units Subcutaneous QHS   loratadine  10 mg Oral Daily   midodrine  15 mg Oral TID WC   multivitamin  1 tablet Oral QHS   pregabalin  100 mg Oral QHS   pregabalin  50 mg Oral BID   rosuvastatin  10 mg Oral Daily   senna-docusate  1 tablet Oral BID   sodium chloride flush  3 mL Intravenous Q12H   Continuous Infusions:  sodium chloride Stopped (06/10/23 1900)   sodium chloride     anticoagulant sodium citrate     heparin 1,500 Units/hr (06/13/23 0136)   norepinephrine (LEVOPHED) Adult infusion 5 mcg/min (06/12/23 2031)   PRN Meds: sodium chloride, acetaminophen, alteplase, alum & mag hydroxide-simeth, anticoagulant sodium citrate, bisacodyl, docusate sodium, heparin, HYDROcodone-acetaminophen, lidocaine (PF), lidocaine-prilocaine, morphine injection, ondansetron (ZOFRAN) IV, mouth rinse, pentafluoroprop-tetrafluoroeth, polyethylene glycol, sodium chloride flush   Vital Signs    Vitals:   06/13/23 0715 06/13/23 0730 06/13/23 0745 06/13/23 0756  BP:      Pulse: 63 (!) 56 (!) 56   Resp:      Temp:    98.4 F (36.9 C)  TempSrc:    Oral  SpO2: 100% 99% 100%   Weight:      Height:        Intake/Output Summary (Last 24 hours) at  06/13/2023 0804 Last data filed at 06/12/2023 2031 Gross per 24 hour  Intake 1307.21 ml  Output 1500 ml  Net -192.79 ml      06/12/2023    3:00 PM 06/12/2023   11:11 AM 06/12/2023    4:13 AM  Last 3 Weights  Weight (lbs) 258 lb 6.1 oz 261 lb 14.5 oz 266 lb 12.1 oz  Weight (kg) 117.2 kg 118.8 kg 121 kg      Telemetry    Rate controlled atrial fibrillation rates 50-80 - Personally Reviewed  ECG    No new tracings - Personally Reviewed  Physical Exam   GEN: No acute distress.  Sitting up in bed watching TV Neck: Unable to determine JVD due to positioning and body habitus Cardiac: IR IR, no murmurs, rubs, or gallops.  Respiratory: Diminished to auscultation bilaterally.Respirations are unlabored at rest on O2 via [place] GI: Soft, nontender, obese,non-distended  MS: No edema; No deformity. AV fistula +bruit/thrill to the LUE Neuro:  Nonfocal  Psych: Normal affect   Labs    High Sensitivity Troponin:   Recent Labs  Lab 06/11/23 1722 06/11/23 1934 06/12/23 0211  TROPONINIHS 39* 37* 34*     Chemistry Recent Labs  Lab 06/10/23 0445 06/11/23 0454 06/11/23 1236 06/11/23 1722 06/12/23 0211 06/13/23 0604  NA 133* 130*   < >  131* 131* 132*  K 3.2* 3.7   < > 3.8 3.3* 4.1  CL 94* 95*  --  95* 92* 94*  CO2 26 24  --  24 27 27   GLUCOSE 175* 184*  --  118* 202* 243*  BUN 39* 53*  --  46* 29* 33*  CREATININE 5.89* 7.69*  --  6.22* 4.80* 4.60*  CALCIUM 7.3* 7.0*  --  7.1* 7.6* 7.7*  MG  --   --   --  1.5* 2.0 2.2  ALBUMIN 3.0* 2.9*  --   --  2.9*  --   GFRNONAA 7* 5*  --  6* 9* 9*  ANIONGAP 13 11  --  12 12 11    < > = values in this interval not displayed.    Lipids No results for input(s): "CHOL", "TRIG", "HDL", "LABVLDL", "LDLCALC", "CHOLHDL" in the last 168 hours.  Hematology Recent Labs  Lab 06/11/23 1722 06/12/23 0405 06/13/23 0604  WBC 7.8 9.2 7.8  RBC 3.30* 3.45* 3.29*  HGB 10.2* 10.6* 10.3*  HCT 32.6* 34.0* 32.2*  MCV 98.8 98.6 97.9  MCH 30.9 30.7 31.3   MCHC 31.3 31.2 32.0  RDW 15.1 15.0 14.6  PLT 110* 122* 107*   Thyroid  Recent Labs  Lab 06/07/23 1254  TSH 1.497  1.491  FREET4 0.95    BNPNo results for input(s): "BNP", "PROBNP" in the last 168 hours.  DDimer No results for input(s): "DDIMER" in the last 168 hours.   Radiology      Cardiac Studies  [facility] 06/11/23 Conclusions: Mild to moderate, non-obstructive coronary artery disease, as detailed below. Widely patent proximal RCA stent. Mild-moderately elevated left heart filling pressures. Severely elevated right heart and pulmonary artery pressures.  Pulmonary artery pressure does not exceed systemic pressure. Mildly reduced cardiac output/index.   Recommendations: Continue secondary prevention of coronary artery disease. Gentle fluid removal with hemodialysis as blood pressure allows.  TTE 06/08/23  1. Left ventricular ejection fraction, by estimation, is 60 to 65%. The  left ventricle has normal function. The left ventricle has no regional  wall motion abnormalities. Left ventricular diastolic parameters are  indeterminate.   2. Right ventricular systolic function is moderately reduced. The right  ventricular size is moderately enlarged. There is severely elevated  pulmonary artery systolic pressure. The estimated right ventricular  systolic pressure is 79.0 mmHg.   3. Left atrial size was mildly dilated.   4. The mitral valve is normal in structure. Mild mitral valve  regurgitation. No evidence of mitral stenosis.   5. Tricuspid valve regurgitation is moderate.   6. The aortic valve is normal in structure. Aortic valve regurgitation is  not visualized. Aortic valve sclerosis is present, with no evidence of  aortic valve stenosis.   7. The inferior vena cava is dilated in size with <50% respiratory  variability, suggesting right atrial pressure of 15 mmHg.   Patient Profile     77 y.o. female with a past medical history of CAD s/p RCA stenting in 2018, ESRD on  HD, hypotension, hyperlipidemia, bradycardia, persistent atrial fibrillation, type 2 diabetes, peripheral arterial disease, sleep apnea, and obesity, has been seen today and evaluated for bradycardia/slow atrial fibrillation and hypotension.  Assessment & Plan    Hypotension/bradycardia -Bradycardia on arrival worse than usual baseline which is a chronic finding -Continued on norepinephrine for hypotension and bradycardia -Was evaluated by EP for possible Micra insertion, no plans for implanted device at this time -Plan to continue midodrine 3 times daily  may need extra prior to hemodialysis -Remains difficult to wean off nor epi -treatment options are limited in the setting of pulmonary hypertension as she is not a good candidate for sildenafil and would likely not qualify for the pulmonary hypertension medications given end-stage renal disease on dialysis  Permanent atrial fibrillation with slow ventricular rate -Noted previously on ZIO monitor with rates in the 40s for long periods of times primarily in the a.m. -On heparin infusion while apixaban remains on hold -Remains on no AV nodal blocking medication -Continue with telemetry monitoring  Coronary artery disease -Prior NSTEMI 2018 with PCI to the RCA -On statin and ezetimibe -Currently on heparin infusion in place of apixaban -Denies any chest discomfort -EKG as needed for pain or changes  Hyperlipidemia -Continued on statin and ezetimibe  End-stage renal disease -Remains on nor epi for blood pressure support -Hemodialysis has been limited secondary to hypotension -Plan for shorter more frequent episodes of dialysis -Management per nephrology -AV fistula graft to be evaluated by vascular for potential revision, if no revision is needed can restart apixaban  Moderate to severe tricuspid regurgitation -Not deemed a good candidate for valve repair or MitraClip given degree of pulmonary hypertension and RV dysfunction  Type 2  diabetes -Continued on insulin -Management per primary team     For questions or updates, please contact [facility] Please consult [email or web address] for contact info under        Signed, May Manrique, NP  06/13/2023, 8:04 AM

## 2023-06-13 NOTE — TOC Progression Note (Addendum)
Transition of Care [facility]) - Progression Note    Patient Details  Name: Jacqueline [name] MRN: [id number] Date of Birth: 09-25-1946  Transition of [facility]) CM/SW Contact  Kreg Shropshire, RN Phone Number: 06/13/2023, 8:36 AM  Clinical Narrative:    Cm continue to assess no needs at this time. Pt is resident at [facility].  1109- Received message from Nurse stating that Tammy from [facility] wanted me to give her a call. Cm called Tammy and stated pt will most likely d/c on the weekend. Tammy stated that [facility] is closed on Sundays.  Expected Discharge Plan: Long Term Nursing Home Barriers to Discharge: Continued Medical Work up  Expected Discharge Plan and Services       Living arrangements for the past 2 months: Skilled Nursing Facility Expected Discharge Date: 06/10/23                                     Social Determinants of Health (SDOH) Interventions SDOH Screenings   Food Insecurity: No Food Insecurity (06/07/2023)  Housing: Low Risk  (06/07/2023)  Transportation Needs: No Transportation Needs (06/07/2023)  Utilities: Not At Risk (06/07/2023)  Depression (PHQ2-9): Low Risk  (06/27/2021)  Financial Resource Strain: Medium Risk (06/27/2021)  Physical Activity: Inactive (06/27/2021)  Stress: No Stress Concern Present (06/27/2021)  Tobacco Use: Medium Risk (06/07/2023)    Readmission Risk Interventions    05/22/2022   12:26 PM  Readmission Risk Prevention Plan  Transportation Screening Complete  PCP or Specialist Appt within 3-5 Days Complete  HRI or Home Care Consult Complete  Social Work Consult for Recovery Care Planning/Counseling Complete  Palliative Care Screening Complete  Medication Review [facility]) Complete

## 2023-06-13 NOTE — Progress Notes (Addendum)
ANTICOAGULATION CONSULT NOTE  Pharmacy Consult for IV heparin Indication: atrial fibrillation  Allergies  Allergen Reactions   Sulfa Antibiotics Other (See Comments)   Sulfa Drugs Cross Reactors Other (See Comments)    Doesn't remember     Patient Measurements: Height: 5\' 4"  (162.6 cm) Weight: 117.2 kg (258 lb 6.1 oz) IBW/kg (Calculated) : 54.7 Heparin Dosing Weight: 115.5 kg  Vital Signs: Temp: 98.1 F (36.7 C) (07/11 1930) Temp Source: Oral (07/11 2000) BP: 94/40 (07/11 1723) Pulse Rate: 59 (07/12 0000)  Labs: Recent Labs    06/11/23 0454 06/11/23 1236 06/11/23 1237 06/11/23 1722 06/11/23 1934 06/12/23 0211 06/12/23 0405 06/12/23 0547 06/12/23 1431 06/13/23 0022  HGB 10.5*   < > 10.9* 10.2*  --   --  10.6*  --   --   --   HCT 33.8*   < > 32.0* 32.6*  --   --  34.0*  --   --   --   PLT 114*  --   --  110*  --   --  122*  --   --   --   APTT 167*  --   --   --   --   --   --  61* 87* 34  HEPARINUNFRC >1.10*  --   --   --   --   --   --   --  0.81* 0.37  CREATININE 7.69*  --   --  6.22*  --  4.80*  --   --   --   --   TROPONINIHS  --   --   --  39* 37* 34*  --   --   --   --    < > = values in this interval not displayed.    Estimated Creatinine Clearance: 12 mL/min (A) (by C-G formula based on SCr of 4.8 mg/dL (H)).   Medical History: Past Medical History:  Diagnosis Date   Arthritis    "left knee" (12/04/2016)   CAD (coronary artery disease)    a. 12/2016 NSTEMI/PCI: LM nl, LaD 20p/d, D1 20, OM2 20, OM3 20, RCA 38m (3.0x18 Resolute DES). EF 65%.   Chronic lower back pain    Diverticulosis    on CT   ESRD on dialysis [facility])    GERD (gastroesophageal reflux disease)    Hiatal hernia    History of echocardiogram    a. 09/2022 Echo: EF 60-65%, mild asymm LVH, mod reduced RV fxn, RVSP . Mildly dil RA. Mild AI. Ao sclerosis. Mild MR.   Hyperlipidemia    Hypertension    a. 12/2016 labetalol d/c'd 2/2 hypotension in HD.   Morbid obesity (HCC)    Obesity     Persistent atrial fibrillation (HCC)    a. 05/2023 Zio: 100% afib avg HR 52 (32-99).   Pneumonia    "couple times" (12/04/2016)   Type II diabetes mellitus (HCC)     Medications:  Eliquis prior to admission (last dose 7/7 @2100 )  Assessment: 77 year old female admitted with circulatory shock, of which has improved. Plan is for PPM, and thus holding Eliquis prior to procedure.   Goal of Therapy:  aPTT 66-102 seconds Monitor platelets by anticoagulation protocol: Yes  07/08 2314 aPTT > 200, Supratherapeutic @ 1800 units/hr 07/09 1750 aPTT > 200, supratherapeutic 1800 > 1400 units/hr 07/11 0547 aPTT 61, subtherapeutic 07/11 0022 aPTT 34, subtherapeutic   Plan: aPTT therapeutic x 1 Contacted RN, no stoppages or line issues Bolus 3400  units x 1 Given pt was previously supratherapeutic at 1400 units, will only increase heparin infusion to 1500 units/hr Recheck aPTT in 8 hours after previous level Heparin level trending toward correlation. Will obtain aPTT for 1 additional day. Consider transitioning from aPTT to heparin levels tomorrow.  HL & CBC daily while on heparin   Elliot Gurney, PharmD, BCPS Clinical Pharmacist  06/13/2023 1:13 AM

## 2023-06-13 NOTE — Inpatient Diabetes Management (Signed)
Inpatient Diabetes Program Recommendations  AACE/ADA: New Consensus Statement on Inpatient Glycemic Control (2015)  Target Ranges:  Prepandial:   less than 140 mg/dL      Peak postprandial:   less than 180 mg/dL (1-2 hours)      Critically ill patients:  140 - 180 mg/dL    Latest Reference Range & Units 06/12/23 07:35 06/12/23 11:56 06/12/23 16:32 06/12/23 19:26  Glucose-Capillary 70 - 99 mg/dL 161 (H) 096 (H) 045 (H) 273 (H)  (H): Data is abnormally high   Latest Reference Range & Units 06/13/23 07:20  Glucose-Capillary 70 - 99 mg/dL 409 (H)  (H): Data is abnormally high     Home DM Meds: None Listed  Current Orders: Novolog Resistant Correction Scale/ SSI (0-20 units) TID AC + HS    Getting Florinef 0.2 mg Daily   MD- Note Novolog SSI increased to 0-20 unit scale this AM  CBG 232 this AM  Please also consider starting Semglee Insulin 5 units Daily (0.05 units/kg)    --Will follow patient during hospitalization--  Ambrose Finland RN, MSN, CDCES Diabetes Coordinator Inpatient Glycemic Control Team Team Pager: [phone] (8a-5p)

## 2023-06-13 NOTE — Progress Notes (Signed)
[facility]  ROUNDING NOTE   Subjective:   Patient did successfully undergo hemodialysis yesterday after right and left heart catheterization. UF achieved was 1.5 kg. We are planning for additional dialysis treatment today.  Objective:  Vital signs in last 24 hours:  Temp:  [98.1 F (36.7 C)-98.4 F (36.9 C)] 98.4 F (36.9 C) (07/12 0800) Pulse Rate:  [54-96] 58 (07/12 1430) Resp:  [12-20] 17 (07/12 1430) BP: (94)/(40) 94/40 (07/11 1723) SpO2:  [91 %-100 %] 99 % (07/12 1430) Arterial Line BP: (97-175)/(29-50) 119/31 (07/12 1430)  Weight change: 5.2 kg Filed Weights   06/12/23 0413 06/12/23 1111 06/12/23 1500  Weight: 121 kg 118.8 kg 117.2 kg    Intake/Output: I/O last 3 completed shifts: In: 2377.1 [P.O.:1314; I.V.:1013.2; IV Piggyback:50] Out: 2700 [Other:2700]   Intake/Output this shift:  Total I/O In: 338.1 [I.V.:338.1] Out: -   Physical Exam: General: NAD  Head: Normocephalic, atraumatic. Moist oral mucosal membranes  Eyes: Anicteric  Lungs:  Clear to auscultation, normal effort  Heart: Regular rate and rhythm  Abdomen:  Soft, nontender,   Extremities: Trace peripheral edema.  Neurologic: Alert and oriented, moving all four extremities  Skin: No lesions  Access: Lt AVF    Basic Metabolic Panel: Recent Labs  Lab 06/08/23 0726 06/09/23 0350 06/10/23 0445 06/11/23 0454 06/11/23 1236 06/11/23 1237 06/11/23 1722 06/12/23 0211 06/13/23 0604  NA 133* 131* 133* 130* 133* 134* 131* 131* 132*  K 4.1 3.8 3.2* 3.7 4.1 3.9 3.8 3.3* 4.1  CL 92* 91* 94* 95*  --   --  95* 92* 94*  CO2 25 23 26 24   --   --  24 27 27   GLUCOSE 162* 219* 175* 184*  --   --  118* 202* 243*  BUN 62* 72* 39* 53*  --   --  46* 29* 33*  CREATININE 8.41* 9.89* 5.89* 7.69*  --   --  6.22* 4.80* 4.60*  CALCIUM 7.7* 7.2* 7.3* 7.0*  --   --  7.1* 7.6* 7.7*  MG 2.0 1.8  --   --   --   --  1.5* 2.0 2.2  PHOS 5.3* 6.0* 3.2 4.3  --   --  3.7 2.6 1.9*    Liver Function  Tests: Recent Labs  Lab 06/10/23 0445 06/11/23 0454 06/12/23 0211  ALBUMIN 3.0* 2.9* 2.9*   Recent Labs  Lab 06/07/23 1254  LIPASE 51   No results for input(s): "AMMONIA" in the last 168 hours.  CBC: Recent Labs  Lab 06/10/23 0445 06/11/23 0454 06/11/23 1236 06/11/23 1237 06/11/23 1722 06/12/23 0405 06/13/23 0604  WBC 10.2 9.7  --   --  7.8 9.2 7.8  HGB 10.3* 10.5* 11.2* 10.9* 10.2* 10.6* 10.3*  HCT 32.8* 33.8* 33.0* 32.0* 32.6* 34.0* 32.2*  MCV 97.6 99.7  --   --  98.8 98.6 97.9  PLT 114* 114*  --   --  110* 122* 107*    Cardiac Enzymes: No results for input(s): "CKTOTAL", "CKMB", "CKMBINDEX", "TROPONINI" in the last 168 hours.  BNP: Invalid input(s): "POCBNP"  CBG: Recent Labs  Lab 06/12/23 1926 06/12/23 2343 06/13/23 0410 06/13/23 0720 06/13/23 1120  GLUCAP 273* 274* 239* 232* 261*    Microbiology: Results for orders placed or performed during the hospital encounter of 06/07/23  Blood culture (routine x 2)     Status: None   Collection Time: 06/07/23 12:54 PM   Specimen: BLOOD  Result Value Ref Range Status   Specimen Description BLOOD  RIGHT ANTECUBITAL  Final   Special Requests   Final    BOTTLES DRAWN AEROBIC AND ANAEROBIC Blood Culture adequate volume   Culture   Final    NO GROWTH 5 DAYS Performed at [facility], [address] [place], [place] [address]    Report Status 06/12/2023 FINAL  Final  Blood culture (routine x 2)     Status: None   Collection Time: 06/07/23 12:54 PM   Specimen: BLOOD  Result Value Ref Range Status   Specimen Description BLOOD BLOOD RIGHT FOREARM  Final   Special Requests   Final    BOTTLES DRAWN AEROBIC AND ANAEROBIC Blood Culture adequate volume   Culture   Final    NO GROWTH 5 DAYS Performed at [facility], [address] [place], [place] [address]    Report Status 06/12/2023 FINAL  Final  MRSA Next Gen by PCR, Nasal     Status: None   Collection Time: 06/07/23 10:18 PM   Specimen:  Nasal Mucosa; Nasal Swab  Result Value Ref Range Status   MRSA by PCR Next Gen NOT DETECTED NOT DETECTED Final    Comment: (NOTE) The GeneXpert MRSA Assay (FDA approved for NASAL specimens only), is one component of a comprehensive MRSA colonization surveillance program. It is not intended to diagnose MRSA infection nor to guide or monitor treatment for MRSA infections. Test performance is not FDA approved in patients less than 9 years old. Performed at [facility], [address] [place], [place] [address]     Coagulation Studies: No results for input(s): "LABPROT", "INR" in the last 72 hours.  Urinalysis: No results for input(s): "COLORURINE", "LABSPEC", "PHURINE", "GLUCOSEU", "HGBUR", "BILIRUBINUR", "KETONESUR", "PROTEINUR", "UROBILINOGEN", "NITRITE", "LEUKOCYTESUR" in the last 72 hours.  Invalid input(s): "APPERANCEUR"    Imaging: CT CHEST WO CONTRAST  Result Date: 06/13/2023 CLINICAL DATA:  Shortness of breath. EXAM: CT CHEST WITHOUT CONTRAST TECHNIQUE: Multidetector CT imaging of the chest was performed following the standard protocol without IV contrast. RADIATION DOSE REDUCTION: This exam was performed according to the departmental dose-optimization program which includes automated exposure control, adjustment of the mA and/or kV according to patient size and/or use of iterative reconstruction technique. COMPARISON:  CTA chest June 20, 23. FINDINGS: Cardiovascular: Cardiomegaly. Coronary artery atherosclerosis. Aortic atherosclerosis. Mediastinum/Nodes: No enlarged mediastinal or axillary lymph nodes. Thyroid gland, trachea, and esophagus demonstrate no significant findings. Lungs/Pleura: Mild dependent atelectasis. Otherwise, clear lungs. No pleural effusions. No pneumothorax. Upper Abdomen: No acute abnormality. Partially imaged exophytic left renal cyst. Left axial via anterior abdominal wall. Musculoskeletal: Multilevel degenerative change.  No acute fracture.  IMPRESSION: 1. Mild dependent atelectasis.  Otherwise, clear lungs. 2. Cardiomegaly. 3. Coronary artery and aortic atherosclerosis (ICD10-I70.0). Electronically Signed   By: Feliberto Harts M.D.   On: 06/13/2023 12:35     Medications:    sodium chloride Stopped (06/10/23 1900)   sodium chloride     anticoagulant sodium citrate     heparin 1,500 Units/hr (06/13/23 1400)   norepinephrine (LEVOPHED) Adult infusion Stopped (06/13/23 1319)    Chlorhexidine Gluconate Cloth  6 each Topical Daily   cinacalcet  30 mg Oral Q supper   droxidopa  100 mg Oral TID WC   ezetimibe  10 mg Oral Daily   feeding supplement (NEPRO CARB STEADY)  237 mL Oral TID BM   ferric citrate  420 mg Oral TID WC   fludrocortisone  0.2 mg Oral Daily   insulin aspart  0-20 Units Subcutaneous TID WC  insulin aspart  0-5 Units Subcutaneous QHS   insulin glargine-yfgn  5 Units Subcutaneous Daily   loratadine  10 mg Oral Daily   midodrine  5 mg Oral TID WC   multivitamin  1 tablet Oral QHS   pregabalin  100 mg Oral QHS   pregabalin  50 mg Oral BID   rosuvastatin  10 mg Oral Daily   senna-docusate  1 tablet Oral BID   sodium chloride flush  3 mL Intravenous Q12H   sodium chloride, acetaminophen, alteplase, alum & mag hydroxide-simeth, anticoagulant sodium citrate, bisacodyl, docusate sodium, heparin, HYDROcodone-acetaminophen, lidocaine (PF), lidocaine-prilocaine, morphine injection, ondansetron (ZOFRAN) IV, mouth rinse, pentafluoroprop-tetrafluoroeth, polyethylene glycol, sodium chloride flush  Assessment/ Plan:  Jacqueline [name] is a 77 y.o.  female  with a PMHx of ESRD on HD TTS at [facility], diabetes mellitus type 2, chronic hypotension, peripheral arterial disease, obstructive sleep apnea, GERD, obesity, hyperlipidemia, anemia chronic kidney disease, secondary hyperparathyroidism, who was admitted to [facility] on 06/07/2023 for evaluation of generalized weakness.   1  ESRD on HD TTS.  Patient noted to be  hypotensive worse than her prior baseline.  Patient was on Levophed earlier in the morning but now off.  Case discussed with critical care.    Patient with elevated right-sided heart pressures and pulmonary hypertension.  The key to dialysis will be frequent dialysis treatments with relatively low UF target each session.  She does appear to tolerate 1.2 to 1.5 L of UF per session relatively well.  We will plan for another session with a similar target today.  Patient will remain preload dependent.  2.  Hypotension/severe pulmonary hypertension..  Chronic in nature.  Systolic blood pressure usually in the 80s as an outpatient.  She is on midodrine 10 mg 3 times daily as outpatient.  Right heart catheterization shows severely elevated right-sided heart pressures and significant pulmonary hypertension.  Norepinephrine stopped for the moment.  Continue to monitor blood pressure very closely.  3. Anemia of chronic kidney disease Lab Results  Component Value Date   HGB 10.3 (L) 06/13/2023    Hemoglobin 10.3.  Continue Mircera as an outpatient.  4. Diabetes mellitus type II with chronic kidney disease/renal manifestations: noninsulin dependent.  Most recent hemoglobin A1c is 5.8 on 06/07/23.   5. Secondary Hyperparathyroidism: with outpatient labs: None   Lab Results  Component Value Date   PTH 388.6 (H) 01/17/2014   CALCIUM 7.7 (L) 06/13/2023   CAION 0.92 (L) 06/11/2023   PHOS 1.9 (L) 06/13/2023    Phosphorus phosphorus and calcium both low at 7.7.  Discontinue Auryxia and Sensipar at this time.   LOS: 6 Jacqueline [name] 7/12/20243:10 PM

## 2023-06-13 NOTE — Progress Notes (Signed)
NAME:  Jacqueline [name], MRN:  [id number], DOB:  26-Dec-1945, LOS: 6 ADMISSION DATE:  06/07/2023, CONSULTATION DATE:  06/07/2023 REFERRING MD:  Dr. Allena Katz, CHIEF COMPLAINT:  Hypotension   Brief Pt Description / Synopsis:  77 y.o woman with PMHx significant for chronic hypotension (SBP in 80's on Midodrine 10 mg TID) and ESRD on HD (TTS) admitted with Cardiogenic shock due to Bradycardia.   History of Present Illness:  77 y.o female with significant PMHx significant for HTN, T2DM, ESRD on HD TTS, Chronic debility, CAD s/p stent, PAF, OSA, PAD, Left knee arthritis, Chronic lower back pain, GERD, Obesity, HLD who presented to the ED from Dialysis with generalized weakness x 4 days.   EMS report: NVD and LLQ abdominal pain since last night. States tried to eat yesterday, each time she ate she vomited, then had diarrhea last night. No NVD today. She went to Dialysis today and was noted with low BP of 72/42. She usually runs lows BP and takes Midodrine but today her BP was lower than usually so she was not dialyzed but was sent to ED for further evaluation.   ED Course:  Initial vital signs showed HR of 50 beats/minute, BP 81/51 mm Hg, the RR 16 breaths/minute, and the oxygen saturation 91% on 2L and a temperature of 97.69F (36.6C).   Pertinent Labs/Diagnostics.   Na+/ K+: 133/3.4 Glucose:130 BUN/Cr.:55/7.76 WBC:11.5 Plts: 146 PCT: pending Lactic acid: 1.3 EKG:?Afib vs junctional bradycardia rate 40bmp. Incomplete RBB,   CXR, CT scan obtained and show no signs of infection or other abnormality. Patient given, midodrine, atropine for significant bradycardia and  500 cc of fluids and started on broad-spectrum antibiotics Vanco cefepime and Flagyl for  suspected sepsis of unknown source. Patient remained hypotensive despite  intervention as above. PCCM consulted for possible pressors.  Pertinent Medical History:   Past Medical History:  Diagnosis Date   Arthritis    "left knee" (12/04/2016)   CAD  (coronary artery disease)    a. 12/2016 NSTEMI/PCI: LM nl, LaD 20p/d, D1 20, OM2 20, OM3 20, RCA 1m (3.0x18 Resolute DES). EF 65%.   Chronic lower back pain    Diverticulosis    on CT   ESRD on dialysis [facility])    GERD (gastroesophageal reflux disease)    Hiatal hernia    History of echocardiogram    a. 09/2022 Echo: EF 60-65%, mild asymm LVH, mod reduced RV fxn, RVSP . Mildly dil RA. Mild AI. Ao sclerosis. Mild MR.   Hyperlipidemia    Hypertension    a. 12/2016 labetalol d/c'd 2/2 hypotension in HD.   Morbid obesity (HCC)    Obesity    Persistent atrial fibrillation (HCC)    a. 05/2023 Zio: 100% afib avg HR 52 (32-99).   Pneumonia    "couple times" (12/04/2016)   Type II diabetes mellitus (HCC)    Micro Data:  7/6: Blood culture x2>> negative 7/6: MRSA PCR >> negative  Antimicrobials:   Anti-infectives (From admission, onward)    Start     Dose/Rate Route Frequency Ordered Stop   06/10/23 1200  vancomycin (VANCOCIN) IVPB 1000 mg/200 mL premix  Status:  Discontinued       Placed in "Followed by" Linked Group   1,000 mg 200 mL/hr over 60 Minutes Intravenous Every T-Th-Sa (Hemodialysis) 06/08/23 1058 06/09/23 1029   06/08/23 1145  vancomycin (VANCOCIN) IVPB 1000 mg/200 mL premix       Placed in "Followed by" Linked Group   1,000 mg 200  mL/hr over 60 Minutes Intravenous  Once 06/08/23 1058 06/08/23 1655   06/08/23 1100  piperacillin-tazobactam (ZOSYN) IVPB 2.25 g  Status:  Discontinued        2.25 g 100 mL/hr over 30 Minutes Intravenous Every 8 hours 06/08/23 1058 06/09/23 1029   06/07/23 1400  metroNIDAZOLE (FLAGYL) IVPB 500 mg        500 mg 100 mL/hr over 60 Minutes Intravenous  Once 06/07/23 1346 06/07/23 1652   06/07/23 1400  ceFEPIme (MAXIPIME) 2 g in sodium chloride 0.9 % 100 mL IVPB        2 g 200 mL/hr over 30 Minutes Intravenous  Once 06/07/23 1349 06/07/23 1512   06/07/23 1400  vancomycin (VANCOCIN) IVPB 1000 mg/200 mL premix        1,000 mg 200 mL/hr over 60  Minutes Intravenous  Once 06/07/23 1349 06/07/23 1652      Significant Hospital Events: Including procedures, antibiotic start and stop dates in addition to other pertinent events   06/07/2023: admit to ICU with bradycardia, started on dopamine 06/08/2023: weaned off dopamine, hypotensive again, dopamine restarted 06/09/2023:  Remains hypotensive requiring Levophed, (pt is vasculopath, doubt accuracy of cuff BP as pt is awake, alert, and asymptomatic).  Attempted Arterial line but unsuccessful, Vascular Surgery consulted to assist with placement. Nephrology consulted, received HD. 06/10/2023: Continues to require Levophed (6 mcg currently), when Levo weaned down does exhibit some Bradycardia in the upper 50's.  Cardiology considering R Heart Cath today. Holding on transfer to [facility] for pacer at this time. 06/11/2023: Levophed weaning down, this AM. LHC/RHC with mild-moderate non-obstructive CAD, patent proximal RCA stent, mild-moderately elevated L heart filling pressures, severely elevated R heart/pulm artery pressures, mildly reduced CO/CI. Episode of chest pressure with HD, resolved with morphine. 06/12/2023: Additional episodes of chest tightness/pressure overnight (0150 and 0554). EKG without changes, trops flat. Hypertensive to 227/80, then with NE off HR dropped to 29.  Droxidopa was eventually stopped. 06/13/2023: No events overnight, no chest pain or hypertension. Remains on Levophed.  Decrease Midodrine to 5 mg TID and restart Droxidopa, increase Florinef and stop Solu-cortef in attempts to wean Levophed off. Obtain CT Chest to rule out Pulmonary pathology for etiology of PH.  Interim History / Subjective:  -No significant events noted overnight ~ no complaints of chest pain or severe hypertension -Pt is awake and alert, no complaints this am -Afebrile, remains on Levophed (currently 2 mcg) ~ will decrease Midodrine and restart Droxidopa, and increase Florinef in hopes of weaning off  Levophed -Discussed with Dr Arville Lime, will obtain CT Chest to rule out lung pathology as cause of PH   Objective:  Blood pressure (!) 94/40, pulse (!) 58, temperature 98.1 F (36.7 C), temperature source Oral, resp. rate 15, height 5\' 4"  (1.626 m), weight 117.2 kg, SpO2 100%.        Intake/Output Summary (Last 24 hours) at 06/13/2023 0727 Last data filed at 06/12/2023 2031 Gross per 24 hour  Intake 1307.21 ml  Output 1500 ml  Net -192.79 ml   Filed Weights   06/12/23 0413 06/12/23 1111 06/12/23 1500  Weight: 121 kg 118.8 kg 117.2 kg   Physical Examination: General: Chronically ill-appearing elderly woman in NAD. Pleasant and conversant, eating breakfast. HEENT: [place]/AT, anicteric sclera, PERRL, dry mucous membranes. Neuro: Awake, oriented x 4. Responds to verbal stimuli. Following commands consistently. Moves all 4 extremities spontaneously. Generalized weakness. CV: Variable HR, irregularly irregular rhythm, no m/g/r. PULM: Breathing even and unlabored on 2LNC. Lung  fields CTAB in upper fields, faint bibasilar crackles. GI: Soft, nontender, nondistended. Normoactive bowel sounds. Extremities: No significant LE edema noted. Skin: Warm/dry, no rashes.  Resolved Hospital Problem List:     Assessment & Plan:   #Circulatory Shock: Suspect Cardiogenic given Bradycardia, improving #Afib with slow ventricular rate, improved #Pulmonary Hypertension #Moderate Tricuspid Regurgitation History of CAD, HLD, permanent A. Fib, chronic hypotension with SBP in 80's on Midodrine 10 mg TID Echo 7/7 with LVEF 60-65%, indeterminate diastolic parameters, RV systolic function mildly reduced, severe pulmonary hypertension, moderate tricuspid regurgitation. TSH/T4 WNL.  LHC/RHC 7/10 with mild-moderate non-obstructive CAD, patent proximal RCA stent, mild-moderately elevated L heart filling pressures, severely elevated R heart/pulm artery pressures, mildly reduced CO/CI. - Goal MAP > 65 - Avoiding fluid  resuscitation in the setting of ESRD/HD - Levophed titrated to goal MAP; does best with low doses/not completely off-titrated; becomes bradycardic when NE completely off - Decrease midodrine to 5 mg TID and restart Droxidopa;  ?supine HTN related to midodine - Increase Florinef to 0.2, will dc Solu-Cortef - Cardiac monitoring - Optimized electrolytes for K > 4, Mg > 2 - Heparin gtt for now, transition back to Eliquis as able - Secondary prevention with Zetia, Crestor - Appreciate Cardiology/EP input, less likely HOHF due to CI 2.4 on cath - Consideration for leadless PM placement on hold - Deemed not a good candiate for valve repair or MitraClip given degree of PH and RV dysfunction - Unlikely to tolerate PH management - Will obtain CT Chest to rule out Pulmonary etiology of PH  #Initial Concern for Sepsis of unknown etiology ~ LOW SUSPICION FOR SEPSIS No clear sign of sepsis though patient is hypotensive. Blood cultures are drawn and are negative to date.  CXR without sign of pneumonia, unable to make urine - Trend WBC, fever curve; leukocytosis improved - Follow Cx data, 7/6 BCx NGTD - Off of antibiotics, low threshold for resumption if WBC uptrends/patient becomes febrile  #ESRD on HD  #Hyponatremia #AG Metabolic Acidosis Outpatient - on HD TThS via LUE AVF); dry weight 115kg. 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 Doubt patient would be able to tolerate PH treatment. No significant CAD to contribute to bradycardia. Less likely HOHF in the setting of CI 2.4.  - PMT consulted, appreciate assistance; will need ongoing GOC discussions if patient unable to tolerate more aggressive interventions   Best Practice (right click and "Reselect all SmartList Selections" daily)   Diet/type: Regular DVT prophylaxis: Heparin gtt GI prophylaxis: N/A Lines: Right radial A-line, and is still needed Foley:  N/A Code Status:  full code Last date of multidisciplinary goals of care discussion [7/12 - patient updated at bedside]  Labs   CBC: Recent Labs  Lab 06/10/23 0445 06/11/23 0454 06/11/23 1236 06/11/23 1237 06/11/23 1722 06/12/23 0405 06/13/23  0604  WBC 10.2 9.7  --   --  7.8 9.2 7.8  HGB 10.3* 10.5* 11.2* 10.9* 10.2* 10.6* 10.3*  HCT 32.8* 33.8* 33.0* 32.0* 32.6* 34.0* 32.2*  MCV 97.6 99.7  --   --  98.8 98.6 97.9  PLT 114* 114*  --   --  110* 122* 107*  Basic Metabolic Panel: Recent Labs  Lab 06/08/23 0726 06/09/23 0350 06/10/23 0445 06/11/23 0454 06/11/23 1236 06/11/23 1237 06/11/23 1722 06/12/23 0211 06/13/23 0604  NA 133* 131* 133* 130* 133* 134* 131* 131* 132*  K 4.1 3.8 3.2* 3.7 4.1 3.9 3.8 3.3* 4.1  CL 92* 91* 94* 95*  --   --  95* 92* 94*  CO2 25 23 26 24   --   --  24 27 27   GLUCOSE 162* 219* 175* 184*  --   --  118* 202* 243*  BUN 62* 72* 39* 53*  --   --  46* 29* 33*  CREATININE 8.41* 9.89* 5.89* 7.69*  --   --  6.22* 4.80* 4.60*  CALCIUM 7.7* 7.2* 7.3* 7.0*  --   --  7.1* 7.6* 7.7*  MG 2.0 1.8  --   --   --   --  1.5* 2.0 2.2  PHOS 5.3* 6.0* 3.2 4.3  --   --  3.7 2.6 1.9*  GFR: Estimated Creatinine Clearance: 12.5 mL/min (A) (by C-G formula based on SCr of 4.6 mg/dL (H)). Recent Labs  Lab  06/07/23 1254 06/08/23 0726 06/09/23 1238 06/09/23 1739 06/10/23 0445 06/11/23 0454 06/11/23 1722 06/12/23 0405 06/13/23 0604  WBC 11.5*   < > 11.2*  --    < > 9.7 7.8 9.2 7.8  LATICACIDVEN 1.3  --  0.7 0.6  --   --   --   --   --    < > = values in this interval not displayed.  Liver Function Tests: Recent Labs  Lab 06/10/23 0445 06/11/23 0454 06/12/23 0211  ALBUMIN 3.0* 2.9* 2.9*   Recent Labs  Lab 06/07/23 1254  LIPASE 51   No results for input(s): "AMMONIA" in the last 168 hours.  ABG:    Component Value Date/Time   PHART 7.313 (L) 06/11/2023 1237   PCO2ART 48.7 (H) 06/11/2023 1237   PO2ART 90 06/11/2023 1237   HCO3 24.7 06/11/2023 1237   TCO2 26 06/11/2023 1237   ACIDBASEDEF 2.0 06/11/2023 1237   O2SAT 96 06/11/2023 1237   Coagulation Profile: No results for input(s): "INR", "PROTIME" in the last 168 hours.  Cardiac Enzymes: No results for input(s): "CKTOTAL", "CKMB", "CKMBINDEX", "TROPONINI" in the last 168 hours.  HbA1C: Hemoglobin A1C  Date/Time Value Ref Range Status  06/12/2021 12:00 AM 6.6  Final   Hgb A1c MFr Bld  Date/Time Value Ref Range Status  06/07/2023 07:44 PM 5.8 (H) 4.8 - 5.6 % Final    Comment:    (NOTE) Pre diabetes:          5.7%-6.4%  Diabetes:              >6.4%  Glycemic control for   <7.0% adults with diabetes   09/29/2022 04:55 PM 5.2 4.8 - 5.6 % Final    Comment:    (NOTE) Pre diabetes:          5.7%-6.4%  Diabetes:              >6.4%  Glycemic control for   <7.0% adults with diabetes    CBG: Recent Labs  Lab  06/12/23 1632 06/12/23 1926 06/12/23 2343 06/13/23 0410 06/13/23 0720  GLUCAP 209* 273* 274* 239* 232*   Past Medical History:  She,  has a past medical history of Arthritis, CAD (coronary artery disease), Chronic lower back pain, Diverticulosis, ESRD on dialysis (HCC), GERD (gastroesophageal reflux disease), Hiatal hernia, History of echocardiogram, Hyperlipidemia, Hypertension, Morbid obesity  (HCC), Obesity, Persistent atrial fibrillation (HCC), Pneumonia, and Type II diabetes mellitus (HCC).   Surgical History:   Past Surgical History:  Procedure Laterality Date   ABDOMINAL HYSTERECTOMY     AV FISTULA PLACEMENT Left 01/27/2014   Procedure: ARTERIOVENOUS (AV) FISTULA CREATION;  Surgeon: Pryor Ochoa, MD;  Location: [facility] OR;  Service: Vascular;  Laterality: Left;   AV FISTULA REPAIR     "had it cleaned out"   CARDIAC CATHETERIZATION  ?1980s   CARDIAC CATHETERIZATION N/A 12/04/2016   Procedure: Left Heart Cath Angiography;  Surgeon: Kathleene Hazel, MD;  Location: [facility] INVASIVE CV LAB;  Service: Cardiovascular;  Laterality: N/A;   CARDIAC CATHETERIZATION N/A 12/04/2016   Procedure: Coronary Stent Intervention;  Surgeon: Kathleene Hazel, MD;  Location: [facility] INVASIVE CV LAB;  Service: Cardiovascular;  Laterality: N/A;  Mid RCA   CATARACT EXTRACTION W/ INTRAOCULAR LENS IMPLANT Left    COLONOSCOPY W/ BIOPSIES AND POLYPECTOMY     CORONARY ANGIOPLASTY WITH STENT PLACEMENT  12/04/2016   ESOPHAGOGASTRODUODENOSCOPY     INSERTION OF DIALYSIS CATHETER N/A 01/19/2014   Procedure: INSERTION OF DIALYSIS CATHETER;  Surgeon: Larina Earthly, MD;  Location: [facility] OR;  Service: Vascular;  Laterality: N/A;   RIGHT/LEFT HEART CATH AND CORONARY ANGIOGRAPHY N/A 06/11/2023   Procedure: RIGHT/LEFT HEART CATH AND CORONARY ANGIOGRAPHY;  Surgeon: Yvonne Kendall, MD;  Location: [facility] INVASIVE CV LAB;  Service: Cardiovascular;  Laterality: N/A;    Social History:   reports that she quit smoking about 39 years ago. Her smoking use included cigarettes. She started smoking about 59 years ago. She has a 20 pack-year smoking history. She has never used smokeless tobacco. She reports that she does not drink alcohol and does not use drugs.   Family History:  Her family history includes Breast cancer in her sister; CAD in her father and sister; Cancer in her brother; Diabetes in her mother; Heart attack in her  father; Heart disease in her father; Hypertension in her father; Peripheral vascular disease in her mother.   Allergies Allergies  Allergen Reactions   Sulfa Antibiotics Other (See Comments)   Sulfa Drugs Cross Reactors Other (See Comments)    Doesn't remember     Home Medications  Prior to Admission medications   Medication Sig Start Date End Date Taking? Authorizing Provider  acetaminophen (TYLENOL) 650 MG CR tablet Take 1,300 mg by mouth every 8 (eight) hours as needed for pain.    [provider]  albuterol (VENTOLIN HFA) 108 (90 Base) MCG/ACT inhaler Inhale 1-2 puffs into the lungs every 6 (six) hours as needed for wheezing or shortness of breath. 12/15/21   Franne Forts, DO  apixaban (ELIQUIS) 5 MG TABS tablet Take 1 tablet (5 mg total) by mouth 2 (two) times daily. 10/02/22   Tresa Moore, MD  B Complex-C-Zn-Folic Acid (DIALYVITE/ZINC) TABS Take 1 tablet by mouth daily.    [provider]  bisacodyl (DULCOLAX) 5 MG EC tablet Take 2 tablets (10 mg total) by mouth daily as needed for moderate constipation. 10/02/22   Tresa Moore, MD  cinacalcet (SENSIPAR) 30 MG tablet Take 30 mg by mouth  daily with supper.    [provider]  ezetimibe (ZETIA) 10 MG tablet Take 1 tablet (10 mg total) by mouth daily. 04/30/22   Iran Ouch, MD  ferric citrate (AURYXIA) 1 GM 210 MG(Fe) tablet Take 420 mg by mouth 3 (three) times daily with meals.    [provider]  fexofenadine (ALLEGRA) 180 MG tablet Take 180 mg by mouth daily.    [provider]  midodrine (PROAMATINE) 10 MG tablet Take 20 mg by mouth daily. (Take additional two tablets [20mg ] for administration at dialysis on Tuesday, Thursday and Saturday)    [provider]  midodrine (PROAMATINE) 10 MG tablet Take 20 mg by mouth daily as needed (for SBP <100).    [provider]  Omega-3 Fatty Acids (FISH OIL) 1000 MG CPDR Take 1,000 mg by mouth daily.    [provider]  omeprazole (PRILOSEC) 20 MG capsule Take 1 capsule (20 mg total) by mouth daily. Patient taking differently: Take 20 mg by mouth 2 (two) times daily. 02/21/22   Arnette Felts, FNP  ondansetron (ZOFRAN-ODT) 4 MG disintegrating tablet Take 4 mg by mouth every 8 (eight) hours as needed for nausea or vomiting.    [provider]  polyethylene glycol (MIRALAX / GLYCOLAX) 17 g packet Take 17 g by mouth at bedtime.    [provider]  pregabalin (LYRICA) 100 MG capsule Take 100 mg by mouth at bedtime.    [provider]  pregabalin (LYRICA) 50 MG capsule Take 50 mg by mouth 2 (two) times daily.    [provider]  rosuvastatin (CRESTOR) 10 MG tablet Take 1 tablet (10 mg total) by mouth daily. 10/03/22   Tresa Moore, MD  sennosides-docusate sodium (SENOKOT-S) 8.6-50 MG tablet Take 1 tablet by mouth 2 (two) times daily.    [provider]  trolamine salicylate (ASPERCREME) 10 % cream Apply 1 Application topically 2 (two) times daily. (Apply to knees)    [provider]    Critical care time:   The patient is critically ill with multiple organ system failure and requires high complexity decision making for assessment and support, frequent evaluation and titration of therapies, advanced monitoring, review of radiographic studies and interpretation of complex data.   Critical Care Time devoted to patient care services, exclusive of separately billable procedures, described in this note is 40 minutes.  Judithe Modest, NP [facility] 06/13/23 7:27 AM  Please see [email or web address] for pager details. After hours, please call ELink [phone]

## 2023-06-13 NOTE — Progress Notes (Signed)
Pt complaining of chest pain radiating to her back. PRN morphine and sublingual  nitroglycerin given. Per MD get stat troponin and 12 lead EKG.

## 2023-06-13 NOTE — Progress Notes (Signed)
ANTICOAGULATION CONSULT NOTE  Pharmacy Consult for IV heparin Indication: atrial fibrillation  Allergies  Allergen Reactions   Sulfa Antibiotics Other (See Comments)   Sulfa Drugs Cross Reactors Other (See Comments)    Doesn't remember     Patient Measurements: Height: 5\' 4"  (162.6 cm) Weight: 117.2 kg (258 lb 6.1 oz) IBW/kg (Calculated) : 54.7 Heparin Dosing Weight: 115.5 kg  Vital Signs: Temp: 97.8 F (36.6 C) (07/12 1600) Temp Source: Oral (07/12 1600) Pulse Rate: 55 (07/12 1600)  Labs: Recent Labs    06/11/23 1722 06/11/23 1934 06/12/23 0211 06/12/23 0405 06/12/23 0547 06/12/23 1431 06/13/23 0022 06/13/23 0604 06/13/23 0951 06/13/23 1729  HGB 10.2*  --   --  10.6*  --   --   --  10.3*  --   --   HCT 32.6*  --   --  34.0*  --   --   --  32.2*  --   --   PLT 110*  --   --  122*  --   --   --  107*  --   --   APTT  --   --   --   --    < > 87* 34  --  78*  --   HEPARINUNFRC  --   --   --   --   --  0.81* 0.37  --   --  0.80*  CREATININE 6.22*  --  4.80*  --   --   --   --  4.60*  --   --   TROPONINIHS 39* 37* 34*  --   --   --   --   --   --  44*   < > = values in this interval not displayed.    Estimated Creatinine Clearance: 12.5 mL/min (A) (by C-G formula based on SCr of 4.6 mg/dL (H)).   Medical History: Past Medical History:  Diagnosis Date   Arthritis    "left knee" (12/04/2016)   CAD (coronary artery disease)    a. 12/2016 NSTEMI/PCI: LM nl, LaD 20p/d, D1 20, OM2 20, OM3 20, RCA 35m (3.0x18 Resolute DES). EF 65%.   Chronic lower back pain    Diverticulosis    on CT   ESRD on dialysis [facility])    GERD (gastroesophageal reflux disease)    Hiatal hernia    History of echocardiogram    a. 09/2022 Echo: EF 60-65%, mild asymm LVH, mod reduced RV fxn, RVSP . Mildly dil RA. Mild AI. Ao sclerosis. Mild MR.   Hyperlipidemia    Hypertension    a. 12/2016 labetalol d/c'd 2/2 hypotension in HD.   Morbid obesity (HCC)    Obesity    Persistent atrial  fibrillation (HCC)    a. 05/2023 Zio: 100% afib avg HR 52 (32-99).   Pneumonia    "couple times" (12/04/2016)   Type II diabetes mellitus (HCC)     Medications:  Eliquis prior to admission (last dose 7/7 @2100 )  Assessment: 77 year old female admitted with circulatory shock, of which has improved. Plan is for PPM, and thus holding Eliquis prior to procedure.   Goal of Therapy:  HL 0.3-0.7 aPTT 66-102 seconds Monitor platelets by anticoagulation protocol: Yes  07/08 2314 aPTT > 200, Supratherapeutic @ 1800 units/hr 07/09 1750 aPTT > 200, supratherapeutic 1800 > 1400 units/hr 07/11 0547 aPTT 61, subtherapeutic 07/11 0022 aPTT 34, subtherapeutic 07/12 0008 HL 0.37 07/12 0951 aPTT 78 07/12 1729 HL 0.80,  Supratherapeutic  1500>1400    Plan:  07/12 1729 HL 0.80,  Supratherapeutic decrease heparin gtt from 1500 to 1400 units/hr Check Next HL 8 hours from rate change HL & CBC daily while on heparin  Bari Mantis PharmD Clinical Pharmacist 06/13/2023

## 2023-06-13 NOTE — Consult Note (Signed)
PHARMACY CONSULT NOTE - FOLLOW UP  Pharmacy Consult for Electrolyte Monitoring and Replacement   Recent Labs: Potassium (mmol/L)  Date Value  06/13/2023 4.1   Magnesium (mg/dL)  Date Value  16/09/9603 2.2   Calcium (mg/dL)  Date Value  54/08/8118 7.7 (L)   Albumin (g/dL)  Date Value  14/78/2956 2.9 (L)  10/10/2021 4.1   Phosphorus (mg/dL)  Date Value  21/30/8657 1.9 (L)   Sodium (mmol/L)  Date Value  06/13/2023 132 (L)  10/10/2021 142     Assessment: 77 y.o female with significant PMH of HTN, T2DM,ESRD on HD TTS, Chronic debility , CAD s/p stent, PAF,  OSA, PAD, Left knee arthritis, Chronic lower back pain,GERD,Obesity , HLD  who presented to the ED  from Dialysis with chief complaints of generalized weakness x 4 days.   Goal of Therapy:  Within normal limits  Plan:  Phos 1.9. Phos-Nak 2 packets every 4 hours x 2 doses. F/u with AM labs.   Elliot Gurney, PharmD, BCPS Clinical Pharmacist  06/13/2023 8:15 AM

## 2023-06-13 NOTE — Plan of Care (Signed)

## 2023-06-14 DIAGNOSIS — I272 Pulmonary hypertension, unspecified: Secondary | ICD-10-CM | POA: Diagnosis not present

## 2023-06-14 DIAGNOSIS — R001 Bradycardia, unspecified: Secondary | ICD-10-CM | POA: Diagnosis not present

## 2023-06-14 DIAGNOSIS — N186 End stage renal disease: Secondary | ICD-10-CM | POA: Diagnosis not present

## 2023-06-14 DIAGNOSIS — I4821 Permanent atrial fibrillation: Secondary | ICD-10-CM | POA: Diagnosis not present

## 2023-06-14 LAB — GLUCOSE, CAPILLARY
Glucose-Capillary: 145 mg/dL — ABNORMAL HIGH (ref 70–99)
Glucose-Capillary: 151 mg/dL — ABNORMAL HIGH (ref 70–99)
Glucose-Capillary: 157 mg/dL — ABNORMAL HIGH (ref 70–99)
Glucose-Capillary: 84 mg/dL (ref 70–99)
Glucose-Capillary: 137 mg/dL — ABNORMAL HIGH (ref 70–99)
Glucose-Capillary: 167 mg/dL — ABNORMAL HIGH (ref 70–99)
Glucose-Capillary: 189 mg/dL — ABNORMAL HIGH (ref 70–99)

## 2023-06-14 LAB — CBC
HCT: 31.9 % — ABNORMAL LOW (ref 36.0–46.0)
Hemoglobin: 10.1 g/dL — ABNORMAL LOW (ref 12.0–15.0)
MCH: 31.2 pg (ref 26.0–34.0)
MCHC: 31.7 g/dL (ref 30.0–36.0)
MCV: 98.5 fL (ref 80.0–100.0)
Platelets: 140 10*3/uL — ABNORMAL LOW (ref 150–400)
RBC: 3.24 MIL/uL — ABNORMAL LOW (ref 3.87–5.11)
RDW: 15 % (ref 11.5–15.5)
WBC: 12.9 10*3/uL — ABNORMAL HIGH (ref 4.0–10.5)
nRBC: 0 % (ref 0.0–0.2)

## 2023-06-14 LAB — RENAL FUNCTION PANEL
Albumin: 2.9 g/dL — ABNORMAL LOW (ref 3.5–5.0)
Anion gap: 11 (ref 5–15)
BUN: 56 mg/dL — ABNORMAL HIGH (ref 8–23)
CO2: 26 mmol/L (ref 22–32)
Calcium: 7.8 mg/dL — ABNORMAL LOW (ref 8.9–10.3)
Chloride: 94 mmol/L — ABNORMAL LOW (ref 98–111)
Creatinine, Ser: 5.56 mg/dL — ABNORMAL HIGH (ref 0.44–1.00)
GFR, Estimated: 7 mL/min — ABNORMAL LOW (ref >60)
Glucose, Bld: 167 mg/dL — ABNORMAL HIGH (ref 70–99)
Phosphorus: 2.2 mg/dL — ABNORMAL LOW (ref 2.5–4.6)
Potassium: 4.6 mmol/L (ref 3.5–5.1)
Sodium: 131 mmol/L — ABNORMAL LOW (ref 135–145)

## 2023-06-14 LAB — HEPARIN LEVEL (UNFRACTIONATED)
Heparin Unfractionated: 0.84 IU/mL — ABNORMAL HIGH (ref 0.30–0.70)
Heparin Unfractionated: 0.71 IU/mL — ABNORMAL HIGH (ref 0.30–0.70)
Heparin Unfractionated: 0.56 IU/mL (ref 0.30–0.70)

## 2023-06-14 LAB — LIPOPROTEIN A (LPA): Lipoprotein (a): 246.5 nmol/L — ABNORMAL HIGH (ref <75.0)

## 2023-06-14 MED ORDER — GUAIFENESIN-DM 100-10 MG/5ML PO SYRP
15.0000 mL | ORAL_SOLUTION | ORAL | Status: DC | PRN
  Administered 2023-06-14 – 2023-06-18 (×4): 15 mL via ORAL
  Filled 2023-06-14 (×4): qty 20

## 2023-06-14 MED ORDER — MIDODRINE HCL 5 MG PO TABS
10.0000 mg | ORAL_TABLET | Freq: Three times a day (TID) | ORAL | Status: DC
  Administered 2023-06-14 – 2023-06-16 (×9): 10 mg via ORAL
  Filled 2023-06-14 (×9): qty 2

## 2023-06-14 NOTE — Progress Notes (Addendum)
Blood pressure up and down as well as Levophed up and down. Heart rate has been 45-72 but mostly A.Fib. rate in the 50s. Dialysis done today. Sisters and son in to visit. Patient has slept off and on all day. She states her dialysis last night did not end until midnight, so she was tired. Reported she gets up with help at [facility]. She sits up all day in the wheelchair she reports. 31 Visiting with family. Dialysis continues.

## 2023-06-14 NOTE — Progress Notes (Signed)
ANTICOAGULATION CONSULT NOTE  Pharmacy Consult for IV heparin Indication: atrial fibrillation  Allergies  Allergen Reactions   Sulfa Antibiotics Other (See Comments)   Sulfa Drugs Cross Reactors Other (See Comments)    Doesn't remember     Patient Measurements: Height: 5\' 4"  (162.6 cm) Weight: 118 kg (260 lb 2.3 oz) IBW/kg (Calculated) : 54.7 Heparin Dosing Weight: 115.5 kg  Vital Signs: Temp: 97.7 F (36.5 C) (07/13 0245) Temp Source: Oral (07/13 0245) BP: 121/39 (07/12 2334) Pulse Rate: 62 (07/13 0600)  Labs: Recent Labs    06/11/23 1722 06/11/23 1934 06/12/23 0211 06/12/23 0405 06/12/23 0547 06/12/23 1431 06/13/23 0022 06/13/23 0604 06/13/23 0951 06/13/23 1729 06/13/23 1923 06/14/23 0345 06/14/23 0405  HGB 10.2*  --   --  10.6*  --   --   --  10.3*  --   --   --   --   --   HCT 32.6*  --   --  34.0*  --   --   --  32.2*  --   --   --   --   --   PLT 110*  --   --  122*  --   --   --  107*  --   --   --   --   --   APTT  --   --   --   --    < > 87* 34  --  78*  --   --   --   --   HEPARINUNFRC  --   --   --   --    < > 0.81* 0.37  --   --  0.80*  --  0.84*  --   CREATININE 6.22*  --  4.80*  --   --   --   --  4.60*  --   --   --   --  5.56*  TROPONINIHS 39*   < > 34*  --   --   --   --   --   --  44* 43*  --   --    < > = values in this interval not displayed.    Estimated Creatinine Clearance: 10.4 mL/min (A) (by C-G formula based on SCr of 5.56 mg/dL (H)).   Medical History: Past Medical History:  Diagnosis Date   Arthritis    "left knee" (12/04/2016)   CAD (coronary artery disease)    a. 12/2016 NSTEMI/PCI: LM nl, LaD 20p/d, D1 20, OM2 20, OM3 20, RCA 4m (3.0x18 Resolute DES). EF 65%.   Chronic lower back pain    Diverticulosis    on CT   ESRD on dialysis [facility])    GERD (gastroesophageal reflux disease)    Hiatal hernia    History of echocardiogram    a. 09/2022 Echo: EF 60-65%, mild asymm LVH, mod reduced RV fxn, RVSP . Mildly dil RA. Mild  AI. Ao sclerosis. Mild MR.   Hyperlipidemia    Hypertension    a. 12/2016 labetalol d/c'd 2/2 hypotension in HD.   Morbid obesity (HCC)    Obesity    Persistent atrial fibrillation (HCC)    a. 05/2023 Zio: 100% afib avg HR 52 (32-99).   Pneumonia    "couple times" (12/04/2016)   Type II diabetes mellitus (HCC)     Medications:  Eliquis prior to admission (last dose 7/7 @2100 )  Assessment: 77 year old female admitted with circulatory shock, of which has improved. Plan is  for PPM, and thus holding Eliquis prior to procedure.   Goal of Therapy:  anti-Xa 0.3-0.7 Monitor platelets by anticoagulation protocol: Yes    Plan: anti-Xa level borderline therapeutic following recent rate decrease, with trend down (into target zone) continue heparin infusion at 1250 units/hr Check next anti-Xa 8 in hours to confirm anti-Xa & CBC at least once daily while on heparin  Burnis Medin, PharmD, BCPS 06/14/2023 7:32 AM

## 2023-06-14 NOTE — Progress Notes (Signed)
Patient Name: Jacqueline [name] Date of Encounter: 06/14/2023 [facility] Cardiologist: Lorine Bears, MD   Interval Summary  .    Noted that she felt lightheaded earlier when her diastolic blood pressure was in the 20s.  She has not been out of bed.  Otherwise without complaint.  Vital Signs .    Vitals:   06/14/23 0300 06/14/23 0400 06/14/23 0500 06/14/23 0600  BP:      Pulse: 61 64 60 62  Resp: 15 14 15 14   Temp:      TempSrc:      SpO2: 100% 100% 100% 100%  Weight:   118 kg   Height:        Intake/Output Summary (Last 24 hours) at 06/14/2023 0948 Last data filed at 06/14/2023 0357 Gross per 24 hour  Intake 364.05 ml  Output 1500 ml  Net -1135.95 ml      06/14/2023    5:00 AM 06/13/2023   11:34 PM 06/13/2023    6:09 PM  Last 3 Weights  Weight (lbs) 260 lb 2.3 oz 259 lb 14.8 oz 260 lb 5.8 oz  Weight (kg) 118 kg 117.9 kg 118.1 kg      Telemetry/ECG    Sinus bradycardia.  Heart rate ranging from the 40s to the 60s.- Personally Reviewed  Physical Exam .    VS:  BP (!) 71/38   Pulse (!) 58   Temp 97.7 F (36.5 C) (Oral)   Resp 12   Ht 5\' 4"  (1.626 m)   Wt 118 kg   SpO2 98%   BMI 44.65 kg/m  , BMI Body mass index is 44.65 kg/m. GENERAL: Chronically ill-appearing.  No acute distress. HEENT: Pupils equal round and reactive, fundi not visualized, oral mucosa unremarkable NECK:  No jugular venous distention, waveform within normal limits, carotid upstroke brisk and symmetric, no bruits, no thyromegaly LUNGS:  Clear to auscultation bilaterally HEART: Bradycardic.  Regular rhythm.  PMI not displaced or sustained,S1 and S2 within normal limits, no S3, no S4, no clicks, no rubs, no murmurs ABD:  Flat, positive bowel sounds normal in frequency in pitch, no bruits, no rebound, no guarding, no midline pulsatile mass, no hepatomegaly, no splenomegaly EXT:  2 plus pulses throughout, no edema, no cyanosis no clubbing SKIN:  No rashes no nodules NEURO:  Cranial  nerves II through XII grossly intact, motor grossly intact throughout PSYCH:  Cognitively intact, oriented to person place and time   Assessment & Plan .     # Bradycardia: # Hypotension: She continues to have intermittent bradycardia.  Heart rate is mostly in the 60s.  Agree with Dr. Okey Dupre that it is not clear that bradycardia is causing her hypotension or symptoms.  No plans for pacemaker at this time.  She has been evaluated by EP.  She is on midodrine, fludrocortisone and droxidopa.  Continue blood pressure management by nephrology and primary team. Low diastolic BP.  Concern for low SVR 2/2 fistula and shunting. Howevre, on ultrasound 06/13/23, flow was found to be in acceptable range.  She remains on Levophed.  Not thought to be due to sepsis.  # CAD s/p RCA PCI: # Hyperlipidemia: Cath this admission showed patent RCA stent and mild-mod non-obstructive CAD.  Severely elevated R heart and pulmonary pressures.  Continue rosuvastatin and Zetia.  # Severe pulmonary HTN: LV function is normal and RV moderately reduced on echo.  RHC demonstrated severely elevated pulmonary pressure as below.  Volume management challenging given her  hypotension.  Volume is managed by nephrology and dialysis.   RA (mean): 21 mmHg RV (S/EDP): 78/18 mmHg PA (S/D, mean): 73/30 (44) mmHg PCWP (mean): 30 mmHg with prominent v-waves (versus incomplete wedging)  Ao sat: 96% PA sat: 60%  Fick CO: 5.3 L/min Fick CI: 2.4 L/min/m^2  Thermodilution CO: 4.3 L/min Thermodilution CI: 2.0 L/min/m^2   # ESRD on HD: Per nephrology  # Persistent atrial fibrillation:  Bradycardic as above.  Therefore not on any nodal agents.  She remains on IV heparin.  She is on Eliquis at baseline.  # Mod-severe TR:  Not a good candidate for repair given RV dysfuction and pulmonary HTN.  # OSA: Patient denies diagnosis of OSA and reports that she does not use a CPAP at home.  Given her severe pulmonary hypertension and pending  palliative care discussions this may need further outpatient investigation.  Total critical care time: 35 minutes. Critical care time was exclusive of separately billable procedures and treating other patients. Critical care was necessary to treat or prevent imminent or life-threatening deterioration. Critical care was time spent personally by me on the following activities: development of treatment plan with patient and/or surrogate as well as nursing, discussions with consultants, evaluation of patient's response to treatment, examination of patient, obtaining history from patient or surrogate, ordering and performing treatments and interventions, ordering and review of laboratory studies, ordering and review of radiographic studies, pulse oximetry and re-evaluation of patient's condition.   For questions or updates, please contact [facility] Please consult [email or web address] for contact info under        Signed, Chilton Si, MD

## 2023-06-14 NOTE — Progress Notes (Signed)
Visited with [name] at her bedside. She complains of feeling "woozy" and shares she is not interested in speaking with me at this time. When asked if I should return later today she responded, "probably not." Will attempt GOC conversations at a later time. Complaints shared with nursing staff.  No Charge.  Leeanne Deed, DNP, AGNP-C Palliative Medicine  Please call Palliative Medicine team phone with any questions [phone]. For individual providers please see AMION.

## 2023-06-14 NOTE — Consult Note (Signed)
PHARMACY CONSULT NOTE  Pharmacy Consult for Electrolyte Monitoring and Replacement   Recent Labs: Potassium (mmol/L)  Date Value  06/14/2023 4.6   Magnesium (mg/dL)  Date Value  82/95/6213 2.2   Calcium (mg/dL)  Date Value  08/65/7846 7.8 (L)   Albumin (g/dL)  Date Value  96/29/5284 2.9 (L)  10/10/2021 4.1   Phosphorus (mg/dL)  Date Value  13/24/4010 2.2 (L)   Sodium (mmol/L)  Date Value  06/14/2023 131 (L)  10/10/2021 142     Assessment: 77 y.o female with significant PMH of HTN, T2DM,ESRD on HD TTS, Chronic debility , CAD s/p stent, PAF,  OSA, PAD, Left knee arthritis, Chronic lower back pain,GERD,Obesity , HLD  who presented to the ED  from Dialysis with chief complaints of generalized weakness x 4 days.   Goal of Therapy:  electrolytes within normal limits  Plan:  --no electrolyte replacement warranted for today ---f/u with AM labs.    Clinical Pharmacist  06/14/2023 7:31 AM

## 2023-06-14 NOTE — Procedures (Signed)
Received patient in bed to unit.  Alert and oriented.  Informed consent signed and in chart.   TX duration:3.5 hrs  Patient tolerated well.  TAlert, without acute distress.  Hand-off given to patient's nurse.   Access used: Left upper arm fistula.  Access issues: NONE  Total UF removed: Medication(s) given: NONE    [facility]

## 2023-06-14 NOTE — Progress Notes (Signed)
ANTICOAGULATION CONSULT NOTE  Pharmacy Consult for IV heparin Indication: atrial fibrillation  Allergies  Allergen Reactions   Sulfa Antibiotics Other (See Comments)   Sulfa Drugs Cross Reactors Other (See Comments)    Doesn't remember     Patient Measurements: Height: 5\' 4"  (162.6 cm) Weight: 116.5 kg (256 lb 13.4 oz) IBW/kg (Calculated) : 54.7 Heparin Dosing Weight: 115.5 kg  Vital Signs: Temp: 99.1 F (37.3 C) (07/13 2000) Temp Source: Oral (07/13 2000) BP: 119/31 (07/13 1914) Pulse Rate: 68 (07/13 1914)  Labs: Recent Labs     0000 06/12/23 0211 06/12/23 0405 06/12/23 0547 06/12/23 1431 06/13/23 0022 06/13/23 0604 06/13/23 0951 06/13/23 1729 06/13/23 1923 06/14/23 0345 06/14/23 0405 06/14/23 0753 06/14/23 1327 06/14/23 2201  HGB   < >  --  10.6*  --   --   --  10.3*  --   --   --   --   --  10.1*  --   --   HCT  --   --  34.0*  --   --   --  32.2*  --   --   --   --   --  31.9*  --   --   PLT  --   --  122*  --   --   --  107*  --   --   --   --   --  140*  --   --   APTT  --   --   --    < > 87* 34  --  78*  --   --   --   --   --   --   --   HEPARINUNFRC  --   --   --    < > 0.81* 0.37  --   --  0.80*  --  0.84*  --   --  0.71* 0.56  CREATININE  --  4.80*  --   --   --   --  4.60*  --   --   --   --  5.56*  --   --   --   TROPONINIHS  --  34*  --   --   --   --   --   --  44* 43*  --   --   --   --   --    < > = values in this interval not displayed.    Estimated Creatinine Clearance: 10.3 mL/min (A) (by C-G formula based on SCr of 5.56 mg/dL (H)).   Medical History: Past Medical History:  Diagnosis Date   Arthritis    "left knee" (12/04/2016)   CAD (coronary artery disease)    a. 12/2016 NSTEMI/PCI: LM nl, LaD 20p/d, D1 20, OM2 20, OM3 20, RCA 9m (3.0x18 Resolute DES). EF 65%.   Chronic lower back pain    Diverticulosis    on CT   ESRD on dialysis [facility])    GERD (gastroesophageal reflux disease)    Hiatal hernia    History of echocardiogram     a. 09/2022 Echo: EF 60-65%, mild asymm LVH, mod reduced RV fxn, RVSP . Mildly dil RA. Mild AI. Ao sclerosis. Mild MR.   Hyperlipidemia    Hypertension    a. 12/2016 labetalol d/c'd 2/2 hypotension in HD.   Morbid obesity (HCC)    Obesity    Persistent atrial fibrillation (HCC)    a. 05/2023 Zio: 100% afib avg HR 52 (32-99).  Pneumonia    "couple times" (12/04/2016)   Type II diabetes mellitus (HCC)     Medications:  Eliquis prior to admission (last dose 7/7 @2100 )  Assessment: 77 year old female admitted with circulatory shock, of which has improved. Plan is for PPM, and thus holding Eliquis prior to procedure.   Goal of Therapy:  anti-Xa 0.3-0.7 Monitor platelets by anticoagulation protocol: Yes    Plan: anti-Xa level therapeutic  continue heparin infusion at 1250 units/hr Recheck  HL daily w/ AM labs while therapeutic anti-Xa & CBC at least once daily while on heparin  Otelia Sergeant, PharmD, [facility] 06/14/2023 10:39 PM

## 2023-06-14 NOTE — Progress Notes (Signed)
ANTICOAGULATION CONSULT NOTE  Pharmacy Consult for IV heparin Indication: atrial fibrillation  Allergies  Allergen Reactions   Sulfa Antibiotics Other (See Comments)   Sulfa Drugs Cross Reactors Other (See Comments)    Doesn't remember     Patient Measurements: Height: 5\' 4"  (162.6 cm) Weight: 117.9 kg (259 lb 14.8 oz) IBW/kg (Calculated) : 54.7 Heparin Dosing Weight: 115.5 kg  Vital Signs: Temp: 97.7 F (36.5 C) (07/12 2334) Temp Source: Oral (07/13 0200) BP: 121/39 (07/12 2334) Pulse Rate: 61 (07/13 0300)  Labs: Recent Labs    06/11/23 1722 06/11/23 1934 06/12/23 0211 06/12/23 0405 06/12/23 0547 06/12/23 1431 06/13/23 0022 06/13/23 0604 06/13/23 0951 06/13/23 1729 06/13/23 1923 06/14/23 0345  HGB 10.2*  --   --  10.6*  --   --   --  10.3*  --   --   --   --   HCT 32.6*  --   --  34.0*  --   --   --  32.2*  --   --   --   --   PLT 110*  --   --  122*  --   --   --  107*  --   --   --   --   APTT  --   --   --   --    < > 87* 34  --  78*  --   --   --   HEPARINUNFRC  --   --   --   --    < > 0.81* 0.37  --   --  0.80*  --  0.84*  CREATININE 6.22*  --  4.80*  --   --   --   --  4.60*  --   --   --   --   TROPONINIHS 39*   < > 34*  --   --   --   --   --   --  44* 43*  --    < > = values in this interval not displayed.    Estimated Creatinine Clearance: 12.5 mL/min (A) (by C-G formula based on SCr of 4.6 mg/dL (H)).   Medical History: Past Medical History:  Diagnosis Date   Arthritis    "left knee" (12/04/2016)   CAD (coronary artery disease)    a. 12/2016 NSTEMI/PCI: LM nl, LaD 20p/d, D1 20, OM2 20, OM3 20, RCA 40m (3.0x18 Resolute DES). EF 65%.   Chronic lower back pain    Diverticulosis    on CT   ESRD on dialysis [facility])    GERD (gastroesophageal reflux disease)    Hiatal hernia    History of echocardiogram    a. 09/2022 Echo: EF 60-65%, mild asymm LVH, mod reduced RV fxn, RVSP . Mildly dil RA. Mild AI. Ao sclerosis. Mild MR.   Hyperlipidemia     Hypertension    a. 12/2016 labetalol d/c'd 2/2 hypotension in HD.   Morbid obesity (HCC)    Obesity    Persistent atrial fibrillation (HCC)    a. 05/2023 Zio: 100% afib avg HR 52 (32-99).   Pneumonia    "couple times" (12/04/2016)   Type II diabetes mellitus (HCC)     Medications:  Eliquis prior to admission (last dose 7/7 @2100 )  Assessment: 77 year old female admitted with circulatory shock, of which has improved. Plan is for PPM, and thus holding Eliquis prior to procedure.   Goal of Therapy:  HL 0.3-0.7 aPTT 66-102 seconds Monitor  platelets by anticoagulation protocol: Yes  07/08 2314 aPTT > 200, Supratherapeutic @ 1800 units/hr 07/09 1750 aPTT > 200, supratherapeutic 1800 > 1400 units/hr 07/11 0547 aPTT 61, subtherapeutic 07/11 0022 aPTT 34, subtherapeutic 07/12 0008 HL 0.37 07/12 0951 aPTT 78 07/12 1729 HL 0.80,  Supratherapeutic   1500>1400 07/13 0345 HL 0.84, supratherapeutic    Plan:  Decrease heparin gtt to 1250 units/hr Check Next HL 8 hours from rate change HL & CBC daily while on heparin  Otelia Sergeant, PharmD, [facility] 06/14/2023 5:07 AM

## 2023-06-14 NOTE — Progress Notes (Signed)
NAME:  Jacqueline [name], MRN:  [id number], DOB:  1946-09-29, LOS: 7 ADMISSION DATE:  06/07/2023, CONSULTATION DATE:  06/07/2023 REFERRING MD:  Dr. Allena Katz, CHIEF COMPLAINT:  Hypotension   Brief Pt Description / Synopsis:  77 y.o woman with PMHx significant for chronic hypotension (SBP in 80's on Midodrine 10 mg TID) and ESRD on HD (TTS) admitted with Cardiogenic shock due to Bradycardia.   History of Present Illness:  77 y.o female with significant PMHx significant for HTN, T2DM, ESRD on HD TTS, Chronic debility, CAD s/p stent, PAF, OSA, PAD, Left knee arthritis, Chronic lower back pain, GERD, Obesity, HLD who presented to the ED from Dialysis with generalized weakness x 4 days.   EMS report: NVD and LLQ abdominal pain since last night. States tried to eat yesterday, each time she ate she vomited, then had diarrhea last night. No NVD today. She went to Dialysis today and was noted with low BP of 72/42. She usually runs lows BP and takes Midodrine but today her BP was lower than usually so she was not dialyzed but was sent to ED for further evaluation.   ED Course:  Initial vital signs showed HR of 50 beats/minute, BP 81/51 mm Hg, the RR 16 breaths/minute, and the oxygen saturation 91% on 2L and a temperature of 97.90F (36.6C).   Pertinent Labs/Diagnostics.   Na+/ K+: 133/3.4 Glucose:130 BUN/Cr.:55/7.76 WBC:11.5 Plts: 146 PCT: pending Lactic acid: 1.3 EKG:?Afib vs junctional bradycardia rate 40bmp. Incomplete RBB,   CXR, CT scan obtained and show no signs of infection or other abnormality. Patient given, midodrine, atropine for significant bradycardia and  500 cc of fluids and started on broad-spectrum antibiotics Vanco cefepime and Flagyl for  suspected sepsis of unknown source. Patient remained hypotensive despite  intervention as above. PCCM consulted for possible pressors.  Pertinent Medical History:   Past Medical History:  Diagnosis Date   Arthritis    "left knee" (12/04/2016)   CAD  (coronary artery disease)    a. 12/2016 NSTEMI/PCI: LM nl, LaD 20p/d, D1 20, OM2 20, OM3 20, RCA 60m (3.0x18 Resolute DES). EF 65%.   Chronic lower back pain    Diverticulosis    on CT   ESRD on dialysis [facility])    GERD (gastroesophageal reflux disease)    Hiatal hernia    History of echocardiogram    a. 09/2022 Echo: EF 60-65%, mild asymm LVH, mod reduced RV fxn, RVSP . Mildly dil RA. Mild AI. Ao sclerosis. Mild MR.   Hyperlipidemia    Hypertension    a. 12/2016 labetalol d/c'd 2/2 hypotension in HD.   Morbid obesity (HCC)    Obesity    Persistent atrial fibrillation (HCC)    a. 05/2023 Zio: 100% afib avg HR 52 (32-99).   Pneumonia    "couple times" (12/04/2016)   Type II diabetes mellitus (HCC)    Micro Data:  7/6: Blood culture x2>> negative 7/6: MRSA PCR >> negative  Antimicrobials:   Anti-infectives (From admission, onward)    Start     Dose/Rate Route Frequency Ordered Stop   06/10/23 1200  vancomycin (VANCOCIN) IVPB 1000 mg/200 mL premix  Status:  Discontinued       Placed in "Followed by" Linked Group   1,000 mg 200 mL/hr over 60 Minutes Intravenous Every T-Th-Sa (Hemodialysis) 06/08/23 1058 06/09/23 1029   06/08/23 1145  vancomycin (VANCOCIN) IVPB 1000 mg/200 mL premix       Placed in "Followed by" Linked Group   1,000 mg 200  mL/hr over 60 Minutes Intravenous  Once 06/08/23 1058 06/08/23 1655   06/08/23 1100  piperacillin-tazobactam (ZOSYN) IVPB 2.25 g  Status:  Discontinued        2.25 g 100 mL/hr over 30 Minutes Intravenous Every 8 hours 06/08/23 1058 06/09/23 1029   06/07/23 1400  metroNIDAZOLE (FLAGYL) IVPB 500 mg        500 mg 100 mL/hr over 60 Minutes Intravenous  Once 06/07/23 1346 06/07/23 1652   06/07/23 1400  ceFEPIme (MAXIPIME) 2 g in sodium chloride 0.9 % 100 mL IVPB        2 g 200 mL/hr over 30 Minutes Intravenous  Once 06/07/23 1349 06/07/23 1512   06/07/23 1400  vancomycin (VANCOCIN) IVPB 1000 mg/200 mL premix        1,000 mg 200 mL/hr over 60  Minutes Intravenous  Once 06/07/23 1349 06/07/23 1652      Significant Hospital Events: Including procedures, antibiotic start and stop dates in addition to other pertinent events   06/07/2023: admit to ICU with bradycardia, started on dopamine 06/08/2023: weaned off dopamine, hypotensive again, dopamine restarted 06/09/2023:  Remains hypotensive requiring Levophed, (pt is vasculopath, doubt accuracy of cuff BP as pt is awake, alert, and asymptomatic).  Attempted Arterial line but unsuccessful, Vascular Surgery consulted to assist with placement. Nephrology consulted, received HD. 06/10/2023: Continues to require Levophed (6 mcg currently), when Levo weaned down does exhibit some Bradycardia in the upper 50's.  Cardiology considering R Heart Cath today. Holding on transfer to [facility] for pacer at this time. 06/11/2023: Levophed weaning down, this AM. LHC/RHC with mild-moderate non-obstructive CAD, patent proximal RCA stent, mild-moderately elevated L heart filling pressures, severely elevated R heart/pulm artery pressures, mildly reduced CO/CI. Episode of chest pressure with HD, resolved with morphine. 06/12/2023: Additional episodes of chest tightness/pressure overnight (0150 and 0554). EKG without changes, trops flat. Hypertensive to 227/80, then with NE off HR dropped to 29.  Droxidopa was eventually stopped. 06/13/2023: No events overnight, no chest pain or hypertension. Remains on Levophed.  Decrease Midodrine to 5 mg TID and restart Droxidopa, increase Florinef and stop Solu-cortef in attempts to wean Levophed off. Obtain CT Chest to rule out Pulmonary pathology for etiology of PH. 06/14/2023: chest pain yesterday. Re-started on levophed over night. CT shows no acute finding of ILD. Droxidopa re-started yesterday.  Interim History / Subjective:  -Pt is awake and alert, no complaints this am -Levophed re-started overnight   Objective:  Blood pressure (!) 121/39, pulse 62, temperature 97.7 F  (36.5 C), temperature source Oral, resp. rate 14, height 5\' 4"  (1.626 m), weight 118 kg, SpO2 100%.        Intake/Output Summary (Last 24 hours) at 06/14/2023 0809 Last data filed at 06/14/2023 0357 Gross per 24 hour  Intake 364.05 ml  Output 1500 ml  Net -1135.95 ml   Filed Weights   06/13/23 1809 06/13/23 2334 06/14/23 0500  Weight: 118.1 kg 117.9 kg 118 kg   Physical Examination: General: Chronically ill-appearing elderly woman in NAD. Pleasant and conversant, eating breakfast. HEENT: [place]/AT, anicteric sclera, PERRL, dry mucous membranes. Neuro: Awake, oriented x 4. Responds to verbal stimuli. Following commands consistently. Moves all 4 extremities spontaneously. Generalized weakness. CV: Variable HR, irregularly irregular rhythm, no m/g/r. PULM: Breathing even and unlabored on 2LNC. Lung fields CTAB in upper fields, faint bibasilar crackles. GI: Soft, nontender, nondistended. Normoactive bowel sounds. Extremities: No significant LE edema noted. Skin: Warm/dry, no rashes.  Resolved Hospital Problem List:  Assessment & Plan:   #Circulatory Shock: Suspect Cardiogenic given Bradycardia, improving #Afib with slow ventricular rate, improved #Pulmonary Hypertension #Moderate Tricuspid Regurgitation History of CAD, HLD, permanent A. Fib, chronic hypotension with SBP in 80's on Midodrine 10 mg TID Echo 7/7 with LVEF 60-65%, indeterminate diastolic parameters, RV systolic function mildly reduced, severe pulmonary hypertension, moderate tricuspid regurgitation. TSH/T4 WNL.  LHC/RHC 7/10 with mild-moderate non-obstructive CAD, patent proximal RCA stent, mild-moderately elevated L heart filling pressures, severely elevated R heart/pulm artery pressures, mildly reduced CO/CI. - Goal MAP > 65 - Avoiding fluid resuscitation in the setting of ESRD/HD - Levophed titrated to goal MAP; does best with low doses/not completely off-titrated; becomes bradycardic when NE completely off -  Inccrease midodrine to 1- mg TID and continue Droxidopa;  ?supine HTN related to midodine - Increase Florinef to 0.2 - Cardiac monitoring - Optimized electrolytes for K > 4, Mg > 2 - Heparin gtt for now, transition back to Eliquis as able - Secondary prevention with Zetia, Crestor - Appreciate Cardiology/EP input, less likely HOHF due to CI 2.4 on cath - Consideration for leadless PM placement on hold - Deemed not a good candiate for valve repair or MitraClip given degree of PH and RV dysfunction - Unlikely to tolerate PH management - CT showing no evidence of ILD  #Initial Concern for Sepsis of unknown etiology ~ LOW SUSPICION FOR SEPSIS No clear sign of sepsis though patient is hypotensive. Blood cultures are drawn and are negative to date.  CXR without sign of pneumonia, unable to make urine - Trend WBC, fever curve; leukocytosis improved - Follow Cx data, 7/6 BCx NGTD - Off of antibiotics, low threshold for resumption if WBC uptrends/patient becomes febrile  #ESRD on HD  #Hyponatremia #AG Metabolic Acidosis Outpatient - on HD TThS via LUE AVF); dry weight 115kg. 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 Doubt patient would be able to  tolerate PH treatment. No significant CAD to contribute to bradycardia. Less likely HOHF in the setting of CI 2.4.  - PMT consulted, appreciate assistance; will need ongoing GOC discussions if patient unable to tolerate more aggressive interventions   Best Practice (right click and "Reselect all SmartList Selections" daily)   Diet/type: Regular DVT prophylaxis: Heparin gtt GI prophylaxis: N/A Lines: Right radial A-line, and is still needed Foley:  N/A Code Status:  full code Last date of multidisciplinary goals of care discussion [7/12 - patient updated at bedside]  Labs   CBC: Recent Labs  Lab 06/10/23 0445 06/11/23 0454 06/11/23 1236 06/11/23 1237 06/11/23 1722 06/12/23 0405 06/13/23 0604  WBC 10.2 9.7  --   --  7.8 9.2 7.8  HGB 10.3* 10.5* 11.2* 10.9* 10.2* 10.6* 10.3*  HCT 32.8* 33.8* 33.0* 32.0* 32.6* 34.0* 32.2*  MCV 97.6 99.7  --   --  98.8 98.6 97.9  PLT 114* 114*  --   --  110* 122* 107*  Basic Metabolic Panel: Recent Labs  Lab 06/08/23 0726 06/09/23 0350 06/10/23 0445 06/11/23 0454 06/11/23 1236 06/11/23 1237 06/11/23 1722 06/12/23 0211 06/13/23 0604 06/14/23 0405  NA 133* 131*   < > 130*   < > 134* 131* 131* 132* 131*  K 4.1 3.8   < > 3.7   < > 3.9 3.8 3.3* 4.1 4.6  CL 92* 91*   < > 95*  --   --  95* 92* 94* 94*  CO2 25 23   < > 24  --   --  24 27 27 26   GLUCOSE 162* 219*   < > 184*  --   --  118* 202* 243* 167*  BUN 62* 72*   < > 53*  --   --  46* 29* 33* 56*  CREATININE 8.41* 9.89*   < > 7.69*  --   --  6.22* 4.80* 4.60* 5.56*  CALCIUM 7.7* 7.2*   < > 7.0*  --   --  7.1* 7.6* 7.7* 7.8*  MG 2.0 1.8  --   --   --   --  1.5* 2.0 2.2  --   PHOS 5.3* 6.0*   < > 4.3  --   --  3.7 2.6 1.9* 2.2*   < > = values in this interval not displayed.  GFR: Estimated Creatinine Clearance: 10.4 mL/min (A) (by C-G formula based on SCr of 5.56 mg/dL (H)). Recent Labs  Lab 06/07/23 1254 06/08/23 0726 06/09/23 1238 06/09/23 1739 06/10/23 0445 06/11/23 0454  06/11/23 1722 06/12/23 0405 06/13/23 0604  WBC 11.5*   < > 11.2*  --    < > 9.7 7.8 9.2 7.8  LATICACIDVEN 1.3  --  0.7 0.6  --   --   --   --   --    < > = values in this interval not displayed.  Liver Function Tests: Recent Labs  Lab 06/10/23 0445 06/11/23 0454 06/12/23 0211 06/14/23 0405  ALBUMIN 3.0* 2.9* 2.9* 2.9*   Recent Labs  Lab 06/07/23 1254  LIPASE 51   No results for input(s): "AMMONIA" in the last 168 hours.  ABG:    Component Value Date/Time   PHART 7.313 (L) 06/11/2023 1237   PCO2ART 48.7 (H) 06/11/2023 1237   PO2ART 90 06/11/2023 1237   HCO3 24.7 06/11/2023 1237   TCO2 26 06/11/2023 1237   ACIDBASEDEF 2.0 06/11/2023 1237   O2SAT 96 06/11/2023 1237   Coagulation Profile: No results for input(s): "INR", "PROTIME" in the last 168 hours.  Cardiac Enzymes: No results for input(s): "CKTOTAL", "CKMB", "CKMBINDEX", "TROPONINI" in the last 168 hours.  HbA1C: Hemoglobin A1C  Date/Time Value Ref Range Status  06/12/2021 12:00 AM 6.6  Final   Hgb A1c MFr Bld  Date/Time Value Ref Range Status  06/07/2023 07:44 PM 5.8 (H) 4.8 - 5.6 % Final    Comment:    (NOTE) Pre diabetes:          5.7%-6.4%  Diabetes:              >6.4%  Glycemic control for   <7.0% adults with diabetes   09/29/2022 04:55 PM 5.2 4.8 - 5.6 % Final    Comment:    (NOTE) Pre diabetes:          5.7%-6.4%  Diabetes:              >  6.4%  Glycemic control for   <7.0% adults with diabetes    CBG: Recent Labs  Lab 06/13/23 1928 06/13/23 2124 06/13/23 2357 06/14/23 0336 06/14/23 0734  GLUCAP 140* 155* 134* 145* 151*   Past Medical History:  She,  has a past medical history of Arthritis, CAD (coronary artery disease), Chronic lower back pain, Diverticulosis, ESRD on dialysis (HCC), GERD (gastroesophageal reflux disease), Hiatal hernia, History of echocardiogram, Hyperlipidemia, Hypertension, Morbid obesity (HCC), Obesity, Persistent atrial fibrillation (HCC), Pneumonia, and Type  II diabetes mellitus (HCC).   Surgical History:   Past Surgical History:  Procedure Laterality Date   ABDOMINAL HYSTERECTOMY     AV FISTULA PLACEMENT Left 01/27/2014   Procedure: ARTERIOVENOUS (AV) FISTULA CREATION;  Surgeon: Pryor Ochoa, MD;  Location: [facility] OR;  Service: Vascular;  Laterality: Left;   AV FISTULA REPAIR     "had it cleaned out"   CARDIAC CATHETERIZATION  ?1980s   CARDIAC CATHETERIZATION N/A 12/04/2016   Procedure: Left Heart Cath Angiography;  Surgeon: Kathleene Hazel, MD;  Location: [facility] INVASIVE CV LAB;  Service: Cardiovascular;  Laterality: N/A;   CARDIAC CATHETERIZATION N/A 12/04/2016   Procedure: Coronary Stent Intervention;  Surgeon: Kathleene Hazel, MD;  Location: [facility] INVASIVE CV LAB;  Service: Cardiovascular;  Laterality: N/A;  Mid RCA   CATARACT EXTRACTION W/ INTRAOCULAR LENS IMPLANT Left    COLONOSCOPY W/ BIOPSIES AND POLYPECTOMY     CORONARY ANGIOPLASTY WITH STENT PLACEMENT  12/04/2016   ESOPHAGOGASTRODUODENOSCOPY     INSERTION OF DIALYSIS CATHETER N/A 01/19/2014   Procedure: INSERTION OF DIALYSIS CATHETER;  Surgeon: Larina Earthly, MD;  Location: [facility] OR;  Service: Vascular;  Laterality: N/A;   RIGHT/LEFT HEART CATH AND CORONARY ANGIOGRAPHY N/A 06/11/2023   Procedure: RIGHT/LEFT HEART CATH AND CORONARY ANGIOGRAPHY;  Surgeon: Yvonne Kendall, MD;  Location: [facility] INVASIVE CV LAB;  Service: Cardiovascular;  Laterality: N/A;    Social History:   reports that she quit smoking about 39 years ago. Her smoking use included cigarettes. She started smoking about 59 years ago. She has a 20 pack-year smoking history. She has never used smokeless tobacco. She reports that she does not drink alcohol and does not use drugs.   Family History:  Her family history includes Breast cancer in her sister; CAD in her father and sister; Cancer in her brother; Diabetes in her mother; Heart attack in her father; Heart disease in her father; Hypertension in her father; Peripheral  vascular disease in her mother.   Allergies Allergies  Allergen Reactions   Sulfa Antibiotics Other (See Comments)   Sulfa Drugs Cross Reactors Other (See Comments)    Doesn't remember     Home Medications  Prior to Admission medications   Medication Sig Start Date End Date Taking? Authorizing Provider  acetaminophen (TYLENOL) 650 MG CR tablet Take 1,300 mg by mouth every 8 (eight) hours as needed for pain.    [provider]  albuterol (VENTOLIN HFA) 108 (90 Base) MCG/ACT inhaler Inhale 1-2 puffs into the lungs every 6 (six) hours as needed for wheezing or shortness of breath. 12/15/21   Franne Forts, DO  apixaban (ELIQUIS) 5 MG TABS tablet Take 1 tablet (5 mg total) by mouth 2 (two) times daily. 10/02/22   Tresa Moore, MD  B Complex-C-Zn-Folic Acid (DIALYVITE/ZINC) TABS Take 1 tablet by mouth daily.    [provider]  bisacodyl (DULCOLAX) 5 MG EC tablet Take 2 tablets (10 mg total) by mouth daily as needed for moderate  constipation. 10/02/22   Tresa Moore, MD  cinacalcet (SENSIPAR) 30 MG tablet Take 30 mg by mouth daily with supper.    [provider]  ezetimibe (ZETIA) 10 MG tablet Take 1 tablet (10 mg total) by mouth daily. 04/30/22   Iran Ouch, MD  ferric citrate (AURYXIA) 1 GM 210 MG(Fe) tablet Take 420 mg by mouth 3 (three) times daily with meals.    [provider]  fexofenadine (ALLEGRA) 180 MG tablet Take 180 mg by mouth daily.    [provider]  midodrine (PROAMATINE) 10 MG tablet Take 20 mg by mouth daily. (Take additional two tablets [20mg ] for administration at dialysis on Tuesday, Thursday and Saturday)    [provider]  midodrine (PROAMATINE) 10 MG tablet Take 20 mg by mouth daily as needed (for SBP <100).    [provider]  Omega-3 Fatty Acids (FISH OIL) 1000 MG CPDR Take 1,000 mg by mouth daily.    [provider]  omeprazole (PRILOSEC) 20 MG capsule Take 1 capsule (20 mg  total) by mouth daily. Patient taking differently: Take 20 mg by mouth 2 (two) times daily. 02/21/22   Arnette Felts, FNP  ondansetron (ZOFRAN-ODT) 4 MG disintegrating tablet Take 4 mg by mouth every 8 (eight) hours as needed for nausea or vomiting.    [provider]  polyethylene glycol (MIRALAX / GLYCOLAX) 17 g packet Take 17 g by mouth at bedtime.    [provider]  pregabalin (LYRICA) 100 MG capsule Take 100 mg by mouth at bedtime.    [provider]  pregabalin (LYRICA) 50 MG capsule Take 50 mg by mouth 2 (two) times daily.    [provider]  rosuvastatin (CRESTOR) 10 MG tablet Take 1 tablet (10 mg total) by mouth daily. 10/03/22   Tresa Moore, MD  sennosides-docusate sodium (SENOKOT-S) 8.6-50 MG tablet Take 1 tablet by mouth 2 (two) times daily.    [provider]  trolamine salicylate (ASPERCREME) 10 % cream Apply 1 Application topically 2 (two) times daily. (Apply to knees)    [provider]    Critical care time:   The patient is critically ill with multiple organ system failure and requires high complexity decision making for assessment and support, frequent evaluation and titration of therapies, advanced monitoring, review of radiographic studies and interpretation of complex data.   Critical Care Time devoted to patient care services, exclusive of separately billable procedures, described in this note is 65 minutes.  Earley Brooke, MD [facility] 06/14/23 8:09 AM  Please see [email or web address] for pager details. After hours, please call ELink [phone]

## 2023-06-14 NOTE — Progress Notes (Signed)
[facility]  ROUNDING NOTE   Subjective:  Hemodialysis treatment yesterday. Tolerated treatment well. UF of 1.5 liters.   Dialysis for later today.   Norepinephrine 53mcg/min  Objective:  Vital signs in last 24 hours:  Temp:  [97.7 F (36.5 C)-98.6 F (37 C)] 97.7 F (36.5 C) (07/13 0245) Pulse Rate:  [46-79] 58 (07/13 0845) Resp:  [12-23] 12 (07/13 1100) BP: (71-142)/(34-44) 71/38 (07/13 1100) SpO2:  [38 %-100 %] 98 % (07/13 0845) Arterial Line BP: (78-162)/(18-46) 78/18 (07/13 1100) Weight:  [117.9 kg-118.1 kg] 118 kg (07/13 0500)  Weight change: -0.7 kg Filed Weights   06/13/23 1809 06/13/23 2334 06/14/23 0500  Weight: 118.1 kg 117.9 kg 118 kg    Intake/Output: I/O last 3 completed shifts: In: 1049.8 [P.O.:237; I.V.:812.8] Out: 1500 [Other:1500]   Intake/Output this shift:  Total I/O In: 10 [I.V.:10] Out: -   Physical Exam: General: NAD, laying in bed  Head: Normocephalic, atraumatic. Moist oral mucosal membranes  Eyes: Anicteric  Lungs:  Clear to auscultation, normal effort  Heart: Regular rate and rhythm  Abdomen:  Soft, nontender,   Extremities: no peripheral edema.  Neurologic: Alert and oriented, moving all four extremities  Skin: No lesions  Access: Lt AVF    Basic Metabolic Panel: Recent Labs  Lab 06/08/23 0726 06/09/23 0350 06/10/23 0445 06/11/23 0454 06/11/23 1236 06/11/23 1237 06/11/23 1722 06/12/23 0211 06/13/23 0604 06/14/23 0405  NA 133* 131*   < > 130*   < > 134* 131* 131* 132* 131*  K 4.1 3.8   < > 3.7   < > 3.9 3.8 3.3* 4.1 4.6  CL 92* 91*   < > 95*  --   --  95* 92* 94* 94*  CO2 25 23   < > 24  --   --  24 27 27 26   GLUCOSE 162* 219*   < > 184*  --   --  118* 202* 243* 167*  BUN 62* 72*   < > 53*  --   --  46* 29* 33* 56*  CREATININE 8.41* 9.89*   < > 7.69*  --   --  6.22* 4.80* 4.60* 5.56*  CALCIUM 7.7* 7.2*   < > 7.0*  --   --  7.1* 7.6* 7.7* 7.8*  MG 2.0 1.8  --   --   --   --  1.5* 2.0 2.2  --   PHOS 5.3*  6.0*   < > 4.3  --   --  3.7 2.6 1.9* 2.2*   < > = values in this interval not displayed.    Liver Function Tests: Recent Labs  Lab 06/10/23 0445 06/11/23 0454 06/12/23 0211 06/14/23 0405  ALBUMIN 3.0* 2.9* 2.9* 2.9*   Recent Labs  Lab 06/07/23 1254  LIPASE 51   No results for input(s): "AMMONIA" in the last 168 hours.  CBC: Recent Labs  Lab 06/11/23 0454 06/11/23 1236 06/11/23 1237 06/11/23 1722 06/12/23 0405 06/13/23 0604 06/14/23 0753  WBC 9.7  --   --  7.8 9.2 7.8 12.9*  HGB 10.5*   < > 10.9* 10.2* 10.6* 10.3* 10.1*  HCT 33.8*   < > 32.0* 32.6* 34.0* 32.2* 31.9*  MCV 99.7  --   --  98.8 98.6 97.9 98.5  PLT 114*  --   --  110* 122* 107* 140*   < > = values in this interval not displayed.    Cardiac Enzymes: No results for input(s): "CKTOTAL", "CKMB", "CKMBINDEX", "TROPONINI" in  the last 168 hours.  BNP: Invalid input(s): "POCBNP"  CBG: Recent Labs  Lab 06/13/23 1928 06/13/23 2124 06/13/23 2357 06/14/23 0336 06/14/23 0734  GLUCAP 140* 155* 134* 145* 151*    Microbiology: Results for orders placed or performed during the hospital encounter of 06/07/23  Blood culture (routine x 2)     Status: None   Collection Time: 06/07/23 12:54 PM   Specimen: BLOOD  Result Value Ref Range Status   Specimen Description BLOOD RIGHT ANTECUBITAL  Final   Special Requests   Final    BOTTLES DRAWN AEROBIC AND ANAEROBIC Blood Culture adequate volume   Culture   Final    NO GROWTH 5 DAYS Performed at [facility], [address], [place], [place] [address]    Report Status 06/12/2023 FINAL  Final  Blood culture (routine x 2)     Status: None   Collection Time: 06/07/23 12:54 PM   Specimen: BLOOD  Result Value Ref Range Status   Specimen Description BLOOD BLOOD RIGHT FOREARM  Final   Special Requests   Final    BOTTLES DRAWN AEROBIC AND ANAEROBIC Blood Culture adequate volume   Culture   Final    NO GROWTH 5 DAYS Performed at [facility],  [address], [place], [place] [address]    Report Status 06/12/2023 FINAL  Final  MRSA Next Gen by PCR, Nasal     Status: None   Collection Time: 06/07/23 10:18 PM   Specimen: Nasal Mucosa; Nasal Swab  Result Value Ref Range Status   MRSA by PCR Next Gen NOT DETECTED NOT DETECTED Final    Comment: (NOTE) The GeneXpert MRSA Assay (FDA approved for NASAL specimens only), is one component of a comprehensive MRSA colonization surveillance program. It is not intended to diagnose MRSA infection nor to guide or monitor treatment for MRSA infections. Test performance is not FDA approved in patients less than 40 years old. Performed at [facility], [address] [place], [place] [address]     Coagulation Studies: No results for input(s): "LABPROT", "INR" in the last 72 hours.  Urinalysis: No results for input(s): "COLORURINE", "LABSPEC", "PHURINE", "GLUCOSEU", "HGBUR", "BILIRUBINUR", "KETONESUR", "PROTEINUR", "UROBILINOGEN", "NITRITE", "LEUKOCYTESUR" in the last 72 hours.  Invalid input(s): "APPERANCEUR"    Imaging: [place] Dialysis Access  Result Date: 06/14/2023 CLINICAL DATA:  ESRD with indwelling left brachiocephalic AV fistula. Persistent hypotension on dialysis with suspected high-volume flows via the AV fistula. EXAM: ULTRASOUND DIALYSIS ACCESS TECHNIQUE: Limited duplex evaluation of the left brachiocephalic AV fistula was performed including color Doppler evaluation and sampling in portions of the arterial inflow and cephalic venous outflow. COMPARISON:  None Available. FINDINGS: Brachial artery inflow demonstrates normal arterial waveform and normal patency. Arterial anastomosis is patent. Normally patent flow is noted in a mature cephalic vein without significantly elevated velocities to suggest extremely high flow. Maximal measured velocity in the venous outflow was less than 40 centimeters/second. Flow is not very turbulent. The entire venous outflow was not assessed.  IMPRESSION: Patent left brachiocephalic AV fistula with normal arterial inflow and patent assessed cephalic venous outflow. The assess cephalic venous outflow velocities and waveforms do not suggest extremely high flow. Electronically Signed   By: Irish Lack M.D.   On: 06/14/2023 08:38   CT CHEST WO CONTRAST  Result Date: 06/13/2023 CLINICAL DATA:  Shortness of breath. EXAM: CT CHEST WITHOUT CONTRAST TECHNIQUE: Multidetector CT imaging of the chest was performed following the standard protocol without IV contrast. RADIATION DOSE REDUCTION: This  exam was performed according to the departmental dose-optimization program which includes automated exposure control, adjustment of the mA and/or kV according to patient size and/or use of iterative reconstruction technique. COMPARISON:  CTA chest June 20, 23. FINDINGS: Cardiovascular: Cardiomegaly. Coronary artery atherosclerosis. Aortic atherosclerosis. Mediastinum/Nodes: No enlarged mediastinal or axillary lymph nodes. Thyroid gland, trachea, and esophagus demonstrate no significant findings. Lungs/Pleura: Mild dependent atelectasis. Otherwise, clear lungs. No pleural effusions. No pneumothorax. Upper Abdomen: No acute abnormality. Partially imaged exophytic left renal cyst. Left axial via anterior abdominal wall. Musculoskeletal: Multilevel degenerative change.  No acute fracture. IMPRESSION: 1. Mild dependent atelectasis.  Otherwise, clear lungs. 2. Cardiomegaly. 3. Coronary artery and aortic atherosclerosis (ICD10-I70.0). Electronically Signed   By: Feliberto Harts M.D.   On: 06/13/2023 12:35     Medications:    sodium chloride Stopped (06/10/23 1900)   sodium chloride     anticoagulant sodium citrate     heparin 1,250 Units/hr (06/14/23 0518)   norepinephrine (LEVOPHED) Adult infusion 3 mcg/min (06/14/23 1610)    Chlorhexidine Gluconate Cloth  6 each Topical Daily   droxidopa  100 mg Oral TID WC   ezetimibe  10 mg Oral Daily   feeding  supplement (NEPRO CARB STEADY)  237 mL Oral TID BM   fludrocortisone  0.2 mg Oral Daily   insulin aspart  0-20 Units Subcutaneous TID WC   insulin aspart  0-5 Units Subcutaneous QHS   insulin glargine-yfgn  5 Units Subcutaneous Daily   loratadine  10 mg Oral Daily   midodrine  10 mg Oral TID WC   multivitamin  1 tablet Oral QHS   pregabalin  100 mg Oral QHS   pregabalin  50 mg Oral BID   rosuvastatin  10 mg Oral Daily   senna-docusate  1 tablet Oral BID   sodium chloride flush  3 mL Intravenous Q12H   sodium chloride, acetaminophen, alteplase, alum & mag hydroxide-simeth, anticoagulant sodium citrate, bisacodyl, docusate sodium, heparin, HYDROcodone-acetaminophen, lidocaine (PF), lidocaine-prilocaine, morphine injection, nitroGLYCERIN, ondansetron (ZOFRAN) IV, mouth rinse, pentafluoroprop-tetrafluoroeth, polyethylene glycol, sodium chloride flush  Assessment/ Plan:  [name] is a 77 y.o.  female  with a PMHx of ESRD on HD TTS at [facility], diabetes mellitus type 2, chronic hypotension, peripheral arterial disease, obstructive sleep apnea, GERD, obesity, hyperlipidemia, anemia chronic kidney disease, secondary hyperparathyroidism, who was admitted to [facility] on 06/07/2023 for evaluation of generalized weakness.   1  ESRD on HD TTS: remains on norepinephrine.  - daily dialysis to get to optimum volume status.  - Then resume TTS schedule.   2.  Hypotension with severe pulmonary hypertension..  Chronic in nature.  Systolic blood pressure usually in the 80s as an outpatient.   - midodrine 10 mg 3 times daily as outpatient.    - remains requiring vasopressors.   3. Anemia of chronic kidney disease Lab Results  Component Value Date   HGB 10.1 (L) 06/14/2023    Continue Mircera as an outpatient.  4. Diabetes mellitus type II with chronic kidney disease/renal manifestations: noninsulin dependent.  Most recent hemoglobin A1c is 5.8 on 06/07/23.  - continue glucose control.    5. Secondary Hyperparathyroidism:     Lab Results  Component Value Date   PTH 388.6 (H) 01/17/2014   CALCIUM 7.8 (L) 06/14/2023   CAION 0.92 (L) 06/11/2023   PHOS 2.2 (L) 06/14/2023    Holding Auryxia and Sensipar     LOS: 7 Jacqueline [name] 7/13/202411:35 AM

## 2023-06-14 NOTE — Progress Notes (Addendum)
[name] PROGRESS NOTE  ASSESSMENT / PLAN: Jacqueline [name] is a 77 y.o. female with ESRD on HD TTS via left arm brachiocephalic arteriovenous fistula. The patient has had persistent low diastolic pressure and low systemic vascular resistance suggesting high output fistula may be contributing to hemodynamic embarrassment. Duplex ultrasound of the arm has been done, but not yet read. If very high volume flows are confirmed, Dr. Wyn Quaker will plan to band the fistula early next week.   SUBJECTIVE: Sleeping comfortably. No complaints. Dialysis went well yesterday. Fistula worked at dialysis.  OBJECTIVE: BP (!) 121/39 (BP Location: Right Wrist)   Pulse 62   Temp 97.7 F (36.5 C) (Oral)   Resp 14   Ht 5\' 4"  (1.626 m)   Wt 118 kg   SpO2 100%   BMI 44.65 kg/m   Intake/Output Summary (Last 24 hours) at 06/14/2023 0811 Last data filed at 06/14/2023 0357 Gross per 24 hour  Intake 364.05 ml  Output 1500 ml  Net -1135.95 ml    No distress Left arm brachiocephalic arteriovenous fistula with smooth thrill     Latest Ref Rng & Units 06/14/2023    7:53 AM 06/13/2023    6:04 AM 06/12/2023    4:05 AM  CBC  WBC 4.0 - 10.5 K/uL 12.9  7.8  9.2   Hemoglobin 12.0 - 15.0 g/dL 16.1  09.6  04.5   Hematocrit 36.0 - 46.0 % 31.9  32.2  34.0   Platelets 150 - 400 K/uL 140  107  122         Latest Ref Rng & Units 06/14/2023    4:05 AM 06/13/2023    6:04 AM 06/12/2023    2:11 AM  CMP  Glucose 70 - 99 mg/dL 409  811  914   BUN 8 - 23 mg/dL 56  33  29   Creatinine 0.44 - 1.00 mg/dL 7.82  9.56  2.13   Sodium 135 - 145 mmol/L 131  132  131   Potassium 3.5 - 5.1 mmol/L 4.6  4.1  3.3   Chloride 98 - 111 mmol/L 94  94  92   CO2 22 - 32 mmol/L 26  27  27    Calcium 8.9 - 10.3 mg/dL 7.8  7.7  7.6     Estimated Creatinine Clearance: 10.4 mL/min (A) (by C-G formula based on SCr of 5.56 mg/dL (H)).  Jacqueline [name]. Jacqueline Antu, MD [facility] Phone  Number: [phone] 06/14/2023 8:11 AM

## 2023-06-15 DIAGNOSIS — I4821 Permanent atrial fibrillation: Secondary | ICD-10-CM | POA: Diagnosis not present

## 2023-06-15 DIAGNOSIS — R001 Bradycardia, unspecified: Secondary | ICD-10-CM | POA: Diagnosis not present

## 2023-06-15 DIAGNOSIS — I272 Pulmonary hypertension, unspecified: Secondary | ICD-10-CM | POA: Diagnosis not present

## 2023-06-15 DIAGNOSIS — I959 Hypotension, unspecified: Secondary | ICD-10-CM | POA: Diagnosis not present

## 2023-06-15 LAB — GLUCOSE, CAPILLARY
Glucose-Capillary: 131 mg/dL — ABNORMAL HIGH (ref 70–99)
Glucose-Capillary: 138 mg/dL — ABNORMAL HIGH (ref 70–99)
Glucose-Capillary: 141 mg/dL — ABNORMAL HIGH (ref 70–99)
Glucose-Capillary: 154 mg/dL — ABNORMAL HIGH (ref 70–99)
Glucose-Capillary: 183 mg/dL — ABNORMAL HIGH (ref 70–99)
Glucose-Capillary: 186 mg/dL — ABNORMAL HIGH (ref 70–99)

## 2023-06-15 LAB — RENAL FUNCTION PANEL
Albumin: 2.9 g/dL — ABNORMAL LOW (ref 3.5–5.0)
Anion gap: 9 (ref 5–15)
BUN: 38 mg/dL — ABNORMAL HIGH (ref 8–23)
CO2: 27 mmol/L (ref 22–32)
Calcium: 7.8 mg/dL — ABNORMAL LOW (ref 8.9–10.3)
Chloride: 96 mmol/L — ABNORMAL LOW (ref 98–111)
Creatinine, Ser: 4.1 mg/dL — ABNORMAL HIGH (ref 0.44–1.00)
GFR, Estimated: 11 mL/min — ABNORMAL LOW (ref >60)
Glucose, Bld: 206 mg/dL — ABNORMAL HIGH (ref 70–99)
Phosphorus: 2.2 mg/dL — ABNORMAL LOW (ref 2.5–4.6)
Potassium: 3.8 mmol/L (ref 3.5–5.1)
Sodium: 132 mmol/L — ABNORMAL LOW (ref 135–145)

## 2023-06-15 LAB — CBC
HCT: 32.2 % — ABNORMAL LOW (ref 36.0–46.0)
Hemoglobin: 10 g/dL — ABNORMAL LOW (ref 12.0–15.0)
MCH: 30.8 pg (ref 26.0–34.0)
MCHC: 31.1 g/dL (ref 30.0–36.0)
MCV: 99.1 fL (ref 80.0–100.0)
Platelets: 148 10*3/uL — ABNORMAL LOW (ref 150–400)
RBC: 3.25 MIL/uL — ABNORMAL LOW (ref 3.87–5.11)
RDW: 15 % (ref 11.5–15.5)
WBC: 12.5 10*3/uL — ABNORMAL HIGH (ref 4.0–10.5)
nRBC: 0 % (ref 0.0–0.2)

## 2023-06-15 LAB — HEPARIN LEVEL (UNFRACTIONATED): Heparin Unfractionated: 0.44 IU/mL (ref 0.30–0.70)

## 2023-06-15 LAB — MAGNESIUM: Magnesium: 1.8 mg/dL (ref 1.7–2.4)

## 2023-06-15 MED ORDER — CHLORHEXIDINE GLUCONATE CLOTH 2 % EX PADS
6.0000 | MEDICATED_PAD | Freq: Every day | CUTANEOUS | Status: DC
  Administered 2023-06-17 – 2023-06-22 (×4): 6 via TOPICAL

## 2023-06-15 NOTE — Progress Notes (Signed)
Patient Name: Jacqueline [name] Date of Encounter: 06/15/2023 [facility] Cardiologist: Lorine Bears, MD   Interval Summary  .    Feeling OK.  No specific complaints.  Vital Signs .    Vitals:   06/15/23 0300 06/15/23 0400 06/15/23 0500 06/15/23 0600  BP:      Pulse: 85 80 83 77  Resp: 15 16 14 14   Temp:      TempSrc:      SpO2: 100% 91% 91% 99%  Weight:   120.8 kg   Height:        Intake/Output Summary (Last 24 hours) at 06/15/2023 0954 Last data filed at 06/15/2023 0400 Gross per 24 hour  Intake 895.03 ml  Output 1500 ml  Net -604.97 ml      06/15/2023    5:00 AM 06/14/2023    7:28 PM 06/14/2023    3:18 PM  Last 3 Weights  Weight (lbs) 266 lb 5.1 oz 256 lb 13.4 oz 260 lb 5.8 oz  Weight (kg) 120.8 kg 116.5 kg 118.1 kg      Telemetry/ECG    Sinus bradycardia.  Heart rate ranging from the 40s to the 60s.- Personally Reviewed  Physical Exam .    VS:  BP (!) 119/31 (BP Location: Other (Comment)) Comment (BP Location): right A-line  Pulse 77   Temp 98 F (36.7 C) (Oral)   Resp 14   Ht 5\' 4"  (1.626 m)   Wt 120.8 kg   SpO2 99%   BMI 45.71 kg/m  , BMI Body mass index is 45.71 kg/m. GENERAL: Chronically ill-appearing.  No acute distress. HEENT: Pupils equal round and reactive, fundi not visualized, oral mucosa unremarkable NECK:  No jugular venous distention, waveform within normal limits, carotid upstroke brisk and symmetric, no bruits, no thyromegaly LUNGS:  Clear to auscultation bilaterally HEART: Bradycardic.  Regular rhythm.  PMI not displaced or sustained,S1 and S2 within normal limits, no S3, no S4, no clicks, no rubs, no murmurs ABD:  Flat, positive bowel sounds normal in frequency in pitch, no bruits, no rebound, no guarding, no midline pulsatile mass, no hepatomegaly, no splenomegaly EXT:  2 plus pulses throughout, no edema, no cyanosis no clubbing SKIN:  No rashes no nodules NEURO:  Cranial nerves II through XII grossly intact, motor  grossly intact throughout PSYCH:  Cognitively intact, oriented to person place and time   Assessment & Plan .     # Bradycardia: # Hypotension: Heart rate stable on norepinephrine.  Agree with Dr. Okey Dupre that it is not clear that bradycardia is causing her hypotension or symptoms.  Per Dr. Graciela Husbands, she was hypotensive even with heart rates in normal range.  No plans for pacemaker at this time. She is on midodrine, fludrocortisone and droxidopa.  Continue blood pressure management by nephrology and primary team. Given her low diastolic BP there was concern for low SVR 2/2 fistula and shunting. Howevre, on ultrasound 06/13/23, flow was found to be in acceptable range.  She remains on Levophed.  Not thought to be due to sepsis.  Consider changing florinef to bid.  Will defer to nephrology.  # CAD s/p RCA PCI: # Hyperlipidemia: Cath this admission showed patent RCA stent and mild-mod non-obstructive CAD.  Severely elevated R heart and pulmonary pressures.  Continue rosuvastatin and Zetia.  # Severe pulmonary HTN: LV function is normal and RV moderately reduced on echo.  RHC demonstrated severely elevated pulmonary pressure as below.  Pulmonary pressure did not exceed systemic pressure.  Volume management challenging given her hypotension.  Volume is managed by nephrology and dialysis.   RA (mean): 21 mmHg RV (S/EDP): 78/18 mmHg PA (S/D, mean): 73/30 (44) mmHg PCWP (mean): 30 mmHg with prominent v-waves (versus incomplete wedging)  Ao sat: 96% PA sat: 60%  Fick CO: 5.3 L/min Fick CI: 2.4 L/min/m^2  Thermodilution CO: 4.3 L/min Thermodilution CI: 2.0 L/min/m^2   # ESRD on HD: Per nephrology  # Persistent atrial fibrillation:  Bradycardic as above.  Therefore not on any nodal agents.  She remains on IV heparin.  She is on Eliquis at baseline.  # Mod-severe TR:  Not a good candidate for repair given RV dysfuction and pulmonary HTN.  # OSA: Patient denies diagnosis of OSA and reports that  she does not use a CPAP at home.  Given her severe pulmonary hypertension and pending palliative care discussions this may need further outpatient investigation.  Patient declined palliative attempts at discussion.       For questions or updates, please contact [facility] Please consult [email or web address] for contact info under        Signed, Chilton Si, MD

## 2023-06-15 NOTE — Progress Notes (Signed)
ANTICOAGULATION CONSULT NOTE  Pharmacy Consult for IV heparin Indication: atrial fibrillation  Allergies  Allergen Reactions   Sulfa Antibiotics Other (See Comments)   Sulfa Drugs Cross Reactors Other (See Comments)    Doesn't remember     Patient Measurements: Height: 5\' 4"  (162.6 cm) Weight: 116.5 kg (256 lb 13.4 oz) IBW/kg (Calculated) : 54.7 Heparin Dosing Weight: 115.5 kg  Vital Signs: Temp: 99.1 F (37.3 C) (07/13 2000) Temp Source: Oral (07/13 2000) BP: 119/31 (07/13 1914) Pulse Rate: 78 (07/14 0100)  Labs: Recent Labs    06/12/23 1431 06/13/23 0022 06/13/23 0022 06/13/23 0604 06/13/23 0951 06/13/23 1729 06/13/23 1923 06/14/23 0345 06/14/23 0405 06/14/23 0753 06/14/23 1327 06/14/23 2201 06/15/23 0438  HGB  --   --    < > 10.3*  --   --   --   --   --  10.1*  --   --  10.0*  HCT  --   --   --  32.2*  --   --   --   --   --  31.9*  --   --  32.2*  PLT  --   --   --  107*  --   --   --   --   --  140*  --   --  148*  APTT 87* 34  --   --  78*  --   --   --   --   --   --   --   --   HEPARINUNFRC 0.81* 0.37  --   --   --  0.80*  --    < >  --   --  0.71* 0.56 0.44  CREATININE  --   --   --  4.60*  --   --   --   --  5.56*  --   --   --  4.10*  TROPONINIHS  --   --   --   --   --  44* 43*  --   --   --   --   --   --    < > = values in this interval not displayed.    Estimated Creatinine Clearance: 13.9 mL/min (A) (by C-G formula based on SCr of 4.1 mg/dL (H)).   Medical History: Past Medical History:  Diagnosis Date   Arthritis    "left knee" (12/04/2016)   CAD (coronary artery disease)    a. 12/2016 NSTEMI/PCI: LM nl, LaD 20p/d, D1 20, OM2 20, OM3 20, RCA 69m (3.0x18 Resolute DES). EF 65%.   Chronic lower back pain    Diverticulosis    on CT   ESRD on dialysis [facility])    GERD (gastroesophageal reflux disease)    Hiatal hernia    History of echocardiogram    a. 09/2022 Echo: EF 60-65%, mild asymm LVH, mod reduced RV fxn, RVSP . Mildly dil RA.  Mild AI. Ao sclerosis. Mild MR.   Hyperlipidemia    Hypertension    a. 12/2016 labetalol d/c'd 2/2 hypotension in HD.   Morbid obesity (HCC)    Obesity    Persistent atrial fibrillation (HCC)    a. 05/2023 Zio: 100% afib avg HR 52 (32-99).   Pneumonia    "couple times" (12/04/2016)   Type II diabetes mellitus (HCC)     Medications:  Eliquis prior to admission (last dose 7/7 @2100 )  Assessment: 77 year old female admitted with circulatory shock, of which has improved. Plan  is for PPM, and thus holding Eliquis prior to procedure.   Goal of Therapy:  anti-Xa 0.3-0.7 Monitor platelets by anticoagulation protocol: Yes    Plan: anti-Xa level therapeutic x 2 continue heparin infusion at 1250 units/hr Recheck  HL daily w/ AM labs while therapeutic anti-Xa & CBC at least once daily while on heparin  Otelia Sergeant, PharmD, [facility] 06/15/2023 5:29 AM

## 2023-06-15 NOTE — Progress Notes (Signed)
NAME:  Jacqueline [name], MRN:  [id number], DOB:  09-07-1946, LOS: 8 ADMISSION DATE:  06/07/2023, CONSULTATION DATE:  06/07/2023 REFERRING MD:  Dr. Allena Katz, CHIEF COMPLAINT:  Hypotension   Brief Pt Description / Synopsis:  77 y.o woman with PMHx significant for chronic hypotension (SBP in 79's on Midodrine 10 mg TID) and ESRD on HD (TTS) admitted with Cardiogenic shock due to Bradycardia.   History of Present Illness:  77 y.o female with significant PMHx significant for HTN, T2DM, ESRD on HD TTS, Chronic debility, CAD s/p stent, PAF, OSA, PAD, Left knee arthritis, Chronic lower back pain, GERD, Obesity, HLD who presented to the ED from Dialysis with generalized weakness x 4 days.   EMS report: NVD and LLQ abdominal pain since last night. States tried to eat yesterday, each time she ate she vomited, then had diarrhea last night. No NVD today. She went to Dialysis today and was noted with low BP of 72/42. She usually runs lows BP and takes Midodrine but today her BP was lower than usually so she was not dialyzed but was sent to ED for further evaluation.   ED Course:  Initial vital signs showed HR of 50 beats/minute, BP 81/51 mm Hg, the RR 16 breaths/minute, and the oxygen saturation 91% on 2L and a temperature of 97.22F (36.6C).   Pertinent Labs/Diagnostics.   Na+/ K+: 133/3.4 Glucose:130 BUN/Cr.:55/7.76 WBC:11.5 Plts: 146 PCT: pending Lactic acid: 1.3 EKG:?Afib vs junctional bradycardia rate 40bmp. Incomplete RBB,   CXR, CT scan obtained and show no signs of infection or other abnormality. Patient given, midodrine, atropine for significant bradycardia and  500 cc of fluids and started on broad-spectrum antibiotics Vanco cefepime and Flagyl for  suspected sepsis of unknown source. Patient remained hypotensive despite  intervention as above. PCCM consulted for possible pressors.  Pertinent Medical History:   Past Medical History:  Diagnosis Date   Arthritis    "left knee" (12/04/2016)   CAD  (coronary artery disease)    a. 12/2016 NSTEMI/PCI: LM nl, LaD 20p/d, D1 20, OM2 20, OM3 20, RCA 105m (3.0x18 Resolute DES). EF 65%.   Chronic lower back pain    Diverticulosis    on CT   ESRD on dialysis [facility])    GERD (gastroesophageal reflux disease)    Hiatal hernia    History of echocardiogram    a. 09/2022 Echo: EF 60-65%, mild asymm LVH, mod reduced RV fxn, RVSP . Mildly dil RA. Mild AI. Ao sclerosis. Mild MR.   Hyperlipidemia    Hypertension    a. 12/2016 labetalol d/c'd 2/2 hypotension in HD.   Morbid obesity (HCC)    Obesity    Persistent atrial fibrillation (HCC)    a. 05/2023 Zio: 100% afib avg HR 52 (32-99).   Pneumonia    "couple times" (12/04/2016)   Type II diabetes mellitus (HCC)    Micro Data:  7/6: Blood culture x2>> negative 7/6: MRSA PCR >> negative  Antimicrobials:   Anti-infectives (From admission, onward)    Start     Dose/Rate Route Frequency Ordered Stop   06/10/23 1200  vancomycin (VANCOCIN) IVPB 1000 mg/200 mL premix  Status:  Discontinued       Placed in "Followed by" Linked Group   1,000 mg 200 mL/hr over 60 Minutes Intravenous Every T-Th-Sa (Hemodialysis) 06/08/23 1058 06/09/23 1029   06/08/23 1145  vancomycin (VANCOCIN) IVPB 1000 mg/200 mL premix       Placed in "Followed by" Linked Group   1,000 mg 200  mL/hr over 60 Minutes Intravenous  Once 06/08/23 1058 06/08/23 1655   06/08/23 1100  piperacillin-tazobactam (ZOSYN) IVPB 2.25 g  Status:  Discontinued        2.25 g 100 mL/hr over 30 Minutes Intravenous Every 8 hours 06/08/23 1058 06/09/23 1029   06/07/23 1400  metroNIDAZOLE (FLAGYL) IVPB 500 mg        500 mg 100 mL/hr over 60 Minutes Intravenous  Once 06/07/23 1346 06/07/23 1652   06/07/23 1400  ceFEPIme (MAXIPIME) 2 g in sodium chloride 0.9 % 100 mL IVPB        2 g 200 mL/hr over 30 Minutes Intravenous  Once 06/07/23 1349 06/07/23 1512   06/07/23 1400  vancomycin (VANCOCIN) IVPB 1000 mg/200 mL premix        1,000 mg 200 mL/hr over 60  Minutes Intravenous  Once 06/07/23 1349 06/07/23 1652      Significant Hospital Events: Including procedures, antibiotic start and stop dates in addition to other pertinent events   06/07/2023: admit to ICU with bradycardia, started on dopamine 06/08/2023: weaned off dopamine, hypotensive again, dopamine restarted 06/09/2023:  Remains hypotensive requiring Levophed, (pt is vasculopath, doubt accuracy of cuff BP as pt is awake, alert, and asymptomatic).  Attempted Arterial line but unsuccessful, Vascular Surgery consulted to assist with placement. Nephrology consulted, received HD. 06/10/2023: Continues to require Levophed (6 mcg currently), when Levo weaned down does exhibit some Bradycardia in the upper 50's.  Cardiology considering R Heart Cath today. Holding on transfer to [facility] for pacer at this time. 06/11/2023: Levophed weaning down, this AM. LHC/RHC with mild-moderate non-obstructive CAD, patent proximal RCA stent, mild-moderately elevated L heart filling pressures, severely elevated R heart/pulm artery pressures, mildly reduced CO/CI. Episode of chest pressure with HD, resolved with morphine. 06/12/2023: Additional episodes of chest tightness/pressure overnight (0150 and 0554). EKG without changes, trops flat. Hypertensive to 227/80, then with NE off HR dropped to 29.  Droxidopa was eventually stopped. 06/13/2023: No events overnight, no chest pain or hypertension. Remains on Levophed.  Decrease Midodrine to 5 mg TID and restart Droxidopa, increase Florinef and stop Solu-cortef in attempts to wean Levophed off. Obtain CT Chest to rule out Pulmonary pathology for etiology of PH. 06/14/2023: chest pain yesterday. Re-started on levophed over night. CT shows no acute finding of ILD. Droxidopa re-started yesterday. 06/15/2023: completed dialysis yesterday. Re-started again on levophed.   Interim History / Subjective:  -Pt is sleepy  -Levophed re-started overnight  Objective:  Blood pressure (!)  119/31, pulse 77, temperature 98 F (36.7 C), temperature source Oral, resp. rate 14, height 5\' 4"  (1.626 m), weight 120.8 kg, SpO2 99%.        Intake/Output Summary (Last 24 hours) at 06/15/2023 0934 Last data filed at 06/15/2023 0400 Gross per 24 hour  Intake 895.03 ml  Output 1500 ml  Net -604.97 ml   Filed Weights   06/14/23 1518 06/14/23 1928 06/15/23 0500  Weight: 118.1 kg 116.5 kg 120.8 kg   Physical Examination: General: Chronically ill-appearing elderly woman in NAD. Pleasant and conversant, eating breakfast. HEENT: [place]/AT, anicteric sclera, PERRL, dry mucous membranes. Neuro: Awake, oriented x 4. Responds to verbal stimuli. Following commands consistently. Moves all 4 extremities spontaneously. Generalized weakness. CV: Variable HR, irregularly irregular rhythm, no m/g/r. PULM: Breathing even and unlabored on 2LNC. Lung fields CTAB in upper fields, faint bibasilar crackles. GI: Soft, nontender, nondistended. Normoactive bowel sounds. Extremities: No significant LE edema noted. Skin: Warm/dry, no rashes.  Resolved Hospital Problem List:  Assessment & Plan:   #Circulatory Shock: Suspect Cardiogenic given Bradycardia, improving #Afib with slow ventricular rate, improved #Pulmonary Hypertension #Moderate Tricuspid Regurgitation History of CAD, HLD, permanent A. Fib, chronic hypotension with SBP in 79's on Midodrine 10 mg TID Echo 7/7 with LVEF 60-65%, indeterminate diastolic parameters, RV systolic function mildly reduced, severe pulmonary hypertension, moderate tricuspid regurgitation. TSH/T4 WNL.  LHC/RHC 7/10 with mild-moderate non-obstructive CAD, patent proximal RCA stent, mild-moderately elevated L heart filling pressures, severely elevated R heart/pulm artery pressures, mildly reduced CO/CI. - Goal MAP > 65 - Avoiding fluid resuscitation in the setting of ESRD/HD - Levophed titrated to goal MAP; does best with low doses/not completely off-titrated; becomes  bradycardic when NE completely off - Continue Florinef and increased midodrine - Cardiac monitoring - Optimized electrolytes for K > 4, Mg > 2 - Heparin gtt for now, transition back to Eliquis as able - Secondary prevention with Zetia, Crestor - Appreciate Cardiology/EP input, less likely HOHF due to CI 2.4 on cath - Consideration for leadless PM placement on hold - Deemed not a good candiate for valve repair or MitraClip given degree of PH and RV dysfunction - Unlikely to tolerate PH management - CT showing no evidence of ILD - Continue droxidopa and levophed as needed  #Initial Concern for Sepsis of unknown etiology ~ LOW SUSPICION FOR SEPSIS No clear sign of sepsis though patient is hypotensive. Blood cultures are drawn and are negative to date.  CXR without sign of pneumonia, unable to make urine - Trend WBC, fever curve; leukocytosis improved - Follow Cx data, 7/6 BCx NGTD - Off of antibiotics, low threshold for resumption if WBC uptrends/patient becomes febrile  #ESRD on HD  #Hyponatremia #AG Metabolic Acidosis Outpatient - on HD TThS via LUE AVF); dry weight 115kg. 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 Doubt patient would be able to  tolerate PH treatment. No significant CAD to contribute to bradycardia. Less likely HOHF in the setting of CI 2.4.  - PMT consulted, appreciate assistance; will need ongoing GOC discussions if patient unable to tolerate more aggressive interventions   Best Practice (right click and "Reselect all SmartList Selections" daily)   Diet/type: Regular DVT prophylaxis: Heparin gtt GI prophylaxis: N/A Lines: Right radial A-line, and is still needed Foley:  N/A Code Status:  full code Last date of multidisciplinary goals of care discussion [7/12 - patient updated at bedside]  Labs   CBC: Recent Labs  Lab 06/11/23 1722 06/12/23 0405 06/13/23 0604 06/14/23 0753 06/15/23 0438  WBC 7.8 9.2 7.8 12.9* 12.5*  HGB 10.2* 10.6* 10.3* 10.1* 10.0*  HCT 32.6* 34.0* 32.2* 31.9* 32.2*  MCV 98.8 98.6 97.9 98.5 99.1  PLT 110* 122* 107* 140* 148*  Basic Metabolic Panel: Recent Labs  Lab 06/09/23 0350 06/10/23 0445 06/11/23 1722 06/12/23 0211 06/13/23 6045  06/14/23 0405 06/15/23 0438  NA 131*   < > 131* 131* 132* 131* 132*  K 3.8   < > 3.8 3.3* 4.1 4.6 3.8  CL 91*   < > 95* 92* 94* 94* 96*  CO2 23   < > 24 27 27 26 27   GLUCOSE 219*   < > 118* 202* 243* 167* 206*  BUN 72*   < > 46* 29* 33* 56* 38*  CREATININE 9.89*   < > 6.22* 4.80* 4.60* 5.56* 4.10*  CALCIUM 7.2*   < > 7.1* 7.6* 7.7* 7.8* 7.8*  MG 1.8  --  1.5* 2.0 2.2  --  1.8  PHOS 6.0*   < > 3.7 2.6 1.9* 2.2* 2.2*   < > = values in this interval not displayed.  GFR: Estimated Creatinine Clearance: 14.2 mL/min (A) (by C-G formula based on SCr of 4.1 mg/dL (H)). Recent Labs  Lab 06/09/23 1238 06/09/23 1739 06/10/23 0445 06/12/23 0405 06/13/23 0604 06/14/23 0753 06/15/23 0438  WBC 11.2*  --    < > 9.2 7.8 12.9* 12.5*  LATICACIDVEN 0.7 0.6  --   --   --   --   --    < > = values in this interval not displayed.  Liver Function Tests: Recent Labs  Lab 06/10/23 0445 06/11/23 0454 06/12/23 0211 06/14/23 0405 06/15/23 0438   ALBUMIN 3.0* 2.9* 2.9* 2.9* 2.9*   No results for input(s): "LIPASE", "AMYLASE" in the last 168 hours.  No results for input(s): "AMMONIA" in the last 168 hours.  ABG:    Component Value Date/Time   PHART 7.313 (L) 06/11/2023 1237   PCO2ART 48.7 (H) 06/11/2023 1237   PO2ART 90 06/11/2023 1237   HCO3 24.7 06/11/2023 1237   TCO2 26 06/11/2023 1237   ACIDBASEDEF 2.0 06/11/2023 1237   O2SAT 96 06/11/2023 1237   Coagulation Profile: No results for input(s): "INR", "PROTIME" in the last 168 hours.  Cardiac Enzymes: No results for input(s): "CKTOTAL", "CKMB", "CKMBINDEX", "TROPONINI" in the last 168 hours.  HbA1C: Hemoglobin A1C  Date/Time Value Ref Range Status  06/12/2021 12:00 AM 6.6  Final   Hgb A1c MFr Bld  Date/Time Value Ref Range Status  06/07/2023 07:44 PM 5.8 (H) 4.8 - 5.6 % Final    Comment:    (NOTE) Pre diabetes:          5.7%-6.4%  Diabetes:              >6.4%  Glycemic control for   <7.0% adults with diabetes   09/29/2022 04:55 PM 5.2 4.8 - 5.6 % Final    Comment:    (NOTE) Pre diabetes:          5.7%-6.4%  Diabetes:              >6.4%  Glycemic control for   <7.0% adults with diabetes    CBG: Recent Labs  Lab 06/14/23 1921 06/14/23 2157 06/14/23 2311 06/15/23 0341 06/15/23 0831  GLUCAP 137* 167* 189* 183* 141*   Past Medical History:  She,  has a past medical history of Arthritis, CAD (coronary artery disease), Chronic lower back pain, Diverticulosis, ESRD on dialysis (HCC), GERD (gastroesophageal reflux disease), Hiatal hernia, History of echocardiogram, Hyperlipidemia, Hypertension, Morbid obesity (HCC), Obesity, Persistent atrial fibrillation (HCC), Pneumonia, and Type II diabetes mellitus (HCC).   Surgical History:   Past Surgical History:  Procedure Laterality Date   ABDOMINAL HYSTERECTOMY     AV FISTULA PLACEMENT Left 01/27/2014   Procedure:  ARTERIOVENOUS (AV) FISTULA CREATION;  Surgeon: Pryor Ochoa, MD;  Location: [facility] OR;   Service: Vascular;  Laterality: Left;   AV FISTULA REPAIR     "had it cleaned out"   CARDIAC CATHETERIZATION  ?1980s   CARDIAC CATHETERIZATION N/A 12/04/2016   Procedure: Left Heart Cath Angiography;  Surgeon: Kathleene Hazel, MD;  Location: [facility] INVASIVE CV LAB;  Service: Cardiovascular;  Laterality: N/A;   CARDIAC CATHETERIZATION N/A 12/04/2016   Procedure: Coronary Stent Intervention;  Surgeon: Kathleene Hazel, MD;  Location: [facility] INVASIVE CV LAB;  Service: Cardiovascular;  Laterality: N/A;  Mid RCA   CATARACT EXTRACTION W/ INTRAOCULAR LENS IMPLANT Left    COLONOSCOPY W/ BIOPSIES AND POLYPECTOMY     CORONARY ANGIOPLASTY WITH STENT PLACEMENT  12/04/2016   ESOPHAGOGASTRODUODENOSCOPY     INSERTION OF DIALYSIS CATHETER N/A 01/19/2014   Procedure: INSERTION OF DIALYSIS CATHETER;  Surgeon: Larina Earthly, MD;  Location: [facility] OR;  Service: Vascular;  Laterality: N/A;   RIGHT/LEFT HEART CATH AND CORONARY ANGIOGRAPHY N/A 06/11/2023   Procedure: RIGHT/LEFT HEART CATH AND CORONARY ANGIOGRAPHY;  Surgeon: Yvonne Kendall, MD;  Location: [facility] INVASIVE CV LAB;  Service: Cardiovascular;  Laterality: N/A;    Social History:   reports that she quit smoking about 39 years ago. Her smoking use included cigarettes. She started smoking about 59 years ago. She has a 20 pack-year smoking history. She has never used smokeless tobacco. She reports that she does not drink alcohol and does not use drugs.   Family History:  Her family history includes Breast cancer in her sister; CAD in her father and sister; Cancer in her brother; Diabetes in her mother; Heart attack in her father; Heart disease in her father; Hypertension in her father; Peripheral vascular disease in her mother.   Allergies Allergies  Allergen Reactions   Sulfa Antibiotics Other (See Comments)   Sulfa Drugs Cross Reactors Other (See Comments)    Doesn't remember     Home Medications  Prior to Admission medications   Medication Sig Start  Date End Date Taking? Authorizing Provider  acetaminophen (TYLENOL) 650 MG CR tablet Take 1,300 mg by mouth every 8 (eight) hours as needed for pain.    [provider]  albuterol (VENTOLIN HFA) 108 (90 Base) MCG/ACT inhaler Inhale 1-2 puffs into the lungs every 6 (six) hours as needed for wheezing or shortness of breath. 12/15/21   Franne Forts, DO  apixaban (ELIQUIS) 5 MG TABS tablet Take 1 tablet (5 mg total) by mouth 2 (two) times daily. 10/02/22   Tresa Moore, MD  B Complex-C-Zn-Folic Acid (DIALYVITE/ZINC) TABS Take 1 tablet by mouth daily.    [provider]  bisacodyl (DULCOLAX) 5 MG EC tablet Take 2 tablets (10 mg total) by mouth daily as needed for moderate constipation. 10/02/22   Tresa Moore, MD  cinacalcet (SENSIPAR) 30 MG tablet Take 30 mg by mouth daily with supper.    [provider]  ezetimibe (ZETIA) 10 MG tablet Take 1 tablet (10 mg total) by mouth daily. 04/30/22   Iran Ouch, MD  ferric citrate (AURYXIA) 1 GM 210 MG(Fe) tablet Take 420 mg by mouth 3 (three) times daily with meals.    [provider]  fexofenadine (ALLEGRA) 180 MG tablet Take 180 mg by mouth daily.    [provider]  midodrine (PROAMATINE) 10 MG tablet Take 20 mg by mouth daily. (Take additional two tablets [20mg ] for administration at dialysis on Tuesday, Thursday and  Saturday)    [provider]  midodrine (PROAMATINE) 10 MG tablet Take 20 mg by mouth daily as needed (for SBP <100).    [provider]  Omega-3 Fatty Acids (FISH OIL) 1000 MG CPDR Take 1,000 mg by mouth daily.    [provider]  omeprazole (PRILOSEC) 20 MG capsule Take 1 capsule (20 mg total) by mouth daily. Patient taking differently: Take 20 mg by mouth 2 (two) times daily. 02/21/22   Arnette Felts, FNP  ondansetron (ZOFRAN-ODT) 4 MG disintegrating tablet Take 4 mg by mouth every 8 (eight) hours as needed for nausea or vomiting.    [provider]  polyethylene glycol (MIRALAX / GLYCOLAX) 17 g packet Take 17 g by mouth at bedtime.    [provider]  pregabalin (LYRICA) 100 MG capsule Take 100 mg by mouth at bedtime.    [provider]  pregabalin (LYRICA) 50 MG capsule Take 50 mg by mouth 2 (two) times daily.    [provider]  rosuvastatin (CRESTOR) 10 MG tablet Take 1 tablet (10 mg total) by mouth daily. 10/03/22   Tresa Moore, MD  sennosides-docusate sodium (SENOKOT-S) 8.6-50 MG tablet Take 1 tablet by mouth 2 (two) times daily.    [provider]  trolamine salicylate (ASPERCREME) 10 % cream Apply 1 Application topically 2 (two) times daily. (Apply to knees)    [provider]    Critical care time:   The patient is critically ill with multiple organ system failure and requires high complexity decision making for assessment and support, frequent evaluation and titration of therapies, advanced monitoring, review of radiographic studies and interpretation of complex data.   Critical Care Time devoted to patient care services, exclusive of separately billable procedures, described in this note is 30 minutes.  Earley Brooke, MD  Pulmonary & Critical Care 06/15/23 9:34 AM  Please see [email or web address] for pager details. After hours, please call ELink [phone]

## 2023-06-15 NOTE — Plan of Care (Signed)
  Problem: Education: Goal: Knowledge of General Education information will improve Description: Including pain rating scale, medication(s)/side effects and non-pharmacologic comfort measures Outcome: Progressing   Problem: Clinical Measurements: Goal: Will remain free from infection Outcome: Progressing   Problem: Activity: Goal: Risk for activity intolerance will decrease Outcome: Progressing   Problem: Pain Managment: Goal: General experience of comfort will improve Outcome: Progressing   Problem: Safety: Goal: Ability to remain free from injury will improve Outcome: Progressing   Problem: Activity: Goal: Ability to return to baseline activity level will improve Outcome: Progressing   Problem: Clinical Measurements: Goal: Cardiovascular complication will be avoided Outcome: Not Progressing

## 2023-06-15 NOTE — Progress Notes (Signed)
[facility] PROGRESS NOTE  ASSESSMENT / PLAN: Jacqueline [name] is a 77 y.o. female with ESRD on HD TTS via left arm brachiocephalic arteriovenous fistula. The patient has had persistent low diastolic pressure and low systemic vascular resistance. Duplex ultrasound does not demonstrate high output AV fistula. I do not see any opportunity for improving the patient's hemodynamics by intervention on the fistula. Please call for questions.   SUBJECTIVE: Sleeping comfortably. No complaints.   OBJECTIVE: BP (!) 119/31 (BP Location: Other (Comment)) Comment (BP Location): right A-line  Pulse 77   Temp 98 F (36.7 C) (Oral)   Resp 14   Ht 5\' 4"  (1.626 m)   Wt 120.8 kg   SpO2 99%   BMI 45.71 kg/m   Intake/Output Summary (Last 24 hours) at 06/15/2023 0904 Last data filed at 06/15/2023 0400 Gross per 24 hour  Intake 895.03 ml  Output 1500 ml  Net -604.97 ml    No distress Left arm brachiocephalic arteriovenous fistula with smooth thrill     Latest Ref Rng & Units 06/15/2023    4:38 AM 06/14/2023    7:53 AM 06/13/2023    6:04 AM  CBC  WBC 4.0 - 10.5 K/uL 12.5  12.9  7.8   Hemoglobin 12.0 - 15.0 g/dL 16.1  09.6  04.5   Hematocrit 36.0 - 46.0 % 32.2  31.9  32.2   Platelets 150 - 400 K/uL 148  140  107         Latest Ref Rng & Units 06/15/2023    4:38 AM 06/14/2023    4:05 AM 06/13/2023    6:04 AM  CMP  Glucose 70 - 99 mg/dL 409  811  914   BUN 8 - 23 mg/dL 38  56  33   Creatinine 0.44 - 1.00 mg/dL 7.82  9.56  2.13   Sodium 135 - 145 mmol/L 132  131  132   Potassium 3.5 - 5.1 mmol/L 3.8  4.6  4.1   Chloride 98 - 111 mmol/L 96  94  94   CO2 22 - 32 mmol/L 27  26  27    Calcium 8.9 - 10.3 mg/dL 7.8  7.8  7.7     Estimated Creatinine Clearance: 14.2 mL/min (A) (by C-G formula based on SCr of 4.1 mg/dL (H)).  [name]. [name], MD [facility] Phone Number: [phone] 06/15/2023 9:04 AM

## 2023-06-15 NOTE — Progress Notes (Signed)
[facility]  ROUNDING NOTE   Subjective:   Hemodialysis treatment yesterday. Tolerated treatment well. UF of 1.5 liters.  Remains on norepinephrine gtt.   Objective:  Vital signs in last 24 hours:  Temp:  [97.9 F (36.6 C)-99.1 F (37.3 C)] 98 F (36.7 C) (07/14 0200) Pulse Rate:  [57-85] 77 (07/14 0600) Resp:  [12-24] 14 (07/14 0730) BP: (71-132)/(31-43) 119/31 (07/13 1914) SpO2:  [90 %-100 %] 99 % (07/14 0600) Arterial Line BP: (78-134)/(18-42) 116/37 (07/14 0730) Weight:  [116.5 kg-120.8 kg] 120.8 kg (07/14 0500)  Weight change: 0 kg Filed Weights   06/14/23 1518 06/14/23 1928 06/15/23 0500  Weight: 118.1 kg 116.5 kg 120.8 kg    Intake/Output: I/O last 3 completed shifts: In: 1217.6 [P.O.:550; I.V.:667.6] Out: 3000 [Other:3000]   Intake/Output this shift:  No intake/output data recorded.  Physical Exam: General: NAD, laying in bed  Head: Normocephalic, atraumatic. Moist oral mucosal membranes  Eyes: Anicteric  Lungs:  Clear to auscultation, normal effort  Heart: Regular rate and rhythm  Abdomen:  Soft, nontender,   Extremities: no peripheral edema.  Neurologic: Alert and oriented, moving all four extremities  Skin: No lesions  Access: Lt AVF    Basic Metabolic Panel: Recent Labs  Lab 06/09/23 0350 06/10/23 0445 06/11/23 1722 06/12/23 0211 06/13/23 0604 06/14/23 0405 06/15/23 0438  NA 131*   < > 131* 131* 132* 131* 132*  K 3.8   < > 3.8 3.3* 4.1 4.6 3.8  CL 91*   < > 95* 92* 94* 94* 96*  CO2 23   < > 24 27 27 26 27   GLUCOSE 219*   < > 118* 202* 243* 167* 206*  BUN 72*   < > 46* 29* 33* 56* 38*  CREATININE 9.89*   < > 6.22* 4.80* 4.60* 5.56* 4.10*  CALCIUM 7.2*   < > 7.1* 7.6* 7.7* 7.8* 7.8*  MG 1.8  --  1.5* 2.0 2.2  --  1.8  PHOS 6.0*   < > 3.7 2.6 1.9* 2.2* 2.2*   < > = values in this interval not displayed.    Liver Function Tests: Recent Labs  Lab 06/10/23 0445 06/11/23 0454 06/12/23 0211 06/14/23 0405 06/15/23 0438   ALBUMIN 3.0* 2.9* 2.9* 2.9* 2.9*   No results for input(s): "LIPASE", "AMYLASE" in the last 168 hours.  No results for input(s): "AMMONIA" in the last 168 hours.  CBC: Recent Labs  Lab 06/11/23 1722 06/12/23 0405 06/13/23 0604 06/14/23 0753 06/15/23 0438  WBC 7.8 9.2 7.8 12.9* 12.5*  HGB 10.2* 10.6* 10.3* 10.1* 10.0*  HCT 32.6* 34.0* 32.2* 31.9* 32.2*  MCV 98.8 98.6 97.9 98.5 99.1  PLT 110* 122* 107* 140* 148*    Cardiac Enzymes: No results for input(s): "CKTOTAL", "CKMB", "CKMBINDEX", "TROPONINI" in the last 168 hours.  BNP: Invalid input(s): "POCBNP"  CBG: Recent Labs  Lab 06/14/23 1921 06/14/23 2157 06/14/23 2311 06/15/23 0341 06/15/23 0831  GLUCAP 137* 167* 189* 183* 141*    Microbiology: Results for orders placed or performed during the hospital encounter of 06/07/23  Blood culture (routine x 2)     Status: None   Collection Time: 06/07/23 12:54 PM   Specimen: BLOOD  Result Value Ref Range Status   Specimen Description BLOOD RIGHT ANTECUBITAL  Final   Special Requests   Final    BOTTLES DRAWN AEROBIC AND ANAEROBIC Blood Culture adequate volume   Culture   Final    NO GROWTH 5 DAYS Performed at [facility]  Lab, [address] [place], [place] [address]    Report Status 06/12/2023 FINAL  Final  Blood culture (routine x 2)     Status: None   Collection Time: 06/07/23 12:54 PM   Specimen: BLOOD  Result Value Ref Range Status   Specimen Description BLOOD BLOOD RIGHT FOREARM  Final   Special Requests   Final    BOTTLES DRAWN AEROBIC AND ANAEROBIC Blood Culture adequate volume   Culture   Final    NO GROWTH 5 DAYS Performed at [facility], [address], [place], [place] [address]    Report Status 06/12/2023 FINAL  Final  MRSA Next Gen by PCR, Nasal     Status: None   Collection Time: 06/07/23 10:18 PM   Specimen: Nasal Mucosa; Nasal Swab  Result Value Ref Range Status   MRSA by PCR Next Gen NOT DETECTED NOT DETECTED Final     Comment: (NOTE) The GeneXpert MRSA Assay (FDA approved for NASAL specimens only), is one component of a comprehensive MRSA colonization surveillance program. It is not intended to diagnose MRSA infection nor to guide or monitor treatment for MRSA infections. Test performance is not FDA approved in patients less than 71 years old. Performed at [facility], [address] [place], [place] [address]     Coagulation Studies: No results for input(s): "LABPROT", "INR" in the last 72 hours.  Urinalysis: No results for input(s): "COLORURINE", "LABSPEC", "PHURINE", "GLUCOSEU", "HGBUR", "BILIRUBINUR", "KETONESUR", "PROTEINUR", "UROBILINOGEN", "NITRITE", "LEUKOCYTESUR" in the last 72 hours.  Invalid input(s): "APPERANCEUR"    Imaging: [place] Dialysis Access  Result Date: 06/14/2023 CLINICAL DATA:  ESRD with indwelling left brachiocephalic AV fistula. Persistent hypotension on dialysis with suspected high-volume flows via the AV fistula. EXAM: ULTRASOUND DIALYSIS ACCESS TECHNIQUE: Limited duplex evaluation of the left brachiocephalic AV fistula was performed including color Doppler evaluation and sampling in portions of the arterial inflow and cephalic venous outflow. COMPARISON:  None Available. FINDINGS: Brachial artery inflow demonstrates normal arterial waveform and normal patency. Arterial anastomosis is patent. Normally patent flow is noted in a mature cephalic vein without significantly elevated velocities to suggest extremely high flow. Maximal measured velocity in the venous outflow was less than 40 centimeters/second. Flow is not very turbulent. The entire venous outflow was not assessed. IMPRESSION: Patent left brachiocephalic AV fistula with normal arterial inflow and patent assessed cephalic venous outflow. The assess cephalic venous outflow velocities and waveforms do not suggest extremely high flow. Electronically Signed   By: Irish Lack M.D.   On: 06/14/2023 08:38      Medications:    sodium chloride Stopped (06/10/23 1900)   sodium chloride     anticoagulant sodium citrate     heparin 1,250 Units/hr (06/15/23 0400)   norepinephrine (LEVOPHED) Adult infusion 6 mcg/min (06/15/23 0437)    Chlorhexidine Gluconate Cloth  6 each Topical Daily   droxidopa  100 mg Oral TID WC   ezetimibe  10 mg Oral Daily   feeding supplement (NEPRO CARB STEADY)  237 mL Oral TID BM   fludrocortisone  0.2 mg Oral Daily   insulin aspart  0-20 Units Subcutaneous TID WC   insulin aspart  0-5 Units Subcutaneous QHS   insulin glargine-yfgn  5 Units Subcutaneous Daily   loratadine  10 mg Oral Daily   midodrine  10 mg Oral TID WC   multivitamin  1 tablet Oral QHS   pregabalin  100 mg Oral QHS   pregabalin  50 mg Oral BID  rosuvastatin  10 mg Oral Daily   senna-docusate  1 tablet Oral BID   sodium chloride flush  3 mL Intravenous Q12H   sodium chloride, acetaminophen, alteplase, alum & mag hydroxide-simeth, anticoagulant sodium citrate, bisacodyl, docusate sodium, guaiFENesin-dextromethorphan, heparin, HYDROcodone-acetaminophen, lidocaine (PF), lidocaine-prilocaine, morphine injection, nitroGLYCERIN, ondansetron (ZOFRAN) IV, mouth rinse, pentafluoroprop-tetrafluoroeth, polyethylene glycol, sodium chloride flush  Assessment/ Plan:  [name] is a 77 y.o.  female  with a PMHx of ESRD on HD TTS at [facility], diabetes mellitus type 2, chronic hypotension, peripheral arterial disease, obstructive sleep apnea, GERD, obesity, hyperlipidemia, anemia chronic kidney disease, secondary hyperparathyroidism, who was admitted to [facility] on 06/07/2023 for evaluation of generalized weakness.   1  ESRD on HD TTS: remains on norepinephrine.  - daily dialysis to get to optimum volume status.  - Then resume TTS schedule.   2.  Hypotension with severe pulmonary hypertension..  Chronic in nature.  Systolic blood pressure usually in the 80s as an outpatient.   - midodrine 10  mg 3 times daily as outpatient.    - remains requiring vasopressors: norepinephrine  3. Anemia of chronic kidney disease Lab Results  Component Value Date   HGB 10.0 (L) 06/15/2023    Continue Mircera as an outpatient.  4. Diabetes mellitus type II with chronic kidney disease/renal manifestations: noninsulin dependent.  Most recent hemoglobin A1c is 5.8 on 06/07/23.  - continue glucose control.   5. Secondary Hyperparathyroidism:   with hypocalcemia and hypophosphatemia.   Lab Results  Component Value Date   PTH 388.6 (H) 01/17/2014   CALCIUM 7.8 (L) 06/15/2023   CAION 0.92 (L) 06/11/2023   PHOS 2.2 (L) 06/15/2023    Holding Auryxia and Sensipar     LOS: 8 Payton Prinsen 7/14/202410:14 AM

## 2023-06-15 NOTE — Consult Note (Signed)
PHARMACY CONSULT NOTE  Pharmacy Consult for Electrolyte Monitoring and Replacement   Recent Labs: Potassium (mmol/L)  Date Value  06/15/2023 3.8   Magnesium (mg/dL)  Date Value  16/09/9603 1.8   Calcium (mg/dL)  Date Value  54/08/8118 7.8 (L)   Albumin (g/dL)  Date Value  14/78/2956 2.9 (L)  10/10/2021 4.1   Phosphorus (mg/dL)  Date Value  21/30/8657 2.2 (L)   Sodium (mmol/L)  Date Value  06/15/2023 132 (L)  10/10/2021 142    Assessment: 77 y.o female with significant PMH of HTN, T2DM,ESRD on HD TTS, Chronic debility , CAD s/p stent, PAF,  OSA, PAD, Left knee arthritis, Chronic lower back pain,GERD,Obesity , HLD  who presented to the ED  from Dialysis with chief complaints of generalized weakness x 4 days.   Goal of Therapy:  electrolytes within normal limits  Plan:  --no electrolyte replacement warranted for today ---f/u with AM labs.    Clinical Pharmacist  06/15/2023 7:20 AM

## 2023-06-16 DIAGNOSIS — I959 Hypotension, unspecified: Secondary | ICD-10-CM | POA: Diagnosis not present

## 2023-06-16 LAB — GLUCOSE, CAPILLARY
Glucose-Capillary: 135 mg/dL — ABNORMAL HIGH (ref 70–99)
Glucose-Capillary: 124 mg/dL — ABNORMAL HIGH (ref 70–99)
Glucose-Capillary: 126 mg/dL — ABNORMAL HIGH (ref 70–99)
Glucose-Capillary: 129 mg/dL — ABNORMAL HIGH (ref 70–99)
Glucose-Capillary: 146 mg/dL — ABNORMAL HIGH (ref 70–99)
Glucose-Capillary: 175 mg/dL — ABNORMAL HIGH (ref 70–99)
Glucose-Capillary: 146 mg/dL — ABNORMAL HIGH (ref 70–99)

## 2023-06-16 LAB — CBC
HCT: 30.4 % — ABNORMAL LOW (ref 36.0–46.0)
Hemoglobin: 9.7 g/dL — ABNORMAL LOW (ref 12.0–15.0)
MCH: 31.6 pg (ref 26.0–34.0)
MCHC: 31.9 g/dL (ref 30.0–36.0)
MCV: 99 fL (ref 80.0–100.0)
Platelets: 153 10*3/uL (ref 150–400)
RBC: 3.07 MIL/uL — ABNORMAL LOW (ref 3.87–5.11)
RDW: 15 % (ref 11.5–15.5)
WBC: 12 10*3/uL — ABNORMAL HIGH (ref 4.0–10.5)
nRBC: 0 % (ref 0.0–0.2)

## 2023-06-16 LAB — RENAL FUNCTION PANEL
Albumin: 3.1 g/dL — ABNORMAL LOW (ref 3.5–5.0)
Anion gap: 9 (ref 5–15)
BUN: 57 mg/dL — ABNORMAL HIGH (ref 8–23)
CO2: 27 mmol/L (ref 22–32)
Calcium: 7.8 mg/dL — ABNORMAL LOW (ref 8.9–10.3)
Chloride: 94 mmol/L — ABNORMAL LOW (ref 98–111)
Creatinine, Ser: 5.64 mg/dL — ABNORMAL HIGH (ref 0.44–1.00)
GFR, Estimated: 7 mL/min — ABNORMAL LOW (ref >60)
Glucose, Bld: 132 mg/dL — ABNORMAL HIGH (ref 70–99)
Phosphorus: 3.9 mg/dL (ref 2.5–4.6)
Potassium: 4.5 mmol/L (ref 3.5–5.1)
Sodium: 130 mmol/L — ABNORMAL LOW (ref 135–145)

## 2023-06-16 LAB — HEPARIN LEVEL (UNFRACTIONATED): Heparin Unfractionated: 0.4 IU/mL (ref 0.30–0.70)

## 2023-06-16 LAB — BRAIN NATRIURETIC PEPTIDE: B Natriuretic Peptide: 664.8 pg/mL — ABNORMAL HIGH (ref 0.0–100.0)

## 2023-06-16 LAB — MAGNESIUM: Magnesium: 1.8 mg/dL (ref 1.7–2.4)

## 2023-06-16 MED ORDER — FLUDROCORTISONE ACETATE 0.1 MG PO TABS
0.2000 mg | ORAL_TABLET | Freq: Two times a day (BID) | ORAL | Status: DC
  Administered 2023-06-16 – 2023-07-01 (×29): 0.2 mg via ORAL
  Filled 2023-06-16 (×29): qty 2

## 2023-06-16 NOTE — Progress Notes (Signed)
ANTICOAGULATION CONSULT NOTE  Pharmacy Consult for IV heparin Indication: atrial fibrillation  Allergies  Allergen Reactions   Sulfa Antibiotics Other (See Comments)   Sulfa Drugs Cross Reactors Other (See Comments)    Doesn't remember     Patient Measurements: Height: 5\' 4"  (162.6 cm) Weight: 120.8 kg (266 lb 5.1 oz) IBW/kg (Calculated) : 54.7 Heparin Dosing Weight: 115.5 kg  Vital Signs: Temp: 98.3 F (36.8 C) (07/15 0400) Temp Source: Oral (07/15 0400) Pulse Rate: 68 (07/15 0200)  Labs: Recent Labs    06/13/23 0604 06/13/23 0951 06/13/23 1729 06/13/23 1923 06/14/23 0345 06/14/23 0405 06/14/23 0753 06/14/23 1327 06/14/23 2201 06/15/23 0438 06/16/23 0414  HGB 10.3*  --   --   --   --   --  10.1*  --   --  10.0* 9.7*  HCT 32.2*  --   --   --   --   --  31.9*  --   --  32.2* 30.4*  PLT 107*  --   --   --   --   --  140*  --   --  148* 153  APTT  --  78*  --   --   --   --   --   --   --   --   --   HEPARINUNFRC  --   --  0.80*  --    < >  --   --    < > 0.56 0.44 0.40  CREATININE 4.60*  --   --   --   --  5.56*  --   --   --  4.10*  --   TROPONINIHS  --   --  44* 43*  --   --   --   --   --   --   --    < > = values in this interval not displayed.    Estimated Creatinine Clearance: 14.2 mL/min (A) (by C-G formula based on SCr of 4.1 mg/dL (H)).   Medical History: Past Medical History:  Diagnosis Date   Arthritis    "left knee" (12/04/2016)   CAD (coronary artery disease)    a. 12/2016 NSTEMI/PCI: LM nl, LaD 20p/d, D1 20, OM2 20, OM3 20, RCA 78m (3.0x18 Resolute DES). EF 65%.   Chronic lower back pain    Diverticulosis    on CT   ESRD on dialysis [facility])    GERD (gastroesophageal reflux disease)    Hiatal hernia    History of echocardiogram    a. 09/2022 Echo: EF 60-65%, mild asymm LVH, mod reduced RV fxn, RVSP . Mildly dil RA. Mild AI. Ao sclerosis. Mild MR.   Hyperlipidemia    Hypertension    a. 12/2016 labetalol d/c'd 2/2 hypotension in HD.    Morbid obesity (HCC)    Obesity    Persistent atrial fibrillation (HCC)    a. 05/2023 Zio: 100% afib avg HR 52 (32-99).   Pneumonia    "couple times" (12/04/2016)   Type II diabetes mellitus (HCC)     Medications:  Eliquis prior to admission (last dose 7/7 @2100 )  Assessment: 77 year old female admitted with circulatory shock, of which has improved. Plan is for PPM, and thus holding Eliquis prior to procedure.   Goal of Therapy:  anti-Xa 0.3-0.7 Monitor platelets by anticoagulation protocol: Yes    Plan: anti-Xa level therapeutic x 3 continue heparin infusion at 1250 units/hr Recheck  HL daily w/ AM labs while therapeutic  anti-Xa & CBC at least once daily while on heparin  Otelia Sergeant, PharmD, [facility] 06/16/2023 4:52 AM

## 2023-06-16 NOTE — Consult Note (Signed)
PHARMACY CONSULT NOTE  Pharmacy Consult for Electrolyte Monitoring and Replacement   Recent Labs: Potassium (mmol/L)  Date Value  06/16/2023 4.5   Magnesium (mg/dL)  Date Value  29/52/8413 1.8   Calcium (mg/dL)  Date Value  24/40/1027 7.8 (L)   Albumin (g/dL)  Date Value  25/36/6440 3.1 (L)  10/10/2021 4.1   Phosphorus (mg/dL)  Date Value  34/74/2595 3.9   Sodium (mmol/L)  Date Value  06/16/2023 130 (L)  10/10/2021 142    Assessment: 77 y.o female with significant PMH of HTN, T2DM,ESRD on HD TTS, Chronic debility , CAD s/p stent, PAF,  OSA, PAD, Left knee arthritis, Chronic lower back pain,GERD,Obesity , HLD  who presented to the ED  from Dialysis with chief complaints of generalized weakness x 4 days.   Goal of Therapy:  electrolytes within normal limits  Plan:  --no electrolyte replacement warranted for today ---f/u with AM labs.    Clinical Pharmacist  06/16/2023 9:36 AM

## 2023-06-16 NOTE — Plan of Care (Signed)
  Problem: Clinical Measurements: Goal: Will remain free from infection Outcome: Progressing Goal: Respiratory complications will improve Outcome: Progressing   Problem: Nutrition: Goal: Adequate nutrition will be maintained Outcome: Progressing   Problem: Coping: Goal: Level of anxiety will decrease Outcome: Progressing   Problem: Pain Managment: Goal: General experience of comfort will improve Outcome: Progressing   Problem: Safety: Goal: Ability to remain free from injury will improve Outcome: Progressing   Problem: Elimination: Goal: Will not experience complications related to bowel motility Outcome: Not Progressing

## 2023-06-16 NOTE — Progress Notes (Signed)
Patient Name: Jacqueline [name] Date of Encounter: 06/16/2023 [facility] Cardiologist: Lorine Bears, MD   Interval Summary  .    Feeling OK.  No specific complaints.  The dose of norepinephrine drip was decreased to 2 mics and her blood pressure is on the high side.  Vital Signs .    Vitals:   06/16/23 0600 06/16/23 0700 06/16/23 0800 06/16/23 1045  BP:      Pulse: 65 61 69   Resp: 14 14 14 14   Temp:   98.6 F (37 C)   TempSrc:   Axillary   SpO2: 97% 95% 95%   Weight:      Height:        Intake/Output Summary (Last 24 hours) at 06/16/2023 1142 Last data filed at 06/16/2023 1009 Gross per 24 hour  Intake 926.53 ml  Output --  Net 926.53 ml      06/16/2023    5:00 AM 06/15/2023    5:00 AM 06/14/2023    7:28 PM  Last 3 Weights  Weight (lbs) 264 lb 15.9 oz 266 lb 5.1 oz 256 lb 13.4 oz  Weight (kg) 120.2 kg 120.8 kg 116.5 kg      Telemetry/ECG    Sinus bradycardia.  Heart rate ranging from the 40s to the 60s.- Personally Reviewed  Physical Exam .    VS:  BP (!) 119/31 (BP Location: Other (Comment)) Comment (BP Location): right A-line  Pulse 69   Temp 98.6 F (37 C) (Axillary)   Resp 14   Ht 5\' 4"  (1.626 m)   Wt 120.2 kg   SpO2 95%   BMI 45.49 kg/m  , BMI Body mass index is 45.49 kg/m. GENERAL: Chronically ill-appearing.  No acute distress. HEENT: Pupils equal round and reactive, fundi not visualized, oral mucosa unremarkable NECK:  No jugular venous distention, waveform within normal limits, carotid upstroke brisk and symmetric, no bruits, no thyromegaly LUNGS:  Clear to auscultation bilaterally HEART: Bradycardic.  Regular rhythm.  PMI not displaced or sustained,S1 and S2 within normal limits, no S3, no S4, no clicks, no rubs, no murmurs ABD:  Flat, positive bowel sounds normal in frequency in pitch, no bruits, no rebound, no guarding, no midline pulsatile mass, no hepatomegaly, no splenomegaly EXT:  2 plus pulses throughout, no edema, no  cyanosis no clubbing SKIN:  No rashes no nodules NEURO:  Cranial nerves II through XII grossly intact, motor grossly intact throughout PSYCH:  Cognitively intact, oriented to person place and time   Assessment & Plan .     # Bradycardia: # Hypotension: Bradycardia does not seem to be causing hypotension and thus there is no benefit from a pacemaker.  She is on midodrine, fludrocortisone and droxidopa.  Recommend stopping norepinephrine drip to see how her blood pressure is now on these 3 medications. Given her low diastolic BP there was concern for low SVR 2/2 fistula and shunting. Howevre, on ultrasound 06/13/23, flow was found to be in acceptable range.    # CAD s/p RCA PCI: # Hyperlipidemia: Cath this admission showed patent RCA stent and mild-mod non-obstructive CAD.  Severely elevated R heart and pulmonary pressures.  Continue rosuvastatin and Zetia.  # Severe pulmonary HTN: LV function is normal and RV moderately reduced on echo.  RHC demonstrated severely elevated pulmonary pressure as below.  Pulmonary pressure did not exceed systemic pressure.  Volume management challenging given her hypotension.  Volume is managed by nephrology and dialysis. We did discuss with advanced heart  failure to see if she is a candidate for pulmonary hypertension treatment but given that she is on dialysis, she is not a candidate.  # ESRD on HD: Per nephrology  # Persistent atrial fibrillation:  Bradycardic as above.  Therefore not on any nodal agents.  She remains on IV heparin.  She can be switched back to Eliquis from our standpoint.  # Mod-severe TR:  Not a good candidate for repair given RV dysfuction and pulmonary HTN.  # OSA: Patient denies diagnosis of OSA and reports that she does not use a CPAP at home.  Consider outpatient sleep study.      For questions or updates, please contact [facility] Please consult [email or web address] for contact info under        Signed, Lorine Bears, MD

## 2023-06-16 NOTE — TOC Progression Note (Addendum)
Transition of Care [facility]) - Progression Note    Patient Details  Name: Jacqueline [name] MRN: [id number] Date of Birth: Mar 26, 1946  Transition of [facility]) CM/SW Contact  Kreg Shropshire, RN Phone Number: 06/16/2023, 9:28 AM  Clinical Narrative:    Received message from Tammy at [facility] stated she thought pt was going to be admitted over the weekend back to [facility]. Cm reached out to Dr for more information.  1121-updated Tammy at [facility] about no expected d/c date at this time.   Expected Discharge Plan: Long Term Nursing Home Barriers to Discharge: Continued Medical Work up  Expected Discharge Plan and Services       Living arrangements for the past 2 months: Skilled Nursing Facility Expected Discharge Date: 06/10/23                                     Social Determinants of Health (SDOH) Interventions SDOH Screenings   Food Insecurity: No Food Insecurity (06/07/2023)  Housing: Low Risk  (06/07/2023)  Transportation Needs: No Transportation Needs (06/07/2023)  Utilities: Not At Risk (06/07/2023)  Depression (PHQ2-9): Low Risk  (06/27/2021)  Financial Resource Strain: Medium Risk (06/27/2021)  Physical Activity: Inactive (06/27/2021)  Stress: No Stress Concern Present (06/27/2021)  Tobacco Use: Medium Risk (06/07/2023)    Readmission Risk Interventions    05/22/2022   12:26 PM  Readmission Risk Prevention Plan  Transportation Screening Complete  PCP or Specialist Appt within 3-5 Days Complete  HRI or Home Care Consult Complete  Social Work Consult for Recovery Care Planning/Counseling Complete  Palliative Care Screening Complete  Medication Review Oceanographer) Complete

## 2023-06-16 NOTE — Progress Notes (Signed)
NAME:  Jacqueline [name], MRN:  [id number], DOB:  09-12-1946, LOS: 9 ADMISSION DATE:  06/07/2023, CONSULTATION DATE:  06/07/2023 REFERRING MD:  Dr. Allena Katz, CHIEF COMPLAINT:  Hypotension   Brief Pt Description / Synopsis:  77 y.o woman with PMHx significant for chronic hypotension (SBP in 80's on Midodrine 10 mg TID) and ESRD on HD (TTS) admitted with Cardiogenic shock due to Bradycardia.   History of Present Illness:  77 y.o female with significant PMHx significant for HTN, T2DM, ESRD on HD TTS, Chronic debility, CAD s/p stent, PAF, OSA, PAD, Left knee arthritis, Chronic lower back pain, GERD, Obesity, HLD who presented to the ED from Dialysis with generalized weakness x 4 days.   EMS report: NVD and LLQ abdominal pain since last night. States tried to eat yesterday, each time she ate she vomited, then had diarrhea last night. No NVD today. She went to Dialysis today and was noted with low BP of 72/42. She usually runs lows BP and takes Midodrine but today her BP was lower than usually so she was not dialyzed but was sent to ED for further evaluation.   ED Course:  Initial vital signs showed HR of 50 beats/minute, BP 81/51 mm Hg, the RR 16 breaths/minute, and the oxygen saturation 91% on 2L and a temperature of 97.44F (36.6C).   Pertinent Labs/Diagnostics.   Na+/ K+: 133/3.4 Glucose:130 BUN/Cr.:55/7.76 WBC:11.5 Plts: 146 PCT: pending Lactic acid: 1.3 EKG:?Afib vs junctional bradycardia rate 40bmp. Incomplete RBB,   CXR, CT scan obtained and show no signs of infection or other abnormality. Patient given, midodrine, atropine for significant bradycardia and  500 cc of fluids and started on broad-spectrum antibiotics Vanco cefepime and Flagyl for  suspected sepsis of unknown source. Patient remained hypotensive despite  intervention as above. PCCM consulted for possible pressors.  Pertinent Medical History:   Past Medical History:  Diagnosis Date   Arthritis    "left knee" (12/04/2016)   CAD  (coronary artery disease)    a. 12/2016 NSTEMI/PCI: LM nl, LaD 20p/d, D1 20, OM2 20, OM3 20, RCA 39m (3.0x18 Resolute DES). EF 65%.   Chronic lower back pain    Diverticulosis    on CT   ESRD on dialysis [facility])    GERD (gastroesophageal reflux disease)    Hiatal hernia    History of echocardiogram    a. 09/2022 Echo: EF 60-65%, mild asymm LVH, mod reduced RV fxn, RVSP . Mildly dil RA. Mild AI. Ao sclerosis. Mild MR.   Hyperlipidemia    Hypertension    a. 12/2016 labetalol d/c'd 2/2 hypotension in HD.   Morbid obesity (HCC)    Obesity    Persistent atrial fibrillation (HCC)    a. 05/2023 Zio: 100% afib avg HR 52 (32-99).   Pneumonia    "couple times" (12/04/2016)   Type II diabetes mellitus (HCC)    Micro Data:  7/6: Blood culture x2>> negative 7/6: MRSA PCR >> negative  Antimicrobials:   Anti-infectives (From admission, onward)    Start     Dose/Rate Route Frequency Ordered Stop   06/10/23 1200  vancomycin (VANCOCIN) IVPB 1000 mg/200 mL premix  Status:  Discontinued       Placed in "Followed by" Linked Group   1,000 mg 200 mL/hr over 60 Minutes Intravenous Every T-Th-Sa (Hemodialysis) 06/08/23 1058 06/09/23 1029   06/08/23 1145  vancomycin (VANCOCIN) IVPB 1000 mg/200 mL premix       Placed in "Followed by" Linked Group   1,000 mg 200  mL/hr over 60 Minutes Intravenous  Once 06/08/23 1058 06/08/23 1655   06/08/23 1100  piperacillin-tazobactam (ZOSYN) IVPB 2.25 g  Status:  Discontinued        2.25 g 100 mL/hr over 30 Minutes Intravenous Every 8 hours 06/08/23 1058 06/09/23 1029   06/07/23 1400  metroNIDAZOLE (FLAGYL) IVPB 500 mg        500 mg 100 mL/hr over 60 Minutes Intravenous  Once 06/07/23 1346 06/07/23 1652   06/07/23 1400  ceFEPIme (MAXIPIME) 2 g in sodium chloride 0.9 % 100 mL IVPB        2 g 200 mL/hr over 30 Minutes Intravenous  Once 06/07/23 1349 06/07/23 1512   06/07/23 1400  vancomycin (VANCOCIN) IVPB 1000 mg/200 mL premix        1,000 mg 200 mL/hr over 60  Minutes Intravenous  Once 06/07/23 1349 06/07/23 1652      Significant Hospital Events: Including procedures, antibiotic start and stop dates in addition to other pertinent events   06/07/2023: admit to ICU with bradycardia, started on dopamine 06/08/2023: weaned off dopamine, hypotensive again, dopamine restarted 06/09/2023:  Remains hypotensive requiring Levophed, (pt is vasculopath, doubt accuracy of cuff BP as pt is awake, alert, and asymptomatic).  Attempted Arterial line but unsuccessful, Vascular Surgery consulted to assist with placement. Nephrology consulted, received HD. 06/10/2023: Continues to require Levophed (6 mcg currently), when Levo weaned down does exhibit some Bradycardia in the upper 50's.  Cardiology considering R Heart Cath today. Holding on transfer to [facility] for pacer at this time. 06/11/2023: Levophed weaning down, this AM. LHC/RHC with mild-moderate non-obstructive CAD, patent proximal RCA stent, mild-moderately elevated L heart filling pressures, severely elevated R heart/pulm artery pressures, mildly reduced CO/CI. Episode of chest pressure with HD, resolved with morphine. 06/12/2023: Additional episodes of chest tightness/pressure overnight (0150 and 0554). EKG without changes, trops flat. Hypertensive to 227/80, then with NE off HR dropped to 29.  Droxidopa was eventually stopped. 06/13/2023: No events overnight, no chest pain or hypertension. Remains on Levophed.  Decrease Midodrine to 5 mg TID and restart Droxidopa, increase Florinef and stop Solu-cortef in attempts to wean Levophed off. Obtain CT Chest to rule out Pulmonary pathology for etiology of PH. 06/14/2023: chest pain yesterday. Re-started on levophed over night. CT shows no acute finding of ILD. Droxidopa re-started yesterday. 06/15/2023: completed dialysis yesterday. Re-started again on levophed.  06/16/23- met with cardiology to discuss treatment options.  At this time patient remains severely hypotensive on  maximal medical management. Her Renal impairment and hypotension limit therapy.  Recommendation for palliative care at this time due to poor prognosis.  Interim History / Subjective:  -Pt is sleepy  -Levophed re-started overnight  Objective:  Blood pressure (!) 119/31, pulse 69, temperature 98.6 F (37 C), temperature source Axillary, resp. rate 14, height 5\' 4"  (1.626 m), weight 120.2 kg, SpO2 95%.        Intake/Output Summary (Last 24 hours) at 06/16/2023 1020 Last data filed at 06/16/2023 1009 Gross per 24 hour  Intake 1161.05 ml  Output --  Net 1161.05 ml   Filed Weights   06/14/23 1928 06/15/23 0500 06/16/23 0500  Weight: 116.5 kg 120.8 kg 120.2 kg   Physical Examination: General: Chronically ill-appearing elderly woman in NAD. Pleasant and conversant, eating breakfast. HEENT: [place]/AT, anicteric sclera, PERRL, dry mucous membranes. Neuro: Awake, oriented x 4. Responds to verbal stimuli. Following commands consistently. Moves all 4 extremities spontaneously. Generalized weakness. CV: Variable HR, irregularly irregular rhythm, no m/g/r.  PULM: Breathing even and unlabored on 2LNC. Lung fields CTAB in upper fields, faint bibasilar crackles. GI: Soft, nontender, nondistended. Normoactive bowel sounds. Extremities: No significant LE edema noted. Skin: Warm/dry, no rashes.  Resolved Hospital Problem List:     Assessment & Plan:   #Circulatory Shock: Suspect Cardiogenic given Bradycardia and hypotension with PH #Afib with slow ventricular rate, improved #Pulmonary Hypertension #Moderate Tricuspid Regurgitation History of CAD, HLD, permanent A. Fib, chronic hypotension with SBP in 80's on Midodrine 10 mg TID Echo 7/7 with LVEF 60-65% with diastolic heart failure, RV systolic function mildly reduced, severe pulmonary hypertension, moderate tricuspid regurgitation. TSH/T4 WNL.  LHC/RHC 7/10 with mild-moderate non-obstructive CAD, patent proximal RCA stent, mild-moderately elevated L  heart filling pressures, severely elevated R heart/pulm artery pressures, mildly reduced CO/CI. - Goal MAP > 65 - Avoiding fluid resuscitation in the setting of ESRD/HD - Levophed titrated to goal MAP; does best with low doses/not completely off-titrated; becomes bradycardic when NE completely off - Continue Florinef and increased midodrine - Cardiac monitoring - Optimized electrolytes for K > 4, Mg > 2 - Heparin gtt for now, transition back to Eliquis as able - Secondary prevention with Zetia, Crestor - Appreciate Cardiology/EP input, less likely HOHF due to CI 2.4 on cath - Consideration for leadless PM placement on hold - Deemed not a good candiate for valve repair or MitraClip given degree of PH and RV dysfunction - Unlikely to tolerate PH management - CT showing no evidence of ILD - Continue droxidopa and levophed as needed  #Initial Concern for Sepsis of unknown etiology ~ SEPSIS RULED OUT No clear sign of sepsis though patient is hypotensive. Blood cultures are drawn and are negative to date.  CXR without sign of pneumonia, unable to make urine - Trend WBC, fever curve; leukocytosis improved - Follow Cx data, 7/6 BCx NGTD - Off of antibiotics, low threshold for resumption if WBC uptrends/patient becomes febrile  #ESRD on HD  #Hyponatremia #AG Metabolic Acidosis Outpatient - on HD TThS via LUE AVF); dry weight 115kg. 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 Doubt patient would be able to tolerate PH treatment. No significant CAD to contribute to bradycardia. Less likely HOHF in the setting of CI 2.4.  - PMT consulted, appreciate assistance; will need ongoing GOC discussions if patient unable to tolerate more aggressive interventions -POOR PROGNOSIS , RECOMMEND HOSPICE   Best Practice (right click and "Reselect all SmartList Selections" daily)   Diet/type: Regular DVT prophylaxis: Heparin gtt GI prophylaxis: N/A Lines: Right radial A-line, and is still needed Foley:  N/A Code Status:  full code Last date of multidisciplinary goals of care discussion [7/12 - patient updated at bedside]  Labs   CBC: Recent Labs  Lab 06/12/23 0405 06/13/23 0604 06/14/23 0753 06/15/23 0438 06/16/23 0414  WBC 9.2  7.8 12.9* 12.5* 12.0*  HGB 10.6* 10.3* 10.1* 10.0* 9.7*  HCT 34.0* 32.2* 31.9* 32.2* 30.4*  MCV 98.6 97.9 98.5 99.1 99.0  PLT 122* 107* 140* 148* 153  Basic Metabolic Panel: Recent Labs  Lab 06/11/23 1722 06/12/23 0211 06/13/23 0604 06/14/23 0405 06/15/23 0438 06/16/23 0414  NA 131* 131* 132* 131* 132* 130*  K 3.8 3.3* 4.1 4.6 3.8 4.5  CL 95* 92* 94* 94* 96* 94*  CO2 24 27 27 26 27 27   GLUCOSE 118* 202* 243* 167* 206* 132*  BUN 46* 29* 33* 56* 38* 57*  CREATININE 6.22* 4.80* 4.60* 5.56* 4.10* 5.64*  CALCIUM 7.1* 7.6* 7.7* 7.8* 7.8* 7.8*  MG 1.5* 2.0 2.2  --  1.8 1.8  PHOS 3.7 2.6 1.9* 2.2* 2.2* 3.9  GFR: Estimated Creatinine Clearance: 10.3 mL/min (A) (by C-G formula based on SCr of 5.64 mg/dL (H)). Recent Labs  Lab 06/09/23 1238 06/09/23 1739 06/10/23 0445 06/13/23 0604 06/14/23 0753 06/15/23 0438 06/16/23 0414  WBC 11.2*  --    < > 7.8 12.9* 12.5* 12.0*  LATICACIDVEN 0.7 0.6  --   --   --    --   --    < > = values in this interval not displayed.  Liver Function Tests: Recent Labs  Lab 06/11/23 0454 06/12/23 0211 06/14/23 0405 06/15/23 0438 06/16/23 0414  ALBUMIN 2.9* 2.9* 2.9* 2.9* 3.1*   No results for input(s): "LIPASE", "AMYLASE" in the last 168 hours.  No results for input(s): "AMMONIA" in the last 168 hours.  ABG:    Component Value Date/Time   PHART 7.313 (L) 06/11/2023 1237   PCO2ART 48.7 (H) 06/11/2023 1237   PO2ART 90 06/11/2023 1237   HCO3 24.7 06/11/2023 1237   TCO2 26 06/11/2023 1237   ACIDBASEDEF 2.0 06/11/2023 1237   O2SAT 96 06/11/2023 1237   Coagulation Profile: No results for input(s): "INR", "PROTIME" in the last 168 hours.  Cardiac Enzymes: No results for input(s): "CKTOTAL", "CKMB", "CKMBINDEX", "TROPONINI" in the last 168 hours.  HbA1C: Hemoglobin A1C  Date/Time Value Ref Range Status  06/12/2021 12:00 AM 6.6  Final   Hgb A1c MFr Bld  Date/Time Value Ref Range Status  06/07/2023 07:44 PM 5.8 (H) 4.8 - 5.6 % Final    Comment:    (NOTE) Pre diabetes:          5.7%-6.4%  Diabetes:              >6.4%  Glycemic control for   <7.0% adults with diabetes   09/29/2022 04:55 PM 5.2 4.8 - 5.6 % Final    Comment:    (NOTE) Pre diabetes:          5.7%-6.4%  Diabetes:              >6.4%  Glycemic control for   <7.0% adults with diabetes    CBG: Recent Labs  Lab 06/15/23 1957 06/15/23 2134 06/15/23 2357 06/16/23 0413 06/16/23 0738  GLUCAP 154* 135* 131* 124* 126*   Past Medical History:  She,  has a past medical history of Arthritis, CAD (coronary artery disease), Chronic lower back pain, Diverticulosis, ESRD on dialysis (HCC), GERD (gastroesophageal reflux disease), Hiatal hernia, History of echocardiogram, Hyperlipidemia, Hypertension, Morbid obesity (HCC), Obesity, Persistent atrial fibrillation (HCC), Pneumonia, and Type II diabetes mellitus (HCC).   Surgical History:   Past Surgical History:  Procedure Laterality  Date   ABDOMINAL HYSTERECTOMY     AV FISTULA PLACEMENT Left 01/27/2014   Procedure: ARTERIOVENOUS (  AV) FISTULA CREATION;  Surgeon: Pryor Ochoa, MD;  Location: [facility] OR;  Service: Vascular;  Laterality: Left;   AV FISTULA REPAIR     "had it cleaned out"   CARDIAC CATHETERIZATION  ?1980s   CARDIAC CATHETERIZATION N/A 12/04/2016   Procedure: Left Heart Cath Angiography;  Surgeon: Kathleene Hazel, MD;  Location[facility] INVASIVE CV LAB;  Service: Cardiovascular;  Laterality: N/A;   CARDIAC CATHETERIZATION N/A 12/04/2016   Procedure: Coronary Stent Intervention;  Surgeon: Kathleene Hazel, MD;  Location: [facility] INVASIVE CV LAB;  Service: Cardiovascular;  Laterality: N/A;  Mid RCA   CATARACT EXTRACTION W/ INTRAOCULAR LENS IMPLANT Left    COLONOSCOPY W/ BIOPSIES AND POLYPECTOMY     CORONARY ANGIOPLASTY WITH STENT PLACEMENT  12/04/2016   ESOPHAGOGASTRODUODENOSCOPY     INSERTION OF DIALYSIS CATHETER N/A 01/19/2014   Procedure: INSERTION OF DIALYSIS CATHETER;  Surgeon: Larina Earthly, MD;  Location[facility] OR;  Service: Vascular;  Laterality: N/A;   RIGHT/LEFT HEART CATH AND CORONARY ANGIOGRAPHY N/A 06/11/2023   Procedure: RIGHT/LEFT HEART CATH AND CORONARY ANGIOGRAPHY;  Surgeon: Yvonne Kendall, MD;  Location: [facility] INVASIVE CV LAB;  Service: Cardiovascular;  Laterality: N/A;    Social History:   reports that she quit smoking about 39 years ago. Her smoking use included cigarettes. She started smoking about 59 years ago. She has a 20 pack-year smoking history. She has never used smokeless tobacco. She reports that she does not drink alcohol and does not use drugs.   Family History:  Her family history includes Breast cancer in her sister; CAD in her father and sister; Cancer in her brother; Diabetes in her mother; Heart attack in her father; Heart disease in her father; Hypertension in her father; Peripheral vascular disease in her mother.   Allergies Allergies  Allergen Reactions   Sulfa Antibiotics  Other (See Comments)   Sulfa Drugs Cross Reactors Other (See Comments)    Doesn't remember     Home Medications  Prior to Admission medications   Medication Sig Start Date End Date Taking? Authorizing Provider  acetaminophen (TYLENOL) 650 MG CR tablet Take 1,300 mg by mouth every 8 (eight) hours as needed for pain.    [provider]  albuterol (VENTOLIN HFA) 108 (90 Base) MCG/ACT inhaler Inhale 1-2 puffs into the lungs every 6 (six) hours as needed for wheezing or shortness of breath. 12/15/21   Franne Forts, DO  apixaban (ELIQUIS) 5 MG TABS tablet Take 1 tablet (5 mg total) by mouth 2 (two) times daily. 10/02/22   Tresa Moore, MD  B Complex-C-Zn-Folic Acid (DIALYVITE/ZINC) TABS Take 1 tablet by mouth daily.    [provider]  bisacodyl (DULCOLAX) 5 MG EC tablet Take 2 tablets (10 mg total) by mouth daily as needed for moderate constipation. 10/02/22   Tresa Moore, MD  cinacalcet (SENSIPAR) 30 MG tablet Take 30 mg by mouth daily with supper.    [provider]  ezetimibe (ZETIA) 10 MG tablet Take 1 tablet (10 mg total) by mouth daily. 04/30/22   Iran Ouch, MD  ferric citrate (AURYXIA) 1 GM 210 MG(Fe) tablet Take 420 mg by mouth 3 (three) times daily with meals.    [provider]  fexofenadine (ALLEGRA) 180 MG tablet Take 180 mg by mouth daily.    [provider]  midodrine (PROAMATINE) 10 MG tablet Take 20 mg by mouth daily. (Take additional two tablets [20mg ] for administration at dialysis on Tuesday, Thursday and Saturday)  [provider]  midodrine (PROAMATINE) 10 MG tablet Take 20 mg by mouth daily as needed (for SBP <100).    [provider]  Omega-3 Fatty Acids (FISH OIL) 1000 MG CPDR Take 1,000 mg by mouth daily.    [provider]  omeprazole (PRILOSEC) 20 MG capsule Take 1 capsule (20 mg total) by mouth daily. Patient taking differently: Take 20 mg by mouth 2 (two) times daily. 02/21/22    Arnette Felts, FNP  ondansetron (ZOFRAN-ODT) 4 MG disintegrating tablet Take 4 mg by mouth every 8 (eight) hours as needed for nausea or vomiting.    [provider]  polyethylene glycol (MIRALAX / GLYCOLAX) 17 g packet Take 17 g by mouth at bedtime.    [provider]  pregabalin (LYRICA) 100 MG capsule Take 100 mg by mouth at bedtime.    [provider]  pregabalin (LYRICA) 50 MG capsule Take 50 mg by mouth 2 (two) times daily.    [provider]  rosuvastatin (CRESTOR) 10 MG tablet Take 1 tablet (10 mg total) by mouth daily. 10/03/22   Tresa Moore, MD  sennosides-docusate sodium (SENOKOT-S) 8.6-50 MG tablet Take 1 tablet by mouth 2 (two) times daily.    [provider]  trolamine salicylate (ASPERCREME) 10 % cream Apply 1 Application topically 2 (two) times daily. (Apply to knees)    [provider]    Critical care time:   Critical care provider statement:   Total critical care time: 33 minutes   Performed by: Karna Christmas MD   Critical care time was exclusive of separately billable procedures and treating other patients.   Critical care was necessary to treat or prevent imminent or life-threatening deterioration.   Critical care was time spent personally by me on the following activities: development of treatment plan with patient and/or surrogate as well as nursing, discussions with consultants, evaluation of patient's response to treatment, examination of patient, obtaining history from patient or surrogate, ordering and performing treatments and interventions, ordering and review of laboratory studies, ordering and review of radiographic studies, pulse oximetry and re-evaluation of patient's condition.    Vida Rigger, M.D.  Pulmonary & Critical Care Medicine

## 2023-06-16 NOTE — Progress Notes (Signed)
[facility]  ROUNDING NOTE   Subjective:   Patient sitting up in bed Alert and oriented  Remains on 3L [place]  Remains on norepinephrine gtt.   Objective:  Vital signs in last 24 hours:  Temp:  [98.1 F (36.7 C)-98.6 F (37 C)] 98.6 F (37 C) (07/15 1200) Pulse Rate:  [48-80] 48 (07/15 1400) Resp:  [12-18] 15 (07/15 1400) SpO2:  [95 %-99 %] 96 % (07/15 1400) Arterial Line BP: (99-145)/(23-37) 108/23 (07/15 1400) Weight:  [120.2 kg] 120.2 kg (07/15 0500)  Weight change: 2.1 kg Filed Weights   06/14/23 1928 06/15/23 0500 06/16/23 0500  Weight: 116.5 kg 120.8 kg 120.2 kg    Intake/Output: I/O last 3 completed shifts: In: 971.2 [P.O.:100; I.V.:871.2] Out: 1500 [Other:1500]   Intake/Output this shift:  Total I/O In: 572.2 [P.O.:240; I.V.:332.2] Out: -   Physical Exam: General: NAD, laying in bed  Head: Normocephalic, atraumatic. Moist oral mucosal membranes  Eyes: Anicteric  Lungs:  Clear to auscultation, normal effort  Heart: Regular rate and rhythm  Abdomen:  Soft, nontender,   Extremities: no peripheral edema.  Neurologic: Alert and oriented, moving all four extremities  Skin: No lesions  Access: Lt AVF    Basic Metabolic Panel: Recent Labs  Lab 06/11/23 1722 06/12/23 0211 06/13/23 0604 06/14/23 0405 06/15/23 0438 06/16/23 0414  NA 131* 131* 132* 131* 132* 130*  K 3.8 3.3* 4.1 4.6 3.8 4.5  CL 95* 92* 94* 94* 96* 94*  CO2 24 27 27 26 27 27   GLUCOSE 118* 202* 243* 167* 206* 132*  BUN 46* 29* 33* 56* 38* 57*  CREATININE 6.22* 4.80* 4.60* 5.56* 4.10* 5.64*  CALCIUM 7.1* 7.6* 7.7* 7.8* 7.8* 7.8*  MG 1.5* 2.0 2.2  --  1.8 1.8  PHOS 3.7 2.6 1.9* 2.2* 2.2* 3.9    Liver Function Tests: Recent Labs  Lab 06/11/23 0454 06/12/23 0211 06/14/23 0405 06/15/23 0438 06/16/23 0414  ALBUMIN 2.9* 2.9* 2.9* 2.9* 3.1*   No results for input(s): "LIPASE", "AMYLASE" in the last 168 hours.  No results for input(s): "AMMONIA" in the last 168  hours.  CBC: Recent Labs  Lab 06/12/23 0405 06/13/23 0604 06/14/23 0753 06/15/23 0438 06/16/23 0414  WBC 9.2 7.8 12.9* 12.5* 12.0*  HGB 10.6* 10.3* 10.1* 10.0* 9.7*  HCT 34.0* 32.2* 31.9* 32.2* 30.4*  MCV 98.6 97.9 98.5 99.1 99.0  PLT 122* 107* 140* 148* 153    Cardiac Enzymes: No results for input(s): "CKTOTAL", "CKMB", "CKMBINDEX", "TROPONINI" in the last 168 hours.  BNP: Invalid input(s): "POCBNP"  CBG: Recent Labs  Lab 06/15/23 2134 06/15/23 2357 06/16/23 0413 06/16/23 0738 06/16/23 1122  GLUCAP 135* 131* 124* 126* 129*    Microbiology: Results for orders placed or performed during the hospital encounter of 06/07/23  Blood culture (routine x 2)     Status: None   Collection Time: 06/07/23 12:54 PM   Specimen: BLOOD  Result Value Ref Range Status   Specimen Description BLOOD RIGHT ANTECUBITAL  Final   Special Requests   Final    BOTTLES DRAWN AEROBIC AND ANAEROBIC Blood Culture adequate volume   Culture   Final    NO GROWTH 5 DAYS Performed at [facility], [address], [place], [place] [address]    Report Status 06/12/2023 FINAL  Final  Blood culture (routine x 2)     Status: None   Collection Time: 06/07/23 12:54 PM   Specimen: BLOOD  Result Value Ref Range Status   Specimen Description  BLOOD BLOOD RIGHT FOREARM  Final   Special Requests   Final    BOTTLES DRAWN AEROBIC AND ANAEROBIC Blood Culture adequate volume   Culture   Final    NO GROWTH 5 DAYS Performed at [facility], [address], [place], [place] [address]    Report Status 06/12/2023 FINAL  Final  MRSA Next Gen by PCR, Nasal     Status: None   Collection Time: 06/07/23 10:18 PM   Specimen: Nasal Mucosa; Nasal Swab  Result Value Ref Range Status   MRSA by PCR Next Gen NOT DETECTED NOT DETECTED Final    Comment: (NOTE) The GeneXpert MRSA Assay (FDA approved for NASAL specimens only), is one component of a comprehensive MRSA colonization surveillance program. It  is not intended to diagnose MRSA infection nor to guide or monitor treatment for MRSA infections. Test performance is not FDA approved in patients less than 65 years old. Performed at [facility], [address] [place], [place] [address]     Coagulation Studies: No results for input(s): "LABPROT", "INR" in the last 72 hours.  Urinalysis: No results for input(s): "COLORURINE", "LABSPEC", "PHURINE", "GLUCOSEU", "HGBUR", "BILIRUBINUR", "KETONESUR", "PROTEINUR", "UROBILINOGEN", "NITRITE", "LEUKOCYTESUR" in the last 72 hours.  Invalid input(s): "APPERANCEUR"    Imaging: No results found.   Medications:    sodium chloride Stopped (06/10/23 1900)   sodium chloride     anticoagulant sodium citrate     heparin 1,250 Units/hr (06/16/23 1403)   norepinephrine (LEVOPHED) Adult infusion 2 mcg/min (06/16/23 1403)    Chlorhexidine Gluconate Cloth  6 each Topical QHS   droxidopa  100 mg Oral TID WC   ezetimibe  10 mg Oral Daily   feeding supplement (NEPRO CARB STEADY)  237 mL Oral TID BM   fludrocortisone  0.2 mg Oral BID   insulin aspart  0-20 Units Subcutaneous TID WC   insulin aspart  0-5 Units Subcutaneous QHS   insulin glargine-yfgn  5 Units Subcutaneous Daily   loratadine  10 mg Oral Daily   midodrine  10 mg Oral TID WC   multivitamin  1 tablet Oral QHS   pregabalin  100 mg Oral QHS   pregabalin  50 mg Oral BID   rosuvastatin  10 mg Oral Daily   senna-docusate  1 tablet Oral BID   sodium chloride flush  3 mL Intravenous Q12H   sodium chloride, acetaminophen, alteplase, alum & mag hydroxide-simeth, anticoagulant sodium citrate, bisacodyl, docusate sodium, guaiFENesin-dextromethorphan, heparin, HYDROcodone-acetaminophen, lidocaine (PF), lidocaine-prilocaine, morphine injection, nitroGLYCERIN, ondansetron (ZOFRAN) IV, mouth rinse, pentafluoroprop-tetrafluoroeth, polyethylene glycol, sodium chloride flush  Assessment/ Plan:  [name] is a 77 y.o.  female   with a PMHx of ESRD on HD TTS at [facility], diabetes mellitus type 2, chronic hypotension, peripheral arterial disease, obstructive sleep apnea, GERD, obesity, hyperlipidemia, anemia chronic kidney disease, secondary hyperparathyroidism, who was admitted to [facility] on 06/07/2023 for evaluation of generalized weakness.   1  ESRD on HD TTS: remains on norepinephrine.  - Continue  TTS schedule.  - Next treatment scheduled for Tuesday  2.  Hypotension with severe pulmonary hypertension..  Chronic in nature.  Systolic blood pressure usually in the 80s as an outpatient.   - midodrine 10 mg 3 times daily as outpatient.    - vasopressors: norepinephrine  3. Anemia of chronic kidney disease Lab Results  Component Value Date   HGB 9.7 (L) 06/16/2023    Continue Mircera as an outpatient.  4. Diabetes mellitus type  II with chronic kidney disease/renal manifestations: noninsulin dependent.  Most recent hemoglobin A1c is 5.8 on 06/07/23.  - continue glucose control.   5. Secondary Hyperparathyroidism:   with hypocalcemia and hypophosphatemia.   Lab Results  Component Value Date   PTH 388.6 (H) 01/17/2014   CALCIUM 7.8 (L) 06/16/2023   CAION 0.92 (L) 06/11/2023   PHOS 3.9 06/16/2023    Holding Auryxia and Sensipar     LOS: 9 Ainara Eldridge 7/15/20242:47 PM

## 2023-06-17 DIAGNOSIS — Z7189 Other specified counseling: Secondary | ICD-10-CM | POA: Diagnosis not present

## 2023-06-17 DIAGNOSIS — I4821 Permanent atrial fibrillation: Secondary | ICD-10-CM | POA: Diagnosis not present

## 2023-06-17 DIAGNOSIS — R001 Bradycardia, unspecified: Secondary | ICD-10-CM | POA: Diagnosis not present

## 2023-06-17 DIAGNOSIS — I959 Hypotension, unspecified: Secondary | ICD-10-CM | POA: Diagnosis not present

## 2023-06-17 LAB — CBC
HCT: 29.1 % — ABNORMAL LOW (ref 36.0–46.0)
Hemoglobin: 9.2 g/dL — ABNORMAL LOW (ref 12.0–15.0)
MCH: 30.7 pg (ref 26.0–34.0)
MCHC: 31.6 g/dL (ref 30.0–36.0)
MCV: 97 fL (ref 80.0–100.0)
Platelets: 145 10*3/uL — ABNORMAL LOW (ref 150–400)
RBC: 3 MIL/uL — ABNORMAL LOW (ref 3.87–5.11)
RDW: 15.1 % (ref 11.5–15.5)
WBC: 10.8 10*3/uL — ABNORMAL HIGH (ref 4.0–10.5)
nRBC: 0 % (ref 0.0–0.2)

## 2023-06-17 LAB — RENAL FUNCTION PANEL
Albumin: 2.9 g/dL — ABNORMAL LOW (ref 3.5–5.0)
Anion gap: 12 (ref 5–15)
BUN: 78 mg/dL — ABNORMAL HIGH (ref 8–23)
CO2: 24 mmol/L (ref 22–32)
Calcium: 7.8 mg/dL — ABNORMAL LOW (ref 8.9–10.3)
Chloride: 93 mmol/L — ABNORMAL LOW (ref 98–111)
Creatinine, Ser: 7.25 mg/dL — ABNORMAL HIGH (ref 0.44–1.00)
GFR, Estimated: 5 mL/min — ABNORMAL LOW (ref >60)
Glucose, Bld: 136 mg/dL — ABNORMAL HIGH (ref 70–99)
Phosphorus: 4.6 mg/dL (ref 2.5–4.6)
Potassium: 4.6 mmol/L (ref 3.5–5.1)
Sodium: 129 mmol/L — ABNORMAL LOW (ref 135–145)

## 2023-06-17 LAB — GLUCOSE, CAPILLARY
Glucose-Capillary: 105 mg/dL — ABNORMAL HIGH (ref 70–99)
Glucose-Capillary: 160 mg/dL — ABNORMAL HIGH (ref 70–99)
Glucose-Capillary: 129 mg/dL — ABNORMAL HIGH (ref 70–99)
Glucose-Capillary: 158 mg/dL — ABNORMAL HIGH (ref 70–99)

## 2023-06-17 LAB — HEPARIN LEVEL (UNFRACTIONATED): Heparin Unfractionated: 0.35 IU/mL (ref 0.30–0.70)

## 2023-06-17 MED ORDER — ALBUMIN HUMAN 25 % IV SOLN
INTRAVENOUS | Status: AC
  Filled 2023-06-17: qty 100

## 2023-06-17 MED ORDER — PENTAFLUOROPROP-TETRAFLUOROETH EX AERO
INHALATION_SPRAY | CUTANEOUS | Status: AC
  Filled 2023-06-17: qty 30

## 2023-06-17 MED ORDER — ALBUMIN HUMAN 25 % IV SOLN
25.0000 g | Freq: Once | INTRAVENOUS | Status: AC
  Administered 2023-06-17: 25 g via INTRAVENOUS

## 2023-06-17 MED ORDER — MIDODRINE HCL 5 MG PO TABS
15.0000 mg | ORAL_TABLET | Freq: Three times a day (TID) | ORAL | Status: DC
  Administered 2023-06-17 (×2): 15 mg via ORAL
  Filled 2023-06-17 (×2): qty 3

## 2023-06-17 MED ORDER — MIDODRINE HCL 5 MG PO TABS
10.0000 mg | ORAL_TABLET | Freq: Three times a day (TID) | ORAL | Status: DC
  Administered 2023-06-17 – 2023-07-01 (×43): 10 mg via ORAL
  Filled 2023-06-17 (×39): qty 2

## 2023-06-17 NOTE — Progress Notes (Signed)
Report given to [facility] HD RN. Patient transported to HD by transporter on 5L [place]. Vitals patient's baseline limits at time of transfer. Patient ate lunch prior to being transported to HD. Pain and bathroom needs assessed prior to patient leaving ICU unit.

## 2023-06-17 NOTE — Consult Note (Signed)
PHARMACY CONSULT NOTE  Pharmacy Consult for Electrolyte Monitoring and Replacement   Recent Labs: Potassium (mmol/L)  Date Value  06/17/2023 4.6   Magnesium (mg/dL)  Date Value  59/56/3875 1.8   Calcium (mg/dL)  Date Value  64/33/2951 7.8 (L)   Albumin (g/dL)  Date Value  88/41/6606 2.9 (L)  10/10/2021 4.1   Phosphorus (mg/dL)  Date Value  30/16/0109 4.6   Sodium (mmol/L)  Date Value  06/17/2023 129 (L)  10/10/2021 142    Assessment: 77 y.o female with significant PMH of HTN, T2DM,ESRD on HD TTS, Chronic debility , CAD s/p stent, PAF,  OSA, PAD, Left knee arthritis, Chronic lower back pain,GERD,Obesity , HLD  who presented to the ED  from Dialysis with chief complaints of generalized weakness x 4 days.   Goal of Therapy:  electrolytes within normal limits  Plan:  --no electrolyte replacement warranted for today --f/u with AM labs.    Bettey Costa, PharmD Clinical Pharmacist 06/17/2023 7:34 AM

## 2023-06-17 NOTE — Progress Notes (Signed)
NAME:  Jacqueline [name], MRN:  [id number], DOB:  [birth year], LOS: 10 ADMISSION DATE:  06/07/2023, CONSULTATION DATE:  06/07/2023 REFERRING MD:  Dr. Allena Katz, CHIEF COMPLAINT:  Hypotension   Brief Pt Description / Synopsis:  77 y.o woman with PMHx significant for chronic hypotension (SBP in 80's on Midodrine 10 mg TID) and ESRD on HD (TTS) admitted with Cardiogenic shock due to Bradycardia.   History of Present Illness:  77 y.o female with significant PMHx significant for HTN, T2DM, ESRD on HD TTS, Chronic debility, CAD s/p stent, PAF, OSA, PAD, Left knee arthritis, Chronic lower back pain, GERD, Obesity, HLD who presented to the ED from Dialysis with generalized weakness x 4 days.   EMS report: NVD and LLQ abdominal pain since last night. States tried to eat yesterday, each time she ate she vomited, then had diarrhea last night. No NVD today. She went to Dialysis today and was noted with low BP of 72/42. She usually runs lows BP and takes Midodrine but today her BP was lower than usually so she was not dialyzed but was sent to ED for further evaluation.   ED Course:  Initial vital signs showed HR of 50 beats/minute, BP 81/51 mm Hg, the RR 16 breaths/minute, and the oxygen saturation 91% on 2L and a temperature of 97.29F (36.6C).   Pertinent Labs/Diagnostics.   Na+/ K+: 133/3.4 Glucose:130 BUN/Cr.:55/7.76 WBC:11.5 Plts: 146 PCT: pending Lactic acid: 1.3 EKG:?Afib vs junctional bradycardia rate 40bmp. Incomplete RBB,   CXR, CT scan obtained and show no signs of infection or other abnormality. Patient given, midodrine, atropine for significant bradycardia and  500 cc of fluids and started on broad-spectrum antibiotics Vanco cefepime and Flagyl for  suspected sepsis of unknown source. Patient remained hypotensive despite  intervention as above. PCCM consulted for possible pressors.  Pertinent Medical History:   Past Medical History:  Diagnosis Date   Arthritis    "left knee" (12/04/2016)   CAD  (coronary artery disease)    a. 12/2016 NSTEMI/PCI: LM nl, LaD 20p/d, D1 20, OM2 20, OM3 20, RCA 67m (3.0x18 Resolute DES). EF 65%.   Chronic lower back pain    Diverticulosis    on CT   ESRD on dialysis [facility])    GERD (gastroesophageal reflux disease)    Hiatal hernia    History of echocardiogram    a. 09/2022 Echo: EF 60-65%, mild asymm LVH, mod reduced RV fxn, RVSP . Mildly dil RA. Mild AI. Ao sclerosis. Mild MR.   Hyperlipidemia    Hypertension    a. 12/2016 labetalol d/c'd 2/2 hypotension in HD.   Morbid obesity (HCC)    Obesity    Persistent atrial fibrillation (HCC)    a. 05/2023 Zio: 100% afib avg HR 52 (32-99).   Pneumonia    "couple times" (12/04/2016)   Type II diabetes mellitus (HCC)    Micro Data:  7/6: Blood culture x2>> negative 7/6: MRSA PCR >> negative  Antimicrobials:   Anti-infectives (From admission, onward)    Start     Dose/Rate Route Frequency Ordered Stop   06/10/23 1200  vancomycin (VANCOCIN) IVPB 1000 mg/200 mL premix  Status:  Discontinued       Placed in "Followed by" Linked Group   1,000 mg 200 mL/hr over 60 Minutes Intravenous Every T-Th-Sa (Hemodialysis) 06/08/23 1058 06/09/23 1029   06/08/23 1145  vancomycin (VANCOCIN) IVPB 1000 mg/200 mL premix       Placed in "Followed by" Linked Group   1,000 mg 200  mL/hr over 60 Minutes Intravenous  Once 06/08/23 1058 06/08/23 1655   06/08/23 1100  piperacillin-tazobactam (ZOSYN) IVPB 2.25 g  Status:  Discontinued        2.25 g 100 mL/hr over 30 Minutes Intravenous Every 8 hours 06/08/23 1058 06/09/23 1029   06/07/23 1400  metroNIDAZOLE (FLAGYL) IVPB 500 mg        500 mg 100 mL/hr over 60 Minutes Intravenous  Once 06/07/23 1346 06/07/23 1652   06/07/23 1400  ceFEPIme (MAXIPIME) 2 g in sodium chloride 0.9 % 100 mL IVPB        2 g 200 mL/hr over 30 Minutes Intravenous  Once 06/07/23 1349 06/07/23 1512   06/07/23 1400  vancomycin (VANCOCIN) IVPB 1000 mg/200 mL premix        1,000 mg 200 mL/hr over 60  Minutes Intravenous  Once 06/07/23 1349 06/07/23 1652      Significant Hospital Events: Including procedures, antibiotic start and stop dates in addition to other pertinent events   06/07/2023: admit to ICU with bradycardia, started on dopamine 06/08/2023: weaned off dopamine, hypotensive again, dopamine restarted 06/09/2023:  Remains hypotensive requiring Levophed, (pt is vasculopath, doubt accuracy of cuff BP as pt is awake, alert, and asymptomatic).  Attempted Arterial line but unsuccessful, Vascular Surgery consulted to assist with placement. Nephrology consulted, received HD. 06/10/2023: Continues to require Levophed (6 mcg currently), when Levo weaned down does exhibit some Bradycardia in the upper 50's.  Cardiology considering R Heart Cath today. Holding on transfer to [facility] for pacer at this time. 06/11/2023: Levophed weaning down, this AM. LHC/RHC with mild-moderate non-obstructive CAD, patent proximal RCA stent, mild-moderately elevated L heart filling pressures, severely elevated R heart/pulm artery pressures, mildly reduced CO/CI. Episode of chest pressure with HD, resolved with morphine. 06/12/2023: Additional episodes of chest tightness/pressure overnight (0150 and 0554). EKG without changes, trops flat. Hypertensive to 227/80, then with NE off HR dropped to 29.  Droxidopa was eventually stopped. 06/13/2023: No events overnight, no chest pain or hypertension. Remains on Levophed.  Decrease Midodrine to 5 mg TID and restart Droxidopa, increase Florinef and stop Solu-cortef in attempts to wean Levophed off. Obtain CT Chest to rule out Pulmonary pathology for etiology of PH. 06/14/2023: chest pain yesterday. Re-started on levophed over night. CT shows no acute finding of ILD. Droxidopa re-started yesterday. 06/15/2023: completed dialysis yesterday. Re-started again on levophed.  06/16/23- met with cardiology to discuss treatment options.  At this time patient remains severely hypotensive on  maximal medical management. Her Renal impairment and hypotension limit therapy.  Recommendation for palliative care at this time due to poor prognosis. 06/17/23- patient remains hypotensive with poor prognosis. She is requiring less vasopressor support.   Interim History / Subjective:  -Pt is sleepy  -Levophed re-started overnight  Objective:  Blood pressure (!) 119/31, pulse (!) 58, temperature 98.3 F (36.8 C), temperature source Oral, resp. rate 15, height 5\' 4"  (1.626 m), weight 120.7 kg, SpO2 95%.        Intake/Output Summary (Last 24 hours) at 06/17/2023 0826 Last data filed at 06/17/2023 0659 Gross per 24 hour  Intake 844.12 ml  Output --  Net 844.12 ml   Filed Weights   06/15/23 0500 06/16/23 0500 06/17/23 0500  Weight: 120.8 kg 120.2 kg 120.7 kg   Physical Examination: General: Chronically ill-appearing elderly woman in NAD. Pleasant and conversant, eating breakfast. HEENT: [place]/AT, anicteric sclera, PERRL, dry mucous membranes. Neuro: Awake, oriented x 4. Responds to verbal stimuli. Following commands consistently.  Moves all 4 extremities spontaneously. Generalized weakness. CV: Variable HR, irregularly irregular rhythm, no m/g/r. PULM: Breathing even and unlabored on 2LNC. Lung fields CTAB in upper fields, faint bibasilar crackles. GI: Soft, nontender, nondistended. Normoactive bowel sounds. Extremities: No significant LE edema noted. Skin: Warm/dry, no rashes.  Resolved Hospital Problem List:     Assessment & Plan:   #Circulatory Shock: Suspect Cardiogenic given Bradycardia and hypotension with PH #Afib with slow ventricular rate, improved #Pulmonary Hypertension #Moderate Tricuspid Regurgitation History of CAD, HLD, permanent A. Fib, chronic hypotension with SBP in 80's on Midodrine 10 mg TID Echo 7/7 with LVEF 60-65% with diastolic heart failure, RV systolic function mildly reduced, severe pulmonary hypertension, moderate tricuspid regurgitation. TSH/T4 WNL.   LHC/RHC 7/10 with mild-moderate non-obstructive CAD, patent proximal RCA stent, mild-moderately elevated L heart filling pressures, severely elevated R heart/pulm artery pressures, mildly reduced CO/CI. - Goal MAP > 65 - Avoiding fluid resuscitation in the setting of ESRD/HD - Levophed titrated to goal MAP; does best with low doses/not completely off-titrated; becomes bradycardic when NE completely off - Continue Florinef and increased midodrine - Cardiac monitoring - Optimized electrolytes for K > 4, Mg > 2 - Heparin gtt for now, transition back to Eliquis as able - Secondary prevention with Zetia, Crestor - Appreciate Cardiology/EP input, less likely HOHF due to CI 2.4 on cath - Consideration for leadless PM placement on hold - Deemed not a good candiate for valve repair or MitraClip given degree of PH and RV dysfunction - Unlikely to tolerate PH management - CT showing no evidence of ILD - Continue droxidopa and levophed as needed  #Initial Concern for Sepsis of unknown etiology ~ SEPSIS RULED OUT No clear sign of sepsis though patient is hypotensive. Blood cultures are drawn and are negative to date.  CXR without sign of pneumonia, unable to make urine - Trend WBC, fever curve; leukocytosis improved - Follow Cx data, 7/6 BCx NGTD - Off of antibiotics, low threshold for resumption if WBC uptrends/patient becomes febrile  #ESRD on HD  #Hyponatremia #AG Metabolic Acidosis Outpatient - on HD TThS via LUE AVF); dry weight 115kg. -Monitor I&O's / urinary output -Follow BMP -Ensure adequate renal perfusion -Avoid nephrotoxic agents as able -Replace electrolytes as indicated -Nephrology following, appreciate input -HD per Nephro  #Mild Thrombocytopenia #Anemia of chronic disease in the setting of ESRD - Trend H&H - Monitor for signs of active bleeding while on heparin gtt - Transfuse for Hgb < 7.0 or hemodynamically significant bleeding  #T2DM  - SSI - CBGs AC/HS, now that  patient is tolerating diet - Goal CBG 140-180  GOC - Remains FULL CODE status at present - Difficult situation as patient continues to have difficulty tolerating HD with UF, especially in the setting of labile BP/variable pressor requirements; LHC/RHC 7/10 with only mild-moderate CAD, patent RCA stent, elevated R heart/PA pressures. Doubt patient would be able to tolerate PH treatment. No significant CAD to contribute to bradycardia. Less likely HOHF in the setting of CI 2.4.  - PMT consulted, appreciate assistance; will need ongoing GOC discussions if patient unable to tolerate more aggressive interventions -POOR PROGNOSIS , RECOMMEND HOSPICE   Best Practice (right click and "Reselect all SmartList Selections" daily)   Diet/type: Regular DVT prophylaxis: Heparin gtt GI prophylaxis: N/A Lines: Right radial A-line, and is still needed Foley:  N/A Code Status:  full code Last date of multidisciplinary goals of care discussion [7/12 - patient updated at bedside]  Labs   CBC: Recent Labs  Lab 06/13/23 0604 06/14/23 0753 06/15/23 0438 06/16/23 0414 06/17/23 0508  WBC 7.8 12.9* 12.5* 12.0* 10.8*  HGB 10.3* 10.1* 10.0* 9.7* 9.2*  HCT 32.2* 31.9* 32.2* 30.4* 29.1*  MCV 97.9 98.5 99.1 99.0 97.0  PLT 107* 140* 148* 153 145*  Basic Metabolic Panel: Recent Labs  Lab 06/11/23 1722 06/12/23 0211 06/13/23 0604 06/14/23 0405 06/15/23 0438 06/16/23 0414 06/17/23 0508  NA 131* 131* 132* 131* 132* 130* 129*  K 3.8 3.3* 4.1 4.6 3.8 4.5 4.6  CL 95* 92* 94* 94* 96* 94* 93*  CO2 24 27 27 26 27 27 24   GLUCOSE 118* 202* 243* 167* 206* 132* 136*  BUN 46* 29* 33* 56* 38* 57* 78*  CREATININE 6.22* 4.80* 4.60* 5.56* 4.10* 5.64* 7.25*  CALCIUM 7.1* 7.6* 7.7* 7.8* 7.8* 7.8* 7.8*  MG 1.5* 2.0 2.2  --  1.8 1.8  --   PHOS 3.7 2.6 1.9* 2.2* 2.2* 3.9 4.6  GFR: Estimated Creatinine Clearance: 8.1 mL/min (A) (by C-G formula based on SCr of 7.25 mg/dL (H)). Recent Labs  Lab 06/14/23 0753  06/15/23 0438 06/16/23 0414 06/17/23 0508  WBC 12.9* 12.5* 12.0* 10.8*  Liver Function Tests: Recent Labs  Lab 06/12/23 0211 06/14/23 0405 06/15/23 0438 06/16/23 0414 06/17/23 0508  ALBUMIN 2.9* 2.9* 2.9* 3.1* 2.9*   No results for input(s): "LIPASE", "AMYLASE" in the last 168 hours.  No results for input(s): "AMMONIA" in the last 168 hours.  ABG:    Component Value Date/Time   PHART 7.313 (L) 06/11/2023 1237   PCO2ART 48.7 (H) 06/11/2023 1237   PO2ART 90 06/11/2023 1237   HCO3 24.7 06/11/2023 1237   TCO2 26 06/11/2023 1237   ACIDBASEDEF 2.0 06/11/2023 1237   O2SAT 96 06/11/2023 1237   Coagulation Profile: No results for input(s): "INR", "PROTIME" in the last 168 hours.  Cardiac Enzymes: No results for input(s): "CKTOTAL", "CKMB", "CKMBINDEX", "TROPONINI" in the last 168 hours.  HbA1C: Hemoglobin A1C  Date/Time Value Ref Range Status  06/12/2021 12:00 AM 6.6  Final   Hgb A1c MFr Bld  Date/Time Value Ref Range Status  06/07/2023 07:44 PM 5.8 (H) 4.8 - 5.6 % Final    Comment:    (NOTE) Pre diabetes:          5.7%-6.4%  Diabetes:              >6.4%  Glycemic control for   <7.0% adults with diabetes   09/29/2022 04:55 PM 5.2 4.8 - 5.6 % Final    Comment:    (NOTE) Pre diabetes:          5.7%-6.4%  Diabetes:              >6.4%  Glycemic control for   <7.0% adults with diabetes    CBG: Recent Labs  Lab 06/16/23 1122 06/16/23 1628 06/16/23 2145 06/16/23 2325 06/17/23 0754  GLUCAP 129* 146* 175* 146* 105*   Past Medical History:  She,  has a past medical history of Arthritis, CAD (coronary artery disease), Chronic lower back pain, Diverticulosis, ESRD on dialysis (HCC), GERD (gastroesophageal reflux disease), Hiatal hernia, History of echocardiogram, Hyperlipidemia, Hypertension, Morbid obesity (HCC), Obesity, Persistent atrial fibrillation (HCC), Pneumonia, and Type II diabetes mellitus (HCC).   Surgical History:   Past Surgical History:   Procedure Laterality Date   ABDOMINAL HYSTERECTOMY     AV FISTULA PLACEMENT Left 01/27/2014   Procedure: ARTERIOVENOUS (AV) FISTULA CREATION;  Surgeon: Pryor Ochoa, MD;  Location: [facility] OR;  Service: Vascular;  Laterality: Left;   AV FISTULA REPAIR     "had it cleaned out"   CARDIAC CATHETERIZATION  ?1980s   CARDIAC CATHETERIZATION N/A 12/04/2016   Procedure: Left Heart Cath Angiography;  Surgeon: Kathleene Hazel, MD;  Location: [facility] INVASIVE CV LAB;  Service: Cardiovascular;  Laterality: N/A;   CARDIAC CATHETERIZATION N/A 12/04/2016   Procedure: Coronary Stent Intervention;  Surgeon: Kathleene Hazel, MD;  Location: [facility] INVASIVE CV LAB;  Service: Cardiovascular;  Laterality: N/A;  Mid RCA   CATARACT EXTRACTION W/ INTRAOCULAR LENS IMPLANT Left    COLONOSCOPY W/ BIOPSIES AND POLYPECTOMY     CORONARY ANGIOPLASTY WITH STENT PLACEMENT  12/04/2016   ESOPHAGOGASTRODUODENOSCOPY     INSERTION OF DIALYSIS CATHETER N/A 01/19/2014   Procedure: INSERTION OF DIALYSIS CATHETER;  Surgeon: Larina Earthly, MD;  Location: [facility] OR;  Service: Vascular;  Laterality: N/A;   RIGHT/LEFT HEART CATH AND CORONARY ANGIOGRAPHY N/A 06/11/2023   Procedure: RIGHT/LEFT HEART CATH AND CORONARY ANGIOGRAPHY;  Surgeon: Yvonne Kendall, MD;  Location: [facility] INVASIVE CV LAB;  Service: Cardiovascular;  Laterality: N/A;    Social History:   reports that she quit smoking about 39 years ago. Her smoking use included cigarettes. She started smoking about 59 years ago. She has a 20 pack-year smoking history. She has never used smokeless tobacco. She reports that she does not drink alcohol and does not use drugs.   Family History:  Her family history includes Breast cancer in her sister; CAD in her father and sister; Cancer in her brother; Diabetes in her mother; Heart attack in her father; Heart disease in her father; Hypertension in her father; Peripheral vascular disease in her mother.   Allergies Allergies  Allergen Reactions    Sulfa Antibiotics Other (See Comments)   Sulfa Drugs Cross Reactors Other (See Comments)    Doesn't remember     Home Medications  Prior to Admission medications   Medication Sig Start Date End Date Taking? Authorizing Provider  acetaminophen (TYLENOL) 650 MG CR tablet Take 1,300 mg by mouth every 8 (eight) hours as needed for pain.    [provider]  albuterol (VENTOLIN HFA) 108 (90 Base) MCG/ACT inhaler Inhale 1-2 puffs into the lungs every 6 (six) hours as needed for wheezing or shortness of breath. 12/15/21   Franne Forts, DO  apixaban (ELIQUIS) 5 MG TABS tablet Take 1 tablet (5 mg total) by mouth 2 (two) times daily. 10/02/22   Tresa Moore, MD  B Complex-C-Zn-Folic Acid (DIALYVITE/ZINC) TABS Take 1 tablet by mouth daily.    [provider]  bisacodyl (DULCOLAX) 5 MG EC tablet Take 2 tablets (10 mg total) by mouth daily as needed for moderate constipation. 10/02/22   Tresa Moore, MD  cinacalcet (SENSIPAR) 30 MG tablet Take 30 mg by mouth daily with supper.    [provider]  ezetimibe (ZETIA) 10 MG tablet Take 1 tablet (10 mg total) by mouth daily. 04/30/22   Iran Ouch, MD  ferric citrate (AURYXIA) 1 GM 210 MG(Fe) tablet Take 420 mg by mouth 3 (three) times daily with meals.    [provider]  fexofenadine (ALLEGRA) 180 MG tablet Take 180 mg by mouth daily.    [provider]  midodrine (PROAMATINE) 10 MG tablet Take 20 mg by mouth daily. (Take additional two tablets [20mg ] for administration at dialysis on Tuesday, Thursday and Saturday)    [provider]  midodrine (PROAMATINE) 10 MG tablet Take 20 mg by mouth daily  as needed (for SBP <100).    [provider]  Omega-3 Fatty Acids (FISH OIL) 1000 MG CPDR Take 1,000 mg by mouth daily.    [provider]  omeprazole (PRILOSEC) 20 MG capsule Take 1 capsule (20 mg total) by mouth daily. Patient taking differently: Take 20 mg by mouth 2 (two) times  daily. 02/21/22   Arnette Felts, FNP  ondansetron (ZOFRAN-ODT) 4 MG disintegrating tablet Take 4 mg by mouth every 8 (eight) hours as needed for nausea or vomiting.    [provider]  polyethylene glycol (MIRALAX / GLYCOLAX) 17 g packet Take 17 g by mouth at bedtime.    [provider]  pregabalin (LYRICA) 100 MG capsule Take 100 mg by mouth at bedtime.    [provider]  pregabalin (LYRICA) 50 MG capsule Take 50 mg by mouth 2 (two) times daily.    [provider]  rosuvastatin (CRESTOR) 10 MG tablet Take 1 tablet (10 mg total) by mouth daily. 10/03/22   Tresa Moore, MD  sennosides-docusate sodium (SENOKOT-S) 8.6-50 MG tablet Take 1 tablet by mouth 2 (two) times daily.    [provider]  trolamine salicylate (ASPERCREME) 10 % cream Apply 1 Application topically 2 (two) times daily. (Apply to knees)    [provider]    Critical care time:   Critical care provider statement:   Total critical care time: 33 minutes   Performed by: Karna Christmas MD   Critical care time was exclusive of separately billable procedures and treating other patients.   Critical care was necessary to treat or prevent imminent or life-threatening deterioration.   Critical care was time spent personally by me on the following activities: development of treatment plan with patient and/or surrogate as well as nursing, discussions with consultants, evaluation of patient's response to treatment, examination of patient, obtaining history from patient or surrogate, ordering and performing treatments and interventions, ordering and review of laboratory studies, ordering and review of radiographic studies, pulse oximetry and re-evaluation of patient's condition.    Vida Rigger, M.D.  Pulmonary & Critical Care Medicine

## 2023-06-17 NOTE — Progress Notes (Signed)
[facility]  ROUNDING NOTE   Subjective:   Patient resting quietly Partially completed breakfast  Levo weaned off  Objective:  Vital signs in last 24 hours:  Temp:  [98 F (36.7 C)-98.6 F (37 C)] 98.3 F (36.8 C) (07/16 0800) Pulse Rate:  [48-63] 58 (07/15 1800) Resp:  [12-18] 15 (07/16 0710) SpO2:  [92 %-98 %] 95 % (07/15 1920) Arterial Line BP: (108-183)/(23-41) 113/33 (07/16 0710) Weight:  [120.7 kg] 120.7 kg (07/16 0500)  Weight change: 0.5 kg Filed Weights   06/15/23 0500 06/16/23 0500 06/17/23 0500  Weight: 120.8 kg 120.2 kg 120.7 kg    Intake/Output: I/O last 3 completed shifts: In: 1096.9 [P.O.:240; I.V.:856.9] Out: -    Intake/Output this shift:  Total I/O In: 25.8 [I.V.:25.8] Out: -   Physical Exam: General: NAD, laying in bed  Head: Normocephalic, atraumatic. Moist oral mucosal membranes  Eyes: Anicteric  Lungs:  Clear to auscultation, normal effort  Heart: Regular rate and rhythm  Abdomen:  Soft, nontender  Extremities: no peripheral edema.  Neurologic: Alert and oriented, moving all four extremities  Skin: No lesions  Access: Lt AVF    Basic Metabolic Panel: Recent Labs  Lab 06/11/23 1722 06/12/23 0211 06/13/23 0604 06/14/23 0405 06/15/23 0438 06/16/23 0414 06/17/23 0508  NA 131* 131* 132* 131* 132* 130* 129*  K 3.8 3.3* 4.1 4.6 3.8 4.5 4.6  CL 95* 92* 94* 94* 96* 94* 93*  CO2 24 27 27 26 27 27 24   GLUCOSE 118* 202* 243* 167* 206* 132* 136*  BUN 46* 29* 33* 56* 38* 57* 78*  CREATININE 6.22* 4.80* 4.60* 5.56* 4.10* 5.64* 7.25*  CALCIUM 7.1* 7.6* 7.7* 7.8* 7.8* 7.8* 7.8*  MG 1.5* 2.0 2.2  --  1.8 1.8  --   PHOS 3.7 2.6 1.9* 2.2* 2.2* 3.9 4.6    Liver Function Tests: Recent Labs  Lab 06/12/23 0211 06/14/23 0405 06/15/23 0438 06/16/23 0414 06/17/23 0508  ALBUMIN 2.9* 2.9* 2.9* 3.1* 2.9*   No results for input(s): "LIPASE", "AMYLASE" in the last 168 hours.  No results for input(s): "AMMONIA" in the last 168  hours.  CBC: Recent Labs  Lab 06/13/23 0604 06/14/23 0753 06/15/23 0438 06/16/23 0414 06/17/23 0508  WBC 7.8 12.9* 12.5* 12.0* 10.8*  HGB 10.3* 10.1* 10.0* 9.7* 9.2*  HCT 32.2* 31.9* 32.2* 30.4* 29.1*  MCV 97.9 98.5 99.1 99.0 97.0  PLT 107* 140* 148* 153 145*    Cardiac Enzymes: No results for input(s): "CKTOTAL", "CKMB", "CKMBINDEX", "TROPONINI" in the last 168 hours.  BNP: Invalid input(s): "POCBNP"  CBG: Recent Labs  Lab 06/16/23 1122 06/16/23 1628 06/16/23 2145 06/16/23 2325 06/17/23 0754  GLUCAP 129* 146* 175* 146* 105*    Microbiology: Results for orders placed or performed during the hospital encounter of 06/07/23  Blood culture (routine x 2)     Status: None   Collection Time: 06/07/23 12:54 PM   Specimen: BLOOD  Result Value Ref Range Status   Specimen Description BLOOD RIGHT ANTECUBITAL  Final   Special Requests   Final    BOTTLES DRAWN AEROBIC AND ANAEROBIC Blood Culture adequate volume   Culture   Final    NO GROWTH 5 DAYS Performed at [facility], [address], [place], [place] [address]    Report Status 06/12/2023 FINAL  Final  Blood culture (routine x 2)     Status: None   Collection Time: 06/07/23 12:54 PM   Specimen: BLOOD  Result Value Ref Range Status  Specimen Description BLOOD BLOOD RIGHT FOREARM  Final   Special Requests   Final    BOTTLES DRAWN AEROBIC AND ANAEROBIC Blood Culture adequate volume   Culture   Final    NO GROWTH 5 DAYS Performed at [facility], [address] [place], [place] [address]    Report Status 06/12/2023 FINAL  Final  MRSA Next Gen by PCR, Nasal     Status: None   Collection Time: 06/07/23 10:18 PM   Specimen: Nasal Mucosa; Nasal Swab  Result Value Ref Range Status   MRSA by PCR Next Gen NOT DETECTED NOT DETECTED Final    Comment: (NOTE) The GeneXpert MRSA Assay (FDA approved for NASAL specimens only), is one component of a comprehensive MRSA colonization surveillance program.  It is not intended to diagnose MRSA infection nor to guide or monitor treatment for MRSA infections. Test performance is not FDA approved in patients less than 79 years old. Performed at [facility], [address] [place], [place] [address]     Coagulation Studies: No results for input(s): "LABPROT", "INR" in the last 72 hours.  Urinalysis: No results for input(s): "COLORURINE", "LABSPEC", "PHURINE", "GLUCOSEU", "HGBUR", "BILIRUBINUR", "KETONESUR", "PROTEINUR", "UROBILINOGEN", "NITRITE", "LEUKOCYTESUR" in the last 72 hours.  Invalid input(s): "APPERANCEUR"    Imaging: No results found.   Medications:    sodium chloride Stopped (06/10/23 1900)   sodium chloride     anticoagulant sodium citrate     heparin 1,250 Units/hr (06/17/23 0903)   norepinephrine (LEVOPHED) Adult infusion Stopped (06/17/23 0245)    Chlorhexidine Gluconate Cloth  6 each Topical QHS   droxidopa  100 mg Oral TID WC   ezetimibe  10 mg Oral Daily   feeding supplement (NEPRO CARB STEADY)  237 mL Oral TID BM   fludrocortisone  0.2 mg Oral BID   insulin aspart  0-20 Units Subcutaneous TID WC   insulin aspart  0-5 Units Subcutaneous QHS   insulin glargine-yfgn  5 Units Subcutaneous Daily   loratadine  10 mg Oral Daily   midodrine  15 mg Oral TID WC   multivitamin  1 tablet Oral QHS   pregabalin  100 mg Oral QHS   pregabalin  50 mg Oral BID   rosuvastatin  10 mg Oral Daily   senna-docusate  1 tablet Oral BID   sodium chloride flush  3 mL Intravenous Q12H   sodium chloride, acetaminophen, alteplase, alum & mag hydroxide-simeth, anticoagulant sodium citrate, bisacodyl, docusate sodium, guaiFENesin-dextromethorphan, heparin, HYDROcodone-acetaminophen, lidocaine (PF), lidocaine-prilocaine, morphine injection, nitroGLYCERIN, ondansetron (ZOFRAN) IV, mouth rinse, pentafluoroprop-tetrafluoroeth, polyethylene glycol, sodium chloride flush  Assessment/ Plan:  [name] is a 77 y.o.  female   with a PMHx of ESRD on HD TTS at [facility], diabetes mellitus type 2, chronic hypotension, peripheral arterial disease, obstructive sleep apnea, GERD, obesity, hyperlipidemia, anemia chronic kidney disease, secondary hyperparathyroidism, who was admitted to [facility] on 06/07/2023 for evaluation of generalized weakness.   1  ESRD on HD TTS: remains on norepinephrine.  - Continue  TTS schedule.  - Dialysis scheduled for later today   2.  Hypotension with severe pulmonary hypertension..  Chronic in nature.  Systolic blood pressure usually in the 80s as an outpatient.   - midodrine 10 mg 3 times daily as outpatient.    - vasopressors: weaned off  3. Anemia of chronic kidney disease Lab Results  Component Value Date   HGB 9.2 (L) 06/17/2023   Hgb remains stable Continue Mircera as an outpatient.  4. Diabetes mellitus type II with chronic kidney disease/renal manifestations: noninsulin dependent.  Most recent hemoglobin A1c is 5.8 on 06/07/23.  - Glucose well controlled.  - continue glucose control.   5. Secondary Hyperparathyroidism:   with hypocalcemia and hypophosphatemia.   Lab Results  Component Value Date   PTH 388.6 (H) 01/17/2014   CALCIUM 7.8 (L) 06/17/2023   CAION 0.92 (L) 06/11/2023   PHOS 4.6 06/17/2023  Phosphorus within target, calcium slightly decreased.  Holding Destrehan and Sensipar     LOS: 10 Neven Fina 7/16/202410:34 AM

## 2023-06-17 NOTE — Progress Notes (Signed)
ANTICOAGULATION CONSULT NOTE  Pharmacy Consult for IV heparin Indication: atrial fibrillation  Allergies  Allergen Reactions   Sulfa Antibiotics Other (See Comments)   Sulfa Drugs Cross Reactors Other (See Comments)    Doesn't remember     Patient Measurements: Height: 5\' 4"  (162.6 cm) Weight: 120.7 kg (266 lb 1.5 oz) IBW/kg (Calculated) : 54.7 Heparin Dosing Weight: 115.5 kg  Vital Signs: Temp: 98.2 F (36.8 C) (07/16 0500) Temp Source: Oral (07/16 0500)  Labs: Recent Labs    06/15/23 0438 06/16/23 0414 06/17/23 0508  HGB 10.0* 9.7* 9.2*  HCT 32.2* 30.4* 29.1*  PLT 148* 153 145*  HEPARINUNFRC 0.44 0.40 0.35  CREATININE 4.10* 5.64* 7.25*    Estimated Creatinine Clearance: 8.1 mL/min (A) (by C-G formula based on SCr of 7.25 mg/dL (H)).   Medical History: Past Medical History:  Diagnosis Date   Arthritis    "left knee" (12/04/2016)   CAD (coronary artery disease)    a. 12/2016 NSTEMI/PCI: LM nl, LaD 20p/d, D1 20, OM2 20, OM3 20, RCA 33m (3.0x18 Resolute DES). EF 65%.   Chronic lower back pain    Diverticulosis    on CT   ESRD on dialysis [facility])    GERD (gastroesophageal reflux disease)    Hiatal hernia    History of echocardiogram    a. 09/2022 Echo: EF 60-65%, mild asymm LVH, mod reduced RV fxn, RVSP . Mildly dil RA. Mild AI. Ao sclerosis. Mild MR.   Hyperlipidemia    Hypertension    a. 12/2016 labetalol d/c'd 2/2 hypotension in HD.   Morbid obesity (HCC)    Obesity    Persistent atrial fibrillation (HCC)    a. 05/2023 Zio: 100% afib avg HR 52 (32-99).   Pneumonia    "couple times" (12/04/2016)   Type II diabetes mellitus (HCC)     Medications:  Eliquis prior to admission (last dose 7/7 @2100 )  Assessment: 77 year old female admitted with circulatory shock, of which has improved. Plan is for PPM, and thus holding Eliquis prior to procedure.  7/16 0508 HL 0.35    Goal of Therapy:  anti-Xa 0.3-0.7 Monitor platelets by anticoagulation protocol:  Yes    Plan:  Heparin level is therapeutic. Will continue heparin infusion at 1250 units/hr. Recheck heparin level and CBC with AM labs.   Paschal Dopp, PharmD, 06/17/2023 6:02 AM

## 2023-06-17 NOTE — Progress Notes (Signed)
Patient Name: Jacqueline [name] Date of Encounter: 06/17/2023 [facility] Cardiologist: Lorine Bears, MD   Interval Summary  .    No chest pain, dyspnea, palpitations, dizziness, recent appendectomy, or syncope.  Off norepinephrine drip with stable BP.  Remains on fludrocortisone, midodrine, and droxidopa.  She reports nephrology is planning for dialysis today.  Vital Signs .    Vitals:   06/17/23 0500 06/17/23 0600 06/17/23 0710 06/17/23 0800  BP:      Pulse:      Resp: 13 14 15    Temp: 98.2 F (36.8 C)   98.3 F (36.8 C)  TempSrc: Oral   Oral  SpO2:      Weight: 120.7 kg     Height:        Intake/Output Summary (Last 24 hours) at 06/17/2023 0825 Last data filed at 06/17/2023 0659 Gross per 24 hour  Intake 844.12 ml  Output --  Net 844.12 ml      06/17/2023    5:00 AM 06/16/2023    5:00 AM 06/15/2023    5:00 AM  Last 3 Weights  Weight (lbs) 266 lb 1.5 oz 264 lb 15.9 oz 266 lb 5.1 oz  Weight (kg) 120.7 kg 120.2 kg 120.8 kg      Telemetry/ECG    Afib with ventricular rates ranging from the 50s to the 60s - Personally Reviewed  Physical Exam .    VS:  BP (!) 119/31 (BP Location: Other (Comment)) Comment (BP Location): right A-line  Pulse (!) 58   Temp 98.3 F (36.8 C) (Oral)   Resp 15   Ht 5\' 4"  (1.626 m)   Wt 120.7 kg   SpO2 95%   BMI 45.68 kg/m  , BMI Body mass index is 45.68 kg/m. GENERAL: Chronically ill-appearing.  No acute distress. HEENT: Pupils equal round and reactive, fundi not visualized, oral mucosa unremarkable NECK:  No jugular venous distention, waveform within normal limits, carotid upstroke brisk and symmetric, no bruits, no thyromegaly LUNGS:  Clear to auscultation bilaterally HEART: Bradycardic.  IRIR.  PMI not displaced or sustained,S1 and S2 within normal limits, no S3, no S4, no clicks, no rubs, no murmurs ABD:  Flat, positive bowel sounds normal in frequency in pitch, no bruits, no rebound, no guarding, no midline  pulsatile mass, no hepatomegaly, no splenomegaly EXT:  2 plus pulses throughout, no edema, no cyanosis no clubbing SKIN:  No rashes no nodules NEURO:  Cranial nerves II through XII grossly intact, motor grossly intact throughout PSYCH:  Cognitively intact, oriented to person place and time   Assessment & Plan .     # Bradycardia: # Hypotension: Bradycardia does not seem to be causing hypotension and thus there is no benefit from a pacemaker.   She is on midodrine, fludrocortisone and droxidopa.  BP stable off norepinephrine.  See how blood pressure trends while undergoing dialysis today off norepinephrine.  Given her low diastolic BP there was concern for low SVR secondary to fistula and shunting. However, on ultrasound 06/13/23, flow was found to be in acceptable range.   # CAD s/p RCA PCI: # Hyperlipidemia: Cath this admission showed patent RCA stent and mild-mod non-obstructive CAD.  Severely elevated R heart and pulmonary pressures.  Continue rosuvastatin and Zetia.  # Severe pulmonary HTN: LV function is normal and RV moderately reduced on echo.  RHC demonstrated severely elevated pulmonary pressure as below.  Pulmonary pressure did not exceed systemic pressure.  Volume management challenging given her hypotension.  Volume is managed by nephrology and dialysis.  We did discuss with advanced heart failure to see if she is a candidate for pulmonary hypertension treatment but given that she is on dialysis, she is not a candidate.  # ESRD on HD:  Per nephrology.  # Persistent atrial fibrillation:  Bradycardic as above.  Therefore not on any nodal agents.  She remains on IV heparin.  She can be switched back to Eliquis from our standpoint.  # Mod-severe TR:  Not a good candidate for repair given RV dysfuction and pulmonary HTN.  # OSA:  Patient denies diagnosis of OSA and reports that she does not use a CPAP at home.  Consider outpatient sleep study.    For questions or updates,  please contact [facility] Please consult [email or web address] for contact info under     Signed, Eula Listen, PA-C

## 2023-06-17 NOTE — Progress Notes (Signed)
Patient returned to unit with transporter from HD. HD called report to this RN. Vitals WDL at time of return to unit. Son at bedside. NP to update patient and son per family request. Patient received dinner, repositioned in bed, provided ordered medications and comfort addressed. All personal items within reach of patient and needs met at this time.

## 2023-06-17 NOTE — Progress Notes (Signed)
Daily Progress Note   Patient Name: Jacqueline [name]       Date: 06/17/2023 DOB: May 19, 1946  Age: 77 y.o. MRN#: [id number] Attending Physician: Vida Rigger, MD Primary Care Physician: Macky Lower, MD Admit Date: 06/07/2023  Reason for Consultation/Follow-up: Establishing goals of care  Subjective: Notes and labs reviewed.  Into see patient.  She is resting with eyes closed but awakens to talk.  She states she feels like things are going better for her.  She states her ability to tolerate dialysis is improved.  She states it is her understanding that her heart is doing well.  She has lived in a facility for the past year.  She states at home she uses 2 L of oxygen at baseline.  She uses a wheelchair to get back and forth to dialysis, and uses a walker to go to the bath room.  We discussed her current 5 L of oxygen.  She denies any shortness of breath at baseline, even with activity.   We reviewed her diagnoses and discussed limitations of medical interventions to prolong quality of life in some situations and discussed the concept of human mortality.  PMT will follow.     Length of Stay: 10  Current Medications: Scheduled Meds:   Chlorhexidine Gluconate Cloth  6 each Topical QHS   droxidopa  100 mg Oral TID WC   ezetimibe  10 mg Oral Daily   feeding supplement (NEPRO CARB STEADY)  237 mL Oral TID BM   fludrocortisone  0.2 mg Oral BID   insulin aspart  0-20 Units Subcutaneous TID WC   insulin aspart  0-5 Units Subcutaneous QHS   insulin glargine-yfgn  5 Units Subcutaneous Daily   loratadine  10 mg Oral Daily   midodrine  15 mg Oral TID WC   multivitamin  1 tablet Oral QHS   pregabalin  100 mg Oral QHS   pregabalin  50 mg Oral BID   rosuvastatin  10 mg Oral Daily    senna-docusate  1 tablet Oral BID   sodium chloride flush  3 mL Intravenous Q12H    Continuous Infusions:  sodium chloride Stopped (06/10/23 1900)   sodium chloride     anticoagulant sodium citrate     heparin 1,250 Units/hr (06/17/23 0903)   norepinephrine (LEVOPHED) Adult infusion Stopped (06/17/23  0245)    PRN Meds: sodium chloride, acetaminophen, alteplase, alum & mag hydroxide-simeth, anticoagulant sodium citrate, bisacodyl, docusate sodium, guaiFENesin-dextromethorphan, heparin, HYDROcodone-acetaminophen, lidocaine (PF), lidocaine-prilocaine, morphine injection, nitroGLYCERIN, ondansetron (ZOFRAN) IV, mouth rinse, pentafluoroprop-tetrafluoroeth, polyethylene glycol, sodium chloride flush  Physical Exam Pulmonary:     Effort: Pulmonary effort is normal.  Neurological:     Mental Status: She is alert.             Vital Signs: BP (!) 119/31 (BP Location: Other (Comment)) Comment (BP Location): right A-line  Pulse (!) 58   Temp 98.3 F (36.8 C) (Oral)   Resp 15   Ht 5\' 4"  (1.626 m)   Wt 120.7 kg   SpO2 95%   BMI 45.68 kg/m  SpO2: SpO2: 95 % O2 Device: O2 Device: Nasal Cannula O2 Flow Rate: O2 Flow Rate (L/min): (S) 5 L/min  Intake/output summary:  Intake/Output Summary (Last 24 hours) at 06/17/2023 1013 Last data filed at 06/17/2023 1610 Gross per 24 hour  Intake 370.9 ml  Output --  Net 370.9 ml   LBM: Last BM Date : 06/13/23 Baseline Weight: Weight: 114.8 kg Most recent weight: Weight: 120.7 kg   Patient Active Problem List   Diagnosis Date Noted   Goals of care, counseling/discussion 06/12/2023   Severe pulmonary hypertension (HCC) 06/10/2023   Morbid obesity (HCC) 06/08/2023   Symptomatic bradycardia 06/07/2023   Hyperkalemia 09/30/2022   Constipation 09/30/2022   AMS (altered mental status) 09/28/2022   Hypotension    Permanent atrial fibrillation (HCC)    Pressure injury of skin 05/22/2022   General weakness 05/22/2022   Weakness 05/21/2022    Anaphylactic shock, unspecified, initial encounter 06/29/2020   Sepsis (HCC) 06/22/2019   Pain due to onychomycosis of toenails of both feet 06/11/2019   Callus 06/11/2019   Other disorders of phosphorus metabolism 05/20/2019   Mixed hyperlipidemia 03/22/2019   Otalgia of both ears 03/22/2019   Right arm pain 03/22/2019   Bradycardia 10/01/2017   Dyslipidemia 10/01/2017   Dependence on renal dialysis (HCC) 09/01/2017   Chest pain, unspecified 12/06/2016   Non-ST elevation (NSTEMI) myocardial infarction (HCC) 12/06/2016   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   History of NSTEMI    CAD (coronary artery disease) 12/03/2016   Hypokalemia 05/03/2016   Pruritus, unspecified 03/27/2015   Encounter for removal of sutures 11/30/2014   Pain in lower limb 08/26/2014   Encounter for immunization 06/10/2014   Unspecified protein-calorie malnutrition (HCC) 02/28/2014   Allergy, unspecified, initial encounter 02/15/2014   HCAP (healthcare-associated pneumonia) 02/13/2014   Absence of both cervix and uterus, acquired 02/01/2014   Coagulation defect, unspecified (HCC) 02/01/2014   Dyspnea, unspecified 02/01/2014   Family history of diabetes mellitus 02/01/2014   Family history of ischemic heart disease and other diseases of the circulatory system 02/01/2014   Fever, unspecified 02/01/2014   Headache, unspecified 02/01/2014   History of allergy to sulfonamides 02/01/2014   Iron deficiency anemia, unspecified 02/01/2014   Long term (current) use of insulin (HCC) 02/01/2014   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   OSA (obstructive sleep apnea) 01/21/2014   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HCC) 01/21/2014   Acute encephalopathy 01/19/2014   Congestive heart failure (HCC) 01/15/2014   End stage renal disease (HCC) 01/15/2014   Hypertension 01/15/2014   Diabetes mellitus (HCC) 01/15/2014   Anemia 01/15/2014   Onychomycosis 03/26/2013   Pain in joint, ankle and foot 03/26/2013     Palliative Care Assessment & Plan    Recommendations/Plan: PMT will follow-up  Code Status:    Code Status Orders  (From admission, onward)           Start     Ordered   06/11/23 1358  Full code  Continuous       Question:  By:  Answer:  Consent: discussion documented in EHR   06/11/23 1358           Code Status History     Date Active Date Inactive Code Status Order ID Comments User Context   06/07/2023 1926 06/11/2023 1358 Full Code [id number]  Jimmye Norman, NP ED   09/28/2022 1410 10/02/2022 1618 Full Code [id number]  Lucile Shutters, MD ED   05/22/2022 0052 05/30/2022 2329 Full Code [id number]  Lurline Del, MD ED   06/22/2019 1441 06/24/2019 1328 Full Code [id number]  Carron Curie, MD Inpatient   12/03/2016 1641 12/05/2016 1631 Full Code [id number]  Calvert Cantor, MD ED   01/15/2014 2031 01/28/2014 2059 Full Code [id number]  Ron Parker, MD ED   01/07/2012 0100 01/07/2012 1448 Full Code [id number]  Doug Sou, MD ED      Advance Directive Documentation    Flowsheet Row Most Recent Value  Type of Advance Directive Healthcare Power of Attorney  Pre-existing out of facility DNR order (yellow form or pink MOST form) --  "MOST" Form in Place? --       Prognosis: Poor overall   Care plan was discussed with CCM  Thank you for allowing the Palliative Medicine Team to assist in the care of this patient.   Morton Stall, NP  Please contact Palliative Medicine Team phone at [phone] for questions and concerns.

## 2023-06-17 NOTE — Progress Notes (Signed)
  Received patient in bed to unit.   Informed consent signed and in chart.    TX duration: 3.5hrs     Transported back to floor  Hand-off given to patient's nurse. No c/o and no distress noted    Access used: LAVF Access issues: none   Total UF removed: 1.5L Medication(s) given: 25g albumin, new bag heparin given 12.78ml/hr Post HD VS: 109/44 Post HD weight:      Lynann Beaver  [facility]

## 2023-06-18 DIAGNOSIS — I4821 Permanent atrial fibrillation: Secondary | ICD-10-CM | POA: Diagnosis not present

## 2023-06-18 DIAGNOSIS — R001 Bradycardia, unspecified: Secondary | ICD-10-CM | POA: Diagnosis not present

## 2023-06-18 DIAGNOSIS — I959 Hypotension, unspecified: Secondary | ICD-10-CM | POA: Diagnosis not present

## 2023-06-18 DIAGNOSIS — Z7189 Other specified counseling: Secondary | ICD-10-CM | POA: Diagnosis not present

## 2023-06-18 LAB — RENAL FUNCTION PANEL
Albumin: 3.2 g/dL — ABNORMAL LOW (ref 3.5–5.0)
Anion gap: 12 (ref 5–15)
BUN: 51 mg/dL — ABNORMAL HIGH (ref 8–23)
CO2: 23 mmol/L (ref 22–32)
Calcium: 7.9 mg/dL — ABNORMAL LOW (ref 8.9–10.3)
Chloride: 96 mmol/L — ABNORMAL LOW (ref 98–111)
Creatinine, Ser: 5 mg/dL — ABNORMAL HIGH (ref 0.44–1.00)
GFR, Estimated: 8 mL/min — ABNORMAL LOW (ref >60)
Glucose, Bld: 112 mg/dL — ABNORMAL HIGH (ref 70–99)
Phosphorus: 3.6 mg/dL (ref 2.5–4.6)
Potassium: 4.1 mmol/L (ref 3.5–5.1)
Sodium: 131 mmol/L — ABNORMAL LOW (ref 135–145)

## 2023-06-18 LAB — CBC
HCT: 29 % — ABNORMAL LOW (ref 36.0–46.0)
Hemoglobin: 9.1 g/dL — ABNORMAL LOW (ref 12.0–15.0)
MCH: 31.5 pg (ref 26.0–34.0)
MCHC: 31.4 g/dL (ref 30.0–36.0)
MCV: 100.3 fL — ABNORMAL HIGH (ref 80.0–100.0)
Platelets: 141 10*3/uL — ABNORMAL LOW (ref 150–400)
RBC: 2.89 MIL/uL — ABNORMAL LOW (ref 3.87–5.11)
RDW: 15.5 % (ref 11.5–15.5)
WBC: 9.5 10*3/uL (ref 4.0–10.5)
nRBC: 0 % (ref 0.0–0.2)

## 2023-06-18 LAB — HEPARIN LEVEL (UNFRACTIONATED)
Heparin Unfractionated: 0.26 IU/mL — ABNORMAL LOW (ref 0.30–0.70)
Heparin Unfractionated: 0.34 IU/mL (ref 0.30–0.70)
Heparin Unfractionated: 0.35 IU/mL (ref 0.30–0.70)

## 2023-06-18 LAB — GLUCOSE, CAPILLARY
Glucose-Capillary: 105 mg/dL — ABNORMAL HIGH (ref 70–99)
Glucose-Capillary: 119 mg/dL — ABNORMAL HIGH (ref 70–99)
Glucose-Capillary: 167 mg/dL — ABNORMAL HIGH (ref 70–99)
Glucose-Capillary: 160 mg/dL — ABNORMAL HIGH (ref 70–99)
Glucose-Capillary: 132 mg/dL — ABNORMAL HIGH (ref 70–99)

## 2023-06-18 LAB — MAGNESIUM: Magnesium: 1.9 mg/dL (ref 1.7–2.4)

## 2023-06-18 NOTE — Progress Notes (Signed)
NAME:  Jacqueline [name], MRN:  [id number], DOB:  29-Aug-1946, LOS: 11 ADMISSION DATE:  06/07/2023, CONSULTATION DATE:  06/07/2023 REFERRING MD:  Dr. Allena Katz, CHIEF COMPLAINT:  Hypotension   Brief Pt Description / Synopsis:  77 y.o woman with PMHx significant for chronic hypotension (SBP in 80's on Midodrine 10 mg TID) and ESRD on HD (TTS) admitted with Cardiogenic shock due to Bradycardia.   History of Present Illness:  77 y.o female with significant PMHx significant for HTN, T2DM, ESRD on HD TTS, Chronic debility, CAD s/p stent, PAF, OSA, PAD, Left knee arthritis, Chronic lower back pain, GERD, Obesity, HLD who presented to the ED from Dialysis with generalized weakness x 4 days.   EMS report: NVD and LLQ abdominal pain since last night. States tried to eat yesterday, each time she ate she vomited, then had diarrhea last night. No NVD today. She went to Dialysis today and was noted with low BP of 72/42. She usually runs lows BP and takes Midodrine but today her BP was lower than usually so she was not dialyzed but was sent to ED for further evaluation.   ED Course:  Initial vital signs showed HR of 50 beats/minute, BP 81/51 mm Hg, the RR 16 breaths/minute, and the oxygen saturation 91% on 2L and a temperature of 97.14F (36.6C).   Pertinent Labs/Diagnostics.   Na+/ K+: 133/3.4 Glucose:130 BUN/Cr.:55/7.76 WBC:11.5 Plts: 146 PCT: pending Lactic acid: 1.3 EKG:?Afib vs junctional bradycardia rate 40bmp. Incomplete RBB,   CXR, CT scan obtained and show no signs of infection or other abnormality. Patient given, midodrine, atropine for significant bradycardia and  500 cc of fluids and started on broad-spectrum antibiotics Vanco cefepime and Flagyl for  suspected sepsis of unknown source. Patient remained hypotensive despite  intervention as above. PCCM consulted for possible pressors.  Pertinent Medical History:   Past Medical History:  Diagnosis Date   Arthritis    "left knee" (12/04/2016)   CAD  (coronary artery disease)    a. 12/2016 NSTEMI/PCI: LM nl, LaD 20p/d, D1 20, OM2 20, OM3 20, RCA 47m (3.0x18 Resolute DES). EF 65%.   Chronic lower back pain    Diverticulosis    on CT   ESRD on dialysis [facility])    GERD (gastroesophageal reflux disease)    Hiatal hernia    History of echocardiogram    a. 09/2022 Echo: EF 60-65%, mild asymm LVH, mod reduced RV fxn, RVSP . Mildly dil RA. Mild AI. Ao sclerosis. Mild MR.   Hyperlipidemia    Hypertension    a. 12/2016 labetalol d/c'd 2/2 hypotension in HD.   Morbid obesity (HCC)    Obesity    Persistent atrial fibrillation (HCC)    a. 05/2023 Zio: 100% afib avg HR 52 (32-99).   Pneumonia    "couple times" (12/04/2016)   Type II diabetes mellitus (HCC)    Micro Data:  7/6: Blood culture x2>> negative 7/6: MRSA PCR >> negative  Antimicrobials:   Anti-infectives (From admission, onward)    Start     Dose/Rate Route Frequency Ordered Stop   06/10/23 1200  vancomycin (VANCOCIN) IVPB 1000 mg/200 mL premix  Status:  Discontinued       Placed in "Followed by" Linked Group   1,000 mg 200 mL/hr over 60 Minutes Intravenous Every T-Th-Sa (Hemodialysis) 06/08/23 1058 06/09/23 1029   06/08/23 1145  vancomycin (VANCOCIN) IVPB 1000 mg/200 mL premix       Placed in "Followed by" Linked Group   1,000 mg 200  mL/hr over 60 Minutes Intravenous  Once 06/08/23 1058 06/08/23 1655   06/08/23 1100  piperacillin-tazobactam (ZOSYN) IVPB 2.25 g  Status:  Discontinued        2.25 g 100 mL/hr over 30 Minutes Intravenous Every 8 hours 06/08/23 1058 06/09/23 1029   06/07/23 1400  metroNIDAZOLE (FLAGYL) IVPB 500 mg        500 mg 100 mL/hr over 60 Minutes Intravenous  Once 06/07/23 1346 06/07/23 1652   06/07/23 1400  ceFEPIme (MAXIPIME) 2 g in sodium chloride 0.9 % 100 mL IVPB        2 g 200 mL/hr over 30 Minutes Intravenous  Once 06/07/23 1349 06/07/23 1512   06/07/23 1400  vancomycin (VANCOCIN) IVPB 1000 mg/200 mL premix        1,000 mg 200 mL/hr over 60  Minutes Intravenous  Once 06/07/23 1349 06/07/23 1652      Significant Hospital Events: Including procedures, antibiotic start and stop dates in addition to other pertinent events   06/07/2023: admit to ICU with bradycardia, started on dopamine 06/08/2023: weaned off dopamine, hypotensive again, dopamine restarted 06/09/2023:  Remains hypotensive requiring Levophed, (pt is vasculopath, doubt accuracy of cuff BP as pt is awake, alert, and asymptomatic).  Attempted Arterial line but unsuccessful, Vascular Surgery consulted to assist with placement. Nephrology consulted, received HD. 06/10/2023: Continues to require Levophed (6 mcg currently), when Levo weaned down does exhibit some Bradycardia in the upper 50's.  Cardiology considering R Heart Cath today. Holding on transfer to [facility] for pacer at this time. 06/11/2023: Levophed weaning down, this AM. LHC/RHC with mild-moderate non-obstructive CAD, patent proximal RCA stent, mild-moderately elevated L heart filling pressures, severely elevated R heart/pulm artery pressures, mildly reduced CO/CI. Episode of chest pressure with HD, resolved with morphine. 06/12/2023: Additional episodes of chest tightness/pressure overnight (0150 and 0554). EKG without changes, trops flat. Hypertensive to 227/80, then with NE off HR dropped to 29.  Droxidopa was eventually stopped. 06/13/2023: No events overnight, no chest pain or hypertension. Remains on Levophed.  Decrease Midodrine to 5 mg TID and restart Droxidopa, increase Florinef and stop Solu-cortef in attempts to wean Levophed off. Obtain CT Chest to rule out Pulmonary pathology for etiology of PH. 06/14/2023: chest pain yesterday. Re-started on levophed over night. CT shows no acute finding of ILD. Droxidopa re-started yesterday. 06/15/2023: completed dialysis yesterday. Re-started again on levophed.  06/16/23- met with cardiology to discuss treatment options.  At this time patient remains severely hypotensive on  maximal medical management. Her Renal impairment and hypotension limit therapy.  Recommendation for palliative care at this time due to poor prognosis. 06/17/23- patient remains hypotensive with poor prognosis. She is requiring less vasopressor support.  06/18/23- patient is off vasopressors.  Still has overall very poor prognosis.  Recommendation for palliative/hospice.  Interim History / Subjective:  -Pt is sleepy  -Levophed re-started overnight  Objective:  Blood pressure (!) 91/38, pulse (!) 50, temperature 98.6 F (37 C), temperature source Oral, resp. rate 16, height 5\' 4"  (1.626 m), weight 121.6 kg, SpO2 93%.        Intake/Output Summary (Last 24 hours) at 06/18/2023 0933 Last data filed at 06/18/2023 0500 Gross per 24 hour  Intake 235 ml  Output 1500 ml  Net -1265 ml   Filed Weights   06/16/23 0500 06/17/23 0500 06/18/23 0425  Weight: 120.2 kg 120.7 kg 121.6 kg   Physical Examination: General: Chronically ill-appearing elderly woman in NAD. Pleasant and conversant, eating breakfast. HEENT: [place]/AT, anicteric  sclera, PERRL, dry mucous membranes. Neuro: Awake, oriented x 4. Responds to verbal stimuli. Following commands consistently. Moves all 4 extremities spontaneously. Generalized weakness. CV: Variable HR, irregularly irregular rhythm, no m/g/r. PULM: Breathing even and unlabored on 2LNC. Lung fields CTAB in upper fields, faint bibasilar crackles. GI: Soft, nontender, nondistended. Normoactive bowel sounds. Extremities: No significant LE edema noted. Skin: Warm/dry, no rashes.  Resolved Hospital Problem List:     Assessment & Plan:   #Circulatory Shock: Suspect Cardiogenic given Bradycardia and hypotension with PH #Afib with slow ventricular rate, improved #Pulmonary Hypertension #Moderate Tricuspid Regurgitation History of CAD, HLD, permanent A. Fib, chronic hypotension with SBP in 80's on Midodrine 10 mg TID Echo 7/7 with LVEF 60-65% with diastolic heart failure, RV  systolic function mildly reduced, severe pulmonary hypertension, moderate tricuspid regurgitation. TSH/T4 WNL.  LHC/RHC 7/10 with mild-moderate non-obstructive CAD, patent proximal RCA stent, mild-moderately elevated L heart filling pressures, severely elevated R heart/pulm artery pressures, mildly reduced CO/CI. - Goal MAP > 65 - Avoiding fluid resuscitation in the setting of ESRD/HD - Levophed titrated to goal MAP; does best with low doses/not completely off-titrated; becomes bradycardic when NE completely off - Continue Florinef and increased midodrine - Cardiac monitoring - Optimized electrolytes for K > 4, Mg > 2 - Heparin gtt for now, transition back to Eliquis as able - Secondary prevention with Zetia, Crestor - Appreciate Cardiology/EP input, less likely HOHF due to CI 2.4 on cath - Consideration for leadless PM placement on hold - Deemed not a good candiate for valve repair or MitraClip given degree of PH and RV dysfunction - Unlikely to tolerate PH management - CT showing no evidence of ILD - Continue droxidopa and levophed as needed  #Initial Concern for Sepsis of unknown etiology ~ SEPSIS RULED OUT No clear sign of sepsis though patient is hypotensive. Blood cultures are drawn and are negative to date.  CXR without sign of pneumonia, unable to make urine - Trend WBC, fever curve; leukocytosis improved - Follow Cx data, 7/6 BCx NGTD - Off of antibiotics, low threshold for resumption if WBC uptrends/patient becomes febrile  #ESRD on HD  #Hyponatremia #AG Metabolic Acidosis Outpatient - on HD TThS via LUE AVF); dry weight 115kg. 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 Doubt patient would be able to tolerate PH treatment. No significant CAD to contribute to bradycardia. Less likely HOHF in the setting of CI 2.4.  - PMT consulted, appreciate assistance; will need ongoing GOC discussions if patient unable to tolerate more aggressive interventions -POOR PROGNOSIS , RECOMMEND HOSPICE   Best Practice (right click and "Reselect all SmartList Selections" daily)   Diet/type: Regular DVT prophylaxis: Heparin gtt GI prophylaxis: N/A Lines: Right radial A-line, and is still needed Foley:  N/A Code Status:  full code Last date of multidisciplinary  goals of care discussion [7/12 - patient updated at bedside]  Labs   CBC: Recent Labs  Lab 06/14/23 0753 06/15/23 0438 06/16/23 0414 06/17/23 0508 06/18/23 0400  WBC 12.9* 12.5* 12.0* 10.8* 9.5  HGB 10.1* 10.0* 9.7* 9.2* 9.1*  HCT 31.9* 32.2* 30.4* 29.1* 29.0*  MCV 98.5 99.1 99.0 97.0 100.3*  PLT 140* 148* 153 145* 141*  Basic Metabolic Panel: Recent Labs  Lab 06/12/23 0211 06/13/23 0604 06/14/23 0405 06/15/23 0438 06/16/23 0414 06/17/23 0508 06/18/23 0400  NA 131* 132* 131* 132* 130* 129* 131*  K 3.3* 4.1 4.6 3.8 4.5 4.6 4.1  CL 92* 94* 94* 96* 94* 93* 96*  CO2 27 27 26 27 27 24 23   GLUCOSE 202* 243* 167* 206* 132* 136* 112*  BUN 29* 33* 56* 38* 57* 78* 51*  CREATININE 4.80* 4.60* 5.56* 4.10* 5.64* 7.25* 5.00*  CALCIUM 7.6* 7.7* 7.8* 7.8* 7.8* 7.8* 7.9*  MG 2.0 2.2  --  1.8 1.8  --  1.9  PHOS 2.6 1.9* 2.2* 2.2* 3.9 4.6 3.6  GFR: Estimated Creatinine  Clearance: 11.7 mL/min (A) (by C-G formula based on SCr of 5 mg/dL (H)). Recent Labs  Lab 06/15/23 0438 06/16/23 0414 06/17/23 0508 06/18/23 0400  WBC 12.5* 12.0* 10.8* 9.5  Liver Function Tests: Recent Labs  Lab 06/14/23 0405 06/15/23 0438 06/16/23 0414 06/17/23 0508 06/18/23 0400  ALBUMIN 2.9* 2.9* 3.1* 2.9* 3.2*   No results for input(s): "LIPASE", "AMYLASE" in the last 168 hours.  No results for input(s): "AMMONIA" in the last 168 hours.  ABG:    Component Value Date/Time   PHART 7.313 (L) 06/11/2023 1237   PCO2ART 48.7 (H) 06/11/2023 1237   PO2ART 90 06/11/2023 1237   HCO3 24.7 06/11/2023 1237   TCO2 26 06/11/2023 1237   ACIDBASEDEF 2.0 06/11/2023 1237   O2SAT 96 06/11/2023 1237   Coagulation Profile: No results for input(s): "INR", "PROTIME" in the last 168 hours.  Cardiac Enzymes: No results for input(s): "CKTOTAL", "CKMB", "CKMBINDEX", "TROPONINI" in the last 168 hours.  HbA1C: Hemoglobin A1C  Date/Time Value Ref Range Status  06/12/2021 12:00 AM 6.6  Final   Hgb A1c MFr Bld  Date/Time Value Ref Range Status  06/07/2023 07:44 PM 5.8 (H) 4.8 - 5.6 % Final    Comment:    (NOTE) Pre diabetes:          5.7%-6.4%  Diabetes:              >6.4%  Glycemic control for   <7.0% adults with diabetes   09/29/2022 04:55 PM 5.2 4.8 - 5.6 % Final    Comment:    (NOTE) Pre diabetes:          5.7%-6.4%  Diabetes:              >6.4%  Glycemic control for   <7.0% adults with diabetes    CBG: Recent Labs  Lab 06/17/23 1119 06/17/23 1929 06/17/23 2327 06/18/23 0350 06/18/23 0732  GLUCAP 160* 129* 158* 105* 119*   Past Medical History:  She,  has a past medical history of Arthritis, CAD (coronary artery disease), Chronic lower back pain, Diverticulosis, ESRD on dialysis (HCC), GERD (gastroesophageal reflux disease), Hiatal hernia, History of echocardiogram, Hyperlipidemia, Hypertension, Morbid obesity (HCC), Obesity, Persistent atrial fibrillation (HCC),  Pneumonia, and Type II diabetes mellitus (HCC).   Surgical History:   Past Surgical History:  Procedure Laterality Date   ABDOMINAL HYSTERECTOMY     AV FISTULA PLACEMENT Left 01/27/2014  Procedure: ARTERIOVENOUS (AV) FISTULA CREATION;  Surgeon: Pryor Ochoa, MD;  Location: [facility];  Service: Vascular;  Laterality: Left;   AV FISTULA REPAIR     "had it cleaned out"   CARDIAC CATHETERIZATION  ?1980s   CARDIAC CATHETERIZATION N/A 12/04/2016   Procedure: Left Heart Cath Angiography;  Surgeon: Kathleene Hazel, MD;  Location: [facility] INVASIVE CV LAB;  Service: Cardiovascular;  Laterality: N/A;   CARDIAC CATHETERIZATION N/A 12/04/2016   Procedure: Coronary Stent Intervention;  Surgeon: Kathleene Hazel, MD;  Location: [facility] INVASIVE CV LAB;  Service: Cardiovascular;  Laterality: N/A;  Mid RCA   CATARACT EXTRACTION W/ INTRAOCULAR LENS IMPLANT Left    COLONOSCOPY W/ BIOPSIES AND POLYPECTOMY     CORONARY ANGIOPLASTY WITH STENT PLACEMENT  12/04/2016   ESOPHAGOGASTRODUODENOSCOPY     INSERTION OF DIALYSIS CATHETER N/A 01/19/2014   Procedure: INSERTION OF DIALYSIS CATHETER;  Surgeon: Larina Earthly, MD;  Location: [facility] OR;  Service: Vascular;  Laterality: N/A;   RIGHT/LEFT HEART CATH AND CORONARY ANGIOGRAPHY N/A 06/11/2023   Procedure: RIGHT/LEFT HEART CATH AND CORONARY ANGIOGRAPHY;  Surgeon: Yvonne Kendall, MD;  Location: [facility] INVASIVE CV LAB;  Service: Cardiovascular;  Laterality: N/A;    Social History:   reports that she quit smoking about 39 years ago. Her smoking use included cigarettes. She started smoking about 59 years ago. She has a 20 pack-year smoking history. She has never used smokeless tobacco. She reports that she does not drink alcohol and does not use drugs.   Family History:  Her family history includes Breast cancer in her sister; CAD in her father and sister; Cancer in her brother; Diabetes in her mother; Heart attack in her father; Heart disease in her father; Hypertension in her  father; Peripheral vascular disease in her mother.   Allergies Allergies  Allergen Reactions   Sulfa Antibiotics Other (See Comments)   Sulfa Drugs Cross Reactors Other (See Comments)    Doesn't remember     Home Medications  Prior to Admission medications   Medication Sig Start Date End Date Taking? Authorizing Provider  acetaminophen (TYLENOL) 650 MG CR tablet Take 1,300 mg by mouth every 8 (eight) hours as needed for pain.    [provider]  albuterol (VENTOLIN HFA) 108 (90 Base) MCG/ACT inhaler Inhale 1-2 puffs into the lungs every 6 (six) hours as needed for wheezing or shortness of breath. 12/15/21   Franne Forts, DO  apixaban (ELIQUIS) 5 MG TABS tablet Take 1 tablet (5 mg total) by mouth 2 (two) times daily. 10/02/22   Tresa Moore, MD  B Complex-C-Zn-Folic Acid (DIALYVITE/ZINC) TABS Take 1 tablet by mouth daily.    [provider]  bisacodyl (DULCOLAX) 5 MG EC tablet Take 2 tablets (10 mg total) by mouth daily as needed for moderate constipation. 10/02/22   Tresa Moore, MD  cinacalcet (SENSIPAR) 30 MG tablet Take 30 mg by mouth daily with supper.    [provider]  ezetimibe (ZETIA) 10 MG tablet Take 1 tablet (10 mg total) by mouth daily. 04/30/22   Iran Ouch, MD  ferric citrate (AURYXIA) 1 GM 210 MG(Fe) tablet Take 420 mg by mouth 3 (three) times daily with meals.    [provider]  fexofenadine (ALLEGRA) 180 MG tablet Take 180 mg by mouth daily.    [provider]  midodrine (PROAMATINE) 10 MG tablet Take 20 mg by mouth daily. (Take additional two tablets [20mg ] for administration at dialysis on Tuesday, Thursday and  Saturday)    [provider]  midodrine (PROAMATINE) 10 MG tablet Take 20 mg by mouth daily as needed (for SBP <100).    [provider]  Omega-3 Fatty Acids (FISH OIL) 1000 MG CPDR Take 1,000 mg by mouth daily.    [provider]  omeprazole (PRILOSEC) 20 MG capsule Take 1  capsule (20 mg total) by mouth daily. Patient taking differently: Take 20 mg by mouth 2 (two) times daily. 02/21/22   Arnette Felts, FNP  ondansetron (ZOFRAN-ODT) 4 MG disintegrating tablet Take 4 mg by mouth every 8 (eight) hours as needed for nausea or vomiting.    [provider]  polyethylene glycol (MIRALAX / GLYCOLAX) 17 g packet Take 17 g by mouth at bedtime.    [provider]  pregabalin (LYRICA) 100 MG capsule Take 100 mg by mouth at bedtime.    [provider]  pregabalin (LYRICA) 50 MG capsule Take 50 mg by mouth 2 (two) times daily.    [provider]  rosuvastatin (CRESTOR) 10 MG tablet Take 1 tablet (10 mg total) by mouth daily. 10/03/22   Tresa Moore, MD  sennosides-docusate sodium (SENOKOT-S) 8.6-50 MG tablet Take 1 tablet by mouth 2 (two) times daily.    [provider]  trolamine salicylate (ASPERCREME) 10 % cream Apply 1 Application topically 2 (two) times daily. (Apply to knees)    [provider]    Critical care time:   Critical care provider statement:   Total critical care time: 33 minutes   Performed by: Karna Christmas MD   Critical care time was exclusive of separately billable procedures and treating other patients.   Critical care was necessary to treat or prevent imminent or life-threatening deterioration.   Critical care was time spent personally by me on the following activities: development of treatment plan with patient and/or surrogate as well as nursing, discussions with consultants, evaluation of patient's response to treatment, examination of patient, obtaining history from patient or surrogate, ordering and performing treatments and interventions, ordering and review of laboratory studies, ordering and review of radiographic studies, pulse oximetry and re-evaluation of patient's condition.    Vida Rigger, M.D.  Pulmonary & Critical Care Medicine

## 2023-06-18 NOTE — Consult Note (Signed)
PHARMACY CONSULT NOTE  Pharmacy Consult for Electrolyte Monitoring and Replacement   Recent Labs: Potassium (mmol/L)  Date Value  06/18/2023 4.1   Magnesium (mg/dL)  Date Value  16/09/9603 1.9   Calcium (mg/dL)  Date Value  54/08/8118 7.9 (L)   Albumin (g/dL)  Date Value  14/78/2956 3.2 (L)  10/10/2021 4.1   Phosphorus (mg/dL)  Date Value  21/30/8657 3.6   Sodium (mmol/L)  Date Value  06/18/2023 131 (L)  10/10/2021 142    Assessment: 77 y.o female with significant PMH of HTN, T2DM,ESRD on HD TTS, Chronic debility , CAD s/p stent, PAF,  OSA, PAD, Left knee arthritis, Chronic lower back pain,GERD,Obesity , HLD  who presented to the ED  from Dialysis with chief complaints of generalized weakness x 4 days.   Goal of Therapy:  electrolytes within normal limits  Plan:  --no electrolyte replacement warranted for today --f/u with AM labs.    Bettey Costa, PharmD Clinical Pharmacist 06/18/2023 7:49 AM

## 2023-06-18 NOTE — Progress Notes (Signed)
ANTICOAGULATION CONSULT NOTE  Pharmacy Consult for IV heparin Indication: atrial fibrillation  Allergies  Allergen Reactions   Sulfa Antibiotics Other (See Comments)   Sulfa Drugs Cross Reactors Other (See Comments)    Doesn't remember     Patient Measurements: Height: 5\' 4"  (162.6 cm) Weight: 121.6 kg (268 lb 1.3 oz) IBW/kg (Calculated) : 54.7 Heparin Dosing Weight: 115.5 kg  Vital Signs: Temp: 98.6 F (37 C) (07/17 0400) Temp Source: Oral (07/17 0400) BP: 105/66 (07/17 0800) Pulse Rate: 58 (07/17 1000)  Labs: Recent Labs    06/16/23 0414 06/17/23 0508 06/18/23 0400 06/18/23 1345  HGB 9.7* 9.2* 9.1*  --   HCT 30.4* 29.1* 29.0*  --   PLT 153 145* 141*  --   HEPARINUNFRC 0.40 0.35 0.26* 0.34  CREATININE 5.64* 7.25* 5.00*  --     Estimated Creatinine Clearance: 11.7 mL/min (A) (by C-G formula based on SCr of 5 mg/dL (H)).   Medical History: Past Medical History:  Diagnosis Date   Arthritis    "left knee" (12/04/2016)   CAD (coronary artery disease)    a. 12/2016 NSTEMI/PCI: LM nl, LaD 20p/d, D1 20, OM2 20, OM3 20, RCA 47m (3.0x18 Resolute DES). EF 65%.   Chronic lower back pain    Diverticulosis    on CT   ESRD on dialysis [facility])    GERD (gastroesophageal reflux disease)    Hiatal hernia    History of echocardiogram    a. 09/2022 Echo: EF 60-65%, mild asymm LVH, mod reduced RV fxn, RVSP . Mildly dil RA. Mild AI. Ao sclerosis. Mild MR.   Hyperlipidemia    Hypertension    a. 12/2016 labetalol d/c'd 2/2 hypotension in HD.   Morbid obesity (HCC)    Obesity    Persistent atrial fibrillation (HCC)    a. 05/2023 Zio: 100% afib avg HR 52 (32-99).   Pneumonia    "couple times" (12/04/2016)   Type II diabetes mellitus (HCC)     Medications:  Eliquis prior to admission (last dose 7/7 @2100 )  Assessment: 77 year old female admitted with circulatory shock, of which has improved. Plan is for PPM, and thus holding Eliquis prior to procedure.  7/16 0508 HL 0.35    7/17 0400 HL 0.26.  7/17 1345 HL 0.34  Goal of Therapy:  anti-Xa 0.3-0.7 Monitor platelets by anticoagulation protocol: Yes    Plan:  - Heparin level is therapeutic x1 - Continue heparin infusion at 1350 units/hr - Check confirmatory heparin level in 8 hours - CBC with AM labs.   Bettey Costa, PharmD Clinical Pharmacist 06/18/2023 3:40 PM

## 2023-06-18 NOTE — Progress Notes (Signed)
Patient Name: Jacqueline [name] Date of Encounter: 06/18/2023 [facility] Cardiologist: Lorine Bears, MD   Interval Summary  .    No chest pain, dyspnea, palpitations, dizziness, recent appendectomy, or syncope.  Tolerated 3.5 hours of hemodialysis yesterday, off norepinephrine drip with stable BP.  Bradycardic during hemodialysis, though asymptomatic.  Remains on fludrocortisone, midodrine, and droxidopa.    Vital Signs .    Vitals:   06/18/23 0300 06/18/23 0400 06/18/23 0425 06/18/23 0500  BP:    (!) 91/38  Pulse: (!) 51 (!) 51  (!) 50  Resp: 17 16  16   Temp:  98.6 F (37 C)    TempSrc:  Oral    SpO2: 92% 92%  93%  Weight:   121.6 kg   Height:        Intake/Output Summary (Last 24 hours) at 06/18/2023 0955 Last data filed at 06/18/2023 0500 Gross per 24 hour  Intake 235 ml  Output 1500 ml  Net -1265 ml      06/18/2023    4:25 AM 06/17/2023    5:00 AM 06/16/2023    5:00 AM  Last 3 Weights  Weight (lbs) 268 lb 1.3 oz 266 lb 1.5 oz 264 lb 15.9 oz  Weight (kg) 121.6 kg 120.7 kg 120.2 kg      Telemetry/ECG    Afib with ventricular rates ranging from the 50s to the 60s - Personally Reviewed  Physical Exam .    VS:  BP (!) 91/38   Pulse (!) 50   Temp 98.6 F (37 C) (Oral)   Resp 16   Ht 5\' 4"  (1.626 m)   Wt 121.6 kg   SpO2 93%   BMI 46.02 kg/m  , BMI Body mass index is 46.02 kg/m. GENERAL: Chronically ill-appearing.  No acute distress. HEENT: Pupils equal round and reactive, fundi not visualized, oral mucosa unremarkable NECK:  No jugular venous distention, waveform within normal limits, carotid upstroke brisk and symmetric, no bruits, no thyromegaly LUNGS:  Clear to auscultation bilaterally HEART: Bradycardic.  IRIR.  PMI not displaced or sustained,S1 and S2 within normal limits, no S3, no S4, no clicks, no rubs, no murmurs ABD:  Flat, positive bowel sounds normal in frequency in pitch, no bruits, no rebound, no guarding, no midline pulsatile mass,  no hepatomegaly, no splenomegaly EXT:  2 plus pulses throughout, no edema, no cyanosis no clubbing SKIN:  No rashes no nodules NEURO:  Cranial nerves II through XII grossly intact, motor grossly intact throughout PSYCH:  Cognitively intact, oriented to person place and time   Assessment & Plan .     # Bradycardia: # Hypotension: Bradycardia does not seem to be causing hypotension and thus there is no benefit from a pacemaker.   She is asymptomatic.  She is on midodrine, fludrocortisone and droxidopa.  BP stable off norepinephrine with hemodialysis.  If she is unable to safely undergo dialysis due to symptomatic hypotension, reconsideration of hospice will need to be pursued.  Given her low diastolic BP there was concern for low SVR secondary to fistula and shunting. However, on ultrasound 06/13/23, flow was found to be in acceptable range.   # CAD s/p RCA PCI: # Hyperlipidemia: Cath this admission showed patent RCA stent and mild-mod non-obstructive CAD.  Severely elevated R heart and pulmonary pressures.  Continue rosuvastatin and Zetia.  # Severe pulmonary HTN: LV function is normal and RV moderately reduced on echo.  RHC demonstrated severely elevated pulmonary pressure as below.  Pulmonary  pressure did not exceed systemic pressure.  Volume management challenging given her hypotension.  Volume is managed by nephrology and dialysis.  We did discuss with advanced heart failure to see if she is a candidate for pulmonary hypertension treatment but given that she is on dialysis, she is not a candidate.  # ESRD on HD:  Per nephrology.  # Persistent atrial fibrillation:  Bradycardic as above.  Therefore not on any nodal agents.  She remains on IV heparin.  She can be switched back to Eliquis from our standpoint.  # Mod-severe TR:  Not a good candidate for repair given RV dysfuction and pulmonary HTN.  # OSA:  Patient denies diagnosis of OSA and reports that she does not use a CPAP at home.   Consider outpatient sleep study.    For questions or updates, please contact [facility] Please consult [email or web address] for contact info under     Signed, Eula Listen, PA-C

## 2023-06-18 NOTE — Plan of Care (Signed)

## 2023-06-18 NOTE — Plan of Care (Signed)
  Problem: Education: Goal: Knowledge of General Education information will improve Description: Including pain rating scale, medication(s)/side effects and non-pharmacologic comfort measures Outcome: Progressing   Problem: Clinical Measurements: Goal: Will remain free from infection Outcome: Progressing Goal: Diagnostic test results will improve Outcome: Progressing Goal: Respiratory complications will improve Outcome: Progressing Goal: Cardiovascular complication will be avoided Outcome: Progressing   Problem: Nutrition: Goal: Adequate nutrition will be maintained Outcome: Progressing   Problem: Coping: Goal: Level of anxiety will decrease Outcome: Progressing   Problem: Elimination: Goal: Will not experience complications related to bowel motility Outcome: Progressing   Problem: Clinical Measurements: Goal: Will remain free from infection Outcome: Progressing

## 2023-06-18 NOTE — Progress Notes (Signed)
[facility]  ROUNDING NOTE   Subjective:   Hemodialysis treatment yesterday. UF of 1.5 liters.   Off vasopressors.   Objective:  Vital signs in last 24 hours:  Temp:  [97.8 F (36.6 C)-98.6 F (37 C)] 98.6 F (37 C) (07/17 0400) Pulse Rate:  [44-64] 58 (07/17 1000) Resp:  [13-23] 19 (07/17 1000) BP: (81-115)/(37-66) 105/66 (07/17 0800) SpO2:  [87 %-100 %] 98 % (07/17 1000) Arterial Line BP: (96-139)/(27-36) 100/36 (07/17 1000) Weight:  [121.6 kg] 121.6 kg (07/17 0425)  Weight change: 0.9 kg Filed Weights   06/16/23 0500 06/17/23 0500 06/18/23 0425  Weight: 120.2 kg 120.7 kg 121.6 kg    Intake/Output: I/O last 3 completed shifts: In: 458.5 [I.V.:458.5] Out: 1500 [Other:1500]   Intake/Output this shift:  No intake/output data recorded.  Physical Exam: General: NAD, laying in bed  Head: Normocephalic, atraumatic. Moist oral mucosal membranes  Eyes: Anicteric  Lungs:  Clear to auscultation, normal effort  Heart: Regular rate and rhythm  Abdomen:  Soft, nontender  Extremities: no peripheral edema.  Neurologic: Alert and oriented, moving all four extremities  Skin: No lesions  Access: Lt AVF    Basic Metabolic Panel: Recent Labs  Lab 06/12/23 0211 06/13/23 0604 06/14/23 0405 06/15/23 0438 06/16/23 0414 06/17/23 0508 06/18/23 0400  NA 131* 132* 131* 132* 130* 129* 131*  K 3.3* 4.1 4.6 3.8 4.5 4.6 4.1  CL 92* 94* 94* 96* 94* 93* 96*  CO2 27 27 26 27 27 24 23   GLUCOSE 202* 243* 167* 206* 132* 136* 112*  BUN 29* 33* 56* 38* 57* 78* 51*  CREATININE 4.80* 4.60* 5.56* 4.10* 5.64* 7.25* 5.00*  CALCIUM 7.6* 7.7* 7.8* 7.8* 7.8* 7.8* 7.9*  MG 2.0 2.2  --  1.8 1.8  --  1.9  PHOS 2.6 1.9* 2.2* 2.2* 3.9 4.6 3.6    Liver Function Tests: Recent Labs  Lab 06/14/23 0405 06/15/23 0438 06/16/23 0414 06/17/23 0508 06/18/23 0400  ALBUMIN 2.9* 2.9* 3.1* 2.9* 3.2*   No results for input(s): "LIPASE", "AMYLASE" in the last 168 hours.  No results for  input(s): "AMMONIA" in the last 168 hours.  CBC: Recent Labs  Lab 06/14/23 0753 06/15/23 0438 06/16/23 0414 06/17/23 0508 06/18/23 0400  WBC 12.9* 12.5* 12.0* 10.8* 9.5  HGB 10.1* 10.0* 9.7* 9.2* 9.1*  HCT 31.9* 32.2* 30.4* 29.1* 29.0*  MCV 98.5 99.1 99.0 97.0 100.3*  PLT 140* 148* 153 145* 141*    Cardiac Enzymes: No results for input(s): "CKTOTAL", "CKMB", "CKMBINDEX", "TROPONINI" in the last 168 hours.  BNP: Invalid input(s): "POCBNP"  CBG: Recent Labs  Lab 06/17/23 1119 06/17/23 1929 06/17/23 2327 06/18/23 0350 06/18/23 0732  GLUCAP 160* 129* 158* 105* 119*    Microbiology: Results for orders placed or performed during the hospital encounter of 06/07/23  Blood culture (routine x 2)     Status: None   Collection Time: 06/07/23 12:54 PM   Specimen: BLOOD  Result Value Ref Range Status   Specimen Description BLOOD RIGHT ANTECUBITAL  Final   Special Requests   Final    BOTTLES DRAWN AEROBIC AND ANAEROBIC Blood Culture adequate volume   Culture   Final    NO GROWTH 5 DAYS Performed at [facility], [address], [place], [place] [address]    Report Status 06/12/2023 FINAL  Final  Blood culture (routine x 2)     Status: None   Collection Time: 06/07/23 12:54 PM   Specimen: BLOOD  Result Value Ref Range  Status   Specimen Description BLOOD BLOOD RIGHT FOREARM  Final   Special Requests   Final    BOTTLES DRAWN AEROBIC AND ANAEROBIC Blood Culture adequate volume   Culture   Final    NO GROWTH 5 DAYS Performed at [facility], [address], [place], [place] [address]    Report Status 06/12/2023 FINAL  Final  MRSA Next Gen by PCR, Nasal     Status: None   Collection Time: 06/07/23 10:18 PM   Specimen: Nasal Mucosa; Nasal Swab  Result Value Ref Range Status   MRSA by PCR Next Gen NOT DETECTED NOT DETECTED Final    Comment: (NOTE) The GeneXpert MRSA Assay (FDA approved for NASAL specimens only), is one component of a comprehensive MRSA  colonization surveillance program. It is not intended to diagnose MRSA infection nor to guide or monitor treatment for MRSA infections. Test performance is not FDA approved in patients less than 84 years old. Performed at [facility], [address] [place], [place] [address]     Coagulation Studies: No results for input(s): "LABPROT", "INR" in the last 72 hours.  Urinalysis: No results for input(s): "COLORURINE", "LABSPEC", "PHURINE", "GLUCOSEU", "HGBUR", "BILIRUBINUR", "KETONESUR", "PROTEINUR", "UROBILINOGEN", "NITRITE", "LEUKOCYTESUR" in the last 72 hours.  Invalid input(s): "APPERANCEUR"    Imaging: No results found.   Medications:    sodium chloride Stopped (06/10/23 1900)   sodium chloride     anticoagulant sodium citrate     heparin 1,350 Units/hr (06/18/23 0603)   norepinephrine (LEVOPHED) Adult infusion Stopped (06/17/23 0245)    Chlorhexidine Gluconate Cloth  6 each Topical QHS   droxidopa  100 mg Oral TID WC   ezetimibe  10 mg Oral Daily   feeding supplement (NEPRO CARB STEADY)  237 mL Oral TID BM   fludrocortisone  0.2 mg Oral BID   insulin aspart  0-20 Units Subcutaneous TID WC   insulin aspart  0-5 Units Subcutaneous QHS   insulin glargine-yfgn  5 Units Subcutaneous Daily   loratadine  10 mg Oral Daily   midodrine  10 mg Oral TID WC   multivitamin  1 tablet Oral QHS   pregabalin  100 mg Oral QHS   pregabalin  50 mg Oral BID   rosuvastatin  10 mg Oral Daily   senna-docusate  1 tablet Oral BID   sodium chloride flush  3 mL Intravenous Q12H   sodium chloride, acetaminophen, alteplase, alum & mag hydroxide-simeth, anticoagulant sodium citrate, bisacodyl, docusate sodium, guaiFENesin-dextromethorphan, heparin, HYDROcodone-acetaminophen, lidocaine (PF), lidocaine-prilocaine, morphine injection, nitroGLYCERIN, ondansetron (ZOFRAN) IV, mouth rinse, pentafluoroprop-tetrafluoroeth, polyethylene glycol, sodium chloride flush  Assessment/ Plan:  Ms. HERMIONE HAVLICEK is a 77 y.o.  female  with a PMHx of ESRD on HD TTS at [facility], diabetes mellitus type 2, chronic hypotension, peripheral arterial disease, obstructive sleep apnea, GERD, obesity, hyperlipidemia, anemia chronic kidney disease, secondary hyperparathyroidism, who was admitted to [facility] on 06/07/2023 for evaluation of generalized weakness.   1  ESRD: on hemodialysis  - Continue  TTS schedule.  - Volume status seems optimized today.   2.  Hypotension with severe pulmonary hypertension..  Chronic in nature.  Systolic blood pressure usually in the 80s as an outpatient.   - midodrine 10 mg 3 times daily as outpatient.   - fludrocortisone - droxidopa  - vasopressors: weaned off  3. Anemia of chronic kidney disease Lab Results  Component Value Date   HGB 9.1 (L) 06/18/2023  ESA with next HD treatment  Continue Mircera as an outpatient.  4. Diabetes mellitus type II with chronic kidney disease/renal manifestations: noninsulin dependent.  Hemoglobin A1c is 5.8 on 06/07/23.   - continue glucose control.   5. Secondary Hyperparathyroidism:    Lab Results  Component Value Date   PTH 388.6 (H) 01/17/2014   CALCIUM 7.9 (L) 06/18/2023   CAION 0.92 (L) 06/11/2023   PHOS 3.6 06/18/2023   - Holding Auryxia and Sensipar     LOS: 11 Vontrell Pullman 7/17/202410:56 AM

## 2023-06-18 NOTE — Evaluation (Signed)
Physical Therapy Evaluation Patient Details Name: Jacqueline [name] MRN: [id number] DOB: 16-Jun-1946 Today's Date: 06/18/2023  History of Present Illness  77 y.o female with significant PMHx significant for HTN, T2DM, ESRD on HD TTS, Chronic debility, CAD s/p stent, PAF, OSA, PAD, Left knee arthritis, Chronic lower back pain, GERD, Obesity, HLD who presented to the ED from Dialysis with generalized weakness x 4 days.   Clinical Impression  Patient received in bed, she is alert, agreeable to PT/OT assessment. Patient requires cues to initiate mobility and min A for supine to sit with heavy use of bed rails. Patient is able to stand with min A +2 and took a few pivoting steps to recliner with RW and +2 mod A. She had a poorly controlled descent into the recliner. Patient will continue to benefit from skilled PT to improve strength and safety with mobility.         Assistance Recommended at Discharge Intermittent Supervision/Assistance  If plan is discharge home, recommend the following:  Can travel by private vehicle  Two people to help with walking and/or transfers;Two people to help with bathing/dressing/bathroom        Equipment Recommendations None recommended by PT  Recommendations for Other Services       Functional Status Assessment Patient has had a recent decline in their functional status and demonstrates the ability to make significant improvements in function in a reasonable and predictable amount of time.     Precautions / Restrictions Precautions Precautions: Fall Restrictions Weight Bearing Restrictions: No      Mobility  Bed Mobility Overal bed mobility: Needs Assistance Bed Mobility: Supine to Sit     Supine to sit: HOB elevated, +2 for safety/equipment, Min assist     General bed mobility comments: hand held assist, heavy use of bed rails    Transfers Overall transfer level: Needs assistance Equipment used: Rolling walker (2 wheels) Transfers: Sit  to/from Stand Sit to Stand: Min assist, +2 physical assistance, +2 safety/equipment, From elevated surface   Step pivot transfers: Min assist, +2 physical assistance, +2 safety/equipment       General transfer comment: Pt transfered with MIN A +2 with RW in place. Pt displayed difficult time controling decent to recliner. Able to take a few steps.    Ambulation/Gait                  Stairs            Wheelchair Mobility     Tilt Bed    Modified Rankin (Stroke Patients Only)       Balance Overall balance assessment: Needs assistance Sitting-balance support: Feet supported Sitting balance-Leahy Scale: Fair     Standing balance support: Bilateral upper extremity supported, During functional activity, Reliant on assistive device for balance Standing balance-Leahy Scale: Poor Standing balance comment: poor control of descent into recliner                             Pertinent Vitals/Pain Pain Assessment Pain Assessment: No/denies pain    Home Living Family/patient expects to be discharged to:: Skilled nursing facility                   Additional Comments: Patient is from [facility]    Prior Function Prior Level of Function : Needs assist       Physical Assist : Mobility (physical);ADLs (physical)     Mobility Comments: Pt reports walking with  a RW in room distances, w/c use for all other mobility ( to and from HD) ADLs Comments: Pt reports assistance with all ADLs at SNF     Hand Dominance        Extremity/Trunk Assessment   Upper Extremity Assessment Upper Extremity Assessment: Defer to OT evaluation    Lower Extremity Assessment Lower Extremity Assessment: Generalized weakness    Cervical / Trunk Assessment Cervical / Trunk Assessment: Normal  Communication   Communication: No difficulties  Cognition Arousal/Alertness: Awake/alert Behavior During Therapy: WFL for tasks assessed/performed Overall Cognitive  Status: Within Functional Limits for tasks assessed                                 General Comments: Pt able to converse and follow instructions without concern        General Comments      Exercises     Assessment/Plan    PT Assessment Patient needs continued PT services  PT Problem List Decreased strength;Decreased activity tolerance;Decreased balance;Decreased mobility;Cardiopulmonary status limiting activity;Obesity       PT Treatment Interventions Gait training;DME instruction;Therapeutic exercise;Functional mobility training;Therapeutic activities;Patient/family education;Balance training    PT Goals (Current goals can be found in the Care Plan section)  Acute Rehab PT Goals Patient Stated Goal: to return to SNF PT Goal Formulation: With patient Time For Goal Achievement: 07/02/23 Potential to Achieve Goals: Fair    Frequency Min 1X/week     Co-evaluation PT/OT/SLP Co-Evaluation/Treatment: Yes Reason for Co-Treatment: For patient/therapist safety;To address functional/ADL transfers PT goals addressed during session: Mobility/safety with mobility;Balance         AM-PAC PT "6 Clicks" Mobility  Outcome Measure Help needed turning from your back to your side while in a flat bed without using bedrails?: A Lot Help needed moving from lying on your back to sitting on the side of a flat bed without using bedrails?: A Lot Help needed moving to and from a bed to a chair (including a wheelchair)?: A Lot Help needed standing up from a chair using your arms (e.g., wheelchair or bedside chair)?: A Lot Help needed to walk in hospital room?: Total Help needed climbing 3-5 steps with a railing? : Total 6 Click Score: 10    End of Session Equipment Utilized During Treatment: Gait belt Activity Tolerance: Patient limited by fatigue Patient left: in chair;with call bell/phone within reach Nurse Communication: Mobility status PT Visit Diagnosis: Other  abnormalities of gait and mobility (R26.89);Muscle weakness (generalized) (M62.81);Difficulty in walking, not elsewhere classified (R26.2);Unsteadiness on feet (R26.81)    Time: 2956-2130 PT Time Calculation (min) (ACUTE ONLY): 21 min   Charges:   PT Evaluation $PT Eval Moderate Complexity: 1 Mod   PT General Charges $$ ACUTE PT VISIT: 1 Visit         Latiesha Harada, PT, GCS 06/18/23,1:09 PM

## 2023-06-18 NOTE — Progress Notes (Signed)
Daily Progress Note   Patient Name: Jacqueline [name]       Date: 06/18/2023 DOB: January 21, 1946  Age: 77 y.o. MRN#: [id number] Attending Physician: Vida Rigger, MD Primary Care Physician: Macky Lower, MD Admit Date: 06/07/2023  Reason for Consultation/Follow-up: Establishing goals of care  Subjective: Notes and labs reviewed.  Patient states she is happy that she has been able to tolerate dialysis.  She states as long as she is able to tolerate dialysis, she wishes to continue with treatments.  She states after giving it thought, she wishes to continue any and all life-prolonging care; full code and full scope.   She questions D/C to [facility] or [facility].  Discussed with TOC.  Length of Stay: 11  Current Medications: Scheduled Meds:   Chlorhexidine Gluconate Cloth  6 each Topical QHS   droxidopa  100 mg Oral TID WC   ezetimibe  10 mg Oral Daily   feeding supplement (NEPRO CARB STEADY)  237 mL Oral TID BM   fludrocortisone  0.2 mg Oral BID   insulin aspart  0-20 Units Subcutaneous TID WC   insulin aspart  0-5 Units Subcutaneous QHS   insulin glargine-yfgn  5 Units Subcutaneous Daily   loratadine  10 mg Oral Daily   midodrine  10 mg Oral TID WC   multivitamin  1 tablet Oral QHS   pregabalin  100 mg Oral QHS   pregabalin  50 mg Oral BID   rosuvastatin  10 mg Oral Daily   senna-docusate  1 tablet Oral BID   sodium chloride flush  3 mL Intravenous Q12H    Continuous Infusions:  sodium chloride Stopped (06/10/23 1900)   sodium chloride     anticoagulant sodium citrate     heparin 1,350 Units/hr (06/18/23 0603)   norepinephrine (LEVOPHED) Adult infusion Stopped (06/17/23 0245)    PRN Meds: sodium chloride, acetaminophen, alteplase, alum & mag hydroxide-simeth,  anticoagulant sodium citrate, bisacodyl, docusate sodium, guaiFENesin-dextromethorphan, heparin, HYDROcodone-acetaminophen, lidocaine (PF), lidocaine-prilocaine, morphine injection, nitroGLYCERIN, ondansetron (ZOFRAN) IV, mouth rinse, pentafluoroprop-tetrafluoroeth, polyethylene glycol, sodium chloride flush  Physical Exam Pulmonary:     Effort: Pulmonary effort is normal.  Neurological:     Mental Status: She is alert.             Vital Signs: BP 105/66   Pulse Marland Kitchen)  58   Temp 98.6 F (37 C) (Oral)   Resp 19   Ht 5\' 4"  (1.626 m)   Wt 121.6 kg   SpO2 98%   BMI 46.02 kg/m  SpO2: SpO2: 98 % O2 Device: O2 Device: Nasal Cannula (Simultaneous filing. User may not have seen previous data.) O2 Flow Rate: O2 Flow Rate (L/min): 5 L/min (Simultaneous filing. User may not have seen previous data.)  Intake/output summary:  Intake/Output Summary (Last 24 hours) at 06/18/2023 1149 Last data filed at 06/18/2023 0500 Gross per 24 hour  Intake 212.74 ml  Output 1500 ml  Net -1287.26 ml   LBM: Last BM Date : 06/13/23 Baseline Weight: Weight: 114.8 kg Most recent weight: Weight: 121.6 kg     Patient Active Problem List   Diagnosis Date Noted   Goals of care, counseling/discussion 06/12/2023   Severe pulmonary hypertension (HCC) 06/10/2023   Morbid obesity (HCC) 06/08/2023   Symptomatic bradycardia 06/07/2023   Hyperkalemia 09/30/2022   Constipation 09/30/2022   AMS (altered mental status) 09/28/2022   Hypotension    Permanent atrial fibrillation (HCC)    Pressure injury of skin 05/22/2022   General weakness 05/22/2022   Weakness 05/21/2022   Anaphylactic shock, unspecified, initial encounter 06/29/2020   Sepsis (HCC) 06/22/2019   Pain due to onychomycosis of toenails of both feet 06/11/2019   Callus 06/11/2019   Other disorders of phosphorus metabolism 05/20/2019   Mixed hyperlipidemia 03/22/2019   Otalgia of both ears 03/22/2019   Right arm pain 03/22/2019   Bradycardia  10/01/2017   Dyslipidemia 10/01/2017   Dependence on renal dialysis (HCC) 09/01/2017   Chest pain, unspecified 12/06/2016   Non-ST elevation (NSTEMI) myocardial infarction (HCC) 12/06/2016   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   History of NSTEMI    CAD (coronary artery disease) 12/03/2016   Hypokalemia 05/03/2016   Pruritus, unspecified 03/27/2015   Encounter for removal of sutures 11/30/2014   Pain in lower limb 08/26/2014   Encounter for immunization 06/10/2014   Unspecified protein-calorie malnutrition (HCC) 02/28/2014   Allergy, unspecified, initial encounter 02/15/2014   HCAP (healthcare-associated pneumonia) 02/13/2014   Absence of both cervix and uterus, acquired 02/01/2014   Coagulation defect, unspecified (HCC) 02/01/2014   Dyspnea, unspecified 02/01/2014   Family history of diabetes mellitus 02/01/2014   Family history of ischemic heart disease and other diseases of the circulatory system 02/01/2014   Fever, unspecified 02/01/2014   Headache, unspecified 02/01/2014   History of allergy to sulfonamides 02/01/2014   Iron deficiency anemia, unspecified 02/01/2014   Long term (current) use of insulin (HCC) 02/01/2014   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   OSA (obstructive sleep apnea) 01/21/2014   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HCC) 01/21/2014   Acute encephalopathy 01/19/2014   Congestive heart failure (HCC) 01/15/2014   End stage renal disease (HCC) 01/15/2014   Hypertension 01/15/2014   Diabetes mellitus (HCC) 01/15/2014   Anemia 01/15/2014   Onychomycosis 03/26/2013   Pain in joint, ankle and foot 03/26/2013    Palliative Care Assessment & Plan     Recommendations/Plan: Full code/full scope.  Patient states that she wishes to continue dialysis as long as she is able to tolerate treatments.  Code Status:    Code Status Orders  (From admission, onward)           Start     Ordered   06/11/23 1358  Full code  Continuous        Question:  By:  Answer:  Consent: discussion documented in EHR  06/11/23 1358           Code Status History     Date Active Date Inactive Code Status Order ID Comments User Context   06/07/2023 1926 06/11/2023 1358 Full Code [id number]  Jimmye Norman, NP ED   09/28/2022 1410 10/02/2022 1618 Full Code [id number]  Lucile Shutters, MD ED   05/22/2022 0052 05/30/2022 2329 Full Code [id number]  Lurline Del, MD ED   06/22/2019 1441 06/24/2019 1328 Full Code [id number]  Carron Curie, MD Inpatient   12/03/2016 1641 12/05/2016 1631 Full Code [id number]  Calvert Cantor, MD ED   01/15/2014 2031 01/28/2014 2059 Full Code [id number]  Ron Parker, MD ED   01/07/2012 0100 01/07/2012 1448 Full Code [id number]  Doug Sou, MD ED      Advance Directive Documentation    Flowsheet Row Most Recent Value  Type of Advance Directive Healthcare Power of Attorney  Pre-existing out of facility DNR order (yellow form or pink MOST form) --  "MOST" Form in Place? --      Care plan was discussed with [facility] Thank you for allowing the Palliative Medicine Team to assist in the care of this patient.  Morton Stall, NP  Please contact Palliative Medicine Team phone at [phone] for questions and concerns.

## 2023-06-18 NOTE — Progress Notes (Signed)
ANTICOAGULATION CONSULT NOTE  Pharmacy Consult for IV heparin Indication: atrial fibrillation  Allergies  Allergen Reactions   Sulfa Antibiotics Other (See Comments)   Sulfa Drugs Cross Reactors Other (See Comments)    Doesn't remember     Patient Measurements: Height: 5\' 4"  (162.6 cm) Weight: 121.6 kg (268 lb 1.3 oz) IBW/kg (Calculated) : 54.7 Heparin Dosing Weight: 115.5 kg  Vital Signs: Temp: 98.3 F (36.8 C) (07/16 2000) Temp Source: Oral (07/16 2000) BP: 109/44 (07/16 1758) Pulse Rate: 47 (07/16 2300)  Labs: Recent Labs    06/16/23 0414 06/17/23 0508 06/18/23 0400  HGB 9.7* 9.2* 9.1*  HCT 30.4* 29.1* 29.0*  PLT 153 145* 141*  HEPARINUNFRC 0.40 0.35 0.26*  CREATININE 5.64* 7.25* 5.00*    Estimated Creatinine Clearance: 11.7 mL/min (A) (by C-G formula based on SCr of 5 mg/dL (H)).   Medical History: Past Medical History:  Diagnosis Date   Arthritis    "left knee" (12/04/2016)   CAD (coronary artery disease)    a. 12/2016 NSTEMI/PCI: LM nl, LaD 20p/d, D1 20, OM2 20, OM3 20, RCA 97m (3.0x18 Resolute DES). EF 65%.   Chronic lower back pain    Diverticulosis    on CT   ESRD on dialysis [facility])    GERD (gastroesophageal reflux disease)    Hiatal hernia    History of echocardiogram    a. 09/2022 Echo: EF 60-65%, mild asymm LVH, mod reduced RV fxn, RVSP . Mildly dil RA. Mild AI. Ao sclerosis. Mild MR.   Hyperlipidemia    Hypertension    a. 12/2016 labetalol d/c'd 2/2 hypotension in HD.   Morbid obesity (HCC)    Obesity    Persistent atrial fibrillation (HCC)    a. 05/2023 Zio: 100% afib avg HR 52 (32-99).   Pneumonia    "couple times" (12/04/2016)   Type II diabetes mellitus (HCC)     Medications:  Eliquis prior to admission (last dose 7/7 @2100 )  Assessment: 77 year old female admitted with circulatory shock, of which has improved. Plan is for PPM, and thus holding Eliquis prior to procedure.  7/16 0508 HL 0.35   7/17 0400 HL 0.26.   Goal of  Therapy:  anti-Xa 0.3-0.7 Monitor platelets by anticoagulation protocol: Yes    Plan:  Heparin level is subtherapeutic. Will increase heparin infusion to 1350 units/hr. Recheck heparin level in 8 hours and CBC with AM labs.   Paschal Dopp, PharmD, BCPS 06/18/2023 4:51 AM

## 2023-06-18 NOTE — Progress Notes (Signed)
ANTICOAGULATION CONSULT NOTE  Pharmacy Consult for IV heparin Indication: atrial fibrillation  Allergies  Allergen Reactions   Sulfa Antibiotics Other (See Comments)   Sulfa Drugs Cross Reactors Other (See Comments)    Doesn't remember     Patient Measurements: Height: 5\' 4"  (162.6 cm) Weight: 121.6 kg (268 lb 1.3 oz) IBW/kg (Calculated) : 54.7 Heparin Dosing Weight: 115.5 kg  Vital Signs: Temp: 98.6 F (37 C) (07/17 2000) Temp Source: Oral (07/17 2000) BP: 94/45 (07/17 2000) Pulse Rate: 58 (07/17 2000)  Labs: Recent Labs    06/16/23 0414 06/17/23 0508 06/18/23 0400 06/18/23 1345 06/18/23 2148  HGB 9.7* 9.2* 9.1*  --   --   HCT 30.4* 29.1* 29.0*  --   --   PLT 153 145* 141*  --   --   HEPARINUNFRC 0.40 0.35 0.26* 0.34 0.35  CREATININE 5.64* 7.25* 5.00*  --   --     Estimated Creatinine Clearance: 11.7 mL/min (A) (by C-G formula based on SCr of 5 mg/dL (H)).   Medical History: Past Medical History:  Diagnosis Date   Arthritis    "left knee" (12/04/2016)   CAD (coronary artery disease)    a. 12/2016 NSTEMI/PCI: LM nl, LaD 20p/d, D1 20, OM2 20, OM3 20, RCA 70m (3.0x18 Resolute DES). EF 65%.   Chronic lower back pain    Diverticulosis    on CT   ESRD on dialysis [facility])    GERD (gastroesophageal reflux disease)    Hiatal hernia    History of echocardiogram    a. 09/2022 Echo: EF 60-65%, mild asymm LVH, mod reduced RV fxn, RVSP . Mildly dil RA. Mild AI. Ao sclerosis. Mild MR.   Hyperlipidemia    Hypertension    a. 12/2016 labetalol d/c'd 2/2 hypotension in HD.   Morbid obesity (HCC)    Obesity    Persistent atrial fibrillation (HCC)    a. 05/2023 Zio: 100% afib avg HR 52 (32-99).   Pneumonia    "couple times" (12/04/2016)   Type II diabetes mellitus (HCC)     Medications:  Eliquis prior to admission (last dose 7/7 @2100 )  Assessment: 77 year old female admitted with circulatory shock, of which has improved. Plan is for PPM, and thus holding Eliquis  prior to procedure.  7/16 0508 HL 0.35   7/17 0400 HL 0.26.  7/17 1345 HL 0.34 7/17 2148 HL 0.35.   Goal of Therapy:  anti-Xa 0.3-0.7 Monitor platelets by anticoagulation protocol: Yes    Plan:  Heparin level is therapeutic. Will continue heparin infusion at 1350 units/hr. Recheck heparin level and CBC 7/19 AM, unless infusion is interrupted.   Ronnald Ramp, PharmD, BCPS Clinical Pharmacist 06/18/2023 10:30 PM

## 2023-06-18 NOTE — Evaluation (Signed)
Occupational Therapy Evaluation Patient Details Name: Jacqueline [name] MRN: [id number] DOB: [birth year] Today's Date: 06/18/2023   History of Present Illness 77 y.o female with significant PMHx significant for HTN, T2DM, ESRD on HD TTS, Chronic debility, CAD s/p stent, PAF, OSA, PAD, Left knee arthritis, Chronic lower back pain, GERD, Obesity, HLD who presented to the ED from Dialysis with generalized weakness x 4 days.   Clinical Impression   Pt was seen for OT/PT evaluation this date. Prior to hospital admission, pt was residing at a SNF prior to admission. Pt reports assistance was provided to complete ADLs, with assist ranging from supervision - MAX A. Pt presents to acute OT demonstrating impaired ADL performance and functional mobility 2/2 decreased activity tolerance and balance (See OT problem list for additional functional deficits). Pt currently requires MIN A for bed mobility with HOB elevated. MIN A +2 with RW for transfer to recliner. Pt's vitals were as followed HR 78, BP 106/66 (79), SpO2 96 on 5L [place]. Pt reported dizziness upon sitting upright, but resolved within a minute while sitting EOB. Pt would benefit from skilled OT services to address noted impairments and functional limitations (see below for any additional details) in order to maximize safety and independence while minimizing falls risk and caregiver burden. Anticipate the need for follow up OT services upon acute hospital DC.       Recommendations for follow up therapy are one component of a multi-disciplinary discharge planning process, led by the attending physician.  Recommendations may be updated based on patient status, additional functional criteria and insurance authorization.   Assistance Recommended at Discharge Intermittent Supervision/Assistance  Patient can return home with the following A little help with walking and/or transfers;A lot of help with bathing/dressing/bathroom;Assist for transportation     Functional Status Assessment  Patient has had a recent decline in their functional status and demonstrates the ability to make significant improvements in function in a reasonable and predictable amount of time.  Equipment Recommendations  Other (comment) (Defer to next venue of care)    Recommendations for Other Services       Precautions / Restrictions Precautions Precautions: Fall Restrictions Weight Bearing Restrictions: No      Mobility Bed Mobility Overal bed mobility: Needs Assistance Bed Mobility: Supine to Sit     Supine to sit: Min assist, HOB elevated     General bed mobility comments: hand held assist    Transfers Overall transfer level: Needs assistance Equipment used: Rolling walker (2 wheels) Transfers: Bed to chair/wheelchair/BSC       Step pivot transfers: Min assist, +2 physical assistance     General transfer comment: Pt transfered with MIN A +2 with RW in place. Pt displayed difficult time controling decent to recliner.      Balance Overall balance assessment: Needs assistance Sitting-balance support: Bilateral upper extremity supported, Feet supported Sitting balance-Leahy Scale: Fair     Standing balance support: Bilateral upper extremity supported, Reliant on assistive device for balance Standing balance-Leahy Scale: Poor                             ADL either performed or assessed with clinical judgement   ADL Overall ADL's : Needs assistance/impaired Eating/Feeding: Set up;Sitting Eating/Feeding Details (indicate cue type and reason): Simulated                     Toilet Transfer: Minimal assistance;+2 for physical assistance;Stand-pivot Toilet Transfer Details (  indicate cue type and reason): Wobbly during first attempt, better positioned during the second attempt with less assist from therapist         Functional mobility during ADLs: Minimal assistance;+2 for physical assistance;Rolling walker (2 wheels)        Vision         Perception     Praxis      Pertinent Vitals/Pain Pain Assessment Pain Assessment: No/denies pain     Hand Dominance     Extremity/Trunk Assessment Upper Extremity Assessment Upper Extremity Assessment: Defer to OT evaluation   Lower Extremity Assessment Lower Extremity Assessment: Generalized weakness   Cervical / Trunk Assessment Cervical / Trunk Assessment: Normal   Communication Communication Communication: No difficulties   Cognition Arousal/Alertness: Awake/alert Behavior During Therapy: WFL for tasks assessed/performed Overall Cognitive Status: Within Functional Limits for tasks assessed                                 General Comments: Pt able to converse and follow instructions without concern     General Comments       Exercises     Shoulder Instructions      Home Living Family/patient expects to be discharged to:: Skilled nursing facility                                 Additional Comments: [facility]      Prior Functioning/Environment Prior Level of Function : Needs assist       Physical Assist : Mobility (physical);ADLs (physical)     Mobility Comments: Pt reports walking with a RW in room distances, w/c use for all other mobility ( to and from HD) ADLs Comments: Pt reports assistance with all ADLs at SNF        OT Problem List: Decreased strength;Decreased activity tolerance      OT Treatment/Interventions: Therapeutic exercise;Energy conservation;Therapeutic activities;Self-care/ADL training;Patient/family education    OT Goals(Current goals can be found in the care plan section) Acute Rehab OT Goals Patient Stated Goal: To feel better OT Goal Formulation: With patient Time For Goal Achievement: 07/02/23 Potential to Achieve Goals: Fair ADL Goals Pt Will Perform Grooming: with set-up;sitting Pt Will Transfer to Toilet: with modified independence;bedside  commode;ambulating Pt Will Perform Toileting - Clothing Manipulation and hygiene: with modified independence;sit to/from stand  OT Frequency: Min 1X/week    Co-evaluation PT/OT/SLP Co-Evaluation/Treatment: Yes Reason for Co-Treatment: For patient/therapist safety;To address functional/ADL transfers PT goals addressed during session: Mobility/safety with mobility;Balance OT goals addressed during session: ADL's and self-care;Proper use of Adaptive equipment and DME      AM-PAC OT "6 Clicks" Daily Activity     Outcome Measure Help from another person eating meals?: None Help from another person taking care of personal grooming?: A Little Help from another person toileting, which includes using toliet, bedpan, or urinal?: A Lot Help from another person bathing (including washing, rinsing, drying)?: A Lot Help from another person to put on and taking off regular upper body clothing?: A Lot Help from another person to put on and taking off regular lower body clothing?: A Lot 6 Click Score: 15   End of Session Equipment Utilized During Treatment: Rolling walker (2 wheels);Oxygen Nurse Communication: Mobility status  Activity Tolerance: Patient tolerated treatment well Patient left: in chair;with call bell/phone within reach  OT Visit Diagnosis: Unsteadiness on  feet (R26.81)                Time: 5784-6962 OT Time Calculation (min): 20 min Charges:  OT General Charges $OT Visit: 1 Visit OT Evaluation $OT Eval Moderate Complexity: 1 [address], OTS 06/18/2023, 12:08 PM

## 2023-06-18 NOTE — TOC Progression Note (Signed)
Transition of Care [facility]) - Progression Note    Patient Details  Name: Jacqueline [name] MRN: [id number] Date of Birth: 1946-01-08  Transition of Care [facility]) CM/SW Contact  Kreg Shropshire, RN Phone Number: 06/18/2023, 11:16 AM  Clinical Narrative:    Dr. Karna Christmas asked cm to speak with pt regarding LTAC. Cm asked pt preference on LTAC in the area. Cm offered [facility]. [facility] Cm sent message to [facility] liaison Cassell Clement to see if pt is LTAC appropriate.    Expected Discharge Plan: Long Term Nursing Home Barriers to Discharge: Continued Medical Work up  Expected Discharge Plan and Services       Living arrangements for the past 2 months: Skilled Nursing Facility Expected Discharge Date: 06/10/23                                     Social Determinants of Health (SDOH) Interventions SDOH Screenings   Food Insecurity: No Food Insecurity (06/07/2023)  Housing: Low Risk  (06/07/2023)  Transportation Needs: No Transportation Needs (06/07/2023)  Utilities: Not At Risk (06/07/2023)  Depression (PHQ2-9): Low Risk  (06/27/2021)  Financial Resource Strain: Medium Risk (06/27/2021)  Physical Activity: Inactive (06/27/2021)  Stress: No Stress Concern Present (06/27/2021)  Tobacco Use: Medium Risk (06/07/2023)    Readmission Risk Interventions    05/22/2022   12:26 PM  Readmission Risk Prevention Plan  Transportation Screening Complete  PCP or Specialist Appt within 3-5 Days Complete  HRI or Home Care Consult Complete  Social Work Consult for Recovery Care Planning/Counseling Complete  Palliative Care Screening Complete  Medication Review Oceanographer) Complete

## 2023-06-19 DIAGNOSIS — R001 Bradycardia, unspecified: Secondary | ICD-10-CM | POA: Diagnosis not present

## 2023-06-19 DIAGNOSIS — I959 Hypotension, unspecified: Secondary | ICD-10-CM | POA: Diagnosis not present

## 2023-06-19 DIAGNOSIS — N186 End stage renal disease: Secondary | ICD-10-CM | POA: Diagnosis not present

## 2023-06-19 LAB — RENAL FUNCTION PANEL
Albumin: 3.3 g/dL — ABNORMAL LOW (ref 3.5–5.0)
Anion gap: 13 (ref 5–15)
BUN: 75 mg/dL — ABNORMAL HIGH (ref 8–23)
CO2: 21 mmol/L — ABNORMAL LOW (ref 22–32)
Calcium: 7.7 mg/dL — ABNORMAL LOW (ref 8.9–10.3)
Chloride: 95 mmol/L — ABNORMAL LOW (ref 98–111)
Creatinine, Ser: 6.55 mg/dL — ABNORMAL HIGH (ref 0.44–1.00)
GFR, Estimated: 6 mL/min — ABNORMAL LOW (ref >60)
Glucose, Bld: 167 mg/dL — ABNORMAL HIGH (ref 70–99)
Phosphorus: 4.4 mg/dL (ref 2.5–4.6)
Potassium: 4.3 mmol/L (ref 3.5–5.1)
Sodium: 129 mmol/L — ABNORMAL LOW (ref 135–145)

## 2023-06-19 LAB — CBC
HCT: 29.9 % — ABNORMAL LOW (ref 36.0–46.0)
Hemoglobin: 9.4 g/dL — ABNORMAL LOW (ref 12.0–15.0)
MCH: 31.5 pg (ref 26.0–34.0)
MCHC: 31.4 g/dL (ref 30.0–36.0)
MCV: 100.3 fL — ABNORMAL HIGH (ref 80.0–100.0)
Platelets: 154 10*3/uL (ref 150–400)
RBC: 2.98 MIL/uL — ABNORMAL LOW (ref 3.87–5.11)
RDW: 15.9 % — ABNORMAL HIGH (ref 11.5–15.5)
WBC: 11.2 10*3/uL — ABNORMAL HIGH (ref 4.0–10.5)
nRBC: 0 % (ref 0.0–0.2)

## 2023-06-19 LAB — GLUCOSE, CAPILLARY
Glucose-Capillary: 108 mg/dL — ABNORMAL HIGH (ref 70–99)
Glucose-Capillary: 113 mg/dL — ABNORMAL HIGH (ref 70–99)
Glucose-Capillary: 140 mg/dL — ABNORMAL HIGH (ref 70–99)
Glucose-Capillary: 142 mg/dL — ABNORMAL HIGH (ref 70–99)
Glucose-Capillary: 89 mg/dL (ref 70–99)

## 2023-06-19 LAB — MAGNESIUM: Magnesium: 1.9 mg/dL (ref 1.7–2.4)

## 2023-06-19 NOTE — Progress Notes (Signed)
   Patient Name: Jacqueline [name] Date of Encounter: 06/19/2023 [facility] Cardiologist: Lorine Bears, MD   Interval Summary  .    Patient examined this afternoon, as she was in hemodialysis most of the morning.  She denies chest pain, shortness of breath, palpitations, and lightheadedness.  She notes that there were concerns about low BP at dialysis today.  She was dialyzed for 3.5 hours with 1.5L of fluid removed.  Vital Signs .    Vitals:   06/19/23 1230 06/19/23 1300 06/19/23 1400 06/19/23 1500  BP:      Pulse:  (!) 57 (!) 46 63  Resp:  15 15 15   Temp: 98.3 F (36.8 C)     TempSrc: Axillary     SpO2:  94% 92% 100%  Weight:      Height:        Intake/Output Summary (Last 24 hours) at 06/19/2023 1732 Last data filed at 06/19/2023 1149 Gross per 24 hour  Intake 379.33 ml  Output 1500 ml  Net -1120.67 ml      06/19/2023   12:01 PM 06/19/2023    7:49 AM 06/19/2023    1:28 AM  Last 3 Weights  Weight (lbs) 265 lb 14 oz 269 lb 2.9 oz 268 lb 1.3 oz  Weight (kg) 120.6 kg 122.1 kg 121.6 kg      Telemetry/ECG    Atrial fibrillation with slow ventricular response (rates mostly 45-70 bpm) - Personally Reviewed  Physical Exam .   GEN: No acute distress.   Neck: Unable to assess JVP due to body habitus. Cardiac: Distant heart sounds.  Bradycardic and irregularly. Respiratory: Mildly diminished breath sounds throughout. GI: Soft, nontender, non-distended  MS: Trace pretibial edema.  Assessment & Plan .     Hypotension and bradycardia: Relatively stable.  Pacemaker placement was previously recommended by EP, though elevated heart rates earlier in the admission did not seem to improve her blood pressure.  Vascular evaluation of HD fistula does not suggest high-flow state. -Continue current doses of midodrine (nephrology concerned that higher doses earlier this week may have worsened bradycardia), fludrocortisone, and droxidopa. -Reconsult EP tomorrow to determine  if any benefit from PPM now that patient has been weaned off norepinephrine. -Recommend continued dialysis in spite of low systemic BP, given lack of symptoms. -Attempt to mobilize with PT to see if she is symptomatic and if activity helps her BP.  Severe pulmonary hypertension: This is likely exacerbating hypotension, as sudden changes in preload could drop her BP.  She is not a candidate for targeted PH therapies in the setting of her ESRD per prior conversations with [facility].  Not a candidate for interventions on moderate-severe TR.  Persistent atrial fibrillation: Low HR persists. -Reengage EP tomorrow. -Continue heparin infusion until it is certain that no invasive procedures are planned, then transition to [facility].  CAD: No angina reported. -Continue secondary prevention.  Disposition: Prognosis remains poor.  I mentioned that if we are unable to effectively dialyze her due to hypotension, only alternative would be transitioning to Hospice.  Her response was, "No."  For questions or updates, please contact [facility] Please consult [email or web address] for contact info under [facility].    Signed, Yvonne Kendall, MD

## 2023-06-19 NOTE — Progress Notes (Signed)
  Received patient in bed to unit.   Informed consent signed and in chart.    TX duration: 3.5hrs     Transported back  Hand-off given to patient's nurse. No c/o and no distress noted    Access used: L AVF Access issues: none   Total UF removed: 1.5L Medication(s) given:  new heparin baq hung  Post HD VS: 102/46 Post HD weight: 120.kg     Lynann Beaver  [facility]

## 2023-06-19 NOTE — Progress Notes (Signed)
[facility]  ROUNDING NOTE   Subjective:   Patient seen and evaluated during dialysis   HEMODIALYSIS FLOWSHEET:  Blood Flow Rate (mL/min): 300 mL/min Arterial Pressure (mmHg): -100 mmHg Venous Pressure (mmHg): 160 mmHg TMP (mmHg): 17 mmHg Ultrafiltration Rate (mL/min): 604 mL/min Dialysate Flow Rate (mL/min): 300 ml/min Dialysis Fluid Bolus: Normal Saline  Tolerating treatment well  Sinus brady to 39  Remains off pressors  Objective:  Vital signs in last 24 hours:  Temp:  [97.6 F (36.4 C)-98.6 F (37 C)] 97.6 F (36.4 C) (07/18 1149) Pulse Rate:  [46-61] 55 (07/18 1149) Resp:  [12-21] 14 (07/18 1149) BP: (73-106)/(36-83) 102/46 (07/18 1149) SpO2:  [93 %-100 %] 100 % (07/18 1149) Arterial Line BP: (92-124)/(34-49) 92/35 (07/18 0600) Weight:  [120.6 kg-122.1 kg] 120.6 kg (07/18 1201)  Weight change: 0 kg Filed Weights   06/19/23 0128 06/19/23 0749 06/19/23 1201  Weight: 121.6 kg 122.1 kg 120.6 kg    Intake/Output: I/O last 3 completed shifts: In: 664.4 [P.O.:200; I.V.:464.4] Out: 1 [Stool:1]   Intake/Output this shift:  Total I/O In: -  Out: 1500 [Other:1500]  Physical Exam: General: NAD, laying in bed  Head: Normocephalic, atraumatic. Moist oral mucosal membranes  Eyes: Anicteric  Lungs:  Clear to auscultation, normal effort  Heart: Regular rate and rhythm  Abdomen:  Soft, nontender  Extremities: no peripheral edema.  Neurologic: Alert and oriented, moving all four extremities  Skin: No lesions  Access: Lt AVF    Basic Metabolic Panel: Recent Labs  Lab 06/13/23 0604 06/14/23 0405 06/15/23 0438 06/16/23 0414 06/17/23 0508 06/18/23 0400 06/19/23 0340  NA 132*   < > 132* 130* 129* 131* 129*  K 4.1   < > 3.8 4.5 4.6 4.1 4.3  CL 94*   < > 96* 94* 93* 96* 95*  CO2 27   < > 27 27 24 23  21*  GLUCOSE 243*   < > 206* 132* 136* 112* 167*  BUN 33*   < > 38* 57* 78* 51* 75*  CREATININE 4.60*   < > 4.10* 5.64* 7.25* 5.00* 6.55*  CALCIUM  7.7*   < > 7.8* 7.8* 7.8* 7.9* 7.7*  MG 2.2  --  1.8 1.8  --  1.9 1.9  PHOS 1.9*   < > 2.2* 3.9 4.6 3.6 4.4   < > = values in this interval not displayed.    Liver Function Tests: Recent Labs  Lab 06/15/23 0438 06/16/23 0414 06/17/23 0508 06/18/23 0400 06/19/23 0340  ALBUMIN 2.9* 3.1* 2.9* 3.2* 3.3*   No results for input(s): "LIPASE", "AMYLASE" in the last 168 hours.  No results for input(s): "AMMONIA" in the last 168 hours.  CBC: Recent Labs  Lab 06/15/23 0438 06/16/23 0414 06/17/23 0508 06/18/23 0400 06/19/23 0340  WBC 12.5* 12.0* 10.8* 9.5 11.2*  HGB 10.0* 9.7* 9.2* 9.1* 9.4*  HCT 32.2* 30.4* 29.1* 29.0* 29.9*  MCV 99.1 99.0 97.0 100.3* 100.3*  PLT 148* 153 145* 141* 154    Cardiac Enzymes: No results for input(s): "CKTOTAL", "CKMB", "CKMBINDEX", "TROPONINI" in the last 168 hours.  BNP: Invalid input(s): "POCBNP"  CBG: Recent Labs  Lab 06/18/23 0732 06/18/23 1151 06/18/23 1540 06/18/23 2136 06/19/23 0728  GLUCAP 119* 167* 160* 132* 140*    Microbiology: Results for orders placed or performed during the hospital encounter of 06/07/23  Blood culture (routine x 2)     Status: None   Collection Time: 06/07/23 12:54 PM   Specimen: BLOOD  Result Value Ref  Range Status   Specimen Description BLOOD RIGHT ANTECUBITAL  Final   Special Requests   Final    BOTTLES DRAWN AEROBIC AND ANAEROBIC Blood Culture adequate volume   Culture   Final    NO GROWTH 5 DAYS Performed at [facility], [address] [place], [place] [address]    Report Status 06/12/2023 FINAL  Final  Blood culture (routine x 2)     Status: None   Collection Time: 06/07/23 12:54 PM   Specimen: BLOOD  Result Value Ref Range Status   Specimen Description BLOOD BLOOD RIGHT FOREARM  Final   Special Requests   Final    BOTTLES DRAWN AEROBIC AND ANAEROBIC Blood Culture adequate volume   Culture   Final    NO GROWTH 5 DAYS Performed at [facility], [address],  [place], [place] [address]    Report Status 06/12/2023 FINAL  Final  MRSA Next Gen by PCR, Nasal     Status: None   Collection Time: 06/07/23 10:18 PM   Specimen: Nasal Mucosa; Nasal Swab  Result Value Ref Range Status   MRSA by PCR Next Gen NOT DETECTED NOT DETECTED Final    Comment: (NOTE) The GeneXpert MRSA Assay (FDA approved for NASAL specimens only), is one component of a comprehensive MRSA colonization surveillance program. It is not intended to diagnose MRSA infection nor to guide or monitor treatment for MRSA infections. Test performance is not FDA approved in patients less than 49 years old. Performed at [facility], [address] [place], [place] [address]     Coagulation Studies: No results for input(s): "LABPROT", "INR" in the last 72 hours.  Urinalysis: No results for input(s): "COLORURINE", "LABSPEC", "PHURINE", "GLUCOSEU", "HGBUR", "BILIRUBINUR", "KETONESUR", "PROTEINUR", "UROBILINOGEN", "NITRITE", "LEUKOCYTESUR" in the last 72 hours.  Invalid input(s): "APPERANCEUR"    Imaging: No results found.   Medications:    sodium chloride Stopped (06/10/23 1900)   sodium chloride     anticoagulant sodium citrate     heparin 1,350 Units/hr (06/19/23 0942)   norepinephrine (LEVOPHED) Adult infusion Stopped (06/17/23 0245)    Chlorhexidine Gluconate Cloth  6 each Topical QHS   droxidopa  100 mg Oral TID WC   ezetimibe  10 mg Oral Daily   feeding supplement (NEPRO CARB STEADY)  237 mL Oral TID BM   fludrocortisone  0.2 mg Oral BID   insulin aspart  0-20 Units Subcutaneous TID WC   insulin aspart  0-5 Units Subcutaneous QHS   insulin glargine-yfgn  5 Units Subcutaneous Daily   loratadine  10 mg Oral Daily   midodrine  10 mg Oral TID WC   multivitamin  1 tablet Oral QHS   pregabalin  100 mg Oral QHS   pregabalin  50 mg Oral BID   rosuvastatin  10 mg Oral Daily   senna-docusate  1 tablet Oral BID   sodium chloride flush  3 mL Intravenous Q12H   sodium  chloride, acetaminophen, alteplase, alum & mag hydroxide-simeth, anticoagulant sodium citrate, bisacodyl, docusate sodium, guaiFENesin-dextromethorphan, heparin, HYDROcodone-acetaminophen, lidocaine (PF), lidocaine-prilocaine, morphine injection, nitroGLYCERIN, ondansetron (ZOFRAN) IV, mouth rinse, pentafluoroprop-tetrafluoroeth, polyethylene glycol, sodium chloride flush  Assessment/ Plan:  [name] is a 77 y.o.  female  with a PMHx of ESRD on HD TTS at [facility], diabetes mellitus type 2, chronic hypotension, peripheral arterial disease, obstructive sleep apnea, GERD, obesity, hyperlipidemia, anemia chronic kidney disease, secondary hyperparathyroidism, who was admitted to [facility] on 06/07/2023 for evaluation of generalized weakness.  1  ESRD: on hemodialysis  - Continue  TTS schedule.  - Receiving treatment today, UF goal 1.5-2L as tolerated.  - Sinus brady during treatment, asymptomatic - Next treatment scheduled Saturday  2.  Hypotension with severe pulmonary hypertension..  Chronic in nature.  Systolic blood pressure usually in the 80s as an outpatient.   - midodrine 10 mg 3 times daily as outpatient.   - fludrocortisone - droxidopa  - No pressors  3. Anemia of chronic kidney disease Lab Results  Component Value Date   HGB 9.4 (L) 06/19/2023  ESA with next HD treatment  Continue Mircera as an outpatient.  4. Diabetes mellitus type II with chronic kidney disease/renal manifestations: noninsulin dependent.  Hemoglobin A1c is 5.8 on 06/07/23.   - continue glucose control.   5. Secondary Hyperparathyroidism:    Lab Results  Component Value Date   PTH 388.6 (H) 01/17/2014   CALCIUM 7.7 (L) 06/19/2023   CAION 0.92 (L) 06/11/2023   PHOS 4.4 06/19/2023   - Holding Auryxia and Sensipar     LOS: 12 Fumi Guadron 7/18/202412:16 PM

## 2023-06-19 NOTE — Progress Notes (Signed)
PT Cancellation Note  Patient Details Name: Jacqueline [name] MRN: [id number] DOB: 1946/05/30   Cancelled Treatment:    Reason Eval/Treat Not Completed: Patient at procedure or test/unavailable Patient in HD. Will re-attempt when patient available.   [name] 06/19/2023, 11:48 AM

## 2023-06-19 NOTE — Progress Notes (Addendum)
Progress Note    Jacqueline [name]  WUJ:[id number] DOB: July 28, 1946  DOA: 06/07/2023 PCP: Macky Lower, MD      Brief Narrative:    Medical records reviewed and are as summarized below:  Jacqueline [name] is a 77 y.o. female with PMHx significant for HTN, chronic hypotension, type II DM, ESRD on HD TTS, Chronic debility, CAD s/p stent, PAF, OSA, PAD, Left knee arthritis, Chronic lower back pain, GERD, Obesity, HLD who presented to the ED from the dialysis center with generalized weakness and hypotension with BP of 72/42.  Reportedly, patient had nausea, vomiting, diarrhea and left lower quadrant abdominal pain the night prior to admission.  ED Course:  Initial vital signs showed HR of 50 beats/minute, BP 81/51 mm Hg, the RR 16 breaths/minute, and the oxygen saturation 91% on 2L and a temperature of 97.51F (36.6C).   Pertinent Labs/Diagnostics.  EKG:?Afib vs junctional bradycardia rate 40bmp. Incomplete RBB,   CXR, CT scan obtained and show no signs of infection or other abnormality. Patient given, midodrine, atropine for significant bradycardia and  500 cc of fluids and started on broad-spectrum antibiotics Vanco cefepime and Flagyl for  suspected sepsis of unknown source. Patient remained hypotensive despite  intervention as above. PCCM consulted for possible pressors.    Significant Hospital Events: Including procedures, antibiotic start and stop dates in addition to other pertinent events   06/07/2023: admit to ICU with bradycardia, started on dopamine 06/08/2023: weaned off dopamine, hypotensive again, dopamine restarted 06/09/2023:  Remains hypotensive requiring Levophed, (pt is vasculopath, doubt accuracy of cuff BP as pt is awake, alert, and asymptomatic).  Attempted Arterial line but unsuccessful, Vascular Surgery consulted to assist with placement. Nephrology consulted, received HD. 06/10/2023: Continues to require Levophed (6 mcg currently), when Levo weaned down does exhibit  some Bradycardia in the upper 50's.  Cardiology considering R Heart Cath today. Holding on transfer to [facility] for pacer at this time. 06/11/2023: Levophed weaning down, this AM. LHC/RHC with mild-moderate non-obstructive CAD, patent proximal RCA stent, mild-moderately elevated L heart filling pressures, severely elevated R heart/pulm artery pressures, mildly reduced CO/CI. Episode of chest pressure with HD, resolved with morphine. 06/12/2023: Additional episodes of chest tightness/pressure overnight (0150 and 0554). EKG without changes, trops flat. Hypertensive to 227/80, then with NE off HR dropped to 29.  Droxidopa was eventually stopped. 06/13/2023: No events overnight, no chest pain or hypertension. Remains on Levophed.  Decrease Midodrine to 5 mg TID and restart Droxidopa, increase Florinef and stop Solu-cortef in attempts to wean Levophed off. Obtain CT Chest to rule out Pulmonary pathology for etiology of PH. 06/14/2023: chest pain yesterday. Re-started on levophed over night. CT shows no acute finding of ILD. Droxidopa re-started yesterday. 06/15/2023: completed dialysis yesterday. Re-started again on levophed.  06/16/23- met with cardiology to discuss treatment options.  At this time patient remains severely hypotensive on maximal medical management. Her Renal impairment and hypotension limit therapy.  Recommendation for palliative care at this time due to poor prognosis. 06/17/23- patient remains hypotensive with poor prognosis. She is requiring less vasopressor support.  06/18/23- patient is off vasopressors.  Still has overall very poor prognosis.  Recommendation for palliative/hospice.     Assessment/Plan:   Principal Problem:   Symptomatic bradycardia Active Problems:   Permanent atrial fibrillation (HCC)   End stage renal disease (HCC)   Bradycardia   Morbid obesity (HCC)   Severe pulmonary hypertension (HCC)   Goals of care, counseling/discussion  Body mass index is 46.2  kg/m.  (Morbid obesity)   Circulatory shock (suspected cardiogenic shock), chronic hypotension: Continue midodrine, fludrocortisone and droxidopa.  She was weaned off of IV Levophed drip on 06/16/2024.   Persistent atrial fibrillation, bradycardia: She was evaluated by EP cardiologist and she is deemed not to be a candidate for pacemaker at this time.  She is not on any rate control medications because of bradycardia. He is on IV heparin drip.  Monitor heparin level per protocol. Eliquis on hold.   CAD s/p RCA PCI, severe pulmonary hypertension, moderate to severe TR: S/p right and left heart cath on 06/11/2023 showed patent RCA stent, mid to moderate nonobstructive CAD, severely elevated right heart and pulmonary artery pressures.   ESRD on hemodialysis anemia of CKD: Follow-up with nephrologist for hemodialysis.   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 Hemoglobin A1c was 5.8 on 06/07/2023   Thrombocytopenia: Improved   Other comorbidities include chronic debility, chronic low back pain, left knee arthritis    Diet Order             Diet renal with fluid restriction Fluid restriction: 1200 mL Fluid; Room service appropriate? Yes; Fluid consistency: Thin  Diet effective now                            Consultants: Cardiologist Intensivist Nephrologist Palliative care  Procedures: None    Medications:    Chlorhexidine Gluconate Cloth  6 each Topical QHS   droxidopa  100 mg Oral TID WC   ezetimibe  10 mg Oral Daily   feeding supplement (NEPRO CARB STEADY)  237 mL Oral TID BM   fludrocortisone  0.2 mg Oral BID   insulin aspart  0-20 Units Subcutaneous TID WC   insulin aspart  0-5 Units Subcutaneous QHS   insulin glargine-yfgn  5 Units Subcutaneous Daily   loratadine  10 mg Oral Daily   midodrine  10 mg Oral TID WC   multivitamin  1 tablet  Oral QHS   pregabalin  100 mg Oral QHS   pregabalin  50 mg Oral BID   rosuvastatin  10 mg Oral Daily   senna-docusate  1 tablet Oral BID   sodium chloride flush  3 mL Intravenous Q12H   Continuous Infusions:  sodium chloride Stopped (06/10/23 1900)   sodium chloride     anticoagulant sodium citrate     heparin 1,350 Units/hr (06/19/23 0942)   norepinephrine (LEVOPHED) Adult infusion Stopped (06/17/23 0245)     Anti-infectives (From admission, onward)    Start     Dose/Rate Route Frequency Ordered Stop   06/10/23 1200  vancomycin (VANCOCIN) IVPB 1000 mg/200 mL premix  Status:  Discontinued       Placed in "Followed by" Linked Group   1,000 mg 200 mL/hr over 60 Minutes Intravenous Every T-Th-Sa (Hemodialysis) 06/08/23 1058 06/09/23 1029   06/08/23 1145  vancomycin (VANCOCIN) IVPB 1000 mg/200 mL premix       Placed in "Followed by" Linked Group   1,000 mg 200 mL/hr over 60 Minutes Intravenous  Once 06/08/23 1058 06/08/23 1655   06/08/23 1100  piperacillin-tazobactam (ZOSYN) IVPB 2.25 g  Status:  Discontinued        2.25 g 100 mL/hr over 30 Minutes Intravenous Every 8 hours 06/08/23 1058 06/09/23 1029   06/07/23 1400  metroNIDAZOLE (FLAGYL) IVPB  500 mg        500 mg 100 mL/hr over 60 Minutes Intravenous  Once 06/07/23 1346 06/07/23 1652   06/07/23 1400  ceFEPIme (MAXIPIME) 2 g in sodium chloride 0.9 % 100 mL IVPB        2 g 200 mL/hr over 30 Minutes Intravenous  Once 06/07/23 1349 06/07/23 1512   06/07/23 1400  vancomycin (VANCOCIN) IVPB 1000 mg/200 mL premix        1,000 mg 200 mL/hr over 60 Minutes Intravenous  Once 06/07/23 1349 06/07/23 1652              Family Communication/Anticipated D/C date and plan/Code Status   DVT prophylaxis: SCDs Start: 06/11/23 1358     Code Status: Full Code  Family Communication: None Disposition Plan: Plan to discharge to SNF   Status is: Inpatient Remains inpatient appropriate because: Bradycardia,  hypotension       Subjective:   Interval events noted.  She was seen at the hemodialysis unit this morning.  She has no complaints.  No dizziness, shortness of breath or chest pain.   Objective:    Vitals:   06/19/23 1000 06/19/23 1030 06/19/23 1100 06/19/23 1130  BP:  99/69 (!) 93/46 90/68  Pulse:  (!) 49 (!) 50 (!) 48  Resp:  16 13 17   Temp:      TempSrc:      SpO2: 100% 100% 100% 97%  Weight:      Height:       No data found.   Intake/Output Summary (Last 24 hours) at 06/19/2023 1150 Last data filed at 06/19/2023 0600 Gross per 24 hour  Intake 536.46 ml  Output 1 ml  Net 535.46 ml   Filed Weights   06/18/23 0425 06/19/23 0128 06/19/23 0749  Weight: 121.6 kg 121.6 kg 122.1 kg    Exam:  GEN: NAD SKIN: Warm and dry EYES: No pallor or icterus ENT: MMM CV: RRR PULM: CTA B ABD: soft, obese, NT, +BS CNS: AAO x 3, non focal EXT: No edema or tenderness        Data Reviewed:   I have personally reviewed following labs and imaging studies:  Labs: Labs show the following:   Basic Metabolic Panel: Recent Labs  Lab 06/13/23 0604 06/14/23 0405 06/15/23 0438 06/16/23 0414 06/17/23 0508 06/18/23 0400 06/19/23 0340  NA 132*   < > 132* 130* 129* 131* 129*  K 4.1   < > 3.8 4.5 4.6 4.1 4.3  CL 94*   < > 96* 94* 93* 96* 95*  CO2 27   < > 27 27 24 23  21*  GLUCOSE 243*   < > 206* 132* 136* 112* 167*  BUN 33*   < > 38* 57* 78* 51* 75*  CREATININE 4.60*   < > 4.10* 5.64* 7.25* 5.00* 6.55*  CALCIUM 7.7*   < > 7.8* 7.8* 7.8* 7.9* 7.7*  MG 2.2  --  1.8 1.8  --  1.9 1.9  PHOS 1.9*   < > 2.2* 3.9 4.6 3.6 4.4   < > = values in this interval not displayed.   GFR Estimated Creatinine Clearance: 9 mL/min (A) (by C-G formula based on SCr of 6.55 mg/dL (H)). Liver Function Tests: Recent Labs  Lab 06/15/23 0438 06/16/23 0414 06/17/23 0508 06/18/23 0400 06/19/23 0340  ALBUMIN 2.9* 3.1* 2.9* 3.2* 3.3*   No results for input(s): "LIPASE", "AMYLASE" in the  last 168 hours. No results for input(s): "AMMONIA" in the last  168 hours. Coagulation profile No results for input(s): "INR", "PROTIME" in the last 168 hours.  CBC: Recent Labs  Lab 06/15/23 0438 06/16/23 0414 06/17/23 0508 06/18/23 0400 06/19/23 0340  WBC 12.5* 12.0* 10.8* 9.5 11.2*  HGB 10.0* 9.7* 9.2* 9.1* 9.4*  HCT 32.2* 30.4* 29.1* 29.0* 29.9*  MCV 99.1 99.0 97.0 100.3* 100.3*  PLT 148* 153 145* 141* 154   Cardiac Enzymes: No results for input(s): "CKTOTAL", "CKMB", "CKMBINDEX", "TROPONINI" in the last 168 hours. BNP (last 3 results) No results for input(s): "PROBNP" in the last 8760 hours. CBG: Recent Labs  Lab 06/18/23 0732 06/18/23 1151 06/18/23 1540 06/18/23 2136 06/19/23 0728  GLUCAP 119* 167* 160* 132* 140*   D-Dimer: No results for input(s): "DDIMER" in the last 72 hours. Hgb A1c: No results for input(s): "HGBA1C" in the last 72 hours. Lipid Profile: No results for input(s): "CHOL", "HDL", "LDLCALC", "TRIG", "CHOLHDL", "LDLDIRECT" in the last 72 hours. Thyroid function studies: No results for input(s): "TSH", "T4TOTAL", "T3FREE", "THYROIDAB" in the last 72 hours.  Invalid input(s): "FREET3" Anemia work up: No results for input(s): "VITAMINB12", "FOLATE", "FERRITIN", "TIBC", "IRON", "RETICCTPCT" in the last 72 hours. Sepsis Labs: Recent Labs  Lab 06/16/23 0414 06/17/23 0508 06/18/23 0400 06/19/23 0340  WBC 12.0* 10.8* 9.5 11.2*    Microbiology No results found for this or any previous visit (from the past 240 hour(s)).  Procedures and diagnostic studies:  No results found.             LOS: 12 days   Jacqueline [name]  [employer] loss adjuster on [email or web address]. If 7PM-7AM, please contact night-coverage at [email or web address]     06/19/2023, 11:50 AM

## 2023-06-19 NOTE — Progress Notes (Signed)
NAME:  Jacqueline [name], MRN:  [id number], DOB:  11-21-1946, LOS: 12 ADMISSION DATE:  06/07/2023, CONSULTATION DATE:  06/07/2023 REFERRING MD:  Dr. Allena Katz, CHIEF COMPLAINT:  Hypotension   Brief Pt Description / Synopsis:  77 y.o woman with PMHx significant for chronic hypotension (SBP in 80's on Midodrine 10 mg TID) and ESRD on HD (TTS) admitted with Cardiogenic shock due to Bradycardia.   History of Present Illness:  77 y.o female with significant PMHx significant for HTN, T2DM, ESRD on HD TTS, Chronic debility, CAD s/p stent, PAF, OSA, PAD, Left knee arthritis, Chronic lower back pain, GERD, Obesity, HLD who presented to the ED from Dialysis with generalized weakness x 4 days.   EMS report: NVD and LLQ abdominal pain since last night. States tried to eat yesterday, each time she ate she vomited, then had diarrhea last night. No NVD today. She went to Dialysis today and was noted with low BP of 72/42. She usually runs lows BP and takes Midodrine but today her BP was lower than usually so she was not dialyzed but was sent to ED for further evaluation.   ED Course:  Initial vital signs showed HR of 50 beats/minute, BP 81/51 mm Hg, the RR 16 breaths/minute, and the oxygen saturation 91% on 2L and a temperature of 97.90F (36.6C).   Pertinent Labs/Diagnostics.   Na+/ K+: 133/3.4 Glucose:130 BUN/Cr.:55/7.76 WBC:11.5 Plts: 146 PCT: pending Lactic acid: 1.3 EKG:?Afib vs junctional bradycardia rate 40bmp. Incomplete RBB,   CXR, CT scan obtained and show no signs of infection or other abnormality. Patient given, midodrine, atropine for significant bradycardia and  500 cc of fluids and started on broad-spectrum antibiotics Vanco cefepime and Flagyl for  suspected sepsis of unknown source. Patient remained hypotensive despite  intervention as above. PCCM consulted for possible pressors.  Pertinent Medical History:   Past Medical History:  Diagnosis Date   Arthritis    "left knee" (12/04/2016)   CAD  (coronary artery disease)    a. 12/2016 NSTEMI/PCI: LM nl, LaD 20p/d, D1 20, OM2 20, OM3 20, RCA 64m (3.0x18 Resolute DES). EF 65%.   Chronic lower back pain    Diverticulosis    on CT   ESRD on dialysis [facility])    GERD (gastroesophageal reflux disease)    Hiatal hernia    History of echocardiogram    a. 09/2022 Echo: EF 60-65%, mild asymm LVH, mod reduced RV fxn, RVSP . Mildly dil RA. Mild AI. Ao sclerosis. Mild MR.   Hyperlipidemia    Hypertension    a. 12/2016 labetalol d/c'd 2/2 hypotension in HD.   Morbid obesity (HCC)    Obesity    Persistent atrial fibrillation (HCC)    a. 05/2023 Zio: 100% afib avg HR 52 (32-99).   Pneumonia    "couple times" (12/04/2016)   Type II diabetes mellitus (HCC)    Micro Data:  7/6: Blood culture x2>> negative 7/6: MRSA PCR >> negative  Antimicrobials:   Anti-infectives (From admission, onward)    Start     Dose/Rate Route Frequency Ordered Stop   06/10/23 1200  vancomycin (VANCOCIN) IVPB 1000 mg/200 mL premix  Status:  Discontinued       Placed in "Followed by" Linked Group   1,000 mg 200 mL/hr over 60 Minutes Intravenous Every T-Th-Sa (Hemodialysis) 06/08/23 1058 06/09/23 1029   06/08/23 1145  vancomycin (VANCOCIN) IVPB 1000 mg/200 mL premix       Placed in "Followed by" Linked Group   1,000 mg 200  mL/hr over 60 Minutes Intravenous  Once 06/08/23 1058 06/08/23 1655   06/08/23 1100  piperacillin-tazobactam (ZOSYN) IVPB 2.25 g  Status:  Discontinued        2.25 g 100 mL/hr over 30 Minutes Intravenous Every 8 hours 06/08/23 1058 06/09/23 1029   06/07/23 1400  metroNIDAZOLE (FLAGYL) IVPB 500 mg        500 mg 100 mL/hr over 60 Minutes Intravenous  Once 06/07/23 1346 06/07/23 1652   06/07/23 1400  ceFEPIme (MAXIPIME) 2 g in sodium chloride 0.9 % 100 mL IVPB        2 g 200 mL/hr over 30 Minutes Intravenous  Once 06/07/23 1349 06/07/23 1512   06/07/23 1400  vancomycin (VANCOCIN) IVPB 1000 mg/200 mL premix        1,000 mg 200 mL/hr over 60  Minutes Intravenous  Once 06/07/23 1349 06/07/23 1652      Significant Hospital Events: Including procedures, antibiotic start and stop dates in addition to other pertinent events   06/07/2023: admit to ICU with bradycardia, started on dopamine 06/08/2023: weaned off dopamine, hypotensive again, dopamine restarted 06/09/2023:  Remains hypotensive requiring Levophed, (pt is vasculopath, doubt accuracy of cuff BP as pt is awake, alert, and asymptomatic).  Attempted Arterial line but unsuccessful, Vascular Surgery consulted to assist with placement. Nephrology consulted, received HD. 06/10/2023: Continues to require Levophed (6 mcg currently), when Levo weaned down does exhibit some Bradycardia in the upper 50's.  Cardiology considering R Heart Cath today. Holding on transfer to [facility] for pacer at this time. 06/11/2023: Levophed weaning down, this AM. LHC/RHC with mild-moderate non-obstructive CAD, patent proximal RCA stent, mild-moderately elevated L heart filling pressures, severely elevated R heart/pulm artery pressures, mildly reduced CO/CI. Episode of chest pressure with HD, resolved with morphine. 06/12/2023: Additional episodes of chest tightness/pressure overnight (0150 and 0554). EKG without changes, trops flat. Hypertensive to 227/80, then with NE off HR dropped to 29.  Droxidopa was eventually stopped. 06/13/2023: No events overnight, no chest pain or hypertension. Remains on Levophed.  Decrease Midodrine to 5 mg TID and restart Droxidopa, increase Florinef and stop Solu-cortef in attempts to wean Levophed off. Obtain CT Chest to rule out Pulmonary pathology for etiology of PH. 06/14/2023: chest pain yesterday. Re-started on levophed over night. CT shows no acute finding of ILD. Droxidopa re-started yesterday. 06/15/2023: completed dialysis yesterday. Re-started again on levophed.  06/16/23- met with cardiology to discuss treatment options.  At this time patient remains severely hypotensive on  maximal medical management. Her Renal impairment and hypotension limit therapy.  Recommendation for palliative care at this time due to poor prognosis. 06/17/23- patient remains hypotensive with poor prognosis. She is requiring less vasopressor support.  06/18/23- patient is off vasopressors.  Still has overall very poor prognosis.  Recommendation for palliative/hospice. 06/19/23- no additional options are available for treatment, I met with cardiology and they do not recommend transfer or any other intervention.  Patient may pass away any moment with bradycardia and hypotension and we recommend comfort care measures.   Interim History / Subjective:  -Pt is sleepy  -Levophed re-started overnight  Objective:  Blood pressure (!) 100/46, pulse (!) 50, temperature 97.6 F (36.4 C), temperature source Oral, resp. rate 12, height 5\' 4"  (1.626 m), weight 121.6 kg, SpO2 95%.        Intake/Output Summary (Last 24 hours) at 06/19/2023 0825 Last data filed at 06/19/2023 0600 Gross per 24 hour  Intake 536.46 ml  Output 1 ml  Net 535.46  ml   Filed Weights   06/17/23 0500 06/18/23 0425 06/19/23 0128  Weight: 120.7 kg 121.6 kg 121.6 kg   Physical Examination: General: Chronically ill-appearing elderly woman in NAD. Pleasant and conversant, eating breakfast. HEENT: [place]/AT, anicteric sclera, PERRL, dry mucous membranes. Neuro: Awake, oriented x 4. Responds to verbal stimuli. Following commands consistently. Moves all 4 extremities spontaneously. Generalized weakness. CV: Variable HR, irregularly irregular rhythm, no m/g/r. PULM: Breathing even and unlabored on 2LNC. Lung fields CTAB in upper fields, faint bibasilar crackles. GI: Soft, nontender, nondistended. Normoactive bowel sounds. Extremities: No significant LE edema noted. Skin: Warm/dry, no rashes.  Resolved Hospital Problem List:     Assessment & Plan:   #Circulatory Shock: Suspect Cardiogenic given Bradycardia and hypotension with PH #Afib  with slow ventricular rate, improved #Pulmonary Hypertension #Moderate Tricuspid Regurgitation History of CAD, HLD, permanent A. Fib, chronic hypotension with SBP in 80's on Midodrine 10 mg TID Echo 7/7 with LVEF 60-65% with diastolic heart failure, RV systolic function mildly reduced, severe pulmonary hypertension, moderate tricuspid regurgitation. TSH/T4 WNL.  LHC/RHC 7/10 with mild-moderate non-obstructive CAD, patent proximal RCA stent, mild-moderately elevated L heart filling pressures, severely elevated R heart/pulm artery pressures, mildly reduced CO/CI. - Goal MAP > 65 - Avoiding fluid resuscitation in the setting of ESRD/HD - Levophed titrated to goal MAP; does best with low doses/not completely off-titrated; becomes bradycardic when NE completely off - Continue Florinef and increased midodrine - Cardiac monitoring - Optimized electrolytes for K > 4, Mg > 2 - Heparin gtt for now, transition back to Eliquis as able - Secondary prevention with Zetia, Crestor - Appreciate Cardiology/EP input, less likely HOHF due to CI 2.4 on cath - Consideration for leadless PM placement on hold - Deemed not a good candiate for valve repair or MitraClip given degree of PH and RV dysfunction - Unlikely to tolerate PH management - CT showing no evidence of ILD - Continue droxidopa and levophed as needed  #Initial Concern for Sepsis of unknown etiology ~ SEPSIS RULED OUT No clear sign of sepsis though patient is hypotensive. Blood cultures are drawn and are negative to date.  CXR without sign of pneumonia, unable to make urine - Trend WBC, fever curve; leukocytosis improved - Follow Cx data, 7/6 BCx NGTD - Off of antibiotics, low threshold for resumption if WBC uptrends/patient becomes febrile  #ESRD on HD  #Hyponatremia #AG Metabolic Acidosis Outpatient - on HD TThS via LUE AVF); dry weight 115kg. 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 Doubt patient would be able to tolerate PH treatment. No significant CAD to contribute to bradycardia. Less likely HOHF in the setting of CI 2.4.  - PMT consulted, appreciate assistance; will need ongoing GOC discussions if patient unable to tolerate more aggressive interventions -POOR PROGNOSIS , RECOMMEND HOSPICE   Best  Practice (right click and "Reselect all SmartList Selections" daily)   Diet/type: Regular DVT prophylaxis: Heparin gtt GI prophylaxis: N/A Lines: Right radial A-line, and is still needed Foley:  N/A Code Status:  full code Last date of multidisciplinary goals of care discussion [7/12 - patient updated at bedside]  Labs   CBC: Recent Labs  Lab 06/15/23 0438 06/16/23 0414 06/17/23 0508 06/18/23 0400 06/19/23 0340  WBC 12.5* 12.0* 10.8* 9.5 11.2*  HGB 10.0* 9.7* 9.2* 9.1* 9.4*  HCT 32.2* 30.4* 29.1* 29.0* 29.9*  MCV 99.1 99.0 97.0 100.3* 100.3*  PLT 148* 153 145* 141* 154  Basic Metabolic Panel: Recent Labs  Lab 06/13/23 0604 06/14/23 0405 06/15/23 0438 06/16/23 0414 06/17/23 0508 06/18/23 0400 06/19/23 0340  NA 132*   < > 132* 130* 129* 131* 129*  K 4.1   < > 3.8 4.5 4.6 4.1 4.3  CL 94*   < > 96* 94* 93* 96* 95*  CO2 27   < > 27 27 24 23  21*  GLUCOSE 243*   < >  206* 132* 136* 112* 167*  BUN 33*   < > 38* 57* 78* 51* 75*  CREATININE 4.60*   < > 4.10* 5.64* 7.25* 5.00* 6.55*  CALCIUM 7.7*   < > 7.8* 7.8* 7.8* 7.9* 7.7*  MG 2.2  --  1.8 1.8  --  1.9 1.9  PHOS 1.9*   < > 2.2* 3.9 4.6 3.6 4.4   < > = values in this interval not displayed.  GFR: Estimated Creatinine Clearance: 9 mL/min (A) (by C-G formula based on SCr of 6.55 mg/dL (H)). Recent Labs  Lab 06/16/23 0414 06/17/23 0508 06/18/23 0400 06/19/23 0340  WBC 12.0* 10.8* 9.5 11.2*  Liver Function Tests: Recent Labs  Lab 06/15/23 0438 06/16/23 0414 06/17/23 0508 06/18/23 0400 06/19/23 0340  ALBUMIN 2.9* 3.1* 2.9* 3.2* 3.3*   No results for input(s): "LIPASE", "AMYLASE" in the last 168 hours.  No results for input(s): "AMMONIA" in the last 168 hours.  ABG:    Component Value Date/Time   PHART 7.313 (L) 06/11/2023 1237   PCO2ART 48.7 (H) 06/11/2023 1237   PO2ART 90 06/11/2023 1237   HCO3 24.7 06/11/2023 1237   TCO2 26 06/11/2023 1237   ACIDBASEDEF 2.0 06/11/2023 1237   O2SAT 96 06/11/2023 1237   Coagulation Profile: No results for input(s): "INR", "PROTIME" in the last 168 hours.  Cardiac Enzymes: No results for input(s): "CKTOTAL", "CKMB", "CKMBINDEX", "TROPONINI" in the last 168 hours.  HbA1C: Hemoglobin A1C  Date/Time Value Ref Range Status  06/12/2021 12:00 AM 6.6  Final   Hgb A1c MFr Bld  Date/Time Value Ref Range Status  06/07/2023 07:44 PM 5.8 (H) 4.8 - 5.6 % Final    Comment:    (NOTE) Pre diabetes:          5.7%-6.4%  Diabetes:              >6.4%  Glycemic control for   <7.0% adults with diabetes   09/29/2022 04:55 PM 5.2 4.8 - 5.6 % Final    Comment:    (NOTE) Pre diabetes:          5.7%-6.4%  Diabetes:              >6.4%  Glycemic control for   <7.0% adults with diabetes    CBG: Recent Labs  Lab 06/18/23 0732 06/18/23 1151 06/18/23 1540 06/18/23 2136 06/19/23 0728  GLUCAP 119* 167* 160* 132* 140*   Past Medical History:  She,  has  a past medical history of Arthritis, CAD (coronary artery disease), Chronic lower back pain, Diverticulosis, ESRD on dialysis (HCC), GERD (gastroesophageal reflux disease), Hiatal hernia, History of echocardiogram, Hyperlipidemia, Hypertension, Morbid obesity (HCC), Obesity, Persistent atrial fibrillation (HCC), Pneumonia, and Type II diabetes mellitus (HCC).   Surgical History:   Past Surgical History:  Procedure Laterality Date   ABDOMINAL HYSTERECTOMY     AV FISTULA PLACEMENT Left 01/27/2014   Procedure: ARTERIOVENOUS (AV) FISTULA CREATION;  Surgeon: Pryor Ochoa, MD;  Location: [facility] OR;  Service: Vascular;  Laterality: Left;   AV FISTULA REPAIR     "had it cleaned out"   CARDIAC CATHETERIZATION  ?1980s   CARDIAC CATHETERIZATION N/A 12/04/2016   Procedure: Left Heart Cath Angiography;  Surgeon: Kathleene Hazel, MD;  Location: [facility] INVASIVE CV LAB;  Service: Cardiovascular;  Laterality: N/A;   CARDIAC CATHETERIZATION N/A 12/04/2016   Procedure: Coronary Stent Intervention;  Surgeon: Kathleene Hazel, MD;  Location: [facility] INVASIVE CV LAB;  Service: Cardiovascular;  Laterality: N/A;  Mid RCA   CATARACT EXTRACTION W/ INTRAOCULAR LENS IMPLANT Left    COLONOSCOPY W/ BIOPSIES AND POLYPECTOMY     CORONARY ANGIOPLASTY WITH STENT PLACEMENT  12/04/2016   ESOPHAGOGASTRODUODENOSCOPY     INSERTION OF DIALYSIS CATHETER N/A 01/19/2014   Procedure: INSERTION OF DIALYSIS CATHETER;  Surgeon: Larina Earthly, MD;  Location: [facility] OR;  Service: Vascular;  Laterality: N/A;   RIGHT/LEFT HEART CATH AND CORONARY ANGIOGRAPHY N/A 06/11/2023   Procedure: RIGHT/LEFT HEART CATH AND CORONARY ANGIOGRAPHY;  Surgeon: Yvonne Kendall, MD;  Location: [facility] INVASIVE CV LAB;  Service: Cardiovascular;  Laterality: N/A;    Social History:   reports that she quit smoking about 39 years ago. Her smoking use included cigarettes. She started smoking about 59 years ago. She has a 20 pack-year smoking history. She has never used  smokeless tobacco. She reports that she does not drink alcohol and does not use drugs.   Family History:  Her family history includes Breast cancer in her sister; CAD in her father and sister; Cancer in her brother; Diabetes in her mother; Heart attack in her father; Heart disease in her father; Hypertension in her father; Peripheral vascular disease in her mother.   Allergies Allergies  Allergen Reactions   Sulfa Antibiotics Other (See Comments)   Sulfa Drugs Cross Reactors Other (See Comments)    Doesn't remember     Home Medications  Prior to Admission medications   Medication Sig Start Date End Date Taking? Authorizing Provider  acetaminophen (TYLENOL) 650 MG CR tablet Take 1,300 mg by mouth every 8 (eight) hours as needed for pain.    [provider]  albuterol (VENTOLIN HFA) 108 (90 Base) MCG/ACT inhaler Inhale 1-2 puffs into the lungs every 6 (six) hours as needed for wheezing or shortness of breath. 12/15/21   Franne Forts, DO  apixaban (ELIQUIS) 5 MG TABS tablet Take 1 tablet (5 mg total) by mouth 2 (two) times daily. 10/02/22   Tresa Moore, MD  B Complex-C-Zn-Folic Acid (DIALYVITE/ZINC) TABS Take 1 tablet by mouth daily.    [provider]  bisacodyl (DULCOLAX) 5 MG EC tablet Take 2 tablets (10 mg total) by mouth daily as needed for moderate constipation. 10/02/22   Tresa Moore, MD  cinacalcet (SENSIPAR) 30 MG tablet Take 30 mg by mouth daily with supper.    [provider]  ezetimibe (ZETIA) 10 MG tablet Take 1 tablet (10 mg total) by mouth  daily. 04/30/22   Iran Ouch, MD  ferric citrate (AURYXIA) 1 GM 210 MG(Fe) tablet Take 420 mg by mouth 3 (three) times daily with meals.    [provider]  fexofenadine (ALLEGRA) 180 MG tablet Take 180 mg by mouth daily.    [provider]  midodrine (PROAMATINE) 10 MG tablet Take 20 mg by mouth daily. (Take additional two tablets [20mg ] for administration at dialysis on Tuesday,  Thursday and Saturday)    [provider]  midodrine (PROAMATINE) 10 MG tablet Take 20 mg by mouth daily as needed (for SBP <100).    [provider]  Omega-3 Fatty Acids (FISH OIL) 1000 MG CPDR Take 1,000 mg by mouth daily.    [provider]  omeprazole (PRILOSEC) 20 MG capsule Take 1 capsule (20 mg total) by mouth daily. Patient taking differently: Take 20 mg by mouth 2 (two) times daily. 02/21/22   Arnette Felts, FNP  ondansetron (ZOFRAN-ODT) 4 MG disintegrating tablet Take 4 mg by mouth every 8 (eight) hours as needed for nausea or vomiting.    [provider]  polyethylene glycol (MIRALAX / GLYCOLAX) 17 g packet Take 17 g by mouth at bedtime.    [provider]  pregabalin (LYRICA) 100 MG capsule Take 100 mg by mouth at bedtime.    [provider]  pregabalin (LYRICA) 50 MG capsule Take 50 mg by mouth 2 (two) times daily.    [provider]  rosuvastatin (CRESTOR) 10 MG tablet Take 1 tablet (10 mg total) by mouth daily. 10/03/22   Tresa Moore, MD  sennosides-docusate sodium (SENOKOT-S) 8.6-50 MG tablet Take 1 tablet by mouth 2 (two) times daily.    [provider]  trolamine salicylate (ASPERCREME) 10 % cream Apply 1 Application topically 2 (two) times daily. (Apply to knees)    [provider]    Critical care time:   Critical care provider statement:   Total critical care time: 33 minutes   Performed by: Karna Christmas MD   Critical care time was exclusive of separately billable procedures and treating other patients.   Critical care was necessary to treat or prevent imminent or life-threatening deterioration.   Critical care was time spent personally by me on the following activities: development of treatment plan with patient and/or surrogate as well as nursing, discussions with consultants, evaluation of patient's response to treatment, examination of patient, obtaining history from patient or  surrogate, ordering and performing treatments and interventions, ordering and review of laboratory studies, ordering and review of radiographic studies, pulse oximetry and re-evaluation of patient's condition.    Vida Rigger, M.D.  Pulmonary & Critical Care Medicine

## 2023-06-20 DIAGNOSIS — Z7189 Other specified counseling: Secondary | ICD-10-CM | POA: Diagnosis not present

## 2023-06-20 DIAGNOSIS — R001 Bradycardia, unspecified: Secondary | ICD-10-CM | POA: Diagnosis not present

## 2023-06-20 DIAGNOSIS — I959 Hypotension, unspecified: Secondary | ICD-10-CM | POA: Diagnosis not present

## 2023-06-20 DIAGNOSIS — N186 End stage renal disease: Secondary | ICD-10-CM | POA: Diagnosis not present

## 2023-06-20 LAB — CBC
HCT: 29.8 % — ABNORMAL LOW (ref 36.0–46.0)
Hemoglobin: 9.2 g/dL — ABNORMAL LOW (ref 12.0–15.0)
MCH: 31.2 pg (ref 26.0–34.0)
MCHC: 30.9 g/dL (ref 30.0–36.0)
MCV: 101 fL — ABNORMAL HIGH (ref 80.0–100.0)
Platelets: 142 10*3/uL — ABNORMAL LOW (ref 150–400)
RBC: 2.95 MIL/uL — ABNORMAL LOW (ref 3.87–5.11)
RDW: 16 % — ABNORMAL HIGH (ref 11.5–15.5)
WBC: 9.4 10*3/uL (ref 4.0–10.5)
nRBC: 0.2 % (ref 0.0–0.2)

## 2023-06-20 LAB — RENAL FUNCTION PANEL
Albumin: 3.3 g/dL — ABNORMAL LOW (ref 3.5–5.0)
Anion gap: 10 (ref 5–15)
BUN: 51 mg/dL — ABNORMAL HIGH (ref 8–23)
CO2: 27 mmol/L (ref 22–32)
Calcium: 8.1 mg/dL — ABNORMAL LOW (ref 8.9–10.3)
Chloride: 96 mmol/L — ABNORMAL LOW (ref 98–111)
Creatinine, Ser: 5.1 mg/dL — ABNORMAL HIGH (ref 0.44–1.00)
GFR, Estimated: 8 mL/min — ABNORMAL LOW (ref >60)
Glucose, Bld: 138 mg/dL — ABNORMAL HIGH (ref 70–99)
Phosphorus: 3.4 mg/dL (ref 2.5–4.6)
Potassium: 4.1 mmol/L (ref 3.5–5.1)
Sodium: 133 mmol/L — ABNORMAL LOW (ref 135–145)

## 2023-06-20 LAB — HEPARIN LEVEL (UNFRACTIONATED): Heparin Unfractionated: 0.32 IU/mL (ref 0.30–0.70)

## 2023-06-20 LAB — GLUCOSE, CAPILLARY
Glucose-Capillary: 110 mg/dL — ABNORMAL HIGH (ref 70–99)
Glucose-Capillary: 129 mg/dL — ABNORMAL HIGH (ref 70–99)
Glucose-Capillary: 144 mg/dL — ABNORMAL HIGH (ref 70–99)
Glucose-Capillary: 103 mg/dL — ABNORMAL HIGH (ref 70–99)

## 2023-06-20 MED ORDER — POLYVINYL ALCOHOL 1.4 % OP SOLN
1.0000 [drp] | OPHTHALMIC | Status: DC | PRN
  Administered 2023-06-20 – 2023-06-21 (×2): 1 [drp] via OPHTHALMIC
  Filled 2023-06-20: qty 15

## 2023-06-20 MED ORDER — APIXABAN 5 MG PO TABS
5.0000 mg | ORAL_TABLET | Freq: Two times a day (BID) | ORAL | Status: DC
  Administered 2023-06-20 – 2023-06-30 (×20): 5 mg via ORAL
  Filled 2023-06-20 (×20): qty 1

## 2023-06-20 MED ORDER — EPOETIN ALFA 10000 UNIT/ML IJ SOLN
10000.0000 [IU] | INTRAMUSCULAR | Status: DC
  Administered 2023-06-21 – 2023-06-24 (×2): 10000 [IU] via INTRAVENOUS
  Filled 2023-06-20 (×2): qty 1

## 2023-06-20 NOTE — Progress Notes (Signed)
Occupational Therapy Treatment Patient Details Name: Jacqueline [name] MRN: [id number] DOB: 1946-09-20 Today's Date: 06/20/2023   History of present illness 77 y.o female with significant PMHx significant for HTN, T2DM, ESRD on HD TTS, Chronic debility, CAD s/p stent, PAF, OSA, PAD, Left knee arthritis, Chronic lower back pain, GERD, Obesity, HLD who presented to the ED from Dialysis with generalized weakness x 4 days.   OT comments  Jacqueline [name] was seen for OT/PT co-treatment on this date. Upon arrival to room pt reclined in bed, agreeable to tx. Pt requires MIN A x2 + R platform RW for bed>BSC>chair step pivot t/f, MAX A pericare in standing. BP monitored t/o session, pt endorses dizziness in initial sitting, resolves with time. Pt making good progress toward goals, will continue to follow POC. Discharge recommendation remains appropriate.     Recommendations for follow up therapy are one component of a multi-disciplinary discharge planning process, led by the attending physician.  Recommendations may be updated based on patient status, additional functional criteria and insurance authorization.    Assistance Recommended at Discharge Intermittent Supervision/Assistance  Patient can return home with the following  A little help with walking and/or transfers;A lot of help with bathing/dressing/bathroom;Assist for transportation   Equipment Recommendations  Other (comment) (defer)    Recommendations for Other Services      Precautions / Restrictions Precautions Precautions: Fall Restrictions Weight Bearing Restrictions: No       Mobility Bed Mobility Overal bed mobility: Needs Assistance Bed Mobility: Supine to Sit     Supine to sit: Min assist          Transfers Overall transfer level: Needs assistance Equipment used: Right platform walker Transfers: Sit to/from Stand, Bed to chair/wheelchair/BSC Sit to Stand: Min assist, +2 physical assistance, +2 safety/equipment      Step pivot transfers: Min assist, +2 physical assistance, +2 safety/equipment           Balance Overall balance assessment: Needs assistance Sitting-balance support: Feet supported Sitting balance-Leahy Scale: Fair     Standing balance support: Bilateral upper extremity supported, During functional activity, Reliant on assistive device for balance Standing balance-Leahy Scale: Poor                             ADL either performed or assessed with clinical judgement   ADL Overall ADL's : Needs assistance/impaired                                       General ADL Comments: MIN A x2 + R platform RW for BSC t/f, MAX A pericare in standing      Cognition Arousal/Alertness: Awake/alert Behavior During Therapy: WFL for tasks assessed/performed Overall Cognitive Status: Within Functional Limits for tasks assessed                                                     Pertinent Vitals/ Pain       Pain Assessment Pain Assessment: Faces Faces Pain Scale: Hurts a little bit Pain Location: R leg Pain Descriptors / Indicators: Discomfort, Dull Pain Intervention(s): Limited activity within patient's tolerance, Repositioned   Frequency  Min 1X/week        Progress Toward Goals  OT Goals(current goals can now be found in the care plan section)  Progress towards OT goals: Progressing toward goals  Acute Rehab OT Goals Patient Stated Goal: to feel better OT Goal Formulation: With patient Time For Goal Achievement: 07/02/23 Potential to Achieve Goals: Fair ADL Goals Pt Will Perform Grooming: with set-up;sitting Pt Will Transfer to Toilet: with modified independence;bedside commode;ambulating Pt Will Perform Toileting - Clothing Manipulation and hygiene: with modified independence;sit to/from stand  Plan Discharge plan remains appropriate;Frequency remains appropriate    Co-evaluation    PT/OT/SLP  Co-Evaluation/Treatment: Yes Reason for Co-Treatment: For patient/therapist safety;To address functional/ADL transfers PT goals addressed during session: Mobility/safety with mobility;Balance OT goals addressed during session: ADL's and self-care;Proper use of Adaptive equipment and DME      AM-PAC OT "6 Clicks" Daily Activity     Outcome Measure   Help from another person eating meals?: None Help from another person taking care of personal grooming?: A Little Help from another person toileting, which includes using toliet, bedpan, or urinal?: A Lot Help from another person bathing (including washing, rinsing, drying)?: A Lot Help from another person to put on and taking off regular upper body clothing?: A Lot Help from another person to put on and taking off regular lower body clothing?: A Lot 6 Click Score: 15    End of Session    OT Visit Diagnosis: Unsteadiness on feet (R26.81)   Activity Tolerance Patient tolerated treatment well   Patient Left in chair;with call bell/phone within reach   Nurse Communication Mobility status        Time: 3244-0102 OT Time Calculation (min): 28 min  Charges: OT General Charges $OT Visit: 1 Visit OT Treatments $Self Care/Home Management : 8-22 mins  Kathie Dike, M.S. OTR/L  06/20/23, 11:54 AM  ascom [phone]

## 2023-06-20 NOTE — Progress Notes (Signed)
Daily Progress Note   Patient Name: Jacqueline [name]       Date: 06/20/2023 DOB: 1946-03-17  Age: 77 y.o. MRN#: [id number] Attending Physician: Lurene Shadow, MD Primary Care Physician: Macky Lower, MD Admit Date: 06/07/2023  Reason for Consultation/Follow-up: Establishing goals of care  Subjective: Notes and labs reviewed. In to see patient. She is sitting in bedside chair today watching t.v. No complaints at this time. She states she feels dialysis is going well.   PMT will shadow for decline as goals are set for full code and full scope as long as patient is able to tolerate dialysis.   Length of Stay: 13  Current Medications: Scheduled Meds:   Chlorhexidine Gluconate Cloth  6 each Topical QHS   droxidopa  100 mg Oral TID WC   ezetimibe  10 mg Oral Daily   feeding supplement (NEPRO CARB STEADY)  237 mL Oral TID BM   fludrocortisone  0.2 mg Oral BID   insulin aspart  0-20 Units Subcutaneous TID WC   insulin aspart  0-5 Units Subcutaneous QHS   insulin glargine-yfgn  5 Units Subcutaneous Daily   loratadine  10 mg Oral Daily   midodrine  10 mg Oral TID WC   multivitamin  1 tablet Oral QHS   pregabalin  100 mg Oral QHS   pregabalin  50 mg Oral BID   rosuvastatin  10 mg Oral Daily   senna-docusate  1 tablet Oral BID   sodium chloride flush  3 mL Intravenous Q12H    Continuous Infusions:  sodium chloride Stopped (06/10/23 1900)   sodium chloride     anticoagulant sodium citrate     heparin 1,350 Units/hr (06/20/23 0423)    PRN Meds: sodium chloride, acetaminophen, alteplase, alum & mag hydroxide-simeth, anticoagulant sodium citrate, bisacodyl, docusate sodium, guaiFENesin-dextromethorphan, heparin, HYDROcodone-acetaminophen, lidocaine (PF), lidocaine-prilocaine, morphine  injection, nitroGLYCERIN, ondansetron (ZOFRAN) IV, mouth rinse, pentafluoroprop-tetrafluoroeth, polyethylene glycol, sodium chloride flush  Physical Exam Pulmonary:     Effort: Pulmonary effort is normal.  Neurological:     Mental Status: She is alert.             Vital Signs: BP (!) 83/50   Pulse (!) 58   Temp 98.3 F (36.8 C) (Oral)   Resp 20   Ht 5\' 4"  (1.626 m)  Wt 120.2 kg   SpO2 100%   BMI 45.49 kg/m  SpO2: SpO2: 100 % O2 Device: O2 Device: Nasal Cannula O2 Flow Rate: O2 Flow Rate (L/min): 2 L/min  Intake/output summary:  Intake/Output Summary (Last 24 hours) at 06/20/2023 1353 Last data filed at 06/20/2023 0336 Gross per 24 hour  Intake 593 ml  Output 0 ml  Net 593 ml   LBM: Last BM Date : 06/19/23 Baseline Weight: Weight: 114.8 kg Most recent weight: Weight: 120.2 kg   Patient Active Problem List   Diagnosis Date Noted   Goals of care, counseling/discussion 06/12/2023   Severe pulmonary hypertension (HCC) 06/10/2023   Morbid obesity (HCC) 06/08/2023   Symptomatic bradycardia 06/07/2023   Hyperkalemia 09/30/2022   Constipation 09/30/2022   AMS (altered mental status) 09/28/2022   Hypotension    Permanent atrial fibrillation (HCC)    Pressure injury of skin 05/22/2022   General weakness 05/22/2022   Weakness 05/21/2022   Anaphylactic shock, unspecified, initial encounter 06/29/2020   Sepsis (HCC) 06/22/2019   Pain due to onychomycosis of toenails of both feet 06/11/2019   Callus 06/11/2019   Other disorders of phosphorus metabolism 05/20/2019   Mixed hyperlipidemia 03/22/2019   Otalgia of both ears 03/22/2019   Right arm pain 03/22/2019   Bradycardia 10/01/2017   Dyslipidemia 10/01/2017   Dependence on renal dialysis (HCC) 09/01/2017   Chest pain, unspecified 12/06/2016   Non-ST elevation (NSTEMI) myocardial infarction (HCC) 12/06/2016   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   History of NSTEMI    CAD (coronary artery disease) 12/03/2016    Hypokalemia 05/03/2016   Pruritus, unspecified 03/27/2015   Encounter for removal of sutures 11/30/2014   Pain in lower limb 08/26/2014   Encounter for immunization 06/10/2014   Unspecified protein-calorie malnutrition (HCC) 02/28/2014   Allergy, unspecified, initial encounter 02/15/2014   HCAP (healthcare-associated pneumonia) 02/13/2014   Absence of both cervix and uterus, acquired 02/01/2014   Coagulation defect, unspecified (HCC) 02/01/2014   Dyspnea, unspecified 02/01/2014   Family history of diabetes mellitus 02/01/2014   Family history of ischemic heart disease and other diseases of the circulatory system 02/01/2014   Fever, unspecified 02/01/2014   Headache, unspecified 02/01/2014   History of allergy to sulfonamides 02/01/2014   Iron deficiency anemia, unspecified 02/01/2014   Long term (current) use of insulin (HCC) 02/01/2014   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   OSA (obstructive sleep apnea) 01/21/2014   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HCC) 01/21/2014   Acute encephalopathy 01/19/2014   Congestive heart failure (HCC) 01/15/2014   End stage renal disease (HCC) 01/15/2014   Hypertension 01/15/2014   Diabetes mellitus (HCC) 01/15/2014   Anemia 01/15/2014   Onychomycosis 03/26/2013   Pain in joint, ankle and foot 03/26/2013    Palliative Care Assessment & Plan   Recommendations/Plan: PMT will shadow for decline.  Goals set for full code/ full scope.     Code Status:    Code Status Orders  (From admission, onward)           Start     Ordered   06/11/23 1358  Full code  Continuous       Question:  By:  Answer:  Consent: discussion documented in EHR   06/11/23 1358           Code Status History     Date Active Date Inactive Code Status Order ID Comments User Context   06/07/2023 1926 06/11/2023 1358 Full Code [id number]  Jimmye Norman, NP ED   09/28/2022 1410  10/02/2022 1618 Full Code [id number]  Lucile Shutters, MD ED    05/22/2022 0052 05/30/2022 2329 Full Code [id number]  Lurline Del, MD ED   06/22/2019 1441 06/24/2019 1328 Full Code [id number]  Carron Curie, MD Inpatient   12/03/2016 1641 12/05/2016 1631 Full Code [id number]  Calvert Cantor, MD ED   01/15/2014 2031 01/28/2014 2059 Full Code [id number]  Ron Parker, MD ED   01/07/2012 0100 01/07/2012 1448 Full Code [id number]  Doug Sou, MD ED      Advance Directive Documentation    Flowsheet Row Most Recent Value  Type of Advance Directive Healthcare Power of Attorney  Pre-existing out of facility DNR order (yellow form or pink MOST form) --  "MOST" Form in Place? --       Prognosis:  Unable to determine    Thank you for allowing the Palliative Medicine Team to assist in the care of this patient.    Morton Stall, NP  Please contact Palliative Medicine Team phone at [phone] for questions and concerns.

## 2023-06-20 NOTE — TOC Progression Note (Signed)
Transition of Care [facility]) - Progression Note    Patient Details  Name: Jacqueline [name] MRN: [id number] Date of Birth: January 21, 1946  [facility] CM/SW Contact  Kreg Shropshire, RN Phone Number: 06/20/2023, 11:15 AM  Clinical Narrative:    Cm continue to follow for needs. She is a resident of [facility]. Has PT/OT rec for Short term rehab. FL2 completed.   Expected Discharge Plan: Long Term Nursing Home Barriers to Discharge: Continued Medical Work up  Expected Discharge Plan and Services       Living arrangements for the past 2 months: Skilled Nursing Facility Expected Discharge Date: 06/10/23                                     Social Determinants of Health (SDOH) Interventions SDOH Screenings   Food Insecurity: No Food Insecurity (06/07/2023)  Housing: Low Risk  (06/07/2023)  Transportation Needs: No Transportation Needs (06/07/2023)  Utilities: Not At Risk (06/07/2023)  Depression (PHQ2-9): Low Risk  (06/27/2021)  Financial Resource Strain: Medium Risk (06/27/2021)  Physical Activity: Inactive (06/27/2021)  Stress: No Stress Concern Present (06/27/2021)  Tobacco Use: Medium Risk (06/07/2023)    Readmission Risk Interventions    05/22/2022   12:26 PM  Readmission Risk Prevention Plan  Transportation Screening Complete  PCP or Specialist Appt within 3-5 Days Complete  HRI or Home Care Consult Complete  Social Work Consult for Recovery Care Planning/Counseling Complete  Palliative Care Screening Complete  Medication Review Oceanographer) Complete

## 2023-06-20 NOTE — Progress Notes (Signed)
[facility]  ROUNDING NOTE   Subjective:   A line to be taken out today.   Seen and examined today while working with physical therapy.   Hemodialysis treatment yesterday. Tolerated treatment well. UF of 1.5 liters  Objective:  Vital signs in last 24 hours:  Temp:  [98 F (36.7 C)-98.3 F (36.8 C)] 98.3 F (36.8 C) (07/19 1200) Pulse Rate:  [42-66] 58 (07/19 1300) Resp:  [14-20] 20 (07/19 1300) SpO2:  [92 %-100 %] 100 % (07/19 1300) Arterial Line BP: (81-125)/(26-60) 111/46 (07/19 1300) Weight:  [120.2 kg] 120.2 kg (07/19 0500)  Weight change: 0.5 kg Filed Weights   06/19/23 0749 06/19/23 1201 06/20/23 0500  Weight: 122.1 kg 120.6 kg 120.2 kg    Intake/Output: I/O last 3 completed shifts: In: 1263.7 [P.O.:1105; I.V.:158.7] Out: 1500 [Other:1500]   Intake/Output this shift:  No intake/output data recorded.  Physical Exam: General: NAD, laying in bed  Head: Normocephalic, atraumatic. Moist oral mucosal membranes  Eyes: Anicteric  Lungs:  Clear to auscultation, normal effort  Heart: Regular rate and rhythm  Abdomen:  Soft, nontender  Extremities: no peripheral edema.  Neurologic: Alert and oriented, moving all four extremities  Skin: No lesions  Access: Lt AVF    Basic Metabolic Panel: Recent Labs  Lab 06/15/23 0438 06/16/23 0414 06/17/23 0508 06/18/23 0400 06/19/23 0340 06/20/23 0530  NA 132* 130* 129* 131* 129* 133*  K 3.8 4.5 4.6 4.1 4.3 4.1  CL 96* 94* 93* 96* 95* 96*  CO2 27 27 24 23  21* 27  GLUCOSE 206* 132* 136* 112* 167* 138*  BUN 38* 57* 78* 51* 75* 51*  CREATININE 4.10* 5.64* 7.25* 5.00* 6.55* 5.10*  CALCIUM 7.8* 7.8* 7.8* 7.9* 7.7* 8.1*  MG 1.8 1.8  --  1.9 1.9  --   PHOS 2.2* 3.9 4.6 3.6 4.4 3.4    Liver Function Tests: Recent Labs  Lab 06/16/23 0414 06/17/23 0508 06/18/23 0400 06/19/23 0340 06/20/23 0530  ALBUMIN 3.1* 2.9* 3.2* 3.3* 3.3*   No results for input(s): "LIPASE", "AMYLASE" in the last 168 hours.  No  results for input(s): "AMMONIA" in the last 168 hours.  CBC: Recent Labs  Lab 06/16/23 0414 06/17/23 0508 06/18/23 0400 06/19/23 0340 06/20/23 0530  WBC 12.0* 10.8* 9.5 11.2* 9.4  HGB 9.7* 9.2* 9.1* 9.4* 9.2*  HCT 30.4* 29.1* 29.0* 29.9* 29.8*  MCV 99.0 97.0 100.3* 100.3* 101.0*  PLT 153 145* 141* 154 142*    Cardiac Enzymes: No results for input(s): "CKTOTAL", "CKMB", "CKMBINDEX", "TROPONINI" in the last 168 hours.  BNP: Invalid input(s): "POCBNP"  CBG: Recent Labs  Lab 06/19/23 1624 06/19/23 1920 06/19/23 2331 06/20/23 0728 06/20/23 1133  GLUCAP 142* 108* 113* 110* 129*    Microbiology: Results for orders placed or performed during the hospital encounter of 06/07/23  Blood culture (routine x 2)     Status: None   Collection Time: 06/07/23 12:54 PM   Specimen: BLOOD  Result Value Ref Range Status   Specimen Description BLOOD RIGHT ANTECUBITAL  Final   Special Requests   Final    BOTTLES DRAWN AEROBIC AND ANAEROBIC Blood Culture adequate volume   Culture   Final    NO GROWTH 5 DAYS Performed at [facility], [address] [place], [place] [address]    Report Status 06/12/2023 FINAL  Final  Blood culture (routine x 2)     Status: None   Collection Time: 06/07/23 12:54 PM   Specimen: BLOOD  Result Value  Ref Range Status   Specimen Description BLOOD BLOOD RIGHT FOREARM  Final   Special Requests   Final    BOTTLES DRAWN AEROBIC AND ANAEROBIC Blood Culture adequate volume   Culture   Final    NO GROWTH 5 DAYS Performed at [facility], [address] [place], [place] [address]    Report Status 06/12/2023 FINAL  Final  MRSA Next Gen by PCR, Nasal     Status: None   Collection Time: 06/07/23 10:18 PM   Specimen: Nasal Mucosa; Nasal Swab  Result Value Ref Range Status   MRSA by PCR Next Gen NOT DETECTED NOT DETECTED Final    Comment: (NOTE) The GeneXpert MRSA Assay (FDA approved for NASAL specimens only), is one component of a  comprehensive MRSA colonization surveillance program. It is not intended to diagnose MRSA infection nor to guide or monitor treatment for MRSA infections. Test performance is not FDA approved in patients less than 58 years old. Performed at [facility], [address] [place], [place] [address]     Coagulation Studies: No results for input(s): "LABPROT", "INR" in the last 72 hours.  Urinalysis: No results for input(s): "COLORURINE", "LABSPEC", "PHURINE", "GLUCOSEU", "HGBUR", "BILIRUBINUR", "KETONESUR", "PROTEINUR", "UROBILINOGEN", "NITRITE", "LEUKOCYTESUR" in the last 72 hours.  Invalid input(s): "APPERANCEUR"    Imaging: No results found.   Medications:    sodium chloride Stopped (06/10/23 1900)   sodium chloride     anticoagulant sodium citrate     heparin 1,350 Units/hr (06/20/23 0423)    Chlorhexidine Gluconate Cloth  6 each Topical QHS   droxidopa  100 mg Oral TID WC   ezetimibe  10 mg Oral Daily   feeding supplement (NEPRO CARB STEADY)  237 mL Oral TID BM   fludrocortisone  0.2 mg Oral BID   insulin aspart  0-20 Units Subcutaneous TID WC   insulin aspart  0-5 Units Subcutaneous QHS   insulin glargine-yfgn  5 Units Subcutaneous Daily   loratadine  10 mg Oral Daily   midodrine  10 mg Oral TID WC   multivitamin  1 tablet Oral QHS   pregabalin  100 mg Oral QHS   pregabalin  50 mg Oral BID   rosuvastatin  10 mg Oral Daily   senna-docusate  1 tablet Oral BID   sodium chloride flush  3 mL Intravenous Q12H   sodium chloride, acetaminophen, alteplase, alum & mag hydroxide-simeth, anticoagulant sodium citrate, bisacodyl, docusate sodium, guaiFENesin-dextromethorphan, heparin, HYDROcodone-acetaminophen, lidocaine (PF), lidocaine-prilocaine, morphine injection, nitroGLYCERIN, ondansetron (ZOFRAN) IV, mouth rinse, pentafluoroprop-tetrafluoroeth, polyethylene glycol, sodium chloride flush  Assessment/ Plan:  [name] is a 77 y.o.  female  with a PMHx  of ESRD on HD TTS at [facility], diabetes mellitus type 2, chronic hypotension, peripheral arterial disease, obstructive sleep apnea, GERD, obesity, hyperlipidemia, anemia chronic kidney disease, secondary hyperparathyroidism, who was admitted to [facility] on 06/07/2023 for evaluation of generalized weakness.   Found to have severe hypotension, pulmonary hypertension, atrial fibrillation and tricuspid valve regurgitation all contributing to hypotension.   1  ESRD: on hemodialysis  - Continue  TTS schedule.  - can handle UF of 2 liters per treatment.   2.  Hypotension with severe pulmonary hypertension..  Chronic in nature.  Systolic blood pressure usually in the 80s as an outpatient.   - midodrine 10 mg 3 times daily as outpatient. Higher dose caused symptomatic bradycardia on dialysis treatment.  - fludrocortisone - droxidopa  - No pressors  - appreciate critical care  and cardiology assistance.   3. Anemia of chronic kidney disease Lab Results  Component Value Date   HGB 9.2 (L) 06/20/2023  ESA with HD treatments - may help blood pressures.  Continue Mircera as an outpatient.  4. Diabetes mellitus type II with chronic kidney disease/renal manifestations: noninsulin dependent.  Hemoglobin A1c is 5.8 on 06/07/23.  - continue glucose control.   5. Secondary Hyperparathyroidism:    Lab Results  Component Value Date   PTH 388.6 (H) 01/17/2014   CALCIUM 8.1 (L) 06/20/2023   CAION 0.92 (L) 06/11/2023   PHOS 3.4 06/20/2023   - Holding Auryxia and Sensipar     LOS: 13 Shandrika Ambers 7/19/20241:51 PM

## 2023-06-20 NOTE — Progress Notes (Signed)
Physical Therapy Treatment Patient Details Name: Jacqueline [name] MRN: [id number] DOB: 1946/07/15 Today's Date: 06/20/2023   History of Present Illness 77 y.o female with significant PMHx significant for HTN, T2DM, ESRD on HD TTS, Chronic debility, CAD s/p stent, PAF, OSA, PAD, Left knee arthritis, Chronic lower back pain, GERD, Obesity, HLD who presented to the ED from Dialysis with generalized weakness x 4 days.    PT Comments  PT/OT co treat 2/2 to pt's limited activity tolerance. Pt is alert and oriented x 3. Poor insight of her deficits and prognosis. Pt was able to exit L side of bed, stand to RW, and take several steps to [facility]. Successful BM prior to stand pivot to recliner. Pt is severely limited by fatigue. She will continue to benefit from skilled PT to maximize her independence and safety with all ADLs.     Assistance Recommended at Discharge Frequent or constant Supervision/Assistance  If plan is discharge home, recommend the following:  Can travel by private vehicle    Two people to help with walking and/or transfers;A lot of help with bathing/dressing/bathroom;Assistance with cooking/housework;Direct supervision/assist for medications management;Direct supervision/assist for financial management;Assist for transportation;Help with stairs or ramp for entrance      Equipment Recommendations  None recommended by PT       Precautions / Restrictions Precautions Precautions: Fall Restrictions Weight Bearing Restrictions: No     Mobility  Bed Mobility Overal bed mobility: Needs Assistance Bed Mobility: Supine to Sit  Supine to sit: Min assist  General bed mobility comments: Increased time, HOB elevated, and step by step cueing for improved technique and sequencing.    Transfers Overall transfer level: Needs assistance Equipment used: Right platform walker (Artial line + IVs hurting pt's) Transfers: Sit to/from Stand Sit to Stand: Min assist, +2 safety/equipment   General transfer comment: Pt was able to stand form EOB to RW [employer]) with min assist. 2nd person for safety and management of time/monitors. PT took several steps to [facility]. She had successful BM prior to pivoting to recliner. Overall is limited by fatigue.    Ambulation/Gait Ambulation/Gait assistance: Min assist Gait Distance (Feet): 3 Feet Assistive device: Rolling walker (2 wheels) Gait Pattern/deviations: Step-to pattern Gait velocity: decreased  General Gait Details: Pt took a few slow cautious step  from EOB to recliner     Balance Overall balance assessment: Needs assistance Sitting-balance support: Feet supported Sitting balance-Leahy Scale: Fair     Standing balance support: Bilateral upper extremity supported, During functional activity, Reliant on assistive device for balance Standing balance-Leahy Scale: Poor       Cognition Arousal/Alertness: Awake/alert Behavior During Therapy: WFL for tasks assessed/performed Overall Cognitive Status: Within Functional Limits for tasks assessed    General Comments: Pt is A and agreeable. Does present with somewhat flat affect               Pertinent Vitals/Pain Pain Assessment Pain Assessment: 0-10 Pain Score: 3  Pain Location: R leg Pain Descriptors / Indicators: Discomfort, Dull Pain Intervention(s): Limited activity within patient's tolerance, Repositioned     PT Goals (current goals can now be found in the care plan section) Acute Rehab PT Goals Patient Stated Goal: none stated Progress towards PT goals: Progressing toward goals    Frequency    Min 1X/week      PT Plan Current plan remains appropriate    Co-evaluation   Reason for Co-Treatment: For patient/therapist safety PT goals addressed during session: Mobility/safety with mobility;Balance;Proper use of DME;Strengthening/ROM  AM-PAC PT "6 Clicks" Mobility   Outcome Measure  Help needed turning from your back to your side while  in a flat bed without using bedrails?: A Lot Help needed moving from lying on your back to sitting on the side of a flat bed without using bedrails?: A Lot Help needed moving to and from a bed to a chair (including a wheelchair)?: A Lot Help needed standing up from a chair using your arms (e.g., wheelchair or bedside chair)?: A Lot Help needed to walk in hospital room?: A Lot Help needed climbing 3-5 steps with a railing? : Total 6 Click Score: 11    End of Session Equipment Utilized During Treatment: Gait belt Activity Tolerance: Patient limited by fatigue Patient left: in chair;with call bell/phone within reach Nurse Communication: Mobility status PT Visit Diagnosis: Other abnormalities of gait and mobility (R26.89);Muscle weakness (generalized) (M62.81);Difficulty in walking, not elsewhere classified (R26.2);Unsteadiness on feet (R26.81)     Time: 1610-9604 PT Time Calculation (min) (ACUTE ONLY): 28 min  Charges:    $Therapeutic Activity: 8-22 mins PT General Charges $$ ACUTE PT VISIT: 1 Visit                     Jetta Lout PTA 06/20/23, 2:51 PM

## 2023-06-20 NOTE — Plan of Care (Signed)

## 2023-06-20 NOTE — Progress Notes (Signed)
Patient Name: Jacqueline [name] Date of Encounter: 06/20/2023  Primary Cardiologist: Lorine Bears, MD Electrophysiologist: Sherryl Manges, MD  Interval Summary   EP asked to re-evaluate for appropriateness of pacemaker given ongoing difficulties tolerating dialysis d/t hypotension Pt sitting in chair, no complaints. Denies chest pain, palpitations, dizziness, presyncope, syncope.   She says her BP dropped yesterday during dialysis, but she was asleep the whole time.   Off norepi.   Inpatient Medications    Scheduled Meds:  Chlorhexidine Gluconate Cloth  6 each Topical QHS   droxidopa  100 mg Oral TID WC   ezetimibe  10 mg Oral Daily   feeding supplement (NEPRO CARB STEADY)  237 mL Oral TID BM   fludrocortisone  0.2 mg Oral BID   insulin aspart  0-20 Units Subcutaneous TID WC   insulin aspart  0-5 Units Subcutaneous QHS   insulin glargine-yfgn  5 Units Subcutaneous Daily   loratadine  10 mg Oral Daily   midodrine  10 mg Oral TID WC   multivitamin  1 tablet Oral QHS   pregabalin  100 mg Oral QHS   pregabalin  50 mg Oral BID   rosuvastatin  10 mg Oral Daily   senna-docusate  1 tablet Oral BID   sodium chloride flush  3 mL Intravenous Q12H   Continuous Infusions:  sodium chloride Stopped (06/10/23 1900)   sodium chloride     anticoagulant sodium citrate     heparin 1,350 Units/hr (06/20/23 0423)   PRN Meds: sodium chloride, acetaminophen, alteplase, alum & mag hydroxide-simeth, anticoagulant sodium citrate, bisacodyl, docusate sodium, guaiFENesin-dextromethorphan, heparin, HYDROcodone-acetaminophen, lidocaine (PF), lidocaine-prilocaine, morphine injection, nitroGLYCERIN, ondansetron (ZOFRAN) IV, mouth rinse, pentafluoroprop-tetrafluoroeth, polyethylene glycol, sodium chloride flush   Vital Signs    Vitals:   06/20/23 0300 06/20/23 0400 06/20/23 0500 06/20/23 0701  BP:      Pulse: (!) 48 (!) 49 (!) 52 (!) 49  Resp: 16 16 19 16   Temp:  98 F (36.7 C)  98.1 F (36.7 C)   TempSrc:  Axillary  Axillary  SpO2: 100% 100% 100% 100%  Weight:   120.2 kg   Height:        Intake/Output Summary (Last 24 hours) at 06/20/2023 1109 Last data filed at 06/20/2023 0336 Gross per 24 hour  Intake 833 ml  Output 1500 ml  Net -667 ml   Filed Weights   06/19/23 0749 06/19/23 1201 06/20/23 0500  Weight: 122.1 kg 120.6 kg 120.2 kg    Physical Exam    GEN- The patient is chronically ill-appearing, alert and oriented x 3 today.   Lungs- normal work of breathing, diminished in bases, difficult to assess d/t body habitus Cardiac- Irregularly irregular rate and rhythm, no murmurs, rubs or gallops GI- soft, NT, ND, + BS Extremities- no clubbing or cyanosis. Trace lower extremity edema  Telemetry    Afib, rates 60-80s; freq PVC (personally reviewed)  Hospital Course    Jacqueline [name] is a 77 y.o. female  with a history of CAD s/p PCI, ESRD on HD, HTN, HypoTN,  bradycardia, persis AFib, OSA admitted for bradycardia, hypotension. EP asked to re-consult for consideration of PPM given ongoing difficulties tolerating dialysis  Assessment & Plan    #) bradycardia  #) Hypotension #) pulmHTN #) CAD No AVN blocking agents Off norepi Continues to be hypotensive with HRs in 60s PPM unlikely to improve blood pressure, and thus ability to tolerate dialysis.  Consider palliative care for GOC discussion    #)  Persis AFib (?perm) CHA2DS2-VASc Score = 6 (HTN, T2DM, vasc dz, age x 2, gender)  Hep gtt per pharmacy Transition to eliquis prior to discharge  #) ESRD on HD Mgmt per nephrology team      For questions or updates, please contact [facility] Please consult [email or web address] for contact info under Cardiology/STEMI.  Signed, Sherie Don, NP  06/20/2023, 11:09 AM

## 2023-06-20 NOTE — Progress Notes (Signed)
   Patient Name: Jacqueline [name] Date of Encounter: 06/20/2023 [facility] Cardiologist: Lorine Bears, MD   Interval Summary  .    She feels fine except when she develops bradycardia during dialysis when she gets dizzy.  Vital Signs .    Vitals:   06/20/23 1000 06/20/23 1100 06/20/23 1200 06/20/23 1300  BP:      Pulse: (!) 58 66 (!) 55 (!) 58  Resp: 18 15 17 20   Temp:   98.3 F (36.8 C)   TempSrc:   Oral   SpO2: 100% 98% 100% 100%  Weight:      Height:        Intake/Output Summary (Last 24 hours) at 06/20/2023 1552 Last data filed at 06/20/2023 0336 Gross per 24 hour  Intake 593 ml  Output 0 ml  Net 593 ml      06/20/2023    5:00 AM 06/19/2023   12:01 PM 06/19/2023    7:49 AM  Last 3 Weights  Weight (lbs) 264 lb 15.9 oz 265 lb 14 oz 269 lb 2.9 oz  Weight (kg) 120.2 kg 120.6 kg 122.1 kg      Telemetry/ECG    Atrial fibrillation with slow ventricular response (rates mostly 45-70 bpm) - Personally Reviewed  Physical Exam .   GEN: No acute distress.   Neck: Unable to assess JVP due to body habitus. Cardiac: Distant heart sounds.  Bradycardic and irregularly. Respiratory: Mildly diminished breath sounds throughout. GI: Soft, nontender, non-distended  MS: Trace pretibial edema.  Assessment & Plan .     Hypotension and bradycardia: Relatively stable.   Vascular evaluation of HD fistula does not suggest high-flow state. -Continue current doses of midodrine (nephrology concerned that higher doses earlier this week may have worsened bradycardia), fludrocortisone, and droxidopa. -We asked EP to evaluate the patient again and it was felt that the pacemaker placement will not help the patient's condition which I agree with. Recommend removing the arterial line.  Severe pulmonary hypertension: This is likely exacerbating hypotension, as sudden changes in preload could drop her BP.  She is not a candidate for targeted PH therapies in the setting of her ESRD per  prior conversations with [facility].  Not a candidate for interventions on moderate-severe TR.  Persistent atrial fibrillation: Low HR persists. -Can switch heparin back to Eliquis which she was on as an outpatient.  CAD: No angina reported. -Continue secondary prevention.  Disposition: Prognosis remains poor.  However, the patient is not ready for hospice or comfort measures.  She wants to continue with dialysis as tolerated.    For questions or updates, please contact [facility] Please consult [email or web address] for contact info under [facility].    Signed, Lorine Bears, MD

## 2023-06-20 NOTE — Progress Notes (Signed)
Progress Note    Jacqueline [name]  JYN:[id number] DOB: [birth year]  DOA: 06/07/2023 PCP: Macky Lower, MD      Brief Narrative:    Medical records reviewed and are as summarized below:  Jacqueline [name] is a 77 y.o. female with PMHx significant for HTN, chronic hypotension, type II DM, ESRD on HD TTS, Chronic debility, CAD s/p stent, PAF, OSA, PAD, Left knee arthritis, Chronic lower back pain, GERD, Obesity, HLD who presented to the ED from the dialysis center with generalized weakness and hypotension with BP of 72/42.  Reportedly, patient had nausea, vomiting, diarrhea and left lower quadrant abdominal pain the night prior to admission.  ED Course:  Initial vital signs showed HR of 50 beats/minute, BP 81/51 mm Hg, the RR 16 breaths/minute, and the oxygen saturation 91% on 2L and a temperature of 97.61F (36.6C).   Pertinent Labs/Diagnostics.  EKG:?Afib vs junctional bradycardia rate 40bmp. Incomplete RBB,   CXR, CT scan obtained and show no signs of infection or other abnormality. Patient given, midodrine, atropine for significant bradycardia and  500 cc of fluids and started on broad-spectrum antibiotics Vanco cefepime and Flagyl for  suspected sepsis of unknown source. Patient remained hypotensive despite  intervention as above. PCCM consulted for possible pressors.    Significant Hospital Events: Including procedures, antibiotic start and stop dates in addition to other pertinent events   06/07/2023: admit to ICU with bradycardia, started on dopamine 06/08/2023: weaned off dopamine, hypotensive again, dopamine restarted 06/09/2023:  Remains hypotensive requiring Levophed, (pt is vasculopath, doubt accuracy of cuff BP as pt is awake, alert, and asymptomatic).  Attempted Arterial line but unsuccessful, Vascular Surgery consulted to assist with placement. Nephrology consulted, received HD. 06/10/2023: Continues to require Levophed (6 mcg currently), when Levo weaned down does exhibit  some Bradycardia in the upper 50's.  Cardiology considering R Heart Cath today. Holding on transfer to [facility] for pacer at this time. 06/11/2023: Levophed weaning down, this AM. LHC/RHC with mild-moderate non-obstructive CAD, patent proximal RCA stent, mild-moderately elevated L heart filling pressures, severely elevated R heart/pulm artery pressures, mildly reduced CO/CI. Episode of chest pressure with HD, resolved with morphine. 06/12/2023: Additional episodes of chest tightness/pressure overnight (0150 and 0554). EKG without changes, trops flat. Hypertensive to 227/80, then with NE off HR dropped to 29.  Droxidopa was eventually stopped. 06/13/2023: No events overnight, no chest pain or hypertension. Remains on Levophed.  Decrease Midodrine to 5 mg TID and restart Droxidopa, increase Florinef and stop Solu-cortef in attempts to wean Levophed off. Obtain CT Chest to rule out Pulmonary pathology for etiology of PH. 06/14/2023: chest pain yesterday. Re-started on levophed over night. CT shows no acute finding of ILD. Droxidopa re-started yesterday. 06/15/2023: completed dialysis yesterday. Re-started again on levophed.  06/16/23- met with cardiology to discuss treatment options.  At this time patient remains severely hypotensive on maximal medical management. Her Renal impairment and hypotension limit therapy.  Recommendation for palliative care at this time due to poor prognosis. 06/17/23- patient remains hypotensive with poor prognosis. She is requiring less vasopressor support.  06/18/23- patient is off vasopressors.  Still has overall very poor prognosis.  Recommendation for palliative/hospice.     Assessment/Plan:   Principal Problem:   Symptomatic bradycardia Active Problems:   Permanent atrial fibrillation (HCC)   End stage renal disease (HCC)   Bradycardia   Morbid obesity (HCC)   Severe pulmonary hypertension (HCC)   Goals of care, counseling/discussion  Body mass index is 45.49  kg/m.  (Morbid obesity)   Circulatory shock (suspected cardiogenic shock), chronic hypotension: Continue midodrine, fludrocortisone and droxidopa.  She was weaned off of IV Levophed drip on 06/16/2024.   Persistent atrial fibrillation, bradycardia: She was evaluated by EP cardiologist and she is deemed not to be a candidate for pacemaker at this time.  She is not on any rate control medications because of bradycardia. Discontinue IV heparin drip and restart Eliquis. Discussed with Dr. Kirke Corin, cardiologist.   CAD s/p RCA PCI, severe pulmonary hypertension, moderate to severe TR: S/p right and left heart cath on 06/11/2023 showed patent RCA stent, mid to moderate nonobstructive CAD, severely elevated right heart and pulmonary artery pressures.   ESRD on hemodialysis anemia of CKD: Follow-up with nephrologist for hemodialysis.   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 Hemoglobin A1c was 5.8 on 06/07/2023   Thrombocytopenia: fluctuating platelet count but overall improved.   Other comorbidities include chronic debility, chronic low back pain, left knee arthritis    Diet Order             Diet renal with fluid restriction Fluid restriction: 1200 mL Fluid; Room service appropriate? Yes; Fluid consistency: Thin  Diet effective now                            Consultants: Cardiologist Intensivist Nephrologist Palliative care  Procedures: None    Medications:    Chlorhexidine Gluconate Cloth  6 each Topical QHS   droxidopa  100 mg Oral TID WC   [START ON 06/21/2023] epoetin (EPOGEN/PROCRIT) injection  10,000 Units Intravenous Q T,Th,Sa-HD   ezetimibe  10 mg Oral Daily   feeding supplement (NEPRO CARB STEADY)  237 mL Oral TID BM   fludrocortisone  0.2 mg Oral BID   insulin aspart  0-20 Units Subcutaneous TID WC   insulin aspart  0-5 Units Subcutaneous  QHS   insulin glargine-yfgn  5 Units Subcutaneous Daily   loratadine  10 mg Oral Daily   midodrine  10 mg Oral TID WC   multivitamin  1 tablet Oral QHS   pregabalin  100 mg Oral QHS   pregabalin  50 mg Oral BID   rosuvastatin  10 mg Oral Daily   senna-docusate  1 tablet Oral BID   sodium chloride flush  3 mL Intravenous Q12H   Continuous Infusions:  sodium chloride Stopped (06/10/23 1900)   sodium chloride     anticoagulant sodium citrate       Anti-infectives (From admission, onward)    Start     Dose/Rate Route Frequency Ordered Stop   06/10/23 1200  vancomycin (VANCOCIN) IVPB 1000 mg/200 mL premix  Status:  Discontinued       Placed in "Followed by" Linked Group   1,000 mg 200 mL/hr over 60 Minutes Intravenous Every T-Th-Sa (Hemodialysis) 06/08/23 1058 06/09/23 1029   06/08/23 1145  vancomycin (VANCOCIN) IVPB 1000 mg/200 mL premix       Placed in "Followed by" Linked Group   1,000 mg 200 mL/hr over 60 Minutes Intravenous  Once 06/08/23 1058 06/08/23 1655   06/08/23 1100  piperacillin-tazobactam (ZOSYN) IVPB 2.25 g  Status:  Discontinued        2.25 g 100 mL/hr over 30 Minutes Intravenous Every 8 hours 06/08/23 1058 06/09/23 1029   06/07/23 1400  metroNIDAZOLE (FLAGYL) IVPB  500 mg        500 mg 100 mL/hr over 60 Minutes Intravenous  Once 06/07/23 1346 06/07/23 1652   06/07/23 1400  ceFEPIme (MAXIPIME) 2 g in sodium chloride 0.9 % 100 mL IVPB        2 g 200 mL/hr over 30 Minutes Intravenous  Once 06/07/23 1349 06/07/23 1512   06/07/23 1400  vancomycin (VANCOCIN) IVPB 1000 mg/200 mL premix        1,000 mg 200 mL/hr over 60 Minutes Intravenous  Once 06/07/23 1349 06/07/23 1652              Family Communication/Anticipated D/C date and plan/Code Status   DVT prophylaxis: SCDs Start: 06/11/23 1358     Code Status: Full Code  Family Communication: None Disposition Plan: Plan to discharge to SNF   Status is: Inpatient Remains inpatient appropriate because:  Bradycardia, hypotension       Subjective:   Interval events noted.  She has no complaints.  No palpitations, dizziness, shortness of breath or chest pain.    Objective:   BP was down to 85/46 on the morning of 06/20/2023  Vitals:   06/20/23 1000 06/20/23 1100 06/20/23 1200 06/20/23 1300  BP:      Pulse: (!) 58 66 (!) 55 (!) 58  Resp: 18 15 17 20   Temp:   98.3 F (36.8 C)   TempSrc:   Oral   SpO2: 100% 98% 100% 100%  Weight:      Height:       No data found.   Intake/Output Summary (Last 24 hours) at 06/20/2023 1425 Last data filed at 06/20/2023 0336 Gross per 24 hour  Intake 593 ml  Output 0 ml  Net 593 ml   Filed Weights   06/19/23 0749 06/19/23 1201 06/20/23 0500  Weight: 122.1 kg 120.6 kg 120.2 kg    Exam:  GEN: NAD SKIN: Warm and dry EYES: EOMI ENT: MMM CV: RRR PULM: CTA B ABD: soft, obese, NT, +BS CNS: AAO x 3, non focal EXT: No edema or tenderness      Data Reviewed:   I have personally reviewed following labs and imaging studies:  Labs: Labs show the following:   Basic Metabolic Panel: Recent Labs  Lab 06/15/23 0438 06/16/23 0414 06/17/23 0508 06/18/23 0400 06/19/23 0340 06/20/23 0530  NA 132* 130* 129* 131* 129* 133*  K 3.8 4.5 4.6 4.1 4.3 4.1  CL 96* 94* 93* 96* 95* 96*  CO2 27 27 24 23  21* 27  GLUCOSE 206* 132* 136* 112* 167* 138*  BUN 38* 57* 78* 51* 75* 51*  CREATININE 4.10* 5.64* 7.25* 5.00* 6.55* 5.10*  CALCIUM 7.8* 7.8* 7.8* 7.9* 7.7* 8.1*  MG 1.8 1.8  --  1.9 1.9  --   PHOS 2.2* 3.9 4.6 3.6 4.4 3.4   GFR Estimated Creatinine Clearance: 11.4 mL/min (A) (by C-G formula based on SCr of 5.1 mg/dL (H)). Liver Function Tests: Recent Labs  Lab 06/16/23 0414 06/17/23 0508 06/18/23 0400 06/19/23 0340 06/20/23 0530  ALBUMIN 3.1* 2.9* 3.2* 3.3* 3.3*   No results for input(s): "LIPASE", "AMYLASE" in the last 168 hours. No results for input(s): "AMMONIA" in the last 168 hours. Coagulation profile No results for  input(s): "INR", "PROTIME" in the last 168 hours.  CBC: Recent Labs  Lab 06/16/23 0414 06/17/23 0508 06/18/23 0400 06/19/23 0340 06/20/23 0530  WBC 12.0* 10.8* 9.5 11.2* 9.4  HGB 9.7* 9.2* 9.1* 9.4* 9.2*  HCT 30.4* 29.1* 29.0* 29.9*  29.8*  MCV 99.0 97.0 100.3* 100.3* 101.0*  PLT 153 145* 141* 154 142*   Cardiac Enzymes: No results for input(s): "CKTOTAL", "CKMB", "CKMBINDEX", "TROPONINI" in the last 168 hours. BNP (last 3 results) No results for input(s): "PROBNP" in the last 8760 hours. CBG: Recent Labs  Lab 06/19/23 1624 06/19/23 1920 06/19/23 2331 06/20/23 0728 06/20/23 1133  GLUCAP 142* 108* 113* 110* 129*   D-Dimer: No results for input(s): "DDIMER" in the last 72 hours. Hgb A1c: No results for input(s): "HGBA1C" in the last 72 hours. Lipid Profile: No results for input(s): "CHOL", "HDL", "LDLCALC", "TRIG", "CHOLHDL", "LDLDIRECT" in the last 72 hours. Thyroid function studies: No results for input(s): "TSH", "T4TOTAL", "T3FREE", "THYROIDAB" in the last 72 hours.  Invalid input(s): "FREET3" Anemia work up: No results for input(s): "VITAMINB12", "FOLATE", "FERRITIN", "TIBC", "IRON", "RETICCTPCT" in the last 72 hours. Sepsis Labs: Recent Labs  Lab 06/17/23 0508 06/18/23 0400 06/19/23 0340 06/20/23 0530  WBC 10.8* 9.5 11.2* 9.4    Microbiology No results found for this or any previous visit (from the past 240 hour(s)).  Procedures and diagnostic studies:  No results found.             LOS: 13 days   Makailey Hodgkin  [employer] loss adjuster on [email or web address]. If 7PM-7AM, please contact night-coverage at [email or web address]     06/20/2023, 2:25 PM

## 2023-06-20 NOTE — Progress Notes (Signed)
ANTICOAGULATION CONSULT NOTE  Pharmacy Consult for IV heparin Indication: atrial fibrillation  Allergies  Allergen Reactions   Sulfa Antibiotics Other (See Comments)   Sulfa Drugs Cross Reactors Other (See Comments)    Doesn't remember     Patient Measurements: Height: 5\' 4"  (162.6 cm) Weight: 120.6 kg (265 lb 14 oz) IBW/kg (Calculated) : 54.7 Heparin Dosing Weight: 115.5 kg  Vital Signs: Temp: 98 F (36.7 C) (07/19 0400) Temp Source: Axillary (07/19 0400) Pulse Rate: 52 (07/19 0500)  Labs: Recent Labs    06/18/23 0400 06/18/23 1345 06/18/23 2148 06/19/23 0340 06/20/23 0530  HGB 9.1*  --   --  9.4* 9.2*  HCT 29.0*  --   --  29.9* 29.8*  PLT 141*  --   --  154 142*  HEPARINUNFRC 0.26* 0.34 0.35  --  0.32  CREATININE 5.00*  --   --  6.55*  --     Estimated Creatinine Clearance: 8.9 mL/min (A) (by C-G formula based on SCr of 6.55 mg/dL (H)).   Medical History: Past Medical History:  Diagnosis Date   Arthritis    "left knee" (12/04/2016)   CAD (coronary artery disease)    a. 12/2016 NSTEMI/PCI: LM nl, LaD 20p/d, D1 20, OM2 20, OM3 20, RCA 73m (3.0x18 Resolute DES). EF 65%.   Chronic lower back pain    Diverticulosis    on CT   ESRD on dialysis [facility])    GERD (gastroesophageal reflux disease)    Hiatal hernia    History of echocardiogram    a. 09/2022 Echo: EF 60-65%, mild asymm LVH, mod reduced RV fxn, RVSP . Mildly dil RA. Mild AI. Ao sclerosis. Mild MR.   Hyperlipidemia    Hypertension    a. 12/2016 labetalol d/c'd 2/2 hypotension in HD.   Morbid obesity (HCC)    Obesity    Persistent atrial fibrillation (HCC)    a. 05/2023 Zio: 100% afib avg HR 52 (32-99).   Pneumonia    "couple times" (12/04/2016)   Type II diabetes mellitus (HCC)     Medications:  Eliquis prior to admission (last dose 7/7 @2100 )  Assessment: 77 year old female admitted with circulatory shock, of which has improved. Plan is for PPM, and thus holding Eliquis prior to  procedure.  7/16 0508 HL 0.35   7/17 0400 HL 0.26.  7/17 1345 HL 0.34 7/17 2148 HL 0.35.  7/19 0530 HL 0.32  Goal of Therapy:  anti-Xa 0.3-0.7 Monitor platelets by anticoagulation protocol: Yes    Plan:  Heparin level is therapeutic. Will continue heparin infusion at 1350 units/hr. Recheck heparin level and CBC with AM labs.   Ronnald Ramp, PharmD, BCPS Clinical Pharmacist 06/20/2023 5:56 AM

## 2023-06-21 DIAGNOSIS — R001 Bradycardia, unspecified: Secondary | ICD-10-CM | POA: Diagnosis not present

## 2023-06-21 DIAGNOSIS — I959 Hypotension, unspecified: Secondary | ICD-10-CM | POA: Diagnosis not present

## 2023-06-21 DIAGNOSIS — I272 Pulmonary hypertension, unspecified: Secondary | ICD-10-CM | POA: Diagnosis not present

## 2023-06-21 LAB — CBC
HCT: 28.9 % — ABNORMAL LOW (ref 36.0–46.0)
Hemoglobin: 9.3 g/dL — ABNORMAL LOW (ref 12.0–15.0)
MCH: 32.5 pg (ref 26.0–34.0)
MCHC: 32.2 g/dL (ref 30.0–36.0)
MCV: 101 fL — ABNORMAL HIGH (ref 80.0–100.0)
Platelets: 145 10*3/uL — ABNORMAL LOW (ref 150–400)
RBC: 2.86 MIL/uL — ABNORMAL LOW (ref 3.87–5.11)
RDW: 16.5 % — ABNORMAL HIGH (ref 11.5–15.5)
WBC: 9.9 10*3/uL (ref 4.0–10.5)
nRBC: 0.2 % (ref 0.0–0.2)

## 2023-06-21 LAB — RENAL FUNCTION PANEL
Albumin: 3.3 g/dL — ABNORMAL LOW (ref 3.5–5.0)
Anion gap: 10 (ref 5–15)
BUN: 71 mg/dL — ABNORMAL HIGH (ref 8–23)
CO2: 26 mmol/L (ref 22–32)
Calcium: 8.2 mg/dL — ABNORMAL LOW (ref 8.9–10.3)
Chloride: 96 mmol/L — ABNORMAL LOW (ref 98–111)
Creatinine, Ser: 6.46 mg/dL — ABNORMAL HIGH (ref 0.44–1.00)
GFR, Estimated: 6 mL/min — ABNORMAL LOW (ref >60)
Glucose, Bld: 103 mg/dL — ABNORMAL HIGH (ref 70–99)
Phosphorus: 4.7 mg/dL — ABNORMAL HIGH (ref 2.5–4.6)
Potassium: 4.6 mmol/L (ref 3.5–5.1)
Sodium: 132 mmol/L — ABNORMAL LOW (ref 135–145)

## 2023-06-21 LAB — GLUCOSE, CAPILLARY
Glucose-Capillary: 82 mg/dL (ref 70–99)
Glucose-Capillary: 124 mg/dL — ABNORMAL HIGH (ref 70–99)
Glucose-Capillary: 116 mg/dL — ABNORMAL HIGH (ref 70–99)

## 2023-06-21 MED ORDER — EPOETIN ALFA 10000 UNIT/ML IJ SOLN
INTRAMUSCULAR | Status: AC
  Filled 2023-06-21: qty 1

## 2023-06-21 MED ORDER — MIDODRINE HCL 5 MG PO TABS
ORAL_TABLET | ORAL | Status: AC
  Filled 2023-06-21: qty 2

## 2023-06-21 NOTE — Progress Notes (Signed)
[facility]  ROUNDING NOTE   Subjective:   A line to be taken out today.   Seen and examined today in her room in ICU.  Her sisters are at bedside.  Hemodialysis treatment 7/18. Tolerated treatment well. UF of 1.5 liters Today blood pressure remains soft in the 80s systolic. Patient denies any acute shortness of breath. She remains on maintenance continuous oxygen at 2 L/min  Objective:  Vital signs in last 24 hours:  Temp:  [97.8 F (36.6 C)-98.3 F (36.8 C)] 97.8 F (36.6 C) (07/20 0701) Pulse Rate:  [29-66] 49 (07/20 0600) Resp:  [15-20] 18 (07/20 0400) BP: (83-93)/(37-52) 89/52 (07/20 0600) SpO2:  [95 %-100 %] 99 % (07/20 0600) Arterial Line BP: (41-111)/(26-47) 41/33 (07/19 2000)  Weight change:  Filed Weights   06/19/23 0749 06/19/23 1201 06/20/23 0500  Weight: 122.1 kg 120.6 kg 120.2 kg    Intake/Output: I/O last 3 completed shifts: In: 1103 [P.O.:1100; I.V.:3] Out: -    Intake/Output this shift:  No intake/output data recorded.  Physical Exam: General: NAD, laying in bed  Head: Normocephalic, atraumatic. Moist oral mucosal membranes  Eyes: Anicteric  Lungs:  Clear to auscultation, normal effort  Heart: Regular rate and rhythm  Abdomen:  Soft, nontender  Extremities: no peripheral edema.  Neurologic: Alert and oriented, moving all four extremities  Skin: No lesions  Access: Lt AVF    Basic Metabolic Panel: Recent Labs  Lab 06/15/23 0438 06/16/23 0414 06/17/23 0508 06/18/23 0400 06/19/23 0340 06/20/23 0530 06/21/23 0437  NA 132* 130* 129* 131* 129* 133* 132*  K 3.8 4.5 4.6 4.1 4.3 4.1 4.6  CL 96* 94* 93* 96* 95* 96* 96*  CO2 27 27 24 23  21* 27 26  GLUCOSE 206* 132* 136* 112* 167* 138* 103*  BUN 38* 57* 78* 51* 75* 51* 71*  CREATININE 4.10* 5.64* 7.25* 5.00* 6.55* 5.10* 6.46*  CALCIUM 7.8* 7.8* 7.8* 7.9* 7.7* 8.1* 8.2*  MG 1.8 1.8  --  1.9 1.9  --   --   PHOS 2.2* 3.9 4.6 3.6 4.4 3.4 4.7*    Liver Function Tests: Recent  Labs  Lab 06/17/23 0508 06/18/23 0400 06/19/23 0340 06/20/23 0530 06/21/23 0437  ALBUMIN 2.9* 3.2* 3.3* 3.3* 3.3*   No results for input(s): "LIPASE", "AMYLASE" in the last 168 hours.  No results for input(s): "AMMONIA" in the last 168 hours.  CBC: Recent Labs  Lab 06/17/23 0508 06/18/23 0400 06/19/23 0340 06/20/23 0530 06/21/23 0437  WBC 10.8* 9.5 11.2* 9.4 9.9  HGB 9.2* 9.1* 9.4* 9.2* 9.3*  HCT 29.1* 29.0* 29.9* 29.8* 28.9*  MCV 97.0 100.3* 100.3* 101.0* 101.0*  PLT 145* 141* 154 142* 145*    Cardiac Enzymes: No results for input(s): "CKTOTAL", "CKMB", "CKMBINDEX", "TROPONINI" in the last 168 hours.  BNP: Invalid input(s): "POCBNP"  CBG: Recent Labs  Lab 06/20/23 0728 06/20/23 1133 06/20/23 1540 06/20/23 2134 06/21/23 0749  GLUCAP 110* 129* 144* 103* 82    Microbiology: Results for orders placed or performed during the hospital encounter of 06/07/23  Blood culture (routine x 2)     Status: None   Collection Time: 06/07/23 12:54 PM   Specimen: BLOOD  Result Value Ref Range Status   Specimen Description BLOOD RIGHT ANTECUBITAL  Final   Special Requests   Final    BOTTLES DRAWN AEROBIC AND ANAEROBIC Blood Culture adequate volume   Culture   Final    NO GROWTH 5 DAYS Performed at [facility], [address], [place], [place] [address]    Report Status 06/12/2023 FINAL  Final  Blood culture (routine x 2)     Status: None   Collection Time: 06/07/23 12:54 PM   Specimen: BLOOD  Result Value Ref Range Status   Specimen Description BLOOD BLOOD RIGHT FOREARM  Final   Special Requests   Final    BOTTLES DRAWN AEROBIC AND ANAEROBIC Blood Culture adequate volume   Culture   Final    NO GROWTH 5 DAYS Performed at [facility], [address] [place], [place] [address]    Report Status 06/12/2023 FINAL  Final  MRSA Next Gen by PCR, Nasal     Status: None   Collection Time: 06/07/23 10:18 PM   Specimen: Nasal Mucosa; Nasal Swab  Result  Value Ref Range Status   MRSA by PCR Next Gen NOT DETECTED NOT DETECTED Final    Comment: (NOTE) The GeneXpert MRSA Assay (FDA approved for NASAL specimens only), is one component of a comprehensive MRSA colonization surveillance program. It is not intended to diagnose MRSA infection nor to guide or monitor treatment for MRSA infections. Test performance is not FDA approved in patients less than 75 years old. Performed at [facility], [address] [place], [place] [address]     Coagulation Studies: No results for input(s): "LABPROT", "INR" in the last 72 hours.  Urinalysis: No results for input(s): "COLORURINE", "LABSPEC", "PHURINE", "GLUCOSEU", "HGBUR", "BILIRUBINUR", "KETONESUR", "PROTEINUR", "UROBILINOGEN", "NITRITE", "LEUKOCYTESUR" in the last 72 hours.  Invalid input(s): "APPERANCEUR"    Imaging: No results found.   Medications:    sodium chloride Stopped (06/10/23 1900)   sodium chloride     anticoagulant sodium citrate      apixaban  5 mg Oral BID   Chlorhexidine Gluconate Cloth  6 each Topical QHS   droxidopa  100 mg Oral TID WC   epoetin (EPOGEN/PROCRIT) injection  10,000 Units Intravenous Q T,Th,Sa-HD   ezetimibe  10 mg Oral Daily   feeding supplement (NEPRO CARB STEADY)  237 mL Oral TID BM   fludrocortisone  0.2 mg Oral BID   insulin aspart  0-20 Units Subcutaneous TID WC   insulin aspart  0-5 Units Subcutaneous QHS   insulin glargine-yfgn  5 Units Subcutaneous Daily   loratadine  10 mg Oral Daily   midodrine  10 mg Oral TID WC   multivitamin  1 tablet Oral QHS   pregabalin  100 mg Oral QHS   pregabalin  50 mg Oral BID   rosuvastatin  10 mg Oral Daily   senna-docusate  1 tablet Oral BID   sodium chloride flush  3 mL Intravenous Q12H   sodium chloride, acetaminophen, alteplase, alum & mag hydroxide-simeth, anticoagulant sodium citrate, bisacodyl, docusate sodium, guaiFENesin-dextromethorphan, heparin, HYDROcodone-acetaminophen, lidocaine  (PF), lidocaine-prilocaine, morphine injection, nitroGLYCERIN, ondansetron (ZOFRAN) IV, mouth rinse, pentafluoroprop-tetrafluoroeth, polyethylene glycol, polyvinyl alcohol, sodium chloride flush  Assessment/ Plan:  [name] is a 77 y.o.  female  with a PMHx of ESRD on HD TTS at [facility], diabetes mellitus type 2, chronic hypotension, peripheral arterial disease, obstructive sleep apnea, GERD, obesity, hyperlipidemia, anemia chronic kidney disease, secondary hyperparathyroidism, who was admitted to [facility] on 06/07/2023 for evaluation of generalized weakness.   Found to have severe hypotension, pulmonary hypertension, atrial fibrillation and tricuspid valve regurgitation all contributing to hypotension.  TTS at [facility],  1  ESRD: on hemodialysis  - Continue  TTS schedule.  -New target weight will have to be  established once patient is able to stand for postdialysis weight. -UF goal today 0.5 to 1 L as tolerated.  2.  Hypotension with severe pulmonary hypertension..  Chronic in nature.  Systolic blood pressure usually in the 80s as an outpatient.   - midodrine 10 mg 3 times daily . Higher dose caused symptomatic bradycardia on dialysis treatment.  - fludrocortisone - droxidopa  - No pressors  - appreciate critical care and cardiology assistance.   3. Anemia of chronic kidney disease Lab Results  Component Value Date   HGB 9.3 (L) 06/21/2023  ESA with HD treatments - may help blood pressures.  Continue Mircera as an outpatient.  4. Diabetes mellitus type II with chronic kidney disease/renal manifestations: noninsulin dependent.  Hemoglobin A1c is 5.8 on 06/07/23.  - continue glucose control.   5. Secondary Hyperparathyroidism:    Lab Results  Component Value Date   PTH 388.6 (H) 01/17/2014   CALCIUM 8.2 (L) 06/21/2023   CAION 0.92 (L) 06/11/2023   PHOS 4.7 (H) 06/21/2023   - Holding Auryxia and Sensipar     LOS: 14 Talal Fritchman 7/20/20248:20  AM

## 2023-06-21 NOTE — Progress Notes (Signed)
0730-Pt assessment complete. Pt is resting in bed. Pts hr and bp remain low but average for her. Pt is asymptomatic. Pt is alert and oriented. Will continue to monitor.

## 2023-06-21 NOTE — Progress Notes (Addendum)
Progress Note    Jacqueline [name]  XBM:[id number] DOB: [birth year]  DOA: 06/07/2023 PCP: Macky Lower, MD      Brief Narrative:    Medical records reviewed and are as summarized below:  Jacqueline [name] is a 77 y.o. female with PMHx significant for HTN, chronic hypotension, type II DM, ESRD on HD TTS, Chronic debility, CAD s/p stent, PAF, OSA, PAD, Left knee arthritis, Chronic lower back pain, GERD, Obesity, HLD who presented to the ED from the dialysis center with generalized weakness and hypotension with BP of 72/42.  Reportedly, patient had nausea, vomiting, diarrhea and left lower quadrant abdominal pain the night prior to admission.  ED Course:  Initial vital signs showed HR of 50 beats/minute, BP 81/51 mm Hg, the RR 16 breaths/minute, and the oxygen saturation 91% on 2L and a temperature of 97.41F (36.6C).   Pertinent Labs/Diagnostics.  EKG:?Afib vs junctional bradycardia rate 40bmp. Incomplete RBB,   CXR, CT scan obtained and show no signs of infection or other abnormality. Patient given, midodrine, atropine for significant bradycardia and  500 cc of fluids and started on broad-spectrum antibiotics Vanco cefepime and Flagyl for  suspected sepsis of unknown source. Patient remained hypotensive despite  intervention as above. PCCM consulted for possible pressors.    Significant Hospital Events: Including procedures, antibiotic start and stop dates in addition to other pertinent events   06/07/2023: admit to ICU with bradycardia, started on dopamine 06/08/2023: weaned off dopamine, hypotensive again, dopamine restarted 06/09/2023:  Remains hypotensive requiring Levophed, (pt is vasculopath, doubt accuracy of cuff BP as pt is awake, alert, and asymptomatic).  Attempted Arterial line but unsuccessful, Vascular Surgery consulted to assist with placement. Nephrology consulted, received HD. 06/10/2023: Continues to require Levophed (6 mcg currently), when Levo weaned down does exhibit  some Bradycardia in the upper 50's.  Cardiology considering R Heart Cath today. Holding on transfer to [facility] for pacer at this time. 06/11/2023: Levophed weaning down, this AM. LHC/RHC with mild-moderate non-obstructive CAD, patent proximal RCA stent, mild-moderately elevated L heart filling pressures, severely elevated R heart/pulm artery pressures, mildly reduced CO/CI. Episode of chest pressure with HD, resolved with morphine. 06/12/2023: Additional episodes of chest tightness/pressure overnight (0150 and 0554). EKG without changes, trops flat. Hypertensive to 227/80, then with NE off HR dropped to 29.  Droxidopa was eventually stopped. 06/13/2023: No events overnight, no chest pain or hypertension. Remains on Levophed.  Decrease Midodrine to 5 mg TID and restart Droxidopa, increase Florinef and stop Solu-cortef in attempts to wean Levophed off. Obtain CT Chest to rule out Pulmonary pathology for etiology of PH. 06/14/2023: chest pain yesterday. Re-started on levophed over night. CT shows no acute finding of ILD. Droxidopa re-started yesterday. 06/15/2023: completed dialysis yesterday. Re-started again on levophed.  06/16/23- met with cardiology to discuss treatment options.  At this time patient remains severely hypotensive on maximal medical management. Her Renal impairment and hypotension limit therapy.  Recommendation for palliative care at this time due to poor prognosis. 06/17/23- patient remains hypotensive with poor prognosis. She is requiring less vasopressor support.  06/18/23- patient is off vasopressors.  Still has overall very poor prognosis.  Recommendation for palliative/hospice.     Assessment/Plan:   Principal Problem:   Symptomatic bradycardia Active Problems:   Hypotension   Permanent atrial fibrillation (HCC)   End stage renal disease (HCC)   Bradycardia   Morbid obesity (HCC)   Severe pulmonary hypertension (HCC)   Goals of care, counseling/discussion  Body mass  index is 45.49 kg/m.  (Morbid obesity)   Circulatory shock (suspected cardiogenic shock), chronic hypotension: Continue midodrine, fludrocortisone and droxidopa.  She was weaned off of IV Levophed drip on 06/16/2024.   Persistent atrial fibrillation, bradycardia: She was evaluated by EP cardiologist and she is deemed not to be a candidate for pacemaker at this time.  She is not on any rate control medications because of bradycardia. Continue Eliquis   CAD s/p RCA PCI, severe pulmonary hypertension, moderate to severe TR: S/p right and left heart cath on 06/11/2023 showed patent RCA stent, mid to moderate nonobstructive CAD, severely elevated right heart and pulmonary artery pressures.   ESRD on hemodialysis anemia of CKD: She had hemodialysis yesterday.  Follow-up with nephrologist.   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 Hemoglobin A1c was 5.8 on 06/07/2023   Thrombocytopenia: fluctuating platelet count but overall improved.   Other comorbidities include chronic debility, chronic low back pain, left knee arthritis    Diet Order             Diet renal with fluid restriction Fluid restriction: 1200 mL Fluid; Room service appropriate? Yes; Fluid consistency: Thin  Diet effective now                            Consultants: Cardiologist Intensivist Nephrologist Palliative care  Procedures: None    Medications:    apixaban  5 mg Oral BID   Chlorhexidine Gluconate Cloth  6 each Topical QHS   droxidopa  100 mg Oral TID WC   epoetin (EPOGEN/PROCRIT) injection  10,000 Units Intravenous Q T,Th,Sa-HD   ezetimibe  10 mg Oral Daily   feeding supplement (NEPRO CARB STEADY)  237 mL Oral TID BM   fludrocortisone  0.2 mg Oral BID   insulin aspart  0-20 Units Subcutaneous TID WC   insulin aspart  0-5 Units Subcutaneous QHS   insulin glargine-yfgn  5  Units Subcutaneous Daily   loratadine  10 mg Oral Daily   midodrine  10 mg Oral TID WC   multivitamin  1 tablet Oral QHS   pregabalin  100 mg Oral QHS   pregabalin  50 mg Oral BID   rosuvastatin  10 mg Oral Daily   senna-docusate  1 tablet Oral BID   sodium chloride flush  3 mL Intravenous Q12H   Continuous Infusions:  sodium chloride Stopped (06/10/23 1900)   sodium chloride     anticoagulant sodium citrate       Anti-infectives (From admission, onward)    Start     Dose/Rate Route Frequency Ordered Stop   06/10/23 1200  vancomycin (VANCOCIN) IVPB 1000 mg/200 mL premix  Status:  Discontinued       Placed in "Followed by" Linked Group   1,000 mg 200 mL/hr over 60 Minutes Intravenous Every T-Th-Sa (Hemodialysis) 06/08/23 1058 06/09/23 1029   06/08/23 1145  vancomycin (VANCOCIN) IVPB 1000 mg/200 mL premix       Placed in "Followed by" Linked Group   1,000 mg 200 mL/hr over 60 Minutes Intravenous  Once 06/08/23 1058 06/08/23 1655   06/08/23 1100  piperacillin-tazobactam (ZOSYN) IVPB 2.25 g  Status:  Discontinued        2.25 g 100 mL/hr over 30 Minutes Intravenous Every 8 hours 06/08/23 1058 06/09/23 1029   06/07/23 1400  metroNIDAZOLE (FLAGYL) IVPB 500 mg  500 mg 100 mL/hr over 60 Minutes Intravenous  Once 06/07/23 1346 06/07/23 1652   06/07/23 1400  ceFEPIme (MAXIPIME) 2 g in sodium chloride 0.9 % 100 mL IVPB        2 g 200 mL/hr over 30 Minutes Intravenous  Once 06/07/23 1349 06/07/23 1512   06/07/23 1400  vancomycin (VANCOCIN) IVPB 1000 mg/200 mL premix        1,000 mg 200 mL/hr over 60 Minutes Intravenous  Once 06/07/23 1349 06/07/23 1652              Family Communication/Anticipated D/C date and plan/Code Status   DVT prophylaxis: SCDs Start: 06/11/23 1358 apixaban (ELIQUIS) tablet 5 mg     Code Status: Full Code  Family Communication: None Disposition Plan: Plan to discharge to SNF   Status is: Inpatient Remains inpatient appropriate because:  Bradycardia, hypotension       Subjective:   Interval events noted.  She has no complaints.  No dizziness, palpitations, chest pain or shortness of breath   Objective:     Vitals:   06/21/23 1330 06/21/23 1400 06/21/23 1405 06/21/23 1455  BP: (!) 96/45 (!) 96/41 (!) 96/42   Pulse: (!) 49 (!) 50 (!) 48   Resp: 15 15 17    Temp:   97.7 F (36.5 C) 98.4 F (36.9 C)  TempSrc:   Oral Oral  SpO2: 100% 100% 100%   Weight:      Height:       No data found.   Intake/Output Summary (Last 24 hours) at 06/21/2023 1537 Last data filed at 06/21/2023 1405 Gross per 24 hour  Intake 480 ml  Output 800 ml  Net -320 ml   Filed Weights   06/19/23 0749 06/19/23 1201 06/20/23 0500  Weight: 122.1 kg 120.6 kg 120.2 kg    Exam:   GEN: NAD SKIN: Warm and dry EYES: EOMI ENT: MMM CV: RRR PULM: CTA B ABD: soft, obese, NT, +BS CNS: AAO x 3, non focal EXT: No edema or tenderness   Data Reviewed:   I have personally reviewed following labs and imaging studies:  Labs: Labs show the following:   Basic Metabolic Panel: Recent Labs  Lab 06/15/23 0438 06/16/23 0414 06/17/23 0508 06/18/23 0400 06/19/23 0340 06/20/23 0530 06/21/23 0437  NA 132* 130* 129* 131* 129* 133* 132*  K 3.8 4.5 4.6 4.1 4.3 4.1 4.6  CL 96* 94* 93* 96* 95* 96* 96*  CO2 27 27 24 23  21* 27 26  GLUCOSE 206* 132* 136* 112* 167* 138* 103*  BUN 38* 57* 78* 51* 75* 51* 71*  CREATININE 4.10* 5.64* 7.25* 5.00* 6.55* 5.10* 6.46*  CALCIUM 7.8* 7.8* 7.8* 7.9* 7.7* 8.1* 8.2*  MG 1.8 1.8  --  1.9 1.9  --   --   PHOS 2.2* 3.9 4.6 3.6 4.4 3.4 4.7*   GFR Estimated Creatinine Clearance: 9 mL/min (A) (by C-G formula based on SCr of 6.46 mg/dL (H)). Liver Function Tests: Recent Labs  Lab 06/17/23 0508 06/18/23 0400 06/19/23 0340 06/20/23 0530 06/21/23 0437  ALBUMIN 2.9* 3.2* 3.3* 3.3* 3.3*   No results for input(s): "LIPASE", "AMYLASE" in the last 168 hours. No results for input(s): "AMMONIA" in the last 168  hours. Coagulation profile No results for input(s): "INR", "PROTIME" in the last 168 hours.  CBC: Recent Labs  Lab 06/17/23 0508 06/18/23 0400 06/19/23 0340 06/20/23 0530 06/21/23 0437  WBC 10.8* 9.5 11.2* 9.4 9.9  HGB 9.2* 9.1* 9.4* 9.2* 9.3*  HCT  29.1* 29.0* 29.9* 29.8* 28.9*  MCV 97.0 100.3* 100.3* 101.0* 101.0*  PLT 145* 141* 154 142* 145*   Cardiac Enzymes: No results for input(s): "CKTOTAL", "CKMB", "CKMBINDEX", "TROPONINI" in the last 168 hours. BNP (last 3 results) No results for input(s): "PROBNP" in the last 8760 hours. CBG: Recent Labs  Lab 06/20/23 0728 06/20/23 1133 06/20/23 1540 06/20/23 2134 06/21/23 0749  GLUCAP 110* 129* 144* 103* 82   D-Dimer: No results for input(s): "DDIMER" in the last 72 hours. Hgb A1c: No results for input(s): "HGBA1C" in the last 72 hours. Lipid Profile: No results for input(s): "CHOL", "HDL", "LDLCALC", "TRIG", "CHOLHDL", "LDLDIRECT" in the last 72 hours. Thyroid function studies: No results for input(s): "TSH", "T4TOTAL", "T3FREE", "THYROIDAB" in the last 72 hours.  Invalid input(s): "FREET3" Anemia work up: No results for input(s): "VITAMINB12", "FOLATE", "FERRITIN", "TIBC", "IRON", "RETICCTPCT" in the last 72 hours. Sepsis Labs: Recent Labs  Lab 06/18/23 0400 06/19/23 0340 06/20/23 0530 06/21/23 0437  WBC 9.5 11.2* 9.4 9.9    Microbiology No results found for this or any previous visit (from the past 240 hour(s)).  Procedures and diagnostic studies:  No results found.             LOS: 14 days   Samaj Wessells  [employer] Chartered loss adjuster on [email or web address]. If 7PM-7AM, please contact night-coverage at [email or web address]     06/21/2023, 3:37 PM

## 2023-06-21 NOTE — Progress Notes (Signed)
  Received patient in bed to unit.   Informed consent signed and in chart.    TX duration: 3.5hrs     Transported back to ICU Hand-off given to patient's nurse. No c/o and no distress noted    Access used: LAVF Access issues: none   Total UF removed: 0.8L Medication(s) given: 10mg  midodrine, 10,000u epogen Post HD VS: 96/42 Post HD weight: UTO     Lynann Beaver  [facility]

## 2023-06-22 DIAGNOSIS — R001 Bradycardia, unspecified: Secondary | ICD-10-CM | POA: Diagnosis not present

## 2023-06-22 LAB — RENAL FUNCTION PANEL
Albumin: 3 g/dL — ABNORMAL LOW (ref 3.5–5.0)
Anion gap: 8 (ref 5–15)
BUN: 48 mg/dL — ABNORMAL HIGH (ref 8–23)
CO2: 27 mmol/L (ref 22–32)
Calcium: 8.4 mg/dL — ABNORMAL LOW (ref 8.9–10.3)
Chloride: 98 mmol/L (ref 98–111)
Creatinine, Ser: 4.62 mg/dL — ABNORMAL HIGH (ref 0.44–1.00)
GFR, Estimated: 9 mL/min — ABNORMAL LOW (ref >60)
Glucose, Bld: 114 mg/dL — ABNORMAL HIGH (ref 70–99)
Phosphorus: 3.8 mg/dL (ref 2.5–4.6)
Potassium: 4.1 mmol/L (ref 3.5–5.1)
Sodium: 133 mmol/L — ABNORMAL LOW (ref 135–145)

## 2023-06-22 LAB — CBC
HCT: 29.3 % — ABNORMAL LOW (ref 36.0–46.0)
Hemoglobin: 9.2 g/dL — ABNORMAL LOW (ref 12.0–15.0)
MCH: 31.5 pg (ref 26.0–34.0)
MCHC: 31.4 g/dL (ref 30.0–36.0)
MCV: 100.3 fL — ABNORMAL HIGH (ref 80.0–100.0)
Platelets: 152 10*3/uL (ref 150–400)
RBC: 2.92 MIL/uL — ABNORMAL LOW (ref 3.87–5.11)
RDW: 17.2 % — ABNORMAL HIGH (ref 11.5–15.5)
WBC: 10 10*3/uL (ref 4.0–10.5)
nRBC: 0.3 % — ABNORMAL HIGH (ref 0.0–0.2)

## 2023-06-22 LAB — GLUCOSE, CAPILLARY
Glucose-Capillary: 99 mg/dL (ref 70–99)
Glucose-Capillary: 152 mg/dL — ABNORMAL HIGH (ref 70–99)
Glucose-Capillary: 133 mg/dL — ABNORMAL HIGH (ref 70–99)
Glucose-Capillary: 141 mg/dL — ABNORMAL HIGH (ref 70–99)

## 2023-06-22 NOTE — Progress Notes (Signed)
[facility]  ROUNDING NOTE   Subjective:     Seen and examined today in her room in ICU.    8 underwent hemodialysis treatment on 7/20 (Saturday).  Tolerated fair.  800 cc net removed. Today blood pressure remains soft with systolic ranging from 80-108. Patient denies any acute shortness of breath. She remains on maintenance continuous oxygen at 2 L/min  Objective:  Vital signs in last 24 hours:  Temp:  [97.7 F (36.5 C)-98.8 F (37.1 C)] 98.8 F (37.1 C) (07/21 0800) Pulse Rate:  [41-64] 56 (07/21 0900) Resp:  [14-19] 17 (07/21 0900) BP: (76-101)/(34-76) 80/65 (07/21 0900) SpO2:  [92 %-100 %] 98 % (07/21 0900) Weight:  [120.3 kg] 120.3 kg (07/21 0500)  Weight change:  Filed Weights   06/19/23 1201 06/20/23 0500 06/22/23 0500  Weight: 120.6 kg 120.2 kg 120.3 kg    Intake/Output: I/O last 3 completed shifts: In: 723 [P.O.:720; I.V.:3] Out: 800 [Other:800]   Intake/Output this shift:  Total I/O In: 10 [P.O.:10] Out: -   Physical Exam: General: NAD, laying in bed  Head: Normocephalic, atraumatic. Moist oral mucosal membranes  Eyes: Anicteric  Lungs:  Clear to auscultation, normal effort  Heart: Regular rate and rhythm  Abdomen:  Soft, nontender  Extremities: no peripheral edema.  Neurologic: Alert and able to answer questions appropriately  Skin: No lesions  Access: Lt AVF    Basic Metabolic Panel: Recent Labs  Lab 06/16/23 0414 06/17/23 0508 06/18/23 0400 06/19/23 0340 06/20/23 0530 06/21/23 0437 06/22/23 0547  NA 130*   < > 131* 129* 133* 132* 133*  K 4.5   < > 4.1 4.3 4.1 4.6 4.1  CL 94*   < > 96* 95* 96* 96* 98  CO2 27   < > 23 21* 27 26 27   GLUCOSE 132*   < > 112* 167* 138* 103* 114*  BUN 57*   < > 51* 75* 51* 71* 48*  CREATININE 5.64*   < > 5.00* 6.55* 5.10* 6.46* 4.62*  CALCIUM 7.8*   < > 7.9* 7.7* 8.1* 8.2* 8.4*  MG 1.8  --  1.9 1.9  --   --   --   PHOS 3.9   < > 3.6 4.4 3.4 4.7* 3.8   < > = values in this interval not  displayed.    Liver Function Tests: Recent Labs  Lab 06/18/23 0400 06/19/23 0340 06/20/23 0530 06/21/23 0437 06/22/23 0547  ALBUMIN 3.2* 3.3* 3.3* 3.3* 3.0*   No results for input(s): "LIPASE", "AMYLASE" in the last 168 hours.  No results for input(s): "AMMONIA" in the last 168 hours.  CBC: Recent Labs  Lab 06/18/23 0400 06/19/23 0340 06/20/23 0530 06/21/23 0437 06/22/23 0547  WBC 9.5 11.2* 9.4 9.9 10.0  HGB 9.1* 9.4* 9.2* 9.3* 9.2*  HCT 29.0* 29.9* 29.8* 28.9* 29.3*  MCV 100.3* 100.3* 101.0* 101.0* 100.3*  PLT 141* 154 142* 145* 152    Cardiac Enzymes: No results for input(s): "CKTOTAL", "CKMB", "CKMBINDEX", "TROPONINI" in the last 168 hours.  BNP: Invalid input(s): "POCBNP"  CBG: Recent Labs  Lab 06/21/23 0749 06/21/23 1813 06/21/23 2218 06/22/23 0731 06/22/23 1135  GLUCAP 82 124* 116* 99 152*    Microbiology: Results for orders placed or performed during the hospital encounter of 06/07/23  Blood culture (routine x 2)     Status: None   Collection Time: 06/07/23 12:54 PM   Specimen: BLOOD  Result Value Ref Range Status   Specimen Description BLOOD RIGHT ANTECUBITAL  Final   Special Requests   Final    BOTTLES DRAWN AEROBIC AND ANAEROBIC Blood Culture adequate volume   Culture   Final    NO GROWTH 5 DAYS Performed at [facility], [address] [place], [place] [address]    Report Status 06/12/2023 FINAL  Final  Blood culture (routine x 2)     Status: None   Collection Time: 06/07/23 12:54 PM   Specimen: BLOOD  Result Value Ref Range Status   Specimen Description BLOOD BLOOD RIGHT FOREARM  Final   Special Requests   Final    BOTTLES DRAWN AEROBIC AND ANAEROBIC Blood Culture adequate volume   Culture   Final    NO GROWTH 5 DAYS Performed at [facility], [address], [place], [place] [address]    Report Status 06/12/2023 FINAL  Final  MRSA Next Gen by PCR, Nasal     Status: None   Collection Time: 06/07/23 10:18 PM    Specimen: Nasal Mucosa; Nasal Swab  Result Value Ref Range Status   MRSA by PCR Next Gen NOT DETECTED NOT DETECTED Final    Comment: (NOTE) The GeneXpert MRSA Assay (FDA approved for NASAL specimens only), is one component of a comprehensive MRSA colonization surveillance program. It is not intended to diagnose MRSA infection nor to guide or monitor treatment for MRSA infections. Test performance is not FDA approved in patients less than 7 years old. Performed at [facility], [address] [place], [place] [address]     Coagulation Studies: No results for input(s): "LABPROT", "INR" in the last 72 hours.  Urinalysis: No results for input(s): "COLORURINE", "LABSPEC", "PHURINE", "GLUCOSEU", "HGBUR", "BILIRUBINUR", "KETONESUR", "PROTEINUR", "UROBILINOGEN", "NITRITE", "LEUKOCYTESUR" in the last 72 hours.  Invalid input(s): "APPERANCEUR"    Imaging: No results found.   Medications:    sodium chloride Stopped (06/10/23 1900)   sodium chloride     anticoagulant sodium citrate      apixaban  5 mg Oral BID   Chlorhexidine Gluconate Cloth  6 each Topical QHS   droxidopa  100 mg Oral TID WC   epoetin (EPOGEN/PROCRIT) injection  10,000 Units Intravenous Q T,Th,Sa-HD   ezetimibe  10 mg Oral Daily   feeding supplement (NEPRO CARB STEADY)  237 mL Oral TID BM   fludrocortisone  0.2 mg Oral BID   insulin aspart  0-20 Units Subcutaneous TID WC   insulin aspart  0-5 Units Subcutaneous QHS   insulin glargine-yfgn  5 Units Subcutaneous Daily   loratadine  10 mg Oral Daily   midodrine  10 mg Oral TID WC   multivitamin  1 tablet Oral QHS   pregabalin  100 mg Oral QHS   pregabalin  50 mg Oral BID   rosuvastatin  10 mg Oral Daily   senna-docusate  1 tablet Oral BID   sodium chloride flush  3 mL Intravenous Q12H   sodium chloride, acetaminophen, alteplase, alum & mag hydroxide-simeth, anticoagulant sodium citrate, bisacodyl, docusate sodium, guaiFENesin-dextromethorphan,  heparin, HYDROcodone-acetaminophen, lidocaine (PF), lidocaine-prilocaine, morphine injection, nitroGLYCERIN, ondansetron (ZOFRAN) IV, mouth rinse, pentafluoroprop-tetrafluoroeth, polyethylene glycol, polyvinyl alcohol, sodium chloride flush  Assessment/ Plan:  Jacqueline [name] is a 77 y.o.  female  with a PMHx of ESRD on HD TTS at [facility], diabetes mellitus type 2, chronic hypotension, peripheral arterial disease, obstructive sleep apnea, GERD, obesity, hyperlipidemia, anemia chronic kidney disease, secondary hyperparathyroidism, who was admitted to [facility] on 06/07/2023 for evaluation of generalized weakness.   Found to have  severe hypotension, pulmonary hypertension, atrial fibrillation and tricuspid valve regurgitation all contributing to hypotension.  TTS at [facility],  1  ESRD: on hemodialysis  - Continue  TTS schedule.  -New target weight will have to be established once patient is able to stand for postdialysis weight.   2.  Chronic hypotension with severe pulmonary hypertension.  Systolic blood pressure usually in the 80s as an outpatient.   - midodrine 10 mg 3 times daily . Higher dose caused symptomatic bradycardia on dialysis treatment.  - fludrocortisone - droxidopa  - No pressors  - appreciate critical care and cardiology assistance.   3. Anemia of chronic kidney disease Lab Results  Component Value Date   HGB 9.2 (L) 06/22/2023  ESA with HD treatments - may help blood pressures.  Continue Mircera as an outpatient.  4. Diabetes mellitus type II with chronic kidney disease/renal manifestations: noninsulin dependent.  Hemoglobin A1c is 5.8 on 06/07/23.  - continue glucose control.   5. Secondary Hyperparathyroidism:    Lab Results  Component Value Date   PTH 388.6 (H) 01/17/2014   CALCIUM 8.4 (L) 06/22/2023   CAION 0.92 (L) 06/11/2023   PHOS 3.8 06/22/2023   - Holding [place] and [employer]     LOS: 15 Jacqueline [name] 7/21/202412:08 PM

## 2023-06-22 NOTE — Progress Notes (Signed)
Progress Note    Jacqueline [name]  UEA:[id number] DOB: 1946-09-05  DOA: 06/07/2023 PCP: Macky Lower, MD      Brief Narrative:    Medical records reviewed and are as summarized below:  Jacqueline [name] is a 77 y.o. female with PMHx significant for HTN, chronic hypotension, type II DM, ESRD on HD TTS, Chronic debility, CAD s/p stent, PAF, OSA, PAD, Left knee arthritis, Chronic lower back pain, GERD, Obesity, HLD who presented to the ED from the dialysis center with generalized weakness and hypotension with BP of 72/42.  Reportedly, patient had nausea, vomiting, diarrhea and left lower quadrant abdominal pain the night prior to admission.  ED Course:  Initial vital signs showed HR of 50 beats/minute, BP 81/51 mm Hg, the RR 16 breaths/minute, and the oxygen saturation 91% on 2L and a temperature of 97.49F (36.6C).   Pertinent Labs/Diagnostics.  EKG:?Afib vs junctional bradycardia rate 40bmp. Incomplete RBB,   CXR, CT scan obtained and show no signs of infection or other abnormality. Patient given, midodrine, atropine for significant bradycardia and  500 cc of fluids and started on broad-spectrum antibiotics Vanco cefepime and Flagyl for  suspected sepsis of unknown source. Patient remained hypotensive despite  intervention as above. PCCM consulted for possible pressors.    Significant Hospital Events: Including procedures, antibiotic start and stop dates in addition to other pertinent events   06/07/2023: admit to ICU with bradycardia, started on dopamine 06/08/2023: weaned off dopamine, hypotensive again, dopamine restarted 06/09/2023:  Remains hypotensive requiring Levophed, (pt is vasculopath, doubt accuracy of cuff BP as pt is awake, alert, and asymptomatic).  Attempted Arterial line but unsuccessful, Vascular Surgery consulted to assist with placement. Nephrology consulted, received HD. 06/10/2023: Continues to require Levophed (6 mcg currently), when Levo weaned down does exhibit  some Bradycardia in the upper 50's.  Cardiology considering R Heart Cath today. Holding on transfer to [facility] for pacer at this time. 06/11/2023: Levophed weaning down, this AM. LHC/RHC with mild-moderate non-obstructive CAD, patent proximal RCA stent, mild-moderately elevated L heart filling pressures, severely elevated R heart/pulm artery pressures, mildly reduced CO/CI. Episode of chest pressure with HD, resolved with morphine. 06/12/2023: Additional episodes of chest tightness/pressure overnight (0150 and 0554). EKG without changes, trops flat. Hypertensive to 227/80, then with NE off HR dropped to 29.  Droxidopa was eventually stopped. 06/13/2023: No events overnight, no chest pain or hypertension. Remains on Levophed.  Decrease Midodrine to 5 mg TID and restart Droxidopa, increase Florinef and stop Solu-cortef in attempts to wean Levophed off. Obtain CT Chest to rule out Pulmonary pathology for etiology of PH. 06/14/2023: chest pain yesterday. Re-started on levophed over night. CT shows no acute finding of ILD. Droxidopa re-started yesterday. 06/15/2023: completed dialysis yesterday. Re-started again on levophed.  06/16/23- met with cardiology to discuss treatment options.  At this time patient remains severely hypotensive on maximal medical management. Her Renal impairment and hypotension limit therapy.  Recommendation for palliative care at this time due to poor prognosis. 06/17/23- patient remains hypotensive with poor prognosis. She is requiring less vasopressor support.  06/18/23- patient is off vasopressors.  Still has overall very poor prognosis.  Recommendation for palliative/hospice.     Assessment/Plan:   Principal Problem:   Symptomatic bradycardia Active Problems:   Hypotension   Permanent atrial fibrillation (HCC)   End stage renal disease (HCC)   Bradycardia   Morbid obesity (HCC)   Severe pulmonary hypertension (HCC)   Goals of care, counseling/discussion  Body mass  index is 45.52 kg/m.  (Morbid obesity)   Circulatory shock (suspected cardiogenic shock), chronic hypotension: She remains on fludrocortisone, droxidopa and midodrine.  She was weaned off of IV Levophed drip on 06/16/2024.   Persistent atrial fibrillation, bradycardia: She was evaluated by EP cardiologist and she is deemed not to be a candidate for pacemaker at this time.  She is not on any rate control medications because of bradycardia. Continue Eliquis   CAD s/p RCA PCI, severe pulmonary hypertension, moderate to severe TR: S/p right and left heart cath on 06/11/2023 showed patent RCA stent, mid to moderate nonobstructive CAD, severely elevated right heart and pulmonary artery pressures.   ESRD on hemodialysis anemia of CKD: Follow-up with nephrologist   Hypokalemia: Improved   Chronic hyponatremia: Asymptomatic.     Type II DM: Continue Semglee and NovoLog as needed for hyperglycemia. Hemoglobin A1c was 5.8 on 06/07/2023   Thrombocytopenia: fluctuating platelet count but overall improved.   Other comorbidities include chronic debility, chronic low back pain, left knee arthritis    Diet Order             Diet renal with fluid restriction Fluid restriction: 1200 mL Fluid; Room service appropriate? Yes; Fluid consistency: Thin  Diet effective now                            Consultants: Cardiologist Intensivist Nephrologist Palliative care  Procedures: None    Medications:    apixaban  5 mg Oral BID   Chlorhexidine Gluconate Cloth  6 each Topical QHS   droxidopa  100 mg Oral TID WC   epoetin (EPOGEN/PROCRIT) injection  10,000 Units Intravenous Q T,Th,Sa-HD   ezetimibe  10 mg Oral Daily   feeding supplement (NEPRO CARB STEADY)  237 mL Oral TID BM   fludrocortisone  0.2 mg Oral BID   insulin aspart  0-20 Units Subcutaneous TID WC   insulin aspart  0-5 Units Subcutaneous QHS   insulin glargine-yfgn  5 Units Subcutaneous Daily   loratadine  10 mg  Oral Daily   midodrine  10 mg Oral TID WC   multivitamin  1 tablet Oral QHS   pregabalin  100 mg Oral QHS   pregabalin  50 mg Oral BID   rosuvastatin  10 mg Oral Daily   senna-docusate  1 tablet Oral BID   sodium chloride flush  3 mL Intravenous Q12H   Continuous Infusions:  sodium chloride Stopped (06/10/23 1900)   sodium chloride     anticoagulant sodium citrate       Anti-infectives (From admission, onward)    Start     Dose/Rate Route Frequency Ordered Stop   06/10/23 1200  vancomycin (VANCOCIN) IVPB 1000 mg/200 mL premix  Status:  Discontinued       Placed in "Followed by" Linked Group   1,000 mg 200 mL/hr over 60 Minutes Intravenous Every T-Th-Sa (Hemodialysis) 06/08/23 1058 06/09/23 1029   06/08/23 1145  vancomycin (VANCOCIN) IVPB 1000 mg/200 mL premix       Placed in "Followed by" Linked Group   1,000 mg 200 mL/hr over 60 Minutes Intravenous  Once 06/08/23 1058 06/08/23 1655   06/08/23 1100  piperacillin-tazobactam (ZOSYN) IVPB 2.25 g  Status:  Discontinued        2.25 g 100 mL/hr over 30 Minutes Intravenous Every 8 hours 06/08/23 1058 06/09/23 1029   06/07/23 1400  metroNIDAZOLE (FLAGYL) IVPB 500 mg  500 mg 100 mL/hr over 60 Minutes Intravenous  Once 06/07/23 1346 06/07/23 1652   06/07/23 1400  ceFEPIme (MAXIPIME) 2 g in sodium chloride 0.9 % 100 mL IVPB        2 g 200 mL/hr over 30 Minutes Intravenous  Once 06/07/23 1349 06/07/23 1512   06/07/23 1400  vancomycin (VANCOCIN) IVPB 1000 mg/200 mL premix        1,000 mg 200 mL/hr over 60 Minutes Intravenous  Once 06/07/23 1349 06/07/23 1652              Family Communication/Anticipated D/C date and plan/Code Status   DVT prophylaxis: SCDs Start: 06/11/23 1358 apixaban (ELIQUIS) tablet 5 mg     Code Status: Full Code  Family Communication: None Disposition Plan: Plan to discharge to SNF   Status is: Inpatient Remains inpatient appropriate because: Bradycardia,  hypotension       Subjective:   No complaints.  No chest pain, dizziness or shortness of breath  Objective:     Vitals:   06/22/23 0500 06/22/23 0600 06/22/23 0800 06/22/23 0900  BP: 90/62 (!) 91/46 (!) 94/43 (!) 80/65  Pulse: (!) 48 (!) 41 (!) 55 (!) 56  Resp: 18 16 16 17   Temp:   98.8 F (37.1 C)   TempSrc:   Oral   SpO2: 96% 97% 96% 98%  Weight: 120.3 kg     Height:       No data found.   Intake/Output Summary (Last 24 hours) at 06/22/2023 1133 Last data filed at 06/22/2023 1610 Gross per 24 hour  Intake 253 ml  Output 800 ml  Net -547 ml   Filed Weights   06/19/23 1201 06/20/23 0500 06/22/23 0500  Weight: 120.6 kg 120.2 kg 120.3 kg    Exam:  GEN: NAD SKIN: Warm and dry EYES: EOMI ENT: MMM CV: RRR PULM: CTA B ABD: soft, obese, NT, +BS CNS: AAO x 3, non focal EXT: No edema or tenderness     Data Reviewed:   I have personally reviewed following labs and imaging studies:  Labs: Labs show the following:   Basic Metabolic Panel: Recent Labs  Lab 06/16/23 0414 06/17/23 0508 06/18/23 0400 06/19/23 0340 06/20/23 0530 06/21/23 0437 06/22/23 0547  NA 130*   < > 131* 129* 133* 132* 133*  K 4.5   < > 4.1 4.3 4.1 4.6 4.1  CL 94*   < > 96* 95* 96* 96* 98  CO2 27   < > 23 21* 27 26 27   GLUCOSE 132*   < > 112* 167* 138* 103* 114*  BUN 57*   < > 51* 75* 51* 71* 48*  CREATININE 5.64*   < > 5.00* 6.55* 5.10* 6.46* 4.62*  CALCIUM 7.8*   < > 7.9* 7.7* 8.1* 8.2* 8.4*  MG 1.8  --  1.9 1.9  --   --   --   PHOS 3.9   < > 3.6 4.4 3.4 4.7* 3.8   < > = values in this interval not displayed.   GFR Estimated Creatinine Clearance: 12.6 mL/min (A) (by C-G formula based on SCr of 4.62 mg/dL (H)). Liver Function Tests: Recent Labs  Lab 06/18/23 0400 06/19/23 0340 06/20/23 0530 06/21/23 0437 06/22/23 0547  ALBUMIN 3.2* 3.3* 3.3* 3.3* 3.0*   No results for input(s): "LIPASE", "AMYLASE" in the last 168 hours. No results for input(s): "AMMONIA" in the  last 168 hours. Coagulation profile No results for input(s): "INR", "PROTIME" in the last 168 hours.  CBC: Recent Labs  Lab 06/18/23 0400 06/19/23 0340 06/20/23 0530 06/21/23 0437 06/22/23 0547  WBC 9.5 11.2* 9.4 9.9 10.0  HGB 9.1* 9.4* 9.2* 9.3* 9.2*  HCT 29.0* 29.9* 29.8* 28.9* 29.3*  MCV 100.3* 100.3* 101.0* 101.0* 100.3*  PLT 141* 154 142* 145* 152   Cardiac Enzymes: No results for input(s): "CKTOTAL", "CKMB", "CKMBINDEX", "TROPONINI" in the last 168 hours. BNP (last 3 results) No results for input(s): "PROBNP" in the last 8760 hours. CBG: Recent Labs  Lab 06/20/23 2134 06/21/23 0749 06/21/23 1813 06/21/23 2218 06/22/23 0731  GLUCAP 103* 82 124* 116* 99   D-Dimer: No results for input(s): "DDIMER" in the last 72 hours. Hgb A1c: No results for input(s): "HGBA1C" in the last 72 hours. Lipid Profile: No results for input(s): "CHOL", "HDL", "LDLCALC", "TRIG", "CHOLHDL", "LDLDIRECT" in the last 72 hours. Thyroid function studies: No results for input(s): "TSH", "T4TOTAL", "T3FREE", "THYROIDAB" in the last 72 hours.  Invalid input(s): "FREET3" Anemia work up: No results for input(s): "VITAMINB12", "FOLATE", "FERRITIN", "TIBC", "IRON", "RETICCTPCT" in the last 72 hours. Sepsis Labs: Recent Labs  Lab 06/19/23 0340 06/20/23 0530 06/21/23 0437 06/22/23 0547  WBC 11.2* 9.4 9.9 10.0    Microbiology No results found for this or any previous visit (from the past 240 hour(s)).  Procedures and diagnostic studies:  No results found.             LOS: 15 days   Roshan Roback  [employer] Chartered loss adjuster on [email or web address]. If 7PM-7AM, please contact night-coverage at [email or web address]     06/22/2023, 11:33 AM

## 2023-06-23 DIAGNOSIS — I959 Hypotension, unspecified: Secondary | ICD-10-CM | POA: Diagnosis not present

## 2023-06-23 LAB — RENAL FUNCTION PANEL
Albumin: 3.2 g/dL — ABNORMAL LOW (ref 3.5–5.0)
Anion gap: 14 (ref 5–15)
BUN: 60 mg/dL — ABNORMAL HIGH (ref 8–23)
CO2: 25 mmol/L (ref 22–32)
Calcium: 8.5 mg/dL — ABNORMAL LOW (ref 8.9–10.3)
Chloride: 96 mmol/L — ABNORMAL LOW (ref 98–111)
Creatinine, Ser: 6.21 mg/dL — ABNORMAL HIGH (ref 0.44–1.00)
GFR, Estimated: 6 mL/min — ABNORMAL LOW (ref >60)
Glucose, Bld: 114 mg/dL — ABNORMAL HIGH (ref 70–99)
Phosphorus: 5.2 mg/dL — ABNORMAL HIGH (ref 2.5–4.6)
Potassium: 4.6 mmol/L (ref 3.5–5.1)
Sodium: 135 mmol/L (ref 135–145)

## 2023-06-23 LAB — CBC
HCT: 31.4 % — ABNORMAL LOW (ref 36.0–46.0)
Hemoglobin: 9.8 g/dL — ABNORMAL LOW (ref 12.0–15.0)
MCH: 31.2 pg (ref 26.0–34.0)
MCHC: 31.2 g/dL (ref 30.0–36.0)
MCV: 100 fL (ref 80.0–100.0)
Platelets: 157 10*3/uL (ref 150–400)
RBC: 3.14 MIL/uL — ABNORMAL LOW (ref 3.87–5.11)
RDW: 17.6 % — ABNORMAL HIGH (ref 11.5–15.5)
WBC: 10.9 10*3/uL — ABNORMAL HIGH (ref 4.0–10.5)
nRBC: 0.3 % — ABNORMAL HIGH (ref 0.0–0.2)

## 2023-06-23 LAB — GLUCOSE, CAPILLARY
Glucose-Capillary: 99 mg/dL (ref 70–99)
Glucose-Capillary: 88 mg/dL (ref 70–99)
Glucose-Capillary: 107 mg/dL — ABNORMAL HIGH (ref 70–99)
Glucose-Capillary: 116 mg/dL — ABNORMAL HIGH (ref 70–99)
Glucose-Capillary: 132 mg/dL — ABNORMAL HIGH (ref 70–99)

## 2023-06-23 NOTE — TOC Progression Note (Addendum)
Transition of Care [facility]) - Progression Note    Patient Details  Name: Jacqueline [name] MRN: [id number] Date of Birth: 12-20-1945  Transition of [facility]) CM/SW Contact  Kreg Shropshire, RN Phone Number: 06/23/2023, 1:32 PM  Clinical Narrative:     Cm called Tammy of [facility]. She asked when pt is coming back. Cm informed Tammy that pt needs STR. Cm confirmed that pt can come back to [facility] for STR and then transition back to long term care. Cm informed Tammy that cm will start auth.  1417- Cm tried to start [employer] authorization. Called rep and they stated pt [employer] termed in 2023. Cm sent secure message to Francesca Oman of admission verifications to verify and update pt insurance policy.  1452-Cm called Tammy of [facility] to ask about authorization process for pt STR. She looked pt up and stated that pt has another insurance called [employer] that will come see pt when she goes back to [facility] and refer to nursing and PT/OT. Cm does not have to complete authorization for STR for pt.  Expected Discharge Plan: Long Term Nursing Home Barriers to Discharge: Continued Medical Work up  Expected Discharge Plan and Services       Living arrangements for the past 2 months: Skilled Nursing Facility Expected Discharge Date: 06/10/23                                     Social Determinants of Health (SDOH) Interventions SDOH Screenings   Food Insecurity: No Food Insecurity (06/07/2023)  Housing: Low Risk  (06/07/2023)  Transportation Needs: No Transportation Needs (06/07/2023)  Utilities: Not At Risk (06/07/2023)  Depression (PHQ2-9): Low Risk  (06/27/2021)  Financial Resource Strain: Medium Risk (06/27/2021)  Physical Activity: Inactive (06/27/2021)  Stress: No Stress Concern Present (06/27/2021)  Tobacco Use: Medium Risk (06/07/2023)    Readmission Risk Interventions    05/22/2022   12:26 PM  Readmission Risk Prevention Plan  Transportation Screening  Complete  PCP or Specialist Appt within 3-5 Days Complete  HRI or Home Care Consult Complete  Social Work Consult for Recovery Care Planning/Counseling Complete  Palliative Care Screening Complete  Medication Review Oceanographer) Complete

## 2023-06-23 NOTE — Progress Notes (Signed)
Progress Note    Jacqueline [name]  ZOX:[id number] DOB: 1945/12/31  DOA: 06/07/2023 PCP: Macky Lower, MD      Brief Narrative:    Medical records reviewed and are as summarized below:  Jacqueline [name] is a 77 y.o. female with PMHx significant for HTN, chronic hypotension, type II DM, ESRD on HD TTS, Chronic debility, CAD s/p stent, PAF, OSA, PAD, Left knee arthritis, Chronic lower back pain, GERD, Obesity, HLD who presented to the ED from the dialysis center with generalized weakness and hypotension with BP of 72/42.  Reportedly, patient had nausea, vomiting, diarrhea and left lower quadrant abdominal pain the night prior to admission.  ED Course:  Initial vital signs showed HR of 50 beats/minute, BP 81/51 mm Hg, the RR 16 breaths/minute, and the oxygen saturation 91% on 2L and a temperature of 97.81F (36.6C).   Pertinent Labs/Diagnostics.  EKG:?Afib vs junctional bradycardia rate 40bmp. Incomplete RBB,   CXR, CT scan obtained and show no signs of infection or other abnormality. Patient given, midodrine, atropine for significant bradycardia and  500 cc of fluids and started on broad-spectrum antibiotics Vanco cefepime and Flagyl for  suspected sepsis of unknown source. Patient remained hypotensive despite  intervention as above. PCCM consulted for possible pressors.    Significant Hospital Events: Including procedures, antibiotic start and stop dates in addition to other pertinent events   06/07/2023: admit to ICU with bradycardia, started on dopamine 06/08/2023: weaned off dopamine, hypotensive again, dopamine restarted 06/09/2023:  Remains hypotensive requiring Levophed, (pt is vasculopath, doubt accuracy of cuff BP as pt is awake, alert, and asymptomatic).  Attempted Arterial line but unsuccessful, Vascular Surgery consulted to assist with placement. Nephrology consulted, received HD. 06/10/2023: Continues to require Levophed (6 mcg currently), when Levo weaned down does exhibit  some Bradycardia in the upper 50's.  Cardiology considering R Heart Cath today. Holding on transfer to [facility] for pacer at this time. 06/11/2023: Levophed weaning down, this AM. LHC/RHC with mild-moderate non-obstructive CAD, patent proximal RCA stent, mild-moderately elevated L heart filling pressures, severely elevated R heart/pulm artery pressures, mildly reduced CO/CI. Episode of chest pressure with HD, resolved with morphine. 06/12/2023: Additional episodes of chest tightness/pressure overnight (0150 and 0554). EKG without changes, trops flat. Hypertensive to 227/80, then with NE off HR dropped to 29.  Droxidopa was eventually stopped. 06/13/2023: No events overnight, no chest pain or hypertension. Remains on Levophed.  Decrease Midodrine to 5 mg TID and restart Droxidopa, increase Florinef and stop Solu-cortef in attempts to wean Levophed off. Obtain CT Chest to rule out Pulmonary pathology for etiology of PH. 06/14/2023: chest pain yesterday. Re-started on levophed over night. CT shows no acute finding of ILD. Droxidopa re-started yesterday. 06/15/2023: completed dialysis yesterday. Re-started again on levophed.  06/16/23- met with cardiology to discuss treatment options.  At this time patient remains severely hypotensive on maximal medical management. Her Renal impairment and hypotension limit therapy.  Recommendation for palliative care at this time due to poor prognosis. 06/17/23- patient remains hypotensive with poor prognosis. She is requiring less vasopressor support.  06/18/23- patient is off vasopressors.  Still has overall very poor prognosis.  Recommendation for palliative/hospice.     Assessment/Plan:   Principal Problem:   Symptomatic bradycardia Active Problems:   Hypotension   Permanent atrial fibrillation (HCC)   End stage renal disease (HCC)   Bradycardia   Morbid obesity (HCC)   Severe pulmonary hypertension (HCC)   Goals of care, counseling/discussion  Body mass  index is 45.52 kg/m.  (Morbid obesity)   Circulatory shock (suspected cardiogenic shock), chronic hypotension: She was weaned off of IV Levophed drip on 06/16/2024. Continue midodrine, fludrocortisone and droxidopa.   Persistent atrial fibrillation, bradycardia: She was evaluated by EP cardiologist and she is deemed not to be a candidate for pacemaker at this time.  She is not on any rate control medications because of bradycardia. Continue Eliquis   CAD s/p RCA PCI, severe pulmonary hypertension, moderate to severe TR: S/p right and left heart cath on 06/11/2023 showed patent RCA stent, mid to moderate nonobstructive CAD, severely elevated right heart and pulmonary artery pressures.   ESRD on hemodialysis anemia of CKD: Plan for hemodialysis tomorrow.  Follow-up with nephrologist.   Hypokalemia: Improved   Chronic hyponatremia: Asymptomatic.     Type II DM: Continue Semglee and NovoLog as needed for hyperglycemia. Hemoglobin A1c was 5.8 on 06/07/2023   Thrombocytopenia: fluctuating platelet count but overall improved.   Other comorbidities include chronic debility, chronic low back pain, left knee arthritis    Diet Order             Diet Carb Modified Fluid consistency: Thin; Room service appropriate? Yes; Fluid restriction: 1200 mL Fluid  Diet effective now                            Consultants: Cardiologist Intensivist Nephrologist Palliative care  Procedures: None    Medications:    apixaban  5 mg Oral BID   droxidopa  100 mg Oral TID WC   epoetin (EPOGEN/PROCRIT) injection  10,000 Units Intravenous Q T,Th,Sa-HD   ezetimibe  10 mg Oral Daily   feeding supplement (NEPRO CARB STEADY)  237 mL Oral TID BM   fludrocortisone  0.2 mg Oral BID   insulin aspart  0-20 Units Subcutaneous TID WC   insulin aspart  0-5 Units Subcutaneous QHS   insulin glargine-yfgn  5 Units Subcutaneous Daily   loratadine  10 mg Oral Daily   midodrine  10 mg Oral TID  WC   multivitamin  1 tablet Oral QHS   pregabalin  100 mg Oral QHS   pregabalin  50 mg Oral BID   rosuvastatin  10 mg Oral Daily   senna-docusate  1 tablet Oral BID   sodium chloride flush  3 mL Intravenous Q12H   Continuous Infusions:  sodium chloride Stopped (06/10/23 1900)   sodium chloride     anticoagulant sodium citrate       Anti-infectives (From admission, onward)    Start     Dose/Rate Route Frequency Ordered Stop   06/10/23 1200  vancomycin (VANCOCIN) IVPB 1000 mg/200 mL premix  Status:  Discontinued       Placed in "Followed by" Linked Group   1,000 mg 200 mL/hr over 60 Minutes Intravenous Every T-Th-Sa (Hemodialysis) 06/08/23 1058 06/09/23 1029   06/08/23 1145  vancomycin (VANCOCIN) IVPB 1000 mg/200 mL premix       Placed in "Followed by" Linked Group   1,000 mg 200 mL/hr over 60 Minutes Intravenous  Once 06/08/23 1058 06/08/23 1655   06/08/23 1100  piperacillin-tazobactam (ZOSYN) IVPB 2.25 g  Status:  Discontinued        2.25 g 100 mL/hr over 30 Minutes Intravenous Every 8 hours 06/08/23 1058 06/09/23 1029   06/07/23 1400  metroNIDAZOLE (FLAGYL) IVPB 500 mg        500 mg 100 mL/hr over 60 Minutes  Intravenous  Once 06/07/23 1346 06/07/23 1652   06/07/23 1400  ceFEPIme (MAXIPIME) 2 g in sodium chloride 0.9 % 100 mL IVPB        2 g 200 mL/hr over 30 Minutes Intravenous  Once 06/07/23 1349 06/07/23 1512   06/07/23 1400  vancomycin (VANCOCIN) IVPB 1000 mg/200 mL premix        1,000 mg 200 mL/hr over 60 Minutes Intravenous  Once 06/07/23 1349 06/07/23 1652              Family Communication/Anticipated D/C date and plan/Code Status   DVT prophylaxis: SCDs Start: 06/11/23 1358 apixaban (ELIQUIS) tablet 5 mg     Code Status: Full Code  Family Communication: None Disposition Plan: Plan to discharge to SNF   Status is: Inpatient Remains inpatient appropriate because: Bradycardia, hypotension       Subjective:   Interval events noted.  No  dizziness, shortness of breath palpitations or chest pain.  Objective:     Vitals:   06/23/23 0800 06/23/23 0900 06/23/23 1000 06/23/23 1100  BP: (!) 91/55 (!) 90/50 (!) 85/42 (!) 95/44  Pulse: (!) 54 (!) 51 (!) 54 (!) 51  Resp: 15 16 15 15   Temp:      TempSrc:      SpO2: 99% 98% 100% 99%  Weight:      Height:       No data found.   Intake/Output Summary (Last 24 hours) at 06/23/2023 1609 Last data filed at 06/23/2023 1047 Gross per 24 hour  Intake 260 ml  Output --  Net 260 ml   Filed Weights   06/19/23 1201 06/20/23 0500 06/22/23 0500  Weight: 120.6 kg 120.2 kg 120.3 kg    Exam:  GEN: NAD SKIN: Warm and dry EYES: EOMI ENT: MMM CV: RRR PULM: CTA B ABD: soft, obese, NT, +BS CNS: AAO x 3, non focal EXT: No edema or tenderness     Data Reviewed:   I have personally reviewed following labs and imaging studies:  Labs: Labs show the following:   Basic Metabolic Panel: Recent Labs  Lab 06/18/23 0400 06/19/23 0340 06/20/23 0530 06/21/23 0437 06/22/23 0547 06/23/23 0455  NA 131* 129* 133* 132* 133* 135  K 4.1 4.3 4.1 4.6 4.1 4.6  CL 96* 95* 96* 96* 98 96*  CO2 23 21* 27 26 27 25   GLUCOSE 112* 167* 138* 103* 114* 114*  BUN 51* 75* 51* 71* 48* 60*  CREATININE 5.00* 6.55* 5.10* 6.46* 4.62* 6.21*  CALCIUM 7.9* 7.7* 8.1* 8.2* 8.4* 8.5*  MG 1.9 1.9  --   --   --   --   PHOS 3.6 4.4 3.4 4.7* 3.8 5.2*   GFR Estimated Creatinine Clearance: 9.4 mL/min (A) (by C-G formula based on SCr of 6.21 mg/dL (H)). Liver Function Tests: Recent Labs  Lab 06/19/23 0340 06/20/23 0530 06/21/23 0437 06/22/23 0547 06/23/23 0455  ALBUMIN 3.3* 3.3* 3.3* 3.0* 3.2*   No results for input(s): "LIPASE", "AMYLASE" in the last 168 hours. No results for input(s): "AMMONIA" in the last 168 hours. Coagulation profile No results for input(s): "INR", "PROTIME" in the last 168 hours.  CBC: Recent Labs  Lab 06/19/23 0340 06/20/23 0530 06/21/23 0437 06/22/23 0547  06/23/23 0455  WBC 11.2* 9.4 9.9 10.0 10.9*  HGB 9.4* 9.2* 9.3* 9.2* 9.8*  HCT 29.9* 29.8* 28.9* 29.3* 31.4*  MCV 100.3* 101.0* 101.0* 100.3* 100.0  PLT 154 142* 145* 152 157   Cardiac Enzymes: No results for input(s): "  CKTOTAL", "CKMB", "CKMBINDEX", "TROPONINI" in the last 168 hours. BNP (last 3 results) No results for input(s): "PROBNP" in the last 8760 hours. CBG: Recent Labs  Lab 06/22/23 1135 06/22/23 1523 06/22/23 2113 06/23/23 0733 06/23/23 1129  GLUCAP 152* 133* 141* 88 107*   D-Dimer: No results for input(s): "DDIMER" in the last 72 hours. Hgb A1c: No results for input(s): "HGBA1C" in the last 72 hours. Lipid Profile: No results for input(s): "CHOL", "HDL", "LDLCALC", "TRIG", "CHOLHDL", "LDLDIRECT" in the last 72 hours. Thyroid function studies: No results for input(s): "TSH", "T4TOTAL", "T3FREE", "THYROIDAB" in the last 72 hours.  Invalid input(s): "FREET3" Anemia work up: No results for input(s): "VITAMINB12", "FOLATE", "FERRITIN", "TIBC", "IRON", "RETICCTPCT" in the last 72 hours. Sepsis Labs: Recent Labs  Lab 06/20/23 0530 06/21/23 0437 06/22/23 0547 06/23/23 0455  WBC 9.4 9.9 10.0 10.9*    Microbiology No results found for this or any previous visit (from the past 240 hour(s)).  Procedures and diagnostic studies:  No results found.             LOS: 16 days   Donnita Farina  [employer] Chartered loss adjuster on [email or web address]. If 7PM-7AM, please contact night-coverage at [email or web address]     06/23/2023, 4:09 PM

## 2023-06-23 NOTE — Progress Notes (Signed)
[facility]  ROUNDING NOTE   Subjective:    Patient seen sitting up in bed Alert and oriented Eating breakfast  Blood pressure 90/50 2L [place]  Objective:  Vital signs in last 24 hours:  Temp:  [98 F (36.7 C)-98.3 F (36.8 C)] 98.2 F (36.8 C) (07/22 0400) Pulse Rate:  [26-62] 51 (07/22 0900) Resp:  [15-21] 16 (07/22 0900) BP: (80-108)/(43-57) 90/50 (07/22 0900) SpO2:  [92 %-100 %] 98 % (07/22 0900)  Weight change:  Filed Weights   06/19/23 1201 06/20/23 0500 06/22/23 0500  Weight: 120.6 kg 120.2 kg 120.3 kg    Intake/Output: I/O last 3 completed shifts: In: 503 [P.O.:500; I.V.:3] Out: -    Intake/Output this shift:  No intake/output data recorded.  Physical Exam: General: NAD, laying in bed  Head: Normocephalic, atraumatic. Moist oral mucosal membranes  Eyes: Anicteric  Lungs:  Clear to auscultation, normal effort  Heart: Regular rate and rhythm  Abdomen:  Soft, nontender  Extremities: no peripheral edema.  Neurologic: Alert and able to answer questions appropriately  Skin: No lesions  Access: Lt AVF    Basic Metabolic Panel: Recent Labs  Lab 06/18/23 0400 06/19/23 0340 06/20/23 0530 06/21/23 0437 06/22/23 0547 06/23/23 0455  NA 131* 129* 133* 132* 133* 135  K 4.1 4.3 4.1 4.6 4.1 4.6  CL 96* 95* 96* 96* 98 96*  CO2 23 21* 27 26 27 25   GLUCOSE 112* 167* 138* 103* 114* 114*  BUN 51* 75* 51* 71* 48* 60*  CREATININE 5.00* 6.55* 5.10* 6.46* 4.62* 6.21*  CALCIUM 7.9* 7.7* 8.1* 8.2* 8.4* 8.5*  MG 1.9 1.9  --   --   --   --   PHOS 3.6 4.4 3.4 4.7* 3.8 5.2*    Liver Function Tests: Recent Labs  Lab 06/19/23 0340 06/20/23 0530 06/21/23 0437 06/22/23 0547 06/23/23 0455  ALBUMIN 3.3* 3.3* 3.3* 3.0* 3.2*   No results for input(s): "LIPASE", "AMYLASE" in the last 168 hours.  No results for input(s): "AMMONIA" in the last 168 hours.  CBC: Recent Labs  Lab 06/19/23 0340 06/20/23 0530 06/21/23 0437 06/22/23 0547 06/23/23 0455   WBC 11.2* 9.4 9.9 10.0 10.9*  HGB 9.4* 9.2* 9.3* 9.2* 9.8*  HCT 29.9* 29.8* 28.9* 29.3* 31.4*  MCV 100.3* 101.0* 101.0* 100.3* 100.0  PLT 154 142* 145* 152 157    Cardiac Enzymes: No results for input(s): "CKTOTAL", "CKMB", "CKMBINDEX", "TROPONINI" in the last 168 hours.  BNP: Invalid input(s): "POCBNP"  CBG: Recent Labs  Lab 06/22/23 0731 06/22/23 1135 06/22/23 1523 06/22/23 2113 06/23/23 0733  GLUCAP 99 152* 133* 141* 88    Microbiology: Results for orders placed or performed during the hospital encounter of 06/07/23  Blood culture (routine x 2)     Status: None   Collection Time: 06/07/23 12:54 PM   Specimen: BLOOD  Result Value Ref Range Status   Specimen Description BLOOD RIGHT ANTECUBITAL  Final   Special Requests   Final    BOTTLES DRAWN AEROBIC AND ANAEROBIC Blood Culture adequate volume   Culture   Final    NO GROWTH 5 DAYS Performed at [facility], [address], [place], [place] [address]    Report Status 06/12/2023 FINAL  Final  Blood culture (routine x 2)     Status: None   Collection Time: 06/07/23 12:54 PM   Specimen: BLOOD  Result Value Ref Range Status   Specimen Description BLOOD BLOOD RIGHT FOREARM  Final   Special Requests  Final    BOTTLES DRAWN AEROBIC AND ANAEROBIC Blood Culture adequate volume   Culture   Final    NO GROWTH 5 DAYS Performed at [facility], [address] [place] [place], [place] [address]    Report Status 06/12/2023 FINAL  Final  MRSA Next Gen by PCR, Nasal     Status: None   Collection Time: 06/07/23 10:18 PM   Specimen: Nasal Mucosa; Nasal Swab  Result Value Ref Range Status   MRSA by PCR Next Gen NOT DETECTED NOT DETECTED Final    Comment: (NOTE) The GeneXpert MRSA Assay (FDA approved for NASAL specimens only), is one component of a comprehensive MRSA colonization surveillance program. It is not intended to diagnose MRSA infection nor to guide or monitor treatment for MRSA infections. Test  performance is not FDA approved in patients less than 45 years old. Performed at [facility], [address] [place], [place] [address]     Coagulation Studies: No results for input(s): "LABPROT", "INR" in the last 72 hours.  Urinalysis: No results for input(s): "COLORURINE", "LABSPEC", "PHURINE", "GLUCOSEU", "HGBUR", "BILIRUBINUR", "KETONESUR", "PROTEINUR", "UROBILINOGEN", "NITRITE", "LEUKOCYTESUR" in the last 72 hours.  Invalid input(s): "APPERANCEUR"    Imaging: No results found.   Medications:    sodium chloride Stopped (06/10/23 1900)   sodium chloride     anticoagulant sodium citrate      apixaban  5 mg Oral BID   Chlorhexidine Gluconate Cloth  6 each Topical QHS   droxidopa  100 mg Oral TID WC   epoetin (EPOGEN/PROCRIT) injection  10,000 Units Intravenous Q T,Th,Sa-HD   ezetimibe  10 mg Oral Daily   feeding supplement (NEPRO CARB STEADY)  237 mL Oral TID BM   fludrocortisone  0.2 mg Oral BID   insulin aspart  0-20 Units Subcutaneous TID WC   insulin aspart  0-5 Units Subcutaneous QHS   insulin glargine-yfgn  5 Units Subcutaneous Daily   loratadine  10 mg Oral Daily   midodrine  10 mg Oral TID WC   multivitamin  1 tablet Oral QHS   pregabalin  100 mg Oral QHS   pregabalin  50 mg Oral BID   rosuvastatin  10 mg Oral Daily   senna-docusate  1 tablet Oral BID   sodium chloride flush  3 mL Intravenous Q12H   sodium chloride, acetaminophen, alteplase, alum & mag hydroxide-simeth, anticoagulant sodium citrate, bisacodyl, docusate sodium, guaiFENesin-dextromethorphan, heparin, HYDROcodone-acetaminophen, lidocaine (PF), lidocaine-prilocaine, morphine injection, nitroGLYCERIN, ondansetron (ZOFRAN) IV, mouth rinse, pentafluoroprop-tetrafluoroeth, polyethylene glycol, polyvinyl alcohol, sodium chloride flush  Assessment/ Plan:  Jacqueline [name] is a 77 y.o.  female  with a PMHx of ESRD on HD TTS at [facility], diabetes mellitus type 2, chronic  hypotension, peripheral arterial disease, obstructive sleep apnea, GERD, obesity, hyperlipidemia, anemia chronic kidney disease, secondary hyperparathyroidism, who was admitted to [facility] on 06/07/2023 for evaluation of generalized weakness.   Found to have severe hypotension, pulmonary hypertension, atrial fibrillation and tricuspid valve regurgitation all contributing to hypotension.  TTS at [facility],  1  ESRD: on hemodialysis  - Next treatment scheduled for Tuesday - Will attempt to obtain standing weight prior to dialysis.    2.  Chronic hypotension with severe pulmonary hypertension.  Systolic blood pressure usually in the 80s as an outpatient.  Weaned off pressors - midodrine 10 mg 3 times daily . Higher dose caused symptomatic bradycardia on dialysis treatment.  - fludrocortisone - droxidopa  - appreciate critical care and cardiology assistance.  3. Anemia of chronic kidney disease Lab Results  Component Value Date   HGB 9.8 (L) 06/23/2023  Continue 16109 unit IV with HD treatments - may help blood pressures.  Continue Mircera as an outpatient.  4. Diabetes mellitus type II with chronic kidney disease/renal manifestations: noninsulin dependent.  Hemoglobin A1c is 5.8 on 06/07/23.  - continue glucose control.   5. Secondary Hyperparathyroidism:    Lab Results  Component Value Date   PTH 388.6 (H) 01/17/2014   CALCIUM 8.5 (L) 06/23/2023   CAION 0.92 (L) 06/11/2023   PHOS 5.2 (H) 06/23/2023   - Calcium and phosphorus acceptable - Holding Auryxia and Sensipar     LOS: 16 Peggy Monk 7/22/202410:18 AM

## 2023-06-23 NOTE — Plan of Care (Signed)
PMT shadowing. Notes reviewed. Spoke with attending team, and patient will be moving out of step down.   Goals set for full code and full scope, and to continue dialysis until it can no longer be offered.

## 2023-06-24 DIAGNOSIS — I959 Hypotension, unspecified: Secondary | ICD-10-CM | POA: Diagnosis not present

## 2023-06-24 LAB — GLUCOSE, CAPILLARY
Glucose-Capillary: 114 mg/dL — ABNORMAL HIGH (ref 70–99)
Glucose-Capillary: 86 mg/dL (ref 70–99)
Glucose-Capillary: 208 mg/dL — ABNORMAL HIGH (ref 70–99)
Glucose-Capillary: 112 mg/dL — ABNORMAL HIGH (ref 70–99)

## 2023-06-24 LAB — CBC
HCT: 31.4 % — ABNORMAL LOW (ref 36.0–46.0)
Hemoglobin: 9.6 g/dL — ABNORMAL LOW (ref 12.0–15.0)
MCH: 31.5 pg (ref 26.0–34.0)
MCHC: 30.6 g/dL (ref 30.0–36.0)
MCV: 103 fL — ABNORMAL HIGH (ref 80.0–100.0)
Platelets: 155 10*3/uL (ref 150–400)
RBC: 3.05 MIL/uL — ABNORMAL LOW (ref 3.87–5.11)
RDW: 17.8 % — ABNORMAL HIGH (ref 11.5–15.5)
WBC: 10.3 10*3/uL (ref 4.0–10.5)
nRBC: 0.2 % (ref 0.0–0.2)

## 2023-06-24 LAB — RENAL FUNCTION PANEL
Albumin: 3.1 g/dL — ABNORMAL LOW (ref 3.5–5.0)
Anion gap: 13 (ref 5–15)
BUN: 86 mg/dL — ABNORMAL HIGH (ref 8–23)
CO2: 25 mmol/L (ref 22–32)
Calcium: 8.2 mg/dL — ABNORMAL LOW (ref 8.9–10.3)
Chloride: 97 mmol/L — ABNORMAL LOW (ref 98–111)
Creatinine, Ser: 7.56 mg/dL — ABNORMAL HIGH (ref 0.44–1.00)
GFR, Estimated: 5 mL/min — ABNORMAL LOW (ref >60)
Glucose, Bld: 127 mg/dL — ABNORMAL HIGH (ref 70–99)
Phosphorus: 5.3 mg/dL — ABNORMAL HIGH (ref 2.5–4.6)
Potassium: 4.9 mmol/L (ref 3.5–5.1)
Sodium: 135 mmol/L (ref 135–145)

## 2023-06-24 MED ORDER — EPOETIN ALFA 10000 UNIT/ML IJ SOLN
INTRAMUSCULAR | Status: AC
  Filled 2023-06-24: qty 1

## 2023-06-24 MED ORDER — MIDODRINE HCL 5 MG PO TABS
ORAL_TABLET | ORAL | Status: AC
  Filled 2023-06-24: qty 2

## 2023-06-24 NOTE — Progress Notes (Addendum)
0740 Down to Dialysis unit for a treatment. 1200 Back from dialysis-incomplete treatment. 1600 Up to [facility] with help no other events today.

## 2023-06-24 NOTE — TOC Progression Note (Signed)
Transition of Care [facility]) - Progression Note    Patient Details  Name: Jacqueline [name] MRN: [id number] Date of Birth: Oct 26, 1946  Transition of Care [facility]) CM/SW Contact  Kreg Shropshire, RN Phone Number: 06/24/2023, 3:24 PM  Clinical Narrative:     Cm spoke with granddaughter about concerns about pt losing her bed at [facility]. Cm LVM Tammy of [facility] to inquire. Cm awaiting for callback  Expected Discharge Plan: Long Term Nursing Home Barriers to Discharge: Continued Medical Work up  Expected Discharge Plan and Services       Living arrangements for the past 2 months: Skilled Nursing Facility Expected Discharge Date: 06/10/23                                     Social Determinants of Health (SDOH) Interventions SDOH Screenings   Food Insecurity: No Food Insecurity (06/07/2023)  Housing: Low Risk  (06/07/2023)  Transportation Needs: No Transportation Needs (06/07/2023)  Utilities: Not At Risk (06/07/2023)  Depression (PHQ2-9): Low Risk  (06/27/2021)  Financial Resource Strain: Medium Risk (06/27/2021)  Physical Activity: Inactive (06/27/2021)  Stress: No Stress Concern Present (06/27/2021)  Tobacco Use: Medium Risk (06/07/2023)    Readmission Risk Interventions    05/22/2022   12:26 PM  Readmission Risk Prevention Plan  Transportation Screening Complete  PCP or Specialist Appt within 3-5 Days Complete  HRI or Home Care Consult Complete  Social Work Consult for Recovery Care Planning/Counseling Complete  Palliative Care Screening Complete  Medication Review Oceanographer) Complete

## 2023-06-24 NOTE — Progress Notes (Signed)
PT Cancellation Note  Patient Details Name: Jacqueline [name] MRN: [id number] DOB: 10/04/1946   Cancelled Treatment:    Reason Eval/Treat Not Completed: Other (comment). Pt is currently in HD, not available for therapy. Will re-attempt another time.   Velinda Wrobel 06/24/2023, 10:08 AM Elizabeth Palau, PT, DPT, GCS [phone]

## 2023-06-24 NOTE — Progress Notes (Signed)
  Received patient in bed to unit.   Informed consent signed and in chart.    TX duration:2.40  clotted chambers      Transported back to ICU Hand-off given to patient's nurse. No   c/o and no distress noted    Access used: L AVF Access issues: none   Total UF removed: 0.7L Medication(s) given: 10mg  midodrine, 10,000u epogen Post HD VS: 84/39 Post HD weight: 122.5kg     Lynann Beaver  [facility]

## 2023-06-24 NOTE — Progress Notes (Signed)
[facility]  ROUNDING NOTE   Subjective:   Patient seen and evaluated during dialysis   HEMODIALYSIS FLOWSHEET:  Blood Flow Rate (mL/min): 300 mL/min Arterial Pressure (mmHg): -90 mmHg Venous Pressure (mmHg): 200 mmHg TMP (mmHg): 17 mmHg Ultrafiltration Rate (mL/min): 733 mL/min Dialysate Flow Rate (mL/min): 300 ml/min Dialysis Fluid Bolus: Normal Saline Bolus Amount (mL): 200 mL  No complaints to offer Blood pressure 85/43  Objective:  Vital signs in last 24 hours:  Temp:  [97.5 F (36.4 C)-98.8 F (37.1 C)] 97.5 F (36.4 C) (07/23 0759) Pulse Rate:  [50-63] 58 (07/23 1000) Resp:  [13-20] 15 (07/23 1000) BP: (76-112)/(34-62) 85/43 (07/23 1000) SpO2:  [88 %-100 %] 100 % (07/23 1000) Weight:  [123.1 kg-123.3 kg] 123.3 kg (07/23 0803)  Weight change:  Filed Weights   06/22/23 0500 06/24/23 0421 06/24/23 0803  Weight: 120.3 kg 123.1 kg 123.3 kg    Intake/Output: I/O last 3 completed shifts: In: 10 [I.V.:10] Out: 1 [Stool:1]   Intake/Output this shift:  No intake/output data recorded.  Physical Exam: General: NAD, laying in bed  Head: Normocephalic, atraumatic. Moist oral mucosal membranes  Eyes: Anicteric  Lungs:  Clear to auscultation, normal effort  Heart: Regular rate and rhythm  Abdomen:  Soft, nontender  Extremities: no peripheral edema.  Neurologic: Alert and able to answer questions appropriately  Skin: No lesions  Access: Lt AVF    Basic Metabolic Panel: Recent Labs  Lab 06/18/23 0400 06/19/23 0340 06/20/23 0530 06/21/23 0437 06/22/23 0547 06/23/23 0455 06/24/23 0805  NA 131* 129* 133* 132* 133* 135 135  K 4.1 4.3 4.1 4.6 4.1 4.6 4.9  CL 96* 95* 96* 96* 98 96* 97*  CO2 23 21* 27 26 27 25 25   GLUCOSE 112* 167* 138* 103* 114* 114* 127*  BUN 51* 75* 51* 71* 48* 60* 86*  CREATININE 5.00* 6.55* 5.10* 6.46* 4.62* 6.21* 7.56*  CALCIUM 7.9* 7.7* 8.1* 8.2* 8.4* 8.5* 8.2*  MG 1.9 1.9  --   --   --   --   --   PHOS 3.6 4.4 3.4  4.7* 3.8 5.2* 5.3*    Liver Function Tests: Recent Labs  Lab 06/20/23 0530 06/21/23 0437 06/22/23 0547 06/23/23 0455 06/24/23 0805  ALBUMIN 3.3* 3.3* 3.0* 3.2* 3.1*   No results for input(s): "LIPASE", "AMYLASE" in the last 168 hours.  No results for input(s): "AMMONIA" in the last 168 hours.  CBC: Recent Labs  Lab 06/20/23 0530 06/21/23 0437 06/22/23 0547 06/23/23 0455 06/24/23 0805  WBC 9.4 9.9 10.0 10.9* 10.3  HGB 9.2* 9.3* 9.2* 9.8* 9.6*  HCT 29.8* 28.9* 29.3* 31.4* 31.4*  MCV 101.0* 101.0* 100.3* 100.0 103.0*  PLT 142* 145* 152 157 155    Cardiac Enzymes: No results for input(s): "CKTOTAL", "CKMB", "CKMBINDEX", "TROPONINI" in the last 168 hours.  BNP: Invalid input(s): "POCBNP"  CBG: Recent Labs  Lab 06/23/23 0733 06/23/23 1129 06/23/23 1643 06/23/23 2142 06/24/23 0721  GLUCAP 88 107* 116* 132* 114*    Microbiology: Results for orders placed or performed during the hospital encounter of 06/07/23  Blood culture (routine x 2)     Status: None   Collection Time: 06/07/23 12:54 PM   Specimen: BLOOD  Result Value Ref Range Status   Specimen Description BLOOD RIGHT ANTECUBITAL  Final   Special Requests   Final    BOTTLES DRAWN AEROBIC AND ANAEROBIC Blood Culture adequate volume   Culture   Final    NO GROWTH 5 DAYS Performed at  [facility], [address] [place], [place] [address]    Report Status 06/12/2023 FINAL  Final  Blood culture (routine x 2)     Status: None   Collection Time: 06/07/23 12:54 PM   Specimen: BLOOD  Result Value Ref Range Status   Specimen Description BLOOD BLOOD RIGHT FOREARM  Final   Special Requests   Final    BOTTLES DRAWN AEROBIC AND ANAEROBIC Blood Culture adequate volume   Culture   Final    NO GROWTH 5 DAYS Performed at [facility], [address], [place], [place] [address]    Report Status 06/12/2023 FINAL  Final  MRSA Next Gen by PCR, Nasal     Status: None   Collection Time: 06/07/23  10:18 PM   Specimen: Nasal Mucosa; Nasal Swab  Result Value Ref Range Status   MRSA by PCR Next Gen NOT DETECTED NOT DETECTED Final    Comment: (NOTE) The GeneXpert MRSA Assay (FDA approved for NASAL specimens only), is one component of a comprehensive MRSA colonization surveillance program. It is not intended to diagnose MRSA infection nor to guide or monitor treatment for MRSA infections. Test performance is not FDA approved in patients less than 70 years old. Performed at [facility], [address] [place], [place] [address]     Coagulation Studies: No results for input(s): "LABPROT", "INR" in the last 72 hours.  Urinalysis: No results for input(s): "COLORURINE", "LABSPEC", "PHURINE", "GLUCOSEU", "HGBUR", "BILIRUBINUR", "KETONESUR", "PROTEINUR", "UROBILINOGEN", "NITRITE", "LEUKOCYTESUR" in the last 72 hours.  Invalid input(s): "APPERANCEUR"    Imaging: No results found.   Medications:    sodium chloride Stopped (06/10/23 1900)   sodium chloride     anticoagulant sodium citrate      apixaban  5 mg Oral BID   droxidopa  100 mg Oral TID WC   epoetin (EPOGEN/PROCRIT) injection  10,000 Units Intravenous Q T,Th,Sa-HD   ezetimibe  10 mg Oral Daily   feeding supplement (NEPRO CARB STEADY)  237 mL Oral TID BM   fludrocortisone  0.2 mg Oral BID   insulin aspart  0-20 Units Subcutaneous TID WC   insulin aspart  0-5 Units Subcutaneous QHS   insulin glargine-yfgn  5 Units Subcutaneous Daily   loratadine  10 mg Oral Daily   midodrine  10 mg Oral TID WC   multivitamin  1 tablet Oral QHS   pregabalin  100 mg Oral QHS   pregabalin  50 mg Oral BID   rosuvastatin  10 mg Oral Daily   senna-docusate  1 tablet Oral BID   sodium chloride flush  3 mL Intravenous Q12H   sodium chloride, acetaminophen, alteplase, alum & mag hydroxide-simeth, anticoagulant sodium citrate, bisacodyl, docusate sodium, guaiFENesin-dextromethorphan, heparin, HYDROcodone-acetaminophen, lidocaine  (PF), lidocaine-prilocaine, morphine injection, nitroGLYCERIN, ondansetron (ZOFRAN) IV, mouth rinse, pentafluoroprop-tetrafluoroeth, polyethylene glycol, polyvinyl alcohol, sodium chloride flush  Assessment/ Plan:  [name] is a 77 y.o.  female  with a PMHx of ESRD on HD TTS at [facility], diabetes mellitus type 2, chronic hypotension, peripheral arterial disease, obstructive sleep apnea, GERD, obesity, hyperlipidemia, anemia chronic kidney disease, secondary hyperparathyroidism, who was admitted to [facility] on 06/07/2023 for evaluation of generalized weakness.   Found to have severe hypotension, pulmonary hypertension, atrial fibrillation and tricuspid valve regurgitation all contributing to hypotension.  TTS at [facility],  1  ESRD: on hemodialysis  - Receiving dialysis treatment today, UF goal 1L as tolerated - Unable to obtain standing weight prior to  treatment due to instability - Will provide UF only treatment tomorrow for additional fluid removal.    2.  Chronic hypotension with severe pulmonary hypertension.  Systolic blood pressure usually in the 80s as an outpatient.  Weaned off pressors - midodrine 10 mg 3 times daily . Higher dose caused symptomatic bradycardia on dialysis treatment.  - fludrocortisone - droxidopa  - appreciate critical care and cardiology assistance.   3. Anemia of chronic kidney disease Lab Results  Component Value Date   HGB 9.6 (L) 06/24/2023  Continue 96295 unit IV with HD treatments - Hgb acceptable Continue Mircera as an outpatient.  4. Diabetes mellitus type II with chronic kidney disease/renal manifestations: noninsulin dependent.  Hemoglobin A1c is 5.8 on 06/07/23.  - continue glucose control.   5. Secondary Hyperparathyroidism:    Lab Results  Component Value Date   PTH 388.6 (H) 01/17/2014   CALCIUM 8.2 (L) 06/24/2023   CAION 0.92 (L) 06/11/2023   PHOS 5.3 (H) 06/24/2023   - Will continue to monitor bone  minerals during this admission - Holding Auryxia and Sensipar     LOS: 17 Willford Rabideau 7/23/202410:07 AM

## 2023-06-24 NOTE — Progress Notes (Addendum)
Progress Note    Jacqueline [name]  BMW:[id number] DOB: 1946-02-18  DOA: 06/07/2023 PCP: Jacqueline Lower, MD      Brief Narrative:    Medical records reviewed and are as summarized below:  Jacqueline [name] is a 77 y.o. female with PMHx significant for HTN, chronic hypotension, type II DM, ESRD on HD TTS, Chronic debility, CAD s/p stent, PAF, OSA, PAD, Left knee arthritis, Chronic [name] back pain, GERD, Obesity, HLD who presented to the ED from the dialysis center with generalized weakness and hypotension with BP of 72/42.  Reportedly, patient had nausea, vomiting, diarrhea and left [name] quadrant abdominal pain the night prior to admission.  ED Course:  Initial vital signs showed HR of 50 beats/minute, BP 81/51 mm Hg, the RR 16 breaths/minute, and the oxygen saturation 91% on 2L and a temperature of 97.14F (36.6C).   Pertinent Labs/Diagnostics.  EKG:?Afib vs junctional bradycardia rate 40bmp. Incomplete RBB,   CXR, CT scan obtained and show no signs of infection or other abnormality. Patient given, midodrine, atropine for significant bradycardia and  500 cc of fluids and started on broad-spectrum antibiotics Vanco cefepime and Flagyl for  suspected sepsis of unknown source. Patient remained hypotensive despite  intervention as above. PCCM consulted for possible pressors.    Significant Hospital Events: Including procedures, antibiotic start and stop dates in addition to other pertinent events   06/07/2023: admit to ICU with bradycardia, started on dopamine 06/08/2023: weaned off dopamine, hypotensive again, dopamine restarted 06/09/2023:  Remains hypotensive requiring Levophed, (pt is vasculopath, doubt accuracy of cuff BP as pt is awake, alert, and asymptomatic).  Attempted Arterial line but unsuccessful, Vascular Surgery consulted to assist with placement. Nephrology consulted, received HD. 06/10/2023: Continues to require Levophed (6 mcg currently), when Levo weaned down does exhibit  some Bradycardia in the upper 50's.  Cardiology considering R Heart Cath today. Holding on transfer to [facility] for pacer at this time. 06/11/2023: Levophed weaning down, this AM. LHC/RHC with mild-moderate non-obstructive CAD, patent proximal RCA stent, mild-moderately elevated L heart filling pressures, severely elevated R heart/pulm artery pressures, mildly reduced CO/CI. Episode of chest pressure with HD, resolved with morphine. 06/12/2023: Additional episodes of chest tightness/pressure overnight (0150 and 0554). EKG without changes, trops flat. Hypertensive to 227/80, then with NE off HR dropped to 29.  Droxidopa was eventually stopped. 06/13/2023: No events overnight, no chest pain or hypertension. Remains on Levophed.  Decrease Midodrine to 5 mg TID and restart Droxidopa, increase Florinef and stop Solu-cortef in attempts to wean Levophed off. Obtain CT Chest to rule out Pulmonary pathology for etiology of PH. 06/14/2023: chest pain yesterday. Re-started on levophed over night. CT shows no acute finding of ILD. Droxidopa re-started yesterday. 06/15/2023: completed dialysis yesterday. Re-started again on levophed.  06/16/23- met with cardiology to discuss treatment options.  At this time patient remains severely hypotensive on maximal medical management. Her Renal impairment and hypotension limit therapy.  Recommendation for palliative care at this time due to poor prognosis. 06/17/23- patient remains hypotensive with poor prognosis. She is requiring less vasopressor support.  06/18/23- patient is off vasopressors.  Still has overall very poor prognosis.  Recommendation for palliative/hospice.     Assessment/Plan:   Principal Problem:   Symptomatic bradycardia Active Problems:   Hypotension   Permanent atrial fibrillation (HCC)   End stage renal disease (HCC)   Bradycardia   Morbid obesity (HCC)   Severe pulmonary hypertension (HCC)   Goals of care, counseling/discussion  Body mass  index is 46.36 kg/m.  (Morbid obesity)   Circulatory shock (suspected cardiogenic shock), acute on chronic hypotension: She was weaned off of IV Levophed drip on 06/16/2024. BP is still low and has been fluctuating.  BP was 76/43 on the hemodialysis unit.  Most recent BP 80/33. Continue midodrine, fludrocortisone and droxidopa.   Persistent atrial fibrillation, bradycardia: She was evaluated by EP cardiologist and she is deemed not to be a candidate for pacemaker at this time.  She is not on any rate control medications because of bradycardia. Continue Eliquis   CAD s/p RCA PCI, severe pulmonary hypertension, moderate to severe TR: S/p right and left heart cath on 06/11/2023 showed patent RCA stent, mid to moderate nonobstructive CAD, severely elevated right heart and pulmonary artery pressures.   ESRD on hemodialysis anemia of CKD: Plan for hemodialysis tomorrow.  Follow-up with nephrologist.   Hypokalemia: Improved   Chronic hyponatremia: Asymptomatic.     Type II DM: Continue Semglee and NovoLog as needed for hyperglycemia. Hemoglobin A1c was 5.8 on 06/07/2023   Thrombocytopenia: fluctuating platelet count but overall improved.   Other comorbidities include chronic debility, chronic low back pain, left knee arthritis  Plan of care was discussed with Jacqueline [name] (at [phone]).  Diagnoses and prognosis were discussed.  All her questions were answered.  She was concerned about patient losing her bed at [facility].  She requested to speak to the case manager.  I asked Jacqueline [name], case manager, to give her a call.    Diet Order             Diet Carb Modified Fluid consistency: Thin; Room service appropriate? Yes; Fluid restriction: 1200 mL Fluid  Diet effective now                            Consultants: Cardiologist Intensivist Nephrologist Palliative care  Procedures: None    Medications:    apixaban  5 mg Oral BID   droxidopa  100 mg Oral TID  WC   epoetin (EPOGEN/PROCRIT) injection  10,000 Units Intravenous Q T,Th,Sa-HD   ezetimibe  10 mg Oral Daily   feeding supplement (NEPRO CARB STEADY)  237 mL Oral TID BM   fludrocortisone  0.2 mg Oral BID   insulin aspart  0-20 Units Subcutaneous TID WC   insulin aspart  0-5 Units Subcutaneous QHS   insulin glargine-yfgn  5 Units Subcutaneous Daily   loratadine  10 mg Oral Daily   midodrine  10 mg Oral TID WC   multivitamin  1 tablet Oral QHS   pregabalin  100 mg Oral QHS   pregabalin  50 mg Oral BID   rosuvastatin  10 mg Oral Daily   senna-docusate  1 tablet Oral BID   sodium chloride flush  3 mL Intravenous Q12H   Continuous Infusions:  sodium chloride Stopped (06/10/23 1900)   sodium chloride     anticoagulant sodium citrate       Anti-infectives (From admission, onward)    Start     Dose/Rate Route Frequency Ordered Stop   06/10/23 1200  vancomycin (VANCOCIN) IVPB 1000 mg/200 mL premix  Status:  Discontinued       Placed in "Followed by" Linked Group   1,000 mg 200 mL/hr over 60 Minutes Intravenous Every T-Th-Sa (Hemodialysis) 06/08/23 1058 06/09/23 1029   06/08/23 1145  vancomycin (VANCOCIN) IVPB 1000 mg/200 mL premix       Placed in "Followed by"  Linked Group   1,000 mg 200 mL/hr over 60 Minutes Intravenous  Once 06/08/23 1058 06/08/23 1655   06/08/23 1100  piperacillin-tazobactam (ZOSYN) IVPB 2.25 g  Status:  Discontinued        2.25 g 100 mL/hr over 30 Minutes Intravenous Every 8 hours 06/08/23 1058 06/09/23 1029   06/07/23 1400  metroNIDAZOLE (FLAGYL) IVPB 500 mg        500 mg 100 mL/hr over 60 Minutes Intravenous  Once 06/07/23 1346 06/07/23 1652   06/07/23 1400  ceFEPIme (MAXIPIME) 2 g in sodium chloride 0.9 % 100 mL IVPB        2 g 200 mL/hr over 30 Minutes Intravenous  Once 06/07/23 1349 06/07/23 1512   06/07/23 1400  vancomycin (VANCOCIN) IVPB 1000 mg/200 mL premix        1,000 mg 200 mL/hr over 60 Minutes Intravenous  Once 06/07/23 1349 06/07/23 1652               Family Communication/Anticipated D/C date and plan/Code Status   DVT prophylaxis: SCDs Start: 06/11/23 1358 apixaban (ELIQUIS) tablet 5 mg     Code Status: Full Code  Family Communication: None Disposition Plan: Plan to discharge to SNF   Status is: Inpatient Remains inpatient appropriate because: Bradycardia, hypotension       Subjective:   Interval events noted.  Patient was seen in the hemodialysis unit.  She was unhappy that she did not complete 3 hours of dialysis session.  She had dialysis about 2 and half hours and hemodialysis was terminated prematurely because her blood clotted off in the machine.  Her blood pressure was 76/48 at the time of my visit.  She was asymptomatic.   Objective:     Vitals:   06/24/23 1030 06/24/23 1100 06/24/23 1106 06/24/23 1138  BP: (!) 81/42 (!) 78/41 (!) 84/39   Pulse: (!) 53 (!) 49 (!) 48   Resp: 15 17 20    Temp:   98 F (36.7 C)   TempSrc:   Oral   SpO2: 100% 97% 98%   Weight:    122.5 kg  Height:       No data found.   Intake/Output Summary (Last 24 hours) at 06/24/2023 1503 Last data filed at 06/24/2023 1106 Gross per 24 hour  Intake --  Output 701 ml  Net -701 ml   Filed Weights   06/24/23 0421 06/24/23 0803 06/24/23 1138  Weight: 123.1 kg 123.3 kg 122.5 kg    Exam:  GEN: NAD SKIN: Warm and dry EYES: No pallor or icterus ENT: MMM CV: RRR PULM: CTA B ABD: soft, obese, NT, +BS CNS: AAO x 3, non focal EXT: No edema or tenderness    Data Reviewed:   I have personally reviewed following labs and imaging studies:  Labs: Labs show the following:   Basic Metabolic Panel: Recent Labs  Lab 06/18/23 0400 06/19/23 0340 06/20/23 0530 06/21/23 0437 06/22/23 0547 06/23/23 0455 06/24/23 0805  NA 131* 129* 133* 132* 133* 135 135  K 4.1 4.3 4.1 4.6 4.1 4.6 4.9  CL 96* 95* 96* 96* 98 96* 97*  CO2 23 21* 27 26 27 25 25   GLUCOSE 112* 167* 138* 103* 114* 114* 127*  BUN 51* 75* 51* 71*  48* 60* 86*  CREATININE 5.00* 6.55* 5.10* 6.46* 4.62* 6.21* 7.56*  CALCIUM 7.9* 7.7* 8.1* 8.2* 8.4* 8.5* 8.2*  MG 1.9 1.9  --   --   --   --   --  PHOS 3.6 4.4 3.4 4.7* 3.8 5.2* 5.3*   GFR Estimated Creatinine Clearance: 7.8 mL/min (A) (by C-G formula based on SCr of 7.56 mg/dL (H)). Liver Function Tests: Recent Labs  Lab 06/20/23 0530 06/21/23 0437 06/22/23 0547 06/23/23 0455 06/24/23 0805  ALBUMIN 3.3* 3.3* 3.0* 3.2* 3.1*   No results for input(s): "LIPASE", "AMYLASE" in the last 168 hours. No results for input(s): "AMMONIA" in the last 168 hours. Coagulation profile No results for input(s): "INR", "PROTIME" in the last 168 hours.  CBC: Recent Labs  Lab 06/20/23 0530 06/21/23 0437 06/22/23 0547 06/23/23 0455 06/24/23 0805  WBC 9.4 9.9 10.0 10.9* 10.3  HGB 9.2* 9.3* 9.2* 9.8* 9.6*  HCT 29.8* 28.9* 29.3* 31.4* 31.4*  MCV 101.0* 101.0* 100.3* 100.0 103.0*  PLT 142* 145* 152 157 155   Cardiac Enzymes: No results for input(s): "CKTOTAL", "CKMB", "CKMBINDEX", "TROPONINI" in the last 168 hours. BNP (last 3 results) No results for input(s): "PROBNP" in the last 8760 hours. CBG: Recent Labs  Lab 06/23/23 1129 06/23/23 1643 06/23/23 2142 06/24/23 0721 06/24/23 1215  GLUCAP 107* 116* 132* 114* 86   D-Dimer: No results for input(s): "DDIMER" in the last 72 hours. Hgb A1c: No results for input(s): "HGBA1C" in the last 72 hours. Lipid Profile: No results for input(s): "CHOL", "HDL", "LDLCALC", "TRIG", "CHOLHDL", "LDLDIRECT" in the last 72 hours. Thyroid function studies: No results for input(s): "TSH", "T4TOTAL", "T3FREE", "THYROIDAB" in the last 72 hours.  Invalid input(s): "FREET3" Anemia work up: No results for input(s): "VITAMINB12", "FOLATE", "FERRITIN", "TIBC", "IRON", "RETICCTPCT" in the last 72 hours. Sepsis Labs: Recent Labs  Lab 06/21/23 0437 06/22/23 0547 06/23/23 0455 06/24/23 0805  WBC 9.9 10.0 10.9* 10.3    Microbiology No results found for  this or any previous visit (from the past 240 hour(s)).  Procedures and diagnostic studies:  No results found.             LOS: 17 days   Lyall Faciane  [employer] loss adjuster on [email or web address]. If 7PM-7AM, please contact night-coverage at [email or web address]     06/24/2023, 3:03 PM

## 2023-06-25 ENCOUNTER — Other Ambulatory Visit (HOSPITAL_COMMUNITY): Payer: Self-pay

## 2023-06-25 DIAGNOSIS — R001 Bradycardia, unspecified: Secondary | ICD-10-CM | POA: Diagnosis not present

## 2023-06-25 LAB — HEPATIC FUNCTION PANEL
ALT: 25 U/L (ref 0–44)
AST: 28 U/L (ref 15–41)
Albumin: 3.2 g/dL — ABNORMAL LOW (ref 3.5–5.0)
Alkaline Phosphatase: 46 U/L (ref 38–126)
Bilirubin, Direct: 0.1 mg/dL (ref 0.0–0.2)
Indirect Bilirubin: 0.4 mg/dL (ref 0.3–0.9)
Total Bilirubin: 0.5 mg/dL (ref 0.3–1.2)
Total Protein: 6.7 g/dL (ref 6.5–8.1)

## 2023-06-25 LAB — RENAL FUNCTION PANEL
Albumin: 3.1 g/dL — ABNORMAL LOW (ref 3.5–5.0)
Anion gap: 12 (ref 5–15)
BUN: 62 mg/dL — ABNORMAL HIGH (ref 8–23)
CO2: 23 mmol/L (ref 22–32)
Calcium: 8 mg/dL — ABNORMAL LOW (ref 8.9–10.3)
Chloride: 100 mmol/L (ref 98–111)
Creatinine, Ser: 6.63 mg/dL — ABNORMAL HIGH (ref 0.44–1.00)
GFR, Estimated: 6 mL/min — ABNORMAL LOW (ref >60)
Glucose, Bld: 131 mg/dL — ABNORMAL HIGH (ref 70–99)
Phosphorus: 4 mg/dL (ref 2.5–4.6)
Potassium: 4.5 mmol/L (ref 3.5–5.1)
Sodium: 135 mmol/L (ref 135–145)

## 2023-06-25 LAB — CBC
HCT: 33.7 % — ABNORMAL LOW (ref 36.0–46.0)
Hemoglobin: 10.1 g/dL — ABNORMAL LOW (ref 12.0–15.0)
MCH: 32.5 pg (ref 26.0–34.0)
MCHC: 30 g/dL (ref 30.0–36.0)
MCV: 108.4 fL — ABNORMAL HIGH (ref 80.0–100.0)
Platelets: 118 10*3/uL — ABNORMAL LOW (ref 150–400)
RBC: 3.11 MIL/uL — ABNORMAL LOW (ref 3.87–5.11)
RDW: 18.3 % — ABNORMAL HIGH (ref 11.5–15.5)
WBC: 9.9 10*3/uL (ref 4.0–10.5)
nRBC: 0.4 % — ABNORMAL HIGH (ref 0.0–0.2)

## 2023-06-25 LAB — GLUCOSE, CAPILLARY
Glucose-Capillary: 122 mg/dL — ABNORMAL HIGH (ref 70–99)
Glucose-Capillary: 85 mg/dL (ref 70–99)
Glucose-Capillary: 137 mg/dL — ABNORMAL HIGH (ref 70–99)

## 2023-06-25 MED ORDER — HEPARIN SODIUM (PORCINE) 1000 UNIT/ML IJ SOLN
INTRAMUSCULAR | Status: AC
  Filled 2023-06-25: qty 10

## 2023-06-25 MED ORDER — MIDODRINE HCL 5 MG PO TABS
ORAL_TABLET | ORAL | Status: AC
  Filled 2023-06-25: qty 2

## 2023-06-25 MED ORDER — HEPARIN SODIUM (PORCINE) 1000 UNIT/ML DIALYSIS
25.0000 [IU]/kg | INTRAMUSCULAR | Status: DC | PRN
  Administered 2023-06-25: 3100 [IU] via INTRAVENOUS_CENTRAL

## 2023-06-25 NOTE — Progress Notes (Signed)
[facility]  ROUNDING NOTE   Subjective:   Patient seen and evaluated during dialysis   HEMODIALYSIS FLOWSHEET:  Blood Flow Rate (mL/min): 300 mL/min Arterial Pressure (mmHg): -110 mmHg Venous Pressure (mmHg): 160 mmHg TMP (mmHg): -4 mmHg Ultrafiltration Rate (mL/min): 660 mL/min Dialysate Flow Rate (mL/min): 300 ml/min Dialysis Fluid Bolus: Normal Saline Bolus Amount (mL): 200 mL (DIALYZER FLUSH)  Hesitantly agreeable to extended UF only treatment today Standing weight 121.7kg  Objective:  Vital signs in last 24 hours:  Temp:  [96.5 F (35.8 C)-97.9 F (36.6 C)] 96.5 F (35.8 C) (07/24 0900) Pulse Rate:  [55-69] 69 (07/24 0900) Resp:  [12-19] 13 (07/24 1115) BP: (80-142)/(40-119) 107/60 (07/24 1115) SpO2:  [74 %-100 %] 99 % (07/24 1115) Weight:  [121.7 kg-122.5 kg] 121.7 kg (07/24 0903)  Weight change: 0.2 kg Filed Weights   06/24/23 0803 06/24/23 1138 06/25/23 0903  Weight: 123.3 kg 122.5 kg 121.7 kg    Intake/Output: I/O last 3 completed shifts: In: 150 [P.O.:150] Out: 701 [Other:700; Stool:1]   Intake/Output this shift:  No intake/output data recorded.  Physical Exam: General: NAD, laying in bed  Head: Normocephalic, atraumatic. Moist oral mucosal membranes  Eyes: Anicteric  Lungs:  Clear to auscultation, normal effort  Heart: Regular rate and rhythm  Abdomen:  Soft, nontender, edema  Extremities: no peripheral edema.  Neurologic: Alert and able to answer questions appropriately  Skin: No lesions  Access: Lt AVF    Basic Metabolic Panel: Recent Labs  Lab 06/19/23 0340 06/20/23 0530 06/21/23 0437 06/22/23 0547 06/23/23 0455 06/24/23 0805 06/25/23 0808  NA 129*   < > 132* 133* 135 135 135  K 4.3   < > 4.6 4.1 4.6 4.9 4.5  CL 95*   < > 96* 98 96* 97* 100  CO2 21*   < > 26 27 25 25 23   GLUCOSE 167*   < > 103* 114* 114* 127* 131*  BUN 75*   < > 71* 48* 60* 86* 62*  CREATININE 6.55*   < > 6.46* 4.62* 6.21* 7.56* 6.63*  CALCIUM  7.7*   < > 8.2* 8.4* 8.5* 8.2* 8.0*  MG 1.9  --   --   --   --   --   --   PHOS 4.4   < > 4.7* 3.8 5.2* 5.3* 4.0   < > = values in this interval not displayed.    Liver Function Tests: Recent Labs  Lab 06/21/23 0437 06/22/23 0547 06/23/23 0455 06/24/23 0805 06/25/23 0808  ALBUMIN 3.3* 3.0* 3.2* 3.1* 3.1*   No results for input(s): "LIPASE", "AMYLASE" in the last 168 hours.  No results for input(s): "AMMONIA" in the last 168 hours.  CBC: Recent Labs  Lab 06/21/23 0437 06/22/23 0547 06/23/23 0455 06/24/23 0805 06/25/23 0808  WBC 9.9 10.0 10.9* 10.3 9.9  HGB 9.3* 9.2* 9.8* 9.6* 10.1*  HCT 28.9* 29.3* 31.4* 31.4* 33.7*  MCV 101.0* 100.3* 100.0 103.0* 108.4*  PLT 145* 152 157 155 118*    Cardiac Enzymes: No results for input(s): "CKTOTAL", "CKMB", "CKMBINDEX", "TROPONINI" in the last 168 hours.  BNP: Invalid input(s): "POCBNP"  CBG: Recent Labs  Lab 06/24/23 0721 06/24/23 1215 06/24/23 1604 06/24/23 2127 06/25/23 0729  GLUCAP 114* 86 208* 112* 122*    Microbiology: Results for orders placed or performed during the hospital encounter of 06/07/23  Blood culture (routine x 2)     Status: None   Collection Time: 06/07/23 12:54 PM   Specimen: BLOOD  Result Value Ref Range Status   Specimen Description BLOOD RIGHT ANTECUBITAL  Final   Special Requests   Final    BOTTLES DRAWN AEROBIC AND ANAEROBIC Blood Culture adequate volume   Culture   Final    NO GROWTH 5 DAYS Performed at [facility], [address] [place], [place] [address]    Report Status 06/12/2023 FINAL  Final  Blood culture (routine x 2)     Status: None   Collection Time: 06/07/23 12:54 PM   Specimen: BLOOD  Result Value Ref Range Status   Specimen Description BLOOD BLOOD RIGHT FOREARM  Final   Special Requests   Final    BOTTLES DRAWN AEROBIC AND ANAEROBIC Blood Culture adequate volume   Culture   Final    NO GROWTH 5 DAYS Performed at [facility], [address], [place], [place] [address]    Report Status 06/12/2023 FINAL  Final  MRSA Next Gen by PCR, Nasal     Status: None   Collection Time: 06/07/23 10:18 PM   Specimen: Nasal Mucosa; Nasal Swab  Result Value Ref Range Status   MRSA by PCR Next Gen NOT DETECTED NOT DETECTED Final    Comment: (NOTE) The GeneXpert MRSA Assay (FDA approved for NASAL specimens only), is one component of a comprehensive MRSA colonization surveillance program. It is not intended to diagnose MRSA infection nor to guide or monitor treatment for MRSA infections. Test performance is not FDA approved in patients less than 58 years old. Performed at [facility], [address] [place], [place] [address]     Coagulation Studies: No results for input(s): "LABPROT", "INR" in the last 72 hours.  Urinalysis: No results for input(s): "COLORURINE", "LABSPEC", "PHURINE", "GLUCOSEU", "HGBUR", "BILIRUBINUR", "KETONESUR", "PROTEINUR", "UROBILINOGEN", "NITRITE", "LEUKOCYTESUR" in the last 72 hours.  Invalid input(s): "APPERANCEUR"    Imaging: No results found.   Medications:    sodium chloride Stopped (06/10/23 1900)   sodium chloride     anticoagulant sodium citrate      apixaban  5 mg Oral BID   droxidopa  100 mg Oral TID WC   epoetin (EPOGEN/PROCRIT) injection  10,000 Units Intravenous Q T,Th,Sa-HD   ezetimibe  10 mg Oral Daily   feeding supplement (NEPRO CARB STEADY)  237 mL Oral TID BM   fludrocortisone  0.2 mg Oral BID   insulin aspart  0-20 Units Subcutaneous TID WC   insulin aspart  0-5 Units Subcutaneous QHS   insulin glargine-yfgn  5 Units Subcutaneous Daily   loratadine  10 mg Oral Daily   midodrine  10 mg Oral TID WC   multivitamin  1 tablet Oral QHS   pregabalin  100 mg Oral QHS   pregabalin  50 mg Oral BID   rosuvastatin  10 mg Oral Daily   senna-docusate  1 tablet Oral BID   sodium chloride flush  3 mL Intravenous Q12H   sodium chloride, acetaminophen, alteplase, alum & mag  hydroxide-simeth, anticoagulant sodium citrate, bisacodyl, docusate sodium, guaiFENesin-dextromethorphan, heparin, heparin, HYDROcodone-acetaminophen, lidocaine (PF), lidocaine-prilocaine, morphine injection, nitroGLYCERIN, ondansetron (ZOFRAN) IV, mouth rinse, pentafluoroprop-tetrafluoroeth, polyethylene glycol, polyvinyl alcohol, sodium chloride flush  Assessment/ Plan:  [name] is a 77 y.o.  female  with a PMHx of ESRD on HD TTS at [facility], diabetes mellitus type 2, chronic hypotension, peripheral arterial disease, obstructive sleep apnea, GERD, obesity, hyperlipidemia, anemia chronic kidney disease, secondary hyperparathyroidism, who was admitted to [facility] on 06/07/2023 for evaluation of generalized weakness.  Found to have severe hypotension, pulmonary hypertension, atrial fibrillation and tricuspid valve regurgitation all contributing to hypotension.  TTS at [facility],  1  ESRD: on hemodialysis  - Patient receiving 5hr UF only treatment, UF goal 3L as tolerated.  - Next treatment scheduled for Thursday   2.  Chronic hypotension with severe pulmonary hypertension.  Systolic blood pressure usually in the 80s as an outpatient.  Weaned off pressors - midodrine 10 mg 3 times daily . Higher dose caused symptomatic bradycardia on dialysis treatment.  - fludrocortisone - droxidopa  - appreciate critical care and cardiology assistance.   3. Anemia of chronic kidney disease Lab Results  Component Value Date   HGB 10.1 (L) 06/25/2023  Hemoglobin at goal, may reduce EPO to low-dose with next treatment. - Hgb acceptable Continue Mircera as an outpatient.  4. Diabetes mellitus type II with chronic kidney disease/renal manifestations: noninsulin dependent.  Hemoglobin A1c is 5.8 on 06/07/23.  - continue glucose control.   5. Secondary Hyperparathyroidism:    Lab Results  Component Value Date   PTH 388.6 (H) 01/17/2014   CALCIUM 8.0 (L) 06/25/2023   CAION  0.92 (L) 06/11/2023   PHOS 4.0 06/25/2023   - Will continue to monitor bone minerals during this admission - Holding Auryxia and Sensipar     LOS: 18 Kandis Henry 7/24/202411:30 AM

## 2023-06-25 NOTE — Progress Notes (Addendum)
PT Cancellation Note  Patient Details Name: Jacqueline [name] MRN: [id number] DOB: 06-09-1946   Cancelled Treatment:   PT attempt. Pt off floor in HD. Incomplete HD treatment previous date. Acute PT will continue to follow and progress per current POC.      2nd attempt 1625: Pt recently return to room from HD. Endorses severe fatigue.  Will return tomorrow and continue to follow per current POC.   Rushie Chestnut 06/25/2023, 9:40 AM

## 2023-06-25 NOTE — Progress Notes (Signed)
  Received patient in bed to unit.   Informed consent signed and in chart.    TX duration: 5 hrs     Transported back to ICU  Hand-off given to patient's nurse. Pt c/o buttoks pain that subsides when pillow placed    Access used:  L AVF Access issues: none   Total UF removed: 3.0L Medication(s) given: 10mg  midodrine Post HD VS: 112/98 Post HD weight: 118.9kg     Lynann Beaver  [facility]

## 2023-06-25 NOTE — Progress Notes (Signed)
Progress Note    Jacqueline [name]  LKG:[id number] DOB: September 28, 1946  DOA: 06/07/2023 PCP: Macky Lower, MD      Brief Narrative:    Medical records reviewed and are as summarized below:  Jacqueline [name] is a 77 y.o. female with PMHx significant for HTN, chronic hypotension, type II DM, ESRD on HD TTS, Chronic debility, CAD s/p stent, PAF, OSA, PAD, Left knee arthritis, Chronic lower back pain, GERD, Obesity, HLD who presented to the ED from the dialysis center with generalized weakness and hypotension with BP of 72/42.  Reportedly, patient had nausea, vomiting, diarrhea and left lower quadrant abdominal pain the night prior to admission.  ED Course:  Initial vital signs showed HR of 50 beats/minute, BP 81/51 mm Hg, the RR 16 breaths/minute, and the oxygen saturation 91% on 2L and a temperature of 97.63F (36.6C).   Pertinent Labs/Diagnostics.  EKG:?Afib vs junctional bradycardia rate 40bmp. Incomplete RBB,   CXR, CT scan obtained and show no signs of infection or other abnormality. Patient given, midodrine, atropine for significant bradycardia and  500 cc of fluids and started on broad-spectrum antibiotics Vanco cefepime and Flagyl for  suspected sepsis of unknown source. Patient remained hypotensive despite  intervention as above. PCCM consulted for possible pressors.    Significant Hospital Events: Including procedures, antibiotic start and stop dates in addition to other pertinent events   06/07/2023: admit to ICU with bradycardia, started on dopamine 06/08/2023: weaned off dopamine, hypotensive again, dopamine restarted 06/09/2023:  Remains hypotensive requiring Levophed, (pt is vasculopath, doubt accuracy of cuff BP as pt is awake, alert, and asymptomatic).  Attempted Arterial line but unsuccessful, Vascular Surgery consulted to assist with placement. Nephrology consulted, received HD. 06/10/2023: Continues to require Levophed (6 mcg currently), when Levo weaned down does exhibit  some Bradycardia in the upper 50's.  Cardiology considering R Heart Cath today. Holding on transfer to [facility] for pacer at this time. 06/11/2023: Levophed weaning down, this AM. LHC/RHC with mild-moderate non-obstructive CAD, patent proximal RCA stent, mild-moderately elevated L heart filling pressures, severely elevated R heart/pulm artery pressures, mildly reduced CO/CI. Episode of chest pressure with HD, resolved with morphine. 06/12/2023: Additional episodes of chest tightness/pressure overnight (0150 and 0554). EKG without changes, trops flat. Hypertensive to 227/80, then with NE off HR dropped to 29.  Droxidopa was eventually stopped. 06/13/2023: No events overnight, no chest pain or hypertension. Remains on Levophed.  Decrease Midodrine to 5 mg TID and restart Droxidopa, increase Florinef and stop Solu-cortef in attempts to wean Levophed off. Obtain CT Chest to rule out Pulmonary pathology for etiology of PH. 06/14/2023: chest pain yesterday. Re-started on levophed over night. CT shows no acute finding of ILD. Droxidopa re-started yesterday. 06/15/2023: completed dialysis yesterday. Re-started again on levophed.  06/16/23- met with cardiology to discuss treatment options.  At this time patient remains severely hypotensive on maximal medical management. Her Renal impairment and hypotension limit therapy.  Recommendation for palliative care at this time due to poor prognosis. 06/17/23- patient remains hypotensive with poor prognosis. She is requiring less vasopressor support.  06/18/23- patient is off vasopressors.  Still has overall very poor prognosis.  Recommendation for palliative/hospice.    Interim timeframe since above events - hypotension remains an issue.  Patient on midodrine, Florinef and droxidopa with persistently soft BP's.   Assessment/Plan:   Principal Problem:   Symptomatic bradycardia Active Problems:   Hypotension   Permanent atrial fibrillation (HCC)   End stage renal  disease (HCC)   Bradycardia   Morbid obesity (HCC)   Severe pulmonary hypertension (HCC)   Goals of care, counseling/discussion    Body mass index is 46.05 kg/m.  (Morbid obesity)   Circulatory shock (suspected cardiogenic shock), acute on chronic hypotension:  Weaned off of IV Levophed drip on 06/16/2024. BP is still low and has been fluctuating.   -- Continue midodrine, fludrocortisone and droxidopa. BP's today range from 80/50 to 112/56   Persistent atrial fibrillation, bradycardia: Seen by EP cardiologist and she is deemed not to be a candidate for pacemaker at this time.  Not on any rate control medications because of bradycardia. --Continue Eliquis   CAD s/p RCA PCI, severe pulmonary hypertension, moderate to severe TR: S/p right and left heart cath on 06/11/2023 showed patent RCA stent, mid to moderate nonobstructive CAD, severely elevated right heart and pulmonary artery pressures.   ESRD on hemodialysis anemia of CKD:  Nephrology following for dialysis Ultrafiltration has been limited by hypotension  Hypokalemia: Improved   Chronic hyponatremia: Asymptomatic.  Monitor BMP   Type II DM: Continue Semglee and NovoLog as needed for hyperglycemia. Hemoglobin A1c was 5.8 on 06/07/2023   Thrombocytopenia: fluctuating platelet count but overall improved.   Other comorbidities include chronic debility, chronic low back pain, left knee arthritis    Per Dr. Myriam Forehand 7/23: "Plan of care was discussed with [facility] (at [phone]).  Diagnoses and prognosis were discussed.  All her questions were answered.  She was concerned about patient losing her bed at [facility].  She requested to speak to the case manager.  I asked Dasha, case manager, to give her a call."    Diet Order             Diet Carb Modified Fluid consistency: Thin; Room service appropriate? Yes; Fluid restriction: 1200 mL Fluid  Diet effective now                             Consultants: Cardiologist Intensivist Nephrologist Palliative care  Procedures: None    Medications:    apixaban  5 mg Oral BID   droxidopa  100 mg Oral TID WC   epoetin (EPOGEN/PROCRIT) injection  10,000 Units Intravenous Q T,Th,Sa-HD   ezetimibe  10 mg Oral Daily   feeding supplement (NEPRO CARB STEADY)  237 mL Oral TID BM   fludrocortisone  0.2 mg Oral BID   insulin aspart  0-20 Units Subcutaneous TID WC   insulin aspart  0-5 Units Subcutaneous QHS   insulin glargine-yfgn  5 Units Subcutaneous Daily   loratadine  10 mg Oral Daily   midodrine  10 mg Oral TID WC   multivitamin  1 tablet Oral QHS   pregabalin  100 mg Oral QHS   pregabalin  50 mg Oral BID   rosuvastatin  10 mg Oral Daily   senna-docusate  1 tablet Oral BID   sodium chloride flush  3 mL Intravenous Q12H   Continuous Infusions:  sodium chloride Stopped (06/10/23 1900)   sodium chloride     anticoagulant sodium citrate       Anti-infectives (From admission, onward)    Start     Dose/Rate Route Frequency Ordered Stop   06/10/23 1200  vancomycin (VANCOCIN) IVPB 1000 mg/200 mL premix  Status:  Discontinued       Placed in "Followed by" Linked Group   1,000 mg 200 mL/hr over 60 Minutes Intravenous Every T-Th-Sa (Hemodialysis) 06/08/23  1058 06/09/23 1029   06/08/23 1145  vancomycin (VANCOCIN) IVPB 1000 mg/200 mL premix       Placed in "Followed by" Linked Group   1,000 mg 200 mL/hr over 60 Minutes Intravenous  Once 06/08/23 1058 06/08/23 1655   06/08/23 1100  piperacillin-tazobactam (ZOSYN) IVPB 2.25 g  Status:  Discontinued        2.25 g 100 mL/hr over 30 Minutes Intravenous Every 8 hours 06/08/23 1058 06/09/23 1029   06/07/23 1400  metroNIDAZOLE (FLAGYL) IVPB 500 mg        500 mg 100 mL/hr over 60 Minutes Intravenous  Once 06/07/23 1346 06/07/23 1652   06/07/23 1400  ceFEPIme (MAXIPIME) 2 g in sodium chloride 0.9 % 100 mL IVPB        2 g 200 mL/hr over 30 Minutes Intravenous   Once 06/07/23 1349 06/07/23 1512   06/07/23 1400  vancomycin (VANCOCIN) IVPB 1000 mg/200 mL premix        1,000 mg 200 mL/hr over 60 Minutes Intravenous  Once 06/07/23 1349 06/07/23 1652              Family Communication/Anticipated D/C date and plan/Code Status   DVT prophylaxis: SCDs Start: 06/11/23 1358 apixaban (ELIQUIS) tablet 5 mg     Code Status: Full Code  Family Communication: None Disposition Plan: Plan to discharge to SNF   Status is: Inpatient Remains inpatient appropriate because: Bradycardia, hypotension       Subjective:   Pt seen in dialysis.  No acute complaints or events reported.  They are trying to slowly pull off fluid today, pt apprehensive about it.   Objective:     Vitals:   06/25/23 1330 06/25/23 1400 06/25/23 1423 06/25/23 1430  BP: (!) 103/48 103/63 (!) 112/98 (!) 110/57  Pulse:  (!) 43 (!) 53   Resp: 16 14 17 14   Temp:      TempSrc:      SpO2: 97% 99% 98% 97%  Weight:      Height:       No data found.   Intake/Output Summary (Last 24 hours) at 06/25/2023 1450 Last data filed at 06/25/2023 1423 Gross per 24 hour  Intake --  Output 3001 ml  Net -3001 ml   Filed Weights   06/24/23 0803 06/24/23 1138 06/25/23 0903  Weight: 123.3 kg 122.5 kg 121.7 kg    Exam:  GEN: NAD SKIN: Warm and dry EYES: No pallor or icterus ENT: MMM CV: RRR PULM: CTA B ABD: soft, obese, NT, +BS CNS: AAO x 3, non focal EXT: No edema or tenderness    Data Reviewed:   I have personally reviewed following labs and imaging studies:  Labs: Labs show the following:   Basic Metabolic Panel: Recent Labs  Lab 06/19/23 0340 06/20/23 0530 06/21/23 0437 06/22/23 0547 06/23/23 0455 06/24/23 0805 06/25/23 0808  NA 129*   < > 132* 133* 135 135 135  K 4.3   < > 4.6 4.1 4.6 4.9 4.5  CL 95*   < > 96* 98 96* 97* 100  CO2 21*   < > 26 27 25 25 23   GLUCOSE 167*   < > 103* 114* 114* 127* 131*  BUN 75*   < > 71* 48* 60* 86* 62*  CREATININE  6.55*   < > 6.46* 4.62* 6.21* 7.56* 6.63*  CALCIUM 7.7*   < > 8.2* 8.4* 8.5* 8.2* 8.0*  MG 1.9  --   --   --   --   --   --  PHOS 4.4   < > 4.7* 3.8 5.2* 5.3* 4.0   < > = values in this interval not displayed.   GFR Estimated Creatinine Clearance: 8.9 mL/min (A) (by C-G formula based on SCr of 6.63 mg/dL (H)). Liver Function Tests: Recent Labs  Lab 06/21/23 0437 06/22/23 0547 06/23/23 0455 06/24/23 0805 06/25/23 0808  AST  --   --   --   --  28  ALT  --   --   --   --  25  ALKPHOS  --   --   --   --  46  BILITOT  --   --   --   --  0.5  PROT  --   --   --   --  6.7  ALBUMIN 3.3* 3.0* 3.2* 3.1* 3.2*  3.1*   No results for input(s): "LIPASE", "AMYLASE" in the last 168 hours. No results for input(s): "AMMONIA" in the last 168 hours. Coagulation profile No results for input(s): "INR", "PROTIME" in the last 168 hours.  CBC: Recent Labs  Lab 06/21/23 0437 06/22/23 0547 06/23/23 0455 06/24/23 0805 06/25/23 0808  WBC 9.9 10.0 10.9* 10.3 9.9  HGB 9.3* 9.2* 9.8* 9.6* 10.1*  HCT 28.9* 29.3* 31.4* 31.4* 33.7*  MCV 101.0* 100.3* 100.0 103.0* 108.4*  PLT 145* 152 157 155 118*   Cardiac Enzymes: No results for input(s): "CKTOTAL", "CKMB", "CKMBINDEX", "TROPONINI" in the last 168 hours. BNP (last 3 results) No results for input(s): "PROBNP" in the last 8760 hours. CBG: Recent Labs  Lab 06/24/23 0721 06/24/23 1215 06/24/23 1604 06/24/23 2127 06/25/23 0729  GLUCAP 114* 86 208* 112* 122*   D-Dimer: No results for input(s): "DDIMER" in the last 72 hours. Hgb A1c: No results for input(s): "HGBA1C" in the last 72 hours. Lipid Profile: No results for input(s): "CHOL", "HDL", "LDLCALC", "TRIG", "CHOLHDL", "LDLDIRECT" in the last 72 hours. Thyroid function studies: No results for input(s): "TSH", "T4TOTAL", "T3FREE", "THYROIDAB" in the last 72 hours.  Invalid input(s): "FREET3" Anemia work up: No results for input(s): "VITAMINB12", "FOLATE", "FERRITIN", "TIBC", "IRON",  "RETICCTPCT" in the last 72 hours. Sepsis Labs: Recent Labs  Lab 06/22/23 0547 06/23/23 0455 06/24/23 0805 06/25/23 0808  WBC 10.0 10.9* 10.3 9.9    Microbiology No results found for this or any previous visit (from the past 240 hour(s)).  Procedures and diagnostic studies:  No results found.             LOS: 18 days   Pennie Banter, DO  [facility]   Pager on [email or web address]. If 7PM-7AM, please contact night-coverage at [email or web address]     06/25/2023, 2:50 PM

## 2023-06-25 NOTE — Progress Notes (Signed)
OT Cancellation Note  Patient Details Name: SHIRLEE WHITMIRE MRN: [id number] DOB: [birth year]   Cancelled Treatment:    Reason Eval/Treat Not Completed: Patient at procedure or test/ unavailable. Pt noted to be off the floor for HD, unavailable at this time. Will continue to follow POC at later date/time as pt available.   Kathie Dike, M.S. OTR/L  06/25/23, 3:24 PM  ascom [phone]

## 2023-06-25 NOTE — Progress Notes (Signed)
  Received patient in bed to unit.   Informed consent signed and in chart.    TX duration: 5 hrs     Hand-off given to patient's nurse. Charlene Corn   Access used: L UA AVF Access issues: None   Total UF removed: 3000 mL Medication(s) given: None Post HD VS: 110/57 BP, 47 HR, 14 RR, 96.7 T Post HD weight: 118.9 kg     [facility]

## 2023-06-25 NOTE — Plan of Care (Signed)
Patient alert x4 on 3L [place]. Patient total care with staff decrease in motivation to get out of bed unless she's needs to use bedside commode. Patient BP soft.  Problem: Education: Goal: Knowledge of General Education information will improve Description: Including pain rating scale, medication(s)/side effects and non-pharmacologic comfort measures Outcome: Not Progressing   Problem: Health Behavior/Discharge Planning: Goal: Ability to manage health-related needs will improve Outcome: Not Progressing   Problem: Clinical Measurements: Goal: Ability to maintain clinical measurements within normal limits will improve Outcome: Progressing Goal: Will remain free from infection Outcome: Progressing Goal: Diagnostic test results will improve Outcome: Progressing Goal: Respiratory complications will improve Outcome: Not Progressing Goal: Cardiovascular complication will be avoided Outcome: Not Progressing   Problem: Activity: Goal: Risk for activity intolerance will decrease Outcome: Not Progressing   Problem: Nutrition: Goal: Adequate nutrition will be maintained Outcome: Not Progressing   Problem: Coping: Goal: Level of anxiety will decrease Outcome: Not Progressing   Problem: Elimination: Goal: Will not experience complications related to bowel motility Outcome: Progressing Goal: Will not experience complications related to urinary retention Outcome: Not Progressing   Problem: Pain Managment: Goal: General experience of comfort will improve Outcome: Not Progressing   Problem: Safety: Goal: Ability to remain free from injury will improve Outcome: Not Progressing   Problem: Skin Integrity: Goal: Risk for impaired skin integrity will decrease Outcome: Not Progressing   Problem: Education: Goal: Knowledge of General Education information will improve Description: Including pain rating scale, medication(s)/side effects and non-pharmacologic comfort measures Outcome: Not  Progressing   Problem: Health Behavior/Discharge Planning: Goal: Ability to manage health-related needs will improve Outcome: Not Progressing   Problem: Clinical Measurements: Goal: Will remain free from infection Outcome: Not Progressing Goal: Respiratory complications will improve Outcome: Not Progressing   Problem: Coping: Goal: Level of anxiety will decrease Outcome: Not Progressing   Problem: Elimination: Goal: Will not experience complications related to bowel motility Outcome: Progressing   Problem: Education: Goal: Understanding of CV disease, CV risk reduction, and recovery process will improve Outcome: Not Progressing Goal: Individualized Educational Video(s) Outcome: Not Progressing   Problem: Activity: Goal: Ability to return to baseline activity level will improve Outcome: Not Progressing   Problem: Cardiovascular: Goal: Ability to achieve and maintain adequate cardiovascular perfusion will improve Outcome: Not Progressing Goal: Vascular access site(s) Level 0-1 will be maintained Outcome: Not Progressing   Problem: Health Behavior/Discharge Planning: Goal: Ability to safely manage health-related needs after discharge will improve Outcome: Not Progressing

## 2023-06-25 NOTE — TOC Benefit Eligibility Note (Signed)
Pharmacy Patient Advocate Encounter  Insurance verification completed.    The patient is insured through [facility] Medicare Part D  Ran test claim for droxidopa (Northera) 100 mg capsules and Requires Prior Authorization   This test claim was processed through [facility]- copay amounts may vary at other pharmacies due to [employer], or as the patient moves through the different stages of their insurance plan.    Jacqueline [name], CPHT Pharmacy Patient Advocate Specialist [facility] Patient Advocate Team Direct Number: [phone]  Fax: [phone]

## 2023-06-26 ENCOUNTER — Telehealth (HOSPITAL_COMMUNITY): Payer: Self-pay | Admitting: Pharmacy Technician

## 2023-06-26 ENCOUNTER — Inpatient Hospital Stay: Payer: Medicare Other

## 2023-06-26 ENCOUNTER — Other Ambulatory Visit (HOSPITAL_COMMUNITY): Payer: Self-pay

## 2023-06-26 DIAGNOSIS — R001 Bradycardia, unspecified: Secondary | ICD-10-CM | POA: Diagnosis not present

## 2023-06-26 LAB — CBC
HCT: 34.4 % — ABNORMAL LOW (ref 36.0–46.0)
MCH: 31.9 pg (ref 26.0–34.0)
MCHC: 30.5 g/dL (ref 30.0–36.0)
Platelets: 153 10*3/uL (ref 150–400)
RBC: 3.29 MIL/uL — ABNORMAL LOW (ref 3.87–5.11)
WBC: 11.9 10*3/uL — ABNORMAL HIGH (ref 4.0–10.5)
nRBC: 0.3 % — ABNORMAL HIGH (ref 0.0–0.2)

## 2023-06-26 LAB — GLUCOSE, CAPILLARY
Glucose-Capillary: 73 mg/dL (ref 70–99)
Glucose-Capillary: 139 mg/dL — ABNORMAL HIGH (ref 70–99)

## 2023-06-26 LAB — RENAL FUNCTION PANEL
Anion gap: 14 (ref 5–15)
Calcium: 8 mg/dL — ABNORMAL LOW (ref 8.9–10.3)
Chloride: 100 mmol/L (ref 98–111)
Creatinine, Ser: 7.67 mg/dL — ABNORMAL HIGH (ref 0.44–1.00)
GFR, Estimated: 5 mL/min — ABNORMAL LOW (ref >60)
Glucose, Bld: 102 mg/dL — ABNORMAL HIGH (ref 70–99)
Potassium: 4.8 mmol/L (ref 3.5–5.1)

## 2023-06-26 MED ORDER — EPOETIN ALFA 4000 UNIT/ML IJ SOLN
4000.0000 [IU] | INTRAMUSCULAR | Status: DC
  Administered 2023-06-26 – 2023-07-01 (×3): 4000 [IU] via INTRAVENOUS
  Filled 2023-06-26 (×2): qty 1

## 2023-06-26 MED ORDER — EPOETIN ALFA 4000 UNIT/ML IJ SOLN
INTRAMUSCULAR | Status: AC
  Filled 2023-06-26: qty 1

## 2023-06-26 MED ORDER — DROXIDOPA 100 MG PO CAPS
200.0000 mg | ORAL_CAPSULE | Freq: Three times a day (TID) | ORAL | Status: DC
  Administered 2023-06-26 – 2023-07-01 (×16): 200 mg via ORAL
  Filled 2023-06-26 (×16): qty 2

## 2023-06-26 MED ORDER — MIDODRINE HCL 5 MG PO TABS
ORAL_TABLET | ORAL | Status: AC
  Filled 2023-06-26: qty 2

## 2023-06-26 NOTE — Progress Notes (Signed)
Progress Note    Jacqueline [name]  QMV:[id number] DOB: [birth year]  DOA: 06/07/2023 PCP: Macky Lower, MD      Brief Narrative:    Medical records reviewed and are as summarized below:  Jacqueline [name] is a 77 y.o. female with PMHx significant for HTN, chronic hypotension, type II DM, ESRD on HD TTS, Chronic debility, CAD s/p stent, PAF, OSA, PAD, Left knee arthritis, Chronic lower back pain, GERD, Obesity, HLD who presented to the ED from the dialysis center with generalized weakness and hypotension with BP of 72/42.  Reportedly, patient had nausea, vomiting, diarrhea and left lower quadrant abdominal pain the night prior to admission.  ED Course:  Initial vital signs showed HR of 50 beats/minute, BP 81/51 mm Hg, the RR 16 breaths/minute, and the oxygen saturation 91% on 2L and a temperature of 97.32F (36.6C).   Pertinent Labs/Diagnostics.  EKG:?Afib vs junctional bradycardia rate 40bmp. Incomplete RBB,   CXR, CT scan obtained and show no signs of infection or other abnormality. Patient given, midodrine, atropine for significant bradycardia and  500 cc of fluids and started on broad-spectrum antibiotics Vanco cefepime and Flagyl for  suspected sepsis of unknown source. Patient remained hypotensive despite  intervention as above. PCCM consulted for possible pressors.    Significant Hospital Events: Including procedures, antibiotic start and stop dates in addition to other pertinent events   06/07/2023: admit to ICU with bradycardia, started on dopamine 06/08/2023: weaned off dopamine, hypotensive again, dopamine restarted 06/09/2023:  Remains hypotensive requiring Levophed, (pt is vasculopath, doubt accuracy of cuff BP as pt is awake, alert, and asymptomatic).  Attempted Arterial line but unsuccessful, Vascular Surgery consulted to assist with placement. Nephrology consulted, received HD. 06/10/2023: Continues to require Levophed (6 mcg currently), when Levo weaned down does exhibit  some Bradycardia in the upper 50's.  Cardiology considering R Heart Cath today. Holding on transfer to [facility] for pacer at this time. 06/11/2023: Levophed weaning down, this AM. LHC/RHC with mild-moderate non-obstructive CAD, patent proximal RCA stent, mild-moderately elevated L heart filling pressures, severely elevated R heart/pulm artery pressures, mildly reduced CO/CI. Episode of chest pressure with HD, resolved with morphine. 06/12/2023: Additional episodes of chest tightness/pressure overnight (0150 and 0554). EKG without changes, trops flat. Hypertensive to 227/80, then with NE off HR dropped to 29.  Droxidopa was eventually stopped. 06/13/2023: No events overnight, no chest pain or hypertension. Remains on Levophed.  Decrease Midodrine to 5 mg TID and restart Droxidopa, increase Florinef and stop Solu-cortef in attempts to wean Levophed off. Obtain CT Chest to rule out Pulmonary pathology for etiology of PH. 06/14/2023: chest pain yesterday. Re-started on levophed over night. CT shows no acute finding of ILD. Droxidopa re-started yesterday. 06/15/2023: completed dialysis yesterday. Re-started again on levophed.  06/16/23- met with cardiology to discuss treatment options.  At this time patient remains severely hypotensive on maximal medical management. Her Renal impairment and hypotension limit therapy.  Recommendation for palliative care at this time due to poor prognosis. 06/17/23- patient remains hypotensive with poor prognosis. She is requiring less vasopressor support.  06/18/23- patient is off vasopressors.  Still has overall very poor prognosis.  Recommendation for palliative/hospice.    Interim timeframe since above events - hypotension remains an issue.  Patient on midodrine, Florinef and droxidopa with persistently soft BP's.   Assessment/Plan:   Principal Problem:   Symptomatic bradycardia Active Problems:   Hypotension   Permanent atrial fibrillation (HCC)   End stage renal  disease (HCC)   Bradycardia   Morbid obesity (HCC)   Severe pulmonary hypertension (HCC)   Goals of care, counseling/discussion    Body mass index is 44.88 kg/m.  (Morbid obesity)   Circulatory shock (suspected cardiogenic shock) Acute on chronic hypotension:  Dysautonomia Weaned off of IV Levophed drip on 06/16/2024. BP is still low and has been fluctuating.   -- Continue midodrine, fludrocortisone and droxidopa.  BP's today range from 83/55 to 104/63  Discussed droxidopa with clinical pharmacist and cardiologist.   It was started with intent to titrate dose up, with hopes to wean down midodrine dose. --Increase droxidopa 100 >> 200 mg BID --Pt should be upright as much as possible for droxidopa to be effective -- OOB to chair --Try compression stockings, thigh-high if pt will tolerate   Persistent atrial fibrillation, bradycardia: Seen by EP cardiologist and she is deemed not to be a candidate for pacemaker at this time.  Not on any rate control medications because of bradycardia. --Continue Eliquis   CAD s/p RCA PCI, severe pulmonary hypertension, moderate to severe TR: S/p right and left heart cath on 06/11/2023 showed patent RCA stent, mid to moderate nonobstructive CAD, severely elevated right heart and pulmonary artery pressures.   ESRD on hemodialysis anemia of CKD:  Nephrology following for dialysis Ultrafiltration has been limited by hypotension   Left leg pain (popliteal fossa) -- pt on Eliquis, unlikely DVT but will evaluate.  Baker's cyst also in differential. --US doppler of LLE ordered - follow results  Hypokalemia: Improved   Chronic hyponatremia: Asymptomatic.  Monitor BMP   Type II DM: Continue Semglee and NovoLog as needed for hyperglycemia. Hemoglobin A1c was 5.8 on 06/07/2023   Thrombocytopenia: fluctuating platelet count but overall improved.   Other comorbidities include chronic debility, chronic low back pain, left knee arthritis    Per  Dr. Myriam Forehand 7/23: "Plan of care was discussed with [facility] (at [phone]).  Diagnoses and prognosis were discussed.  All her questions were answered.  She was concerned about patient losing her bed at [facility].  She requested to speak to the case manager.  I asked Dasha, case manager, to give her a call."    Diet Order             Diet Carb Modified Fluid consistency: Thin; Room service appropriate? Yes; Fluid restriction: 1200 mL Fluid  Diet effective now                      Consultants: Cardiologist Intensivist Nephrologist Palliative care  Procedures: None    Medications:    apixaban  5 mg Oral BID   droxidopa  100 mg Oral TID WC   epoetin (EPOGEN/PROCRIT) injection  4,000 Units Intravenous Q T,Th,Sa-HD   ezetimibe  10 mg Oral Daily   feeding supplement (NEPRO CARB STEADY)  237 mL Oral TID BM   fludrocortisone  0.2 mg Oral BID   insulin aspart  0-20 Units Subcutaneous TID WC   insulin aspart  0-5 Units Subcutaneous QHS   insulin glargine-yfgn  5 Units Subcutaneous Daily   loratadine  10 mg Oral Daily   midodrine  10 mg Oral TID WC   multivitamin  1 tablet Oral QHS   pregabalin  100 mg Oral QHS   pregabalin  50 mg Oral BID   rosuvastatin  10 mg Oral Daily   senna-docusate  1 tablet Oral BID   sodium chloride flush  3 mL Intravenous Q12H  Continuous Infusions:  sodium chloride Stopped (06/10/23 1900)   sodium chloride     anticoagulant sodium citrate       Anti-infectives (From admission, onward)    Start     Dose/Rate Route Frequency Ordered Stop   06/10/23 1200  vancomycin (VANCOCIN) IVPB 1000 mg/200 mL premix  Status:  Discontinued       Placed in "Followed by" Linked Group   1,000 mg 200 mL/hr over 60 Minutes Intravenous Every T-Th-Sa (Hemodialysis) 06/08/23 1058 06/09/23 1029   06/08/23 1145  vancomycin (VANCOCIN) IVPB 1000 mg/200 mL premix       Placed in "Followed by" Linked Group   1,000 mg 200 mL/hr over 60 Minutes Intravenous   Once 06/08/23 1058 06/08/23 1655   06/08/23 1100  piperacillin-tazobactam (ZOSYN) IVPB 2.25 g  Status:  Discontinued        2.25 g 100 mL/hr over 30 Minutes Intravenous Every 8 hours 06/08/23 1058 06/09/23 1029   06/07/23 1400  metroNIDAZOLE (FLAGYL) IVPB 500 mg        500 mg 100 mL/hr over 60 Minutes Intravenous  Once 06/07/23 1346 06/07/23 1652   06/07/23 1400  ceFEPIme (MAXIPIME) 2 g in sodium chloride 0.9 % 100 mL IVPB        2 g 200 mL/hr over 30 Minutes Intravenous  Once 06/07/23 1349 06/07/23 1512   06/07/23 1400  vancomycin (VANCOCIN) IVPB 1000 mg/200 mL premix        1,000 mg 200 mL/hr over 60 Minutes Intravenous  Once 06/07/23 1349 06/07/23 1652              Family Communication/Anticipated D/C date and plan/Code Status   DVT prophylaxis: SCDs Start: 06/11/23 1358 apixaban (ELIQUIS) tablet 5 mg     Code Status: Full Code  Family Communication: None Disposition Plan: Plan to discharge to SNF   Status is: Inpatient Remains inpatient appropriate because: Bradycardia, hypotension       Subjective:   Pt seen in dialysis.  She reports pain behind her right knee and asks if all the blood we're drawing every day can cause blood clots.  She does say she takes blood thinners.  Tolerating dialysis okay.  No other acute complaints.    Objective:     Vitals:   06/26/23 1241 06/26/23 1300 06/26/23 1330 06/26/23 1357  BP: (!) 90/50 (!) 100/55 (!) 97/46 (!) 95/55  Pulse:  (!) 52 62 60  Resp:  20 16 14   Temp:    98.2 F (36.8 C)  TempSrc:    Oral  SpO2:  99% 98% 98%  Weight:      Height:       No data found.   Intake/Output Summary (Last 24 hours) at 06/26/2023 1411 Last data filed at 06/26/2023 1357 Gross per 24 hour  Intake 170 ml  Output 4500 ml  Net -4330 ml   Filed Weights   06/25/23 1453 06/26/23 0500 06/26/23 0951  Weight: 118.9 kg 121.2 kg 118.6 kg    Exam:  General exam: awake, alert, no acute distress, obese HEENT: moist mucus  membranes, hearing grossly normal  Respiratory system: CTAB diminished bases, no wheezes, rales or rhonchi, normal respiratory effort. Cardiovascular system: normal S1/S2, RRR Gastrointestinal system: soft, NT, ND Central nervous system: A&O x 3. no gross focal neurologic deficits, normal speech Extremities: tenderness on palpation of left posterior knee in popliteal fossa, none on right Skin: dry, intact, normal temperature Psychiatry: normal mood, congruent affect, judgement and insight appear normal  Data Reviewed:   I have personally reviewed following labs and imaging studies:  Labs: Labs show the following:   Basic Metabolic Panel: Recent Labs  Lab 06/22/23 0547 06/23/23 0455 06/24/23 0805 06/25/23 0808 06/26/23 0425  NA 133* 135 135 135 138  K 4.1 4.6 4.9 4.5 4.8  CL 98 96* 97* 100 100  CO2 27 25 25 23 24   GLUCOSE 114* 114* 127* 131* 102*  BUN 48* 60* 86* 62* 73*  CREATININE 4.62* 6.21* 7.56* 6.63* 7.67*  CALCIUM 8.4* 8.5* 8.2* 8.0* 8.0*  PHOS 3.8 5.2* 5.3* 4.0 5.6*   GFR Estimated Creatinine Clearance: 7.5 mL/min (A) (by C-G formula based on SCr of 7.67 mg/dL (H)). Liver Function Tests: Recent Labs  Lab 06/22/23 0547 06/23/23 0455 06/24/23 0805 06/25/23 0808 06/26/23 0425  AST  --   --   --  28  --   ALT  --   --   --  25  --   ALKPHOS  --   --   --  46  --   BILITOT  --   --   --  0.5  --   PROT  --   --   --  6.7  --   ALBUMIN 3.0* 3.2* 3.1* 3.2*  3.1* 3.3*   No results for input(s): "LIPASE", "AMYLASE" in the last 168 hours. No results for input(s): "AMMONIA" in the last 168 hours. Coagulation profile No results for input(s): "INR", "PROTIME" in the last 168 hours.  CBC: Recent Labs  Lab 06/22/23 0547 06/23/23 0455 06/24/23 0805 06/25/23 0808 06/26/23 0425  WBC 10.0 10.9* 10.3 9.9 11.9*  HGB 9.2* 9.8* 9.6* 10.1* 10.5*  HCT 29.3* 31.4* 31.4* 33.7* 34.4*  MCV 100.3* 100.0 103.0* 108.4* 104.6*  PLT 152 157 155 118* 153   Cardiac  Enzymes: No results for input(s): "CKTOTAL", "CKMB", "CKMBINDEX", "TROPONINI" in the last 168 hours. BNP (last 3 results) No results for input(s): "PROBNP" in the last 8760 hours. CBG: Recent Labs  Lab 06/24/23 2127 06/25/23 0729 06/25/23 1546 06/25/23 2145 06/26/23 0751  GLUCAP 112* 122* 85 137* 73   D-Dimer: No results for input(s): "DDIMER" in the last 72 hours. Hgb A1c: No results for input(s): "HGBA1C" in the last 72 hours. Lipid Profile: No results for input(s): "CHOL", "HDL", "LDLCALC", "TRIG", "CHOLHDL", "LDLDIRECT" in the last 72 hours. Thyroid function studies: No results for input(s): "TSH", "T4TOTAL", "T3FREE", "THYROIDAB" in the last 72 hours.  Invalid input(s): "FREET3" Anemia work up: No results for input(s): "VITAMINB12", "FOLATE", "FERRITIN", "TIBC", "IRON", "RETICCTPCT" in the last 72 hours. Sepsis Labs: Recent Labs  Lab 06/23/23 0455 06/24/23 0805 06/25/23 0808 06/26/23 0425  WBC 10.9* 10.3 9.9 11.9*    Microbiology No results found for this or any previous visit (from the past 240 hour(s)).  Procedures and diagnostic studies:  No results found.       LOS: 19 days   Pennie Banter, DO  [facility]   Pager on [email or web address]. If 7PM-7AM, please contact night-coverage at [email or web address]  06/26/2023, 2:11 PM

## 2023-06-26 NOTE — Progress Notes (Signed)
PT Cancellation Note  Patient Details Name: Jacqueline [name] MRN: [id number] DOB: 04-03-1946   Cancelled Treatment:     PT attempt. Pt is again off floor for HD. Will continue our current POC and return when pt is available to participate.   Rushie Chestnut 06/26/2023, 10:07 AM

## 2023-06-26 NOTE — Telephone Encounter (Signed)
Pharmacy Patient Advocate Encounter   Received notification that prior authorization for Droxidopa 100MG  capsules is required/requested.   Insurance verification completed.   The patient is insured through [facility] .   Per test claim: PA required; PA submitted to [facility] via CoverMyMeds Key/confirmation #/EOC [id number] Status is pending

## 2023-06-26 NOTE — TOC Progression Note (Signed)
Transition of Care [facility]) - Progression Note    Patient Details  Name: Jacqueline [name] MRN: [id number] Date of Birth: 1946/01/19  [facility] of [facility]) CM/SW Contact  Kreg Shropshire, RN Phone Number: 06/26/2023, 8:46 AM  Clinical Narrative:     Cm spoke with Tammy of [facility] and she stated they still have bed available at this time. Cm continue to follow for toc needs  Expected Discharge Plan: Long Term Nursing Home Barriers to Discharge: Continued Medical Work up  Expected Discharge Plan and Services       Living arrangements for the past 2 months: Skilled Nursing Facility Expected Discharge Date: 06/10/23                                     Social Determinants of Health (SDOH) Interventions SDOH Screenings   Food Insecurity: No Food Insecurity (06/07/2023)  Housing: Low Risk  (06/07/2023)  Transportation Needs: No Transportation Needs (06/07/2023)  Utilities: Not At Risk (06/07/2023)  Depression (PHQ2-9): Low Risk  (06/27/2021)  Financial Resource Strain: Medium Risk (06/27/2021)  Physical Activity: Inactive (06/27/2021)  Stress: No Stress Concern Present (06/27/2021)  Tobacco Use: Medium Risk (06/07/2023)    Readmission Risk Interventions    05/22/2022   12:26 PM  Readmission Risk Prevention Plan  Transportation Screening Complete  PCP or Specialist Appt within 3-5 Days Complete  HRI or Home Care Consult Complete  Social Work Consult for Recovery Care Planning/Counseling Complete  Palliative Care Screening Complete  Medication Review Oceanographer) Complete

## 2023-06-26 NOTE — Plan of Care (Signed)
  Problem: Education: Goal: Knowledge of General Education information will improve Description: Including pain rating scale, medication(s)/side effects and non-pharmacologic comfort measures Outcome: Progressing   Problem: Clinical Measurements: Goal: Ability to maintain clinical measurements within normal limits will improve Outcome: Progressing Goal: Will remain free from infection Outcome: Progressing Goal: Diagnostic test results will improve Outcome: Progressing Goal: Respiratory complications will improve Outcome: Progressing Goal: Cardiovascular complication will be avoided Outcome: Progressing   Problem: Activity: Goal: Risk for activity intolerance will decrease Outcome: Progressing   Problem: Nutrition: Goal: Adequate nutrition will be maintained Outcome: Progressing   Problem: Coping: Goal: Level of anxiety will decrease Outcome: Progressing   Problem: Safety: Goal: Ability to remain free from injury will improve Outcome: Progressing   Problem: Skin Integrity: Goal: Risk for impaired skin integrity will decrease Outcome: Progressing   Problem: Education: Goal: Knowledge of General Education information will improve Description: Including pain rating scale, medication(s)/side effects and non-pharmacologic comfort measures Outcome: Progressing

## 2023-06-26 NOTE — Progress Notes (Signed)
OT Cancellation Note  Patient Details Name: Jacqueline [name] MRN: [id number] DOB: 12/13/1945   Cancelled Treatment:    Reason Eval/Treat Not Completed: Patient at procedure or test/ unavailable. Pt is again off floor for HD. Will continue our current POC and return when pt is available to participate.   Kathie Dike, M.S. OTR/L  06/26/23, 3:25 PM  ascom [phone]

## 2023-06-26 NOTE — Progress Notes (Signed)
Hemodialysis note  Received patient in bed to unit. Alert and oriented.  Informed consent signed and in chart.  Treatment initiated: 1017 Treatment completed: 1357  Patient tolerated well. Transported back to room, alert without acute distress.  Report given to patient's RN.   Access used: LUA AVF Access issues: none  Total UF removed: 1.5 L Medication(s) given:  Midodrine 10 mg tab PO, Epogen 4000 units IV, Droxidopa 100 mg tab PO  Post HD weight: 118 kg   Wolfgang Phoenix Shreya [facility]

## 2023-06-26 NOTE — Telephone Encounter (Signed)
Pharmacy Patient Advocate Encounter  Received notification from [facility] that Prior Authorization for Droxidopa 100MG  capsules  has been APPROVED from 06/26/2023 to 07/26/2023. Ran test claim, Copay is $0.00.  PA #/Case ID/Reference #: [id number]

## 2023-06-26 NOTE — Progress Notes (Signed)
[facility]  ROUNDING NOTE   Subjective:   Patient seen sitting up in bed, eating breakfast States she feels well Denies nausea or vomiting  Tolerated dialysis well yesterday, UF 3 L Remains on 2 L nasal cannula  Objective:  Vital signs in last 24 hours:  Temp:  [96.7 F (35.9 C)-98.8 F (37.1 C)] 97.6 F (36.4 C) (07/25 0954) Pulse Rate:  [29-56] 56 (07/25 0954) Resp:  [12-17] 16 (07/25 0954) BP: (83-112)/(44-98) 104/63 (07/25 0954) SpO2:  [95 %-99 %] 97 % (07/25 0954) Weight:  [118.6 kg-121.2 kg] 118.6 kg (07/25 0951)  Weight change: -1.6 kg Filed Weights   06/25/23 1453 06/26/23 0500 06/26/23 0951  Weight: 118.9 kg 121.2 kg 118.6 kg    Intake/Output: I/O last 3 completed shifts: In: 50 [P.O.:50] Out: 3000 [Other:3000]   Intake/Output this shift:  Total I/O In: 120 [P.O.:120] Out: -   Physical Exam: General: NAD, laying in bed  Head: Normocephalic, atraumatic. Moist oral mucosal membranes  Eyes: Anicteric  Lungs:  Clear to auscultation, normal effort  Heart: Regular rate and rhythm  Abdomen:  Soft, nontender, edema  Extremities: no peripheral edema.  Neurologic: Alert and able to answer questions appropriately  Skin: No lesions  Access: Lt AVF    Basic Metabolic Panel: Recent Labs  Lab 06/22/23 0547 06/23/23 0455 06/24/23 0805 06/25/23 0808 06/26/23 0425  NA 133* 135 135 135 138  K 4.1 4.6 4.9 4.5 4.8  CL 98 96* 97* 100 100  CO2 27 25 25 23 24   GLUCOSE 114* 114* 127* 131* 102*  BUN 48* 60* 86* 62* 73*  CREATININE 4.62* 6.21* 7.56* 6.63* 7.67*  CALCIUM 8.4* 8.5* 8.2* 8.0* 8.0*  PHOS 3.8 5.2* 5.3* 4.0 5.6*    Liver Function Tests: Recent Labs  Lab 06/22/23 0547 06/23/23 0455 06/24/23 0805 06/25/23 0808 06/26/23 0425  AST  --   --   --  28  --   ALT  --   --   --  25  --   ALKPHOS  --   --   --  46  --   BILITOT  --   --   --  0.5  --   PROT  --   --   --  6.7  --   ALBUMIN 3.0* 3.2* 3.1* 3.2*  3.1* 3.3*   No results  for input(s): "LIPASE", "AMYLASE" in the last 168 hours.  No results for input(s): "AMMONIA" in the last 168 hours.  CBC: Recent Labs  Lab 06/22/23 0547 06/23/23 0455 06/24/23 0805 06/25/23 0808 06/26/23 0425  WBC 10.0 10.9* 10.3 9.9 11.9*  HGB 9.2* 9.8* 9.6* 10.1* 10.5*  HCT 29.3* 31.4* 31.4* 33.7* 34.4*  MCV 100.3* 100.0 103.0* 108.4* 104.6*  PLT 152 157 155 118* 153    Cardiac Enzymes: No results for input(s): "CKTOTAL", "CKMB", "CKMBINDEX", "TROPONINI" in the last 168 hours.  BNP: Invalid input(s): "POCBNP"  CBG: Recent Labs  Lab 06/24/23 2127 06/25/23 0729 06/25/23 1546 06/25/23 2145 06/26/23 0751  GLUCAP 112* 122* 85 137* 73    Microbiology: Results for orders placed or performed during the hospital encounter of 06/07/23  Blood culture (routine x 2)     Status: None   Collection Time: 06/07/23 12:54 PM   Specimen: BLOOD  Result Value Ref Range Status   Specimen Description BLOOD RIGHT ANTECUBITAL  Final   Special Requests   Final    BOTTLES DRAWN AEROBIC AND ANAEROBIC Blood Culture adequate volume   Culture  Final    NO GROWTH 5 DAYS Performed at [facility], [address] [place] [place], [place] [address]    Report Status 06/12/2023 FINAL  Final  Blood culture (routine x 2)     Status: None   Collection Time: 06/07/23 12:54 PM   Specimen: BLOOD  Result Value Ref Range Status   Specimen Description BLOOD BLOOD RIGHT FOREARM  Final   Special Requests   Final    BOTTLES DRAWN AEROBIC AND ANAEROBIC Blood Culture adequate volume   Culture   Final    NO GROWTH 5 DAYS Performed at [facility], [address], [place], [place] [address]    Report Status 06/12/2023 FINAL  Final  MRSA Next Gen by PCR, Nasal     Status: None   Collection Time: 06/07/23 10:18 PM   Specimen: Nasal Mucosa; Nasal Swab  Result Value Ref Range Status   MRSA by PCR Next Gen NOT DETECTED NOT DETECTED Final    Comment: (NOTE) The GeneXpert MRSA Assay (FDA  approved for NASAL specimens only), is one component of a comprehensive MRSA colonization surveillance program. It is not intended to diagnose MRSA infection nor to guide or monitor treatment for MRSA infections. Test performance is not FDA approved in patients less than 40 years old. Performed at [facility], [address] [place], [place] [address]     Coagulation Studies: No results for input(s): "LABPROT", "INR" in the last 72 hours.  Urinalysis: No results for input(s): "COLORURINE", "LABSPEC", "PHURINE", "GLUCOSEU", "HGBUR", "BILIRUBINUR", "KETONESUR", "PROTEINUR", "UROBILINOGEN", "NITRITE", "LEUKOCYTESUR" in the last 72 hours.  Invalid input(s): "APPERANCEUR"    Imaging: No results found.   Medications:    sodium chloride Stopped (06/10/23 1900)   sodium chloride     anticoagulant sodium citrate      apixaban  5 mg Oral BID   droxidopa  100 mg Oral TID WC   epoetin (EPOGEN/PROCRIT) injection  10,000 Units Intravenous Q T,Th,Sa-HD   ezetimibe  10 mg Oral Daily   feeding supplement (NEPRO CARB STEADY)  237 mL Oral TID BM   fludrocortisone  0.2 mg Oral BID   insulin aspart  0-20 Units Subcutaneous TID WC   insulin aspart  0-5 Units Subcutaneous QHS   insulin glargine-yfgn  5 Units Subcutaneous Daily   loratadine  10 mg Oral Daily   midodrine  10 mg Oral TID WC   multivitamin  1 tablet Oral QHS   pregabalin  100 mg Oral QHS   pregabalin  50 mg Oral BID   rosuvastatin  10 mg Oral Daily   senna-docusate  1 tablet Oral BID   sodium chloride flush  3 mL Intravenous Q12H   sodium chloride, acetaminophen, alteplase, alum & mag hydroxide-simeth, anticoagulant sodium citrate, bisacodyl, docusate sodium, guaiFENesin-dextromethorphan, heparin, HYDROcodone-acetaminophen, lidocaine (PF), lidocaine-prilocaine, morphine injection, nitroGLYCERIN, ondansetron (ZOFRAN) IV, mouth rinse, pentafluoroprop-tetrafluoroeth, polyethylene glycol, polyvinyl alcohol, sodium  chloride flush  Assessment/ Plan:  Jacqueline [name] is a 77 y.o.  female  with a PMHx of ESRD on HD TTS at [facility], diabetes mellitus type 2, chronic hypotension, peripheral arterial disease, obstructive sleep apnea, GERD, obesity, hyperlipidemia, anemia chronic kidney disease, secondary hyperparathyroidism, who was admitted to [facility] on 06/07/2023 for evaluation of generalized weakness.   Found to have severe hypotension, pulmonary hypertension, atrial fibrillation and tricuspid valve regurgitation all contributing to hypotension.  TTS at [facility],  1  ESRD: on hemodialysis  -Patient tolerated 5-hour UF only treatment yesterday, UF  3 L achieved. - Patient receiving scheduled dialysis today, UF 2 L as tolerated. - Patient able to stand for pretreatment weight 118.6 kg. - Next treatment scheduled for Saturday - Patient will benefit from extended treatments with decreased blood flow.  Will notify outpatient clinic.   2.  Chronic hypotension with severe pulmonary hypertension.  Systolic blood pressure usually in the 80s as an outpatient.  Weaned off pressors - midodrine 10 mg 3 times daily . Higher dose caused symptomatic bradycardia on dialysis treatment.  - fludrocortisone - droxidopa  -Blood pressure remained stable during extended treatment yesterday. - Heart rate 30s to 50s during treatment.  3. Anemia of chronic kidney disease Lab Results  Component Value Date   HGB 10.5 (L) 06/26/2023  Will reduce EPO to low-dose with  treatment today Hemoglobin remains within optimal range. Continue Mircera as an outpatient.  4. Diabetes mellitus type II with chronic kidney disease/renal manifestations: noninsulin dependent.  Hemoglobin A1c is 5.8 on 06/07/23.  -Primary team to manage sliding scale insulin.  5. Secondary Hyperparathyroidism:    Lab Results  Component Value Date   PTH 388.6 (H) 01/17/2014   CALCIUM 8.0 (L) 06/26/2023   CAION 0.92 (L) 06/11/2023    PHOS 5.6 (H) 06/26/2023   -Calcium acceptable, phosphorus slightly elevated. - Will continue to monitor. - Holding Auryxia and Sensipar     LOS: 19 Jacqueline [name] 7/25/20249:59 AM

## 2023-06-26 NOTE — Plan of Care (Signed)
Patient completed dialysis today.  No change in patient condition over coarse of day compared to prior days.

## 2023-06-27 ENCOUNTER — Ambulatory Visit: Payer: Medicare Other | Admitting: Cardiovascular Disease

## 2023-06-27 DIAGNOSIS — R001 Bradycardia, unspecified: Secondary | ICD-10-CM | POA: Diagnosis not present

## 2023-06-27 LAB — LACTIC ACID, PLASMA: Lactic Acid, Venous: 0.7 mmol/L (ref 0.5–1.9)

## 2023-06-27 LAB — GLUCOSE, CAPILLARY
Glucose-Capillary: 96 mg/dL (ref 70–99)
Glucose-Capillary: 102 mg/dL — ABNORMAL HIGH (ref 70–99)
Glucose-Capillary: 127 mg/dL — ABNORMAL HIGH (ref 70–99)
Glucose-Capillary: 147 mg/dL — ABNORMAL HIGH (ref 70–99)

## 2023-06-27 MED ORDER — POLYETHYLENE GLYCOL 3350 17 G PO PACK
17.0000 g | PACK | Freq: Two times a day (BID) | ORAL | Status: DC
  Administered 2023-06-27 – 2023-06-28 (×3): 17 g via ORAL
  Filled 2023-06-27 (×6): qty 1

## 2023-06-27 NOTE — Progress Notes (Signed)
Physical Therapy Treatment Patient Details Name: Jacqueline [name] MRN: [id number] DOB: August 17, 1946 Today's Date: 06/27/2023   History of Present Illness 77 y.o female with significant PMHx significant for HTN, T2DM, ESRD on HD TTS, Chronic debility, CAD s/p stent, PAF, OSA, PAD, Left knee arthritis, Chronic lower back pain, GERD, Obesity, HLD who presented to the ED from Dialysis with generalized weakness x 4 days.    PT Comments  Patient received in recliner, she reports she feels constipated. Assisted patient with min A to BSC. Patient was able to have BM. Then patient ambulated 25' x 2 with RW and min guard, chair follow. Patient progressing well this session and will continue to benefit from skilled PT to improve strength, endurance and functional independence.     Assistance Recommended at Discharge Intermittent Supervision/Assistance  If plan is discharge home, recommend the following:  Can travel by private vehicle    A lot of help with walking and/or transfers;A lot of help with bathing/dressing/bathroom;Assist for transportation;Help with stairs or ramp for entrance   No  Equipment Recommendations  None recommended by PT    Recommendations for Other Services       Precautions / Restrictions Precautions Precautions: Fall Restrictions Weight Bearing Restrictions: No     Mobility  Bed Mobility Overal bed mobility: Needs Assistance Bed Mobility: Sit to Supine       Sit to supine: Min assist, +2 for physical assistance        Transfers Overall transfer level: Needs assistance Equipment used: Rolling walker (2 wheels) Transfers: Sit to/from Stand, Bed to chair/wheelchair/BSC Sit to Stand: Min assist   Step pivot transfers: Min assist       General transfer comment: min A +1-2    Ambulation/Gait Ambulation/Gait assistance: Min guard Gait Distance (Feet): 50 Feet Assistive device: Rolling walker (2 wheels) Gait Pattern/deviations: Step-through pattern,  Decreased step length - right, Decreased step length - left, Decreased stride length Gait velocity: decreased     General Gait Details: patient able to ambulate with min guard and chair follow for safety. Seated rest after 25 feet   Stairs             Wheelchair Mobility     Tilt Bed    Modified Rankin (Stroke Patients Only)       Balance Overall balance assessment: Needs assistance Sitting-balance support: Feet supported Sitting balance-Leahy Scale: Good     Standing balance support: Bilateral upper extremity supported, During functional activity, Reliant on assistive device for balance Standing balance-Leahy Scale: Fair                              Cognition Arousal/Alertness: Awake/alert Behavior During Therapy: WFL for tasks assessed/performed Overall Cognitive Status: Within Functional Limits for tasks assessed                                 General Comments: Pt is A and agreeable. Does present with somewhat flat affect        Exercises      General Comments General comments (skin integrity, edema, etc.): supine BP 84/45, MAP 57. seated after transfer BP 88/53, MAP 61      Pertinent Vitals/Pain Pain Assessment Pain Assessment: No/denies pain    Home Living  Prior Function            PT Goals (current goals can now be found in the care plan section) Acute Rehab PT Goals Patient Stated Goal: improve mobility/strength PT Goal Formulation: With patient Time For Goal Achievement: 07/11/23 Potential to Achieve Goals: Good Progress towards PT goals: Progressing toward goals    Frequency    Min 1X/week      PT Plan Current plan remains appropriate    Co-evaluation              AM-PAC PT "6 Clicks" Mobility   Outcome Measure  Help needed turning from your back to your side while in a flat bed without using bedrails?: A Lot Help needed moving from lying on your back to  sitting on the side of a flat bed without using bedrails?: A Lot Help needed moving to and from a bed to a chair (including a wheelchair)?: A Little Help needed standing up from a chair using your arms (e.g., wheelchair or bedside chair)?: A Little Help needed to walk in hospital room?: A Lot Help needed climbing 3-5 steps with a railing? : Total 6 Click Score: 13    End of Session Equipment Utilized During Treatment: Oxygen Activity Tolerance: Patient limited by fatigue Patient left: in bed;with call bell/phone within reach Nurse Communication: Mobility status PT Visit Diagnosis: Muscle weakness (generalized) (M62.81);Difficulty in walking, not elsewhere classified (R26.2);Unsteadiness on feet (R26.81)     Time: 1325-1406 PT Time Calculation (min) (ACUTE ONLY): 41 min  Charges:    $Gait Training: 8-22 mins $Therapeutic Activity: 8-22 mins PT General Charges $$ ACUTE PT VISIT: 1 Visit                     Nitisha Civello, PT, GCS 06/27/23,2:18 PM

## 2023-06-27 NOTE — Plan of Care (Signed)
°  Problem: Education: °Goal: Knowledge of General Education information will improve °Description: Including pain rating scale, medication(s)/side effects and non-pharmacologic comfort measures °Outcome: Progressing °  °Problem: Health Behavior/Discharge Planning: °Goal: Ability to manage health-related needs will improve °Outcome: Progressing °  °Problem: Clinical Measurements: °Goal: Respiratory complications will improve °Outcome: Progressing °Goal: Cardiovascular complication will be avoided °Outcome: Progressing °  °Problem: Nutrition: °Goal: Adequate nutrition will be maintained °Outcome: Progressing °  °

## 2023-06-27 NOTE — Progress Notes (Signed)
[facility]  ROUNDING NOTE   Subjective:   Patient seen laying in bed, ready to eat breakfast. States she feels well Denies nausea or vomiting  Tolerated dialysis well yesterday, UF 1.5 L Remains on 2 L nasal cannula  Objective:  Vital signs in last 24 hours:  Temp:  [98 F (36.7 C)-98.6 F (37 C)] 98.5 F (36.9 C) (07/26 0800) Pulse Rate:  [26-67] 67 (07/26 1100) Resp:  [13-21] 17 (07/26 1100) BP: (69-110)/(35-64) 82/44 (07/26 1100) SpO2:  [90 %-100 %] 100 % (07/26 1100) Weight:  [117 kg-118 kg] 117 kg (07/26 0500)  Weight change: -3.1 kg Filed Weights   06/26/23 0951 06/26/23 1357 06/27/23 0500  Weight: 118.6 kg 118 kg 117 kg    Intake/Output: I/O last 3 completed shifts: In: 170 [P.O.:170] Out: 1500 [Other:1500]   Intake/Output this shift:  Total I/O In: 120 [P.O.:120] Out: -   Physical Exam: General: NAD, laying in bed  Head: Normocephalic, atraumatic. Moist oral mucosal membranes  Eyes: Anicteric  Lungs:  Clear to auscultation, normal effort  Heart: Regular rate and rhythm  Abdomen:  Soft, nontender, edema  Extremities: no peripheral edema.  Neurologic: Alert and able to answer questions appropriately  Skin: No lesions  Access: Lt AVF    Basic Metabolic Panel: Recent Labs  Lab 06/22/23 0547 06/23/23 0455 06/24/23 0805 06/25/23 0808 06/26/23 0425  NA 133* 135 135 135 138  K 4.1 4.6 4.9 4.5 4.8  CL 98 96* 97* 100 100  CO2 27 25 25 23 24   GLUCOSE 114* 114* 127* 131* 102*  BUN 48* 60* 86* 62* 73*  CREATININE 4.62* 6.21* 7.56* 6.63* 7.67*  CALCIUM 8.4* 8.5* 8.2* 8.0* 8.0*  PHOS 3.8 5.2* 5.3* 4.0 5.6*    Liver Function Tests: Recent Labs  Lab 06/22/23 0547 06/23/23 0455 06/24/23 0805 06/25/23 0808 06/26/23 0425  AST  --   --   --  28  --   ALT  --   --   --  25  --   ALKPHOS  --   --   --  46  --   BILITOT  --   --   --  0.5  --   PROT  --   --   --  6.7  --   ALBUMIN 3.0* 3.2* 3.1* 3.2*  3.1* 3.3*   No results for  input(s): "LIPASE", "AMYLASE" in the last 168 hours.  No results for input(s): "AMMONIA" in the last 168 hours.  CBC: Recent Labs  Lab 06/23/23 0455 06/24/23 0805 06/25/23 0808 06/26/23 0425 06/27/23 0528  WBC 10.9* 10.3 9.9 11.9* 10.5  HGB 9.8* 9.6* 10.1* 10.5* 10.3*  HCT 31.4* 31.4* 33.7* 34.4* 34.1*  MCV 100.0 103.0* 108.4* 104.6* 104.0*  PLT 157 155 118* 153 138*    Cardiac Enzymes: No results for input(s): "CKTOTAL", "CKMB", "CKMBINDEX", "TROPONINI" in the last 168 hours.  BNP: Invalid input(s): "POCBNP"  CBG: Recent Labs  Lab 06/25/23 2145 06/26/23 0751 06/26/23 2117 06/27/23 0728 06/27/23 1136  GLUCAP 137* 73 139* 96 102*    Microbiology: Results for orders placed or performed during the hospital encounter of 06/07/23  Blood culture (routine x 2)     Status: None   Collection Time: 06/07/23 12:54 PM   Specimen: BLOOD  Result Value Ref Range Status   Specimen Description BLOOD RIGHT ANTECUBITAL  Final   Special Requests   Final    BOTTLES DRAWN AEROBIC AND ANAEROBIC Blood Culture adequate volume  Culture   Final    NO GROWTH 5 DAYS Performed at [facility], [address] [place] [place], [place] [address]    Report Status 06/12/2023 FINAL  Final  Blood culture (routine x 2)     Status: None   Collection Time: 06/07/23 12:54 PM   Specimen: BLOOD  Result Value Ref Range Status   Specimen Description BLOOD BLOOD RIGHT FOREARM  Final   Special Requests   Final    BOTTLES DRAWN AEROBIC AND ANAEROBIC Blood Culture adequate volume   Culture   Final    NO GROWTH 5 DAYS Performed at [facility], [address] [place], [place] [address]    Report Status 06/12/2023 FINAL  Final  MRSA Next Gen by PCR, Nasal     Status: None   Collection Time: 06/07/23 10:18 PM   Specimen: Nasal Mucosa; Nasal Swab  Result Value Ref Range Status   MRSA by PCR Next Gen NOT DETECTED NOT DETECTED Final    Comment: (NOTE) The GeneXpert MRSA Assay (FDA  approved for NASAL specimens only), is one component of a comprehensive MRSA colonization surveillance program. It is not intended to diagnose MRSA infection nor to guide or monitor treatment for MRSA infections. Test performance is not FDA approved in patients less than 64 years old. Performed at [facility], [address] [place], [place] [address]     Coagulation Studies: No results for input(s): "LABPROT", "INR" in the last 72 hours.  Urinalysis: No results for input(s): "COLORURINE", "LABSPEC", "PHURINE", "GLUCOSEU", "HGBUR", "BILIRUBINUR", "KETONESUR", "PROTEINUR", "UROBILINOGEN", "NITRITE", "LEUKOCYTESUR" in the last 72 hours.  Invalid input(s): "APPERANCEUR"    Imaging: US Venous Img Lower Unilateral Left (DVT)  Result Date: 06/26/2023 CLINICAL DATA:  Left lower extremity pain. EXAM: LEFT LOWER EXTREMITY VENOUS DOPPLER ULTRASOUND TECHNIQUE: Gray-scale sonography with graded compression, as well as color Doppler and duplex ultrasound were performed to evaluate the lower extremity deep venous systems from the level of the common femoral vein and including the common femoral, femoral, profunda femoral, popliteal and calf veins including the posterior tibial, peroneal and gastrocnemius veins when visible. The superficial great saphenous vein was also interrogated. Spectral Doppler was utilized to evaluate flow at rest and with distal augmentation maneuvers in the common femoral, femoral and popliteal veins. COMPARISON:  None Available. FINDINGS: Contralateral Common Femoral Vein: Respiratory phasicity is normal and symmetric with the symptomatic side. No evidence of thrombus. Normal compressibility. Common Femoral Vein: No evidence of thrombus. Normal compressibility, respiratory phasicity and response to augmentation. Saphenofemoral Junction: No evidence of thrombus. Normal compressibility and flow on color Doppler imaging. Profunda Femoral Vein: No evidence of thrombus.  Normal compressibility and flow on color Doppler imaging. Femoral Vein: No evidence of thrombus. Proximal and mid femoral veins are normally patent. The distal thigh segment of the femoral vein was difficult to visualize. Normal compressibility, respiratory phasicity and response to augmentation. Popliteal Vein: No evidence of thrombus. Normal compressibility, respiratory phasicity and response to augmentation. Calf Veins: No evidence of thrombus. Normal compressibility and flow on color Doppler imaging. Superficial Great Saphenous Vein: No evidence of thrombus. Normal compressibility. Venous Reflux:  None. Other Findings: No evidence of superficial thrombophlebitis or abnormal fluid collection. IMPRESSION: No evidence of left lower extremity deep venous thrombosis. Electronically Signed   By: Irish Lack M.D.   On: 06/26/2023 16:35     Medications:    sodium chloride Stopped (06/10/23 1900)   sodium chloride     anticoagulant sodium citrate  apixaban  5 mg Oral BID   droxidopa  200 mg Oral TID WC   epoetin (EPOGEN/PROCRIT) injection  4,000 Units Intravenous Q T,Th,Sa-HD   ezetimibe  10 mg Oral Daily   feeding supplement (NEPRO CARB STEADY)  237 mL Oral TID BM   fludrocortisone  0.2 mg Oral BID   insulin aspart  0-20 Units Subcutaneous TID WC   insulin aspart  0-5 Units Subcutaneous QHS   insulin glargine-yfgn  5 Units Subcutaneous Daily   loratadine  10 mg Oral Daily   midodrine  10 mg Oral TID WC   multivitamin  1 tablet Oral QHS   pregabalin  100 mg Oral QHS   pregabalin  50 mg Oral BID   rosuvastatin  10 mg Oral Daily   senna-docusate  1 tablet Oral BID   sodium chloride flush  3 mL Intravenous Q12H   sodium chloride, acetaminophen, alteplase, alum & mag hydroxide-simeth, anticoagulant sodium citrate, bisacodyl, docusate sodium, guaiFENesin-dextromethorphan, heparin, HYDROcodone-acetaminophen, lidocaine (PF), lidocaine-prilocaine, morphine injection, nitroGLYCERIN, ondansetron  (ZOFRAN) IV, mouth rinse, pentafluoroprop-tetrafluoroeth, polyethylene glycol, polyvinyl alcohol, sodium chloride flush  Assessment/ Plan:  [name] is a 77 y.o.  female  with a PMHx of ESRD on HD TTS at [facility], diabetes mellitus type 2, chronic hypotension, peripheral arterial disease, obstructive sleep apnea, GERD, obesity, hyperlipidemia, anemia chronic kidney disease, secondary hyperparathyroidism, who was admitted to [facility] on 06/07/2023 for evaluation of generalized weakness.   Found to have severe hypotension, pulmonary hypertension, atrial fibrillation and tricuspid valve regurgitation all contributing to hypotension.  TTS at [facility],  1  ESRD: on hemodialysis  -Continue TTS schedule - Posttreatment weight on Thursday was 118 kg. - Next treatment scheduled for Saturday - Patient will benefit from extended treatments with decreased blood flow.     2.  Chronic hypotension with severe pulmonary hypertension.  Systolic blood pressure usually in the 80s as an outpatient.  Weaned off pressors.  Likely due to severe pulmonary hypertension, with moderate to severe tricuspid regurgitation.  Was evaluated by cardiologist on June 09, 2023.  Not a candidate for aggressive surgical treatments.  Arterial line blood pressure sugars were in the 120s to 140s noted around middle of July. - midodrine 10 mg 3 times daily . Higher dose caused symptomatic bradycardia on dialysis treatment.  - fludrocortisone - droxidopa  -Blood pressure remained stable during extended treatment yesterday. - Heart rate 30s to 50s during treatment. Palliative care following as this may be new normal for her.  3. Anemia of chronic kidney disease Lab Results  Component Value Date   HGB 10.3 (L) 06/27/2023  Currently on Epogen 4000 units with dialysis treatments. Hemoglobin remains within optimal range. Continue Mircera as an outpatient.  4. Diabetes mellitus type II with chronic  kidney disease/renal manifestations: noninsulin dependent.  Hemoglobin A1c is 5.8 on 06/07/23.  -Primary team to manage sliding scale insulin.  5. Secondary Hyperparathyroidism:    Lab Results  Component Value Date   PTH 388.6 (H) 01/17/2014   CALCIUM 8.0 (L) 06/26/2023   CAION 0.92 (L) 06/11/2023   PHOS 5.6 (H) 06/26/2023   -Calcium acceptable, phosphorus slightly elevated. - Will continue to monitor. - Holding Auryxia and Sensipar     LOS: 20 Miro Balderson 7/26/202412:45 PM

## 2023-06-27 NOTE — Progress Notes (Signed)
Progress Note    Jacqueline [name]  ZOX:[id number] DOB: 04-12-1946  DOA: 06/07/2023 PCP: Macky Lower, MD      Brief Narrative:    Medical records reviewed and are as summarized below:  Jacqueline [name] is a 77 y.o. female with PMHx significant for HTN, chronic hypotension, type II DM, ESRD on HD TTS, Chronic debility, CAD s/p stent, PAF, OSA, PAD, Left knee arthritis, Chronic lower back pain, GERD, Obesity, HLD who presented to the ED from the dialysis center with generalized weakness and hypotension with BP of 72/42.  Reportedly, patient had nausea, vomiting, diarrhea and left lower quadrant abdominal pain the night prior to admission.  ED Course:  Initial vital signs showed HR of 50 beats/minute, BP 81/51 mm Hg, the RR 16 breaths/minute, and the oxygen saturation 91% on 2L and a temperature of 97.37F (36.6C).   Pertinent Labs/Diagnostics.  EKG:?Afib vs junctional bradycardia rate 40bmp. Incomplete RBB,   CXR, CT scan obtained and show no signs of infection or other abnormality. Patient given, midodrine, atropine for significant bradycardia and  500 cc of fluids and started on broad-spectrum antibiotics Vanco cefepime and Flagyl for  suspected sepsis of unknown source. Patient remained hypotensive despite  intervention as above. PCCM consulted for possible pressors.    Significant Hospital Events: Including procedures, antibiotic start and stop dates in addition to other pertinent events   06/07/2023: admit to ICU with bradycardia, started on dopamine 06/08/2023: weaned off dopamine, hypotensive again, dopamine restarted 06/09/2023:  Remains hypotensive requiring Levophed, (pt is vasculopath, doubt accuracy of cuff BP as pt is awake, alert, and asymptomatic).  Attempted Arterial line but unsuccessful, Vascular Surgery consulted to assist with placement. Nephrology consulted, received HD. 06/10/2023: Continues to require Levophed (6 mcg currently), when Levo weaned down does exhibit  some Bradycardia in the upper 50's.  Cardiology considering R Heart Cath today. Holding on transfer to [facility] for pacer at this time. 06/11/2023: Levophed weaning down, this AM. LHC/RHC with mild-moderate non-obstructive CAD, patent proximal RCA stent, mild-moderately elevated L heart filling pressures, severely elevated R heart/pulm artery pressures, mildly reduced CO/CI. Episode of chest pressure with HD, resolved with morphine. 06/12/2023: Additional episodes of chest tightness/pressure overnight (0150 and 0554). EKG without changes, trops flat. Hypertensive to 227/80, then with NE off HR dropped to 29.  Droxidopa was eventually stopped. 06/13/2023: No events overnight, no chest pain or hypertension. Remains on Levophed.  Decrease Midodrine to 5 mg TID and restart Droxidopa, increase Florinef and stop Solu-cortef in attempts to wean Levophed off. Obtain CT Chest to rule out Pulmonary pathology for etiology of PH. 06/14/2023: chest pain yesterday. Re-started on levophed over night. CT shows no acute finding of ILD. Droxidopa re-started yesterday. 06/15/2023: completed dialysis yesterday. Re-started again on levophed.  06/16/23- met with cardiology to discuss treatment options.  At this time patient remains severely hypotensive on maximal medical management. Her Renal impairment and hypotension limit therapy.  Recommendation for palliative care at this time due to poor prognosis. 06/17/23- patient remains hypotensive with poor prognosis. She is requiring less vasopressor support.  06/18/23- patient is off vasopressors.  Still has overall very poor prognosis.  Recommendation for palliative/hospice.    Interim timeframe since above events - hypotension remains an issue.  Patient on midodrine, Florinef and droxidopa with persistently soft BP's.   Assessment/Plan:   Principal Problem:   Symptomatic bradycardia Active Problems:   Hypotension   Permanent atrial fibrillation (HCC)   End stage renal  disease (HCC)   Bradycardia   Morbid obesity (HCC)   Severe pulmonary hypertension (HCC)   Goals of care, counseling/discussion    Circulatory shock (suspected cardiogenic shock) - resolved Acute on chronic hypotension:  Dysautonomia Weaned off of IV Levophed drip on 06/16/2024. BP is still low and has been fluctuating - however, pt shows no signs of inadequate perfusion  -- Continue midodrine, fludrocortisone and droxidopa. --BP's in 70's/50's, MAP 50+ are acceptable -- peripheral BP's are falsely low.  A-line pressures in ICU were significantly higher and pt does NOT show signs of poor perfusion.  Mentation is normal. --Monitor mental status as sign of adequate perfusion  7/25 - Discussed droxidopa with clinical pharmacist and cardiologist.   It was started with intent to titrate dose up, with hopes to wean down midodrine dose. --Increase droxidopa 100 >> 200 mg BID --Pt should be upright as much as possible for droxidopa to be effective -- OOB to chair --Try compression stockings, thigh-high if pt will tolerate  Persistent atrial fibrillation, bradycardia: Seen by EP cardiologist and she is deemed not to be a candidate for pacemaker at this time.  Not on any rate control medications because of bradycardia. --Continue Eliquis  CAD s/p RCA PCI, severe pulmonary hypertension, moderate to severe TR: S/p right and left heart cath on 06/11/2023 showed patent RCA stent, mid to moderate nonobstructive CAD, severely elevated right heart and pulmonary artery pressures.  ESRD on hemodialysis anemia of CKD:  Nephrology following for dialysis Ultrafiltration has been limited by hypotension  Left leg pain (popliteal fossa) -- pt on Eliquis, unlikely DVT but will evaluate.  Baker's cyst also in differential. --US doppler of LLE ordered - follow results  Hypokalemia: Improved  Chronic hyponatremia: Asymptomatic.  Monitor BMP  Type II DM: Continue Semglee and NovoLog as needed for  hyperglycemia. Hemoglobin A1c was 5.8 on 06/07/2023  Thrombocytopenia: fluctuating platelet count but overall improved.  Other comorbidities include chronic debility, chronic low back pain, left knee arthritis  Morbid obesity - Body mass index is 44.27 kg/m. Complicates overall care and prognosis.  Recommend lifestyle modifications including physical activity and diet for weight loss and overall long-term health.   Diet Order             Diet Carb Modified Fluid consistency: Thin; Room service appropriate? Yes; Fluid restriction: 1200 mL Fluid  Diet effective now                   Consultants: Cardiology PCCM Nephrology Palliative care  Procedures: None   Medications:    apixaban  5 mg Oral BID   droxidopa  200 mg Oral TID WC   epoetin (EPOGEN/PROCRIT) injection  4,000 Units Intravenous Q T,Th,Sa-HD   ezetimibe  10 mg Oral Daily   feeding supplement (NEPRO CARB STEADY)  237 mL Oral TID BM   fludrocortisone  0.2 mg Oral BID   insulin aspart  0-20 Units Subcutaneous TID WC   insulin aspart  0-5 Units Subcutaneous QHS   insulin glargine-yfgn  5 Units Subcutaneous Daily   loratadine  10 mg Oral Daily   midodrine  10 mg Oral TID WC   multivitamin  1 tablet Oral QHS   pregabalin  100 mg Oral QHS   pregabalin  50 mg Oral BID   rosuvastatin  10 mg Oral Daily   senna-docusate  1 tablet Oral BID   sodium chloride flush  3 mL Intravenous Q12H   Continuous Infusions:  sodium chloride Stopped (06/10/23 1900)  sodium chloride     anticoagulant sodium citrate       Anti-infectives (From admission, onward)    Start     Dose/Rate Route Frequency Ordered Stop   06/10/23 1200  vancomycin (VANCOCIN) IVPB 1000 mg/200 mL premix  Status:  Discontinued       Placed in "Followed by" Linked Group   1,000 mg 200 mL/hr over 60 Minutes Intravenous Every T-Th-Sa (Hemodialysis) 06/08/23 1058 06/09/23 1029   06/08/23 1145  vancomycin (VANCOCIN) IVPB 1000 mg/200 mL premix        Placed in "Followed by" Linked Group   1,000 mg 200 mL/hr over 60 Minutes Intravenous  Once 06/08/23 1058 06/08/23 1655   06/08/23 1100  piperacillin-tazobactam (ZOSYN) IVPB 2.25 g  Status:  Discontinued        2.25 g 100 mL/hr over 30 Minutes Intravenous Every 8 hours 06/08/23 1058 06/09/23 1029   06/07/23 1400  metroNIDAZOLE (FLAGYL) IVPB 500 mg        500 mg 100 mL/hr over 60 Minutes Intravenous  Once 06/07/23 1346 06/07/23 1652   06/07/23 1400  ceFEPIme (MAXIPIME) 2 g in sodium chloride 0.9 % 100 mL IVPB        2 g 200 mL/hr over 30 Minutes Intravenous  Once 06/07/23 1349 06/07/23 1512   06/07/23 1400  vancomycin (VANCOCIN) IVPB 1000 mg/200 mL premix        1,000 mg 200 mL/hr over 60 Minutes Intravenous  Once 06/07/23 1349 06/07/23 1652              Family Communication/Anticipated D/C date and plan/Code Status   DVT prophylaxis: Place TED hose Start: 06/27/23 0849 SCDs Start: 06/11/23 1358 apixaban (ELIQUIS) tablet 5 mg     Code Status: Full Code  Family Communication: None Disposition Plan: Plan to discharge to SNF   Status is: Inpatient Remains inpatient appropriate because: Ongoing hypotension     Subjective:   Pt seen in ICU this AM. She was sleeping but woke easily.  Reports not sleeping well last night.  No other acute complaints.  Does not feel dizzy or lightheaded.   Objective:     Vitals:   06/27/23 0900 06/27/23 1000 06/27/23 1100 06/27/23 1200  BP: (!) 69/35 110/64 (!) 82/44 (!) 91/54  Pulse: 60 66 67 (!) 59  Resp: 14 19 17 17   Temp:      TempSrc:      SpO2: 99% 100% 100% 96%  Weight:      Height:       No data found.   Intake/Output Summary (Last 24 hours) at 06/27/2023 1354 Last data filed at 06/27/2023 0815 Gross per 24 hour  Intake 120 ml  Output 1500 ml  Net -1380 ml   Filed Weights   06/26/23 0951 06/26/23 1357 06/27/23 0500  Weight: 118.6 kg 118 kg 117 kg    Exam:  General exam: sleeping ,woke to voice, no acute  distress, obese HEENT: moist mucus membranes, hearing grossly normal  Respiratory system: CTAB diminished bases, no wheezes, rales or rhonchi, normal respiratory effort. Cardiovascular system: normal S1/S2, RRR, LUE fistula noted Gastrointestinal system: soft, NT, ND Central nervous system: A&O x 3. no gross focal neurologic deficits, normal speech Extremities: LUE fistula, no BLE edema Skin: dry, intact, normal temperature Psychiatry: normal mood, congruent affect, judgement and insight appear normal     Data Reviewed:   I have personally reviewed following labs and imaging studies:  Labs: Labs show the following:   Basic  Metabolic Panel: Recent Labs  Lab 06/22/23 0547 06/23/23 0455 06/24/23 0805 06/25/23 0808 06/26/23 0425  NA 133* 135 135 135 138  K 4.1 4.6 4.9 4.5 4.8  CL 98 96* 97* 100 100  CO2 27 25 25 23 24   GLUCOSE 114* 114* 127* 131* 102*  BUN 48* 60* 86* 62* 73*  CREATININE 4.62* 6.21* 7.56* 6.63* 7.67*  CALCIUM 8.4* 8.5* 8.2* 8.0* 8.0*  PHOS 3.8 5.2* 5.3* 4.0 5.6*   GFR Estimated Creatinine Clearance: 7.5 mL/min (A) (by C-G formula based on SCr of 7.67 mg/dL (H)). Liver Function Tests: Recent Labs  Lab 06/22/23 0547 06/23/23 0455 06/24/23 0805 06/25/23 0808 06/26/23 0425  AST  --   --   --  28  --   ALT  --   --   --  25  --   ALKPHOS  --   --   --  46  --   BILITOT  --   --   --  0.5  --   PROT  --   --   --  6.7  --   ALBUMIN 3.0* 3.2* 3.1* 3.2*  3.1* 3.3*   No results for input(s): "LIPASE", "AMYLASE" in the last 168 hours. No results for input(s): "AMMONIA" in the last 168 hours. Coagulation profile No results for input(s): "INR", "PROTIME" in the last 168 hours.  CBC: Recent Labs  Lab 06/23/23 0455 06/24/23 0805 06/25/23 0808 06/26/23 0425 06/27/23 0528  WBC 10.9* 10.3 9.9 11.9* 10.5  HGB 9.8* 9.6* 10.1* 10.5* 10.3*  HCT 31.4* 31.4* 33.7* 34.4* 34.1*  MCV 100.0 103.0* 108.4* 104.6* 104.0*  PLT 157 155 118* 153 138*   Cardiac  Enzymes: No results for input(s): "CKTOTAL", "CKMB", "CKMBINDEX", "TROPONINI" in the last 168 hours. BNP (last 3 results) No results for input(s): "PROBNP" in the last 8760 hours. CBG: Recent Labs  Lab 06/25/23 2145 06/26/23 0751 06/26/23 2117 06/27/23 0728 06/27/23 1136  GLUCAP 137* 73 139* 96 102*   D-Dimer: No results for input(s): "DDIMER" in the last 72 hours. Hgb A1c: No results for input(s): "HGBA1C" in the last 72 hours. Lipid Profile: No results for input(s): "CHOL", "HDL", "LDLCALC", "TRIG", "CHOLHDL", "LDLDIRECT" in the last 72 hours. Thyroid function studies: No results for input(s): "TSH", "T4TOTAL", "T3FREE", "THYROIDAB" in the last 72 hours.  Invalid input(s): "FREET3" Anemia work up: No results for input(s): "VITAMINB12", "FOLATE", "FERRITIN", "TIBC", "IRON", "RETICCTPCT" in the last 72 hours. Sepsis Labs: Recent Labs  Lab 06/24/23 0805 06/25/23 0808 06/26/23 0425 06/27/23 0528 06/27/23 0944  WBC 10.3 9.9 11.9* 10.5  --   LATICACIDVEN  --   --   --   --  0.7    Microbiology No results found for this or any previous visit (from the past 240 hour(s)).  Procedures and diagnostic studies:  US Venous Img Lower Unilateral Left (DVT)  Result Date: 06/26/2023 CLINICAL DATA:  Left lower extremity pain. EXAM: LEFT LOWER EXTREMITY VENOUS DOPPLER ULTRASOUND TECHNIQUE: Gray-scale sonography with graded compression, as well as color Doppler and duplex ultrasound were performed to evaluate the lower extremity deep venous systems from the level of the common femoral vein and including the common femoral, femoral, profunda femoral, popliteal and calf veins including the posterior tibial, peroneal and gastrocnemius veins when visible. The superficial great saphenous vein was also interrogated. Spectral Doppler was utilized to evaluate flow at rest and with distal augmentation maneuvers in the common femoral, femoral and popliteal veins. COMPARISON:  None Available.  FINDINGS: Contralateral  Common Femoral Vein: Respiratory phasicity is normal and symmetric with the symptomatic side. No evidence of thrombus. Normal compressibility. Common Femoral Vein: No evidence of thrombus. Normal compressibility, respiratory phasicity and response to augmentation. Saphenofemoral Junction: No evidence of thrombus. Normal compressibility and flow on color Doppler imaging. Profunda Femoral Vein: No evidence of thrombus. Normal compressibility and flow on color Doppler imaging. Femoral Vein: No evidence of thrombus. Proximal and mid femoral veins are normally patent. The distal thigh segment of the femoral vein was difficult to visualize. Normal compressibility, respiratory phasicity and response to augmentation. Popliteal Vein: No evidence of thrombus. Normal compressibility, respiratory phasicity and response to augmentation. Calf Veins: No evidence of thrombus. Normal compressibility and flow on color Doppler imaging. Superficial Great Saphenous Vein: No evidence of thrombus. Normal compressibility. Venous Reflux:  None. Other Findings: No evidence of superficial thrombophlebitis or abnormal fluid collection. IMPRESSION: No evidence of left lower extremity deep venous thrombosis. Electronically Signed   By: Irish Lack M.D.   On: 06/26/2023 16:35         LOS: 20 days   Pennie Banter, DO  [facility]   Pager on [email or web address]. If 7PM-7AM, please contact night-coverage at [email or web address]  06/27/2023, 1:54 PM

## 2023-06-27 NOTE — Progress Notes (Signed)
Occupational Therapy Treatment Patient Details Name: Jacqueline [name] MRN: [id number] DOB: 01-13-1946 Today's Date: 06/27/2023   History of present illness 77 y.o female with significant PMHx significant for HTN, T2DM, ESRD on HD TTS, Chronic debility, CAD s/p stent, PAF, OSA, PAD, Left knee arthritis, Chronic lower back pain, GERD, Obesity, HLD who presented to the ED from Dialysis with generalized weakness x 4 days.   OT comments  Jacqueline [name] was seen for OT treatment on this date. Upon arrival to room pt reclined in bed, eager to txtransfer to chair for breakfast. Pt requires MIN A + RW for bed>chair, +2 for safety and lines mgmt. MIN A x2 + RW sit<>stand from chair height, poor static balance improves with cues to use RW properly. RN in during session and cleared for OOB to chair with MAP >50 as per new nursing orders. Pt making progress toward goals, will continue to follow POC. Discharge recommendation remains appropriate.     Recommendations for follow up therapy are one component of a multi-disciplinary discharge planning process, led by the attending physician.  Recommendations may be updated based on patient status, additional functional criteria and insurance authorization.    Assistance Recommended at Discharge Intermittent Supervision/Assistance  Patient can return home with the following  A little help with walking and/or transfers;A lot of help with bathing/dressing/bathroom;Assist for transportation   Equipment Recommendations  Other (comment) (defer)    Recommendations for Other Services      Precautions / Restrictions Precautions Precautions: Fall Restrictions Weight Bearing Restrictions: No       Mobility Bed Mobility Overal bed mobility: Needs Assistance Bed Mobility: Supine to Sit     Supine to sit: Min assist, HOB elevated          Transfers Overall transfer level: Needs assistance Equipment used: Rolling walker (2 wheels) Transfers: Sit to/from  Stand Sit to Stand: Min assist, +2 physical assistance     Step pivot transfers: Min assist     General transfer comment: MIN A from elevated bed, MIN A x2 from chair height     Balance Overall balance assessment: Needs assistance Sitting-balance support: Feet supported Sitting balance-Leahy Scale: Fair     Standing balance support: Bilateral upper extremity supported, During functional activity, Reliant on assistive device for balance Standing balance-Leahy Scale: Fair                             ADL either performed or assessed with clinical judgement   ADL Overall ADL's : Needs assistance/impaired                                       General ADL Comments: MIN A + RW for simulated BSC t/f, +2 for safety and lines mgmt. SETUP self-feeding seated in chair      Cognition Arousal/Alertness: Awake/alert Behavior During Therapy: WFL for tasks assessed/performed Overall Cognitive Status: Within Functional Limits for tasks assessed                                                General Comments supine BP 84/45, MAP 57. seated after transfer BP 88/53, MAP 61    Pertinent Vitals/ Pain       Pain Assessment Pain  Assessment: No/denies pain   Frequency  Min 1X/week        Progress Toward Goals  OT Goals(current goals can now be found in the care plan section)  Progress towards OT goals: Progressing toward goals  Acute Rehab OT Goals Patient Stated Goal: to go home OT Goal Formulation: With patient Time For Goal Achievement: 07/02/23 Potential to Achieve Goals: Fair ADL Goals Pt Will Perform Grooming: with set-up;sitting Pt Will Transfer to Toilet: with modified independence;bedside commode;ambulating Pt Will Perform Toileting - Clothing Manipulation and hygiene: with modified independence;sit to/from stand  Plan Discharge plan remains appropriate;Frequency remains appropriate    Co-evaluation                  AM-PAC OT "6 Clicks" Daily Activity     Outcome Measure   Help from another person eating meals?: None Help from another person taking care of personal grooming?: A Little Help from another person toileting, which includes using toliet, bedpan, or urinal?: A Lot Help from another person bathing (including washing, rinsing, drying)?: A Lot Help from another person to put on and taking off regular upper body clothing?: A Little Help from another person to put on and taking off regular lower body clothing?: A Lot 6 Click Score: 16    End of Session    OT Visit Diagnosis: Unsteadiness on feet (R26.81)   Activity Tolerance Patient tolerated treatment well   Patient Left in chair;with call bell/phone within reach   Nurse Communication Mobility status        Time: 1610-9604 OT Time Calculation (min): 14 min  Charges: OT General Charges $OT Visit: 1 Visit OT Treatments $Self Care/Home Management : 8-22 mins  Kathie Dike, M.S. OTR/L  06/27/23, 10:43 AM  ascom [phone]

## 2023-06-28 DIAGNOSIS — R001 Bradycardia, unspecified: Secondary | ICD-10-CM | POA: Diagnosis not present

## 2023-06-28 LAB — GLUCOSE, CAPILLARY
Glucose-Capillary: 125 mg/dL — ABNORMAL HIGH (ref 70–99)
Glucose-Capillary: 138 mg/dL — ABNORMAL HIGH (ref 70–99)

## 2023-06-28 LAB — CBC
HCT: 34.5 % — ABNORMAL LOW (ref 36.0–46.0)
Hemoglobin: 10.5 g/dL — ABNORMAL LOW (ref 12.0–15.0)
MCH: 32.2 pg (ref 26.0–34.0)
MCHC: 30.4 g/dL (ref 30.0–36.0)
MCV: 105.8 fL — ABNORMAL HIGH (ref 80.0–100.0)
Platelets: 149 10*3/uL — ABNORMAL LOW (ref 150–400)
RBC: 3.26 MIL/uL — ABNORMAL LOW (ref 3.87–5.11)
RDW: 18.5 % — ABNORMAL HIGH (ref 11.5–15.5)
WBC: 10.1 10*3/uL (ref 4.0–10.5)
nRBC: 0 % (ref 0.0–0.2)

## 2023-06-28 LAB — RENAL FUNCTION PANEL
Albumin: 3.3 g/dL — ABNORMAL LOW (ref 3.5–5.0)
Anion gap: 15 (ref 5–15)
BUN: 73 mg/dL — ABNORMAL HIGH (ref 8–23)
CO2: 23 mmol/L (ref 22–32)
Calcium: 8 mg/dL — ABNORMAL LOW (ref 8.9–10.3)
Chloride: 99 mmol/L (ref 98–111)
Creatinine, Ser: 7.66 mg/dL — ABNORMAL HIGH (ref 0.44–1.00)
GFR, Estimated: 5 mL/min — ABNORMAL LOW (ref >60)
Glucose, Bld: 125 mg/dL — ABNORMAL HIGH (ref 70–99)
Phosphorus: 6 mg/dL — ABNORMAL HIGH (ref 2.5–4.6)
Potassium: 4.4 mmol/L (ref 3.5–5.1)
Sodium: 137 mmol/L (ref 135–145)

## 2023-06-28 MED ORDER — MIDODRINE HCL 5 MG PO TABS
ORAL_TABLET | ORAL | Status: AC
  Filled 2023-06-28: qty 2

## 2023-06-28 MED ORDER — ALBUMIN HUMAN 25 % IV SOLN
12.5000 g | Freq: Once | INTRAVENOUS | Status: AC
  Administered 2023-06-28: 12.5 g via INTRAVENOUS

## 2023-06-28 MED ORDER — ALBUMIN HUMAN 25 % IV SOLN
INTRAVENOUS | Status: AC
  Filled 2023-06-28: qty 50

## 2023-06-28 MED ORDER — EPOETIN ALFA 4000 UNIT/ML IJ SOLN
INTRAMUSCULAR | Status: AC
  Filled 2023-06-28: qty 1

## 2023-06-28 MED ORDER — ACETAMINOPHEN 325 MG PO TABS
ORAL_TABLET | ORAL | Status: AC
  Filled 2023-06-28: qty 2

## 2023-06-28 NOTE — Plan of Care (Signed)
  Problem: Clinical Measurements: Goal: Respiratory complications will improve Outcome: Progressing   Problem: Activity: Goal: Risk for activity intolerance will decrease Outcome: Progressing   Problem: Nutrition: Goal: Adequate nutrition will be maintained Outcome: Progressing   Problem: Elimination: Goal: Will not experience complications related to bowel motility Outcome: Progressing   Problem: Pain Managment: Goal: General experience of comfort will improve Outcome: Progressing   Problem: Safety: Goal: Ability to remain free from injury will improve Outcome: Progressing   Problem: Skin Integrity: Goal: Risk for impaired skin integrity will decrease Outcome: Progressing   Problem: Activity: Goal: Ability to return to baseline activity level will improve Outcome: Progressing   Problem: Education: Goal: Knowledge of General Education information will improve Description: Including pain rating scale, medication(s)/side effects and non-pharmacologic comfort measures Outcome: Progressing

## 2023-06-28 NOTE — Progress Notes (Signed)
Progress Note    Jacqueline [name]  ONG:[id number] DOB: 19-Mar-1946  DOA: 06/07/2023 PCP: Jacqueline Lower, MD      Brief Narrative:    Medical records reviewed and are as summarized below:  Jacqueline [name] is a 77 y.o. female with PMHx significant for HTN, chronic hypotension, type II DM, ESRD on HD TTS, Chronic debility, CAD s/p stent, PAF, OSA, PAD, Left knee arthritis, Chronic [name] back pain, GERD, Obesity, HLD who presented to the ED from the dialysis center with generalized weakness and hypotension with BP of 72/42.  Reportedly, patient had nausea, vomiting, diarrhea and left [name] quadrant abdominal pain the night prior to admission.  ED Course:  Initial vital signs showed HR of 50 beats/minute, BP 81/51 mm Hg, the RR 16 breaths/minute, and the oxygen saturation 91% on 2L and a temperature of 97.1F (36.6C).   Pertinent Labs/Diagnostics.  EKG:?Afib vs junctional bradycardia rate 40bmp. Incomplete RBB,   CXR, CT scan obtained and show no signs of infection or other abnormality. Patient given, midodrine, atropine for significant bradycardia and  500 cc of fluids and started on broad-spectrum antibiotics Vanco cefepime and Flagyl for  suspected sepsis of unknown source. Patient remained hypotensive despite  intervention as above. PCCM consulted for possible pressors.    Significant Hospital Events: Including procedures, antibiotic start and stop dates in addition to other pertinent events   06/07/2023: admit to ICU with bradycardia, started on dopamine 06/08/2023: weaned off dopamine, hypotensive again, dopamine restarted 06/09/2023:  Remains hypotensive requiring Levophed, (pt is vasculopath, doubt accuracy of cuff BP as pt is awake, alert, and asymptomatic).  Attempted Arterial line but unsuccessful, Vascular Surgery consulted to assist with placement. Nephrology consulted, received HD. 06/10/2023: Continues to require Levophed (6 mcg currently), when Levo weaned down does exhibit  some Bradycardia in the upper 50's.  Cardiology considering R Heart Cath today. Holding on transfer to [facility] for pacer at this time. 06/11/2023: Levophed weaning down, this AM. LHC/RHC with mild-moderate non-obstructive CAD, patent proximal RCA stent, mild-moderately elevated L heart filling pressures, severely elevated R heart/pulm artery pressures, mildly reduced CO/CI. Episode of chest pressure with HD, resolved with morphine. 06/12/2023: Additional episodes of chest tightness/pressure overnight (0150 and 0554). EKG without changes, trops flat. Hypertensive to 227/80, then with NE off HR dropped to 29.  Droxidopa was eventually stopped. 06/13/2023: No events overnight, no chest pain or hypertension. Remains on Levophed.  Decrease Midodrine to 5 mg TID and restart Droxidopa, increase Florinef and stop Solu-cortef in attempts to wean Levophed off. Obtain CT Chest to rule out Pulmonary pathology for etiology of PH. 06/14/2023: chest pain yesterday. Re-started on levophed over night. CT shows no acute finding of ILD. Droxidopa re-started yesterday. 06/15/2023: completed dialysis yesterday. Re-started again on levophed.  06/16/23- met with cardiology to discuss treatment options.  At this time patient remains severely hypotensive on maximal medical management. Her Renal impairment and hypotension limit therapy.  Recommendation for palliative care at this time due to poor prognosis. 06/17/23- patient remains hypotensive with poor prognosis. She is requiring less vasopressor support.  06/18/23- patient is off vasopressors.  Still has overall very poor prognosis.  Recommendation for palliative/hospice.    Interim timeframe since above events - hypotension remains an issue.  Patient on midodrine, Florinef and droxidopa with persistently soft BP's.   Assessment/Plan:   Principal Problem:   Symptomatic bradycardia Active Problems:   Hypotension   Permanent atrial fibrillation (HCC)   End stage renal  disease (HCC)   Bradycardia   Morbid obesity (HCC)   Severe pulmonary hypertension (HCC)   Goals of care, counseling/discussion    Circulatory shock (suspected cardiogenic shock) - resolved Acute on chronic hypotension:  Dysautonomia Weaned off of IV Levophed drip on 06/16/2024. BP is still low and has been fluctuating - however, pt shows no signs of inadequate perfusion  -- Continue midodrine, fludrocortisone and droxidopa. --BP's in 70's/50's, MAP 50+ are acceptable -- peripheral BP's are falsely low.  A-line pressures in ICU were significantly higher and pt does NOT show signs of poor perfusion.  Mentation is normal. --Monitor mental status as sign of adequate perfusion  7/25 - Discussed droxidopa with clinical pharmacist and cardiologist.   It was started with intent to titrate dose up, with hopes to wean down midodrine dose. --Increase droxidopa 100 >> 200 mg BID --Pt should be upright as much as possible for droxidopa to be effective -- OOB to chair --Try compression stockings, thigh-high if pt will tolerate  Persistent atrial fibrillation, bradycardia: Seen by EP cardiologist and she is deemed not to be a candidate for pacemaker at this time.  Not on any rate control medications because of bradycardia. --Continue Eliquis  CAD s/p RCA PCI, severe pulmonary hypertension, moderate to severe TR: S/p right and left heart cath on 06/11/2023 showed patent RCA stent, mid to moderate nonobstructive CAD, severely elevated right heart and pulmonary artery pressures.  ESRD on hemodialysis anemia of CKD:  Nephrology following for dialysis Ultrafiltration has been limited by hypotension  Left leg pain (popliteal fossa) -- pt on Eliquis, unlikely DVT but will evaluate.  Baker's cyst also in differential. --US doppler of LLE ordered - follow results  Hypokalemia: Improved  Chronic hyponatremia: Asymptomatic.  Monitor BMP  Type II DM: Continue Semglee and NovoLog as needed for  hyperglycemia. Hemoglobin A1c was 5.8 on 06/07/2023  Thrombocytopenia: fluctuating platelet count but overall improved.  Other comorbidities include chronic debility, chronic low back pain, left knee arthritis  Morbid obesity - Body mass index is 45.45 kg/m. Complicates overall care and prognosis.  Recommend lifestyle modifications including physical activity and diet for weight loss and overall long-term health.   Diet Order             Diet Carb Modified Fluid consistency: Thin; Room service appropriate? Yes; Fluid restriction: 1200 mL Fluid  Diet effective now                   Consultants: Cardiology PCCM Nephrology Palliative care  Procedures: None   Medications:    apixaban  5 mg Oral BID   droxidopa  200 mg Oral TID WC   epoetin (EPOGEN/PROCRIT) injection  4,000 Units Intravenous Q T,Th,Sa-HD   ezetimibe  10 mg Oral Daily   feeding supplement (NEPRO CARB STEADY)  237 mL Oral TID BM   fludrocortisone  0.2 mg Oral BID   insulin aspart  0-20 Units Subcutaneous TID WC   insulin aspart  0-5 Units Subcutaneous QHS   insulin glargine-yfgn  5 Units Subcutaneous Daily   loratadine  10 mg Oral Daily   midodrine  10 mg Oral TID WC   multivitamin  1 tablet Oral QHS   polyethylene glycol  17 g Oral BID   pregabalin  100 mg Oral QHS   pregabalin  50 mg Oral BID   rosuvastatin  10 mg Oral Daily   senna-docusate  1 tablet Oral BID   sodium chloride flush  3 mL Intravenous Q12H  Continuous Infusions:  sodium chloride Stopped (06/10/23 1900)   sodium chloride     anticoagulant sodium citrate       Anti-infectives (From admission, onward)    Start     Dose/Rate Route Frequency Ordered Stop   06/10/23 1200  vancomycin (VANCOCIN) IVPB 1000 mg/200 mL premix  Status:  Discontinued       Placed in "Followed by" Linked Group   1,000 mg 200 mL/hr over 60 Minutes Intravenous Every T-Th-Sa (Hemodialysis) 06/08/23 1058 06/09/23 1029   06/08/23 1145  vancomycin (VANCOCIN)  IVPB 1000 mg/200 mL premix       Placed in "Followed by" Linked Group   1,000 mg 200 mL/hr over 60 Minutes Intravenous  Once 06/08/23 1058 06/08/23 1655   06/08/23 1100  piperacillin-tazobactam (ZOSYN) IVPB 2.25 g  Status:  Discontinued        2.25 g 100 mL/hr over 30 Minutes Intravenous Every 8 hours 06/08/23 1058 06/09/23 1029   06/07/23 1400  metroNIDAZOLE (FLAGYL) IVPB 500 mg        500 mg 100 mL/hr over 60 Minutes Intravenous  Once 06/07/23 1346 06/07/23 1652   06/07/23 1400  ceFEPIme (MAXIPIME) 2 g in sodium chloride 0.9 % 100 mL IVPB        2 g 200 mL/hr over 30 Minutes Intravenous  Once 06/07/23 1349 06/07/23 1512   06/07/23 1400  vancomycin (VANCOCIN) IVPB 1000 mg/200 mL premix        1,000 mg 200 mL/hr over 60 Minutes Intravenous  Once 06/07/23 1349 06/07/23 1652              Family Communication/Anticipated D/C date and plan/Code Status   DVT prophylaxis: Place TED hose Start: 06/27/23 0849 SCDs Start: 06/11/23 1358 apixaban (ELIQUIS) tablet 5 mg     Code Status: Full Code  Family Communication: None Disposition Plan: Plan to discharge to SNF   Status is: Inpatient Remains inpatient appropriate because: Ongoing hypotension     Subjective:   Pt seen in dialysis this AM.  She was sleeping, woke to voice.  Denies acute complaints.     Objective:     Vitals:   06/28/23 1129 06/28/23 1150 06/28/23 1200 06/28/23 1315  BP: (!) 79/44 92/77 (!) 91/41 (!) 95/49  Pulse: (!) 52 (!) 55 (!) 58 (!) 55  Resp: 20 14 20 19   Temp: 97.8 F (36.6 C)   97.6 F (36.4 C)  TempSrc: Axillary   Oral  SpO2: 98% 100% 99% 99%  Weight: 120.1 kg     Height:       No data found.   Intake/Output Summary (Last 24 hours) at 06/28/2023 1404 Last data filed at 06/28/2023 1150 Gross per 24 hour  Intake 50 ml  Output 1100 ml  Net -1050 ml   Filed Weights   06/28/23 0500 06/28/23 0746 06/28/23 1129  Weight: 121.6 kg 120.6 kg 120.1 kg    Exam:  General exam:  sleeping in dialysis,woke to voice, no acute distress, obese HEENT: moist mucus membranes, hearing grossly normal  Respiratory system: CTAB diminished bases, no wheezes, rales or rhonchi, normal respiratory effort. Cardiovascular system: normal S1/S2, RRR, LUE fistula noted Gastrointestinal system: soft, NT, ND Central nervous system: A&O x 3. no gross focal neurologic deficits, normal speech Extremities: LUE fistula, no BLE edema Skin: dry, intact, normal temperature Psychiatry: normal mood, congruent affect, judgement and insight appear normal     Data Reviewed:   I have personally reviewed following labs and imaging studies:  Labs: Labs show the following:   Basic Metabolic Panel: Recent Labs  Lab 06/23/23 0455 06/24/23 0805 06/25/23 0808 06/26/23 0425 06/28/23 0756  NA 135 135 135 138 137  K 4.6 4.9 4.5 4.8 4.4  CL 96* 97* 100 100 99  CO2 25 25 23 24 23   GLUCOSE 114* 127* 131* 102* 125*  BUN 60* 86* 62* 73* 73*  CREATININE 6.21* 7.56* 6.63* 7.67* 7.66*  CALCIUM 8.5* 8.2* 8.0* 8.0* 8.0*  PHOS 5.2* 5.3* 4.0 5.6* 6.0*   GFR Estimated Creatinine Clearance: 7.6 mL/min (A) (by C-G formula based on SCr of 7.66 mg/dL (H)). Liver Function Tests: Recent Labs  Lab 06/23/23 0455 06/24/23 0805 06/25/23 0808 06/26/23 0425 06/28/23 0756  AST  --   --  28  --   --   ALT  --   --  25  --   --   ALKPHOS  --   --  46  --   --   BILITOT  --   --  0.5  --   --   PROT  --   --  6.7  --   --   ALBUMIN 3.2* 3.1* 3.2*  3.1* 3.3* 3.3*   No results for input(s): "LIPASE", "AMYLASE" in the last 168 hours. No results for input(s): "AMMONIA" in the last 168 hours. Coagulation profile No results for input(s): "INR", "PROTIME" in the last 168 hours.  CBC: Recent Labs  Lab 06/24/23 0805 06/25/23 0808 06/26/23 0425 06/27/23 0528 06/28/23 0756  WBC 10.3 9.9 11.9* 10.5 10.1  HGB 9.6* 10.1* 10.5* 10.3* 10.5*  HCT 31.4* 33.7* 34.4* 34.1* 34.5*  MCV 103.0* 108.4* 104.6* 104.0*  105.8*  PLT 155 118* 153 138* 149*   Cardiac Enzymes: No results for input(s): "CKTOTAL", "CKMB", "CKMBINDEX", "TROPONINI" in the last 168 hours. BNP (last 3 results) No results for input(s): "PROBNP" in the last 8760 hours. CBG: Recent Labs  Lab 06/26/23 2117 06/27/23 0728 06/27/23 1136 06/27/23 1644 06/27/23 2150  GLUCAP 139* 96 102* 127* 147*   D-Dimer: No results for input(s): "DDIMER" in the last 72 hours. Hgb A1c: No results for input(s): "HGBA1C" in the last 72 hours. Lipid Profile: No results for input(s): "CHOL", "HDL", "LDLCALC", "TRIG", "CHOLHDL", "LDLDIRECT" in the last 72 hours. Thyroid function studies: No results for input(s): "TSH", "T4TOTAL", "T3FREE", "THYROIDAB" in the last 72 hours.  Invalid input(s): "FREET3" Anemia work up: No results for input(s): "VITAMINB12", "FOLATE", "FERRITIN", "TIBC", "IRON", "RETICCTPCT" in the last 72 hours. Sepsis Labs: Recent Labs  Lab 06/25/23 0808 06/26/23 0425 06/27/23 0528 06/27/23 0944 06/28/23 0756  WBC 9.9 11.9* 10.5  --  10.1  LATICACIDVEN  --   --   --  0.7  --     Microbiology No results found for this or any previous visit (from the past 240 hour(s)).  Procedures and diagnostic studies:  US Venous Img [name] Unilateral Left (DVT)  Result Date: 06/26/2023 CLINICAL DATA:  Left [name] extremity pain. EXAM: LEFT [name] EXTREMITY VENOUS DOPPLER ULTRASOUND TECHNIQUE: Gray-scale sonography with graded compression, as well as color Doppler and duplex ultrasound were performed to evaluate the [name] extremity deep venous systems from the level of the common femoral vein and including the common femoral, femoral, profunda femoral, popliteal and calf veins including the posterior tibial, peroneal and gastrocnemius veins when visible. The superficial great saphenous vein was also interrogated. Spectral Doppler was utilized to evaluate flow at rest and with distal augmentation maneuvers in the common femoral, femoral and  popliteal veins. COMPARISON:  None Available. FINDINGS: Contralateral Common Femoral Vein: Respiratory phasicity is normal and symmetric with the symptomatic side. No evidence of thrombus. Normal compressibility. Common Femoral Vein: No evidence of thrombus. Normal compressibility, respiratory phasicity and response to augmentation. Saphenofemoral Junction: No evidence of thrombus. Normal compressibility and flow on color Doppler imaging. Profunda Femoral Vein: No evidence of thrombus. Normal compressibility and flow on color Doppler imaging. Femoral Vein: No evidence of thrombus. Proximal and mid femoral veins are normally patent. The distal thigh segment of the femoral vein was difficult to visualize. Normal compressibility, respiratory phasicity and response to augmentation. Popliteal Vein: No evidence of thrombus. Normal compressibility, respiratory phasicity and response to augmentation. Calf Veins: No evidence of thrombus. Normal compressibility and flow on color Doppler imaging. Superficial Great Saphenous Vein: No evidence of thrombus. Normal compressibility. Venous Reflux:  None. Other Findings: No evidence of superficial thrombophlebitis or abnormal fluid collection. IMPRESSION: No evidence of left [name] extremity deep venous thrombosis. Electronically Signed   By: Irish Lack M.D.   On: 06/26/2023 16:35         LOS: 21 days   Pennie Banter, DO  [facility]   Pager on [email or web address]. If 7PM-7AM, please contact night-coverage at [email or web address]  06/28/2023, 2:04 PM

## 2023-06-28 NOTE — Progress Notes (Signed)
[facility]  Dialysis Note   Subjective:   Seen and examined on hemodialysis treatment. Tolerating/not tolerating treatment.  UF goal    HEMODIALYSIS FLOWSHEET:  Blood Flow Rate (mL/min): 300 mL/min Arterial Pressure (mmHg): -90 mmHg Venous Pressure (mmHg): 160 mmHg TMP (mmHg): 12 mmHg Ultrafiltration Rate (mL/min): 0 mL/min Dialysate Flow Rate (mL/min): 300 ml/min Dialysis Fluid Bolus: Normal Saline Bolus Amount (mL): 200 mL (DIALYZER FLUSH)    Objective:  Vital signs in last 24 hours:  Temp:  [97.7 F (36.5 C)-98.4 F (36.9 C)] 97.8 F (36.6 C) (07/27 0746) Pulse Rate:  [45-90] 52 (07/27 1100) Resp:  [13-23] 20 (07/27 1100) BP: (80-108)/(38-57) 96/46 (07/27 1100) SpO2:  [48 %-100 %] 100 % (07/27 1100) Weight:  [120.6 kg-121.6 kg] 120.6 kg (07/27 0746)  Weight change: 3 kg Filed Weights   06/27/23 0500 06/28/23 0500 06/28/23 0746  Weight: 117 kg 121.6 kg 120.6 kg    Intake/Output: I/O last 3 completed shifts: In: 410 [P.O.:410] Out: -    Intake/Output this shift:  No intake/output data recorded.  Physical Exam: General: NAD,   Head: Normocephalic, atraumatic. Moist oral mucosal membranes  Eyes: Anicteric, PERRL  Neck: Supple, trachea midline  Lungs:  Clear to auscultation  Heart: Regular rate and rhythm  Abdomen:  Soft, nontender,   Extremities: 1+ peripheral edema.  Neurologic: Nonfocal, moving all four extremities  Skin: No lesions  Access: AV fistula    Basic Metabolic Panel: Recent Labs  Lab 06/23/23 0455 06/24/23 0805 06/25/23 0808 06/26/23 0425 06/28/23 0756  NA 135 135 135 138 137  K 4.6 4.9 4.5 4.8 4.4  CL 96* 97* 100 100 99  CO2 25 25 23 24 23   GLUCOSE 114* 127* 131* 102* 125*  BUN 60* 86* 62* 73* 73*  CREATININE 6.21* 7.56* 6.63* 7.67* 7.66*  CALCIUM 8.5* 8.2* 8.0* 8.0* 8.0*  PHOS 5.2* 5.3* 4.0 5.6* 6.0*    Liver Function Tests: Recent Labs  Lab 06/23/23 0455 06/24/23 0805 06/25/23 0808 06/26/23 0425  06/28/23 0756  AST  --   --  28  --   --   ALT  --   --  25  --   --   ALKPHOS  --   --  46  --   --   BILITOT  --   --  0.5  --   --   PROT  --   --  6.7  --   --   ALBUMIN 3.2* 3.1* 3.2*  3.1* 3.3* 3.3*   No results for input(s): "LIPASE", "AMYLASE" in the last 168 hours. No results for input(s): "AMMONIA" in the last 168 hours.  CBC: Recent Labs  Lab 06/24/23 0805 06/25/23 0808 06/26/23 0425 06/27/23 0528 06/28/23 0756  WBC 10.3 9.9 11.9* 10.5 10.1  HGB 9.6* 10.1* 10.5* 10.3* 10.5*  HCT 31.4* 33.7* 34.4* 34.1* 34.5*  MCV 103.0* 108.4* 104.6* 104.0* 105.8*  PLT 155 118* 153 138* 149*    Cardiac Enzymes: No results for input(s): "CKTOTAL", "CKMB", "CKMBINDEX", "TROPONINI" in the last 168 hours.  BNP: Invalid input(s): "POCBNP"  CBG: Recent Labs  Lab 06/26/23 2117 06/27/23 0728 06/27/23 1136 06/27/23 1644 06/27/23 2150  GLUCAP 139* 96 102* 127* 147*    Microbiology: Results for orders placed or performed during the hospital encounter of 06/07/23  Blood culture (routine x 2)     Status: None   Collection Time: 06/07/23 12:54 PM   Specimen: BLOOD  Result Value Ref Range Status   Specimen  Description BLOOD RIGHT ANTECUBITAL  Final   Special Requests   Final    BOTTLES DRAWN AEROBIC AND ANAEROBIC Blood Culture adequate volume   Culture   Final    NO GROWTH 5 DAYS Performed at [facility], [address] [place], [place] [address]    Report Status 06/12/2023 FINAL  Final  Blood culture (routine x 2)     Status: None   Collection Time: 06/07/23 12:54 PM   Specimen: BLOOD  Result Value Ref Range Status   Specimen Description BLOOD BLOOD RIGHT FOREARM  Final   Special Requests   Final    BOTTLES DRAWN AEROBIC AND ANAEROBIC Blood Culture adequate volume   Culture   Final    NO GROWTH 5 DAYS Performed at [facility], [address] [place], [place] [address]    Report Status 06/12/2023 FINAL  Final  MRSA Next Gen by PCR, Nasal      Status: None   Collection Time: 06/07/23 10:18 PM   Specimen: Nasal Mucosa; Nasal Swab  Result Value Ref Range Status   MRSA by PCR Next Gen NOT DETECTED NOT DETECTED Final    Comment: (NOTE) The GeneXpert MRSA Assay (FDA approved for NASAL specimens only), is one component of a comprehensive MRSA colonization surveillance program. It is not intended to diagnose MRSA infection nor to guide or monitor treatment for MRSA infections. Test performance is not FDA approved in patients less than 53 years old. Performed at [facility], [address] [place], [place] [address]     Coagulation Studies: No results for input(s): "LABPROT", "INR" in the last 72 hours.  Urinalysis: No results for input(s): "COLORURINE", "LABSPEC", "PHURINE", "GLUCOSEU", "HGBUR", "BILIRUBINUR", "KETONESUR", "PROTEINUR", "UROBILINOGEN", "NITRITE", "LEUKOCYTESUR" in the last 72 hours.  Invalid input(s): "APPERANCEUR"    Imaging: US Venous Img Lower Unilateral Left (DVT)  Result Date: 06/26/2023 CLINICAL DATA:  Left lower extremity pain. EXAM: LEFT LOWER EXTREMITY VENOUS DOPPLER ULTRASOUND TECHNIQUE: Gray-scale sonography with graded compression, as well as color Doppler and duplex ultrasound were performed to evaluate the lower extremity deep venous systems from the level of the common femoral vein and including the common femoral, femoral, profunda femoral, popliteal and calf veins including the posterior tibial, peroneal and gastrocnemius veins when visible. The superficial great saphenous vein was also interrogated. Spectral Doppler was utilized to evaluate flow at rest and with distal augmentation maneuvers in the common femoral, femoral and popliteal veins. COMPARISON:  None Available. FINDINGS: Contralateral Common Femoral Vein: Respiratory phasicity is normal and symmetric with the symptomatic side. No evidence of thrombus. Normal compressibility. Common Femoral Vein: No evidence of thrombus. Normal  compressibility, respiratory phasicity and response to augmentation. Saphenofemoral Junction: No evidence of thrombus. Normal compressibility and flow on color Doppler imaging. Profunda Femoral Vein: No evidence of thrombus. Normal compressibility and flow on color Doppler imaging. Femoral Vein: No evidence of thrombus. Proximal and mid femoral veins are normally patent. The distal thigh segment of the femoral vein was difficult to visualize. Normal compressibility, respiratory phasicity and response to augmentation. Popliteal Vein: No evidence of thrombus. Normal compressibility, respiratory phasicity and response to augmentation. Calf Veins: No evidence of thrombus. Normal compressibility and flow on color Doppler imaging. Superficial Great Saphenous Vein: No evidence of thrombus. Normal compressibility. Venous Reflux:  None. Other Findings: No evidence of superficial thrombophlebitis or abnormal fluid collection. IMPRESSION: No evidence of left lower extremity deep venous thrombosis. Electronically Signed   By: Irish Lack M.D.   On: 06/26/2023  16:35     Medications:    sodium chloride Stopped (06/10/23 1900)   sodium chloride     anticoagulant sodium citrate      apixaban  5 mg Oral BID   droxidopa  200 mg Oral TID WC   epoetin (EPOGEN/PROCRIT) injection  4,000 Units Intravenous Q T,Th,Sa-HD   ezetimibe  10 mg Oral Daily   feeding supplement (NEPRO CARB STEADY)  237 mL Oral TID BM   fludrocortisone  0.2 mg Oral BID   insulin aspart  0-20 Units Subcutaneous TID WC   insulin aspart  0-5 Units Subcutaneous QHS   insulin glargine-yfgn  5 Units Subcutaneous Daily   loratadine  10 mg Oral Daily   midodrine  10 mg Oral TID WC   multivitamin  1 tablet Oral QHS   polyethylene glycol  17 g Oral BID   pregabalin  100 mg Oral QHS   pregabalin  50 mg Oral BID   rosuvastatin  10 mg Oral Daily   senna-docusate  1 tablet Oral BID   sodium chloride flush  3 mL Intravenous Q12H   sodium chloride,  acetaminophen, alteplase, alum & mag hydroxide-simeth, anticoagulant sodium citrate, bisacodyl, docusate sodium, guaiFENesin-dextromethorphan, heparin, HYDROcodone-acetaminophen, lidocaine (PF), lidocaine-prilocaine, morphine injection, nitroGLYCERIN, ondansetron (ZOFRAN) IV, mouth rinse, pentafluoroprop-tetrafluoroeth, polyvinyl alcohol, sodium chloride flush  Assessment/ Plan:  Jacqueline [name] is a 77 y.o.  female  with a PMHx of ESRD on HD TTS at [facility], diabetes mellitus type 2, chronic hypotension, peripheral arterial disease, obstructive sleep apnea, GERD, obesity, hyperlipidemia, anemia chronic kidney disease, secondary hyperparathyroidism, who was admitted to [facility] [place] on 06/07/2023 for evaluation of generalized weakness.  Practice schedule Dialysis Center Access weight   End Stage Renal Disease on hemodialysis: dialysis schedule. Last hemodialysis. Tolerated or complications. Next scheduled dialysis is on Tuesday. Access status.  No results found for: "POTASSIUM"  Intake/Output Summary (Last 24 hours) at 06/28/2023 1107 Last data filed at 06/27/2023 1945 Gross per 24 hour  Intake 290 ml  Output --  Net 290 ml    2. Hypertension with chronic kidney disease: with complications of congestive heart failure.  Blood pressure is marginal at this time. BP (!) 96/46   Pulse (!) 52   Temp 97.8 F (36.6 C) (Oral)   Resp 20   Ht 5\' 4"  (1.626 m)   Wt 120.6 kg   SpO2 100%   BMI 45.64 kg/m   3. Anemia of chronic kidney disease/ kidney injury/chronic disease/acute blood loss: hemoglobin now at 10.5 g.. Normocytic/macrocytic/microcytic. ESA exposure.  Lab Results  Component Value Date   HGB 10.5 (L) 06/28/2023    4. Secondary Hyperparathyroidism: with outpatient labs: PTH 388, phosphorus 6, calcium 8 on date.  Phosphate binders:  Calcimimetics:  Vitamin D agent:  Lab Results  Component Value Date   PTH 388.6 (H) 01/17/2014   CALCIUM 8.0 (L) 06/28/2023   CAION 0.92 (L)  06/11/2023   PHOS 6.0 (H) 06/28/2023    Will monitor closely.    LOS: 21 Jacqueline [name] 7/27/202411:07 AM

## 2023-06-28 NOTE — Progress Notes (Signed)
Hemodialysis note  Received patient in bed to unit. Alert and oriented.  Informed consent signed and in chart.  Treatment initiated: 0755 Treatment completed: 1129  Patient tolerated well. Transported back to room, alert without acute distress.  Report given to patient's RN.   Access used: LUA AVF Access issues: none  Total UF removed: 1.1 L Medication(s) given:  Fludrocortisone 0.2 mg tab PO, Epogen 4000 units IV, Albumin 25% solution 12.5 g IV, Midodrine 10 mg tab PO, Droxidopa 200 mg PO   Post HD weight: 120.1 kg   Wolfgang Phoenix [facility]

## 2023-06-28 NOTE — Progress Notes (Signed)
To hemodialysis via bed. Am Midodrine and Northera given as ordered. Pt awake and alert.

## 2023-06-29 DIAGNOSIS — R001 Bradycardia, unspecified: Secondary | ICD-10-CM | POA: Diagnosis not present

## 2023-06-29 LAB — GLUCOSE, CAPILLARY
Glucose-Capillary: 93 mg/dL (ref 70–99)
Glucose-Capillary: 107 mg/dL — ABNORMAL HIGH (ref 70–99)
Glucose-Capillary: 154 mg/dL — ABNORMAL HIGH (ref 70–99)
Glucose-Capillary: 95 mg/dL (ref 70–99)

## 2023-06-29 MED ORDER — ONDANSETRON HCL 4 MG/2ML IJ SOLN
4.0000 mg | Freq: Once | INTRAMUSCULAR | Status: AC
  Administered 2023-06-29: 4 mg via INTRAVENOUS
  Filled 2023-06-29: qty 2

## 2023-06-29 NOTE — Progress Notes (Signed)
Progress Note    Jacqueline [name]  HYQ:[id number] DOB: 1946-09-04  DOA: 06/07/2023 PCP: Macky Lower, MD      Brief Narrative:    Medical records reviewed and are as summarized below:  Jacqueline [name] is a 77 y.o. female with PMHx significant for HTN, chronic hypotension, type II DM, ESRD on HD TTS, Chronic debility, CAD s/p stent, PAF, OSA, PAD, Left knee arthritis, Chronic lower back pain, GERD, Obesity, HLD who presented to the ED from the dialysis center with generalized weakness and hypotension with BP of 72/42.  Reportedly, patient had nausea, vomiting, diarrhea and left lower quadrant abdominal pain the night prior to admission.  ED Course:  Initial vital signs showed HR of 50 beats/minute, BP 81/51 mm Hg, the RR 16 breaths/minute, and the oxygen saturation 91% on 2L and a temperature of 97.11F (36.6C).   Pertinent Labs/Diagnostics.  EKG:?Afib vs junctional bradycardia rate 40bmp. Incomplete RBB,   CXR, CT scan obtained and show no signs of infection or other abnormality. Patient given, midodrine, atropine for significant bradycardia and  500 cc of fluids and started on broad-spectrum antibiotics Vanco cefepime and Flagyl for  suspected sepsis of unknown source. Patient remained hypotensive despite  intervention as above. PCCM consulted for possible pressors.    Significant Hospital Events: Including procedures, antibiotic start and stop dates in addition to other pertinent events   06/07/2023: admit to ICU with bradycardia, started on dopamine 06/08/2023: weaned off dopamine, hypotensive again, dopamine restarted 06/09/2023:  Remains hypotensive requiring Levophed, (pt is vasculopath, doubt accuracy of cuff BP as pt is awake, alert, and asymptomatic).  Attempted Arterial line but unsuccessful, Vascular Surgery consulted to assist with placement. Nephrology consulted, received HD. 06/10/2023: Continues to require Levophed (6 mcg currently), when Levo weaned down does exhibit  some Bradycardia in the upper 50's.  Cardiology considering R Heart Cath today. Holding on transfer to [facility] for pacer at this time. 06/11/2023: Levophed weaning down, this AM. LHC/RHC with mild-moderate non-obstructive CAD, patent proximal RCA stent, mild-moderately elevated L heart filling pressures, severely elevated R heart/pulm artery pressures, mildly reduced CO/CI. Episode of chest pressure with HD, resolved with morphine. 06/12/2023: Additional episodes of chest tightness/pressure overnight (0150 and 0554). EKG without changes, trops flat. Hypertensive to 227/80, then with NE off HR dropped to 29.  Droxidopa was eventually stopped. 06/13/2023: No events overnight, no chest pain or hypertension. Remains on Levophed.  Decrease Midodrine to 5 mg TID and restart Droxidopa, increase Florinef and stop Solu-cortef in attempts to wean Levophed off. Obtain CT Chest to rule out Pulmonary pathology for etiology of PH. 06/14/2023: chest pain yesterday. Re-started on levophed over night. CT shows no acute finding of ILD. Droxidopa re-started yesterday. 06/15/2023: completed dialysis yesterday. Re-started again on levophed.  06/16/23- met with cardiology to discuss treatment options.  At this time patient remains severely hypotensive on maximal medical management. Her Renal impairment and hypotension limit therapy.  Recommendation for palliative care at this time due to poor prognosis. 06/17/23- patient remains hypotensive with poor prognosis. She is requiring less vasopressor support.  06/18/23- patient is off vasopressors.  Still has overall very poor prognosis.  Recommendation for palliative/hospice.    Interim timeframe since above events - hypotension remains an issue.  Patient on midodrine, Florinef and droxidopa with persistently soft BP's.   Assessment/Plan:   Principal Problem:   Symptomatic bradycardia Active Problems:   Hypotension   Permanent atrial fibrillation (HCC)   End stage renal  disease (HCC)   Bradycardia   Morbid obesity (HCC)   Severe pulmonary hypertension (HCC)   Goals of care, counseling/discussion    Circulatory shock (suspected cardiogenic shock) - resolved Acute on chronic hypotension:  Dysautonomia Weaned off of IV Levophed drip on 06/16/2024. BP is still low and has been fluctuating - however, pt shows no signs of inadequate perfusion  -- Continue midodrine, fludrocortisone and droxidopa. --BP's in 70's/50's, MAP 50+ are acceptable -- peripheral BP's are falsely low.  A-line pressures in ICU were significantly higher and pt does NOT show signs of poor perfusion.  Mentation is normal. --Monitor mental status as sign of adequate perfusion  7/25 - Discussed droxidopa with clinical pharmacist and cardiologist.   It was started with intent to titrate dose up, with hopes to wean down midodrine dose. --Increase droxidopa 100 >> 200 mg BID --Pt should be upright as much as possible for droxidopa to be effective -- OOB to chair --Try compression stockings, thigh-high if pt will tolerate  Persistent atrial fibrillation, bradycardia: Seen by EP cardiologist and she is deemed not to be a candidate for pacemaker at this time.  Not on any rate control medications because of bradycardia. --Continue Eliquis  CAD s/p RCA PCI, severe pulmonary hypertension, moderate to severe TR: S/p right and left heart cath on 06/11/2023 showed patent RCA stent, mid to moderate nonobstructive CAD, severely elevated right heart and pulmonary artery pressures.  ESRD on hemodialysis anemia of CKD:  Nephrology following for dialysis Ultrafiltration has been limited by hypotension  Left leg pain (popliteal fossa) -- pt on Eliquis, unlikely DVT but will evaluate.  Baker's cyst also in differential. --US doppler of LLE ordered - follow results  Hypokalemia: Improved  Chronic hyponatremia: Asymptomatic.  Monitor BMP  Type II DM: Continue Semglee and NovoLog as needed for  hyperglycemia. Hemoglobin A1c was 5.8 on 06/07/2023  Thrombocytopenia: fluctuating platelet count but overall improved.  Other comorbidities include chronic debility, chronic low back pain, left knee arthritis  Morbid obesity - Body mass index is 45.18 kg/m. Complicates overall care and prognosis.  Recommend lifestyle modifications including physical activity and diet for weight loss and overall long-term health.   Diet Order             Diet Carb Modified Fluid consistency: Thin; Room service appropriate? Yes; Fluid restriction: 1200 mL Fluid  Diet effective now                   Consultants: Cardiology PCCM Nephrology Palliative care  Procedures: None   Medications:    apixaban  5 mg Oral BID   droxidopa  200 mg Oral TID WC   epoetin (EPOGEN/PROCRIT) injection  4,000 Units Intravenous Q T,Th,Sa-HD   ezetimibe  10 mg Oral Daily   feeding supplement (NEPRO CARB STEADY)  237 mL Oral TID BM   fludrocortisone  0.2 mg Oral BID   insulin aspart  0-20 Units Subcutaneous TID WC   insulin aspart  0-5 Units Subcutaneous QHS   insulin glargine-yfgn  5 Units Subcutaneous Daily   loratadine  10 mg Oral Daily   midodrine  10 mg Oral TID WC   multivitamin  1 tablet Oral QHS   polyethylene glycol  17 g Oral BID   pregabalin  100 mg Oral QHS   pregabalin  50 mg Oral BID   rosuvastatin  10 mg Oral Daily   senna-docusate  1 tablet Oral BID   sodium chloride flush  3 mL Intravenous Q12H  Continuous Infusions:  sodium chloride Stopped (06/10/23 1900)   sodium chloride     anticoagulant sodium citrate       Anti-infectives (From admission, onward)    Start     Dose/Rate Route Frequency Ordered Stop   06/10/23 1200  vancomycin (VANCOCIN) IVPB 1000 mg/200 mL premix  Status:  Discontinued       Placed in "Followed by" Linked Group   1,000 mg 200 mL/hr over 60 Minutes Intravenous Every T-Th-Sa (Hemodialysis) 06/08/23 1058 06/09/23 1029   06/08/23 1145  vancomycin (VANCOCIN)  IVPB 1000 mg/200 mL premix       Placed in "Followed by" Linked Group   1,000 mg 200 mL/hr over 60 Minutes Intravenous  Once 06/08/23 1058 06/08/23 1655   06/08/23 1100  piperacillin-tazobactam (ZOSYN) IVPB 2.25 g  Status:  Discontinued        2.25 g 100 mL/hr over 30 Minutes Intravenous Every 8 hours 06/08/23 1058 06/09/23 1029   06/07/23 1400  metroNIDAZOLE (FLAGYL) IVPB 500 mg        500 mg 100 mL/hr over 60 Minutes Intravenous  Once 06/07/23 1346 06/07/23 1652   06/07/23 1400  ceFEPIme (MAXIPIME) 2 g in sodium chloride 0.9 % 100 mL IVPB        2 g 200 mL/hr over 30 Minutes Intravenous  Once 06/07/23 1349 06/07/23 1512   06/07/23 1400  vancomycin (VANCOCIN) IVPB 1000 mg/200 mL premix        1,000 mg 200 mL/hr over 60 Minutes Intravenous  Once 06/07/23 1349 06/07/23 1652              Family Communication/Anticipated D/C date and plan/Code Status   DVT prophylaxis: Place TED hose Start: 06/27/23 0849 SCDs Start: 06/11/23 1358 apixaban (ELIQUIS) tablet 5 mg     Code Status: Full Code  Family Communication: None Disposition Plan: Plan to discharge to SNF   Status is: Inpatient Remains inpatient appropriate because: Ongoing hypotension     Subjective:   Pt was sleeping but woke to voice. Reports she feels well.  States she was up in chair and ambulated in hall yesterday.  No acute complaints.    Objective:     Vitals:   06/29/23 0900 06/29/23 1000 06/29/23 1100 06/29/23 1200  BP: (!) 86/49 (!) 83/52 (!) 99/51 (!) 89/47  Pulse: 61 (!) 48 60 (!) 56  Resp: 17 19 17 19   Temp:    98.4 F (36.9 C)  TempSrc:    Oral  SpO2: 100% 100% 96% 100%  Weight:      Height:       No data found.   Intake/Output Summary (Last 24 hours) at 06/29/2023 1316 Last data filed at 06/29/2023 0800 Gross per 24 hour  Intake 830 ml  Output 0 ml  Net 830 ml   Filed Weights   06/28/23 0746 06/28/23 1129 06/29/23 0500  Weight: 120.6 kg 120.1 kg 119.4 kg    Exam:  General  exam: sleeping,woke to voice, no acute distress, obese HEENT: moist mucus membranes, hearing grossly normal  Respiratory system: CTAB diminished bases, no wheezes, rales or rhonchi, normal respiratory effort. Cardiovascular system: normal S1/S2, RRR, LUE fistula noted Gastrointestinal system: soft, NT, ND Central nervous system: A&O x 3. no gross focal neurologic deficits, normal speech Extremities: LUE fistula, RUE edema with BP cuff in place, no BLE edema Skin: dry, intact, normal temperature Psychiatry: normal mood, congruent affect, judgement and insight appear normal     Data Reviewed:  I have personally reviewed following labs and imaging studies:  Labs: Labs show the following:   Basic Metabolic Panel: Recent Labs  Lab 06/23/23 0455 06/24/23 0805 06/25/23 0808 06/26/23 0425 06/28/23 0756  NA 135 135 135 138 137  K 4.6 4.9 4.5 4.8 4.4  CL 96* 97* 100 100 99  CO2 25 25 23 24 23   GLUCOSE 114* 127* 131* 102* 125*  BUN 60* 86* 62* 73* 73*  CREATININE 6.21* 7.56* 6.63* 7.67* 7.66*  CALCIUM 8.5* 8.2* 8.0* 8.0* 8.0*  PHOS 5.2* 5.3* 4.0 5.6* 6.0*   GFR Estimated Creatinine Clearance: 7.6 mL/min (A) (by C-G formula based on SCr of 7.66 mg/dL (H)). Liver Function Tests: Recent Labs  Lab 06/23/23 0455 06/24/23 0805 06/25/23 0808 06/26/23 0425 06/28/23 0756  AST  --   --  28  --   --   ALT  --   --  25  --   --   ALKPHOS  --   --  46  --   --   BILITOT  --   --  0.5  --   --   PROT  --   --  6.7  --   --   ALBUMIN 3.2* 3.1* 3.2*  3.1* 3.3* 3.3*   No results for input(s): "LIPASE", "AMYLASE" in the last 168 hours. No results for input(s): "AMMONIA" in the last 168 hours. Coagulation profile No results for input(s): "INR", "PROTIME" in the last 168 hours.  CBC: Recent Labs  Lab 06/24/23 0805 06/25/23 0808 06/26/23 0425 06/27/23 0528 06/28/23 0756  WBC 10.3 9.9 11.9* 10.5 10.1  HGB 9.6* 10.1* 10.5* 10.3* 10.5*  HCT 31.4* 33.7* 34.4* 34.1* 34.5*  MCV  103.0* 108.4* 104.6* 104.0* 105.8*  PLT 155 118* 153 138* 149*   Cardiac Enzymes: No results for input(s): "CKTOTAL", "CKMB", "CKMBINDEX", "TROPONINI" in the last 168 hours. BNP (last 3 results) No results for input(s): "PROBNP" in the last 8760 hours. CBG: Recent Labs  Lab 06/27/23 2150 06/28/23 1630 06/28/23 2135 06/29/23 0727 06/29/23 1114  GLUCAP 147* 125* 138* 93 107*   D-Dimer: No results for input(s): "DDIMER" in the last 72 hours. Hgb A1c: No results for input(s): "HGBA1C" in the last 72 hours. Lipid Profile: No results for input(s): "CHOL", "HDL", "LDLCALC", "TRIG", "CHOLHDL", "LDLDIRECT" in the last 72 hours. Thyroid function studies: No results for input(s): "TSH", "T4TOTAL", "T3FREE", "THYROIDAB" in the last 72 hours.  Invalid input(s): "FREET3" Anemia work up: No results for input(s): "VITAMINB12", "FOLATE", "FERRITIN", "TIBC", "IRON", "RETICCTPCT" in the last 72 hours. Sepsis Labs: Recent Labs  Lab 06/25/23 0808 06/26/23 0425 06/27/23 0528 06/27/23 0944 06/28/23 0756  WBC 9.9 11.9* 10.5  --  10.1  LATICACIDVEN  --   --   --  0.7  --     Microbiology No results found for this or any previous visit (from the past 240 hour(s)).  Procedures and diagnostic studies:  No results found.       LOS: 22 days   Pennie Banter, DO  [facility]   Pager on [email or web address]. If 7PM-7AM, please contact night-coverage at [email or web address]  06/29/2023, 1:16 PM

## 2023-06-29 NOTE — Progress Notes (Signed)
[facility]  PROGRESS NOTE   Subjective:   Patient seen at bedside in the ICU.  Awake and alert.  Blood pressures are still marginal. Did not tolerate fluid removal yesterday.  Was able to pull off only 1.2 L of fluid yesterday.  Objective:  Vital signs: Blood pressure (!) 99/51, pulse 60, temperature 98.7 F (37.1 C), temperature source Oral, resp. rate 17, height 5\' 4"  (1.626 m), weight 119.4 kg, SpO2 96%.  Intake/Output Summary (Last 24 hours) at 06/29/2023 1113 Last data filed at 06/29/2023 0800 Gross per 24 hour  Intake 830 ml  Output 1100 ml  Net -270 ml   Filed Weights   06/28/23 0746 06/28/23 1129 06/29/23 0500  Weight: 120.6 kg 120.1 kg 119.4 kg     Physical Exam: General:  No acute distress  Head:  Normocephalic, atraumatic. Moist oral mucosal membranes  Eyes:  Anicteric  Neck:  Supple  Lungs:   Clear to auscultation, normal effort  Heart:  S1S2 no rubs  Abdomen:   Soft, nontender, bowel sounds present  Extremities: 1+ peripheral edema.  Neurologic:  Awake, alert, following commands  Skin:  No lesions  Access:     Basic Metabolic Panel: Recent Labs  Lab 06/23/23 0455 06/24/23 0805 06/25/23 0808 06/26/23 0425 06/28/23 0756  NA 135 135 135 138 137  K 4.6 4.9 4.5 4.8 4.4  CL 96* 97* 100 100 99  CO2 25 25 23 24 23   GLUCOSE 114* 127* 131* 102* 125*  BUN 60* 86* 62* 73* 73*  CREATININE 6.21* 7.56* 6.63* 7.67* 7.66*  CALCIUM 8.5* 8.2* 8.0* 8.0* 8.0*  PHOS 5.2* 5.3* 4.0 5.6* 6.0*   GFR: Estimated Creatinine Clearance: 7.6 mL/min (A) (by C-G formula based on SCr of 7.66 mg/dL (H)).  Liver Function Tests: Recent Labs  Lab 06/23/23 0455 06/24/23 0805 06/25/23 0808 06/26/23 0425 06/28/23 0756  AST  --   --  28  --   --   ALT  --   --  25  --   --   ALKPHOS  --   --  46  --   --   BILITOT  --   --  0.5  --   --   PROT  --   --  6.7  --   --   ALBUMIN 3.2* 3.1* 3.2*  3.1* 3.3* 3.3*   No results for input(s): "LIPASE", "AMYLASE" in  the last 168 hours. No results for input(s): "AMMONIA" in the last 168 hours.  CBC: Recent Labs  Lab 06/24/23 0805 06/25/23 0808 06/26/23 0425 06/27/23 0528 06/28/23 0756  WBC 10.3 9.9 11.9* 10.5 10.1  HGB 9.6* 10.1* 10.5* 10.3* 10.5*  HCT 31.4* 33.7* 34.4* 34.1* 34.5*  MCV 103.0* 108.4* 104.6* 104.0* 105.8*  PLT 155 118* 153 138* 149*     HbA1C: Hemoglobin A1C  Date/Time Value Ref Range Status  06/12/2021 12:00 AM 6.6  Final   Hgb A1c MFr Bld  Date/Time Value Ref Range Status  06/07/2023 07:44 PM 5.8 (H) 4.8 - 5.6 % Final    Comment:    (NOTE) Pre diabetes:          5.7%-6.4%  Diabetes:              >6.4%  Glycemic control for   <7.0% adults with diabetes   09/29/2022 04:55 PM 5.2 4.8 - 5.6 % Final    Comment:    (NOTE) Pre diabetes:          5.7%-6.4%  Diabetes:              >6.4%  Glycemic control for   <7.0% adults with diabetes     Urinalysis: No results for input(s): "COLORURINE", "LABSPEC", "PHURINE", "GLUCOSEU", "HGBUR", "BILIRUBINUR", "KETONESUR", "PROTEINUR", "UROBILINOGEN", "NITRITE", "LEUKOCYTESUR" in the last 72 hours.  Invalid input(s): "APPERANCEUR"    Imaging: No results found.   Medications:    sodium chloride Stopped (06/10/23 1900)   sodium chloride     anticoagulant sodium citrate      apixaban  5 mg Oral BID   droxidopa  200 mg Oral TID WC   epoetin (EPOGEN/PROCRIT) injection  4,000 Units Intravenous Q T,Th,Sa-HD   ezetimibe  10 mg Oral Daily   feeding supplement (NEPRO CARB STEADY)  237 mL Oral TID BM   fludrocortisone  0.2 mg Oral BID   insulin aspart  0-20 Units Subcutaneous TID WC   insulin aspart  0-5 Units Subcutaneous QHS   insulin glargine-yfgn  5 Units Subcutaneous Daily   loratadine  10 mg Oral Daily   midodrine  10 mg Oral TID WC   multivitamin  1 tablet Oral QHS   polyethylene glycol  17 g Oral BID   pregabalin  100 mg Oral QHS   pregabalin  50 mg Oral BID   rosuvastatin  10 mg Oral Daily   senna-docusate   1 tablet Oral BID   sodium chloride flush  3 mL Intravenous Q12H    Assessment/ Plan:     Jacqueline [name] is a 77 y.o.  female with a PMHx of ESRD on HD TTS at [facility], diabetes mellitus type 2, chronic hypotension, peripheral arterial disease, obstructive sleep apnea, GERD, obesity, hyperlipidemia, anemia chronic kidney disease, secondary hyperparathyroidism, who was admitted to [facility] for evaluation of generalized weakness.  Associated with hypotension.  #1: End-stage renal disease: Patient had dialysis yesterday.  Was able to take off only 1.1 L yesterday.  She has been on midodrine prior to dialysis.  #2: Anemia: Will continue the anemia protocol with IV iron and Epogen at dialysis.  #3: Hypotension/cardiogenic shock: Patient is presently off of IV pressor medications.  Will continue to monitor closely.  #4: Diabetes: Patient to be continued on insulin as ordered.  Her next dialysis will be on Tuesday.  Will continue to follow.   LOS: 22 Lorain Childes, MD [facility] 7/28/202411:13 AM

## 2023-06-30 DIAGNOSIS — R001 Bradycardia, unspecified: Secondary | ICD-10-CM | POA: Diagnosis not present

## 2023-06-30 LAB — GLUCOSE, CAPILLARY
Glucose-Capillary: 71 mg/dL (ref 70–99)
Glucose-Capillary: 82 mg/dL (ref 70–99)
Glucose-Capillary: 131 mg/dL — ABNORMAL HIGH (ref 70–99)
Glucose-Capillary: 95 mg/dL (ref 70–99)
Glucose-Capillary: 149 mg/dL — ABNORMAL HIGH (ref 70–99)

## 2023-06-30 LAB — TROPONIN I (HIGH SENSITIVITY): Troponin I (High Sensitivity): 73 ng/L — ABNORMAL HIGH (ref <18)

## 2023-06-30 MED ORDER — PANTOPRAZOLE SODIUM 40 MG PO TBEC: 40.0000 mg | DELAYED_RELEASE_TABLET | Freq: Every day | ORAL | Status: DC

## 2023-06-30 NOTE — Progress Notes (Signed)
Occupational Therapy Treatment Patient Details Name: Jacqueline [name] MRN: [id number] DOB: [birth year] Today's Date: 06/30/2023   History of present illness 77 y.o female with significant PMHx significant for HTN, T2DM, ESRD on HD TTS, Chronic debility, CAD s/p stent, PAF, OSA, PAD, Left knee arthritis, Chronic lower back pain, GERD, Obesity, HLD who presented to the ED from Dialysis with generalized weakness x 4 days.   OT comments  Upon entering the room, pt supine in bed with sister present in room and agreeable to OT intervention. Min A for sit >supine and pt seated on EOB with focus on pursed lip breathing throughout session. Pt stands with min A of 2 for 5-6 side steps towards the L to [facility] with RW. Pt fatigues quickly and returns to EOB. Pt does report dizziness with BP of 82/44 while seated. Mod A for sit >supine. Call bell and all needed items within reach. Pt making progress towards goals.    Recommendations for follow up therapy are one component of a multi-disciplinary discharge planning process, led by the attending physician.  Recommendations may be updated based on patient status, additional functional criteria and insurance authorization.    Assistance Recommended at Discharge Intermittent Supervision/Assistance  Patient can return home with the following  A little help with walking and/or transfers;A lot of help with bathing/dressing/bathroom;Assist for transportation   Equipment Recommendations  Other (comment) (defer to next venue of care)       Precautions / Restrictions Precautions Precautions: Fall       Mobility Bed Mobility Overal bed mobility: Needs Assistance Bed Mobility: Supine to Sit, Sit to Supine     Supine to sit: Min assist Sit to supine: Mod assist        Transfers Overall transfer level: Needs assistance Equipment used: Rolling walker (2 wheels) Transfers: Sit to/from Stand Sit to Stand: From elevated surface, Min assist, +2 physical  assistance                 Balance Overall balance assessment: Needs assistance Sitting-balance support: Feet supported Sitting balance-Leahy Scale: Fair Sitting balance - Comments: patient occasionally leaning over on to the walker for support in sitting for comfort   Standing balance support: Bilateral upper extremity supported, Reliant on assistive device for balance, During functional activity Standing balance-Leahy Scale: Fair                             ADL either performed or assessed with clinical judgement    Extremity/Trunk Assessment Upper Extremity Assessment Upper Extremity Assessment: Generalized weakness   Lower Extremity Assessment Lower Extremity Assessment: Generalized weakness   Cervical / Trunk Assessment Cervical / Trunk Assessment: Normal    Vision Patient Visual Report: No change from baseline            Cognition Arousal/Alertness: Awake/alert Behavior During Therapy: WFL for tasks assessed/performed Overall Cognitive Status: Within Functional Limits for tasks assessed                                                     Pertinent Vitals/ Pain       Pain Assessment Pain Assessment: No/denies pain            Progress Toward Goals  OT Goals(current goals can now be found in the care plan  section)  Progress towards OT goals: Progressing toward goals     Plan Discharge plan remains appropriate;Frequency remains appropriate       AM-PAC OT "6 Clicks" Daily Activity     Outcome Measure   Help from another person eating meals?: None Help from another person taking care of personal grooming?: A Little Help from another person toileting, which includes using toliet, bedpan, or urinal?: A Lot Help from another person bathing (including washing, rinsing, drying)?: A Lot Help from another person to put on and taking off regular upper body clothing?: A Little Help from another person to put on and taking  off regular lower body clothing?: A Lot 6 Click Score: 16    End of Session Equipment Utilized During Treatment: Rolling walker (2 wheels);Oxygen  OT Visit Diagnosis: Unsteadiness on feet (R26.81)   Activity Tolerance Patient tolerated treatment well   Patient Left with call bell/phone within reach;in bed;with bed alarm set;with family/visitor present   Nurse Communication          Time: 4098-1191 OT Time Calculation (min): 18 min  Charges: OT General Charges $OT Visit: 1 Visit OT Treatments $Therapeutic Activity: 8-22 mins  Jackquline Denmark, MS, OTR/L , CBIS ascom [phone]  06/30/23, 3:36 PM

## 2023-06-30 NOTE — TOC Progression Note (Addendum)
Transition of Care [facility]) - Progression Note    Patient Details  Name: Jacqueline [name] MRN: [id number] Date of Birth: February 07, 1946  Transition of Care [facility]) CM/SW Contact  Kreg Shropshire, RN Phone Number: 06/30/2023, 10:42 AM  Clinical Narrative:     Cm reached out to Tammy at [facility] regarding bed. Tammy stated that there is still a bed available for pt. She stated yes. Cm did verify the pt has  been receiving PT/OT inpt. Tammy stated that pt has [facility] that has a nurse that comes out to facility to evaluate pt and will request PT/OT f/u. Cm does not need to do any authorization for PT/OT. Cm will continue to follow for toc needs.  Expected Discharge Plan: Long Term Nursing Home Barriers to Discharge: Continued Medical Work up  Expected Discharge Plan and Services       Living arrangements for the past 2 months: Skilled Nursing Facility Expected Discharge Date: 06/10/23                                     Social Determinants of Health (SDOH) Interventions SDOH Screenings   Food Insecurity: No Food Insecurity (06/07/2023)  Housing: Low Risk  (06/07/2023)  Transportation Needs: No Transportation Needs (06/07/2023)  Utilities: Not At Risk (06/07/2023)  Depression (PHQ2-9): Low Risk  (06/27/2021)  Financial Resource Strain: Medium Risk (06/27/2021)  Physical Activity: Inactive (06/27/2021)  Stress: No Stress Concern Present (06/27/2021)  Tobacco Use: Medium Risk (06/07/2023)    Readmission Risk Interventions    05/22/2022   12:26 PM  Readmission Risk Prevention Plan  Transportation Screening Complete  PCP or Specialist Appt within 3-5 Days Complete  HRI or Home Care Consult Complete  Social Work Consult for Recovery Care Planning/Counseling Complete  Palliative Care Screening Complete  Medication Review [employer]) Complete

## 2023-06-30 NOTE — Plan of Care (Signed)
  Problem: Education: Goal: Knowledge of General Education information will improve Description: Including pain rating scale, medication(s)/side effects and non-pharmacologic comfort measures Outcome: Progressing   Problem: Health Behavior/Discharge Planning: Goal: Ability to manage health-related needs will improve Outcome: Progressing   Problem: Clinical Measurements: Goal: Ability to maintain clinical measurements within normal limits will improve Outcome: Progressing Goal: Diagnostic test results will improve Outcome: Progressing Goal: Respiratory complications will improve Outcome: Progressing Goal: Cardiovascular complication will be avoided Outcome: Progressing   Problem: Nutrition: Goal: Adequate nutrition will be maintained Outcome: Progressing   Problem: Elimination: Goal: Will not experience complications related to bowel motility Outcome: Progressing   Problem: Pain Managment: Goal: General experience of comfort will improve Outcome: Progressing

## 2023-06-30 NOTE — Progress Notes (Addendum)
Progress Note    Jacqueline [name]  CZY:[id number] DOB: 1946-08-06  DOA: 06/07/2023 PCP: Macky Lower, MD      Brief Narrative:    Medical records reviewed and are as summarized below:  Jacqueline [name] is a 77 y.o. female with PMHx significant for HTN, chronic hypotension, type II DM, ESRD on HD TTS, Chronic debility, CAD s/p stent, PAF, OSA, PAD, Left knee arthritis, Chronic lower back pain, GERD, Obesity, HLD who presented to the ED from the dialysis center with generalized weakness and hypotension with BP of 72/42.  Reportedly, patient had nausea, vomiting, diarrhea and left lower quadrant abdominal pain the night prior to admission.  ED Course:  Initial vital signs showed HR of 50 beats/minute, BP 81/51 mm Hg, the RR 16 breaths/minute, and the oxygen saturation 91% on 2L and a temperature of 97.11F (36.6C).   Pertinent Labs/Diagnostics.  EKG:?Afib vs junctional bradycardia rate 40bmp. Incomplete RBB,   CXR, CT scan obtained and show no signs of infection or other abnormality. Patient given, midodrine, atropine for significant bradycardia and  500 cc of fluids and started on broad-spectrum antibiotics Vanco cefepime and Flagyl for  suspected sepsis of unknown source. Patient remained hypotensive despite  intervention as above. PCCM consulted for possible pressors.    Significant Hospital Events: Including procedures, antibiotic start and stop dates in addition to other pertinent events   06/07/2023: admit to ICU with bradycardia, started on dopamine 06/08/2023: weaned off dopamine, hypotensive again, dopamine restarted 06/09/2023:  Remains hypotensive requiring Levophed, (pt is vasculopath, doubt accuracy of cuff BP as pt is awake, alert, and asymptomatic).  Attempted Arterial line but unsuccessful, Vascular Surgery consulted to assist with placement. Nephrology consulted, received HD. 06/10/2023: Continues to require Levophed (6 mcg currently), when Levo weaned down does exhibit  some Bradycardia in the upper 50's.  Cardiology considering R Heart Cath today. Holding on transfer to [facility] for pacer at this time. 06/11/2023: Levophed weaning down, this AM. LHC/RHC with mild-moderate non-obstructive CAD, patent proximal RCA stent, mild-moderately elevated L heart filling pressures, severely elevated R heart/pulm artery pressures, mildly reduced CO/CI. Episode of chest pressure with HD, resolved with morphine. 06/12/2023: Additional episodes of chest tightness/pressure overnight (0150 and 0554). EKG without changes, trops flat. Hypertensive to 227/80, then with NE off HR dropped to 29.  Droxidopa was eventually stopped. 06/13/2023: No events overnight, no chest pain or hypertension. Remains on Levophed.  Decrease Midodrine to 5 mg TID and restart Droxidopa, increase Florinef and stop Solu-cortef in attempts to wean Levophed off. Obtain CT Chest to rule out Pulmonary pathology for etiology of PH. 06/14/2023: chest pain yesterday. Re-started on levophed over night. CT shows no acute finding of ILD. Droxidopa re-started yesterday. 06/15/2023: completed dialysis yesterday. Re-started again on levophed.  06/16/23- met with cardiology to discuss treatment options.  At this time patient remains severely hypotensive on maximal medical management. Her Renal impairment and hypotension limit therapy.  Recommendation for palliative care at this time due to poor prognosis. 06/17/23- patient remains hypotensive with poor prognosis. She is requiring less vasopressor support.  06/18/23- patient is off vasopressors.  Still has overall very poor prognosis.  Recommendation for palliative/hospice.    Interim timeframe since above events - hypotension remains an issue.  Patient on midodrine, Florinef and droxidopa with persistently soft BP's, but has remained stable and with no signs of poor perfusion.  Medically stable for discharge once tolerating dialysis in chair, so that she can resume outpatient  dialysis.   Assessment/Plan:   Principal Problem:   Symptomatic bradycardia Active Problems:   Hypotension   Permanent atrial fibrillation (HCC)   End stage renal disease (HCC)   Bradycardia   Morbid obesity (HCC)   Severe pulmonary hypertension (HCC)   Goals of care, counseling/discussion    Circulatory shock (suspected cardiogenic shock) - resolved Acute on chronic hypotension:  Dysautonomia Weaned off of IV Levophed drip on 06/16/2024. BP is still low and has been fluctuating - however, pt shows no signs of inadequate perfusion  -- Continue midodrine, fludrocortisone and droxidopa. --BP's in 70's/50's, MAP 50+ are acceptable -- peripheral BP's are falsely low.  A-line pressures in ICU were significantly higher and pt does NOT show signs of poor perfusion.   Mentation is normal. --Monitor mental status as sign of adequate perfusion  7/25 - Discussed droxidopa with clinical pharmacist and cardiologist.   It was started with intent to titrate dose up, with hopes to wean down midodrine dose. --Increase droxidopa 100 >> 200 mg BID --Pt should be upright as much as possible for droxidopa to be effective -- OOB to chair --Try compression stockings, thigh-high if pt will tolerate  Persistent atrial fibrillation, bradycardia: Seen by EP cardiologist and she is deemed not to be a candidate for pacemaker at this time.  Not on any rate control medications because of bradycardia. --Continue Eliquis  CAD s/p RCA PCI, severe pulmonary hypertension, moderate to severe TR: S/p right and left heart cath on 06/11/2023 showed patent RCA stent, mid to moderate nonobstructive CAD, severely elevated right heart and pulmonary artery pressures.  Atypical Chest pain / ?GERD, Dyspepsia 7/29 - pt having chest discomfort overnight and this AM, vomiting overnight -- Check troponin, EKG to rule cardiac cause, low suspicion -- Start Protonix for reflux  ESRD on hemodialysis anemia of CKD:  Nephrology  following for dialysis Ultrafiltration has been limited by hypotension  Left leg pain (popliteal fossa) -- pt on Eliquis, unlikely DVT but will evaluate.  Baker's cyst also in differential. --US doppler of LLE negative for DVT or Baker's cyst  Hypokalemia: Improved  Chronic hyponatremia: Asymptomatic.  Monitor BMP  Type II DM: Continue Semglee and NovoLog as needed for hyperglycemia. Hemoglobin A1c was 5.8 on 06/07/2023  Thrombocytopenia: fluctuating platelet count but overall improved.  Other comorbidities include chronic debility, chronic low back pain, left knee arthritis  Morbid obesity - Body mass index is 44.88 kg/m. Complicates overall care and prognosis.  Recommend lifestyle modifications including physical activity and diet for weight loss and overall long-term health.   Diet Order             Diet Carb Modified Fluid consistency: Thin; Room service appropriate? Yes; Fluid restriction: 1200 mL Fluid  Diet effective now                   Consultants: Cardiology PCCM Nephrology Palliative care  Procedures: None   Medications:    apixaban  5 mg Oral BID   droxidopa  200 mg Oral TID WC   epoetin (EPOGEN/PROCRIT) injection  4,000 Units Intravenous Q T,Th,Sa-HD   ezetimibe  10 mg Oral Daily   feeding supplement (NEPRO CARB STEADY)  237 mL Oral TID BM   fludrocortisone  0.2 mg Oral BID   insulin aspart  0-20 Units Subcutaneous TID WC   insulin aspart  0-5 Units Subcutaneous QHS   insulin glargine-yfgn  5 Units Subcutaneous Daily   loratadine  10 mg Oral Daily   midodrine  10 mg Oral TID WC   multivitamin  1 tablet Oral QHS   polyethylene glycol  17 g Oral BID   pregabalin  100 mg Oral QHS   pregabalin  50 mg Oral BID   rosuvastatin  10 mg Oral Daily   senna-docusate  1 tablet Oral BID   sodium chloride flush  3 mL Intravenous Q12H   Continuous Infusions:  sodium chloride Stopped (06/10/23 1900)   sodium chloride     anticoagulant sodium citrate        Anti-infectives (From admission, onward)    Start     Dose/Rate Route Frequency Ordered Stop   06/10/23 1200  vancomycin (VANCOCIN) IVPB 1000 mg/200 mL premix  Status:  Discontinued       Placed in "Followed by" Linked Group   1,000 mg 200 mL/hr over 60 Minutes Intravenous Every T-Th-Sa (Hemodialysis) 06/08/23 1058 06/09/23 1029   06/08/23 1145  vancomycin (VANCOCIN) IVPB 1000 mg/200 mL premix       Placed in "Followed by" Linked Group   1,000 mg 200 mL/hr over 60 Minutes Intravenous  Once 06/08/23 1058 06/08/23 1655   06/08/23 1100  piperacillin-tazobactam (ZOSYN) IVPB 2.25 g  Status:  Discontinued        2.25 g 100 mL/hr over 30 Minutes Intravenous Every 8 hours 06/08/23 1058 06/09/23 1029   06/07/23 1400  metroNIDAZOLE (FLAGYL) IVPB 500 mg        500 mg 100 mL/hr over 60 Minutes Intravenous  Once 06/07/23 1346 06/07/23 1652   06/07/23 1400  ceFEPIme (MAXIPIME) 2 g in sodium chloride 0.9 % 100 mL IVPB        2 g 200 mL/hr over 30 Minutes Intravenous  Once 06/07/23 1349 06/07/23 1512   06/07/23 1400  vancomycin (VANCOCIN) IVPB 1000 mg/200 mL premix        1,000 mg 200 mL/hr over 60 Minutes Intravenous  Once 06/07/23 1349 06/07/23 1652              Family Communication/Anticipated D/C date and plan/Code Status   DVT prophylaxis: Place TED hose Start: 06/27/23 0849 SCDs Start: 06/11/23 1358 apixaban (ELIQUIS) tablet 5 mg     Code Status: Full Code  Family Communication: None Disposition Plan: Plan to discharge to SNF   Status is: Inpatient Remains inpatient appropriate because: Ongoing hypotension     Subjective:   Pt was awake having breakfast this AM.  She reports having some central check pain and vomiting overnight.  Still intermittent this AM.  With breakfast, she reports feeling like foods don't go all the way down after she swallows.  She takes sips of soda and has a lot of belching immediately.  Endorses history of reflux. No dizziness  lightheadedness or shortness of breath associated.   Objective:     Vitals:   06/30/23 0714 06/30/23 0900 06/30/23 1200 06/30/23 1234  BP:  (!) 93/53 (!) 127/48   Pulse: 63 63 65   Resp: (!) 25 17 18    Temp:    98.9 F (37.2 C)  TempSrc:    Axillary  SpO2: 97% 95% 99%   Weight:      Height:       No data found.   Intake/Output Summary (Last 24 hours) at 06/30/2023 1341 Last data filed at 06/29/2023 2100 Gross per 24 hour  Intake 590 ml  Output 0 ml  Net 590 ml   Filed Weights   06/28/23 1129 06/29/23 0500 06/30/23 0500  Weight: 120.1 kg  119.4 kg 118.6 kg    Exam:  General exam: awake, alert, no acute distress, obese HEENT: moist mucus membranes, hearing grossly normal  Respiratory system: CTAB diminished bases, no wheezes, rales or rhonchi, normal respiratory effort. Cardiovascular system: normal S1/S2, RRR, LUE fistula noted Gastrointestinal system: soft, NT, ND Central nervous system: A&O x 3. no gross focal neurologic deficits, normal speech Extremities: LUE fistula, RUE edema with BP cuff in place, no BLE edema Skin: dry, intact, normal temperature Psychiatry: normal mood, congruent affect, judgement and insight appear normal     Data Reviewed:   I have personally reviewed following labs and imaging studies:  Labs: Labs show the following:   Basic Metabolic Panel: Recent Labs  Lab 06/24/23 0805 06/25/23 0808 06/26/23 0425 06/28/23 0756 06/30/23 0325  NA 135 135 138 137 141  K 4.9 4.5 4.8 4.4 4.9  CL 97* 100 100 99 101  CO2 25 23 24 23 27   GLUCOSE 127* 131* 102* 125* 108*  BUN 86* 62* 73* 73* 64*  CREATININE 7.56* 6.63* 7.67* 7.66* 7.37*  CALCIUM 8.2* 8.0* 8.0* 8.0* 8.3*  PHOS 5.3* 4.0 5.6* 6.0* 5.5*   GFR Estimated Creatinine Clearance: 7.8 mL/min (A) (by C-G formula based on SCr of 7.37 mg/dL (H)). Liver Function Tests: Recent Labs  Lab 06/24/23 0805 06/25/23 0808 06/26/23 0425 06/28/23 0756 06/30/23 0325  AST  --  28  --   --    --   ALT  --  25  --   --   --   ALKPHOS  --  46  --   --   --   BILITOT  --  0.5  --   --   --   PROT  --  6.7  --   --   --   ALBUMIN 3.1* 3.2*  3.1* 3.3* 3.3* 3.3*   No results for input(s): "LIPASE", "AMYLASE" in the last 168 hours. No results for input(s): "AMMONIA" in the last 168 hours. Coagulation profile No results for input(s): "INR", "PROTIME" in the last 168 hours.  CBC: Recent Labs  Lab 06/24/23 0805 06/25/23 0808 06/26/23 0425 06/27/23 0528 06/28/23 0756  WBC 10.3 9.9 11.9* 10.5 10.1  HGB 9.6* 10.1* 10.5* 10.3* 10.5*  HCT 31.4* 33.7* 34.4* 34.1* 34.5*  MCV 103.0* 108.4* 104.6* 104.0* 105.8*  PLT 155 118* 153 138* 149*   Cardiac Enzymes: No results for input(s): "CKTOTAL", "CKMB", "CKMBINDEX", "TROPONINI" in the last 168 hours. BNP (last 3 results) No results for input(s): "PROBNP" in the last 8760 hours. CBG: Recent Labs  Lab 06/29/23 1114 06/29/23 1625 06/29/23 2030 06/30/23 0756 06/30/23 1157  GLUCAP 107* 154* 95 82 131*   D-Dimer: No results for input(s): "DDIMER" in the last 72 hours. Hgb A1c: No results for input(s): "HGBA1C" in the last 72 hours. Lipid Profile: No results for input(s): "CHOL", "HDL", "LDLCALC", "TRIG", "CHOLHDL", "LDLDIRECT" in the last 72 hours. Thyroid function studies: No results for input(s): "TSH", "T4TOTAL", "T3FREE", "THYROIDAB" in the last 72 hours.  Invalid input(s): "FREET3" Anemia work up: No results for input(s): "VITAMINB12", "FOLATE", "FERRITIN", "TIBC", "IRON", "RETICCTPCT" in the last 72 hours. Sepsis Labs: Recent Labs  Lab 06/25/23 0808 06/26/23 0425 06/27/23 0528 06/27/23 0944 06/28/23 0756  WBC 9.9 11.9* 10.5  --  10.1  LATICACIDVEN  --   --   --  0.7  --     Microbiology No results found for this or any previous visit (from the past 240 hour(s)).  Procedures and diagnostic  studies:  No results found.       LOS: 23 days   Pennie Banter, DO  [facility]   Pager on  [email or web address]. If 7PM-7AM, please contact night-coverage at [email or web address]  06/30/2023, 1:41 PM

## 2023-06-30 NOTE — Discharge Planning (Signed)
Reached out to [facility] to inform of patient's potential discharge tomorrow. Clinic confirmed they can accept patient back on Thursday 8/1 and resume same schedule, TTS 10:54am arrival with a 11:10am start time.  Also, informed clinic of patient's change in dry weight, 118kg.  Dimas Chyle Dialysis Coordinator II  [facility] Cell: [phone] eFax: [phone] [email or web address]

## 2023-06-30 NOTE — Progress Notes (Signed)
Physical Therapy Treatment Patient Details Name: Jacqueline [name] MRN: [id number] DOB: 02/16/1946 Today's Date: 06/30/2023   History of Present Illness 77 y.o female with significant PMHx significant for HTN, T2DM, ESRD on HD TTS, Chronic debility, CAD s/p stent, PAF, OSA, PAD, Left knee arthritis, Chronic lower back pain, GERD, Obesity, HLD who presented to the ED from Dialysis with generalized weakness x 4 days.    PT Comments  Patient is agreeable to PT. Patient required assistance for bed mobility and transfers. She was able to stand with assistance with bed height elevated. Standing tolerance limited to around 30 seconds. Unable to take steps today due to fatigue with minimal activity. Recommend to continue PT to maximize independence and facilitate return to prior level of function.    If plan is discharge home, recommend the following: A lot of help with walking and/or transfers;A lot of help with bathing/dressing/bathroom;Assist for transportation;Help with stairs or ramp for entrance   Can travel by private vehicle     No  Equipment Recommendations  None recommended by PT    Recommendations for Other Services       Precautions / Restrictions Precautions Precautions: Fall Restrictions Weight Bearing Restrictions: No     Mobility  Bed Mobility Overal bed mobility: Needs Assistance Bed Mobility: Supine to Sit, Sit to Supine     Supine to sit: Min assist, HOB elevated Sit to supine: Mod assist   General bed mobility comments: increased assistance required with return back to bed. verbal cues for technique.    Transfers Overall transfer level: Needs assistance Equipment used: Rolling walker (2 wheels) Transfers: Sit to/from Stand Sit to Stand: Mod assist, From elevated surface           General transfer comment: with first standing attempt, patient unable to stand. with elevating bed height and cues for technique,  patient was able to stand with moderate  assistance. physical assistance needed for lifting and lowering    Ambulation/Gait               General Gait Details: unable to due to generalized weakness and fatigue with minimal standing activity   Stairs             Wheelchair Mobility     Tilt Bed    Modified Rankin (Stroke Patients Only)       Balance Overall balance assessment: Needs assistance Sitting-balance support: Feet supported Sitting balance-Leahy Scale: Fair Sitting balance - Comments: patient occasionally leaning over on to the walker for support in sitting for comfort   Standing balance support: Bilateral upper extremity supported Standing balance-Leahy Scale: Fair Standing balance comment: standing tolerance limited during this session (no more than 30 seconds)                            Cognition Arousal/Alertness: Awake/alert Behavior During Therapy: WFL for tasks assessed/performed Overall Cognitive Status: Within Functional Limits for tasks assessed                                          Exercises      General Comments General comments (skin integrity, edema, etc.): mild dizziness is reported with sitting upright. patient also noted to have intermittent jerking movement of upper body, more pronounced with movement      Pertinent Vitals/Pain Pain Assessment Pain Assessment: No/denies pain  Home Living                          Prior Function            PT Goals (current goals can now be found in the care plan section) Acute Rehab PT Goals Patient Stated Goal: improve mobility/strength PT Goal Formulation: With patient Time For Goal Achievement: 07/11/23 Potential to Achieve Goals: Good Progress towards PT goals: Progressing toward goals    Frequency    Min 1X/week      PT Plan Current plan remains appropriate    Co-evaluation              AM-PAC PT "6 Clicks" Mobility   Outcome Measure  Help needed turning  from your back to your side while in a flat bed without using bedrails?: A Lot Help needed moving from lying on your back to sitting on the side of a flat bed without using bedrails?: A Lot Help needed moving to and from a bed to a chair (including a wheelchair)?: A Little Help needed standing up from a chair using your arms (e.g., wheelchair or bedside chair)?: A Little Help needed to walk in hospital room?: A Lot Help needed climbing 3-5 steps with a railing? : Total 6 Click Score: 13    End of Session Equipment Utilized During Treatment: Oxygen Activity Tolerance: Patient limited by fatigue Patient left: in bed;with call bell/phone within reach Nurse Communication: Mobility status PT Visit Diagnosis: Muscle weakness (generalized) (M62.81);Difficulty in walking, not elsewhere classified (R26.2);Unsteadiness on feet (R26.81)     Time: 8413-2440 PT Time Calculation (min) (ACUTE ONLY): 39 min  Charges:    $Therapeutic Activity: 38-52 mins PT General Charges $$ ACUTE PT VISIT: 1 Visit                     Donna Bernard, PT, MPT    Ina Homes 06/30/2023, 12:09 PM

## 2023-06-30 NOTE — Progress Notes (Signed)
[facility]  ROUNDING NOTE   Subjective:   Patient seen resting in bed Per therapy, patient was able to stand at bedside  Alert and oriented States she feels better today, than yesterday  Objective:  Vital signs in last 24 hours:  Temp:  [98 F (36.7 C)-99.2 F (37.3 C)] 98.7 F (37.1 C) (07/29 0400) Pulse Rate:  [38-120] 63 (07/29 0900) Resp:  [16-31] 17 (07/29 0900) BP: (82-114)/(41-59) 93/53 (07/29 0900) SpO2:  [94 %-100 %] 95 % (07/29 0900) Weight:  [118.6 kg] 118.6 kg (07/29 0500)  Weight change: -2 kg Filed Weights   06/28/23 1129 06/29/23 0500 06/30/23 0500  Weight: 120.1 kg 119.4 kg 118.6 kg    Intake/Output: I/O last 3 completed shifts: In: 1860 [P.O.:1860] Out: 0    Intake/Output this shift:  No intake/output data recorded.  Physical Exam: General: NAD, laying in bed  Head: Normocephalic, atraumatic. Moist oral mucosal membranes  Eyes: Anicteric  Lungs:  Clear to auscultation, normal effort  Heart: Regular rate and rhythm  Abdomen:  Soft, nontender, edema  Extremities: no peripheral edema.  Neurologic: Alert and able to answer questions appropriately  Skin: No lesions  Access: Lt AVF    Basic Metabolic Panel: Recent Labs  Lab 06/24/23 0805 06/25/23 0808 06/26/23 0425 06/28/23 0756 06/30/23 0325  NA 135 135 138 137 141  K 4.9 4.5 4.8 4.4 4.9  CL 97* 100 100 99 101  CO2 25 23 24 23 27   GLUCOSE 127* 131* 102* 125* 108*  BUN 86* 62* 73* 73* 64*  CREATININE 7.56* 6.63* 7.67* 7.66* 7.37*  CALCIUM 8.2* 8.0* 8.0* 8.0* 8.3*  PHOS 5.3* 4.0 5.6* 6.0* 5.5*    Liver Function Tests: Recent Labs  Lab 06/24/23 0805 06/25/23 0808 06/26/23 0425 06/28/23 0756 06/30/23 0325  AST  --  28  --   --   --   ALT  --  25  --   --   --   ALKPHOS  --  46  --   --   --   BILITOT  --  0.5  --   --   --   PROT  --  6.7  --   --   --   ALBUMIN 3.1* 3.2*  3.1* 3.3* 3.3* 3.3*   No results for input(s): "LIPASE", "AMYLASE" in the last 168  hours.  No results for input(s): "AMMONIA" in the last 168 hours.  CBC: Recent Labs  Lab 06/24/23 0805 06/25/23 0808 06/26/23 0425 06/27/23 0528 06/28/23 0756  WBC 10.3 9.9 11.9* 10.5 10.1  HGB 9.6* 10.1* 10.5* 10.3* 10.5*  HCT 31.4* 33.7* 34.4* 34.1* 34.5*  MCV 103.0* 108.4* 104.6* 104.0* 105.8*  PLT 155 118* 153 138* 149*    Cardiac Enzymes: No results for input(s): "CKTOTAL", "CKMB", "CKMBINDEX", "TROPONINI" in the last 168 hours.  BNP: Invalid input(s): "POCBNP"  CBG: Recent Labs  Lab 06/29/23 0727 06/29/23 1114 06/29/23 1625 06/29/23 2030 06/30/23 0756  GLUCAP 93 107* 154* 95 82    Microbiology: Results for orders placed or performed during the hospital encounter of 06/07/23  Blood culture (routine x 2)     Status: None   Collection Time: 06/07/23 12:54 PM   Specimen: BLOOD  Result Value Ref Range Status   Specimen Description BLOOD RIGHT ANTECUBITAL  Final   Special Requests   Final    BOTTLES DRAWN AEROBIC AND ANAEROBIC Blood Culture adequate volume   Culture   Final    NO GROWTH 5 DAYS  Performed at [facility], [address] [place], [place] [address]    Report Status 06/12/2023 FINAL  Final  Blood culture (routine x 2)     Status: None   Collection Time: 06/07/23 12:54 PM   Specimen: BLOOD  Result Value Ref Range Status   Specimen Description BLOOD BLOOD RIGHT FOREARM  Final   Special Requests   Final    BOTTLES DRAWN AEROBIC AND ANAEROBIC Blood Culture adequate volume   Culture   Final    NO GROWTH 5 DAYS Performed at [facility], [address], [place], [place] [address]    Report Status 06/12/2023 FINAL  Final  MRSA Next Gen by PCR, Nasal     Status: None   Collection Time: 06/07/23 10:18 PM   Specimen: Nasal Mucosa; Nasal Swab  Result Value Ref Range Status   MRSA by PCR Next Gen NOT DETECTED NOT DETECTED Final    Comment: (NOTE) The GeneXpert MRSA Assay (FDA approved for NASAL specimens only), is one component  of a comprehensive MRSA colonization surveillance program. It is not intended to diagnose MRSA infection nor to guide or monitor treatment for MRSA infections. Test performance is not FDA approved in patients less than 64 years old. Performed at [facility], [address] [place], [place] [address]     Coagulation Studies: No results for input(s): "LABPROT", "INR" in the last 72 hours.  Urinalysis: No results for input(s): "COLORURINE", "LABSPEC", "PHURINE", "GLUCOSEU", "HGBUR", "BILIRUBINUR", "KETONESUR", "PROTEINUR", "UROBILINOGEN", "NITRITE", "LEUKOCYTESUR" in the last 72 hours.  Invalid input(s): "APPERANCEUR"    Imaging: No results found.   Medications:    sodium chloride Stopped (06/10/23 1900)   sodium chloride     anticoagulant sodium citrate      apixaban  5 mg Oral BID   droxidopa  200 mg Oral TID WC   epoetin (EPOGEN/PROCRIT) injection  4,000 Units Intravenous Q T,Th,Sa-HD   ezetimibe  10 mg Oral Daily   feeding supplement (NEPRO CARB STEADY)  237 mL Oral TID BM   fludrocortisone  0.2 mg Oral BID   insulin aspart  0-20 Units Subcutaneous TID WC   insulin aspart  0-5 Units Subcutaneous QHS   insulin glargine-yfgn  5 Units Subcutaneous Daily   loratadine  10 mg Oral Daily   midodrine  10 mg Oral TID WC   multivitamin  1 tablet Oral QHS   polyethylene glycol  17 g Oral BID   pregabalin  100 mg Oral QHS   pregabalin  50 mg Oral BID   rosuvastatin  10 mg Oral Daily   senna-docusate  1 tablet Oral BID   sodium chloride flush  3 mL Intravenous Q12H   sodium chloride, acetaminophen, alteplase, alum & mag hydroxide-simeth, anticoagulant sodium citrate, bisacodyl, docusate sodium, guaiFENesin-dextromethorphan, heparin, HYDROcodone-acetaminophen, lidocaine (PF), lidocaine-prilocaine, morphine injection, nitroGLYCERIN, ondansetron (ZOFRAN) IV, mouth rinse, pentafluoroprop-tetrafluoroeth, polyvinyl alcohol, sodium chloride flush  Assessment/ Plan:  [name] is a 77 y.o.  female  with a PMHx of ESRD on HD TTS at [facility], diabetes mellitus type 2, chronic hypotension, peripheral arterial disease, obstructive sleep apnea, GERD, obesity, hyperlipidemia, anemia chronic kidney disease, secondary hyperparathyroidism, who was admitted to [facility] on 06/07/2023 for evaluation of generalized weakness.   Found to have severe hypotension, pulmonary hypertension, atrial fibrillation and tricuspid valve regurgitation all contributing to hypotension.  TTS at [facility],  Adjusted EDW 118kg  1  ESRD: on hemodialysis  -Continue TTS schedule -  Will plan for dialysis tomorrow with patient seated in chair - Renal navigator has notified outpatient clinic of potential discharge and EDW. Will also recommend decreased BFR and extended treatment time - Please see note from Dimas Chyle   2.  Chronic hypotension with severe pulmonary hypertension.  Systolic blood pressure usually in the 80s as an outpatient.  Weaned off pressors.  Likely due to severe pulmonary hypertension, with moderate to severe tricuspid regurgitation.  Was evaluated by cardiologist on June 09, 2023.  Not a candidate for aggressive surgical treatments.  Arterial line blood pressure sugars were in the 120s to 140s noted around middle of July. - midodrine 10 mg 3 times daily . Higher dose caused symptomatic bradycardia on dialysis treatment.  - fludrocortisone - droxidopa - Heart rate 30s to 50s during treatment. Palliative care following and patient would like to continue all treatments at this time.  3. Anemia of chronic kidney disease Lab Results  Component Value Date   HGB 10.5 (L) 06/28/2023  Currently on Epogen 4000 units with dialysis treatments. Hemoglobin at goal Continue Mircera as an outpatient.  4. Diabetes mellitus type II with chronic kidney disease/renal manifestations: noninsulin dependent.  Hemoglobin A1c is 5.8 on 06/07/23.   - Glucose well  controlled -Primary team to manage sliding scale insulin.  5. Secondary Hyperparathyroidism:    Lab Results  Component Value Date   PTH 388.6 (H) 01/17/2014   CALCIUM 8.3 (L) 06/30/2023   CAION 0.92 (L) 06/11/2023   PHOS 5.5 (H) 06/30/2023   -Calcium and phosphorus improved - Holding Auryxia and Sensipar     LOS: 23 Ivon Oelkers 7/29/202411:17 AM

## 2023-07-01 ENCOUNTER — Encounter: Payer: Self-pay | Admitting: Internal Medicine

## 2023-07-01 DIAGNOSIS — R001 Bradycardia, unspecified: Secondary | ICD-10-CM | POA: Diagnosis not present

## 2023-07-01 LAB — CBC
HCT: 35.5 % — ABNORMAL LOW (ref 36.0–46.0)
Hemoglobin: 10.8 g/dL — ABNORMAL LOW (ref 12.0–15.0)
MCH: 32 pg (ref 26.0–34.0)
MCHC: 30.4 g/dL (ref 30.0–36.0)
MCV: 105.3 fL — ABNORMAL HIGH (ref 80.0–100.0)
Platelets: 136 10*3/uL — ABNORMAL LOW (ref 150–400)
RBC: 3.37 MIL/uL — ABNORMAL LOW (ref 3.87–5.11)
RDW: 18.1 % — ABNORMAL HIGH (ref 11.5–15.5)
WBC: 9.4 10*3/uL (ref 4.0–10.5)
nRBC: 0 % (ref 0.0–0.2)

## 2023-07-01 LAB — GLUCOSE, CAPILLARY
Glucose-Capillary: 85 mg/dL (ref 70–99)
Glucose-Capillary: 71 mg/dL (ref 70–99)
Glucose-Capillary: 121 mg/dL — ABNORMAL HIGH (ref 70–99)

## 2023-07-01 MED ORDER — ALUM & MAG HYDROXIDE-SIMETH 200-200-20 MG/5ML PO SUSP: 15.0000 mL | ORAL | Status: DC | PRN

## 2023-07-01 MED ORDER — NITROGLYCERIN 0.4 MG SL SUBL: 0.4000 mg | SUBLINGUAL_TABLET | SUBLINGUAL | Status: AC | PRN

## 2023-07-01 MED ORDER — FLUDROCORTISONE ACETATE 0.1 MG PO TABS: 0.2000 mg | ORAL_TABLET | Freq: Two times a day (BID) | ORAL | Status: AC

## 2023-07-01 MED ORDER — APIXABAN 5 MG PO TABS: 5.0000 mg | ORAL_TABLET | Freq: Two times a day (BID) | ORAL | Status: DC

## 2023-07-01 MED ORDER — POLYVINYL ALCOHOL 1.4 % OP SOLN: 1.0000 [drp] | OPHTHALMIC | Status: DC | PRN

## 2023-07-01 MED ORDER — GUAIFENESIN-DM 100-10 MG/5ML PO SYRP: 15.0000 mL | ORAL_SOLUTION | ORAL | Status: DC | PRN

## 2023-07-01 MED ORDER — DROXIDOPA 200 MG PO CAPS: 200.0000 mg | ORAL_CAPSULE | Freq: Three times a day (TID) | ORAL | Status: DC

## 2023-07-01 MED ORDER — PANTOPRAZOLE SODIUM 40 MG PO TBEC: 40.0000 mg | DELAYED_RELEASE_TABLET | Freq: Every day | ORAL | Status: DC

## 2023-07-01 MED ORDER — INSULIN ASPART 100 UNIT/ML IJ SOLN: 0.0000 [IU] | Freq: Every day | INTRAMUSCULAR | Status: DC

## 2023-07-01 MED ORDER — INSULIN ASPART 100 UNIT/ML IJ SOLN: 0.0000 [IU] | Freq: Three times a day (TID) | INTRAMUSCULAR | Status: AC

## 2023-07-01 MED ORDER — EPOETIN ALFA 4000 UNIT/ML IJ SOLN
INTRAMUSCULAR | Status: AC
  Filled 2023-07-01: qty 1

## 2023-07-01 MED ORDER — HYDROCODONE-ACETAMINOPHEN 5-325 MG PO TABS: 1.0000 | ORAL_TABLET | Freq: Four times a day (QID) | ORAL | 0 refills | Status: DC | PRN

## 2023-07-01 MED ORDER — HYDROCODONE-ACETAMINOPHEN 5-325 MG PO TABS: 1.0000 | ORAL_TABLET | Freq: Four times a day (QID) | ORAL | 0 refills | Status: AC | PRN

## 2023-07-01 MED ORDER — INSULIN GLARGINE-YFGN 100 UNIT/ML ~~LOC~~ SOLN: 5.0000 [IU] | Freq: Every day | SUBCUTANEOUS | Status: DC

## 2023-07-01 MED ORDER — ACETAMINOPHEN 325 MG PO TABS: 650.0000 mg | ORAL_TABLET | Freq: Four times a day (QID) | ORAL | Status: DC | PRN

## 2023-07-01 NOTE — Progress Notes (Addendum)
Report called to [facility]. Status gone over with RN. Case manager Dasha to call ems for transprot

## 2023-07-01 NOTE — Progress Notes (Signed)
[facility]  ROUNDING NOTE   Subjective:   Patient seen and evaluated during dialysis   HEMODIALYSIS FLOWSHEET:  Blood Flow Rate (mL/min): 300 mL/min Arterial Pressure (mmHg): -90 mmHg Venous Pressure (mmHg): 160 mmHg TMP (mmHg): 0 mmHg Ultrafiltration Rate (mL/min): 686 mL/min Dialysate Flow Rate (mL/min): 300 ml/min Dialysis Fluid Bolus: Normal Saline Bolus Amount (mL): 100 mL  Tolerating treatment well Denies pain or discomfort Was able to transfer to recliner with 2-person assist prior to dialysis   Objective:  Vital signs in last 24 hours:  Temp:  [97.6 F (36.4 C)-98.9 F (37.2 C)] 97.6 F (36.4 C) (07/30 1207) Pulse Rate:  [52-87] 68 (07/30 1207) Resp:  [13-24] 16 (07/30 1207) BP: (68-109)/(40-66) 95/45 (07/30 1207) SpO2:  [77 %-100 %] 100 % (07/30 1207) Weight:  [119.7 kg-121.7 kg] 121.7 kg (07/30 0807)  Weight change: 1.1 kg Filed Weights   06/30/23 0500 07/01/23 0500 07/01/23 0807  Weight: 118.6 kg 119.7 kg 121.7 kg    Intake/Output: I/O last 3 completed shifts: In: 510 [P.O.:510] Out: 0    Intake/Output this shift:  Total I/O In: -  Out: 1500 [Other:1500]  Physical Exam: General: NAD, seated in chair  Head: Normocephalic, atraumatic. Moist oral mucosal membranes  Eyes: Anicteric  Lungs:  Clear to auscultation, normal effort  Heart: Regular rate and rhythm  Abdomen:  Soft, nontender, edema  Extremities: no peripheral edema.  Neurologic: Alert and able to answer questions appropriately  Skin: No lesions  Access: Lt AVF    Basic Metabolic Panel: Recent Labs  Lab 06/25/23 0808 06/26/23 0425 06/28/23 0756 06/30/23 0325 07/01/23 0828  NA 135 138 137 141 139  K 4.5 4.8 4.4 4.9 4.9  CL 100 100 99 101 100  CO2 23 24 23 27 22   GLUCOSE 131* 102* 125* 108* 106*  BUN 62* 73* 73* 64* 89*  CREATININE 6.63* 7.67* 7.66* 7.37* 9.13*  CALCIUM 8.0* 8.0* 8.0* 8.3* 8.2*  PHOS 4.0 5.6* 6.0* 5.5* 7.0*    Liver Function Tests: Recent  Labs  Lab 06/25/23 0808 06/26/23 0425 06/28/23 0756 06/30/23 0325 07/01/23 0828  AST 28  --   --   --   --   ALT 25  --   --   --   --   ALKPHOS 46  --   --   --   --   BILITOT 0.5  --   --   --   --   PROT 6.7  --   --   --   --   ALBUMIN 3.2*  3.1* 3.3* 3.3* 3.3* 3.4*   No results for input(s): "LIPASE", "AMYLASE" in the last 168 hours.  No results for input(s): "AMMONIA" in the last 168 hours.  CBC: Recent Labs  Lab 06/25/23 0808 06/26/23 0425 06/27/23 0528 06/28/23 0756 07/01/23 0828  WBC 9.9 11.9* 10.5 10.1 9.4  HGB 10.1* 10.5* 10.3* 10.5* 10.8*  HCT 33.7* 34.4* 34.1* 34.5* 35.5*  MCV 108.4* 104.6* 104.0* 105.8* 105.3*  PLT 118* 153 138* 149* 136*    Cardiac Enzymes: No results for input(s): "CKTOTAL", "CKMB", "CKMBINDEX", "TROPONINI" in the last 168 hours.  BNP: Invalid input(s): "POCBNP"  CBG: Recent Labs  Lab 06/30/23 0756 06/30/23 1157 06/30/23 1607 06/30/23 2110 07/01/23 0741  GLUCAP 82 131* 95 149* 85    Microbiology: Results for orders placed or performed during the hospital encounter of 06/07/23  Blood culture (routine x 2)     Status: None   Collection Time: 06/07/23  12:54 PM   Specimen: BLOOD  Result Value Ref Range Status   Specimen Description BLOOD RIGHT ANTECUBITAL  Final   Special Requests   Final    BOTTLES DRAWN AEROBIC AND ANAEROBIC Blood Culture adequate volume   Culture   Final    NO GROWTH 5 DAYS Performed at [facility], [address] [place], [place] [address]    Report Status 06/12/2023 FINAL  Final  Blood culture (routine x 2)     Status: None   Collection Time: 06/07/23 12:54 PM   Specimen: BLOOD  Result Value Ref Range Status   Specimen Description BLOOD BLOOD RIGHT FOREARM  Final   Special Requests   Final    BOTTLES DRAWN AEROBIC AND ANAEROBIC Blood Culture adequate volume   Culture   Final    NO GROWTH 5 DAYS Performed at [facility], [address] [place], [place] [address]     Report Status 06/12/2023 FINAL  Final  MRSA Next Gen by PCR, Nasal     Status: None   Collection Time: 06/07/23 10:18 PM   Specimen: Nasal Mucosa; Nasal Swab  Result Value Ref Range Status   MRSA by PCR Next Gen NOT DETECTED NOT DETECTED Final    Comment: (NOTE) The GeneXpert MRSA Assay (FDA approved for NASAL specimens only), is one component of a comprehensive MRSA colonization surveillance program. It is not intended to diagnose MRSA infection nor to guide or monitor treatment for MRSA infections. Test performance is not FDA approved in patients less than 107 years old. Performed at [facility], [address] [place], [place] [address]     Coagulation Studies: No results for input(s): "LABPROT", "INR" in the last 72 hours.  Urinalysis: No results for input(s): "COLORURINE", "LABSPEC", "PHURINE", "GLUCOSEU", "HGBUR", "BILIRUBINUR", "KETONESUR", "PROTEINUR", "UROBILINOGEN", "NITRITE", "LEUKOCYTESUR" in the last 72 hours.  Invalid input(s): "APPERANCEUR"    Imaging: No results found.   Medications:    sodium chloride Stopped (06/10/23 1900)   sodium chloride     anticoagulant sodium citrate      apixaban  5 mg Oral BID   droxidopa  200 mg Oral TID WC   epoetin (EPOGEN/PROCRIT) injection  4,000 Units Intravenous Q T,Th,Sa-HD   ezetimibe  10 mg Oral Daily   feeding supplement (NEPRO CARB STEADY)  237 mL Oral TID BM   fludrocortisone  0.2 mg Oral BID   insulin aspart  0-20 Units Subcutaneous TID WC   insulin aspart  0-5 Units Subcutaneous QHS   insulin glargine-yfgn  5 Units Subcutaneous Daily   loratadine  10 mg Oral Daily   midodrine  10 mg Oral TID WC   multivitamin  1 tablet Oral QHS   pantoprazole  40 mg Oral Daily   polyethylene glycol  17 g Oral BID   pregabalin  100 mg Oral QHS   pregabalin  50 mg Oral BID   rosuvastatin  10 mg Oral Daily   senna-docusate  1 tablet Oral BID   sodium chloride flush  3 mL Intravenous Q12H   sodium chloride,  acetaminophen, alteplase, alum & mag hydroxide-simeth, anticoagulant sodium citrate, bisacodyl, docusate sodium, guaiFENesin-dextromethorphan, heparin, HYDROcodone-acetaminophen, lidocaine (PF), lidocaine-prilocaine, morphine injection, nitroGLYCERIN, ondansetron (ZOFRAN) IV, mouth rinse, pentafluoroprop-tetrafluoroeth, polyvinyl alcohol, sodium chloride flush  Assessment/ Plan:  Jacqueline [name] is a 77 y.o.  female  with a PMHx of ESRD on HD TTS at [facility], diabetes mellitus type 2, chronic hypotension, peripheral arterial disease, obstructive sleep apnea, GERD,  obesity, hyperlipidemia, anemia chronic kidney disease, secondary hyperparathyroidism, who was admitted to [facility] on 06/07/2023 for evaluation of generalized weakness.   Found to have severe hypotension, pulmonary hypertension, atrial fibrillation and tricuspid valve regurgitation all contributing to hypotension.  TTS at [facility],  Adjusted EDW 118kg  1  ESRD: on hemodialysis  -Continue TTS schedule -  Tolerating treatment well, UF 1.5L as tolerated - Next treatment scheduled for Thursday - Renal navigator has notified outpatient clinic of potential discharge and EDW. Will also recommend decreased BFR and extended treatment time    2.  Chronic hypotension with severe pulmonary hypertension.  Systolic blood pressure usually in the 80s as an outpatient.  Weaned off pressors.  Likely due to severe pulmonary hypertension, with moderate to severe tricuspid regurgitation.  Was evaluated by cardiologist on June 09, 2023.  Not a candidate for aggressive surgical treatments.  Arterial line blood pressure sugars were in the 120s to 140s noted around middle of July. - midodrine 10 mg 3 times daily . Higher dose caused symptomatic bradycardia on dialysis treatment.  - fludrocortisone - droxidopa - Heart rate 30s to 50s during treatment. Mentation intact and stable  3. Anemia of chronic kidney disease Lab Results   Component Value Date   HGB 10.8 (L) 07/01/2023  Currently on Epogen 4000 units with dialysis treatments. Hemoglobin stable Continue Mircera as an outpatient.  4. Diabetes mellitus type II with chronic kidney disease/renal manifestations: noninsulin dependent.  Hemoglobin A1c is 5.8 on 06/07/23.  -Primary team to manage sliding scale insulin.  5. Secondary Hyperparathyroidism:    Lab Results  Component Value Date   PTH 388.6 (H) 01/17/2014   CALCIUM 8.2 (L) 07/01/2023   CAION 0.92 (L) 06/11/2023   PHOS 7.0 (H) 07/01/2023   -Will continue to monitor bone minerals during this admission.  - Holding Auryxia and Sensipar     LOS: 24 Doriann Zuch 7/30/202412:22 PM

## 2023-07-01 NOTE — Progress Notes (Signed)
Remains off the unit, in dialysis at this time

## 2023-07-01 NOTE — Progress Notes (Signed)
Patient transported off floor via stretcher by ems, on 02 @ 2l. No distress noted. Discharge packet given to ems. Son dexter was called and made aware of discharge

## 2023-07-01 NOTE — Plan of Care (Signed)
  Problem: Education: Goal: Knowledge of General Education information will improve Description: Including pain rating scale, medication(s)/side effects and non-pharmacologic comfort measures Outcome: Progressing   Problem: Health Behavior/Discharge Planning: Goal: Ability to manage health-related needs will improve Outcome: Progressing   Problem: Clinical Measurements: Goal: Ability to maintain clinical measurements within normal limits will improve Outcome: Progressing Goal: Respiratory complications will improve Outcome: Progressing Goal: Cardiovascular complication will be avoided Outcome: Progressing   Problem: Elimination: Goal: Will not experience complications related to bowel motility Outcome: Progressing Goal: Will not experience complications related to urinary retention Outcome: Progressing   Problem: Pain Managment: Goal: General experience of comfort will improve Outcome: Progressing   Problem: Safety: Goal: Ability to remain free from injury will improve Outcome: Progressing

## 2023-07-01 NOTE — Progress Notes (Signed)
Hemodialysis note  Received patient in bed to unit. Alert and oriented.  Informed consent signed and in chart.  Treatment initiated: 0831 Treatment completed: 1207  Patient tolerated well. Transported back to room, alert without acute distress.  Report given to patient's RN.   Access used: LUA AVF Access issues: none  Total UF removed: 1.5 L Medication(s) given:  Florinef 0.2 mg PO, Epogen 4000 units IV  Post HD weight: 119.1 kg   Wolfgang [facility]

## 2023-07-01 NOTE — TOC Transition Note (Signed)
Transition of Care [facility]) - CM/SW Discharge Note   Patient Details  Name: Jacqueline [name] MRN: [id number] Date of Birth: 06/16/1946  Transition of Care [facility]) CM/SW Contact:  Kreg Shropshire, RN Phone Number: 07/01/2023, 4:28 PM   Clinical Narrative:     Patient will DC to: [facility] Anticipated DC date: 07/01/23 Family notified: Son Transport by: [place] Non EMS transport  Per MD patient ready for DC to . RN, patient, patient's family, and facility notified of DC. Discharge Summary sent to facility. RN given number for report. DC packet on chart. Ambulance transport requested for patient.  TOC signing off.   Final next level of care: Skilled Nursing Facility Barriers to Discharge: Barriers Resolved   Patient Goals and CMS Choice CMS Medicare.gov Compare Post Acute Care list provided to:: Patient Represenative (must comment) Choice offered to / list presented to : Adult Children  Discharge Placement                  Patient to be transferred to facility by: [facility] Non emergent EMS Name of family member notified: Son Patient and family notified of of transfer: 07/01/23  Discharge Plan and Services Additional resources added to the After Visit Summary for                                       Social Determinants of Health (SDOH) Interventions SDOH Screenings   Food Insecurity: No Food Insecurity (06/07/2023)  Housing: Low Risk  (06/07/2023)  Transportation Needs: No Transportation Needs (06/07/2023)  Utilities: Not At Risk (06/07/2023)  Depression (PHQ2-9): Low Risk  (06/27/2021)  Financial Resource Strain: Medium Risk (06/27/2021)  Physical Activity: Inactive (06/27/2021)  Stress: No Stress Concern Present (06/27/2021)  Tobacco Use: Medium Risk (06/07/2023)     Readmission Risk Interventions    05/22/2022   12:26 PM  Readmission Risk Prevention Plan  Transportation Screening Complete  PCP or Specialist Appt within 3-5 Days Complete  HRI or Home Care  Consult Complete  Social Work Consult for Recovery Care Planning/Counseling Complete  Palliative Care Screening Complete  Medication Review Oceanographer) Complete

## 2023-07-01 NOTE — Discharge Summary (Addendum)
Physician Discharge Summary   Patient: Jacqueline [name] MRN: [id number] DOB: February 22, 1946  Admit date:     06/07/2023  Discharge date: 07/01/23  Discharge Physician: Pennie Banter   PCP: Macky Lower, MD   Recommendations at discharge:   Follow up with Nephrology and attend outpatient dialysis as scheduled Follow up with Primary Care BLOOD PRESSURE -- monitor BP 2-3 times daily.   Acceptable BP's are 70's/50's or higher (and MAP above 50). If BP is low but patient's mental status is okay (awake, alert, talkative, not dizzy or lightheaded), it is acceptable.  See below for further details re. BP. Repeat CBC, CMP, Mg at follow up  Discharge Diagnoses: Active Problems:   Hypotension   Permanent atrial fibrillation (HCC)   End stage renal disease (HCC)   Bradycardia   Morbid obesity (HCC)   Severe pulmonary hypertension (HCC)   Goals of care, counseling/discussion  Principal Problem (Resolved):   Symptomatic bradycardia  Hospital Course:  Jacqueline [name] is a 77 y.o. female with PMHx significant for HTN, chronic hypotension, type II DM, ESRD on HD TTS, Chronic debility, CAD s/p stent, PAF, OSA, PAD, Left knee arthritis, Chronic lower back pain, GERD, Obesity, HLD who presented to the ED from the dialysis center with generalized weakness and hypotension with BP of 72/42.  Reportedly, patient had nausea, vomiting, diarrhea and left lower quadrant abdominal pain the night prior to admission.   ED Course:  Initial vital signs showed HR of 50 beats/minute, BP 81/51 mm Hg, the RR 16 breaths/minute, and the oxygen saturation 91% on 2L and a temperature of 97.70F (36.6C).   Pertinent Labs/Diagnostics.   EKG:?Afib vs junctional bradycardia rate 40bmp. Incomplete RBB,   CXR, CT scan obtained and show no signs of infection or other abnormality. Patient given, midodrine, atropine for significant bradycardia and  500 cc of fluids and started on broad-spectrum antibiotics Vanco cefepime and  Flagyl for  suspected sepsis of unknown source. Patient remained hypotensive despite  intervention as above. PCCM consulted for possible pressors.       Significant Hospital Events: Including procedures, antibiotic start and stop dates in addition to other pertinent events   06/07/2023: admit to ICU with bradycardia, started on dopamine 06/08/2023: weaned off dopamine, hypotensive again, dopamine restarted 06/09/2023:  Remains hypotensive requiring Levophed, (pt is vasculopath, doubt accuracy of cuff BP as pt is awake, alert, and asymptomatic).  Attempted Arterial line but unsuccessful, Vascular Surgery consulted to assist with placement. Nephrology consulted, received HD. 06/10/2023: Continues to require Levophed (6 mcg currently), when Levo weaned down does exhibit some Bradycardia in the upper 50's.  Cardiology considering R Heart Cath today. Holding on transfer to [facility] for pacer at this time. 06/11/2023: Levophed weaning down, this AM. LHC/RHC with mild-moderate non-obstructive CAD, patent proximal RCA stent, mild-moderately elevated L heart filling pressures, severely elevated R heart/pulm artery pressures, mildly reduced CO/CI. Episode of chest pressure with HD, resolved with morphine. 06/12/2023: Additional episodes of chest tightness/pressure overnight (0150 and 0554). EKG without changes, trops flat. Hypertensive to 227/80, then with NE off HR dropped to 29.  Droxidopa was eventually stopped. 06/13/2023: No events overnight, no chest pain or hypertension. Remains on Levophed.  Decrease Midodrine to 5 mg TID and restart Droxidopa, increase Florinef and stop Solu-cortef in attempts to wean Levophed off. Obtain CT Chest to rule out Pulmonary pathology for etiology of PH. 06/14/2023: chest pain yesterday. Re-started on levophed over night. CT shows no acute finding of ILD. Droxidopa  re-started yesterday. 06/15/2023: completed dialysis yesterday. Re-started again on levophed.  06/16/23- met with  cardiology to discuss treatment options.  At this time patient remains severely hypotensive on maximal medical management. Her Renal impairment and hypotension limit therapy.  Recommendation for palliative care at this time due to poor prognosis. 06/17/23- patient remains hypotensive with poor prognosis. She is requiring less vasopressor support.  06/18/23- patient is off vasopressors.  Still has overall very poor prognosis.  Recommendation for palliative/hospice.   Interim timeframe since above events - hypotension remains but overall BP's are stable and patient asymptomatic.  Patient on midodrine, Florinef and droxidopa with persistently soft BP's, but has remained stable and with no signs of poor perfusion.   Tolerated dialysis in chair today.   Medically stable for discharge.   Assessment and Plan:  Circulatory shock (suspected cardiogenic shock) - resolved Acute on chronic hypotension:  Dysautonomia Weaned off of IV Levophed drip on 06/16/2024. BP is still low and has been fluctuating - however, pt shows no signs of inadequate perfusion  --- PLAN --- -- Continue midodrine, fludrocortisone and droxidopa. --BP's in 70's/50's, MAP 50+ are acceptable -- peripheral BP's are falsely low.   A-line pressures in ICU were significantly higher and pt does NOT show signs of poor perfusion.  Mentation is normal. --Monitor mental status as sign of adequate perfusion   --Pt should be upright as much as possible for droxidopa to be effective -- OOB to chair --Try compression stockings, thigh-high if pt will tolerate    Persistent atrial fibrillation, bradycardia: Seen by EP cardiologist and she is deemed not to be a candidate for pacemaker at this time.  Not on any rate control medications because of bradycardia. --Continue Eliquis   CAD s/p RCA PCI, severe pulmonary hypertension, moderate to severe TR: S/p right and left heart cath on 06/11/2023 showed patent RCA stent, mid to moderate  nonobstructive CAD, severely elevated right heart and pulmonary artery pressures.   Atypical Chest pain due to GERD, Dyspepsia 7/29 - pt having chest discomfort overnight and this AM, vomiting overnight -- Started Protonix for reflux -- PRN Maalox -- Consider referral to Gastroenterology for EGD to evaluate   ESRD on hemodialysis anemia of CKD:  Nephrology following for dialysis Ultrafiltration has been limited by hypotension Resume usual outpatient dialysis as scheduled   Left leg pain (popliteal fossa) -- pt on Eliquis, unlikely DVT but will evaluate.  Baker's cyst also in differential. --US doppler of LLE negative for DVT or Baker's cyst   Hypokalemia: Improved   Chronic hyponatremia: Asymptomatic.  Monitor BMP   Type II DM: Continue Semglee and NovoLog as needed for hyperglycemia. Hemoglobin A1c was 5.8 on 06/07/2023   Thrombocytopenia: fluctuating platelet count but overall improved.   Other comorbidities include chronic debility, chronic low back pain, left knee arthritis   Morbid obesity - Body mass index is 44.88 kg/m. Complicates overall care and prognosis.  Recommend lifestyle modifications including physical activity and diet for weight loss and overall long-term health.           Consultants: Nephrology, PCCM, Cardiology, EP, Palliative Care  Procedures performed: Dialysis   Disposition: Skilled nursing facility  Diet recommendation:  Discharge Diet Orders (From admission, onward)     Start     Ordered   07/01/23 0000  Diet - Carb modified    1200 cc/day fluid restriction 07/01/23 1604            DISCHARGE MEDICATION: Allergies as of 07/01/2023  Reactions   Sulfa Antibiotics Other (See Comments)   Sulfa Drugs Cross Reactors Other (See Comments)   Doesn't remember         Medication List     STOP taking these medications    acetaminophen 650 MG CR tablet Commonly known as: TYLENOL Replaced by: acetaminophen 325 MG tablet    albuterol 108 (90 Base) MCG/ACT inhaler Commonly known as: VENTOLIN HFA   Dialyvite/Zinc Tabs   Fish Oil 1000 MG Cpdr   omeprazole 20 MG capsule Commonly known as: PRILOSEC   trolamine salicylate 10 % cream Commonly known as: ASPERCREME       TAKE these medications    acetaminophen 325 MG tablet Commonly known as: TYLENOL Take 2 tablets (650 mg total) by mouth every 6 (six) hours as needed for mild pain. Replaces: acetaminophen 650 MG CR tablet   alteplase 2 MG injection Commonly known as: CATHFLO ACTIVASE 2 mg by Intracatheter route once as needed for open catheter.   alum & mag hydroxide-simeth 200-200-20 MG/5ML suspension Commonly known as: MAALOX/MYLANTA Take 15 mLs by mouth every 4 (four) hours as needed for indigestion or heartburn.   anticoagulant sodium citrate 4 GM/100ML Soln 5 mLs by Intracatheter route as needed (in dialysis, If TTP or HIT.).   apixaban 5 MG Tabs tablet Commonly known as: ELIQUIS Take 1 tablet (5 mg total) by mouth 2 (two) times daily.   bisacodyl 5 MG EC tablet Commonly known as: DULCOLAX Take 2 tablets (10 mg total) by mouth daily as needed for moderate constipation. What changed: Another medication with the same name was added. Make sure you understand how and when to take each.   bisacodyl 5 MG EC tablet Commonly known as: DULCOLAX Take 2 tablets (10 mg total) by mouth daily as needed for moderate constipation. What changed: You were already taking a medication with the same name, and this prescription was added. Make sure you understand how and when to take each.   Chlorhexidine Gluconate Cloth 2 % Pads Apply 6 each topically daily.   cinacalcet 30 MG tablet Commonly known as: SENSIPAR Take 30 mg by mouth daily with supper.   docusate sodium 100 MG capsule Commonly known as: COLACE Take 1 capsule (100 mg total) by mouth 2 (two) times daily as needed for mild constipation.   Droxidopa 200 MG Caps Take 200 mg by mouth 3  (three) times daily with meals.   ezetimibe 10 MG tablet Commonly known as: ZETIA Take 1 tablet (10 mg total) by mouth daily.   feeding supplement (NEPRO CARB STEADY) Liqd Take 237 mLs by mouth 3 (three) times daily between meals.   ferric citrate 1 GM 210 MG(Fe) tablet Commonly known as: AURYXIA Take 420 mg by mouth 3 (three) times daily with meals. What changed: Another medication with the same name was added. Make sure you understand how and when to take each.   ferric citrate 1 GM 210 MG(Fe) tablet Commonly known as: AURYXIA Take 2 tablets (420 mg total) by mouth 3 (three) times daily with meals. What changed: You were already taking a medication with the same name, and this prescription was added. Make sure you understand how and when to take each.   fexofenadine 180 MG tablet Commonly known as: ALLEGRA Take 180 mg by mouth daily.   fludrocortisone 0.1 MG tablet Commonly known as: FLORINEF Take 2 tablets (0.2 mg total) by mouth 2 (two) times daily.   guaiFENesin-dextromethorphan 100-10 MG/5ML syrup Commonly known as: ROBITUSSIN DM Take  15 mLs by mouth every 4 (four) hours as needed for cough.   heparin 1000 unit/mL Soln injection 1 mL (1,000 Units total) by Intracatheter route as needed (in dialysis).   HYDROcodone-acetaminophen 5-325 MG tablet Commonly known as: NORCO/VICODIN Take 1-2 tablets by mouth every 6 (six) hours as needed for moderate pain.   insulin aspart 100 UNIT/ML injection Commonly known as: novoLOG Inject 0-20 Units into the skin 3 (three) times daily with meals. For CBG 70-120: 0 units For CBG 121-150: 3 units For CBG 151-200: 4 units For CBG 201-250: 7 units For CBG 251-300: 11 units For CBG 301-350: 15 units For CBG 351-400: 20 units For CBG > 400: 25 units and call your doctor   insulin aspart 100 UNIT/ML injection Commonly known as: novoLOG Inject 0-5 Units into the skin at bedtime.   insulin glargine-yfgn 100 UNIT/ML injection Commonly  known as: SEMGLEE Inject 0.05 mLs (5 Units total) into the skin daily. Start taking on: July 02, 2023   lidocaine (PF) 1 % Soln injection Commonly known as: XYLOCAINE Inject 5 mLs into the skin as needed (topical anesthesia for hemodialysis if GEBAUERS is ineffective.).   lidocaine-prilocaine cream Commonly known as: EMLA Apply 1 Application topically as needed (topical anesthesia for hemodialysis if Gebauers and Lidocaine injection are ineffective.).   midodrine 10 MG tablet Commonly known as: PROAMATINE Take 1 tablet (10 mg total) by mouth 3 (three) times daily with meals. What changed:  how much to take when to take this additional instructions Another medication with the same name was removed. Continue taking this medication, and follow the directions you see here.   multivitamin Tabs tablet Take 1 tablet by mouth at bedtime.   nitroGLYCERIN 0.4 MG SL tablet Commonly known as: NITROSTAT Place 1 tablet (0.4 mg total) under the tongue every 5 (five) minutes as needed for chest pain.   ondansetron 4 MG disintegrating tablet Commonly known as: ZOFRAN-ODT Take 4 mg by mouth every 8 (eight) hours as needed for nausea or vomiting.   ondansetron 4 MG/2ML Soln injection Commonly known as: ZOFRAN Inject 2 mLs (4 mg total) into the vein every 6 (six) hours as needed for vomiting or nausea.   pantoprazole 40 MG tablet Commonly known as: PROTONIX Take 1 tablet (40 mg total) by mouth daily. Start taking on: July 02, 2023   pentafluoroprop-tetrafluoroeth Aero Commonly known as: GEBAUERS Apply 1 Application topically as needed (topical anesthesia for hemodialysis).   polyethylene glycol 17 g packet Commonly known as: MIRALAX / GLYCOLAX Take 17 g by mouth daily as needed for moderate constipation. What changed:  when to take this reasons to take this   polyvinyl alcohol 1.4 % ophthalmic solution Commonly known as: LIQUIFILM TEARS Place 1 drop into both eyes as needed for dry  eyes.   pregabalin 100 MG capsule Commonly known as: LYRICA Take 100 mg by mouth at bedtime.   pregabalin 50 MG capsule Commonly known as: LYRICA Take 50 mg by mouth 2 (two) times daily.   rosuvastatin 10 MG tablet Commonly known as: CRESTOR Take 1 tablet (10 mg total) by mouth daily.   sennosides-docusate sodium 8.6-50 MG tablet Commonly known as: SENOKOT-S Take 1 tablet by mouth 2 (two) times daily.        Discharge Exam: Filed Weights   07/01/23 0500 07/01/23 0807 07/01/23 1207  Weight: 119.7 kg 121.7 kg 119.9 kg   General exam: awake, alert, no acute distress, obese HEENT: moist mucus membranes, hearing grossly normal  Respiratory  system: CTAB diminished bases, no wheezes, rales or rhonchi, normal respiratory effort. Cardiovascular system: normal S1/S2, RRR, no pedal edema.   Gastrointestinal system: soft, NT, ND, no HSM felt, +bowel sounds. Central nervous system: A&O x3. no gross focal neurologic deficits, normal speech Extremities: moves all, no edema, normal tone, LUE fistula noted Skin: dry, intact, normal temperature Psychiatry: normal mood, congruent affect, judgement and insight appear normal   Condition at discharge: stable  The results of significant diagnostics from this hospitalization (including imaging, microbiology, ancillary and laboratory) are listed below for reference.   Imaging Studies: US Venous Img Lower Unilateral Left (DVT)  Result Date: 06/26/2023 CLINICAL DATA:  Left lower extremity pain. EXAM: LEFT LOWER EXTREMITY VENOUS DOPPLER ULTRASOUND TECHNIQUE: Gray-scale sonography with graded compression, as well as color Doppler and duplex ultrasound were performed to evaluate the lower extremity deep venous systems from the level of the common femoral vein and including the common femoral, femoral, profunda femoral, popliteal and calf veins including the posterior tibial, peroneal and gastrocnemius veins when visible. The superficial great  saphenous vein was also interrogated. Spectral Doppler was utilized to evaluate flow at rest and with distal augmentation maneuvers in the common femoral, femoral and popliteal veins. COMPARISON:  None Available. FINDINGS: Contralateral Common Femoral Vein: Respiratory phasicity is normal and symmetric with the symptomatic side. No evidence of thrombus. Normal compressibility. Common Femoral Vein: No evidence of thrombus. Normal compressibility, respiratory phasicity and response to augmentation. Saphenofemoral Junction: No evidence of thrombus. Normal compressibility and flow on color Doppler imaging. Profunda Femoral Vein: No evidence of thrombus. Normal compressibility and flow on color Doppler imaging. Femoral Vein: No evidence of thrombus. Proximal and mid femoral veins are normally patent. The distal thigh segment of the femoral vein was difficult to visualize. Normal compressibility, respiratory phasicity and response to augmentation. Popliteal Vein: No evidence of thrombus. Normal compressibility, respiratory phasicity and response to augmentation. Calf Veins: No evidence of thrombus. Normal compressibility and flow on color Doppler imaging. Superficial Great Saphenous Vein: No evidence of thrombus. Normal compressibility. Venous Reflux:  None. Other Findings: No evidence of superficial thrombophlebitis or abnormal fluid collection. IMPRESSION: No evidence of left lower extremity deep venous thrombosis. Electronically Signed   By: Irish Lack M.D.   On: 06/26/2023 16:35   [place] Dialysis Access  Result Date: 06/14/2023 CLINICAL DATA:  ESRD with indwelling left brachiocephalic AV fistula. Persistent hypotension on dialysis with suspected high-volume flows via the AV fistula. EXAM: ULTRASOUND DIALYSIS ACCESS TECHNIQUE: Limited duplex evaluation of the left brachiocephalic AV fistula was performed including color Doppler evaluation and sampling in portions of the arterial inflow and cephalic venous outflow.  COMPARISON:  None Available. FINDINGS: Brachial artery inflow demonstrates normal arterial waveform and normal patency. Arterial anastomosis is patent. Normally patent flow is noted in a mature cephalic vein without significantly elevated velocities to suggest extremely high flow. Maximal measured velocity in the venous outflow was less than 40 centimeters/second. Flow is not very turbulent. The entire venous outflow was not assessed. IMPRESSION: Patent left brachiocephalic AV fistula with normal arterial inflow and patent assessed cephalic venous outflow. The assess cephalic venous outflow velocities and waveforms do not suggest extremely high flow. Electronically Signed   By: Irish Lack M.D.   On: 06/14/2023 08:38   CT CHEST WO CONTRAST  Result Date: 06/13/2023 CLINICAL DATA:  Shortness of breath. EXAM: CT CHEST WITHOUT CONTRAST TECHNIQUE: Multidetector CT imaging of the chest was performed following the standard protocol without IV contrast. RADIATION DOSE REDUCTION: This  exam was performed according to the departmental dose-optimization program which includes automated exposure control, adjustment of the mA and/or kV according to patient size and/or use of iterative reconstruction technique. COMPARISON:  CTA chest June 20, 23. FINDINGS: Cardiovascular: Cardiomegaly. Coronary artery atherosclerosis. Aortic atherosclerosis. Mediastinum/Nodes: No enlarged mediastinal or axillary lymph nodes. Thyroid gland, trachea, and esophagus demonstrate no significant findings. Lungs/Pleura: Mild dependent atelectasis. Otherwise, clear lungs. No pleural effusions. No pneumothorax. Upper Abdomen: No acute abnormality. Partially imaged exophytic left renal cyst. Left axial via anterior abdominal wall. Musculoskeletal: Multilevel degenerative change.  No acute fracture. IMPRESSION: 1. Mild dependent atelectasis.  Otherwise, clear lungs. 2. Cardiomegaly. 3. Coronary artery and aortic atherosclerosis (ICD10-I70.0).  Electronically Signed   By: Feliberto Harts M.D.   On: 06/13/2023 12:35   CARDIAC CATHETERIZATION  Result Date: 06/11/2023 Conclusions: Mild to moderate, non-obstructive coronary artery disease, as detailed below. Widely patent proximal RCA stent. Mild-moderately elevated left heart filling pressures. Severely elevated right heart and pulmonary artery pressures.  Pulmonary artery pressure does not exceed systemic pressure. Mildly reduced cardiac output/index. Recommendations: Continue secondary prevention of coronary artery disease. Gentle fluid removal with hemodialysis as blood pressure allows. Yvonne Kendall, MD [facility]  ECHOCARDIOGRAM COMPLETE  Result Date: 06/08/2023    ECHOCARDIOGRAM REPORT   Patient Name:   Jacqueline [name] Date of Exam: 06/08/2023 Medical Rec #:  [id number]         Height:       64.0 in Accession #:    [id number]        Weight:       254.9 lb Date of Birth:  1946/11/25          BSA:          2.169 m Patient Age:    77 years          BP:           100/59 mmHg Patient Gender: F                 HR:           63 bpm. Exam Location:  [facility] Procedure: 2D Echo, Color Doppler and Cardiac Doppler Indications:     CHF I50.31  History:         Patient has prior history of Echocardiogram examinations, most                  recent 09/09/2022. CHF, CAD, ESRD, Signs/Symptoms:Chest Pain;                  Risk Factors:Dyslipidemia, Diabetes, Hypertension and Former                  Smoker.  Sonographer:     Dondra Prader RVT RCS Referring Phys:  [id number] Hubbard Hartshorn OUMA Diagnosing Phys: Julien Nordmann MD  Sonographer Comments: Patient is obese. Image acquisition challenging due to patient body habitus. IMPRESSIONS  1. Left ventricular ejection fraction, by estimation, is 60 to 65%. The left ventricle has normal function. The left ventricle has no regional wall motion abnormalities. Left ventricular diastolic parameters are indeterminate.  2. Right ventricular systolic function is moderately  reduced. The right ventricular size is moderately enlarged. There is severely elevated pulmonary artery systolic pressure. The estimated right ventricular systolic pressure is 79.0 mmHg.  3. Left atrial size was mildly dilated.  4. The mitral valve is normal in structure. Mild mitral valve regurgitation. No evidence of mitral stenosis.  5. Tricuspid valve regurgitation is moderate.  6. The aortic valve is normal in structure. Aortic valve regurgitation is not visualized. Aortic valve sclerosis is present, with no evidence of aortic valve stenosis.  7. The inferior vena cava is dilated in size with <50% respiratory variability, suggesting right atrial pressure of 15 mmHg. FINDINGS  Left Ventricle: Left ventricular ejection fraction, by estimation, is 60 to 65%. The left ventricle has normal function. The left ventricle has no regional wall motion abnormalities. The left ventricular internal cavity size was normal in size. There is  no left ventricular hypertrophy. Left ventricular diastolic parameters are indeterminate. Right Ventricle: The right ventricular size is moderately enlarged. No increase in right ventricular wall thickness. Right ventricular systolic function is moderately reduced. There is severely elevated pulmonary artery systolic pressure. The tricuspid regurgitant velocity is 4.00 m/s, and with an assumed right atrial pressure of 15 mmHg, the estimated right ventricular systolic pressure is 79.0 mmHg. Left Atrium: Left atrial size was mildly dilated. Right Atrium: Right atrial size was normal in size. Pericardium: There is no evidence of pericardial effusion. Mitral Valve: The mitral valve is normal in structure. Mild mitral valve regurgitation. No evidence of mitral valve stenosis. Tricuspid Valve: The tricuspid valve is normal in structure. Tricuspid valve regurgitation is moderate . No evidence of tricuspid stenosis. Aortic Valve: The aortic valve is normal in structure. Aortic valve regurgitation  is not visualized. Aortic valve sclerosis is present, with no evidence of aortic valve stenosis. Aortic valve mean gradient measures 6.0 mmHg. Aortic valve peak gradient measures 10.5 mmHg. Aortic valve area, by VTI measures 1.88 cm. Pulmonic Valve: The pulmonic valve was normal in structure. Pulmonic valve regurgitation is trivial. No evidence of pulmonic stenosis. Aorta: The aortic root is normal in size and structure. Venous: The inferior vena cava is dilated in size with less than 50% respiratory variability, suggesting right atrial pressure of 15 mmHg. IAS/Shunts: No atrial level shunt detected by color flow Doppler.  LEFT VENTRICLE PLAX 2D LVIDd:         5.00 cm   Diastology LVIDs:         3.20 cm   LV e' medial:    6.81 cm/s LV PW:         0.90 cm   LV E/e' medial:  14.8 LV IVS:        1.30 cm   LV e' lateral:   9.32 cm/s LVOT diam:     1.80 cm   LV E/e' lateral: 10.8 LV SV:         58 LV SV Index:   27 LVOT Area:     2.54 cm  RIGHT VENTRICLE             IVC RV Basal diam:  4.30 cm     IVC diam: 2.00 cm RV Mid diam:    4.60 cm RV S prime:     12.10 cm/s TAPSE (M-mode): 1.6 cm LEFT ATRIUM             Index        RIGHT ATRIUM           Index LA diam:        4.60 cm 2.12 cm/m   RA Area:     19.60 cm LA Vol (A2C):   78.1 ml 36.01 ml/m  RA Volume:   56.40 ml  26.01 ml/m LA Vol (A4C):   56.3 ml 25.96 ml/m LA Biplane Vol: 71.6 ml 33.02 ml/m  AORTIC VALVE  PULMONIC VALVE AV Area (Vmax):    1.59 cm      PV Vmax:       0.93 m/s AV Area (Vmean):   1.54 cm      PV Peak grad:  3.4 mmHg AV Area (VTI):     1.88 cm AV Vmax:           162.00 cm/s AV Vmean:          112.500 cm/s AV VTI:            0.310 m AV Peak Grad:      10.5 mmHg AV Mean Grad:      6.0 mmHg LVOT Vmax:         101.00 cm/s LVOT Vmean:        68.100 cm/s LVOT VTI:          0.229 m LVOT/AV VTI ratio: 0.74  AORTA Ao Root diam: 2.60 cm Ao Asc diam:  3.00 cm MITRAL VALVE                TRICUSPID VALVE MV Area (PHT): 3.31 cm     TR  Peak grad:   64.0 mmHg MV Decel Time: 229 msec     TR Mean grad:   39.0 mmHg MV E velocity: 101.00 cm/s  TR Vmax:        400.00 cm/s                             TR Vmean:       291.0 cm/s                              SHUNTS                             Systemic VTI:  0.23 m                             Systemic Diam: 1.80 cm Julien Nordmann MD Electronically signed by Julien Nordmann MD Signature Date/Time: 06/08/2023/3:07:42 PM    Final    DG Chest Portable 1 View  Result Date: 06/07/2023 CLINICAL DATA:  Sepsis.  Weakness. EXAM: PORTABLE CHEST 1 VIEW COMPARISON:  09/28/2022. FINDINGS: Stable heart size.The cardiomediastinal contours are unchanged with aortic atherosclerosis. The lungs are clear. Pulmonary vasculature is normal. No consolidation, pleural effusion, or pneumothorax. No acute osseous abnormalities are seen. IMPRESSION: No acute chest findings. Electronically Signed   By: Narda Rutherford M.D.   On: 06/07/2023 15:01   CT ABDOMEN PELVIS W CONTRAST  Result Date: 06/07/2023 CLINICAL DATA:  Abdominal pain. EXAM: CT ABDOMEN AND PELVIS WITH CONTRAST TECHNIQUE: Multidetector CT imaging of the abdomen and pelvis was performed using the standard protocol following bolus administration of intravenous contrast. RADIATION DOSE REDUCTION: This exam was performed according to the departmental dose-optimization program which includes automated exposure control, adjustment of the mA and/or kV according to patient size and/or use of iterative reconstruction technique. CONTRAST:  OMNIPAQUE IOHEXOL 300 MG/ML  SOLN COMPARISON:  05/29/2022 FINDINGS: Lower chest: Subsegmental atelectasis and/or linear scarring noted in the lung bases. Hepatobiliary: No suspicious focal abnormality within the liver parenchyma. There is no evidence for gallstones, gallbladder wall thickening, or pericholecystic fluid. No intrahepatic or extrahepatic biliary dilation. Pancreas: No focal mass lesion. No dilatation of  the main duct. No  intraparenchymal cyst. No peripancreatic edema. Spleen: No splenomegaly. No suspicious focal mass lesion. Adrenals/Urinary Tract: No adrenal nodule or mass. Both kidneys are atrophic. Tiny well-defined homogeneous low-density lesions in both kidneys are too small to characterize but are statistically most likely benign and probably cysts. No followup imaging is recommended. 9.6 cm simple cyst exophytic from the upper interpolar left kidney is mildly progressive in the interval. No evidence for hydroureter. Bladder is decompressed. Stomach/Bowel: Stomach is unremarkable. No gastric wall thickening. No evidence of outlet obstruction. Duodenum is normally positioned as is the ligament of Treitz. No small bowel wall thickening. No small bowel dilatation. The terminal ileum is normal. The appendix is normal. No gross colonic mass. No colonic wall thickening. Vascular/Lymphatic: There is mild atherosclerotic calcification of the abdominal aorta without aneurysm. There is no gastrohepatic or hepatoduodenal ligament lymphadenopathy. No retroperitoneal or mesenteric lymphadenopathy. No pelvic sidewall lymphadenopathy. Reproductive: Hysterectomy.  There is no adnexal mass. Other: No intraperitoneal free fluid. Musculoskeletal: Marked laxity of the anterior abdominal wall fascia again noted. No worrisome lytic or sclerotic osseous abnormality. IMPRESSION: 1. Stable exam. No acute findings in the abdomen or pelvis. Specifically, no findings to explain the patient's history of abdominal pain. 2. Bilateral renal atrophy with multiple simple cysts again noted. No followup imaging is recommended. 3.  Aortic Atherosclerosis (ICD10-I70.0). Electronically Signed   By: Kennith Center M.D.   On: 06/07/2023 14:59    Microbiology: Results for orders placed or performed during the hospital encounter of 06/07/23  Blood culture (routine x 2)     Status: None   Collection Time: 06/07/23 12:54 PM   Specimen: BLOOD  Result Value Ref Range  Status   Specimen Description BLOOD RIGHT ANTECUBITAL  Final   Special Requests   Final    BOTTLES DRAWN AEROBIC AND ANAEROBIC Blood Culture adequate volume   Culture   Final    NO GROWTH 5 DAYS Performed at [facility], [address] [place], [place] [address]    Report Status 06/12/2023 FINAL  Final  Blood culture (routine x 2)     Status: None   Collection Time: 06/07/23 12:54 PM   Specimen: BLOOD  Result Value Ref Range Status   Specimen Description BLOOD BLOOD RIGHT FOREARM  Final   Special Requests   Final    BOTTLES DRAWN AEROBIC AND ANAEROBIC Blood Culture adequate volume   Culture   Final    NO GROWTH 5 DAYS Performed at [facility], [address] [place], [place] [address]    Report Status 06/12/2023 FINAL  Final  MRSA Next Gen by PCR, Nasal     Status: None   Collection Time: 06/07/23 10:18 PM   Specimen: Nasal Mucosa; Nasal Swab  Result Value Ref Range Status   MRSA by PCR Next Gen NOT DETECTED NOT DETECTED Final    Comment: (NOTE) The GeneXpert MRSA Assay (FDA approved for NASAL specimens only), is one component of a comprehensive MRSA colonization surveillance program. It is not intended to diagnose MRSA infection nor to guide or monitor treatment for MRSA infections. Test performance is not FDA approved in patients less than 66 years old. Performed at [facility], [address] [place], [place] [address]     Labs: CBC: Recent Labs  Lab 06/25/23 [phone] 06/26/23 0425 06/27/23 0528 06/28/23 0756 07/01/23 0828  WBC 9.9 11.9* 10.5 10.1 9.4  HGB 10.1* 10.5* 10.3* 10.5* 10.8*  HCT 33.7* 34.4* 34.1* 34.5* 35.5*  MCV 108.4* 104.6* 104.0* 105.8* 105.3*  PLT 118* 153 138* 149* 136*   Basic Metabolic Panel: Recent Labs  Lab 06/25/23 0808 06/26/23 0425 06/28/23 0756 06/30/23 0325 07/01/23 0828  NA 135 138 137 141 139  K 4.5 4.8 4.4 4.9 4.9  CL 100 100 99 101 100  CO2 23 24 23 27 22   GLUCOSE 131* 102* 125* 108* 106*   BUN 62* 73* 73* 64* 89*  CREATININE 6.63* 7.67* 7.66* 7.37* 9.13*  CALCIUM 8.0* 8.0* 8.0* 8.3* 8.2*  PHOS 4.0 5.6* 6.0* 5.5* 7.0*   Liver Function Tests: Recent Labs  Lab 06/25/23 0808 06/26/23 0425 06/28/23 0756 06/30/23 0325 07/01/23 0828  AST 28  --   --   --   --   ALT 25  --   --   --   --   ALKPHOS 46  --   --   --   --   BILITOT 0.5  --   --   --   --   PROT 6.7  --   --   --   --   ALBUMIN 3.2*  3.1* 3.3* 3.3* 3.3* 3.4*   CBG: Recent Labs  Lab 06/30/23 1607 06/30/23 2110 07/01/23 0741 07/01/23 1309 07/01/23 1556  GLUCAP 95 149* 85 71 121*    Discharge time spent: greater than 30 minutes.  Signed: Pennie Banter, DO [facility] 07/01/2023

## 2023-07-20 ENCOUNTER — Emergency Department: Payer: Medicare Other

## 2023-07-20 ENCOUNTER — Encounter: Payer: Self-pay | Admitting: *Deleted

## 2023-07-20 ENCOUNTER — Other Ambulatory Visit: Payer: Self-pay

## 2023-07-20 ENCOUNTER — Emergency Department
Admission: EM | Admit: 2023-07-20 | Discharge: 2023-07-20 | Disposition: A | Discharge status: Skilled Nursing Facility | Payer: Medicare Other | Attending: Emergency Medicine | Admitting: Emergency Medicine

## 2023-07-20 DIAGNOSIS — E1122 Type 2 diabetes mellitus with diabetic chronic kidney disease: Secondary | ICD-10-CM | POA: Insufficient documentation

## 2023-07-20 DIAGNOSIS — R1012 Left upper quadrant pain: Secondary | ICD-10-CM | POA: Diagnosis present

## 2023-07-20 DIAGNOSIS — Z992 Dependence on renal dialysis: Secondary | ICD-10-CM | POA: Diagnosis not present

## 2023-07-20 DIAGNOSIS — N186 End stage renal disease: Secondary | ICD-10-CM | POA: Diagnosis not present

## 2023-07-20 DIAGNOSIS — G8929 Other chronic pain: Secondary | ICD-10-CM

## 2023-07-20 DIAGNOSIS — I120 Hypertensive chronic kidney disease with stage 5 chronic kidney disease or end stage renal disease: Secondary | ICD-10-CM | POA: Diagnosis not present

## 2023-07-20 LAB — CBC
HCT: 35.5 % — ABNORMAL LOW (ref 36.0–46.0)
Hemoglobin: 10.5 g/dL — ABNORMAL LOW (ref 12.0–15.0)
MCH: 31.3 pg (ref 26.0–34.0)
MCHC: 29.6 g/dL — ABNORMAL LOW (ref 30.0–36.0)
MCV: 105.7 fL — ABNORMAL HIGH (ref 80.0–100.0)
Platelets: 115 10*3/uL — ABNORMAL LOW (ref 150–400)
RBC: 3.36 MIL/uL — ABNORMAL LOW (ref 3.87–5.11)
RDW: 15.8 % — ABNORMAL HIGH (ref 11.5–15.5)
WBC: 8.4 10*3/uL (ref 4.0–10.5)
nRBC: 0 % (ref 0.0–0.2)

## 2023-07-20 LAB — BASIC METABOLIC PANEL
Anion gap: 13 (ref 5–15)
BUN: 45 mg/dL — ABNORMAL HIGH (ref 8–23)
CO2: 31 mmol/L (ref 22–32)
Calcium: 8.1 mg/dL — ABNORMAL LOW (ref 8.9–10.3)
Chloride: 92 mmol/L — ABNORMAL LOW (ref 98–111)
Creatinine, Ser: 5.22 mg/dL — ABNORMAL HIGH (ref 0.44–1.00)
GFR, Estimated: 8 mL/min — ABNORMAL LOW (ref >60)
Glucose, Bld: 102 mg/dL — ABNORMAL HIGH (ref 70–99)
Potassium: 3.8 mmol/L (ref 3.5–5.1)
Sodium: 136 mmol/L (ref 135–145)

## 2023-07-20 LAB — TROPONIN I (HIGH SENSITIVITY): Troponin I (High Sensitivity): 37 ng/L — ABNORMAL HIGH (ref <18)

## 2023-07-20 MED ORDER — DROXIDOPA 200 MG PO CAPS: 200.0000 mg | ORAL_CAPSULE | Freq: Three times a day (TID) | ORAL | 0 refills | Status: AC

## 2023-07-20 MED ORDER — DROXIDOPA 200 MG PO CAPS: 200.0000 mg | ORAL_CAPSULE | Freq: Three times a day (TID) | ORAL | 0 refills | Status: DC

## 2023-07-20 MED ORDER — ALUM & MAG HYDROXIDE-SIMETH 200-200-20 MG/5ML PO SUSP
30.0000 mL | Freq: Once | ORAL | Status: AC
  Administered 2023-07-20: 30 mL via ORAL
  Filled 2023-07-20: qty 30

## 2023-07-20 MED ORDER — DOXYCYCLINE HYCLATE 100 MG PO CAPS: 100.0000 mg | ORAL_CAPSULE | Freq: Two times a day (BID) | ORAL | 0 refills | Status: AC

## 2023-07-20 MED ORDER — DOXYCYCLINE HYCLATE 100 MG PO CAPS: 100.0000 mg | ORAL_CAPSULE | Freq: Two times a day (BID) | ORAL | 0 refills | Status: DC

## 2023-07-20 MED ORDER — FAMOTIDINE 20 MG PO TABS: 20.0000 mg | ORAL_TABLET | Freq: Two times a day (BID) | ORAL | 0 refills | Status: DC

## 2023-07-20 MED ORDER — ALUMINUM-MAGNESIUM-SIMETHICONE 200-200-20 MG/5ML PO SUSP: 30.0000 mL | Freq: Three times a day (TID) | ORAL | 0 refills | Status: DC

## 2023-07-20 MED ORDER — FAMOTIDINE 20 MG PO TABS
40.0000 mg | ORAL_TABLET | Freq: Once | ORAL | Status: AC
  Administered 2023-07-20: 40 mg via ORAL
  Filled 2023-07-20: qty 2

## 2023-07-20 NOTE — ED Notes (Signed)
Patient transported to X-ray 

## 2023-07-20 NOTE — ED Notes (Signed)
Report to blue at [facility], paper prescriptions included in d/c packet

## 2023-07-20 NOTE — ED Notes (Signed)
Await transport back to [facility] by [facility], call to [facility], notified them that pt will be returning to facility via EMS transport

## 2023-07-20 NOTE — Discharge Instructions (Addendum)
Your lab tests all look okay.  Your chest x-ray shows you might have a small pneumonia.  Take doxycycline to treat that.  I sent a refill for your droxidopa.  Continue taking antacids to soothe your stomach.

## 2023-07-20 NOTE — ED Triage Notes (Signed)
Pt is alert and oriented.  She has abdominal and CP x 1 year but the abdominal pain is increased and she feels a knot in left flank.  Spo2 100% on 2L  (home O2) and VSS.  BP 141/80 on scene and 80's systolic during transport (hx of hypotension per ems) Pt has HD via access in left arm on T,TH,S and last treatment was yesterday.

## 2023-07-20 NOTE — ED Provider Notes (Signed)
[facility] Provider Note    Event Date/Time   First MD Initiated Contact with Patient 07/20/23 1221     (approximate)   History   Chief Complaint: Abdominal Pain   HPI  Jacqueline [name] is a 77 y.o. female with a history of hypertension, diabetes, ESRD on hemodialysis is brought to the ED due to upper abdominal pain.  She indicates epigastrium and left upper quadrant.  This pain has been ongoing for more than a year, feels worse since yesterday.  No vomiting or diarrhea.  Eating and drinking fluids okay.  Compliant with her medications.  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 BP is still low and has been fluctuating - however, pt shows no signs of inadequate perfusion  --- PLAN --- -- Continue midodrine, fludrocortisone and droxidopa. --BP's in 70's/50's, MAP 50+ are acceptable -- peripheral BP's are falsely low.   A-line pressures in ICU were significantly higher and pt does NOT show signs of poor perfusion.  Mentation is normal. --Monitor mental status as sign of adequate perfusion        Physical Exam   Triage Vital Signs: ED Triage Vitals [07/20/23 1236]  Encounter Vitals Group     BP (!) 90/31     Systolic BP Percentile      Diastolic BP Percentile      Pulse Rate (!) 58     Resp 14     Temp 98.5 F (36.9 C)     Temp Source Oral     SpO2 100 %     Weight      Height      Head Circumference      Peak Flow      Pain Score      Pain Loc      Pain Education      Exclude from Growth Chart     Most recent vital signs: Vitals:   07/20/23 1251 07/20/23 1325  BP: (!) 96/47 128/72  Pulse: (!) 58 (!) 104  Resp:  16  Temp:  97.7 F (36.5 C)  SpO2: 100% 98%    General: Awake, no distress.  CV:  Good peripheral perfusion.  Regular rate and rhythm Resp:  Normal effort.  Clear to auscultation  bilaterally Abd:  No distention.  Soft with mild left upper quadrant tenderness Other:  No lower extremity edema.  Normal mental status   ED Results / Procedures / Treatments   Labs (all labs ordered are listed, but only abnormal results are displayed) Labs Reviewed  BASIC METABOLIC PANEL - Abnormal; Notable for the following components:      Result Value   Chloride 92 (*)    Glucose, Bld 102 (*)    BUN 45 (*)    Creatinine, Ser 5.22 (*)    Calcium 8.1 (*)    GFR, Estimated 8 (*)    All other components within normal limits  CBC - Abnormal; Notable for the following components:   RBC 3.36 (*)    Hemoglobin 10.5 (*)    HCT 35.5 (*)    MCV 105.7 (*)    MCHC 29.6 (*)    RDW 15.8 (*)    Platelets 115 (*)    All other components within normal limits  TROPONIN I (HIGH SENSITIVITY) - Abnormal; Notable for the following components:   Troponin I (High Sensitivity) 37 (*)  All other components within normal limits     EKG Interpreted by me Atrial fibrillation, rate of 59.  Right axis, left bundle branch block.  No acute ischemic changes.   RADIOLOGY Chest x-ray interpreted by me, no focal infiltrate, unchanged compared to prior chest x-ray on June 07, 2023.  Radiology report reviewed   PROCEDURES:  Procedures   MEDICATIONS ORDERED IN ED: Medications  alum & mag hydroxide-simeth (MAALOX/MYLANTA) 200-200-20 MG/5ML suspension 30 mL (30 mLs Oral Given 07/20/23 1425)  famotidine (PEPCID) tablet 40 mg (40 mg Oral Given 07/20/23 1425)     IMPRESSION / MDM / ASSESSMENT AND PLAN / ED COURSE  I reviewed the triage vital signs and the nursing notes.  DDx: Electrolyte abnormality, gastritis, pancreatitis, non-STEMI, chronic pain syndrome, GI perforation  Patient's presentation is most consistent with acute presentation with potential threat to life or bodily function.  Patient presents with recurrent upper abdominal pain which has been longstanding for many months.  No focal  findings.  Chest x-ray negative for air under the diaphragm.  Labs unremarkable.  Will give antacids.    Patient also reports running out of her droxidopa which was previously prescribed to her to help maintain sufficient blood pressure.  Will provide a new prescription.        FINAL CLINICAL IMPRESSION(S) / ED DIAGNOSES   Final diagnoses:  Chronic abdominal pain  ESRD on hemodialysis (HCC)     Rx / DC Orders   ED Discharge Orders     None        Note:  This document was prepared using Dragon voice recognition software and may include unintentional dictation errors.   Sharman Cheek, MD 07/20/23 [phone]

## 2023-08-09 ENCOUNTER — Other Ambulatory Visit: Payer: Self-pay

## 2023-08-09 ENCOUNTER — Emergency Department: Payer: Medicare Other

## 2023-08-09 ENCOUNTER — Emergency Department
Admission: EM | Admit: 2023-08-09 | Discharge: 2023-08-09 | Disposition: A | Discharge status: Skilled Nursing Facility | Payer: Medicare Other | Attending: Emergency Medicine | Admitting: Emergency Medicine

## 2023-08-09 DIAGNOSIS — E1122 Type 2 diabetes mellitus with diabetic chronic kidney disease: Secondary | ICD-10-CM | POA: Diagnosis not present

## 2023-08-09 DIAGNOSIS — I2510 Atherosclerotic heart disease of native coronary artery without angina pectoris: Secondary | ICD-10-CM | POA: Insufficient documentation

## 2023-08-09 DIAGNOSIS — N186 End stage renal disease: Secondary | ICD-10-CM | POA: Diagnosis not present

## 2023-08-09 DIAGNOSIS — Z992 Dependence on renal dialysis: Secondary | ICD-10-CM | POA: Diagnosis not present

## 2023-08-09 DIAGNOSIS — R040 Epistaxis: Secondary | ICD-10-CM | POA: Diagnosis present

## 2023-08-09 LAB — BASIC METABOLIC PANEL
Anion gap: 15 (ref 5–15)
BUN: 47 mg/dL — ABNORMAL HIGH (ref 8–23)
CO2: 32 mmol/L (ref 22–32)
Calcium: 8 mg/dL — ABNORMAL LOW (ref 8.9–10.3)
Chloride: 91 mmol/L — ABNORMAL LOW (ref 98–111)
Creatinine, Ser: 6.51 mg/dL — ABNORMAL HIGH (ref 0.44–1.00)
GFR, Estimated: 6 mL/min — ABNORMAL LOW (ref >60)
Glucose, Bld: 152 mg/dL — ABNORMAL HIGH (ref 70–99)
Potassium: 3.3 mmol/L — ABNORMAL LOW (ref 3.5–5.1)
Sodium: 138 mmol/L (ref 135–145)

## 2023-08-09 LAB — CBC WITH DIFFERENTIAL/PLATELET
Abs Immature Granulocytes: 0.03 10*3/uL (ref 0.00–0.07)
Basophils Absolute: 0.1 10*3/uL (ref 0.0–0.1)
Basophils Relative: 1 %
Eosinophils Absolute: 0.1 10*3/uL (ref 0.0–0.5)
Eosinophils Relative: 1 %
HCT: 36 % (ref 36.0–46.0)
Hemoglobin: 10.7 g/dL — ABNORMAL LOW (ref 12.0–15.0)
Immature Granulocytes: 0 %
Lymphocytes Relative: 6 %
Lymphs Abs: 0.6 10*3/uL — ABNORMAL LOW (ref 0.7–4.0)
MCH: 31.3 pg (ref 26.0–34.0)
MCHC: 29.7 g/dL — ABNORMAL LOW (ref 30.0–36.0)
MCV: 105.3 fL — ABNORMAL HIGH (ref 80.0–100.0)
Monocytes Absolute: 0.7 10*3/uL (ref 0.1–1.0)
Monocytes Relative: 7 %
Neutro Abs: 8.7 10*3/uL — ABNORMAL HIGH (ref 1.7–7.7)
Neutrophils Relative %: 85 %
Platelets: 117 10*3/uL — ABNORMAL LOW (ref 150–400)
RBC: 3.42 MIL/uL — ABNORMAL LOW (ref 3.87–5.11)
RDW: 15.4 % (ref 11.5–15.5)
WBC: 10.3 10*3/uL (ref 4.0–10.5)
nRBC: 0 % (ref 0.0–0.2)

## 2023-08-09 MED ORDER — LIDOCAINE-EPINEPHRINE 2 %-1:100000 IJ SOLN
20.0000 mL | Freq: Once | INTRAMUSCULAR | Status: AC
  Administered 2023-08-09: 20 mL
  Filled 2023-08-09: qty 1

## 2023-08-09 MED ORDER — TRANEXAMIC ACID 1000 MG/10ML IV SOLN
500.0000 mg | Freq: Once | INTRAVENOUS | Status: AC
  Administered 2023-08-09: 500 mg via TOPICAL
  Filled 2023-08-09: qty 10

## 2023-08-09 MED ORDER — OXYMETAZOLINE HCL 0.05 % NA SOLN
1.0000 | Freq: Once | NASAL | Status: AC
  Administered 2023-08-09: 1 via NASAL
  Filled 2023-08-09: qty 30

## 2023-08-09 MED ORDER — SILVER NITRATE-POT NITRATE 75-25 % EX MISC
1.0000 | Freq: Once | CUTANEOUS | Status: AC
  Administered 2023-08-09: 1 via TOPICAL
  Filled 2023-08-09: qty 10

## 2023-08-09 NOTE — ED Provider Notes (Signed)
[facility] Provider Note    Event Date/Time   First MD Initiated Contact with Patient 08/09/23 1216     (approximate)   History   Epistaxis   HPI  Jacqueline [name] is a 77 year old female with history of diabetes, ESRD on TTS HD, CAD, atrial fibrillation presenting to the emergency department for evaluation of epistaxis.  Patient was getting ready to go to her dialysis session earlier today when she had onset of nasal bleeding from her right naris.  She reports she has had mild bleeding previously but is usually able to be controlled, but her bleeding had lasted for several hours by the time of my initial evaluation.  Denies any recent trauma.  Is anticoagulated on Eliquis for her A-fib.  Completed her dialysis session on Thursday.  Wears oxygen at baseline, thinks 2 and half liters.  I did review her discharge summary from 07/01/2023.  At that time she presented with circulatory shock and dysautonomia requiring pressors.  Had A-fib at that time with bradycardia and was not felt to be pacemaker candidate.       Physical Exam   Triage Vital Signs: ED Triage Vitals  Encounter Vitals Group     BP 08/09/23 1205 (!) 115/49     Systolic BP Percentile --      Diastolic BP Percentile --      Pulse Rate 08/09/23 1205 78     Resp 08/09/23 1205 20     Temp 08/09/23 1205 99.8 F (37.7 C)     Temp Source 08/09/23 1205 Oral     SpO2 08/09/23 1205 (!) 88 %     Weight --      Height --      Head Circumference --      Peak Flow --      Pain Score 08/09/23 1204 0     Pain Loc --      Pain Education --      Exclude from Growth Chart --     Most recent vital signs: Vitals:   08/09/23 1205  BP: (!) 115/49  Pulse: 78  Resp: 20  Temp: 99.8 F (37.7 C)  SpO2: (!) 88%     General: Awake, interactive  HEENT: There are some dried blood within the left naris.  There is a piece of gauze in place in the right naris that has some blood saturating it.  When this  is removed, clot is removed from the right naris and some bleeding is noted.  There is blood in the mouth and patient is spitting up blood. CV:  Regular rate, good peripheral perfusion.  Resp:  Lungs clear, unlabored respirations.  Abd:  Soft, nondistended.  Neuro:  Symmetric facial movement, fluid speech   ED Results / Procedures / Treatments   Labs (all labs ordered are listed, but only abnormal results are displayed) Labs Reviewed  CBC WITH DIFFERENTIAL/PLATELET - Abnormal; Notable for the following components:      Result Value   RBC 3.42 (*)    Hemoglobin 10.7 (*)    MCV 105.3 (*)    MCHC 29.7 (*)    Platelets 117 (*)    Neutro Abs 8.7 (*)    Lymphs Abs 0.6 (*)    All other components within normal limits  BASIC METABOLIC PANEL - Abnormal; Notable for the following components:   Potassium 3.3 (*)    Chloride 91 (*)    Glucose, Bld 152 (*)    BUN  47 (*)    Creatinine, Ser 6.51 (*)    Calcium 8.0 (*)    GFR, Estimated 6 (*)    All other components within normal limits     EKG EKG independently reviewed interpreted by myself (ER attending) demonstrates:    RADIOLOGY Imaging independently reviewed and interpreted by myself demonstrates:    PROCEDURES:  Critical Care performed: No  Procedures   MEDICATIONS ORDERED IN ED: Medications  oxymetazoline (AFRIN) 0.05 % nasal spray 1 spray (1 spray Each Nare Given 08/09/23 1242)  lidocaine-EPINEPHrine (XYLOCAINE W/EPI) 2 %-1:100000 (with pres) injection 20 mL (20 mLs Infiltration Given 08/09/23 1242)  tranexamic acid (CYKLOKAPRON) injection 500 mg (500 mg Topical Given 08/09/23 1412)  silver nitrate applicators applicator 1 Stick (1 Stick Topical Given 08/09/23 1413)     IMPRESSION / MDM / ASSESSMENT AND PLAN / ED COURSE  I reviewed the triage vital signs and the nursing notes.  Differential diagnosis includes, but is not limited to, epistaxis secondary to anticoagulant use, minor trauma  Patient's presentation is most  consistent with acute illness / injury with system symptoms.  77 year old female presenting to the emergency department for evaluation of epistaxis.  Active bleeding at the time of my initial evaluation.  She had Afrin applied and gauze site with lidocaine with epi was placed.  With this, she did have improvement in her bleeding but did have some ongoing bleeding, so TXA soaked gauze was kept in place.  This once again improved, but did not completely resolve patient's bleeding.  With this, silver nitrate applicator was used to try and cauterize the area where it appeared that the bleeding was coming from along the septum of her her right naris.  Following this, patient did not appear to have any active ongoing bleeding.  All gauze was removed and she was placed back on her oxygen via nasal cannula.  She was able to maintain her oxygen saturations without further episodes of significant bleeding.  She did have some mild bleeding that was able to be controlled with direct pressure.  We did discuss further intervention such as a tamponade device/Rhino Rocket.  After discussion of risk and benefits, patient did not wish to proceed with this which I do think is reasonable.   She did miss her dialysis session today, so labs were obtained which demonstrated elevated BUN and creatinine consistent with her known history of ESRD, but fortunately reassuring potassium of 3.3.  She does report that she likely could not get dialyzed until Monday, but with her labs and absence of symptoms requiring emergent dialysis, do think discharge today is reasonable.  On reevaluation, patient did report pain in her ankle that started out in triage.  Denies trauma.  X-[name] was obtained without acute findings.  Patient was reevaluated.  She expressed concerns about possible repeat bleeding.  She reports she would like to be taken off of her Eliquis.  I did recommend that she have this discussion with her physician, but did feel that it was  reasonable to hold it for the next 2 days until she is able to get in touch with her doctor.  She expressed understanding.  Strict return precautions were provided.  Patient was discharged stable condition.     FINAL CLINICAL IMPRESSION(S) / ED DIAGNOSES   Final diagnoses:  Right-sided epistaxis     Rx / DC Orders   ED Discharge Orders     None        Note:  This document was  prepared using Conservation officer, historic buildings and may include unintentional dictation errors.   [name], MD 08/09/23 [phone]

## 2023-08-09 NOTE — Discharge Instructions (Signed)
You were seen in the emergency room today for your nosebleed.  Fortunately we are able to get this under control.  If you have recurrent bleeding, please use the Afrin you were sent home with and apply this to both naris.  Then I would recommend placing gauze inside the side of the nose that is bleeding and putting a nasal clamp in place.  Lean forward and hold pressure for about 20 minutes.  If you have severe bleeding or the bleeding is not improved following this, please return back to the emergency department. I do think it is reasonable for you to hold your Eliquis for the next 2 days.  Please discuss with your primary care doctor if they recommend discontinuing your Eliquis.  Return to the ER for any new or worsening symptoms.

## 2023-08-09 NOTE — ED Triage Notes (Signed)
[facility] reports pt coming from dialysis for nosebleed for the past hour. Pt did not start the dialysis yet. Pt is on eliquis. Pt is on 2.5L of o2 normally.

## 2023-08-11 NOTE — Group Note (Deleted)

## 2023-09-11 ENCOUNTER — Ambulatory Visit: Payer: Self-pay | Admitting: Nurse Practitioner

## 2023-09-30 ENCOUNTER — Emergency Department: Payer: Medicare Other

## 2023-09-30 ENCOUNTER — Emergency Department
Admission: EM | Admit: 2023-09-30 | Discharge: 2023-10-01 | Disposition: A | Discharge status: Home / Self Care | Payer: Medicare Other | Attending: Emergency Medicine | Admitting: Emergency Medicine

## 2023-09-30 ENCOUNTER — Other Ambulatory Visit: Payer: Self-pay

## 2023-09-30 DIAGNOSIS — Z79899 Other long term (current) drug therapy: Secondary | ICD-10-CM | POA: Insufficient documentation

## 2023-09-30 DIAGNOSIS — I959 Hypotension, unspecified: Secondary | ICD-10-CM | POA: Diagnosis not present

## 2023-09-30 DIAGNOSIS — R55 Syncope and collapse: Secondary | ICD-10-CM | POA: Insufficient documentation

## 2023-09-30 DIAGNOSIS — I4891 Unspecified atrial fibrillation: Secondary | ICD-10-CM | POA: Insufficient documentation

## 2023-09-30 DIAGNOSIS — Z992 Dependence on renal dialysis: Secondary | ICD-10-CM | POA: Diagnosis not present

## 2023-09-30 DIAGNOSIS — N186 End stage renal disease: Secondary | ICD-10-CM | POA: Insufficient documentation

## 2023-09-30 DIAGNOSIS — R251 Tremor, unspecified: Secondary | ICD-10-CM | POA: Diagnosis not present

## 2023-09-30 DIAGNOSIS — Z7901 Long term (current) use of anticoagulants: Secondary | ICD-10-CM | POA: Insufficient documentation

## 2023-09-30 LAB — COMPREHENSIVE METABOLIC PANEL
ALT: 28 U/L (ref 0–44)
AST: 42 U/L — ABNORMAL HIGH (ref 15–41)
Albumin: 4 g/dL (ref 3.5–5.0)
Alkaline Phosphatase: 125 U/L (ref 38–126)
Anion gap: 19 — ABNORMAL HIGH (ref 5–15)
BUN: 21 mg/dL (ref 8–23)
CO2: 29 mmol/L (ref 22–32)
Calcium: 8.4 mg/dL — ABNORMAL LOW (ref 8.9–10.3)
Chloride: 89 mmol/L — ABNORMAL LOW (ref 98–111)
Creatinine, Ser: 3.68 mg/dL — ABNORMAL HIGH (ref 0.44–1.00)
GFR, Estimated: 12 mL/min — ABNORMAL LOW (ref >60)
Glucose, Bld: 131 mg/dL — ABNORMAL HIGH (ref 70–99)
Potassium: 3 mmol/L — ABNORMAL LOW (ref 3.5–5.1)
Sodium: 137 mmol/L (ref 135–145)
Total Bilirubin: 1.2 mg/dL (ref 0.3–1.2)
Total Protein: 8.5 g/dL — ABNORMAL HIGH (ref 6.5–8.1)

## 2023-09-30 LAB — CBC WITH DIFFERENTIAL/PLATELET
Abs Immature Granulocytes: 0.03 10*3/uL (ref 0.00–0.07)
Basophils Absolute: 0.1 10*3/uL (ref 0.0–0.1)
Basophils Relative: 1 %
Eosinophils Absolute: 0.1 10*3/uL (ref 0.0–0.5)
Eosinophils Relative: 1 %
HCT: 39.3 % (ref 36.0–46.0)
Hemoglobin: 11.8 g/dL — ABNORMAL LOW (ref 12.0–15.0)
Immature Granulocytes: 0 %
Lymphocytes Relative: 12 %
Lymphs Abs: 1.1 10*3/uL (ref 0.7–4.0)
MCH: 30.4 pg (ref 26.0–34.0)
MCHC: 30 g/dL (ref 30.0–36.0)
MCV: 101.3 fL — ABNORMAL HIGH (ref 80.0–100.0)
Monocytes Absolute: 0.6 10*3/uL (ref 0.1–1.0)
Monocytes Relative: 7 %
Neutro Abs: 7.3 10*3/uL (ref 1.7–7.7)
Neutrophils Relative %: 79 %
Platelets: 216 10*3/uL (ref 150–400)
RBC: 3.88 MIL/uL (ref 3.87–5.11)
RDW: 16.5 % — ABNORMAL HIGH (ref 11.5–15.5)
WBC: 9.2 10*3/uL (ref 4.0–10.5)
nRBC: 0 % (ref 0.0–0.2)

## 2023-09-30 MED ORDER — DROXIDOPA 100 MG PO CAPS
200.0000 mg | ORAL_CAPSULE | Freq: Once | ORAL | Status: AC
  Administered 2023-09-30: 200 mg via ORAL
  Filled 2023-09-30: qty 2

## 2023-09-30 NOTE — ED Provider Notes (Addendum)
[facility] Provider Note    Event Date/Time   First MD Initiated Contact with Patient 09/30/23 1637     (approximate)   History   Hypotension   HPI  Jacqueline [name] is a 77 y.o. female with ESRD,, A-fib on Eliquis, coronary disease, who comes in with concerns for syncopal episode.  Patient was at dialysis when she reports she had a syncopal episode with some little bit of shaking.  They noted her blood pressure was in the 80s.  She reports taking her midodrine 20 mg before dialysis.  However she is supposed to be on Droxidipo and fludrocortisone.  However it sounds like patient has not been able to to get the droxidopa.  Per the hospital discharge summary that I reviewed patient's blood pressures typically run in the 70s/ 50s and when she was hooked up to the A-line they were actually much higher this and her peripheral blood pressures are therefore falsely low.  Patient herself from reports a mild headache but otherwise states that she is feeling well.  Physical Exam   Triage Vital Signs: ED Triage Vitals [09/30/23 1643]  Encounter Vitals Group     BP      Systolic BP Percentile      Diastolic BP Percentile      Pulse      Resp      Temp      Temp src      SpO2      Weight 241 lb (109.3 kg)     Height 5\' 5"  (1.651 m)     Head Circumference      [facility] Flow      Pain Score 4     Pain Loc      Pain Education      Exclude from Growth Chart     Most recent vital signs: Vitals:   09/30/23 1658  BP: 90/69  Pulse: 78  Resp: 14  Temp: 97.9 F (36.6 C)  SpO2: 100%     General: Awake, no distress.  CV:  Good peripheral perfusion.  Resp:  Normal effort.  Abd:  No distention.  Soft and nontender Other:  Dialysis catheter noted.  Able to lift both legs up off the bed good equal squeeze of hands.   ED Results / Procedures / Treatments   Labs (all labs ordered are listed, but only abnormal results are displayed) Labs Reviewed  CBC WITH  DIFFERENTIAL/PLATELET - Abnormal; Notable for the following components:      Result Value   Hemoglobin 11.8 (*)    MCV 101.3 (*)    RDW 16.5 (*)    All other components within normal limits  COMPREHENSIVE METABOLIC PANEL - Abnormal; Notable for the following components:   Potassium 3.0 (*)    Chloride 89 (*)    Glucose, Bld 131 (*)    Creatinine, Ser 3.68 (*)    Calcium 8.4 (*)    Total Protein 8.5 (*)    AST 42 (*)    GFR, Estimated 12 (*)    Anion gap 19 (*)    All other components within normal limits     EKG  My interpretation of EKG:  A-fib with a rate of 74 without any ST elevation or T wave inversions, QTc of 515  RADIOLOGY I have reviewed the CT head personally and interpreted no evidence of intracranial hemorrhage   PROCEDURES:  Critical Care performed: No  .1-3 Lead EKG Interpretation  Performed by:  Concha Se, MD Authorized by: Concha Se, MD     Interpretation: normal     ECG rate:  70   ECG rate assessment: normal     Rhythm: sinus rhythm     Ectopy: none     Conduction: normal      MEDICATIONS ORDERED IN ED: Medications  droxidopa (NORTHERA) capsule 200 mg (200 mg Oral Given 09/30/23 1725)     IMPRESSION / MDM / ASSESSMENT AND PLAN / ED COURSE  I reviewed the triage vital signs and the nursing notes.   Patient's presentation is most consistent with acute presentation with potential threat to life or bodily function.   Patient comes in during dialysis with concerns for hypotension and episode of syncope.  Suspect this is related to fluid shifts related to the dialysis.  Patient reports already taking her midodrine her blood pressures here are okay.  She reports that they have been having difficulty getting her other medication droxidopa which I have given her a dose here.  She reports a mild headache so CT scan ordered evaluate for intracranial hemorrhage basic labs ordered evaluate for her electrolytes she denies any chest pain, shortness  of breath do not like this is cardiac in nature given the hypotension and this happening while at dialysis this is secondary to fluid shifts and her hypotension. QTC slightly prolong but stable from prior ekgs and again I think it was more likely from fluid shifts/  Her potassium is 3 but she just finished dialysis.  CBC shows stable hemoglobin.  Patient states that she supposed be on the droxidopa 3 times daily but they have been having difficulties getting it from the pharmacies.  She states that is why she thinks her blood pressure was low in dialysis.  After giving her dose here today she reports feeling much better and is tolerating eating and feels comfortable with discharge home.  The patient is on the cardiac monitor to evaluate for evidence of arrhythmia and/or significant heart rate changes.      FINAL CLINICAL IMPRESSION(S) / ED DIAGNOSES   Final diagnoses:  Syncope, unspecified syncope type  Hypotension, unspecified hypotension type     Rx / DC Orders   ED Discharge Orders     None        Note:  This document was prepared using Dragon voice recognition software and may include unintentional dictation errors.   Concha Se, MD 09/30/23 Aretha Parrot    Concha Se, MD 09/30/23 [phone]

## 2023-09-30 NOTE — ED Notes (Signed)
Pt remains aox4 denies further needs deemed appropriate for dc to [facility] - attempt to contact NH to provide return information on hold no answer for more than 4 minutes Christine staff of [facility] responds verbalizes understanding and agrres to provide report to staff denies further needs / inquiries previous [facility] surgeon attempt with no answer pt w ith non labored resps bp 122 64 hr 92 rr 16 O2 93%

## 2023-09-30 NOTE — Discharge Instructions (Addendum)
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

## 2023-09-30 NOTE — ED Triage Notes (Signed)
Patient to ED via [facility] from dialysis for hypotension. Patient normally takes medications to increase her BP but states she ran out. The last time she received the medication was yesterday morning. Dialysis called EMS today because they stated the patient "shook and then passed out". Upon EMS arrival, the patient's BP was 87/58. The patient did complete dialysis today. The patient has a Hx of afib. Patient is A&Ox4 in triage.

## 2023-09-30 NOTE — ED Notes (Addendum)
Called facility to verify pt home BP medications, no answer. Was told to call back.

## 2023-11-12 ENCOUNTER — Encounter (HOSPITAL_COMMUNITY)
Admission: RE | Disposition: A | Discharge status: Home / Self Care | Payer: Self-pay | Source: Home / Self Care | Attending: Nephrology

## 2023-11-12 ENCOUNTER — Encounter (HOSPITAL_COMMUNITY): Payer: Self-pay | Admitting: Nephrology

## 2023-11-12 ENCOUNTER — Other Ambulatory Visit: Payer: Self-pay

## 2023-11-12 ENCOUNTER — Ambulatory Visit (HOSPITAL_COMMUNITY)
Admission: RE | Admit: 2023-11-12 | Discharge: 2023-11-12 | Disposition: A | Discharge status: Home / Self Care | Payer: Medicare Other | Attending: Nephrology | Admitting: Nephrology

## 2023-11-12 DIAGNOSIS — T82858A Stenosis of vascular prosthetic devices, implants and grafts, initial encounter: Secondary | ICD-10-CM | POA: Insufficient documentation

## 2023-11-12 DIAGNOSIS — T82838A Hemorrhage of vascular prosthetic devices, implants and grafts, initial encounter: Secondary | ICD-10-CM | POA: Diagnosis present

## 2023-11-12 DIAGNOSIS — I120 Hypertensive chronic kidney disease with stage 5 chronic kidney disease or end stage renal disease: Secondary | ICD-10-CM | POA: Diagnosis not present

## 2023-11-12 DIAGNOSIS — D631 Anemia in chronic kidney disease: Secondary | ICD-10-CM | POA: Insufficient documentation

## 2023-11-12 DIAGNOSIS — Z992 Dependence on renal dialysis: Secondary | ICD-10-CM | POA: Insufficient documentation

## 2023-11-12 DIAGNOSIS — Z794 Long term (current) use of insulin: Secondary | ICD-10-CM | POA: Diagnosis not present

## 2023-11-12 DIAGNOSIS — Y832 Surgical operation with anastomosis, bypass or graft as the cause of abnormal reaction of the patient, or of later complication, without mention of misadventure at the time of the procedure: Secondary | ICD-10-CM | POA: Insufficient documentation

## 2023-11-12 DIAGNOSIS — Z87891 Personal history of nicotine dependence: Secondary | ICD-10-CM | POA: Insufficient documentation

## 2023-11-12 DIAGNOSIS — Z833 Family history of diabetes mellitus: Secondary | ICD-10-CM | POA: Insufficient documentation

## 2023-11-12 DIAGNOSIS — Z7901 Long term (current) use of anticoagulants: Secondary | ICD-10-CM | POA: Diagnosis not present

## 2023-11-12 DIAGNOSIS — E1122 Type 2 diabetes mellitus with diabetic chronic kidney disease: Secondary | ICD-10-CM | POA: Diagnosis not present

## 2023-11-12 DIAGNOSIS — N186 End stage renal disease: Secondary | ICD-10-CM | POA: Insufficient documentation

## 2023-11-12 DIAGNOSIS — I4819 Other persistent atrial fibrillation: Secondary | ICD-10-CM | POA: Diagnosis not present

## 2023-11-12 DIAGNOSIS — Z8249 Family history of ischemic heart disease and other diseases of the circulatory system: Secondary | ICD-10-CM | POA: Diagnosis not present

## 2023-11-12 HISTORY — PX: A/V FISTULAGRAM: CATH118298

## 2023-11-12 HISTORY — PX: PERIPHERAL VASCULAR BALLOON ANGIOPLASTY: CATH118281

## 2023-11-12 LAB — GLUCOSE, CAPILLARY: Glucose-Capillary: 166 mg/dL — ABNORMAL HIGH (ref 70–99)

## 2023-11-12 SURGERY — A/V FISTULAGRAM
Anesthesia: LOCAL

## 2023-11-12 MED ORDER — HEPARIN (PORCINE) IN NACL 1000-0.9 UT/500ML-% IV SOLN
INTRAVENOUS | Status: DC | PRN
  Administered 2023-11-12: 500 mL

## 2023-11-12 MED ORDER — ONDANSETRON HCL 4 MG/2ML IJ SOLN
4.0000 mg | Freq: Four times a day (QID) | INTRAMUSCULAR | Status: DC | PRN
Start: 2023-11-12 — End: 2023-11-12

## 2023-11-12 MED ORDER — SODIUM CHLORIDE 0.9% FLUSH: 10.0000 mL | Freq: Two times a day (BID) | INTRAVENOUS | Status: DC

## 2023-11-12 MED ORDER — MIDAZOLAM HCL 2 MG/2ML IJ SOLN
INTRAMUSCULAR | Status: DC | PRN
  Administered 2023-11-12: 1 mg via INTRAVENOUS

## 2023-11-12 MED ORDER — ACETAMINOPHEN 325 MG PO TABS: 650.0000 mg | ORAL_TABLET | ORAL | Status: DC | PRN

## 2023-11-12 MED ORDER — FENTANYL CITRATE (PF) 100 MCG/2ML IJ SOLN
INTRAMUSCULAR | Status: AC
  Filled 2023-11-12: qty 2

## 2023-11-12 MED ORDER — LIDOCAINE HCL (PF) 1 % IJ SOLN
INTRAMUSCULAR | Status: AC
  Filled 2023-11-12: qty 30

## 2023-11-12 MED ORDER — IODIXANOL 320 MG/ML IV SOLN
INTRAVENOUS | Status: DC | PRN
  Administered 2023-11-12: 20 mL via INTRAVENOUS

## 2023-11-12 MED ORDER — LIDOCAINE HCL (PF) 1 % IJ SOLN
INTRAMUSCULAR | Status: DC | PRN
  Administered 2023-11-12: 5 mL via INTRADERMAL

## 2023-11-12 MED ORDER — MIDAZOLAM HCL 2 MG/2ML IJ SOLN
INTRAMUSCULAR | Status: AC
  Filled 2023-11-12: qty 2

## 2023-11-12 MED ORDER — FENTANYL CITRATE (PF) 100 MCG/2ML IJ SOLN
INTRAMUSCULAR | Status: DC | PRN
  Administered 2023-11-12: 25 ug via INTRAVENOUS

## 2023-11-12 MED ORDER — SODIUM CHLORIDE 0.9% FLUSH
3.0000 mL | INTRAVENOUS | Status: DC | PRN
Start: 2023-11-12 — End: 2023-11-12

## 2023-11-12 SURGICAL SUPPLY — 11 items
BAG SNAP BAND KOVER 36X36 (MISCELLANEOUS) ×3 IMPLANT
BALLN MUSTANG 10.0X40 75 (BALLOONS) ×2
BALLOON MUSTANG 10.0X40 75 (BALLOONS) IMPLANT
COVER DOME SNAP 22 D (MISCELLANEOUS) ×3 IMPLANT
PROTECTION STATION PRESSURIZED (MISCELLANEOUS) ×2
SHEATH PINNACLE R/O II 7F 4CM (SHEATH) IMPLANT
SHEATH PROBE COVER 6X72 (BAG) ×3 IMPLANT
STATION PROTECTION PRESSURIZED (MISCELLANEOUS) ×3 IMPLANT
SYR MEDALLION 10ML (SYRINGE) IMPLANT
TRAY PV CATH (CUSTOM PROCEDURE TRAY) ×3 IMPLANT
WIRE STARTER BENTSON 035X150 (WIRE) IMPLANT

## 2023-11-12 NOTE — Op Note (Signed)
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 Glenna Fellows. On examination, the brachial cephalic fistula is hyperpulsatile towards the outflow. Augmentation is normal. 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   Summary:  1)      The patient had successful angioplasty (10 95% ~18 atm) of significant stenosis in the cephalic vein arch at the confluence.  2)      The body of the cephalic vein fistula and inflow were patent with good flows. 3)      The centrals were widely patent. 4)      This left BCF remains amenable to future percutaneous intervention.  Description of procedure: The arm was prepped and draped in the usual sterile fashion. The left upper arm brachial cephalic fistula was cannulated (16109) with an 18G Angiocath needle directed in an antegrade direction. A guidewire was inserted and exchanged for a 7Fr sheath. Contrast [phone]) injection via the side port of the sheath was performed. 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 The inflow anastomosis and left centrals were patent.  The wire was advanced centrally without any difficulty. An 10 mm Mustang angioplasty balloon was inserted over a glide wire and positioned at the cephalic vein arch stenosis. Venous angioplasty (91478) was carried out to 18-20 ATM with 95% effacement of the waist on on the balloon at the arch lesion.  Repeat angiogram showed 10% residual at the site of angioplasty with no evidence of extravasation.  The flow of contrast was quicker and the fistula was markedly less pulsatile.  Hemostasis: A 3-0 ethilon purse string suture was placed at the cannulation site on removal of the sheath.  Sedation: 1 mg Versed, 25 mcg Fentanyl.  Contrast. 20 mL  Monitoring:  Because of the patient's comorbid conditions and sedation during the procedure, continuous EKG monitoring and O2 saturation monitoring was performed throughout the procedure by the RN. There were no abnormal arrhythmias encountered.  Complications: None.   Diagnoses: I87.1 Stricture of vein  N18.6 ESRD T82.858A Stricture of access  Procedure Coding:  [phone] Cannulation and angiogram of fistula, venous angioplasty (cephalic vein arch) Z3086 Contrast  Recommendations:  1. Continue to cannulate the fistula with 15G needles.  2. Refer back for problems with flows. 3. Remove the suture next treatment.   Discharge: The patient was discharged home in stable condition. The patient was given education regarding the care of the dialysis access AVF and specific instructions in case of any problems.

## 2023-11-12 NOTE — Discharge Instructions (Signed)

## 2023-11-12 NOTE — H&P (Addendum)
Chief Complaint: Longer bleeding   Interval H&P  The patient has presented today for an angiogram/ angioplasty; patient is followed at [facility] kidney center with Dr. Allena Katz.  Various methods of treatment have been discussed with the patient.  After consideration of risk, benefits and other options for treatment, the patient has consented to a angiogram/ angioplasty with  possible stent placement.   Risks of angiogram with potential angioplasty and stenting if needed.contrast reaction, extravasation/ bleeding, dissection, hypotension and death were explained to the patient.  The patient's history has been reviewed and the patient has been examined, no changes in status.  Stable for angiogram/angioplasty  I have reviewed the patient's chart and labs.  Questions were answered to the patient's satisfaction.  Assessment/Plan: ESRD dialyzing at [facility] TTS regimen with last dialysis on Tuesday for a full treatment Prolonged bleeding- planning on angiogram with possibly angioplasty of the outflow. Renal osteodystrophy - continue binders per home regimen Anemia - managed with ESA's and IV iron at dialysis center. HTN - resume home regimen.   HPI: Jacqueline [name] is an 77 y.o. female   severe pulmonary retention, anemia of chronic kidney disease, hypotension requiring midodrine, diabetes, ESRD dialyzing with Dr. Allena Katz with [facility] in [place].  Patient's last full treatment was on Tuesday and she left above her dry weight but  prior to that she left below her dry weight at which time her dry weight was adjusted to 107 kg.   Patient states that she has been having prolonged bleeding from her dialysis cannulation sites.  Patient's last outflow 10 mm angioplasty of the cephalic arch was on Apr 21, 2023 by Dr. Glenna Fellows.  ROS Per HPI.  Chemistry and CBC: Creatinine, Ser  Date/Time Value Ref Range Status  09/30/2023 05:31 PM 3.68 (H) 0.44 - 1.00 mg/dL Final  62/95/2841 32:44 PM 6.51  (H) 0.44 - 1.00 mg/dL Final  12/04/7251 66:44 PM 5.22 (H) 0.44 - 1.00 mg/dL Final  03/47/4259 56:38 AM 9.13 (H) 0.44 - 1.00 mg/dL Final  75/64/3329 51:88 AM 7.37 (H) 0.44 - 1.00 mg/dL Final  41/66/0630 16:01 AM 7.66 (H) 0.44 - 1.00 mg/dL Final  09/32/3557 32:20 AM 7.67 (H) 0.44 - 1.00 mg/dL Final  25/42/7062 37:62 AM 6.63 (H) 0.44 - 1.00 mg/dL Final  83/15/1761 60:73 AM 7.56 (H) 0.44 - 1.00 mg/dL Final  71/05/2693 85:46 AM 6.21 (H) 0.44 - 1.00 mg/dL Final  27/01/5008 38:18 AM 4.62 (H) 0.44 - 1.00 mg/dL Final  29/93/7169 67:89 AM 6.46 (H) 0.44 - 1.00 mg/dL Final  38/09/1750 02:58 AM 5.10 (H) 0.44 - 1.00 mg/dL Final  52/77/8242 35:36 AM 6.55 (H) 0.44 - 1.00 mg/dL Final  14/43/1540 08:67 AM 5.00 (H) 0.44 - 1.00 mg/dL Final  61/95/0932 67:12 AM 7.25 (H) 0.44 - 1.00 mg/dL Final  45/80/9983 38:25 AM 5.64 (H) 0.44 - 1.00 mg/dL Final  05/39/7673 41:93 AM 4.10 (H) 0.44 - 1.00 mg/dL Final  79/01/4096 35:32 AM 5.56 (H) 0.44 - 1.00 mg/dL Final  99/24/2683 41:96 AM 4.60 (H) 0.44 - 1.00 mg/dL Final  22/29/7989 21:19 AM 4.80 (H) 0.44 - 1.00 mg/dL Final  41/74/0814 48:18 PM 6.22 (H) 0.44 - 1.00 mg/dL Final  56/31/4970 26:37 AM 7.69 (H) 0.44 - 1.00 mg/dL Final  85/88/5027 74:12 AM 5.89 (H) 0.44 - 1.00 mg/dL Final  87/86/7672 09:47 AM 9.89 (H) 0.44 - 1.00 mg/dL Final  09/62/8366 29:47 AM 8.41 (H) 0.44 - 1.00 mg/dL Final  65/46/5035 46:56 PM 7.76 (H) 0.44 - 1.00 mg/dL Final  12/26/2022 01:55 PM 3.50 (H) 0.44 - 1.00 mg/dL Final  32/44/0102 72:53 AM 5.19 (H) 0.44 - 1.00 mg/dL Final  66/44/0347 42:59 AM 6.03 (H) 0.44 - 1.00 mg/dL Final  56/38/7564 33:29 AM 9.67 (H) 0.44 - 1.00 mg/dL Final  51/88/4166 06:30 AM 8.33 (H) 0.44 - 1.00 mg/dL Final  16/12/930 35:57 AM 7.07 (H) 0.44 - 1.00 mg/dL Final  32/20/2542 70:62 AM 8.21 (H) 0.44 - 1.00 mg/dL Final  37/62/8315 17:61 AM 6.65 (H) 0.44 - 1.00 mg/dL Final  60/73/7106 26:94 AM 8.37 (H) 0.44 - 1.00 mg/dL Final  85/46/2703 50:09 AM 5.75 (H) 0.44 - 1.00 mg/dL  Final  38/18/2993 71:69 AM 5.04 (H) 0.44 - 1.00 mg/dL Final  67/89/3810 17:51 PM 3.98 (H) 0.44 - 1.00 mg/dL Final  02/58/5277 82:42 PM 4.85 (H) 0.44 - 1.00 mg/dL Final  35/36/1443 15:40 PM 4.81 (H) 0.44 - 1.00 mg/dL Final  08/67/6195 09:32 PM 7.21 (H) 0.44 - 1.00 mg/dL Final  67/11/4579 99:83 AM 7.94 (H) 0.57 - 1.00 mg/dL Final  38/25/0539 76:73 AM 7.59 (H) 0.57 - 1.00 mg/dL Final  41/93/7902 40:97 AM 8.54 (H) 0.57 - 1.00 mg/dL Final  35/32/9924 26:83 PM 7.19 (H) 0.44 - 1.00 mg/dL Final  41/96/2229 79:89 AM 9.87 (H) 0.57 - 1.00 mg/dL Final  21/19/4174 08:14 AM 8.70 (H) 0.57 - 1.00 mg/dL Final  48/18/5631 49:70 AM 11.79 (H) 0.44 - 1.00 mg/dL Final  26/37/8588 50:27 AM 9.08 (H) 0.44 - 1.00 mg/dL Final  74/11/8785 76:72 AM 7.44 (H) 0.44 - 1.00 mg/dL Final  09/47/0962 83:66 AM 9.86 (H) 0.57 - 1.00 mg/dL Final   No results for input(s): "NA", "K", "CL", "CO2", "GLUCOSE", "BUN", "CREATININE", "CALCIUM", "PHOS" in the last 168 hours.  Invalid input(s): "ALB" No results for input(s): "WBC", "NEUTROABS", "HGB", "HCT", "MCV", "PLT" in the last 168 hours. Liver Function Tests: No results for input(s): "AST", "ALT", "ALKPHOS", "BILITOT", "PROT", "ALBUMIN" in the last 168 hours. No results for input(s): "LIPASE", "AMYLASE" in the last 168 hours. No results for input(s): "AMMONIA" in the last 168 hours. Cardiac Enzymes: No results for input(s): "CKTOTAL", "CKMB", "CKMBINDEX", "TROPONINI" in the last 168 hours. Iron Studies: No results for input(s): "IRON", "TIBC", "TRANSFERRIN", "FERRITIN" in the last 72 hours. PT/INR: @LABRCNTIP (inr:5)  Xrays/Other Studies: )No results found for this or any previous visit (from the past 48 hour(s)). No results found.  PMH:   Past Medical History:  Diagnosis Date   Arthritis    "left knee" (12/04/2016)   CAD (coronary artery disease)    a. 12/2016 NSTEMI/PCI: LM nl, LaD 20p/d, D1 20, OM2 20, OM3 20, RCA 13m (3.0x18 Resolute DES). EF 65%.   Chronic lower back  pain    Diverticulosis    on CT   ESRD on dialysis [facility])    GERD (gastroesophageal reflux disease)    Hiatal hernia    History of echocardiogram    a. 09/2022 Echo: EF 60-65%, mild asymm LVH, mod reduced RV fxn, RVSP . Mildly dil RA. Mild AI. Ao sclerosis. Mild MR.   Hyperlipidemia    Hypertension    a. 12/2016 labetalol d/c'd 2/2 hypotension in HD.   Morbid obesity (HCC)    Obesity    Persistent atrial fibrillation (HCC)    a. 05/2023 Zio: 100% afib avg HR 52 (32-99).   Pneumonia    "couple times" (12/04/2016)   Symptomatic bradycardia 06/07/2023   Type II diabetes mellitus (HCC)     PSH:   Past Surgical History:  Procedure Laterality Date  ABDOMINAL HYSTERECTOMY     AV FISTULA PLACEMENT Left 01/27/2014   Procedure: ARTERIOVENOUS (AV) FISTULA CREATION;  Surgeon: Pryor Ochoa, MD;  Location: [facility] OR;  Service: Vascular;  Laterality: Left;   AV FISTULA REPAIR     "had it cleaned out"   CARDIAC CATHETERIZATION  ?1980s   CARDIAC CATHETERIZATION N/A 12/04/2016   Procedure: Left Heart Cath Angiography;  Surgeon: Kathleene Hazel, MD;  Location: [facility] INVASIVE CV LAB;  Service: Cardiovascular;  Laterality: N/A;   CARDIAC CATHETERIZATION N/A 12/04/2016   Procedure: Coronary Stent Intervention;  Surgeon: Kathleene Hazel, MD;  Location: [facility] INVASIVE CV LAB;  Service: Cardiovascular;  Laterality: N/A;  Mid RCA   CATARACT EXTRACTION W/ INTRAOCULAR LENS IMPLANT Left    COLONOSCOPY W/ BIOPSIES AND POLYPECTOMY     CORONARY ANGIOPLASTY WITH STENT PLACEMENT  12/04/2016   ESOPHAGOGASTRODUODENOSCOPY     INSERTION OF DIALYSIS CATHETER N/A 01/19/2014   Procedure: INSERTION OF DIALYSIS CATHETER;  Surgeon: Larina Earthly, MD;  Location: [facility] OR;  Service: Vascular;  Laterality: N/A;   RIGHT/LEFT HEART CATH AND CORONARY ANGIOGRAPHY N/A 06/11/2023   Procedure: RIGHT/LEFT HEART CATH AND CORONARY ANGIOGRAPHY;  Surgeon: Yvonne Kendall, MD;  Location: [facility] INVASIVE CV LAB;  Service: Cardiovascular;   Laterality: N/A;    Allergies:  Allergies  Allergen Reactions   Sulfa Antibiotics Other (See Comments)   Sulfa Drugs Cross Reactors Other (See Comments)    Doesn't remember     Medications:   Prior to Admission medications   Medication Sig Start Date End Date Taking? Authorizing Provider  acetaminophen (TYLENOL) 650 MG CR tablet Take 650 mg by mouth every 8 (eight) hours. (0600, 1400 & 2200)   Yes [provider]  albuterol (PROVENTIL) (2.5 MG/3ML) 0.083% nebulizer solution Take 2.5 mg by nebulization every 6 (six) hours as needed for wheezing or shortness of breath.   Yes [provider]  allopurinol (ZYLOPRIM) 100 MG tablet Take 100 mg by mouth daily.   Yes [provider]  aluminum-magnesium hydroxide-simethicone (MAALOX) 200-200-20 MG/5ML SUSP Take 30 mLs by mouth 4 (four) times daily -  before meals and at bedtime. Patient taking differently: Take 15 mLs by mouth every 4 (four) hours as needed (heartburn.). 07/20/23  Yes Sharman Cheek, MD  apixaban (ELIQUIS) 5 MG TABS tablet Take 1 tablet (5 mg total) by mouth 2 (two) times daily. 07/01/23  Yes Esaw Grandchild A, DO  bisacodyl (DULCOLAX) 10 MG suppository Place 10 mg rectally daily as needed for moderate constipation.   Yes [provider]  cinacalcet (SENSIPAR) 30 MG tablet Take 30 mg by mouth daily with supper. (1800)   Yes [provider]  clotrimazole-betamethasone (LOTRISONE) cream Apply 1 Application topically See admin instructions. Apply as needed to affected area of ear for itching/irritation.   Yes [provider]  dextromethorphan-guaiFENesin (MUCINEX DM) 30-600 MG 12hr tablet Take 1 tablet by mouth 2 (two) times daily. (0900 & 1800)   Yes [provider]  Droxidopa 200 MG CAPS Take 200 mg by mouth with breakfast, with lunch, and with evening meal.   Yes [provider]  ezetimibe (ZETIA) 10 MG tablet Take 1 tablet (10 mg total) by mouth daily. Patient  taking differently: Take 10 mg by mouth every evening. (1800) 04/30/22  Yes Iran Ouch, MD  fexofenadine (ALLEGRA) 180 MG tablet Take 180 mg by mouth in the morning. (0800)   Yes [provider]  fludrocortisone (FLORINEF) 0.1 MG tablet Take 2 tablets (0.2  mg total) by mouth 2 (two) times daily. 07/01/23  Yes Esaw Grandchild A, DO  fluticasone (FLONASE) 50 MCG/ACT nasal spray Place 1 spray into both nostrils in the morning. (0900)   Yes [provider]  guaiFENesin-dextromethorphan (ROBITUSSIN DM) 100-10 MG/5ML syrup Take 15 mLs by mouth every 4 (four) hours as needed for cough. 07/01/23  Yes Esaw Grandchild A, DO  HYDROcodone-acetaminophen (NORCO/VICODIN) 5-325 MG tablet Take 1-2 tablets by mouth every 6 (six) hours as needed for moderate pain. 07/01/23  Yes Esaw Grandchild A, DO  insulin aspart (NOVOLOG) 100 UNIT/ML injection Inject 0-20 Units into the skin 3 (three) times daily with meals. For CBG 70-120: 0 units For CBG 121-150: 3 units For CBG 151-200: 4 units For CBG 201-250: 7 units For CBG 251-300: 11 units For CBG 301-350: 15 units For CBG 351-400: 20 units For CBG > 400: 25 units and call your doctor 07/01/23  Yes Esaw Grandchild A, DO  insulin glargine-yfgn (SEMGLEE) 100 UNIT/ML injection Inject 0.05 mLs (5 Units total) into the skin daily. 07/02/23  Yes Esaw Grandchild A, DO  Lidocaine HCl (ASPERCREME LIDOCAINE) 4 % CREA Apply 1 Application topically in the morning and at bedtime. Applied to bilateral knees   Yes [provider]  magnesium hydroxide (MILK OF MAGNESIA) 400 MG/5ML suspension Take 30 mLs by mouth daily as needed (if no BM in 3 days.).   Yes [provider]  Menthol-Zinc Oxide (MOISTURE BARRIER EX) Apply 1 Application topically as directed. Apply barrier cream to buttocks every shift   Yes [provider]  midodrine (PROAMATINE) 10 MG tablet Take 1 tablet (10 mg total) by mouth 3 (three) times daily with meals. 06/09/23  Yes  Harlon Ditty D, NP  midodrine (PROAMATINE) 10 MG tablet Take 20 mg by mouth as needed (for systolic BP less than 100.).   Yes [provider]  Multiple Vitamin (MULTIVITAMIN WITH MINERALS) TABS tablet Take 1 tablet by mouth at bedtime.   Yes [provider]  naloxone (NARCAN) nasal spray 4 mg/0.1 mL Place 1 spray into the nose 3 (three) times daily as needed (signs and symptoms of opioid overdose).   Yes [provider]  nitroGLYCERIN (NITROSTAT) 0.4 MG SL tablet Place 1 tablet (0.4 mg total) under the tongue every 5 (five) minutes as needed for chest pain. 07/01/23  Yes Esaw Grandchild A, DO  ondansetron (ZOFRAN-ODT) 4 MG disintegrating tablet Take 4 mg by mouth every 8 (eight) hours as needed for nausea or vomiting.   Yes [provider]  OXYGEN Inhale 2 L/min into the lungs as needed (shortness of breath or decreased O2).   Yes [provider]  pantoprazole (PROTONIX) 40 MG tablet Take 1 tablet (40 mg total) by mouth daily. 07/02/23  Yes Esaw Grandchild A, DO  polyethylene glycol (MIRALAX / GLYCOLAX) 17 g packet Take 17 g by mouth daily as needed for moderate constipation. 06/09/23  Yes Harlon Ditty D, NP  polyvinyl alcohol (LIQUIFILM TEARS) 1.4 % ophthalmic solution Place 1 drop into both eyes as needed for dry eyes. Patient taking differently: Place 1 drop into both eyes every 4 (four) hours as needed for dry eyes. 07/01/23  Yes Esaw Grandchild A, DO  rosuvastatin (CRESTOR) 10 MG tablet Take 1 tablet (10 mg total) by mouth daily. Patient taking differently: Take 10 mg by mouth every evening. 10/03/22  Yes Sreenath, Sudheer B, MD  sodium phosphate (FLEET) ENEM Place 1 enema rectally daily as needed for severe constipation (if no  BM results in 12 hours from dulcolax suppository).   Yes [provider]  sorbitol 70 % solution Take 30 mLs by mouth daily as needed (constipation.).   Yes [provider]  ferric citrate (AURYXIA) 1 GM 210  MG(Fe) tablet Take 420 mg by mouth 3 (three) times daily with meals.    [provider]    Discontinued Meds:   Medications Discontinued During This Encounter  Medication Reason   acetaminophen (TYLENOL) 325 MG tablet Dose change   alteplase (CATHFLO ACTIVASE) 2 MG injection No longer needed (for PRN medications)   anticoagulant sodium citrate 4 GM/100ML SOLN No longer needed (for PRN medications)   bisacodyl (DULCOLAX) 5 MG EC tablet Change in therapy   bisacodyl (DULCOLAX) 5 MG EC tablet Change in therapy   Chlorhexidine Gluconate Cloth 2 % PADS Completed Course   docusate sodium (COLACE) 100 MG capsule Change in therapy   famotidine (PEPCID) 20 MG tablet Change in therapy   insulin aspart (NOVOLOG) 100 UNIT/ML injection Duplicate   heparin 1000 unit/mL SOLN injection Inpatient Standard   lidocaine, PF, (XYLOCAINE) 1 % SOLN injection Inpatient Standard   lidocaine-prilocaine (EMLA) cream Inpatient Standard   multivitamin (RENA-VIT) TABS tablet Patient Preference   Nutritional Supplements (FEEDING SUPPLEMENT, NEPRO CARB STEADY,) LIQD Patient has not taken in last 30 days   ondansetron (ZOFRAN) 4 MG/2ML SOLN injection Change in therapy   pentafluoroprop-tetrafluoroeth (GEBAUERS) AERO Inpatient Standard   pregabalin (LYRICA) 100 MG capsule Discontinued by provider   pregabalin (LYRICA) 50 MG capsule Discontinued by provider   sennosides-docusate sodium (SENOKOT-S) 8.6-50 MG tablet Change in therapy    Social History:  reports that she quit smoking about 39 years ago. Her smoking use included cigarettes. She started smoking about 59 years ago. She has a 20 pack-year smoking history. She has never used smokeless tobacco. She reports that she does not drink alcohol and does not use drugs.  Family History:   Family History  Problem Relation Age of Onset   Diabetes Mother    Peripheral vascular disease Mother        amputation   CAD Father    Heart disease Father     Hypertension Father    Heart attack Father    CAD Sister    Cancer Brother        x 2 brothers, type unknown   Breast cancer Sister     There were no vitals taken for this visit. General: NAD, laying in bed  Head: Normocephalic, atraumatic. Moist oral mucosal membranes  Eyes: Anicteric  Lungs:  Clear to auscultation, normal effort  Heart: Regular rate and rhythm  Abdomen:  Soft, nontender  Extremities: no peripheral edema.  Neurologic: Alert and oriented, moving all four extremities  Skin: No lesions  Access: Lt AVF   GEN: NAD, A&Ox3, NCAT HEENT: No conjunctival pallor, EOMI NECK: Supple, no thyromegaly LUNGS: CTA B/L no rales, rhonchi or wheezing CV: RRR, No M/R/G ABD: SNDNT +BS  EXT: No lower extremity edema ACCESS: Left brachiocephalic fistula pulsatile to the outflow      Nickie Deren, Len Blalock, MD 11/12/2023, 8:38 AM

## 2024-01-23 ENCOUNTER — Ambulatory Visit: Payer: Medicare Other | Admitting: Podiatry

## 2024-01-23 ENCOUNTER — Encounter: Payer: Self-pay | Admitting: Podiatry

## 2024-01-23 DIAGNOSIS — M79675 Pain in left toe(s): Secondary | ICD-10-CM | POA: Diagnosis not present

## 2024-01-23 DIAGNOSIS — E1149 Type 2 diabetes mellitus with other diabetic neurological complication: Secondary | ICD-10-CM

## 2024-01-23 DIAGNOSIS — M79674 Pain in right toe(s): Secondary | ICD-10-CM | POA: Diagnosis not present

## 2024-01-23 DIAGNOSIS — B351 Tinea unguium: Secondary | ICD-10-CM | POA: Diagnosis not present

## 2024-01-23 DIAGNOSIS — E1140 Type 2 diabetes mellitus with diabetic neuropathy, unspecified: Secondary | ICD-10-CM

## 2024-01-24 NOTE — Progress Notes (Signed)
 Subjective:   Patient ID: Jacqueline [name], female   DOB: 78 y.o.   MRN: [id number]   HPI Patient presents very poor health with caregiver with nail disease 1-5 both feet discoloration of the toes long-term history of kidney dialysis and burning shooting pain in both feet   ROS      Objective:  Physical Exam  Vascular status is diminished bilateral with diminished PT DP pulses with the patient noted to have diminishment of sharp dull vibratory long-term history of dialysis thick yellow brittle nailbeds that can become painful with generalized foot pain and inability to take gabapentin or Lyrica     Assessment:  2 separate conditions with 1 being chronic neuropathy diabetic also with consideration of vascular issues with 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  There is no breakdown in skin so I do not recommend at this time vascular as I do not think there is anything that they can do given her age and physical condition.  If any breakdown were to occur she is to reappoint immediately but at this point I did also discussed her neuropathy do not see treatment that would be of benefit and I did go ahead today and I debrided nailbeds 1-5 both feet no iatrogenic bleeding

## 2024-01-27 ENCOUNTER — Emergency Department
Admission: EM | Admit: 2024-01-27 | Discharge: 2024-01-27 | Disposition: A | Discharge status: Home / Self Care | Payer: Medicare Other | Attending: Emergency Medicine | Admitting: Emergency Medicine

## 2024-01-27 ENCOUNTER — Other Ambulatory Visit: Payer: Self-pay

## 2024-01-27 DIAGNOSIS — Z7901 Long term (current) use of anticoagulants: Secondary | ICD-10-CM | POA: Diagnosis not present

## 2024-01-27 DIAGNOSIS — I953 Hypotension of hemodialysis: Secondary | ICD-10-CM | POA: Insufficient documentation

## 2024-01-27 DIAGNOSIS — I2510 Atherosclerotic heart disease of native coronary artery without angina pectoris: Secondary | ICD-10-CM | POA: Diagnosis not present

## 2024-01-27 DIAGNOSIS — Z992 Dependence on renal dialysis: Secondary | ICD-10-CM | POA: Diagnosis not present

## 2024-01-27 DIAGNOSIS — E119 Type 2 diabetes mellitus without complications: Secondary | ICD-10-CM | POA: Diagnosis not present

## 2024-01-27 DIAGNOSIS — I5030 Unspecified diastolic (congestive) heart failure: Secondary | ICD-10-CM | POA: Insufficient documentation

## 2024-01-27 DIAGNOSIS — N186 End stage renal disease: Secondary | ICD-10-CM | POA: Insufficient documentation

## 2024-01-27 DIAGNOSIS — I959 Hypotension, unspecified: Secondary | ICD-10-CM | POA: Diagnosis present

## 2024-01-27 LAB — CBC WITH DIFFERENTIAL/PLATELET
Abs Immature Granulocytes: 0.05 10*3/uL (ref 0.00–0.07)
Basophils Absolute: 0.1 10*3/uL (ref 0.0–0.1)
Basophils Relative: 1 %
Eosinophils Absolute: 0.1 10*3/uL (ref 0.0–0.5)
Eosinophils Relative: 1 %
HCT: 41.2 % (ref 36.0–46.0)
Hemoglobin: 12.8 g/dL (ref 12.0–15.0)
Immature Granulocytes: 1 %
Lymphocytes Relative: 12 %
Lymphs Abs: 1.3 10*3/uL (ref 0.7–4.0)
MCH: 32.8 pg (ref 26.0–34.0)
MCHC: 31.1 g/dL (ref 30.0–36.0)
MCV: 105.6 fL — ABNORMAL HIGH (ref 80.0–100.0)
Monocytes Absolute: 0.9 10*3/uL (ref 0.1–1.0)
Monocytes Relative: 9 %
Neutro Abs: 8.4 10*3/uL — ABNORMAL HIGH (ref 1.7–7.7)
Neutrophils Relative %: 76 %
Platelets: 172 10*3/uL (ref 150–400)
RBC: 3.9 MIL/uL (ref 3.87–5.11)
RDW: 15.4 % (ref 11.5–15.5)
WBC: 10.8 10*3/uL — ABNORMAL HIGH (ref 4.0–10.5)
nRBC: 0 % (ref 0.0–0.2)

## 2024-01-27 LAB — BASIC METABOLIC PANEL
Anion gap: 18 — ABNORMAL HIGH (ref 5–15)
BUN: 30 mg/dL — ABNORMAL HIGH (ref 8–23)
CO2: 28 mmol/L (ref 22–32)
Calcium: 8.2 mg/dL — ABNORMAL LOW (ref 8.9–10.3)
Chloride: 90 mmol/L — ABNORMAL LOW (ref 98–111)
Creatinine, Ser: 5.47 mg/dL — ABNORMAL HIGH (ref 0.44–1.00)
GFR, Estimated: 7 mL/min — ABNORMAL LOW (ref >60)
Glucose, Bld: 145 mg/dL — ABNORMAL HIGH (ref 70–99)
Potassium: 3.3 mmol/L — ABNORMAL LOW (ref 3.5–5.1)
Sodium: 136 mmol/L (ref 135–145)

## 2024-01-27 MED ORDER — DROXIDOPA 100 MG PO CAPS
200.0000 mg | ORAL_CAPSULE | Freq: Once | ORAL | Status: AC
  Administered 2024-01-27: 200 mg via ORAL
  Filled 2024-01-27: qty 2

## 2024-01-27 MED ORDER — DROXIDOPA 200 MG PO CAPS: 200.0000 mg | ORAL_CAPSULE | Freq: Three times a day (TID) | ORAL | 0 refills | Status: DC

## 2024-01-27 MED ORDER — ACETAMINOPHEN 325 MG PO TABS
650.0000 mg | ORAL_TABLET | Freq: Once | ORAL | Status: AC
  Administered 2024-01-27: 650 mg via ORAL
  Filled 2024-01-27: qty 2

## 2024-01-27 NOTE — ED Notes (Signed)
 Pt given graham crackers.

## 2024-01-27 NOTE — ED Provider Notes (Signed)
 [facility] Provider Note    Event Date/Time   First MD Initiated Contact with Patient 01/27/24 1128     (approximate)   History   Hypotension   HPI Jacqueline [name] is a 78 y.o. female with history of ESRD on dialysis, CAD, DM2, chronic Eliquis use, HLD, HFpEF presenting today for hypotension.  Patient was reportedly at dialysis today when approximately 2 hours into her 3-hour session, she started to develop hypotension.  Patient states she is typically on midodrine and droxidopa, however she did not get the second medication.  She felt lightheaded during the episode of hypotension.  She also had mild nausea which resolved with Zofran with EMS.  On arrival here she feels back to her baseline and her blood pressure is back to normal.  Denied any symptoms prior to dialysis.  No recent chest pain, shortness of breath, cough, congestion, diarrhea, constipation.  Patient had similar episode with ED visit on 09/30/2023 which was reviewed.     Physical Exam   Triage Vital Signs: ED Triage Vitals  Encounter Vitals Group     BP      Systolic BP Percentile      Diastolic BP Percentile      Pulse      Resp      Temp      Temp src      SpO2      Weight      Height      Head Circumference      [facility]      Pain Score      Pain Loc      Pain Education      Exclude from Growth Chart     Most recent vital signs: Vitals:   01/27/24 1133 01/27/24 1200  BP: (!) 110/54 117/61  Pulse:    Resp:  16  Temp:    SpO2:  96%    I have reviewed the vital signs. General:  Awake, alert, no acute distress. Head:  Normocephalic, Atraumatic. EENT:  PERRL, EOMI, Oral mucosa pink and moist, Neck is supple. Cardiovascular: Regular rate, 2+ distal pulses. Respiratory:  Normal respiratory effort, symmetrical expansion, no distress.   Extremities:  Moving all four extremities through full ROM without pain.   Neuro:  Alert and oriented.  Interacting appropriately.    Skin:  Warm, dry, no rash.  Fistula present in left upper extremity with bandage in place. Psych: Appropriate affect.     ED Results / Procedures / Treatments   Labs (all labs ordered are listed, but only abnormal results are displayed) Labs Reviewed  CBC WITH DIFFERENTIAL/PLATELET - Abnormal; Notable for the following components:      Result Value   WBC 10.8 (*)    MCV 105.6 (*)    Neutro Abs 8.4 (*)    All other components within normal limits  BASIC METABOLIC PANEL - Abnormal; Notable for the following components:   Potassium 3.3 (*)    Chloride 90 (*)    Glucose, Bld 145 (*)    BUN 30 (*)    Creatinine, Ser 5.47 (*)    Calcium 8.2 (*)    GFR, Estimated 7 (*)    Anion gap 18 (*)    All other components within normal limits     EKG My EKG interpretation: Rate of 90.  Normal sinus rhythm.  Normal axis, normal intervals.  No acute ST elevations or depressions   RADIOLOGY  PROCEDURES:  Critical Care performed: No  Procedures   MEDICATIONS ORDERED IN ED: Medications  droxidopa (NORTHERA) capsule 200 mg (200 mg Oral Given 01/27/24 1227)     IMPRESSION / MDM / ASSESSMENT AND PLAN / ED COURSE  I reviewed the triage vital signs and the nursing notes.                              Differential diagnosis includes, but is not limited to, dehydration, electrolyte abnormality, dialysis induced hypotension  Patient's presentation is most consistent with acute complicated illness / injury requiring diagnostic workup.  Patient is an 78 year old female presenting today for hypotension during her dialysis session.  1 reported episode of hypotension but on arrival with EMS was no longer hypotensive.  She had mild nausea which was resolved with 1 dose of Zofran.  Patient otherwise symptomatic care at this time.  Denies any chest pain or shortness of breath with me.  Received her midodrine but did not receive her droxidopa which she typically takes for dialysis due to an  issue getting it at [facility].  She was given a dose of this.  Patient was monitored for 2 hours in the ED with no recurrence of symptoms.  Suspect related to fluid shifts during hemodialysis. Blood pressure remained stable.  Tolerated p.o. without issue.  Will discharge with reviewed prescription for medications. Given strict return precautions and told to follow up with PCP.  The patient is on the cardiac monitor to evaluate for evidence of arrhythmia and/or significant heart rate changes. Clinical Course as of 01/27/24 1324  Tue Jan 27, 2024  1324 Patient feeling better at this time.  Vital signs stable.  Tolerating p.o. without issue.  Will discharge. [DW]    Clinical Course User Index [DW] Janith Lima, MD     FINAL CLINICAL IMPRESSION(S) / ED DIAGNOSES   Final diagnoses:  Hemodialysis-associated hypotension     Rx / DC Orders   ED Discharge Orders          Ordered    Droxidopa 200 MG CAPS  3 times daily with meals        01/27/24 1324             Note:  This document was prepared using Dragon voice recognition software and may include unintentional dictation errors.   Janith Lima, MD 01/27/24 [phone]

## 2024-01-27 NOTE — ED Notes (Signed)
 [facility]  CALLED  FOR  TRANSPORT  TO  [facility]

## 2024-01-27 NOTE — Discharge Instructions (Signed)
Please continue taking your medications as prescribed.

## 2024-01-27 NOTE — ED Triage Notes (Signed)
 Pt to ED [facility] from dialysis for hypotension, had one hour left in dialysis. From [facility].  Reports was out of pill that she usually gets to keep bp up Dialysis reports bp 70/40. Ems bp 113/48 +nausea.  Received 1L bolus PTA Wears 2 L [place] chronic Reports intermittent chest pain today before getting nausea

## 2024-06-20 ENCOUNTER — Emergency Department

## 2024-06-20 ENCOUNTER — Inpatient Hospital Stay
Admission: EM | Admit: 2024-06-20 | Discharge: 2024-06-25 | DRG: 640 | Disposition: A | Discharge status: Skilled Nursing Facility | Source: Skilled Nursing Facility | Attending: Internal Medicine | Admitting: Internal Medicine

## 2024-06-20 ENCOUNTER — Other Ambulatory Visit: Payer: Self-pay

## 2024-06-20 DIAGNOSIS — K5900 Constipation, unspecified: Secondary | ICD-10-CM | POA: Diagnosis present

## 2024-06-20 DIAGNOSIS — R519 Headache, unspecified: Secondary | ICD-10-CM

## 2024-06-20 DIAGNOSIS — Z992 Dependence on renal dialysis: Secondary | ICD-10-CM

## 2024-06-20 DIAGNOSIS — I358 Other nonrheumatic aortic valve disorders: Secondary | ICD-10-CM | POA: Diagnosis present

## 2024-06-20 DIAGNOSIS — Z87891 Personal history of nicotine dependence: Secondary | ICD-10-CM

## 2024-06-20 DIAGNOSIS — E1122 Type 2 diabetes mellitus with diabetic chronic kidney disease: Secondary | ICD-10-CM | POA: Diagnosis present

## 2024-06-20 DIAGNOSIS — Z6837 Body mass index (BMI) 37.0-37.9, adult: Secondary | ICD-10-CM

## 2024-06-20 DIAGNOSIS — E785 Hyperlipidemia, unspecified: Secondary | ICD-10-CM | POA: Diagnosis present

## 2024-06-20 DIAGNOSIS — Z90710 Acquired absence of both cervix and uterus: Secondary | ICD-10-CM

## 2024-06-20 DIAGNOSIS — I2510 Atherosclerotic heart disease of native coronary artery without angina pectoris: Secondary | ICD-10-CM | POA: Diagnosis present

## 2024-06-20 DIAGNOSIS — Z803 Family history of malignant neoplasm of breast: Secondary | ICD-10-CM

## 2024-06-20 DIAGNOSIS — J9809 Other diseases of bronchus, not elsewhere classified: Secondary | ICD-10-CM

## 2024-06-20 DIAGNOSIS — Z7901 Long term (current) use of anticoagulants: Secondary | ICD-10-CM

## 2024-06-20 DIAGNOSIS — J441 Chronic obstructive pulmonary disease with (acute) exacerbation: Secondary | ICD-10-CM | POA: Diagnosis not present

## 2024-06-20 DIAGNOSIS — I5032 Chronic diastolic (congestive) heart failure: Secondary | ICD-10-CM | POA: Diagnosis present

## 2024-06-20 DIAGNOSIS — N186 End stage renal disease: Secondary | ICD-10-CM | POA: Diagnosis present

## 2024-06-20 DIAGNOSIS — D631 Anemia in chronic kidney disease: Secondary | ICD-10-CM | POA: Diagnosis present

## 2024-06-20 DIAGNOSIS — Z9981 Dependence on supplemental oxygen: Secondary | ICD-10-CM

## 2024-06-20 DIAGNOSIS — W19XXXA Unspecified fall, initial encounter: Secondary | ICD-10-CM | POA: Diagnosis present

## 2024-06-20 DIAGNOSIS — J209 Acute bronchitis, unspecified: Secondary | ICD-10-CM | POA: Diagnosis present

## 2024-06-20 DIAGNOSIS — E1151 Type 2 diabetes mellitus with diabetic peripheral angiopathy without gangrene: Secondary | ICD-10-CM | POA: Diagnosis present

## 2024-06-20 DIAGNOSIS — Z8249 Family history of ischemic heart disease and other diseases of the circulatory system: Secondary | ICD-10-CM

## 2024-06-20 DIAGNOSIS — R0902 Hypoxemia: Secondary | ICD-10-CM | POA: Diagnosis present

## 2024-06-20 DIAGNOSIS — Z882 Allergy status to sulfonamides status: Secondary | ICD-10-CM

## 2024-06-20 DIAGNOSIS — Z833 Family history of diabetes mellitus: Secondary | ICD-10-CM

## 2024-06-20 DIAGNOSIS — I132 Hypertensive heart and chronic kidney disease with heart failure and with stage 5 chronic kidney disease, or end stage renal disease: Secondary | ICD-10-CM | POA: Diagnosis present

## 2024-06-20 DIAGNOSIS — I252 Old myocardial infarction: Secondary | ICD-10-CM

## 2024-06-20 DIAGNOSIS — Z955 Presence of coronary angioplasty implant and graft: Secondary | ICD-10-CM

## 2024-06-20 DIAGNOSIS — S2241XA Multiple fractures of ribs, right side, initial encounter for closed fracture: Secondary | ICD-10-CM | POA: Diagnosis present

## 2024-06-20 DIAGNOSIS — E875 Hyperkalemia: Secondary | ICD-10-CM | POA: Diagnosis not present

## 2024-06-20 DIAGNOSIS — I4819 Other persistent atrial fibrillation: Secondary | ICD-10-CM | POA: Diagnosis present

## 2024-06-20 DIAGNOSIS — I482 Chronic atrial fibrillation, unspecified: Secondary | ICD-10-CM

## 2024-06-20 DIAGNOSIS — Z794 Long term (current) use of insulin: Secondary | ICD-10-CM

## 2024-06-20 DIAGNOSIS — E66812 Obesity, class 2: Secondary | ICD-10-CM | POA: Diagnosis present

## 2024-06-20 DIAGNOSIS — N2581 Secondary hyperparathyroidism of renal origin: Secondary | ICD-10-CM | POA: Diagnosis present

## 2024-06-20 DIAGNOSIS — R1010 Upper abdominal pain, unspecified: Secondary | ICD-10-CM

## 2024-06-20 DIAGNOSIS — E1143 Type 2 diabetes mellitus with diabetic autonomic (poly)neuropathy: Secondary | ICD-10-CM | POA: Diagnosis present

## 2024-06-20 DIAGNOSIS — Z7952 Long term (current) use of systemic steroids: Secondary | ICD-10-CM

## 2024-06-20 DIAGNOSIS — E669 Obesity, unspecified: Secondary | ICD-10-CM

## 2024-06-20 DIAGNOSIS — I071 Rheumatic tricuspid insufficiency: Secondary | ICD-10-CM | POA: Diagnosis present

## 2024-06-20 LAB — COMPREHENSIVE METABOLIC PANEL WITH GFR
ALT: 17 U/L (ref 0–44)
AST: 27 U/L (ref 15–41)
Albumin: 3.5 g/dL (ref 3.5–5.0)
Alkaline Phosphatase: 143 U/L — ABNORMAL HIGH (ref 38–126)
Anion gap: 14 (ref 5–15)
BUN: 46 mg/dL — ABNORMAL HIGH (ref 8–23)
CO2: 28 mmol/L (ref 22–32)
Calcium: 8.1 mg/dL — ABNORMAL LOW (ref 8.9–10.3)
Chloride: 94 mmol/L — ABNORMAL LOW (ref 98–111)
Creatinine, Ser: 5.98 mg/dL — ABNORMAL HIGH (ref 0.44–1.00)
GFR, Estimated: 7 mL/min — ABNORMAL LOW (ref >60)
Glucose, Bld: 136 mg/dL — ABNORMAL HIGH (ref 70–99)
Potassium: 4.7 mmol/L (ref 3.5–5.1)
Sodium: 136 mmol/L (ref 135–145)
Total Bilirubin: 0.8 mg/dL (ref 0.0–1.2)
Total Protein: 7.7 g/dL (ref 6.5–8.1)

## 2024-06-20 LAB — LIPASE, BLOOD: Lipase: 50 U/L (ref 11–51)

## 2024-06-20 LAB — CBC
HCT: 39.7 % (ref 36.0–46.0)
Hemoglobin: 12 g/dL (ref 12.0–15.0)
MCH: 32 pg (ref 26.0–34.0)
MCHC: 30.2 g/dL (ref 30.0–36.0)
MCV: 105.9 fL — ABNORMAL HIGH (ref 80.0–100.0)
Platelets: 188 K/uL (ref 150–400)
RBC: 3.75 MIL/uL — ABNORMAL LOW (ref 3.87–5.11)
RDW: 15.1 % (ref 11.5–15.5)
WBC: 11 K/uL — ABNORMAL HIGH (ref 4.0–10.5)
nRBC: 0 % (ref 0.0–0.2)

## 2024-06-20 MED ORDER — FENTANYL CITRATE PF 50 MCG/ML IJ SOSY
50.0000 ug | PREFILLED_SYRINGE | Freq: Once | INTRAMUSCULAR | Status: AC
  Administered 2024-06-20: 50 ug via INTRAVENOUS
  Filled 2024-06-20: qty 1

## 2024-06-20 MED ORDER — IPRATROPIUM-ALBUTEROL 0.5-2.5 (3) MG/3ML IN SOLN
3.0000 mL | Freq: Once | RESPIRATORY_TRACT | Status: AC
  Administered 2024-06-20: 3 mL via RESPIRATORY_TRACT
  Filled 2024-06-20: qty 3

## 2024-06-20 MED ORDER — ONDANSETRON HCL 4 MG/2ML IJ SOLN
4.0000 mg | Freq: Once | INTRAMUSCULAR | Status: AC
  Administered 2024-06-20: 4 mg via INTRAVENOUS
  Filled 2024-06-20: qty 2

## 2024-06-20 NOTE — ED Provider Notes (Incomplete)
 [facility] Provider Note    Event Date/Time   First MD Initiated Contact with Patient 06/20/24 2306     (approximate)   History   Abdominal Pain   HPI  Jacqueline [name] is a 78 y.o. female with history of pulmonary hypertension on oxygen  chronically, COPD (per patient), coronary artery disease, end-stage renal disease on hemodialysis Tuesday, Thursday and Saturday, hypertension, hyperlipidemia, diabetes, atrial fibrillation on Eliquis  who presents to the emergency department multiple complaints.  Patient complains of feeling short of breath and having discomfort with deep inspiration.  Pain is in the right side of her chest, right upper abdomen.  She reports productive cough.  No fevers.  She denies nausea, vomiting or diarrhea.  She no longer makes urine.  Denies prior abdominal surgery.  Also complaining of generalized headache.  No head injury.  No numbness, tingling or focal weakness.  Also complains of pain to the left thoracic back that has been ongoing for months.  Patient coming from [facility].  History provided by patient, son.    Past Medical History:  Diagnosis Date   Arthritis    left knee (12/04/2016)   CAD (coronary artery disease)    a. 12/2016 NSTEMI/PCI: LM nl, LaD 20p/d, D1 20, OM2 20, OM3 20, RCA 67m (3.0x18 Resolute DES). EF 65%.   Chronic lower back pain    Diverticulosis    on CT   ESRD on dialysis [facility])    GERD (gastroesophageal reflux disease)    Hiatal hernia    History of echocardiogram    a. 09/2022 Echo: EF 60-65%, mild asymm LVH, mod reduced RV fxn, RVSP . Mildly dil RA. Mild AI. Ao sclerosis. Mild MR.   Hyperlipidemia    Hypertension    a. 12/2016 labetalol  d/c'd 2/2 hypotension in HD.   Morbid obesity (HCC)    Obesity    Persistent atrial fibrillation (HCC)    a. 05/2023 Zio: 100% afib avg HR 52 (32-99).   Pneumonia    couple times (12/04/2016)   Symptomatic bradycardia 06/07/2023   Type II  diabetes mellitus [facility])     Past Surgical History:  Procedure Laterality Date   A/V FISTULAGRAM N/A 11/12/2023   Procedure: A/V Fistulagram;  Surgeon: Melia Lynwood ORN, MD;  Location: [facility] INVASIVE CV LAB;  Service: Cardiovascular;  Laterality: N/A;   ABDOMINAL HYSTERECTOMY     AV FISTULA PLACEMENT Left 01/27/2014   Procedure: ARTERIOVENOUS (AV) FISTULA CREATION;  Surgeon: Lynwood JONETTA Collum, MD;  Location: [facility] OR;  Service: Vascular;  Laterality: Left;   AV FISTULA REPAIR     had it cleaned out   CARDIAC CATHETERIZATION  ?1980s   CARDIAC CATHETERIZATION N/A 12/04/2016   Procedure: Left Heart Cath Angiography;  Surgeon: Lonni JONETTA Cash, MD;  Location: [facility] INVASIVE CV LAB;  Service: Cardiovascular;  Laterality: N/A;   CARDIAC CATHETERIZATION N/A 12/04/2016   Procedure: Coronary Stent Intervention;  Surgeon: Lonni JONETTA Cash, MD;  Location: [facility] INVASIVE CV LAB;  Service: Cardiovascular;  Laterality: N/A;  Mid RCA   CATARACT EXTRACTION W/ INTRAOCULAR LENS IMPLANT Left    COLONOSCOPY W/ BIOPSIES AND POLYPECTOMY     CORONARY ANGIOPLASTY WITH STENT PLACEMENT  12/04/2016   ESOPHAGOGASTRODUODENOSCOPY     INSERTION OF DIALYSIS CATHETER N/A 01/19/2014   Procedure: INSERTION OF DIALYSIS CATHETER;  Surgeon: Krystal JULIANNA Doing, MD;  Location: [facility] OR;  Service: Vascular;  Laterality: N/A;   PERIPHERAL VASCULAR BALLOON ANGIOPLASTY  11/12/2023   Procedure: PERIPHERAL VASCULAR BALLOON ANGIOPLASTY;  Surgeon: Melia Lynwood ORN, MD;  Location: [facility] INVASIVE CV LAB;  Service: Cardiovascular;;   RIGHT/LEFT HEART CATH AND CORONARY ANGIOGRAPHY N/A 06/11/2023   Procedure: RIGHT/LEFT HEART CATH AND CORONARY ANGIOGRAPHY;  Surgeon: Mady Bruckner, MD;  Location: [facility] INVASIVE CV LAB;  Service: Cardiovascular;  Laterality: N/A;    MEDICATIONS:  Prior to Admission medications   Medication Sig Start Date End Date Taking? Authorizing Provider  acetaminophen  (TYLENOL ) 650 MG CR tablet Take 650 mg by mouth every 8 (eight) hours.  (0600, 1400 & 2200)    [provider]  albuterol  (PROVENTIL ) (2.5 MG/3ML) 0.083% nebulizer solution Take 2.5 mg by nebulization every 6 (six) hours as needed for wheezing or shortness of breath.    [provider]  allopurinol  (ZYLOPRIM ) 100 MG tablet Take 100 mg by mouth daily.    [provider]  aluminum -magnesium  hydroxide-simethicone  (MAALOX) 200-200-20 MG/5ML SUSP Take 30 mLs by mouth 4 (four) times daily -  before meals and at bedtime. Patient taking differently: Take 15 mLs by mouth every 4 (four) hours as needed (heartburn.). 07/20/23   Viviann Pastor, MD  apixaban  (ELIQUIS ) 5 MG TABS tablet Take 1 tablet (5 mg total) by mouth 2 (two) times daily. 07/01/23   Fausto Burnard LABOR, DO  bisacodyl  (DULCOLAX) 10 MG suppository Place 10 mg rectally daily as needed for moderate constipation.    [provider]  cinacalcet  (SENSIPAR ) 30 MG tablet Take 30 mg by mouth daily with supper. (1800)    [provider]  clotrimazole-betamethasone (LOTRISONE) cream Apply 1 Application topically See admin instructions. Apply as needed to affected area of ear for itching/irritation.    [provider]  dextromethorphan -guaiFENesin  (MUCINEX  DM) 30-600 MG 12hr tablet Take 1 tablet by mouth 2 (two) times daily. (0900 & 1800)    [provider]  Droxidopa  200 MG CAPS Take 200 mg by mouth with breakfast, with lunch, and with evening meal. 01/27/24   Malvina Alm DASEN, MD  ezetimibe  (ZETIA ) 10 MG tablet Take 1 tablet (10 mg total) by mouth daily. Patient taking differently: Take 10 mg by mouth every evening. (1800) 04/30/22   Darron Deatrice LABOR, MD  ferric citrate  (AURYXIA ) 1 GM 210 MG(Fe) tablet Take 420 mg by mouth 3 (three) times daily with meals.    [provider]  fexofenadine (ALLEGRA) 180 MG tablet Take 180 mg by mouth in the morning. (0800)    [provider]  fludrocortisone  (FLORINEF ) 0.1 MG tablet Take 2 tablets (0.2 mg total) by  mouth 2 (two) times daily. 07/01/23   Fausto Burnard LABOR, DO  fluticasone (FLONASE) 50 MCG/ACT nasal spray Place 1 spray into both nostrils in the morning. (0900)    [provider]  guaiFENesin -dextromethorphan  (ROBITUSSIN DM) 100-10 MG/5ML syrup Take 15 mLs by mouth every 4 (four) hours as needed for cough. 07/01/23   Fausto Burnard LABOR, DO  HYDROcodone -acetaminophen  (NORCO/VICODIN) 5-325 MG tablet Take 1-2 tablets by mouth every 6 (six) hours as needed for moderate pain. 07/01/23   Fausto Burnard LABOR, DO  insulin  aspart (NOVOLOG ) 100 UNIT/ML injection Inject 0-20 Units into the skin 3 (three) times daily with meals. For CBG 70-120: 0 units For CBG 121-150: 3 units For CBG 151-200: 4 units For CBG 201-250: 7 units For CBG 251-300: 11 units For CBG 301-350: 15 units For CBG 351-400: 20 units For CBG > 400: 25 units and call your doctor 07/01/23   Fausto Burnard A, DO  insulin  glargine-yfgn (SEMGLEE ) 100 UNIT/ML injection Inject 0.05  mLs (5 Units total) into the skin daily. 07/02/23   Fausto Sor A, DO  Lidocaine  HCl (ASPERCREME LIDOCAINE ) 4 % CREA Apply 1 Application topically in the morning and at bedtime. Applied to bilateral knees    [provider]  magnesium  hydroxide (MILK OF MAGNESIA) 400 MG/5ML suspension Take 30 mLs by mouth daily as needed (if no BM in 3 days.).    [provider]  Menthol -Zinc Oxide (MOISTURE BARRIER EX) Apply 1 Application topically as directed. Apply barrier cream to buttocks every shift    [provider]  midodrine  (PROAMATINE ) 10 MG tablet Take 1 tablet (10 mg total) by mouth 3 (three) times daily with meals. 06/09/23   Shellia Inge BIRCH, NP  midodrine  (PROAMATINE ) 10 MG tablet Take 20 mg by mouth as needed (for systolic BP less than 100.).    [provider]  Multiple Vitamin (MULTIVITAMIN WITH MINERALS) TABS tablet Take 1 tablet by mouth at bedtime.    [provider]  naloxone [facility]) nasal spray 4 mg/0.1 mL  Place 1 spray into the nose 3 (three) times daily as needed (signs and symptoms of opioid overdose).    [provider]  nitroGLYCERIN  (NITROSTAT ) 0.4 MG SL tablet Place 1 tablet (0.4 mg total) under the tongue every 5 (five) minutes as needed for chest pain. 07/01/23   Fausto Sor A, DO  ondansetron  (ZOFRAN -ODT) 4 MG disintegrating tablet Take 4 mg by mouth every 8 (eight) hours as needed for nausea or vomiting.    [provider]  OXYGEN  Inhale 2 L/min into the lungs as needed (shortness of breath or decreased O2).    [provider]  pantoprazole  (PROTONIX ) 40 MG tablet Take 1 tablet (40 mg total) by mouth daily. 07/02/23   Fausto Sor LABOR, DO  polyethylene glycol (MIRALAX  / GLYCOLAX ) 17 g packet Take 17 g by mouth daily as needed for moderate constipation. 06/09/23   Keene, Jeremiah D, NP  polyvinyl alcohol  (LIQUIFILM TEARS) 1.4 % ophthalmic solution Place 1 drop into both eyes as needed for dry eyes. Patient taking differently: Place 1 drop into both eyes every 4 (four) hours as needed for dry eyes. 07/01/23   Fausto Sor LABOR, DO  rosuvastatin  (CRESTOR ) 10 MG tablet Take 1 tablet (10 mg total) by mouth daily. Patient taking differently: Take 10 mg by mouth every evening. 10/03/22   Jhonny Calvin NOVAK, MD  sodium phosphate (FLEET) ENEM Place 1 enema rectally daily as needed for severe constipation (if no BM results in 12 hours from dulcolax suppository).    [provider]  sorbitol 70 % solution Take 30 mLs by mouth daily as needed (constipation.).    [provider]    Physical Exam   Triage Vital Signs: ED Triage Vitals [06/20/24 2133]  Encounter Vitals Group     BP 129/64     Girls Systolic BP Percentile      Girls Diastolic BP Percentile      Boys Systolic BP Percentile      Boys Diastolic BP Percentile      Pulse Rate 72     Resp 15     Temp 97.9 F (36.6 C)     Temp Source Oral     SpO2 100 %     Weight      Height      Head  Circumference      [facility]      Pain Score 8     Pain Loc  Pain Education      Exclude from Growth Chart     Most recent vital signs: Vitals:   06/21/24 0300 06/21/24 0330  BP: (!) 122/49 (!) 123/56  Pulse: 97 99  Resp:  (!) 24  Temp:    SpO2: 94% 96%    CONSTITUTIONAL: Alert, responds appropriately to questions.  Chronically ill-appearing; well-nourished, elderly HEAD: Normocephalic, atraumatic EYES: Conjunctivae clear, pupils appear equal, sclera nonicteric ENT: normal nose; moist mucous membranes NECK: Supple, normal ROM CARD: Irregular and rate controlled; S1 and S2 appreciated; tender over the right lateral chest wall without crepitus, ecchymosis or deformity RESP: Diminished aeration at bases bilaterally with diffuse expiratory wheezing, no rhonchi or rales, chronically on oxygen  ABD/GI: Non-distended; soft, tender throughout the upper abdomen and right upper quadrant, no guarding or rebound, no tenderness at McBurney's point BACK: The back appears normal EXT: Normal ROM in all joints; no deformity noted, no edema, no calf tenderness or calf swelling SKIN: Normal color for age and race; warm; no rash on exposed skin; scarring noted to the left thoracic region that is tender to palpation without open wounds, vesicles, blisters, redness or increased warmth.  No induration or fluctuance. NEURO: Moves all extremities equally, normal speech, no facial asymmetry, normal sensation PSYCH: The patient's mood and manner are appropriate.   ED Results / Procedures / Treatments   LABS: (all labs ordered are listed, but only abnormal results are displayed) Labs Reviewed  COMPREHENSIVE METABOLIC PANEL WITH GFR - Abnormal; Notable for the following components:      Result Value   Chloride 94 (*)    Glucose, Bld 136 (*)    BUN 46 (*)    Creatinine, Ser 5.98 (*)    Calcium  8.1 (*)    Alkaline Phosphatase 143 (*)    GFR, Estimated 7 (*)    All other components within normal  limits  CBC - Abnormal; Notable for the following components:   WBC 11.0 (*)    RBC 3.75 (*)    MCV 105.9 (*)    All other components within normal limits  CBG MONITORING, ED - Abnormal; Notable for the following components:   Glucose-Capillary 152 (*)    All other components within normal limits  LIPASE, BLOOD     EKG:  EKG Interpretation Date/Time:  Monday June 21 2024 03:31:08 EDT Ventricular Rate:  95 PR Interval:    QRS Duration:  102 QT Interval:  393 QTC Calculation: 495 R Axis:   137  Text Interpretation: Atrial fibrillation Low voltage, extremity and precordial leads Probable right ventricular hypertrophy Lateral infarct, old No significant change since last tracing Confirmed by Aleynah Rocchio, Josette [phone]) on 06/21/2024 4:08:29 AM         RADIOLOGY: My personal review and interpretation of imaging: CTA chest shows no PE.  CT abdomen pelvis unremarkable.  Right upper quadrant ultrasound shows no cholecystitis.  CT head negative.  I have personally reviewed all radiology reports.   CT Angio Chest PE W and/or Wo Contrast Addendum Date: 06/21/2024 ** ADDENDUM #1 ** ADDENDUM: Also noted are nondisplaced right posterolateral 7th and 8th rib fractures. No pneumothorax. ---------------------------------------------------- Electronically signed by: Pinkie Pebbles MD 06/21/2024 01:10 AM EDT RP Workstation: [id number]   Result Date: 06/21/2024 ** ORIGINAL REPORT ** EXAM: CTA CHEST PE WITH CONTRAST CT ABDOMEN AND PELVIS WITH CONTRAST 06/21/2024 12:49:21 AM TECHNIQUE: CTA of the chest was performed after the administration of intravenous contrast. Multiplanar reformatted images are provided for review. MIP images are provided for  review. CT of the abdomen and pelvis was performed with the administration of intravenous contrast. Automated exposure control, iterative reconstruction, and/or weight based adjustment of the mA/kV was utilized to reduce the radiation dose to as low as reasonably  achievable. COMPARISON: CT chest dated 08/09/2023 and CT abdomen/pelvis dated 06/07/2023. CLINICAL HISTORY: Pulmonary embolism (PE) suspected, high prob. Pt reports that she has right sided abdominal pain x 1 year and left shoulder pain for a long time and vaginal discharge x 1 year, as well as intermittent headaches x 2 years. When asked why she had staff call EMS tonight, she says because she wants to find out what's going on and because she had a bad episode of abdominal pain last night. Pt is alert and oriented x 4. FINDINGS: CHEST: PULMONARY ARTERIES: Pulmonary arteries are adequately opacified for evaluation. No evidence of pulmonary embolism. MEDIASTINUM: Cardiomegaly with left cardiomediastinal shift. Moderate 3-vessel coronary atherosclerosis. No mediastinal lymphadenopathy. LUNGS AND PLEURA: Mild mosaic attenuation / ground-glass opacity in the lungs bilaterally. Mild atelectasis in the lingula and bilateral lower lobes. No pleural effusion or pneumothorax. AIRWAYS: Narrowing of the trachea and bilateral mainstem bronchi (image 52), raising the possibility of tracheobronchial stenosis. SOFT TISSUES AND BONES: No acute bone or soft tissue abnormality. Mild degenerative changes of the visualized thoracolumbar spine. ABDOMEN AND PELVIS: LIVER: The liver is unremarkable. GALLBLADDER AND BILE DUCTS: Gallbladder is unremarkable. No biliary ductal dilatation. SPLEEN: Spleen demonstrates no acute abnormality. PANCREAS: Pancreas demonstrates no acute abnormality. ADRENAL GLANDS: Adrenal glands demonstrate no acute abnormality. KIDNEYS, URETERS AND BLADDER: Bilateral renal atrophy. Simple bilateral renal cysts, including a dominant 9.5 cm exophytic left upper pole cyst (image 37), benign (Bosniak 1). No stones in the kidneys or ureters. No hydronephrosis. No perinephric or periureteral stranding. Urinary bladder is decompressed and poorly visualized. GI AND BOWEL: Normal appendix (image 68). Stomach and duodenal  sweep demonstrate no acute abnormality. There is no bowel obstruction. No abnormal bowel wall thickening or distension. REPRODUCTIVE: Status post hysterectomy. PERITONEUM AND RETROPERITONEUM: No ascites or free air. LYMPH NODES: No lymphadenopathy. BONES AND SOFT TISSUES: No acute abnormality of the visualized bones. No focal soft tissue abnormality. VASCULATURE: Atherosclerotic calcifications of the abdominal aorta and branch vessels, although patent. Although not tailored for evaluation of the thoracic aorta, there is no evidence of thoracic aortic aneurysm or dissection. Thoracic atherosclerosis. IMPRESSION: 1. No pulmonary embolism. 2. Narrowing of the trachea and bilateral mainstem bronchi, raising the possibility of tracheobronchial stenosis. 3. No acute findings in the abdomen/pelvis. Electronically signed by: Pinkie Pebbles MD 06/21/2024 01:04 AM EDT RP Workstation: [id number]   CT ABDOMEN PELVIS W CONTRAST Addendum Date: 06/21/2024 ** ADDENDUM #1 ** ADDENDUM: Also noted are nondisplaced right posterolateral 7th and 8th rib fractures. No pneumothorax. ---------------------------------------------------- Electronically signed by: Pinkie Pebbles MD 06/21/2024 01:10 AM EDT RP Workstation: [id number]   Result Date: 06/21/2024 ** ORIGINAL REPORT ** EXAM: CTA CHEST PE WITH CONTRAST CT ABDOMEN AND PELVIS WITH CONTRAST 06/21/2024 12:49:21 AM TECHNIQUE: CTA of the chest was performed after the administration of intravenous contrast. Multiplanar reformatted images are provided for review. MIP images are provided for review. CT of the abdomen and pelvis was performed with the administration of intravenous contrast. Automated exposure control, iterative reconstruction, and/or weight based adjustment of the mA/kV was utilized to reduce the radiation dose to as low as reasonably achievable. COMPARISON: CT chest dated 08/09/2023 and CT abdomen/pelvis dated 06/07/2023. CLINICAL HISTORY: Pulmonary embolism (PE)  suspected, high prob. Pt reports that she has right sided  abdominal pain x 1 year and left shoulder pain for a long time and vaginal discharge x 1 year, as well as intermittent headaches x 2 years. When asked why she had staff call EMS tonight, she says because she wants to find out what's going on and because she had a bad episode of abdominal pain last night. Pt is alert and oriented x 4. FINDINGS: CHEST: PULMONARY ARTERIES: Pulmonary arteries are adequately opacified for evaluation. No evidence of pulmonary embolism. MEDIASTINUM: Cardiomegaly with left cardiomediastinal shift. Moderate 3-vessel coronary atherosclerosis. No mediastinal lymphadenopathy. LUNGS AND PLEURA: Mild mosaic attenuation / ground-glass opacity in the lungs bilaterally. Mild atelectasis in the lingula and bilateral lower lobes. No pleural effusion or pneumothorax. AIRWAYS: Narrowing of the trachea and bilateral mainstem bronchi (image 52), raising the possibility of tracheobronchial stenosis. SOFT TISSUES AND BONES: No acute bone or soft tissue abnormality. Mild degenerative changes of the visualized thoracolumbar spine. ABDOMEN AND PELVIS: LIVER: The liver is unremarkable. GALLBLADDER AND BILE DUCTS: Gallbladder is unremarkable. No biliary ductal dilatation. SPLEEN: Spleen demonstrates no acute abnormality. PANCREAS: Pancreas demonstrates no acute abnormality. ADRENAL GLANDS: Adrenal glands demonstrate no acute abnormality. KIDNEYS, URETERS AND BLADDER: Bilateral renal atrophy. Simple bilateral renal cysts, including a dominant 9.5 cm exophytic left upper pole cyst (image 37), benign (Bosniak 1). No stones in the kidneys or ureters. No hydronephrosis. No perinephric or periureteral stranding. Urinary bladder is decompressed and poorly visualized. GI AND BOWEL: Normal appendix (image 68). Stomach and duodenal sweep demonstrate no acute abnormality. There is no bowel obstruction. No abnormal bowel wall thickening or distension. REPRODUCTIVE:  Status post hysterectomy. PERITONEUM AND RETROPERITONEUM: No ascites or free air. LYMPH NODES: No lymphadenopathy. BONES AND SOFT TISSUES: No acute abnormality of the visualized bones. No focal soft tissue abnormality. VASCULATURE: Atherosclerotic calcifications of the abdominal aorta and branch vessels, although patent. Although not tailored for evaluation of the thoracic aorta, there is no evidence of thoracic aortic aneurysm or dissection. Thoracic atherosclerosis. IMPRESSION: 1. No pulmonary embolism. 2. Narrowing of the trachea and bilateral mainstem bronchi, raising the possibility of tracheobronchial stenosis. 3. No acute findings in the abdomen/pelvis. Electronically signed by: Pinkie Pebbles MD 06/21/2024 01:04 AM EDT RP Workstation: [id number]   CT HEAD WO CONTRAST ( ) Result Date: 06/21/2024 EXAM: CT HEAD WITHOUT CONTRAST 06/21/2024 12:49:21 AM TECHNIQUE: CT of the head was performed without the administration of intravenous contrast. Automated exposure control, iterative reconstruction, and/or weight based adjustment of the mA/kV was utilized to reduce the radiation dose to as low as reasonably achievable. COMPARISON: 09/30/2023 CLINICAL HISTORY: Headache, new onset (Age >= 51y). Pt reports that she has right sided abdominal pain x 1 year and left shoulder pain for a long time and vaginal discharge x 1 year, as well as intermittent headaches x 2 years. When asked why she had staff call EMS tonight, she says because she wants to find out what's going on and because she had a bad episode of abdominal pain last night. Pt is alert and oriented x 4. FINDINGS: BRAIN AND VENTRICLES: No acute hemorrhage. Gray-white differentiation is preserved. No hydrocephalus. No extra-axial collection. No mass effect or midline shift. Mild chronic ischemic white matter changes. ORBITS: No acute abnormality. SINUSES: Chronic right maxillary sinusitis. Bilateral mastoid effusions. SOFT TISSUES AND SKULL: No acute soft  tissue abnormality. No skull fracture. Calcific atherosclerosis of the vertebral and carotid arteries at the skull base. IMPRESSION: 1. No acute intracranial abnormality. 2. Mild chronic ischemic white matter changes. 3. Chronic right maxillary sinusitis and  bilateral mastoid effusions. Electronically signed by: Franky Stanford MD 06/21/2024 12:53 AM EDT RP Workstation: [id number]   US  ABDOMEN LIMITED RUQ (LIVER/GB) Result Date: 06/20/2024 CLINICAL DATA:  [id number] Upper abdominal pain [id number] EXAM: ULTRASOUND ABDOMEN LIMITED RIGHT UPPER QUADRANT COMPARISON:  CT abdomen pelvis 06/07/2023 FINDINGS: Gallbladder: No definite gallstones. Gallbladder wall thickening. No pericholecystic fluid. No sonographic Murphy sign noted by sonographer. Common bile duct: Diameter: 4 mm. Liver: No focal lesion identified. Increased parenchymal echogenicity. Portal vein is patent on color Doppler imaging with normal direction of blood flow towards the liver. Other: None. IMPRESSION: 1. Nonspecific gallbladder wall thickening. Finding can be seen in the setting of chronic liver disease. Correlate with liver function tests. 2. Hepatic steatosis. Please note limited evaluation for focal hepatic masses in a patient with hepatic steatosis due to decreased penetration of the acoustic ultrasound waves. Electronically Signed   By: Morgane  Naveau M.D.   On: 06/20/2024 23:58     PROCEDURES:  Critical Care performed: No    .1-3 Lead EKG Interpretation  Performed by: Tarquin Welcher, Josette SAILOR, DO Authorized by: Dhanvin Szeto, Josette SAILOR, DO     Interpretation: normal     ECG rate:  98   ECG rate assessment: normal     Rhythm: sinus rhythm     Ectopy: none     Conduction: normal       IMPRESSION / MDM / ASSESSMENT AND PLAN / ED COURSE  I reviewed the triage vital signs and the nursing notes.    Patient here with multiple complaints.  Complaining of right-sided chest and upper abdominal pain, shortness of breath, left thoracic back pain,  headache.  The patient is on the cardiac monitor to evaluate for evidence of arrhythmia and/or significant heart rate changes.   DIFFERENTIAL DIAGNOSIS (includes but not limited to):   ACS, CHF, COPD, PE, rib fractures, pneumonia, cholelithiasis, cholecystitis, pancreatitis, gastritis, colitis, bowel obstruction, UTI, kidney stone, pyelonephritis, intracranial hemorrhage, stroke   Patient's presentation is most consistent with acute presentation with potential threat to life or bodily function.   PLAN: Will obtain labs, urine, CT of the head, CTA of the chest, right upper quadrant ultrasound.  Will give pain medication.  Patient no longer makes urine.  No missed dialysis sessions.  She does seem diminished on exam with mild wheezing.  Will give breathing treatments, Solu-Medrol .   MEDICATIONS GIVEN IN ED: Medications  ondansetron  (ZOFRAN ) injection 4 mg (4 mg Intravenous Given 06/20/24 2348)  ipratropium-albuterol  (DUONEB) 0.5-2.5 (3) MG/3ML nebulizer solution 3 mL (3 mLs Nebulization Given 06/20/24 2347)  fentaNYL  (SUBLIMAZE ) injection 50 mcg (50 mcg Intravenous Given 06/20/24 2348)  iohexol  (OMNIPAQUE ) 350 MG/ML injection 100 mL (100 mLs Intravenous Contrast Given 06/21/24 0029)  ipratropium-albuterol  (DUONEB) 0.5-2.5 (3) MG/3ML nebulizer solution 3 mL (3 mLs Nebulization Given 06/21/24 0121)  methylPREDNISolone  sodium succinate  (SOLU-MEDROL ) 125 mg/2 mL injection 125 mg (125 mg Intravenous Given 06/21/24 0121)  ipratropium-albuterol  (DUONEB) 0.5-2.5 (3) MG/3ML nebulizer solution 3 mL (3 mLs Nebulization Given 06/21/24 0135)  morphine  (PF) 4 MG/ML injection 4 mg (4 mg Intravenous Given 06/21/24 0343)     ED COURSE: Labs show leukocytosis of 11,000.  Normal potassium.  Alkaline phosphatase minimally elevated but otherwise normal LFTs and lipase.  Low suspicion that this is ACS.  EKG nonischemic.  Pain reproducible with palpation.  Right upper quadrant ultrasound reviewed and interpreted by myself  and radiologist and shows no acute abnormality.  Obtain CT head, CTA chest, CT of the abdomen pelvis which were all reviewed  and interpreted by myself and the radiologist.  She does have nondisplaced fractures of the 7th and 8th right ribs likely attributing to her pain.  She denies any falls but states that she has been coughing very hard.  No pneumonia.  No PE.  Head CT unremarkable.  No acute abnormality within the abdomen/pelvis.  Still wheezing after multiple breathing treatments.  Recommended admission.  Will discuss with the hospitalist for admission for COPD exacerbation, pain control from 2 nondisplaced rib fractures.   CONSULTS:  Consulted and discussed patient's case with hospitalist, Dr. Lawence.  I have recommended admission and consulting physician agrees and will place admission orders.  Patient (and family if present) agree with this plan.   I reviewed all nursing notes, vitals, pertinent previous records.  All labs, EKGs, imaging ordered have been independently reviewed and interpreted by myself.    OUTSIDE RECORDS REVIEWED:  Echo July 2024  IMPRESSIONS     1. Left ventricular ejection fraction, by estimation, is 60 to 65%. The  left ventricle has normal function. The left ventricle has no regional  wall motion abnormalities. Left ventricular diastolic parameters are  indeterminate.   2. Right ventricular systolic function is moderately reduced. The right  ventricular size is moderately enlarged. There is severely elevated  pulmonary artery systolic pressure. The estimated right ventricular  systolic pressure is 79.0 mmHg.   3. Left atrial size was mildly dilated.   4. The mitral valve is normal in structure. Mild mitral valve  regurgitation. No evidence of mitral stenosis.   5. Tricuspid valve regurgitation is moderate.   6. The aortic valve is normal in structure. Aortic valve regurgitation is  not visualized. Aortic valve sclerosis is present, with no evidence of  aortic  valve stenosis.   7. The inferior vena cava is dilated in size with <50% respiratory  variability, suggesting right atrial pressure of 15 mmHg.    Reviewed last admission in July 2024.    FINAL CLINICAL IMPRESSION(S) / ED DIAGNOSES   Final diagnoses:  COPD with acute exacerbation (HCC)  Upper abdominal pain  Generalized headache  Closed fracture of multiple ribs of right side, initial encounter     Rx / DC Orders   ED Discharge Orders     None        Note:  This document was prepared using Dragon voice recognition software and may include unintentional dictation errors.      Irva Loser, Josette SAILOR, DO 06/21/24 [phone]

## 2024-06-20 NOTE — ED Triage Notes (Signed)
 This RN received phone call from [facility] covering provider. Per Provider pt c/o RUQ abd pain that radiates to her back, simethicone  and tylenol  given at facility did not relieve what patient c/o as 10/10 abd pain. Pt has hx of Dialysis, CHF, and HTN. Allergy to Sulfa. Per provider pt requested to be evaluated at ED, refused further workup at facility.

## 2024-06-20 NOTE — ED Triage Notes (Signed)
 Pt BIB [facility] from [facility] facility for multiple complaints. Pt reports that she has right sided abdominal pain x 1 year and left shoulder pain for a long time and vaginal discharge x 1 year, as well as intermittent headaches x 2 years. When asked why she had staff call EMS tonight, she says because she wants to find out what's going on and because she had a bad episode of abdominal pain last night. Pt is alert and oriented x 4.

## 2024-06-20 NOTE — ED Notes (Signed)
 Pt is dialysis pt and reports that she does not make urine

## 2024-06-20 NOTE — ED Notes (Signed)
 [place] at bedside

## 2024-06-21 ENCOUNTER — Emergency Department

## 2024-06-21 ENCOUNTER — Encounter: Payer: Self-pay | Admitting: Family Medicine

## 2024-06-21 DIAGNOSIS — J441 Chronic obstructive pulmonary disease with (acute) exacerbation: Secondary | ICD-10-CM | POA: Diagnosis present

## 2024-06-21 DIAGNOSIS — I482 Chronic atrial fibrillation, unspecified: Secondary | ICD-10-CM | POA: Diagnosis not present

## 2024-06-21 DIAGNOSIS — D631 Anemia in chronic kidney disease: Secondary | ICD-10-CM | POA: Diagnosis present

## 2024-06-21 DIAGNOSIS — S2241XA Multiple fractures of ribs, right side, initial encounter for closed fracture: Secondary | ICD-10-CM

## 2024-06-21 DIAGNOSIS — E66812 Obesity, class 2: Secondary | ICD-10-CM | POA: Diagnosis present

## 2024-06-21 DIAGNOSIS — I2510 Atherosclerotic heart disease of native coronary artery without angina pectoris: Secondary | ICD-10-CM | POA: Diagnosis present

## 2024-06-21 DIAGNOSIS — I5032 Chronic diastolic (congestive) heart failure: Secondary | ICD-10-CM | POA: Diagnosis present

## 2024-06-21 DIAGNOSIS — J209 Acute bronchitis, unspecified: Secondary | ICD-10-CM | POA: Diagnosis present

## 2024-06-21 DIAGNOSIS — Z794 Long term (current) use of insulin: Secondary | ICD-10-CM | POA: Diagnosis not present

## 2024-06-21 DIAGNOSIS — N2581 Secondary hyperparathyroidism of renal origin: Secondary | ICD-10-CM | POA: Diagnosis present

## 2024-06-21 DIAGNOSIS — E875 Hyperkalemia: Secondary | ICD-10-CM | POA: Diagnosis present

## 2024-06-21 DIAGNOSIS — E785 Hyperlipidemia, unspecified: Secondary | ICD-10-CM | POA: Diagnosis present

## 2024-06-21 DIAGNOSIS — I132 Hypertensive heart and chronic kidney disease with heart failure and with stage 5 chronic kidney disease, or end stage renal disease: Secondary | ICD-10-CM | POA: Diagnosis present

## 2024-06-21 DIAGNOSIS — N186 End stage renal disease: Secondary | ICD-10-CM

## 2024-06-21 DIAGNOSIS — Z6837 Body mass index (BMI) 37.0-37.9, adult: Secondary | ICD-10-CM | POA: Diagnosis not present

## 2024-06-21 DIAGNOSIS — E1151 Type 2 diabetes mellitus with diabetic peripheral angiopathy without gangrene: Secondary | ICD-10-CM | POA: Diagnosis present

## 2024-06-21 DIAGNOSIS — I071 Rheumatic tricuspid insufficiency: Secondary | ICD-10-CM | POA: Diagnosis present

## 2024-06-21 DIAGNOSIS — Z8249 Family history of ischemic heart disease and other diseases of the circulatory system: Secondary | ICD-10-CM | POA: Diagnosis not present

## 2024-06-21 DIAGNOSIS — R0902 Hypoxemia: Secondary | ICD-10-CM | POA: Diagnosis present

## 2024-06-21 DIAGNOSIS — Z992 Dependence on renal dialysis: Secondary | ICD-10-CM | POA: Diagnosis not present

## 2024-06-21 DIAGNOSIS — W19XXXA Unspecified fall, initial encounter: Secondary | ICD-10-CM | POA: Diagnosis present

## 2024-06-21 DIAGNOSIS — E1143 Type 2 diabetes mellitus with diabetic autonomic (poly)neuropathy: Secondary | ICD-10-CM | POA: Diagnosis present

## 2024-06-21 DIAGNOSIS — I4819 Other persistent atrial fibrillation: Secondary | ICD-10-CM | POA: Diagnosis present

## 2024-06-21 DIAGNOSIS — E1122 Type 2 diabetes mellitus with diabetic chronic kidney disease: Secondary | ICD-10-CM

## 2024-06-21 DIAGNOSIS — Z7901 Long term (current) use of anticoagulants: Secondary | ICD-10-CM | POA: Diagnosis not present

## 2024-06-21 LAB — GLUCOSE, CAPILLARY
Glucose-Capillary: 195 mg/dL — ABNORMAL HIGH (ref 70–99)
Glucose-Capillary: 303 mg/dL — ABNORMAL HIGH (ref 70–99)
Glucose-Capillary: 230 mg/dL — ABNORMAL HIGH (ref 70–99)
Glucose-Capillary: 190 mg/dL — ABNORMAL HIGH (ref 70–99)

## 2024-06-21 LAB — HEMOGLOBIN A1C
Hgb A1c MFr Bld: 6 % — ABNORMAL HIGH (ref 4.8–5.6)
Mean Plasma Glucose: 125.5 mg/dL

## 2024-06-21 LAB — CBG MONITORING, ED: Glucose-Capillary: 152 mg/dL — ABNORMAL HIGH (ref 70–99)

## 2024-06-21 MED ORDER — FLUCONAZOLE 50 MG PO TABS
150.0000 mg | ORAL_TABLET | Freq: Once | ORAL | Status: AC
  Administered 2024-06-21: 150 mg via ORAL
  Filled 2024-06-21: qty 1

## 2024-06-21 MED ORDER — IOHEXOL 350 MG/ML SOLN
100.0000 mL | Freq: Once | INTRAVENOUS | Status: AC | PRN
  Administered 2024-06-21: 100 mL via INTRAVENOUS

## 2024-06-21 MED ORDER — IPRATROPIUM-ALBUTEROL 0.5-2.5 (3) MG/3ML IN SOLN
3.0000 mL | Freq: Once | RESPIRATORY_TRACT | Status: AC
  Administered 2024-06-21: 3 mL via RESPIRATORY_TRACT
  Filled 2024-06-21: qty 3

## 2024-06-21 MED ORDER — INSULIN ASPART 100 UNIT/ML IJ SOLN
0.0000 [IU] | Freq: Three times a day (TID) | INTRAMUSCULAR | Status: DC
  Administered 2024-06-21: 2 [IU] via SUBCUTANEOUS
  Administered 2024-06-21: 4 [IU] via SUBCUTANEOUS
  Administered 2024-06-22: 3 [IU] via SUBCUTANEOUS
  Administered 2024-06-23: 1 [IU] via SUBCUTANEOUS
  Administered 2024-06-23 (×2): 2 [IU] via SUBCUTANEOUS
  Administered 2024-06-24: 3 [IU] via SUBCUTANEOUS
  Administered 2024-06-25 (×2): 2 [IU] via SUBCUTANEOUS
  Filled 2024-06-21 (×7): qty 1

## 2024-06-21 MED ORDER — TRAZODONE HCL 50 MG PO TABS
25.0000 mg | ORAL_TABLET | Freq: Every evening | ORAL | Status: DC | PRN
  Filled 2024-06-21: qty 1

## 2024-06-21 MED ORDER — PANTOPRAZOLE SODIUM 40 MG PO TBEC
40.0000 mg | DELAYED_RELEASE_TABLET | Freq: Every day | ORAL | Status: DC
  Administered 2024-06-21 – 2024-06-25 (×5): 40 mg via ORAL
  Filled 2024-06-21 (×5): qty 1

## 2024-06-21 MED ORDER — ALLOPURINOL 100 MG PO TABS
100.0000 mg | ORAL_TABLET | Freq: Every day | ORAL | Status: DC
  Administered 2024-06-21 – 2024-06-25 (×5): 100 mg via ORAL
  Filled 2024-06-21 (×5): qty 1

## 2024-06-21 MED ORDER — MIDODRINE HCL 5 MG PO TABS
20.0000 mg | ORAL_TABLET | ORAL | Status: DC | PRN
  Administered 2024-06-22: 20 mg via ORAL
  Filled 2024-06-21: qty 4

## 2024-06-21 MED ORDER — CHLORHEXIDINE GLUCONATE CLOTH 2 % EX PADS
6.0000 | MEDICATED_PAD | Freq: Every day | CUTANEOUS | Status: DC
  Administered 2024-06-22 – 2024-06-25 (×4): 6 via TOPICAL

## 2024-06-21 MED ORDER — APIXABAN 2.5 MG PO TABS
2.5000 mg | ORAL_TABLET | Freq: Two times a day (BID) | ORAL | Status: DC
  Administered 2024-06-21 – 2024-06-25 (×9): 2.5 mg via ORAL
  Filled 2024-06-21 (×9): qty 1

## 2024-06-21 MED ORDER — SODIUM CHLORIDE 0.9 % IV SOLN: INTRAVENOUS | Status: DC

## 2024-06-21 MED ORDER — EZETIMIBE 10 MG PO TABS
10.0000 mg | ORAL_TABLET | Freq: Every evening | ORAL | Status: DC
Start: 2024-06-21 — End: 2024-06-25
  Administered 2024-06-21 – 2024-06-24 (×4): 10 mg via ORAL
  Filled 2024-06-21 (×5): qty 1

## 2024-06-21 MED ORDER — INSULIN GLARGINE-YFGN 100 UNIT/ML ~~LOC~~ SOLN
5.0000 [IU] | Freq: Every day | SUBCUTANEOUS | Status: DC
  Administered 2024-06-21 – 2024-06-25 (×5): 5 [IU] via SUBCUTANEOUS
  Filled 2024-06-21 (×5): qty 0.05

## 2024-06-21 MED ORDER — ACETAMINOPHEN 325 MG PO TABS: 650.0000 mg | ORAL_TABLET | Freq: Four times a day (QID) | ORAL | Status: DC | PRN

## 2024-06-21 MED ORDER — ACETAMINOPHEN 650 MG RE SUPP: 650.0000 mg | Freq: Four times a day (QID) | RECTAL | Status: DC | PRN

## 2024-06-21 MED ORDER — SODIUM CHLORIDE 0.9 % IV SOLN
1.0000 g | INTRAVENOUS | Status: AC
  Administered 2024-06-21 – 2024-06-25 (×5): 1 g via INTRAVENOUS
  Filled 2024-06-21 (×5): qty 10

## 2024-06-21 MED ORDER — INSULIN ASPART 100 UNIT/ML IJ SOLN
0.0000 [IU] | Freq: Every day | INTRAMUSCULAR | Status: DC
  Administered 2024-06-23: 2 [IU] via SUBCUTANEOUS
  Administered 2024-06-24: 3 [IU] via SUBCUTANEOUS
  Filled 2024-06-21 (×2): qty 1

## 2024-06-21 MED ORDER — METHOCARBAMOL 500 MG PO TABS: 500.0000 mg | ORAL_TABLET | Freq: Three times a day (TID) | ORAL | Status: DC | PRN

## 2024-06-21 MED ORDER — MIDODRINE HCL 5 MG PO TABS
10.0000 mg | ORAL_TABLET | Freq: Three times a day (TID) | ORAL | Status: DC
  Administered 2024-06-21 – 2024-06-25 (×11): 10 mg via ORAL
  Filled 2024-06-21 (×11): qty 2

## 2024-06-21 MED ORDER — METHYLPREDNISOLONE SODIUM SUCC 40 MG IJ SOLR
40.0000 mg | Freq: Two times a day (BID) | INTRAMUSCULAR | Status: DC
  Administered 2024-06-21 (×2): 40 mg via INTRAVENOUS
  Filled 2024-06-21 (×2): qty 1

## 2024-06-21 MED ORDER — ONDANSETRON HCL 4 MG/2ML IJ SOLN: 4.0000 mg | Freq: Four times a day (QID) | INTRAMUSCULAR | Status: DC | PRN

## 2024-06-21 MED ORDER — MAGNESIUM HYDROXIDE 400 MG/5ML PO SUSP
30.0000 mL | Freq: Every day | ORAL | Status: DC | PRN
  Administered 2024-06-24: 30 mL via ORAL
  Filled 2024-06-21: qty 30

## 2024-06-21 MED ORDER — BENZONATATE 100 MG PO CAPS
100.0000 mg | ORAL_CAPSULE | Freq: Three times a day (TID) | ORAL | Status: DC
  Administered 2024-06-21 – 2024-06-25 (×12): 100 mg via ORAL
  Filled 2024-06-21 (×12): qty 1

## 2024-06-21 MED ORDER — ROSUVASTATIN CALCIUM 10 MG PO TABS
10.0000 mg | ORAL_TABLET | Freq: Every evening | ORAL | Status: DC
  Administered 2024-06-21 – 2024-06-24 (×4): 10 mg via ORAL
  Filled 2024-06-21 (×4): qty 1

## 2024-06-21 MED ORDER — DROXIDOPA 100 MG PO CAPS
200.0000 mg | ORAL_CAPSULE | Freq: Three times a day (TID) | ORAL | Status: DC
  Administered 2024-06-21 – 2024-06-25 (×11): 200 mg via ORAL
  Filled 2024-06-21 (×14): qty 2

## 2024-06-21 MED ORDER — IPRATROPIUM-ALBUTEROL 0.5-2.5 (3) MG/3ML IN SOLN
3.0000 mL | Freq: Four times a day (QID) | RESPIRATORY_TRACT | Status: DC
  Administered 2024-06-21: 3 mL via RESPIRATORY_TRACT
  Filled 2024-06-21: qty 3

## 2024-06-21 MED ORDER — OXYCODONE-ACETAMINOPHEN 5-325 MG PO TABS
1.0000 | ORAL_TABLET | ORAL | Status: DC | PRN
  Administered 2024-06-21 – 2024-06-23 (×4): 1 via ORAL
  Filled 2024-06-21 (×4): qty 1

## 2024-06-21 MED ORDER — MORPHINE SULFATE (PF) 4 MG/ML IV SOLN
4.0000 mg | Freq: Once | INTRAVENOUS | Status: AC
  Administered 2024-06-21: 4 mg via INTRAVENOUS
  Filled 2024-06-21: qty 1

## 2024-06-21 MED ORDER — CINACALCET HCL 30 MG PO TABS
30.0000 mg | ORAL_TABLET | Freq: Every day | ORAL | Status: DC
  Administered 2024-06-21 – 2024-06-24 (×4): 30 mg via ORAL
  Filled 2024-06-21 (×4): qty 1

## 2024-06-21 MED ORDER — HYDROCOD POLI-CHLORPHE POLI ER 10-8 MG/5ML PO SUER
5.0000 mL | Freq: Two times a day (BID) | ORAL | Status: DC | PRN
  Administered 2024-06-21 – 2024-06-24 (×4): 5 mL via ORAL
  Filled 2024-06-21 (×4): qty 5

## 2024-06-21 MED ORDER — SODIUM CHLORIDE 0.9 % IV SOLN
500.0000 mg | INTRAVENOUS | Status: DC
  Administered 2024-06-21 – 2024-06-23 (×3): 500 mg via INTRAVENOUS
  Filled 2024-06-21 (×3): qty 5

## 2024-06-21 MED ORDER — ONDANSETRON HCL 4 MG PO TABS: 4.0000 mg | ORAL_TABLET | Freq: Four times a day (QID) | ORAL | Status: DC | PRN

## 2024-06-21 MED ORDER — IPRATROPIUM-ALBUTEROL 0.5-2.5 (3) MG/3ML IN SOLN
3.0000 mL | Freq: Three times a day (TID) | RESPIRATORY_TRACT | Status: DC
  Administered 2024-06-21 – 2024-06-22 (×2): 3 mL via RESPIRATORY_TRACT
  Filled 2024-06-21 (×2): qty 3

## 2024-06-21 MED ORDER — APIXABAN 5 MG PO TABS: 5.0000 mg | ORAL_TABLET | Freq: Two times a day (BID) | ORAL | Status: DC

## 2024-06-21 MED ORDER — FLUDROCORTISONE ACETATE 0.1 MG PO TABS
0.2000 mg | ORAL_TABLET | Freq: Two times a day (BID) | ORAL | Status: DC
  Administered 2024-06-21 – 2024-06-25 (×9): 0.2 mg via ORAL
  Filled 2024-06-21 (×11): qty 2

## 2024-06-21 MED ORDER — FERRIC CITRATE 1 GM 210 MG(FE) PO TABS
420.0000 mg | ORAL_TABLET | Freq: Three times a day (TID) | ORAL | Status: DC
  Administered 2024-06-21 – 2024-06-25 (×11): 420 mg via ORAL
  Filled 2024-06-21 (×14): qty 2

## 2024-06-21 MED ORDER — METHYLPREDNISOLONE SODIUM SUCC 125 MG IJ SOLR
125.0000 mg | Freq: Once | INTRAMUSCULAR | Status: AC
  Administered 2024-06-21: 125 mg via INTRAVENOUS
  Filled 2024-06-21 (×2): qty 2

## 2024-06-21 MED ORDER — ENOXAPARIN SODIUM 60 MG/0.6ML IJ SOSY: 0.5000 mg/kg | PREFILLED_SYRINGE | INTRAMUSCULAR | Status: DC

## 2024-06-21 NOTE — H&P (Addendum)
 History and Physical    Patient: Jacqueline [name] FMW:[id number] DOB: 1946-08-05 DOA: 06/20/2024 DOS: the patient was seen and examined on 06/21/2024 PCP: Austin Candyce Stall, MD  Patient coming from: [facility]   Chief Complaint:  SOB and right side pain  HPI: Jacqueline [name] is a 78 y.o. female with medical history significant of atrial fibrillation on Eliquis , chronic kidney disease on dialysis Tuesdays Thursdays Saturdays, CAD, type 2 diabetes mellitus on insulin , dyslipidemia, heart failure with preserved EF.  Questionable history of COPD and chronic O2 use.  Presented with multiple complaints.  Primary complaint was of pleuritic chest pain on the right with inspiration and shortness of breath.  Reported productive cough although when clarified patient has had significant intermittent productive cough for the past 2 years.  No infectious type symptoms such as fevers or chills.  She has also been having some upper thoracic right-sided back pain as well.  She had significant wheezing upon presentation that responded to multiple DuoNeb treatments.  Workup in the ED included a CT of the chest that revealed mild mosaic attenuation and groundglass opacities in the bilateral lungs.  There was also a finding of narrowing of the trachea and bilateral mainstem bronchus raising possibility of tracheobronchial stenosis.  Incidental finding of nondisplaced right posterolateral 7th and 8th rib fractures.  Patient denied fall or other trauma.  Started on Rocephin  for possible bronchitis.  Hospitalist service has been asked to evaluate the patient for admission.  Review of Systems: As mentioned in the history of present illness. All other systems reviewed and are negative.  In addition to above patient states that she is a prior smoker.  Denied formal evaluation for COPD.  States she has a pending pulmonology appointment on July 30.  Also complained of persistent vaginal discharge and burning of  the external vagina.  Has been treated at the facility for yeast vaginitis initially with topical agents and subsequently was given what sounded like a one-time dose of 150 mg of Diflucan .  Has not been seen by gynecology.  States she no longer has a uterus.   Past Medical History:  Diagnosis Date   Arthritis    left knee (12/04/2016)   CAD (coronary artery disease)    a. 12/2016 NSTEMI/PCI: LM nl, LaD 20p/d, D1 20, OM2 20, OM3 20, RCA 69m (3.0x18 Resolute DES). EF 65%.   Chronic lower back pain    Diverticulosis    on CT   ESRD on dialysis [facility])    GERD (gastroesophageal reflux disease)    Hiatal hernia    History of echocardiogram    a. 09/2022 Echo: EF 60-65%, mild asymm LVH, mod reduced RV fxn, RVSP . Mildly dil RA. Mild AI. Ao sclerosis. Mild MR.   Hyperlipidemia    Hypertension    a. 12/2016 labetalol  d/c'd 2/2 hypotension in HD.   Morbid obesity (HCC)    Obesity    Persistent atrial fibrillation (HCC)    a. 05/2023 Zio: 100% afib avg HR 52 (32-99).   Pneumonia    couple times (12/04/2016)   Symptomatic bradycardia 06/07/2023   Type II diabetes mellitus [facility])    Past Surgical History:  Procedure Laterality Date   A/V FISTULAGRAM N/A 11/12/2023   Procedure: A/V Fistulagram;  Surgeon: Melia Lynwood ORN, MD;  Location: [facility] INVASIVE CV LAB;  Service: Cardiovascular;  Laterality: N/A;   ABDOMINAL HYSTERECTOMY     AV FISTULA PLACEMENT Left 01/27/2014   Procedure: ARTERIOVENOUS (AV) FISTULA CREATION;  Surgeon: Lynwood JONETTA Collum, MD;  Location: [facility] OR;  Service: Vascular;  Laterality: Left;   AV FISTULA REPAIR     had it cleaned out   CARDIAC CATHETERIZATION  ?1980s   CARDIAC CATHETERIZATION N/A 12/04/2016   Procedure: Left Heart Cath Angiography;  Surgeon: Lonni JONETTA Cash, MD;  Location: [facility] INVASIVE CV LAB;  Service: Cardiovascular;  Laterality: N/A;   CARDIAC CATHETERIZATION N/A 12/04/2016   Procedure: Coronary Stent Intervention;  Surgeon: Lonni JONETTA Cash, MD;   Location: [facility] INVASIVE CV LAB;  Service: Cardiovascular;  Laterality: N/A;  Mid RCA   CATARACT EXTRACTION W/ INTRAOCULAR LENS IMPLANT Left    COLONOSCOPY W/ BIOPSIES AND POLYPECTOMY     CORONARY ANGIOPLASTY WITH STENT PLACEMENT  12/04/2016   ESOPHAGOGASTRODUODENOSCOPY     INSERTION OF DIALYSIS CATHETER N/A 01/19/2014   Procedure: INSERTION OF DIALYSIS CATHETER;  Surgeon: Krystal JULIANNA Doing, MD;  Location: [facility] OR;  Service: Vascular;  Laterality: N/A;   PERIPHERAL VASCULAR BALLOON ANGIOPLASTY  11/12/2023   Procedure: PERIPHERAL VASCULAR BALLOON ANGIOPLASTY;  Surgeon: Melia Lynwood ORN, MD;  Location: [facility] INVASIVE CV LAB;  Service: Cardiovascular;;   RIGHT/LEFT HEART CATH AND CORONARY ANGIOGRAPHY N/A 06/11/2023   Procedure: RIGHT/LEFT HEART CATH AND CORONARY ANGIOGRAPHY;  Surgeon: Mady Lonni, MD;  Location: [facility] INVASIVE CV LAB;  Service: Cardiovascular;  Laterality: N/A;   Social History:  reports that she quit smoking about 40 years ago. Her smoking use included cigarettes. She started smoking about 60 years ago. She has a 20 pack-year smoking history. She has never used smokeless tobacco. She reports that she does not drink alcohol  and does not use drugs.  Allergies  Allergen Reactions   Sulfa Antibiotics Other (See Comments)   Sulfa Drugs Cross Reactors Other (See Comments)    Doesn't remember     Family History  Problem Relation Age of Onset   Diabetes Mother    Peripheral vascular disease Mother        amputation   CAD Father    Heart disease Father    Hypertension Father    Heart attack Father    CAD Sister    Cancer Brother        x 2 brothers, type unknown   Breast cancer Sister     Prior to Admission medications   Medication Sig Start Date End Date Taking? Authorizing Provider  acetaminophen  (TYLENOL ) 650 MG CR tablet Take 650 mg by mouth every 8 (eight) hours. (0600, 1400 & 2200)    [provider]  albuterol  (PROVENTIL ) (2.5 MG/3ML) 0.083% nebulizer solution Take 2.5  mg by nebulization every 6 (six) hours as needed for wheezing or shortness of breath.    [provider]  allopurinol  (ZYLOPRIM ) 100 MG tablet Take 100 mg by mouth daily.    [provider]  aluminum -magnesium  hydroxide-simethicone  (MAALOX) 200-200-20 MG/5ML SUSP Take 30 mLs by mouth 4 (four) times daily -  before meals and at bedtime. Patient taking differently: Take 15 mLs by mouth every 4 (four) hours as needed (heartburn.). 07/20/23   Viviann Pastor, MD  apixaban  (ELIQUIS ) 5 MG TABS tablet Take 1 tablet (5 mg total) by mouth 2 (two) times daily. 07/01/23   Fausto Burnard LABOR, DO  bisacodyl  (DULCOLAX) 10 MG suppository Place 10 mg rectally daily as needed for moderate constipation.    [provider]  cinacalcet  (SENSIPAR ) 30 MG tablet Take 30 mg by mouth daily with supper. (1800)    [provider]  clotrimazole-betamethasone (LOTRISONE) cream Apply 1  Application topically See admin instructions. Apply as needed to affected area of ear for itching/irritation.    [provider]  dextromethorphan -guaiFENesin  (MUCINEX  DM) 30-600 MG 12hr tablet Take 1 tablet by mouth 2 (two) times daily. (0900 & 1800)    [provider]  Droxidopa  200 MG CAPS Take 200 mg by mouth with breakfast, with lunch, and with evening meal. 01/27/24   Malvina Alm DASEN, MD  ezetimibe  (ZETIA ) 10 MG tablet Take 1 tablet (10 mg total) by mouth daily. Patient taking differently: Take 10 mg by mouth every evening. (1800) 04/30/22   Darron Deatrice LABOR, MD  ferric citrate  (AURYXIA ) 1 GM 210 MG(Fe) tablet Take 420 mg by mouth 3 (three) times daily with meals.    [provider]  fexofenadine (ALLEGRA) 180 MG tablet Take 180 mg by mouth in the morning. (0800)    [provider]  fludrocortisone  (FLORINEF ) 0.1 MG tablet Take 2 tablets (0.2 mg total) by mouth 2 (two) times daily. 07/01/23   Fausto Burnard LABOR, DO  fluticasone (FLONASE) 50 MCG/ACT nasal spray Place 1 spray  into both nostrils in the morning. (0900)    [provider]  guaiFENesin -dextromethorphan  (ROBITUSSIN DM) 100-10 MG/5ML syrup Take 15 mLs by mouth every 4 (four) hours as needed for cough. 07/01/23   Fausto Burnard LABOR, DO  HYDROcodone -acetaminophen  (NORCO/VICODIN) 5-325 MG tablet Take 1-2 tablets by mouth every 6 (six) hours as needed for moderate pain. 07/01/23   Fausto Burnard LABOR, DO  insulin  aspart (NOVOLOG ) 100 UNIT/ML injection Inject 0-20 Units into the skin 3 (three) times daily with meals. For CBG 70-120: 0 units For CBG 121-150: 3 units For CBG 151-200: 4 units For CBG 201-250: 7 units For CBG 251-300: 11 units For CBG 301-350: 15 units For CBG 351-400: 20 units For CBG > 400: 25 units and call your doctor 07/01/23   Fausto Burnard A, DO  insulin  glargine-yfgn (SEMGLEE ) 100 UNIT/ML injection Inject 0.05 mLs (5 Units total) into the skin daily. 07/02/23   Fausto Burnard LABOR, DO  Lidocaine  HCl (ASPERCREME LIDOCAINE ) 4 % CREA Apply 1 Application topically in the morning and at bedtime. Applied to bilateral knees    [provider]  magnesium  hydroxide (MILK OF MAGNESIA) 400 MG/5ML suspension Take 30 mLs by mouth daily as needed (if no BM in 3 days.).    [provider]  Menthol -Zinc Oxide (MOISTURE BARRIER EX) Apply 1 Application topically as directed. Apply barrier cream to buttocks every shift    [provider]  midodrine  (PROAMATINE ) 10 MG tablet Take 1 tablet (10 mg total) by mouth 3 (three) times daily with meals. 06/09/23   Shellia Inge BIRCH, NP  midodrine  (PROAMATINE ) 10 MG tablet Take 20 mg by mouth as needed (for systolic BP less than 100.).    [provider]  Multiple Vitamin (MULTIVITAMIN WITH MINERALS) TABS tablet Take 1 tablet by mouth at bedtime.    [provider]  naloxone [facility]) nasal spray 4 mg/0.1 mL Place 1 spray into the nose 3 (three) times daily as needed (signs and symptoms of opioid overdose).    [provider]  nitroGLYCERIN  (NITROSTAT ) 0.4 MG SL tablet Place 1 tablet (0.4 mg total) under the tongue every 5 (five) minutes as needed for chest pain. 07/01/23   Fausto Burnard LABOR, DO  ondansetron  (ZOFRAN -ODT) 4 MG disintegrating tablet Take 4 mg by mouth every 8 (eight) hours as needed for nausea or vomiting.    [provider]  OXYGEN   Inhale 2 L/min into the lungs as needed (shortness of breath or decreased O2).    [provider]  pantoprazole  (PROTONIX ) 40 MG tablet Take 1 tablet (40 mg total) by mouth daily. 07/02/23   Fausto Burnard LABOR, DO  polyethylene glycol (MIRALAX  / GLYCOLAX ) 17 g packet Take 17 g by mouth daily as needed for moderate constipation. 06/09/23   Keene, Jeremiah D, NP  polyvinyl alcohol  (LIQUIFILM TEARS) 1.4 % ophthalmic solution Place 1 drop into both eyes as needed for dry eyes. Patient taking differently: Place 1 drop into both eyes every 4 (four) hours as needed for dry eyes. 07/01/23   Fausto Burnard LABOR, DO  rosuvastatin  (CRESTOR ) 10 MG tablet Take 1 tablet (10 mg total) by mouth daily. Patient taking differently: Take 10 mg by mouth every evening. 10/03/22   Jhonny Calvin NOVAK, MD  sodium phosphate (FLEET) ENEM Place 1 enema rectally daily as needed for severe constipation (if no BM results in 12 hours from dulcolax suppository).    [provider]  sorbitol 70 % solution Take 30 mLs by mouth daily as needed (constipation.).    [provider]    Physical Exam: Vitals:   06/21/24 0330 06/21/24 0450 06/21/24 0500 06/21/24 0600  BP: (!) 123/56 135/66 135/66   Pulse: 99 100 100   Resp: (!) 24 20 20    Temp:  98.2 F (36.8 C) 98.2 F (36.8 C)   TempSrc:      SpO2: 96% 97% 97%   Weight:    106.1 kg  Height:    5' 5 (1.651 m)   Constitutional: NAD, calm, comfortable Respiratory: clear to auscultation bilaterally, few scattered wheezes on posterior exam, no crackles. Normal respiratory effort. No accessory muscle use.  2  L. Cardiovascular: Regular rate and rhythm, no murmurs / rubs / gallops. No extremity edema. 2+ pedal pulses. Abdomen: no tenderness, no masses palpated. No hepatosplenomegaly. Bowel sounds positive.  Musculoskeletal: no clubbing / cyanosis. No joint deformity upper and lower extremities. Good ROM, no contractures. Normal muscle tone.  Skin: no rashes, lesions, ulcers. No induration Neurologic: CN 2-12 grossly intact. Sensation intact,  Strength 3/5 x all 4 extremities.  Was unable to sit forward in the bed unassisted. Psychiatric: Alert and oriented x 3. Normal mood.    Data Reviewed:  Sodium 136, potassium 4.7, chloride 94, CO2 28, anion gap 14, glucose 136, BUN 46, creatinine 5.98, alkaline phosphatase 143, LFTs normal  WBC 11,000, hemoglobin 12, MCV 105.9, platelets 188,000  CT abdomen and chest as documented above  Assessment and Plan: ?  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  Patient states she has never had formal PFTs completed  Posterolateral rib fractures Explains patient back pain.  She reports pain has been ongoing for over 1 year and denied any fall Utilize both low-dose Robaxin  as well as low-dose Percocet for pain Physical therapy evaluation noting patient was having  difficulty even sitting still forward  in the bed  Chronic atrial fibrillation Currently rate controlled Continue Eliquis  Maintain potassium greater than or equal to 4.0 Was not on AVN blocking agents prior to admission  Diabetes mellitus 2 on insulin  Follow CBGs AC/at bedtime and provide SSI-monitor for hyperglycemia due to requirement of IV steroids Continue insulin  glargine low-dose Hemoglobin A1c 1 year ago was 5.8 Globin A1c this admission is pending  Self report of vaginitis Continues to report burning pain on external vagina.  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 Inpatient status is judged to be reasonable and necessary in order to provide the required intensity of service to ensure the patient's safety. The patient's presenting symptoms, physical exam findings, and initial radiographic and laboratory data in the context of their chronic comorbidities is felt to place them at high risk for further clinical deterioration. Furthermore, it is not anticipated that the patient will be medically stable for discharge from  the hospital within 2 midnights of admission.   * I certify that at the point of admission it is my clinical judgment that the patient will require inpatient hospital care spanning beyond 2 midnights from the point of admission due to high intensity of service, high risk for further deterioration and high frequency of surveillance required.*  Author: Isaiah Lever, NP 06/21/2024 6:51 AM  For on call review [email or web address].   Addendum.  Patient seen and examined and agree with plan.  Will give a dose of Diflucan  for vaginal symptoms and see if that helps.  Patient states she was recently started on oxygen  from last hospitalization.  Will need to follow-up with pulmonary and ENT as outpatient.  Physical therapy evaluation.  HEENT: Normocephalic, throat no exudate Cardiovascular system: S1, S2 normal Lungs: Decreased breath sounds bilaterally Abdomen: Soft nontender Extremities: Positive for lower extremity edema Skin: No rash Neurological: Patient is alert and answers all questions appropriately Dr. Charlie Patterson

## 2024-06-21 NOTE — Progress Notes (Signed)
 Anticoagulation monitoring(Lovenox ):  78 yo female ordered Lovenox  40 mg Q24h    Filed Weights   06/21/24 0600  Weight: 106.1 kg (233 lb 14.5 oz)   BMI 39   Lab Results  Component Value Date   CREATININE 5.98 (H) 06/20/2024   CREATININE 5.47 (H) 01/27/2024   CREATININE 3.68 (H) 09/30/2023   Estimated Creatinine Clearance: 9.1 mL/min (A) (by C-G formula based on SCr of 5.98 mg/dL (H)). Hemoglobin & Hematocrit     Component Value Date/Time   HGB 12.0 06/20/2024 2137   HGB 10.8 (L) 02/25/2022 1709   HCT 39.7 06/20/2024 2137   HCT 33.6 (L) 02/25/2022 1709     Per Protocol for Patient with estCrcl > 30 ml/min and BMI > 30, will transition to Lovenox  52.5 mg Q24h.

## 2024-06-21 NOTE — ED Notes (Signed)
 Back from CT

## 2024-06-21 NOTE — Progress Notes (Signed)
 [facility]  ROUNDING NOTE   Subjective:   Jacqueline [name]  is a 78 y.o.  female  with a PMHx of ESRD on HD, diabetes mellitus type 2, chronic hypotension, peripheral arterial disease, obstructive sleep apnea, GERD, obesity, hyperlipidemia, anemia chronic kidney disease, secondary hyperparathyroidism.  Patient presented to the emergency department with multiple complaints from the rehab facility.  Complaints including shoulder pain, abdominal pain, headaches, and vaginal discharge.  Patient has been admitted for COPD exacerbation (HCC) [J44.1] Upper abdominal pain [R10.10] Generalized headache [R51.9] COPD with acute exacerbation (HCC) [J44.1] Closed fracture of multiple ribs of right side, initial encounter [S22.41XA]  Patient is known to our practice and receives outpatient dialysis treatments at [facility], on a TTS schedule, supervised by [facility] physicians.  Confirms her last dialysis treatment was on Saturday.  She states she has been experiencing chest pain with shortness of breath for few days now.  Denies any recently missed dialysis treatments.  No known fever or chills.  Does have productive cough.  Labs on ED arrival unremarkable for renal patient.  White count 11.0.  CT abdomen pelvis negative for pulmonary embolism.  CT angio chest confirms negative result.  CT head negative for acute findings, shows mild chronic ischemic white matter changes.  Abdominal ultrasound was negative for acute findings.  We have been consulted to maintain dialysis needs during this admission.   Objective:  Vital signs in last 24 hours:  Temp:  [97.8 F (36.6 C)-98.6 F (37 C)] 98.6 F (37 C) (07/21 1456) Pulse Rate:  [65-100] 75 (07/21 1456) Resp:  [15-26] 18 (07/21 1456) BP: (111-144)/(49-82) 119/57 (07/21 1456) SpO2:  [94 %-100 %] 98 % (07/21 1456) Weight:  [106.1 kg] 106.1 kg (07/21 0600)  Weight change:  Filed Weights   06/21/24 0600  Weight: 106.1 kg     Intake/Output: No intake/output data recorded.   Intake/Output this shift:  Total I/O In: 480 [P.O.:480] Out: -   Physical Exam: General: NAD  Head: Normocephalic, atraumatic. Moist oral mucosal membranes  Eyes: Anicteric  Neck: Supple  Lungs:  Clear to auscultation, normal effort  Heart: Regular rate and rhythm  Abdomen:  Soft, nontender  Extremities: 2+ peripheral edema.  Neurologic: Awake, alert, conversant  Skin: Warm,dry, no rash  Access: Left aVF    Basic Metabolic Panel: Recent Labs  Lab 06/20/24 2137  NA 136  K 4.7  CL 94*  CO2 28  GLUCOSE 136*  BUN 46*  CREATININE 5.98*  CALCIUM  8.1*    Liver Function Tests: Recent Labs  Lab 06/20/24 2137  AST 27  ALT 17  ALKPHOS 143*  BILITOT 0.8  PROT 7.7  ALBUMIN  3.5   Recent Labs  Lab 06/20/24 2137  LIPASE 50   No results for input(s): AMMONIA in the last 168 hours.  CBC: Recent Labs  Lab 06/20/24 2137  WBC 11.0*  HGB 12.0  HCT 39.7  MCV 105.9*  PLT 188    Cardiac Enzymes: No results for input(s): CKTOTAL, CKMB, CKMBINDEX, TROPONINI in the last 168 hours.  BNP: Invalid input(s): POCBNP  CBG: Recent Labs  Lab 06/21/24 0342 06/21/24 0814 06/21/24 1117  GLUCAP 152* 195* 303*    Microbiology: Results for orders placed or performed during the hospital encounter of 06/07/23  Blood culture (routine x 2)     Status: None   Collection Time: 06/07/23 12:54 PM   Specimen: BLOOD  Result Value Ref Range Status   Specimen Description BLOOD RIGHT ANTECUBITAL  Final  Special Requests   Final    BOTTLES DRAWN AEROBIC AND ANAEROBIC Blood Culture adequate volume   Culture   Final    NO GROWTH 5 DAYS Performed at [facility], [address] [place], [place] [address]    Report Status 06/12/2023 FINAL  Final  Blood culture (routine x 2)     Status: None   Collection Time: 06/07/23 12:54 PM   Specimen: BLOOD  Result Value Ref Range Status   Specimen Description  BLOOD BLOOD RIGHT FOREARM  Final   Special Requests   Final    BOTTLES DRAWN AEROBIC AND ANAEROBIC Blood Culture adequate volume   Culture   Final    NO GROWTH 5 DAYS Performed at [facility], [address] [place], [place] [address]    Report Status 06/12/2023 FINAL  Final  MRSA Next Gen by PCR, Nasal     Status: None   Collection Time: 06/07/23 10:18 PM   Specimen: Nasal Mucosa; Nasal Swab  Result Value Ref Range Status   MRSA by PCR Next Gen NOT DETECTED NOT DETECTED Final    Comment: (NOTE) The GeneXpert MRSA Assay (FDA approved for NASAL specimens only), is one component of a comprehensive MRSA colonization surveillance program. It is not intended to diagnose MRSA infection nor to guide or monitor treatment for MRSA infections. Test performance is not FDA approved in patients less than 26 years old. Performed at [facility], [address] [place], [place] [address]     Coagulation Studies: No results for input(s): LABPROT, INR in the last 72 hours.  Urinalysis: No results for input(s): COLORURINE, LABSPEC, PHURINE, GLUCOSEU, HGBUR, BILIRUBINUR, KETONESUR, PROTEINUR, UROBILINOGEN, NITRITE, LEUKOCYTESUR in the last 72 hours.  Invalid input(s): APPERANCEUR    Imaging:    Medications:    azithromycin  500 mg (06/21/24 1037)   cefTRIAXone  (ROCEPHIN )  IV 1 g (06/21/24 0835)    allopurinol   100 mg Oral Daily   apixaban   2.5 mg Oral BID   benzonatate   100 mg Oral TID   cinacalcet   30 mg Oral Q supper   droxidopa   200 mg Oral TID with meals   ezetimibe   10 mg Oral QPM   ferric citrate   420 mg Oral TID WC   fludrocortisone   0.2 mg Oral BID   insulin  aspart  0-5 Units Subcutaneous QHS   insulin  aspart  0-6 Units Subcutaneous TID WC   insulin  glargine-yfgn  5 Units Subcutaneous Daily   ipratropium-albuterol   3 mL Nebulization TID   methylPREDNISolone  (SOLU-MEDROL ) injection  40 mg Intravenous Q12H   midodrine   10 mg  Oral TID WC   pantoprazole   40 mg Oral Daily   rosuvastatin   10 mg Oral QPM   acetaminophen  **OR** acetaminophen , chlorpheniramine-HYDROcodone , magnesium  hydroxide, methocarbamol , midodrine , ondansetron  **OR** ondansetron  (ZOFRAN ) IV, oxyCODONE -acetaminophen , traZODone   Assessment/ Plan:  Jacqueline [name] is a 78 y.o.  female with a PMHx of ESRD on HD, diabetes mellitus type 2, chronic hypotension, peripheral arterial disease, obstructive sleep apnea, GERD, obesity, hyperlipidemia, anemia chronic kidney disease, secondary hyperparathyroidism.  Patient presented to the emergency department with multiple complaints from the rehab facility.  Complaints including shoulder pain, abdominal pain, headaches, and vaginal discharge.  Patient has been admitted for COPD exacerbation (HCC) [J44.1] Upper abdominal pain [R10.10] Generalized headache [R51.9] COPD with acute exacerbation (HCC) [J44.1] Closed fracture of multiple ribs of right side, initial encounter [S22.41XA]  [facility] [place]/TTS/Lt AVF/104.6kg  End-stage renal disease on hemodialysis.  Last treatment completed  on Saturday.  Next treatment scheduled for Tuesday.  2. Anemia of chronic kidney disease Lab Results  Component Value Date   HGB 12.0 06/20/2024    Hemoglobin within desired range.  No need for ESA's at this time.  Patient does receive Mircera at outpatient clinic.  3. Secondary Hyperparathyroidism: with outpatient labs: PTH 70, phosphorus 2.1, calcium  8.1 on 05/04/2024.   Lab Results  Component Value Date   PTH 388.6 (H) 01/17/2014   CALCIUM  8.1 (L) 06/20/2024   CAION 0.92 (L) 06/11/2023   PHOS 7.0 (H) 07/01/2023    Calcium  acceptable.  Will obtain updated phosphorus with a.m. labs.  4. Diabetes mellitus type II with chronic kidney disease/renal manifestations: insulin  dependent. Home regimen includes aspart and glargine. Most recent hemoglobin A1c is 6.0.     LOS: 0 Shantella Blubaugh 7/21/20253:32 PM

## 2024-06-21 NOTE — Plan of Care (Signed)
   Problem: Education: Goal: Knowledge of General Education information will improve Description Including pain rating scale, medication(s)/side effects and non-pharmacologic comfort measures Outcome: Progressing

## 2024-06-22 DIAGNOSIS — E1122 Type 2 diabetes mellitus with diabetic chronic kidney disease: Secondary | ICD-10-CM | POA: Diagnosis not present

## 2024-06-22 DIAGNOSIS — J209 Acute bronchitis, unspecified: Secondary | ICD-10-CM | POA: Diagnosis not present

## 2024-06-22 DIAGNOSIS — E875 Hyperkalemia: Secondary | ICD-10-CM

## 2024-06-22 DIAGNOSIS — E669 Obesity, unspecified: Secondary | ICD-10-CM

## 2024-06-22 DIAGNOSIS — E785 Hyperlipidemia, unspecified: Secondary | ICD-10-CM

## 2024-06-22 DIAGNOSIS — N186 End stage renal disease: Secondary | ICD-10-CM | POA: Diagnosis not present

## 2024-06-22 LAB — CBC
HCT: 36.9 % (ref 36.0–46.0)
Hemoglobin: 11.4 g/dL — ABNORMAL LOW (ref 12.0–15.0)
MCH: 32.1 pg (ref 26.0–34.0)
MCHC: 30.9 g/dL (ref 30.0–36.0)
MCV: 103.9 fL — ABNORMAL HIGH (ref 80.0–100.0)
Platelets: 163 K/uL (ref 150–400)
RBC: 3.55 MIL/uL — ABNORMAL LOW (ref 3.87–5.11)
RDW: 15.3 % (ref 11.5–15.5)
WBC: 8.4 K/uL (ref 4.0–10.5)
nRBC: 0 % (ref 0.0–0.2)

## 2024-06-22 LAB — GLUCOSE, CAPILLARY
Glucose-Capillary: 171 mg/dL — ABNORMAL HIGH (ref 70–99)
Glucose-Capillary: 256 mg/dL — ABNORMAL HIGH (ref 70–99)
Glucose-Capillary: 170 mg/dL — ABNORMAL HIGH (ref 70–99)

## 2024-06-22 LAB — BASIC METABOLIC PANEL WITH GFR
Anion gap: 17 — ABNORMAL HIGH (ref 5–15)
BUN: 68 mg/dL — ABNORMAL HIGH (ref 8–23)
CO2: 23 mmol/L (ref 22–32)
Calcium: 7.8 mg/dL — ABNORMAL LOW (ref 8.9–10.3)
Chloride: 91 mmol/L — ABNORMAL LOW (ref 98–111)
Creatinine, Ser: 8.35 mg/dL — ABNORMAL HIGH (ref 0.44–1.00)
GFR, Estimated: 4 mL/min — ABNORMAL LOW (ref >60)
Glucose, Bld: 188 mg/dL — ABNORMAL HIGH (ref 70–99)
Potassium: 7.1 mmol/L (ref 3.5–5.1)
Sodium: 131 mmol/L — ABNORMAL LOW (ref 135–145)

## 2024-06-22 LAB — PHOSPHORUS: Phosphorus: 3.4 mg/dL (ref 2.5–4.6)

## 2024-06-22 MED ORDER — INSULIN ASPART 100 UNIT/ML IJ SOLN
2.0000 [IU] | Freq: Three times a day (TID) | INTRAMUSCULAR | Status: DC
  Administered 2024-06-22 – 2024-06-25 (×7): 2 [IU] via SUBCUTANEOUS
  Filled 2024-06-22 (×4): qty 1

## 2024-06-22 MED ORDER — MIDODRINE HCL 5 MG PO TABS
ORAL_TABLET | ORAL | Status: AC
  Filled 2024-06-22: qty 4

## 2024-06-22 MED ORDER — IPRATROPIUM-ALBUTEROL 0.5-2.5 (3) MG/3ML IN SOLN
3.0000 mL | Freq: Two times a day (BID) | RESPIRATORY_TRACT | Status: DC
  Administered 2024-06-22 – 2024-06-25 (×6): 3 mL via RESPIRATORY_TRACT
  Filled 2024-06-22 (×6): qty 3

## 2024-06-22 MED ORDER — METHYLPREDNISOLONE SODIUM SUCC 40 MG IJ SOLR
40.0000 mg | Freq: Every day | INTRAMUSCULAR | Status: DC
  Administered 2024-06-23 – 2024-06-25 (×3): 40 mg via INTRAVENOUS
  Filled 2024-06-22 (×3): qty 1

## 2024-06-22 NOTE — Assessment & Plan Note (Addendum)
 Continue Solu-Medrol  40 mg daily.  Started on empiric antibiotics.  Will need ENT and pulmonary consultation as outpatient for tracheobronchial stenosis.  Patient states she already has an appointment with pulmonary.  Check pulse ox on room air to see if she qualifies for home oxygen .

## 2024-06-22 NOTE — Discharge Instructions (Signed)
Recommend outpatient sleep study 

## 2024-06-22 NOTE — Assessment & Plan Note (Signed)
 Potassium 7.1.  Patient brought down for urgent dialysis this morning.  Recheck potassium tomorrow morning.

## 2024-06-22 NOTE — Inpatient Diabetes Management (Signed)
 Inpatient Diabetes Program Recommendations  AACE/ADA: New Consensus Statement on Inpatient Glycemic Control (2015)  Target Ranges:  Prepandial:   less than 140 mg/dL      Peak postprandial:   less than 180 mg/dL (1-2 hours)      Critically ill patients:  140 - 180 mg/dL   Lab Results  Component Value Date   GLUCAP 171 (H) 06/22/2024   HGBA1C 6.0 (H) 06/21/2024    Review of Glycemic Control  Latest Reference Range & Units 06/21/24 08:14 06/21/24 11:17 06/21/24 16:30 06/21/24 20:53 06/22/24 07:28  Glucose-Capillary 70 - 99 mg/dL 804 (H) 696 (H) 769 (H) 190 (H) 171 (H)   Diabetes history: DM 2 Outpatient Diabetes medications:  Novolog  0-20 units tid with meals Semglee  5 units daily Current orders for Inpatient glycemic control:  Novolog  0-6 units tid with meals and HS Semglee  5 units daily Inpatient Diabetes Program Recommendations:    May consider adding Novolog  2 units tid with meals (hold if patient eats less than 50% or NPO).   Thanks,  Randall Bullocks, RN, BC-ADM Inpatient Diabetes Coordinator Pager [phone]  (8a-5p)

## 2024-06-22 NOTE — Hospital Course (Signed)
 78 y.o. female with medical history significant of atrial fibrillation on Eliquis , chronic kidney disease on dialysis Tuesdays Thursdays Saturdays, CAD, type 2 diabetes mellitus on insulin , dyslipidemia, heart failure with preserved EF.  Questionable history of COPD and chronic O2 use.  Presented with multiple complaints.  Primary complaint was of pleuritic chest pain on the right with inspiration and shortness of breath.  Reported productive cough although when clarified patient has had significant intermittent productive cough for the past 2 years.  No infectious type symptoms such as fevers or chills.  She has also been having some upper thoracic right-sided back pain as well.  She had significant wheezing upon presentation that responded to multiple DuoNeb treatments.  Workup in the ED included a CT of the chest that revealed mild mosaic attenuation and groundglass opacities in the bilateral lungs.  There was also a finding of narrowing of the trachea and bilateral mainstem bronchus raising possibility of tracheobronchial stenosis.  Incidental finding of nondisplaced right posterolateral 7th and 8th rib fractures.  Patient denied fall or other trauma.  Started on Rocephin  for possible bronchitis.  Hospitalist service has been asked to evaluate the patient for admission   7/22.  Called with high potassium of 7.1.  Nephrology brought down for early dialysis session.  Patient feels better with regards to her breathing.

## 2024-06-22 NOTE — Assessment & Plan Note (Signed)
 Patient on Semglee  insulin  plus sliding scale will add 2 units prior to meals.  Change steroids to once a day dosing

## 2024-06-22 NOTE — Assessment & Plan Note (Signed)
 Class II with BMI 37.64

## 2024-06-22 NOTE — Progress Notes (Signed)
 [facility]  ROUNDING NOTE   Subjective:   Jacqueline [name]  is a 78 y.o.  female  with a PMHx of ESRD on HD, diabetes mellitus type 2, chronic hypotension, peripheral arterial disease, obstructive sleep apnea, GERD, obesity, hyperlipidemia, anemia chronic kidney disease, secondary hyperparathyroidism.  Patient presented to the emergency department with multiple complaints from the rehab facility.  Complaints including shoulder pain, abdominal pain, headaches, and vaginal discharge.  Patient has been admitted for COPD exacerbation (HCC) [J44.1] Upper abdominal pain [R10.10] Generalized headache [R51.9] COPD with acute exacerbation (HCC) [J44.1] Closed fracture of multiple ribs of right side, initial encounter [S22.41XA]  Patient is known to our practice and receives outpatient dialysis treatments at [facility], on a TTS schedule, supervised by [facility] physicians.    Update: Patient seen and evaluated during dialysis   HEMODIALYSIS FLOWSHEET:  Blood Flow Rate (mL/min): 399 mL/min Arterial Pressure (mmHg): -175.55 mmHg Venous Pressure (mmHg): 289.08 mmHg TMP (mmHg): 1.41 mmHg Ultrafiltration Rate (mL/min): 829 mL/min Dialysate Flow Rate (mL/min): 299 ml/min  Resting comfortably during treatment Remains on 2L [place]  Objective:  Vital signs in last 24 hours:  Temp:  [97.6 F (36.4 C)-98.6 F (37 C)] 98 F (36.7 C) (07/22 0800) Pulse Rate:  [59-76] 66 (07/22 0900) Resp:  [15-21] 15 (07/22 0930) BP: (92-133)/(42-67) 99/48 (07/22 0930) SpO2:  [95 %-100 %] 100 % (07/22 0830) Weight:  [102.6 kg] 102.6 kg (07/22 0825)  Weight change:  Filed Weights   06/21/24 0600 06/22/24 0825  Weight: 106.1 kg 102.6 kg    Intake/Output: I/O last 3 completed shifts: In: 1070 [P.O.:720; IV Piggyback:350] Out: -    Intake/Output this shift:  No intake/output data recorded.  Physical Exam: General: NAD  Head: Normocephalic, atraumatic. Moist oral mucosal membranes   Eyes: Anicteric  Neck: Supple  Lungs:  Clear to auscultation, normal effort  Heart: Regular rate and rhythm  Abdomen:  Soft, nontender  Extremities: 2+ peripheral edema.  Neurologic: Awake, alert, conversant  Skin: Warm,dry, no rash  Access: Left aVF    Basic Metabolic Panel: Recent Labs  Lab 06/20/24 2137 06/22/24 0506  NA 136 131*  K 4.7 7.1*  CL 94* 91*  CO2 28 23  GLUCOSE 136* 188*  BUN 46* 68*  CREATININE 5.98* 8.35*  CALCIUM  8.1* 7.8*    Liver Function Tests: Recent Labs  Lab 06/20/24 2137  AST 27  ALT 17  ALKPHOS 143*  BILITOT 0.8  PROT 7.7  ALBUMIN  3.5   Recent Labs  Lab 06/20/24 2137  LIPASE 50   No results for input(s): AMMONIA in the last 168 hours.  CBC: Recent Labs  Lab 06/20/24 2137 06/22/24 0506  WBC 11.0* 8.4  HGB 12.0 11.4*  HCT 39.7 36.9  MCV 105.9* 103.9*  PLT 188 163    Cardiac Enzymes: No results for input(s): CKTOTAL, CKMB, CKMBINDEX, TROPONINI in the last 168 hours.  BNP: Invalid input(s): POCBNP  CBG: Recent Labs  Lab 06/21/24 0814 06/21/24 1117 06/21/24 1630 06/21/24 2053 06/22/24 0728  GLUCAP 195* 303* 230* 190* 171*    Microbiology: Results for orders placed or performed during the hospital encounter of 06/07/23  Blood culture (routine x 2)     Status: None   Collection Time: 06/07/23 12:54 PM   Specimen: BLOOD  Result Value Ref Range Status   Specimen Description BLOOD RIGHT ANTECUBITAL  Final   Special Requests   Final    BOTTLES DRAWN AEROBIC AND ANAEROBIC Blood Culture adequate volume  Culture   Final    NO GROWTH 5 DAYS Performed at [facility], [address] [place] [place], [place] [address]    Report Status 06/12/2023 FINAL  Final  Blood culture (routine x 2)     Status: None   Collection Time: 06/07/23 12:54 PM   Specimen: BLOOD  Result Value Ref Range Status   Specimen Description BLOOD BLOOD RIGHT FOREARM  Final   Special Requests   Final    BOTTLES DRAWN AEROBIC AND  ANAEROBIC Blood Culture adequate volume   Culture   Final    NO GROWTH 5 DAYS Performed at [facility], [address], [place], [place] [address]    Report Status 06/12/2023 FINAL  Final  MRSA Next Gen by PCR, Nasal     Status: None   Collection Time: 06/07/23 10:18 PM   Specimen: Nasal Mucosa; Nasal Swab  Result Value Ref Range Status   MRSA by PCR Next Gen NOT DETECTED NOT DETECTED Final    Comment: (NOTE) The GeneXpert MRSA Assay (FDA approved for NASAL specimens only), is one component of a comprehensive MRSA colonization surveillance program. It is not intended to diagnose MRSA infection nor to guide or monitor treatment for MRSA infections. Test performance is not FDA approved in patients less than 70 years old. Performed at [facility], [address] [place], [place] [address]     Coagulation Studies: No results for input(s): LABPROT, INR in the last 72 hours.  Urinalysis: No results for input(s): COLORURINE, LABSPEC, PHURINE, GLUCOSEU, HGBUR, BILIRUBINUR, KETONESUR, PROTEINUR, UROBILINOGEN, NITRITE, LEUKOCYTESUR in the last 72 hours.  Invalid input(s): APPERANCEUR    Imaging:    Medications:    azithromycin  500 mg (06/21/24 1037)   cefTRIAXone  (ROCEPHIN )  IV 1 g (06/22/24 0631)    allopurinol   100 mg Oral Daily   apixaban   2.5 mg Oral BID   benzonatate   100 mg Oral TID   Chlorhexidine  Gluconate Cloth  6 each Topical Q0600   cinacalcet   30 mg Oral Q supper   droxidopa   200 mg Oral TID with meals   ezetimibe   10 mg Oral QPM   ferric citrate   420 mg Oral TID WC   fludrocortisone   0.2 mg Oral BID   insulin  aspart  0-5 Units Subcutaneous QHS   insulin  aspart  0-6 Units Subcutaneous TID WC   insulin  glargine-yfgn  5 Units Subcutaneous Daily   ipratropium-albuterol   3 mL Nebulization TID   methylPREDNISolone  (SOLU-MEDROL ) injection  40 mg Intravenous Q12H   midodrine   10 mg Oral TID WC   pantoprazole   40 mg  Oral Daily   rosuvastatin   10 mg Oral QPM   acetaminophen  **OR** acetaminophen , chlorpheniramine-HYDROcodone , magnesium  hydroxide, methocarbamol , midodrine , ondansetron  **OR** ondansetron  (ZOFRAN ) IV, oxyCODONE -acetaminophen , traZODone   Assessment/ Plan:  Jacqueline [name] is a 78 y.o.  female with a PMHx of ESRD on HD, diabetes mellitus type 2, chronic hypotension, peripheral arterial disease, obstructive sleep apnea, GERD, obesity, hyperlipidemia, anemia chronic kidney disease, secondary hyperparathyroidism.  Patient presented to the emergency department with multiple complaints from the rehab facility.  Complaints including shoulder pain, abdominal pain, headaches, and vaginal discharge.  Patient has been admitted for COPD exacerbation (HCC) [J44.1] Upper abdominal pain [R10.10] Generalized headache [R51.9] COPD with acute exacerbation (HCC) [J44.1] Closed fracture of multiple ribs of right side, initial encounter [S22.41XA]  [facility] [place]/TTS/Lt AVF/104.6kg  End-stage renal disease with hyperkalemia on hemodialysis.  Last treatment completed on Saturday.  Potassium 7.1 today. Will  provide scheduled dialysis on 1K bath to manage. Next treatment scheduled for Thursday.   2. Anemia of chronic kidney disease Lab Results  Component Value Date   HGB 11.4 (L) 06/22/2024    Hemoglobin at goal.  Patient does receive Mircera at outpatient clinic.  3. Secondary Hyperparathyroidism: with outpatient labs: PTH 70, phosphorus 2.1, calcium  8.1 on 05/04/2024.   Lab Results  Component Value Date   PTH 388.6 (H) 01/17/2014   CALCIUM  7.8 (L) 06/22/2024   CAION 0.92 (L) 06/11/2023   PHOS 7.0 (H) 07/01/2023    Calcium  acceptable.  Awaiting updated phosphorus level  4. Diabetes mellitus type II with chronic kidney disease/renal manifestations: insulin  dependent. Home regimen includes aspart and glargine. Most recent hemoglobin A1c is 6.0.     LOS: 1 Jabria Loos 7/22/20259:42 AM

## 2024-06-22 NOTE — Assessment & Plan Note (Signed)
 On Crestor

## 2024-06-22 NOTE — Progress Notes (Signed)
 Progress Note   Patient: Jacqueline [name] FMW:[id number] DOB: 07-Feb-1946 DOA: 06/20/2024     1 DOS: the patient was seen and examined on 06/22/2024   Brief hospital course: 78 y.o. female with medical history significant of atrial fibrillation on Eliquis , chronic kidney disease on dialysis Tuesdays Thursdays Saturdays, CAD, type 2 diabetes mellitus on insulin , dyslipidemia, heart failure with preserved EF.  Questionable history of COPD and chronic O2 use.  Presented with multiple complaints.  Primary complaint was of pleuritic chest pain on the right with inspiration and shortness of breath.  Reported productive cough although when clarified patient has had significant intermittent productive cough for the past 2 years.  No infectious type symptoms such as fevers or chills.  She has also been having some upper thoracic right-sided back pain as well.  She had significant wheezing upon presentation that responded to multiple DuoNeb treatments.  Workup in the ED included a CT of the chest that revealed mild mosaic attenuation and groundglass opacities in the bilateral lungs.  There was also a finding of narrowing of the trachea and bilateral mainstem bronchus raising possibility of tracheobronchial stenosis.  Incidental finding of nondisplaced right posterolateral 7th and 8th rib fractures.  Patient denied fall or other trauma.  Started on Rocephin  for possible bronchitis.  Hospitalist service has been asked to evaluate the patient for admission   7/22.  Called with high potassium of 7.1.  Nephrology brought down for early dialysis session.  Patient feels better with regards to her breathing.  Assessment and Plan: Hyperkalemia Potassium 7.1.  Patient brought down for urgent dialysis this morning.  Recheck potassium tomorrow morning.  Acute bronchitis Continue Solu-Medrol  40 mg daily.  Started on empiric antibiotics.  Will need ENT and pulmonary consultation as outpatient for tracheobronchial stenosis.   Patient states she already has an appointment with pulmonary.  Check pulse ox on room air to see if she qualifies for home oxygen .  ESRD (end stage renal disease) (HCC) Urgent dialysis today (which is her usual day) secondary to high potassium.  Type 2 diabetes mellitus with end-stage renal disease (HCC) Patient on Semglee  insulin  plus sliding scale will add 2 units prior to meals.  Change steroids to once a day dosing  Atrial fibrillation, chronic (HCC) Continue Eliquis  for anticoagulation  Obesity (BMI 30-39.9) Class II with BMI 37.64  Hyperlipidemia, unspecified On Crestor         Subjective: Patient feeling better with regards to her breathing.  Called urgently to see her secondary to hyperkalemia.  Potassium 7.1 on labs today.  Patient will be brought urgently for dialysis  Physical Exam: Vitals:   06/22/24 1100 06/22/24 1138 06/22/24 1155 06/22/24 1159  BP: (!) 123/54 (!) 118/55 (!) 115/56 112/60  Pulse:   76 77  Resp: 13 18 13 15   Temp:    (!) 97.5 F (36.4 C)  TempSrc:      SpO2:   100% 96%  Weight:      Height:       Physical Exam HENT:     Head: Normocephalic.     Mouth/Throat:     Pharynx: No oropharyngeal exudate.  Eyes:     General: Lids are normal.     Conjunctiva/sclera: Conjunctivae normal.  Cardiovascular:     Rate and Rhythm: Normal rate and regular rhythm.     Heart sounds: Normal heart sounds, S1 normal and S2 normal.  Pulmonary:     Breath sounds: Examination of the right-lower field reveals decreased breath sounds.  Examination of the left-lower field reveals decreased breath sounds. Decreased breath sounds present. No wheezing, rhonchi or rales.  Abdominal:     Palpations: Abdomen is soft.     Tenderness: There is no abdominal tenderness.  Musculoskeletal:     Right lower leg: No swelling.     Left lower leg: No swelling.  Skin:    General: Skin is warm.     Findings: No rash.  Neurological:     Mental Status: She is alert and oriented  to person, place, and time.     Data Reviewed: Sodium 131, potassium 7.1, creatinine 8.35, GFR 4, anion gap 17, white blood cell count 8.4, hemoglobin 11.4, platelet count 163  Family Communication: Left message for son  Disposition: Status is: Inpatient Remains inpatient appropriate because: Urgent treatment for hyperkalemia today recheck potassium tomorrow  Planned Discharge Destination: Long-term care    Time spent: 28 minutes  Author: Charlie Patterson, MD 06/22/2024 12:34 PM  For on call review [email or web address].

## 2024-06-22 NOTE — Progress Notes (Signed)
 PT Cancellation Note  Patient Details Name: Jacqueline [name] MRN: [id number] DOB: [birth year]   Cancelled Treatment:    Reason Eval/Treat Not Completed: Patient at procedure or test/unavailable, will attempt to see pt at a future date/time as medically appropriate.     CHARM [name] PT, DPT 06/22/24, 9:06 AM

## 2024-06-22 NOTE — Assessment & Plan Note (Signed)
 Urgent dialysis today (which is her usual day) secondary to high potassium.

## 2024-06-22 NOTE — Assessment & Plan Note (Signed)
-  Continue Eliquis for anticoagulation

## 2024-06-22 NOTE — Progress Notes (Signed)
 PT Cancellation Note  Patient Details Name: Jacqueline [name] MRN: [id number] DOB: Oct 20, 1946   Cancelled Treatment:    Reason Eval/Treat Not Completed: Medical issues which prohibited therapy: Pt's most recent K 7.1 falling outside guidelines for participation with PT services.  Pt now s/p HD but will wait for follow up labs to ensure that K has returned to acceptable level prior to initiating exertional activity.     CHARM [name] PT, DPT 06/22/24, 2:37 PM

## 2024-06-23 DIAGNOSIS — J209 Acute bronchitis, unspecified: Secondary | ICD-10-CM | POA: Diagnosis not present

## 2024-06-23 DIAGNOSIS — N186 End stage renal disease: Secondary | ICD-10-CM | POA: Diagnosis not present

## 2024-06-23 DIAGNOSIS — E875 Hyperkalemia: Secondary | ICD-10-CM | POA: Diagnosis not present

## 2024-06-23 DIAGNOSIS — E1122 Type 2 diabetes mellitus with diabetic chronic kidney disease: Secondary | ICD-10-CM | POA: Diagnosis not present

## 2024-06-23 LAB — BASIC METABOLIC PANEL WITH GFR
Anion gap: 13 (ref 5–15)
Anion gap: 18 — ABNORMAL HIGH (ref 5–15)
BUN: 45 mg/dL — ABNORMAL HIGH (ref 8–23)
BUN: 50 mg/dL — ABNORMAL HIGH (ref 8–23)
CO2: 28 mmol/L (ref 22–32)
CO2: 28 mmol/L (ref 22–32)
Calcium: 7.9 mg/dL — ABNORMAL LOW (ref 8.9–10.3)
Calcium: 8.1 mg/dL — ABNORMAL LOW (ref 8.9–10.3)
Chloride: 86 mmol/L — ABNORMAL LOW (ref 98–111)
Chloride: 89 mmol/L — ABNORMAL LOW (ref 98–111)
Creatinine, Ser: 5.54 mg/dL — ABNORMAL HIGH (ref 0.44–1.00)
Creatinine, Ser: 5.54 mg/dL — ABNORMAL HIGH (ref 0.44–1.00)
GFR, Estimated: 7 mL/min — ABNORMAL LOW (ref >60)
GFR, Estimated: 7 mL/min — ABNORMAL LOW (ref >60)
Glucose, Bld: 207 mg/dL — ABNORMAL HIGH (ref 70–99)
Glucose, Bld: 209 mg/dL — ABNORMAL HIGH (ref 70–99)
Potassium: 4.5 mmol/L (ref 3.5–5.1)
Potassium: 4.8 mmol/L (ref 3.5–5.1)
Sodium: 130 mmol/L — ABNORMAL LOW (ref 135–145)
Sodium: 132 mmol/L — ABNORMAL LOW (ref 135–145)

## 2024-06-23 LAB — CBC
HCT: 36.4 % (ref 36.0–46.0)
Hemoglobin: 11.1 g/dL — ABNORMAL LOW (ref 12.0–15.0)
MCH: 31.7 pg (ref 26.0–34.0)
MCHC: 30.5 g/dL (ref 30.0–36.0)
MCV: 104 fL — ABNORMAL HIGH (ref 80.0–100.0)
Platelets: 169 K/uL (ref 150–400)
RBC: 3.5 MIL/uL — ABNORMAL LOW (ref 3.87–5.11)
RDW: 15.4 % (ref 11.5–15.5)
WBC: 11.8 K/uL — ABNORMAL HIGH (ref 4.0–10.5)
nRBC: 0 % (ref 0.0–0.2)

## 2024-06-23 LAB — GLUCOSE, CAPILLARY
Glucose-Capillary: 184 mg/dL — ABNORMAL HIGH (ref 70–99)
Glucose-Capillary: 210 mg/dL — ABNORMAL HIGH (ref 70–99)
Glucose-Capillary: 170 mg/dL — ABNORMAL HIGH (ref 70–99)
Glucose-Capillary: 205 mg/dL — ABNORMAL HIGH (ref 70–99)

## 2024-06-23 NOTE — Hospital Course (Signed)
 78 y.o. female with medical history significant of atrial fibrillation on Eliquis , ESRD- HD TTS, CAD, type 2 diabetes mellitus on insulin , dyslipidemia, heart failure with preserved EF, Questionable history of COPD and chronic O2 use - presenting with acute bronchitis and hyperkalemia.   Assessment and Plan:   Acute bronchitis - Continues to show improvement.  Patient still complaining of some intermittent coughing and shortness of breath however markedly improved since presentation.  Continue Solu-Medrol  40 mg daily.  Nebulizers on board.  Provide pulmonary/ENT consultation as outpatient on discharge for noted tracheobronchial stenosis   Hyperkalemia - Yesterday potassium 7.1 prompting urgent dialysis.  Recheck this morning noting correction down to 4.3.  Appears resolved at this time.   ESRD - Nephrology following closely.  HD on TTS schedule.   Diabetes mellitus - Continue long-acting insulin  with insulin  sliding scale.  Likely exacerbated by Solu-Medrol .   Chronic atrial fibrillation - Continue Eliquis    Physical debilitation and muscle weakness - Evaluated by PT and recommending STR.  Will work closely with TOC on disposition planning.

## 2024-06-23 NOTE — Evaluation (Signed)
 Physical Therapy Evaluation Patient Details Name: Jacqueline [name] MRN: [id number] DOB: May 06, 1946 Today's Date: 06/23/2024  History of Present Illness  Pt is a 78 y.o. female with PMH of pulmonary hypertension on oxygen  chronically, COPD (per patient), coronary artery disease, end-stage renal disease on hemodialysis, hypertension, hyperlipidemia, diabetes, atrial fibrillation on Eliquis  who presented to the emergency department feeling short of breath and pain in the right side of her chestand right upper abdomen. MD assessment includes chronic hypoxemia in context of COPD, posterolateral rib fractures, chronic atrial fibrillation, and CAD.  Clinical Impression  Pt was pleasant and motivated to participate during the session and put forth fair effort throughout. Pt required physical assistance to complete STS from elevated surface, but CGA for short bout of ambulation. VC's were utilized for hand placement before initiating STS. Ambulation distance was self-selected by pt, due to fatigue. Pt's SpO2% was WNL before and after bed mobility on 1L/min, so pt was put on room air for 2 minutes, where SpO2% decreased to 89% in sitting, so pt was placed back on 1L/min before ambulation. Pt remained steady throughout ambulation and no LOBs occurred. Pt reported no adverse symptoms during the session with SpO2 and HR WNL throughout on 1L/min. SpO2% reading was unable to be obtained during ambulation, due to poor pleth. Nursing made aware of pt being left on 1L/min at conclusion of session. Pt will benefit from continued PT services upon discharge to safely address deficits listed in patient problem list for decreased caregiver assistance and eventual return to PLOF.           If plan is discharge home, recommend the following: A little help with walking and/or transfers;A lot of help with bathing/dressing/bathroom;Assistance with cooking/housework;Assist for transportation   Can travel by private vehicle    Yes    Equipment Recommendations Other (comment) (TBD at next venue of care)  Recommendations for Other Services       Functional Status Assessment Patient has had a recent decline in their functional status and demonstrates the ability to make significant improvements in function in a reasonable and predictable amount of time.     Precautions / Restrictions Precautions Precautions: None Restrictions Weight Bearing Restrictions Per Provider Order: No      Mobility  Bed Mobility Overal bed mobility: Modified Independent             General bed mobility comments: Pt required extra time/effort and use of bedrails for all bed mobility tasks, but no physical assistance required    Transfers Overall transfer level: Needs assistance Equipment used: Rolling walker (2 wheels) Transfers: Sit to/from Stand Sit to Stand: Mod assist, From elevated surface           General transfer comment: Pt required modA to complete STS from elevated EOB with RW to prevent posterior LOB    Ambulation/Gait Ambulation/Gait assistance: Contact guard assist Gait Distance (Feet): 3 Feet Assistive device: Rolling walker (2 wheels) Gait Pattern/deviations: Step-through pattern, Decreased step length - right, Decreased step length - left, Trunk flexed Gait velocity: decreased     General Gait Details: Pt demonstrated flexed trunk over RW and decreased step length bilaterally with ambulation, but remained steady throughout. Ambulation distance was self-selected by pt, due to fatigue.  Stairs            Wheelchair Mobility     Tilt Bed    Modified Rankin (Stroke Patients Only)       Balance Overall balance assessment: Needs assistance Sitting-balance support: No  upper extremity supported, Feet supported Sitting balance-Leahy Scale: Fair     Standing balance support: Bilateral upper extremity supported, During functional activity, Reliant on assistive device for  balance Standing balance-Leahy Scale: Poor Standing balance comment: Pt heavily reliant on RW to maintain balance in stance and ambulation, but remained steady throughout                             Pertinent Vitals/Pain Pain Assessment Pain Assessment: No/denies pain    Home Living Family/patient expects to be discharged to:: Skilled nursing facility Living Arrangements: Alone                 Additional Comments: Patient is from long term care [facility] at [facility]    Prior Function Prior Level of Function : Needs assist       Physical Assist : Mobility (physical);ADLs (physical) Mobility (physical): Bed mobility;Transfers;Gait ADLs (physical): Grooming;Bathing;Dressing;Toileting Mobility Comments: Pt reported being modI with RW in her room at [facility]. Pt reported 0 falls in the past 6 months. ADLs Comments: Pt reported receiving assistance with all ADLs at [facility].     Extremity/Trunk Assessment   Upper Extremity Assessment Upper Extremity Assessment: Generalized weakness    Lower Extremity Assessment Lower Extremity Assessment: Generalized weakness       Communication   Communication Communication: No apparent difficulties    Cognition Arousal: Alert Behavior During Therapy: WFL for tasks assessed/performed   PT - Cognitive impairments: No apparent impairments                         Following commands: Intact       Cueing Cueing Techniques: Verbal cues, Gestural cues     General Comments      Exercises     Assessment/Plan    PT Assessment Patient needs continued PT services  PT Problem List Decreased strength;Decreased mobility;Decreased range of motion;Decreased coordination;Decreased activity tolerance;Cardiopulmonary status limiting activity;Decreased balance       PT Treatment Interventions DME instruction;Therapeutic exercise;Gait training;Balance training;Functional mobility  training;Therapeutic activities;Patient/family education    PT Goals (Current goals can be found in the Care Plan section)  Acute Rehab PT Goals Patient Stated Goal: to be able to walk without AD PT Goal Formulation: With patient Time For Goal Achievement: 07/06/24 Potential to Achieve Goals: Good    Frequency Min 2X/week     Co-evaluation               AM-PAC PT 6 Clicks Mobility  Outcome Measure Help needed turning from your back to your side while in a flat bed without using bedrails?: A Little Help needed moving from lying on your back to sitting on the side of a flat bed without using bedrails?: A Little Help needed moving to and from a bed to a chair (including a wheelchair)?: A Little Help needed standing up from a chair using your arms (e.g., wheelchair or bedside chair)?: A Little Help needed to walk in hospital room?: A Lot Help needed climbing 3-5 steps with a railing? : Total 6 Click Score: 15    End of Session Equipment Utilized During Treatment: Gait belt Activity Tolerance: Patient limited by fatigue Patient left: in chair;with call bell/phone within reach;with chair alarm set Nurse Communication: Mobility status; nursing staff aware pt was left on 1L/min PT Visit Diagnosis: Other abnormalities of gait and mobility (R26.89);Muscle weakness (generalized) (M62.81)    Time: [phone]  PT Time Calculation (min) (ACUTE ONLY): 43 min   Charges:                 Leontine Ingles, SPT 06/23/24, 12:49 PM

## 2024-06-23 NOTE — Progress Notes (Signed)
 Progress Note   Patient: Jacqueline [name] FMW:[id number] DOB: Sep 05, 1946 DOA: 06/20/2024  DOS: the patient was seen and examined on 06/23/2024   Brief hospital course:  78 y.o. female with medical history significant of atrial fibrillation on Eliquis , ESRD- HD TTS, CAD, type 2 diabetes mellitus on insulin , dyslipidemia, heart failure with preserved EF, Questionable history of COPD and chronic O2 use - presenting with acute bronchitis and hyperkalemia.  Assessment and Plan:  Acute bronchitis - Continues to show improvement.  Patient still complaining of some intermittent coughing and shortness of breath however markedly improved since presentation.  Continue Solu-Medrol  40 mg daily.  Nebulizers on board.  Provide pulmonary/ENT consultation as outpatient on discharge for noted tracheobronchial stenosis  Hyperkalemia - Yesterday potassium 7.1 prompting urgent dialysis.  Recheck this morning noting correction down to 4.3.  Appears resolved at this time.  ESRD - Nephrology following closely.  HD on TTS schedule.  Diabetes mellitus - Continue long-acting insulin  with insulin  sliding scale.  Likely exacerbated by Solu-Medrol .  Chronic atrial fibrillation - Continue Eliquis   Physical debilitation and muscle weakness - Evaluated by PT and recommending STR.  Will work closely with TOC on disposition planning.  Subjective: Patient sitting up at bedside, physical therapy beginning to work with patient this morning.  Patient states she is still having some coughing is not ready to be discharged this morning.  Labs look improved and her breathing is improved but still having dry cough.  Denies any fever, purulent sputum, nausea, vomiting, abdominal pain, chest pain.  Physical Exam:  Vitals:   06/22/24 2009 06/23/24 0225 06/23/24 0734 06/23/24 0803  BP:  (!) 109/50  (!) 112/53  Pulse:  72  70  Resp:  18  16  Temp:  98.3 F (36.8 C)  97.9 F (36.6 C)  TempSrc:    Oral  SpO2: 94% 100% 99% 95%   Weight:      Height:        GENERAL:  Alert, pleasant, no acute distress, obese HEENT:  EOMI CARDIOVASCULAR:  RRR, no murmurs appreciated RESPIRATORY: Poor air movement bilaterally GASTROINTESTINAL:  Soft, nontender, nondistended EXTREMITIES:  No LE edema bilaterally NEURO:  No new focal deficits appreciated SKIN:  No rashes noted PSYCH:  Appropriate mood and affect     Data Reviewed:  Imaging Studies: CT Angio Chest PE W and/or Wo Contrast Addendum Date: 06/21/2024 ** ADDENDUM #1 ** ADDENDUM: Also noted are nondisplaced right posterolateral 7th and 8th rib fractures. No pneumothorax. ---------------------------------------------------- Electronically signed by: Pinkie Pebbles MD 06/21/2024 01:10 AM EDT RP Workstation: [id number]   Result Date: 06/21/2024 ** ORIGINAL REPORT ** EXAM: CTA CHEST PE WITH CONTRAST CT ABDOMEN AND PELVIS WITH CONTRAST 06/21/2024 12:49:21 AM TECHNIQUE: CTA of the chest was performed after the administration of intravenous contrast. Multiplanar reformatted images are provided for review. MIP images are provided for review. CT of the abdomen and pelvis was performed with the administration of intravenous contrast. Automated exposure control, iterative reconstruction, and/or weight based adjustment of the mA/kV was utilized to reduce the radiation dose to as low as reasonably achievable. COMPARISON: CT chest dated 08/09/2023 and CT abdomen/pelvis dated 06/07/2023. CLINICAL HISTORY: Pulmonary embolism (PE) suspected, high prob. Pt reports that she has right sided abdominal pain x 1 year and left shoulder pain for a long time and vaginal discharge x 1 year, as well as intermittent headaches x 2 years. When asked why she had staff call EMS tonight, she says because she wants to find out what's going  on and because she had a bad episode of abdominal pain last night. Pt is alert and oriented x 4. FINDINGS: CHEST: PULMONARY ARTERIES: Pulmonary arteries are adequately  opacified for evaluation. No evidence of pulmonary embolism. MEDIASTINUM: Cardiomegaly with left cardiomediastinal shift. Moderate 3-vessel coronary atherosclerosis. No mediastinal lymphadenopathy. LUNGS AND PLEURA: Mild mosaic attenuation / ground-glass opacity in the lungs bilaterally. Mild atelectasis in the lingula and bilateral lower lobes. No pleural effusion or pneumothorax. AIRWAYS: Narrowing of the trachea and bilateral mainstem bronchi (image 52), raising the possibility of tracheobronchial stenosis. SOFT TISSUES AND BONES: No acute bone or soft tissue abnormality. Mild degenerative changes of the visualized thoracolumbar spine. ABDOMEN AND PELVIS: LIVER: The liver is unremarkable. GALLBLADDER AND BILE DUCTS: Gallbladder is unremarkable. No biliary ductal dilatation. SPLEEN: Spleen demonstrates no acute abnormality. PANCREAS: Pancreas demonstrates no acute abnormality. ADRENAL GLANDS: Adrenal glands demonstrate no acute abnormality. KIDNEYS, URETERS AND BLADDER: Bilateral renal atrophy. Simple bilateral renal cysts, including a dominant 9.5 cm exophytic left upper pole cyst (image 37), benign (Bosniak 1). No stones in the kidneys or ureters. No hydronephrosis. No perinephric or periureteral stranding. Urinary bladder is decompressed and poorly visualized. GI AND BOWEL: Normal appendix (image 68). Stomach and duodenal sweep demonstrate no acute abnormality. There is no bowel obstruction. No abnormal bowel wall thickening or distension. REPRODUCTIVE: Status post hysterectomy. PERITONEUM AND RETROPERITONEUM: No ascites or free air. LYMPH NODES: No lymphadenopathy. BONES AND SOFT TISSUES: No acute abnormality of the visualized bones. No focal soft tissue abnormality. VASCULATURE: Atherosclerotic calcifications of the abdominal aorta and branch vessels, although patent. Although not tailored for evaluation of the thoracic aorta, there is no evidence of thoracic aortic aneurysm or dissection. Thoracic  atherosclerosis. IMPRESSION: 1. No pulmonary embolism. 2. Narrowing of the trachea and bilateral mainstem bronchi, raising the possibility of tracheobronchial stenosis. 3. No acute findings in the abdomen/pelvis. Electronically signed by: Pinkie Pebbles MD 06/21/2024 01:04 AM EDT RP Workstation: [id number]   CT ABDOMEN PELVIS W CONTRAST Addendum Date: 06/21/2024 ** ADDENDUM #1 ** ADDENDUM: Also noted are nondisplaced right posterolateral 7th and 8th rib fractures. No pneumothorax. ---------------------------------------------------- Electronically signed by: Pinkie Pebbles MD 06/21/2024 01:10 AM EDT RP Workstation: [id number]   Result Date: 06/21/2024 ** ORIGINAL REPORT ** EXAM: CTA CHEST PE WITH CONTRAST CT ABDOMEN AND PELVIS WITH CONTRAST 06/21/2024 12:49:21 AM TECHNIQUE: CTA of the chest was performed after the administration of intravenous contrast. Multiplanar reformatted images are provided for review. MIP images are provided for review. CT of the abdomen and pelvis was performed with the administration of intravenous contrast. Automated exposure control, iterative reconstruction, and/or weight based adjustment of the mA/kV was utilized to reduce the radiation dose to as low as reasonably achievable. COMPARISON: CT chest dated 08/09/2023 and CT abdomen/pelvis dated 06/07/2023. CLINICAL HISTORY: Pulmonary embolism (PE) suspected, high prob. Pt reports that she has right sided abdominal pain x 1 year and left shoulder pain for a long time and vaginal discharge x 1 year, as well as intermittent headaches x 2 years. When asked why she had staff call EMS tonight, she says because she wants to find out what's going on and because she had a bad episode of abdominal pain last night. Pt is alert and oriented x 4. FINDINGS: CHEST: PULMONARY ARTERIES: Pulmonary arteries are adequately opacified for evaluation. No evidence of pulmonary embolism. MEDIASTINUM: Cardiomegaly with left cardiomediastinal shift.  Moderate 3-vessel coronary atherosclerosis. No mediastinal lymphadenopathy. LUNGS AND PLEURA: Mild mosaic attenuation / ground-glass opacity in the lungs bilaterally. Mild atelectasis in  the lingula and bilateral lower lobes. No pleural effusion or pneumothorax. AIRWAYS: Narrowing of the trachea and bilateral mainstem bronchi (image 52), raising the possibility of tracheobronchial stenosis. SOFT TISSUES AND BONES: No acute bone or soft tissue abnormality. Mild degenerative changes of the visualized thoracolumbar spine. ABDOMEN AND PELVIS: LIVER: The liver is unremarkable. GALLBLADDER AND BILE DUCTS: Gallbladder is unremarkable. No biliary ductal dilatation. SPLEEN: Spleen demonstrates no acute abnormality. PANCREAS: Pancreas demonstrates no acute abnormality. ADRENAL GLANDS: Adrenal glands demonstrate no acute abnormality. KIDNEYS, URETERS AND BLADDER: Bilateral renal atrophy. Simple bilateral renal cysts, including a dominant 9.5 cm exophytic left upper pole cyst (image 37), benign (Bosniak 1). No stones in the kidneys or ureters. No hydronephrosis. No perinephric or periureteral stranding. Urinary bladder is decompressed and poorly visualized. GI AND BOWEL: Normal appendix (image 68). Stomach and duodenal sweep demonstrate no acute abnormality. There is no bowel obstruction. No abnormal bowel wall thickening or distension. REPRODUCTIVE: Status post hysterectomy. PERITONEUM AND RETROPERITONEUM: No ascites or free air. LYMPH NODES: No lymphadenopathy. BONES AND SOFT TISSUES: No acute abnormality of the visualized bones. No focal soft tissue abnormality. VASCULATURE: Atherosclerotic calcifications of the abdominal aorta and branch vessels, although patent. Although not tailored for evaluation of the thoracic aorta, there is no evidence of thoracic aortic aneurysm or dissection. Thoracic atherosclerosis. IMPRESSION: 1. No pulmonary embolism. 2. Narrowing of the trachea and bilateral mainstem bronchi, raising the  possibility of tracheobronchial stenosis. 3. No acute findings in the abdomen/pelvis. Electronically signed by: Pinkie Pebbles MD 06/21/2024 01:04 AM EDT RP Workstation: [id number]   CT HEAD WO CONTRAST ( ) Result Date: 06/21/2024 EXAM: CT HEAD WITHOUT CONTRAST 06/21/2024 12:49:21 AM TECHNIQUE: CT of the head was performed without the administration of intravenous contrast. Automated exposure control, iterative reconstruction, and/or weight based adjustment of the mA/kV was utilized to reduce the radiation dose to as low as reasonably achievable. COMPARISON: 09/30/2023 CLINICAL HISTORY: Headache, new onset (Age >= 51y). Pt reports that she has right sided abdominal pain x 1 year and left shoulder pain for a long time and vaginal discharge x 1 year, as well as intermittent headaches x 2 years. When asked why she had staff call EMS tonight, she says because she wants to find out what's going on and because she had a bad episode of abdominal pain last night. Pt is alert and oriented x 4. FINDINGS: BRAIN AND VENTRICLES: No acute hemorrhage. Gray-white differentiation is preserved. No hydrocephalus. No extra-axial collection. No mass effect or midline shift. Mild chronic ischemic white matter changes. ORBITS: No acute abnormality. SINUSES: Chronic right maxillary sinusitis. Bilateral mastoid effusions. SOFT TISSUES AND SKULL: No acute soft tissue abnormality. No skull fracture. Calcific atherosclerosis of the vertebral and carotid arteries at the skull base. IMPRESSION: 1. No acute intracranial abnormality. 2. Mild chronic ischemic white matter changes. 3. Chronic right maxillary sinusitis and bilateral mastoid effusions. Electronically signed by: Franky Stanford MD 06/21/2024 12:53 AM EDT RP Workstation: [id number]   US  ABDOMEN LIMITED RUQ (LIVER/GB) Result Date: 06/20/2024 CLINICAL DATA:  [id number] Upper abdominal pain [id number] EXAM: ULTRASOUND ABDOMEN LIMITED RIGHT UPPER QUADRANT COMPARISON:  CT abdomen pelvis  06/07/2023 FINDINGS: Gallbladder: No definite gallstones. Gallbladder wall thickening. No pericholecystic fluid. No sonographic Murphy sign noted by sonographer. Common bile duct: Diameter: 4 mm. Liver: No focal lesion identified. Increased parenchymal echogenicity. Portal vein is patent on color Doppler imaging with normal direction of blood flow towards the liver. Other: None. IMPRESSION: 1. Nonspecific gallbladder wall thickening. Finding can be seen in the setting  of chronic liver disease. Correlate with liver function tests. 2. Hepatic steatosis. Please note limited evaluation for focal hepatic masses in a patient with hepatic steatosis due to decreased penetration of the acoustic ultrasound waves. Electronically Signed   By: Morgane  Naveau M.D.   On: 06/20/2024 23:58    There are no new results to review at this time.  Previous records (including but not limited to H&P, progress notes, nursing notes, TOC management) were reviewed in assessment of this patient.  Labs: CBC: Recent Labs  Lab 06/20/24 2137 06/22/24 0506 06/23/24 0430  WBC 11.0* 8.4 11.8*  HGB 12.0 11.4* 11.1*  HCT 39.7 36.9 36.4  MCV 105.9* 103.9* 104.0*  PLT 188 163 169   Basic Metabolic Panel: Recent Labs  Lab 06/20/24 2137 06/22/24 0506 06/22/24 2359 06/23/24 0430  NA 136 131* 132* 130*  K 4.7 7.1* 4.8 4.5  CL 94* 91* 86* 89*  CO2 28 23 28 28   GLUCOSE 136* 188* 207* 209*  BUN 46* 68* 45* 50*  CREATININE 5.98* 8.35* 5.54* 5.54*  CALCIUM  8.1* 7.8* 8.1* 7.9*  PHOS  --  3.4  --   --    Liver Function Tests: Recent Labs  Lab 06/20/24 2137  AST 27  ALT 17  ALKPHOS 143*  BILITOT 0.8  PROT 7.7  ALBUMIN  3.5   CBG: Recent Labs  Lab 06/22/24 0728 06/22/24 1649 06/22/24 2146 06/23/24 0802 06/23/24 1132  GLUCAP 171* 256* 170* 184* 210*    Scheduled Meds:  allopurinol   100 mg Oral Daily   apixaban   2.5 mg Oral BID   benzonatate   100 mg Oral TID   Chlorhexidine  Gluconate Cloth  6 each Topical  Q0600   cinacalcet   30 mg Oral Q supper   droxidopa   200 mg Oral TID with meals   ezetimibe   10 mg Oral QPM   ferric citrate   420 mg Oral TID WC   fludrocortisone   0.2 mg Oral BID   insulin  aspart  0-5 Units Subcutaneous QHS   insulin  aspart  0-6 Units Subcutaneous TID WC   insulin  aspart  2 Units Subcutaneous TID WC   insulin  glargine-yfgn  5 Units Subcutaneous Daily   ipratropium-albuterol   3 mL Nebulization BID   methylPREDNISolone  (SOLU-MEDROL ) injection  40 mg Intravenous Daily   midodrine   10 mg Oral TID WC   pantoprazole   40 mg Oral Daily   rosuvastatin   10 mg Oral QPM   Continuous Infusions:  azithromycin  500 mg (06/23/24 0917)   cefTRIAXone  (ROCEPHIN )  IV 1 g (06/23/24 0608)   PRN Meds:.acetaminophen  **OR** acetaminophen , chlorpheniramine-HYDROcodone , magnesium  hydroxide, methocarbamol , midodrine , ondansetron  **OR** ondansetron  (ZOFRAN ) IV, oxyCODONE -acetaminophen , traZODone   Family Communication: None at bedside  Disposition: Status is: Inpatient Remains inpatient appropriate because: ESRD, acute bronchitis     Time spent: 40 minutes  Length of inpatient stay: 2 days  Author: Carliss LELON Canales, DO 06/23/2024 12:52 PM  For on call review [email or web address].

## 2024-06-23 NOTE — Progress Notes (Signed)
 [facility]  ROUNDING NOTE   Subjective:   Jacqueline [name]  is a 79 y.o.  female  with a PMHx of ESRD on HD, diabetes mellitus type 2, chronic hypotension, peripheral arterial disease, obstructive sleep apnea, GERD, obesity, hyperlipidemia, anemia chronic kidney disease, secondary hyperparathyroidism.  Patient presented to the emergency department with multiple complaints from the rehab facility.  Complaints including shoulder pain, abdominal pain, headaches, and vaginal discharge.  Patient has been admitted for COPD exacerbation (HCC) [J44.1] Upper abdominal pain [R10.10] Generalized headache [R51.9] COPD with acute exacerbation (HCC) [J44.1] Closed fracture of multiple ribs of right side, initial encounter [S22.41XA]  Patient is known to our practice and receives outpatient dialysis treatments at [facility], on a TTS schedule, supervised by [facility] physicians.    Update: Patient seen sitting up in bed Alert and oriented Currently eating breakfast Remains on room air Denies shortness of breath  Objective:  Vital signs in last 24 hours:  Temp:  [97.9 F (36.6 C)-98.3 F (36.8 C)] 97.9 F (36.6 C) (07/23 0803) Pulse Rate:  [70-82] 70 (07/23 0803) Resp:  [16-18] 16 (07/23 0803) BP: (109-122)/(50-54) 112/53 (07/23 0803) SpO2:  [94 %-100 %] 95 % (07/23 0803)  Weight change:  Filed Weights   06/21/24 0600 06/22/24 0825  Weight: 106.1 kg 102.6 kg    Intake/Output: I/O last 3 completed shifts: In: 590 [P.O.:240; IV Piggyback:350] Out: 1800 [Other:1800]   Intake/Output this shift:  Total I/O In: 240 [P.O.:240] Out: -   Physical Exam: General: NAD  Head: Normocephalic, atraumatic. Moist oral mucosal membranes  Eyes: Anicteric  Neck: Supple  Lungs:  Clear to auscultation, normal effort  Heart: Regular rate and rhythm  Abdomen:  Soft, nontender  Extremities: 2+ peripheral edema.  Neurologic: Awake, alert, conversant  Skin: Warm,dry, no rash   Access: Left aVF    Basic Metabolic Panel: Recent Labs  Lab 06/20/24 2137 06/22/24 0506 06/22/24 2359 06/23/24 0430  NA 136 131* 132* 130*  K 4.7 7.1* 4.8 4.5  CL 94* 91* 86* 89*  CO2 28 23 28 28   GLUCOSE 136* 188* 207* 209*  BUN 46* 68* 45* 50*  CREATININE 5.98* 8.35* 5.54* 5.54*  CALCIUM  8.1* 7.8* 8.1* 7.9*  PHOS  --  3.4  --   --     Liver Function Tests: Recent Labs  Lab 06/20/24 2137  AST 27  ALT 17  ALKPHOS 143*  BILITOT 0.8  PROT 7.7  ALBUMIN  3.5   Recent Labs  Lab 06/20/24 2137  LIPASE 50   No results for input(s): AMMONIA in the last 168 hours.  CBC: Recent Labs  Lab 06/20/24 2137 06/22/24 0506 06/23/24 0430  WBC 11.0* 8.4 11.8*  HGB 12.0 11.4* 11.1*  HCT 39.7 36.9 36.4  MCV 105.9* 103.9* 104.0*  PLT 188 163 169    Cardiac Enzymes: No results for input(s): CKTOTAL, CKMB, CKMBINDEX, TROPONINI in the last 168 hours.  BNP: Invalid input(s): POCBNP  CBG: Recent Labs  Lab 06/22/24 0728 06/22/24 1649 06/22/24 2146 06/23/24 0802 06/23/24 1132  GLUCAP 171* 256* 170* 184* 210*    Microbiology: Results for orders placed or performed during the hospital encounter of 06/07/23  Blood culture (routine x 2)     Status: None   Collection Time: 06/07/23 12:54 PM   Specimen: BLOOD  Result Value Ref Range Status   Specimen Description BLOOD RIGHT ANTECUBITAL  Final   Special Requests   Final    BOTTLES DRAWN AEROBIC AND ANAEROBIC Blood  Culture adequate volume   Culture   Final    NO GROWTH 5 DAYS Performed at [facility], [address] [place], [place] [address]    Report Status 06/12/2023 FINAL  Final  Blood culture (routine x 2)     Status: None   Collection Time: 06/07/23 12:54 PM   Specimen: BLOOD  Result Value Ref Range Status   Specimen Description BLOOD BLOOD RIGHT FOREARM  Final   Special Requests   Final    BOTTLES DRAWN AEROBIC AND ANAEROBIC Blood Culture adequate volume   Culture   Final    NO  GROWTH 5 DAYS Performed at [facility], [address], [place], [place] [address]    Report Status 06/12/2023 FINAL  Final  MRSA Next Gen by PCR, Nasal     Status: None   Collection Time: 06/07/23 10:18 PM   Specimen: Nasal Mucosa; Nasal Swab  Result Value Ref Range Status   MRSA by PCR Next Gen NOT DETECTED NOT DETECTED Final    Comment: (NOTE) The GeneXpert MRSA Assay (FDA approved for NASAL specimens only), is one component of a comprehensive MRSA colonization surveillance program. It is not intended to diagnose MRSA infection nor to guide or monitor treatment for MRSA infections. Test performance is not FDA approved in patients less than 51 years old. Performed at [facility], [address] [place], [place] [address]     Coagulation Studies: No results for input(s): LABPROT, INR in the last 72 hours.  Urinalysis: No results for input(s): COLORURINE, LABSPEC, PHURINE, GLUCOSEU, HGBUR, BILIRUBINUR, KETONESUR, PROTEINUR, UROBILINOGEN, NITRITE, LEUKOCYTESUR in the last 72 hours.  Invalid input(s): APPERANCEUR    Imaging:    Medications:    azithromycin  500 mg (06/23/24 0917)   cefTRIAXone  (ROCEPHIN )  IV 1 g (06/23/24 9391)    allopurinol   100 mg Oral Daily   apixaban   2.5 mg Oral BID   benzonatate   100 mg Oral TID   Chlorhexidine  Gluconate Cloth  6 each Topical Q0600   cinacalcet   30 mg Oral Q supper   droxidopa   200 mg Oral TID with meals   ezetimibe   10 mg Oral QPM   ferric citrate   420 mg Oral TID WC   fludrocortisone   0.2 mg Oral BID   insulin  aspart  0-5 Units Subcutaneous QHS   insulin  aspart  0-6 Units Subcutaneous TID WC   insulin  aspart  2 Units Subcutaneous TID WC   insulin  glargine-yfgn  5 Units Subcutaneous Daily   ipratropium-albuterol   3 mL Nebulization BID   methylPREDNISolone  (SOLU-MEDROL ) injection  40 mg Intravenous Daily   midodrine   10 mg Oral TID WC   pantoprazole   40 mg Oral Daily    rosuvastatin   10 mg Oral QPM   acetaminophen  **OR** acetaminophen , chlorpheniramine-HYDROcodone , magnesium  hydroxide, methocarbamol , midodrine , ondansetron  **OR** ondansetron  (ZOFRAN ) IV, oxyCODONE -acetaminophen , traZODone   Assessment/ Plan:  [name] is a 78 y.o.  female with a PMHx of ESRD on HD, diabetes mellitus type 2, chronic hypotension, peripheral arterial disease, obstructive sleep apnea, GERD, obesity, hyperlipidemia, anemia chronic kidney disease, secondary hyperparathyroidism.  Patient presented to the emergency department with multiple complaints from the rehab facility.  Complaints including shoulder pain, abdominal pain, headaches, and vaginal discharge.  Patient has been admitted for COPD exacerbation (HCC) [J44.1] Upper abdominal pain [R10.10] Generalized headache [R51.9] COPD with acute exacerbation (HCC) [J44.1] Closed fracture of multiple ribs of right side, initial encounter [S22.41XA]  [facility]/TTS/Lt AVF/104.6kg  End-stage renal disease  with hyperkalemia on hemodialysis.  Dialysis received yesterday, UF 1.8 L achieved.  Next treatment scheduled for Thursday.   2. Anemia of chronic kidney disease Lab Results  Component Value Date   HGB 11.1 (L) 06/23/2024    Hemoglobin acceptable for renal patient.  Patient does receive Mircera at outpatient clinic.  No need for inpatient ESA at this time.  3. Secondary Hyperparathyroidism: with outpatient labs: PTH 70, phosphorus 2.1, calcium  8.1 on 05/04/2024.   Lab Results  Component Value Date   PTH 388.6 (H) 01/17/2014   CALCIUM  7.9 (L) 06/23/2024   CAION 0.92 (L) 06/11/2023   PHOS 3.4 06/22/2024    Calcium  slightly decreased however phosphorus acceptable.  Continue Cinacalcet .  4. Diabetes mellitus type II with chronic kidney disease/renal manifestations: insulin  dependent. Home regimen includes aspart and glargine. Most recent hemoglobin A1c is 6.0.     LOS: 2 Jacqueline [name] 7/23/20251:06  PM

## 2024-06-23 NOTE — Progress Notes (Signed)
   06/22/24 1159  Vitals  Temp (!) 97.5 F (36.4 C) (AXILLARY)  BP 112/60  MAP (mmHg) 76  Pulse Rate 77  ECG Heart Rate 78  Resp 15  Type of Weight Post-Dialysis  Oxygen  Therapy  SpO2 96 %  O2 Device Nasal Cannula  During Treatment Monitoring  Blood Flow Rate (mL/min) 0 mL/min  Arterial Pressure (mmHg) -16.36 mmHg  Venous Pressure (mmHg) 8.28 mmHg  TMP (mmHg) 10.91 mmHg  Ultrafiltration Rate (mL/min) 1811 mL/min  Dialysate Flow Rate (mL/min) 300 ml/min  Dialysate Potassium Concentration 1  Dialysate Calcium  Concentration 2.5  Duration of HD Treatment -hour(s) 3.5 hour(s)  Cumulative Fluid Removed (mL) per Treatment  1800.3  Post Treatment  Dialyzer Clearance Clear  Liters Processed 84  Fluid Removed (mL) 1800 mL  Tolerated HD Treatment Yes  Post-Hemodialysis Comments 1800 ML REMOVED  AVG/AVF Arterial Site Held (minutes) 5 minutes  AVG/AVF Venous Site Held (minutes) 5 minutes  Fistula / Graft Left Upper arm  No placement date or time found.   Placed prior to admission: Yes  Orientation: Left  Access Location: Upper arm  Site Condition No complications  Fistula / Graft Assessment Present;Thrill;Bruit (Hemostasis has been achieved. no verbalized concerns.)  Status Deaccessed;Patent  Drainage Description None

## 2024-06-24 LAB — BASIC METABOLIC PANEL WITH GFR
Anion gap: 17 — ABNORMAL HIGH (ref 5–15)
BUN: 79 mg/dL — ABNORMAL HIGH (ref 8–23)
CO2: 27 mmol/L (ref 22–32)
Calcium: 7.6 mg/dL — ABNORMAL LOW (ref 8.9–10.3)
Chloride: 86 mmol/L — ABNORMAL LOW (ref 98–111)
Creatinine, Ser: 7.39 mg/dL — ABNORMAL HIGH (ref 0.44–1.00)
GFR, Estimated: 5 mL/min — ABNORMAL LOW (ref >60)
Glucose, Bld: 190 mg/dL — ABNORMAL HIGH (ref 70–99)
Potassium: 5 mmol/L (ref 3.5–5.1)
Sodium: 130 mmol/L — ABNORMAL LOW (ref 135–145)

## 2024-06-24 LAB — GLUCOSE, CAPILLARY
Glucose-Capillary: 146 mg/dL — ABNORMAL HIGH (ref 70–99)
Glucose-Capillary: 219 mg/dL — ABNORMAL HIGH (ref 70–99)
Glucose-Capillary: 269 mg/dL — ABNORMAL HIGH (ref 70–99)

## 2024-06-24 LAB — CBC
HCT: 37.3 % (ref 36.0–46.0)
Hemoglobin: 11.6 g/dL — ABNORMAL LOW (ref 12.0–15.0)
MCH: 32.3 pg (ref 26.0–34.0)
MCHC: 31.1 g/dL (ref 30.0–36.0)
MCV: 103.9 fL — ABNORMAL HIGH (ref 80.0–100.0)
Platelets: 164 K/uL (ref 150–400)
RBC: 3.59 MIL/uL — ABNORMAL LOW (ref 3.87–5.11)
RDW: 15.4 % (ref 11.5–15.5)
WBC: 9.9 K/uL (ref 4.0–10.5)
nRBC: 0 % (ref 0.0–0.2)

## 2024-06-24 LAB — MAGNESIUM: Magnesium: 1.8 mg/dL (ref 1.7–2.4)

## 2024-06-24 LAB — PHOSPHORUS: Phosphorus: 5.3 mg/dL — ABNORMAL HIGH (ref 2.5–4.6)

## 2024-06-24 MED ORDER — BISACODYL 10 MG RE SUPP
10.0000 mg | Freq: Every day | RECTAL | Status: DC | PRN
  Administered 2024-06-25: 10 mg via RECTAL
  Filled 2024-06-24: qty 1

## 2024-06-24 MED ORDER — BISACODYL 5 MG PO TBEC: 5.0000 mg | DELAYED_RELEASE_TABLET | Freq: Every day | ORAL | Status: DC | PRN

## 2024-06-24 MED ORDER — VITAMIN D 25 MCG (1000 UNIT) PO TABS
1000.0000 [IU] | ORAL_TABLET | Freq: Every day | ORAL | Status: DC
  Administered 2024-06-24 – 2024-06-25 (×2): 1000 [IU] via ORAL
  Filled 2024-06-24 (×3): qty 1

## 2024-06-24 MED ORDER — FLEET ENEMA RE ENEM: 1.0000 | ENEMA | Freq: Every day | RECTAL | Status: DC | PRN

## 2024-06-24 MED ORDER — POLYETHYLENE GLYCOL 3350 17 G PO PACK
17.0000 g | PACK | Freq: Every day | ORAL | Status: DC
  Filled 2024-06-24: qty 1

## 2024-06-24 MED ORDER — BISACODYL 10 MG RE SUPP: 10.0000 mg | Freq: Every day | RECTAL | Status: DC | PRN

## 2024-06-24 MED ORDER — BISACODYL 5 MG PO TBEC
5.0000 mg | DELAYED_RELEASE_TABLET | Freq: Every day | ORAL | Status: DC
  Administered 2024-06-24 – 2024-06-25 (×2): 5 mg via ORAL
  Filled 2024-06-24 (×2): qty 1

## 2024-06-24 NOTE — Progress Notes (Signed)
 [facility]  ROUNDING NOTE   Subjective:   Jacqueline [name]  is a 78 y.o.  female  with a PMHx of ESRD on HD, diabetes mellitus type 2, chronic hypotension, peripheral arterial disease, obstructive sleep apnea, GERD, obesity, hyperlipidemia, anemia chronic kidney disease, secondary hyperparathyroidism.  Patient presented to the emergency department with multiple complaints from the rehab facility.  Complaints including shoulder pain, abdominal pain, headaches, and vaginal discharge.  Patient has been admitted for COPD exacerbation (HCC) [J44.1] Upper abdominal pain [R10.10] Generalized headache [R51.9] COPD with acute exacerbation (HCC) [J44.1] Closed fracture of multiple ribs of right side, initial encounter [S22.41XA]  Patient is known to our practice and receives outpatient dialysis treatments at [facility], on a TTS schedule, supervised by [facility] physicians.    Update: Patient seen and evaluated during dialysis   HEMODIALYSIS FLOWSHEET:  Blood Flow Rate (mL/min): 400 mL/min Arterial Pressure (mmHg): -182.21 mmHg Venous Pressure (mmHg): 255.95 mmHg TMP (mmHg): 1.41 mmHg Ultrafiltration Rate (mL/min): 828 mL/min Dialysate Flow Rate (mL/min): 299 ml/min  No complaints to offer Tolerating treatment well  Objective:  Vital signs in last 24 hours:  Temp:  [97.7 F (36.5 C)-98 F (36.7 C)] 97.9 F (36.6 C) (07/24 0801) Pulse Rate:  [56-71] 62 (07/24 1130) Resp:  [12-17] 13 (07/24 1130) BP: (122-142)/(51-85) 131/70 (07/24 1130) SpO2:  [95 %-100 %] 100 % (07/24 1130) Weight:  [107.2 kg] 107.2 kg (07/24 0801)  Weight change:  Filed Weights   06/21/24 0600 06/22/24 0825 06/24/24 0801  Weight: 106.1 kg 102.6 kg 107.2 kg    Intake/Output: I/O last 3 completed shifts: In: 240 [P.O.:240] Out: -    Intake/Output this shift:  No intake/output data recorded.  Physical Exam: General: NAD  Head: Normocephalic, atraumatic. Moist oral mucosal membranes   Eyes: Anicteric  Neck: Supple  Lungs:  Clear to auscultation, normal effort  Heart: Regular rate and rhythm  Abdomen:  Soft, nontender  Extremities: 1+ peripheral edema.  Neurologic: Awake, alert, conversant  Skin: Warm,dry, no rash  Access: Left aVF    Basic Metabolic Panel: Recent Labs  Lab 06/20/24 2137 06/22/24 0506 06/22/24 2359 06/23/24 0430 06/24/24 0528  NA 136 131* 132* 130* 130*  K 4.7 7.1* 4.8 4.5 5.0  CL 94* 91* 86* 89* 86*  CO2 28 23 28 28 27   GLUCOSE 136* 188* 207* 209* 190*  BUN 46* 68* 45* 50* 79*  CREATININE 5.98* 8.35* 5.54* 5.54* 7.39*  CALCIUM  8.1* 7.8* 8.1* 7.9* 7.6*  MG  --   --   --   --  1.8  PHOS  --  3.4  --   --  5.3*    Liver Function Tests: Recent Labs  Lab 06/20/24 2137  AST 27  ALT 17  ALKPHOS 143*  BILITOT 0.8  PROT 7.7  ALBUMIN  3.5   Recent Labs  Lab 06/20/24 2137  LIPASE 50   No results for input(s): AMMONIA in the last 168 hours.  CBC: Recent Labs  Lab 06/20/24 2137 06/22/24 0506 06/23/24 0430 06/24/24 0528  WBC 11.0* 8.4 11.8* 9.9  HGB 12.0 11.4* 11.1* 11.6*  HCT 39.7 36.9 36.4 37.3  MCV 105.9* 103.9* 104.0* 103.9*  PLT 188 163 169 164    Cardiac Enzymes: No results for input(s): CKTOTAL, CKMB, CKMBINDEX, TROPONINI in the last 168 hours.  BNP: Invalid input(s): POCBNP  CBG: Recent Labs  Lab 06/22/24 2146 06/23/24 0802 06/23/24 1132 06/23/24 1632 06/23/24 2057  GLUCAP 170* 184* 210* 170* 205*  Microbiology: Results for orders placed or performed during the hospital encounter of 06/07/23  Blood culture (routine x 2)     Status: None   Collection Time: 06/07/23 12:54 PM   Specimen: BLOOD  Result Value Ref Range Status   Specimen Description BLOOD RIGHT ANTECUBITAL  Final   Special Requests   Final    BOTTLES DRAWN AEROBIC AND ANAEROBIC Blood Culture adequate volume   Culture   Final    NO GROWTH 5 DAYS Performed at [facility], [address] [place], [place]  [address]    Report Status 06/12/2023 FINAL  Final  Blood culture (routine x 2)     Status: None   Collection Time: 06/07/23 12:54 PM   Specimen: BLOOD  Result Value Ref Range Status   Specimen Description BLOOD BLOOD RIGHT FOREARM  Final   Special Requests   Final    BOTTLES DRAWN AEROBIC AND ANAEROBIC Blood Culture adequate volume   Culture   Final    NO GROWTH 5 DAYS Performed at [facility], [address], [place], [place] [address]    Report Status 06/12/2023 FINAL  Final  MRSA Next Gen by PCR, Nasal     Status: None   Collection Time: 06/07/23 10:18 PM   Specimen: Nasal Mucosa; Nasal Swab  Result Value Ref Range Status   MRSA by PCR Next Gen NOT DETECTED NOT DETECTED Final    Comment: (NOTE) The GeneXpert MRSA Assay (FDA approved for NASAL specimens only), is one component of a comprehensive MRSA colonization surveillance program. It is not intended to diagnose MRSA infection nor to guide or monitor treatment for MRSA infections. Test performance is not FDA approved in patients less than 62 years old. Performed at [facility], [address] [place], [place] [address]     Coagulation Studies: No results for input(s): LABPROT, INR in the last 72 hours.  Urinalysis: No results for input(s): COLORURINE, LABSPEC, PHURINE, GLUCOSEU, HGBUR, BILIRUBINUR, KETONESUR, PROTEINUR, UROBILINOGEN, NITRITE, LEUKOCYTESUR in the last 72 hours.  Invalid input(s): APPERANCEUR    Imaging:    Medications:    cefTRIAXone  (ROCEPHIN )  IV 1 g (06/24/24 9361)    allopurinol   100 mg Oral Daily   apixaban   2.5 mg Oral BID   benzonatate   100 mg Oral TID   Chlorhexidine  Gluconate Cloth  6 each Topical Q0600   cinacalcet   30 mg Oral Q supper   droxidopa   200 mg Oral TID with meals   ezetimibe   10 mg Oral QPM   ferric citrate   420 mg Oral TID WC   fludrocortisone   0.2 mg Oral BID   insulin  aspart  0-5 Units Subcutaneous QHS   insulin   aspart  0-6 Units Subcutaneous TID WC   insulin  aspart  2 Units Subcutaneous TID WC   insulin  glargine-yfgn  5 Units Subcutaneous Daily   ipratropium-albuterol   3 mL Nebulization BID   methylPREDNISolone  (SOLU-MEDROL ) injection  40 mg Intravenous Daily   midodrine   10 mg Oral TID WC   pantoprazole   40 mg Oral Daily   rosuvastatin   10 mg Oral QPM   acetaminophen  **OR** acetaminophen , chlorpheniramine-HYDROcodone , magnesium  hydroxide, methocarbamol , midodrine , ondansetron  **OR** ondansetron  (ZOFRAN ) IV, oxyCODONE -acetaminophen , traZODone   Assessment/ Plan:  Ms. CASSIDEY BARRALES is a 78 y.o.  female with a PMHx of ESRD on HD, diabetes mellitus type 2, chronic hypotension, peripheral arterial disease, obstructive sleep apnea, GERD, obesity, hyperlipidemia, anemia chronic kidney disease, secondary hyperparathyroidism.  Patient presented to the emergency department with  multiple complaints from the rehab facility.  Complaints including shoulder pain, abdominal pain, headaches, and vaginal discharge.  Patient has been admitted for COPD exacerbation (HCC) [J44.1] Upper abdominal pain [R10.10] Generalized headache [R51.9] COPD with acute exacerbation (HCC) [J44.1] Closed fracture of multiple ribs of right side, initial encounter [S22.41XA]  [facility]/TTS/Lt AVF/104.6kg  End-stage renal disease with hyperkalemia on hemodialysis. Receiving dialysis today, UF 1-2L as tolerated. Next treatment scheduled for Saturday  2. Anemia of chronic kidney disease Lab Results  Component Value Date   HGB 11.6 (L) 06/24/2024    Hemoglobin within desired range.  Patient does receive Mircera at outpatient clinic.    3. Secondary Hyperparathyroidism: with outpatient labs: PTH 70, phosphorus 2.1, calcium  8.1 on 05/04/2024.   Lab Results  Component Value Date   PTH 388.6 (H) 01/17/2014   CALCIUM  7.6 (L) 06/24/2024   CAION 0.92 (L) 06/11/2023   PHOS 5.3 (H) 06/24/2024    Calcium  remains decreased, will  restart cholecalciferol .  Continue Cinacalcet .  4. Diabetes mellitus type II with chronic kidney disease/renal manifestations: insulin  dependent. Home regimen includes aspart and glargine. Most recent hemoglobin A1c is 6.0.     LOS: 3 Nashly Olsson 7/24/202511:38 AM

## 2024-06-24 NOTE — Progress Notes (Signed)
 Progress Note   Patient: Jacqueline [name] FMW:[id number] DOB: 12/16/1945 DOA: 06/20/2024  DOS: the patient was seen and examined on 06/24/2024   Brief hospital course:  78 y.o. female with medical history significant of atrial fibrillation on Eliquis , ESRD- HD TTS, CAD, type 2 diabetes mellitus on insulin , dyslipidemia, heart failure with preserved EF, Questionable history of COPD and chronic O2 use - presenting with acute bronchitis and hyperkalemia.   Assessment and Plan:   Acute bronchitis - Continues to show improvement.  Improved intermittent coughing and shortness of breath on exertion.  Continue Solu-Medrol  40 mg daily, plan to transition to p.o. prednisone on discharge.  Nebulizers on board.  Provide pulmonary/ENT consultation as outpatient on discharge for noted tracheobronchial stenosis   Hyperkalemia - potassium 7.1 on 7/22 prompting urgent dialysis.  Recheck noting correction down to 4.3.  Appears resolved at this time.  Dialysis today.   ESRD - Nephrology following closely.  HD on TTS schedule.   Diabetes mellitus - Continue long-acting insulin  with insulin  sliding scale.  Likely exacerbated by Solu-Medrol .   Chronic atrial fibrillation - Continue Eliquis    Physical debilitation and muscle weakness - Evaluated by PT and recommending STR.  Will work closely with TOC on disposition planning.  Anticipate discharge tomorrow.   Subjective: Patient resting comfortably this morning having come back from dialysis.  Denies any fever, chills, chest pain, nausea, vomiting, abdominal pain.  Still has some shortness of breath on exertion but improved from presentation.  Dry cough improved as well.  Has some constipation.  Physical Exam:  Vitals:   06/24/24 1130 06/24/24 1138 06/24/24 1153 06/24/24 1225  BP: 131/70 (!) 142/72  136/68  Pulse: 62   71  Resp: 13 13  18   Temp:  (!) 97.5 F (36.4 C)  97.7 F (36.5 C)  TempSrc:  Oral  Oral  SpO2: 100% (!) 9%  100%  Weight:   105.3  kg   Height:        GENERAL:  Alert, pleasant, no acute distress  HEENT:  EOMI CARDIOVASCULAR:  RRR, no murmurs appreciated RESPIRATORY: Poor air movement bilaterally GASTROINTESTINAL:  Soft, nontender, nondistended EXTREMITIES:  No LE edema bilaterally NEURO:  No new focal deficits appreciated SKIN:  No rashes noted PSYCH:  Appropriate mood and affect     Data Reviewed:  Imaging Studies: CT Angio Chest PE W and/or Wo Contrast Addendum Date: 06/21/2024 ** ADDENDUM #1 ** ADDENDUM: Also noted are nondisplaced right posterolateral 7th and 8th rib fractures. No pneumothorax. ---------------------------------------------------- Electronically signed by: Pinkie Pebbles MD 06/21/2024 01:10 AM EDT RP Workstation: [id number]   Result Date: 06/21/2024 ** ORIGINAL REPORT ** EXAM: CTA CHEST PE WITH CONTRAST CT ABDOMEN AND PELVIS WITH CONTRAST 06/21/2024 12:49:21 AM TECHNIQUE: CTA of the chest was performed after the administration of intravenous contrast. Multiplanar reformatted images are provided for review. MIP images are provided for review. CT of the abdomen and pelvis was performed with the administration of intravenous contrast. Automated exposure control, iterative reconstruction, and/or weight based adjustment of the mA/kV was utilized to reduce the radiation dose to as low as reasonably achievable. COMPARISON: CT chest dated 08/09/2023 and CT abdomen/pelvis dated 06/07/2023. CLINICAL HISTORY: Pulmonary embolism (PE) suspected, high prob. Pt reports that she has right sided abdominal pain x 1 year and left shoulder pain for a long time and vaginal discharge x 1 year, as well as intermittent headaches x 2 years. When asked why she had staff call EMS tonight, she says because she wants to  find out what's going on and because she had a bad episode of abdominal pain last night. Pt is alert and oriented x 4. FINDINGS: CHEST: PULMONARY ARTERIES: Pulmonary arteries are adequately opacified for  evaluation. No evidence of pulmonary embolism. MEDIASTINUM: Cardiomegaly with left cardiomediastinal shift. Moderate 3-vessel coronary atherosclerosis. No mediastinal lymphadenopathy. LUNGS AND PLEURA: Mild mosaic attenuation / ground-glass opacity in the lungs bilaterally. Mild atelectasis in the lingula and bilateral lower lobes. No pleural effusion or pneumothorax. AIRWAYS: Narrowing of the trachea and bilateral mainstem bronchi (image 52), raising the possibility of tracheobronchial stenosis. SOFT TISSUES AND BONES: No acute bone or soft tissue abnormality. Mild degenerative changes of the visualized thoracolumbar spine. ABDOMEN AND PELVIS: LIVER: The liver is unremarkable. GALLBLADDER AND BILE DUCTS: Gallbladder is unremarkable. No biliary ductal dilatation. SPLEEN: Spleen demonstrates no acute abnormality. PANCREAS: Pancreas demonstrates no acute abnormality. ADRENAL GLANDS: Adrenal glands demonstrate no acute abnormality. KIDNEYS, URETERS AND BLADDER: Bilateral renal atrophy. Simple bilateral renal cysts, including a dominant 9.5 cm exophytic left upper pole cyst (image 37), benign (Bosniak 1). No stones in the kidneys or ureters. No hydronephrosis. No perinephric or periureteral stranding. Urinary bladder is decompressed and poorly visualized. GI AND BOWEL: Normal appendix (image 68). Stomach and duodenal sweep demonstrate no acute abnormality. There is no bowel obstruction. No abnormal bowel wall thickening or distension. REPRODUCTIVE: Status post hysterectomy. PERITONEUM AND RETROPERITONEUM: No ascites or free air. LYMPH NODES: No lymphadenopathy. BONES AND SOFT TISSUES: No acute abnormality of the visualized bones. No focal soft tissue abnormality. VASCULATURE: Atherosclerotic calcifications of the abdominal aorta and branch vessels, although patent. Although not tailored for evaluation of the thoracic aorta, there is no evidence of thoracic aortic aneurysm or dissection. Thoracic atherosclerosis.  IMPRESSION: 1. No pulmonary embolism. 2. Narrowing of the trachea and bilateral mainstem bronchi, raising the possibility of tracheobronchial stenosis. 3. No acute findings in the abdomen/pelvis. Electronically signed by: Pinkie Pebbles MD 06/21/2024 01:04 AM EDT RP Workstation: [id number]   CT ABDOMEN PELVIS W CONTRAST Addendum Date: 06/21/2024 ** ADDENDUM #1 ** ADDENDUM: Also noted are nondisplaced right posterolateral 7th and 8th rib fractures. No pneumothorax. ---------------------------------------------------- Electronically signed by: Pinkie Pebbles MD 06/21/2024 01:10 AM EDT RP Workstation: [id number]   Result Date: 06/21/2024 ** ORIGINAL REPORT ** EXAM: CTA CHEST PE WITH CONTRAST CT ABDOMEN AND PELVIS WITH CONTRAST 06/21/2024 12:49:21 AM TECHNIQUE: CTA of the chest was performed after the administration of intravenous contrast. Multiplanar reformatted images are provided for review. MIP images are provided for review. CT of the abdomen and pelvis was performed with the administration of intravenous contrast. Automated exposure control, iterative reconstruction, and/or weight based adjustment of the mA/kV was utilized to reduce the radiation dose to as low as reasonably achievable. COMPARISON: CT chest dated 08/09/2023 and CT abdomen/pelvis dated 06/07/2023. CLINICAL HISTORY: Pulmonary embolism (PE) suspected, high prob. Pt reports that she has right sided abdominal pain x 1 year and left shoulder pain for a long time and vaginal discharge x 1 year, as well as intermittent headaches x 2 years. When asked why she had staff call EMS tonight, she says because she wants to find out what's going on and because she had a bad episode of abdominal pain last night. Pt is alert and oriented x 4. FINDINGS: CHEST: PULMONARY ARTERIES: Pulmonary arteries are adequately opacified for evaluation. No evidence of pulmonary embolism. MEDIASTINUM: Cardiomegaly with left cardiomediastinal shift. Moderate 3-vessel  coronary atherosclerosis. No mediastinal lymphadenopathy. LUNGS AND PLEURA: Mild mosaic attenuation / ground-glass opacity in the lungs  bilaterally. Mild atelectasis in the lingula and bilateral lower lobes. No pleural effusion or pneumothorax. AIRWAYS: Narrowing of the trachea and bilateral mainstem bronchi (image 52), raising the possibility of tracheobronchial stenosis. SOFT TISSUES AND BONES: No acute bone or soft tissue abnormality. Mild degenerative changes of the visualized thoracolumbar spine. ABDOMEN AND PELVIS: LIVER: The liver is unremarkable. GALLBLADDER AND BILE DUCTS: Gallbladder is unremarkable. No biliary ductal dilatation. SPLEEN: Spleen demonstrates no acute abnormality. PANCREAS: Pancreas demonstrates no acute abnormality. ADRENAL GLANDS: Adrenal glands demonstrate no acute abnormality. KIDNEYS, URETERS AND BLADDER: Bilateral renal atrophy. Simple bilateral renal cysts, including a dominant 9.5 cm exophytic left upper pole cyst (image 37), benign (Bosniak 1). No stones in the kidneys or ureters. No hydronephrosis. No perinephric or periureteral stranding. Urinary bladder is decompressed and poorly visualized. GI AND BOWEL: Normal appendix (image 68). Stomach and duodenal sweep demonstrate no acute abnormality. There is no bowel obstruction. No abnormal bowel wall thickening or distension. REPRODUCTIVE: Status post hysterectomy. PERITONEUM AND RETROPERITONEUM: No ascites or free air. LYMPH NODES: No lymphadenopathy. BONES AND SOFT TISSUES: No acute abnormality of the visualized bones. No focal soft tissue abnormality. VASCULATURE: Atherosclerotic calcifications of the abdominal aorta and branch vessels, although patent. Although not tailored for evaluation of the thoracic aorta, there is no evidence of thoracic aortic aneurysm or dissection. Thoracic atherosclerosis. IMPRESSION: 1. No pulmonary embolism. 2. Narrowing of the trachea and bilateral mainstem bronchi, raising the possibility of  tracheobronchial stenosis. 3. No acute findings in the abdomen/pelvis. Electronically signed by: Pinkie Pebbles MD 06/21/2024 01:04 AM EDT RP Workstation: [id number]   CT HEAD WO CONTRAST ( ) Result Date: 06/21/2024 EXAM: CT HEAD WITHOUT CONTRAST 06/21/2024 12:49:21 AM TECHNIQUE: CT of the head was performed without the administration of intravenous contrast. Automated exposure control, iterative reconstruction, and/or weight based adjustment of the mA/kV was utilized to reduce the radiation dose to as low as reasonably achievable. COMPARISON: 09/30/2023 CLINICAL HISTORY: Headache, new onset (Age >= 51y). Pt reports that she has right sided abdominal pain x 1 year and left shoulder pain for a long time and vaginal discharge x 1 year, as well as intermittent headaches x 2 years. When asked why she had staff call EMS tonight, she says because she wants to find out what's going on and because she had a bad episode of abdominal pain last night. Pt is alert and oriented x 4. FINDINGS: BRAIN AND VENTRICLES: No acute hemorrhage. Gray-white differentiation is preserved. No hydrocephalus. No extra-axial collection. No mass effect or midline shift. Mild chronic ischemic white matter changes. ORBITS: No acute abnormality. SINUSES: Chronic right maxillary sinusitis. Bilateral mastoid effusions. SOFT TISSUES AND SKULL: No acute soft tissue abnormality. No skull fracture. Calcific atherosclerosis of the vertebral and carotid arteries at the skull base. IMPRESSION: 1. No acute intracranial abnormality. 2. Mild chronic ischemic white matter changes. 3. Chronic right maxillary sinusitis and bilateral mastoid effusions. Electronically signed by: Franky Stanford MD 06/21/2024 12:53 AM EDT RP Workstation: [id number]   US  ABDOMEN LIMITED RUQ (LIVER/GB) Result Date: 06/20/2024 CLINICAL DATA:  [id number] Upper abdominal pain [id number] EXAM: ULTRASOUND ABDOMEN LIMITED RIGHT UPPER QUADRANT COMPARISON:  CT abdomen pelvis 06/07/2023  FINDINGS: Gallbladder: No definite gallstones. Gallbladder wall thickening. No pericholecystic fluid. No sonographic Murphy sign noted by sonographer. Common bile duct: Diameter: 4 mm. Liver: No focal lesion identified. Increased parenchymal echogenicity. Portal vein is patent on color Doppler imaging with normal direction of blood flow towards the liver. Other: None. IMPRESSION: 1. Nonspecific gallbladder wall thickening. Finding can be  seen in the setting of chronic liver disease. Correlate with liver function tests. 2. Hepatic steatosis. Please note limited evaluation for focal hepatic masses in a patient with hepatic steatosis due to decreased penetration of the acoustic ultrasound waves. Electronically Signed   By: Morgane  Naveau M.D.   On: 06/20/2024 23:58    There are no new results to review at this time.  Previous records (including but not limited to H&P, progress notes, nursing notes, TOC management) were reviewed in assessment of this patient.  Labs: CBC: Recent Labs  Lab 06/20/24 2137 06/22/24 0506 06/23/24 0430 06/24/24 0528  WBC 11.0* 8.4 11.8* 9.9  HGB 12.0 11.4* 11.1* 11.6*  HCT 39.7 36.9 36.4 37.3  MCV 105.9* 103.9* 104.0* 103.9*  PLT 188 163 169 164   Basic Metabolic Panel: Recent Labs  Lab 06/20/24 2137 06/22/24 0506 06/22/24 2359 06/23/24 0430 06/24/24 0528  NA 136 131* 132* 130* 130*  K 4.7 7.1* 4.8 4.5 5.0  CL 94* 91* 86* 89* 86*  CO2 28 23 28 28 27   GLUCOSE 136* 188* 207* 209* 190*  BUN 46* 68* 45* 50* 79*  CREATININE 5.98* 8.35* 5.54* 5.54* 7.39*  CALCIUM  8.1* 7.8* 8.1* 7.9* 7.6*  MG  --   --   --   --  1.8  PHOS  --  3.4  --   --  5.3*   Liver Function Tests: Recent Labs  Lab 06/20/24 2137  AST 27  ALT 17  ALKPHOS 143*  BILITOT 0.8  PROT 7.7  ALBUMIN  3.5   CBG: Recent Labs  Lab 06/22/24 2146 06/23/24 0802 06/23/24 1132 06/23/24 1632 06/23/24 2057  GLUCAP 170* 184* 210* 170* 205*    Scheduled Meds:  allopurinol   100 mg Oral  Daily   apixaban   2.5 mg Oral BID   benzonatate   100 mg Oral TID   bisacodyl   5 mg Oral Daily   Chlorhexidine  Gluconate Cloth  6 each Topical Q0600   cholecalciferol   1,000 Units Oral Daily   cinacalcet   30 mg Oral Q supper   droxidopa   200 mg Oral TID with meals   ezetimibe   10 mg Oral QPM   ferric citrate   420 mg Oral TID WC   fludrocortisone   0.2 mg Oral BID   insulin  aspart  0-5 Units Subcutaneous QHS   insulin  aspart  0-6 Units Subcutaneous TID WC   insulin  aspart  2 Units Subcutaneous TID WC   insulin  glargine-yfgn  5 Units Subcutaneous Daily   ipratropium-albuterol   3 mL Nebulization BID   methylPREDNISolone  (SOLU-MEDROL ) injection  40 mg Intravenous Daily   midodrine   10 mg Oral TID WC   pantoprazole   40 mg Oral Daily   rosuvastatin   10 mg Oral QPM   Continuous Infusions:  cefTRIAXone  (ROCEPHIN )  IV 1 g (06/24/24 0638)   PRN Meds:.acetaminophen  **OR** acetaminophen , bisacodyl , chlorpheniramine-HYDROcodone , magnesium  hydroxide, methocarbamol , midodrine , ondansetron  **OR** ondansetron  (ZOFRAN ) IV, oxyCODONE -acetaminophen , sodium phosphate, traZODone   Family Communication: None at bedside  Disposition: Status is: Inpatient Remains inpatient appropriate because: Bronchitis, ESRD     Time spent: 38 minutes  Length of inpatient stay: 3 days  Author: Carliss LELON Canales, DO 06/24/2024 12:55 PM  For on call review [email or web address].

## 2024-06-24 NOTE — Plan of Care (Signed)
  Problem: Activity: Goal: Risk for activity intolerance will decrease Outcome: Progressing   Problem: Elimination: Goal: Will not experience complications related to bowel motility Outcome: Progressing   

## 2024-06-24 NOTE — Progress Notes (Signed)
  Received patient in bed to unit.   Informed consent signed and in chart.    TX duration: 3.5hrs     Transported back to floor  Hand-off given to patient's nurse. No acute distress noted    Access used: L AVF Access issues: none   Total UF removed: 2.0L Medication(s) given: nobne Post HD VS: wnl Post HD weight: 105.3kg     Olivia Hurst LPN [facility]

## 2024-06-24 NOTE — Progress Notes (Addendum)
 915 am PT attempt. Pt currently off floor for HD. Acute PT will continue to follow per current POC and return at a later time/date.    Author returned at 1355 to attempt PT session. Pt is alert and endorsing feel well however requested to hold PT today. I'm really tired and Im waiting for my lunch to get here. Can you come back in the morning? Acute PT will continue to follow per current POC. DC recs remain appropriate.

## 2024-06-24 NOTE — Care Management Important Message (Signed)
 Important Message  Patient Details  Name: Jacqueline [name] MRN: [id number] Date of Birth: August 26, 1946   Important Message Given:  Yes - Medicare IM     Akiva Josey W, CMA 06/24/2024, 1:16 PM

## 2024-06-25 DIAGNOSIS — N186 End stage renal disease: Secondary | ICD-10-CM | POA: Diagnosis not present

## 2024-06-25 DIAGNOSIS — E875 Hyperkalemia: Secondary | ICD-10-CM | POA: Diagnosis not present

## 2024-06-25 DIAGNOSIS — E1122 Type 2 diabetes mellitus with diabetic chronic kidney disease: Secondary | ICD-10-CM | POA: Diagnosis not present

## 2024-06-25 DIAGNOSIS — J209 Acute bronchitis, unspecified: Secondary | ICD-10-CM | POA: Diagnosis not present

## 2024-06-25 LAB — CBC
HCT: 38.6 % (ref 36.0–46.0)
Hemoglobin: 11.8 g/dL — ABNORMAL LOW (ref 12.0–15.0)
MCH: 31.8 pg (ref 26.0–34.0)
MCHC: 30.6 g/dL (ref 30.0–36.0)
MCV: 104 fL — ABNORMAL HIGH (ref 80.0–100.0)
Platelets: 170 K/uL (ref 150–400)
RBC: 3.71 MIL/uL — ABNORMAL LOW (ref 3.87–5.11)
RDW: 15.4 % (ref 11.5–15.5)
WBC: 9.4 K/uL (ref 4.0–10.5)
nRBC: 0 % (ref 0.0–0.2)

## 2024-06-25 LAB — BASIC METABOLIC PANEL WITH GFR
Anion gap: 13 (ref 5–15)
BUN: 55 mg/dL — ABNORMAL HIGH (ref 8–23)
CO2: 30 mmol/L (ref 22–32)
Calcium: 7.7 mg/dL — ABNORMAL LOW (ref 8.9–10.3)
Chloride: 90 mmol/L — ABNORMAL LOW (ref 98–111)
Creatinine, Ser: 5.39 mg/dL — ABNORMAL HIGH (ref 0.44–1.00)
GFR, Estimated: 8 mL/min — ABNORMAL LOW (ref >60)
Glucose, Bld: 220 mg/dL — ABNORMAL HIGH (ref 70–99)
Potassium: 4.1 mmol/L (ref 3.5–5.1)
Sodium: 133 mmol/L — ABNORMAL LOW (ref 135–145)

## 2024-06-25 LAB — GLUCOSE, CAPILLARY
Glucose-Capillary: 229 mg/dL — ABNORMAL HIGH (ref 70–99)
Glucose-Capillary: 233 mg/dL — ABNORMAL HIGH (ref 70–99)

## 2024-06-25 MED ORDER — PREDNISONE 10 MG PO TABS: ORAL_TABLET | ORAL | 0 refills | Status: AC

## 2024-06-25 MED ORDER — IPRATROPIUM-ALBUTEROL 0.5-2.5 (3) MG/3ML IN SOLN: 3.0000 mL | Freq: Two times a day (BID) | RESPIRATORY_TRACT | Status: AC

## 2024-06-25 MED ORDER — BENZONATATE 100 MG PO CAPS: 100.0000 mg | ORAL_CAPSULE | Freq: Three times a day (TID) | ORAL | 0 refills | Status: DC

## 2024-06-25 MED ORDER — APIXABAN 2.5 MG PO TABS: 2.5000 mg | ORAL_TABLET | Freq: Two times a day (BID) | ORAL | Status: AC

## 2024-06-25 NOTE — Progress Notes (Signed)
 Discharge instructions in discharge packet to be given to [facility] for transport back to [facility] facility. IV removed without complications. Patient will be traveling by [facility] non emergent transport with TLSO brace to be used at facility while ambulating. Handoff report called to [facility], Charity fundraiser at [employer].

## 2024-06-25 NOTE — TOC Progression Note (Signed)
 Transition of Care [facility]) - Progression Note    Patient Details  Name: Jacqueline [name] MRN: [id number] Date of Birth: 09/28/1946  Transition of Care [facility]) CM/SW Contact  Elouise LULLA Capri, RN 06/25/2024, 1:35 PM  Clinical Narrative:     CM completed returning FL2 for [facility] [place]. Short term rehab recommended per therapy recommendations. CM alert to Dr. Arlon regarding signing FL2. CM call to Tammy, Admissions, [facility] [place] regarding patient return and short term rehab. Per Tammy, patient is okay to return. CM alert to [employer], CMA and Rojelio RAMAN, CMA regarding initiating auth.   Expected Discharge Plan: Skilled Nursing Facility Barriers to Discharge: No Barriers Identified  Expected Discharge Plan and Services    SNF  Expected Discharge Date: 06/25/24                 Social Drivers of Health (SDOH) Interventions SDOH Screenings   Food Insecurity: No Food Insecurity (06/21/2024)  Housing: Low Risk  (06/21/2024)  Transportation Needs: No Transportation Needs (06/21/2024)  Utilities: Not At Risk (06/21/2024)  Depression (PHQ2-9): Low Risk  (06/27/2021)  Financial Resource Strain: Medium Risk (06/27/2021)  Physical Activity: Inactive (06/27/2021)  Social Connections: Moderately Integrated (06/21/2024)  Stress: No Stress Concern Present (06/27/2021)  Tobacco Use: Medium Risk (01/27/2024)    Readmission Risk Interventions    05/22/2022   12:26 PM  Readmission Risk Prevention Plan  Transportation Screening Complete  PCP or Specialist Appt within 3-5 Days Complete  HRI or Home Care Consult Complete  Social Work Consult for Recovery Care Planning/Counseling Complete  Palliative Care Screening Complete  Medication Review Oceanographer) Complete

## 2024-06-25 NOTE — Discharge Summary (Signed)
 Physician Discharge Summary   Patient: Jacqueline [name] MRN: [id number] DOB: 1946/08/12  Admit date:     06/20/2024  Discharge date: 06/25/24  Discharge Physician: Carliss LELON Canales   PCP: Austin Candyce Stall, MD   Recommendations at discharge:    Pt to be discharged to SNF.   If you experience worsening fever, chills, chest pain, shortness of breath, or other concerning symptoms, please call your PCP or go to the emergency department immediately.  Discharge Diagnoses: Active Problems:   Hyperkalemia   Acute bronchitis   ESRD (end stage renal disease) (HCC)   Type 2 diabetes mellitus with end-stage renal disease (HCC)   Atrial fibrillation, chronic (HCC)   Obesity (BMI 30-39.9)   Hyperlipidemia, unspecified  Resolved Problems:   * No resolved hospital problems. *   Hospital Course:  78 y.o. female with medical history significant of atrial fibrillation on Eliquis , ESRD- HD TTS, CAD, type 2 diabetes mellitus on insulin , dyslipidemia, heart failure with preserved EF, Questionable history of COPD and chronic O2 use - presenting with acute bronchitis and hyperkalemia.   Assessment and Plan:   Acute bronchitis - Initiated on Solu-Medrol , scheduled nebulizer therapy, antitussives.  Showed improvement throughout the course of hospital stay.  Upon discharge we will transition to p.o. prednisone taper to take as directed.  Will also recommend patient receives scheduled DuoNebs twice daily.  Provide pulmonary/ENT consultation as outpatient on discharge for noted tracheobronchial stenosis.  TLSO brace provided to help with rib pain from coughing.   Hyperkalemia - potassium 7.1 on 7/22 prompting urgent dialysis.  Recheck noting correction down to 4.3.  Appears resolved at this time.     ESRD - Nephrology following closely.  HD on TTS schedule.  Resume normal outpatient regiment of dialysis.   Diabetes mellitus - Likely exacerbated by Solu-Medrol .  Continue previous insulin  regimen.   Chronic  atrial fibrillation - Continue Eliquis  2.5 mg twice daily (renally dosed)   Physical debilitation and muscle weakness - Evaluated by PT and recommending STR.  Recommend patient get continued physical therapy while at SNF.  Consultants: Nephrology Procedures performed: Hemodialysis Disposition: Skilled nursing facility Diet recommendation:  Discharge Diet Orders (From admission, onward)     Start     Ordered   06/25/24 0000  Diet - low sodium heart healthy        06/25/24 0952           Cardiac and Carb modified diet  DISCHARGE MEDICATION: Allergies as of 06/25/2024       Reactions   Sulfa Antibiotics Other (See Comments)   Sulfa Drugs Cross Reactors Other (See Comments)   Doesn't remember         Medication List     TAKE these medications    acetaminophen  650 MG CR tablet Commonly known as: TYLENOL  Take 650 mg by mouth every 8 (eight) hours. (0600, 1400 & 2200)   albuterol  (2.5 MG/3ML) 0.083% nebulizer solution Commonly known as: PROVENTIL  Take 2.5 mg by nebulization every 6 (six) hours as needed for wheezing or shortness of breath.   allopurinol  100 MG tablet Commonly known as: ZYLOPRIM  Take 100 mg by mouth daily.   aluminum -magnesium  hydroxide-simethicone  200-200-20 MG/5ML Susp Commonly known as: MAALOX Take 30 mLs by mouth 4 (four) times daily -  before meals and at bedtime. What changed:  how much to take when to take this reasons to take this   apixaban  2.5 MG Tabs tablet Commonly known as: ELIQUIS  Take 1 tablet (2.5 mg total)  by mouth 2 (two) times daily. What changed:  medication strength how much to take   artificial tears ophthalmic solution Place 1 drop into both eyes as needed for dry eyes. What changed: when to take this   Aspercreme Lidocaine  4 % Crea Generic drug: Lidocaine  HCl Apply 1 Application topically in the morning and at bedtime. Applied to bilateral knees   benzonatate  100 MG capsule Commonly known as: TESSALON  Take 1  capsule (100 mg total) by mouth 3 (three) times daily.   bisacodyl  10 MG suppository Commonly known as: DULCOLAX Place 10 mg rectally daily as needed for moderate constipation.   cinacalcet  30 MG tablet Commonly known as: SENSIPAR  Take 30 mg by mouth daily with supper. (1800)   clotrimazole-betamethasone cream Commonly known as: LOTRISONE Apply 1 Application topically See admin instructions. Apply as needed to affected area of ear for itching/irritation.   Droxidopa  200 MG Caps Take 200 mg by mouth with breakfast, with lunch, and with evening meal.   ezetimibe  10 MG tablet Commonly known as: ZETIA  Take 1 tablet (10 mg total) by mouth daily. What changed:  when to take this additional instructions   ferric citrate  1 GM 210 MG(Fe) tablet Commonly known as: AURYXIA  Take 420 mg by mouth 3 (three) times daily with meals.   fexofenadine 180 MG tablet Commonly known as: ALLEGRA Take 180 mg by mouth in the morning. (0800)   fludrocortisone  0.1 MG tablet Commonly known as: FLORINEF  Take 2 tablets (0.2 mg total) by mouth 2 (two) times daily.   fluticasone 50 MCG/ACT nasal spray Commonly known as: FLONASE Place 1 spray into both nostrils in the morning. (0900)   dextromethorphan -guaiFENesin  30-600 MG 12hr tablet Commonly known as: MUCINEX  DM Take 1 tablet by mouth 2 (two) times daily. (0900 & 1800)   guaiFENesin -dextromethorphan  100-10 MG/5ML syrup Commonly known as: ROBITUSSIN DM Take 15 mLs by mouth every 4 (four) hours as needed for cough.   HYDROcodone -acetaminophen  5-325 MG tablet Commonly known as: NORCO/VICODIN Take 1-2 tablets by mouth every 6 (six) hours as needed for moderate pain.   insulin  aspart 100 UNIT/ML injection Commonly known as: novoLOG  Inject 0-20 Units into the skin 3 (three) times daily with meals. For CBG 70-120: 0 units For CBG 121-150: 3 units For CBG 151-200: 4 units For CBG 201-250: 7 units For CBG 251-300: 11 units For CBG 301-350: 15  units For CBG 351-400: 20 units For CBG > 400: 25 units and call your doctor   insulin  glargine-yfgn 100 UNIT/ML injection Commonly known as: SEMGLEE  Inject 0.05 mLs (5 Units total) into the skin daily.   ipratropium-albuterol  0.5-2.5 (3) MG/3ML Soln Commonly known as: DUONEB Take 3 mLs by nebulization 2 (two) times daily.   magnesium  hydroxide 400 MG/5ML suspension Commonly known as: MILK OF MAGNESIA Take 30 mLs by mouth daily as needed (if no BM in 3 days.).   midodrine  10 MG tablet Commonly known as: PROAMATINE  Take 20 mg by mouth as needed (for systolic BP less than 100.).   midodrine  10 MG tablet Commonly known as: PROAMATINE  Take 1 tablet (10 mg total) by mouth 3 (three) times daily with meals.   MOISTURE BARRIER EX Apply 1 Application topically as directed. Apply barrier cream to buttocks every shift   multivitamin with minerals Tabs tablet Take 1 tablet by mouth at bedtime.   naloxone 4 MG/0.1ML Liqd nasal spray kit Commonly known as: NARCAN Place 1 spray into the nose 3 (three) times daily as needed (signs and symptoms of  opioid overdose).   nitroGLYCERIN  0.4 MG SL tablet Commonly known as: NITROSTAT  Place 1 tablet (0.4 mg total) under the tongue every 5 (five) minutes as needed for chest pain.   ondansetron  4 MG disintegrating tablet Commonly known as: ZOFRAN -ODT Take 4 mg by mouth every 8 (eight) hours as needed for nausea or vomiting.   OXYGEN  Inhale 2 L/min into the lungs as needed (shortness of breath or decreased O2).   pantoprazole  40 MG tablet Commonly known as: PROTONIX  Take 1 tablet (40 mg total) by mouth daily.   polyethylene glycol 17 g packet Commonly known as: MIRALAX  / GLYCOLAX  Take 17 g by mouth daily as needed for moderate constipation.   predniSONE 10 MG tablet Commonly known as: DELTASONE Take 4 tablets (40 mg total) by mouth daily for 3 days, THEN 3 tablets (30 mg total) daily for 3 days, THEN 2 tablets (20 mg total) daily for 3  days, THEN 1 tablet (10 mg total) daily for 3 days. Start taking on: June 25, 2024   rosuvastatin  10 MG tablet Commonly known as: CRESTOR  Take 1 tablet (10 mg total) by mouth daily. What changed: when to take this   sodium phosphate Enem Place 1 enema rectally daily as needed for severe constipation (if no BM results in 12 hours from dulcolax suppository).   sorbitol 70 % solution Take 30 mLs by mouth daily as needed (constipation.).        Follow-up Information     Herminio Miu, MD Follow up.   Specialty: Otolaryngology Why: trachealbroncheal stenosis Contact information: [address] [place] [address] [place] [address] [phone]                 Discharge Exam: Fredricka Weights   06/22/24 0825 06/24/24 0801 06/24/24 1153  Weight: 102.6 kg 107.2 kg 105.3 kg    GENERAL:  Alert, pleasant, no acute distress  HEENT:  EOMI CARDIOVASCULAR:  RRR, no murmurs appreciated RESPIRATORY: Poor air movement bilaterally GASTROINTESTINAL:  Soft, nontender, nondistended EXTREMITIES:  No LE edema bilaterally NEURO:  No new focal deficits appreciated SKIN:  No rashes noted PSYCH:  Appropriate mood and affect    Condition at discharge: improving  The results of significant diagnostics from this hospitalization (including imaging, microbiology, ancillary and laboratory) are listed below for reference.   Imaging Studies: CT Angio Chest PE W and/or Wo Contrast Addendum Date: 06/21/2024 ** ADDENDUM #1 ** ADDENDUM: Also noted are nondisplaced right posterolateral 7th and 8th rib fractures. No pneumothorax. ---------------------------------------------------- Electronically signed by: Pinkie Pebbles MD 06/21/2024 01:10 AM EDT RP Workstation: [id number]   Result Date: 06/21/2024 ** ORIGINAL REPORT ** EXAM: CTA CHEST PE WITH CONTRAST CT ABDOMEN AND PELVIS WITH CONTRAST 06/21/2024 12:49:21 AM TECHNIQUE: CTA of the chest was performed after the administration of  intravenous contrast. Multiplanar reformatted images are provided for review. MIP images are provided for review. CT of the abdomen and pelvis was performed with the administration of intravenous contrast. Automated exposure control, iterative reconstruction, and/or weight based adjustment of the mA/kV was utilized to reduce the radiation dose to as low as reasonably achievable. COMPARISON: CT chest dated 08/09/2023 and CT abdomen/pelvis dated 06/07/2023. CLINICAL HISTORY: Pulmonary embolism (PE) suspected, high prob. Pt reports that she has right sided abdominal pain x 1 year and left shoulder pain for a long time and vaginal discharge x 1 year, as well as intermittent headaches x 2 years. When asked why she had staff call EMS tonight, she says because she wants to find  out what's going on and because she had a bad episode of abdominal pain last night. Pt is alert and oriented x 4. FINDINGS: CHEST: PULMONARY ARTERIES: Pulmonary arteries are adequately opacified for evaluation. No evidence of pulmonary embolism. MEDIASTINUM: Cardiomegaly with left cardiomediastinal shift. Moderate 3-vessel coronary atherosclerosis. No mediastinal lymphadenopathy. LUNGS AND PLEURA: Mild mosaic attenuation / ground-glass opacity in the lungs bilaterally. Mild atelectasis in the lingula and bilateral lower lobes. No pleural effusion or pneumothorax. AIRWAYS: Narrowing of the trachea and bilateral mainstem bronchi (image 52), raising the possibility of tracheobronchial stenosis. SOFT TISSUES AND BONES: No acute bone or soft tissue abnormality. Mild degenerative changes of the visualized thoracolumbar spine. ABDOMEN AND PELVIS: LIVER: The liver is unremarkable. GALLBLADDER AND BILE DUCTS: Gallbladder is unremarkable. No biliary ductal dilatation. SPLEEN: Spleen demonstrates no acute abnormality. PANCREAS: Pancreas demonstrates no acute abnormality. ADRENAL GLANDS: Adrenal glands demonstrate no acute abnormality. KIDNEYS, URETERS AND  BLADDER: Bilateral renal atrophy. Simple bilateral renal cysts, including a dominant 9.5 cm exophytic left upper pole cyst (image 37), benign (Bosniak 1). No stones in the kidneys or ureters. No hydronephrosis. No perinephric or periureteral stranding. Urinary bladder is decompressed and poorly visualized. GI AND BOWEL: Normal appendix (image 68). Stomach and duodenal sweep demonstrate no acute abnormality. There is no bowel obstruction. No abnormal bowel wall thickening or distension. REPRODUCTIVE: Status post hysterectomy. PERITONEUM AND RETROPERITONEUM: No ascites or free air. LYMPH NODES: No lymphadenopathy. BONES AND SOFT TISSUES: No acute abnormality of the visualized bones. No focal soft tissue abnormality. VASCULATURE: Atherosclerotic calcifications of the abdominal aorta and branch vessels, although patent. Although not tailored for evaluation of the thoracic aorta, there is no evidence of thoracic aortic aneurysm or dissection. Thoracic atherosclerosis. IMPRESSION: 1. No pulmonary embolism. 2. Narrowing of the trachea and bilateral mainstem bronchi, raising the possibility of tracheobronchial stenosis. 3. No acute findings in the abdomen/pelvis. Electronically signed by: Pinkie Pebbles MD 06/21/2024 01:04 AM EDT RP Workstation: [id number]   CT ABDOMEN PELVIS W CONTRAST Addendum Date: 06/21/2024 ** ADDENDUM #1 ** ADDENDUM: Also noted are nondisplaced right posterolateral 7th and 8th rib fractures. No pneumothorax. ---------------------------------------------------- Electronically signed by: Pinkie Pebbles MD 06/21/2024 01:10 AM EDT RP Workstation: [id number]   Result Date: 06/21/2024 ** ORIGINAL REPORT ** EXAM: CTA CHEST PE WITH CONTRAST CT ABDOMEN AND PELVIS WITH CONTRAST 06/21/2024 12:49:21 AM TECHNIQUE: CTA of the chest was performed after the administration of intravenous contrast. Multiplanar reformatted images are provided for review. MIP images are provided for review. CT of the abdomen and  pelvis was performed with the administration of intravenous contrast. Automated exposure control, iterative reconstruction, and/or weight based adjustment of the mA/kV was utilized to reduce the radiation dose to as low as reasonably achievable. COMPARISON: CT chest dated 08/09/2023 and CT abdomen/pelvis dated 06/07/2023. CLINICAL HISTORY: Pulmonary embolism (PE) suspected, high prob. Pt reports that she has right sided abdominal pain x 1 year and left shoulder pain for a long time and vaginal discharge x 1 year, as well as intermittent headaches x 2 years. When asked why she had staff call EMS tonight, she says because she wants to find out what's going on and because she had a bad episode of abdominal pain last night. Pt is alert and oriented x 4. FINDINGS: CHEST: PULMONARY ARTERIES: Pulmonary arteries are adequately opacified for evaluation. No evidence of pulmonary embolism. MEDIASTINUM: Cardiomegaly with left cardiomediastinal shift. Moderate 3-vessel coronary atherosclerosis. No mediastinal lymphadenopathy. LUNGS AND PLEURA: Mild mosaic attenuation / ground-glass opacity in the lungs bilaterally. Mild  atelectasis in the lingula and bilateral lower lobes. No pleural effusion or pneumothorax. AIRWAYS: Narrowing of the trachea and bilateral mainstem bronchi (image 52), raising the possibility of tracheobronchial stenosis. SOFT TISSUES AND BONES: No acute bone or soft tissue abnormality. Mild degenerative changes of the visualized thoracolumbar spine. ABDOMEN AND PELVIS: LIVER: The liver is unremarkable. GALLBLADDER AND BILE DUCTS: Gallbladder is unremarkable. No biliary ductal dilatation. SPLEEN: Spleen demonstrates no acute abnormality. PANCREAS: Pancreas demonstrates no acute abnormality. ADRENAL GLANDS: Adrenal glands demonstrate no acute abnormality. KIDNEYS, URETERS AND BLADDER: Bilateral renal atrophy. Simple bilateral renal cysts, including a dominant 9.5 cm exophytic left upper pole cyst (image 37),  benign (Bosniak 1). No stones in the kidneys or ureters. No hydronephrosis. No perinephric or periureteral stranding. Urinary bladder is decompressed and poorly visualized. GI AND BOWEL: Normal appendix (image 68). Stomach and duodenal sweep demonstrate no acute abnormality. There is no bowel obstruction. No abnormal bowel wall thickening or distension. REPRODUCTIVE: Status post hysterectomy. PERITONEUM AND RETROPERITONEUM: No ascites or free air. LYMPH NODES: No lymphadenopathy. BONES AND SOFT TISSUES: No acute abnormality of the visualized bones. No focal soft tissue abnormality. VASCULATURE: Atherosclerotic calcifications of the abdominal aorta and branch vessels, although patent. Although not tailored for evaluation of the thoracic aorta, there is no evidence of thoracic aortic aneurysm or dissection. Thoracic atherosclerosis. IMPRESSION: 1. No pulmonary embolism. 2. Narrowing of the trachea and bilateral mainstem bronchi, raising the possibility of tracheobronchial stenosis. 3. No acute findings in the abdomen/pelvis. Electronically signed by: Pinkie Pebbles MD 06/21/2024 01:04 AM EDT RP Workstation: [id number]   CT HEAD WO CONTRAST ( ) Result Date: 06/21/2024 EXAM: CT HEAD WITHOUT CONTRAST 06/21/2024 12:49:21 AM TECHNIQUE: CT of the head was performed without the administration of intravenous contrast. Automated exposure control, iterative reconstruction, and/or weight based adjustment of the mA/kV was utilized to reduce the radiation dose to as low as reasonably achievable. COMPARISON: 09/30/2023 CLINICAL HISTORY: Headache, new onset (Age >= 51y). Pt reports that she has right sided abdominal pain x 1 year and left shoulder pain for a long time and vaginal discharge x 1 year, as well as intermittent headaches x 2 years. When asked why she had staff call EMS tonight, she says because she wants to find out what's going on and because she had a bad episode of abdominal pain last night. Pt is alert and  oriented x 4. FINDINGS: BRAIN AND VENTRICLES: No acute hemorrhage. Gray-white differentiation is preserved. No hydrocephalus. No extra-axial collection. No mass effect or midline shift. Mild chronic ischemic white matter changes. ORBITS: No acute abnormality. SINUSES: Chronic right maxillary sinusitis. Bilateral mastoid effusions. SOFT TISSUES AND SKULL: No acute soft tissue abnormality. No skull fracture. Calcific atherosclerosis of the vertebral and carotid arteries at the skull base. IMPRESSION: 1. No acute intracranial abnormality. 2. Mild chronic ischemic white matter changes. 3. Chronic right maxillary sinusitis and bilateral mastoid effusions. Electronically signed by: Franky Stanford MD 06/21/2024 12:53 AM EDT RP Workstation: [id number]   US  ABDOMEN LIMITED RUQ (LIVER/GB) Result Date: 06/20/2024 CLINICAL DATA:  [id number] Upper abdominal pain [id number] EXAM: ULTRASOUND ABDOMEN LIMITED RIGHT UPPER QUADRANT COMPARISON:  CT abdomen pelvis 06/07/2023 FINDINGS: Gallbladder: No definite gallstones. Gallbladder wall thickening. No pericholecystic fluid. No sonographic Murphy sign noted by sonographer. Common bile duct: Diameter: 4 mm. Liver: No focal lesion identified. Increased parenchymal echogenicity. Portal vein is patent on color Doppler imaging with normal direction of blood flow towards the liver. Other: None. IMPRESSION: 1. Nonspecific gallbladder wall thickening. Finding can be seen in  the setting of chronic liver disease. Correlate with liver function tests. 2. Hepatic steatosis. Please note limited evaluation for focal hepatic masses in a patient with hepatic steatosis due to decreased penetration of the acoustic ultrasound waves. Electronically Signed   By: Morgane  Naveau M.D.   On: 06/20/2024 23:58    Microbiology: Results for orders placed or performed during the hospital encounter of 06/07/23  Blood culture (routine x 2)     Status: None   Collection Time: 06/07/23 12:54 PM   Specimen: BLOOD   Result Value Ref Range Status   Specimen Description BLOOD RIGHT ANTECUBITAL  Final   Special Requests   Final    BOTTLES DRAWN AEROBIC AND ANAEROBIC Blood Culture adequate volume   Culture   Final    NO GROWTH 5 DAYS Performed at [facility], [address] [place], [place] [address]    Report Status 06/12/2023 FINAL  Final  Blood culture (routine x 2)     Status: None   Collection Time: 06/07/23 12:54 PM   Specimen: BLOOD  Result Value Ref Range Status   Specimen Description BLOOD BLOOD RIGHT FOREARM  Final   Special Requests   Final    BOTTLES DRAWN AEROBIC AND ANAEROBIC Blood Culture adequate volume   Culture   Final    NO GROWTH 5 DAYS Performed at [facility], [address], [place], [place] [address]    Report Status 06/12/2023 FINAL  Final  MRSA Next Gen by PCR, Nasal     Status: None   Collection Time: 06/07/23 10:18 PM   Specimen: Nasal Mucosa; Nasal Swab  Result Value Ref Range Status   MRSA by PCR Next Gen NOT DETECTED NOT DETECTED Final    Comment: (NOTE) The GeneXpert MRSA Assay (FDA approved for NASAL specimens only), is one component of a comprehensive MRSA colonization surveillance program. It is not intended to diagnose MRSA infection nor to guide or monitor treatment for MRSA infections. Test performance is not FDA approved in patients less than 76 years old. Performed at [facility], [address] [place], [place] [address]     Labs: CBC: Recent Labs  Lab 06/20/24 2137 06/22/24 0506 06/23/24 0430 06/24/24 0528 06/25/24 0353  WBC 11.0* 8.4 11.8* 9.9 9.4  HGB 12.0 11.4* 11.1* 11.6* 11.8*  HCT 39.7 36.9 36.4 37.3 38.6  MCV 105.9* 103.9* 104.0* 103.9* 104.0*  PLT 188 163 169 164 170   Basic Metabolic Panel: Recent Labs  Lab 06/22/24 0506 06/22/24 2359 06/23/24 0430 06/24/24 0528 06/25/24 0353  NA 131* 132* 130* 130* 133*  K 7.1* 4.8 4.5 5.0 4.1  CL 91* 86* 89* 86* 90*  CO2 23 28 28 27 30   GLUCOSE  188* 207* 209* 190* 220*  BUN 68* 45* 50* 79* 55*  CREATININE 8.35* 5.54* 5.54* 7.39* 5.39*  CALCIUM  7.8* 8.1* 7.9* 7.6* 7.7*  MG  --   --   --  1.8  --   PHOS 3.4  --   --  5.3*  --    Liver Function Tests: Recent Labs  Lab 06/20/24 2137  AST 27  ALT 17  ALKPHOS 143*  BILITOT 0.8  PROT 7.7  ALBUMIN  3.5   CBG: Recent Labs  Lab 06/23/24 2057 06/24/24 1248 06/24/24 1659 06/24/24 2103 06/25/24 0737  GLUCAP 205* 146* 219* 269* 229*    Discharge time spent: 26 minutes.  Length of inpatient stay: 4 days  Signed: Carliss LELON Canales, DO [facility] 06/25/2024

## 2024-06-25 NOTE — TOC Transition Note (Addendum)
 Transition of Care [facility]) - Discharge Note   Patient Details  Name: Jacqueline [name] MRN: [id number] Date of Birth: 1946-10-16  [facility] of [facility]) CM/SW Contact:  Elouise LULLA Capri, RN 06/25/2024, 2:27 PM   Clinical Narrative:     CM alert to Christiane SQUIBB, CMA and Rojelio RAMAN, CMA regarding SNF auth for short term rehab at [facility]. CM call to patient's son, [name], phone: [phone] regarding patient pending discharge and return to SNF. Patient son, verbalized understanding and agreement. Discharge orders noted, discharge summary noted, faxed to [facility]. CM call to [employer], phone: [phone] regarding BLS transport. CM spoke to [place]. BLS transport scheduled for next available.    CM faxed discharge summary to [facility], [address], [place], [place] fax [phone].  Final next level of care: Skilled Nursing Facility Barriers to Discharge: No Barriers Identified   Patient Goals and CMS Choice    SNF   Discharge Placement    SNF           Discharge Plan and Services Additional resources added to the After Visit Summary for     Social Drivers of Health (SDOH) Interventions SDOH Screenings   Food Insecurity: No Food Insecurity (06/21/2024)  Housing: Low Risk  (06/21/2024)  Transportation Needs: No Transportation Needs (06/21/2024)  Utilities: Not At Risk (06/21/2024)  Depression (PHQ2-9): Low Risk  (06/27/2021)  Financial Resource Strain: Medium Risk (06/27/2021)  Physical Activity: Inactive (06/27/2021)  Social Connections: Moderately Integrated (06/21/2024)  Stress: No Stress Concern Present (06/27/2021)  Tobacco Use: Medium Risk (01/27/2024)     Readmission Risk Interventions    05/22/2022   12:26 PM  Readmission Risk Prevention Plan  Transportation Screening Complete  PCP or Specialist Appt within 3-5 Days Complete  HRI or Home Care Consult Complete  Social Work Consult for Recovery Care Planning/Counseling  Complete  Palliative Care Screening Complete  Medication Review Oceanographer) Complete

## 2024-06-25 NOTE — Progress Notes (Signed)
 [facility]  ROUNDING NOTE   Subjective:   Jacqueline [name]  is a 78 y.o.  female  with a PMHx of ESRD on HD, diabetes mellitus type 2, chronic hypotension, peripheral arterial disease, obstructive sleep apnea, GERD, obesity, hyperlipidemia, anemia chronic kidney disease, secondary hyperparathyroidism.  Patient presented to the emergency department with multiple complaints from the rehab facility.  Complaints including shoulder pain, abdominal pain, headaches, and vaginal discharge.  Patient has been admitted for COPD exacerbation (HCC) [J44.1] Upper abdominal pain [R10.10] Generalized headache [R51.9] COPD with acute exacerbation (HCC) [J44.1] Closed fracture of multiple ribs of right side, initial encounter [S22.41XA]  Patient is known to our practice and receives outpatient dialysis treatments at [facility], on a TTS schedule, supervised by [facility] physicians.    Update: Patient underwent hemodialysis treatment yesterday. Tolerating well. Resting comfortably in bed today.  Objective:  Vital signs in last 24 hours:  Temp:  [97.9 F (36.6 C)-98.7 F (37.1 C)] 98.7 F (37.1 C) (07/25 0828) Pulse Rate:  [58-64] 64 (07/25 0828) Resp:  [16-20] 19 (07/25 0828) BP: (127-139)/(61-63) 132/63 (07/25 0828) SpO2:  [98 %-100 %] 99 % (07/25 0828)  Weight change:  Filed Weights   06/22/24 0825 06/24/24 0801 06/24/24 1153  Weight: 102.6 kg 107.2 kg 105.3 kg    Intake/Output: I/O last 3 completed shifts: In: 240 [P.O.:240] Out: 2000 [Other:2000]   Intake/Output this shift:  Total I/O In: 120 [P.O.:120] Out: -   Physical Exam: General: NAD  Head: Normocephalic, atraumatic. Moist oral mucosal membranes  Eyes: Anicteric  Neck: Supple  Lungs:  Clear to auscultation, normal effort  Heart: Regular rate and rhythm  Abdomen:  Soft, nontender  Extremities: 1+ peripheral edema.  Neurologic: Awake, alert, conversant  Skin: Warm,dry, no rash  Access: Left aVF     Basic Metabolic Panel: Recent Labs  Lab 06/22/24 0506 06/22/24 2359 06/23/24 0430 06/24/24 0528 06/25/24 0353  NA 131* 132* 130* 130* 133*  K 7.1* 4.8 4.5 5.0 4.1  CL 91* 86* 89* 86* 90*  CO2 23 28 28 27 30   GLUCOSE 188* 207* 209* 190* 220*  BUN 68* 45* 50* 79* 55*  CREATININE 8.35* 5.54* 5.54* 7.39* 5.39*  CALCIUM  7.8* 8.1* 7.9* 7.6* 7.7*  MG  --   --   --  1.8  --   PHOS 3.4  --   --  5.3*  --     Liver Function Tests: Recent Labs  Lab 06/20/24 2137  AST 27  ALT 17  ALKPHOS 143*  BILITOT 0.8  PROT 7.7  ALBUMIN  3.5   Recent Labs  Lab 06/20/24 2137  LIPASE 50   No results for input(s): AMMONIA in the last 168 hours.  CBC: Recent Labs  Lab 06/20/24 2137 06/22/24 0506 06/23/24 0430 06/24/24 0528 06/25/24 0353  WBC 11.0* 8.4 11.8* 9.9 9.4  HGB 12.0 11.4* 11.1* 11.6* 11.8*  HCT 39.7 36.9 36.4 37.3 38.6  MCV 105.9* 103.9* 104.0* 103.9* 104.0*  PLT 188 163 169 164 170    Cardiac Enzymes: No results for input(s): CKTOTAL, CKMB, CKMBINDEX, TROPONINI in the last 168 hours.  BNP: Invalid input(s): POCBNP  CBG: Recent Labs  Lab 06/24/24 1248 06/24/24 1659 06/24/24 2103 06/25/24 0737 06/25/24 1132  GLUCAP 146* 219* 269* 229* 233*    Microbiology: Results for orders placed or performed during the hospital encounter of 06/07/23  Blood culture (routine x 2)     Status: None   Collection Time: 06/07/23 12:54 PM  Specimen: BLOOD  Result Value Ref Range Status   Specimen Description BLOOD RIGHT ANTECUBITAL  Final   Special Requests   Final    BOTTLES DRAWN AEROBIC AND ANAEROBIC Blood Culture adequate volume   Culture   Final    NO GROWTH 5 DAYS Performed at [facility], [address] [place], [place] [address]    Report Status 06/12/2023 FINAL  Final  Blood culture (routine x 2)     Status: None   Collection Time: 06/07/23 12:54 PM   Specimen: BLOOD  Result Value Ref Range Status   Specimen Description BLOOD BLOOD  RIGHT FOREARM  Final   Special Requests   Final    BOTTLES DRAWN AEROBIC AND ANAEROBIC Blood Culture adequate volume   Culture   Final    NO GROWTH 5 DAYS Performed at [facility], [address] [place], [place] [address]    Report Status 06/12/2023 FINAL  Final  MRSA Next Gen by PCR, Nasal     Status: None   Collection Time: 06/07/23 10:18 PM   Specimen: Nasal Mucosa; Nasal Swab  Result Value Ref Range Status   MRSA by PCR Next Gen NOT DETECTED NOT DETECTED Final    Comment: (NOTE) The GeneXpert MRSA Assay (FDA approved for NASAL specimens only), is one component of a comprehensive MRSA colonization surveillance program. It is not intended to diagnose MRSA infection nor to guide or monitor treatment for MRSA infections. Test performance is not FDA approved in patients less than 84 years old. Performed at [facility], [address] [place], [place] [address]     Coagulation Studies: No results for input(s): LABPROT, INR in the last 72 hours.  Urinalysis: No results for input(s): COLORURINE, LABSPEC, PHURINE, GLUCOSEU, HGBUR, BILIRUBINUR, KETONESUR, PROTEINUR, UROBILINOGEN, NITRITE, LEUKOCYTESUR in the last 72 hours.  Invalid input(s): APPERANCEUR    Imaging:    Medications:      allopurinol   100 mg Oral Daily   apixaban   2.5 mg Oral BID   benzonatate   100 mg Oral TID   bisacodyl   5 mg Oral Daily   Chlorhexidine  Gluconate Cloth  6 each Topical Q0600   cholecalciferol   1,000 Units Oral Daily   cinacalcet   30 mg Oral Q supper   droxidopa   200 mg Oral TID with meals   ezetimibe   10 mg Oral QPM   ferric citrate   420 mg Oral TID WC   fludrocortisone   0.2 mg Oral BID   insulin  aspart  0-5 Units Subcutaneous QHS   insulin  aspart  0-6 Units Subcutaneous TID WC   insulin  aspart  2 Units Subcutaneous TID WC   insulin  glargine-yfgn  5 Units Subcutaneous Daily   ipratropium-albuterol   3 mL Nebulization BID    methylPREDNISolone  (SOLU-MEDROL ) injection  40 mg Intravenous Daily   midodrine   10 mg Oral TID WC   pantoprazole   40 mg Oral Daily   rosuvastatin   10 mg Oral QPM   acetaminophen  **OR** acetaminophen , bisacodyl , chlorpheniramine-HYDROcodone , magnesium  hydroxide, methocarbamol , midodrine , ondansetron  **OR** ondansetron  (ZOFRAN ) IV, oxyCODONE -acetaminophen , sodium phosphate, traZODone   Assessment/ Plan:  [name] is a 78 y.o.  female with a PMHx of ESRD on HD, diabetes mellitus type 2, chronic hypotension, peripheral arterial disease, obstructive sleep apnea, GERD, obesity, hyperlipidemia, anemia chronic kidney disease, secondary hyperparathyroidism.  Patient presented to the emergency department with multiple complaints from the rehab facility.  Complaints including shoulder pain, abdominal pain, headaches, and vaginal discharge.  Patient has been admitted for COPD  exacerbation (HCC) [J44.1] Upper abdominal pain [R10.10] Generalized headache [R51.9] COPD with acute exacerbation (HCC) [J44.1] Closed fracture of multiple ribs of right side, initial encounter [S22.41XA]  [facility]/TTS/Lt AVF/104.6kg  End-stage renal disease with hyperkalemia on hemodialysis.  Patient completed dialysis treatment yesterday.  Tolerated well.  No acute indication for dialysis treatment today.  She will resume normal outpatient dialysis treatments tomorrow.  2. Anemia of chronic kidney disease Lab Results  Component Value Date   HGB 11.8 (L) 06/25/2024    Patient to continue Mircera as outpatient.  3. Secondary Hyperparathyroidism: with outpatient labs: PTH 70, phosphorus 2.1, calcium  8.1 on 05/04/2024.   Lab Results  Component Value Date   PTH 388.6 (H) 01/17/2014   CALCIUM  7.7 (L) 06/25/2024   CAION 0.92 (L) 06/11/2023   PHOS 5.3 (H) 06/24/2024    Phosphorus at target at 5.3.  Patient to be maintained on current dosage of Cinacalcet .  4. Diabetes mellitus type II with chronic kidney  disease/renal manifestations: insulin  dependent. Home regimen includes aspart and glargine. Most recent hemoglobin A1c is 6.0.     LOS: 4 Luvina Poirier 7/25/20255:00 PM

## 2024-06-25 NOTE — TOC Initial Note (Signed)
 Transition of Care [facility]) - Initial/Assessment Note    Patient Details  Name: Jacqueline [name] MRN: [id number] Date of Birth: [birth year]  [facility] CM/SW Contact:    Elouise LULLA Capri, RN 06/25/2024, 12:06 PM  Clinical Narrative:                  CM to patient's room regarding case management assessment. Patient's brother at patient's bedside. Patient states is from [facility] and anticipates return to SNF upon discharge.   CM will perform SNF workup. Expected Discharge Plan: Skilled Nursing Facility Barriers to Discharge: No Barriers Identified   Patient Goals and CMS Choice    SNF  Expected Discharge Plan and Services    SNF     Expected Discharge Date: 06/25/24                Prior Living Arrangements/Services     Patient language and need for interpreter reviewed:: No        Need for Family Participation in Patient Care: Yes (Comment) Care giver support system in place?: Yes (comment)   Criminal Activity/Legal Involvement Pertinent to Current Situation/Hospitalization: No - Comment as needed  Activities of Daily Living   ADL Screening (condition at time of admission) Independently performs ADLs?: Yes (appropriate for developmental age) Is the patient deaf or have difficulty hearing?: No Does the patient have difficulty seeing, even when wearing glasses/contacts?: No Does the patient have difficulty concentrating, remembering, or making decisions?: No  Permission Sought/Granted Permission sought to share information with : Case Manager, Family Supports    Share Information with NAME: [name] Council blades, phone: [phone]; Abraham Council, son, phone: [phone]; Reena Lay, sister, phone: [phone];  Boby Leyden, sister, phone: [phone]; Helayne Sayres, brother, phone: [phone]           Emotional Assessment Appearance:: Appears stated age Attitude/Demeanor/Rapport: Engaged Affect (typically observed):  Calm Orientation: : Oriented to Self, Oriented to Place, Oriented to Situation      Admission diagnosis:  COPD exacerbation (HCC) [J44.1] Upper abdominal pain [R10.10] Generalized headache [R51.9] COPD with acute exacerbation (HCC) [J44.1] Closed fracture of multiple ribs of right side, initial encounter [S22.41XA] Patient Active Problem List   Diagnosis Date Noted   Acute bronchitis 06/22/2024   Obesity (BMI 30-39.9) 06/22/2024   Atrial fibrillation, chronic (HCC) 06/21/2024   Goals of care, counseling/discussion 06/12/2023   Severe pulmonary hypertension (HCC) 06/10/2023   Morbid obesity (HCC) 06/08/2023   Hyperkalemia 09/30/2022   Constipation 09/30/2022   AMS (altered mental status) 09/28/2022   Hypotension    Permanent atrial fibrillation (HCC)    Pressure injury of skin 05/22/2022   General weakness 05/22/2022   Weakness 05/21/2022   Anaphylactic shock, unspecified, initial encounter 06/29/2020   Sepsis (HCC) 06/22/2019   Pain due to onychomycosis of toenails of both feet 06/11/2019   Callus 06/11/2019   Other disorders of phosphorus metabolism 05/20/2019   Hyperlipidemia, unspecified 03/22/2019   Otalgia of both ears 03/22/2019   Right arm pain 03/22/2019   Bradycardia 10/01/2017   Dyslipidemia 10/01/2017   Dependence on renal dialysis (HCC) 09/01/2017   Chest pain, unspecified 12/06/2016   Non-ST elevation (NSTEMI) myocardial infarction (HCC) 12/06/2016   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   History of NSTEMI    CAD (coronary artery disease) 12/03/2016   Hypokalemia 05/03/2016   Pruritus, unspecified 03/27/2015   Encounter for removal of sutures 11/30/2014   Pain in lower limb 08/26/2014   Encounter for immunization 06/10/2014   Unspecified protein-calorie malnutrition (  HCC) 02/28/2014   Allergy, unspecified, initial encounter 02/15/2014   HCAP (healthcare-associated pneumonia) 02/13/2014   Absence of both cervix and uterus, acquired 02/01/2014    Coagulation defect, unspecified (HCC) 02/01/2014   Dyspnea, unspecified 02/01/2014   Family history of diabetes mellitus 02/01/2014   Family history of ischemic heart disease and other diseases of the circulatory system 02/01/2014   Fever, unspecified 02/01/2014   Headache, unspecified 02/01/2014   History of allergy to sulfonamides 02/01/2014   Iron  deficiency anemia, unspecified 02/01/2014   Long term (current) use of insulin  (HCC) 02/01/2014   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   OSA (obstructive sleep apnea) 01/21/2014   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HCC) 01/21/2014   Acute encephalopathy 01/19/2014   Congestive heart failure (HCC) 01/15/2014   ESRD (end stage renal disease) (HCC) 01/15/2014   Hypertension 01/15/2014   Type 2 diabetes mellitus with end-stage renal disease (HCC) 01/15/2014   Anemia 01/15/2014   Onychomycosis 03/26/2013   Pain in joint, ankle and foot 03/26/2013   PCP:  Austin Candyce Stall, MD Pharmacy:   [facility]) - [place], [place] - [address] [facility] [address] [place] [address] [place] [address] Phone: [phone] Fax: [phone]  [facility] [address], [place] - [address] [place] [address] Phone: [phone] Fax: [phone]  [facility]. [place], [place] - [address] [place] [address] Phone: [phone] Fax: [phone]     Social Drivers of Health (SDOH) Social History: SDOH Screenings   Food Insecurity: No Food Insecurity (06/21/2024)  Housing: Low Risk  (06/21/2024)  Transportation Needs: No Transportation Needs (06/21/2024)  Utilities: Not At Risk (06/21/2024)  Depression (PHQ2-9): Low Risk  (06/27/2021)  Financial Resource Strain: Medium Risk (06/27/2021)  Physical Activity: Inactive (06/27/2021)  Social Connections: Moderately Integrated (06/21/2024)  Stress: No Stress Concern Present (06/27/2021)  Tobacco Use: Medium Risk  (01/27/2024)   SDOH Interventions:     Readmission Risk Interventions    05/22/2022   12:26 PM  Readmission Risk Prevention Plan  Transportation Screening Complete  PCP or Specialist Appt within 3-5 Days Complete  HRI or Home Care Consult Complete  Social Work Consult for Recovery Care Planning/Counseling Complete  Palliative Care Screening Complete  Medication Review Oceanographer) Complete

## 2024-06-25 NOTE — [place] FL2 (Signed)
 [place]  MEDICAID FL2 LEVEL OF CARE FORM     IDENTIFICATION  Patient Name: Jacqueline [name] Birthdate: 28-Feb-1946 Sex: female Admission Date (Current Location): 06/20/2024  [place] and [place] Number:  [place] [id number] Facility and Address:  [facility], [address], [place], [place] [address]      Provider Number: [id number]  Attending Physician Name and Address:  Arlon Carliss ORN, DO  Relative Name and Phone Number:  [name] Spiegel, son, [phone]; Abraham Spiegel, son, phone: [phone]; Reena Lay, sister, phone: [phone]; Boby Leyden, sister, phone: [phone]; Helayne Sayres, brother, phone: [phone]    Current Level of Care: Hospital Recommended Level of Care: Skilled Nursing Facility Prior Approval Number:    Date Approved/Denied: 01/26/14 PASRR Number: [id number]  Discharge Plan: SNF    Current Diagnoses: Patient Active Problem List   Diagnosis Date Noted   Acute bronchitis 06/22/2024   Obesity (BMI 30-39.9) 06/22/2024   Atrial fibrillation, chronic (HCC) 06/21/2024   Goals of care, counseling/discussion 06/12/2023   Severe pulmonary hypertension (HCC) 06/10/2023   Morbid obesity (HCC) 06/08/2023   Hyperkalemia 09/30/2022   Constipation 09/30/2022   AMS (altered mental status) 09/28/2022   Hypotension    Permanent atrial fibrillation (HCC)    Pressure injury of skin 05/22/2022   General weakness 05/22/2022   Weakness 05/21/2022   Anaphylactic shock, unspecified, initial encounter 06/29/2020   Sepsis (HCC) 06/22/2019   Pain due to onychomycosis of toenails of both feet 06/11/2019   Callus 06/11/2019   Other disorders of phosphorus metabolism 05/20/2019   Hyperlipidemia, unspecified 03/22/2019   Otalgia of both ears 03/22/2019   Right arm pain 03/22/2019   Bradycardia 10/01/2017   Dyslipidemia 10/01/2017   Dependence on renal dialysis (HCC) 09/01/2017   Chest pain, unspecified 12/06/2016   Non-ST  elevation (NSTEMI) myocardial infarction (HCC) 12/06/2016   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   History of NSTEMI    CAD (coronary artery disease) 12/03/2016   Hypokalemia 05/03/2016   Pruritus, unspecified 03/27/2015   Encounter for removal of sutures 11/30/2014   Pain in lower limb 08/26/2014   Encounter for immunization 06/10/2014   Unspecified protein-calorie malnutrition (HCC) 02/28/2014   Allergy, unspecified, initial encounter 02/15/2014   HCAP (healthcare-associated pneumonia) 02/13/2014   Absence of both cervix and uterus, acquired 02/01/2014   Coagulation defect, unspecified (HCC) 02/01/2014   Dyspnea, unspecified 02/01/2014   Family history of diabetes mellitus 02/01/2014   Family history of ischemic heart disease and other diseases of the circulatory system 02/01/2014   Fever, unspecified 02/01/2014   Headache, unspecified 02/01/2014   History of allergy to sulfonamides 02/01/2014   Iron  deficiency anemia, unspecified 02/01/2014   Long term (current) use of insulin  (HCC) 02/01/2014   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   OSA (obstructive sleep apnea) 01/21/2014   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HCC) 01/21/2014   Acute encephalopathy 01/19/2014   Congestive heart failure (HCC) 01/15/2014   ESRD (end stage renal disease) (HCC) 01/15/2014   Hypertension 01/15/2014   Type 2 diabetes mellitus with end-stage renal disease (HCC) 01/15/2014   Anemia 01/15/2014   Onychomycosis 03/26/2013   Pain in joint, ankle and foot 03/26/2013    Orientation RESPIRATION BLADDER Height & Weight     Self, Time, Situation, Place  O2 (1LPM oxygen ) Continent Weight: 105.3 kg Height:  5' 5 (165.1 cm)  BEHAVIORAL SYMPTOMS/MOOD NEUROLOGICAL BOWEL NUTRITION STATUS      Continent  (Please see discharge summary)  AMBULATORY STATUS COMMUNICATION OF NEEDS Skin   Limited Assist    (  Flaky, scratches, non-tenting)                       Personal Care Assistance Level of  Assistance  Bathing, Feeding, Dressing Bathing Assistance: Limited assistance Feeding assistance: Limited assistance Dressing Assistance: Limited assistance     Functional Limitations Info             SPECIAL CARE FACTORS FREQUENCY  PT (By licensed PT), OT (By licensed OT)     PT Frequency: 5 x per week OT Frequency: 5 x per week            Contractures      Additional Factors Info  Code Status, Allergies Code Status Info: Full  Code Allergies Info: Sulfa (sulfonamide Antibiotics)    Received from outside source  No severity specified - Other (See Comments) Comments  Sulfa Antibiotics  No severity specified - Other (See Comments)  Sulfa Drugs Cross Reactors  No severity specified - Other (See Comments) Comments           Current Medications (06/25/2024):  This is the current hospital active medication list Current Facility-Administered Medications  Medication Dose Route Frequency Provider Last Rate Last Admin   acetaminophen  (TYLENOL ) tablet 650 mg  650 mg Oral Q6H PRN Mansy, Jan A, MD       Or   acetaminophen  (TYLENOL ) suppository 650 mg  650 mg Rectal Q6H PRN Mansy, Jan A, MD       allopurinol  (ZYLOPRIM ) tablet 100 mg  100 mg Oral Daily Alto Rake L, NP   100 mg at 06/25/24 0900   apixaban  (ELIQUIS ) tablet 2.5 mg  2.5 mg Oral BID Alto Rake CROME, NP   2.5 mg at 06/25/24 0900   benzonatate  (TESSALON ) capsule 100 mg  100 mg Oral TID Josette Ade, MD   100 mg at 06/25/24 0900   bisacodyl  (DULCOLAX) EC tablet 5 mg  5 mg Oral Daily Arlon Honey W, DO   5 mg at 06/25/24 0900   bisacodyl  (DULCOLAX) suppository 10 mg  10 mg Rectal Daily PRN Arlon Honey ORN, DO       Chlorhexidine  Gluconate Cloth 2 % PADS 6 each  6 each Topical Q0600 Druscilla Bald, NP   6 each at 06/25/24 0624   chlorpheniramine-HYDROcodone  (TUSSIONEX) 10-8 MG/5ML suspension 5 mL  5 mL Oral Q12H PRN Josette Ade, MD   5 mL at 06/24/24 0155   cholecalciferol  (VITAMIN D3) 25 MCG (1000 UNIT)  tablet 1,000 Units  1,000 Units Oral Daily Druscilla Bald, NP   1,000 Units at 06/25/24 0900   cinacalcet  (SENSIPAR ) tablet 30 mg  30 mg Oral Q supper Alto Rake CROME, NP   30 mg at 06/24/24 1713   droxidopa  (NORTHERA ) capsule 200 mg  200 mg Oral TID with meals Alto Rake CROME, NP   200 mg at 06/25/24 1233   ezetimibe  (ZETIA ) tablet 10 mg  10 mg Oral QPM Alto Rake CROME, NP   10 mg at 06/24/24 1713   ferric citrate  (AURYXIA ) tablet 420 mg  420 mg Oral TID WC Alto Rake CROME, NP   420 mg at 06/25/24 1233   fludrocortisone  (FLORINEF ) tablet 0.2 mg  0.2 mg Oral BID Alto Rake CROME, NP   0.2 mg at 06/25/24 0900   insulin  aspart (novoLOG ) injection 0-5 Units  0-5 Units Subcutaneous QHS Alto Rake CROME, NP   3 Units at 06/24/24 2144   insulin  aspart (novoLOG ) injection 0-6 Units  0-6 Units Subcutaneous TID  WC Alto Isaiah CROME, NP   2 Units at 06/25/24 1223   insulin  aspart (novoLOG ) injection 2 Units  2 Units Subcutaneous TID WC Josette Ade, MD   2 Units at 06/25/24 1222   insulin  glargine-yfgn (SEMGLEE ) injection 5 Units  5 Units Subcutaneous Daily Alto Isaiah CROME, NP   5 Units at 06/25/24 0903   ipratropium-albuterol  (DUONEB) 0.5-2.5 (3) MG/3ML nebulizer solution 3 mL  3 mL Nebulization BID Josette Ade, MD   3 mL at 06/25/24 9277   magnesium  hydroxide (MILK OF MAGNESIA) suspension 30 mL  30 mL Oral Daily PRN Mansy, Jan A, MD   30 mL at 06/24/24 9362   methocarbamol  (ROBAXIN ) tablet 500 mg  500 mg Oral Q8H PRN Alto Isaiah CROME, NP       methylPREDNISolone  sodium succinate  (SOLU-MEDROL ) 40 mg/mL injection 40 mg  40 mg Intravenous Daily Josette Ade, MD   40 mg at 06/25/24 0900   midodrine  (PROAMATINE ) tablet 10 mg  10 mg Oral TID WC Alto Isaiah CROME, NP   10 mg at 06/25/24 1223   midodrine  (PROAMATINE ) tablet 20 mg  20 mg Oral PRN Alto Isaiah CROME, NP   20 mg at 06/22/24 9146   ondansetron  (ZOFRAN ) tablet 4 mg  4 mg Oral Q6H PRN Mansy, Jan A, MD       Or   ondansetron  (ZOFRAN )  injection 4 mg  4 mg Intravenous Q6H PRN Mansy, Jan A, MD       oxyCODONE -acetaminophen  (PERCOCET/ROXICET) 5-325 MG per tablet 1-2 tablet  1-2 tablet Oral Q4H PRN Alto Isaiah CROME, NP   1 tablet at 06/23/24 2118   pantoprazole  (PROTONIX ) EC tablet 40 mg  40 mg Oral Daily Alto Isaiah CROME, NP   40 mg at 06/25/24 0900   rosuvastatin  (CRESTOR ) tablet 10 mg  10 mg Oral QPM Alto Isaiah CROME, NP   10 mg at 06/24/24 1713   sodium phosphate (FLEET) enema 1 enema  1 enema Rectal Daily PRN Arlon Carliss ORN, DO       traZODone  (DESYREL ) tablet 25 mg  25 mg Oral QHS PRN Mansy, Madison LABOR, MD         Discharge Medications: Please see discharge summary for a list of discharge medications.  Relevant Imaging Results:  Relevant Lab Results:   Additional Information SSN:[id number]  Elouise LULLA Capri, RN

## 2024-06-25 NOTE — Plan of Care (Signed)

## 2024-06-30 ENCOUNTER — Encounter: Payer: Self-pay | Admitting: Pulmonary Disease

## 2024-06-30 ENCOUNTER — Ambulatory Visit (INDEPENDENT_AMBULATORY_CARE_PROVIDER_SITE_OTHER): Admitting: Pulmonary Disease

## 2024-06-30 VITALS — BP 128/78 | HR 77 | Temp 97.1°F | Ht 65.0 in | Wt 235.0 lb

## 2024-06-30 DIAGNOSIS — E1122 Type 2 diabetes mellitus with diabetic chronic kidney disease: Secondary | ICD-10-CM

## 2024-06-30 DIAGNOSIS — R0602 Shortness of breath: Secondary | ICD-10-CM

## 2024-06-30 DIAGNOSIS — J9611 Chronic respiratory failure with hypoxia: Secondary | ICD-10-CM

## 2024-06-30 DIAGNOSIS — J398 Other specified diseases of upper respiratory tract: Secondary | ICD-10-CM | POA: Diagnosis not present

## 2024-06-30 DIAGNOSIS — I272 Pulmonary hypertension, unspecified: Secondary | ICD-10-CM | POA: Diagnosis not present

## 2024-06-30 DIAGNOSIS — I482 Chronic atrial fibrillation, unspecified: Secondary | ICD-10-CM | POA: Diagnosis not present

## 2024-06-30 DIAGNOSIS — I2510 Atherosclerotic heart disease of native coronary artery without angina pectoris: Secondary | ICD-10-CM

## 2024-06-30 DIAGNOSIS — N186 End stage renal disease: Secondary | ICD-10-CM

## 2024-06-30 NOTE — Progress Notes (Signed)
 Subjective:    Patient ID: Jacqueline [name], female    DOB: Aug 06, 1946, 78 y.o.   MRN: [id number]  Patient Care Team: Austin Candyce Stall, MD as PCP - General (Internal Medicine) Darron Deatrice LABOR, MD as PCP - Cardiology (Cardiology) Fernande Elspeth BROCKS, MD as PCP - Electrophysiology (Cardiology) Arnell Rockney PARAS, [facility] (Inactive) (Pharmacist) Tobie Gordy POUR, MD as Consulting Physician (Nephrology)  Chief Complaint  Patient presents with   Consult    Cough with yellow/clear sputum for months. No SOB. Wheezing. Has been on O2 for 2 years.     BACKGROUND: Patient is an 78 year old remote former smoker who presents for follow-up on the issue of a cough of apparently several months duration.  She has end-stage renal disease as and is on dialysis Tuesday Thursday Saturdays.  She has known severe pulmonary hypertension.  She has chronic hypoxic respiratory failure likely related to pulmonary hypertension.  Recent hospitalization at [facility] from 20 June 2024 to 25 June 2024 for hyperkalemia, acute bronchitis, and rib fractures.   HPI Discussed the use of AI scribe software for clinical note transcription with the patient, who gave verbal consent to proceed.  History of Present Illness   Jacqueline [name] is an 78 year old female with pulmonary hypertension who presents with a persistent cough and rib pain. She is accompanied by her son, Jacqueline [name].  She has been experiencing a severe cough for an extended period, which has resulted in rib fractures confirmed by imaging during a recent hospitalization. Initially, x-rays showed bronchitis. The cough is productive, with significant phlegm production that has decreased since starting steroid and antibiotic treatment. No recent fevers or chills, but she recalls having a fever once. Sleep problems are present, but she reports minimal snoring.  She is currently on a steroid regimen, which has led to increased blood sugar and blood pressure, as well as constipation.  Albuterol  nebulizers provide some relief for her cough, though she is unsure if she has been given Duoneb. She has been on oxygen  therapy since May 2023 following a hospitalization, initially at two liters per minute, but was advised to reduce to one liter. She feels better with the reduced oxygen .  She is on dialysis three times a week on Tuesday, Thursday, and Saturday and has not missed any sessions. She has a history of smoking in her youth but quit in her twenties. 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 She is interested in obtaining a portable oxygen  tank to increase her mobility at the nursing home.   She does not endorse any other symptoms.  She appears very deconditioned and has limited mobility at baseline.  She is a long-term skilled nursing facility patient.  She does not offer any more history details.  Her son is unaware of any other issues     DATA 06/08/2023 echocardiogram: LVEF 60-65 LV without regional wall motion abnormalities, indeterminate LV diastolic parameters.  Right ventricular systolic function is moderately reduced, right ventricular size is moderately enlarged.  There is severely elevated pulmonary artery systolic pressure with the estimated right ventricular systolic pressure at 79 mmHg.  Left atrial size mildly dilated.  Mitral valve is normal in structure.  Mild mitral valve regurgitation.  No evidence of mitral stenosis.  Tricuspid valve regurgitation is moderate.  Aortic valve sclerosis without stenosis.  Inferior vena cava is dilated atrial pressure estimated at 15 mmHg. 06/11/2023 right/left heart cath: Mild to nonobstructive coronary artery disease, widely patent proximal RCA stent.  Mild to  moderately elevated left heart filling pressures.  Severely elevated right heart and pulmonary artery pressures.  Pulmonary artery pressure does not exceed systemic pressure.  Mildly reduced cardiac output/index.  PA 73/30 (44) mmHg. PCWP (mean) 30 mmHg with prominent  v waves.  LV end-diastolic pressure is mildly elevated.  LVEDP 22 mmHg, no aortic valve stenosis. 06/21/2024 CT angio chest: No PE.  Mild mosaic attenuation/groundglass opacity in the lungs bilaterally consistent with volume overload.  Mild atelectasis of the lingula and bilateral lower lobes.  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   Past Medical History:  Diagnosis Date   Arthritis    left knee (12/04/2016)   CAD (coronary artery disease)    a. 12/2016 NSTEMI/PCI: LM nl, LaD 20p/d, D1 20, OM2 20, OM3 20, RCA 36m (3.0x18 Resolute DES). EF 65%.   Chronic lower back pain    Diverticulosis    on CT   ESRD on dialysis [facility])    GERD (gastroesophageal reflux disease)    Hiatal hernia    History of echocardiogram    a. 09/2022 Echo: EF 60-65%, mild asymm LVH, mod reduced RV fxn, RVSP . Mildly dil RA. Mild AI. Ao sclerosis. Mild MR.   Hyperlipidemia    Hypertension    a. 12/2016 labetalol  d/c'd 2/2 hypotension in HD.   Morbid obesity (HCC)    Obesity    Persistent atrial fibrillation (HCC)    a. 05/2023 Zio: 100% afib avg HR 52 (32-99).   Pneumonia    couple times (12/04/2016)   Symptomatic bradycardia 06/07/2023   Type II diabetes mellitus [facility])     Past Surgical History:  Procedure Laterality Date   A/V FISTULAGRAM N/A 11/12/2023   Procedure: A/V Fistulagram;  Surgeon: Melia Lynwood ORN, MD;  Location: [facility] INVASIVE CV LAB;  Service: Cardiovascular;  Laterality: N/A;   ABDOMINAL HYSTERECTOMY     AV FISTULA PLACEMENT Left 01/27/2014   Procedure: ARTERIOVENOUS (AV) FISTULA CREATION;  Surgeon: Lynwood JONETTA Collum, MD;  Location: [facility] OR;  Service: Vascular;  Laterality: Left;   AV FISTULA REPAIR     had it cleaned out   CARDIAC CATHETERIZATION  ?1980s   CARDIAC CATHETERIZATION N/A 12/04/2016   Procedure: Left Heart Cath  Angiography;  Surgeon: Lonni JONETTA Cash, MD;  Location: [facility] INVASIVE CV LAB;  Service: Cardiovascular;  Laterality: N/A;   CARDIAC CATHETERIZATION N/A 12/04/2016   Procedure: Coronary Stent Intervention;  Surgeon: Lonni JONETTA Cash, MD;  Location: [facility] INVASIVE CV LAB;  Service: Cardiovascular;  Laterality: N/A;  Mid RCA   CATARACT EXTRACTION W/ INTRAOCULAR LENS IMPLANT Left    COLONOSCOPY W/ BIOPSIES AND POLYPECTOMY     CORONARY ANGIOPLASTY WITH STENT PLACEMENT  12/04/2016   ESOPHAGOGASTRODUODENOSCOPY     INSERTION OF DIALYSIS CATHETER N/A 01/19/2014   Procedure: INSERTION OF DIALYSIS CATHETER;  Surgeon: Krystal JULIANNA Doing, MD;  Location: [facility] OR;  Service: Vascular;  Laterality: N/A;   PERIPHERAL VASCULAR BALLOON ANGIOPLASTY  11/12/2023   Procedure: PERIPHERAL VASCULAR BALLOON ANGIOPLASTY;  Surgeon: Melia Lynwood ORN, MD;  Location: [facility] INVASIVE CV LAB;  Service: Cardiovascular;;   RIGHT/LEFT HEART CATH AND CORONARY ANGIOGRAPHY N/A 06/11/2023   Procedure: RIGHT/LEFT HEART CATH AND CORONARY ANGIOGRAPHY;  Surgeon: Mady Lonni, MD;  Location: [facility] INVASIVE CV LAB;  Service: Cardiovascular;  Laterality: N/A;    Patient Active Problem List  Diagnosis Date Noted   Acute bronchitis 06/22/2024   Obesity (BMI 30-39.9) 06/22/2024   Atrial fibrillation, chronic (HCC) 06/21/2024   Goals of care, counseling/discussion 06/12/2023   Severe pulmonary hypertension (HCC) 06/10/2023   Morbid obesity (HCC) 06/08/2023   Hyperkalemia 09/30/2022   Constipation 09/30/2022   AMS (altered mental status) 09/28/2022   Hypotension    Permanent atrial fibrillation (HCC)    Pressure injury of skin 05/22/2022   General weakness 05/22/2022   Weakness 05/21/2022   Anaphylactic shock, unspecified, initial encounter 06/29/2020   Sepsis (HCC) 06/22/2019   Pain due to onychomycosis of toenails of both feet 06/11/2019   Callus 06/11/2019   Other disorders of phosphorus metabolism 05/20/2019   Hyperlipidemia, unspecified  03/22/2019   Otalgia of both ears 03/22/2019   Right arm pain 03/22/2019   Bradycardia 10/01/2017   Dyslipidemia 10/01/2017   Dependence on renal dialysis (HCC) 09/01/2017   Chest pain, unspecified 12/06/2016   Non-ST elevation (NSTEMI) myocardial infarction (HCC) 12/06/2016   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   History of NSTEMI    CAD (coronary artery disease) 12/03/2016   Hypokalemia 05/03/2016   Pruritus, unspecified 03/27/2015   Encounter for removal of sutures 11/30/2014   Pain in lower limb 08/26/2014   Encounter for immunization 06/10/2014   Unspecified protein-calorie malnutrition (HCC) 02/28/2014   Allergy, unspecified, initial encounter 02/15/2014   HCAP (healthcare-associated pneumonia) 02/13/2014   Absence of both cervix and uterus, acquired 02/01/2014   Coagulation defect, unspecified (HCC) 02/01/2014   Dyspnea, unspecified 02/01/2014   Family history of diabetes mellitus 02/01/2014   Family history of ischemic heart disease and other diseases of the circulatory system 02/01/2014   Fever, unspecified 02/01/2014   Headache, unspecified 02/01/2014   History of allergy to sulfonamides 02/01/2014   Iron  deficiency anemia, unspecified 02/01/2014   Long term (current) use of insulin  (HCC) 02/01/2014   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   OSA (obstructive sleep apnea) 01/21/2014   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HCC) 01/21/2014   Acute encephalopathy 01/19/2014   Congestive heart failure (HCC) 01/15/2014   ESRD (end stage renal disease) (HCC) 01/15/2014   Hypertension 01/15/2014   Type 2 diabetes mellitus with end-stage renal disease (HCC) 01/15/2014   Anemia 01/15/2014   Onychomycosis 03/26/2013   Pain in joint, ankle and foot 03/26/2013    Family History  Problem Relation Age of Onset   Diabetes Mother    Peripheral vascular disease Mother        amputation   CAD Father    Heart disease Father    Hypertension Father    Heart attack  Father    CAD Sister    Cancer Brother        x 2 brothers, type unknown   Breast cancer Sister     Social History   Tobacco Use   Smoking status: Former    Current packs/day: 0.00    Average packs/day: 1 pack/day for 20.0 years (20.0 ttl pk-yrs)    Types: Cigarettes    Start date: 17    Quit date: 1985    Years since quitting: 40.6   Smokeless tobacco: Never  Substance Use Topics   Alcohol  use: No    Alcohol /week: 0.0 standard drinks of alcohol     Allergies  Allergen Reactions   Sulfa Antibiotics Other (See Comments)   Sulfa Drugs Cross Reactors Other (See Comments)    Doesn't remember     Current Meds  Medication Sig   acetaminophen  (TYLENOL ) 650 MG CR tablet Take  650 mg by mouth every 8 (eight) hours. (0600, 1400 & 2200)   albuterol  (PROVENTIL ) (2.5 MG/3ML) 0.083% nebulizer solution Take 2.5 mg by nebulization every 6 (six) hours as needed for wheezing or shortness of breath.   allopurinol  (ZYLOPRIM ) 100 MG tablet Take 100 mg by mouth daily.   aluminum -magnesium  hydroxide-simethicone  (MAALOX) 200-200-20 MG/5ML SUSP Take 30 mLs by mouth 4 (four) times daily -  before meals and at bedtime.   apixaban  (ELIQUIS ) 2.5 MG TABS tablet Take 1 tablet (2.5 mg total) by mouth 2 (two) times daily.   benzonatate  (TESSALON ) 100 MG capsule Take 1 capsule (100 mg total) by mouth 3 (three) times daily.   bisacodyl  (DULCOLAX) 10 MG suppository Place 10 mg rectally daily as needed for moderate constipation.   cinacalcet  (SENSIPAR ) 30 MG tablet Take 30 mg by mouth daily with supper. (1800)   clotrimazole-betamethasone (LOTRISONE) cream Apply 1 Application topically See admin instructions. Apply as needed to affected area of ear for itching/irritation.   dextromethorphan -guaiFENesin  (MUCINEX  DM) 30-600 MG 12hr tablet Take 1 tablet by mouth 2 (two) times daily. (0900 & 1800)   Droxidopa  200 MG CAPS Take 200 mg by mouth with breakfast, with lunch, and with evening meal.   ezetimibe  (ZETIA ) 10  MG tablet Take 1 tablet (10 mg total) by mouth daily.   ferric citrate  (AURYXIA ) 1 GM 210 MG(Fe) tablet Take 420 mg by mouth 3 (three) times daily with meals.   fexofenadine (ALLEGRA) 180 MG tablet Take 180 mg by mouth in the morning. (0800)   fludrocortisone  (FLORINEF ) 0.1 MG tablet Take 2 tablets (0.2 mg total) by mouth 2 (two) times daily.   fluticasone (FLONASE) 50 MCG/ACT nasal spray Place 1 spray into both nostrils in the morning. (0900)   guaiFENesin -dextromethorphan  (ROBITUSSIN DM) 100-10 MG/5ML syrup Take 15 mLs by mouth every 4 (four) hours as needed for cough.   HYDROcodone -acetaminophen  (NORCO/VICODIN) 5-325 MG tablet Take 1-2 tablets by mouth every 6 (six) hours as needed for moderate pain.   insulin  aspart (NOVOLOG ) 100 UNIT/ML injection Inject 0-20 Units into the skin 3 (three) times daily with meals. For CBG 70-120: 0 units For CBG 121-150: 3 units For CBG 151-200: 4 units For CBG 201-250: 7 units For CBG 251-300: 11 units For CBG 301-350: 15 units For CBG 351-400: 20 units For CBG > 400: 25 units and call your doctor   insulin  glargine-yfgn (SEMGLEE ) 100 UNIT/ML injection Inject 0.05 mLs (5 Units total) into the skin daily.   ipratropium-albuterol  (DUONEB) 0.5-2.5 (3) MG/3ML SOLN Take 3 mLs by nebulization 2 (two) times daily.   Lidocaine  HCl (ASPERCREME LIDOCAINE ) 4 % CREA Apply 1 Application topically in the morning and at bedtime. Applied to bilateral knees   magnesium  hydroxide (MILK OF MAGNESIA) 400 MG/5ML suspension Take 30 mLs by mouth daily as needed (if no BM in 3 days.).   Menthol -Zinc Oxide (MOISTURE BARRIER EX) Apply 1 Application topically as directed. Apply barrier cream to buttocks every shift   midodrine  (PROAMATINE ) 10 MG tablet Take 1 tablet (10 mg total) by mouth 3 (three) times daily with meals.   midodrine  (PROAMATINE ) 10 MG tablet Take 20 mg by mouth as needed (for systolic BP less than 100.).   Multiple Vitamin (MULTIVITAMIN WITH MINERALS) TABS tablet  Take 1 tablet by mouth at bedtime.   naloxone (NARCAN) nasal spray 4 mg/0.1 mL Place 1 spray into the nose 3 (three) times daily as needed (signs and symptoms of opioid overdose).   nitroGLYCERIN  (NITROSTAT ) 0.4 MG  SL tablet Place 1 tablet (0.4 mg total) under the tongue every 5 (five) minutes as needed for chest pain.   ondansetron  (ZOFRAN -ODT) 4 MG disintegrating tablet Take 4 mg by mouth every 8 (eight) hours as needed for nausea or vomiting.   OXYGEN  Inhale 2 L/min into the lungs as needed (shortness of breath or decreased O2).   pantoprazole  (PROTONIX ) 40 MG tablet Take 1 tablet (40 mg total) by mouth daily.   polyethylene glycol (MIRALAX  / GLYCOLAX ) 17 g packet Take 17 g by mouth daily as needed for moderate constipation.   polyvinyl alcohol  (LIQUIFILM TEARS) 1.4 % ophthalmic solution Place 1 drop into both eyes as needed for dry eyes.   predniSONE  (DELTASONE ) 10 MG tablet Take 4 tablets (40 mg total) by mouth daily for 3 days, THEN 3 tablets (30 mg total) daily for 3 days, THEN 2 tablets (20 mg total) daily for 3 days, THEN 1 tablet (10 mg total) daily for 3 days.   rosuvastatin  (CRESTOR ) 10 MG tablet Take 1 tablet (10 mg total) by mouth daily.   sodium phosphate (FLEET) ENEM Place 1 enema rectally daily as needed for severe constipation (if no BM results in 12 hours from dulcolax suppository).   sorbitol 70 % solution Take 30 mLs by mouth daily as needed (constipation.).    Immunization History  Administered Date(s) Administered   Fluad Quad(high Dose 65+) 09/02/2023   Hepatitis B, ADULT 07/05/2014, 08/04/2014, 09/01/2014, 01/03/2015, 12/28/2016, 01/28/2017, 03/01/2017, 07/01/2017   Hepb-cpg 01/04/2021, 02/01/2021, 03/08/2021, 05/03/2021   Influenza, High Dose Seasonal PF 08/26/2019, 08/29/2020, 09/13/2021   Influenza, Quadrivalent, Recombinant, Inj, Pf 09/21/2022   Influenza-Unspecified 09/30/2018, 08/30/2019   Moderna Sars-Covid-2 Vaccination 01/13/2020, 02/10/2020, 10/19/2020    Pneumococcal Conjugate-13 09/08/2018   Pneumococcal Polysaccharide-23 01/04/2017, 09/13/2019        Objective:     BP 128/78 (BP Location: Right Wrist, Cuff Size: Large)   Pulse 77   Temp (!) 97.1 F (36.2 C)   Ht 5' 5 (1.651 m)   Wt 235 lb (106.6 kg) Comment: per patient, in a wheelchair today.  SpO2 97%   BMI 39.11 kg/m   SpO2: 97 % O2 Device: Nasal cannula O2 Flow Rate (L/min): 1 L/min O2 Type: Continuous O2  GENERAL: Very debilitated appearing elderly woman, presents in wheelchair, no conversational dyspnea. HEAD: Normocephalic, atraumatic.  EYES: Pupils equal, round, reactive to light.  No scleral icterus.  MOUTH: Poor dentition, edentulous upper, oral mucosa moist. NECK: Supple. No thyromegaly. Trachea midline.+JVD.  No adenopathy. PULMONARY: Good air entry bilaterally.  No adventitious sounds. CARDIOVASCULAR: S1 and S2.  Irregular rate and rhythm (A-fib).  No rubs, murmurs or gallops heard. ABDOMEN: Obese, otherwise benign. MUSCULOSKELETAL: No joint deformity, no clubbing, +2 edema lower extremities.  NEUROLOGIC: No overt focal deficit, gait not tested.  Speech is fluent. SKIN: Intact,warm,dry. PSYCH: Mood and behavior normal.  I have reviewed available laboratory data and radiographic data.  Recent Results (from the past 2160 hours)  Lipase, blood     Status: None   Collection Time: 06/20/24  9:37 PM  Result Value Ref Range   Lipase 50 11 - 51 U/L    Comment: Performed at [facility], [address] [place], [place] [address]  Comprehensive metabolic panel     Status: Abnormal   Collection Time: 06/20/24  9:37 PM  Result Value Ref Range   Sodium 136 135 - 145 mmol/L   Potassium 4.7 3.5 - 5.1 mmol/L   Chloride 94 (L) 98 -  111 mmol/L   CO2 28 22 - 32 mmol/L   Glucose, Bld 136 (H) 70 - 99 mg/dL    Comment: Glucose reference range applies only to samples taken after fasting for at least 8 hours.   BUN 46 (H) 8 - 23 mg/dL   Creatinine, Ser 4.01  (H) 0.44 - 1.00 mg/dL   Calcium  8.1 (L) 8.9 - 10.3 mg/dL   Total Protein 7.7 6.5 - 8.1 g/dL   Albumin  3.5 3.5 - 5.0 g/dL   AST 27 15 - 41 U/L   ALT 17 0 - 44 U/L   Alkaline Phosphatase 143 (H) 38 - 126 U/L   Total Bilirubin 0.8 0.0 - 1.2 mg/dL   GFR, Estimated 7 (L) >60 mL/min    Comment: (NOTE) Calculated using the CKD-EPI Creatinine Equation (2021)    Anion gap 14 5 - 15    Comment: Performed at [facility], [address] [place], [place] [address]  CBC     Status: Abnormal   Collection Time: 06/20/24  9:37 PM  Result Value Ref Range   WBC 11.0 (H) 4.0 - 10.5 K/uL   RBC 3.75 (L) 3.87 - 5.11 MIL/uL   Hemoglobin 12.0 12.0 - 15.0 g/dL   HCT 60.2 63.9 - 53.9 %   MCV 105.9 (H) 80.0 - 100.0 fL   MCH 32.0 26.0 - 34.0 pg   MCHC 30.2 30.0 - 36.0 g/dL   RDW 84.8 88.4 - 84.4 %   Platelets 188 150 - 400 K/uL   nRBC 0.0 0.0 - 0.2 %    Comment: Performed at [facility], [address] [place], [place] [address]  CBG monitoring, ED     Status: Abnormal   Collection Time: 06/21/24  3:42 AM  Result Value Ref Range   Glucose-Capillary 152 (H) 70 - 99 mg/dL    Comment: Glucose reference range applies only to samples taken after fasting for at least 8 hours.  Hemoglobin A1c     Status: Abnormal   Collection Time: 06/21/24  8:00 AM  Result Value Ref Range   Hgb A1c MFr Bld 6.0 (H) 4.8 - 5.6 %    Comment: (NOTE) Diagnosis of Diabetes The following HbA1c ranges recommended by the American Diabetes Association (ADA) may be used as an aid in the diagnosis of diabetes mellitus.  Hemoglobin             Suggested A1C NGSP%              Diagnosis  <5.7                   Non Diabetic  5.7-6.4                Pre-Diabetic  >6.4                   Diabetic  <7.0                   Glycemic control for                       adults with diabetes.     Mean Plasma Glucose 125.5 mg/dL    Comment: Performed at [facility], [address], [place], [place] [address]   Glucose, capillary     Status: Abnormal   Collection Time: 06/21/24  8:14 AM  Result Value Ref Range   Glucose-Capillary 195 (H) 70 - 99 mg/dL  Comment: Glucose reference range applies only to samples taken after fasting for at least 8 hours.  Glucose, capillary     Status: Abnormal   Collection Time: 06/21/24 11:17 AM  Result Value Ref Range   Glucose-Capillary 303 (H) 70 - 99 mg/dL    Comment: Glucose reference range applies only to samples taken after fasting for at least 8 hours.  Glucose, capillary     Status: Abnormal   Collection Time: 06/21/24  4:30 PM  Result Value Ref Range   Glucose-Capillary 230 (H) 70 - 99 mg/dL    Comment: Glucose reference range applies only to samples taken after fasting for at least 8 hours.  Glucose, capillary     Status: Abnormal   Collection Time: 06/21/24  8:53 PM  Result Value Ref Range   Glucose-Capillary 190 (H) 70 - 99 mg/dL    Comment: Glucose reference range applies only to samples taken after fasting for at least 8 hours.   Comment 1 Notify RN   Basic metabolic panel     Status: Abnormal   Collection Time: 06/22/24  5:06 AM  Result Value Ref Range   Sodium 131 (L) 135 - 145 mmol/L   Potassium 7.1 (HH) 3.5 - 5.1 mmol/L    Comment: CRITICAL RESULT CALLED TO, READ BACK BY AND VERIFIED WITH LARRAINE HASTEN RN [phone] 06/22/24 HNM    Chloride 91 (L) 98 - 111 mmol/L   CO2 23 22 - 32 mmol/L   Glucose, Bld 188 (H) 70 - 99 mg/dL    Comment: Glucose reference range applies only to samples taken after fasting for at least 8 hours.   BUN 68 (H) 8 - 23 mg/dL   Creatinine, Ser 1.64 (H) 0.44 - 1.00 mg/dL   Calcium  7.8 (L) 8.9 - 10.3 mg/dL   GFR, Estimated 4 (L) >60 mL/min    Comment: (NOTE) Calculated using the CKD-EPI Creatinine Equation (2021)    Anion gap 17 (H) 5 - 15    Comment: Performed at [facility], [address] [place], [place] [address]  CBC     Status: Abnormal   Collection Time: 06/22/24  5:06 AM  Result Value Ref  Range   WBC 8.4 4.0 - 10.5 K/uL   RBC 3.55 (L) 3.87 - 5.11 MIL/uL   Hemoglobin 11.4 (L) 12.0 - 15.0 g/dL   HCT 63.0 63.9 - 53.9 %   MCV 103.9 (H) 80.0 - 100.0 fL   MCH 32.1 26.0 - 34.0 pg   MCHC 30.9 30.0 - 36.0 g/dL   RDW 84.6 88.4 - 84.4 %   Platelets 163 150 - 400 K/uL   nRBC 0.0 0.0 - 0.2 %    Comment: Performed at [facility], [address] [place], [place] [address]  Phosphorus     Status: None   Collection Time: 06/22/24  5:06 AM  Result Value Ref Range   Phosphorus 3.4 2.5 - 4.6 mg/dL    Comment: Performed at [facility], [address] [place], [place] [address]  Glucose, capillary     Status: Abnormal   Collection Time: 06/22/24  7:28 AM  Result Value Ref Range   Glucose-Capillary 171 (H) 70 - 99 mg/dL    Comment: Glucose reference range applies only to samples taken after fasting for at least 8 hours.  Glucose, capillary     Status: Abnormal   Collection Time: 06/22/24  4:49 PM  Result Value Ref Range   Glucose-Capillary 256 (H) 70 - 99  mg/dL    Comment: Glucose reference range applies only to samples taken after fasting for at least 8 hours.  Glucose, capillary     Status: Abnormal   Collection Time: 06/22/24  9:46 PM  Result Value Ref Range   Glucose-Capillary 170 (H) 70 - 99 mg/dL    Comment: Glucose reference range applies only to samples taken after fasting for at least 8 hours.  Basic metabolic panel     Status: Abnormal   Collection Time: 06/22/24 11:59 PM  Result Value Ref Range   Sodium 132 (L) 135 - 145 mmol/L   Potassium 4.8 3.5 - 5.1 mmol/L   Chloride 86 (L) 98 - 111 mmol/L   CO2 28 22 - 32 mmol/L   Glucose, Bld 207 (H) 70 - 99 mg/dL    Comment: Glucose reference range applies only to samples taken after fasting for at least 8 hours.   BUN 45 (H) 8 - 23 mg/dL   Creatinine, Ser 4.45 (H) 0.44 - 1.00 mg/dL   Calcium  8.1 (L) 8.9 - 10.3 mg/dL   GFR, Estimated 7 (L) >60 mL/min    Comment: (NOTE) Calculated using the CKD-EPI  Creatinine Equation (2021)    Anion gap 18 (H) 5 - 15    Comment: Performed at [facility], [address] [place], [place] [address]  CBC     Status: Abnormal   Collection Time: 06/23/24  4:30 AM  Result Value Ref Range   WBC 11.8 (H) 4.0 - 10.5 K/uL   RBC 3.50 (L) 3.87 - 5.11 MIL/uL   Hemoglobin 11.1 (L) 12.0 - 15.0 g/dL   HCT 63.5 63.9 - 53.9 %   MCV 104.0 (H) 80.0 - 100.0 fL   MCH 31.7 26.0 - 34.0 pg   MCHC 30.5 30.0 - 36.0 g/dL   RDW 84.5 88.4 - 84.4 %   Platelets 169 150 - 400 K/uL   nRBC 0.0 0.0 - 0.2 %    Comment: Performed at [facility], [address], [place], [place] [address]  Basic metabolic panel     Status: Abnormal   Collection Time: 06/23/24  4:30 AM  Result Value Ref Range   Sodium 130 (L) 135 - 145 mmol/L   Potassium 4.5 3.5 - 5.1 mmol/L   Chloride 89 (L) 98 - 111 mmol/L   CO2 28 22 - 32 mmol/L   Glucose, Bld 209 (H) 70 - 99 mg/dL    Comment: Glucose reference range applies only to samples taken after fasting for at least 8 hours.   BUN 50 (H) 8 - 23 mg/dL   Creatinine, Ser 4.45 (H) 0.44 - 1.00 mg/dL   Calcium  7.9 (L) 8.9 - 10.3 mg/dL   GFR, Estimated 7 (L) >60 mL/min    Comment: (NOTE) Calculated using the CKD-EPI Creatinine Equation (2021)    Anion gap 13 5 - 15    Comment: Performed at [facility], [address] [place], [place] [address]  Glucose, capillary     Status: Abnormal   Collection Time: 06/23/24  8:02 AM  Result Value Ref Range   Glucose-Capillary 184 (H) 70 - 99 mg/dL    Comment: Glucose reference range applies only to samples taken after fasting for at least 8 hours.  Glucose, capillary     Status: Abnormal   Collection Time: 06/23/24 11:32 AM  Result Value Ref Range   Glucose-Capillary 210 (H) 70 - 99 mg/dL    Comment: Glucose reference range applies only to  samples taken after fasting for at least 8 hours.  Glucose, capillary     Status: Abnormal   Collection Time: 06/23/24  4:32 PM  Result  Value Ref Range   Glucose-Capillary 170 (H) 70 - 99 mg/dL    Comment: Glucose reference range applies only to samples taken after fasting for at least 8 hours.  Glucose, capillary     Status: Abnormal   Collection Time: 06/23/24  8:57 PM  Result Value Ref Range   Glucose-Capillary 205 (H) 70 - 99 mg/dL    Comment: Glucose reference range applies only to samples taken after fasting for at least 8 hours.  CBC     Status: Abnormal   Collection Time: 06/24/24  5:28 AM  Result Value Ref Range   WBC 9.9 4.0 - 10.5 K/uL   RBC 3.59 (L) 3.87 - 5.11 MIL/uL   Hemoglobin 11.6 (L) 12.0 - 15.0 g/dL   HCT 62.6 63.9 - 53.9 %   MCV 103.9 (H) 80.0 - 100.0 fL   MCH 32.3 26.0 - 34.0 pg   MCHC 31.1 30.0 - 36.0 g/dL   RDW 84.5 88.4 - 84.4 %   Platelets 164 150 - 400 K/uL   nRBC 0.0 0.0 - 0.2 %    Comment: Performed at [facility], [address] [place], [place] [address]  Basic metabolic panel with GFR     Status: Abnormal   Collection Time: 06/24/24  5:28 AM  Result Value Ref Range   Sodium 130 (L) 135 - 145 mmol/L   Potassium 5.0 3.5 - 5.1 mmol/L   Chloride 86 (L) 98 - 111 mmol/L   CO2 27 22 - 32 mmol/L   Glucose, Bld 190 (H) 70 - 99 mg/dL    Comment: Glucose reference range applies only to samples taken after fasting for at least 8 hours.   BUN 79 (H) 8 - 23 mg/dL   Creatinine, Ser 2.60 (H) 0.44 - 1.00 mg/dL   Calcium  7.6 (L) 8.9 - 10.3 mg/dL   GFR, Estimated 5 (L) >60 mL/min    Comment: (NOTE) Calculated using the CKD-EPI Creatinine Equation (2021)    Anion gap 17 (H) 5 - 15    Comment: Performed at [facility], [address] [place], [place] [address]  Magnesium      Status: None   Collection Time: 06/24/24  5:28 AM  Result Value Ref Range   Magnesium  1.8 1.7 - 2.4 mg/dL    Comment: Performed at [facility], [address], [place], [place] [address]  Phosphorus     Status: Abnormal   Collection Time: 06/24/24  5:28 AM  Result Value Ref Range    Phosphorus 5.3 (H) 2.5 - 4.6 mg/dL    Comment: Performed at [facility], [address] [place], [place] [address]  Glucose, capillary     Status: Abnormal   Collection Time: 06/24/24 12:48 PM  Result Value Ref Range   Glucose-Capillary 146 (H) 70 - 99 mg/dL    Comment: Glucose reference range applies only to samples taken after fasting for at least 8 hours.  Glucose, capillary     Status: Abnormal   Collection Time: 06/24/24  4:59 PM  Result Value Ref Range   Glucose-Capillary 219 (H) 70 - 99 mg/dL    Comment: Glucose reference range applies only to samples taken after fasting for at least 8 hours.  Glucose, capillary     Status: Abnormal   Collection Time: 06/24/24  9:03 PM  Result Value Ref Range   Glucose-Capillary 269 (H) 70 - 99 mg/dL    Comment: Glucose reference range applies only to samples taken after fasting for at least 8 hours.   Comment 1 Notify RN   CBC     Status: Abnormal   Collection Time: 06/25/24  3:53 AM  Result Value Ref Range   WBC 9.4 4.0 - 10.5 K/uL   RBC 3.71 (L) 3.87 - 5.11 MIL/uL   Hemoglobin 11.8 (L) 12.0 - 15.0 g/dL   HCT 61.3 63.9 - 53.9 %   MCV 104.0 (H) 80.0 - 100.0 fL   MCH 31.8 26.0 - 34.0 pg   MCHC 30.6 30.0 - 36.0 g/dL   RDW 84.5 88.4 - 84.4 %   Platelets 170 150 - 400 K/uL   nRBC 0.0 0.0 - 0.2 %    Comment: Performed at [facility], [address] [place], [place] [address]  Basic metabolic panel with GFR     Status: Abnormal   Collection Time: 06/25/24  3:53 AM  Result Value Ref Range   Sodium 133 (L) 135 - 145 mmol/L   Potassium 4.1 3.5 - 5.1 mmol/L   Chloride 90 (L) 98 - 111 mmol/L   CO2 30 22 - 32 mmol/L   Glucose, Bld 220 (H) 70 - 99 mg/dL    Comment: Glucose reference range applies only to samples taken after fasting for at least 8 hours.   BUN 55 (H) 8 - 23 mg/dL   Creatinine, Ser 4.60 (H) 0.44 - 1.00 mg/dL   Calcium  7.7 (L) 8.9 - 10.3 mg/dL   GFR, Estimated 8 (L) >60 mL/min    Comment:  (NOTE) Calculated using the CKD-EPI Creatinine Equation (2021)    Anion gap 13 5 - 15    Comment: Performed at [facility], [address] [place], [place] [address]  Glucose, capillary     Status: Abnormal   Collection Time: 06/25/24  7:37 AM  Result Value Ref Range   Glucose-Capillary 229 (H) 70 - 99 mg/dL    Comment: Glucose reference range applies only to samples taken after fasting for at least 8 hours.  Glucose, capillary     Status: Abnormal   Collection Time: 06/25/24 11:32 AM  Result Value Ref Range   Glucose-Capillary 233 (H) 70 - 99 mg/dL    Comment: Glucose reference range applies only to samples taken after fasting for at least 8 hours.    Assessment & Plan:     ICD-10-CM   1. Severe pulmonary hypertension (HCC)  I27.20 Pulmonary function test    ECHOCARDIOGRAM COMPLETE    AMB REFERRAL FOR DME    2. Chronic respiratory failure with hypoxia (HCC)  J96.11     3. 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 Atrial fibrillation, chronic (HCC)  I48.20     6. ESRD (end stage renal disease) (HCC)  N18.6     7. Type 2 diabetes mellitus with end-stage renal disease (HCC)  E11.22    N18.6     8. Coronary artery disease involving native coronary artery of native heart without angina pectoris  I25.10       Orders Placed This Encounter  Procedures   AMB REFERRAL FOR DME    Referral Priority:   Routine    Referral Type:   Durable  Medical Equipment Purchase    Number of Visits Requested:   1   ECHOCARDIOGRAM COMPLETE    Standing Status:   Future    Expected Date:   07/31/2024    Expiration Date:   06/30/2025    Where should this test be performed:   [facility]    Perflutren  DEFINITY  (image enhancing agent) should be administered unless hypersensitivity or allergy exist:   Administer Perflutren     Reason for exam-Echo:   Pulmonary  hypertension I27.2   Pulmonary function test    Standing Status:   Future    Expiration Date:   06/30/2025    Scheduling Instructions:     Flow volume loop    Where should this test be performed?:   Outpatient Pulmonary    What type of PFT is being ordered?:   Simple Spirometry Pre/Post Bronchodilator   Discussion:    Cough with rib fractures due to recent bronchitis now improving Cough is persistent and severe, causing rib fractures confirmed by imaging. Steroids have reduced the cough but caused hyperglycemia and constipation.  Antibiotics have also helped.  Nebulizer treatments with albuterol  provide some relief. Differential includes possible allergic component. - Continue Duoneb but make it scheduled four times daily. - Obtain spirometry pre and post bronchodilator with flow volume loop to assess for potential tracheobronchomalacia. - Complete steroid/antibiotic therapy  Chronic respiratory failure with hypoxemia Managed with supplemental oxygen , previously at two liters per minute, now reduced to one liter per minute. Oxygen  therapy is essential due to pulmonary hypertension and right-sided heart failure. 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 Right heart enlargement noted, will need an in lab sleep study once rib fractures heal to exclude potential sleep apnea. - Repeat 2D echocardiogram to reevaluate pulmonary hypertension and right-sided heart failure. - Consider in-lab sleep study to assess for sleep apnea once rib fractures heal.  End stage renal disease on dialysis Managed with dialysis three times a week. No missed sessions reported. Coordination with nephrology is  ongoing. - Continue dialysis as per renal. - Right-sided failure and pulmonary hypertension will make fluid removal challenging.  Steroid-induced hyperglycemia and constipation Current steroid therapy has led to hyperglycemia and constipation. - Complete prednisone  taper.  - If persistent issues with cough after prednisone  taper can start Pulmicort 0.25 mg twice daily via neb    Will see the patient in follow-up in 2 months time she is to contact us  prior to that time should any new difficulties arise.   Advised if symptoms do not improve or worsen, to please contact office for sooner follow up or seek emergency care.    I spent 65 minutes of dedicated to the care of this patient on the date of this encounter to include pre-visit review of records, face-to-face time with the patient discussing conditions above, post visit ordering of testing, clinical documentation with the electronic health record, making appropriate referrals as documented, and communicating necessary findings to members of the patients care team.   C. Leita Sanders, MD Advanced Bronchoscopy PCCM [facility]    *This note was dictated using voice recognition software/Dragon.  Despite best efforts to proofread, errors can occur which can change the meaning. Any transcriptional errors that result from this process are  unintentional and may not be fully corrected at the time of dictation.

## 2024-06-30 NOTE — Patient Instructions (Addendum)
 VISIT SUMMARY:  During your visit today, we discussed your persistent cough and rib pain, which have been causing significant discomfort. We reviewed your current treatments and made some adjustments to better manage your symptoms and overall health. Your ongoing conditions, including pulmonary hypertension, chronic respiratory failure, and end-stage renal disease, were also addressed.  YOUR PLAN:  -COUGH WITH RIB FRACTURES: Your severe cough has caused rib fractures, and while steroids have helped reduce the cough, they have also caused high blood sugar and constipation. We will start you on Duoneb four times daily to help with your cough and perform a spirometry test to check your lung function.  -CHRONIC RESPIRATORY FAILURE WITH HYPOXEMIA: This condition means your lungs are not getting enough oxygen  into your blood. You will continue using supplemental oxygen  at one liter per minute to help manage this.  This is likely related to your pulmonary hypertension.  -PULMONARY HYPERTENSION WITH RIGHT-SIDED HEART FAILURE: This is high blood pressure in the lungs that affects the right side of your heart. We will repeat an echocardiogram to check on your heart and consider a sleep study to see if sleep apnea is contributing to your condition.  -END STAGE RENAL DISEASE ON DIALYSIS: This is the final stage of chronic kidney disease, and you are managing it with dialysis three times a week. Continue with your current dialysis schedule.  -STEROID-INDUCED HYPERGLYCEMIA AND CONSTIPATION: The steroids you are taking have caused high blood sugar and constipation.  This hopefully should resolve once you are off of steroids.  INSTRUCTIONS:  Please follow up with the spirometry test and the echocardiogram as discussed. Continue your dialysis sessions as scheduled and use your oxygen  therapy at one liter per minute. We will also consider a sleep study to evaluate for sleep apnea to be done in the future if you  continue to have issues. If you have any questions or concerns, please contact our office.

## 2024-07-28 ENCOUNTER — Ambulatory Visit

## 2024-09-08 ENCOUNTER — Other Ambulatory Visit

## 2024-09-17 ENCOUNTER — Ambulatory Visit

## 2024-09-17 ENCOUNTER — Ambulatory Visit: Admitting: Pulmonary Disease

## 2024-09-17 ENCOUNTER — Encounter: Payer: Self-pay | Admitting: Pulmonary Disease

## 2024-09-17 VITALS — BP 100/60 | HR 73 | Temp 97.9°F | Ht 65.0 in | Wt 221.0 lb

## 2024-09-17 DIAGNOSIS — J984 Other disorders of lung: Secondary | ICD-10-CM | POA: Diagnosis not present

## 2024-09-17 DIAGNOSIS — Z992 Dependence on renal dialysis: Secondary | ICD-10-CM

## 2024-09-17 DIAGNOSIS — R059 Cough, unspecified: Secondary | ICD-10-CM

## 2024-09-17 DIAGNOSIS — R0602 Shortness of breath: Secondary | ICD-10-CM

## 2024-09-17 DIAGNOSIS — N186 End stage renal disease: Secondary | ICD-10-CM

## 2024-09-17 DIAGNOSIS — Z87891 Personal history of nicotine dependence: Secondary | ICD-10-CM

## 2024-09-17 DIAGNOSIS — I272 Pulmonary hypertension, unspecified: Secondary | ICD-10-CM

## 2024-09-17 DIAGNOSIS — J9611 Chronic respiratory failure with hypoxia: Secondary | ICD-10-CM

## 2024-09-17 DIAGNOSIS — R053 Chronic cough: Secondary | ICD-10-CM

## 2024-09-17 LAB — PULMONARY FUNCTION TEST
FEF 25-75 Post: 1.75 L/s
FEF 25-75 Pre: 1.33 L/s
FEF2575-%Change-Post: 31 %
FEF2575-%Pred-Post: 119 %
FEF2575-%Pred-Pre: 90 %
FEV1-%Change-Post: 2 %
FEV1-%Pred-Post: 50 %
FEV1-%Pred-Pre: 49 %
FEV1-Post: 1.03 L
FEV1-Pre: 1.01 L
FEV1FVC-%Change-Post: -6 %
FEV1FVC-%Pred-Pre: 118 %
FEV6-%Change-Post: 9 %
FEV6-%Pred-Post: 48 %
FEV6-%Pred-Pre: 44 %
FEV6-Post: 1.25 L
FEV6-Pre: 1.15 L
FEV6FVC-%Pred-Post: 105 %
FEV6FVC-%Pred-Pre: 105 %
FVC-%Change-Post: 9 %
FVC-%Pred-Post: 45 %
FVC-%Pred-Pre: 42 %
FVC-Post: 1.25 L
FVC-Pre: 1.15 L
Post FEV1/FVC ratio: 82 %
Post FEV6/FVC ratio: 100 %
Pre FEV1/FVC ratio: 88 %
Pre FEV6/FVC Ratio: 100 %

## 2024-09-17 NOTE — Progress Notes (Unsigned)
 Subjective:    Patient ID: Jacqueline [name], female    DOB: 11-23-1946, 78 y.o.   MRN: [id number]  Patient Care Team: Austin Candyce Stall, MD as PCP - General (Internal Medicine) Darron Deatrice LABOR, MD as PCP - Cardiology (Cardiology) Fernande Elspeth BROCKS, MD as PCP - Electrophysiology (Cardiology) Arnell Rockney PARAS, [facility] (Inactive) (Pharmacist) Tobie Gordy POUR, MD as Consulting Physician (Nephrology)  Chief Complaint  Patient presents with   Medical Management of Chronic Issues    Cough and shortness of breath.     BACKGROUND/INTERVAL:  HPI     DATA 06/08/2023 echocardiogram: LVEF 60-65 LV without regional wall motion abnormalities, indeterminate LV diastolic parameters.  Right ventricular systolic function is moderately reduced, right ventricular size is moderately enlarged.  There is severely elevated pulmonary artery systolic pressure with the estimated right ventricular systolic pressure at 79 mmHg.  Left atrial size mildly dilated.  Mitral valve is normal in structure.  Mild mitral valve regurgitation.  No evidence of mitral stenosis.  Tricuspid valve regurgitation is moderate.  Aortic valve sclerosis without stenosis.  Inferior vena cava is dilated atrial pressure estimated at 15 mmHg. 06/11/2023 right/left heart cath: Mild to nonobstructive coronary artery disease, widely patent proximal RCA stent.  Mild to moderately elevated left heart filling pressures.  Severely elevated right heart and pulmonary artery pressures.  Pulmonary artery pressure does not exceed systemic pressure.  Mildly reduced cardiac output/index.  PA 73/30 (44) mmHg. PCWP (mean) 30 mmHg with prominent v waves.  LV end-diastolic pressure is mildly elevated.  LVEDP 22 mmHg, no aortic valve stenosis. 06/21/2024 CT angio chest: No PE.  Mild mosaic attenuation/groundglass opacity in the lungs bilaterally consistent with volume overload.  Mild atelectasis of the lingula and bilateral lower lobes.  Narrowing of the trachea and  bilateral mainstem bronchi which may indicate tracheobronchomalacia (TBM)  Review of Systems A 10 point review of systems was performed and it is as noted above otherwise negative.   Patient Active Problem List   Diagnosis Date Noted   Acute bronchitis 06/22/2024   Obesity (BMI 30-39.9) 06/22/2024   Atrial fibrillation, chronic (HCC) 06/21/2024   Goals of care, counseling/discussion 06/12/2023   Severe pulmonary hypertension (HCC) 06/10/2023   Morbid obesity (HCC) 06/08/2023   Hyperkalemia 09/30/2022   Constipation 09/30/2022   AMS (altered mental status) 09/28/2022   Hypotension    Permanent atrial fibrillation (HCC)    Pressure injury of skin 05/22/2022   General weakness 05/22/2022   Weakness 05/21/2022   Anaphylactic shock, unspecified, initial encounter 06/29/2020   Sepsis (HCC) 06/22/2019   Pain due to onychomycosis of toenails of both feet 06/11/2019   Callus 06/11/2019   Other disorders of phosphorus metabolism 05/20/2019   Hyperlipidemia, unspecified 03/22/2019   Otalgia of both ears 03/22/2019   Right arm pain 03/22/2019   Bradycardia 10/01/2017   Dyslipidemia 10/01/2017   Dependence on renal dialysis 09/01/2017   Chest pain, unspecified 12/06/2016   Non-ST elevation (NSTEMI) myocardial infarction (HCC) 12/06/2016   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   History of NSTEMI    CAD (coronary artery disease) 12/03/2016   Hypokalemia 05/03/2016   Pruritus, unspecified 03/27/2015   Encounter for removal of sutures 11/30/2014   Pain in lower limb 08/26/2014   Encounter for immunization 06/10/2014   Unspecified protein-calorie malnutrition 02/28/2014   Allergy, unspecified, initial encounter 02/15/2014   HCAP (healthcare-associated pneumonia) 02/13/2014   Absence of both cervix and uterus, acquired 02/01/2014   Coagulation defect, unspecified 02/01/2014   Dyspnea, unspecified 02/01/2014  Family history of diabetes mellitus 02/01/2014   Family history of ischemic  heart disease and other diseases of the circulatory system 02/01/2014   Fever, unspecified 02/01/2014   Headache, unspecified 02/01/2014   History of allergy to sulfonamides 02/01/2014   Iron  deficiency anemia, unspecified 02/01/2014   Long term (current) use of insulin  (HCC) 02/01/2014   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   OSA (obstructive sleep apnea) 01/21/2014   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HCC) 01/21/2014   Acute encephalopathy 01/19/2014   Congestive heart failure (HCC) 01/15/2014   ESRD (end stage renal disease) (HCC) 01/15/2014   Hypertension 01/15/2014   Type 2 diabetes mellitus with end-stage renal disease (HCC) 01/15/2014   Anemia 01/15/2014   Onychomycosis 03/26/2013   Pain in joint, ankle and foot 03/26/2013    Social History   Tobacco Use   Smoking status: Former    Current packs/day: 0.00    Average packs/day: 1 pack/day for 20.0 years (20.0 ttl pk-yrs)    Types: Cigarettes    Start date: 44    Quit date: 1985    Years since quitting: 40.8   Smokeless tobacco: Never  Substance Use Topics   Alcohol  use: No    Alcohol /week: 0.0 standard drinks of alcohol     Allergies  Allergen Reactions   Sulfa Antibiotics Other (See Comments)   Sulfa Drugs Cross Reactors Other (See Comments)    Doesn't remember     No outpatient medications have been marked as taking for the 09/17/24 encounter (Office Visit) with Tamea Dedra CROME, MD.    Immunization History  Administered Date(s) Administered   Fluad Quad(high Dose 65+) 09/02/2023   Hepatitis B, ADULT 07/05/2014, 08/04/2014, 09/01/2014, 01/03/2015, 12/28/2016, 01/28/2017, 03/01/2017, 07/01/2017   Hepb-cpg 01/04/2021, 02/01/2021, 03/08/2021, 05/03/2021   INFLUENZA, HIGH DOSE SEASONAL PF 08/26/2019, 08/29/2020, 09/13/2021   Influenza, Quadrivalent, Recombinant, Inj, Pf 09/21/2022   Influenza-Unspecified 09/30/2018, 08/30/2019   Moderna Sars-Covid-2 Vaccination 01/13/2020, 02/10/2020,  10/19/2020   Pneumococcal Conjugate-13 09/08/2018   Pneumococcal Polysaccharide-23 01/04/2017, 09/13/2019        Objective:     BP 100/60   Pulse 73   Temp 97.9 F (36.6 C) (Temporal)   Ht 5' 5 (1.651 m)   Wt 221 lb (100.2 kg)   SpO2 95%   BMI 36.78 kg/m   SpO2: 95 %  GENERAL: HEAD: Normocephalic, atraumatic.  EYES: Pupils equal, round, reactive to light.  No scleral icterus.  MOUTH:  NECK: Supple. No thyromegaly. Trachea midline. No JVD.  No adenopathy. PULMONARY: Good air entry bilaterally.  No adventitious sounds. CARDIOVASCULAR: S1 and S2. Regular rate and rhythm.  ABDOMEN: MUSCULOSKELETAL: No joint deformity, no clubbing, no edema.  NEUROLOGIC:  SKIN: Intact,warm,dry. PSYCH:        Assessment & Plan:   No diagnosis found.  No orders of the defined types were placed in this encounter.   No orders of the defined types were placed in this encounter.     Advised if symptoms do not improve or worsen, to please contact office for sooner follow up or seek emergency care.    I spent xxx minutes of dedicated to the care of this patient on the date of this encounter to include pre-visit review of records, face-to-face time with the patient discussing conditions above, post visit ordering of testing, clinical documentation with the electronic health record, making appropriate referrals as documented, and communicating necessary findings to members of the patients care team.     C. Leita Tamea, MD Advanced Bronchoscopy PCCM [facility] [facility]    *This note was generated using voice recognition software/Dragon and/or AI transcription program.  Despite best efforts to proofread, errors can occur which can change the meaning. Any transcriptional errors that result from this process are unintentional and may not be fully corrected at the time of dictation.

## 2024-09-17 NOTE — Patient Instructions (Signed)
 VISIT SUMMARY:  Today, we discussed the significant improvement in your cough and reviewed your current treatment plan. We also talked about your mobility and the importance of physical therapy, as well as your ongoing dialysis treatment.  YOUR PLAN:  -COUGH: Your cough has improved significantly with the use of nebulizers. This condition is likely related to your mobility issues and elevated pressure on the artery from your heart to your lungs. It is important to ensure that you receive your nebulizer treatments four times a day to help manage your cough.  -IMPAIRED MOBILITY: Your limited mobility is contributing to shallow breathing. While you are doing self-directed exercises, additional physical therapy could be beneficial. It is recommended to discuss with your primary doctor at the facility about getting more physical therapy to improve your mobility.  -END STAGE RENAL DISEASE ON DIALYSIS: End stage renal disease means your kidneys are no longer functioning well enough to meet your body's needs, and you are receiving dialysis to help manage this condition. The dialysis is also helping to improve your cough by reducing pressure on the artery from your heart to your lungs.  INSTRUCTIONS:  Please ensure that you receive your nebulizer treatments four times a day. Discuss with your primary doctor at the facility about the possibility of getting additional physical therapy to help with your mobility.

## 2024-09-17 NOTE — Patient Instructions (Signed)
 Spirometry Pre/Post completed today.

## 2024-09-17 NOTE — Progress Notes (Signed)
 Spirometry Pre/Post completed today.

## 2024-09-28 ENCOUNTER — Encounter (HOSPITAL_COMMUNITY): Payer: Self-pay

## 2024-09-29 ENCOUNTER — Other Ambulatory Visit: Payer: Self-pay

## 2024-09-29 ENCOUNTER — Encounter (HOSPITAL_COMMUNITY)
Admission: RE | Disposition: A | Discharge status: Home / Self Care | Payer: Self-pay | Source: Home / Self Care | Attending: Vascular Surgery

## 2024-09-29 ENCOUNTER — Encounter (HOSPITAL_COMMUNITY): Payer: Self-pay | Admitting: Vascular Surgery

## 2024-09-29 ENCOUNTER — Ambulatory Visit (HOSPITAL_COMMUNITY)
Admission: RE | Admit: 2024-09-29 | Discharge: 2024-09-29 | Disposition: A | Discharge status: Home / Self Care | Attending: Vascular Surgery | Admitting: Vascular Surgery

## 2024-09-29 DIAGNOSIS — T82858A Stenosis of vascular prosthetic devices, implants and grafts, initial encounter: Secondary | ICD-10-CM | POA: Diagnosis not present

## 2024-09-29 DIAGNOSIS — I871 Compression of vein: Secondary | ICD-10-CM | POA: Diagnosis not present

## 2024-09-29 DIAGNOSIS — Y832 Surgical operation with anastomosis, bypass or graft as the cause of abnormal reaction of the patient, or of later complication, without mention of misadventure at the time of the procedure: Secondary | ICD-10-CM | POA: Diagnosis not present

## 2024-09-29 DIAGNOSIS — E1122 Type 2 diabetes mellitus with diabetic chronic kidney disease: Secondary | ICD-10-CM | POA: Insufficient documentation

## 2024-09-29 DIAGNOSIS — I4819 Other persistent atrial fibrillation: Secondary | ICD-10-CM | POA: Diagnosis not present

## 2024-09-29 DIAGNOSIS — N186 End stage renal disease: Secondary | ICD-10-CM | POA: Diagnosis not present

## 2024-09-29 DIAGNOSIS — Z87891 Personal history of nicotine dependence: Secondary | ICD-10-CM | POA: Insufficient documentation

## 2024-09-29 DIAGNOSIS — Z992 Dependence on renal dialysis: Secondary | ICD-10-CM | POA: Diagnosis not present

## 2024-09-29 DIAGNOSIS — I120 Hypertensive chronic kidney disease with stage 5 chronic kidney disease or end stage renal disease: Secondary | ICD-10-CM | POA: Insufficient documentation

## 2024-09-29 DIAGNOSIS — T82838A Hemorrhage of vascular prosthetic devices, implants and grafts, initial encounter: Secondary | ICD-10-CM | POA: Diagnosis present

## 2024-09-29 HISTORY — PX: A/V FISTULAGRAM: CATH118298

## 2024-09-29 HISTORY — PX: VENOUS ANGIOPLASTY: CATH118376

## 2024-09-29 LAB — GLUCOSE, CAPILLARY: Glucose-Capillary: 149 mg/dL — ABNORMAL HIGH (ref 70–99)

## 2024-09-29 MED ORDER — LIDOCAINE HCL (PF) 1 % IJ SOLN
INTRAMUSCULAR | Status: AC
  Filled 2024-09-29: qty 30

## 2024-09-29 MED ORDER — LIDOCAINE HCL (PF) 1 % IJ SOLN
INTRAMUSCULAR | Status: DC | PRN
  Administered 2024-09-29: 2 mL via INTRADERMAL

## 2024-09-29 MED ORDER — HEPARIN (PORCINE) IN NACL 1000-0.9 UT/500ML-% IV SOLN
INTRAVENOUS | Status: DC | PRN
  Administered 2024-09-29: 500 mL

## 2024-09-29 MED ORDER — IODIXANOL 320 MG/ML IV SOLN
INTRAVENOUS | Status: DC | PRN
  Administered 2024-09-29: 25 mL

## 2024-09-29 SURGICAL SUPPLY — 9 items
BALLOON MUSTANG 8.0X40 75 (BALLOONS) IMPLANT
DEVICE INFLATION ENCORE 26 (MISCELLANEOUS) IMPLANT
GUIDEWIRE ANGLED .035 180CM (WIRE) IMPLANT
KIT PV (KITS) ×1 IMPLANT
SET MICROPUNCTURE 5F STIFF (MISCELLANEOUS) IMPLANT
SHEATH PINNACLE R/O II 6F 4CM (SHEATH) IMPLANT
SHEATH PROBE COVER 6X72 (BAG) IMPLANT
TRAY PV CATH (CUSTOM PROCEDURE TRAY) ×1 IMPLANT
TUBING CIL FLEX 10 FLL-RA (TUBING) IMPLANT

## 2024-09-29 NOTE — Op Note (Signed)
    Patient name: Jacqueline [name] MRN: [id number] DOB: 06-Mar-1946 Sex: female  09/29/2024 Pre-operative Diagnosis: ESRD on HD Post-operative diagnosis:  Same Surgeon:  Norman GORMAN Serve, MD Procedure Performed:  Ultrasound access with dialysis route, fistulogram and central venogram, peripheral balloon angioplasty.  [id number]  Indications: [name] is an 78 year old female with ESRD presenting to the HD access center for fistulogram today.  She has been having issues with prolonged bleeding and low flows during sessions.  Her last HD session was yesterday.  We reviewed risks and benefits of fistulogram with intervention and she elected to proceed.  Findings:  Patent central venous system.  Severe 80% stenosis of the cephalic arch.  Widely patent left arm fistula and anastomosis.  It should be noted that there was a power failure with the C arm and the case had to be performed with regular fluoroscopy as digital subtraction was not available due to a low power mode   Procedure:  The patient was identified in the holding area and taken to the cath lab  The patient was then placed supine on the table and prepped and draped in the usual sterile fashion.  A time out was called.  Ultrasound was used to evaluate the left arm AV access. This was accessed under u/s guidance. An 018 wire was advanced without resistance, a micropuncture sheath was placed and fistulagram obtained which demonstrated the above findings.  This access was then upsized to a 6 F short sheath over a glidewire.  The lesion was crossed with a Bentson wire and then treated with a an 8 mm x 40 mm Mustang balloon with a approximate 30% residual stenosis.  The wire and sheath were then removed and the access was managed with a 4 Monocryl figure-of-eight suture for hemostasis.  Contrast: 25 cc Sedation: None  Impression: Successful balloon angioplasty of the cephalic arch stenosis, 8 mm Mustang balloon. Remains amenable to  endovascular treatment.   Norman GORMAN Serve MD [facility] Office: [phone]

## 2024-09-29 NOTE — H&P (Signed)
 HD ACCESS CENTER H&P   Patient ID: Jacqueline [name], female   DOB: 1945-12-09, 78 y.o.   MRN: [id number]  Subjective:     HPI Jacqueline [name] is a 78 y.o. female with ESRD presenting to the HD access center for intervention.  Past Medical History:  Diagnosis Date   Arthritis    left knee (12/04/2016)   CAD (coronary artery disease)    a. 12/2016 NSTEMI/PCI: LM nl, LaD 20p/d, D1 20, OM2 20, OM3 20, RCA 48m (3.0x18 Resolute DES). EF 65%.   Chronic lower back pain    Diverticulosis    on CT   ESRD on dialysis [facility]    GERD (gastroesophageal reflux disease)    Hiatal hernia    History of echocardiogram    a. 09/2022 Echo: EF 60-65%, mild asymm LVH, mod reduced RV fxn, RVSP . Mildly dil RA. Mild AI. Ao sclerosis. Mild MR.   Hyperlipidemia    Hypertension    a. 12/2016 labetalol  d/c'd 2/2 hypotension in HD.   Morbid obesity (HCC)    Obesity    Persistent atrial fibrillation (HCC)    a. 05/2023 Zio: 100% afib avg HR 52 (32-99).   Pneumonia    couple times (12/04/2016)   Symptomatic bradycardia 06/07/2023   Type II diabetes mellitus (HCC)    Family History  Problem Relation Age of Onset   Diabetes Mother    Peripheral vascular disease Mother        amputation   CAD Father    Heart disease Father    Hypertension Father    Heart attack Father    CAD Sister    Cancer Brother        x 2 brothers, type unknown   Breast cancer Sister    Past Surgical History:  Procedure Laterality Date   A/V FISTULAGRAM N/A 11/12/2023   Procedure: A/V Fistulagram;  Surgeon: Melia Lynwood ORN, MD;  Location: [facility] INVASIVE CV LAB;  Service: Cardiovascular;  Laterality: N/A;   ABDOMINAL HYSTERECTOMY     AV FISTULA PLACEMENT Left 01/27/2014   Procedure: ARTERIOVENOUS (AV) FISTULA CREATION;  Surgeon: Lynwood JONETTA Collum, MD;  Location: [facility] OR;  Service: Vascular;  Laterality: Left;   AV FISTULA REPAIR     had it cleaned out   CARDIAC CATHETERIZATION  ?1980s   CARDIAC CATHETERIZATION N/A 12/04/2016    Procedure: Left Heart Cath Angiography;  Surgeon: Lonni JONETTA Cash, MD;  Location: [facility] INVASIVE CV LAB;  Service: Cardiovascular;  Laterality: N/A;   CARDIAC CATHETERIZATION N/A 12/04/2016   Procedure: Coronary Stent Intervention;  Surgeon: Lonni JONETTA Cash, MD;  Location: [facility] INVASIVE CV LAB;  Service: Cardiovascular;  Laterality: N/A;  Mid RCA   CATARACT EXTRACTION W/ INTRAOCULAR LENS IMPLANT Left    COLONOSCOPY W/ BIOPSIES AND POLYPECTOMY     CORONARY ANGIOPLASTY WITH STENT PLACEMENT  12/04/2016   ESOPHAGOGASTRODUODENOSCOPY     INSERTION OF DIALYSIS CATHETER N/A 01/19/2014   Procedure: INSERTION OF DIALYSIS CATHETER;  Surgeon: Krystal JULIANNA Doing, MD;  Location: [facility] OR;  Service: Vascular;  Laterality: N/A;   PERIPHERAL VASCULAR BALLOON ANGIOPLASTY  11/12/2023   Procedure: PERIPHERAL VASCULAR BALLOON ANGIOPLASTY;  Surgeon: Melia Lynwood ORN, MD;  Location: [facility] INVASIVE CV LAB;  Service: Cardiovascular;;   RIGHT/LEFT HEART CATH AND CORONARY ANGIOGRAPHY N/A 06/11/2023   Procedure: RIGHT/LEFT HEART CATH AND CORONARY ANGIOGRAPHY;  Surgeon: Mady Lonni, MD;  Location: [facility] INVASIVE CV LAB;  Service: Cardiovascular;  Laterality: N/A;    Short Social History:  Social History  Tobacco Use   Smoking status: Former    Current packs/day: 0.00    Average packs/day: 1 pack/day for 20.0 years (20.0 ttl pk-yrs)    Types: Cigarettes    Start date: 63    Quit date: 88    Years since quitting: 40.8   Smokeless tobacco: Never  Substance Use Topics   Alcohol  use: No    Alcohol /week: 0.0 standard drinks of alcohol     Allergies  Allergen Reactions   Sulfa Antibiotics Other (See Comments)   Sulfa Drugs Cross Reactors Other (See Comments)    Doesn't remember     No current facility-administered medications for this encounter.    REVIEW OF SYSTEMS All other systems were reviewed and are negative     Objective:   Objective   Vitals:   09/29/24 0848  BP: 125/70  Pulse: 82  Resp: 12   Temp: 98.2 F (36.8 C)  TempSrc: Oral  SpO2: 98%  Weight: 102 kg  Height: 5' 5 (1.651 m)   Body mass index is 37.42 kg/m.  Physical Exam General: no acute distress Cardiac: hemodynamically stable Extremities: Palpable thrill and pulse in left arm AV access  Data: Reviewed fistulogram from Dr. Melia in December 2024. A 10 mm balloon was used at the cephalic arch     Assessment/Plan:   Jacqueline [name] is a 78 y.o. female with ESRD presenting for fistulogram.  Having issues with prolonged bleeding and low flows. Last HD session yesterday. Reviewed risks and benefits of fistulogram with intervention and patient agreed to proceed.   Norman Serve, MD [facility]

## 2024-10-08 DIAGNOSIS — J441 Chronic obstructive pulmonary disease with (acute) exacerbation: Secondary | ICD-10-CM

## 2024-10-18 ENCOUNTER — Ambulatory Visit: Attending: Pulmonary Disease

## 2024-10-18 DIAGNOSIS — I272 Pulmonary hypertension, unspecified: Secondary | ICD-10-CM

## 2024-10-18 DIAGNOSIS — R0602 Shortness of breath: Secondary | ICD-10-CM | POA: Diagnosis not present

## 2024-10-18 LAB — ECHOCARDIOGRAM COMPLETE
AR max vel: 1.12 cm2
AV Area VTI: 1.34 cm2
AV Area mean vel: 1.49 cm2
AV Mean grad: 3 mmHg
AV Peak grad: 6.3 mmHg
Ao pk vel: 1.25 m/s
Area-P 1/2: 4.96 cm2
S' Lateral: 3 cm

## 2024-11-25 ENCOUNTER — Emergency Department

## 2024-11-25 ENCOUNTER — Other Ambulatory Visit: Payer: Self-pay

## 2024-11-25 ENCOUNTER — Emergency Department
Admission: EM | Admit: 2024-11-25 | Discharge: 2024-11-25 | Disposition: A | Discharge status: Home / Self Care | Attending: Emergency Medicine | Admitting: Emergency Medicine

## 2024-11-25 DIAGNOSIS — S32059A Unspecified fracture of fifth lumbar vertebra, initial encounter for closed fracture: Secondary | ICD-10-CM | POA: Insufficient documentation

## 2024-11-25 DIAGNOSIS — S32050A Wedge compression fracture of fifth lumbar vertebra, initial encounter for closed fracture: Secondary | ICD-10-CM

## 2024-11-25 DIAGNOSIS — N186 End stage renal disease: Secondary | ICD-10-CM | POA: Insufficient documentation

## 2024-11-25 DIAGNOSIS — E1122 Type 2 diabetes mellitus with diabetic chronic kidney disease: Secondary | ICD-10-CM | POA: Insufficient documentation

## 2024-11-25 DIAGNOSIS — K5900 Constipation, unspecified: Secondary | ICD-10-CM | POA: Insufficient documentation

## 2024-11-25 DIAGNOSIS — X58XXXA Exposure to other specified factors, initial encounter: Secondary | ICD-10-CM | POA: Insufficient documentation

## 2024-11-25 LAB — COMPREHENSIVE METABOLIC PANEL WITH GFR
ALT: 15 U/L (ref 0–44)
AST: 34 U/L (ref 15–41)
Albumin: 3.6 g/dL (ref 3.5–5.0)
Alkaline Phosphatase: 201 U/L — ABNORMAL HIGH (ref 38–126)
Anion gap: 12 (ref 5–15)
BUN: 20 mg/dL (ref 8–23)
CO2: 34 mmol/L — ABNORMAL HIGH (ref 22–32)
Calcium: 8.4 mg/dL — ABNORMAL LOW (ref 8.9–10.3)
Chloride: 95 mmol/L — ABNORMAL LOW (ref 98–111)
Creatinine, Ser: 3.32 mg/dL — ABNORMAL HIGH (ref 0.44–1.00)
GFR, Estimated: 13 mL/min — ABNORMAL LOW
Glucose, Bld: 131 mg/dL — ABNORMAL HIGH (ref 70–99)
Potassium: 3.1 mmol/L — ABNORMAL LOW (ref 3.5–5.1)
Sodium: 141 mmol/L (ref 135–145)
Total Bilirubin: 0.4 mg/dL (ref 0.0–1.2)
Total Protein: 6.7 g/dL (ref 6.5–8.1)

## 2024-11-25 LAB — CBC
HCT: 35.2 % — ABNORMAL LOW (ref 36.0–46.0)
Hemoglobin: 10.7 g/dL — ABNORMAL LOW (ref 12.0–15.0)
MCH: 31.7 pg (ref 26.0–34.0)
MCHC: 30.4 g/dL (ref 30.0–36.0)
MCV: 104.1 fL — ABNORMAL HIGH (ref 80.0–100.0)
Platelets: 194 K/uL (ref 150–400)
RBC: 3.38 MIL/uL — ABNORMAL LOW (ref 3.87–5.11)
RDW: 17.3 % — ABNORMAL HIGH (ref 11.5–15.5)
WBC: 9.7 K/uL (ref 4.0–10.5)
nRBC: 0 % (ref 0.0–0.2)

## 2024-11-25 LAB — RESP PANEL BY RT-PCR (RSV, FLU A&B, COVID)  RVPGX2
Influenza A by PCR: NEGATIVE
Influenza B by PCR: NEGATIVE
Resp Syncytial Virus by PCR: NEGATIVE
SARS Coronavirus 2 by RT PCR: NEGATIVE

## 2024-11-25 LAB — LIPASE, BLOOD: Lipase: 14 U/L (ref 11–51)

## 2024-11-25 MED ORDER — IOHEXOL 300 MG/ML  SOLN
80.0000 mL | Freq: Once | INTRAMUSCULAR | Status: AC | PRN
  Administered 2024-11-25: 80 mL via INTRAVENOUS

## 2024-11-25 NOTE — ED Triage Notes (Signed)
 Pt to ED from [facility] for constipation since 5 days. Now starting to feel nauseous and having generalized abdominal pain. Fistula to L arm, ESRD on dialysis. Had SBO 2 weeks ago. Per EMS pt also has a cough and yellow sputum. Pt may also have a chronic wound but EMS unsure. Pt tried for BM this AM and felt dizzy when trying for BM. Pt is on 1L chronic O2.   EMS VS: 102/40, 98% RA, HR 83   Pt states LBM was 7 days ago. Last dialysis was yesterday.

## 2024-11-25 NOTE — ED Notes (Signed)
Pt given remote.  

## 2024-11-25 NOTE — ED Provider Notes (Signed)
 "  [facility] Provider Note    Event Date/Time   First MD Initiated Contact with Patient 11/25/24 1211     (approximate)   History   Abdominal Pain and Constipation   HPI  LUDMILA EBARB is a 78 y.o. female with a history of atrial fibrillation, end-stage renal disease, diabetes who presents with complaints of abdominal pain.  Patient reports she has been constipated for over a week.  She reports pain in her abdomen primarily in the lower abdomen bilaterally.  She felt lightheaded while trying to have a stool today     Physical Exam   Triage Vital Signs: ED Triage Vitals  Encounter Vitals Group     BP 11/25/24 1126 (!) 96/48     Girls Systolic BP Percentile --      Girls Diastolic BP Percentile --      Boys Systolic BP Percentile --      Boys Diastolic BP Percentile --      Pulse Rate 11/25/24 1126 82     Resp 11/25/24 1126 20     Temp 11/25/24 1126 98.1 F (36.7 C)     Temp Source 11/25/24 1126 Oral     SpO2 11/25/24 1126 98 %     Weight 11/25/24 1127 101.8 kg (224 lb 6.9 oz)     Height 11/25/24 1127 1.575 m (5' 2)     Head Circumference --      [facility] Flow --      Pain Score 11/25/24 1127 7     Pain Loc --      Pain Education --      Exclude from Growth Chart --     Most recent vital signs: Vitals:   11/25/24 1400 11/25/24 1500  BP: (!) 102/48 106/75  Pulse:  66  Resp: 19 (!) 21  Temp:    SpO2:  92%     General: Awake, no distress.  CV:  Good peripheral perfusion.  Resp:  Normal effort.  Abd:  Positive distention.  Tenderness palpation left lower quadrant and right lower quadrant Other:     ED Results / Procedures / Treatments   Labs (all labs ordered are listed, but only abnormal results are displayed) Labs Reviewed  COMPREHENSIVE METABOLIC PANEL WITH GFR - Abnormal; Notable for the following components:      Result Value   Potassium 3.1 (*)    Chloride 95 (*)    CO2 34 (*)    Glucose, Bld 131 (*)    Creatinine,  Ser 3.32 (*)    Calcium  8.4 (*)    Alkaline Phosphatase 201 (*)    GFR, Estimated 13 (*)    All other components within normal limits  CBC - Abnormal; Notable for the following components:   RBC 3.38 (*)    Hemoglobin 10.7 (*)    HCT 35.2 (*)    MCV 104.1 (*)    RDW 17.3 (*)    All other components within normal limits  RESP PANEL BY RT-PCR (RSV, FLU A&B, COVID)  RVPGX2  LIPASE, BLOOD  URINALYSIS, ROUTINE W REFLEX MICROSCOPIC     EKG     RADIOLOGY KUB without acute abnormality    PROCEDURES:  Critical Care performed:   Procedures   MEDICATIONS ORDERED IN ED: Medications  iohexol  (OMNIPAQUE ) 300 MG/ML solution 80 mL (80 mLs Intravenous Contrast Given 11/25/24 1352)     IMPRESSION / MDM / ASSESSMENT AND PLAN / ED COURSE  I reviewed the triage  vital signs and the nursing notes. Patient's presentation is most consistent with acute presentation with potential threat to life or bodily function.  Patient presents with abdominal distention, abdominal pain as detailed above, differential includes SBO, ileus, constipation  KUB is suggestive of possible ileus.  Given tenderness will send for CT abdomen pelvis.  Lab work reviewed and is overall reassuring.  CT abdomen pelvis demonstrates L5 compression fracture, this is likely old given the patient's description of her pain.  No evidence of SBO, she is reassured by this, appropriate for discharge with close outpatient follow-up, encouraged increased laxative use      FINAL CLINICAL IMPRESSION(S) / ED DIAGNOSES   Final diagnoses:  Constipation, unspecified constipation type  Compression fracture of L5 lumbar vertebra, closed, initial encounter (HCC)     Rx / DC Orders   ED Discharge Orders     None        Note:  This document was prepared using Dragon voice recognition software and may include unintentional dictation errors.   Arlander Charleston, MD 11/25/24 1524  "

## 2024-12-17 ENCOUNTER — Emergency Department

## 2024-12-17 ENCOUNTER — Inpatient Hospital Stay
Admission: EM | Admit: 2024-12-17 | Discharge: 2024-12-24 | DRG: 193 | Disposition: A | Discharge status: Skilled Nursing Facility | Source: Skilled Nursing Facility

## 2024-12-17 DIAGNOSIS — E1122 Type 2 diabetes mellitus with diabetic chronic kidney disease: Secondary | ICD-10-CM | POA: Diagnosis not present

## 2024-12-17 DIAGNOSIS — Z90710 Acquired absence of both cervix and uterus: Secondary | ICD-10-CM

## 2024-12-17 DIAGNOSIS — S32050A Wedge compression fracture of fifth lumbar vertebra, initial encounter for closed fracture: Secondary | ICD-10-CM | POA: Insufficient documentation

## 2024-12-17 DIAGNOSIS — M4856XA Collapsed vertebra, not elsewhere classified, lumbar region, initial encounter for fracture: Secondary | ICD-10-CM | POA: Diagnosis present

## 2024-12-17 DIAGNOSIS — Z7952 Long term (current) use of systemic steroids: Secondary | ICD-10-CM

## 2024-12-17 DIAGNOSIS — F05 Delirium due to known physiological condition: Secondary | ICD-10-CM | POA: Diagnosis present

## 2024-12-17 DIAGNOSIS — K219 Gastro-esophageal reflux disease without esophagitis: Secondary | ICD-10-CM | POA: Diagnosis present

## 2024-12-17 DIAGNOSIS — Z882 Allergy status to sulfonamides status: Secondary | ICD-10-CM

## 2024-12-17 DIAGNOSIS — I2510 Atherosclerotic heart disease of native coronary artery without angina pectoris: Secondary | ICD-10-CM | POA: Diagnosis present

## 2024-12-17 DIAGNOSIS — I252 Old myocardial infarction: Secondary | ICD-10-CM

## 2024-12-17 DIAGNOSIS — D631 Anemia in chronic kidney disease: Secondary | ICD-10-CM | POA: Diagnosis present

## 2024-12-17 DIAGNOSIS — J9611 Chronic respiratory failure with hypoxia: Secondary | ICD-10-CM | POA: Insufficient documentation

## 2024-12-17 DIAGNOSIS — J159 Unspecified bacterial pneumonia: Secondary | ICD-10-CM | POA: Diagnosis present

## 2024-12-17 DIAGNOSIS — Z79899 Other long term (current) drug therapy: Secondary | ICD-10-CM

## 2024-12-17 DIAGNOSIS — J440 Chronic obstructive pulmonary disease with acute lower respiratory infection: Secondary | ICD-10-CM | POA: Diagnosis present

## 2024-12-17 DIAGNOSIS — Z87891 Personal history of nicotine dependence: Secondary | ICD-10-CM

## 2024-12-17 DIAGNOSIS — Z955 Presence of coronary angioplasty implant and graft: Secondary | ICD-10-CM

## 2024-12-17 DIAGNOSIS — J101 Influenza due to other identified influenza virus with other respiratory manifestations: Secondary | ICD-10-CM | POA: Diagnosis not present

## 2024-12-17 DIAGNOSIS — J189 Pneumonia, unspecified organism: Secondary | ICD-10-CM | POA: Diagnosis present

## 2024-12-17 DIAGNOSIS — I21A1 Myocardial infarction type 2: Secondary | ICD-10-CM | POA: Diagnosis present

## 2024-12-17 DIAGNOSIS — K5641 Fecal impaction: Secondary | ICD-10-CM | POA: Diagnosis present

## 2024-12-17 DIAGNOSIS — N2581 Secondary hyperparathyroidism of renal origin: Secondary | ICD-10-CM | POA: Diagnosis present

## 2024-12-17 DIAGNOSIS — E785 Hyperlipidemia, unspecified: Secondary | ICD-10-CM | POA: Diagnosis present

## 2024-12-17 DIAGNOSIS — Z833 Family history of diabetes mellitus: Secondary | ICD-10-CM

## 2024-12-17 DIAGNOSIS — Z789 Other specified health status: Secondary | ICD-10-CM

## 2024-12-17 DIAGNOSIS — I4821 Permanent atrial fibrillation: Secondary | ICD-10-CM | POA: Diagnosis not present

## 2024-12-17 DIAGNOSIS — I9589 Other hypotension: Secondary | ICD-10-CM | POA: Diagnosis not present

## 2024-12-17 DIAGNOSIS — K224 Dyskinesia of esophagus: Secondary | ICD-10-CM | POA: Diagnosis present

## 2024-12-17 DIAGNOSIS — K5903 Drug induced constipation: Secondary | ICD-10-CM | POA: Diagnosis not present

## 2024-12-17 DIAGNOSIS — R110 Nausea: Secondary | ICD-10-CM | POA: Diagnosis present

## 2024-12-17 DIAGNOSIS — Z794 Long term (current) use of insulin: Secondary | ICD-10-CM

## 2024-12-17 DIAGNOSIS — R0602 Shortness of breath: Secondary | ICD-10-CM

## 2024-12-17 DIAGNOSIS — J1008 Influenza due to other identified influenza virus with other specified pneumonia: Principal | ICD-10-CM | POA: Diagnosis present

## 2024-12-17 DIAGNOSIS — Z803 Family history of malignant neoplasm of breast: Secondary | ICD-10-CM

## 2024-12-17 DIAGNOSIS — Z7901 Long term (current) use of anticoagulants: Secondary | ICD-10-CM

## 2024-12-17 DIAGNOSIS — Z9981 Dependence on supplemental oxygen: Secondary | ICD-10-CM

## 2024-12-17 DIAGNOSIS — I5032 Chronic diastolic (congestive) heart failure: Secondary | ICD-10-CM | POA: Insufficient documentation

## 2024-12-17 DIAGNOSIS — J441 Chronic obstructive pulmonary disease with (acute) exacerbation: Secondary | ICD-10-CM | POA: Diagnosis not present

## 2024-12-17 DIAGNOSIS — N186 End stage renal disease: Secondary | ICD-10-CM | POA: Diagnosis not present

## 2024-12-17 DIAGNOSIS — G9341 Metabolic encephalopathy: Secondary | ICD-10-CM | POA: Diagnosis present

## 2024-12-17 DIAGNOSIS — Z9842 Cataract extraction status, left eye: Secondary | ICD-10-CM

## 2024-12-17 DIAGNOSIS — R059 Cough, unspecified: Secondary | ICD-10-CM

## 2024-12-17 DIAGNOSIS — I132 Hypertensive heart and chronic kidney disease with heart failure and with stage 5 chronic kidney disease, or end stage renal disease: Secondary | ICD-10-CM | POA: Diagnosis present

## 2024-12-17 DIAGNOSIS — D696 Thrombocytopenia, unspecified: Secondary | ICD-10-CM | POA: Diagnosis present

## 2024-12-17 DIAGNOSIS — J9621 Acute and chronic respiratory failure with hypoxia: Secondary | ICD-10-CM | POA: Diagnosis present

## 2024-12-17 DIAGNOSIS — Z961 Presence of intraocular lens: Secondary | ICD-10-CM | POA: Diagnosis present

## 2024-12-17 DIAGNOSIS — K222 Esophageal obstruction: Secondary | ICD-10-CM

## 2024-12-17 DIAGNOSIS — Z992 Dependence on renal dialysis: Secondary | ICD-10-CM

## 2024-12-17 DIAGNOSIS — I4510 Unspecified right bundle-branch block: Secondary | ICD-10-CM | POA: Diagnosis present

## 2024-12-17 DIAGNOSIS — Z6841 Body Mass Index (BMI) 40.0 and over, adult: Secondary | ICD-10-CM

## 2024-12-17 DIAGNOSIS — Z1152 Encounter for screening for COVID-19: Secondary | ICD-10-CM

## 2024-12-17 DIAGNOSIS — I5033 Acute on chronic diastolic (congestive) heart failure: Secondary | ICD-10-CM | POA: Diagnosis present

## 2024-12-17 DIAGNOSIS — Z8249 Family history of ischemic heart disease and other diseases of the circulatory system: Secondary | ICD-10-CM

## 2024-12-17 DIAGNOSIS — R1084 Generalized abdominal pain: Secondary | ICD-10-CM

## 2024-12-17 DIAGNOSIS — R1314 Dysphagia, pharyngoesophageal phase: Secondary | ICD-10-CM | POA: Diagnosis present

## 2024-12-17 DIAGNOSIS — K5900 Constipation, unspecified: Secondary | ICD-10-CM | POA: Diagnosis present

## 2024-12-17 DIAGNOSIS — M109 Gout, unspecified: Secondary | ICD-10-CM | POA: Diagnosis present

## 2024-12-17 LAB — RESP PANEL BY RT-PCR (RSV, FLU A&B, COVID)  RVPGX2
Influenza A by PCR: POSITIVE — AB
Influenza B by PCR: NEGATIVE
Resp Syncytial Virus by PCR: NEGATIVE
SARS Coronavirus 2 by RT PCR: NEGATIVE

## 2024-12-17 LAB — COMPREHENSIVE METABOLIC PANEL WITH GFR
ALT: 13 U/L (ref 0–44)
AST: 33 U/L (ref 15–41)
Albumin: 3.5 g/dL (ref 3.5–5.0)
Alkaline Phosphatase: 181 U/L — ABNORMAL HIGH (ref 38–126)
Anion gap: 15 (ref 5–15)
BUN: 33 mg/dL — ABNORMAL HIGH (ref 8–23)
CO2: 29 mmol/L (ref 22–32)
Calcium: 7.7 mg/dL — ABNORMAL LOW (ref 8.9–10.3)
Chloride: 92 mmol/L — ABNORMAL LOW (ref 98–111)
Creatinine, Ser: 6.23 mg/dL — ABNORMAL HIGH (ref 0.44–1.00)
GFR, Estimated: 6 mL/min — ABNORMAL LOW
Glucose, Bld: 181 mg/dL — ABNORMAL HIGH (ref 70–99)
Potassium: 4.4 mmol/L (ref 3.5–5.1)
Sodium: 136 mmol/L (ref 135–145)
Total Bilirubin: 0.3 mg/dL (ref 0.0–1.2)
Total Protein: 6.6 g/dL (ref 6.5–8.1)

## 2024-12-17 LAB — CBC WITH DIFFERENTIAL/PLATELET
Abs Immature Granulocytes: 0.03 K/uL (ref 0.00–0.07)
Basophils Absolute: 0 K/uL (ref 0.0–0.1)
Basophils Relative: 0 %
Eosinophils Absolute: 0 K/uL (ref 0.0–0.5)
Eosinophils Relative: 0 %
HCT: 33.2 % — ABNORMAL LOW (ref 36.0–46.0)
Hemoglobin: 10.3 g/dL — ABNORMAL LOW (ref 12.0–15.0)
Immature Granulocytes: 1 %
Lymphocytes Relative: 4 %
Lymphs Abs: 0.2 K/uL — ABNORMAL LOW (ref 0.7–4.0)
MCH: 32.1 pg (ref 26.0–34.0)
MCHC: 31 g/dL (ref 30.0–36.0)
MCV: 103.4 fL — ABNORMAL HIGH (ref 80.0–100.0)
Monocytes Absolute: 0.3 K/uL (ref 0.1–1.0)
Monocytes Relative: 5 %
Neutro Abs: 5.6 K/uL (ref 1.7–7.7)
Neutrophils Relative %: 90 %
Platelets: 108 K/uL — ABNORMAL LOW (ref 150–400)
RBC: 3.21 MIL/uL — ABNORMAL LOW (ref 3.87–5.11)
RDW: 18.8 % — ABNORMAL HIGH (ref 11.5–15.5)
WBC: 6.1 K/uL (ref 4.0–10.5)
nRBC: 0 % (ref 0.0–0.2)

## 2024-12-17 LAB — CBC
HCT: 33.2 % — ABNORMAL LOW (ref 36.0–46.0)
Hemoglobin: 10.1 g/dL — ABNORMAL LOW (ref 12.0–15.0)
MCH: 31.3 pg (ref 26.0–34.0)
MCHC: 30.4 g/dL (ref 30.0–36.0)
MCV: 102.8 fL — ABNORMAL HIGH (ref 80.0–100.0)
Platelets: 131 K/uL — ABNORMAL LOW (ref 150–400)
RBC: 3.23 MIL/uL — ABNORMAL LOW (ref 3.87–5.11)
RDW: 18.9 % — ABNORMAL HIGH (ref 11.5–15.5)
WBC: 6.6 K/uL (ref 4.0–10.5)
nRBC: 0 % (ref 0.0–0.2)

## 2024-12-17 LAB — CREATININE, SERUM
Creatinine, Ser: 6.5 mg/dL — ABNORMAL HIGH (ref 0.44–1.00)
GFR, Estimated: 6 mL/min — ABNORMAL LOW

## 2024-12-17 LAB — PRO BRAIN NATRIURETIC PEPTIDE: Pro Brain Natriuretic Peptide: >35000 pg/mL — ABNORMAL HIGH

## 2024-12-17 LAB — CBG MONITORING, ED: Glucose-Capillary: 173 mg/dL — ABNORMAL HIGH (ref 70–99)

## 2024-12-17 LAB — PROCALCITONIN: Procalcitonin: 1.01 ng/mL

## 2024-12-17 LAB — TROPONIN T, HIGH SENSITIVITY
Troponin T High Sensitivity: 165 ng/L (ref 0–19)
Troponin T High Sensitivity: 161 ng/L (ref 0–19)

## 2024-12-17 LAB — HEPATITIS B SURFACE ANTIGEN: Hepatitis B Surface Ag: NONREACTIVE

## 2024-12-17 MED ORDER — ACETAMINOPHEN 650 MG RE SUPP: 650.0000 mg | Freq: Four times a day (QID) | RECTAL | Status: DC | PRN

## 2024-12-17 MED ORDER — PANTOPRAZOLE SODIUM 40 MG PO TBEC
40.0000 mg | DELAYED_RELEASE_TABLET | Freq: Every day | ORAL | Status: DC
  Administered 2024-12-18 – 2024-12-20 (×3): 40 mg via ORAL
  Filled 2024-12-17 (×3): qty 1

## 2024-12-17 MED ORDER — ONDANSETRON HCL 4 MG PO TABS: 4.0000 mg | ORAL_TABLET | Freq: Four times a day (QID) | ORAL | Status: DC | PRN

## 2024-12-17 MED ORDER — IPRATROPIUM-ALBUTEROL 0.5-2.5 (3) MG/3ML IN SOLN
9.0000 mL | Freq: Once | RESPIRATORY_TRACT | Status: AC
  Administered 2024-12-17: 9 mL via RESPIRATORY_TRACT
  Filled 2024-12-17: qty 9

## 2024-12-17 MED ORDER — APIXABAN 2.5 MG PO TABS
2.5000 mg | ORAL_TABLET | Freq: Two times a day (BID) | ORAL | Status: DC
  Administered 2024-12-17 – 2024-12-19 (×4): 2.5 mg via ORAL
  Filled 2024-12-17 (×5): qty 1

## 2024-12-17 MED ORDER — SENNA 8.6 MG PO TABS
1.0000 | ORAL_TABLET | Freq: Two times a day (BID) | ORAL | Status: DC
  Administered 2024-12-18 – 2024-12-23 (×12): 8.6 mg via ORAL
  Filled 2024-12-17 (×13): qty 1

## 2024-12-17 MED ORDER — POLYETHYLENE GLYCOL 3350 17 G PO PACK: 17.0000 g | PACK | Freq: Every day | ORAL | Status: DC

## 2024-12-17 MED ORDER — PREDNISONE 20 MG PO TABS
40.0000 mg | ORAL_TABLET | Freq: Every day | ORAL | Status: AC
  Administered 2024-12-18 – 2024-12-22 (×5): 40 mg via ORAL
  Filled 2024-12-17 (×4): qty 2

## 2024-12-17 MED ORDER — OSELTAMIVIR PHOSPHATE 30 MG PO CAPS
30.0000 mg | ORAL_CAPSULE | Freq: Every day | ORAL | Status: DC
  Filled 2024-12-17: qty 1

## 2024-12-17 MED ORDER — ALUM & MAG HYDROXIDE-SIMETH 200-200-20 MG/5 ML NICU TOPICAL
1.0000 | Freq: Once | TOPICAL | Status: AC
  Administered 2024-12-17: 1 via TOPICAL
  Filled 2024-12-17: qty 30
  Filled 2024-12-17: qty 10

## 2024-12-17 MED ORDER — SODIUM CHLORIDE 0.9 % IV SOLN
1.0000 g | Freq: Once | INTRAVENOUS | Status: AC
  Administered 2024-12-17: 1 g via INTRAVENOUS
  Filled 2024-12-17: qty 10

## 2024-12-17 MED ORDER — OXYCODONE HCL 5 MG PO TABS
5.0000 mg | ORAL_TABLET | Freq: Four times a day (QID) | ORAL | Status: DC | PRN
  Administered 2024-12-19: 5 mg via ORAL
  Filled 2024-12-17 (×2): qty 1

## 2024-12-17 MED ORDER — SODIUM CHLORIDE 0.9 % IV SOLN
1.0000 g | INTRAVENOUS | Status: DC
  Administered 2024-12-18 – 2024-12-21 (×4): 1 g via INTRAVENOUS
  Filled 2024-12-17 (×5): qty 10

## 2024-12-17 MED ORDER — LIDOCAINE 5 % EX PTCH
1.0000 | MEDICATED_PATCH | CUTANEOUS | Status: DC
  Administered 2024-12-18 – 2024-12-23 (×6): 1 via TRANSDERMAL
  Filled 2024-12-17 (×7): qty 1

## 2024-12-17 MED ORDER — SODIUM CHLORIDE 0.9 % IV SOLN
100.0000 mg | Freq: Once | INTRAVENOUS | Status: AC
  Administered 2024-12-17: 100 mg via INTRAVENOUS
  Filled 2024-12-17 (×2): qty 100

## 2024-12-17 MED ORDER — ROSUVASTATIN CALCIUM 10 MG PO TABS
10.0000 mg | ORAL_TABLET | Freq: Every day | ORAL | Status: DC
  Administered 2024-12-18 – 2024-12-24 (×8): 10 mg via ORAL
  Filled 2024-12-17 (×8): qty 1

## 2024-12-17 MED ORDER — NALOXONE HCL 4 MG/0.1ML NA LIQD: 1.0000 | Freq: Three times a day (TID) | NASAL | Status: DC | PRN

## 2024-12-17 MED ORDER — MIDODRINE HCL 5 MG PO TABS
10.0000 mg | ORAL_TABLET | Freq: Three times a day (TID) | ORAL | Status: DC
  Administered 2024-12-18 – 2024-12-24 (×20): 10 mg via ORAL
  Filled 2024-12-17 (×19): qty 2

## 2024-12-17 MED ORDER — METHYLPREDNISOLONE SODIUM SUCC 125 MG IJ SOLR
125.0000 mg | Freq: Once | INTRAMUSCULAR | Status: AC
  Administered 2024-12-17: 125 mg via INTRAVENOUS
  Filled 2024-12-17: qty 2

## 2024-12-17 MED ORDER — BISACODYL 10 MG RE SUPP
10.0000 mg | Freq: Every day | RECTAL | Status: DC | PRN
  Filled 2024-12-17: qty 1

## 2024-12-17 MED ORDER — ACETAMINOPHEN 325 MG PO TABS: 650.0000 mg | ORAL_TABLET | Freq: Four times a day (QID) | ORAL | Status: DC | PRN

## 2024-12-17 MED ORDER — CHLORHEXIDINE GLUCONATE CLOTH 2 % EX PADS
6.0000 | MEDICATED_PAD | Freq: Every day | CUTANEOUS | Status: DC
  Filled 2024-12-17: qty 6

## 2024-12-17 MED ORDER — ACETAMINOPHEN 325 MG PO TABS
975.0000 mg | ORAL_TABLET | Freq: Four times a day (QID) | ORAL | Status: DC | PRN
  Administered 2024-12-17 – 2024-12-23 (×2): 975 mg via ORAL
  Filled 2024-12-17 (×2): qty 3

## 2024-12-17 MED ORDER — OSELTAMIVIR PHOSPHATE 30 MG PO CAPS
30.0000 mg | ORAL_CAPSULE | ORAL | Status: AC
  Administered 2024-12-17: 30 mg via ORAL
  Filled 2024-12-17 (×3): qty 1

## 2024-12-17 MED ORDER — MINERAL OIL RE ENEM
1.0000 | ENEMA | Freq: Once | RECTAL | Status: AC
  Administered 2024-12-18: 1 via RECTAL

## 2024-12-17 MED ORDER — EZETIMIBE 10 MG PO TABS
10.0000 mg | ORAL_TABLET | Freq: Every day | ORAL | Status: DC
  Administered 2024-12-18 – 2024-12-24 (×7): 10 mg via ORAL
  Filled 2024-12-17 (×7): qty 1

## 2024-12-17 MED ORDER — LACTULOSE 10 GM/15ML PO SOLN
20.0000 g | Freq: Three times a day (TID) | ORAL | Status: DC
  Administered 2024-12-18 – 2024-12-23 (×18): 20 g via ORAL
  Filled 2024-12-17 (×19): qty 30

## 2024-12-17 MED ORDER — HEPARIN SODIUM (PORCINE) 5000 UNIT/ML IJ SOLN: 5000.0000 [IU] | Freq: Three times a day (TID) | INTRAMUSCULAR | Status: DC

## 2024-12-17 MED ORDER — ONDANSETRON HCL 4 MG/2ML IJ SOLN
4.0000 mg | Freq: Four times a day (QID) | INTRAMUSCULAR | Status: DC | PRN
  Administered 2024-12-17 – 2024-12-19 (×2): 4 mg via INTRAVENOUS
  Filled 2024-12-17 (×2): qty 2

## 2024-12-17 MED ORDER — UMECLIDINIUM-VILANTEROL 62.5-25 MCG/ACT IN AEPB
1.0000 | INHALATION_SPRAY | Freq: Every day | RESPIRATORY_TRACT | Status: DC
  Administered 2024-12-18 – 2024-12-24 (×7): 1 via RESPIRATORY_TRACT
  Filled 2024-12-17 (×2): qty 14

## 2024-12-17 MED ORDER — ALLOPURINOL 100 MG PO TABS
100.0000 mg | ORAL_TABLET | Freq: Every day | ORAL | Status: DC
  Administered 2024-12-17 – 2024-12-24 (×8): 100 mg via ORAL
  Filled 2024-12-17 (×8): qty 1

## 2024-12-17 MED ORDER — INSULIN ASPART 100 UNIT/ML IJ SOLN
0.0000 [IU] | Freq: Three times a day (TID) | INTRAMUSCULAR | Status: DC
  Administered 2024-12-18: 4 [IU] via SUBCUTANEOUS
  Administered 2024-12-19 – 2024-12-20 (×3): 3 [IU] via SUBCUTANEOUS
  Administered 2024-12-21: 4 [IU] via SUBCUTANEOUS
  Administered 2024-12-22: 11 [IU] via SUBCUTANEOUS
  Administered 2024-12-22: 4 [IU] via SUBCUTANEOUS
  Administered 2024-12-23 – 2024-12-24 (×2): 3 [IU] via SUBCUTANEOUS
  Filled 2024-12-17: qty 3
  Filled 2024-12-17: qty 7
  Filled 2024-12-17: qty 4
  Filled 2024-12-17 (×4): qty 3
  Filled 2024-12-17: qty 4
  Filled 2024-12-17: qty 11
  Filled 2024-12-17: qty 3
  Filled 2024-12-17: qty 4
  Filled 2024-12-17: qty 3

## 2024-12-17 MED ORDER — FLUDROCORTISONE ACETATE 0.1 MG PO TABS
0.2000 mg | ORAL_TABLET | Freq: Two times a day (BID) | ORAL | Status: DC
  Administered 2024-12-17 – 2024-12-24 (×13): 0.2 mg via ORAL
  Filled 2024-12-17 (×14): qty 2

## 2024-12-17 MED ORDER — LIDOCAINE 4 % EX CREA
1.0000 | TOPICAL_CREAM | Freq: Two times a day (BID) | CUTANEOUS | Status: DC
  Administered 2024-12-18 – 2024-12-24 (×12): 1 via TOPICAL
  Filled 2024-12-17 (×2): qty 5

## 2024-12-17 MED ORDER — DOXYCYCLINE HYCLATE 100 MG PO TABS
100.0000 mg | ORAL_TABLET | Freq: Two times a day (BID) | ORAL | Status: DC
  Administered 2024-12-18 – 2024-12-22 (×8): 100 mg via ORAL
  Filled 2024-12-17 (×9): qty 1

## 2024-12-17 MED ORDER — DROXIDOPA 100 MG PO CAPS
200.0000 mg | ORAL_CAPSULE | Freq: Three times a day (TID) | ORAL | Status: DC
  Administered 2024-12-18 – 2024-12-24 (×16): 200 mg via ORAL
  Filled 2024-12-17 (×21): qty 2

## 2024-12-17 MED ORDER — IPRATROPIUM-ALBUTEROL 0.5-2.5 (3) MG/3ML IN SOLN
3.0000 mL | RESPIRATORY_TRACT | Status: DC | PRN
  Administered 2024-12-21 – 2024-12-23 (×4): 3 mL via RESPIRATORY_TRACT
  Filled 2024-12-17 (×4): qty 3

## 2024-12-17 NOTE — ED Notes (Signed)
 Report given to [facility].

## 2024-12-17 NOTE — ED Notes (Signed)
 Son is at bedside

## 2024-12-17 NOTE — ED Triage Notes (Addendum)
 Pt arrives via EMS from [facility]. Pt sent for constipation and vomiting. Per EMS, pt has cough and c/o feeling generally unwell. Pt is normally on home oxygen  at 1lpm. Pt seen on Christmas day for same sx. Last dialysis was Tuesday, pt missed yesterday. Pt said she is not getting her stool softener and laxatives at [facility].  CBG 201

## 2024-12-17 NOTE — Progress Notes (Signed)
 Referring Provider: No ref. provider found Primary Care Physician:  Austin Candyce Stall, MD Primary Nephrologist:  Dr.   [place] for Consultation:  ESRD  HPI:  79 y.o. female with history of A-fib on Eliquis , ESRD on dialysis Tuesday Thursday Saturday, CAD, and hypertension, COPD on 1 L nasal cannula at baseline, CHF, presenting with constipation and vomiting.  Also states that she has had a cough that is chronic.  Does note intermittent shortness of breath, feels short of breath at this time.  States that she had a flu or COVID swab done yesterday that was negative.  States that she felt unwell and did not go to dialysis.  States that she has crampy abdominal pain diffusely. Per independent history from EMS, she has been having a cough.  Is on her home O2 at this time.  Her last dialysis was Tuesday since she missed her dialysis yesterday.  Past Medical History:  Diagnosis Date   Arthritis    left knee (12/04/2016)   CAD (coronary artery disease)    a. 12/2016 NSTEMI/PCI: LM nl, LaD 20p/d, D1 20, OM2 20, OM3 20, RCA 26m (3.0x18 Resolute DES). EF 65%.   Chronic lower back pain    Diverticulosis    on CT   ESRD on dialysis [facility])    GERD (gastroesophageal reflux disease)    Hiatal hernia    History of echocardiogram    a. 09/2022 Echo: EF 60-65%, mild asymm LVH, mod reduced RV fxn, RVSP . Mildly dil RA. Mild AI. Ao sclerosis. Mild MR.   Hyperlipidemia    Hypertension    a. 12/2016 labetalol  d/c'd 2/2 hypotension in HD.   Morbid obesity (HCC)    Obesity    Persistent atrial fibrillation (HCC)    a. 05/2023 Zio: 100% afib avg HR 52 (32-99).   Pneumonia    couple times (12/04/2016)   Symptomatic bradycardia 06/07/2023   Type II diabetes mellitus [facility])     Past Surgical History:  Procedure Laterality Date   A/V FISTULAGRAM N/A 11/12/2023   Procedure: A/V Fistulagram;  Surgeon: Melia Lynwood ORN, MD;  Location: [facility] INVASIVE CV LAB;  Service: Cardiovascular;  Laterality: N/A;   A/V FISTULAGRAM  Left 09/29/2024   Procedure: A/V Fistulagram;  Surgeon: Pearline Norman RAMAN, MD;  Location: [facility] PV LAB;  Service: Cardiovascular;  Laterality: Left;   ABDOMINAL HYSTERECTOMY     AV FISTULA PLACEMENT Left 01/27/2014   Procedure: ARTERIOVENOUS (AV) FISTULA CREATION;  Surgeon: Lynwood JONETTA Collum, MD;  Location: [facility] OR;  Service: Vascular;  Laterality: Left;   AV FISTULA REPAIR     had it cleaned out   CARDIAC CATHETERIZATION  ?1980s   CARDIAC CATHETERIZATION N/A 12/04/2016   Procedure: Left Heart Cath Angiography;  Surgeon: Lonni JONETTA Cash, MD;  Location: [facility] INVASIVE CV LAB;  Service: Cardiovascular;  Laterality: N/A;   CARDIAC CATHETERIZATION N/A 12/04/2016   Procedure: Coronary Stent Intervention;  Surgeon: Lonni JONETTA Cash, MD;  Location: [facility] INVASIVE CV LAB;  Service: Cardiovascular;  Laterality: N/A;  Mid RCA   CATARACT EXTRACTION W/ INTRAOCULAR LENS IMPLANT Left    COLONOSCOPY W/ BIOPSIES AND POLYPECTOMY     CORONARY ANGIOPLASTY WITH STENT PLACEMENT  12/04/2016   ESOPHAGOGASTRODUODENOSCOPY     INSERTION OF DIALYSIS CATHETER N/A 01/19/2014   Procedure: INSERTION OF DIALYSIS CATHETER;  Surgeon: Krystal JULIANNA Doing, MD;  Location: [facility] OR;  Service: Vascular;  Laterality: N/A;   PERIPHERAL VASCULAR BALLOON ANGIOPLASTY  11/12/2023   Procedure: PERIPHERAL VASCULAR BALLOON ANGIOPLASTY;  Surgeon:  Melia Lynwood ORN, MD;  Location: [facility] INVASIVE CV LAB;  Service: Cardiovascular;;   RIGHT/LEFT HEART CATH AND CORONARY ANGIOGRAPHY N/A 06/11/2023   Procedure: RIGHT/LEFT HEART CATH AND CORONARY ANGIOGRAPHY;  Surgeon: Mady Bruckner, MD;  Location: [facility] INVASIVE CV LAB;  Service: Cardiovascular;  Laterality: N/A;   VENOUS ANGIOPLASTY  09/29/2024   Procedure: VENOUS ANGIOPLASTY;  Surgeon: Pearline Norman RAMAN, MD;  Location: [facility] PV LAB;  Service: Cardiovascular;;  cephalic arch    Prior to Admission medications  Medication Sig Start Date End Date Taking? Authorizing Provider  acetaminophen  (TYLENOL ) 650 MG CR tablet  Take 650 mg by mouth every 8 (eight) hours. (0600, 1400 & 2200)    [provider]  albuterol  (PROVENTIL ) (2.5 MG/3ML) 0.083% nebulizer solution Take 2.5 mg by nebulization every 6 (six) hours as needed for wheezing or shortness of breath.    [provider]  allopurinol  (ZYLOPRIM ) 100 MG tablet Take 100 mg by mouth daily.    [provider]  aluminum -magnesium  hydroxide-simethicone  (MAALOX) 200-200-20 MG/5ML SUSP Take 30 mLs by mouth 4 (four) times daily -  before meals and at bedtime. 07/20/23   Viviann Pastor, MD  apixaban  (ELIQUIS ) 2.5 MG TABS tablet Take 1 tablet (2.5 mg total) by mouth 2 (two) times daily. 06/25/24   Arlon Carliss ORN, DO  benzonatate  (TESSALON ) 100 MG capsule Take 1 capsule (100 mg total) by mouth 3 (three) times daily. 06/25/24   Arlon Carliss ORN, DO  bisacodyl  (DULCOLAX) 10 MG suppository Place 10 mg rectally daily as needed for moderate constipation.    [provider]  cinacalcet  (SENSIPAR ) 30 MG tablet Take 30 mg by mouth daily with supper. (1800)    [provider]  clotrimazole-betamethasone (LOTRISONE) cream Apply 1 Application topically See admin instructions. Apply as needed to affected area of ear for itching/irritation.    [provider]  dextromethorphan -guaiFENesin  (MUCINEX  DM) 30-600 MG 12hr tablet Take 1 tablet by mouth 2 (two) times daily. (0900 & 1800)    [provider]  Droxidopa  200 MG CAPS Take 200 mg by mouth with breakfast, with lunch, and with evening meal. 01/27/24   Malvina Alm DASEN, MD  ezetimibe  (ZETIA ) 10 MG tablet Take 1 tablet (10 mg total) by mouth daily. 04/30/22   Darron Deatrice LABOR, MD  ferric citrate  (AURYXIA ) 1 GM 210 MG(Fe) tablet Take 420 mg by mouth 3 (three) times daily with meals. Patient not taking: Reported on 09/17/2024    [provider]  fexofenadine (ALLEGRA) 180 MG tablet Take 180 mg by mouth in the morning. (0800)    [provider]  fludrocortisone   (FLORINEF ) 0.1 MG tablet Take 2 tablets (0.2 mg total) by mouth 2 (two) times daily. 07/01/23   Fausto Burnard LABOR, DO  fluticasone (FLONASE) 50 MCG/ACT nasal spray Place 1 spray into both nostrils in the morning. (0900)    [provider]  guaiFENesin -dextromethorphan  (ROBITUSSIN DM) 100-10 MG/5ML syrup Take 15 mLs by mouth every 4 (four) hours as needed for cough. 07/01/23   Fausto Burnard LABOR, DO  HYDROcodone -acetaminophen  (NORCO/VICODIN) 5-325 MG tablet Take 1-2 tablets by mouth every 6 (six) hours as needed for moderate pain. 07/01/23   Fausto Burnard LABOR, DO  insulin  aspart (NOVOLOG ) 100 UNIT/ML injection Inject 0-20 Units into the skin 3 (three) times daily with meals. For CBG 70-120: 0 units For CBG 121-150: 3 units For CBG 151-200: 4 units For CBG 201-250: 7 units For CBG 251-300: 11 units For CBG 301-350: 15 units For CBG  351-400: 20 units For CBG > 400: 25 units and call your doctor 07/01/23   Fausto Sor A, DO  insulin  glargine-yfgn (SEMGLEE ) 100 UNIT/ML injection Inject 0.05 mLs (5 Units total) into the skin daily. 07/02/23   Fausto Sor LABOR, DO  ipratropium-albuterol  (DUONEB) 0.5-2.5 (3) MG/3ML SOLN Take 3 mLs by nebulization 2 (two) times daily. 06/25/24   Arlon Carliss ORN, DO  Lidocaine  HCl (ASPERCREME LIDOCAINE ) 4 % CREA Apply 1 Application topically in the morning and at bedtime. Applied to bilateral knees    [provider]  magnesium  hydroxide (MILK OF MAGNESIA) 400 MG/5ML suspension Take 30 mLs by mouth daily as needed (if no BM in 3 days.).    [provider]  Menthol -Zinc  Oxide (MOISTURE BARRIER EX) Apply 1 Application topically as directed. Apply barrier cream to buttocks every shift    [provider]  midodrine  (PROAMATINE ) 10 MG tablet Take 1 tablet (10 mg total) by mouth 3 (three) times daily with meals. 06/09/23   Shellia Inge BIRCH, NP  midodrine  (PROAMATINE ) 10 MG tablet Take 20 mg by mouth as needed (for systolic BP less than 100.).     [provider]  Multiple Vitamin (MULTIVITAMIN WITH MINERALS) TABS tablet Take 1 tablet by mouth at bedtime.    [provider]  naloxone  (NARCAN ) nasal spray 4 mg/0.1 mL Place 1 spray into the nose 3 (three) times daily as needed (signs and symptoms of opioid overdose).    [provider]  nitroGLYCERIN  (NITROSTAT ) 0.4 MG SL tablet Place 1 tablet (0.4 mg total) under the tongue every 5 (five) minutes as needed for chest pain. 07/01/23   Fausto Sor LABOR, DO  ondansetron  (ZOFRAN -ODT) 4 MG disintegrating tablet Take 4 mg by mouth every 8 (eight) hours as needed for nausea or vomiting.    [provider]  OXYGEN  Inhale 2 L/min into the lungs as needed (shortness of breath or decreased O2).    [provider]  pantoprazole  (PROTONIX ) 40 MG tablet Take 1 tablet (40 mg total) by mouth daily. 07/02/23   Fausto Sor LABOR, DO  polyethylene glycol (MIRALAX  / GLYCOLAX ) 17 g packet Take 17 g by mouth daily as needed for moderate constipation. 06/09/23   Keene, Jeremiah D, NP  polyvinyl alcohol  (LIQUIFILM TEARS) 1.4 % ophthalmic solution Place 1 drop into both eyes as needed for dry eyes. 07/01/23   Fausto Sor LABOR, DO  rosuvastatin  (CRESTOR ) 10 MG tablet Take 1 tablet (10 mg total) by mouth daily. 10/03/22   Jhonny Calvin NOVAK, MD  sodium phosphate (FLEET) ENEM Place 1 enema rectally daily as needed for severe constipation (if no BM results in 12 hours from dulcolax suppository).    [provider]  sorbitol  70 % solution Take 30 mLs by mouth daily as needed (constipation.).    [provider]    Current Facility-Administered Medications  Medication Dose Route Frequency Provider Last Rate Last Admin   acetaminophen  (TYLENOL ) tablet 650 mg  650 mg Oral Q6H PRN Al-Sultani, Anmar, MD       Or   acetaminophen  (TYLENOL ) suppository 650 mg  650 mg Rectal Q6H PRN Al-Sultani, Anmar, MD       bisacodyl  (DULCOLAX) suppository 10 mg  10 mg Rectal Daily PRN  Al-Sultani, Anmar, MD       [START ON 12/18/2024] Chlorhexidine  Gluconate Cloth 2 % PADS 6 each  6 each Topical Q0600 Ryver Zadrozny, MD       doxycycline  (VIBRAMYCIN ) 100 mg in sodium chloride   0.9 % 250 mL IVPB  100 mg Intravenous Once Waymond Lorelle Cummins, MD 125 mL/hr at 12/17/24 1643 100 mg at 12/17/24 1643   heparin  injection 5,000 Units  5,000 Units Subcutaneous Q8H Al-Sultani, Anmar, MD       ipratropium-albuterol  (DUONEB) 0.5-2.5 (3) MG/3ML nebulizer solution 3 mL  3 mL Nebulization Q4H PRN Al-Sultani, Anmar, MD       mineral oil enema 1 enema  1 enema Rectal Once Mian, Michael A, MD       ondansetron  (ZOFRAN ) tablet 4 mg  4 mg Oral Q6H PRN Al-Sultani, Anmar, MD       Or   ondansetron  (ZOFRAN ) injection 4 mg  4 mg Intravenous Q6H PRN Al-Sultani, Anmar, MD       polyethylene glycol (MIRALAX  / GLYCOLAX ) packet 17 g  17 g Oral Daily Al-Sultani, Anmar, MD       senna (SENOKOT) tablet 8.6 mg  1 tablet Oral BID Al-Sultani, Anmar, MD       Current Outpatient Medications  Medication Sig Dispense Refill   acetaminophen  (TYLENOL ) 650 MG CR tablet Take 650 mg by mouth every 8 (eight) hours. (0600, 1400 & 2200)     albuterol  (PROVENTIL ) (2.5 MG/3ML) 0.083% nebulizer solution Take 2.5 mg by nebulization every 6 (six) hours as needed for wheezing or shortness of breath.     allopurinol  (ZYLOPRIM ) 100 MG tablet Take 100 mg by mouth daily.     aluminum -magnesium  hydroxide-simethicone  (MAALOX) 200-200-20 MG/5ML SUSP Take 30 mLs by mouth 4 (four) times daily -  before meals and at bedtime. 355 mL 0   apixaban  (ELIQUIS ) 2.5 MG TABS tablet Take 1 tablet (2.5 mg total) by mouth 2 (two) times daily.     benzonatate  (TESSALON ) 100 MG capsule Take 1 capsule (100 mg total) by mouth 3 (three) times daily. 20 capsule 0   bisacodyl  (DULCOLAX) 10 MG suppository Place 10 mg rectally daily as needed for moderate constipation.     cinacalcet  (SENSIPAR ) 30 MG tablet Take 30 mg by mouth daily with supper. (1800)      clotrimazole-betamethasone (LOTRISONE) cream Apply 1 Application topically See admin instructions. Apply as needed to affected area of ear for itching/irritation.     dextromethorphan -guaiFENesin  (MUCINEX  DM) 30-600 MG 12hr tablet Take 1 tablet by mouth 2 (two) times daily. (0900 & 1800)     Droxidopa  200 MG CAPS Take 200 mg by mouth with breakfast, with lunch, and with evening meal. 90 capsule 0   ezetimibe  (ZETIA ) 10 MG tablet Take 1 tablet (10 mg total) by mouth daily. 30 tablet 5   ferric citrate  (AURYXIA ) 1 GM 210 MG(Fe) tablet Take 420 mg by mouth 3 (three) times daily with meals. (Patient not taking: Reported on 09/17/2024)     fexofenadine (ALLEGRA) 180 MG tablet Take 180 mg by mouth in the morning. (0800)     fludrocortisone  (FLORINEF ) 0.1 MG tablet Take 2 tablets (0.2 mg total) by mouth 2 (two) times daily.     fluticasone (FLONASE) 50 MCG/ACT nasal spray Place 1 spray into both nostrils in the morning. (0900)     guaiFENesin -dextromethorphan  (ROBITUSSIN DM) 100-10 MG/5ML syrup Take 15 mLs by mouth every 4 (four) hours as needed for cough.     HYDROcodone -acetaminophen  (NORCO/VICODIN) 5-325 MG tablet Take 1-2 tablets by mouth every 6 (six) hours as needed for moderate pain. 30 tablet 0   insulin  aspart (NOVOLOG ) 100 UNIT/ML injection Inject 0-20 Units into the skin 3 (three) times daily with meals. For CBG  70-120: 0 units For CBG 121-150: 3 units For CBG 151-200: 4 units For CBG 201-250: 7 units For CBG 251-300: 11 units For CBG 301-350: 15 units For CBG 351-400: 20 units For CBG > 400: 25 units and call your doctor     insulin  glargine-yfgn (SEMGLEE ) 100 UNIT/ML injection Inject 0.05 mLs (5 Units total) into the skin daily.     ipratropium-albuterol  (DUONEB) 0.5-2.5 (3) MG/3ML SOLN Take 3 mLs by nebulization 2 (two) times daily.     Lidocaine  HCl (ASPERCREME LIDOCAINE ) 4 % CREA Apply 1 Application topically in the morning and at bedtime. Applied to bilateral knees     magnesium   hydroxide (MILK OF MAGNESIA) 400 MG/5ML suspension Take 30 mLs by mouth daily as needed (if no BM in 3 days.).     Menthol -Zinc  Oxide (MOISTURE BARRIER EX) Apply 1 Application topically as directed. Apply barrier cream to buttocks every shift     midodrine  (PROAMATINE ) 10 MG tablet Take 1 tablet (10 mg total) by mouth 3 (three) times daily with meals.     midodrine  (PROAMATINE ) 10 MG tablet Take 20 mg by mouth as needed (for systolic BP less than 100.).     Multiple Vitamin (MULTIVITAMIN WITH MINERALS) TABS tablet Take 1 tablet by mouth at bedtime.     naloxone  (NARCAN ) nasal spray 4 mg/0.1 mL Place 1 spray into the nose 3 (three) times daily as needed (signs and symptoms of opioid overdose).     nitroGLYCERIN  (NITROSTAT ) 0.4 MG SL tablet Place 1 tablet (0.4 mg total) under the tongue every 5 (five) minutes as needed for chest pain.     ondansetron  (ZOFRAN -ODT) 4 MG disintegrating tablet Take 4 mg by mouth every 8 (eight) hours as needed for nausea or vomiting.     OXYGEN  Inhale 2 L/min into the lungs as needed (shortness of breath or decreased O2).     pantoprazole  (PROTONIX ) 40 MG tablet Take 1 tablet (40 mg total) by mouth daily.     polyethylene glycol (MIRALAX  / GLYCOLAX ) 17 g packet Take 17 g by mouth daily as needed for moderate constipation. 14 each 0   polyvinyl alcohol  (LIQUIFILM TEARS) 1.4 % ophthalmic solution Place 1 drop into both eyes as needed for dry eyes.     rosuvastatin  (CRESTOR ) 10 MG tablet Take 1 tablet (10 mg total) by mouth daily.     sodium phosphate (FLEET) ENEM Place 1 enema rectally daily as needed for severe constipation (if no BM results in 12 hours from dulcolax suppository).     sorbitol  70 % solution Take 30 mLs by mouth daily as needed (constipation.).      Allergies as of 12/17/2024 - Review Complete 12/17/2024  Allergen Reaction Noted   Sulfa antibiotics Other (See Comments) 12/15/2019   Sulfa drugs cross reactors Other (See Comments) 10/16/2011    Family  History  Problem Relation Age of Onset   Diabetes Mother    Peripheral vascular disease Mother        amputation   CAD Father    Heart disease Father    Hypertension Father    Heart attack Father    CAD Sister    Cancer Brother        x 2 brothers, type unknown   Breast cancer Sister     Social History   Socioeconomic History   Marital status: Widowed    Spouse name: Not on file   Number of children: 4   Years of education: Not on file  Highest education level: Not on file  Occupational History   Occupation: retired  Tobacco Use   Smoking status: Former    Current packs/day: 0.00    Average packs/day: 1 pack/day for 20.0 years (20.0 ttl pk-yrs)    Types: Cigarettes    Start date: 68    Quit date: 1985    Years since quitting: 41.0   Smokeless tobacco: Never  Vaping Use   Vaping status: Never Used  Substance and Sexual Activity   Alcohol  use: No    Alcohol /week: 0.0 standard drinks of alcohol    Drug use: No   Sexual activity: Not Currently  Other Topics Concern   Not on file  Social History Narrative   Not on file   Social Drivers of Health   Tobacco Use: Medium Risk (11/25/2024)   Patient History    Smoking Tobacco Use: Former    Smokeless Tobacco Use: Never    Passive Exposure: Not on Actuary Strain: Not on file  Food Insecurity: No Food Insecurity (06/21/2024)   Epic    Worried About Programme Researcher, Broadcasting/film/video in the Last Year: Never true    Ran Out of Food in the Last Year: Never true  Transportation Needs: No Transportation Needs (06/21/2024)   Epic    Lack of Transportation (Medical): No    Lack of Transportation (Non-Medical): No  Physical Activity: Not on file  Stress: Not on file  Social Connections: Moderately Integrated (06/21/2024)   Social Connection and Isolation Panel    Frequency of Communication with Friends and Family: More than three times a week    Frequency of Social Gatherings with Friends and Family: Three times a week     Attends Religious Services: 1 to 4 times per year    Active Member of Clubs or Organizations: Yes    Attends Banker Meetings: 1 to 4 times per year    Marital Status: Widowed  Intimate Partner Violence: Not At Risk (06/21/2024)   Epic    Fear of Current or Ex-Partner: No    Emotionally Abused: No    Physically Abused: No    Sexually Abused: No  Depression (PHQ2-9): Not on file  Alcohol  Screen: Not on file  Housing: Low Risk (06/21/2024)   Epic    Unable to Pay for Housing in the Last Year: No    Number of Times Moved in the Last Year: 0    Homeless in the Last Year: No  Utilities: Not At Risk (06/21/2024)   Epic    Threatened with loss of utilities: No  Health Literacy: Not on file    Physical Exam: Vital signs in last 24 hours: Temp:  [98.2 F (36.8 C)-98.5 F (36.9 C)] 98.2 F (36.8 C) (01/16 1710) Pulse Rate:  [79-85] 85 (01/16 1710) Resp:  [20] 20 (01/16 1710) BP: (103-110)/(52-58) 104/52 (01/16 1710) SpO2:  [96 %-100 %] 100 % (01/16 1710) Weight:  [101.4 kg] 101.4 kg (01/16 1140)   General:   Alert,  Well-developed, well-nourished, pleasant and cooperative in NAD Head:  Normocephalic and atraumatic. Eyes:  Sclera clear, no icterus.   Conjunctiva pink. Ears:  Normal auditory acuity. Nose:  No deformity, discharge,  or lesions. Lungs:  Clear throughout to auscultation.   No wheezes, crackles, or rhonchi. No acute distress. Heart:  Regular rate and rhythm; no murmurs, clicks, rubs,  or gallops. Abdomen:  Soft, nontender and nondistended. No masses, hepatosplenomegaly or hernias noted. Normal bowel sounds, without guarding, and  without rebound.   Extremities:  Without clubbing or edema.  Intake/Output from previous day: No intake/output data recorded. Intake/Output this shift: Total I/O In: 100 [IV Piggyback:100] Out: -   Lab Results: Recent Labs    12/17/24 1140  WBC 6.1  HGB 10.3*  HCT 33.2*  PLT 108*   BMET Recent Labs     12/17/24 1140  NA 136  K 4.4  CL 92*  CO2 29  GLUCOSE 181*  BUN 33*  CREATININE 6.23*  CALCIUM  7.7*   LFT Recent Labs    12/17/24 1140  PROT 6.6  ALBUMIN  3.5  AST 33  ALT 13  ALKPHOS 181*  BILITOT 0.3   PT/INR No results for input(s): LABPROT, INR in the last 72 hours. Hepatitis Panel No results for input(s): HEPBSAG, HCVAB, HEPAIGM, HEPBIGM in the last 72 hours.  Studies/Results: CT ABDOMEN PELVIS WO CONTRAST Result Date: 12/17/2024 CLINICAL DATA:  Constipation and vomiting EXAM: CT ABDOMEN AND PELVIS WITHOUT CONTRAST TECHNIQUE: Multidetector CT imaging of the abdomen and pelvis was performed following the standard protocol without IV contrast. RADIATION DOSE REDUCTION: This exam was performed according to the departmental dose-optimization program which includes automated exposure control, adjustment of the mA and/or kV according to patient size and/or use of iterative reconstruction technique. COMPARISON:  Same-day abdominal radiograph, CT abdomen and pelvis dated 11/25/2024 FINDINGS: Lower chest: No focal consolidation or pulmonary nodule in the lung bases. Trace bilateral pleural effusions. Multichamber cardiomegaly. Coronary artery calcifications. Hepatobiliary: No focal hepatic lesions. No intra or extrahepatic biliary ductal dilation. Cholelithiasis. Pancreas: No focal lesions or main ductal dilation. Spleen: Normal in size without focal abnormality. Adrenals/Urinary Tract: No adrenal nodules. Atrophic kidneys containing multifocal bilateral cysts, largest arising exophytically from the left interpolar kidney measuring 9.8 cm (4:42). No suspicious renal mass, calculi or hydronephrosis. Decompressed urinary bladder. Stomach/Bowel: Partially imaged mildly patulous lower esophagus containing a few nodular densities measuring up to 8 mm (4:17). Normal appearance of the stomach. No evidence of bowel wall thickening, distention, or inflammatory changes. Moderate volume  stool within the rectum. Sigmoid colon is located within the right lower quadrant. Small volume stool within the upstream colon. Appy normal appendix. Appendix is not discretely seen. Vascular/Lymphatic: Aortic atherosclerosis. No enlarged abdominal or pelvic lymph nodes. Reproductive: No adnexal masses. Other: No free fluid, fluid collection, or free air. Musculoskeletal: Increased height loss of L5 compression fracture. Unchanged subtle rounded sclerotic focus within the left iliac (4:70). Multilevel degenerative changes of the partially imaged thoracic and lumbar spine. Left lateral flank subcutaneous soft tissue edema. IMPRESSION: 1. Moderate volume stool within the rectum may represent fecal impaction. Small volume stool within the upstream colon. No abnormal bowel dilation. 2. Partially imaged mildly patulous lower esophagus containing a few nodular densities measuring up to 8 mm. Consider correlation with endoscopy. 3. Increased height loss of L5 compression fracture. 4. Trace bilateral pleural effusions. 5.  Aortic Atherosclerosis (ICD10-I70.0). Electronically Signed   By: Limin  Xu M.D.   On: 12/17/2024 16:29   DG Abdomen 1 View Result Date: 12/17/2024 CLINICAL DATA:  Constipation, vomiting EXAM: ABDOMEN - 1 VIEW COMPARISON:  November 25, 2024 FINDINGS: The bowel gas pattern is normal. Mild amount of stool is seen in the colon and rectum. No radio-opaque calculi or other significant radiographic abnormality are seen. IMPRESSION: No abnormal bowel dilatation. Mild stool burden. Electronically Signed   By: Lynwood Landy Raddle M.D.   On: 12/17/2024 12:46   DG Chest 2 View Result Date: 12/17/2024 CLINICAL DATA:  Cough EXAM: CHEST - 2 VIEW COMPARISON:  November 25, 2024 FINDINGS: Stable cardiomegaly. Possible left retrocardiac opacity concerning for atelectasis or infiltrate and possible effusion. Bony thorax is unremarkable. IMPRESSION: Possible left retrocardiac opacity as described above. Electronically  Signed   By: Lynwood Landy Raddle M.D.   On: 12/17/2024 12:45    Assessment/Plan:   79 y.o. female with history of A-fib on Eliquis , ESRD on dialysis Tuesday Thursday Saturday, CAD, and hypertension, COPD on 1 L nasal cannula at baseline, CHF, presenting with constipation and vomiting.  Also states that she has had a cough that is chronic.  Does note intermittent shortness of breath, feels short of breath at this time.  States that she had a flu or COVID swab done yesterday that was negative.  States that she felt unwell and did not go to dialysis.  States that she has crampy abdominal pain diffusely. Per independent history from EMS, she has been having a cough.  Is on her home O2 at this time.  Her last dialysis was Tuesday since she missed her dialysis yesterday.  ESRD:Will dialyze for 2.5 hrs with fluid removal as tolerated.  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 Will continue to monitor closely.    LOS: 0 Pinkey Edman, MD [facility] @TODAY @6 :03 PM

## 2024-12-17 NOTE — ED Provider Notes (Signed)
" °  Physical Exam  BP (!) 104/52 (BP Location: Right Arm)   Pulse 85   Temp 98.2 F (36.8 C) (Oral)   Resp 20   Wt 101.4 kg   SpO2 100%   BMI 40.89 kg/m   Physical Exam  Procedures  Procedures  ED Course / MDM   Clinical Course as of 12/17/24 1739  Fri Dec 17, 2024  1323 DG Abdomen 1 View No abnormal bowel dilatation. Mild stool burden.  [TT]  1323 DG Chest 2 View Possible left retrocardiac opacity as described above.  [TT]  1323 Independent review of labs, she is fluid positive, electrolytes not severely deranged, creatinine is elevated but she has history of ESRD on dialysis, no leukocytosis [TT]  1400 Will start her on antibiotics for pneumonia. [TT]  1400 Influenza A By PCR(!): POSITIVE Unclear when symptoms started. [TT]  1453 BNP is elevated, troponin is elevated but history of ESRD on dialysis, she denies chest pain at this time, suspect demand or poor clearance. [TT]  1533 61F ESRD on dialysis, flu+, COPD exacebation, pna - trop elevated, suspect in setting of ESRD, trending - shob, cough primary complaints - cxr w/ superimposed pna - getting abx - ?concern for abdominal pathology at facility. Constipation x2 wks. Some distension, no guarding.   TO DO: - f/u CT - admit [MM]  1650 CTAP: IMPRESSION: 1. Moderate volume stool within the rectum may represent fecal impaction. Small volume stool within the upstream colon. No abnormal bowel dilation. 2. Partially imaged mildly patulous lower esophagus containing a few nodular densities measuring up to 8 mm. Consider correlation with endoscopy. 3. Increased height loss of L5 compression fracture. 4. Trace bilateral pleural effusions. 5.  Aortic Atherosclerosis (ICD10-I70.0).   [MM]  1724 Patient reevaluated, updated on findings.  Does endorse increasing shortness of breath.  Fortunately normal work of breathing on 2 L nasal cannula here, usually on 1 L nasal cannula.  Repeat lung exam notably improved, no  significant wheezing  Given multiple comorbidities, recently missed dialysis, concern for superimposed pneumonia on top of influenza A in this elderly patient and notable troponin elevation which I believe is a type II NSTEMI in the setting of influenza and ESRD (no chest pain, nonischemic EKG),  I do believe patient is appropriate  Hospitalist consult order placed [MM]  1727 Ecg = A-fib, rate 86, no gross ST elevation or depression, no significant repolarization abnormality, normal axis, normal intervals.  No evidence of ischemia no arrhythmia my interpretation. [MM]  1739 Presented to hospitalist for admission [MM]    Clinical Course User Index [MM] Clarine Ozell LABOR, MD [TT] Waymond Lorelle Cummins, MD   Medical Decision Making Amount and/or Complexity of Data Reviewed Labs:  Decision-making details documented in ED Course. Radiology: ordered. Decision-making details documented in ED Course.  Risk OTC drugs. Prescription drug management. Decision regarding hospitalization.          Clarine Ozell LABOR, MD 12/17/24 1739  "

## 2024-12-17 NOTE — ED Notes (Signed)
 Pharmacy called for Doxycycline .

## 2024-12-17 NOTE — ED Notes (Signed)
 Dr. Waymond aware of Troponin of 165.

## 2024-12-17 NOTE — ED Provider Notes (Addendum)
 Jacqueline [name] Provider Note    Event Date/Time   First MD Initiated Contact with Patient 12/17/24 1318     (approximate)   History   No chief complaint on file.   HPI  Jacqueline [name] is a 79 y.o. female with history of A-fib on Eliquis , ESRD on dialysis Tuesday Thursday Saturday, CAD, and hypertension, COPD on 1 L nasal cannula at baseline, CHF, presenting with constipation and vomiting.  Also states that she has had a cough that is chronic.  Does note intermittent shortness of breath, feels short of breath at this time.  States that her pain her treatments as well as her laxatives.  States that she was told that her bowel is twisted and so was sent here for evaluation.  States that she had a flu or COVID swab done yesterday that was negative.  States that she felt unwell and did not go to dialysis.  Unable to tell me what symptoms have acutely occurred in the last several days.  No fever at home.  No chest pain.  States that she is passing gas.  Last bowel movement was 1 to 2 weeks ago.  No nausea at this time.  States that she has crampy abdominal pain diffusely.  Per independent history from EMS, she has been having a cough.  Is on her home O2 at this time.  Her last dialysis was Tuesday since she missed her dialysis yesterday.  On independent review, patient was seen by nephrology in July, was continued on her dialysis outpatient, was seen by pulmonology as well, has a cough that with improvement with the nebulizer treatment.     Physical Exam   Triage Vital Signs: ED Triage Vitals  Encounter Vitals Group     BP 12/17/24 1142 (!) 110/55     Girls Systolic BP Percentile --      Girls Diastolic BP Percentile --      Boys Systolic BP Percentile --      Boys Diastolic BP Percentile --      Pulse Rate 12/17/24 1142 79     Resp 12/17/24 1142 20     Temp 12/17/24 1142 98.5 F (36.9 C)     Temp Source 12/17/24 1142 Oral     SpO2 12/17/24 1142 96 %      Weight 12/17/24 1140 223 lb 8.7 oz (101.4 kg)     Height --      Head Circumference --      Peak Flow --      Pain Score 12/17/24 1141 5     Pain Loc --      Pain Education --      Exclude from Growth Chart --     Most recent vital signs: Vitals:   12/17/24 1142  BP: (!) 110/55  Pulse: 79  Resp: 20  Temp: 98.5 F (36.9 C)  SpO2: 96%     General: Awake, no distress.  CV:  Good peripheral perfusion.  Resp:  Normal effort.  Wheezing diffusely Abd:  No distention.  Soft, mildly tender generally, no guarding Other:  No unilateral calf swelling or tenderness   ED Results / Procedures / Treatments   Labs (all labs ordered are listed, but only abnormal results are displayed) Labs Reviewed  RESP PANEL BY RT-PCR (RSV, FLU A&B, COVID)  RVPGX2 - Abnormal; Notable for the following components:      Result Value   Influenza A by PCR POSITIVE (*)  All other components within normal limits  CBC WITH DIFFERENTIAL/PLATELET - Abnormal; Notable for the following components:   RBC 3.21 (*)    Hemoglobin 10.3 (*)    HCT 33.2 (*)    MCV 103.4 (*)    RDW 18.8 (*)    Platelets 108 (*)    Lymphs Abs 0.2 (*)    All other components within normal limits  COMPREHENSIVE METABOLIC PANEL WITH GFR - Abnormal; Notable for the following components:   Chloride 92 (*)    Glucose, Bld 181 (*)    BUN 33 (*)    Creatinine, Ser 6.23 (*)    Calcium  7.7 (*)    Alkaline Phosphatase 181 (*)    GFR, Estimated 6 (*)    All other components within normal limits  PRO BRAIN NATRIURETIC PEPTIDE - Abnormal; Notable for the following components:   Pro Brain Natriuretic Peptide >35,000.0 (*)    All other components within normal limits  TROPONIN T, HIGH SENSITIVITY - Abnormal; Notable for the following components:   Troponin T High Sensitivity 165 (*)    All other components within normal limits  TROPONIN T, HIGH SENSITIVITY     RADIOLOGY On my independent interpretation, chest x-ray shows left-sided  opacity.   PROCEDURES:  Critical Care performed: No  Procedures   MEDICATIONS ORDERED IN ED: Medications  doxycycline  (VIBRAMYCIN ) 100 mg in sodium chloride  0.9 % 250 mL IVPB (has no administration in time range)  ipratropium-albuterol  (DUONEB) 0.5-2.5 (3) MG/3ML nebulizer solution 9 mL (9 mLs Nebulization Given 12/17/24 1439)  methylPREDNISolone  sodium succinate  (SOLU-MEDROL ) 125 mg/2 mL injection 125 mg (125 mg Intravenous Given 12/17/24 1432)  cefTRIAXone  (ROCEPHIN ) 1 g in sodium chloride  0.9 % 100 mL IVPB (1 g Intravenous New Bag/Given 12/17/24 1439)     IMPRESSION / MDM / ASSESSMENT AND PLAN / ED COURSE  I reviewed the triage vital signs and the nursing notes.                              Differential diagnosis includes, but is not limited to, COPD exacerbation, CHF, electrolyte derangements, constipation, ileus, did consider SBO but patient is passing gas.  She has no significant abdominal pain, no guarding, considered but doubt perforation.  Did also consider atypical ACS, erythema.  Will get labs, EKG, troponin, chest x-ray, CT Abdo pelvis.  DuoNebs, steroids.  Patient's presentation is most consistent with acute presentation with potential threat to life or bodily function.  Independent interpretation of labs and imaging below.  Patient signed out pending CT abdomen pelvis, likely needs admission for COPD exacerbation, pneumonia, missed dialysis.  The patient is on the cardiac monitor to evaluate for evidence of arrhythmia and/or significant heart rate changes.   Clinical Course as of 12/17/24 1530  Fri Dec 17, 2024  1323 DG Abdomen 1 View No abnormal bowel dilatation. Mild stool burden.  [TT]  1323 DG Chest 2 View Possible left retrocardiac opacity as described above.  [TT]  1323 Independent review of labs, she is fluid positive, electrolytes not severely deranged, creatinine is elevated but she has history of ESRD on dialysis, no leukocytosis [TT]  1400 Will start  her on antibiotics for pneumonia. [TT]  1400 Influenza A By PCR(!): POSITIVE Unclear when symptoms started. [TT]  1453 BNP is elevated, troponin is elevated but history of ESRD on dialysis, she denies chest pain at this time, suspect demand or poor clearance. [TT]    Clinical Course  User Index [TT] Waymond Lorelle Cummins, MD     FINAL CLINICAL IMPRESSION(S) / ED DIAGNOSES   Final diagnoses:  Generalized abdominal pain  Shortness of breath  Cough, unspecified type  Influenza A  COPD exacerbation (HCC)  ESRD on dialysis [facility])     Rx / DC Orders   ED Discharge Orders     None        Note:  This document was prepared using Dragon voice recognition software and may include unintentional dictation errors.     Waymond Lorelle Cummins, MD 12/17/24 1530    Waymond Lorelle Cummins, MD 12/17/24 [phone]

## 2024-12-17 NOTE — ED Notes (Signed)
 Patient taken to CT scan.

## 2024-12-17 NOTE — H&P (Signed)
 " History and Physical    Patient: Jacqueline [name] FMW:[id number] DOB: 1946/11/12 DOA: 12/17/2024 DOS: the patient was seen and examined on 12/17/2024 PCP: Austin Candyce Stall, MD  Patient coming from: SNF - [facility]  Chief Complaint: abdominal pain, constipation, shortness of breath, cough, missed HD  HPI: Jacqueline [name] is a 79 y.o. female with medical history significant for A-fib on Eliquis , ESRD on HD (TTS), T2DM, HFpEF (LVEF 60-65%), COPD on chronic 1 L O2, anemia of CKD, HTN, CAD s/p DES to RCA (12/2016), GERD, HLD, morbid obesity who presented to the ED from [facility] on 12/18/2023 due to abdominal pain, constipation, shortness of breath, and cough, with concern for sigmoid volvulus on imaging.  Collateral obtained from staff at [facility], reported patient was complaining of significant abdominal pain with abdominal x-ray concerning for sigmoid volvulus.  They noted patient has been constipated with last documented bowel movement on 1/14 noted to be small and hard.  Further history obtained from the patient and her son, [name], over the phone, indicating the patient had been also complaining of 3 days of cough productive of clear sputum and shortness of breath, generally feeling unwell and missing her HD session on Thursday, which was deviation from her normal strict compliance with her HD sessions.   In the ED, she was afebrile with a temp of 98.5 F, HR 79, RR 20, BP 110/55, SpO2 96-100% on 2 L [place].  CBC showed no leukocytosis, Hgb 10.3, thrombocytopenia to 108.  CMP without acute abnormalities.  4 Plex PCR positive for influenza A.  proBNP elevated to >35,000.  High-sensitivity troponin 165 > 161. EKG showed A-fib with incomplete RBBB and ventricular rate of 86 without acute ST segment changes. CXR showed stable cardiomegaly with possible left retrocardiac opacity concerning for atelectasis versus infiltrate and possible effusion. Abdominal x-ray showed no abnormal bowel  dilation and mild stool burden CT abdomen pelvis without contrast showed moderate volume stool within the rectum possibly presenting fecal impaction with small volume within the upstream colon and no abnormal bowel dilation.  Noted partially imaged mildly patulous lower esophagus containing a few nodular densities measuring up to 8 mm.  Increased height loss of L5 compression fracture.  Trace bilateral pleural effusions.  The patient received methylprednisolone  125 mg, DuoNebs, IV Rocephin , and IV doxycycline .  TRH consulted for admission for shortness of breath in the setting of recently missed dialysis with influenza A and concern for superimposed pneumonia.  Review of Systems:  Review of Systems  Constitutional:  Positive for malaise/fatigue. Negative for chills, diaphoresis and fever.  HENT:  Positive for congestion. Negative for sore throat.   Respiratory:  Positive for cough, sputum production (clear) and shortness of breath.   Cardiovascular:  Negative for chest pain and palpitations.  Gastrointestinal:  Positive for abdominal pain, constipation, nausea and vomiting.  Musculoskeletal:  Positive for back pain. Negative for joint pain.  Neurological:  Negative for focal weakness and headaches.    Past Medical History:  Diagnosis Date   Arthritis    left knee (12/04/2016)   CAD (coronary artery disease)    a. 12/2016 NSTEMI/PCI: LM nl, LaD 20p/d, D1 20, OM2 20, OM3 20, RCA 85m (3.0x18 Resolute DES). EF 65%.   Chronic lower back pain    Diverticulosis    on CT   ESRD on dialysis [facility])    GERD (gastroesophageal reflux disease)    Hiatal hernia    History of echocardiogram    a. 09/2022  Echo: EF 60-65%, mild asymm LVH, mod reduced RV fxn, RVSP . Mildly dil RA. Mild AI. Ao sclerosis. Mild MR.   Hyperlipidemia    Hypertension    a. 12/2016 labetalol  d/c'd 2/2 hypotension in HD.   Morbid obesity (HCC)    Obesity    Persistent atrial fibrillation (HCC)    a. 05/2023 Zio: 100% afib  avg HR 52 (32-99).   Pneumonia    couple times (12/04/2016)   Symptomatic bradycardia 06/07/2023   Type II diabetes mellitus [facility])    Past Surgical History:  Procedure Laterality Date   A/V FISTULAGRAM N/A 11/12/2023   Procedure: A/V Fistulagram;  Surgeon: Melia Lynwood ORN, MD;  Location: [facility] INVASIVE CV LAB;  Service: Cardiovascular;  Laterality: N/A;   A/V FISTULAGRAM Left 09/29/2024   Procedure: A/V Fistulagram;  Surgeon: Pearline Norman RAMAN, MD;  Location: [facility] PV LAB;  Service: Cardiovascular;  Laterality: Left;   ABDOMINAL HYSTERECTOMY     AV FISTULA PLACEMENT Left 01/27/2014   Procedure: ARTERIOVENOUS (AV) FISTULA CREATION;  Surgeon: Lynwood JONETTA Collum, MD;  Location: [facility] OR;  Service: Vascular;  Laterality: Left;   AV FISTULA REPAIR     had it cleaned out   CARDIAC CATHETERIZATION  ?1980s   CARDIAC CATHETERIZATION N/A 12/04/2016   Procedure: Left Heart Cath Angiography;  Surgeon: Lonni JONETTA Cash, MD;  Location: [facility] INVASIVE CV LAB;  Service: Cardiovascular;  Laterality: N/A;   CARDIAC CATHETERIZATION N/A 12/04/2016   Procedure: Coronary Stent Intervention;  Surgeon: Lonni JONETTA Cash, MD;  Location: [facility] INVASIVE CV LAB;  Service: Cardiovascular;  Laterality: N/A;  Mid RCA   CATARACT EXTRACTION W/ INTRAOCULAR LENS IMPLANT Left    COLONOSCOPY W/ BIOPSIES AND POLYPECTOMY     CORONARY ANGIOPLASTY WITH STENT PLACEMENT  12/04/2016   ESOPHAGOGASTRODUODENOSCOPY     INSERTION OF DIALYSIS CATHETER N/A 01/19/2014   Procedure: INSERTION OF DIALYSIS CATHETER;  Surgeon: Krystal JULIANNA Doing, MD;  Location: [facility] OR;  Service: Vascular;  Laterality: N/A;   PERIPHERAL VASCULAR BALLOON ANGIOPLASTY  11/12/2023   Procedure: PERIPHERAL VASCULAR BALLOON ANGIOPLASTY;  Surgeon: Melia Lynwood ORN, MD;  Location: [facility] INVASIVE CV LAB;  Service: Cardiovascular;;   RIGHT/LEFT HEART CATH AND CORONARY ANGIOGRAPHY N/A 06/11/2023   Procedure: RIGHT/LEFT HEART CATH AND CORONARY ANGIOGRAPHY;  Surgeon: Mady Lonni, MD;  Location:  [facility] INVASIVE CV LAB;  Service: Cardiovascular;  Laterality: N/A;   VENOUS ANGIOPLASTY  09/29/2024   Procedure: VENOUS ANGIOPLASTY;  Surgeon: Pearline Norman RAMAN, MD;  Location: [facility] PV LAB;  Service: Cardiovascular;;  cephalic arch   Social History:  reports that she quit smoking about 41 years ago. Her smoking use included cigarettes. She started smoking about 61 years ago. She has a 20 pack-year smoking history. She has never used smokeless tobacco. She reports that she does not drink alcohol  and does not use drugs.  Allergies[1]  Family History  Problem Relation Age of Onset   Diabetes Mother    Peripheral vascular disease Mother        amputation   CAD Father    Heart disease Father    Hypertension Father    Heart attack Father    CAD Sister    Cancer Brother        x 2 brothers, type unknown   Breast cancer Sister     Prior to Admission medications  Medication Sig Start Date End Date Taking? Authorizing Provider  acetaminophen  (TYLENOL ) 650 MG CR tablet Take 650 mg by mouth every 8 (eight) hours. (0600, 1400 &  2200)    [provider]  albuterol  (PROVENTIL ) (2.5 MG/3ML) 0.083% nebulizer solution Take 2.5 mg by nebulization every 6 (six) hours as needed for wheezing or shortness of breath.    [provider]  allopurinol  (ZYLOPRIM ) 100 MG tablet Take 100 mg by mouth daily.    [provider]  aluminum -magnesium  hydroxide-simethicone  (MAALOX) 200-200-20 MG/5ML SUSP Take 30 mLs by mouth 4 (four) times daily -  before meals and at bedtime. 07/20/23   Viviann Pastor, MD  apixaban  (ELIQUIS ) 2.5 MG TABS tablet Take 1 tablet (2.5 mg total) by mouth 2 (two) times daily. 06/25/24   Arlon Carliss ORN, DO  benzonatate  (TESSALON ) 100 MG capsule Take 1 capsule (100 mg total) by mouth 3 (three) times daily. 06/25/24   Arlon Carliss ORN, DO  bisacodyl  (DULCOLAX) 10 MG suppository Place 10 mg rectally daily as needed for moderate constipation.    [provider]   cinacalcet  (SENSIPAR ) 30 MG tablet Take 30 mg by mouth daily with supper. (1800)    [provider]  clotrimazole-betamethasone (LOTRISONE) cream Apply 1 Application topically See admin instructions. Apply as needed to affected area of ear for itching/irritation.    [provider]  dextromethorphan -guaiFENesin  (MUCINEX  DM) 30-600 MG 12hr tablet Take 1 tablet by mouth 2 (two) times daily. (0900 & 1800)    [provider]  Droxidopa  200 MG CAPS Take 200 mg by mouth with breakfast, with lunch, and with evening meal. 01/27/24   Malvina Alm DASEN, MD  ezetimibe  (ZETIA ) 10 MG tablet Take 1 tablet (10 mg total) by mouth daily. 04/30/22   Darron Deatrice LABOR, MD  ferric citrate  (AURYXIA ) 1 GM 210 MG(Fe) tablet Take 420 mg by mouth 3 (three) times daily with meals. Patient not taking: Reported on 09/17/2024    [provider]  fexofenadine (ALLEGRA) 180 MG tablet Take 180 mg by mouth in the morning. (0800)    [provider]  fludrocortisone  (FLORINEF ) 0.1 MG tablet Take 2 tablets (0.2 mg total) by mouth 2 (two) times daily. 07/01/23   Fausto Burnard LABOR, DO  fluticasone (FLONASE) 50 MCG/ACT nasal spray Place 1 spray into both nostrils in the morning. (0900)    [provider]  guaiFENesin -dextromethorphan  (ROBITUSSIN DM) 100-10 MG/5ML syrup Take 15 mLs by mouth every 4 (four) hours as needed for cough. 07/01/23   Fausto Burnard LABOR, DO  HYDROcodone -acetaminophen  (NORCO/VICODIN) 5-325 MG tablet Take 1-2 tablets by mouth every 6 (six) hours as needed for moderate pain. 07/01/23   Fausto Burnard A, DO  insulin  aspart (NOVOLOG ) 100 UNIT/ML injection Inject 0-20 Units into the skin 3 (three) times daily with meals. For CBG 70-120: 0 units For CBG 121-150: 3 units For CBG 151-200: 4 units For CBG 201-250: 7 units For CBG 251-300: 11 units For CBG 301-350: 15 units For CBG 351-400: 20 units For CBG > 400: 25 units and call your doctor 07/01/23   Fausto Burnard A, DO   insulin  glargine-yfgn (SEMGLEE ) 100 UNIT/ML injection Inject 0.05 mLs (5 Units total) into the skin daily. 07/02/23   Fausto Burnard LABOR, DO  ipratropium-albuterol  (DUONEB) 0.5-2.5 (3) MG/3ML SOLN Take 3 mLs by nebulization 2 (two) times daily. 06/25/24   Arlon Carliss ORN, DO  Lidocaine  HCl (ASPERCREME LIDOCAINE ) 4 % CREA Apply 1 Application topically in the morning and at bedtime. Applied to bilateral knees    [provider]  magnesium  hydroxide (MILK OF MAGNESIA) 400 MG/5ML suspension Take 30 mLs by mouth daily as needed (if  no BM in 3 days.).    [provider]  Menthol -Zinc  Oxide (MOISTURE BARRIER EX) Apply 1 Application topically as directed. Apply barrier cream to buttocks every shift    [provider]  midodrine  (PROAMATINE ) 10 MG tablet Take 1 tablet (10 mg total) by mouth 3 (three) times daily with meals. 06/09/23   Shellia Inge BIRCH, NP  midodrine  (PROAMATINE ) 10 MG tablet Take 20 mg by mouth as needed (for systolic BP less than 100.).    [provider]  Multiple Vitamin (MULTIVITAMIN WITH MINERALS) TABS tablet Take 1 tablet by mouth at bedtime.    [provider]  naloxone  (NARCAN ) nasal spray 4 mg/0.1 mL Place 1 spray into the nose 3 (three) times daily as needed (signs and symptoms of opioid overdose).    [provider]  nitroGLYCERIN  (NITROSTAT ) 0.4 MG SL tablet Place 1 tablet (0.4 mg total) under the tongue every 5 (five) minutes as needed for chest pain. 07/01/23   Fausto Sor A, DO  ondansetron  (ZOFRAN -ODT) 4 MG disintegrating tablet Take 4 mg by mouth every 8 (eight) hours as needed for nausea or vomiting.    [provider]  OXYGEN  Inhale 2 L/min into the lungs as needed (shortness of breath or decreased O2).    [provider]  pantoprazole  (PROTONIX ) 40 MG tablet Take 1 tablet (40 mg total) by mouth daily. 07/02/23   Fausto Sor LABOR, DO  polyethylene glycol (MIRALAX  / GLYCOLAX ) 17 g packet Take 17 g by  mouth daily as needed for moderate constipation. 06/09/23   Keene, Jeremiah D, NP  polyvinyl alcohol  (LIQUIFILM TEARS) 1.4 % ophthalmic solution Place 1 drop into both eyes as needed for dry eyes. 07/01/23   Fausto Sor LABOR, DO  rosuvastatin  (CRESTOR ) 10 MG tablet Take 1 tablet (10 mg total) by mouth daily. 10/03/22   Jhonny Calvin NOVAK, MD  sodium phosphate (FLEET) ENEM Place 1 enema rectally daily as needed for severe constipation (if no BM results in 12 hours from dulcolax suppository).    [provider]  sorbitol  70 % solution Take 30 mLs by mouth daily as needed (constipation.).    [provider]    Physical Exam: Vitals:   12/17/24 1140 12/17/24 1142 12/17/24 1705 12/17/24 1710  BP:  (!) 110/55 (!) 103/58 (!) 104/52  Pulse:  79  85  Resp:  20  20  Temp:  98.5 F (36.9 C)  98.2 F (36.8 C)  TempSrc:  Oral  Oral  SpO2:  96%  100%  Weight: 101.4 kg      Gen: elderly female, NAD, A&Ox3 HEENT: NCAT, EOMI Neck: Supple CV: RRR, no murmurs Resp: normal WOB, bilateral diffuse end-expiratory wheezing and bibasilar crackles Abd: Soft, generalized tenderness to palpation, nondistended, no guarding, BS normoactive Ext: 1+ LE edema, pulses 2+ b/l, LUE AVF Skin: Warm, dry Neuro: No focal deficits Psych: Calm, cooperative, appropriate affect   Labs on Admission: I have personally reviewed following labs and imaging studies  CBC: Recent Labs  Lab 12/17/24 1140  WBC 6.1  NEUTROABS 5.6  HGB 10.3*  HCT 33.2*  MCV 103.4*  PLT 108*   Basic Metabolic Panel: Recent Labs  Lab 12/17/24 1140  NA 136  K 4.4  CL 92*  CO2 29  GLUCOSE 181*  BUN 33*  CREATININE 6.23*  CALCIUM  7.7*   GFR: Estimated Creatinine Clearance: 7.9 mL/min (A) (by C-G formula based on SCr of 6.23 mg/dL (H)). Liver Function Tests: Recent Labs  Lab  12/17/24 1140  AST 33  ALT 13  ALKPHOS 181*  BILITOT 0.3  PROT 6.6  ALBUMIN  3.5   No results for input(s): LIPASE, AMYLASE in the  last 168 hours. No results for input(s): AMMONIA in the last 168 hours. Coagulation Profile: No results for input(s): INR, PROTIME in the last 168 hours. Cardiac Enzymes: No results for input(s): CKTOTAL, CKMB, CKMBINDEX, TROPONINI in the last 168 hours. BNP (last 3 results) Recent Labs    12/17/24 1148  PROBNP >35,000.0*   HbA1C: No results for input(s): HGBA1C in the last 72 hours. CBG: No results for input(s): GLUCAP in the last 168 hours. Lipid Profile: No results for input(s): CHOL, HDL, LDLCALC, TRIG, CHOLHDL, LDLDIRECT in the last 72 hours. Thyroid  Function Tests: No results for input(s): TSH, T4TOTAL, FREET4, T3FREE, THYROIDAB in the last 72 hours. Anemia Panel: No results for input(s): VITAMINB12, FOLATE, FERRITIN, TIBC, IRON , RETICCTPCT in the last 72 hours. Urine analysis:    Component Value Date/Time   COLORURINE YELLOW 01/25/2014 1903   APPEARANCEUR TURBID (A) 01/25/2014 1903   LABSPEC 1.020 01/25/2014 1903   PHURINE 5.0 01/25/2014 1903   GLUCOSEU NEGATIVE 01/25/2014 1903   HGBUR LARGE (A) 01/25/2014 1903   BILIRUBINUR SMALL (A) 01/25/2014 1903   KETONESUR NEGATIVE 01/25/2014 1903   PROTEINUR 100 (A) 01/25/2014 1903   UROBILINOGEN 1.0 01/25/2014 1903   NITRITE NEGATIVE 01/25/2014 1903   LEUKOCYTESUR LARGE (A) 01/25/2014 1903    Radiological Exams on Admission: CT ABDOMEN PELVIS WO CONTRAST Result Date: 12/17/2024 CLINICAL DATA:  Constipation and vomiting EXAM: CT ABDOMEN AND PELVIS WITHOUT CONTRAST TECHNIQUE: Multidetector CT imaging of the abdomen and pelvis was performed following the standard protocol without IV contrast. RADIATION DOSE REDUCTION: This exam was performed according to the departmental dose-optimization program which includes automated exposure control, adjustment of the mA and/or kV according to patient size and/or use of iterative reconstruction technique. COMPARISON:  Same-day abdominal  radiograph, CT abdomen and pelvis dated 11/25/2024 FINDINGS: Lower chest: No focal consolidation or pulmonary nodule in the lung bases. Trace bilateral pleural effusions. Multichamber cardiomegaly. Coronary artery calcifications. Hepatobiliary: No focal hepatic lesions. No intra or extrahepatic biliary ductal dilation. Cholelithiasis. Pancreas: No focal lesions or main ductal dilation. Spleen: Normal in size without focal abnormality. Adrenals/Urinary Tract: No adrenal nodules. Atrophic kidneys containing multifocal bilateral cysts, largest arising exophytically from the left interpolar kidney measuring 9.8 cm (4:42). No suspicious renal mass, calculi or hydronephrosis. Decompressed urinary bladder. Stomach/Bowel: Partially imaged mildly patulous lower esophagus containing a few nodular densities measuring up to 8 mm (4:17). Normal appearance of the stomach. No evidence of bowel wall thickening, distention, or inflammatory changes. Moderate volume stool within the rectum. Sigmoid colon is located within the right lower quadrant. Small volume stool within the upstream colon. Appy normal appendix. Appendix is not discretely seen. Vascular/Lymphatic: Aortic atherosclerosis. No enlarged abdominal or pelvic lymph nodes. Reproductive: No adnexal masses. Other: No free fluid, fluid collection, or free air. Musculoskeletal: Increased height loss of L5 compression fracture. Unchanged subtle rounded sclerotic focus within the left iliac (4:70). Multilevel degenerative changes of the partially imaged thoracic and lumbar spine. Left lateral flank subcutaneous soft tissue edema. IMPRESSION: 1. Moderate volume stool within the rectum may represent fecal impaction. Small volume stool within the upstream colon. No abnormal bowel dilation. 2. Partially imaged mildly patulous lower esophagus containing a few nodular densities measuring up to 8 mm. Consider correlation with endoscopy. 3. Increased height loss of L5 compression  fracture. 4. Trace bilateral pleural  effusions. 5.  Aortic Atherosclerosis (ICD10-I70.0). Electronically Signed   By: Limin  Xu M.D.   On: 12/17/2024 16:29   DG Abdomen 1 View Result Date: 12/17/2024 CLINICAL DATA:  Constipation, vomiting EXAM: ABDOMEN - 1 VIEW COMPARISON:  November 25, 2024 FINDINGS: The bowel gas pattern is normal. Mild amount of stool is seen in the colon and rectum. No radio-opaque calculi or other significant radiographic abnormality are seen. IMPRESSION: No abnormal bowel dilatation. Mild stool burden. Electronically Signed   By: Lynwood Landy Raddle M.D.   On: 12/17/2024 12:46   DG Chest 2 View Result Date: 12/17/2024 CLINICAL DATA:  Cough EXAM: CHEST - 2 VIEW COMPARISON:  November 25, 2024 FINDINGS: Stable cardiomegaly. Possible left retrocardiac opacity concerning for atelectasis or infiltrate and possible effusion. Bony thorax is unremarkable. IMPRESSION: Possible left retrocardiac opacity as described above. Electronically Signed   By: Lynwood Landy Raddle M.D.   On: 12/17/2024 12:45   Data Reviewed for HPI: Relevant notes from primary care and specialist visits, past discharge summaries as available in EHR, including Care Everywhere. Prior diagnostic testing as pertinent to current admission diagnoses Updated medications and problem lists for reconciliation ED course, including vitals, labs, imaging, treatment and response to treatment Triage notes, nursing and pharmacy notes and ED provider's notes Notable results as noted above in HPI   Assessment and Plan:  Influenza A infection with possible superimposed pneumonia Acute exacerbation of COPD - Presenting with 2-3 days of cough productive of clear sputum and increased SOB - No leukocytosis, afebrile. End expiratory wheezing noted on exam bilaterally - Procalcitonin ordered - CXR showed possible left retrocardiac opacity concerning for atelectasis versus infiltrate  - IV Rocephin  and PO doxycycline  for 5 days - Tamiflu   30 mg every other day for 3 days - Received IV Solumedrol in the ED. Start prednisone  40 mg for 5 days tomorrow - PRN DuoNebs - Continue home Anoro Ellipta  - Flutter valve   Constipation  Fecal impaction Diffuse abdominal pain  Nausea/Vomiting - Presented with concern for sigmoid volvulus on outside KUB. Patient had been complaining of diffuse abdominal pain, with nausea and inability to keep down PO intake, sensation of fullness, and no bowel movements for several days. She is passing gas. Per [facility] staff, last recorded BM on 1/14 noted to be small and hard - Suspect abdominal pain and N/V secondary to fecal impaction/constipation - Suspect constipation secondary to opioids and PO Auryxia   - CT abdomen pelvis showed no evidence of volvulus, noted moderate volume stool within rectum and small volume stool within upstream colon - Per [facility] staff, she is on Lactulose  20 g BID, senna x2 nightly, and PRN sorbitol  - Mineral oil enema ordered in ED - Lactulose  20 g TID, Senna BID, PRN Dulcolax suppository - Consider GoLytely  if no significant relief  Chronic hypoxic respiratory failure - Uses 1L [place] at home (order appears to be for 2 L/min) - Placed on 2L [place] with SpO2 96-100% and no documented desaturation, do not suspect acute hypoxia at this time - Wean down to baseline O2 of 1L - Goal SpO2 88-92%  Volume overload Bilateral pleural effusions, trace - ProBNP elevated to > 35,000 - CT abd/pelvis showed trace bilateral pleural effusions - Management with HD  ESRD on HD - TTS schedule, last HD on Tuesday, missed Thursday session - Nephrology consulted for HD, Dr. Dominica aware of patient  Type II MI - hsTroponins elevated to 165 > 161 - Denies chest pain - EKG without  acute ST-segment changes - Likely secondary to demand ischemia given influenza and possible superimposed pneumonia in the setting of ESRD   L5 compression fracture - Previously noted on CT abd/pelvis  in 11/2024 - CT abd/pelvis (1/16) showed increased height loss of L5 compression fracture - Likely contributing referred pain to abdominal area - PRN oxycodone  - Lidocaine  patch - Would benefit from outpatient referral to spine and consideration of kyphoplasty if indicated  Patulous esophagus - CT abdomen/pelvis showed mildly patulous lower esophagus containing a few nodular densities measuring up to 8 mm with recommendation for correlation with endoscopy - Spoke to Dr. Therisa who indicated this is likely retained food particles.  Indicated could consider endoscopy on Monday after resolution of constipation  Thrombocytopenia - Unclear etiology - Monitor  T2DM - Hgb A1c (06/2024) 6.0 - Repeat A1c ordered - On SSI only at facility, reportedly refuses correctional insulin  often if BG < 200 - Start SSI 0 - 20 AC - ACHS Accu Checks  Permanent AFib - Currently rate-controlled - Not on rate control agents - Continue Eliquis  2.5 mg twice daily  Chronic hypotension - Continue home Florinef  0.2 mg twice daily, midodrine  10 mg 3 times daily, droxidopa  200 mg 3 times daily  GERD - Continue Protonix  40 mg daily  Gout - Continue home allopurinol  100 mg daily  HLD - Continue home Crestor  10 mg daily  CAD s/p DES to RCA - Continue Eliquis  and Crestor   Morbid Obesity (BMI Body mass index is 40.89 kg/m. kg/m) - Obesity is clinically significant and complicates care - Recommend continued discussion with PCP re: lifestyle modification including dietary changes, regular physical activity, and weight reduction strategies.   DVT prophylaxis: heparin  injection 5,000 Units Start: 12/17/24 2200   Consults: Nephrology  Advance Care Planning:   Code Status: Full Code   Family Communication: Discussed with patient's son Darin at bedside and [name] (POA) over the phone  Disposition Plan: Back to [facility] pending clinical improvement  Severity of Illness: The appropriate patient  status for this patient is OBSERVATION. Observation status is judged to be reasonable and necessary in order to provide the required intensity of service to ensure the patient's safety. The patient's presenting symptoms, physical exam findings, and initial radiographic and laboratory data in the context of their medical condition is felt to place them at decreased risk for further clinical deterioration. Furthermore, it is anticipated that the patient will be medically stable for discharge from the hospital within 2 midnights of admission.   Author: Duffy Larch, MD 12/17/2024 6:09 PM  For on call review [email or web address].       [1]  Allergies Allergen Reactions   Sulfa Antibiotics Other (See Comments)   Sulfa Drugs Cross Reactors Other (See Comments)    Doesn't remember    "

## 2024-12-18 ENCOUNTER — Other Ambulatory Visit: Payer: Self-pay

## 2024-12-18 DIAGNOSIS — Z794 Long term (current) use of insulin: Secondary | ICD-10-CM | POA: Diagnosis not present

## 2024-12-18 DIAGNOSIS — R0602 Shortness of breath: Secondary | ICD-10-CM | POA: Diagnosis present

## 2024-12-18 DIAGNOSIS — Z6841 Body Mass Index (BMI) 40.0 and over, adult: Secondary | ICD-10-CM | POA: Diagnosis not present

## 2024-12-18 DIAGNOSIS — Z7901 Long term (current) use of anticoagulants: Secondary | ICD-10-CM | POA: Diagnosis not present

## 2024-12-18 DIAGNOSIS — I132 Hypertensive heart and chronic kidney disease with heart failure and with stage 5 chronic kidney disease, or end stage renal disease: Secondary | ICD-10-CM | POA: Diagnosis present

## 2024-12-18 DIAGNOSIS — J101 Influenza due to other identified influenza virus with other respiratory manifestations: Secondary | ICD-10-CM | POA: Diagnosis not present

## 2024-12-18 DIAGNOSIS — K222 Esophageal obstruction: Secondary | ICD-10-CM | POA: Diagnosis not present

## 2024-12-18 DIAGNOSIS — G9341 Metabolic encephalopathy: Secondary | ICD-10-CM | POA: Diagnosis present

## 2024-12-18 DIAGNOSIS — I9589 Other hypotension: Secondary | ICD-10-CM | POA: Diagnosis not present

## 2024-12-18 DIAGNOSIS — K5903 Drug induced constipation: Secondary | ICD-10-CM | POA: Diagnosis not present

## 2024-12-18 DIAGNOSIS — D696 Thrombocytopenia, unspecified: Secondary | ICD-10-CM | POA: Diagnosis present

## 2024-12-18 DIAGNOSIS — K219 Gastro-esophageal reflux disease without esophagitis: Secondary | ICD-10-CM | POA: Diagnosis not present

## 2024-12-18 DIAGNOSIS — M4856XA Collapsed vertebra, not elsewhere classified, lumbar region, initial encounter for fracture: Secondary | ICD-10-CM | POA: Diagnosis present

## 2024-12-18 DIAGNOSIS — J189 Pneumonia, unspecified organism: Secondary | ICD-10-CM | POA: Diagnosis not present

## 2024-12-18 DIAGNOSIS — N186 End stage renal disease: Secondary | ICD-10-CM | POA: Diagnosis present

## 2024-12-18 DIAGNOSIS — N2581 Secondary hyperparathyroidism of renal origin: Secondary | ICD-10-CM | POA: Diagnosis present

## 2024-12-18 DIAGNOSIS — J9611 Chronic respiratory failure with hypoxia: Secondary | ICD-10-CM | POA: Diagnosis not present

## 2024-12-18 DIAGNOSIS — J159 Unspecified bacterial pneumonia: Secondary | ICD-10-CM | POA: Diagnosis present

## 2024-12-18 DIAGNOSIS — Z992 Dependence on renal dialysis: Secondary | ICD-10-CM | POA: Diagnosis not present

## 2024-12-18 DIAGNOSIS — E1122 Type 2 diabetes mellitus with diabetic chronic kidney disease: Secondary | ICD-10-CM | POA: Diagnosis present

## 2024-12-18 DIAGNOSIS — J9621 Acute and chronic respiratory failure with hypoxia: Secondary | ICD-10-CM | POA: Diagnosis present

## 2024-12-18 DIAGNOSIS — J440 Chronic obstructive pulmonary disease with acute lower respiratory infection: Secondary | ICD-10-CM | POA: Diagnosis present

## 2024-12-18 DIAGNOSIS — J1008 Influenza due to other identified influenza virus with other specified pneumonia: Secondary | ICD-10-CM | POA: Diagnosis present

## 2024-12-18 DIAGNOSIS — Z1152 Encounter for screening for COVID-19: Secondary | ICD-10-CM | POA: Diagnosis not present

## 2024-12-18 DIAGNOSIS — K224 Dyskinesia of esophagus: Secondary | ICD-10-CM | POA: Diagnosis not present

## 2024-12-18 DIAGNOSIS — I5033 Acute on chronic diastolic (congestive) heart failure: Secondary | ICD-10-CM | POA: Diagnosis present

## 2024-12-18 DIAGNOSIS — D631 Anemia in chronic kidney disease: Secondary | ICD-10-CM | POA: Diagnosis present

## 2024-12-18 DIAGNOSIS — I21A1 Myocardial infarction type 2: Secondary | ICD-10-CM | POA: Diagnosis present

## 2024-12-18 DIAGNOSIS — J441 Chronic obstructive pulmonary disease with (acute) exacerbation: Secondary | ICD-10-CM | POA: Diagnosis present

## 2024-12-18 DIAGNOSIS — K5641 Fecal impaction: Secondary | ICD-10-CM | POA: Diagnosis present

## 2024-12-18 DIAGNOSIS — I4821 Permanent atrial fibrillation: Secondary | ICD-10-CM | POA: Diagnosis present

## 2024-12-18 DIAGNOSIS — F05 Delirium due to known physiological condition: Secondary | ICD-10-CM | POA: Diagnosis present

## 2024-12-18 LAB — BASIC METABOLIC PANEL WITH GFR
Anion gap: 23 — ABNORMAL HIGH (ref 5–15)
BUN: 41 mg/dL — ABNORMAL HIGH (ref 8–23)
CO2: 24 mmol/L (ref 22–32)
Calcium: 7.9 mg/dL — ABNORMAL LOW (ref 8.9–10.3)
Chloride: 91 mmol/L — ABNORMAL LOW (ref 98–111)
Creatinine, Ser: 6.69 mg/dL — ABNORMAL HIGH (ref 0.44–1.00)
GFR, Estimated: 6 mL/min — ABNORMAL LOW
Glucose, Bld: 207 mg/dL — ABNORMAL HIGH (ref 70–99)
Potassium: 4.4 mmol/L (ref 3.5–5.1)
Sodium: 137 mmol/L (ref 135–145)

## 2024-12-18 LAB — GLUCOSE, CAPILLARY
Glucose-Capillary: 190 mg/dL — ABNORMAL HIGH (ref 70–99)
Glucose-Capillary: 187 mg/dL — ABNORMAL HIGH (ref 70–99)
Glucose-Capillary: 174 mg/dL — ABNORMAL HIGH (ref 70–99)
Glucose-Capillary: 118 mg/dL — ABNORMAL HIGH (ref 70–99)
Glucose-Capillary: 145 mg/dL — ABNORMAL HIGH (ref 70–99)

## 2024-12-18 LAB — CBC
HCT: 34.9 % — ABNORMAL LOW (ref 36.0–46.0)
Hemoglobin: 10.8 g/dL — ABNORMAL LOW (ref 12.0–15.0)
MCH: 31.4 pg (ref 26.0–34.0)
MCHC: 30.9 g/dL (ref 30.0–36.0)
MCV: 101.5 fL — ABNORMAL HIGH (ref 80.0–100.0)
Platelets: 122 K/uL — ABNORMAL LOW (ref 150–400)
RBC: 3.44 MIL/uL — ABNORMAL LOW (ref 3.87–5.11)
RDW: 19.1 % — ABNORMAL HIGH (ref 11.5–15.5)
WBC: 7.4 K/uL (ref 4.0–10.5)
nRBC: 0 % (ref 0.0–0.2)

## 2024-12-18 LAB — MRSA NEXT GEN BY PCR, NASAL: MRSA by PCR Next Gen: NOT DETECTED

## 2024-12-18 LAB — TROPONIN T, HIGH SENSITIVITY
Troponin T High Sensitivity: 162 ng/L (ref 0–19)
Troponin T High Sensitivity: 169 ng/L (ref 0–19)

## 2024-12-18 MED ORDER — OSELTAMIVIR PHOSPHATE 30 MG PO CAPS
30.0000 mg | ORAL_CAPSULE | Freq: Once | ORAL | Status: DC
  Filled 2024-12-18: qty 1

## 2024-12-18 MED ORDER — OSELTAMIVIR PHOSPHATE 30 MG PO CAPS
30.0000 mg | ORAL_CAPSULE | Freq: Once | ORAL | Status: AC
  Administered 2024-12-18: 30 mg via ORAL
  Filled 2024-12-18: qty 1

## 2024-12-18 NOTE — Progress Notes (Signed)
 " [facility]  PROGRESS NOTE   Subjective:   Patient seen and assessed.  On respiratory isolation.  Objective:  Vital signs: Blood pressure (!) 111/59, pulse 72, temperature (!) 97.5 F (36.4 C), temperature source Oral, resp. rate 20, height 5' 4 (1.626 m), weight 99 kg, SpO2 99%.  Intake/Output Summary (Last 24 hours) at 12/18/2024 1317 Last data filed at 12/17/2024 1509 Gross per 24 hour  Intake 100 ml  Output --  Net 100 ml   Filed Weights   12/17/24 1140 12/18/24 0041  Weight: 101.4 kg 99 kg     Physical Exam: General:  No acute distress  Head:  Normocephalic, atraumatic. Moist oral mucosal membranes  Eyes:  Anicteric  Neck:  Supple  Lungs:   Clear to auscultation, normal effort  Heart:  S1S2 no rubs  Abdomen:   Soft, nontender, bowel sounds present  Extremities:  peripheral edema.  Neurologic:  Awake, alert, following commands  Skin:  No lesions  Access:     Basic Metabolic Panel: Recent Labs  Lab 12/17/24 1140 12/17/24 2105 12/18/24 0111  NA 136  --  137  K 4.4  --  4.4  CL 92*  --  91*  CO2 29  --  24  GLUCOSE 181*  --  207*  BUN 33*  --  41*  CREATININE 6.23* 6.50* 6.69*  CALCIUM  7.7*  --  7.9*   GFR: Estimated Creatinine Clearance: 7.5 mL/min (A) (by C-G formula based on SCr of 6.69 mg/dL (H)).  Liver Function Tests: Recent Labs  Lab 12/17/24 1140  AST 33  ALT 13  ALKPHOS 181*  BILITOT 0.3  PROT 6.6  ALBUMIN  3.5   No results for input(s): LIPASE, AMYLASE in the last 168 hours. No results for input(s): AMMONIA in the last 168 hours.  CBC: Recent Labs  Lab 12/17/24 1140 12/17/24 2105 12/18/24 0111  WBC 6.1 6.6 7.4  NEUTROABS 5.6  --   --   HGB 10.3* 10.1* 10.8*  HCT 33.2* 33.2* 34.9*  MCV 103.4* 102.8* 101.5*  PLT 108* 131* 122*     HbA1C: Hemoglobin A1C  Date/Time Value Ref Range Status  06/12/2021 12:00 AM 6.6  Final   Hgb A1c MFr Bld  Date/Time Value Ref Range Status  06/21/2024 08:00 AM 6.0 (H)  4.8 - 5.6 % Final    Comment:    (NOTE) Diagnosis of Diabetes The following HbA1c ranges recommended by the American Diabetes Association (ADA) may be used as an aid in the diagnosis of diabetes mellitus.  Hemoglobin             Suggested A1C NGSP%              Diagnosis  <5.7                   Non Diabetic  5.7-6.4                Pre-Diabetic  >6.4                   Diabetic  <7.0                   Glycemic control for                       adults with diabetes.    06/07/2023 07:44 PM 5.8 (H) 4.8 - 5.6 % Final    Comment:    (NOTE) Pre diabetes:  5.7%-6.4%  Diabetes:              >6.4%  Glycemic control for   <7.0% adults with diabetes     Urinalysis: No results for input(s): COLORURINE, LABSPEC, PHURINE, GLUCOSEU, HGBUR, BILIRUBINUR, KETONESUR, PROTEINUR, UROBILINOGEN, NITRITE, LEUKOCYTESUR in the last 72 hours.  Invalid input(s): APPERANCEUR    Imaging: CT ABDOMEN PELVIS WO CONTRAST Result Date: 12/17/2024 CLINICAL DATA:  Constipation and vomiting EXAM: CT ABDOMEN AND PELVIS WITHOUT CONTRAST TECHNIQUE: Multidetector CT imaging of the abdomen and pelvis was performed following the standard protocol without IV contrast. RADIATION DOSE REDUCTION: This exam was performed according to the departmental dose-optimization program which includes automated exposure control, adjustment of the mA and/or kV according to patient size and/or use of iterative reconstruction technique. COMPARISON:  Same-day abdominal radiograph, CT abdomen and pelvis dated 11/25/2024 FINDINGS: Lower chest: No focal consolidation or pulmonary nodule in the lung bases. Trace bilateral pleural effusions. Multichamber cardiomegaly. Coronary artery calcifications. Hepatobiliary: No focal hepatic lesions. No intra or extrahepatic biliary ductal dilation. Cholelithiasis. Pancreas: No focal lesions or main ductal dilation. Spleen: Normal in size without focal abnormality.  Adrenals/Urinary Tract: No adrenal nodules. Atrophic kidneys containing multifocal bilateral cysts, largest arising exophytically from the left interpolar kidney measuring 9.8 cm (4:42). No suspicious renal mass, calculi or hydronephrosis. Decompressed urinary bladder. Stomach/Bowel: Partially imaged mildly patulous lower esophagus containing a few nodular densities measuring up to 8 mm (4:17). Normal appearance of the stomach. No evidence of bowel wall thickening, distention, or inflammatory changes. Moderate volume stool within the rectum. Sigmoid colon is located within the right lower quadrant. Small volume stool within the upstream colon. Appy normal appendix. Appendix is not discretely seen. Vascular/Lymphatic: Aortic atherosclerosis. No enlarged abdominal or pelvic lymph nodes. Reproductive: No adnexal masses. Other: No free fluid, fluid collection, or free air. Musculoskeletal: Increased height loss of L5 compression fracture. Unchanged subtle rounded sclerotic focus within the left iliac (4:70). Multilevel degenerative changes of the partially imaged thoracic and lumbar spine. Left lateral flank subcutaneous soft tissue edema. IMPRESSION: 1. Moderate volume stool within the rectum may represent fecal impaction. Small volume stool within the upstream colon. No abnormal bowel dilation. 2. Partially imaged mildly patulous lower esophagus containing a few nodular densities measuring up to 8 mm. Consider correlation with endoscopy. 3. Increased height loss of L5 compression fracture. 4. Trace bilateral pleural effusions. 5.  Aortic Atherosclerosis (ICD10-I70.0). Electronically Signed   By: Limin  Xu M.D.   On: 12/17/2024 16:29   DG Abdomen 1 View Result Date: 12/17/2024 CLINICAL DATA:  Constipation, vomiting EXAM: ABDOMEN - 1 VIEW COMPARISON:  November 25, 2024 FINDINGS: The bowel gas pattern is normal. Mild amount of stool is seen in the colon and rectum. No radio-opaque calculi or other significant  radiographic abnormality are seen. IMPRESSION: No abnormal bowel dilatation. Mild stool burden. Electronically Signed   By: Lynwood Landy Raddle M.D.   On: 12/17/2024 12:46   DG Chest 2 View Result Date: 12/17/2024 CLINICAL DATA:  Cough EXAM: CHEST - 2 VIEW COMPARISON:  November 25, 2024 FINDINGS: Stable cardiomegaly. Possible left retrocardiac opacity concerning for atelectasis or infiltrate and possible effusion. Bony thorax is unremarkable. IMPRESSION: Possible left retrocardiac opacity as described above. Electronically Signed   By: Lynwood Landy Raddle M.D.   On: 12/17/2024 12:45     Medications:    cefTRIAXone  (ROCEPHIN )  IV      allopurinol   100 mg Oral Daily   apixaban   2.5 mg Oral BID  doxycycline   100 mg Oral Q12H   droxidopa   200 mg Oral TID with meals   ezetimibe   10 mg Oral Daily   fludrocortisone   0.2 mg Oral BID   insulin  aspart  0-20 Units Subcutaneous TID WC   lactulose   20 g Oral TID   lidocaine   1 patch Transdermal Q24H   lidocaine   1 Application Topical BID   midodrine   10 mg Oral TID PC   pantoprazole   40 mg Oral Daily   predniSONE   40 mg Oral Q breakfast   rosuvastatin   10 mg Oral Daily   senna  1 tablet Oral BID   umeclidinium-vilanterol  1 puff Inhalation Daily    Assessment/ Plan:     79 year old female with history of hypertension, coronary artery disease, diabetes, COPD, atrial fibrillation, end-stage renal disease on hemodialysis on Tuesday Thursday Saturday schedule.  She is now admitted with history of constipation and also respiratory illness.  She was found to have influenza A and is being treated for the same.  #1: ESRD: Will dialyze her today with fluid removal as tolerated.  #2: Anemia: Will continue to monitor closely.  #3: Secondary hyperparathyroidism: Will check her PTH, calcium  and phosphorus levels.  #4: COPD/pneumonia: Patient was started on oxygen  along with prednisone  and Rocephin  with doxycycline .  Will continue to monitor closely.  Labs  and medications reviewed. Will continue to follow along with you.   LOS: 0 Pinkey Edman, MD [facility] 1/17/20261:17 PM  "

## 2024-12-18 NOTE — Care Management Obs Status (Signed)
 MEDICARE OBSERVATION STATUS NOTIFICATION   Patient Details  Name: Jacqueline [name] MRN: [id number] Date of Birth: 1946-03-07   Medicare Observation Status Notification Given:  Yes  Patient is on airborne precaution  Delphine KANDICE Bring, RN 12/18/2024, 11:30 AM

## 2024-12-18 NOTE — Consult Note (Signed)
 "  Ruel Kung , MD [address], [address], [name], [place], [address] Phone: [phone] Fax: [phone]  Consultation  Referring Provider:     Dr Daria Bitter  Primary Care Physician:  Austin Candyce Stall, MD Primary Gastroenterologist:  Dr. Legrand      Reason for Consultation:     nausea ,vomiting , facal impaction, abnormal imaging  Date of Admission:  12/17/2024 Date of Consultation:  12/18/2024         HPI:   Jacqueline [name] is a 79 y.o. female with a history of ESRD, A-fib on Eliquis , COPD on oxygen , CKD, hypertension, CAD resides at SNF presented to ER with abdominal pain constipation and shortness of breath with cough.  12/17/2024 CT abdomen and pelvis shows moderate volume stool within the rectum and suggestive of fecal impaction partially imaged mildly patulous lower esophagus containing a few nodular densities up to 8 mm consider correlation with endoscopy.  An x-ray performed at the same time showed mild stool burden.   Hemoglobin 10.8 g and MCV open 11.5 creatinine 6.69 CO2 of 24 troponin 169.  Being treated for influenza A infection with possible superimposed pneumonia and exacerbation of COPD for the constipation and abdominal pain likely due to opioid use been given mineral oil enema commenced on lactulose  3 times daily and plan is to give GoLytely  if there is no response to clear of the colon.  Since she was given enema she hadf a good bowel movement and has no abdominal pain. Says has had diffulcty swallowing solids and liquids for months . No recent change. No recent EGD.   Past Medical History:  Diagnosis Date   Arthritis    left knee (12/04/2016)   CAD (coronary artery disease)    a. 12/2016 NSTEMI/PCI: LM nl, LaD 20p/d, D1 20, OM2 20, OM3 20, RCA 46m (3.0x18 Resolute DES). EF 65%.   Chronic lower back pain    Diverticulosis    on CT   ESRD on dialysis [facility])    GERD (gastroesophageal reflux disease)    Hiatal hernia    History of echocardiogram    a. 09/2022  Echo: EF 60-65%, mild asymm LVH, mod reduced RV fxn, RVSP . Mildly dil RA. Mild AI. Ao sclerosis. Mild MR.   Hyperlipidemia    Hypertension    a. 12/2016 labetalol  d/c'd 2/2 hypotension in HD.   Morbid obesity (HCC)    Obesity    Persistent atrial fibrillation (HCC)    a. 05/2023 Zio: 100% afib avg HR 52 (32-99).   Pneumonia    couple times (12/04/2016)   Symptomatic bradycardia 06/07/2023   Type II diabetes mellitus [facility])     Past Surgical History:  Procedure Laterality Date   A/V FISTULAGRAM N/A 11/12/2023   Procedure: A/V Fistulagram;  Surgeon: Melia Lynwood ORN, MD;  Location: [facility] INVASIVE CV LAB;  Service: Cardiovascular;  Laterality: N/A;   A/V FISTULAGRAM Left 09/29/2024   Procedure: A/V Fistulagram;  Surgeon: Pearline Norman RAMAN, MD;  Location: [facility] PV LAB;  Service: Cardiovascular;  Laterality: Left;   ABDOMINAL HYSTERECTOMY     AV FISTULA PLACEMENT Left 01/27/2014   Procedure: ARTERIOVENOUS (AV) FISTULA CREATION;  Surgeon: Lynwood JONETTA Collum, MD;  Location: [facility] OR;  Service: Vascular;  Laterality: Left;   AV FISTULA REPAIR     had it cleaned out   CARDIAC CATHETERIZATION  ?1980s   CARDIAC CATHETERIZATION N/A 12/04/2016   Procedure: Left Heart Cath Angiography;  Surgeon: Lonni JONETTA Cash, MD;  Location:  [facility] INVASIVE CV LAB;  Service: Cardiovascular;  Laterality: N/A;   CARDIAC CATHETERIZATION N/A 12/04/2016   Procedure: Coronary Stent Intervention;  Surgeon: Lonni JONETTA Cash, MD;  Location: [facility] INVASIVE CV LAB;  Service: Cardiovascular;  Laterality: N/A;  Mid RCA   CATARACT EXTRACTION W/ INTRAOCULAR LENS IMPLANT Left    COLONOSCOPY W/ BIOPSIES AND POLYPECTOMY     CORONARY ANGIOPLASTY WITH STENT PLACEMENT  12/04/2016   ESOPHAGOGASTRODUODENOSCOPY     INSERTION OF DIALYSIS CATHETER N/A 01/19/2014   Procedure: INSERTION OF DIALYSIS CATHETER;  Surgeon: Krystal JULIANNA Doing, MD;  Location: [facility] OR;  Service: Vascular;  Laterality: N/A;   PERIPHERAL VASCULAR BALLOON ANGIOPLASTY  11/12/2023    Procedure: PERIPHERAL VASCULAR BALLOON ANGIOPLASTY;  Surgeon: Melia Lynwood ORN, MD;  Location: [facility] INVASIVE CV LAB;  Service: Cardiovascular;;   RIGHT/LEFT HEART CATH AND CORONARY ANGIOGRAPHY N/A 06/11/2023   Procedure: RIGHT/LEFT HEART CATH AND CORONARY ANGIOGRAPHY;  Surgeon: Mady Lonni, MD;  Location: [facility] INVASIVE CV LAB;  Service: Cardiovascular;  Laterality: N/A;   VENOUS ANGIOPLASTY  09/29/2024   Procedure: VENOUS ANGIOPLASTY;  Surgeon: Pearline Norman RAMAN, MD;  Location: [facility] PV LAB;  Service: Cardiovascular;;  cephalic arch    Prior to Admission medications  Medication Sig Start Date End Date Taking? Authorizing Provider  acetaminophen  (TYLENOL ) 650 MG CR tablet Take 650 mg by mouth every 8 (eight) hours. (0600, 1400 & 2200)   Yes [provider]  albuterol  (PROVENTIL ) (2.5 MG/3ML) 0.083% nebulizer solution Take 2.5 mg by nebulization every 6 (six) hours as needed for wheezing or shortness of breath.   Yes [provider]  allopurinol  (ZYLOPRIM ) 100 MG tablet Take 100 mg by mouth daily.   Yes [provider]  aluminum -magnesium  hydroxide-simethicone  (MAALOX) 200-200-20 MG/5ML SUSP Take 30 mLs by mouth every 4 (four) hours as needed.   Yes [provider]  apixaban  (ELIQUIS ) 2.5 MG TABS tablet Take 1 tablet (2.5 mg total) by mouth 2 (two) times daily. 06/25/24  Yes Arlon Carliss ORN, DO  benzonatate  (TESSALON ) 100 MG capsule Take 100 mg by mouth 3 (three) times daily as needed for cough.   Yes [provider]  bisacodyl  (DULCOLAX) 10 MG suppository Place 10 mg rectally daily as needed for moderate constipation.   Yes [provider]  cinacalcet  (SENSIPAR ) 30 MG tablet Take 30 mg by mouth daily with supper. (1800)   Yes [provider]  clotrimazole-betamethasone (LOTRISONE) cream Apply 1 Application topically See admin instructions. Apply as needed to affected area of ear for itching/irritation.   Yes [provider]   Droxidopa  200 MG CAPS Take 200 mg by mouth with breakfast, with lunch, and with evening meal.   Yes [provider]  ezetimibe  (ZETIA ) 10 MG tablet Take 1 tablet (10 mg total) by mouth daily. 04/30/22  Yes Darron Deatrice LABOR, MD  ferric citrate  (AURYXIA ) 1 GM 210 MG(Fe) tablet Take 420 mg by mouth 3 (three) times daily with meals.   Yes [provider]  fexofenadine (ALLEGRA) 180 MG tablet Take 180 mg by mouth in the morning. (0800)   Yes [provider]  fludrocortisone  (FLORINEF ) 0.1 MG tablet Take 2 tablets (0.2 mg total) by mouth 2 (two) times daily. 07/01/23  Yes Fausto Sor A, DO  fluticasone (FLONASE) 50 MCG/ACT nasal spray Place 1 spray into both nostrils in the morning. (0900)   Yes [provider]  guaiFENesin -dextromethorphan  (ROBITUSSIN DM) 100-10 MG/5ML syrup Take 15 mLs by mouth every 4 (four) hours as needed for  cough.   Yes [provider]  HYDROcodone -acetaminophen  (NORCO/VICODIN) 5-325 MG tablet Take 1-2 tablets by mouth every 6 (six) hours as needed for moderate pain. 07/01/23  Yes Fausto Sor A, DO  insulin  aspart (NOVOLOG ) 100 UNIT/ML injection Inject 0-20 Units into the skin 3 (three) times daily with meals. For CBG 70-120: 0 units For CBG 121-150: 3 units For CBG 151-200: 4 units For CBG 201-250: 7 units For CBG 251-300: 11 units For CBG 301-350: 15 units For CBG 351-400: 20 units For CBG > 400: 25 units and call your doctor Patient taking differently: Inject 0-25 Units into the skin 3 (three) times daily with meals. For CBG 70-120: 0 units For CBG 121-150: 3 units For CBG 151-200: 4 units For CBG 201-250: 7 units For CBG 251-300: 11 units For CBG 301-350: 15 units For CBG 351-400: 20 units For CBG > 400: 25 units and call your doctor 07/01/23  Yes Fausto Sor A, DO  ipratropium-albuterol  (DUONEB) 0.5-2.5 (3) MG/3ML SOLN Take 3 mLs by nebulization 2 (two) times daily. 06/25/24  Yes Arlon Carliss ORN, DO  lactulose   (CHRONULAC ) 10 GM/15ML solution Take 20 g by mouth 2 (two) times daily. 12/16/24  Yes [provider]  lidocaine  (LIDODERM ) 5 % Place 1 patch onto the skin daily. 10/26/24  Yes [provider]  Lidocaine  HCl (ASPERCREME LIDOCAINE ) 4 % CREA Apply 1 Application topically in the morning and at bedtime. Applied to bilateral knees   Yes [provider]  magnesium  hydroxide (MILK OF MAGNESIA) 400 MG/5ML suspension Take 30 mLs by mouth daily as needed (if no BM in 3 days.).   Yes [provider]  Menthol -Zinc  Oxide (MOISTURE BARRIER EX) Apply 1 Application topically as directed. Apply barrier cream to buttocks every shift   Yes [provider]  midodrine  (PROAMATINE ) 10 MG tablet Take 10 mg by mouth 3 (three) times daily.   Yes [provider]  Multiple Vitamin (MULTIVITAMIN WITH MINERALS) TABS tablet Take 1 tablet by mouth at bedtime.   Yes [provider]  naloxone  (NARCAN ) nasal spray 4 mg/0.1 mL Place 1 spray into the nose 3 (three) times daily as needed (signs and symptoms of opioid overdose).   Yes [provider]  nitroGLYCERIN  (NITROSTAT ) 0.4 MG SL tablet Place 1 tablet (0.4 mg total) under the tongue every 5 (five) minutes as needed for chest pain. 07/01/23  Yes Fausto Sor A, DO  ondansetron  (ZOFRAN -ODT) 4 MG disintegrating tablet Take 4 mg by mouth every 8 (eight) hours as needed for nausea or vomiting.   Yes [provider]  pantoprazole  (PROTONIX ) 40 MG tablet Take 1 tablet (40 mg total) by mouth daily. 07/02/23  Yes Fausto Sor A, DO  polyethylene glycol (MIRALAX  / GLYCOLAX ) 17 g packet Take 17 g by mouth daily.   Yes [provider]  promethazine -dextromethorphan  (PROMETHAZINE -DM) 6.25-15 MG/5ML syrup Take 5 mLs by mouth 3 (three) times daily.   Yes [provider]  rosuvastatin  (CRESTOR ) 10 MG tablet Take 1 tablet (10 mg total) by mouth daily. 10/03/22  Yes Sreenath, Sudheer B, MD   senna-docusate (SENOKOT-S) 8.6-50 MG tablet Take 2 tablets by mouth at bedtime.   Yes [provider]  simethicone  (MYLICON) 80 MG chewable tablet Chew 80 mg by mouth daily as needed for flatulence.   Yes [provider]  sodium phosphate (FLEET) ENEM Place 1 enema rectally daily as needed for severe constipation (if no BM results in 12 hours from dulcolax suppository).  Yes [provider]  sorbitol  70 % solution Take 30 mLs by mouth daily as needed (constipation.).   Yes [provider]  dextromethorphan -guaiFENesin  (MUCINEX  DM) 30-600 MG 12hr tablet Take 1 tablet by mouth 2 (two) times daily. (0900 & 1800) Patient not taking: Reported on 12/17/2024    [provider]  oseltamivir  (TAMIFLU ) 30 MG capsule Take 30 mg by mouth 2 (two) times daily. Patient not taking: Reported on 12/17/2024 12/16/24   [provider]  OXYGEN  Inhale 2 L/min into the lungs as needed (shortness of breath or decreased O2).    [provider]  umeclidinium-vilanterol (ANORO ELLIPTA ) 62.5-25 MCG/ACT AEPB Inhale 1 puff into the lungs daily. Patient not taking: Reported on 12/17/2024 10/23/24   [provider]    Family History  Problem Relation Age of Onset   Diabetes Mother    Peripheral vascular disease Mother        amputation   CAD Father    Heart disease Father    Hypertension Father    Heart attack Father    CAD Sister    Cancer Brother        x 2 brothers, type unknown   Breast cancer Sister      Social History[1]  Allergies as of 12/17/2024 - Review Complete 12/17/2024  Allergen Reaction Noted   Sulfa antibiotics Other (See Comments) 12/15/2019   Sulfa drugs cross reactors Other (See Comments) 10/16/2011    Review of Systems:    All systems reviewed and negative except where noted in HPI.   Physical Exam:  Vital signs in last 24 hours: Temp:  [97.6 F (36.4 C)-98.5 F (36.9 C)] 97.7 F (36.5 C) (01/17 0800) Pulse Rate:   [73-85] 79 (01/17 0800) Resp:  [16-26] 18 (01/17 0800) BP: (103-126)/(52-70) 118/62 (01/17 0800) SpO2:  [93 %-100 %] 93 % (01/17 0800) Weight:  [99 kg-101.4 kg] 99 kg (01/17 0041) Last BM Date : 12/18/24 General:   Pleasant, cooperative in NAD Head:  Normocephalic and atraumatic. Eyes:   No icterus.   Conjunctiva pink. PERRLA. Ears:  Normal auditory acuity. Neck:  Supple; no masses or thyroidomegaly Lungs: Respirations even and unlabored decreased B/l . No wheezes, crackles, or rhonchi.  Heart:  Regular rate and rhythm;  Without murmur, clicks, rubs or gallops Abdomen:  Soft, nondistended, nontender. Normal bowel sounds. No appreciable masses or hepatomegaly.  No rebound or guarding.  Neurologic:  Alert and oriented x3;  grossly normal neurologically. Skin:  Intact without significant lesions or rashes. Cervical Nodes:  No significant cervical adenopathy. Psych:  Alert and cooperative. Normal affect.  LAB RESULTS: Recent Labs    12/17/24 1140 12/17/24 2105 12/18/24 0111  WBC 6.1 6.6 7.4  HGB 10.3* 10.1* 10.8*  HCT 33.2* 33.2* 34.9*  PLT 108* 131* 122*   BMET Recent Labs    12/17/24 1140 12/17/24 2105 12/18/24 0111  NA 136  --  137  K 4.4  --  4.4  CL 92*  --  91*  CO2 29  --  24  GLUCOSE 181*  --  207*  BUN 33*  --  41*  CREATININE 6.23* 6.50* 6.69*  CALCIUM  7.7*  --  7.9*   LFT Recent Labs    12/17/24 1140  PROT 6.6  ALBUMIN  3.5  AST 33  ALT 13  ALKPHOS 181*  BILITOT 0.3   PT/INR No results for input(s): LABPROT, INR in the last 72 hours.  STUDIES: CT ABDOMEN PELVIS WO CONTRAST Result Date: 12/17/2024  CLINICAL DATA:  Constipation and vomiting EXAM: CT ABDOMEN AND PELVIS WITHOUT CONTRAST TECHNIQUE: Multidetector CT imaging of the abdomen and pelvis was performed following the standard protocol without IV contrast. RADIATION DOSE REDUCTION: This exam was performed according to the departmental dose-optimization program which includes automated exposure  control, adjustment of the mA and/or kV according to patient size and/or use of iterative reconstruction technique. COMPARISON:  Same-day abdominal radiograph, CT abdomen and pelvis dated 11/25/2024 FINDINGS: Lower chest: No focal consolidation or pulmonary nodule in the lung bases. Trace bilateral pleural effusions. Multichamber cardiomegaly. Coronary artery calcifications. Hepatobiliary: No focal hepatic lesions. No intra or extrahepatic biliary ductal dilation. Cholelithiasis. Pancreas: No focal lesions or main ductal dilation. Spleen: Normal in size without focal abnormality. Adrenals/Urinary Tract: No adrenal nodules. Atrophic kidneys containing multifocal bilateral cysts, largest arising exophytically from the left interpolar kidney measuring 9.8 cm (4:42). No suspicious renal mass, calculi or hydronephrosis. Decompressed urinary bladder. Stomach/Bowel: Partially imaged mildly patulous lower esophagus containing a few nodular densities measuring up to 8 mm (4:17). Normal appearance of the stomach. No evidence of bowel wall thickening, distention, or inflammatory changes. Moderate volume stool within the rectum. Sigmoid colon is located within the right lower quadrant. Small volume stool within the upstream colon. Appy normal appendix. Appendix is not discretely seen. Vascular/Lymphatic: Aortic atherosclerosis. No enlarged abdominal or pelvic lymph nodes. Reproductive: No adnexal masses. Other: No free fluid, fluid collection, or free air. Musculoskeletal: Increased height loss of L5 compression fracture. Unchanged subtle rounded sclerotic focus within the left iliac (4:70). Multilevel degenerative changes of the partially imaged thoracic and lumbar spine. Left lateral flank subcutaneous soft tissue edema. IMPRESSION: 1. Moderate volume stool within the rectum may represent fecal impaction. Small volume stool within the upstream colon. No abnormal bowel dilation. 2. Partially imaged mildly patulous lower  esophagus containing a few nodular densities measuring up to 8 mm. Consider correlation with endoscopy. 3. Increased height loss of L5 compression fracture. 4. Trace bilateral pleural effusions. 5.  Aortic Atherosclerosis (ICD10-I70.0). Electronically Signed   By: Limin  Xu M.D.   On: 12/17/2024 16:29   DG Abdomen 1 View Result Date: 12/17/2024 CLINICAL DATA:  Constipation, vomiting EXAM: ABDOMEN - 1 VIEW COMPARISON:  November 25, 2024 FINDINGS: The bowel gas pattern is normal. Mild amount of stool is seen in the colon and rectum. No radio-opaque calculi or other significant radiographic abnormality are seen. IMPRESSION: No abnormal bowel dilatation. Mild stool burden. Electronically Signed   By: Lynwood Landy Raddle M.D.   On: 12/17/2024 12:46   DG Chest 2 View Result Date: 12/17/2024 CLINICAL DATA:  Cough EXAM: CHEST - 2 VIEW COMPARISON:  November 25, 2024 FINDINGS: Stable cardiomegaly. Possible left retrocardiac opacity concerning for atelectasis or infiltrate and possible effusion. Bony thorax is unremarkable. IMPRESSION: Possible left retrocardiac opacity as described above. Electronically Signed   By: Lynwood Landy Raddle M.D.   On: 12/17/2024 12:45      Impression / Plan:   KHILEY LIESER is a 79 y.o. y/o female with multiple comorbidities including CKD, CAD, end-stage renal disease, resides at [facility] admitted with abdominal pain found to have constipation, influenza A infection with superimposed.  I was consulted for the constipation as well as for the densities seen in the esophagus incidentally on CT scan of the abdomen.  Impression and plan  In terms of the density seen within the esophagus it could be food particles or it could be something more sinister such as a neoplasm. At this point  of time would not recommend any endoscopic evaluation as the patient would be extremely high risk due to respiratory failure and pneumonia.  Probably close to discharge when swallowing, eating and drinking has  reached baseline consider a barium swallow to evaluate further to rule out achalasia .  If inconclusive an endoscopy should be considered as an outpatient after full evaluation of respiratory status and discussion in the office about risks versus benefits the patient is known to [facility].  I will sign off.  Please call me if any further GI concerns or questions.  We would like to thank you for the opportunity to participate in the care of HATSUKO BIZZARRO.  Thank you for involving me in the care of this patient.      LOS: 0 days   Ruel Kung, MD  12/18/2024, 9:15 AM         [1]  Social History Tobacco Use   Smoking status: Former    Current packs/day: 0.00    Average packs/day: 1 pack/day for 20.0 years (20.0 ttl pk-yrs)    Types: Cigarettes    Start date: 45    Quit date: 1985    Years since quitting: 41.0   Smokeless tobacco: Never  Vaping Use   Vaping status: Never Used  Substance Use Topics   Alcohol  use: No    Alcohol /week: 0.0 standard drinks of alcohol    Drug use: No   "

## 2024-12-18 NOTE — Progress Notes (Signed)
 " PROGRESS NOTE    Jacqueline [name]  FMW:[id number] DOB: [birth year] DOA: 12/17/2024 PCP: Austin Candyce Stall, MD    Brief Narrative:   79 y.o. female with medical history significant for A-fib on Eliquis , ESRD on HD (TTS), T2DM, HFpEF (LVEF 60-65%), COPD on chronic 1 L O2, anemia of CKD, HTN, CAD s/p DES to RCA (12/2016), GERD, HLD, morbid obesity who presented to the ED from [facility] on 12/18/2023 due to abdominal pain, constipation, shortness of breath, and cough, with concern for sigmoid volvulus on imaging.  Collateral obtained from staff at [facility], reported patient was complaining of significant abdominal pain with abdominal x-ray concerning for sigmoid volvulus.  They noted patient has been constipated with last documented bowel movement on 1/14 noted to be small and hard.  Further history obtained from the patient and her son, [name], over the phone, indicating the patient had been also complaining of 3 days of cough productive of clear sputum and shortness of breath, generally feeling unwell and missing her HD session on Thursday, which was deviation from her normal strict compliance with her HD sessions.    In the ED, she was afebrile with a temp of 98.5 F, HR 79, RR 20, BP 110/55, SpO2 96-100% on 2 L [place].  CBC showed no leukocytosis, Hgb 10.3, thrombocytopenia to 108.  CMP without acute abnormalities.  4 Plex PCR positive for influenza A.  proBNP elevated to >35,000.  High-sensitivity troponin 165 > 161. EKG showed A-fib with incomplete RBBB and ventricular rate of 86 without acute ST segment changes. CXR showed stable cardiomegaly with possible left retrocardiac opacity concerning for atelectasis versus infiltrate and possible effusion. Abdominal x-ray showed no abnormal bowel dilation and mild stool burden CT abdomen pelvis without contrast showed moderate volume stool within the rectum possibly presenting fecal impaction with small volume within the upstream colon and no abnormal  bowel dilation.  Noted partially imaged mildly patulous lower esophagus containing a few nodular densities measuring up to 8 mm.  Increased height loss of L5 compression fracture.  Trace bilateral pleural effusions.   She was admitted for shortness of breath in the setting of recently missed dialysis with influenza A and concern for superimposed pneumonia as well as constipation.  Assessment and Plan:  Influenza A infection with possible superimposed pneumonia Acute exacerbation of COPD - Presenting with 2-3 days of cough productive of clear sputum and increased SOB - No leukocytosis, afebrile. End expiratory wheezing noted on exam bilaterally - CXR showed possible left retrocardiac opacity concerning for atelectasis versus infiltrate  - Procalcitonin elevated to 1.01 - IV Rocephin  and PO doxycycline  for 5 days - Tamiflu  30 mg every other day for 3 days - Received IV Solumedrol in the ED. Continue prednisone  40 mg for 5 days  - PRN DuoNebs - Continue home Anoro Ellipta  - Flutter valve    Constipation  Fecal impaction Diffuse abdominal pain  Nausea/Vomiting - Presented with concern for sigmoid volvulus on outside KUB. Patient had been complaining of diffuse abdominal pain, with nausea and inability to keep down PO intake, sensation of fullness, and no bowel movements for several days. She is passing gas. Per [facility] staff, last recorded BM on 1/14 noted to be small and hard - Suspect abdominal pain and N/V secondary to fecal impaction/constipation - Suspect constipation secondary to opioids and PO Auryxia   - CT abdomen pelvis showed no evidence of volvulus, noted moderate volume stool within rectum and small volume stool within upstream colon -  Per [facility] staff, she is on Lactulose  20 g BID, senna x2 nightly, and PRN sorbitol  - Mineral oil enema ordered in ED - Lactulose  20 g TID, Senna BID, PRN Dulcolax suppository - Consider GoLytely  if no significant relief   Chronic  hypoxic respiratory failure - Uses 1L [place] at home (order appears to be for 2 L/min) - Placed on 2L [place] with SpO2 96-100% and no documented desaturation, do not suspect acute hypoxia at this time - Wean down to baseline O2 of 1L - Goal SpO2 88-92%   Volume overload Bilateral pleural effusions, trace - ProBNP elevated to > 35,000 - CT abd/pelvis showed trace bilateral pleural effusions - Management with HD   ESRD on HD - TTS schedule, last HD on Tuesday, missed Thursday session - Nephrology consulted for HD   Type II MI - hsTroponins elevated to 165 > 161 - Denies chest pain - EKG without acute ST-segment changes - Likely secondary to demand ischemia given influenza and possible superimposed pneumonia in the setting of ESRD    L5 compression fracture - Previously noted on CT abd/pelvis in 11/2024 - CT abd/pelvis (1/16) showed increased height loss of L5 compression fracture - Likely contributing referred pain to abdominal area - PRN oxycodone  - Lidocaine  patch - Would benefit from outpatient referral to spine and consideration of kyphoplasty if indicated   Patulous esophagus - CT abdomen/pelvis showed mildly patulous lower esophagus containing a few nodular densities measuring up to 8 mm with recommendation for correlation with endoscopy - GI consulted, Dr. Therisa evaluated patient, indicated esophageal CT findings possibly food particles vs neoplasm, recommended barium swallow to rule out achalasia prior to discharge, then outpatient follow up if inconclusive for consideration of EGD   Thrombocytopenia - Unclear etiology - Monitor   T2DM - Hgb A1c (06/2024) 6.0 - Repeat A1c ordered - On SSI only at facility, reportedly refuses correctional insulin  often if BG < 200 - Start SSI 0 - 20 AC - ACHS Accu Checks   Permanent AFib - Currently rate-controlled - Not on rate control agents - Continue Eliquis  2.5 mg twice daily   Chronic hypotension - Continue home Florinef  0.2 mg  twice daily, midodrine  10 mg 3 times daily, droxidopa  200 mg 3 times daily   GERD - Continue Protonix  40 mg daily   Gout - Continue home allopurinol  100 mg daily   HLD - Continue home Crestor  10 mg daily   CAD s/p DES to RCA - Continue Eliquis  and Crestor    Morbid Obesity (BMI Body mass index is 40.89 kg/m. kg/m) - Obesity is clinically significant and complicates care - Recommend continued discussion with PCP re: lifestyle modification including dietary changes, regular physical activity, and weight reduction strategies.   Scheduled Meds:  allopurinol   100 mg Oral Daily   apixaban   2.5 mg Oral BID   doxycycline   100 mg Oral Q12H   droxidopa   200 mg Oral TID with meals   ezetimibe   10 mg Oral Daily   fludrocortisone   0.2 mg Oral BID   insulin  aspart  0-20 Units Subcutaneous TID WC   lactulose   20 g Oral TID   lidocaine   1 patch Transdermal Q24H   lidocaine   1 Application Topical BID   midodrine   10 mg Oral TID PC   pantoprazole   40 mg Oral Daily   predniSONE   40 mg Oral Q breakfast   rosuvastatin   10 mg Oral Daily   senna  1 tablet Oral BID   umeclidinium-vilanterol  1 puff Inhalation Daily   Continuous Infusions:  cefTRIAXone  (ROCEPHIN )  IV     PRN Meds:.acetaminophen  **OR** [DISCONTINUED] acetaminophen , bisacodyl , ipratropium-albuterol , naloxone , ondansetron  **OR** ondansetron  (ZOFRAN ) IV, oxyCODONE   Current Outpatient Medications  Medication Instructions   acetaminophen  (TYLENOL ) 650 mg, Oral, Every 8 hours, (0600, 1400 & 2200)   albuterol  (PROVENTIL ) 2.5 mg, Nebulization, Every 6 hours PRN   allopurinol  (ZYLOPRIM ) 100 mg, Oral, Daily   aluminum -magnesium  hydroxide-simethicone  (MAALOX) 200-200-20 MG/5ML SUSP 30 mLs, Oral, Every 4 hours PRN   apixaban  (ELIQUIS ) 2.5 mg, Oral, 2 times daily   benzonatate  (TESSALON ) 100 mg, Oral, 3 times daily PRN   bisacodyl  (DULCOLAX) 10 mg, Rectal, Daily PRN   cinacalcet  (SENSIPAR ) 30 mg, Oral, Daily with supper, (1800)    clotrimazole-betamethasone (LOTRISONE) cream 1 Application, Topical, See admin instructions, Apply as needed to affected area of ear for itching/irritation.    dextromethorphan -guaiFENesin  (MUCINEX  DM) 30-600 MG 12hr tablet 1 tablet, 2 times daily   Droxidopa  200 mg, Oral, 3 times daily with meals   ezetimibe  (ZETIA ) 10 mg, Oral, Daily   ferric citrate  (AURYXIA ) 420 mg, Oral, 3 times daily with meals   fexofenadine (ALLEGRA) 180 mg, Oral, Every morning, (0800)   fludrocortisone  (FLORINEF ) 0.2 mg, Oral, 2 times daily   fluticasone (FLONASE) 50 MCG/ACT nasal spray 1 spray, Each Nare, Every morning, (0900)   guaiFENesin -dextromethorphan  (ROBITUSSIN DM) 100-10 MG/5ML syrup 15 mLs, Oral, Every 4 hours PRN   HYDROcodone -acetaminophen  (NORCO/VICODIN) 5-325 MG tablet 1-2 tablets, Oral, Every 6 hours PRN   insulin  aspart (NOVOLOG ) 0-20 Units, Subcutaneous, 3 times daily with meals, For CBG 70-120: 0 units For CBG 121-150: 3 units For CBG 151-200: 4 units For CBG 201-250: 7 units For CBG 251-300: 11 units For CBG 301-350: 15 units For CBG 351-400: 20 units For CBG > 400: 25 units and call your doctor   ipratropium-albuterol  (DUONEB) 0.5-2.5 (3) MG/3ML SOLN 3 mLs, Nebulization, 2 times daily   lactulose  (CHRONULAC ) 20 g, Oral, 2 times daily   lidocaine  (LIDODERM ) 5 % 1 patch, Transdermal, Daily   Lidocaine  HCl (ASPERCREME LIDOCAINE ) 4 % CREA 1 Application, Topical, 2 times daily, Applied to bilateral knees   magnesium  hydroxide (MILK OF MAGNESIA) 400 MG/5ML suspension 30 mLs, Oral, Daily PRN   Menthol -Zinc  Oxide (MOISTURE BARRIER EX) 1 Application, Topical, As directed, Apply barrier cream to buttocks every shift   midodrine  (PROAMATINE ) 10 mg, Oral, 3 times daily   Multiple Vitamin (MULTIVITAMIN WITH MINERALS) TABS tablet 1 tablet, Oral, Daily at bedtime   naloxone  (NARCAN ) nasal spray 4 mg/0.1 mL 1 spray, Nasal, 3 times daily PRN   nitroGLYCERIN  (NITROSTAT ) 0.4 mg, Sublingual, Every 5 min PRN    ondansetron  (ZOFRAN -ODT) 4 mg, Oral, Every 8 hours PRN   oseltamivir  (TAMIFLU ) 30 mg, 2 times daily   OXYGEN  2 L/min, Inhalation, As needed   pantoprazole  (PROTONIX ) 40 mg, Oral, Daily   polyethylene glycol (MIRALAX  / GLYCOLAX ) 17 g, Oral, Daily   promethazine -dextromethorphan  (PROMETHAZINE -DM) 6.25-15 MG/5ML syrup 5 mLs, Oral, 3 times daily   rosuvastatin  (CRESTOR ) 10 mg, Oral, Daily   senna-docusate (SENOKOT-S) 8.6-50 MG tablet 2 tablets, Oral, Daily at bedtime   simethicone  (MYLICON) 80 mg, Oral, Daily PRN   sodium phosphate (FLEET) ENEM 1 enema, Rectal, Daily PRN   sorbitol  70 % solution 30 mLs, Oral, Daily PRN   umeclidinium-vilanterol (ANORO ELLIPTA ) 62.5-25 MCG/ACT AEPB 1 puff, Daily    DVT prophylaxis: apixaban  (ELIQUIS ) tablet 2.5 mg Start: 12/17/24 2200 apixaban  (ELIQUIS ) tablet 2.5 mg  Code Status:   Code Status: Full Code  Family Communication: None  Disposition Plan: Back to [facility] when clinically improved PT -   -   OT -   -    Level of care: Telemetry  Consultants:  Nephrology, GI  Antimicrobials: IV Rocephin , PO Doxy   Subjective: Seen at HD today, reports feeling better, states had a small loose bowel movement but abdominal pain improved. Cough and SOB also improving. Tolerating HD well.   Objective: Vitals:   12/18/24 0023 12/18/24 0041 12/18/24 0317 12/18/24 0800  BP: 122/62  126/62 118/62  Pulse: 81  73 79  Resp: 16  16 18   Temp: 97.8 F (36.6 C)  97.6 F (36.4 C) 97.7 F (36.5 C)  TempSrc: Oral  Oral Oral  SpO2: 97%  99% 93%  Weight:  99 kg    Height:  5' 4 (1.626 m)      Intake/Output Summary (Last 24 hours) at 12/18/2024 1156 Last data filed at 12/17/2024 1509 Gross per 24 hour  Intake 100 ml  Output --  Net 100 ml   Filed Weights   12/17/24 1140 12/18/24 0041  Weight: 101.4 kg 99 kg    Examination:  Gen: elderly female, NAD, A&Ox3 HEENT: NCAT, EOMI Neck: Supple CV: RRR, no murmurs Resp: normal WOB, bilateral diffuse  end-expiratory wheezing and bibasilar crackles Abd: Soft, generalized tenderness to palpation, nondistended, no guarding, BS normoactive Ext: 1+ LE edema, pulses 2+ b/l, LUE AVF Skin: Warm, dry Neuro: No focal deficits Psych: Calm, cooperative, appropriate affect    Data Reviewed: I have personally reviewed following labs and imaging studies  CBC: Recent Labs  Lab 12/17/24 1140 12/17/24 2105 12/18/24 0111  WBC 6.1 6.6 7.4  NEUTROABS 5.6  --   --   HGB 10.3* 10.1* 10.8*  HCT 33.2* 33.2* 34.9*  MCV 103.4* 102.8* 101.5*  PLT 108* 131* 122*   Basic Metabolic Panel: Recent Labs  Lab 12/17/24 1140 12/17/24 2105 12/18/24 0111  NA 136  --  137  K 4.4  --  4.4  CL 92*  --  91*  CO2 29  --  24  GLUCOSE 181*  --  207*  BUN 33*  --  41*  CREATININE 6.23* 6.50* 6.69*  CALCIUM  7.7*  --  7.9*   GFR: Estimated Creatinine Clearance: 7.5 mL/min (A) (by C-G formula based on SCr of 6.69 mg/dL (H)). Liver Function Tests: Recent Labs  Lab 12/17/24 1140  AST 33  ALT 13  ALKPHOS 181*  BILITOT 0.3  PROT 6.6  ALBUMIN  3.5   No results for input(s): LIPASE, AMYLASE in the last 168 hours. No results for input(s): AMMONIA in the last 168 hours. Coagulation Profile: No results for input(s): INR, PROTIME in the last 168 hours. Cardiac Enzymes: No results for input(s): CKTOTAL, CKMB, CKMBINDEX, TROPONINI in the last 168 hours. BNP (last 3 results) Recent Labs    12/17/24 1148  PROBNP >35,000.0*   HbA1C: No results for input(s): HGBA1C in the last 72 hours. CBG: Recent Labs  Lab 12/17/24 2120 12/18/24 0802 12/18/24 1029 12/18/24 1116  GLUCAP 173* 190* 187* 174*   Lipid Profile: No results for input(s): CHOL, HDL, LDLCALC, TRIG, CHOLHDL, LDLDIRECT in the last 72 hours. Thyroid  Function Tests: No results for input(s): TSH, T4TOTAL, FREET4, T3FREE, THYROIDAB in the last 72 hours. Anemia Panel: No results for input(s): VITAMINB12,  FOLATE, FERRITIN, TIBC, IRON , RETICCTPCT in the last 72 hours. Sepsis Labs: Recent Labs  Lab 12/17/24 2105  PROCALCITON 1.01    Recent Results (from the past 240 hours)  Resp panel by RT-PCR (RSV, Flu A&B, Covid) Anterior Nasal Swab     Status: Abnormal   Collection Time: 12/17/24 11:40 AM   Specimen: Anterior Nasal Swab  Result Value Ref Range Status   SARS Coronavirus 2 by RT PCR NEGATIVE NEGATIVE Final    Comment: (NOTE) SARS-CoV-2 target nucleic acids are NOT DETECTED.  The SARS-CoV-2 RNA is generally detectable in upper respiratory specimens during the acute phase of infection. The lowest concentration of SARS-CoV-2 viral copies this assay can detect is 138 copies/mL. A negative result does not preclude SARS-Cov-2 infection and should not be used as the sole basis for treatment or other patient management decisions. A negative result may occur with  improper specimen collection/handling, submission of specimen other than nasopharyngeal swab, presence of viral mutation(s) within the areas targeted by this assay, and inadequate number of viral copies(<138 copies/mL). A negative result must be combined with clinical observations, patient history, and epidemiological information. The expected result is Negative.  Fact Sheet for Patients:  bloggercourse.com  Fact Sheet for Healthcare Providers:  seriousbroker.it  This test is no t yet approved or cleared by the [place]  FDA and  has been authorized for detection and/or diagnosis of SARS-CoV-2 by FDA under an Emergency Use Authorization (EUA). This EUA will remain  in effect (meaning this test can be used) for the duration of the COVID-19 declaration under Section 564(b)(1) of the Act, 21 U.S.C.section 360bbb-3(b)(1), unless the authorization is terminated  or revoked sooner.       Influenza A by PCR POSITIVE (A) NEGATIVE Final   Influenza B by PCR NEGATIVE  NEGATIVE Final    Comment: (NOTE) The Xpert Xpress SARS-CoV-2/FLU/RSV plus assay is intended as an aid in the diagnosis of influenza from Nasopharyngeal swab specimens and should not be used as a sole basis for treatment. Nasal washings and aspirates are unacceptable for Xpert Xpress SARS-CoV-2/FLU/RSV testing.  Fact Sheet for Patients: bloggercourse.com  Fact Sheet for Healthcare Providers: seriousbroker.it  This test is not yet approved or cleared by the [place]  FDA and has been authorized for detection and/or diagnosis of SARS-CoV-2 by FDA under an Emergency Use Authorization (EUA). This EUA will remain in effect (meaning this test can be used) for the duration of the COVID-19 declaration under Section 564(b)(1) of the Act, 21 U.S.C. section 360bbb-3(b)(1), unless the authorization is terminated or revoked.     Resp Syncytial Virus by PCR NEGATIVE NEGATIVE Final    Comment: (NOTE) Fact Sheet for Patients: bloggercourse.com  Fact Sheet for Healthcare Providers: seriousbroker.it  This test is not yet approved or cleared by the [place]  FDA and has been authorized for detection and/or diagnosis of SARS-CoV-2 by FDA under an Emergency Use Authorization (EUA). This EUA will remain in effect (meaning this test can be used) for the duration of the COVID-19 declaration under Section 564(b)(1) of the Act, 21 U.S.C. section 360bbb-3(b)(1), unless the authorization is terminated or revoked.  Performed at [facility], [address] [place], [place] [address]   MRSA Next Gen by PCR, Nasal     Status: None   Collection Time: 12/18/24 12:49 AM   Specimen: Nasal Mucosa; Nasal Swab  Result Value Ref Range Status   MRSA by PCR Next Gen NOT DETECTED NOT DETECTED Final    Comment: (NOTE) The GeneXpert MRSA Assay (FDA approved for NASAL specimens only), is one  component of a comprehensive MRSA colonization  surveillance program. It is not intended to diagnose MRSA infection nor to guide or monitor treatment for MRSA infections. Test performance is not FDA approved in patients less than 83 years old. Performed at [facility], [address] [place], [place] [address]      Radiology Studies: CT ABDOMEN PELVIS WO CONTRAST Result Date: 12/17/2024 CLINICAL DATA:  Constipation and vomiting EXAM: CT ABDOMEN AND PELVIS WITHOUT CONTRAST TECHNIQUE: Multidetector CT imaging of the abdomen and pelvis was performed following the standard protocol without IV contrast. RADIATION DOSE REDUCTION: This exam was performed according to the departmental dose-optimization program which includes automated exposure control, adjustment of the mA and/or kV according to patient size and/or use of iterative reconstruction technique. COMPARISON:  Same-day abdominal radiograph, CT abdomen and pelvis dated 11/25/2024 FINDINGS: Lower chest: No focal consolidation or pulmonary nodule in the lung bases. Trace bilateral pleural effusions. Multichamber cardiomegaly. Coronary artery calcifications. Hepatobiliary: No focal hepatic lesions. No intra or extrahepatic biliary ductal dilation. Cholelithiasis. Pancreas: No focal lesions or main ductal dilation. Spleen: Normal in size without focal abnormality. Adrenals/Urinary Tract: No adrenal nodules. Atrophic kidneys containing multifocal bilateral cysts, largest arising exophytically from the left interpolar kidney measuring 9.8 cm (4:42). No suspicious renal mass, calculi or hydronephrosis. Decompressed urinary bladder. Stomach/Bowel: Partially imaged mildly patulous lower esophagus containing a few nodular densities measuring up to 8 mm (4:17). Normal appearance of the stomach. No evidence of bowel wall thickening, distention, or inflammatory changes. Moderate volume stool within the rectum. Sigmoid colon is located within the right  lower quadrant. Small volume stool within the upstream colon. Appy normal appendix. Appendix is not discretely seen. Vascular/Lymphatic: Aortic atherosclerosis. No enlarged abdominal or pelvic lymph nodes. Reproductive: No adnexal masses. Other: No free fluid, fluid collection, or free air. Musculoskeletal: Increased height loss of L5 compression fracture. Unchanged subtle rounded sclerotic focus within the left iliac (4:70). Multilevel degenerative changes of the partially imaged thoracic and lumbar spine. Left lateral flank subcutaneous soft tissue edema. IMPRESSION: 1. Moderate volume stool within the rectum may represent fecal impaction. Small volume stool within the upstream colon. No abnormal bowel dilation. 2. Partially imaged mildly patulous lower esophagus containing a few nodular densities measuring up to 8 mm. Consider correlation with endoscopy. 3. Increased height loss of L5 compression fracture. 4. Trace bilateral pleural effusions. 5.  Aortic Atherosclerosis (ICD10-I70.0). Electronically Signed   By: Limin  Xu M.D.   On: 12/17/2024 16:29   DG Abdomen 1 View Result Date: 12/17/2024 CLINICAL DATA:  Constipation, vomiting EXAM: ABDOMEN - 1 VIEW COMPARISON:  November 25, 2024 FINDINGS: The bowel gas pattern is normal. Mild amount of stool is seen in the colon and rectum. No radio-opaque calculi or other significant radiographic abnormality are seen. IMPRESSION: No abnormal bowel dilatation. Mild stool burden. Electronically Signed   By: Lynwood Landy Raddle M.D.   On: 12/17/2024 12:46   DG Chest 2 View Result Date: 12/17/2024 CLINICAL DATA:  Cough EXAM: CHEST - 2 VIEW COMPARISON:  November 25, 2024 FINDINGS: Stable cardiomegaly. Possible left retrocardiac opacity concerning for atelectasis or infiltrate and possible effusion. Bony thorax is unremarkable. IMPRESSION: Possible left retrocardiac opacity as described above. Electronically Signed   By: Lynwood Landy Raddle M.D.   On: 12/17/2024 12:45     Scheduled Meds:  allopurinol   100 mg Oral Daily   apixaban   2.5 mg Oral BID   doxycycline   100 mg Oral Q12H   droxidopa   200 mg Oral TID with meals   ezetimibe   10 mg Oral Daily   fludrocortisone   0.2 mg Oral BID   insulin  aspart  0-20 Units Subcutaneous TID WC   lactulose   20 g Oral TID   lidocaine   1 patch Transdermal Q24H   lidocaine   1 Application Topical BID   midodrine   10 mg Oral TID PC   pantoprazole   40 mg Oral Daily   predniSONE   40 mg Oral Q breakfast   rosuvastatin   10 mg Oral Daily   senna  1 tablet Oral BID   umeclidinium-vilanterol  1 puff Inhalation Daily   Continuous Infusions:  cefTRIAXone  (ROCEPHIN )  IV       Unresulted Labs (From admission, onward)     Start     Ordered   12/17/24 1801  Hepatitis B surface antibody,quantitative  (New Admission Hemo Labs (Hepatitis B))  Once,   R        12/17/24 1801   12/17/24 1748  Strep pneumoniae urinary antigen  (COPD / Pneumonia / Cellulitis / Lower Extremity Wound (Diabetic Foot Infection))  Once,   R        12/17/24 1756   12/17/24 1748  Legionella Pneumophila Serogp 1 Ur Ag  (COPD / Pneumonia / Cellulitis / Lower Extremity Wound (Diabetic Foot Infection))  Once,   R        12/17/24 1756   Signed and Held  Renal function panel  Once,   R        Signed and Held   Signed and Held  CBC  Once,   R        Signed and Held             LOS:  LOS: 0 days   Time Spent: 45 minutes  Sherrelle Prochazka Al-Sultani, MD [facility]  If 7PM-7AM, please contact night-coverage  12/18/2024, 11:56 AM      "

## 2024-12-18 NOTE — TOC Progression Note (Signed)
 Transition of Care [facility]) - Progression Note    Patient Details  Name: Jacqueline [name] MRN: [id number] Date of Birth: 22-Oct-1946  Transition of Care [facility]) CM/SW Contact  Delphine KANDICE Bring, RN Phone Number: 12/18/2024, 11:31 AM  Clinical Narrative:     Patient states she is a long tern resident at [facility]. Cm confirmed with  Montie who is covering for Tammy                    Expected Discharge Plan and Services                                               Social Drivers of Health (SDOH) Interventions SDOH Screenings   Food Insecurity: No Food Insecurity (12/18/2024)  Housing: Low Risk (12/18/2024)  Transportation Needs: No Transportation Needs (12/18/2024)  Utilities: Not At Risk (12/18/2024)  Social Connections: Moderately Isolated (12/18/2024)  Tobacco Use: Medium Risk (12/18/2024)    Readmission Risk Interventions    05/22/2022   12:26 PM  Readmission Risk Prevention Plan  Transportation Screening Complete  PCP or Specialist Appt within 3-5 Days Complete  HRI or Home Care Consult Complete  Social Work Consult for Recovery Care Planning/Counseling Complete  Palliative Care Screening Complete  Medication Review Oceanographer) Complete

## 2024-12-18 NOTE — Progress Notes (Signed)
 Hemodialysis Note:  Received patient in bed to unit. Alert and oriented. Informed consent singed and in chart.  Treatment initiated: 1310 Treatment completed: 1615  Access used: Left AVF Access issues: None  Patient tolerated well. Transported back to room, alert without acute distress. Report given to patient's RN.  Total UF removed: 2 Liters  Medications given: None  Post HD weight: 97 Kg  [facility]

## 2024-12-18 NOTE — Plan of Care (Signed)

## 2024-12-19 ENCOUNTER — Inpatient Hospital Stay

## 2024-12-19 DIAGNOSIS — N186 End stage renal disease: Secondary | ICD-10-CM | POA: Diagnosis not present

## 2024-12-19 DIAGNOSIS — J189 Pneumonia, unspecified organism: Secondary | ICD-10-CM | POA: Diagnosis not present

## 2024-12-19 DIAGNOSIS — J9611 Chronic respiratory failure with hypoxia: Secondary | ICD-10-CM | POA: Diagnosis not present

## 2024-12-19 DIAGNOSIS — E1122 Type 2 diabetes mellitus with diabetic chronic kidney disease: Secondary | ICD-10-CM | POA: Diagnosis not present

## 2024-12-19 DIAGNOSIS — J101 Influenza due to other identified influenza virus with other respiratory manifestations: Secondary | ICD-10-CM | POA: Diagnosis not present

## 2024-12-19 DIAGNOSIS — K5903 Drug induced constipation: Secondary | ICD-10-CM | POA: Diagnosis not present

## 2024-12-19 LAB — CBC
HCT: 35.2 % — ABNORMAL LOW (ref 36.0–46.0)
Hemoglobin: 10.9 g/dL — ABNORMAL LOW (ref 12.0–15.0)
MCH: 31.2 pg (ref 26.0–34.0)
MCHC: 31 g/dL (ref 30.0–36.0)
MCV: 100.9 fL — ABNORMAL HIGH (ref 80.0–100.0)
Platelets: 113 K/uL — ABNORMAL LOW (ref 150–400)
RBC: 3.49 MIL/uL — ABNORMAL LOW (ref 3.87–5.11)
RDW: 18.9 % — ABNORMAL HIGH (ref 11.5–15.5)
WBC: 8.7 K/uL (ref 4.0–10.5)
nRBC: 0 % (ref 0.0–0.2)

## 2024-12-19 LAB — RENAL FUNCTION PANEL
Albumin: 3.7 g/dL (ref 3.5–5.0)
Anion gap: 16 — ABNORMAL HIGH (ref 5–15)
BUN: 35 mg/dL — ABNORMAL HIGH (ref 8–23)
CO2: 27 mmol/L (ref 22–32)
Calcium: 7.9 mg/dL — ABNORMAL LOW (ref 8.9–10.3)
Chloride: 93 mmol/L — ABNORMAL LOW (ref 98–111)
Creatinine, Ser: 5.31 mg/dL — ABNORMAL HIGH (ref 0.44–1.00)
GFR, Estimated: 8 mL/min — ABNORMAL LOW
Glucose, Bld: 150 mg/dL — ABNORMAL HIGH (ref 70–99)
Phosphorus: 2.4 mg/dL — ABNORMAL LOW (ref 2.5–4.6)
Potassium: 4 mmol/L (ref 3.5–5.1)
Sodium: 136 mmol/L (ref 135–145)

## 2024-12-19 LAB — HEPATITIS B SURFACE ANTIBODY, QUANTITATIVE: Hep B S AB Quant (Post): 51.3 m[IU]/mL

## 2024-12-19 LAB — GLUCOSE, CAPILLARY
Glucose-Capillary: 145 mg/dL — ABNORMAL HIGH (ref 70–99)
Glucose-Capillary: 148 mg/dL — ABNORMAL HIGH (ref 70–99)
Glucose-Capillary: 85 mg/dL (ref 70–99)
Glucose-Capillary: 120 mg/dL — ABNORMAL HIGH (ref 70–99)

## 2024-12-19 LAB — LACTIC ACID, PLASMA
Lactic Acid, Venous: 1.5 mmol/L (ref 0.5–1.9)
Lactic Acid, Venous: 1.2 mmol/L (ref 0.5–1.9)

## 2024-12-19 LAB — AMMONIA: Ammonia: 29 umol/L (ref 9–35)

## 2024-12-19 MED ORDER — SMOG ENEMA
960.0000 mL | Freq: Once | RECTAL | Status: AC
  Administered 2024-12-19: 960 mL via RECTAL
  Filled 2024-12-19: qty 960

## 2024-12-19 MED ORDER — APIXABAN 2.5 MG PO TABS
2.5000 mg | ORAL_TABLET | Freq: Two times a day (BID) | ORAL | Status: DC
  Administered 2024-12-20 – 2024-12-24 (×9): 2.5 mg via ORAL
  Filled 2024-12-19 (×10): qty 1

## 2024-12-19 MED ORDER — GUAIFENESIN-DM 100-10 MG/5ML PO SYRP
5.0000 mL | ORAL_SOLUTION | ORAL | Status: DC | PRN
  Administered 2024-12-21: 5 mL via ORAL
  Filled 2024-12-19: qty 10

## 2024-12-19 MED ORDER — OXYCODONE HCL 5 MG PO TABS: 5.0000 mg | ORAL_TABLET | Freq: Four times a day (QID) | ORAL | Status: DC | PRN

## 2024-12-19 NOTE — Progress Notes (Signed)
 " PROGRESS NOTE    Jacqueline [name]  FMW:[id number] DOB: February 03, 1946 DOA: 12/17/2024 PCP: Austin Candyce Stall, MD    Brief Narrative:  79 y.o. female with medical history significant for A-fib on Eliquis , ESRD on HD (TTS), T2DM, HFpEF (LVEF 60-65%), COPD on chronic 1 L O2, anemia of CKD, HTN, CAD s/p DES to RCA (12/2016), GERD, HLD, morbid obesity who presented to the ED from [facility] on 12/18/2023 due to abdominal pain, constipation, shortness of breath, and cough, with concern for sigmoid volvulus on imaging.  Collateral obtained from staff at [facility], reported patient was complaining of significant abdominal pain with abdominal x-ray concerning for sigmoid volvulus.  They noted patient has been constipated with last documented bowel movement on 1/14 noted to be small and hard.  Further history obtained from the patient and her son, [name], over the phone, indicating the patient had been also complaining of 3 days of cough productive of clear sputum and shortness of breath, generally feeling unwell and missing her HD session on Thursday, which was deviation from her normal strict compliance with her HD sessions.    In the ED, she was afebrile with a temp of 98.5 F, HR 79, RR 20, BP 110/55, SpO2 96-100% on 2 L [place].  CBC showed no leukocytosis, Hgb 10.3, thrombocytopenia to 108.  CMP without acute abnormalities.  4 Plex PCR positive for influenza A.  proBNP elevated to >35,000.  High-sensitivity troponin 165 > 161. EKG showed A-fib with incomplete RBBB and ventricular rate of 86 without acute ST segment changes. CXR showed stable cardiomegaly with possible left retrocardiac opacity concerning for atelectasis versus infiltrate and possible effusion. Abdominal x-ray showed no abnormal bowel dilation and mild stool burden CT abdomen pelvis without contrast showed moderate volume stool within the rectum possibly presenting fecal impaction with small volume within the upstream colon and no abnormal  bowel dilation.  Noted partially imaged mildly patulous lower esophagus containing a few nodular densities measuring up to 8 mm.  Increased height loss of L5 compression fracture.  Trace bilateral pleural effusions.   She was admitted for shortness of breath in the setting of recently missed dialysis with influenza A and concern for superimposed pneumonia as well as constipation.  Assessment and Plan:  Influenza A infection with possible superimposed pneumonia Acute exacerbation of COPD - Presenting with 2-3 days of cough productive of clear sputum and increased SOB. End expiratory wheezing noted on exam bilaterally. - No leukocytosis, afebrile.  - CXR showed possible left retrocardiac opacity concerning for atelectasis versus infiltrate  - Procalcitonin elevated to 1.01 - IV Rocephin  and PO doxycycline  for 5 days - Tamiflu  30 mg every other day for 3 days - Received IV Solumedrol in the ED. Continue prednisone  40 mg for 5 days  - PRN DuoNebs - Continue home Anoro Ellipta  - Flutter valve   Acute metabolic encephalopathy - Patient acutely confused today with waxing and waning alertness, also with some delirious thoughts. When awakened, drowsy but oriented x 4. However, not at baseline at presentation, corroborated with son. - Possibly hospital delirium?  - Received oxycodone  5 mg around 6 AM but is on Norco 5-325 mg at home - Ammonia wnl - Lactic acid 1.5 - Ordered CT head noncon - Delirium precautions  Constipation  Fecal impaction Diffuse abdominal pain  Nausea/Vomiting - Presented with concern for sigmoid volvulus on outside KUB. Patient had been complaining of diffuse abdominal pain, with nausea and inability to keep down PO intake, sensation  of fullness, and no bowel movements for several days. She is passing gas. Per [facility] staff, last recorded BM on 1/14 noted to be small and hard - Suspect abdominal pain and N/V secondary to fecal impaction/constipation - Suspect  constipation secondary to opioids and PO Auryxia   - CT abdomen pelvis showed no evidence of volvulus, noted moderate volume stool within rectum and small volume stool within upstream colon - Per [facility] staff, she is on Lactulose  20 g BID, senna x2 nightly, and PRN sorbitol  - Received mineral oil enema in ED - Lactulose  20 g TID, Senna BID, PRN Dulcolax suppository - Ordered SMOG enema - Consider GoLytely  if no significant relief   Chronic hypoxic respiratory failure - Uses 1L [place] at home (order appears to be for 2 L/min) - Placed on 2L  with SpO2 96-100% and no documented desaturation, do not suspect acute hypoxia at this time - Now on 1L O2 with SpO2 100% - Goal SpO2 88-92%   Volume overload Bilateral pleural effusions, trace - ProBNP elevated to > 35,000 - CT abd/pelvis showed trace bilateral pleural effusions - Management with HD   ESRD on HD - TTS schedule, last HD on Tuesday, missed Thursday session - Nephrology consulted for HD   Type II MI - hsTroponins elevated to 165 > 161 - Denies chest pain - EKG without acute ST-segment changes - Likely secondary to demand ischemia given influenza and possible superimposed pneumonia in the setting of ESRD    L5 compression fracture - Previously noted on CT abd/pelvis in 11/2024 - CT abd/pelvis (1/16) showed increased height loss of L5 compression fracture - Likely contributing referred pain to abdominal area - PRN oxycodone  - Lidocaine  patch - Would benefit from outpatient referral to spine and consideration of kyphoplasty if indicated   Patulous esophagus - CT abdomen/pelvis showed mildly patulous lower esophagus containing a few nodular densities measuring up to 8 mm with recommendation for correlation with endoscopy - GI consulted, Dr. Therisa evaluated patient, indicated esophageal CT findings possibly food particles vs neoplasm, recommended barium swallow to rule out achalasia prior to discharge, then outpatient follow  up if inconclusive for consideration of EGD   Thrombocytopenia - Unclear etiology - Monitor   T2DM - Hgb A1c (06/2024) 6.0 - Repeat A1c ordered - On SSI only at facility, reportedly refuses correctional insulin  often if BG < 200 - Start SSI 0 - 20 AC - ACHS Accu Checks   Permanent AFib - Currently rate-controlled - Not on rate control agents - Continue Eliquis  2.5 mg twice daily   Chronic hypotension - Continue home Florinef  0.2 mg twice daily, midodrine  10 mg 3 times daily, droxidopa  200 mg 3 times daily   GERD - Continue Protonix  40 mg daily   Gout - Continue home allopurinol  100 mg daily   HLD - Continue home Crestor  10 mg daily   CAD s/p DES to RCA - Continue Eliquis  and Crestor    Morbid Obesity (BMI Body mass index is 40.89 kg/m. kg/m) - Obesity is clinically significant and complicates care - Recommend continued discussion with PCP re: lifestyle modification including dietary changes, regular physical activity, and weight reduction strategies.   Scheduled Meds:  allopurinol   100 mg Oral Daily   apixaban   2.5 mg Oral BID   doxycycline   100 mg Oral Q12H   droxidopa   200 mg Oral TID with meals   ezetimibe   10 mg Oral Daily   fludrocortisone   0.2 mg Oral BID   insulin  aspart  0-20 Units Subcutaneous TID WC   lactulose   20 g Oral TID   lidocaine   1 patch Transdermal Q24H   lidocaine   1 Application Topical BID   midodrine   10 mg Oral TID PC   pantoprazole   40 mg Oral Daily   predniSONE   40 mg Oral Q breakfast   rosuvastatin   10 mg Oral Daily   senna  1 tablet Oral BID   SMOG  960 mL Rectal Once   umeclidinium-vilanterol  1 puff Inhalation Daily   Continuous Infusions:  cefTRIAXone  (ROCEPHIN )  IV 200 mL/hr at 12/19/24 1443   PRN Meds:.acetaminophen  **OR** [DISCONTINUED] acetaminophen , bisacodyl , guaiFENesin -dextromethorphan , ipratropium-albuterol , naloxone , ondansetron  **OR** ondansetron  (ZOFRAN ) IV, oxyCODONE   Current Outpatient Medications  Medication  Instructions   acetaminophen  (TYLENOL ) 650 mg, Oral, Every 8 hours, (0600, 1400 & 2200)   albuterol  (PROVENTIL ) 2.5 mg, Nebulization, Every 6 hours PRN   allopurinol  (ZYLOPRIM ) 100 mg, Oral, Daily   aluminum -magnesium  hydroxide-simethicone  (MAALOX) 200-200-20 MG/5ML SUSP 30 mLs, Oral, Every 4 hours PRN   apixaban  (ELIQUIS ) 2.5 mg, Oral, 2 times daily   benzonatate  (TESSALON ) 100 mg, Oral, 3 times daily PRN   bisacodyl  (DULCOLAX) 10 mg, Rectal, Daily PRN   cinacalcet  (SENSIPAR ) 30 mg, Oral, Daily with supper, (1800)   clotrimazole-betamethasone (LOTRISONE) cream 1 Application, Topical, See admin instructions, Apply as needed to affected area of ear for itching/irritation.    dextromethorphan -guaiFENesin  (MUCINEX  DM) 30-600 MG 12hr tablet 1 tablet, 2 times daily   Droxidopa  200 mg, Oral, 3 times daily with meals   ezetimibe  (ZETIA ) 10 mg, Oral, Daily   ferric citrate  (AURYXIA ) 420 mg, Oral, 3 times daily with meals   fexofenadine (ALLEGRA) 180 mg, Oral, Every morning, (0800)   fludrocortisone  (FLORINEF ) 0.2 mg, Oral, 2 times daily   fluticasone (FLONASE) 50 MCG/ACT nasal spray 1 spray, Each Nare, Every morning, (0900)   guaiFENesin -dextromethorphan  (ROBITUSSIN DM) 100-10 MG/5ML syrup 15 mLs, Oral, Every 4 hours PRN   HYDROcodone -acetaminophen  (NORCO/VICODIN) 5-325 MG tablet 1-2 tablets, Oral, Every 6 hours PRN   insulin  aspart (NOVOLOG ) 0-20 Units, Subcutaneous, 3 times daily with meals, For CBG 70-120: 0 units For CBG 121-150: 3 units For CBG 151-200: 4 units For CBG 201-250: 7 units For CBG 251-300: 11 units For CBG 301-350: 15 units For CBG 351-400: 20 units For CBG > 400: 25 units and call your doctor   ipratropium-albuterol  (DUONEB) 0.5-2.5 (3) MG/3ML SOLN 3 mLs, Nebulization, 2 times daily   lactulose  (CHRONULAC ) 20 g, Oral, 2 times daily   lidocaine  (LIDODERM ) 5 % 1 patch, Transdermal, Daily   Lidocaine  HCl (ASPERCREME LIDOCAINE ) 4 % CREA 1 Application, Topical, 2 times daily,  Applied to bilateral knees   magnesium  hydroxide (MILK OF MAGNESIA) 400 MG/5ML suspension 30 mLs, Oral, Daily PRN   Menthol -Zinc  Oxide (MOISTURE BARRIER EX) 1 Application, Topical, As directed, Apply barrier cream to buttocks every shift   midodrine  (PROAMATINE ) 10 mg, Oral, 3 times daily   Multiple Vitamin (MULTIVITAMIN WITH MINERALS) TABS tablet 1 tablet, Oral, Daily at bedtime   naloxone  (NARCAN ) nasal spray 4 mg/0.1 mL 1 spray, Nasal, 3 times daily PRN   nitroGLYCERIN  (NITROSTAT ) 0.4 mg, Sublingual, Every 5 min PRN   ondansetron  (ZOFRAN -ODT) 4 mg, Oral, Every 8 hours PRN   oseltamivir  (TAMIFLU ) 30 mg, 2 times daily   OXYGEN  2 L/min, Inhalation, As needed   pantoprazole  (PROTONIX ) 40 mg, Oral, Daily   polyethylene glycol (MIRALAX  / GLYCOLAX ) 17 g, Oral, Daily   promethazine -dextromethorphan  (PROMETHAZINE -DM) 6.25-15 MG/5ML syrup 5  mLs, Oral, 3 times daily   rosuvastatin  (CRESTOR ) 10 mg, Oral, Daily   senna-docusate (SENOKOT-S) 8.6-50 MG tablet 2 tablets, Oral, Daily at bedtime   simethicone  (MYLICON) 80 mg, Oral, Daily PRN   sodium phosphate (FLEET) ENEM 1 enema, Rectal, Daily PRN   sorbitol  70 % solution 30 mLs, Oral, Daily PRN   umeclidinium-vilanterol (ANORO ELLIPTA ) 62.5-25 MCG/ACT AEPB 1 puff, Daily    DVT prophylaxis: apixaban  (ELIQUIS ) tablet 2.5 mg Start: 12/17/24 2200 apixaban  (ELIQUIS ) tablet 2.5 mg   Code Status:   Code Status: Full Code  Family Communication: Son [name] at bedside  Disposition Plan: Back to [facility] when clinically improved PT -   -   OT -   -    Level of care: Telemetry  Consultants:  Nephrology, GI  Antimicrobials: IV Rocephin , PO Doxy   Subjective: Seen at bedside.  Patient's son [name] at bedside 2.  Reports patient has been confused and sleepy today. States she made some nonsensical remarks. Room was dark with lights off and blinds closed. Instructed patient's son to keep lights on and keep blinds open during the day, keep patient  engaged as her mental status improved once woken up. Upon engaging with patient, she is alert and oriented x 4, but then makes unusual remarks and appears drowsy, falls back asleep as soon as I begin to speak with her son.   Objective: Vitals:   12/18/24 1743 12/18/24 2026 12/19/24 0316 12/19/24 0809  BP: (!) 117/58 (!) 118/59 122/67 (!) 124/57  Pulse: 73 70 73 79  Resp: 18 17  18   Temp: 97.8 F (36.6 C) 97.8 F (36.6 C) 97.8 F (36.6 C) 97.8 F (36.6 C)  TempSrc: Oral Oral Oral Oral  SpO2: 100% 99% 98% 100%  Weight:      Height:        Intake/Output Summary (Last 24 hours) at 12/19/2024 1500 Last data filed at 12/19/2024 1443 Gross per 24 hour  Intake 440 ml  Output 2000 ml  Net -1560 ml   Filed Weights   12/18/24 0041 12/18/24 1305 12/18/24 1615  Weight: 99 kg 99 kg 97 kg    Examination:  Gen: elderly female, NAD HEENT: NCAT Neck: Supple CV: RRR, no murmurs Resp: normal WOB, bilateral diffuse end-expiratory wheezing and bibasilar crackles Abd: Soft, generalized tenderness to palpation, nondistended, no guarding, BS normoactive Ext: 1+ LE edema, pulses 2+ b/l, LUE AVF Skin: Warm, dry Neuro:  Lethargic but awakens to voice, oriented x 4, no focal motor or sensory deficits on exam but son notes change in speech, question of dysarthria versus just drowsiness Psych: Calm, cooperative   Data Reviewed: I have personally reviewed following labs and imaging studies  CBC: Recent Labs  Lab 12/17/24 1140 12/17/24 2105 12/18/24 0111 12/19/24 0852  WBC 6.1 6.6 7.4 8.7  NEUTROABS 5.6  --   --   --   HGB 10.3* 10.1* 10.8* 10.9*  HCT 33.2* 33.2* 34.9* 35.2*  MCV 103.4* 102.8* 101.5* 100.9*  PLT 108* 131* 122* 113*   Basic Metabolic Panel: Recent Labs  Lab 12/17/24 1140 12/17/24 2105 12/18/24 0111 12/19/24 0852  NA 136  --  137 136  K 4.4  --  4.4 4.0  CL 92*  --  91* 93*  CO2 29  --  24 27  GLUCOSE 181*  --  207* 150*  BUN 33*  --  41* 35*  CREATININE 6.23*  6.50* 6.69* 5.31*  CALCIUM  7.7*  --  7.9* 7.9*  PHOS  --   --   --  2.4*   GFR: Estimated Creatinine Clearance: 9.4 mL/min (A) (by C-G formula based on SCr of 5.31 mg/dL (H)). Liver Function Tests: Recent Labs  Lab 12/17/24 1140 12/19/24 0852  AST 33  --   ALT 13  --   ALKPHOS 181*  --   BILITOT 0.3  --   PROT 6.6  --   ALBUMIN  3.5 3.7   No results for input(s): LIPASE, AMYLASE in the last 168 hours. No results for input(s): AMMONIA in the last 168 hours. Coagulation Profile: No results for input(s): INR, PROTIME in the last 168 hours. Cardiac Enzymes: No results for input(s): CKTOTAL, CKMB, CKMBINDEX, TROPONINI in the last 168 hours. BNP (last 3 results) Recent Labs    12/17/24 1148  PROBNP >35,000.0*   HbA1C: No results for input(s): HGBA1C in the last 72 hours. CBG: Recent Labs  Lab 12/18/24 1116 12/18/24 1743 12/18/24 2155 12/19/24 0811 12/19/24 1135  GLUCAP 174* 118* 145* 145* 148*   Lipid Profile: No results for input(s): CHOL, HDL, LDLCALC, TRIG, CHOLHDL, LDLDIRECT in the last 72 hours. Thyroid  Function Tests: No results for input(s): TSH, T4TOTAL, FREET4, T3FREE, THYROIDAB in the last 72 hours. Anemia Panel: No results for input(s): VITAMINB12, FOLATE, FERRITIN, TIBC, IRON , RETICCTPCT in the last 72 hours. Sepsis Labs: Recent Labs  Lab 12/17/24 2105  PROCALCITON 1.01    Recent Results (from the past 240 hours)  Resp panel by RT-PCR (RSV, Flu A&B, Covid) Anterior Nasal Swab     Status: Abnormal   Collection Time: 12/17/24 11:40 AM   Specimen: Anterior Nasal Swab  Result Value Ref Range Status   SARS Coronavirus 2 by RT PCR NEGATIVE NEGATIVE Final    Comment: (NOTE) SARS-CoV-2 target nucleic acids are NOT DETECTED.  The SARS-CoV-2 RNA is generally detectable in upper respiratory specimens during the acute phase of infection. The lowest concentration of SARS-CoV-2 viral copies this assay can  detect is 138 copies/mL. A negative result does not preclude SARS-Cov-2 infection and should not be used as the sole basis for treatment or other patient management decisions. A negative result may occur with  improper specimen collection/handling, submission of specimen other than nasopharyngeal swab, presence of viral mutation(s) within the areas targeted by this assay, and inadequate number of viral copies(<138 copies/mL). A negative result must be combined with clinical observations, patient history, and epidemiological information. The expected result is Negative.  Fact Sheet for Patients:  bloggercourse.com  Fact Sheet for Healthcare Providers:  seriousbroker.it  This test is no t yet approved or cleared by the [place]  FDA and  has been authorized for detection and/or diagnosis of SARS-CoV-2 by FDA under an Emergency Use Authorization (EUA). This EUA will remain  in effect (meaning this test can be used) for the duration of the COVID-19 declaration under Section 564(b)(1) of the Act, 21 U.S.C.section 360bbb-3(b)(1), unless the authorization is terminated  or revoked sooner.       Influenza A by PCR POSITIVE (A) NEGATIVE Final   Influenza B by PCR NEGATIVE NEGATIVE Final    Comment: (NOTE) The Xpert Xpress SARS-CoV-2/FLU/RSV plus assay is intended as an aid in the diagnosis of influenza from Nasopharyngeal swab specimens and should not be used as a sole basis for treatment. Nasal washings and aspirates are unacceptable for Xpert Xpress SARS-CoV-2/FLU/RSV testing.  Fact Sheet for Patients: bloggercourse.com  Fact Sheet for Healthcare Providers: seriousbroker.it  This test is not yet approved or cleared by the  [place]  FDA and has been authorized for detection and/or diagnosis of SARS-CoV-2 by FDA under an Emergency Use Authorization (EUA). This EUA will  remain in effect (meaning this test can be used) for the duration of the COVID-19 declaration under Section 564(b)(1) of the Act, 21 U.S.C. section 360bbb-3(b)(1), unless the authorization is terminated or revoked.     Resp Syncytial Virus by PCR NEGATIVE NEGATIVE Final    Comment: (NOTE) Fact Sheet for Patients: bloggercourse.com  Fact Sheet for Healthcare Providers: seriousbroker.it  This test is not yet approved or cleared by the [place]  FDA and has been authorized for detection and/or diagnosis of SARS-CoV-2 by FDA under an Emergency Use Authorization (EUA). This EUA will remain in effect (meaning this test can be used) for the duration of the COVID-19 declaration under Section 564(b)(1) of the Act, 21 U.S.C. section 360bbb-3(b)(1), unless the authorization is terminated or revoked.  Performed at [facility], [address] [place], [place] [address]   MRSA Next Gen by PCR, Nasal     Status: None   Collection Time: 12/18/24 12:49 AM   Specimen: Nasal Mucosa; Nasal Swab  Result Value Ref Range Status   MRSA by PCR Next Gen NOT DETECTED NOT DETECTED Final    Comment: (NOTE) The GeneXpert MRSA Assay (FDA approved for NASAL specimens only), is one component of a comprehensive MRSA colonization surveillance program. It is not intended to diagnose MRSA infection nor to guide or monitor treatment for MRSA infections. Test performance is not FDA approved in patients less than 23 years old. Performed at [facility], [address] [place], [place] [address]      Radiology Studies: CT ABDOMEN PELVIS WO CONTRAST Result Date: 12/17/2024 CLINICAL DATA:  Constipation and vomiting EXAM: CT ABDOMEN AND PELVIS WITHOUT CONTRAST TECHNIQUE: Multidetector CT imaging of the abdomen and pelvis was performed following the standard protocol without IV contrast. RADIATION DOSE REDUCTION: This exam was performed  according to the departmental dose-optimization program which includes automated exposure control, adjustment of the mA and/or kV according to patient size and/or use of iterative reconstruction technique. COMPARISON:  Same-day abdominal radiograph, CT abdomen and pelvis dated 11/25/2024 FINDINGS: Lower chest: No focal consolidation or pulmonary nodule in the lung bases. Trace bilateral pleural effusions. Multichamber cardiomegaly. Coronary artery calcifications. Hepatobiliary: No focal hepatic lesions. No intra or extrahepatic biliary ductal dilation. Cholelithiasis. Pancreas: No focal lesions or main ductal dilation. Spleen: Normal in size without focal abnormality. Adrenals/Urinary Tract: No adrenal nodules. Atrophic kidneys containing multifocal bilateral cysts, largest arising exophytically from the left interpolar kidney measuring 9.8 cm (4:42). No suspicious renal mass, calculi or hydronephrosis. Decompressed urinary bladder. Stomach/Bowel: Partially imaged mildly patulous lower esophagus containing a few nodular densities measuring up to 8 mm (4:17). Normal appearance of the stomach. No evidence of bowel wall thickening, distention, or inflammatory changes. Moderate volume stool within the rectum. Sigmoid colon is located within the right lower quadrant. Small volume stool within the upstream colon. Appy normal appendix. Appendix is not discretely seen. Vascular/Lymphatic: Aortic atherosclerosis. No enlarged abdominal or pelvic lymph nodes. Reproductive: No adnexal masses. Other: No free fluid, fluid collection, or free air. Musculoskeletal: Increased height loss of L5 compression fracture. Unchanged subtle rounded sclerotic focus within the left iliac (4:70). Multilevel degenerative changes of the partially imaged thoracic and lumbar spine. Left lateral flank subcutaneous soft tissue edema. IMPRESSION: 1. Moderate volume stool within the rectum may represent fecal impaction. Small volume stool within the  upstream colon. No abnormal  bowel dilation. 2. Partially imaged mildly patulous lower esophagus containing a few nodular densities measuring up to 8 mm. Consider correlation with endoscopy. 3. Increased height loss of L5 compression fracture. 4. Trace bilateral pleural effusions. 5.  Aortic Atherosclerosis (ICD10-I70.0). Electronically Signed   By: Limin  Xu M.D.   On: 12/17/2024 16:29    Scheduled Meds:  allopurinol   100 mg Oral Daily   apixaban   2.5 mg Oral BID   doxycycline   100 mg Oral Q12H   droxidopa   200 mg Oral TID with meals   ezetimibe   10 mg Oral Daily   fludrocortisone   0.2 mg Oral BID   insulin  aspart  0-20 Units Subcutaneous TID WC   lactulose   20 g Oral TID   lidocaine   1 patch Transdermal Q24H   lidocaine   1 Application Topical BID   midodrine   10 mg Oral TID PC   pantoprazole   40 mg Oral Daily   predniSONE   40 mg Oral Q breakfast   rosuvastatin   10 mg Oral Daily   senna  1 tablet Oral BID   SMOG  960 mL Rectal Once   umeclidinium-vilanterol  1 puff Inhalation Daily   Continuous Infusions:  cefTRIAXone  (ROCEPHIN )  IV 200 mL/hr at 12/19/24 1443     Unresulted Labs (From admission, onward)     Start     Ordered   12/19/24 1437  Ammonia  ONCE - URGENT,   TIMED        12/19/24 1436   12/19/24 1437  Lactic acid, plasma  (Lactic Acid)  STAT Now then every 3 hours,   TIMED (with STAT occurrences)      12/19/24 1436   12/17/24 1748  Strep pneumoniae urinary antigen  (COPD / Pneumonia / Cellulitis / Lower Extremity Wound (Diabetic Foot Infection))  Once,   R        12/17/24 1756   12/17/24 1748  Legionella Pneumophila Serogp 1 Ur Ag  (COPD / Pneumonia / Cellulitis / Lower Extremity Wound (Diabetic Foot Infection))  Once,   R        12/17/24 1756   Signed and Held  Renal function panel  Once,   R        Signed and Held   Signed and Held  CBC  Once,   R        Signed and Held             LOS:  LOS: 1 day   Time Spent: 45 minutes  Jacqeline Broers Al-Sultani, MD [facility]  If 7PM-7AM, please contact night-coverage  12/19/2024, 3:00 PM      "

## 2024-12-19 NOTE — Progress Notes (Signed)
 " [facility]  PROGRESS NOTE   Subjective:   Seen at bedside.  Feels much better.  Had stable dialysis yesterday with 2 L of fluid removal.  Objective:  Vital signs: Blood pressure (!) 124/57, pulse 79, temperature 97.8 F (36.6 C), temperature source Oral, resp. rate 18, height 5' 4 (1.626 m), weight 97 kg, SpO2 100%.  Intake/Output Summary (Last 24 hours) at 12/19/2024 1534 Last data filed at 12/19/2024 1443 Gross per 24 hour  Intake 440 ml  Output 2000 ml  Net -1560 ml   Filed Weights   12/18/24 0041 12/18/24 1305 12/18/24 1615  Weight: 99 kg 99 kg 97 kg     Physical Exam: General:  No acute distress  Head:  Normocephalic, atraumatic. Moist oral mucosal membranes  Eyes:  Anicteric  Neck:  Supple  Lungs:   Clear to auscultation, normal effort  Heart:  S1S2 no rubs  Abdomen:   Soft, nontender, bowel sounds present  Extremities:  peripheral edema.  Neurologic:  Awake, alert, following commands  Skin:  No lesions  Access:     Basic Metabolic Panel: Recent Labs  Lab 12/17/24 1140 12/17/24 2105 12/18/24 0111 12/19/24 0852  NA 136  --  137 136  K 4.4  --  4.4 4.0  CL 92*  --  91* 93*  CO2 29  --  24 27  GLUCOSE 181*  --  207* 150*  BUN 33*  --  41* 35*  CREATININE 6.23* 6.50* 6.69* 5.31*  CALCIUM  7.7*  --  7.9* 7.9*  PHOS  --   --   --  2.4*   GFR: Estimated Creatinine Clearance: 9.4 mL/min (A) (by C-G formula based on SCr of 5.31 mg/dL (H)).  Liver Function Tests: Recent Labs  Lab 12/17/24 1140 12/19/24 0852  AST 33  --   ALT 13  --   ALKPHOS 181*  --   BILITOT 0.3  --   PROT 6.6  --   ALBUMIN  3.5 3.7   No results for input(s): LIPASE, AMYLASE in the last 168 hours. No results for input(s): AMMONIA in the last 168 hours.  CBC: Recent Labs  Lab 12/17/24 1140 12/17/24 2105 12/18/24 0111 12/19/24 0852  WBC 6.1 6.6 7.4 8.7  NEUTROABS 5.6  --   --   --   HGB 10.3* 10.1* 10.8* 10.9*  HCT 33.2* 33.2* 34.9* 35.2*  MCV 103.4*  102.8* 101.5* 100.9*  PLT 108* 131* 122* 113*     HbA1C: Hemoglobin A1C  Date/Time Value Ref Range Status  06/12/2021 12:00 AM 6.6  Final   Hgb A1c MFr Bld  Date/Time Value Ref Range Status  06/21/2024 08:00 AM 6.0 (H) 4.8 - 5.6 % Final    Comment:    (NOTE) Diagnosis of Diabetes The following HbA1c ranges recommended by the American Diabetes Association (ADA) may be used as an aid in the diagnosis of diabetes mellitus.  Hemoglobin             Suggested A1C NGSP%              Diagnosis  <5.7                   Non Diabetic  5.7-6.4                Pre-Diabetic  >6.4                   Diabetic  <7.0  Glycemic control for                       adults with diabetes.    06/07/2023 07:44 PM 5.8 (H) 4.8 - 5.6 % Final    Comment:    (NOTE) Pre diabetes:          5.7%-6.4%  Diabetes:              >6.4%  Glycemic control for   <7.0% adults with diabetes     Urinalysis: No results for input(s): COLORURINE, LABSPEC, PHURINE, GLUCOSEU, HGBUR, BILIRUBINUR, KETONESUR, PROTEINUR, UROBILINOGEN, NITRITE, LEUKOCYTESUR in the last 72 hours.  Invalid input(s): APPERANCEUR    Imaging: CT ABDOMEN PELVIS WO CONTRAST Result Date: 12/17/2024 CLINICAL DATA:  Constipation and vomiting EXAM: CT ABDOMEN AND PELVIS WITHOUT CONTRAST TECHNIQUE: Multidetector CT imaging of the abdomen and pelvis was performed following the standard protocol without IV contrast. RADIATION DOSE REDUCTION: This exam was performed according to the departmental dose-optimization program which includes automated exposure control, adjustment of the mA and/or kV according to patient size and/or use of iterative reconstruction technique. COMPARISON:  Same-day abdominal radiograph, CT abdomen and pelvis dated 11/25/2024 FINDINGS: Lower chest: No focal consolidation or pulmonary nodule in the lung bases. Trace bilateral pleural effusions. Multichamber cardiomegaly. Coronary artery  calcifications. Hepatobiliary: No focal hepatic lesions. No intra or extrahepatic biliary ductal dilation. Cholelithiasis. Pancreas: No focal lesions or main ductal dilation. Spleen: Normal in size without focal abnormality. Adrenals/Urinary Tract: No adrenal nodules. Atrophic kidneys containing multifocal bilateral cysts, largest arising exophytically from the left interpolar kidney measuring 9.8 cm (4:42). No suspicious renal mass, calculi or hydronephrosis. Decompressed urinary bladder. Stomach/Bowel: Partially imaged mildly patulous lower esophagus containing a few nodular densities measuring up to 8 mm (4:17). Normal appearance of the stomach. No evidence of bowel wall thickening, distention, or inflammatory changes. Moderate volume stool within the rectum. Sigmoid colon is located within the right lower quadrant. Small volume stool within the upstream colon. Appy normal appendix. Appendix is not discretely seen. Vascular/Lymphatic: Aortic atherosclerosis. No enlarged abdominal or pelvic lymph nodes. Reproductive: No adnexal masses. Other: No free fluid, fluid collection, or free air. Musculoskeletal: Increased height loss of L5 compression fracture. Unchanged subtle rounded sclerotic focus within the left iliac (4:70). Multilevel degenerative changes of the partially imaged thoracic and lumbar spine. Left lateral flank subcutaneous soft tissue edema. IMPRESSION: 1. Moderate volume stool within the rectum may represent fecal impaction. Small volume stool within the upstream colon. No abnormal bowel dilation. 2. Partially imaged mildly patulous lower esophagus containing a few nodular densities measuring up to 8 mm. Consider correlation with endoscopy. 3. Increased height loss of L5 compression fracture. 4. Trace bilateral pleural effusions. 5.  Aortic Atherosclerosis (ICD10-I70.0). Electronically Signed   By: Limin  Xu M.D.   On: 12/17/2024 16:29     Medications:    cefTRIAXone  (ROCEPHIN )  IV 200 mL/hr at  12/19/24 1443    allopurinol   100 mg Oral Daily   apixaban   2.5 mg Oral BID   doxycycline   100 mg Oral Q12H   droxidopa   200 mg Oral TID with meals   ezetimibe   10 mg Oral Daily   fludrocortisone   0.2 mg Oral BID   insulin  aspart  0-20 Units Subcutaneous TID WC   lactulose   20 g Oral TID   lidocaine   1 patch Transdermal Q24H   lidocaine   1 Application Topical BID   midodrine   10 mg Oral TID PC  pantoprazole   40 mg Oral Daily   predniSONE   40 mg Oral Q breakfast   rosuvastatin   10 mg Oral Daily   senna  1 tablet Oral BID   SMOG  960 mL Rectal Once   umeclidinium-vilanterol  1 puff Inhalation Daily    Assessment/ Plan:     79 year old female with history of hypertension, coronary artery disease, diabetes, COPD, atrial fibrillation, end-stage renal disease on hemodialysis on Tuesday Thursday Saturday schedule.  She is now admitted with history of constipation and also respiratory illness.  She was found to have influenza A and is being treated for the same.   #1: ESRD: Had stable dialysis yesterday with 2 L of fluid removed.  Her next dialysis is scheduled for Tuesday.     #2: Anemia: Will continue to monitor closely.   #3: Secondary hyperparathyroidism: Will check her PTH, calcium  and phosphorus levels.   #4:  Influenza A/COPD/pneumonia: Patient was started on oxygen  along with prednisone  and Rocephin  with doxycycline .    Labs and medications reviewed. Will continue to follow along with you.   LOS: 1 Pinkey Edman, MD [facility] 1/18/20263:34 PM  "

## 2024-12-20 ENCOUNTER — Inpatient Hospital Stay

## 2024-12-20 DIAGNOSIS — K5903 Drug induced constipation: Secondary | ICD-10-CM | POA: Diagnosis not present

## 2024-12-20 DIAGNOSIS — J101 Influenza due to other identified influenza virus with other respiratory manifestations: Secondary | ICD-10-CM | POA: Diagnosis not present

## 2024-12-20 DIAGNOSIS — J189 Pneumonia, unspecified organism: Secondary | ICD-10-CM | POA: Diagnosis not present

## 2024-12-20 DIAGNOSIS — J9611 Chronic respiratory failure with hypoxia: Secondary | ICD-10-CM | POA: Diagnosis not present

## 2024-12-20 LAB — GLUCOSE, CAPILLARY
Glucose-Capillary: 100 mg/dL — ABNORMAL HIGH (ref 70–99)
Glucose-Capillary: 105 mg/dL — ABNORMAL HIGH (ref 70–99)
Glucose-Capillary: 138 mg/dL — ABNORMAL HIGH (ref 70–99)
Glucose-Capillary: 168 mg/dL — ABNORMAL HIGH (ref 70–99)

## 2024-12-20 LAB — RENAL FUNCTION PANEL
Albumin: 3.4 g/dL — ABNORMAL LOW (ref 3.5–5.0)
Anion gap: 16 — ABNORMAL HIGH (ref 5–15)
BUN: 46 mg/dL — ABNORMAL HIGH (ref 8–23)
CO2: 28 mmol/L (ref 22–32)
Calcium: 7.7 mg/dL — ABNORMAL LOW (ref 8.9–10.3)
Chloride: 93 mmol/L — ABNORMAL LOW (ref 98–111)
Creatinine, Ser: 6.34 mg/dL — ABNORMAL HIGH (ref 0.44–1.00)
GFR, Estimated: 6 mL/min — ABNORMAL LOW
Glucose, Bld: 106 mg/dL — ABNORMAL HIGH (ref 70–99)
Phosphorus: 2.8 mg/dL (ref 2.5–4.6)
Potassium: 4.3 mmol/L (ref 3.5–5.1)
Sodium: 137 mmol/L (ref 135–145)

## 2024-12-20 LAB — CBC
HCT: 34.3 % — ABNORMAL LOW (ref 36.0–46.0)
Hemoglobin: 10.8 g/dL — ABNORMAL LOW (ref 12.0–15.0)
MCH: 31.5 pg (ref 26.0–34.0)
MCHC: 31.5 g/dL (ref 30.0–36.0)
MCV: 100 fL (ref 80.0–100.0)
Platelets: 123 K/uL — ABNORMAL LOW (ref 150–400)
RBC: 3.43 MIL/uL — ABNORMAL LOW (ref 3.87–5.11)
RDW: 18.7 % — ABNORMAL HIGH (ref 11.5–15.5)
WBC: 8.6 K/uL (ref 4.0–10.5)
nRBC: 0 % (ref 0.0–0.2)

## 2024-12-20 MED ORDER — NEPRO/CARBSTEADY PO LIQD
237.0000 mL | Freq: Two times a day (BID) | ORAL | Status: DC
  Administered 2024-12-21: 237 mL via ORAL

## 2024-12-20 MED ORDER — RENA-VITE PO TABS
1.0000 | ORAL_TABLET | Freq: Every day | ORAL | Status: DC
  Administered 2024-12-20 – 2024-12-23 (×3): 1 via ORAL
  Filled 2024-12-20 (×3): qty 1

## 2024-12-20 MED ORDER — PANTOPRAZOLE SODIUM 40 MG PO TBEC
40.0000 mg | DELAYED_RELEASE_TABLET | Freq: Two times a day (BID) | ORAL | Status: DC
  Administered 2024-12-20 – 2024-12-24 (×8): 40 mg via ORAL
  Filled 2024-12-20 (×8): qty 1

## 2024-12-20 NOTE — Progress Notes (Signed)
 Pt receives outpt HD at [facility] on TTS at 7:00am. Navigator following to assist with any HD needs.  Suzen Satchel Dialysis Navigator [phone]

## 2024-12-20 NOTE — Progress Notes (Signed)
 " [facility]  PROGRESS NOTE   Subjective:   Patient seen sitting up in bed Family at bedside Partially completed breakfast tray at bedside Placed on 2 L nasal cannula, 1 L at baseline   Objective:  Vital signs: Blood pressure (!) 88/69, pulse 69, temperature 98 F (36.7 C), temperature source Oral, resp. rate 18, height 5' 4 (1.626 m), weight 97 kg, SpO2 97%.  Intake/Output Summary (Last 24 hours) at 12/20/2024 1540 Last data filed at 12/19/2024 1700 Gross per 24 hour  Intake 240 ml  Output --  Net 240 ml   Filed Weights   12/18/24 0041 12/18/24 1305 12/18/24 1615  Weight: 99 kg 99 kg 97 kg     Physical Exam: General:  No acute distress  Head:  Normocephalic, atraumatic. Moist oral mucosal membranes  Eyes:  Anicteric  Lungs:   Clear to auscultation, normal effort  Heart:  S1S2 no rubs  Abdomen:   Soft, nontender, bowel sounds present  Extremities: No peripheral edema.  Neurologic:  Awake, alert, following commands  Skin:  No lesions  Access: Left aVF    Basic Metabolic Panel: Recent Labs  Lab 12/17/24 1140 12/17/24 2105 12/18/24 0111 12/19/24 0852 12/20/24 0934  NA 136  --  137 136 137  K 4.4  --  4.4 4.0 4.3  CL 92*  --  91* 93* 93*  CO2 29  --  24 27 28   GLUCOSE 181*  --  207* 150* 106*  BUN 33*  --  41* 35* 46*  CREATININE 6.23* 6.50* 6.69* 5.31* 6.34*  CALCIUM  7.7*  --  7.9* 7.9* 7.7*  PHOS  --   --   --  2.4* 2.8   GFR: Estimated Creatinine Clearance: 7.9 mL/min (A) (by C-G formula based on SCr of 6.34 mg/dL (H)).  Liver Function Tests: Recent Labs  Lab 12/17/24 1140 12/19/24 0852 12/20/24 0934  AST 33  --   --   ALT 13  --   --   ALKPHOS 181*  --   --   BILITOT 0.3  --   --   PROT 6.6  --   --   ALBUMIN  3.5 3.7 3.4*   No results for input(s): LIPASE, AMYLASE in the last 168 hours. Recent Labs  Lab 12/19/24 1538  AMMONIA 29    CBC: Recent Labs  Lab 12/17/24 1140 12/17/24 2105 12/18/24 0111 12/19/24 0852  12/20/24 0934  WBC 6.1 6.6 7.4 8.7 8.6  NEUTROABS 5.6  --   --   --   --   HGB 10.3* 10.1* 10.8* 10.9* 10.8*  HCT 33.2* 33.2* 34.9* 35.2* 34.3*  MCV 103.4* 102.8* 101.5* 100.9* 100.0  PLT 108* 131* 122* 113* 123*     HbA1C: Hemoglobin A1C  Date/Time Value Ref Range Status  06/12/2021 12:00 AM 6.6  Final   Hgb A1c MFr Bld  Date/Time Value Ref Range Status  06/21/2024 08:00 AM 6.0 (H) 4.8 - 5.6 % Final    Comment:    (NOTE) Diagnosis of Diabetes The following HbA1c ranges recommended by the American Diabetes Association (ADA) may be used as an aid in the diagnosis of diabetes mellitus.  Hemoglobin             Suggested A1C NGSP%              Diagnosis  <5.7                   Non Diabetic  5.7-6.4  Pre-Diabetic  >6.4                   Diabetic  <7.0                   Glycemic control for                       adults with diabetes.    06/07/2023 07:44 PM 5.8 (H) 4.8 - 5.6 % Final    Comment:    (NOTE) Pre diabetes:          5.7%-6.4%  Diabetes:              >6.4%  Glycemic control for   <7.0% adults with diabetes     Urinalysis: No results for input(s): COLORURINE, LABSPEC, PHURINE, GLUCOSEU, HGBUR, BILIRUBINUR, KETONESUR, PROTEINUR, UROBILINOGEN, NITRITE, LEUKOCYTESUR in the last 72 hours.  Invalid input(s): APPERANCEUR    Imaging: DG Chest Port 1 View Result Date: 12/20/2024 CLINICAL DATA:  High risk for aspiration. EXAM: PORTABLE CHEST 1 VIEW COMPARISON:  12/17/2024 and CT chest 06/21/2024. FINDINGS: Trachea is midline. Heart is enlarged. Left perihilar consolidation with streaky opacification in the lower lobes. Small loculated left pleural effusion. IMPRESSION: 1. Left perihilar consolidation may be due to pneumonia. Difficult to exclude a mass. Consider CT chest with contrast in further evaluation, as clinically indicated. 2. Streaky bibasilar atelectasis. 3. Small loculated left pleural effusion. Electronically Signed    By: Newell Eke M.D.   On: 12/20/2024 10:31   CT HEAD WO CONTRAST ( ) Result Date: 12/19/2024 EXAM: CT HEAD WITHOUT CONTRAST 12/19/2024 03:12:00 PM TECHNIQUE: CT of the head was performed without the administration of intravenous contrast. Automated exposure control, iterative reconstruction, and/or weight based adjustment of the mA/kV was utilized to reduce the radiation dose to as low as reasonably achievable. COMPARISON: 06/21/2024 CLINICAL HISTORY: Altered mental status, nontraumatic FINDINGS: BRAIN AND VENTRICLES: Mild generalized atrophy and white matter changes are stable, potentially within normal limits for age. Atherosclerotic calcifications within the cavernous internal carotid and vertebral arteries. No acute hemorrhage. No evidence of acute infarct. No hydrocephalus. No extra-axial collection. No mass effect or midline shift. ORBITS: Left lens replacement. SINUSES: Bilateral mastoid effusions. Chronic wall thickening is present in the right maxillary sinus. No active disease is present. SOFT TISSUES AND SKULL: No acute soft tissue abnormality. No skull fracture. IMPRESSION: 1. No acute intracranial abnormality or significant interval change . Electronically signed by: Lonni Necessary MD 12/19/2024 06:35 PM EST RP Workstation: [id number]     Medications:    cefTRIAXone  (ROCEPHIN )  IV 1 g (12/20/24 1528)    allopurinol   100 mg Oral Daily   apixaban   2.5 mg Oral BID   doxycycline   100 mg Oral Q12H   droxidopa   200 mg Oral TID with meals   ezetimibe   10 mg Oral Daily   [START ON 12/21/2024] feeding supplement (NEPRO CARB STEADY)  237 mL Oral BID BM   fludrocortisone   0.2 mg Oral BID   insulin  aspart  0-20 Units Subcutaneous TID WC   lactulose   20 g Oral TID   lidocaine   1 patch Transdermal Q24H   lidocaine   1 Application Topical BID   midodrine   10 mg Oral TID PC   multivitamin  1 tablet Oral QHS   pantoprazole   40 mg Oral Daily   predniSONE   40 mg Oral Q breakfast    rosuvastatin   10 mg Oral Daily   senna  1 tablet Oral  BID   umeclidinium-vilanterol  1 puff Inhalation Daily    Assessment/ Plan:     79 year old female with history of hypertension, coronary artery disease, diabetes, COPD, atrial fibrillation, end-stage renal disease on hemodialysis on Tuesday Thursday Saturday schedule.  She is now admitted with history of constipation and also respiratory illness.  She was found to have influenza A and is being treated for the same.  #1: ESRD on hemodialysis: Scheduled to receive dialysis tomorrow.  #2: Anemia with chronic kidney disease: Hemoglobin 10.8, acceptable for renal patient.  No need of ESA at this time.  #3: Secondary hyperparathyroidism: Calcium  7.7 and phosphorus 2.8.  Will continue to monitor bone minerals.  #4: COPD/pneumonia: PCR positive for influenza A.  Increased oxygen  requirement.  Primary team has also started prednisone  and Rocephin  with doxycycline .     LOS: 2 [facility] 1/19/20263:40 PM  "

## 2024-12-20 NOTE — Plan of Care (Signed)

## 2024-12-20 NOTE — Progress Notes (Signed)
 " PROGRESS NOTE    [name]  FMW:[id number] DOB: 19-Dec-1945 DOA: 12/17/2024 PCP: Jacqu[facility] Candyce Stall, MD    Brief Narrative:  79 y.o. female with medical history significant for A-fib on Eliquis , ESRD on HD (TTS), T2DM, HFpEF (LVEF 60-65%), COPD on chronic 1 L O2, anemia of CKD, HTN, CAD s/p DES to RCA (12/2016), GERD, HLD, morbid obesity who presented [facility] the ED from [name]/2023 due to abdominal pain, constipation, shortness of breath, and cough, with concern for sigmoid volvulus on imaging.  Collateral obtained from staff at [name], reported patient was complaining of significant abdominal pain with abdominal x-ray concerning for sigmoid volvulus.  They noted patient has been constipated with last documented bowel movement on 1/14 noted to be small and hard.  Further history obtained from the patient and her son, [name], over the phone, indicating the patient had been also complaining of 3 days of cough productive of clear sputum and shortness of breath, generally feeling unwell and missing her HD session on Thursday, which was deviation from her normal strict compliance with her HD sessions.    In the ED, she was afebrile with a temp of 98.5 F, HR 79, RR 20, BP 110/55, SpO2 96-100% on 2 L [place].  CBC showed no leukocytosis, Hgb 10.3, thrombocytopenia to 108.  CMP without acute abnormalities.  4 Plex PCR positive for influenza A.  proBNP elevated to >35,000.  High-sensitivity troponin 165 > 161. EKG showed A-fib with incomplete RBBB and ventricular rate of 86 without acute ST segment changes. CXR showed stable cardiomegaly with possible left retrocardiac opacity concerning for atelectasis versus infiltrate and possible effusion. Abdominal x-ray showed no abnormal bowel dilation and mild stool burden CT abdomen pelvis without contrast showed moderate volume stool within the rectum possibly presenting fecal impaction with small volume within the upstream colon and no abnormal  bowel dilation.  Noted partially imaged mildly patulous lower esophagus containing a few nodular densities measuring up to 8 mm.  Increased height loss of L5 compression fracture.  Trace bilateral pleural effusions.   She was admitted for shortness of breath in the setting of recently missed dialysis with influenza A and concern for superimposed pneumonia as well as constipation.  Assessment and Plan:  Influenza A infection with possible superimposed pneumonia Acute exacerbation of COPD - Presenting with 2-3 days of cough productive of clear sputum and increased SOB. End expiratory wheezing noted on exam bilaterally. - No leukocytosis, afebrile.  - CXR showed possible left retrocardiac opacity concerning for atelectasis versus infiltrate  - Procalcitonin elevated to 1.01 - IV Rocephin  and PO doxycycline  for 5 days - Tamiflu  30 mg every other day for 3 days - Received IV Solumedrol in the ED. Continue prednisone  40 mg for 5 days  - PRN DuoNebs - Continue home Anoro Ellipta  - Flutter valve  - Repeat procalcitonin tomorrow  Acute metabolic encephalopathy - resolved - Patient acutely confused today with waxing and waning alertness, also with some delirious thoughts. When awakened, drowsy but oriented x 4. However, not at baseline at presentation, corroborated with son. - Possibly hospital delirium?  - Received oxycodone  5 mg around 6 AM but is on Norco 5-325 mg at home - Ammonia wnl - Lactic acid 1.5 - CT head noncon without acute abnormalities - Delirium precautions - Currently AAOx4  Constipation  Fecal impaction Diffuse abdominal pain  Nausea/Vomiting - Presented with concern for sigmoid volvulus on outside KUB. Patient had been complaining of diffuse abdominal  pain, with nausea and inability to keep down PO intake, sensation of fullness, and no bowel movements for several days. She is passing gas. Per [name] staff, last recorded BM on 1/14 noted to be small and hard -  Suspect abdominal pain and N/V secondary to fecal impaction/constipation - Suspect constipation secondary to opioids and PO Auryxia   - CT abdomen pelvis showed no evidence of volvulus, noted moderate volume stool within rectum and small volume stool within upstream colon - Per [name] staff, she is on Lactulose  20 g BID, senna x2 nightly, and PRN sorbitol  - Received mineral oil enema in ED - Lactulose  20 g TID, Senna BID, PRN Dulcolax suppository - Received SMOG enema 1/18 - Underwent manual disimpaction by RN today   Chronic hypoxic respiratory failure - Uses 1L [place] at home (order appears to be for 2 L/min) - Placed on 2L [place] with SpO2 96-100% and no documented desaturation, do not suspect acute hypoxia at this time - Now on 1L O2 with SpO2 100% - Goal SpO2 88-92%   Volume overload Bilateral pleural effusions, trace - ProBNP elevated to > 35,000 - CT abd/pelvis showed trace bilateral pleural effusions - Management with HD   ESRD on HD - TTS schedule, prior to presentation last HD on Tuesday , missed Thursday session - Nephrology consulted for HD - Last HD session 1/17   Type II MI - hsTroponins elevated to 165 > 161 - Denies chest pain - EKG without acute ST-segment changes - Likely secondary to demand ischemia given influenza and possible superimposed pneumonia in the setting of ESRD    L5 compression fracture - Previously noted on CT abd/pelvis in 11/2024 - CT abd/pelvis (1/16) showed increased height loss of L5 compression fracture - Likely contributing referred pain to abdominal area - PRN oxycodone  - Lidocaine  patch - Would benefit from outpatient referral to spine and consideration of kyphoplasty if indicated   Patulous esophagus Esophageal dysphagia GERD - CT abdomen/pelvis showed mildly patulous lower esophagus containing a few nodular densities measuring up to 8 mm with recommendation for correlation with endoscopy - GI consulted, Dr. Therisa evaluated patient,  indicated esophageal CT findings possibly food particles vs neoplasm, recommended barium swallow to rule out achalasia prior to discharge, then outpatient follow up if inconclusive for consideration of EGD - SLP evaluated patient - Increased Protonix  to 40 mg BID   Thrombocytopenia - Unclear etiology - Monitor   T2DM - Hgb A1c (06/2024) 6.0 - Repeat A1c ordered - On SSI only at facility, reportedly refuses correctional insulin  often if BG < 200 - Start SSI 0 - 20 AC - ACHS Accu Checks   Permanent AFib - Currently rate-controlled - Not on rate control agents - Continue Eliquis  2.5 mg twice daily   Chronic hypotension - Continue home Florinef  0.2 mg twice daily, midodrine  10 mg 3 times daily, droxidopa  200 mg 3 times daily  Gout - Continue home allopurinol  100 mg daily   HLD - Continue home Crestor  10 mg daily   CAD s/p DES to RCA - Continue Eliquis  and Crestor    Morbid Obesity (BMI Body mass index is 40.89 kg/m. kg/m) - Obesity is clinically significant and complicates care - Recommend continued discussion with PCP re: lifestyle modification including dietary changes, regular physical activity, and weight reduction strategies.   Scheduled Meds:  allopurinol   100 mg Oral Daily   apixaban   2.5 mg Oral BID   doxycycline   100 mg Oral Q12H   droxidopa   200 mg  Oral TID with meals   ezetimibe   10 mg Oral Daily   [START ON 12/21/2024] feeding supplement (NEPRO CARB STEADY)  237 mL Oral BID BM   fludrocortisone   0.2 mg Oral BID   insulin  aspart  0-20 Units Subcutaneous TID WC   lactulose   20 g Oral TID   lidocaine   1 patch Transdermal Q24H   lidocaine   1 Application Topical BID   midodrine   10 mg Oral TID PC   multivitamin  1 tablet Oral QHS   pantoprazole   40 mg Oral Daily   predniSONE   40 mg Oral Q breakfast   rosuvastatin   10 mg Oral Daily   senna  1 tablet Oral BID   umeclidinium-vilanterol  1 puff Inhalation Daily   Continuous Infusions:  cefTRIAXone  (ROCEPHIN )   IV 1 g (12/20/24 1528)   PRN Meds:.acetaminophen  **OR** [DISCONTINUED] acetaminophen , bisacodyl , guaiFENesin -dextromethorphan , ipratropium-albuterol , naloxone , ondansetron  **OR** ondansetron  (ZOFRAN ) IV, oxyCODONE   Current Outpatient Medications  Medication Instructions   acetaminophen  (TYLENOL ) 650 mg, Oral, Every 8 hours, (0600, 1400 & 2200)   albuterol  (PROVENTIL ) 2.5 mg, Nebulization, Every 6 hours PRN   allopurinol  (ZYLOPRIM ) 100 mg, Oral, Daily   aluminum -magnesium  hydroxide-simethicone  (MAALOX) 200-200-20 MG/5ML SUSP 30 mLs, Oral, Every 4 hours PRN   apixaban  (ELIQUIS ) 2.5 mg, Oral, 2 times daily   benzonatate  (TESSALON ) 100 mg, Oral, 3 times daily PRN   bisacodyl  (DULCOLAX) 10 mg, Rectal, Daily PRN   cinacalcet  (SENSIPAR ) 30 mg, Oral, Daily with supper, (1800)   clotrimazole-betamethasone (LOTRISONE) cream 1 Application, Topical, See admin instructions, Apply as needed to affected area of ear for itching/irritation.    dextromethorphan -guaiFENesin  (MUCINEX  DM) 30-600 MG 12hr tablet 1 tablet, 2 times daily   Droxidopa  200 mg, Oral, 3 times daily with meals   ezetimibe  (ZETIA ) 10 mg, Oral, Daily   ferric citrate  (AURYXIA ) 420 mg, Oral, 3 times daily with meals   fexofenadine (ALLEGRA) 180 mg, Oral, Every morning, (0800)   fludrocortisone  (FLORINEF ) 0.2 mg, Oral, 2 times daily   fluticasone (FLONASE) 50 MCG/ACT nasal spray 1 spray, Each Nare, Every morning, (0900)   guaiFENesin -dextromethorphan  (ROBITUSSIN DM) 100-10 MG/5ML syrup 15 mLs, Oral, Every 4 hours PRN   HYDROcodone -acetaminophen  (NORCO/VICODIN) 5-325 MG tablet 1-2 tablets, Oral, Every 6 hours PRN   insulin  aspart (NOVOLOG ) 0-20 Units, Subcutaneous, 3 times daily with meals, For CBG 70-120: 0 units For CBG 121-150: 3 units For CBG 151-200: 4 units For CBG 201-250: 7 units For CBG 251-300: 11 units For CBG 301-350: 15 units For CBG 351-400: 20 units For CBG > 400: 25 units and call your doctor   ipratropium-albuterol   (DUONEB) 0.5-2.5 (3) MG/3ML SOLN 3 mLs, Nebulization, 2 times daily   lactulose  (CHRONULAC ) 20 g, Oral, 2 times daily   lidocaine  (LIDODERM ) 5 % 1 patch, Transdermal, Daily   Lidocaine  HCl (ASPERCREME LIDOCAINE ) 4 % CREA 1 Application, Topical, 2 times daily, Applied to bilateral knees   magnesium  hydroxide (MILK OF MAGNESIA) 400 MG/5ML suspension 30 mLs, Oral, Daily PRN   Menthol -Zinc  Oxide (MOISTURE BARRIER EX) 1 Application, Topical, As directed, Apply barrier cream to buttocks every shift   midodrine  (PROAMATINE ) 10 mg, Oral, 3 times daily   Multiple Vitamin (MULTIVITAMIN WITH MINERALS) TABS tablet 1 tablet, Oral, Daily at bedtime   naloxone  (NARCAN ) nasal spray 4 mg/0.1 mL 1 spray, Nasal, 3 times daily PRN   nitroGLYCERIN  (NITROSTAT ) 0.4 mg, Sublingual, Every 5 min PRN   ondansetron  (ZOFRAN -ODT) 4 mg, Oral, Every 8 hours PRN   oseltamivir  (TAMIFLU )  30 mg, 2 times daily   OXYGEN  2 L/min, Inhalation, As needed   pantoprazole  (PROTONIX ) 40 mg, Oral, Daily   polyethylene glycol (MIRALAX  / GLYCOLAX ) 17 g, Oral, Daily   promethazine -dextromethorphan  (PROMETHAZINE -DM) 6.25-15 MG/5ML syrup 5 mLs, Oral, 3 times daily   rosuvastatin  (CRESTOR ) 10 mg, Oral, Daily   senna-docusate (SENOKOT-S) 8.6-50 MG tablet 2 tablets, Oral, Daily at bedtime   simethicone  (MYLICON) 80 mg, Oral, Daily PRN   sodium phosphate (FLEET) ENEM 1 enema, Rectal, Daily PRN   sorbitol  70 % solution 30 mLs, Oral, Daily PRN   umeclidinium-vilanterol (ANORO ELLIPTA ) 62.5-25 MCG/ACT AEPB 1 puff, Daily    DVT prophylaxis: apixaban  (ELIQUIS ) tablet 2.5 mg Start: 12/19/24 1609 apixaban  (ELIQUIS ) tablet 2.5 mg   Code Status:   Code Status: Full Code  Family Communication: Son [name] at bedside  Disposition Plan: Back to [name] when clinically improved PT -   -   OT -   -    Level of care: Telemetry  Consultants:  Nephrology, GI  Antimicrobials: IV Rocephin , PO Doxy   Subjective: Seen at bedside.  Patient's  son [name] at bedside. Doing well today. Confusion and altered mentation resolved. Patient is very frustrated with the care she is receiving at her facility, states inconsistency with nursing staff and level of care she is receiving.    Objective: Vitals:   12/19/24 1717 12/19/24 2155 12/20/24 0446 12/20/24 0735  BP:  114/71 (!) 113/58 (!) 88/69  Pulse:  60 62 69  Resp:  16 18 18   Temp: 97.6 F (36.4 C) 97.6 F (36.4 C) 97.7 F (36.5 C) 98 F (36.7 C)  TempSrc: Oral   Oral  SpO2:  100% 100% 97%  Weight:      Height:       No intake or output data in the 24 hours ending 12/20/24 1844  Filed Weights   12/18/24 0041 12/18/24 1305 12/18/24 1615  Weight: 99 kg 99 kg 97 kg    Examination:  Gen: elderly female, NAD HEENT: NCAT Neck: Supple CV: RRR, no murmurs Resp: normal WOB, CTAB Abd: Soft, generalized tenderness to palpation, nondistended, no guarding, BS normoactive Ext: 1+ LE edema, pulses 2+ b/l, LUE AVF Skin: Warm, dry Neuro:  Awake, alert, oriented x 4, no focal motor or sensory deficits Psych: Calm, cooperative   Data Reviewed: I have personally reviewed following labs and imaging studies  CBC: Recent Labs  Lab 12/17/24 1140 12/17/24 2105 12/18/24 0111 12/19/24 0852 12/20/24 0934  WBC 6.1 6.6 7.4 8.7 8.6  NEUTROABS 5.6  --   --   --   --   HGB 10.3* 10.1* 10.8* 10.9* 10.8*  HCT 33.2* 33.2* 34.9* 35.2* 34.3*  MCV 103.4* 102.8* 101.5* 100.9* 100.0  PLT 108* 131* 122* 113* 123*   Basic Metabolic Panel: Recent Labs  Lab 12/17/24 1140 12/17/24 2105 12/18/24 0111 12/19/24 0852 12/20/24 0934  NA 136  --  137 136 137  K 4.4  --  4.4 4.0 4.3  CL 92*  --  91* 93* 93*  CO2 29  --  24 27 28   GLUCOSE 181*  --  207* 150* 106*  BUN 33*  --  41* 35* 46*  CREATININE 6.23* 6.50* 6.69* 5.31* 6.34*  CALCIUM  7.7*  --  7.9* 7.9* 7.7*  PHOS  --   --   --  2.4* 2.8   GFR: Estimated Creatinine Clearance: 7.9 mL/min (A) (by C-G formula based on SCr of 6.34 mg/dL  (H)).  Liver Function Tests: Recent Labs  Lab 12/17/24 1140 12/19/24 0852 12/20/24 0934  AST 33  --   --   ALT 13  --   --   ALKPHOS 181*  --   --   BILITOT 0.3  --   --   PROT 6.6  --   --   ALBUMIN  3.5 3.7 3.4*   No results for input(s): LIPASE, AMYLASE in the last 168 hours. Recent Labs  Lab 12/19/24 1538  AMMONIA 29   Coagulation Profile: No results for input(s): INR, PROTIME in the last 168 hours. Cardiac Enzymes: No results for input(s): CKTOTAL, CKMB, CKMBINDEX, TROPONINI in the last 168 hours. BNP (last 3 results) Recent Labs    12/17/24 1148  PROBNP >35,000.0*   HbA1C: No results for input(s): HGBA1C in the last 72 hours. CBG: Recent Labs  Lab 12/19/24 1652 12/19/24 2156 12/20/24 0737 12/20/24 1123 12/20/24 1631  GLUCAP 85 120* 100* 105* 138*   Lipid Profile: No results for input(s): CHOL, HDL, LDLCALC, TRIG, CHOLHDL, LDLDIRECT in the last 72 hours. Thyroid  Function Tests: No results for input(s): TSH, T4TOTAL, FREET4, T3FREE, THYROIDAB in the last 72 hours. Anemia Panel: No results for input(s): VITAMINB12, FOLATE, FERRITIN, TIBC, IRON , RETICCTPCT in the last 72 hours. Sepsis Labs: Recent Labs  Lab 12/17/24 2105 12/19/24 1538 12/19/24 1951  PROCALCITON 1.01  --   --   LATICACIDVEN  --  1.5 1.2    Recent Results (from the past 240 hours)  Resp panel by RT-PCR (RSV, Flu A&B, Covid) Anterior Nasal Swab     Status: Abnormal   Collection Time: 12/17/24 11:40 AM   Specimen: Anterior Nasal Swab  Result Value Ref Range Status   SARS Coronavirus 2 by RT PCR NEGATIVE NEGATIVE Final    Comment: (NOTE) SARS-CoV-2 target nucleic acids are NOT DETECTED.  The SARS-CoV-2 RNA is generally detectable in upper respiratory specimens during the acute phase of infection. The lowest concentration of SARS-CoV-2 viral copies this assay can detect is 138 copies/mL. A negative result does not preclude  SARS-Cov-2 infection and should not be used as the sole basis for treatment or other patient management decisions. A negative result may occur with  improper specimen collection/handling, submission of specimen other than nasopharyngeal swab, presence of viral mutation(s) within the areas targeted by this assay, and inadequate number of viral copies(<138 copies/mL). A negative result must be combined with clinical observations, patient history, and epidemiological information. The expected result is Negative.  Fact Sheet for Patients:  bloggercourse.com  Fact Sheet for Healthcare Providers:  seriousbroker.it  This test is no t yet approved or cleared by the [place]  FDA and  has been authorized for detection and/or diagnosis of SARS-CoV-2 by FDA under an Emergency Use Authorization (EUA). This EUA will remain  in effect (meaning this test can be used) for the duration of the COVID-19 declaration under Section 564(b)(1) of the Act, 21 U.S.C.section 360bbb-3(b)(1), unless the authorization is terminated  or revoked sooner.       Influenza A by PCR POSITIVE (A) NEGATIVE Final   Influenza B by PCR NEGATIVE NEGATIVE Final    Comment: (NOTE) The Xpert Xpress SARS-CoV-2/FLU/RSV plus assay is intended as an aid in the diagnosis of influenza from Nasopharyngeal swab specimens and should not be used as a sole basis for treatment. Nasal washings and aspirates are unacceptable for Xpert Xpress SARS-CoV-2/FLU/RSV testing.  Fact Sheet for Patients: bloggercourse.com  Fact Sheet for Healthcare Providers: seriousbroker.it  This test is not  yet approved or cleared by the [place]  FDA and has been authorized for detection and/or diagnosis of SARS-CoV-2 by FDA under an Emergency Use Authorization (EUA). This EUA will remain in effect (meaning this test can be used) for the duration of  the COVID-19 declaration under Section 564(b)(1) of the Act, 21 U.S.C. section 360bbb-3(b)(1), unless the authorization is terminated or revoked.     Resp Syncytial Virus by PCR NEGATIVE NEGATIVE Final    Comment: (NOTE) Fact Sheet for Patients: bloggercourse.com  Fact Sheet for Healthcare Providers: seriousbroker.it  This test is not yet approved or cleared by the [place]  FDA and has been authorized for detection and/or diagnosis of SARS-CoV-2 by FDA under an Emergency Use Authorization (EUA). This EUA will remain in effect (meaning this test can be used) for the duration of the COVID-19 declaration under Section 564(b)(1) of the Act, 21 U.S.C. section 360bbb-3(b)(1), unless the authorization is terminated or revoked.  Performed at [facility], [address] [place], [place] [address]   MRSA Next Gen by PCR, Nasal     Status: None   Collection Time: 12/18/24 12:49 AM   Specimen: Nasal Mucosa; Nasal Swab  Result Value Ref Range Status   MRSA by PCR Next Gen NOT DETECTED NOT DETECTED Final    Comment: (NOTE) The GeneXpert MRSA Assay (FDA approved for NASAL specimens only), is one component of a comprehensive MRSA colonization surveillance program. It is not intended to diagnose MRSA infection nor to guide or monitor treatment for MRSA infections. Test performance is not FDA approved in patients less than 42 years old. Performed at [facility], [address] [place], [place] [address]      Radiology Studies: [facility] [place] 1 View Result Date: 12/20/2024 CLINICAL DATA:  High risk for aspiration. EXAM: PORTABLE CHEST 1 VIEW COMPARISON:  12/17/2024 and CT chest 06/21/2024. FINDINGS: Trachea is midline. Heart is enlarged. Left perihilar consolidation with streaky opacification in the lower lobes. Small loculated left pleural effusion. IMPRESSION: 1. Left perihilar consolidation may be due to  pneumonia. Difficult to exclude a mass. Consider CT chest with contrast in further evaluation, as clinically indicated. 2. Streaky bibasilar atelectasis. 3. Small loculated left pleural effusion. Electronically Signed   By: Newell Eke M.D.   On: 12/20/2024 10:31   CT HEAD WO CONTRAST ( ) Result Date: 12/19/2024 EXAM: CT HEAD WITHOUT CONTRAST 12/19/2024 03:12:00 PM TECHNIQUE: CT of the head was performed without the administration of intravenous contrast. Automated exposure control, iterative reconstruction, and/or weight based adjustment of the mA/kV was utilized to reduce the radiation dose to as low as reasonably achievable. COMPARISON: 06/21/2024 CLINICAL HISTORY: Altered mental status, nontraumatic FINDINGS: BRAIN AND VENTRICLES: Mild generalized atrophy and white matter changes are stable, potentially within normal limits for age. Atherosclerotic calcifications within the cavernous internal carotid and vertebral arteries. No acute hemorrhage. No evidence of acute infarct. No hydrocephalus. No extra-axial collection. No mass effect or midline shift. ORBITS: Left lens replacement. SINUSES: Bilateral mastoid effusions. Chronic wall thickening is present in the right maxillary sinus. No active disease is present. SOFT TISSUES AND SKULL: No acute soft tissue abnormality. No skull fracture. IMPRESSION: 1. No acute intracranial abnormality or significant interval change . Electronically signed by: Lonni Necessary MD 12/19/2024 06:35 PM EST RP Workstation: [id number]    Scheduled Meds:  allopurinol   100 mg Oral Daily   apixaban   2.5 mg Oral BID   doxycycline   100 mg Oral Q12H   droxidopa   200 mg Oral  TID with meals   ezetimibe   10 mg Oral Daily   [START ON 12/21/2024] feeding supplement (NEPRO CARB STEADY)  237 mL Oral BID BM   fludrocortisone   0.2 mg Oral BID   insulin  aspart  0-20 Units Subcutaneous TID WC   lactulose   20 g Oral TID   lidocaine   1 patch Transdermal Q24H   lidocaine   1  Application Topical BID   midodrine   10 mg Oral TID PC   multivitamin  1 tablet Oral QHS   pantoprazole   40 mg Oral Daily   predniSONE   40 mg Oral Q breakfast   rosuvastatin   10 mg Oral Daily   senna  1 tablet Oral BID   umeclidinium-vilanterol  1 puff Inhalation Daily   Continuous Infusions:  cefTRIAXone  (ROCEPHIN )  IV 1 g (12/20/24 1528)     Unresulted Labs (From admission, onward)     Start     Ordered   12/21/24 0500  CBC  Tomorrow morning,   R        12/20/24 0745   12/21/24 0500  Renal function panel  Tomorrow morning,   R        12/20/24 0745   12/17/24 1748  Strep pneumoniae urinary antigen  (COPD / Pneumonia / Cellulitis / Lower Extremity Wound (Diabetic Foot Infection))  Once,   R        12/17/24 1756   12/17/24 1748  Legionella Pneumophila Serogp 1 Ur Ag  (COPD / Pneumonia / Cellulitis / Lower Extremity Wound (Diabetic Foot Infection))  Once,   R        12/17/24 1756   Signed and Held  Renal function panel  Once,   R        Signed and Held   Signed and Held  CBC  Once,   R        Signed and Held             LOS:  LOS: 2 days   Time Spent: 45 minutes  Betsie Peckman Al-Sultani, MD [facility]  If 7PM-7AM, please contact night-coverage  12/20/2024, 6:44 PM      "

## 2024-12-20 NOTE — Evaluation (Signed)
 Clinical/Bedside Swallow Evaluation Patient Details  Name: Jacqueline [name] MRN: [id number] Date of Birth: 1946-09-09  Today's Date: 12/20/2024 Time: SLP Start Time (ACUTE ONLY): 1400 SLP Stop Time (ACUTE ONLY): 1500 SLP Time Calculation (min) (ACUTE ONLY): 60 min  Past Medical History:  Past Medical History:  Diagnosis Date   Arthritis    left knee (12/04/2016)   CAD (coronary artery disease)    a. 12/2016 NSTEMI/PCI: LM nl, LaD 20p/d, D1 20, OM2 20, OM3 20, RCA 31m (3.0x18 Resolute DES). EF 65%.   Chronic lower back pain    Diverticulosis    on CT   ESRD on dialysis [facility])    GERD (gastroesophageal reflux disease)    Hiatal hernia    History of echocardiogram    a. 09/2022 Echo: EF 60-65%, mild asymm LVH, mod reduced RV fxn, RVSP . Mildly dil RA. Mild AI. Ao sclerosis. Mild MR.   Hyperlipidemia    Hypertension    a. 12/2016 labetalol  d/c'd 2/2 hypotension in HD.   Morbid obesity (HCC)    Obesity    Persistent atrial fibrillation (HCC)    a. 05/2023 Zio: 100% afib avg HR 52 (32-99).   Pneumonia    couple times (12/04/2016)   Symptomatic bradycardia 06/07/2023   Type II diabetes mellitus [facility])    Past Surgical History:  Past Surgical History:  Procedure Laterality Date   A/V FISTULAGRAM N/A 11/12/2023   Procedure: A/V Fistulagram;  Surgeon: Melia Lynwood ORN, MD;  [facility]: [facility] INVASIVE CV LAB;  Service: Cardiovascular;  Laterality: N/A;   A/V FISTULAGRAM Left 09/29/2024   Procedure: A/V Fistulagram;  Surgeon: Pearline Norman RAMAN, MD;  Location: [facility] PV LAB;  Service: Cardiovascular;  Laterality: Left;   ABDOMINAL HYSTERECTOMY     AV FISTULA PLACEMENT Left 01/27/2014   Procedure: ARTERIOVENOUS (AV) FISTULA CREATION;  Surgeon: Lynwood JONETTA Collum, MD;  Location: [facility] OR;  Service: Vascular;  Laterality: Left;   AV FISTULA REPAIR     had it cleaned out   CARDIAC CATHETERIZATION  ?1980s   CARDIAC CATHETERIZATION N/A 12/04/2016   Procedure: Left Heart Cath Angiography;  Surgeon:  Lonni JONETTA Cash, MD;  [facility] INVASIVE CV LAB;  Service: Cardiovascular;  Laterality: N/A;   CARDIAC CATHETERIZATION N/A 12/04/2016   Procedure: Coronary Stent Intervention;  Surgeon: Lonni JONETTA Cash, MD;  Location: [facility] INVASIVE CV LAB;  Service: Cardiovascular;  Laterality: N/A;  Mid RCA   CATARACT EXTRACTION W/ INTRAOCULAR LENS IMPLANT Left    COLONOSCOPY W/ BIOPSIES AND POLYPECTOMY     CORONARY ANGIOPLASTY WITH STENT PLACEMENT  12/04/2016   ESOPHAGOGASTRODUODENOSCOPY     INSERTION OF DIALYSIS CATHETER N/A 01/19/2014   Procedure: INSERTION OF DIALYSIS CATHETER;  Surgeon: Krystal JULIANNA Doing, MD;  Location: [facility] OR;  Service: Vascular;  Laterality: N/A;   PERIPHERAL VASCULAR BALLOON ANGIOPLASTY  11/12/2023   Procedure: PERIPHERAL VASCULAR BALLOON ANGIOPLASTY;  Surgeon: Melia Lynwood ORN, MD;  Location: [facility] INVASIVE CV LAB;  Service: Cardiovascular;;   RIGHT/LEFT HEART CATH AND CORONARY ANGIOGRAPHY N/A 06/11/2023   Procedure: RIGHT/LEFT HEART CATH AND CORONARY ANGIOGRAPHY;  Surgeon: Mady Lonni, MD;  Location: [facility] INVASIVE CV LAB;  Service: Cardiovascular;  Laterality: N/A;   VENOUS ANGIOPLASTY  09/29/2024   Procedure: VENOUS ANGIOPLASTY;  Surgeon: Pearline Norman RAMAN, MD;  Location: [facility] PV LAB;  Service: Cardiovascular;;  cephalic arch   HPI:  Pt is a 79 y.o. female with medical history significant for A-fib on Eliquis , ESRD on HD (TTS), T2DM, HFpEF (LVEF 60-65%), COPD on chronic  1 L O2, anemia of CKD, HTN, CAD s/p DES to RCA (12/2016), GERD, Hiatal Hernia per chart, HLD, morbid Obesity who presented to the ED from [facility] on 12/18/2023 due to abdominal pain, constipation, shortness of breath, and cough, with concern for sigmoid volvulus on imaging.  Collateral obtained from staff at [facility], reported patient was complaining of significant abdominal pain with abdominal x-ray concerning for sigmoid volvulus.  They noted patient has been constipated with last documented bowel  movement on 1/14 noted to be small and hard.  Further history obtained from the patient and her son, Jacqueline [name], over the phone, indicating the patient had been also complaining of 3 days of cough productive of clear sputum and shortness of breath, generally feeling unwell and missing her HD session on Thursday, which was deviation from her normal strict compliance with her HD sessions.   Pt reports significant difficulty w/ GERD s/s PTA.   CTAb this admit: Lower chest: No focal consolidation or pulmonary nodule in the lung  bases. Trace bilateral pleural effusions.  Partially imaged mildly patulous lower esophagus containing a few nodular densities measuring up to 8 mm.     Assessment / Plan / Recommendation  Clinical Impression    Pt seen for BSE today. Pt awake, verbal and talkative describing her issues of Globus and Esphageal phase Dsymotility to this SLP. Son present. Pt endorsed taking TUMS/Rolaids when I need it at her NH. Pt also eats many Mints as candy.  On RA; had been on Summers 2L; afebrile, WBC not elevated.    OF NOTE: Pt does strongly endorse s/s of REFLUX at Baseline. She reports episodes of Phlegm and burning feeling in my chest pointing to her mid-sternum area. She endorses belching and Globus. Noted CT of Abd: patulous lower esophagus containing a few nodular densities measuring up to 8 mm.   Pt appears to present w/ functional oropharyngeal phase swallowing in setting of Missing Dentition for effective mastication; also GERD s/s. No overt oropharyngeal phase dysphagia appreciated during oral intake of trials; No neuromuscular swallowing deficits appreciated.  Pt appears at reduced risk for aspiration from an oropharyngeal phase standpoint when following general aspiration precautions. HOWEVER, pt has a baseline presentation of REFLUX/GERD and episodes of REFLUX behavior w/ oral intake, she is not on a PPI currently at her NH(takes TUMS/Rolaids). Noted the CT imaging results. ANY  Esophageal phase Dysmotility or Regurgitation of Reflux material can increase risk for aspiration of the Reflux material during Retrograde flow thus impact Voicing and Pulmonary status. Pt described issues of globus, dry throat clearing, burning in my chest, phlegm, and hacking cough frequently during day/night.    Pt sat upright in bed and consumed trials of thin liquids Via Cup, purees, and softened solid foods w/ No overt clinical s/s of aspiration noted; clear vocal quality b/t trials, no decline in pulmonary status, no cough, no phlegm. No decline in O2 sats(97%). Oral phase appeared [facility] for bolus management and timely A-P transfer/clearing of material. Mastication Time was min increased in setting of Missing Dentition (molars) for effective mastication of solids. Oral clearing followed for all trials. OM exam was [facility] for oral clearing; lingual/labial movements. No unilateral weakness. Speech clear.    Recommend a more Mech Soft/Regular diet (chopped meats/foods as needed for mastication ease; moistened well foods) w/ thin liquids. General aspiration precautions including small bites/sips slowly and sitting upright for all oral intake. Rest Breaks during meals/oral intake to allow for Esophageal clearing. REFLUX precautions recommended  to lessen chance for Regurgitation of REFLUX material -- HOB elevated post meals and at night when sleeping. Pills Crushed vs Whole in puree. Noted MD has initiated a PPI w/ consideration of BID coverage currently - this was discussed w/ pt/Son also.   Recommend pt continue f/u w/ GI for assessment/management of REFLUX/GERD and tx as indicated. Discussion and handouts given on REFLUX, behaviors to manage REFLUX, and foods/diet w/ REFLUX. MD to reconsult ST services if any new needs while admitted; MD agreed. NSG updated. Pt/Son appreciative of Education information. Precautions posted in chart, room.  SLP Visit Diagnosis: Dysphagia, unspecified (R13.10) (suspect impact  from Baseline GERD and Esophageal phase Dysmotility; deconditioning and more sedentary in bed; need for setup support at meals; Missing Dentition)    Aspiration Risk   (reduced from an oropharyngeal phase standpoint; min increased from a REFLUX point)    Diet Recommendation   Thin;Dysphagia 3 (mechanical soft);Age appropriate regular (more chopped/cut meats moistened; moistened foods) = a more Mech Soft/Regular diet (chopped meats/foods as needed for mastication ease; moistened well foods) w/ thin liquids. General aspiration precautions including small bites/sips slowly and sitting upright for all oral intake. Rest Breaks during meals/oral intake to allow for Esophageal clearing. REFLUX precautions recommended to lessen chance for Regurgitation of REFLUX material -- HOB elevated post meals and at night when sleeping.   Medication Administration: Crushed with puree (vs Whole in Puree as able)    Other Recommendations Recommended Consults: Consider GI evaluation;Consider esophageal assessment (Dietician support d/t questions about her diet) Oral Care Recommendations: Oral care BID;Patient independent with oral care (support)     Assistance Recommended at Discharge  See above- PRN/intermittent  Functional Status Assessment Patient has had a recent decline in their functional status and demonstrates the ability to make significant improvements in function in a reasonable and predictable amount of time.  Frequency and Duration  (n/a)   (n/a)       Prognosis Prognosis for improved oropharyngeal function: Fair (-Good) Barriers to Reach Goals: Time post onset;Severity of deficits Barriers/Prognosis Comment: suspect impact from Baseline GERD and Esophageal phase Dysmotility; deconditioning and more sedentary in bed; need for setup support at meals; Missing Dentition      Swallow Study   General Date of Onset: 12/17/24 HPI: Pt is a 79 y.o. female with medical history significant for A-fib on  Eliquis , ESRD on HD (TTS), T2DM, HFpEF (LVEF 60-65%), COPD on chronic 1 L O2, anemia of CKD, HTN, CAD s/p DES to RCA (12/2016), GERD, Hiatal Hernia per chart, HLD, morbid Obesity who presented to the ED from [facility] on 12/18/2023 due to abdominal pain, constipation, shortness of breath, and cough, with concern for sigmoid volvulus on imaging.  Collateral obtained from staff at [facility], reported patient was complaining of significant abdominal pain with abdominal x-ray concerning for sigmoid volvulus.  They noted patient has been constipated with last documented bowel movement on 1/14 noted to be small and hard.  Further history obtained from the patient and her son, Jacqueline [name], over the phone, indicating the patient had been also complaining of 3 days of cough productive of clear sputum and shortness of breath, generally feeling unwell and missing her HD session on Thursday, which was deviation from her normal strict compliance with her HD sessions.  Pt reports significant difficulty w/ GERD s/s PTA.    CTAb this admit: Lower chest: No focal consolidation or pulmonary nodule in the lung  bases. Trace bilateral pleural effusions.  Partially imaged mildly  patulous lower esophagus  containing a few nodular densities measuring up to 8 mm. Type of Study: Bedside Swallow Evaluation Previous Swallow Assessment: none Diet Prior to this Study: Regular;Thin liquids (Level 0) (ordered this admit) Temperature Spikes Noted: No (wbc 8.6) Respiratory Status: Room air History of Recent Intubation: No Behavior/Cognition: Alert;Cooperative;Pleasant mood (Son present) Oral Cavity Assessment: Within Functional Limits Oral Care Completed by SLP: Recent completion by staff Oral Cavity - Dentition: Missing dentition (MOST- few lower anterior dentition) Vision: Functional for self-feeding Self-Feeding Abilities: Able to feed self;Needs assist;Needs set up Patient Positioning: Upright in bed (supported fully in  bed) Baseline Vocal Quality: Normal Volitional Cough: Strong Volitional Swallow: Able to elicit    Oral/Motor/Sensory Function Overall Oral Motor/Sensory Function: Within functional limits   Ice Chips Ice chips: Not tested   Thin Liquid Thin Liquid: Within functional limits Presentation: Self Fed;Cup (9-10 trials) Other Comments: cautioned on straw use d/t air swallowed    Nectar Thick Nectar Thick Liquid: Not tested   Honey Thick Honey Thick Liquid: Not tested   Puree Puree: Within functional limits Presentation: Spoon (fed; 5 trials)   Solid     Solid: Within functional limits Presentation: Spoon (fed; 6 trials) Other Comments: had eaten some of her lunch meal prior to arrival         Comer Portugal, MS, MCKESSON Speech Language Pathologist Rehab Services; [facility] [phone] (ascom) Notnamed Croucher 12/20/2024,5:13 PM

## 2024-12-21 DIAGNOSIS — J189 Pneumonia, unspecified organism: Secondary | ICD-10-CM | POA: Diagnosis not present

## 2024-12-21 DIAGNOSIS — J101 Influenza due to other identified influenza virus with other respiratory manifestations: Secondary | ICD-10-CM | POA: Diagnosis not present

## 2024-12-21 DIAGNOSIS — K5903 Drug induced constipation: Secondary | ICD-10-CM | POA: Diagnosis not present

## 2024-12-21 DIAGNOSIS — J9611 Chronic respiratory failure with hypoxia: Secondary | ICD-10-CM | POA: Diagnosis not present

## 2024-12-21 LAB — CBC
HCT: 31.8 % — ABNORMAL LOW (ref 36.0–46.0)
Hemoglobin: 10.2 g/dL — ABNORMAL LOW (ref 12.0–15.0)
MCH: 31.9 pg (ref 26.0–34.0)
MCHC: 32.1 g/dL (ref 30.0–36.0)
MCV: 99.4 fL (ref 80.0–100.0)
Platelets: 122 K/uL — ABNORMAL LOW (ref 150–400)
RBC: 3.2 MIL/uL — ABNORMAL LOW (ref 3.87–5.11)
RDW: 18.4 % — ABNORMAL HIGH (ref 11.5–15.5)
WBC: 9.9 K/uL (ref 4.0–10.5)
nRBC: 0 % (ref 0.0–0.2)

## 2024-12-21 LAB — GLUCOSE, CAPILLARY
Glucose-Capillary: 193 mg/dL — ABNORMAL HIGH (ref 70–99)
Glucose-Capillary: 184 mg/dL — ABNORMAL HIGH (ref 70–99)
Glucose-Capillary: 115 mg/dL — ABNORMAL HIGH (ref 70–99)
Glucose-Capillary: 213 mg/dL — ABNORMAL HIGH (ref 70–99)

## 2024-12-21 LAB — RENAL FUNCTION PANEL
Albumin: 3.3 g/dL — ABNORMAL LOW (ref 3.5–5.0)
Anion gap: 20 — ABNORMAL HIGH (ref 5–15)
BUN: 60 mg/dL — ABNORMAL HIGH (ref 8–23)
CO2: 23 mmol/L (ref 22–32)
Calcium: 7.6 mg/dL — ABNORMAL LOW (ref 8.9–10.3)
Chloride: 95 mmol/L — ABNORMAL LOW (ref 98–111)
Creatinine, Ser: 7.41 mg/dL — ABNORMAL HIGH (ref 0.44–1.00)
GFR, Estimated: 5 mL/min — ABNORMAL LOW
Glucose, Bld: 193 mg/dL — ABNORMAL HIGH (ref 70–99)
Phosphorus: 2.5 mg/dL (ref 2.5–4.6)
Potassium: 3.8 mmol/L (ref 3.5–5.1)
Sodium: 138 mmol/L (ref 135–145)

## 2024-12-21 LAB — MAGNESIUM: Magnesium: 2.4 mg/dL (ref 1.7–2.4)

## 2024-12-21 LAB — PROCALCITONIN: Procalcitonin: 0.82 ng/mL

## 2024-12-21 MED ORDER — MIDODRINE HCL 5 MG PO TABS
ORAL_TABLET | ORAL | Status: AC
  Filled 2024-12-21: qty 2

## 2024-12-21 MED ORDER — PEG 3350-KCL-NA BICARB-NACL 420 G PO SOLR
2000.0000 mL | Freq: Once | ORAL | Status: DC
  Filled 2024-12-21: qty 4000

## 2024-12-21 MED ORDER — NEPRO/CARBSTEADY PO LIQD
237.0000 mL | Freq: Every day | ORAL | Status: DC
  Administered 2024-12-22 – 2024-12-23 (×2): 237 mL via ORAL

## 2024-12-21 MED ORDER — BISACODYL 10 MG RE SUPP
10.0000 mg | Freq: Once | RECTAL | Status: DC
  Filled 2024-12-21: qty 1

## 2024-12-21 MED ORDER — CALCIUM ACETATE (PHOS BINDER) 667 MG PO CAPS
667.0000 mg | ORAL_CAPSULE | Freq: Three times a day (TID) | ORAL | Status: DC
  Administered 2024-12-21 – 2024-12-23 (×5): 667 mg via ORAL
  Filled 2024-12-21 (×7): qty 1

## 2024-12-21 NOTE — Plan of Care (Signed)

## 2024-12-21 NOTE — Care Management Important Message (Signed)
 Important Message  Patient Details  Name: Jacqueline [name] MRN: [id number] Date of Birth: 20-Dec-1945   Important Message Given:  Yes - Medicare IM     Joee Iovine W, CMA 12/21/2024, 11:53 AM

## 2024-12-21 NOTE — TOC Progression Note (Signed)
 Transition of Care [facility]) - Progression Note    Patient Details  Name: Jacqueline [name] MRN: [id number] Date of Birth: 05/14/1946  Transition of [facility]) CM/SW Contact  Alfonso Rummer, LCSW Phone Number: 12/21/2024, 11:52 AM  Clinical Narrative:     KEN DELENA Rummer went into room 225. Pt was sleeping and family is not at bedside to discuss discharge planning. LCSW A. Holdyn Poyser called pt son [name] Spiegel at phone number listed in demographics tab unable to leave message due to mailbox not set up.                     Expected Discharge Plan and Services                                               Social Drivers of Health (SDOH) Interventions SDOH Screenings   Food Insecurity: No Food Insecurity (12/18/2024)  Housing: Low Risk (12/18/2024)  Transportation Needs: No Transportation Needs (12/18/2024)  Utilities: Not At Risk (12/18/2024)  Social Connections: Moderately Isolated (12/18/2024)  Tobacco Use: Medium Risk (12/18/2024)    Readmission Risk Interventions    05/22/2022   12:26 PM  Readmission Risk Prevention Plan  Transportation Screening Complete  PCP or Specialist Appt within 3-5 Days Complete  HRI or Home Care Consult Complete  Social Work Consult for Recovery Care Planning/Counseling Complete  Palliative Care Screening Complete  Medication Review Oceanographer) Complete

## 2024-12-21 NOTE — Progress Notes (Signed)
 Hemodialysis Note:  Received patient in bed to unit. Alert and oriented. Informed consent singed and in chart.  Treatment initiated: 1300 Treatment completed: 1633  Access used: Left AVF Access issues: None  Patient tolerated well. Transported back to room, alert without acute distress. Report given to patient's RN.  Total UF removed: 1 Liter Medications given: Midodrine  10 mg Tablet  Post HD weight: 93.6 kg  [facility]

## 2024-12-21 NOTE — Progress Notes (Signed)
 Initial Nutrition Assessment  DOCUMENTATION CODES:   Obesity unspecified  INTERVENTION:   -Liberalize diet to carb modified with 1.2 L fluid restriction -Renal MVI daily -Feeding assistance with meals -Nepro Shake po daily, each supplement provides 425 kcal and 19 grams protein  -RD will draw labs to assess for potential micronutrient deficiencies which may impede wound healing: vitamin C, zinc , copper , vitamin A, and CRP (to best interpret lab values)  NUTRITION DIAGNOSIS:   Increased nutrient needs related to chronic illness (ESRD on HD) as evidenced by estimated needs.  GOAL:   Patient will meet greater than or equal to 90% of their needs  MONITOR:   PO intake, Supplement acceptance  REASON FOR ASSESSMENT:   Consult Assessment of nutrition requirement/status, Diet education  ASSESSMENT:   79 y.o. female with medical history significant for A-fib on Eliquis , ESRD on HD (TTS), T2DM, HFpEF (LVEF 60-65%), COPD on chronic 1 L O2, anemia of CKD, HTN, CAD s/p DES to RCA (12/2016), GERD, HLD, morbid obesity who presented to the ED from [facility] on 12/18/2023 due to abdominal pain, constipation, shortness of breath, and cough, with concern for sigmoid volvulus on imaging.  Patient admitted with influenza A infection with possible superimposed pneumonia, COPD exacerbation, constipation, and fecal impaction.   1/19- s/p BSE- mechanical soft diet  Reviewed I/O's: +120 ml x 24 hours and -1.1 L since admission  Case discussed with SLP, who reports she downgraded patient to mechanical soft foods for ease of intake. She reports patent expressed frustration over inconsistent nutrition care/ diet recommendations PTA/ She is unsure is she should be on a renal diet or receive Nepro supplements and requesting RD assessment.   Spoke with patient at bedside, who reporyts feeling a little better today. She reports history of oral intake, which she attributes to food feeling like it  gets stuck in her throat. Per patient, the mechanical soft diet is helpful. She complaints of being constipated; her last bowel movement was yesterday.   Patient shares that diet is inconsistent and has been told different recommendations by different providers. She also expresses concern about low albumin  levels and how she often does not get much protein at SNF. Noted meal completions 0-75%. She likes Nepro supplements, but she is not always receiving them.   Reviewed weight history. Patient has experienced a 7.1% weight loss over the past month, which is significant for time frame. Patient reports that her EDW is around 102 kg, however, she suspects this may be inaccurate, as she is weighed with her wheelchair at HD.   Discussed importance of good meal and supplement intake to promote healing. Patient amenable to supplements. RD also lifted some diet restrictions to help promote adequate oral intake. Patient shares that her K and Phos have been historically low and her nephrologist liberalized her diet due to decreased oral intake and lab values. RD discussed lifting renal diet restrictions, but need to keep carb modified restriction for blood sugar control, which she is agreeable to. Patient also would like to keep Nepro supplement to daily, as she follows a 1.2 L fluid restriction and this is also what she received PTA and would like to be consistent in her care.   Medications reviewed and include dulcolax, phoslo , lactulose , protonix , prednisone , and midodrine .   Lab Results  Component Value Date   HGBA1C 6.0 (H) 06/21/2024   PTA DM medications are SSI U-100 TID. Per patient, she started experiencing hypoglycemic episodes PTA due to decreased oral intake. Reviewed  CBGS: readings have ranged from 88-219, usually ranging from 150-160 per SNF records.   Labs reviewed: K and Phos WDL. CBGS: 105-193 (inpatient orders for glycemic control are 0-20 units insulin  aspart TID with meals).    NUTRITION -  FOCUSED PHYSICAL EXAM:  Flowsheet Row Most Recent Value  Orbital Region No depletion  Upper Arm Region No depletion  Thoracic and Lumbar Region No depletion  Buccal Region No depletion  Temple Region No depletion  Clavicle Bone Region No depletion  Clavicle and Acromion Bone Region No depletion  Scapular Bone Region No depletion  Dorsal Hand Mild depletion  Patellar Region Mild depletion  Anterior Thigh Region Mild depletion  Posterior Calf Region Mild depletion  Edema (RD Assessment) Mild  Hair Reviewed  Eyes Reviewed  Mouth Reviewed  Skin Reviewed  Nails Reviewed    Diet Order:   Diet Order             Diet Carb Modified Fluid restriction: 1200 mL Fluid  Diet effective now                   EDUCATION NEEDS:   Education needs have been addressed  Skin:  Skin Assessment: Skin Integrity Issues: Skin Integrity Issues:: Stage II Stage II: right heel  Last BM:  12/19/24  Height:   Ht Readings from Last 1 Encounters:  12/18/24 5' 4 (1.626 m)    Weight:   Wt Readings from Last 1 Encounters:  12/21/24 94.6 kg    Ideal Body Weight:  54.5 kg  BMI:  Body mass index is 35.8 kg/m.  Estimated Nutritional Needs:   Kcal:  1700-1900  Protein:  90-105 grams  Fluid:  1.7-1.9 L    Margery ORN, RD, LDN, CDCES Registered Dietitian III Certified Diabetes Care and Education Specialist If unable to reach this RD, please use RD Inpatient group chat on secure chat between hours of 8am-4 pm daily

## 2024-12-21 NOTE — Progress Notes (Signed)
 " PROGRESS NOTE    Jacqueline [name]  FMW:[id number] DOB: [birth year] DOA: 12/17/2024 PCP: Austin Candyce Stall, MD    Brief Narrative:  79 y.o. female with medical history significant for A-fib on Eliquis , ESRD on HD (TTS), T2DM, HFpEF (LVEF 60-65%), COPD on chronic 1 L O2, anemia of CKD, HTN, CAD s/p DES to RCA (12/2016), GERD, HLD, morbid obesity who presented to the ED from [facility] on 12/18/2023 due to abdominal pain, constipation, shortness of breath, and cough, with concern for sigmoid volvulus on imaging.  Collateral obtained from staff at [facility], reported patient was complaining of significant abdominal pain with abdominal x-ray concerning for sigmoid volvulus.  They noted patient has been constipated with last documented bowel movement on 1/14 noted to be small and hard.  Further history obtained from the patient and her son, Jacqueline [name], over the phone, indicating the patient had been also complaining of 3 days of cough productive of clear sputum and shortness of breath, generally feeling unwell and missing her HD session on Thursday, which was deviation from her normal strict compliance with her HD sessions.    In the ED, she was afebrile with a temp of 98.5 F, HR 79, RR 20, BP 110/55, SpO2 96-100% on 2 L [place].  CBC showed no leukocytosis, Hgb 10.3, thrombocytopenia to 108.  CMP without acute abnormalities.  4 Plex PCR positive for influenza A.  proBNP elevated to >35,000.  High-sensitivity troponin 165 > 161. EKG showed A-fib with incomplete RBBB and ventricular rate of 86 without acute ST segment changes. CXR showed stable cardiomegaly with possible left retrocardiac opacity concerning for atelectasis versus infiltrate and possible effusion. Abdominal x-ray showed no abnormal bowel dilation and mild stool burden CT abdomen pelvis without contrast showed moderate volume stool within the rectum possibly presenting fecal impaction with small volume within the upstream colon and no abnormal  bowel dilation.  Noted partially imaged mildly patulous lower esophagus containing a few nodular densities measuring up to 8 mm.  Increased height loss of L5 compression fracture.  Trace bilateral pleural effusions.   She was admitted for shortness of breath in the setting of recently missed dialysis with influenza A and concern for superimposed pneumonia as well as constipation.  Assessment and Plan:  Influenza A infection with possible superimposed pneumonia Acute exacerbation of COPD - Presenting with 2-3 days of cough productive of clear sputum and increased SOB. End expiratory wheezing noted on exam bilaterally. - No leukocytosis, afebrile.  - CXR showed possible left retrocardiac opacity concerning for atelectasis versus infiltrate  - Procalcitonin elevated to 1.01 - IV Rocephin  and PO doxycycline  for 5 days - Tamiflu  30 mg every other day for 3 days - Received IV Solumedrol in the ED. Continue prednisone  40 mg for 5 days  - PRN DuoNebs - Continue home Anoro Ellipta  - Flutter valve  - Repeat procalcitonin (1/19) 0.82  Acute metabolic encephalopathy - resolved - Patient had episode of acute confusion on 1/18 with waxing and waning alertness, also with some delirious thoughts. When awakened, drowsy but oriented x 4. However, not at baseline at presentation, corroborated with son. Most likely secondary to hospital delirium, though she received oxycodone  5 mg around 6 AM but is on Norco 5-325 mg at home - Ammonia wnl - Lactic acid 1.5 - CT head noncon without acute abnormalities - Delirium precautions - Currently AAOx4  Constipation  Fecal impaction - resolving Diffuse abdominal pain  Nausea/Vomiting - Presented with concern for sigmoid volvulus on  outside KUB. Patient had been complaining of diffuse abdominal pain, with nausea and inability to keep down PO intake, sensation of fullness, and no bowel movements for several days. She is passing gas. Per [facility] staff, last  recorded BM on 1/14 noted to be small and hard - Suspect abdominal pain and N/V secondary to fecal impaction/constipation - Suspect constipation secondary to opioids and PO Auryxia   - CT abdomen pelvis showed no evidence of volvulus, noted moderate volume stool within rectum and small volume stool within upstream colon - Per [facility] staff, she is on Lactulose  20 g BID, senna x2 nightly, and PRN sorbitol  - Received mineral oil enema in ED - Lactulose  20 g TID, Senna BID, PRN Dulcolax suppository - Received SMOG enema 1/18 - Underwent manual disimpaction by RN 1/19 - Had no BM through this afternoon, ordered Nulytely  and Dulcolax suppository which were going to be given after HD. But was notified by RN that patient had a medium BM after HD, therefore, held off Nulytely  and Dulcolax   Chronic hypoxic respiratory failure - Uses 1L [place] at home (order appears to be for 2 L/min) - Placed on 2L [place] with SpO2 96-100% and no documented desaturation, do not suspect acute hypoxia at this time - Now on 1L O2 with SpO2 100% - Goal SpO2 88-92%   Volume overload - improving Bilateral pleural effusions, trace - ProBNP elevated to > 35,000 - CT abd/pelvis showed trace bilateral pleural effusions - Management with HD   ESRD on HD - TTS schedule, prior to presentation last HD on Tuesday , missed Thursday session - Nephrology consulted for HD - Last HD session 1/20   Type II MI - hsTroponins elevated to 165 > 161 - Denies chest pain - EKG without acute ST-segment changes - Likely secondary to demand ischemia given influenza and possible superimposed pneumonia in the setting of ESRD    L5 compression fracture - Previously noted on CT abd/pelvis in 11/2024 - CT abd/pelvis (1/16) showed increased height loss of L5 compression fracture - Likely contributing referred pain to abdominal area - PRN oxycodone  - Lidocaine  patch - Would benefit from outpatient referral to spine and consideration of  kyphoplasty if indicated   Patulous esophagus Esophageal dysphagia GERD - CT abdomen/pelvis showed mildly patulous lower esophagus containing a few nodular densities measuring up to 8 mm with recommendation for correlation with endoscopy - GI consulted, Dr. Therisa evaluated patient, indicated esophageal CT findings possibly food particles vs neoplasm, recommended barium swallow to rule out achalasia prior to discharge, then outpatient follow up if inconclusive for consideration of EGD - SLP evaluated patient - Continue Protonix  to 40 mg BID for now - Barium swallow prior to discharge   Thrombocytopenia - Unclear etiology - Stable - Monitor   T2DM - Hgb A1c (06/2024) 6.0 - Repeat A1c ordered - On SSI only at facility, reportedly refuses correctional insulin  often if BG < 200 - Continue SSI 0 - 20 AC - ACHS Accu Checks   Permanent AFib - Currently rate-controlled - Not on rate control agents - Continue Eliquis  2.5 mg twice daily   Chronic hypotension - Continue home Florinef  0.2 mg twice daily, midodrine  10 mg 3 times daily, droxidopa  200 mg 3 times daily  Gout - Continue home allopurinol  100 mg daily   HLD - Continue home Crestor  10 mg daily   CAD s/p DES to RCA - Continue Eliquis  and Crestor    Morbid Obesity (BMI Body mass index is 40.89 kg/m. kg/m) -  Obesity is clinically significant and complicates care - Recommend continued discussion with PCP re: lifestyle modification including dietary changes, regular physical activity, and weight reduction strategies.   Scheduled Meds:  allopurinol   100 mg Oral Daily   apixaban   2.5 mg Oral BID   bisacodyl   10 mg Rectal Once   calcium  acetate  667 mg Oral TID WC   doxycycline   100 mg Oral Q12H   droxidopa   200 mg Oral TID with meals   ezetimibe   10 mg Oral Daily   [START ON 12/22/2024] feeding supplement (NEPRO CARB STEADY)  237 mL Oral QHS   fludrocortisone   0.2 mg Oral BID   insulin  aspart  0-20 Units Subcutaneous TID WC    lactulose   20 g Oral TID   lidocaine   1 patch Transdermal Q24H   lidocaine   1 Application Topical BID   midodrine   10 mg Oral TID PC   multivitamin  1 tablet Oral QHS   pantoprazole   40 mg Oral BID   polyethylene glycol-electrolytes  2,000 mL Oral Once   predniSONE   40 mg Oral Q breakfast   rosuvastatin   10 mg Oral Daily   senna  1 tablet Oral BID   umeclidinium-vilanterol  1 puff Inhalation Daily   Continuous Infusions:  cefTRIAXone  (ROCEPHIN )  IV 1 g (12/21/24 2030)   PRN Meds:.acetaminophen  **OR** [DISCONTINUED] acetaminophen , bisacodyl , guaiFENesin -dextromethorphan , ipratropium-albuterol , naloxone , ondansetron  **OR** ondansetron  (ZOFRAN ) IV, oxyCODONE   Current Outpatient Medications  Medication Instructions   acetaminophen  (TYLENOL ) 650 mg, Oral, Every 8 hours, (0600, 1400 & 2200)   albuterol  (PROVENTIL ) 2.5 mg, Nebulization, Every 6 hours PRN   allopurinol  (ZYLOPRIM ) 100 mg, Oral, Daily   aluminum -magnesium  hydroxide-simethicone  (MAALOX) 200-200-20 MG/5ML SUSP 30 mLs, Oral, Every 4 hours PRN   apixaban  (ELIQUIS ) 2.5 mg, Oral, 2 times daily   benzonatate  (TESSALON ) 100 mg, Oral, 3 times daily PRN   bisacodyl  (DULCOLAX) 10 mg, Rectal, Daily PRN   cinacalcet  (SENSIPAR ) 30 mg, Oral, Daily with supper, (1800)   clotrimazole-betamethasone (LOTRISONE) cream 1 Application, Topical, See admin instructions, Apply as needed to affected area of ear for itching/irritation.    dextromethorphan -guaiFENesin  (MUCINEX  DM) 30-600 MG 12hr tablet 1 tablet, 2 times daily   Droxidopa  200 mg, Oral, 3 times daily with meals   ezetimibe  (ZETIA ) 10 mg, Oral, Daily   ferric citrate  (AURYXIA ) 420 mg, Oral, 3 times daily with meals   fexofenadine (ALLEGRA) 180 mg, Oral, Every morning, (0800)   fludrocortisone  (FLORINEF ) 0.2 mg, Oral, 2 times daily   fluticasone (FLONASE) 50 MCG/ACT nasal spray 1 spray, Each Nare, Every morning, (0900)   guaiFENesin -dextromethorphan  (ROBITUSSIN DM) 100-10 MG/5ML syrup 15  mLs, Oral, Every 4 hours PRN   HYDROcodone -acetaminophen  (NORCO/VICODIN) 5-325 MG tablet 1-2 tablets, Oral, Every 6 hours PRN   insulin  aspart (NOVOLOG ) 0-20 Units, Subcutaneous, 3 times daily with meals, For CBG 70-120: 0 units For CBG 121-150: 3 units For CBG 151-200: 4 units For CBG 201-250: 7 units For CBG 251-300: 11 units For CBG 301-350: 15 units For CBG 351-400: 20 units For CBG > 400: 25 units and call your doctor   ipratropium-albuterol  (DUONEB) 0.5-2.5 (3) MG/3ML SOLN 3 mLs, Nebulization, 2 times daily   lactulose  (CHRONULAC ) 20 g, Oral, 2 times daily   lidocaine  (LIDODERM ) 5 % 1 patch, Transdermal, Daily   Lidocaine  HCl (ASPERCREME LIDOCAINE ) 4 % CREA 1 Application, Topical, 2 times daily, Applied to bilateral knees   magnesium  hydroxide (MILK OF MAGNESIA) 400 MG/5ML suspension 30 mLs, Oral, Daily PRN  Menthol -Zinc  Oxide (MOISTURE BARRIER EX) 1 Application, Topical, As directed, Apply barrier cream to buttocks every shift   midodrine  (PROAMATINE ) 10 mg, Oral, 3 times daily   Multiple Vitamin (MULTIVITAMIN WITH MINERALS) TABS tablet 1 tablet, Oral, Daily at bedtime   naloxone  (NARCAN ) nasal spray 4 mg/0.1 mL 1 spray, Nasal, 3 times daily PRN   nitroGLYCERIN  (NITROSTAT ) 0.4 mg, Sublingual, Every 5 min PRN   ondansetron  (ZOFRAN -ODT) 4 mg, Oral, Every 8 hours PRN   oseltamivir  (TAMIFLU ) 30 mg, 2 times daily   OXYGEN  2 L/min, Inhalation, As needed   pantoprazole  (PROTONIX ) 40 mg, Oral, Daily   polyethylene glycol (MIRALAX  / GLYCOLAX ) 17 g, Oral, Daily   promethazine -dextromethorphan  (PROMETHAZINE -DM) 6.25-15 MG/5ML syrup 5 mLs, Oral, 3 times daily   rosuvastatin  (CRESTOR ) 10 mg, Oral, Daily   senna-docusate (SENOKOT-S) 8.6-50 MG tablet 2 tablets, Oral, Daily at bedtime   simethicone  (MYLICON) 80 mg, Oral, Daily PRN   sodium phosphate (FLEET) ENEM 1 enema, Rectal, Daily PRN   sorbitol  70 % solution 30 mLs, Oral, Daily PRN   umeclidinium-vilanterol (ANORO ELLIPTA ) 62.5-25 MCG/ACT  AEPB 1 puff, Daily    DVT prophylaxis: apixaban  (ELIQUIS ) tablet 2.5 mg Start: 12/19/24 1609 apixaban  (ELIQUIS ) tablet 2.5 mg   Code Status:   Code Status: Full Code  Family Communication: Son [name] at bedside  Disposition Plan: Back to [facility] when clinically improved PT -   -   OT -   -    Level of care: Telemetry  Consultants:  Nephrology, GI  Antimicrobials: IV Rocephin , PO Doxy   Subjective: Seen at bedside.  Reports she feels somewhat better but is still having some abdominal pain and is concerned about the lack of bowel movements. States she's passing gas and denies any nausea or vomiting. She is still having a globus sensation when swallowing food and is often coughing up food she just tried to swallow. She requests continued admission given her concern about her stools.  Note: the patient is very frustrated with her facility and the inconsistent care she perceives receiving there. Discussed that moving facilities is an option, but not something we can do much about in the hospital    Objective: Vitals:   12/21/24 1530 12/21/24 1633 12/21/24 2108 12/21/24 2119  BP: (!) 102/49 (!) 102/53 (!) 86/57 (!) 106/48  Pulse: 62 71 63 62  Resp: 16 19 16    Temp:  (!) 97.5 F (36.4 C) 97.6 F (36.4 C)   TempSrc:  Oral    SpO2: 100% 98% 96%   Weight:  93.6 kg    Height:        Intake/Output Summary (Last 24 hours) at 12/21/2024 2246 Last data filed at 12/21/2024 1633 Gross per 24 hour  Intake 120 ml  Output 1000 ml  Net -880 ml    Filed Weights   12/18/24 1615 12/21/24 1256 12/21/24 1633  Weight: 97 kg 94.6 kg 93.6 kg    Examination:  Gen: elderly female, NAD HEENT: NCAT Neck: Supple CV: RRR, no murmurs Resp: normal WOB, CTAB Abd: Soft, some tenderness to palpation in the RLQ, nondistended, no guarding, BS normoactive Ext: 1+ LE edema, pulses 2+ b/l, LUE AVF Skin: Warm, dry Neuro:  Awake, alert, oriented x 4, no focal motor or sensory deficits Psych:  Calm, cooperative   Data Reviewed: I have personally reviewed following labs and imaging studies  CBC: Recent Labs  Lab 12/17/24 1140 12/17/24 2105 12/18/24 0111 12/19/24 0852 12/20/24 0934 12/21/24 1320  WBC 6.1  6.6 7.4 8.7 8.6 9.9  NEUTROABS 5.6  --   --   --   --   --   HGB 10.3* 10.1* 10.8* 10.9* 10.8* 10.2*  HCT 33.2* 33.2* 34.9* 35.2* 34.3* 31.8*  MCV 103.4* 102.8* 101.5* 100.9* 100.0 99.4  PLT 108* 131* 122* 113* 123* 122*   Basic Metabolic Panel: Recent Labs  Lab 12/17/24 1140 12/17/24 2105 12/18/24 0111 12/19/24 0852 12/20/24 0934 12/21/24 0500  NA 136  --  137 136 137 138  K 4.4  --  4.4 4.0 4.3 3.8  CL 92*  --  91* 93* 93* 95*  CO2 29  --  24 27 28 23   GLUCOSE 181*  --  207* 150* 106* 193*  BUN 33*  --  41* 35* 46* 60*  CREATININE 6.23* 6.50* 6.69* 5.31* 6.34* 7.41*  CALCIUM  7.7*  --  7.9* 7.9* 7.7* 7.6*  MG  --   --   --   --   --  2.4  PHOS  --   --   --  2.4* 2.8 2.5   GFR: Estimated Creatinine Clearance: 6.6 mL/min (A) (by C-G formula based on SCr of 7.41 mg/dL (H)). Liver Function Tests: Recent Labs  Lab 12/17/24 1140 12/19/24 0852 12/20/24 0934 12/21/24 0500  AST 33  --   --   --   ALT 13  --   --   --   ALKPHOS 181*  --   --   --   BILITOT 0.3  --   --   --   PROT 6.6  --   --   --   ALBUMIN  3.5 3.7 3.4* 3.3*   No results for input(s): LIPASE, AMYLASE in the last 168 hours. Recent Labs  Lab 12/19/24 1538  AMMONIA 29   Coagulation Profile: No results for input(s): INR, PROTIME in the last 168 hours. Cardiac Enzymes: No results for input(s): CKTOTAL, CKMB, CKMBINDEX, TROPONINI in the last 168 hours. BNP (last 3 results) Recent Labs    12/17/24 1148  PROBNP >35,000.0*   HbA1C: No results for input(s): HGBA1C in the last 72 hours. CBG: Recent Labs  Lab 12/20/24 2114 12/21/24 0743 12/21/24 1109 12/21/24 1801 12/21/24 2106  GLUCAP 168* 193* 184* 115* 213*   Lipid Profile: No results for input(s):  CHOL, HDL, LDLCALC, TRIG, CHOLHDL, LDLDIRECT in the last 72 hours. Thyroid  Function Tests: No results for input(s): TSH, T4TOTAL, FREET4, T3FREE, THYROIDAB in the last 72 hours. Anemia Panel: No results for input(s): VITAMINB12, FOLATE, FERRITIN, TIBC, IRON , RETICCTPCT in the last 72 hours. Sepsis Labs: Recent Labs  Lab 12/17/24 2105 12/19/24 1538 12/19/24 1951 12/21/24 1320  PROCALCITON 1.01  --   --  0.82  LATICACIDVEN  --  1.5 1.2  --     Recent Results (from the past 240 hours)  Resp panel by RT-PCR (RSV, Flu A&B, Covid) Anterior Nasal Swab     Status: Abnormal   Collection Time: 12/17/24 11:40 AM   Specimen: Anterior Nasal Swab  Result Value Ref Range Status   SARS Coronavirus 2 by RT PCR NEGATIVE NEGATIVE Final    Comment: (NOTE) SARS-CoV-2 target nucleic acids are NOT DETECTED.  The SARS-CoV-2 RNA is generally detectable in upper respiratory specimens during the acute phase of infection. The lowest concentration of SARS-CoV-2 viral copies this assay can detect is 138 copies/mL. A negative result does not preclude SARS-Cov-2 infection and should not be used as the sole basis for treatment or other patient management decisions.  A negative result may occur with  improper specimen collection/handling, submission of specimen other than nasopharyngeal swab, presence of viral mutation(s) within the areas targeted by this assay, and inadequate number of viral copies(<138 copies/mL). A negative result must be combined with clinical observations, patient history, and epidemiological information. The expected result is Negative.  Fact Sheet for Patients:  bloggercourse.com  Fact Sheet for Healthcare Providers:  seriousbroker.it  This test is no t yet approved or cleared by the [place]  FDA and  has been authorized for detection and/or diagnosis of SARS-CoV-2 by FDA under an Emergency Use  Authorization (EUA). This EUA will remain  in effect (meaning this test can be used) for the duration of the COVID-19 declaration under Section 564(b)(1) of the Act, 21 U.S.C.section 360bbb-3(b)(1), unless the authorization is terminated  or revoked sooner.       Influenza A by PCR POSITIVE (A) NEGATIVE Final   Influenza B by PCR NEGATIVE NEGATIVE Final    Comment: (NOTE) The Xpert Xpress SARS-CoV-2/FLU/RSV plus assay is intended as an aid in the diagnosis of influenza from Nasopharyngeal swab specimens and should not be used as a sole basis for treatment. Nasal washings and aspirates are unacceptable for Xpert Xpress SARS-CoV-2/FLU/RSV testing.  Fact Sheet for Patients: bloggercourse.com  Fact Sheet for Healthcare Providers: seriousbroker.it  This test is not yet approved or cleared by the [place]  FDA and has been authorized for detection and/or diagnosis of SARS-CoV-2 by FDA under an Emergency Use Authorization (EUA). This EUA will remain in effect (meaning this test can be used) for the duration of the COVID-19 declaration under Section 564(b)(1) of the Act, 21 U.S.C. section 360bbb-3(b)(1), unless the authorization is terminated or revoked.     Resp Syncytial Virus by PCR NEGATIVE NEGATIVE Final    Comment: (NOTE) Fact Sheet for Patients: bloggercourse.com  Fact Sheet for Healthcare Providers: seriousbroker.it  This test is not yet approved or cleared by the [place]  FDA and has been authorized for detection and/or diagnosis of SARS-CoV-2 by FDA under an Emergency Use Authorization (EUA). This EUA will remain in effect (meaning this test can be used) for the duration of the COVID-19 declaration under Section 564(b)(1) of the Act, 21 U.S.C. section 360bbb-3(b)(1), unless the authorization is terminated or revoked.  Performed at [facility], [address] [place], [place] [address]   MRSA Next Gen by PCR, Nasal     Status: None   Collection Time: 12/18/24 12:49 AM   Specimen: Nasal Mucosa; Nasal Swab  Result Value Ref Range Status   MRSA by PCR Next Gen NOT DETECTED NOT DETECTED Final    Comment: (NOTE) The GeneXpert MRSA Assay (FDA approved for NASAL specimens only), is one component of a comprehensive MRSA colonization surveillance program. It is not intended to diagnose MRSA infection nor to guide or monitor treatment for MRSA infections. Test performance is not FDA approved in patients less than 34 years old. Performed at [facility], [address], [place], [place] [address]      Radiology Studies: [facility] [place] 1 View Result Date: 12/20/2024 CLINICAL DATA:  High risk for aspiration. EXAM: PORTABLE CHEST 1 VIEW COMPARISON:  12/17/2024 and CT chest 06/21/2024. FINDINGS: Trachea is midline. Heart is enlarged. Left perihilar consolidation with streaky opacification in the lower lobes. Small loculated left pleural effusion. IMPRESSION: 1. Left perihilar consolidation may be due to pneumonia. Difficult to exclude a mass. Consider CT chest with contrast in further evaluation, as clinically indicated. 2.  Streaky bibasilar atelectasis. 3. Small loculated left pleural effusion. Electronically Signed   By: Newell Eke M.D.   On: 12/20/2024 10:31    Scheduled Meds:  allopurinol   100 mg Oral Daily   apixaban   2.5 mg Oral BID   bisacodyl   10 mg Rectal Once   calcium  acetate  667 mg Oral TID WC   doxycycline   100 mg Oral Q12H   droxidopa   200 mg Oral TID with meals   ezetimibe   10 mg Oral Daily   [START ON 12/22/2024] feeding supplement (NEPRO CARB STEADY)  237 mL Oral QHS   fludrocortisone   0.2 mg Oral BID   insulin  aspart  0-20 Units Subcutaneous TID WC   lactulose   20 g Oral TID   lidocaine   1 patch Transdermal Q24H   lidocaine   1 Application Topical BID   midodrine   10 mg Oral TID PC   multivitamin  1  tablet Oral QHS   pantoprazole   40 mg Oral BID   polyethylene glycol-electrolytes  2,000 mL Oral Once   predniSONE   40 mg Oral Q breakfast   rosuvastatin   10 mg Oral Daily   senna  1 tablet Oral BID   umeclidinium-vilanterol  1 puff Inhalation Daily   Continuous Infusions:  cefTRIAXone  (ROCEPHIN )  IV 1 g (12/21/24 2030)     Unresulted Labs (From admission, onward)     Start     Ordered   12/21/24 1531  Vitamin A  Once,   R        12/21/24 1530   12/21/24 1531  Vitamin C  Once,   R        12/21/24 1530   12/21/24 1531  Copper , serum  Once,   R        12/21/24 1530   12/21/24 1531  Zinc   Once,   R        12/21/24 1530   12/21/24 1531  C-reactive protein  Once,   R        12/21/24 1530   12/17/24 1748  Strep pneumoniae urinary antigen  (COPD / Pneumonia / Cellulitis / Lower Extremity Wound (Diabetic Foot Infection))  Once,   R        12/17/24 1756   12/17/24 1748  Legionella Pneumophila Serogp 1 Ur Ag  (COPD / Pneumonia / Cellulitis / Lower Extremity Wound (Diabetic Foot Infection))  Once,   R        12/17/24 1756   Signed and Held  Renal function panel  Once,   R        Signed and Held   Signed and Held  CBC  Once,   R        Signed and Held             LOS:  LOS: 3 days   Time Spent: 45 minutes  Kerriann Kamphuis Al-Sultani, MD [facility]  If 7PM-7AM, please contact night-coverage  12/21/2024, 10:46 PM      "

## 2024-12-21 NOTE — Progress Notes (Signed)
 " [facility]  PROGRESS NOTE   Subjective:   Patient seen resting quietly in bed Alert No family at bedside Remains 1L [place], baseline  Objective:  Vital signs: Blood pressure (!) 120/93, pulse (!) 52, temperature 98.4 F (36.9 C), temperature source Axillary, resp. rate 18, height 5' 4 (1.626 m), weight 97 kg, SpO2 99%.  Intake/Output Summary (Last 24 hours) at 12/21/2024 1204 Last data filed at 12/21/2024 0900 Gross per 24 hour  Intake 120 ml  Output --  Net 120 ml   Filed Weights   12/18/24 0041 12/18/24 1305 12/18/24 1615  Weight: 99 kg 99 kg 97 kg     Physical Exam: General:  No acute distress  Head:  Normocephalic, atraumatic. Moist oral mucosal membranes  Eyes:  Anicteric  Lungs:   Clear to auscultation, normal effort  Heart:  S1S2 no rubs  Abdomen:   Soft, nontender, bowel sounds present  Extremities: No peripheral edema.  Neurologic:  Awake, alert, following commands  Skin:  No lesions  Access: Left aVF    Basic Metabolic Panel: Recent Labs  Lab 12/17/24 1140 12/17/24 2105 12/18/24 0111 12/19/24 0852 12/20/24 0934  NA 136  --  137 136 137  K 4.4  --  4.4 4.0 4.3  CL 92*  --  91* 93* 93*  CO2 29  --  24 27 28   GLUCOSE 181*  --  207* 150* 106*  BUN 33*  --  41* 35* 46*  CREATININE 6.23* 6.50* 6.69* 5.31* 6.34*  CALCIUM  7.7*  --  7.9* 7.9* 7.7*  PHOS  --   --   --  2.4* 2.8   GFR: Estimated Creatinine Clearance: 7.9 mL/min (A) (by C-G formula based on SCr of 6.34 mg/dL (H)).  Liver Function Tests: Recent Labs  Lab 12/17/24 1140 12/19/24 0852 12/20/24 0934  AST 33  --   --   ALT 13  --   --   ALKPHOS 181*  --   --   BILITOT 0.3  --   --   PROT 6.6  --   --   ALBUMIN  3.5 3.7 3.4*   No results for input(s): LIPASE, AMYLASE in the last 168 hours. Recent Labs  Lab 12/19/24 1538  AMMONIA 29    CBC: Recent Labs  Lab 12/17/24 1140 12/17/24 2105 12/18/24 0111 12/19/24 0852 12/20/24 0934  WBC 6.1 6.6 7.4 8.7 8.6   NEUTROABS 5.6  --   --   --   --   HGB 10.3* 10.1* 10.8* 10.9* 10.8*  HCT 33.2* 33.2* 34.9* 35.2* 34.3*  MCV 103.4* 102.8* 101.5* 100.9* 100.0  PLT 108* 131* 122* 113* 123*     HbA1C: Hemoglobin A1C  Date/Time Value Ref Range Status  06/12/2021 12:00 AM 6.6  Final   Hgb A1c MFr Bld  Date/Time Value Ref Range Status  06/21/2024 08:00 AM 6.0 (H) 4.8 - 5.6 % Final    Comment:    (NOTE) Diagnosis of Diabetes The following HbA1c ranges recommended by the American Diabetes Association (ADA) may be used as an aid in the diagnosis of diabetes mellitus.  Hemoglobin             Suggested A1C NGSP%              Diagnosis  <5.7                   Non Diabetic  5.7-6.4  Pre-Diabetic  >6.4                   Diabetic  <7.0                   Glycemic control for                       adults with diabetes.    06/07/2023 07:44 PM 5.8 (H) 4.8 - 5.6 % Final    Comment:    (NOTE) Pre diabetes:          5.7%-6.4%  Diabetes:              >6.4%  Glycemic control for   <7.0% adults with diabetes     Urinalysis: No results for input(s): COLORURINE, LABSPEC, PHURINE, GLUCOSEU, HGBUR, BILIRUBINUR, KETONESUR, PROTEINUR, UROBILINOGEN, NITRITE, LEUKOCYTESUR in the last 72 hours.  Invalid input(s): APPERANCEUR    Imaging: DG Chest Port 1 View Result Date: 12/20/2024 CLINICAL DATA:  High risk for aspiration. EXAM: PORTABLE CHEST 1 VIEW COMPARISON:  12/17/2024 and CT chest 06/21/2024. FINDINGS: Trachea is midline. Heart is enlarged. Left perihilar consolidation with streaky opacification in the lower lobes. Small loculated left pleural effusion. IMPRESSION: 1. Left perihilar consolidation may be due to pneumonia. Difficult to exclude a mass. Consider CT chest with contrast in further evaluation, as clinically indicated. 2. Streaky bibasilar atelectasis. 3. Small loculated left pleural effusion. Electronically Signed   By: Newell Eke M.D.   On:  12/20/2024 10:31   CT HEAD WO CONTRAST ( ) Result Date: 12/19/2024 EXAM: CT HEAD WITHOUT CONTRAST 12/19/2024 03:12:00 PM TECHNIQUE: CT of the head was performed without the administration of intravenous contrast. Automated exposure control, iterative reconstruction, and/or weight based adjustment of the mA/kV was utilized to reduce the radiation dose to as low as reasonably achievable. COMPARISON: 06/21/2024 CLINICAL HISTORY: Altered mental status, nontraumatic FINDINGS: BRAIN AND VENTRICLES: Mild generalized atrophy and white matter changes are stable, potentially within normal limits for age. Atherosclerotic calcifications within the cavernous internal carotid and vertebral arteries. No acute hemorrhage. No evidence of acute infarct. No hydrocephalus. No extra-axial collection. No mass effect or midline shift. ORBITS: Left lens replacement. SINUSES: Bilateral mastoid effusions. Chronic wall thickening is present in the right maxillary sinus. No active disease is present. SOFT TISSUES AND SKULL: No acute soft tissue abnormality. No skull fracture. IMPRESSION: 1. No acute intracranial abnormality or significant interval change . Electronically signed by: Lonni Necessary MD 12/19/2024 06:35 PM EST RP Workstation: [id number]     Medications:    cefTRIAXone  (ROCEPHIN )  IV 1 g (12/20/24 1528)    allopurinol   100 mg Oral Daily   apixaban   2.5 mg Oral BID   doxycycline   100 mg Oral Q12H   droxidopa   200 mg Oral TID with meals   ezetimibe   10 mg Oral Daily   feeding supplement (NEPRO CARB STEADY)  237 mL Oral BID BM   fludrocortisone   0.2 mg Oral BID   insulin  aspart  0-20 Units Subcutaneous TID WC   lactulose   20 g Oral TID   lidocaine   1 patch Transdermal Q24H   lidocaine   1 Application Topical BID   midodrine   10 mg Oral TID PC   multivitamin  1 tablet Oral QHS   pantoprazole   40 mg Oral BID   predniSONE   40 mg Oral Q breakfast   rosuvastatin   10 mg Oral Daily   senna  1 tablet Oral BID  umeclidinium-vilanterol  1 puff Inhalation Daily    Assessment/ Plan:     79 year old female with history of hypertension, coronary artery disease, diabetes, COPD, atrial fibrillation, end-stage renal disease on hemodialysis on Tuesday Thursday Saturday schedule.  She is now admitted with history of constipation and also respiratory illness.  She was found to have influenza A and is being treated for the same.  #1: ESRD on hemodialysis: Scheduled for dialysis later today. Will maintain outpatient TTS schedule  #2: Anemia with chronic kidney disease: Hemoglobin 10.8, acceptable for renal patient.  No need of ESA at this time.  #3: Secondary hyperparathyroidism: Will continue to monitor bone minerals.  #4: COPD/pneumonia: PCR positive for influenza A.  Increased oxygen  requirement.  Continue prednisone  and Rocephin  with doxycycline , per primary team.     LOS: 3 [facility] 1/20/202612:04 PM  "

## 2024-12-22 DIAGNOSIS — K219 Gastro-esophageal reflux disease without esophagitis: Secondary | ICD-10-CM | POA: Diagnosis not present

## 2024-12-22 DIAGNOSIS — I4821 Permanent atrial fibrillation: Secondary | ICD-10-CM | POA: Diagnosis not present

## 2024-12-22 DIAGNOSIS — J189 Pneumonia, unspecified organism: Secondary | ICD-10-CM | POA: Diagnosis not present

## 2024-12-22 DIAGNOSIS — N186 End stage renal disease: Secondary | ICD-10-CM | POA: Diagnosis not present

## 2024-12-22 DIAGNOSIS — J101 Influenza due to other identified influenza virus with other respiratory manifestations: Secondary | ICD-10-CM | POA: Diagnosis not present

## 2024-12-22 DIAGNOSIS — K5903 Drug induced constipation: Secondary | ICD-10-CM | POA: Diagnosis not present

## 2024-12-22 DIAGNOSIS — J441 Chronic obstructive pulmonary disease with (acute) exacerbation: Secondary | ICD-10-CM | POA: Diagnosis not present

## 2024-12-22 DIAGNOSIS — I9589 Other hypotension: Secondary | ICD-10-CM | POA: Diagnosis not present

## 2024-12-22 DIAGNOSIS — E1122 Type 2 diabetes mellitus with diabetic chronic kidney disease: Secondary | ICD-10-CM | POA: Diagnosis not present

## 2024-12-22 DIAGNOSIS — J9611 Chronic respiratory failure with hypoxia: Secondary | ICD-10-CM | POA: Diagnosis not present

## 2024-12-22 LAB — GLUCOSE, CAPILLARY
Glucose-Capillary: 102 mg/dL — ABNORMAL HIGH (ref 70–99)
Glucose-Capillary: 167 mg/dL — ABNORMAL HIGH (ref 70–99)
Glucose-Capillary: 289 mg/dL — ABNORMAL HIGH (ref 70–99)
Glucose-Capillary: 235 mg/dL — ABNORMAL HIGH (ref 70–99)

## 2024-12-22 LAB — C-REACTIVE PROTEIN: CRP: 0.9 mg/dL

## 2024-12-22 NOTE — Progress Notes (Signed)
 Mobility Specialist - Progress Note   12/22/24 1624  Mobility  Activity Pivoted/transferred from chair to bed  Level of Assistance Maximum assist, patient does 25-49%  Assistive Device  (HHA +2)  Distance Ambulated (ft) 2 ft  Activity Response Tolerated well  Mobility visit 1 Mobility  Mobility Specialist Start Time (ACUTE ONLY) 1557  Mobility Specialist Stop Time (ACUTE ONLY) 1605  Mobility Specialist Time Calculation (min) (ACUTE ONLY) 8 min   Pt sitting in the recliner upon, utilizing RA. She required MaxA +2 to stand and transfer to bed via SPT-- expressed pain once seated EOB. Pt returned supine, left with alarm set and needs within reach.  Jacqueline [name] Mobility Specialist 12/22/24 4:29 PM

## 2024-12-22 NOTE — TOC Progression Note (Signed)
 Transition of Care [facility]) - Progression Note    Patient Details  Name: Jacqueline [name] MRN: [id number] Date of Birth: 02/02/1946  Transition of Care [facility]) CM/SW Contact  Alfonso Rummer, LCSW Phone Number: 12/22/2024, 4:31 PM  Clinical Narrative:    LCSW A. Sharlene Mccluskey called pt son [name] at phone number listed in demographics tab. Unable to leave voicemail due to mailbox is full. LCSW A Rummer called pt son Darin Brower left message on voicemail.  Pt sleep in chair when attempting to conduct TOC visit. LCSW A Aws Shere did not wake up patient.                     Expected Discharge Plan and Services                                               Social Drivers of Health (SDOH) Interventions SDOH Screenings   Food Insecurity: No Food Insecurity (12/18/2024)  Housing: Low Risk (12/18/2024)  Transportation Needs: No Transportation Needs (12/18/2024)  Utilities: Not At Risk (12/18/2024)  Social Connections: Moderately Isolated (12/18/2024)  Tobacco Use: Medium Risk (12/18/2024)    Readmission Risk Interventions    05/22/2022   12:26 PM  Readmission Risk Prevention Plan  Transportation Screening Complete  PCP or Specialist Appt within 3-5 Days Complete  HRI or Home Care Consult Complete  Social Work Consult for Recovery Care Planning/Counseling Complete  Palliative Care Screening Complete  Medication Review Ocean[employer]) Complete

## 2024-12-22 NOTE — Hospital Course (Signed)
 79 y.o. female with medical history significant for A-fib on Eliquis , ESRD on HD (TTS), T2DM, HFpEF (LVEF 60-65%), COPD on chronic 1 L O2, anemia of CKD, HTN, CAD s/p DES to RCA (12/2016), GERD, HLD, morbid obesity who presented to the ED from [facility] on 12/18/2023 due to abdominal pain, constipation, shortness of breath, and cough.   Assessment and Plan:   Acute on chronic hypoxic respiratory failure - Requiring 2 L on presentation, baseline 1 L.  Appears to be resolved at this time.   Acute influenza A with possible superimposed bacterial pneumonia - Worsening cough and shortness of breath on presentation.  No leukocytosis, afebrile.  Chest x-ray noting possible left retrocardiac opacity.  Completed 5 days of ceftriaxone  plus doxycycline .  Tamiflu  every other day.  Prednisone  for 5 days.  Showing improvement.  Appears to be resolved at this time.   Acute metabolic encephalopathy - Acute confusion and lethargy on presentation.  Resolved after treatment with above.   Fecal impaction/constipation/diffuse abdominal pain - Initial presentation with intractable nausea vomiting and diffuse abdominal pain.  Outpatient imaging noted concern for sigmoid volvulus.  CT imaging here noting no volvulus.  Aggressive bowel regiment, underwent smog enema 1/18 with manual disimpaction by RN 1/19.  Bowel movement yesterday 1/20.  Continue current regimen of lactulose , senna, Dulcolax.   Acute exacerbation of HFpEF/volume overload - Elevated proBNP with bilateral pleural effusions on presentation.  Responding well to HD/UF per nephrology.   ESRD on HD - TTS schedule.  Nephrology on board and following closely.  Tolerated session 1/20.   Type II MI - Mildly elevated troponins but flat suggestive of demand ischemia likely from above.   L5 compression fracture (POA) - Noted on CT 11/2024, recheck CT showed worsening height loss of L5 compression fracture.  Lidocaine  patch, p.o. opioids as needed.    Diabetes mellitus - Insulin  sliding scale on board.   Permanent atrial fibrillation - Currently rate controlled.  Eliquis  2.5 twice daily.   Patulous esophagus/esophageal dysphagia - GI consulted, recommending barium swallow to rule out achalasia prior to discharge.  SLP following.  Protonix  40 mg twice daily.   Chronic hypotension -Continue home Florinef  0.2 mg twice daily, midodrine  10 mg 3 times daily, droxidopa  200 mg 3 times daily   CAD/HLD/CAD - Resume home medication regiment.   Morbid obesity - BMI greater than 40.  Recommending lifestyle modification, dietary changes.  Weight loss strategies.   Goals of care - Awaiting barium swallow.  Patient from [facility].  Anticipate discharge later today or tomorrow.

## 2024-12-22 NOTE — Plan of Care (Signed)

## 2024-12-22 NOTE — Evaluation (Signed)
 Physical Therapy Evaluation Patient Details Name: Jacqueline [name] MRN: [id number] DOB: [birth year] Today's Date: 12/22/2024  History of Present Illness  Pt is an 79 y/o F admitted on 12/17/24 after presenting with c/o abdominal pain, constipation, SOB, & cough. Pt is being treated for Influenza A with possible superimposed PNA, acute COPD exacerbation, constipation. PMH: a-fib on Eliquis , ESRD on HD TTS, DM2, HFpEF, COPD on chronic 1L O2, anemia of CKD, HTN, CAD s/p DES on RCA, GERD, HLD, morbid obesity  Clinical Impression  Pt seen for PT evaluation with pt agreeable. Pt reports she is [facility], ambulates to the bathroom but hasn't done this in ~2 weeks. On this date, pt requires min assist with hospital bed features for supine>sit. Pt attempts sit>stand multiple ways (HHA +1-2, with RW) with pt ultimately requiring max assist +1-2 for sit>stand, max assist +2 for pivot to [facility]. Pt is limited by BLE & BUE ROM. Recommend ongoing PT services to progress mobility as able.        If plan is discharge home, recommend the following: Two people to help with walking and/or transfers;Two people to help with bathing/dressing/bathroom;Assistance with feeding   Can travel by private vehicle   No    Equipment Recommendations None recommended by PT (defer to next venue)  Recommendations for Other Services       Functional Status Assessment Patient has had a recent decline in their functional status and demonstrates the ability to make significant improvements in function in a reasonable and predictable amount of time.     Precautions / Restrictions Precautions Precautions: Fall Restrictions Weight Bearing Restrictions Per Provider Order: No      Mobility  Bed Mobility Overal bed mobility: Needs Assistance Bed Mobility: Supine to Sit     Supine to sit: Min assist, HOB elevated, Used rails (exit L side of bed, extra time, assistance to upright trunk (holds to PTs hand & bed))           Transfers Overall transfer level: Needs assistance Equipment used: Rolling walker (2 wheels), 1 person hand held assist, 2 person hand held assist Transfers: Sit to/from Stand, Bed to chair/wheelchair/BSC Sit to Stand: Max assist, +2 physical assistance, +2 safety/equipment, From elevated surface (+1-2) Stand pivot transfers: Max assist, +2 physical assistance, From elevated surface, +2 safety/equipment (pt requires MAX<>TOTAL assist for pivot portion to [facility] on R)              Ambulation/Gait                  Stairs            Wheelchair Mobility     Tilt Bed    Modified Rankin (Stroke Patients Only)       Balance Overall balance assessment: Needs assistance Sitting-balance support: Feet supported Sitting balance-Leahy Scale: Fair Sitting balance - Comments: supervision static sitting   Standing balance support: Bilateral upper extremity supported, Reliant on assistive device for balance, During functional activity Standing balance-Leahy Scale: Zero                               Pertinent Vitals/Pain Pain Assessment Pain Assessment: Faces Faces Pain Scale: Hurts little more Pain Location: abdomen Pain Descriptors / Indicators: Cramping Pain Intervention(s): Monitored during session, Limited activity within patient's tolerance    Home Living Family/patient expects to be discharged to:: Skilled nursing facility  Additional Comments: Pt is resident at [facility].    Prior Function               Mobility Comments: Pt reports she was able to walk to the bathroom ~2 weeks ago with a walker, otherwise uses a w/c. ADLs Comments: Pt reports assistance for bathing, dressing, toileting.     Extremity/Trunk Assessment   Upper Extremity Assessment Upper Extremity Assessment: Generalized weakness    Lower Extremity Assessment Lower Extremity Assessment: Generalized weakness (unable to flex BLE knees past ~90  degrees with BLE feet on floor to place them under BOS)       Communication   Communication Communication: No apparent difficulties    Cognition Arousal: Alert Behavior During Therapy: WFL for tasks assessed/performed   PT - Cognitive impairments: Safety/Judgement                         Following commands: Impaired Following commands impaired: Follows one step commands with increased time     Cueing Cueing Techniques: Verbal cues     General Comments General comments (skin integrity, edema, etc.): Pt on 1L/min via nasal cannula throughout session, pt with continent BM & requires total assist for peri hygiene.    Exercises     Assessment/Plan    PT Assessment Patient needs continued PT services  PT Problem List Decreased strength;Pain;Cardiopulmonary status limiting activity;Decreased range of motion;Decreased activity tolerance;Decreased knowledge of use of DME;Decreased balance;Decreased safety awareness;Decreased knowledge of precautions;Decreased mobility       PT Treatment Interventions Therapeutic exercise;Gait training;Balance training;DME instruction;Stair training;Neuromuscular re-education;Functional mobility training;Therapeutic activities;Patient/family education    PT Goals (Current goals can be found in the Care Plan section)  Acute Rehab PT Goals Patient Stated Goal: get better PT Goal Formulation: With patient Time For Goal Achievement: 01/05/25 Potential to Achieve Goals: Good    Frequency Min 2X/week     Co-evaluation PT/OT/SLP Co-Evaluation/Treatment: Yes Reason for Co-Treatment: To address functional/ADL transfers;For patient/therapist safety PT goals addressed during session: Strengthening/ROM;Mobility/safety with mobility;Balance         AM-PAC PT 6 Clicks Mobility  Outcome Measure Help needed turning from your back to your side while in a flat bed without using bedrails?: A Little Help needed moving from lying on your back  to sitting on the side of a flat bed without using bedrails?: A Lot Help needed moving to and from a bed to a chair (including a wheelchair)?: Total Help needed standing up from a chair using your arms (e.g., wheelchair or bedside chair)?: Total Help needed to walk in hospital room?: Total Help needed climbing 3-5 steps with a railing? : Total 6 Click Score: 9    End of Session Equipment Utilized During Treatment: Oxygen  Activity Tolerance: Patient tolerated treatment well Patient left: in chair;with chair alarm set;with call bell/phone within reach (with meal tray set up) Nurse Communication: Mobility status PT Visit Diagnosis: Unsteadiness on feet (R26.81);Difficulty in walking, not elsewhere classified (R26.2);Other abnormalities of gait and mobility (R26.89);Muscle weakness (generalized) (M62.81)    Time: 9165-9089 PT Time Calculation (min) (ACUTE ONLY): 36 min   Charges:   PT Evaluation $PT Eval Low Complexity: 1 Low PT Treatments $Therapeutic Activity: 8-22 mins PT General Charges $$ ACUTE PT VISIT: 1 Visit         Richerd Pinal, PT, DPT 12/22/24, 9:27 AM   Richerd CHRISTELLA Pinal 12/22/2024, 9:25 AM

## 2024-12-22 NOTE — Evaluation (Signed)
 Occupational Therapy Evaluation Patient Details Name: Jacqueline [name] MRN: [id number] DOB: Jul 23, 1946 Today's Date: 12/22/2024   History of Present Illness   Pt is an 79 y/o F admitted on 12/17/24 after presenting with c/o abdominal pain, constipation, SOB, & cough. Pt is being treated for Influenza A with possible superimposed PNA, acute COPD exacerbation, constipation. PMH: a-fib on Eliquis , ESRD on HD TTS, DM2, HFpEF, COPD on chronic 1L O2, anemia of CKD, HTN, CAD s/p DES on RCA, GERD, HLD, morbid obesity     Clinical Impressions Patient presenting with decreased Ind in self care,balance, functional mobility, transfers, endurance, and safety awareness. Patient reports living at [facility] in LTC for several year but being able to ambulate at facility. She does need some assistance with self care tasks at baseline from staff. Pt needing max A of 2 to stand and step pivot to [facility] for safety. Pt having BM and needing to stand with one person while second assists her with hygiene. While pt is standing, BSC removed and recliner chair pulled up behind her. Pt seated and breakfast tray set up with food being cut and containers opened before therapist exits the room. Patient will benefit from acute OT to increase overall independence in the areas of ADLs, functional mobility, and safety awareness in order to safely discharge.     If plan is discharge home, recommend the following:   Two people to help with walking and/or transfers;A lot of help with bathing/dressing/bathroom;Assistance with cooking/housework;Assist for transportation;Help with stairs or ramp for entrance     Functional Status Assessment   Patient has had a recent decline in their functional status and demonstrates the ability to make significant improvements in function in a reasonable and predictable amount of time.     Equipment Recommendations   Other (comment) (defer)      Precautions/Restrictions    Precautions Precautions: Fall     Mobility Bed Mobility Overal bed mobility: Needs Assistance Bed Mobility: Supine to Sit     Supine to sit: Min assist, HOB elevated, Used rails          Transfers Overall transfer level: Needs assistance Equipment used: Rolling walker (2 wheels), 1 person hand held assist, 2 person hand held assist Transfers: Sit to/from Stand, Bed to chair/wheelchair/BSC Sit to Stand: Max assist, +2 physical assistance, +2 safety/equipment, From elevated surface Stand pivot transfers: Max assist, +2 physical assistance, From elevated surface, +2 safety/equipment                Balance Overall balance assessment: Needs assistance Sitting-balance support: Feet supported Sitting balance-Leahy Scale: Fair Sitting balance - Comments: supervision static sitting   Standing balance support: Bilateral upper extremity supported, Reliant on assistive device for balance, During functional activity Standing balance-Leahy Scale: Zero                             ADL either performed or assessed with clinical judgement   ADL Overall ADL's : Needs assistance/impaired                         Toilet Transfer: Maximal assistance;+2 for physical assistance;+2 for safety/equipment;Stand-pivot;Ambulation;BSC/3in1   Toileting- Clothing Manipulation and Hygiene: Maximal assistance;Sit to/from stand               Vision Patient Visual Report: No change from baseline              Pertinent Vitals/Pain  Pain Assessment Pain Assessment: Faces Faces Pain Scale: Hurts little more Pain Location: abdomen Pain Descriptors / Indicators: Cramping Pain Intervention(s): Monitored during session, Limited activity within patient's tolerance     Extremity/Trunk Assessment Upper Extremity Assessment Upper Extremity Assessment: Generalized weakness (B decreased shoulder elevation)   Lower Extremity Assessment Lower Extremity Assessment:  Generalized weakness       Communication Communication Communication: No apparent difficulties   Cognition Arousal: Alert Behavior During Therapy: WFL for tasks assessed/performed Cognition: No apparent impairments                               Following commands: Impaired Following commands impaired: Follows one step commands with increased time     Cueing  General Comments   Cueing Techniques: Verbal cues              Home Living Family/patient expects to be discharged to:: Skilled nursing facility                                 Additional Comments: Pt is resident at [facility].      Prior Functioning/Environment Prior Level of Function : Needs assist             Mobility Comments: Pt reports she was able to walk to the bathroom ~2 weeks ago with a walker, otherwise uses a w/c. ADLs Comments: Pt reports assistance for bathing, dressing, toileting.    OT Problem List: Decreased strength;Decreased safety awareness;Decreased activity tolerance;Impaired balance (sitting and/or standing)   OT Treatment/Interventions: Self-care/ADL training;Therapeutic activities;Therapeutic exercise;Energy conservation;Patient/family education;Balance training      OT Goals(Current goals can be found in the care plan section)   Acute Rehab OT Goals Patient Stated Goal: to go home OT Goal Formulation: With patient Time For Goal Achievement: 01/05/25 Potential to Achieve Goals: Fair ADL Goals Pt Will Perform Grooming: with contact guard assist;standing Pt Will Perform Lower Body Dressing: sit to/from stand;with min assist Pt Will Transfer to Toilet: with min assist;bedside commode;stand pivot transfer Pt Will Perform Toileting - Clothing Manipulation and hygiene: with min assist;sit to/from stand   OT Frequency:  Min 2X/week    Co-evaluation PT/OT/SLP Co-Evaluation/Treatment: Yes Reason for Co-Treatment: To address functional/ADL transfers;For  patient/therapist safety PT goals addressed during session: Strengthening/ROM;Mobility/safety with mobility;Balance OT goals addressed during session: ADL's and self-care      AM-PAC OT 6 Clicks Daily Activity     Outcome Measure Help from another person eating meals?: A Little Help from another person taking care of personal grooming?: A Little Help from another person toileting, which includes using toliet, bedpan, or urinal?: A Lot Help from another person bathing (including washing, rinsing, drying)?: A Lot Help from another person to put on and taking off regular upper body clothing?: A Lot Help from another person to put on and taking off regular lower body clothing?: A Lot 6 Click Score: 14   End of Session Equipment Utilized During Treatment: Rolling walker (2 wheels);Oxygen  Nurse Communication: Mobility status  Activity Tolerance: Patient tolerated treatment well Patient left: in chair;with call bell/phone within reach;with chair alarm set  OT Visit Diagnosis: Unsteadiness on feet (R26.81);Repeated falls (R29.6);Muscle weakness (generalized) (M62.81)                Time: 9143-9087 OT Time Calculation (min): 16 min Charges:  OT General Charges $OT Visit: 1 Visit OT Evaluation $OT  Eval Moderate Complexity: 1 [address], [place], OTR/L , [facility]IS ascom [phone]  12/22/24, 2:59 PM

## 2024-12-22 NOTE — Progress Notes (Signed)
 " [facility]  PROGRESS NOTE   Subjective:   Patient sitting up in chair Alert and oriented Remains weak  Objective:  Vital signs: Blood pressure (!) 87/46, pulse 72, temperature (!) 97.5 F (36.4 C), temperature source Oral, resp. rate 20, height 5' 4 (1.626 m), weight 93.6 kg, SpO2 100%.  Intake/Output Summary (Last 24 hours) at 12/22/2024 1110 Last data filed at 12/21/2024 1633 Gross per 24 hour  Intake --  Output 1000 ml  Net -1000 ml   Filed Weights   12/18/24 1615 12/21/24 1256 12/21/24 1633  Weight: 97 kg 94.6 kg 93.6 kg     Physical Exam: General:  No acute distress  Head:  Normocephalic, atraumatic. Moist oral mucosal membranes  Eyes:  Anicteric  Lungs:   Clear to auscultation, normal effort  Heart:  S1S2 no rubs  Abdomen:   Soft, nontender, bowel sounds present  Extremities: No peripheral edema.  Neurologic:  Awake, alert, following commands  Skin:  No lesions  Access: Left aVF    Basic Metabolic Panel: Recent Labs  Lab 12/17/24 1140 12/17/24 2105 12/18/24 0111 12/19/24 0852 12/20/24 0934 12/21/24 0500  NA 136  --  137 136 137 138  K 4.4  --  4.4 4.0 4.3 3.8  CL 92*  --  91* 93* 93* 95*  CO2 29  --  24 27 28 23   GLUCOSE 181*  --  207* 150* 106* 193*  BUN 33*  --  41* 35* 46* 60*  CREATININE 6.23* 6.50* 6.69* 5.31* 6.34* 7.41*  CALCIUM  7.7*  --  7.9* 7.9* 7.7* 7.6*  MG  --   --   --   --   --  2.4  PHOS  --   --   --  2.4* 2.8 2.5   GFR: Estimated Creatinine Clearance: 6.6 mL/min (A) (by C-G formula based on SCr of 7.41 mg/dL (H)).  Liver Function Tests: Recent Labs  Lab 12/17/24 1140 12/19/24 0852 12/20/24 0934 12/21/24 0500  AST 33  --   --   --   ALT 13  --   --   --   ALKPHOS 181*  --   --   --   BILITOT 0.3  --   --   --   PROT 6.6  --   --   --   ALBUMIN  3.5 3.7 3.4* 3.3*   No results for input(s): LIPASE, AMYLASE in the last 168 hours. Recent Labs  Lab 12/19/24 1538  AMMONIA 29    CBC: Recent Labs   Lab 12/17/24 1140 12/17/24 2105 12/18/24 0111 12/19/24 0852 12/20/24 0934 12/21/24 1320  WBC 6.1 6.6 7.4 8.7 8.6 9.9  NEUTROABS 5.6  --   --   --   --   --   HGB 10.3* 10.1* 10.8* 10.9* 10.8* 10.2*  HCT 33.2* 33.2* 34.9* 35.2* 34.3* 31.8*  MCV 103.4* 102.8* 101.5* 100.9* 100.0 99.4  PLT 108* 131* 122* 113* 123* 122*     HbA1C: Hemoglobin A1C  Date/Time Value Ref Range Status  06/12/2021 12:00 AM 6.6  Final   Hgb A1c MFr Bld  Date/Time Value Ref Range Status  06/21/2024 08:00 AM 6.0 (H) 4.8 - 5.6 % Final    Comment:    (NOTE) Diagnosis of Diabetes The following HbA1c ranges recommended by the American Diabetes Association (ADA) may be used as an aid in the diagnosis of diabetes mellitus.  Hemoglobin             Suggested  A1C NGSP%              Diagnosis  <5.7                   Non Diabetic  5.7-6.4                Pre-Diabetic  >6.4                   Diabetic  <7.0                   Glycemic control for                       adults with diabetes.    06/07/2023 07:44 PM 5.8 (H) 4.8 - 5.6 % Final    Comment:    (NOTE) Pre diabetes:          5.7%-6.4%  Diabetes:              >6.4%  Glycemic control for   <7.0% adults with diabetes     Urinalysis: No results for input(s): COLORURINE, LABSPEC, PHURINE, GLUCOSEU, HGBUR, BILIRUBINUR, KETONESUR, PROTEINUR, UROBILINOGEN, NITRITE, LEUKOCYTESUR in the last 72 hours.  Invalid input(s): APPERANCEUR    Imaging: No results found.    Medications:      allopurinol   100 mg Oral Daily   apixaban   2.5 mg Oral BID   bisacodyl   10 mg Rectal Once   calcium  acetate  667 mg Oral TID WC   droxidopa   200 mg Oral TID with meals   ezetimibe   10 mg Oral Daily   feeding supplement (NEPRO CARB STEADY)  237 mL Oral QHS   fludrocortisone   0.2 mg Oral BID   insulin  aspart  0-20 Units Subcutaneous TID WC   lactulose   20 g Oral TID   lidocaine   1 patch Transdermal Q24H   lidocaine   1 Application  Topical BID   midodrine   10 mg Oral TID PC   multivitamin  1 tablet Oral QHS   pantoprazole   40 mg Oral BID   polyethylene glycol-electrolytes  2,000 mL Oral Once   rosuvastatin   10 mg Oral Daily   senna  1 tablet Oral BID   umeclidinium-vilanterol  1 puff Inhalation Daily    Assessment/ Plan:     79 year old female with history of hypertension, coronary artery disease, diabetes, COPD, atrial fibrillation, end-stage renal disease on hemodialysis on Tuesday Thursday Saturday schedule.  She is now admitted with history of constipation and also respiratory illness.  She was found to have influenza A and is being treated for the same.  #1: ESRD on hemodialysis:Received dialysis yesterday, UF 1L achieved. Next treatment scheduled for Thursday.   #2: Anemia with chronic kidney disease: Hemoglobin 10.2. No need of ESA at this time.  #3: Secondary hyperparathyroidism: S calcium  slightly decreased. Phos at goal. Will continue to monitor.   #4: COPD/pneumonia: PCR positive for influenza A.  Increased oxygen  requirement.  Completed prednisone  and Rocephin  with doxycycline .     LOS: 4 [facility] 1/21/202611:10 AM  "

## 2024-12-22 NOTE — Progress Notes (Signed)
 " Progress Note   Patient: Jacqueline [name] FMW:[id number] DOB: [birth year] DOA: 12/17/2024  DOS: the patient was seen and examined on 12/22/2024   Brief hospital course:  79 y.o. female with medical history significant for A-fib on Eliquis , ESRD on HD (TTS), T2DM, HFpEF (LVEF 60-65%), COPD on chronic 1 L O2, anemia of CKD, HTN, CAD s/p DES to RCA (12/2016), GERD, HLD, morbid obesity who presented to the ED from [facility] on 12/18/2023 due to abdominal pain, constipation, shortness of breath, and cough.  Assessment and Plan:  Acute on chronic hypoxic respiratory failure - Requiring 2 L on presentation, baseline 1 L.  Appears to be resolved at this time.  Acute influenza A with possible superimposed bacterial pneumonia - Worsening cough and shortness of breath on presentation.  No leukocytosis, afebrile.  Chest x-ray noting possible left retrocardiac opacity.  Completed 5 days of ceftriaxone  plus doxycycline .  Tamiflu  every other day.  Prednisone  for 5 days.  Showing improvement.  Appears to be resolved at this time.  Acute metabolic encephalopathy - Acute confusion and lethargy on presentation.  Resolved after treatment with above.  Fecal impaction/constipation/diffuse abdominal pain - Initial presentation with intractable nausea vomiting and diffuse abdominal pain.  Outpatient imaging noted concern for sigmoid volvulus.  CT imaging here noting no volvulus.  Aggressive bowel regiment, underwent smog enema 1/18 with manual disimpaction by RN 1/19.  Bowel movement yesterday 1/20.  Continue current regimen of lactulose , senna, Dulcolax.  Acute exacerbation of HFpEF/volume overload - Elevated proBNP with bilateral pleural effusions on presentation.  Responding well to HD/UF per nephrology.  ESRD on HD - TTS schedule.  Nephrology on board and following closely.  Tolerated session 1/20.  Type II MI - Mildly elevated troponins but flat suggestive of demand ischemia likely from above.  L5  compression fracture (POA) - Noted on CT 11/2024, recheck CT showed worsening height loss of L5 compression fracture.  Lidocaine  patch, p.o. opioids as needed.  Diabetes mellitus - Insulin  sliding scale on board.  Permanent atrial fibrillation - Currently rate controlled.  Eliquis  2.5 twice daily.  Patulous esophagus/esophageal dysphagia - GI consulted, recommending barium swallow to rule out achalasia prior to discharge.  SLP following.  Protonix  40 mg twice daily.  Chronic hypotension -Continue home Florinef  0.2 mg twice daily, midodrine  10 mg 3 times daily, droxidopa  200 mg 3 times daily  CAD/HLD/CAD - Resume home medication regiment.  Morbid obesity - BMI greater than 40.  Recommending lifestyle modification, dietary changes.  Weight loss strategies.  Goals of care - Awaiting barium swallow.  Patient from [facility].  Anticipate discharge later today or tomorrow.  Subjective: Patient resting comfortably this morning.  States she was able to have a bowel movement yesterday.  Denies any worsening shortness of breath, fever, chills, chest pain, nausea, vomiting, abdominal pain.  Concerned about receiving her hemodialysis after discharge.  Physical Exam:  Vitals:   12/21/24 2119 12/22/24 0430 12/22/24 0441 12/22/24 0753  BP: (!) 106/48 (!) 86/52 (!) 95/44 (!) 87/46  Pulse: 62 68 (!) 57 72  Resp:  20    Temp:  (!) 97.5 F (36.4 C)  (!) 97.5 F (36.4 C)  TempSrc:    Oral  SpO2:  96%  100%  Weight:      Height:        GENERAL:  Alert, pleasant, no acute distress, obese HEENT:  EOMI CARDIOVASCULAR: Irregularly irregular RESPIRATORY:  Clear to auscultation, no wheezing, rales, or rhonchi GASTROINTESTINAL:  Soft, nontender,  nondistended EXTREMITIES:  No LE edema bilaterally NEURO:  No new focal deficits appreciated SKIN:  No rashes noted PSYCH:  Appropriate mood and affect, anxious    Data Reviewed:  Imaging Studies: DG Chest Port 1 View Result Date:  12/20/2024 CLINICAL DATA:  High risk for aspiration. EXAM: PORTABLE CHEST 1 VIEW COMPARISON:  12/17/2024 and CT chest 06/21/2024. FINDINGS: Trachea is midline. Heart is enlarged. Left perihilar consolidation with streaky opacification in the lower lobes. Small loculated left pleural effusion. IMPRESSION: 1. Left perihilar consolidation may be due to pneumonia. Difficult to exclude a mass. Consider CT chest with contrast in further evaluation, as clinically indicated. 2. Streaky bibasilar atelectasis. 3. Small loculated left pleural effusion. Electronically Signed   By: Newell Eke M.D.   On: 12/20/2024 10:31   CT HEAD WO CONTRAST ( ) Result Date: 12/19/2024 EXAM: CT HEAD WITHOUT CONTRAST 12/19/2024 03:12:00 PM TECHNIQUE: CT of the head was performed without the administration of intravenous contrast. Automated exposure control, iterative reconstruction, and/or weight based adjustment of the mA/kV was utilized to reduce the radiation dose to as low as reasonably achievable. COMPARISON: 06/21/2024 CLINICAL HISTORY: Altered mental status, nontraumatic FINDINGS: BRAIN AND VENTRICLES: Mild generalized atrophy and white matter changes are stable, potentially within normal limits for age. Atherosclerotic calcifications within the cavernous internal carotid and vertebral arteries. No acute hemorrhage. No evidence of acute infarct. No hydrocephalus. No extra-axial collection. No mass effect or midline shift. ORBITS: Left lens replacement. SINUSES: Bilateral mastoid effusions. Chronic wall thickening is present in the right maxillary sinus. No active disease is present. SOFT TISSUES AND SKULL: No acute soft tissue abnormality. No skull fracture. IMPRESSION: 1. No acute intracranial abnormality or significant interval change . Electronically signed by: Lonni Necessary MD 12/19/2024 06:35 PM EST RP Workstation: [id number]   CT ABDOMEN PELVIS WO CONTRAST Result Date: 12/17/2024 CLINICAL DATA:  Constipation and  vomiting EXAM: CT ABDOMEN AND PELVIS WITHOUT CONTRAST TECHNIQUE: Multidetector CT imaging of the abdomen and pelvis was performed following the standard protocol without IV contrast. RADIATION DOSE REDUCTION: This exam was performed according to the departmental dose-optimization program which includes automated exposure control, adjustment of the mA and/or kV according to patient size and/or use of iterative reconstruction technique. COMPARISON:  Same-day abdominal radiograph, CT abdomen and pelvis dated 11/25/2024 FINDINGS: Lower chest: No focal consolidation or pulmonary nodule in the lung bases. Trace bilateral pleural effusions. Multichamber cardiomegaly. Coronary artery calcifications. Hepatobiliary: No focal hepatic lesions. No intra or extrahepatic biliary ductal dilation. Cholelithiasis. Pancreas: No focal lesions or main ductal dilation. Spleen: Normal in size without focal abnormality. Adrenals/Urinary Tract: No adrenal nodules. Atrophic kidneys containing multifocal bilateral cysts, largest arising exophytically from the left interpolar kidney measuring 9.8 cm (4:42). No suspicious renal mass, calculi or hydronephrosis. Decompressed urinary bladder. Stomach/Bowel: Partially imaged mildly patulous lower esophagus containing a few nodular densities measuring up to 8 mm (4:17). Normal appearance of the stomach. No evidence of bowel wall thickening, distention, or inflammatory changes. Moderate volume stool within the rectum. Sigmoid colon is located within the right lower quadrant. Small volume stool within the upstream colon. Appy normal appendix. Appendix is not discretely seen. Vascular/Lymphatic: Aortic atherosclerosis. No enlarged abdominal or pelvic lymph nodes. Reproductive: No adnexal masses. Other: No free fluid, fluid collection, or free air. Musculoskeletal: Increased height loss of L5 compression fracture. Unchanged subtle rounded sclerotic focus within the left iliac (4:70). Multilevel  degenerative changes of the partially imaged thoracic and lumbar spine. Left lateral flank subcutaneous soft tissue edema. IMPRESSION: 1.  Moderate volume stool within the rectum may represent fecal impaction. Small volume stool within the upstream colon. No abnormal bowel dilation. 2. Partially imaged mildly patulous lower esophagus containing a few nodular densities measuring up to 8 mm. Consider correlation with endoscopy. 3. Increased height loss of L5 compression fracture. 4. Trace bilateral pleural effusions. 5.  Aortic Atherosclerosis (ICD10-I70.0). Electronically Signed   By: Limin  Xu M.D.   On: 12/17/2024 16:29   DG Abdomen 1 View Result Date: 12/17/2024 CLINICAL DATA:  Constipation, vomiting EXAM: ABDOMEN - 1 VIEW COMPARISON:  November 25, 2024 FINDINGS: The bowel gas pattern is normal. Mild amount of stool is seen in the colon and rectum. No radio-opaque calculi or other significant radiographic abnormality are seen. IMPRESSION: No abnormal bowel dilatation. Mild stool burden. Electronically Signed   By: Lynwood Landy Raddle M.D.   On: 12/17/2024 12:46   DG Chest 2 View Result Date: 12/17/2024 CLINICAL DATA:  Cough EXAM: CHEST - 2 VIEW COMPARISON:  November 25, 2024 FINDINGS: Stable cardiomegaly. Possible left retrocardiac opacity concerning for atelectasis or infiltrate and possible effusion. Bony thorax is unremarkable. IMPRESSION: Possible left retrocardiac opacity as described above. Electronically Signed   By: Lynwood Landy Raddle M.D.   On: 12/17/2024 12:45   CT ABDOMEN PELVIS W CONTRAST Result Date: 11/25/2024 EXAM: CT ABDOMEN AND PELVIS WITH CONTRAST 11/25/2024 02:15:02 PM TECHNIQUE: CT of the abdomen and pelvis was performed with the administration of 80 mL iohexol  (OMNIPAQUE ) 300 MG/ML solution. Multiplanar reformatted images are provided for review. Automated exposure control, iterative reconstruction, and/or weight-based adjustment of the mA/kV was utilized to reduce the radiation dose to as  low as reasonably achievable. COMPARISON: None available. CLINICAL HISTORY: Bowel obstruction suspected. FINDINGS: LOWER CHEST: No acute abnormality. LIVER: The liver is unremarkable. GALLBLADDER AND BILE DUCTS: Gallbladder is unremarkable. No biliary ductal dilatation. SPLEEN: No acute abnormality. PANCREAS: Diffusely atrophic pancreas. No focal lesion. Otherwise normal pancreatic contour. No surrounding inflammatory changes. No main pancreatic ductal dilatation. ADRENAL GLANDS: No acute abnormality. KIDNEYS, URETERS AND BLADDER: Atrophic bilateral kidneys. Bilateral renal cortical scarring. Bilateral low-density lesions that likely represent simple renal cysts. Per consensus, no follow-up is needed for simple Bosniak type 1 and 2 renal cysts, unless the patient has a malignancy history or risk factors. Subcentimeter hypodensities too small to characterize - no further follow-up indicated. No stones in the kidneys or ureters. No hydronephrosis. No perinephric or periureteral stranding. Urinary bladder is unremarkable. GI AND BOWEL: Stomach demonstrates no acute abnormality. No small or large bowel thickening or dilatation. The appendix is unremarkable. Colonic diverticulosis. There is no bowel obstruction. PERITONEUM AND RETROPERITONEUM: No ascites. No free air. VASCULATURE: Aorta is normal in caliber. LYMPH NODES: No lymphadenopathy. REPRODUCTIVE ORGANS: Status post hysterectomy. No adnexal mass. BONES AND SOFT TISSUES: Diffusely decreased bone density. Aged indeterminate, possibly acute, superior endplate and anterior wall fracture of the L5 vertebral body. Associated underlying at least 25% vertebral body height loss. No definite suspicious lytic or blastic osseous lesion. Moderate atherosclerotic plaque. No focal soft tissue abnormality. IMPRESSION: 1. Aged indeterminate, possibly acute, superior endplate and anterior wall fracture of the L5 vertebral body with at least 25% height loss. Orally with point  tenderness to palpation to evaluate for an acute component. 2. Colonic diverticulosis with no acute diverticulitis. 3. Bilateral atrophic kidneys. 4. Status post hysterectomy. 5. Electronically signed by: Morgane Naveau MD 11/25/2024 02:30 PM EST RP Workstation: [id number]   DG Chest 1 View Result Date: 11/25/2024 CLINICAL DATA:  Constipation with nausea  and generalized abdominal pain. EXAM: CHEST  1 VIEW COMPARISON:  July 20, 2023 FINDINGS: The cardiac silhouette is enlarged and unchanged in size. There is marked severity calcification of the aortic arch. Low lung volumes are noted without evidence of acute infiltrate, pleural effusion or pneumothorax. A 1.4 cm well-defined nodular opacity is seen overlying the soft tissues of the right breast. This may represent an overlying nipple shadow. Multilevel degenerative changes are seen throughout the thoracic spine. IMPRESSION: Low lung volumes without evidence of acute or active cardiopulmonary disease. Electronically Signed   By: Suzen Dials M.D.   On: 11/25/2024 12:27   DG Abdomen 1 View Result Date: 11/25/2024 CLINICAL DATA:  Constipation. EXAM: ABDOMEN - 1 VIEW COMPARISON:  03/03/2022 FINDINGS: Diffuse gaseous distention of small bowel identified without overtly obstructive appearance. There is some gas scattered along colonic loops with stool visible in the rectum. Bones appear demineralized. IMPRESSION: Diffuse gaseous distention of small bowel without overtly obstructive appearance. Imaging features may be related to ileus. Colon is nondistended with some minimal stool visible in the rectum. Electronically Signed   By: Camellia Candle M.D.   On: 11/25/2024 12:24    There are no new results to review at this time.  Previous records (including but not limited to H&P, progress notes, nursing notes, TOC management) were reviewed in assessment of this patient.  Labs: CBC: Recent Labs  Lab 12/17/24 1140 12/17/24 2105 12/18/24 0111  12/19/24 0852 12/20/24 0934 12/21/24 1320  WBC 6.1 6.6 7.4 8.7 8.6 9.9  NEUTROABS 5.6  --   --   --   --   --   HGB 10.3* 10.1* 10.8* 10.9* 10.8* 10.2*  HCT 33.2* 33.2* 34.9* 35.2* 34.3* 31.8*  MCV 103.4* 102.8* 101.5* 100.9* 100.0 99.4  PLT 108* 131* 122* 113* 123* 122*   Basic Metabolic Panel: Recent Labs  Lab 12/17/24 1140 12/17/24 2105 12/18/24 0111 12/19/24 0852 12/20/24 0934 12/21/24 0500  NA 136  --  137 136 137 138  K 4.4  --  4.4 4.0 4.3 3.8  CL 92*  --  91* 93* 93* 95*  CO2 29  --  24 27 28 23   GLUCOSE 181*  --  207* 150* 106* 193*  BUN 33*  --  41* 35* 46* 60*  CREATININE 6.23* 6.50* 6.69* 5.31* 6.34* 7.41*  CALCIUM  7.7*  --  7.9* 7.9* 7.7* 7.6*  MG  --   --   --   --   --  2.4  PHOS  --   --   --  2.4* 2.8 2.5   Liver Function Tests: Recent Labs  Lab 12/17/24 1140 12/19/24 0852 12/20/24 0934 12/21/24 0500  AST 33  --   --   --   ALT 13  --   --   --   ALKPHOS 181*  --   --   --   BILITOT 0.3  --   --   --   PROT 6.6  --   --   --   ALBUMIN  3.5 3.7 3.4* 3.3*   CBG: Recent Labs  Lab 12/21/24 0743 12/21/24 1109 12/21/24 1801 12/21/24 2106 12/22/24 0751  GLUCAP 193* 184* 115* 213* 102*    Scheduled Meds:  allopurinol   100 mg Oral Daily   apixaban   2.5 mg Oral BID   bisacodyl   10 mg Rectal Once   calcium  acetate  667 mg Oral TID WC   droxidopa   200 mg Oral TID with meals   ezetimibe   10 mg Oral Daily   feeding supplement (NEPRO CARB STEADY)  237 mL Oral QHS   fludrocortisone   0.2 mg Oral BID   insulin  aspart  0-20 Units Subcutaneous TID WC   lactulose   20 g Oral TID   lidocaine   1 patch Transdermal Q24H   lidocaine   1 Application Topical BID   midodrine   10 mg Oral TID PC   multivitamin  1 tablet Oral QHS   pantoprazole   40 mg Oral BID   polyethylene glycol-electrolytes  2,000 mL Oral Once   rosuvastatin   10 mg Oral Daily   senna  1 tablet Oral BID   umeclidinium-vilanterol  1 puff Inhalation Daily   Continuous Infusions: PRN  Meds:.acetaminophen  **OR** [DISCONTINUED] acetaminophen , bisacodyl , guaiFENesin -dextromethorphan , ipratropium-albuterol , naloxone , ondansetron  **OR** ondansetron  (ZOFRAN ) IV, oxyCODONE   Family Communication: None at bedside  Disposition: Status is: Inpatient Remains inpatient appropriate because: ESRD     Time spent: 40 minutes  Length of inpatient stay: 4 days  Author: Carliss LELON Canales, DO 12/22/2024 11:01 AM  For on call review [email or web address].   "

## 2024-12-23 ENCOUNTER — Inpatient Hospital Stay

## 2024-12-23 DIAGNOSIS — N186 End stage renal disease: Secondary | ICD-10-CM | POA: Diagnosis not present

## 2024-12-23 DIAGNOSIS — K219 Gastro-esophageal reflux disease without esophagitis: Secondary | ICD-10-CM

## 2024-12-23 DIAGNOSIS — J101 Influenza due to other identified influenza virus with other respiratory manifestations: Secondary | ICD-10-CM | POA: Diagnosis not present

## 2024-12-23 DIAGNOSIS — I4821 Permanent atrial fibrillation: Secondary | ICD-10-CM | POA: Diagnosis not present

## 2024-12-23 LAB — CBC WITH DIFFERENTIAL/PLATELET
Abs Immature Granulocytes: 0.06 K/uL (ref 0.00–0.07)
Basophils Absolute: 0 K/uL (ref 0.0–0.1)
Basophils Relative: 0 %
Eosinophils Absolute: 0 K/uL (ref 0.0–0.5)
Eosinophils Relative: 0 %
HCT: 30.1 % — ABNORMAL LOW (ref 36.0–46.0)
Hemoglobin: 9.5 g/dL — ABNORMAL LOW (ref 12.0–15.0)
Immature Granulocytes: 1 %
Lymphocytes Relative: 6 %
Lymphs Abs: 0.5 K/uL — ABNORMAL LOW (ref 0.7–4.0)
MCH: 31.8 pg (ref 26.0–34.0)
MCHC: 31.6 g/dL (ref 30.0–36.0)
MCV: 100.7 fL — ABNORMAL HIGH (ref 80.0–100.0)
Monocytes Absolute: 0.6 K/uL (ref 0.1–1.0)
Monocytes Relative: 7 %
Neutro Abs: 8.4 K/uL — ABNORMAL HIGH (ref 1.7–7.7)
Neutrophils Relative %: 86 %
Platelets: 122 K/uL — ABNORMAL LOW (ref 150–400)
RBC: 2.99 MIL/uL — ABNORMAL LOW (ref 3.87–5.11)
RDW: 18.4 % — ABNORMAL HIGH (ref 11.5–15.5)
WBC: 9.7 K/uL (ref 4.0–10.5)
nRBC: 0 % (ref 0.0–0.2)

## 2024-12-23 LAB — GLUCOSE, CAPILLARY
Glucose-Capillary: 115 mg/dL — ABNORMAL HIGH (ref 70–99)
Glucose-Capillary: 134 mg/dL — ABNORMAL HIGH (ref 70–99)
Glucose-Capillary: 106 mg/dL — ABNORMAL HIGH (ref 70–99)

## 2024-12-23 LAB — RENAL FUNCTION PANEL
Albumin: 3.3 g/dL — ABNORMAL LOW (ref 3.5–5.0)
Albumin: 3.7 g/dL (ref 3.5–5.0)
Anion gap: 13 (ref 5–15)
Anion gap: 15 (ref 5–15)
BUN: 39 mg/dL — ABNORMAL HIGH (ref 8–23)
BUN: 18 mg/dL (ref 8–23)
CO2: 27 mmol/L (ref 22–32)
CO2: 29 mmol/L (ref 22–32)
Calcium: 8 mg/dL — ABNORMAL LOW (ref 8.9–10.3)
Calcium: 8.3 mg/dL — ABNORMAL LOW (ref 8.9–10.3)
Chloride: 96 mmol/L — ABNORMAL LOW (ref 98–111)
Chloride: 97 mmol/L — ABNORMAL LOW (ref 98–111)
Creatinine, Ser: 5.63 mg/dL — ABNORMAL HIGH (ref 0.44–1.00)
Creatinine, Ser: 3.45 mg/dL — ABNORMAL HIGH (ref 0.44–1.00)
GFR, Estimated: 7 mL/min — ABNORMAL LOW
GFR, Estimated: 13 mL/min — ABNORMAL LOW
Glucose, Bld: 225 mg/dL — ABNORMAL HIGH (ref 70–99)
Glucose, Bld: 112 mg/dL — ABNORMAL HIGH (ref 70–99)
Phosphorus: 2.1 mg/dL — ABNORMAL LOW (ref 2.5–4.6)
Phosphorus: 1.1 mg/dL — ABNORMAL LOW (ref 2.5–4.6)
Potassium: 3.9 mmol/L (ref 3.5–5.1)
Potassium: 3.3 mmol/L — ABNORMAL LOW (ref 3.5–5.1)
Sodium: 136 mmol/L (ref 135–145)
Sodium: 142 mmol/L (ref 135–145)

## 2024-12-23 LAB — ZINC: Zinc: 91 ug/dL (ref 44–115)

## 2024-12-23 LAB — CBC
HCT: 34.1 % — ABNORMAL LOW (ref 36.0–46.0)
Hemoglobin: 10.8 g/dL — ABNORMAL LOW (ref 12.0–15.0)
MCH: 31.8 pg (ref 26.0–34.0)
MCHC: 31.7 g/dL (ref 30.0–36.0)
MCV: 100.3 fL — ABNORMAL HIGH (ref 80.0–100.0)
Platelets: 155 K/uL (ref 150–400)
RBC: 3.4 MIL/uL — ABNORMAL LOW (ref 3.87–5.11)
RDW: 18.4 % — ABNORMAL HIGH (ref 11.5–15.5)
WBC: 14.1 K/uL — ABNORMAL HIGH (ref 4.0–10.5)
nRBC: 0 % (ref 0.0–0.2)

## 2024-12-23 LAB — COPPER, SERUM: Copper: 127 ug/dL (ref 76–142)

## 2024-12-23 MED ORDER — HEPARIN SODIUM (PORCINE) 1000 UNIT/ML DIALYSIS: 1000.0000 [IU] | INTRAMUSCULAR | Status: DC | PRN

## 2024-12-23 MED ORDER — LIDOCAINE-PRILOCAINE 2.5-2.5 % EX CREA: 1.0000 | TOPICAL_CREAM | CUTANEOUS | Status: DC | PRN

## 2024-12-23 MED ORDER — MIDODRINE HCL 5 MG PO TABS
ORAL_TABLET | ORAL | Status: AC
  Filled 2024-12-23: qty 2

## 2024-12-23 MED ORDER — PENTAFLUOROPROP-TETRAFLUOROETH EX AERO
INHALATION_SPRAY | CUTANEOUS | Status: AC
  Filled 2024-12-23: qty 30

## 2024-12-23 MED ORDER — LIDOCAINE-PRILOCAINE 2.5-2.5 % EX CREA
1.0000 | TOPICAL_CREAM | CUTANEOUS | Status: DC | PRN
  Filled 2024-12-23: qty 5

## 2024-12-23 MED ORDER — POTASSIUM CHLORIDE 20 MEQ PO PACK: 40.0000 meq | PACK | Freq: Once | ORAL | Status: DC

## 2024-12-23 MED ORDER — ALTEPLASE 2 MG IJ SOLR
2.0000 mg | Freq: Once | INTRAMUSCULAR | Status: DC | PRN
  Filled 2024-12-23: qty 2

## 2024-12-23 MED ORDER — PENTAFLUOROPROP-TETRAFLUOROETH EX AERO
1.0000 | INHALATION_SPRAY | CUTANEOUS | Status: DC | PRN
  Filled 2024-12-23: qty 30

## 2024-12-23 MED ORDER — ANTICOAGULANT SODIUM CITRATE 4% (200MG/5ML) IV SOLN
5.0000 mL | Status: DC | PRN
  Filled 2024-12-23: qty 5

## 2024-12-23 MED ORDER — PENTAFLUOROPROP-TETRAFLUOROETH EX AERO: 1.0000 | INHALATION_SPRAY | CUTANEOUS | Status: DC | PRN

## 2024-12-23 MED ORDER — POTASSIUM CHLORIDE 20 MEQ PO PACK
20.0000 meq | PACK | Freq: Once | ORAL | Status: AC
  Administered 2024-12-23: 20 meq via ORAL
  Filled 2024-12-23: qty 1

## 2024-12-23 MED ORDER — LIDOCAINE HCL (PF) 1 % IJ SOLN
5.0000 mL | INTRAMUSCULAR | Status: DC | PRN
  Filled 2024-12-23: qty 5

## 2024-12-23 NOTE — Plan of Care (Signed)

## 2024-12-23 NOTE — Progress Notes (Addendum)
 " [facility]  PROGRESS NOTE   Subjective:   Patient seen and evaluated during dialysis   HEMODIALYSIS FLOWSHEET:  Blood Flow Rate (mL/min): 399 mL/min Arterial Pressure (mmHg): -154.74 mmHg Venous Pressure (mmHg): 224.84 mmHg TMP (mmHg): 6.66 mmHg Ultrafiltration Rate (mL/min): 686 mL/min Dialysate Flow Rate (mL/min): 299 ml/min  Tolerating treatment well No complaints  Objective:  Vital signs: Blood pressure (!) 91/48, pulse 65, temperature 98 F (36.7 C), temperature source Oral, resp. rate 14, height 5' 4 (1.626 m), weight 94.5 kg, SpO2 100%. No intake or output data in the 24 hours ending 12/23/24 1122  Filed Weights   12/21/24 1256 12/21/24 1633 12/23/24 0829  Weight: 94.6 kg 93.6 kg 94.5 kg     Physical Exam: General:  No acute distress  Head:  Normocephalic, atraumatic. Moist oral mucosal membranes  Eyes:  Anicteric  Lungs:   Clear to auscultation, normal effort  Heart:  S1S2 no rubs, irregular  Abdomen:   Soft, nontender, bowel sounds present  Extremities: No peripheral edema.  Neurologic:  Awake, alert, following commands  Skin:  No lesions  Access: Left aVF    Basic Metabolic Panel: Recent Labs  Lab 12/18/24 0111 12/19/24 0852 12/20/24 0934 12/21/24 0500 12/23/24 0900  NA 137 136 137 138 136  K 4.4 4.0 4.3 3.8 3.9  CL 91* 93* 93* 95* 96*  CO2 24 27 28 23 27   GLUCOSE 207* 150* 106* 193* 225*  BUN 41* 35* 46* 60* 39*  CREATININE 6.69* 5.31* 6.34* 7.41* 5.63*  CALCIUM  7.9* 7.9* 7.7* 7.6* 8.0*  MG  --   --   --  2.4  --   PHOS  --  2.4* 2.8 2.5 2.1*   GFR: Estimated Creatinine Clearance: 8.7 mL/min (A) (by C-G formula based on SCr of 5.63 mg/dL (H)).  Liver Function Tests: Recent Labs  Lab 12/17/24 1140 12/19/24 0852 12/20/24 0934 12/21/24 0500 12/23/24 0900  AST 33  --   --   --   --   ALT 13  --   --   --   --   ALKPHOS 181*  --   --   --   --   BILITOT 0.3  --   --   --   --   PROT 6.6  --   --   --   --   ALBUMIN   3.5 3.7 3.4* 3.3* 3.3*   No results for input(s): LIPASE, AMYLASE in the last 168 hours. Recent Labs  Lab 12/19/24 1538  AMMONIA 29    CBC: Recent Labs  Lab 12/17/24 1140 12/17/24 2105 12/18/24 0111 12/19/24 0852 12/20/24 0934 12/21/24 1320 12/23/24 0900  WBC 6.1   < > 7.4 8.7 8.6 9.9 9.7  NEUTROABS 5.6  --   --   --   --   --  8.4*  HGB 10.3*   < > 10.8* 10.9* 10.8* 10.2* 9.5*  HCT 33.2*   < > 34.9* 35.2* 34.3* 31.8* 30.1*  MCV 103.4*   < > 101.5* 100.9* 100.0 99.4 100.7*  PLT 108*   < > 122* 113* 123* 122* 122*   < > = values in this interval not displayed.     HbA1C: Hemoglobin A1C  Date/Time Value Ref Range Status  06/12/2021 12:00 AM 6.6  Final   Hgb A1c MFr Bld  Date/Time Value Ref Range Status  06/21/2024 08:00 AM 6.0 (H) 4.8 - 5.6 % Final    Comment:    (NOTE) Diagnosis of  Diabetes The following HbA1c ranges recommended by the American Diabetes Association (ADA) may be used as an aid in the diagnosis of diabetes mellitus.  Hemoglobin             Suggested A1C NGSP%              Diagnosis  <5.7                   Non Diabetic  5.7-6.4                Pre-Diabetic  >6.4                   Diabetic  <7.0                   Glycemic control for                       adults with diabetes.    06/07/2023 07:44 PM 5.8 (H) 4.8 - 5.6 % Final    Comment:    (NOTE) Pre diabetes:          5.7%-6.4%  Diabetes:              >6.4%  Glycemic control for   <7.0% adults with diabetes     Urinalysis: No results for input(s): COLORURINE, LABSPEC, PHURINE, GLUCOSEU, HGBUR, BILIRUBINUR, KETONESUR, PROTEINUR, UROBILINOGEN, NITRITE, LEUKOCYTESUR in the last 72 hours.  Invalid input(s): APPERANCEUR    Imaging: No results found.    Medications:      allopurinol   100 mg Oral Daily   apixaban   2.5 mg Oral BID   bisacodyl   10 mg Rectal Once   calcium  acetate  667 mg Oral TID WC   droxidopa   200 mg Oral TID with meals    ezetimibe   10 mg Oral Daily   feeding supplement (NEPRO CARB STEADY)  237 mL Oral QHS   fludrocortisone   0.2 mg Oral BID   insulin  aspart  0-20 Units Subcutaneous TID WC   lactulose   20 g Oral TID   lidocaine   1 patch Transdermal Q24H   lidocaine   1 Application Topical BID   midodrine   10 mg Oral TID PC   multivitamin  1 tablet Oral QHS   pantoprazole   40 mg Oral BID   polyethylene glycol-electrolytes  2,000 mL Oral Once   rosuvastatin   10 mg Oral Daily   senna  1 tablet Oral BID   umeclidinium-vilanterol  1 puff Inhalation Daily    Assessment/ Plan:     79 year old female with history of hypertension, coronary artery disease, diabetes, COPD, atrial fibrillation, end-stage renal disease on hemodialysis on Tuesday Thursday Saturday schedule.  She is now admitted with history of constipation and also respiratory illness.  She was found to have influenza A and is being treated for the same.  #1: ESRD on hemodialysis: Receiving dialysis today, UF goal 1.5 L as tolerated.  Next treatment scheduled for Saturday.  Patient cleared to discharge from renal stance and continue outpatient treatments.  Will defer discharge plan to primary team.  #2: Anemia with chronic kidney disease: Hemoglobin 9.5. No need of ESA at this time.  #3: Secondary hyperparathyroidism: Calcium  acceptable however phosphorus decreased.  May consider holding phosphorus binders.  #4: COPD/pneumonia: PCR positive for influenza A.  Increased oxygen  requirement.  Completed prednisone  and Rocephin  with doxycycline .     LOS: 5 [facility] 1/22/202611:22 AM  "

## 2024-12-23 NOTE — [place] FL2 (Signed)
 " [place]  MEDICAID FL2 LEVEL OF CARE FORM     IDENTIFICATION  Patient Name: Jacqueline [name] Birthdate: [birth year] Sex: female Admission Date (Current Location): 12/17/2024  [facility] [place] and [place] Number:  Chiropodist and Address:         Provider Number: [id number]  Attending Physician Name and Address:  Arlon Carliss ORN, DO  Relative Name and Phone Number:       Current Level of Care: Hospital Recommended Level of Care: Skilled Nursing Facility Prior Approval Number:    Date Approved/Denied:   PASRR Number: [id number]  Discharge Plan: SNF    Current Diagnoses: Patient Active Problem List   Diagnosis Date Noted   Pneumonia 12/17/2024   Chronic heart failure with preserved ejection fraction (HFpEF) (HCC) 12/17/2024   Anemia of chronic kidney failure 12/17/2024   Chronic hypoxic respiratory failure (HCC) 12/17/2024   GERD (gastroesophageal reflux disease) 12/17/2024   Influenza A 12/17/2024   Compression fracture of L5 vertebra (HCC) 12/17/2024   COPD with acute exacerbation (HCC) 10/08/2024   Acute bronchitis 06/22/2024   Obesity (BMI 30-39.9) 06/22/2024   Atrial fibrillation, chronic (HCC) 06/21/2024   Goals of care, counseling/discussion 06/12/2023   Severe pulmonary hypertension (HCC) 06/10/2023   Morbid obesity (HCC) 06/08/2023   Hyperkalemia 09/30/2022   Constipation 09/30/2022   AMS (altered mental status) 09/28/2022   Chronic hypotension    Permanent atrial fibrillation (HCC)    Pressure injury of skin 05/22/2022   General weakness 05/22/2022   Weakness 05/21/2022   Anaphylactic shock, unspecified, initial encounter 06/29/2020   Sepsis (HCC) 06/22/2019   Pain due to onychomycosis of toenails of both feet 06/11/2019   Callus 06/11/2019   Other disorders of phosphorus metabolism 05/20/2019   HLD (hyperlipidemia) 03/22/2019   Otalgia of both ears 03/22/2019   Right arm pain 03/22/2019   Bradycardia 10/01/2017   Dyslipidemia 10/01/2017    Dependence on renal dialysis 09/01/2017   Chest pain, unspecified 12/06/2016   Non-ST elevation (NSTEMI) myocardial infarction (HCC) 12/06/2016   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   History of NSTEMI    CAD (coronary artery disease) 12/03/2016   Hypokalemia 05/03/2016   Pruritus, unspecified 03/27/2015   Encounter for removal of sutures 11/30/2014   Pain in lower limb 08/26/2014   Encounter for immunization 06/10/2014   Unspecified protein-calorie malnutrition 02/28/2014   Allergy, unspecified, initial encounter 02/15/2014   HCAP (healthcare-associated pneumonia) 02/13/2014   Absence of both cervix and uterus, acquired 02/01/2014   Coagulation defect, unspecified 02/01/2014   Dyspnea, unspecified 02/01/2014   Family history of diabetes mellitus 02/01/2014   Family history of ischemic heart disease and other diseases of the circulatory system 02/01/2014   Fever, unspecified 02/01/2014   Headache, unspecified 02/01/2014   History of allergy to sulfonamides 02/01/2014   Iron  deficiency anemia, unspecified 02/01/2014   Long term (current) use of insulin  (HCC) 02/01/2014   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   OSA (obstructive sleep apnea) 01/21/2014   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HCC) 01/21/2014   Acute encephalopathy 01/19/2014   Congestive heart failure (HCC) 01/15/2014   ESRD (end stage renal disease) (HCC) 01/15/2014   Hypertension 01/15/2014   Type 2 diabetes mellitus with end-stage renal disease (HCC) 01/15/2014   Anemia 01/15/2014   Onychomycosis 03/26/2013   Pain in joint, ankle and foot 03/26/2013    Orientation RESPIRATION BLADDER Height & Weight     Self, Time, Situation, Place  Normal  (anuric) Weight: 94.5 kg Height:  5' 4 (162.6 cm)  BEHAVIORAL  SYMPTOMS/MOOD NEUROLOGICAL BOWEL NUTRITION STATUS      Continent Diet (Carb modified fluid restriction 1200)  AMBULATORY STATUS COMMUNICATION OF NEEDS Skin   Total Care Verbally PU Stage and  Appropriate Care, Other (Comment) (Wound noted to left lateral back)                       Personal Care Assistance Level of Assistance              Functional Limitations Info             SPECIAL CARE FACTORS FREQUENCY  PT (By licensed PT), OT (By licensed OT)                    Contractures Contractures Info: Not present    Additional Factors Info  Code Status, Allergies Code Status Info: Full Allergies Info: Sulfa Antibiotics, Sulfa Drugs Cross Reactors           Current Medications (12/23/2024):  This is the current hospital active medication list Current Facility-Administered Medications  Medication Dose Route Frequency Provider Last Rate Last Admin   acetaminophen  (TYLENOL ) tablet 975 mg  975 mg Oral Q6H PRN Al-Sultani, Anmar, MD   975 mg at 12/17/24 2012   allopurinol  (ZYLOPRIM ) tablet 100 mg  100 mg Oral Daily Al-Sultani, Anmar, MD   100 mg at 12/23/24 1304   alteplase  (CATHFLO ACTIVASE ) injection 2 mg  2 mg Intracatheter Once PRN Korrapati, Madhu, MD       alteplase  (CATHFLO ACTIVASE ) injection 2 mg  2 mg Intracatheter Once PRN Druscilla Bald, NP       anticoagulant sodium citrate  solution 5 mL  5 mL Intracatheter PRN Korrapati, Madhu, MD       apixaban  (ELIQUIS ) tablet 2.5 mg  2.5 mg Oral BID Al-Sultani, Anmar, MD   2.5 mg at 12/23/24 1304   bisacodyl  (DULCOLAX) suppository 10 mg  10 mg Rectal Daily PRN Al-Sultani, Anmar, MD       bisacodyl  (DULCOLAX) suppository 10 mg  10 mg Rectal Once Al-Sultani, Anmar, MD       calcium  acetate (PHOSLO ) capsule 667 mg  667 mg Oral TID WC Al-Sultani, Anmar, MD   667 mg at 12/23/24 1304   droxidopa  (NORTHERA ) capsule 200 mg  200 mg Oral TID with meals Al-Sultani, Anmar, MD   200 mg at 12/23/24 1702   ezetimibe  (ZETIA ) tablet 10 mg  10 mg Oral Daily Al-Sultani, Anmar, MD   10 mg at 12/23/24 1304   feeding supplement (NEPRO CARB STEADY) liquid 237 mL  237 mL Oral QHS Al-Sultani, Anmar, MD   237 mL at 12/22/24 2218    fludrocortisone  (FLORINEF ) tablet 0.2 mg  0.2 mg Oral BID Al-Sultani, Anmar, MD   0.2 mg at 12/23/24 1304   guaiFENesin -dextromethorphan  (ROBITUSSIN DM) 100-10 MG/5ML syrup 5 mL  5 mL Oral Q4H PRN Al-Sultani, Anmar, MD   5 mL at 12/21/24 2123   heparin  injection 1,000 Units  1,000 Units Intracatheter PRN Korrapati, Madhu, MD       heparin  injection 1,000 Units  1,000 Units Intracatheter PRN Druscilla Bald, NP       heparin  injection 1,000 Units  1,000 Units Intracatheter PRN Druscilla Bald, NP       insulin  aspart (novoLOG ) injection 0-20 Units  0-20 Units Subcutaneous TID WC Al-Sultani, Anmar, MD   3 Units at 12/23/24 1703   ipratropium-albuterol  (DUONEB) 0.5-2.5 (3) MG/3ML nebulizer solution 3 mL  3 mL Nebulization Q4H PRN  Al-Sultani, Anmar, MD   3 mL at 12/23/24 0115   lactulose  (CHRONULAC ) 10 GM/15ML solution 20 g  20 g Oral TID Al-Sultani, Anmar, MD   20 g at 12/23/24 1702   lidocaine  (LIDODERM ) 5 % 1 patch  1 patch Transdermal Q24H Al-Sultani, Anmar, MD   1 patch at 12/22/24 2210   lidocaine  (LMX) 4 % cream 1 Application  1 Application Topical BID Al-Sultani, Anmar, MD   1 Application at 12/23/24 1304   lidocaine  (PF) (XYLOCAINE ) 1 % injection 5 mL  5 mL Intradermal PRN Dominica Brandy, MD       lidocaine -prilocaine  (EMLA ) cream 1 Application  1 Application Topical PRN Korrapati, Madhu, MD       lidocaine -prilocaine  (EMLA ) cream 1 Application  1 Application Topical PRN Druscilla Bald, NP       midodrine  (PROAMATINE ) tablet 10 mg  10 mg Oral TID PC Al-Sultani, Anmar, MD   10 mg at 12/23/24 1702   multivitamin (RENA-VIT) tablet 1 tablet  1 tablet Oral QHS Al-Sultani, Anmar, MD   1 tablet at 12/23/24 1304   naloxone  (NARCAN ) nasal spray 4 mg/0.1 mL  1 spray Nasal TID PRN Al-Sultani, Anmar, MD       ondansetron  (ZOFRAN ) tablet 4 mg  4 mg Oral Q6H PRN Al-Sultani, Anmar, MD       Or   ondansetron  (ZOFRAN ) injection 4 mg  4 mg Intravenous Q6H PRN Al-Sultani, Anmar, MD   4 mg at 12/19/24  0830   oxyCODONE  (Oxy IR/ROXICODONE ) immediate release tablet 5 mg  5 mg Oral Q6H PRN Al-Sultani, Anmar, MD       pantoprazole  (PROTONIX ) EC tablet 40 mg  40 mg Oral BID Al-Sultani, Anmar, MD   40 mg at 12/23/24 1304   pentafluoroprop-tetrafluoroeth (GEBAUERS) aerosol 1 Application  1 Application Topical PRN Korrapati, Madhu, MD       pentafluoroprop-tetrafluoroeth (GEBAUERS) aerosol 1 Application  1 Application Topical PRN Druscilla Bald, NP       polyethylene glycol-electrolytes (NuLYTELY ) solution 2,000 mL  2,000 mL Oral Once Al-Sultani, Anmar, MD       rosuvastatin  (CRESTOR ) tablet 10 mg  10 mg Oral Daily Al-Sultani, Anmar, MD   10 mg at 12/23/24 1304   senna (SENOKOT) tablet 8.6 mg  1 tablet Oral BID Al-Sultani, Anmar, MD   8.6 mg at 12/23/24 1304   umeclidinium-vilanterol (ANORO ELLIPTA ) 62.5-25 MCG/ACT 1 puff  1 puff Inhalation Daily Al-Sultani, Anmar, MD   1 puff at 12/23/24 1303     Discharge Medications: Please see discharge summary for a list of discharge medications.  Relevant Imaging Results:  Relevant Lab Results:   Additional Information [id number]  Corean ONEIDA Haddock, RN     "

## 2024-12-23 NOTE — TOC Progression Note (Addendum)
 Transition of Care [facility]) - Progression Note    Patient Details  Name: Jacqueline [name] MRN: [id number] Date of Birth: 05-26-1946  [facility]) CM/SW Contact  Corean ONEIDA Haddock, RN Phone Number: 12/23/2024, 6:44 PM  Clinical Narrative:      Patient confirms plan to return to [facility] at dc.  Therapy recommending SNF [facility] with [facility] to start auth. If auth denied patient can return LTC    Update: per [employer] is not managed by [facility], messaged sent to [place] at [facility] to notify her that they would have to start auth               Expected Discharge Plan and Services                                               Social Drivers of Health (SDOH) Interventions SDOH Screenings   Food Insecurity: No Food Insecurity (12/18/2024)  Housing: Low Risk (12/18/2024)  Transportation Needs: No Transportation Needs (12/18/2024)  Utilities: Not At Risk (12/18/2024)  Social Connections: Moderately Isolated (12/18/2024)  Tobacco Use: Medium Risk (12/18/2024)    Readmission Risk Interventions    05/22/2022   12:26 PM  Readmission Risk Prevention Plan  Transportation Screening Complete  PCP or Specialist Appt within 3-5 Days Complete  HRI or Home Care Consult Complete  Social Work Consult for Recovery Care Planning/Counseling Complete  Palliative Care Screening Complete  Medication Review [employer]) Complete

## 2024-12-23 NOTE — Progress Notes (Signed)
 Physical Therapy Treatment Patient Details Name: Jacqueline [name] MRN: [id number] DOB: 10-04-1946 Today's Date: 12/23/2024   History of Present Illness Pt is an 79 y/o F admitted on 12/17/24 after presenting with c/o abdominal pain, constipation, SOB, & cough. Pt is being treated for Influenza A with possible superimposed PNA, acute COPD exacerbation, constipation. PMH: a-fib on Eliquis , ESRD on HD TTS, DM2, HFpEF, COPD on chronic 1L O2, anemia of CKD, HTN, CAD s/p DES on RCA, GERD, HLD, morbid obesity    PT Comments  Patient seen for PT session focused on OOB tolerance.  Attempted to stand and transfer to chair in room maxA x2; attempt failed pt returned to bed. Patient shows fair potential to make progress with continued acute level rehab. Patient continues to demonstrate moderate to severe activity restrictions and poor tolerance for progressive mobility. Continued skilled PT recommended to progress toward functional goals and support discharge readiness. Pt making good progress toward goals, will continue to follow POC. Discharge recommendation remains appropriate     If plan is discharge home, recommend the following: Two people to help with walking and/or transfers;Two people to help with bathing/dressing/bathroom;Assistance with feeding   Can travel by private vehicle     No  Equipment Recommendations  None recommended by PT    Recommendations for Other Services       Precautions / Restrictions Precautions Precautions: Fall Restrictions Weight Bearing Restrictions Per Provider Order: No     Mobility  Bed Mobility Overal bed mobility: Needs Assistance Bed Mobility: Supine to Sit     Supine to sit: HOB elevated, Used rails, Max assist          Transfers Overall transfer level: Needs assistance Equipment used: Rolling walker (2 wheels), 1 person hand held assist, 2 person hand held assist Transfers: Sit to/from Stand, Bed to chair/wheelchair/BSC Sit to Stand: Max  assist, +2 physical assistance, +2 safety/equipment, From elevated surface Stand pivot transfers: Max assist, +2 physical assistance, From elevated surface, +2 safety/equipment         General transfer comment: atempt to stand and stand pivot x2 attempts failed and pt returned to bed    Ambulation/Gait                   Stairs             Wheelchair Mobility     Tilt Bed    Modified Rankin (Stroke Patients Only)       Balance Overall balance assessment: Needs assistance Sitting-balance support: Feet supported Sitting balance-Leahy Scale: Fair Sitting balance - Comments: supervision static sitting   Standing balance support: Bilateral upper extremity supported, Reliant on assistive device for balance, During functional activity Standing balance-Leahy Scale: Zero                              Communication Communication Communication: No apparent difficulties  Cognition Arousal: Alert Behavior During Therapy: WFL for tasks assessed/performed   PT - Cognitive impairments: Safety/Judgement                         Following commands: Impaired Following commands impaired: Follows one step commands with increased time    Cueing Cueing Techniques: Verbal cues  Exercises      General Comments        Pertinent Vitals/Pain Pain Assessment Faces Pain Scale: Hurts little more Pain Location: abdomen Pain Descriptors / Indicators: Cramping Pain  Intervention(s): Monitored during session, Limited activity within patient's tolerance    Home Living                          Prior Function            PT Goals (current goals can now be found in the care plan section) Acute Rehab PT Goals Patient Stated Goal: get better PT Goal Formulation: With patient Time For Goal Achievement: 01/05/25 Potential to Achieve Goals: Good Progress towards PT goals: Progressing toward goals    Frequency    Min 2X/week      PT  Plan      Co-evaluation              AM-PAC PT 6 Clicks Mobility   Outcome Measure  Help needed turning from your back to your side while in a flat bed without using bedrails?: A Little Help needed moving from lying on your back to sitting on the side of a flat bed without using bedrails?: A Lot Help needed moving to and from a bed to a chair (including a wheelchair)?: Total Help needed standing up from a chair using your arms (e.g., wheelchair or bedside chair)?: Total Help needed to walk in hospital room?: Total Help needed climbing 3-5 steps with a railing? : Total 6 Click Score: 9    End of Session Equipment Utilized During Treatment: Oxygen  Activity Tolerance: Patient tolerated treatment well Patient left: in chair;with chair alarm set;with call bell/phone within reach Nurse Communication: Mobility status PT Visit Diagnosis: Unsteadiness on feet (R26.81);Difficulty in walking, not elsewhere classified (R26.2);Other abnormalities of gait and mobility (R26.89);Muscle weakness (generalized) (M62.81)     Time: 1322-1400 PT Time Calculation (min) (ACUTE ONLY): 38 min  Charges:    $Therapeutic Activity: 23-37 mins PT General Charges $$ ACUTE PT VISIT: 1 Visit                     Jacqueline [name] DPT, PT     Jacqueline [name] 12/23/2024, 2:48 PM

## 2024-12-23 NOTE — Progress Notes (Signed)
" °  Received patient in bed to unit.   Informed consent signed and in chart.    TX duration: 3.5hrs     Transported back to floor Hand-off given to patient's nurse.  No c/o   no acute distress noted    Access used: L AVF Access issues: none   Total UF removed: 1.3L Medication(s) given: midodrine  Post HD VS: wnl      Olivia Hurst LPN [facility]   "

## 2024-12-23 NOTE — Progress Notes (Signed)
 " Progress Note   Patient: Jacqueline [name] FMW:[id number] DOB: 08-Apr-1946 DOA: 12/17/2024  DOS: the patient was seen and examined on 12/23/2024   Brief hospital course:  79 y.o. female with medical history significant for A-fib on Eliquis , ESRD on HD (TTS), T2DM, HFpEF (LVEF 60-65%), COPD on chronic 1 L O2, anemia of CKD, HTN, CAD s/p DES to RCA (12/2016), GERD, HLD, morbid obesity who presented to the ED from [facility] on 12/18/2023 due to abdominal pain, constipation, shortness of breath, and cough.   Assessment and Plan:   Acute on chronic hypoxic respiratory failure - Requiring 2 L on presentation, baseline 1 L.  Appears to be resolved at this time.   Acute influenza A with possible superimposed bacterial pneumonia - Worsening cough and shortness of breath on presentation.  No leukocytosis, afebrile.  Chest x-ray noting possible left retrocardiac opacity.  Completed 5 days of ceftriaxone  plus doxycycline .  Tamiflu  every other day.  Prednisone  for 5 days.  Showing improvement.  Appears to be resolved at this time.   Acute metabolic encephalopathy - Acute confusion and lethargy on presentation.  Resolved after treatment with above.   Fecal impaction/constipation/diffuse abdominal pain - Initial presentation with intractable nausea vomiting and diffuse abdominal pain.  Outpatient imaging noted concern for sigmoid volvulus.  CT imaging here noting no volvulus.  Aggressive bowel regiment, underwent smog enema 1/18 with manual disimpaction by RN 1/19.  Bowel movement yesterday 1/20.  Continue current regimen of lactulose , senna, Dulcolax.   Acute exacerbation of HFpEF/volume overload - Elevated proBNP with bilateral pleural effusions on presentation.  Responding well to HD/UF per nephrology.   ESRD on HD - TTS schedule.  Nephrology on board and following closely.     Type II MI - Mildly elevated troponins but flat suggestive of demand ischemia likely from above.   L5 compression  fracture (POA) - Noted on CT 11/2024, recheck CT showed worsening height loss of L5 compression fracture.  Lidocaine  patch, p.o. opioids as needed.   Diabetes mellitus - Insulin  sliding scale on board.   Permanent atrial fibrillation - Currently rate controlled.  Eliquis  2.5 twice daily.   Patulous esophagus/esophageal dysphagia - GI consulted, recommending barium swallow to rule out achalasia prior to discharge.  SLP following.  Protonix  40 mg twice daily.   Chronic hypotension -Continue home Florinef  0.2 mg twice daily, midodrine  10 mg 3 times daily, droxidopa  200 mg 3 times daily   CAD/HLD/CAD - Resume home medication regiment.   Morbid obesity - BMI greater than 40.  Recommending lifestyle modification, dietary changes.  Weight loss strategies.   Goals of care - Awaiting barium swallow.  Patient from [facility].  Anticipate discharge tomorrow.   Subjective: Patient evaluated while in hemodialysis.  Resting comfortably, states she feels well.  Denies any fever, chills, chest pain, nausea, vomiting, abdominal pain.  Physical Exam:  Vitals:   12/23/24 1130 12/23/24 1200 12/23/24 1220 12/23/24 1222  BP: (!) 100/47 (!) 93/47 (!) 77/39 (!) 101/47  Pulse: 68 63 62 72  Resp: 17 20 20 19   Temp:    98.3 F (36.8 C)  TempSrc:    Oral  SpO2: 100% 100% 96% 100%  Weight:      Height:        GENERAL:  Alert, pleasant, no acute distress, obese HEENT:  EOMI CARDIOVASCULAR: Irregularly irregular RESPIRATORY:  Clear to auscultation, no wheezing, rales, or rhonchi GASTROINTESTINAL:  Soft, nontender, nondistended EXTREMITIES:  No LE edema bilaterally NEURO:  No  new focal deficits appreciated SKIN:  No rashes noted PSYCH:  Appropriate mood and affect, anxious   Data Reviewed:  Imaging Studies: DG Chest Port 1 View Result Date: 12/20/2024 CLINICAL DATA:  High risk for aspiration. EXAM: PORTABLE CHEST 1 VIEW COMPARISON:  12/17/2024 and CT chest 06/21/2024. FINDINGS: Trachea  is midline. Heart is enlarged. Left perihilar consolidation with streaky opacification in the lower lobes. Small loculated left pleural effusion. IMPRESSION: 1. Left perihilar consolidation may be due to pneumonia. Difficult to exclude a mass. Consider CT chest with contrast in further evaluation, as clinically indicated. 2. Streaky bibasilar atelectasis. 3. Small loculated left pleural effusion. Electronically Signed   By: Newell Eke M.D.   On: 12/20/2024 10:31   CT HEAD WO CONTRAST ( ) Result Date: 12/19/2024 EXAM: CT HEAD WITHOUT CONTRAST 12/19/2024 03:12:00 PM TECHNIQUE: CT of the head was performed without the administration of intravenous contrast. Automated exposure control, iterative reconstruction, and/or weight based adjustment of the mA/kV was utilized to reduce the radiation dose to as low as reasonably achievable. COMPARISON: 06/21/2024 CLINICAL HISTORY: Altered mental status, nontraumatic FINDINGS: BRAIN AND VENTRICLES: Mild generalized atrophy and white matter changes are stable, potentially within normal limits for age. Atherosclerotic calcifications within the cavernous internal carotid and vertebral arteries. No acute hemorrhage. No evidence of acute infarct. No hydrocephalus. No extra-axial collection. No mass effect or midline shift. ORBITS: Left lens replacement. SINUSES: Bilateral mastoid effusions. Chronic wall thickening is present in the right maxillary sinus. No active disease is present. SOFT TISSUES AND SKULL: No acute soft tissue abnormality. No skull fracture. IMPRESSION: 1. No acute intracranial abnormality or significant interval change . Electronically signed by: Lonni Necessary MD 12/19/2024 06:35 PM EST RP Workstation: [id number]   CT ABDOMEN PELVIS WO CONTRAST Result Date: 12/17/2024 CLINICAL DATA:  Constipation and vomiting EXAM: CT ABDOMEN AND PELVIS WITHOUT CONTRAST TECHNIQUE: Multidetector CT imaging of the abdomen and pelvis was performed following the  standard protocol without IV contrast. RADIATION DOSE REDUCTION: This exam was performed according to the departmental dose-optimization program which includes automated exposure control, adjustment of the mA and/or kV according to patient size and/or use of iterative reconstruction technique. COMPARISON:  Same-day abdominal radiograph, CT abdomen and pelvis dated 11/25/2024 FINDINGS: Lower chest: No focal consolidation or pulmonary nodule in the lung bases. Trace bilateral pleural effusions. Multichamber cardiomegaly. Coronary artery calcifications. Hepatobiliary: No focal hepatic lesions. No intra or extrahepatic biliary ductal dilation. Cholelithiasis. Pancreas: No focal lesions or main ductal dilation. Spleen: Normal in size without focal abnormality. Adrenals/Urinary Tract: No adrenal nodules. Atrophic kidneys containing multifocal bilateral cysts, largest arising exophytically from the left interpolar kidney measuring 9.8 cm (4:42). No suspicious renal mass, calculi or hydronephrosis. Decompressed urinary bladder. Stomach/Bowel: Partially imaged mildly patulous lower esophagus containing a few nodular densities measuring up to 8 mm (4:17). Normal appearance of the stomach. No evidence of bowel wall thickening, distention, or inflammatory changes. Moderate volume stool within the rectum. Sigmoid colon is located within the right lower quadrant. Small volume stool within the upstream colon. Appy normal appendix. Appendix is not discretely seen. Vascular/Lymphatic: Aortic atherosclerosis. No enlarged abdominal or pelvic lymph nodes. Reproductive: No adnexal masses. Other: No free fluid, fluid collection, or free air. Musculoskeletal: Increased height loss of L5 compression fracture. Unchanged subtle rounded sclerotic focus within the left iliac (4:70). Multilevel degenerative changes of the partially imaged thoracic and lumbar spine. Left lateral flank subcutaneous soft tissue edema. IMPRESSION: 1. Moderate  volume stool within the rectum may represent fecal impaction. Small  volume stool within the upstream colon. No abnormal bowel dilation. 2. Partially imaged mildly patulous lower esophagus containing a few nodular densities measuring up to 8 mm. Consider correlation with endoscopy. 3. Increased height loss of L5 compression fracture. 4. Trace bilateral pleural effusions. 5.  Aortic Atherosclerosis (ICD10-I70.0). Electronically Signed   By: Limin  Xu M.D.   On: 12/17/2024 16:29   DG Abdomen 1 View Result Date: 12/17/2024 CLINICAL DATA:  Constipation, vomiting EXAM: ABDOMEN - 1 VIEW COMPARISON:  November 25, 2024 FINDINGS: The bowel gas pattern is normal. Mild amount of stool is seen in the colon and rectum. No radio-opaque calculi or other significant radiographic abnormality are seen. IMPRESSION: No abnormal bowel dilatation. Mild stool burden. Electronically Signed   By: Lynwood Landy Raddle M.D.   On: 12/17/2024 12:46   DG Chest 2 View Result Date: 12/17/2024 CLINICAL DATA:  Cough EXAM: CHEST - 2 VIEW COMPARISON:  November 25, 2024 FINDINGS: Stable cardiomegaly. Possible left retrocardiac opacity concerning for atelectasis or infiltrate and possible effusion. Bony thorax is unremarkable. IMPRESSION: Possible left retrocardiac opacity as described above. Electronically Signed   By: Lynwood Landy Raddle M.D.   On: 12/17/2024 12:45   CT ABDOMEN PELVIS W CONTRAST Result Date: 11/25/2024 EXAM: CT ABDOMEN AND PELVIS WITH CONTRAST 11/25/2024 02:15:02 PM TECHNIQUE: CT of the abdomen and pelvis was performed with the administration of 80 mL iohexol  (OMNIPAQUE ) 300 MG/ML solution. Multiplanar reformatted images are provided for review. Automated exposure control, iterative reconstruction, and/or weight-based adjustment of the mA/kV was utilized to reduce the radiation dose to as low as reasonably achievable. COMPARISON: None available. CLINICAL HISTORY: Bowel obstruction suspected. FINDINGS: LOWER CHEST: No acute  abnormality. LIVER: The liver is unremarkable. GALLBLADDER AND BILE DUCTS: Gallbladder is unremarkable. No biliary ductal dilatation. SPLEEN: No acute abnormality. PANCREAS: Diffusely atrophic pancreas. No focal lesion. Otherwise normal pancreatic contour. No surrounding inflammatory changes. No main pancreatic ductal dilatation. ADRENAL GLANDS: No acute abnormality. KIDNEYS, URETERS AND BLADDER: Atrophic bilateral kidneys. Bilateral renal cortical scarring. Bilateral low-density lesions that likely represent simple renal cysts. Per consensus, no follow-up is needed for simple Bosniak type 1 and 2 renal cysts, unless the patient has a malignancy history or risk factors. Subcentimeter hypodensities too small to characterize - no further follow-up indicated. No stones in the kidneys or ureters. No hydronephrosis. No perinephric or periureteral stranding. Urinary bladder is unremarkable. GI AND BOWEL: Stomach demonstrates no acute abnormality. No small or large bowel thickening or dilatation. The appendix is unremarkable. Colonic diverticulosis. There is no bowel obstruction. PERITONEUM AND RETROPERITONEUM: No ascites. No free air. VASCULATURE: Aorta is normal in caliber. LYMPH NODES: No lymphadenopathy. REPRODUCTIVE ORGANS: Status post hysterectomy. No adnexal mass. BONES AND SOFT TISSUES: Diffusely decreased bone density. Aged indeterminate, possibly acute, superior endplate and anterior wall fracture of the L5 vertebral body. Associated underlying at least 25% vertebral body height loss. No definite suspicious lytic or blastic osseous lesion. Moderate atherosclerotic plaque. No focal soft tissue abnormality. IMPRESSION: 1. Aged indeterminate, possibly acute, superior endplate and anterior wall fracture of the L5 vertebral body with at least 25% height loss. Orally with point tenderness to palpation to evaluate for an acute component. 2. Colonic diverticulosis with no acute diverticulitis. 3. Bilateral atrophic  kidneys. 4. Status post hysterectomy. 5. Electronically signed by: Morgane Naveau MD 11/25/2024 02:30 PM EST RP Workstation: [id number]   DG Chest 1 View Result Date: 11/25/2024 CLINICAL DATA:  Constipation with nausea and generalized abdominal pain. EXAM: CHEST  1 VIEW COMPARISON:  July 20, 2023 FINDINGS: The cardiac silhouette is enlarged and unchanged in size. There is marked severity calcification of the aortic arch. Low lung volumes are noted without evidence of acute infiltrate, pleural effusion or pneumothorax. A 1.4 cm well-defined nodular opacity is seen overlying the soft tissues of the right breast. This may represent an overlying nipple shadow. Multilevel degenerative changes are seen throughout the thoracic spine. IMPRESSION: Low lung volumes without evidence of acute or active cardiopulmonary disease. Electronically Signed   By: Suzen Dials M.D.   On: 11/25/2024 12:27   DG Abdomen 1 View Result Date: 11/25/2024 CLINICAL DATA:  Constipation. EXAM: ABDOMEN - 1 VIEW COMPARISON:  03/03/2022 FINDINGS: Diffuse gaseous distention of small bowel identified without overtly obstructive appearance. There is some gas scattered along colonic loops with stool visible in the rectum. Bones appear demineralized. IMPRESSION: Diffuse gaseous distention of small bowel without overtly obstructive appearance. Imaging features may be related to ileus. Colon is nondistended with some minimal stool visible in the rectum. Electronically Signed   By: Camellia Candle M.D.   On: 11/25/2024 12:24    There are no new results to review at this time.  Previous records (including but not limited to H&P, progress notes, nursing notes, TOC management) were reviewed in assessment of this patient.  Labs: CBC: Recent Labs  Lab 12/17/24 1140 12/17/24 2105 12/18/24 0111 12/19/24 0852 12/20/24 0934 12/21/24 1320 12/23/24 0900  WBC 6.1   < > 7.4 8.7 8.6 9.9 9.7  NEUTROABS 5.6  --   --   --   --   --  8.4*  HGB  10.3*   < > 10.8* 10.9* 10.8* 10.2* 9.5*  HCT 33.2*   < > 34.9* 35.2* 34.3* 31.8* 30.1*  MCV 103.4*   < > 101.5* 100.9* 100.0 99.4 100.7*  PLT 108*   < > 122* 113* 123* 122* 122*   < > = values in this interval not displayed.   Basic Metabolic Panel: Recent Labs  Lab 12/18/24 0111 12/19/24 0852 12/20/24 0934 12/21/24 0500 12/23/24 0900  NA 137 136 137 138 136  K 4.4 4.0 4.3 3.8 3.9  CL 91* 93* 93* 95* 96*  CO2 24 27 28 23 27   GLUCOSE 207* 150* 106* 193* 225*  BUN 41* 35* 46* 60* 39*  CREATININE 6.69* 5.31* 6.34* 7.41* 5.63*  CALCIUM  7.9* 7.9* 7.7* 7.6* 8.0*  MG  --   --   --  2.4  --   PHOS  --  2.4* 2.8 2.5 2.1*   Liver Function Tests: Recent Labs  Lab 12/17/24 1140 12/19/24 0852 12/20/24 0934 12/21/24 0500 12/23/24 0900  AST 33  --   --   --   --   ALT 13  --   --   --   --   ALKPHOS 181*  --   --   --   --   BILITOT 0.3  --   --   --   --   PROT 6.6  --   --   --   --   ALBUMIN  3.5 3.7 3.4* 3.3* 3.3*   CBG: Recent Labs  Lab 12/21/24 2106 12/22/24 0751 12/22/24 1232 12/22/24 1709 12/22/24 2118  GLUCAP 213* 102* 167* 289* 235*    Scheduled Meds:  allopurinol   100 mg Oral Daily   apixaban   2.5 mg Oral BID   bisacodyl   10 mg Rectal Once   calcium  acetate  667 mg Oral TID WC   droxidopa   200 mg Oral TID with meals   ezetimibe   10 mg Oral Daily   feeding supplement (NEPRO CARB STEADY)  237 mL Oral QHS   fludrocortisone   0.2 mg Oral BID   insulin  aspart  0-20 Units Subcutaneous TID WC   lactulose   20 g Oral TID   lidocaine   1 patch Transdermal Q24H   lidocaine   1 Application Topical BID   midodrine   10 mg Oral TID PC   multivitamin  1 tablet Oral QHS   pantoprazole   40 mg Oral BID   polyethylene glycol-electrolytes  2,000 mL Oral Once   rosuvastatin   10 mg Oral Daily   senna  1 tablet Oral BID   umeclidinium-vilanterol  1 puff Inhalation Daily   Continuous Infusions: PRN Meds:.acetaminophen  **OR** [DISCONTINUED] acetaminophen , bisacodyl ,  guaiFENesin -dextromethorphan , ipratropium-albuterol , naloxone , ondansetron  **OR** ondansetron  (ZOFRAN ) IV, oxyCODONE   Family Communication: None at bedside  Disposition: Status is: Inpatient Remains inpatient appropriate because: Hemodialysis     Time spent: 36 minutes  Length of inpatient stay: 5 days  Author: Carliss LELON Canales, DO 12/23/2024 1:10 PM  For on call review [email or web address].   "

## 2024-12-24 DIAGNOSIS — N186 End stage renal disease: Secondary | ICD-10-CM | POA: Diagnosis not present

## 2024-12-24 DIAGNOSIS — I9589 Other hypotension: Secondary | ICD-10-CM | POA: Diagnosis not present

## 2024-12-24 DIAGNOSIS — K224 Dyskinesia of esophagus: Secondary | ICD-10-CM | POA: Diagnosis not present

## 2024-12-24 DIAGNOSIS — J101 Influenza due to other identified influenza virus with other respiratory manifestations: Secondary | ICD-10-CM | POA: Diagnosis not present

## 2024-12-24 DIAGNOSIS — E1122 Type 2 diabetes mellitus with diabetic chronic kidney disease: Secondary | ICD-10-CM | POA: Diagnosis not present

## 2024-12-24 DIAGNOSIS — K222 Esophageal obstruction: Secondary | ICD-10-CM | POA: Diagnosis not present

## 2024-12-24 DIAGNOSIS — K219 Gastro-esophageal reflux disease without esophagitis: Secondary | ICD-10-CM | POA: Diagnosis not present

## 2024-12-24 LAB — BASIC METABOLIC PANEL WITH GFR
Anion gap: 11 (ref 5–15)
BUN: 21 mg/dL (ref 8–23)
CO2: 28 mmol/L (ref 22–32)
Calcium: 8.2 mg/dL — ABNORMAL LOW (ref 8.9–10.3)
Chloride: 99 mmol/L (ref 98–111)
Creatinine, Ser: 3.75 mg/dL — ABNORMAL HIGH (ref 0.44–1.00)
GFR, Estimated: 12 mL/min — ABNORMAL LOW
Glucose, Bld: 180 mg/dL — ABNORMAL HIGH (ref 70–99)
Potassium: 3.3 mmol/L — ABNORMAL LOW (ref 3.5–5.1)
Sodium: 138 mmol/L (ref 135–145)

## 2024-12-24 LAB — GLUCOSE, CAPILLARY
Glucose-Capillary: 128 mg/dL — ABNORMAL HIGH (ref 70–99)
Glucose-Capillary: 126 mg/dL — ABNORMAL HIGH (ref 70–99)

## 2024-12-24 LAB — CBC
HCT: 32.1 % — ABNORMAL LOW (ref 36.0–46.0)
Hemoglobin: 10 g/dL — ABNORMAL LOW (ref 12.0–15.0)
MCH: 31.5 pg (ref 26.0–34.0)
MCHC: 31.2 g/dL (ref 30.0–36.0)
MCV: 101.3 fL — ABNORMAL HIGH (ref 80.0–100.0)
Platelets: 132 K/uL — ABNORMAL LOW (ref 150–400)
RBC: 3.17 MIL/uL — ABNORMAL LOW (ref 3.87–5.11)
RDW: 18.5 % — ABNORMAL HIGH (ref 11.5–15.5)
WBC: 12.3 K/uL — ABNORMAL HIGH (ref 4.0–10.5)
nRBC: 0 % (ref 0.0–0.2)

## 2024-12-24 LAB — VITAMIN A: Vitamin A (Retinoic Acid): 84.6 ug/dL — ABNORMAL HIGH (ref 22.0–69.5)

## 2024-12-24 LAB — VITAMIN C: Vitamin C: <0.1 mg/dL — ABNORMAL LOW (ref 0.4–2.0)

## 2024-12-24 MED ORDER — CALCIUM ACETATE (PHOS BINDER) 667 MG PO CAPS: 667.0000 mg | ORAL_CAPSULE | Freq: Three times a day (TID) | ORAL | Status: AC

## 2024-12-24 MED ORDER — MIDODRINE HCL 5 MG PO TABS
5.0000 mg | ORAL_TABLET | Freq: Once | ORAL | Status: AC
  Administered 2024-12-24: 5 mg via ORAL
  Filled 2024-12-24: qty 1

## 2024-12-24 MED ORDER — PANTOPRAZOLE SODIUM 40 MG PO TBEC: 40.0000 mg | DELAYED_RELEASE_TABLET | Freq: Two times a day (BID) | ORAL | Status: AC

## 2024-12-24 MED ORDER — GUAIFENESIN ER 600 MG PO TB12
600.0000 mg | ORAL_TABLET | Freq: Two times a day (BID) | ORAL | Status: DC
  Administered 2024-12-24: 600 mg via ORAL
  Filled 2024-12-24: qty 1

## 2024-12-24 NOTE — Discharge Summary (Signed)
 " Physician Discharge Summary   Patient: Jacqueline [name] MRN: [id number] DOB: 08/11/1946  Admit date:     12/17/2024  Discharge date: 12/24/24  Discharge Physician: Carliss LELON Canales   PCP: Austin Candyce Stall, MD   Recommendations at discharge:    Pt to be discharged to [facility].   If you experience worsening fever, chills, chest pain, shortness of breath, or other concerning symptoms, please call your PCP or go to the emergency department immediately.  Discharge Diagnoses: Principal Problem:   Pneumonia Active Problems:   ESRD (end stage renal disease) (HCC)   Chronic hypotension   Permanent atrial fibrillation (HCC)   Type 2 diabetes mellitus with end-stage renal disease (HCC)   HLD (hyperlipidemia)   Constipation   Morbid obesity (HCC)   COPD with acute exacerbation (HCC)   Chronic heart failure with preserved ejection fraction (HFpEF) (HCC)   Anemia of chronic kidney failure   Chronic hypoxic respiratory failure (HCC)   GERD (gastroesophageal reflux disease)   Influenza A   Compression fracture of L5 vertebra (HCC)  Resolved Problems:   * No resolved hospital problems. *   Hospital Course:  79 y.o. female with medical history significant for A-fib on Eliquis , ESRD on HD (TTS), T2DM, HFpEF (LVEF 60-65%), COPD on chronic 1 L O2, anemia of CKD, HTN, CAD s/p DES to RCA (12/2016), GERD, HLD, morbid obesity who presented to the ED from [facility] on 12/18/2023 due to abdominal pain, constipation, shortness of breath, and cough.   Assessment and Plan:   Acute on chronic hypoxic respiratory failure - Requiring 2 L on presentation, baseline 1 L.  Appears to be resolved at this time.   Acute influenza A with possible superimposed bacterial pneumonia - Worsening cough and shortness of breath on presentation.  No leukocytosis, afebrile.  Chest x-ray noting possible left retrocardiac opacity.  Completed 5 days of ceftriaxone  plus doxycycline .  Completed Tamiflu .   Completed prednisone  for 5 days.  Showing improvement.  Appears to be resolved at this time.  No need for droplet precautions.   Acute metabolic encephalopathy - Acute confusion and lethargy on presentation.  Resolved after treatment with above.   Fecal impaction/constipation/diffuse abdominal pain - Initial presentation with intractable nausea vomiting and diffuse abdominal pain.  Outpatient imaging noted concern for sigmoid volvulus.  CT imaging here noting no volvulus.  Aggressive bowel regiment, underwent smog enema 1/18 with manual disimpaction by RN 1/19.  Bowel movement yesterday 1/20.  Resume previous bowel regimen as needed.  Acute exacerbation of HFpEF/volume overload - Elevated proBNP with bilateral pleural effusions on presentation.  Responding well to HD/UF per nephrology.   ESRD on HD - TTS schedule.  Nephrology on board and following closely.  Per nephrology, HD clinic should be open tomorrow (Saturday) for regularly scheduled dialysis.   Type II MI - Mildly elevated troponins but flat suggestive of demand ischemia likely from above.   L5 compression fracture (POA) - Noted on CT 11/2024, recheck CT showed worsening height loss of L5 compression fracture.  Lidocaine  patch, p.o. opioids as needed.   Diabetes mellitus - Insulin  sliding scale on board.   Permanent atrial fibrillation - Currently rate controlled.  Eliquis  2.5 twice daily.   Patulous esophagus/esophageal dysphagia - GI consulted, recommending barium swallow to rule out achalasia prior to discharge.  Barium swallow noting esophageal dysmotility and distal esophageal narrowing with concern for stricture.  Recommend increasing Protonix  40 mg twice daily.  Will provide referral to GI.  Patient  may benefit from GI follow-up in the outpatient setting and possible endoscopy evaluation.   Chronic hypotension -Continue home Florinef  0.2 mg twice daily, midodrine  10 mg 3 times daily, droxidopa  200 mg 3 times daily    CAD/HLD/CAD - Resume home medication regiment.   Morbid obesity - BMI greater than 40.  Recommending lifestyle modification, dietary changes.  Weight loss strategies.    Consultants: Nephrology Procedures performed: None Disposition: Skilled nursing facility Diet recommendation:  Renal diet  DISCHARGE MEDICATION: Allergies as of 12/24/2024       Reactions   Sulfa Antibiotics Other (See Comments)   Sulfa Drugs Cross Reactors Other (See Comments)   Doesn't remember         Medication List     STOP taking these medications    fexofenadine 180 MG tablet Commonly known as: ALLEGRA   fluticasone 50 MCG/ACT nasal spray Commonly known as: FLONASE   oseltamivir  30 MG capsule Commonly known as: TAMIFLU    promethazine -dextromethorphan  6.25-15 MG/5ML syrup Commonly known as: PROMETHAZINE -DM   umeclidinium-vilanterol 62.5-25 MCG/ACT Aepb Commonly known as: ANORO ELLIPTA        TAKE these medications    acetaminophen  650 MG CR tablet Commonly known as: TYLENOL  Take 650 mg by mouth every 8 (eight) hours. (0600, 1400 & 2200)   albuterol  (2.5 MG/3ML) 0.083% nebulizer solution Commonly known as: PROVENTIL  Take 2.5 mg by nebulization every 6 (six) hours as needed for wheezing or shortness of breath.   allopurinol  100 MG tablet Commonly known as: ZYLOPRIM  Take 100 mg by mouth daily.   aluminum -magnesium  hydroxide-simethicone  200-200-20 MG/5ML Susp Commonly known as: MAALOX Take 30 mLs by mouth every 4 (four) hours as needed.   apixaban  2.5 MG Tabs tablet Commonly known as: ELIQUIS  Take 1 tablet (2.5 mg total) by mouth 2 (two) times daily.   Aspercreme Lidocaine  4 % Crea Generic drug: Lidocaine  HCl Apply 1 Application topically in the morning and at bedtime. Applied to bilateral knees   benzonatate  100 MG capsule Commonly known as: TESSALON  Take 100 mg by mouth 3 (three) times daily as needed for cough.   bisacodyl  10 MG suppository Commonly known as:  DULCOLAX Place 10 mg rectally daily as needed for moderate constipation.   calcium  acetate 667 MG capsule Commonly known as: PHOSLO  Take 1 capsule (667 mg total) by mouth 3 (three) times daily with meals.   cinacalcet  30 MG tablet Commonly known as: SENSIPAR  Take 30 mg by mouth daily with supper. (1800)   clotrimazole-betamethasone cream Commonly known as: LOTRISONE Apply 1 Application topically See admin instructions. Apply as needed to affected area of ear for itching/irritation.   Droxidopa  200 MG Caps Take 200 mg by mouth with breakfast, with lunch, and with evening meal.   ezetimibe  10 MG tablet Commonly known as: ZETIA  Take 1 tablet (10 mg total) by mouth daily.   ferric citrate  1 GM 210 MG(Fe) tablet Commonly known as: AURYXIA  Take 420 mg by mouth 3 (three) times daily with meals.   fludrocortisone  0.1 MG tablet Commonly known as: FLORINEF  Take 2 tablets (0.2 mg total) by mouth 2 (two) times daily.   guaiFENesin -dextromethorphan  100-10 MG/5ML syrup Commonly known as: ROBITUSSIN DM Take 15 mLs by mouth every 4 (four) hours as needed for cough. What changed: Another medication with the same name was removed. Continue taking this medication, and follow the directions you see here.   HYDROcodone -acetaminophen  5-325 MG tablet Commonly known as: NORCO/VICODIN Take 1-2 tablets by mouth every 6 (six) hours as needed for moderate  pain.   insulin  aspart 100 UNIT/ML injection Commonly known as: novoLOG  Inject 0-20 Units into the skin 3 (three) times daily with meals. For CBG 70-120: 0 units For CBG 121-150: 3 units For CBG 151-200: 4 units For CBG 201-250: 7 units For CBG 251-300: 11 units For CBG 301-350: 15 units For CBG 351-400: 20 units For CBG > 400: 25 units and call your doctor What changed: how much to take   ipratropium-albuterol  0.5-2.5 (3) MG/3ML Soln Commonly known as: DUONEB Take 3 mLs by nebulization 2 (two) times daily.   lactulose  10 GM/15ML  solution Commonly known as: CHRONULAC  Take 20 g by mouth 2 (two) times daily.   lidocaine  5 % Commonly known as: LIDODERM  Place 1 patch onto the skin daily.   magnesium  hydroxide 400 MG/5ML suspension Commonly known as: MILK OF MAGNESIA Take 30 mLs by mouth daily as needed (if no BM in 3 days.).   midodrine  10 MG tablet Commonly known as: PROAMATINE  Take 10 mg by mouth 3 (three) times daily.   MOISTURE BARRIER EX Apply 1 Application topically as directed. Apply barrier cream to buttocks every shift   multivitamin with minerals Tabs tablet Take 1 tablet by mouth at bedtime.   naloxone  4 MG/0.1ML Liqd nasal spray kit Commonly known as: NARCAN  Place 1 spray into the nose 3 (three) times daily as needed (signs and symptoms of opioid overdose).   nitroGLYCERIN  0.4 MG SL tablet Commonly known as: NITROSTAT  Place 1 tablet (0.4 mg total) under the tongue every 5 (five) minutes as needed for chest pain.   ondansetron  4 MG disintegrating tablet Commonly known as: ZOFRAN -ODT Take 4 mg by mouth every 8 (eight) hours as needed for nausea or vomiting.   OXYGEN  Inhale 2 L/min into the lungs as needed (shortness of breath or decreased O2).   pantoprazole  40 MG tablet Commonly known as: PROTONIX  Take 1 tablet (40 mg total) by mouth daily.   polyethylene glycol 17 g packet Commonly known as: MIRALAX  / GLYCOLAX  Take 17 g by mouth daily.   rosuvastatin  10 MG tablet Commonly known as: CRESTOR  Take 1 tablet (10 mg total) by mouth daily.   senna-docusate 8.6-50 MG tablet Commonly known as: Senokot-S Take 2 tablets by mouth at bedtime.   simethicone  80 MG chewable tablet Commonly known as: MYLICON Chew 80 mg by mouth daily as needed for flatulence.   sodium phosphate Enem Place 1 enema rectally daily as needed for severe constipation (if no BM results in 12 hours from dulcolax suppository).   sorbitol  70 % solution Take 30 mLs by mouth daily as needed (constipation.).          Discharge Exam: Filed Weights   12/21/24 1256 12/21/24 1633 12/23/24 0829  Weight: 94.6 kg 93.6 kg 94.5 kg    GENERAL:  Alert, pleasant, no acute distress, obese HEENT:  EOMI CARDIOVASCULAR: Irregularly irregular RESPIRATORY:  Clear to auscultation, no wheezing, rales, or rhonchi GASTROINTESTINAL:  Soft, nontender, nondistended EXTREMITIES:  No LE edema bilaterally NEURO:  No new focal deficits appreciated SKIN:  No rashes noted PSYCH:  Appropriate mood and affect, anxious   Condition at discharge: improving  The results of significant diagnostics from this hospitalization (including imaging, microbiology, ancillary and laboratory) are listed below for reference.   Imaging Studies: DG ESOPHAGUS W SINGLE CM (SOL OR THIN BA) Result Date: 12/23/2024 CLINICAL DATA:  79 year old female with a history of GERD, on medication, currently admitted with acute on chronic hypoxic respiratory failure secondary to flu with  possible superimposed bacterial pneumonia. CT AP from 12/17/2024 notable for mildly patulous lower esophagus with a few nodular densities. GI recommending barium swallow to loculation of prior to endoscopy. Patient reports difficulty swallowing both solids and liquids for the past several months. States that she frequently regurgitates liquids causing them to come out of her nose and that solids feel as if they get stuck in her lower chest. EXAM: ESOPHAGUS/BARIUM SWALLOW/TABLET STUDY TECHNIQUE: Single contrast examination was performed using thin liquid barium. This exam was performed by [facility] PA-C, and was supervised and interpreted by Dr. KANDICE Moan. FLUOROSCOPY: Radiation Exposure Index (as provided by the fluoroscopic device): 71.5 mGy Kerma COMPARISON:  CT ABDOMEN PELVIS WO CONTRAST-12/17/2024 FINDINGS: Swallowing: Appears normal. No vestibular penetration or aspiration seen. Pharynx: Unremarkable. Esophagus: Limited evaluation due to single contrast exam, but no  gross mucosal abnormality. Persistent narrowing at the level of the GE junction concerning for high grade stricture. Esophageal motility: Esophageal dysmotility with intermittent proximal escape and tertiary contractions leading to significantly delayed passage of contrast from the esophagus into the stomach Hiatal Hernia: None visualized on exam. Ingested 13mm barium tablet: Tablet became lodged in the mid to distal esophagus and would not pass with additional water thin barium intake. This did not occur at the level of the GE junction limiting evaluation of the stricture. Other: Extremely limited exam due to patient mobility. Entirety of exam was performed at approximately 15 degree table tilt. IMPRESSION: Significant esophageal dysmotility. Persistent narrowing at the level of the GE junction concerning for high grade stricture. Electronically Signed   By: Marcey Moan M.D.   On: 12/23/2024 16:38   DG Chest Port 1 View Result Date: 12/20/2024 CLINICAL DATA:  High risk for aspiration. EXAM: PORTABLE CHEST 1 VIEW COMPARISON:  12/17/2024 and CT chest 06/21/2024. FINDINGS: Trachea is midline. Heart is enlarged. Left perihilar consolidation with streaky opacification in the lower lobes. Small loculated left pleural effusion. IMPRESSION: 1. Left perihilar consolidation may be due to pneumonia. Difficult to exclude a mass. Consider CT chest with contrast in further evaluation, as clinically indicated. 2. Streaky bibasilar atelectasis. 3. Small loculated left pleural effusion. Electronically Signed   By: Newell Eke M.D.   On: 12/20/2024 10:31   CT HEAD WO CONTRAST ( ) Result Date: 12/19/2024 EXAM: CT HEAD WITHOUT CONTRAST 12/19/2024 03:12:00 PM TECHNIQUE: CT of the head was performed without the administration of intravenous contrast. Automated exposure control, iterative reconstruction, and/or weight based adjustment of the mA/kV was utilized to reduce the radiation dose to as low as reasonably  achievable. COMPARISON: 06/21/2024 CLINICAL HISTORY: Altered mental status, nontraumatic FINDINGS: BRAIN AND VENTRICLES: Mild generalized atrophy and white matter changes are stable, potentially within normal limits for age. Atherosclerotic calcifications within the cavernous internal carotid and vertebral arteries. No acute hemorrhage. No evidence of acute infarct. No hydrocephalus. No extra-axial collection. No mass effect or midline shift. ORBITS: Left lens replacement. SINUSES: Bilateral mastoid effusions. Chronic wall thickening is present in the right maxillary sinus. No active disease is present. SOFT TISSUES AND SKULL: No acute soft tissue abnormality. No skull fracture. IMPRESSION: 1. No acute intracranial abnormality or significant interval change . Electronically signed by: Lonni Necessary MD 12/19/2024 06:35 PM EST RP Workstation: [id number]   CT ABDOMEN PELVIS WO CONTRAST Result Date: 12/17/2024 CLINICAL DATA:  Constipation and vomiting EXAM: CT ABDOMEN AND PELVIS WITHOUT CONTRAST TECHNIQUE: Multidetector CT imaging of the abdomen and pelvis was performed following the standard protocol without IV contrast. RADIATION DOSE REDUCTION: This exam was  performed according to the departmental dose-optimization program which includes automated exposure control, adjustment of the mA and/or kV according to patient size and/or use of iterative reconstruction technique. COMPARISON:  Same-day abdominal radiograph, CT abdomen and pelvis dated 11/25/2024 FINDINGS: Lower chest: No focal consolidation or pulmonary nodule in the lung bases. Trace bilateral pleural effusions. Multichamber cardiomegaly. Coronary artery calcifications. Hepatobiliary: No focal hepatic lesions. No intra or extrahepatic biliary ductal dilation. Cholelithiasis. Pancreas: No focal lesions or main ductal dilation. Spleen: Normal in size without focal abnormality. Adrenals/Urinary Tract: No adrenal nodules. Atrophic kidneys containing  multifocal bilateral cysts, largest arising exophytically from the left interpolar kidney measuring 9.8 cm (4:42). No suspicious renal mass, calculi or hydronephrosis. Decompressed urinary bladder. Stomach/Bowel: Partially imaged mildly patulous lower esophagus containing a few nodular densities measuring up to 8 mm (4:17). Normal appearance of the stomach. No evidence of bowel wall thickening, distention, or inflammatory changes. Moderate volume stool within the rectum. Sigmoid colon is located within the right lower quadrant. Small volume stool within the upstream colon. Appy normal appendix. Appendix is not discretely seen. Vascular/Lymphatic: Aortic atherosclerosis. No enlarged abdominal or pelvic lymph nodes. Reproductive: No adnexal masses. Other: No free fluid, fluid collection, or free air. Musculoskeletal: Increased height loss of L5 compression fracture. Unchanged subtle rounded sclerotic focus within the left iliac (4:70). Multilevel degenerative changes of the partially imaged thoracic and lumbar spine. Left lateral flank subcutaneous soft tissue edema. IMPRESSION: 1. Moderate volume stool within the rectum may represent fecal impaction. Small volume stool within the upstream colon. No abnormal bowel dilation. 2. Partially imaged mildly patulous lower esophagus containing a few nodular densities measuring up to 8 mm. Consider correlation with endoscopy. 3. Increased height loss of L5 compression fracture. 4. Trace bilateral pleural effusions. 5.  Aortic Atherosclerosis (ICD10-I70.0). Electronically Signed   By: Limin  Xu M.D.   On: 12/17/2024 16:29   DG Abdomen 1 View Result Date: 12/17/2024 CLINICAL DATA:  Constipation, vomiting EXAM: ABDOMEN - 1 VIEW COMPARISON:  November 25, 2024 FINDINGS: The bowel gas pattern is normal. Mild amount of stool is seen in the colon and rectum. No radio-opaque calculi or other significant radiographic abnormality are seen. IMPRESSION: No abnormal bowel dilatation.  Mild stool burden. Electronically Signed   By: Lynwood Landy Raddle M.D.   On: 12/17/2024 12:46   DG Chest 2 View Result Date: 12/17/2024 CLINICAL DATA:  Cough EXAM: CHEST - 2 VIEW COMPARISON:  November 25, 2024 FINDINGS: Stable cardiomegaly. Possible left retrocardiac opacity concerning for atelectasis or infiltrate and possible effusion. Bony thorax is unremarkable. IMPRESSION: Possible left retrocardiac opacity as described above. Electronically Signed   By: Lynwood Landy Raddle M.D.   On: 12/17/2024 12:45   CT ABDOMEN PELVIS W CONTRAST Result Date: 11/25/2024 EXAM: CT ABDOMEN AND PELVIS WITH CONTRAST 11/25/2024 02:15:02 PM TECHNIQUE: CT of the abdomen and pelvis was performed with the administration of 80 mL iohexol  (OMNIPAQUE ) 300 MG/ML solution. Multiplanar reformatted images are provided for review. Automated exposure control, iterative reconstruction, and/or weight-based adjustment of the mA/kV was utilized to reduce the radiation dose to as low as reasonably achievable. COMPARISON: None available. CLINICAL HISTORY: Bowel obstruction suspected. FINDINGS: LOWER CHEST: No acute abnormality. LIVER: The liver is unremarkable. GALLBLADDER AND BILE DUCTS: Gallbladder is unremarkable. No biliary ductal dilatation. SPLEEN: No acute abnormality. PANCREAS: Diffusely atrophic pancreas. No focal lesion. Otherwise normal pancreatic contour. No surrounding inflammatory changes. No main pancreatic ductal dilatation. ADRENAL GLANDS: No acute abnormality. KIDNEYS, URETERS AND BLADDER: Atrophic bilateral kidneys. Bilateral  renal cortical scarring. Bilateral low-density lesions that likely represent simple renal cysts. Per consensus, no follow-up is needed for simple Bosniak type 1 and 2 renal cysts, unless the patient has a malignancy history or risk factors. Subcentimeter hypodensities too small to characterize - no further follow-up indicated. No stones in the kidneys or ureters. No hydronephrosis. No perinephric or  periureteral stranding. Urinary bladder is unremarkable. GI AND BOWEL: Stomach demonstrates no acute abnormality. No small or large bowel thickening or dilatation. The appendix is unremarkable. Colonic diverticulosis. There is no bowel obstruction. PERITONEUM AND RETROPERITONEUM: No ascites. No free air. VASCULATURE: Aorta is normal in caliber. LYMPH NODES: No lymphadenopathy. REPRODUCTIVE ORGANS: Status post hysterectomy. No adnexal mass. BONES AND SOFT TISSUES: Diffusely decreased bone density. Aged indeterminate, possibly acute, superior endplate and anterior wall fracture of the L5 vertebral body. Associated underlying at least 25% vertebral body height loss. No definite suspicious lytic or blastic osseous lesion. Moderate atherosclerotic plaque. No focal soft tissue abnormality. IMPRESSION: 1. Aged indeterminate, possibly acute, superior endplate and anterior wall fracture of the L5 vertebral body with at least 25% height loss. Orally with point tenderness to palpation to evaluate for an acute component. 2. Colonic diverticulosis with no acute diverticulitis. 3. Bilateral atrophic kidneys. 4. Status post hysterectomy. 5. Electronically signed by: Morgane Naveau MD 11/25/2024 02:30 PM EST RP Workstation: [id number]   DG Chest 1 View Result Date: 11/25/2024 CLINICAL DATA:  Constipation with nausea and generalized abdominal pain. EXAM: CHEST  1 VIEW COMPARISON:  July 20, 2023 FINDINGS: The cardiac silhouette is enlarged and unchanged in size. There is marked severity calcification of the aortic arch. Low lung volumes are noted without evidence of acute infiltrate, pleural effusion or pneumothorax. A 1.4 cm well-defined nodular opacity is seen overlying the soft tissues of the right breast. This may represent an overlying nipple shadow. Multilevel degenerative changes are seen throughout the thoracic spine. IMPRESSION: Low lung volumes without evidence of acute or active cardiopulmonary disease.  Electronically Signed   By: Suzen Dials M.D.   On: 11/25/2024 12:27   DG Abdomen 1 View Result Date: 11/25/2024 CLINICAL DATA:  Constipation. EXAM: ABDOMEN - 1 VIEW COMPARISON:  03/03/2022 FINDINGS: Diffuse gaseous distention of small bowel identified without overtly obstructive appearance. There is some gas scattered along colonic loops with stool visible in the rectum. Bones appear demineralized. IMPRESSION: Diffuse gaseous distention of small bowel without overtly obstructive appearance. Imaging features may be related to ileus. Colon is nondistended with some minimal stool visible in the rectum. Electronically Signed   By: Camellia Candle M.D.   On: 11/25/2024 12:24    Microbiology: Results for orders placed or performed during the hospital encounter of 12/17/24  Resp panel by RT-PCR (RSV, Flu A&B, Covid) Anterior Nasal Swab     Status: Abnormal   Collection Time: 12/17/24 11:40 AM   Specimen: Anterior Nasal Swab  Result Value Ref Range Status   SARS Coronavirus 2 by RT PCR NEGATIVE NEGATIVE Final    Comment: (NOTE) SARS-CoV-2 target nucleic acids are NOT DETECTED.  The SARS-CoV-2 RNA is generally detectable in upper respiratory specimens during the acute phase of infection. The lowest concentration of SARS-CoV-2 viral copies this assay can detect is 138 copies/mL. A negative result does not preclude SARS-Cov-2 infection and should not be used as the sole basis for treatment or other patient management decisions. A negative result may occur with  improper specimen collection/handling, submission of specimen other than nasopharyngeal swab, presence of viral mutation(s) within the  areas targeted by this assay, and inadequate number of viral copies(<138 copies/mL). A negative result must be combined with clinical observations, patient history, and epidemiological information. The expected result is Negative.  Fact Sheet for Patients:   bloggercourse.com  Fact Sheet for Healthcare Providers:  seriousbroker.it  This test is no t yet approved or cleared by the [place]  FDA and  has been authorized for detection and/or diagnosis of SARS-CoV-2 by FDA under an Emergency Use Authorization (EUA). This EUA will remain  in effect (meaning this test can be used) for the duration of the COVID-19 declaration under Section 564(b)(1) of the Act, 21 U.S.C.section 360bbb-3(b)(1), unless the authorization is terminated  or revoked sooner.       Influenza A by PCR POSITIVE (A) NEGATIVE Final   Influenza B by PCR NEGATIVE NEGATIVE Final    Comment: (NOTE) The Xpert Xpress SARS-CoV-2/FLU/RSV plus assay is intended as an aid in the diagnosis of influenza from Nasopharyngeal swab specimens and should not be used as a sole basis for treatment. Nasal washings and aspirates are unacceptable for Xpert Xpress SARS-CoV-2/FLU/RSV testing.  Fact Sheet for Patients: bloggercourse.com  Fact Sheet for Healthcare Providers: seriousbroker.it  This test is not yet approved or cleared by the [place]  FDA and has been authorized for detection and/or diagnosis of SARS-CoV-2 by FDA under an Emergency Use Authorization (EUA). This EUA will remain in effect (meaning this test can be used) for the duration of the COVID-19 declaration under Section 564(b)(1) of the Act, 21 U.S.C. section 360bbb-3(b)(1), unless the authorization is terminated or revoked.     Resp Syncytial Virus by PCR NEGATIVE NEGATIVE Final    Comment: (NOTE) Fact Sheet for Patients: bloggercourse.com  Fact Sheet for Healthcare Providers: seriousbroker.it  This test is not yet approved or cleared by the [place]  FDA and has been authorized for detection and/or diagnosis of SARS-CoV-2 by FDA under an Emergency Use  Authorization (EUA). This EUA will remain in effect (meaning this test can be used) for the duration of the COVID-19 declaration under Section 564(b)(1) of the Act, 21 U.S.C. section 360bbb-3(b)(1), unless the authorization is terminated or revoked.  Performed at [facility], [address] [place], [place] [address]   MRSA Next Gen by PCR, Nasal     Status: None   Collection Time: 12/18/24 12:49 AM   Specimen: Nasal Mucosa; Nasal Swab  Result Value Ref Range Status   MRSA by PCR Next Gen NOT DETECTED NOT DETECTED Final    Comment: (NOTE) The GeneXpert MRSA Assay (FDA approved for NASAL specimens only), is one component of a comprehensive MRSA colonization surveillance program. It is not intended to diagnose MRSA infection nor to guide or monitor treatment for MRSA infections. Test performance is not FDA approved in patients less than 86 years old. Performed at [facility], [address] [place], [place] [address]     Labs: CBC: Recent Labs  Lab 12/17/24 1140 12/17/24 2105 12/20/24 0934 12/21/24 1320 12/23/24 0900 12/23/24 2046 12/24/24 0455  WBC 6.1   < > 8.6 9.9 9.7 14.1* 12.3*  NEUTROABS 5.6  --   --   --  8.4*  --   --   HGB 10.3*   < > 10.8* 10.2* 9.5* 10.8* 10.0*  HCT 33.2*   < > 34.3* 31.8* 30.1* 34.1* 32.1*  MCV 103.4*   < > 100.0 99.4 100.7* 100.3* 101.3*  PLT 108*   < > 123* 122* 122* 155 132*   < > =  values in this interval not displayed.   Basic Metabolic Panel: Recent Labs  Lab 12/19/24 0852 12/20/24 0934 12/21/24 0500 12/23/24 0900 12/23/24 2046 12/24/24 0455  NA 136 137 138 136 142 138  K 4.0 4.3 3.8 3.9 3.3* 3.3*  CL 93* 93* 95* 96* 97* 99  CO2 27 28 23 27 29 28   GLUCOSE 150* 106* 193* 225* 112* 180*  BUN 35* 46* 60* 39* 18 21  CREATININE 5.31* 6.34* 7.41* 5.63* 3.45* 3.75*  CALCIUM  7.9* 7.7* 7.6* 8.0* 8.3* 8.2*  MG  --   --  2.4  --   --   --   PHOS 2.4* 2.8 2.5 2.1* 1.1*  --    Liver Function Tests: Recent  Labs  Lab 12/17/24 1140 12/19/24 0852 12/20/24 0934 12/21/24 0500 12/23/24 0900 12/23/24 2046  AST 33  --   --   --   --   --   ALT 13  --   --   --   --   --   ALKPHOS 181*  --   --   --   --   --   BILITOT 0.3  --   --   --   --   --   PROT 6.6  --   --   --   --   --   ALBUMIN  3.5 3.7 3.4* 3.3* 3.3* 3.7   CBG: Recent Labs  Lab 12/22/24 2118 12/23/24 1319 12/23/24 1656 12/23/24 2036 12/24/24 0806  GLUCAP 235* 115* 134* 106* 128*    Discharge time spent: 35 minutes.  Length of inpatient stay: 6 days  Signed: Carliss LELON Canales, DO [facility] 12/24/2024         "

## 2024-12-24 NOTE — Progress Notes (Signed)
 " [facility]  PROGRESS NOTE   Subjective:   Patient seen resting quietly in bed Alert and awake Remains on 2L [place] Cough present  Objective:  Vital signs: Blood pressure (!) 104/54, pulse 78, temperature 98.4 F (36.9 C), resp. rate 16, height 5' 4 (1.626 m), weight 94.5 kg, SpO2 98%.  Intake/Output Summary (Last 24 hours) at 12/24/2024 1232 Last data filed at 12/23/2024 1857 Gross per 24 hour  Intake 240 ml  Output --  Net 240 ml    Filed Weights   12/21/24 1256 12/21/24 1633 12/23/24 0829  Weight: 94.6 kg 93.6 kg 94.5 kg     Physical Exam: General:  No acute distress  Head:  Normocephalic, atraumatic. Moist oral mucosal membranes  Eyes:  Anicteric  Lungs:   congested, normal effort, NCO2, cough  Heart:  S1S2 no rubs, irregular  Abdomen:   Soft, nontender, bowel sounds present  Extremities: No peripheral edema.  Neurologic:  Awake, alert, following commands  Skin:  No lesions  Access: Left aVF    Basic Metabolic Panel: Recent Labs  Lab 12/19/24 0852 12/20/24 0934 12/21/24 0500 12/23/24 0900 12/23/24 2046 12/24/24 0455  NA 136 137 138 136 142 138  K 4.0 4.3 3.8 3.9 3.3* 3.3*  CL 93* 93* 95* 96* 97* 99  CO2 27 28 23 27 29 28   GLUCOSE 150* 106* 193* 225* 112* 180*  BUN 35* 46* 60* 39* 18 21  CREATININE 5.31* 6.34* 7.41* 5.63* 3.45* 3.75*  CALCIUM  7.9* 7.7* 7.6* 8.0* 8.3* 8.2*  MG  --   --  2.4  --   --   --   PHOS 2.4* 2.8 2.5 2.1* 1.1*  --    GFR: Estimated Creatinine Clearance: 13.1 mL/min (A) (by C-G formula based on SCr of 3.75 mg/dL (H)).  Liver Function Tests: Recent Labs  Lab 12/19/24 0852 12/20/24 0934 12/21/24 0500 12/23/24 0900 12/23/24 2046  ALBUMIN  3.7 3.4* 3.3* 3.3* 3.7   No results for input(s): LIPASE, AMYLASE in the last 168 hours. Recent Labs  Lab 12/19/24 1538  AMMONIA 29    CBC: Recent Labs  Lab 12/20/24 0934 12/21/24 1320 12/23/24 0900 12/23/24 2046 12/24/24 0455  WBC 8.6 9.9 9.7 14.1* 12.3*   NEUTROABS  --   --  8.4*  --   --   HGB 10.8* 10.2* 9.5* 10.8* 10.0*  HCT 34.3* 31.8* 30.1* 34.1* 32.1*  MCV 100.0 99.4 100.7* 100.3* 101.3*  PLT 123* 122* 122* 155 132*     HbA1C: Hemoglobin A1C  Date/Time Value Ref Range Status  06/12/2021 12:00 AM 6.6  Final   Hgb A1c MFr Bld  Date/Time Value Ref Range Status  06/21/2024 08:00 AM 6.0 (H) 4.8 - 5.6 % Final    Comment:    (NOTE) Diagnosis of Diabetes The following HbA1c ranges recommended by the American Diabetes Association (ADA) may be used as an aid in the diagnosis of diabetes mellitus.  Hemoglobin             Suggested A1C NGSP%              Diagnosis  <5.7                   Non Diabetic  5.7-6.4                Pre-Diabetic  >6.4                   Diabetic  <7.0  Glycemic control for                       adults with diabetes.    06/07/2023 07:44 PM 5.8 (H) 4.8 - 5.6 % Final    Comment:    (NOTE) Pre diabetes:          5.7%-6.4%  Diabetes:              >6.4%  Glycemic control for   <7.0% adults with diabetes     Urinalysis: No results for input(s): COLORURINE, LABSPEC, PHURINE, GLUCOSEU, HGBUR, BILIRUBINUR, KETONESUR, PROTEINUR, UROBILINOGEN, NITRITE, LEUKOCYTESUR in the last 72 hours.  Invalid input(s): APPERANCEUR    Imaging: DG ESOPHAGUS W SINGLE CM (SOL OR THIN BA) Result Date: 12/23/2024 CLINICAL DATA:  79 year old female with a history of GERD, on medication, currently admitted with acute on chronic hypoxic respiratory failure secondary to flu with possible superimposed bacterial pneumonia. CT AP from 12/17/2024 notable for mildly patulous lower esophagus with a few nodular densities. GI recommending barium swallow to loculation of prior to endoscopy. Patient reports difficulty swallowing both solids and liquids for the past several months. States that she frequently regurgitates liquids causing them to come out of her nose and that solids feel as if they  get stuck in her lower chest. EXAM: ESOPHAGUS/BARIUM SWALLOW/TABLET STUDY TECHNIQUE: Single contrast examination was performed using thin liquid barium. This exam was performed by [facility] PA-C, and was supervised and interpreted by Dr. KANDICE Moan. FLUOROSCOPY: Radiation Exposure Index (as provided by the fluoroscopic device): 71.5 mGy Kerma COMPARISON:  CT ABDOMEN PELVIS WO CONTRAST-12/17/2024 FINDINGS: Swallowing: Appears normal. No vestibular penetration or aspiration seen. Pharynx: Unremarkable. Esophagus: Limited evaluation due to single contrast exam, but no gross mucosal abnormality. Persistent narrowing at the level of the GE junction concerning for high grade stricture. Esophageal motility: Esophageal dysmotility with intermittent proximal escape and tertiary contractions leading to significantly delayed passage of contrast from the esophagus into the stomach Hiatal Hernia: None visualized on exam. Ingested 13mm barium tablet: Tablet became lodged in the mid to distal esophagus and would not pass with additional water thin barium intake. This did not occur at the level of the GE junction limiting evaluation of the stricture. Other: Extremely limited exam due to patient mobility. Entirety of exam was performed at approximately 15 degree table tilt. IMPRESSION: Significant esophageal dysmotility. Persistent narrowing at the level of the GE junction concerning for high grade stricture. Electronically Signed   By: Marcey Moan M.D.   On: 12/23/2024 16:38      Medications:    anticoagulant sodium citrate        allopurinol   100 mg Oral Daily   apixaban   2.5 mg Oral BID   bisacodyl   10 mg Rectal Once   calcium  acetate  667 mg Oral TID WC   droxidopa   200 mg Oral TID with meals   ezetimibe   10 mg Oral Daily   feeding supplement (NEPRO CARB STEADY)  237 mL Oral QHS   fludrocortisone   0.2 mg Oral BID   guaiFENesin   600 mg Oral BID   insulin  aspart  0-20 Units Subcutaneous TID WC    lactulose   20 g Oral TID   lidocaine   1 patch Transdermal Q24H   lidocaine   1 Application Topical BID   midodrine   10 mg Oral TID PC   multivitamin  1 tablet Oral QHS   pantoprazole   40 mg Oral BID   polyethylene glycol-electrolytes  2,000 mL  Oral Once   rosuvastatin   10 mg Oral Daily   senna  1 tablet Oral BID   umeclidinium-vilanterol  1 puff Inhalation Daily    Assessment/ Plan:     79 year old female with history of hypertension, coronary artery disease, diabetes, COPD, atrial fibrillation, end-stage renal disease on hemodialysis on Tuesday Thursday Saturday schedule.  She is now admitted with history of constipation and also respiratory illness.  She was found to have influenza A and is being treated for the same.  #1: ESRD on hemodialysis: Dialysis received yesterday, UF 1.3L. Next treatment scheduled for Saturday.  Patient cleared to discharge from renal stance and continue outpatient treatments.  Will defer discharge plan to primary team.  #2: Anemia with chronic kidney disease: Hemoglobin 10.0. No indication for ESA  #3: Secondary hyperparathyroidism: Calcium  within target, however phos decreased. Will hold binders  #4: COPD/pneumonia: PCR positive for influenza A.  Increased oxygen  requirement.  Completed prednisone  and Rocephin  with doxycycline .     LOS: 6 [facility] 1/23/202612:32 PM  "

## 2024-12-24 NOTE — TOC Transition Note (Signed)
 Transition of Care [facility]) - Discharge Note   Patient Details  Name: Jacqueline [name] MRN: [id number] Date of Birth: 14-Sep-1946  Transition of Care [facility]) CM/SW Contact:  Alfonso Rummer, LCSW Phone Number: 12/24/2024, 10:23 AM   Clinical Narrative:     KEN DELENA Rummer spk with pt son [name] Spiegel [phone] to advise pt will discharge back to [facility]. Pt son verbalized understanding and did not have any further questions or concerns.   Final next level of care: Skilled Nursing Facility Barriers to Discharge: Barriers Resolved   Patient Goals and CMS Choice            Discharge Placement              Patient chooses bed at: [facility] [place] (Pt a resident of [facility]) Patient to be transferred to facility by: [facility] Name of family member notified: [name] Spiegel [phone] Patient and family notified of of transfer: 12/24/24  Discharge Plan and Services Additional resources added to the After Visit Summary for                                       Social Drivers of Health (SDOH) Interventions SDOH Screenings   Food Insecurity: No Food Insecurity (12/18/2024)  Housing: Low Risk (12/18/2024)  Transportation Needs: No Transportation Needs (12/18/2024)  Utilities: Not At Risk (12/18/2024)  Social Connections: Moderately Isolated (12/18/2024)  Tobacco Use: Medium Risk (12/18/2024)     Readmission Risk Interventions    05/22/2022   12:26 PM  Readmission Risk Prevention Plan  Transportation Screening Complete  PCP or Specialist Appt within 3-5 Days Complete  HRI or Home Care Consult Complete  Social Work Consult for Recovery Care Planning/Counseling Complete  Palliative Care Screening Complete  Medication Review [employer]) Complete

## 2024-12-24 NOTE — Plan of Care (Signed)
  Problem: Education: Goal: Ability to describe self-care measures that may prevent or decrease complications (Diabetes Survival Skills Education) will improve Outcome: Adequate for Discharge Goal: Individualized Educational Video(s) Outcome: Adequate for Discharge   Problem: Coping: Goal: Ability to adjust to condition or change in health will improve Outcome: Adequate for Discharge   Problem: Fluid Volume: Goal: Ability to maintain a balanced intake and output will improve Outcome: Adequate for Discharge   Problem: Health Behavior/Discharge Planning: Goal: Ability to identify and utilize available resources and services will improve Outcome: Adequate for Discharge Goal: Ability to manage health-related needs will improve Outcome: Adequate for Discharge   Problem: Metabolic: Goal: Ability to maintain appropriate glucose levels will improve Outcome: Adequate for Discharge   Problem: Nutritional: Goal: Maintenance of adequate nutrition will improve Outcome: Adequate for Discharge Goal: Progress toward achieving an optimal weight will improve Outcome: Adequate for Discharge   Problem: Skin Integrity: Goal: Risk for impaired skin integrity will decrease Outcome: Adequate for Discharge   Problem: Tissue Perfusion: Goal: Adequacy of tissue perfusion will improve Outcome: Adequate for Discharge   Problem: Education: Goal: Knowledge of General Education information will improve Description: Including pain rating scale, medication(s)/side effects and non-pharmacologic comfort measures Outcome: Adequate for Discharge   Problem: Health Behavior/Discharge Planning: Goal: Ability to manage health-related needs will improve Outcome: Adequate for Discharge   Problem: Clinical Measurements: Goal: Ability to maintain clinical measurements within normal limits will improve Outcome: Adequate for Discharge Goal: Will remain free from infection Outcome: Adequate for Discharge Goal:  Diagnostic test results will improve Outcome: Adequate for Discharge Goal: Respiratory complications will improve Outcome: Adequate for Discharge Goal: Cardiovascular complication will be avoided Outcome: Adequate for Discharge   Problem: Activity: Goal: Risk for activity intolerance will decrease Outcome: Adequate for Discharge   Problem: Nutrition: Goal: Adequate nutrition will be maintained Outcome: Adequate for Discharge   Problem: Coping: Goal: Level of anxiety will decrease Outcome: Adequate for Discharge   Problem: Elimination: Goal: Will not experience complications related to bowel motility Outcome: Adequate for Discharge Goal: Will not experience complications related to urinary retention Outcome: Adequate for Discharge   Problem: Pain Managment: Goal: General experience of comfort will improve and/or be controlled Outcome: Adequate for Discharge   Problem: Safety: Goal: Ability to remain free from injury will improve Outcome: Adequate for Discharge   Problem: Skin Integrity: Goal: Risk for impaired skin integrity will decrease Outcome: Adequate for Discharge   Problem: Education: Goal: Knowledge of disease or condition will improve Outcome: Adequate for Discharge Goal: Knowledge of the prescribed therapeutic regimen will improve Outcome: Adequate for Discharge Goal: Individualized Educational Video(s) Outcome: Adequate for Discharge   Problem: Activity: Goal: Ability to tolerate increased activity will improve Outcome: Adequate for Discharge Goal: Will verbalize the importance of balancing activity with adequate rest periods Outcome: Adequate for Discharge   Problem: Respiratory: Goal: Ability to maintain a clear airway will improve Outcome: Adequate for Discharge Goal: Levels of oxygenation will improve Outcome: Adequate for Discharge Goal: Ability to maintain adequate ventilation will improve Outcome: Adequate for Discharge   Problem:  Activity: Goal: Ability to tolerate increased activity will improve Outcome: Adequate for Discharge   Problem: Clinical Measurements: Goal: Ability to maintain a body temperature in the normal range will improve Outcome: Adequate for Discharge   Problem: Respiratory: Goal: Ability to maintain adequate ventilation will improve Outcome: Adequate for Discharge Goal: Ability to maintain a clear airway will improve Outcome: Adequate for Discharge

## 2024-12-24 NOTE — Progress Notes (Signed)
 D/C order noted. Contacted [facility] to be advised of pt and d/c today. Requested documents faxed to clinic for continuation of care.  Suzen Satchel Dialysis Navigator [phone]

## 2024-12-24 NOTE — Progress Notes (Signed)
 Report called to Brittany at [facility]. Patient will be picked up by [facility] to return to the facility.

## 2024-12-25 ENCOUNTER — Emergency Department

## 2024-12-25 ENCOUNTER — Other Ambulatory Visit: Payer: Self-pay

## 2024-12-25 ENCOUNTER — Emergency Department
Admission: EM | Admit: 2024-12-25 | Discharge: 2024-12-25 | Disposition: A | Discharge status: Home / Self Care | Attending: Emergency Medicine | Admitting: Emergency Medicine

## 2024-12-25 DIAGNOSIS — N186 End stage renal disease: Secondary | ICD-10-CM | POA: Diagnosis not present

## 2024-12-25 DIAGNOSIS — I132 Hypertensive heart and chronic kidney disease with heart failure and with stage 5 chronic kidney disease, or end stage renal disease: Secondary | ICD-10-CM | POA: Insufficient documentation

## 2024-12-25 DIAGNOSIS — E1122 Type 2 diabetes mellitus with diabetic chronic kidney disease: Secondary | ICD-10-CM | POA: Insufficient documentation

## 2024-12-25 DIAGNOSIS — J449 Chronic obstructive pulmonary disease, unspecified: Secondary | ICD-10-CM | POA: Insufficient documentation

## 2024-12-25 DIAGNOSIS — I5033 Acute on chronic diastolic (congestive) heart failure: Secondary | ICD-10-CM | POA: Insufficient documentation

## 2024-12-25 DIAGNOSIS — S82841A Displaced bimalleolar fracture of right lower leg, initial encounter for closed fracture: Secondary | ICD-10-CM | POA: Insufficient documentation

## 2024-12-25 DIAGNOSIS — X501XXA Overexertion from prolonged static or awkward postures, initial encounter: Secondary | ICD-10-CM | POA: Diagnosis not present

## 2024-12-25 DIAGNOSIS — M79604 Pain in right leg: Secondary | ICD-10-CM | POA: Diagnosis present

## 2024-12-25 DIAGNOSIS — Z992 Dependence on renal dialysis: Secondary | ICD-10-CM | POA: Diagnosis not present

## 2024-12-25 MED ORDER — FENTANYL CITRATE (PF) 50 MCG/ML IJ SOSY
25.0000 ug | PREFILLED_SYRINGE | Freq: Once | INTRAMUSCULAR | Status: AC
  Administered 2024-12-25: 25 ug via INTRAVENOUS
  Filled 2024-12-25: qty 1

## 2024-12-25 MED ORDER — ACETAMINOPHEN 325 MG PO TABS
650.0000 mg | ORAL_TABLET | Freq: Once | ORAL | Status: AC
  Administered 2024-12-25: 650 mg via ORAL
  Filled 2024-12-25: qty 2

## 2024-12-25 MED ORDER — FENTANYL CITRATE (PF) 50 MCG/ML IJ SOSY
50.0000 ug | PREFILLED_SYRINGE | Freq: Once | INTRAMUSCULAR | Status: AC
  Administered 2024-12-25: 50 ug via INTRAVENOUS
  Filled 2024-12-25: qty 1

## 2024-12-25 NOTE — ED Provider Notes (Signed)
 "  [facility] Provider Note    Event Date/Time   First MD Initiated Contact with Patient 12/25/24 [phone]     (approximate)   History   Leg Pain   HPI  Jacqueline [name] is a 79 y.o. female who presents today for evaluation of right leg pain.  Patient reports that she was getting into her dialysis chair when she twisted her ankle and felt an immediate pain in her right ankle.  She has been unable to weight-bear since.  She reports that she is normally able to weight-bear, though does not walk very much.  She was recently discharged from the hospital yesterday for pneumonia.  She reports that she is feeling significantly improved from that standpoint.  She denies numbness or tingling in her extremities.  Patient Active Problem List   Diagnosis Date Noted   Pneumonia 12/17/2024   Chronic heart failure with preserved ejection fraction (HFpEF) (HCC) 12/17/2024   Anemia of chronic kidney failure 12/17/2024   Chronic hypoxic respiratory failure (HCC) 12/17/2024   GERD (gastroesophageal reflux disease) 12/17/2024   Influenza A 12/17/2024   Compression fracture of L5 vertebra (HCC) 12/17/2024   COPD with acute exacerbation (HCC) 10/08/2024   Acute bronchitis 06/22/2024   Obesity (BMI 30-39.9) 06/22/2024   Atrial fibrillation, chronic (HCC) 06/21/2024   Goals of care, counseling/discussion 06/12/2023   Severe pulmonary hypertension (HCC) 06/10/2023   Morbid obesity (HCC) 06/08/2023   Hyperkalemia 09/30/2022   Constipation 09/30/2022   AMS (altered mental status) 09/28/2022   Chronic hypotension    Permanent atrial fibrillation (HCC)    Pressure injury of skin 05/22/2022   General weakness 05/22/2022   Weakness 05/21/2022   Anaphylactic shock, unspecified, initial encounter 06/29/2020   Sepsis (HCC) 06/22/2019   Pain due to onychomycosis of toenails of both feet 06/11/2019   Callus 06/11/2019   Other disorders of phosphorus metabolism 05/20/2019   HLD  (hyperlipidemia) 03/22/2019   Otalgia of both ears 03/22/2019   Right arm pain 03/22/2019   Bradycardia 10/01/2017   Dyslipidemia 10/01/2017   Dependence on renal dialysis 09/01/2017   Chest pain, unspecified 12/06/2016   Non-ST elevation (NSTEMI) myocardial infarction (HCC) 12/06/2016   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   History of NSTEMI    CAD (coronary artery disease) 12/03/2016   Hypokalemia 05/03/2016   Pruritus, unspecified 03/27/2015   Encounter for removal of sutures 11/30/2014   Pain in lower limb 08/26/2014   Encounter for immunization 06/10/2014   Unspecified protein-calorie malnutrition 02/28/2014   Allergy, unspecified, initial encounter 02/15/2014   HCAP (healthcare-associated pneumonia) 02/13/2014   Absence of both cervix and uterus, acquired 02/01/2014   Coagulation defect, unspecified 02/01/2014   Dyspnea, unspecified 02/01/2014   Family history of diabetes mellitus 02/01/2014   Family history of ischemic heart disease and other diseases of the circulatory system 02/01/2014   Fever, unspecified 02/01/2014   Headache, unspecified 02/01/2014   History of allergy to sulfonamides 02/01/2014   Iron  deficiency anemia, unspecified 02/01/2014   Long term (current) use of insulin  (HCC) 02/01/2014   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   OSA (obstructive sleep apnea) 01/21/2014   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HCC) 01/21/2014   Acute encephalopathy 01/19/2014   Congestive heart failure (HCC) 01/15/2014   ESRD (end stage renal disease) (HCC) 01/15/2014   Hypertension 01/15/2014   Type 2 diabetes mellitus with end-stage renal disease (HCC) 01/15/2014   Anemia 01/15/2014   Onychomycosis 03/26/2013   Pain in joint, ankle and foot 03/26/2013  Physical Exam   Triage Vital Signs: ED Triage Vitals  Encounter Vitals Group     BP      Girls Systolic BP Percentile      Girls Diastolic BP Percentile      Boys Systolic BP Percentile       Boys Diastolic BP Percentile      Pulse      Resp      Temp      Temp src      SpO2      Weight      Height      Head Circumference      [facility] Flow      Pain Score      Pain Loc      Pain Education      Exclude from Growth Chart     Most recent vital signs: Vitals:   12/25/24 1415 12/25/24 1430  BP: (!) 109/46 (!) 115/52  Pulse: 80 74  Resp: 19 (!) 22  Temp:    SpO2: 94% 93%    Physical Exam Vitals and nursing note reviewed.  Constitutional:      General: Awake and alert. No acute distress.    Appearance: Normal appearance. The patient is normal weight.  HENT:     Head: Normocephalic and atraumatic.     Mouth: Mucous membranes are moist.  Eyes:     General: PERRL. Normal EOMs        Right eye: No discharge.        Left eye: No discharge.     Conjunctiva/sclera: Conjunctivae normal.  Cardiovascular:     Rate and Rhythm: Normal rate.     Pulses: Normal pulses.  Pulmonary:     Effort: Pulmonary effort is normal. No respiratory distress.     Breath sounds: Normal breath sounds.  On 2 L nasal cannula Abdominal:     Abdomen is soft. There is no abdominal tenderness. No rebound or guarding. No distention. Musculoskeletal:        General: No swelling. Normal range of motion.     Cervical back: Normal range of motion and neck supple.  Right ankle: Deformity noted to right ankle with ecchymosis along the medial aspect.  Normal pedal pulses palpated.  No tenderness to the proximal fibula.  No deformities noted to the foot.  Tenderness palpation diffusely to the ankle. Skin:    General: Skin is warm and dry.     Capillary Refill: Capillary refill takes less than 2 seconds.     Findings: No rash.  Neurological:     Mental Status: The patient is awake and alert.      ED Results / Procedures / Treatments   Labs (all labs ordered are listed, but only abnormal results are displayed) Labs Reviewed - No data to display   EKG     RADIOLOGY I independently reviewed  and interpreted imaging and agree with radiologists findings.     PROCEDURES:  Critical Care performed:   .Reduction of fracture  Date/Time: 12/25/2024 2:39 PM  Performed by: Candence Sease E, PA-C Authorized by: Jezebelle Ledwell E, PA-C  Consent: Verbal consent obtained. Written consent obtained Risks and benefits: risks, benefits and alternatives were discussed Consent given by: patient Patient understanding: patient states understanding of the procedure being performed Patient consent: the patient's understanding of the procedure matches consent given Procedure consent: procedure consent matches procedure scheduled Relevant documents: relevant documents present and verified Test results: test results available and properly labeled  Site marked: the operative site was marked Imaging studies: imaging studies available Required items: required blood products, implants, devices, and special equipment available Patient identity confirmed: verbally with patient Time out: Immediately prior to procedure a time out was called to verify the correct patient, procedure, equipment, support staff and site/side marked as required. Preparation: Patient was prepped and draped in the usual sterile fashion. Local anesthesia used: no  Anesthesia: Local anesthesia used: no  Sedation: Patient sedated: no  Patient tolerance: patient tolerated the procedure well with no immediate complications Comments: Postreduction films reveal significant improvement.  Patient remained neurovascularly intact both before and after procedure      MEDICATIONS ORDERED IN ED: Medications  fentaNYL  (SUBLIMAZE ) injection 50 mcg (50 mcg Intravenous Given 12/25/24 1311)  fentaNYL  (SUBLIMAZE ) injection 25 mcg (25 mcg Intravenous Given 12/25/24 1314)  acetaminophen  (TYLENOL ) tablet 650 mg (650 mg Oral Given 12/25/24 1440)     IMPRESSION / MDM / ASSESSMENT AND PLAN / ED COURSE  I reviewed the triage vital signs and the  nursing notes.   Differential diagnosis includes, but is not limited to, fracture, dislocation, sprain.  Patient is awake and alert, hemodynamically stable and afebrile.  She is chronically ill-appearing, though nontoxic in appearance.  She has normal pedal pulses, foot is warm and well-perfused.  X-rays obtained reveal displaced bimalleolar fracture with disruption of the ankle mortise.  I discussed with Dr. Malvin with podiatry who agrees with plan for reduction and splinting.  Patient is felt to be a poor sedation candidate, and she is willing to try fentanyl  for reduction.  Patient was consented, placed on capnography, with BVM and fluids at bedside.  Postreduction films obtained, showing significant improvement in alignment.  I consulted Dr. Malvin with podiatry again, he reports that this is significantly improved and patient is ready for [facility].  He will follow-up with the patient in 1 week.  I spoke with Garrel RN at [facility] who feels comfortable with excepting the patient back with instructions for her to remain nonweightbearing.  We discussed symptomatic management in the meantime, instructed turn precautions.  Patient and son are in agreement with this plan.   Patient's presentation is most consistent with acute presentation with potential threat to life or bodily function.  Clinical Course as of 12/25/24 1459  Sat Dec 25, 2024  1244 Discussed with Dr. Malvin, does not feel that she will have significant change after reduction given that it is not terribly displaced in the first place, though he still agrees with attempted reduction, splinting, and discharge back to [facility] with follow-up in 1 week [JP]  1349 Discussed again with Dr. Malvin who reviewed the postreduction films, feels that the alignment is significantly improved and that patient is safe to return to [facility].  I called [facility] and spoke with Garrel RN who says that  they are able to keep her nonweightbearing and are happy to except her back.  Patient and son are comfortable with this plan [JP]    Clinical Course User Index [JP] Plato Alspaugh E, PA-C     FINAL CLINICAL IMPRESSION(S) / ED DIAGNOSES   Final diagnoses:  Closed bimalleolar fracture of right ankle, initial encounter     Rx / DC Orders   ED Discharge Orders     None        Note:  This document was prepared using Dragon voice recognition software and may include unintentional dictation errors.   Merlean Pizzini E, PA-C  12/25/24 1459    Nicholaus Rolland BRAVO, MD 12/25/24 1649  "

## 2024-12-25 NOTE — ED Notes (Signed)
 R ankle swollen, R dorsalis pedal pulse heard on doppler, skin is warm and dry

## 2024-12-25 NOTE — ED Triage Notes (Signed)
 Pt arrives via [facility] from dialysis for R leg pain. Did receive full treatment at dialysis. Pt on 1L O2 baseline.

## 2024-12-25 NOTE — ED Provider Notes (Signed)
.  Splint Application  Date/Time: 12/25/2024 4:50 PM  Performed by: Nicholaus Rolland BRAVO, MD Authorized by: Nicholaus Rolland BRAVO, MD   Consent:    Consent obtained:  Written   Consent given by:  Patient   Risks discussed:  Discoloration   Alternatives discussed:  No treatment and delayed treatment Universal protocol:    Procedure explained and questions answered to patient or proxy's satisfaction: yes     Patient identity confirmed:  Verbally with patient and arm band Pre-procedure details:    Distal neurologic exam:  Normal   Distal perfusion: distal pulses strong   Procedure details:    Location:  Ankle   Ankle location:  R ankle   Splint type:  Ankle stirrup and short leg Post-procedure details:    Distal neurologic exam:  Normal   Distal perfusion: distal pulses strong     Procedure completion:  Tolerated well, no immediate complications     Nicholaus Rolland BRAVO, MD 12/25/24 1650

## 2024-12-25 NOTE — Discharge Instructions (Signed)
 Your ankle was fractured and displaced, and this was reduced for you in the emergency department and splinted.  Try to keep your foot elevated is much as possible.  It is very important that you remain nonweightbearing at all times.  Your splint needs to stay clean and dry.  Your case was discussed with the podiatrist, Dr. Malvin, who would like to see you in 1 week.  Please call to arrange this appointment.  Please return for any new, worsening, or changing symptoms or other concerns.  It was a pleasure caring for you today.

## 2024-12-28 ENCOUNTER — Other Ambulatory Visit: Payer: Self-pay | Admitting: Physician Assistant

## 2024-12-28 DIAGNOSIS — S32000S Wedge compression fracture of unspecified lumbar vertebra, sequela: Secondary | ICD-10-CM

## 2024-12-31 ENCOUNTER — Ambulatory Visit: Admitting: Podiatry

## 2025-01-04 ENCOUNTER — Telehealth: Payer: Self-pay

## 2025-01-04 NOTE — Telephone Encounter (Signed)
 Spoke with patient's son on the phone and stated that she now lives at [facility]. Will need to update patient's chart during her visit tomorrow.

## 2025-01-05 ENCOUNTER — Ambulatory Visit: Admitting: Physician Assistant

## 2025-01-05 ENCOUNTER — Other Ambulatory Visit

## 2025-01-07 ENCOUNTER — Emergency Department

## 2025-01-07 ENCOUNTER — Other Ambulatory Visit: Payer: Self-pay

## 2025-01-07 ENCOUNTER — Inpatient Hospital Stay (HOSPITAL_COMMUNITY)
Admission: EM | Admit: 2025-01-07 | Source: Other Acute Inpatient Hospital | Attending: Internal Medicine | Admitting: Internal Medicine

## 2025-01-07 ENCOUNTER — Encounter: Payer: Self-pay | Admitting: Emergency Medicine

## 2025-01-07 ENCOUNTER — Inpatient Hospital Stay
Admission: EM | Admit: 2025-01-07 | Discharge: 2025-01-07 | Disposition: A | Discharge status: Other Acute Inpatient Hospital | Source: Home / Self Care | Attending: Internal Medicine | Admitting: Internal Medicine

## 2025-01-07 ENCOUNTER — Encounter (HOSPITAL_COMMUNITY): Payer: Self-pay

## 2025-01-07 ENCOUNTER — Inpatient Hospital Stay (HOSPITAL_COMMUNITY): Admit: 2025-01-07 | Admitting: Internal Medicine

## 2025-01-07 DIAGNOSIS — I959 Hypotension, unspecified: Secondary | ICD-10-CM | POA: Diagnosis present

## 2025-01-07 DIAGNOSIS — S72422A Displaced fracture of lateral condyle of left femur, initial encounter for closed fracture: Principal | ICD-10-CM

## 2025-01-07 DIAGNOSIS — R579 Shock, unspecified: Principal | ICD-10-CM | POA: Diagnosis present

## 2025-01-07 DIAGNOSIS — S82841D Displaced bimalleolar fracture of right lower leg, subsequent encounter for closed fracture with routine healing: Secondary | ICD-10-CM

## 2025-01-07 DIAGNOSIS — D649 Anemia, unspecified: Secondary | ICD-10-CM

## 2025-01-07 LAB — CBC
HCT: 28.3 % — ABNORMAL LOW (ref 36.0–46.0)
Hemoglobin: 9 g/dL — ABNORMAL LOW (ref 12.0–15.0)
MCH: 32.7 pg (ref 26.0–34.0)
MCHC: 31.8 g/dL (ref 30.0–36.0)
MCV: 102.9 fL — ABNORMAL HIGH (ref 80.0–100.0)
Platelets: 234 10*3/uL (ref 150–400)
RBC: 2.75 MIL/uL — ABNORMAL LOW (ref 3.87–5.11)
RDW: 23.9 % — ABNORMAL HIGH (ref 11.5–15.5)
WBC: 12.7 10*3/uL — ABNORMAL HIGH (ref 4.0–10.5)
nRBC: 0.7 % — ABNORMAL HIGH (ref 0.0–0.2)

## 2025-01-07 LAB — PROTIME-INR
INR: 1.6 — ABNORMAL HIGH (ref 0.8–1.2)
Prothrombin Time: 20.1 s — ABNORMAL HIGH (ref 11.4–15.2)

## 2025-01-07 LAB — TYPE AND SCREEN
ABO/RH(D): O POS
ABO/RH(D): O POS
Antibody Screen: NEGATIVE
Antibody Screen: NEGATIVE
Unit division: 0

## 2025-01-07 LAB — COMPREHENSIVE METABOLIC PANEL WITH GFR
ALT: 16 U/L (ref 0–44)
AST: 25 U/L (ref 15–41)
Albumin: 3.2 g/dL — ABNORMAL LOW (ref 3.5–5.0)
Alkaline Phosphatase: 149 U/L — ABNORMAL HIGH (ref 38–126)
Anion gap: 12 (ref 5–15)
BUN: 36 mg/dL — ABNORMAL HIGH (ref 8–23)
CO2: 31 mmol/L (ref 22–32)
Calcium: 9 mg/dL (ref 8.9–10.3)
Chloride: 95 mmol/L — ABNORMAL LOW (ref 98–111)
Creatinine, Ser: 4.81 mg/dL — ABNORMAL HIGH (ref 0.44–1.00)
GFR, Estimated: 9 mL/min — ABNORMAL LOW
Glucose, Bld: 181 mg/dL — ABNORMAL HIGH (ref 70–99)
Potassium: 3.6 mmol/L (ref 3.5–5.1)
Sodium: 138 mmol/L (ref 135–145)
Total Bilirubin: 0.7 mg/dL (ref 0.0–1.2)
Total Protein: 6.4 g/dL — ABNORMAL LOW (ref 6.5–8.1)

## 2025-01-07 LAB — TROPONIN T, HIGH SENSITIVITY
Troponin T High Sensitivity: 190 ng/L (ref 0–19)
Troponin T High Sensitivity: 174 ng/L (ref 0–19)
Troponin T High Sensitivity: 175 ng/L (ref 0–19)

## 2025-01-07 LAB — BPAM RBC
Blood Product Expiration Date: 202603092359
ISSUE DATE / TIME: 202602061155
Unit Type and Rh: 5100

## 2025-01-07 LAB — APTT: aPTT: 58 s — ABNORMAL HIGH (ref 24–36)

## 2025-01-07 LAB — BASIC METABOLIC PANEL WITH GFR
Anion gap: 14 (ref 5–15)
BUN: 39 mg/dL — ABNORMAL HIGH (ref 8–23)
CO2: 28 mmol/L (ref 22–32)
Calcium: 8.8 mg/dL — ABNORMAL LOW (ref 8.9–10.3)
Chloride: 95 mmol/L — ABNORMAL LOW (ref 98–111)
Creatinine, Ser: 5.4 mg/dL — ABNORMAL HIGH (ref 0.44–1.00)
GFR, Estimated: 7 mL/min — ABNORMAL LOW
Glucose, Bld: 175 mg/dL — ABNORMAL HIGH (ref 70–99)
Potassium: 3.3 mmol/L — ABNORMAL LOW (ref 3.5–5.1)
Sodium: 137 mmol/L (ref 135–145)

## 2025-01-07 LAB — CBC WITH DIFFERENTIAL/PLATELET
Abs Immature Granulocytes: 0.05 10*3/uL (ref 0.00–0.07)
Basophils Absolute: 0.1 10*3/uL (ref 0.0–0.1)
Basophils Relative: 0 %
Eosinophils Absolute: 0.2 10*3/uL (ref 0.0–0.5)
Eosinophils Relative: 2 %
HCT: 22.8 % — ABNORMAL LOW (ref 36.0–46.0)
Hemoglobin: 6.9 g/dL — ABNORMAL LOW (ref 12.0–15.0)
Immature Granulocytes: 0 %
Lymphocytes Relative: 6 %
Lymphs Abs: 0.8 10*3/uL (ref 0.7–4.0)
MCH: 33 pg (ref 26.0–34.0)
MCHC: 30.3 g/dL (ref 30.0–36.0)
MCV: 109.1 fL — ABNORMAL HIGH (ref 80.0–100.0)
Monocytes Absolute: 1.3 10*3/uL — ABNORMAL HIGH (ref 0.1–1.0)
Monocytes Relative: 10 %
Neutro Abs: 10.4 10*3/uL — ABNORMAL HIGH (ref 1.7–7.7)
Neutrophils Relative %: 82 %
Platelets: 209 10*3/uL (ref 150–400)
RBC: 2.09 MIL/uL — ABNORMAL LOW (ref 3.87–5.11)
RDW: 23.1 % — ABNORMAL HIGH (ref 11.5–15.5)
Smear Review: NORMAL
WBC: 12.8 10*3/uL — ABNORMAL HIGH (ref 4.0–10.5)
nRBC: 0.5 % — ABNORMAL HIGH (ref 0.0–0.2)

## 2025-01-07 LAB — MRSA NEXT GEN BY PCR, NASAL: MRSA by PCR Next Gen: NOT DETECTED

## 2025-01-07 LAB — MAGNESIUM: Magnesium: 2.2 mg/dL (ref 1.7–2.4)

## 2025-01-07 LAB — LACTIC ACID, PLASMA: Lactic Acid, Venous: 0.8 mmol/L (ref 0.5–1.9)

## 2025-01-07 LAB — ABO/RH: ABO/RH(D): O POS

## 2025-01-07 LAB — CORTISOL: Cortisol, Plasma: 15.2 ug/dL

## 2025-01-07 LAB — PREPARE RBC (CROSSMATCH)

## 2025-01-07 LAB — GLUCOSE, CAPILLARY: Glucose-Capillary: 179 mg/dL — ABNORMAL HIGH (ref 70–99)

## 2025-01-07 MED ORDER — MORPHINE SULFATE (PF) 2 MG/ML IV SOLN
2.0000 mg | Freq: Once | INTRAVENOUS | Status: AC
  Administered 2025-01-07: 2 mg via INTRAVENOUS
  Filled 2025-01-07: qty 1

## 2025-01-07 MED ORDER — ROSUVASTATIN CALCIUM 5 MG PO TABS
10.0000 mg | ORAL_TABLET | Freq: Every day | ORAL | Status: AC
  Administered 2025-01-07: 10 mg via ORAL
  Filled 2025-01-07: qty 2

## 2025-01-07 MED ORDER — CHLORHEXIDINE GLUCONATE CLOTH 2 % EX PADS
6.0000 | MEDICATED_PAD | Freq: Every day | CUTANEOUS | Status: AC
  Administered 2025-01-07: 6 via TOPICAL

## 2025-01-07 MED ORDER — INSULIN ASPART 100 UNIT/ML IJ SOLN: 0.0000 [IU] | Freq: Three times a day (TID) | INTRAMUSCULAR | Status: DC

## 2025-01-07 MED ORDER — NOREPINEPHRINE 4 MG/250ML-% IV SOLN
0.0000 ug/min | INTRAVENOUS | Status: DC
  Administered 2025-01-07: 5 ug/min via INTRAVENOUS
  Filled 2025-01-07: qty 250

## 2025-01-07 MED ORDER — SODIUM CHLORIDE 0.9 % IV SOLN: 10.0000 mL/h | Freq: Once | INTRAVENOUS | Status: DC

## 2025-01-07 MED ORDER — INSULIN ASPART 100 UNIT/ML IJ SOLN
0.0000 [IU] | INTRAMUSCULAR | Status: AC
  Administered 2025-01-07: 1 [IU] via SUBCUTANEOUS
  Filled 2025-01-07: qty 1

## 2025-01-07 MED ORDER — MIDODRINE HCL 5 MG PO TABS
10.0000 mg | ORAL_TABLET | Freq: Three times a day (TID) | ORAL | Status: AC
  Administered 2025-01-07: 10 mg via ORAL
  Filled 2025-01-07: qty 2

## 2025-01-07 MED ORDER — INSULIN ASPART 100 UNIT/ML IJ SOLN: 0.0000 [IU] | Freq: Every day | INTRAMUSCULAR | Status: DC

## 2025-01-07 MED ORDER — DROXIDOPA 100 MG PO CAPS
200.0000 mg | ORAL_CAPSULE | Freq: Once | ORAL | Status: AC
  Administered 2025-01-07: 200 mg via ORAL
  Filled 2025-01-07: qty 2

## 2025-01-07 MED ORDER — DROXIDOPA 100 MG PO CAPS
200.0000 mg | ORAL_CAPSULE | Freq: Three times a day (TID) | ORAL | Status: AC
  Administered 2025-01-07: 200 mg via ORAL
  Filled 2025-01-07 (×2): qty 2

## 2025-01-07 MED ORDER — POLYETHYLENE GLYCOL 3350 17 G PO PACK: 17.0000 g | PACK | Freq: Every day | ORAL | Status: AC | PRN

## 2025-01-07 MED ORDER — ALBUMIN HUMAN 25 % IV SOLN
25.0000 g | Freq: Once | INTRAVENOUS | Status: AC
  Administered 2025-01-07: 25 g via INTRAVENOUS
  Filled 2025-01-07: qty 100

## 2025-01-07 MED ORDER — PANTOPRAZOLE SODIUM 40 MG IV SOLR
40.0000 mg | INTRAVENOUS | Status: AC
  Administered 2025-01-07: 40 mg via INTRAVENOUS
  Filled 2025-01-07: qty 10

## 2025-01-07 MED ORDER — SODIUM CHLORIDE 0.9 % IV SOLN: 250.0000 mL | INTRAVENOUS | Status: DC

## 2025-01-07 MED ORDER — POTASSIUM CHLORIDE CRYS ER 20 MEQ PO TBCR: 20.0000 meq | EXTENDED_RELEASE_TABLET | Freq: Once | ORAL | Status: AC

## 2025-01-07 MED ORDER — ACETAMINOPHEN 325 MG PO TABS
650.0000 mg | ORAL_TABLET | Freq: Four times a day (QID) | ORAL | Status: AC
  Administered 2025-01-07: 650 mg via ORAL
  Filled 2025-01-07: qty 2

## 2025-01-07 MED ORDER — IPRATROPIUM-ALBUTEROL 0.5-2.5 (3) MG/3ML IN SOLN: 3.0000 mL | RESPIRATORY_TRACT | Status: AC | PRN

## 2025-01-07 MED ORDER — SODIUM CHLORIDE 0.9 % IV BOLUS
500.0000 mL | Freq: Once | INTRAVENOUS | Status: AC
  Administered 2025-01-07: 500 mL via INTRAVENOUS

## 2025-01-07 MED ORDER — MIDODRINE HCL 5 MG PO TABS
20.0000 mg | ORAL_TABLET | Freq: Once | ORAL | Status: AC
  Administered 2025-01-07: 20 mg via ORAL
  Filled 2025-01-07: qty 4

## 2025-01-07 MED ORDER — EZETIMIBE 10 MG PO TABS
10.0000 mg | ORAL_TABLET | Freq: Every day | ORAL | Status: AC
  Administered 2025-01-07: 10 mg via ORAL
  Filled 2025-01-07: qty 1

## 2025-01-07 MED ORDER — CALCIUM ACETATE (PHOS BINDER) 667 MG PO CAPS
667.0000 mg | ORAL_CAPSULE | Freq: Three times a day (TID) | ORAL | Status: AC
  Filled 2025-01-07: qty 1

## 2025-01-07 MED ORDER — OXYCODONE HCL 5 MG PO TABS: 5.0000 mg | ORAL_TABLET | Freq: Four times a day (QID) | ORAL | Status: AC | PRN

## 2025-01-07 MED ORDER — CINACALCET HCL 30 MG PO TABS: 30.0000 mg | ORAL_TABLET | Freq: Every day | ORAL | Status: AC

## 2025-01-07 MED ORDER — PANTOPRAZOLE SODIUM 40 MG PO TBEC: 40.0000 mg | DELAYED_RELEASE_TABLET | Freq: Two times a day (BID) | ORAL | Status: DC

## 2025-01-07 MED ORDER — FLUDROCORTISONE ACETATE 0.1 MG PO TABS
0.2000 mg | ORAL_TABLET | Freq: Two times a day (BID) | ORAL | Status: AC
  Administered 2025-01-07: 0.2 mg via ORAL
  Filled 2025-01-07: qty 2

## 2025-01-07 MED ORDER — FENTANYL CITRATE (PF) 50 MCG/ML IJ SOSY: 25.0000 ug | PREFILLED_SYRINGE | INTRAMUSCULAR | Status: AC | PRN

## 2025-01-07 MED ORDER — SENNA 8.6 MG PO TABS: 1.0000 | ORAL_TABLET | Freq: Two times a day (BID) | ORAL | Status: AC | PRN

## 2025-01-07 NOTE — ED Notes (Signed)
 Critical result, troponin 190, relayed from lab. MD Floy made aware

## 2025-01-07 NOTE — Progress Notes (Signed)
 Troponin 174 called into [facility]

## 2025-01-07 NOTE — ED Notes (Signed)
 Pt accepted to [facility] per [facility], coordinator. Rolan Sharps is the accepting. Call report to [phone]. [facility] should be on the way soon for transport.

## 2025-01-07 NOTE — ED Notes (Signed)
 [facility] called speak with Nataya per Dr Willo

## 2025-01-07 NOTE — ED Notes (Signed)
 EMTALA reviewed by [employer], RN

## 2025-01-07 NOTE — Plan of Care (Signed)
 Patient arrived via stretcher, moved by staff to bed. Patient connected to monitor, BP being obtained Q15, and currently on 2.0 Liters. Patient remains on Levophed  5 mcg/hr. Patient given CHG bath. Patient's belongings including personnel night grown, pink wrist band, black socks, and key place in patient belonging bag.

## 2025-01-07 NOTE — Progress Notes (Signed)
 01/07/2025 Accepted for shock state and femur fx, low Hgb, chronically vasoplegic but now started on pressors.  Ortho trauma to see once here.  Rolan Sharps MD PCCM

## 2025-01-07 NOTE — ED Provider Notes (Signed)
 "  [facility] Provider Note    Event Date/Time   First MD Initiated Contact with Patient 01/07/25 [phone]     (approximate)   History   Chief Complaint Hip Pain (Patient to ER via EMS from [facility] after an alleged unwitnessed fall causing a fracture to her left hip per xrays taken by facility at unknown date; paperwork from facility shows radiology performed yesterday and has a +distal femur fracture)   HPI  Jacqueline [name] is a 79 y.o. female with past medical history of hypertension, diabetes, atrial fibrillation on Eliquis , HFpEF, COPD on 1 L, CAD, and ESRD on HD (TTS) who presents to the ED complaining of hip pain.  Patient reports that she was returning home from dialysis 1 to 2 weeks ago, when her leg was bent backwards while being transitioned back to her bed.  She had immediate onset of pain just above her knee and states she has been asking her facility to perform x-rays of the knee since then.  She states that x-rays were performed last night and positive for a fracture of her distal femur, causing her to be sent to the ED this morning.  She also had a fall about 2 weeks ago, found to have a bimalleolar fracture of her right ankle at that time when she was seen in the ED.  She states she has been wearing splint since then and was scheduled to be seen by podiatry this morning, but was not able to go to her appointment.  She currently complains of pain in her left hip and knee.     Physical Exam   Triage Vital Signs: ED Triage Vitals  Encounter Vitals Group     BP      Girls Systolic BP Percentile      Girls Diastolic BP Percentile      Boys Systolic BP Percentile      Boys Diastolic BP Percentile      Pulse      Resp      Temp      Temp src      SpO2      Weight      Height      Head Circumference      [facility] Flow      Pain Score      Pain Loc      Pain Education      Exclude from Growth Chart     Most recent vital signs: Vitals:    01/07/25 1515 01/07/25 1520  BP: 114/63 (!) 101/58  Pulse: (!) 108 (!) 105  Resp: (!) 22 (!) 21  Temp:    SpO2: 98% 95%    Constitutional: Alert and oriented. Eyes: Conjunctivae are normal. Head: Atraumatic. Nose: No congestion/rhinnorhea. Mouth/Throat: Mucous membranes are moist.  Cardiovascular: Normal rate, regular rhythm. Grossly normal heart sounds.  1+ radial and DP pulses bilaterally.  Left upper extremity AV fistula with palpable thrill. Respiratory: Normal respiratory effort.  No retractions. Lungs CTAB. Gastrointestinal: Soft and nontender. No distention. Musculoskeletal: Splint in place to right lower leg with range of motion intact to the toes, cap refill less than 2 seconds.  Diffuse tenderness to palpation of the left knee and left hip with shortening of the leg noted.  No tenderness to palpation noted of the left ankle. Neurologic:  Normal speech and language. No gross focal neurologic deficits are appreciated.    ED Results / Procedures / Treatments   Labs (all  labs ordered are listed, but only abnormal results are displayed) Labs Reviewed  CBC WITH DIFFERENTIAL/PLATELET - Abnormal; Notable for the following components:      Result Value   WBC 12.8 (*)    RBC 2.09 (*)    Hemoglobin 6.9 (*)    HCT 22.8 (*)    MCV 109.1 (*)    RDW 23.1 (*)    nRBC 0.5 (*)    Neutro Abs 10.4 (*)    Monocytes Absolute 1.3 (*)    All other components within normal limits  COMPREHENSIVE METABOLIC PANEL WITH GFR - Abnormal; Notable for the following components:   Chloride 95 (*)    Glucose, Bld 181 (*)    BUN 36 (*)    Creatinine, Ser 4.81 (*)    Total Protein 6.4 (*)    Albumin  3.2 (*)    Alkaline Phosphatase 149 (*)    GFR, Estimated 9 (*)    All other components within normal limits  PROTIME-INR - Abnormal; Notable for the following components:   Prothrombin Time 20.1 (*)    INR 1.6 (*)    All other components within normal limits  APTT - Abnormal; Notable for the  following components:   aPTT 58 (*)    All other components within normal limits  TYPE AND SCREEN  PREPARE RBC (CROSSMATCH)  ABO/RH  TROPONIN T, HIGH SENSITIVITY     EKG  ED ECG REPORT I, Carlin Palin, the attending physician, personally viewed and interpreted this ECG.   Date: 01/07/2025  EKG Time: 8:08  Rate: 75  Rhythm: atrial fibrillation  Axis: Normal  Intervals:right bundle branch block  ST&T Change: None  RADIOLOGY X-ray of left knee reviewed and interpreted by me with fracture of the distal femur.  PROCEDURES:  Critical Care performed: Yes, see critical care procedure note(s)  .Critical Care  Performed by: Palin Carlin, MD Authorized by: Palin Carlin, MD   Critical care provider statement:    Critical care time (minutes):  30   Critical care time was exclusive of:  Separately billable procedures and treating other patients and teaching time   Critical care was necessary to treat or prevent imminent or life-threatening deterioration of the following conditions:  Shock (Anemia)   Critical care was time spent personally by me on the following activities:  Development of treatment plan with patient or surrogate, discussions with consultants, evaluation of patient's response to treatment, examination of patient, ordering and review of laboratory studies, ordering and review of radiographic studies, ordering and performing treatments and interventions, pulse oximetry, re-evaluation of patient's condition and review of old charts   I assumed direction of critical care for this patient from another provider in my specialty: no      MEDICATIONS ORDERED IN ED: Medications  0.9 %  sodium chloride  infusion (0 mL/hr Intravenous Hold 01/07/25 1417)  0.9 %  sodium chloride  infusion (0 mLs Intravenous Hold 01/07/25 1418)  norepinephrine  (LEVOPHED ) 4mg  in (0.016 mg/mL) premix infusion (5 mcg/min Intravenous New Bag/Given 01/07/25 1417)  midodrine  (PROAMATINE ) tablet 20  mg (20 mg Oral Given 01/07/25 0847)  sodium chloride  0.9 % bolus 500 mL (0 mLs Intravenous Stopped 01/07/25 1101)  droxidopa  (NORTHERA ) capsule 200 mg (200 mg Oral Given 01/07/25 1203)  morphine  (PF) 2 MG/ML injection 2 mg (2 mg Intravenous Given 01/07/25 1518)     IMPRESSION / MDM / ASSESSMENT AND PLAN / ED COURSE  I reviewed the triage vital signs and the nursing notes.  79 y.o. female with past medical history of hypertension, diabetes, CAD, atrial fibrillation on Eliquis , HFpEF, COPD on 1 L, and ESRD on HD (TTS) who presents to the ED complaining of left knee and hip pain after an injury about 1 week ago while being transitioned back to her bed at nursing facility.  Patient's presentation is most consistent with acute presentation with potential threat to life or bodily function.  Differential diagnosis includes, but is not limited to, fracture, dislocation, neurovascular compromise.  Patient chronically ill-appearing but nontoxic and in no acute distress, vital signs remarkable for hypotension but otherwise reassuring.  Patient regularly takes midodrine  for blood pressure support, has been prescribed 20 mg to take as needed for systolic BP less than 100.  We will give 20 mg as well as 500 cc of IV fluids, patient did receive dialysis yesterday without issue.  She has tenderness and deformity of her left knee and hip, has diminished but palpable pulse in this extremity which I suspect is due to her hypotension.  Splint is intact to her right ankle with intact cap refill to this leg.  No evidence of traumatic injury to her head, neck, or upper extremities.  EKG shows atrial fibrillation without ischemic changes, lab results are pending at this time.  X-rays show fracture of the distal femur on left as well as stable bimalleolar fracture to the right ankle, CT imaging performed of the left hip which is negative for acute fracture or dislocation.  Findings reviewed with Dr.  Mariah of orthopedics, who recommends transfer to [facility] for orthopedic trauma evaluation.  He has spoken with Dr. Kendal at [facility], who is agreeable to transfer.  Unfortunately, patient continues to be hypotensive despite midodrine  and her other home medication for blood pressure support.  Labs do show hemoglobin of 6.9, which is a significant drop from a couple of weeks ago, but no evidence of GI bleed at this time given patient's rectal exam with guaiac negative stool.  It is possible that her drop in hemoglobin is due to hematoma associated with multiple orthopedic injuries.  No findings concerning for sepsis at this time and we will hold off on antibiotics, no symptoms to suggest ACS or cardiogenic shock.  BP now improved on Levophed , case discussed with ICU team here at [facility] for possible admission to stabilize prior to transfer to [facility], unfortunately no ICU beds available here at [facility] currently.  We will reach out to [facility] to see if patient can be transferred to the ICU there.  Patient turned over to oncoming provider pending discussion with [facility], ICU.      FINAL CLINICAL IMPRESSION(S) / ED DIAGNOSES   Final diagnoses:  Closed bicondylar fracture of left femur, initial encounter (HCC)  Closed bimalleolar fracture of right ankle with routine healing, subsequent encounter  Shock (HCC)  Anemia, unspecified type     Rx / DC Orders   ED Discharge Orders     None        Note:  This document was prepared using Dragon voice recognition software and may include unintentional dictation errors.   Willo Dunnings, MD 01/07/25 [phone]  "

## 2025-01-07 NOTE — Progress Notes (Signed)
 [facility] Physician-Brief Progress Note Patient Name: Jacqueline [name] DOB: 07-03-1946 MRN: [id number]   Date of Service  01/07/2025  HPI/Events of Note  59 female ESRD on HD, HFpEF presented with acute on chronic hypertension and left femur fracture.  Hb6.9>9 K3.3. Troponin 190>174>175.  Plateaued.  Likely type II.  No ischemic changes.  eICU Interventions  - Kcl 20meq ordered.  - low dose levophed  5 mcg - cont florinef  and midodrine .      Intervention Category Evaluation Type: New Patient Evaluation  Yunus Stoklosa 01/07/2025, 11:25 PM

## 2025-01-07 NOTE — Progress Notes (Signed)
 Patient belongings and key taken home by sons [name] Council and Darin Brower per patients request

## 2025-01-07 NOTE — H&P (Cosign Needed Addendum)
 "  NAME:  Jacqueline [name], MRN:  [id number], DOB:  1946/02/12, LOS: 0 ADMISSION DATE:  (Not on file), CONSULTATION DATE:  01/07/2025 REFERRING MD:  Dr. Willo, CHIEF COMPLAINT: shock, femur fracture    History of Present Illness:  Jacqueline [name] is a 79 year old female with history of permanent afib (on Eliquis ), ESRD on HD (TTS), T2DM, HFpEF, COPD (on chronic 1L nasal cannula), anemia of CKD, CAD s/p DES to RCA (12/2016), GERD, HLD, chronic hypotension and morbid obesity who presented to [facility] ED from [facility] with with complaint of left knee and hip pain. 1-2 weeks ago after returning from dialysis she reports her leg bent backwards when transferring to back to bed and she had immediate onset pain above knee. X-rays performed at the facility reportedly showed left femur fracture and she was sent to the ED.   In the [facility] ED she was found to be hypotensive with SBP 60-70's and MAP 50's and she was started on levophed . Hip XR showed comminuted, mildly displaced left femur fracture. And CT hip confirmed no acute left hip fracture. She was also found to be anemic with Hgb 6.9 and given 1 PRBC. She was transferred to [facility] for further management.   Pertinent  Medical History  Permanent afib (on Eliquis ), ESRD on HD (TTS), T2DM, HFpEF, COPD (on chronic 1L nasal cannula), anemia of CKD, CAD s/p DES to RCA (12/2016), GERD, HLD, chronic hypotension and morbid obesity  Significant Hospital Events: Including procedures, antibiotic start and stop dates in addition to other pertinent events   2/6: Admitted for acute on chronic hypotension, anemia and left femur fracture  Interim History / Subjective:  Per above  Objective    There were no vitals taken for this visit.        Intake/Output Summary (Last 24 hours) at 01/07/2025 1627 Last data filed at 01/07/2025 1415 Gross per 24 hour  Intake 940 ml  Output --  Net 940 ml   There were no vitals filed for this visit.  Examination: General:  ill  appearing female in nad HEENT: MM pink/dry; [place] in place Neuro: mild somnolent but arousable and Aox3 CV: s1s2, afib 100s, no m/r/g PULM:  dim clear BS bilaterally; [place] 3L GI: soft, bsx4 active  Extremities: warm/dry, RLE wrapped (previous fracture); pain in LLE    Resolved problem list   Assessment and Plan   Left femur fracture  Plan: - Ortho trauma consulted, appreciate recommendations - Multimodal pain control   Acute on chronic hypotension; suspect chronic shock state in the setting of ESRD and HFpEF exacerbated by acute blood loss. Lactic acid normal, no evidence of end organ dysfunction  Leukocytosis Plan: - Wean pressors as able to maintain SBP>90 or MAP>60 - Resume home florinef , midodrine  and droxidopa . There is room to increase both midodrine  and droxidopa   - bc pending. Given no fever and no source of infection will likely hold on abx for now  Permanent Afib, rate controlled  Plan: - tele monitoring - Holding home apixaban  for now given hgb 6.9 and pending ortho eval; possible heparin  pending repeat cbc - trend and replete electrolytes as needed  Hx of COPD Plan: -wean [place] for sats >92% -duoneb prn  Elevated troponin, likely due to demand ischemia. ECG with rate controlled Afib, no ischemic changes  History of CAD History of HLD Hx of hfpef Plan: - Trend troponin - Continue statin/zetia  -daily weights  ESRD -hd TTS; this week received it Wednesday and Thursday Plan: -  nephro consult in am for HD needs -Trend BMP  -Replace electrolytes as indicated  Acute on chronic anemia of CKD, cannot rule out blood loss in the setting of left femur fracture. Received 1 PRBC at [facility]. Plan: - Recheck CBC now and trend  - PPI - Correction of coagulopathy as indicated for active bleeding  T2DM Plan: - SSI, q4h CBG  Labs   CBC: Recent Labs  Lab 01/07/25 0840  WBC 12.8*  NEUTROABS 10.4*  HGB 6.9*  HCT 22.8*  MCV 109.1*  PLT 209    Basic Metabolic  Panel: Recent Labs  Lab 01/07/25 0840  NA 138  K 3.6  CL 95*  CO2 31  GLUCOSE 181*  BUN 36*  CREATININE 4.81*  CALCIUM  9.0   GFR: Estimated Creatinine Clearance: 10.3 mL/min (A) (by C-G formula based on SCr of 4.81 mg/dL (H)). Recent Labs  Lab 01/07/25 0840  WBC 12.8*    Liver Function Tests: Recent Labs  Lab 01/07/25 0840  AST 25  ALT 16  ALKPHOS 149*  BILITOT 0.7  PROT 6.4*  ALBUMIN  3.2*   No results for input(s): LIPASE, AMYLASE in the last 168 hours. No results for input(s): AMMONIA in the last 168 hours.  ABG    Component Value Date/Time   PHART 7.313 (L) 06/11/2023 1237   PCO2ART 48.7 (H) 06/11/2023 1237   PO2ART 90 06/11/2023 1237   HCO3 24.7 06/11/2023 1237   TCO2 26 06/11/2023 1237   ACIDBASEDEF 2.0 06/11/2023 1237   O2SAT 96 06/11/2023 1237     Coagulation Profile: Recent Labs  Lab 01/07/25 0933  INR 1.6*    Cardiac Enzymes: No results for input(s): CKTOTAL, CKMB, CKMBINDEX, TROPONINI in the last 168 hours.  HbA1C: Hemoglobin A1C  Date/Time Value Ref Range Status  06/12/2021 12:00 AM 6.6  Final   Hgb A1c MFr Bld  Date/Time Value Ref Range Status  06/21/2024 08:00 AM 6.0 (H) 4.8 - 5.6 % Final    Comment:    (NOTE) Diagnosis of Diabetes The following HbA1c ranges recommended by the American Diabetes Association (ADA) may be used as an aid in the diagnosis of diabetes mellitus.  Hemoglobin             Suggested A1C NGSP%              Diagnosis  <5.7                   Non Diabetic  5.7-6.4                Pre-Diabetic  >6.4                   Diabetic  <7.0                   Glycemic control for                       adults with diabetes.    06/07/2023 07:44 PM 5.8 (H) 4.8 - 5.6 % Final    Comment:    (NOTE) Pre diabetes:          5.7%-6.4%  Diabetes:              >6.4%  Glycemic control for   <7.0% adults with diabetes     CBG: No results for input(s): GLUCAP in the last 168 hours.    Past  Medical History:  She,  has a past medical history of  Arthritis, CAD (coronary artery disease), Chronic lower back pain, Diverticulosis, ESRD on dialysis (HCC), GERD (gastroesophageal reflux disease), Hiatal hernia, History of echocardiogram, Hyperlipidemia, Hypertension, Morbid obesity (HCC), Obesity, Persistent atrial fibrillation (HCC), Pneumonia, Symptomatic bradycardia (06/07/2023), and Type II diabetes mellitus (HCC).   Surgical History:   Past Surgical History:  Procedure Laterality Date   A/V FISTULAGRAM N/A 11/12/2023   Procedure: A/V Fistulagram;  Surgeon: Melia Lynwood ORN, MD;  Location: [facility] INVASIVE CV LAB;  Service: Cardiovascular;  Laterality: N/A;   A/V FISTULAGRAM Left 09/29/2024   Procedure: A/V Fistulagram;  Surgeon: Pearline Norman RAMAN, MD;  Location: [facility] PV LAB;  Service: Cardiovascular;  Laterality: Left;   ABDOMINAL HYSTERECTOMY     AV FISTULA PLACEMENT Left 01/27/2014   Procedure: ARTERIOVENOUS (AV) FISTULA CREATION;  Surgeon: Lynwood JONETTA Collum, MD;  Location: [facility] OR;  Service: Vascular;  Laterality: Left;   AV FISTULA REPAIR     had it cleaned out   CARDIAC CATHETERIZATION  ?1980s   CARDIAC CATHETERIZATION N/A 12/04/2016   Procedure: Left Heart Cath Angiography;  Surgeon: Lonni JONETTA Cash, MD;  Location: [facility] INVASIVE CV LAB;  Service: Cardiovascular;  Laterality: N/A;   CARDIAC CATHETERIZATION N/A 12/04/2016   Procedure: Coronary Stent Intervention;  Surgeon: Lonni JONETTA Cash, MD;  Location: [facility] INVASIVE CV LAB;  Service: Cardiovascular;  Laterality: N/A;  Mid RCA   CATARACT EXTRACTION W/ INTRAOCULAR LENS IMPLANT Left    COLONOSCOPY W/ BIOPSIES AND POLYPECTOMY     CORONARY ANGIOPLASTY WITH STENT PLACEMENT  12/04/2016   ESOPHAGOGASTRODUODENOSCOPY     INSERTION OF DIALYSIS CATHETER N/A 01/19/2014   Procedure: INSERTION OF DIALYSIS CATHETER;  Surgeon: Krystal JULIANNA Doing, MD;  Location: [facility] OR;  Service: Vascular;  Laterality: N/A;   PERIPHERAL VASCULAR BALLOON ANGIOPLASTY   11/12/2023   Procedure: PERIPHERAL VASCULAR BALLOON ANGIOPLASTY;  Surgeon: Melia Lynwood ORN, MD;  Location: [facility] INVASIVE CV LAB;  Service: Cardiovascular;;   RIGHT/LEFT HEART CATH AND CORONARY ANGIOGRAPHY N/A 06/11/2023   Procedure: RIGHT/LEFT HEART CATH AND CORONARY ANGIOGRAPHY;  Surgeon: Mady Lonni, MD;  Location: [facility] INVASIVE CV LAB;  Service: Cardiovascular;  Laterality: N/A;   VENOUS ANGIOPLASTY  09/29/2024   Procedure: VENOUS ANGIOPLASTY;  Surgeon: Pearline Norman RAMAN, MD;  Location: [facility] PV LAB;  Service: Cardiovascular;;  cephalic arch     Social History:   reports that she quit smoking about 41 years ago. Her smoking use included cigarettes. She started smoking about 61 years ago. She has a 20 pack-year smoking history. She has never used smokeless tobacco. She reports that she does not drink alcohol  and does not use drugs.   Family History:  Her family history includes Breast cancer in her sister; CAD in her father and sister; Cancer in her brother; Diabetes in her mother; Heart attack in her father; Heart disease in her father; Hypertension in her father; Peripheral vascular disease in her mother.   Allergies Allergies[1]   Home Medications  Prior to Admission medications  Medication Sig Start Date End Date Taking? Authorizing Provider  acetaminophen  (TYLENOL ) 650 MG CR tablet Take 650 mg by mouth every 8 (eight) hours. (0600, 1400 & 2200)    [provider]  albuterol  (PROVENTIL ) (2.5 MG/3ML) 0.083% nebulizer solution Take 2.5 mg by nebulization every 6 (six) hours as needed for wheezing or shortness of breath. Patient not taking: Reported on 01/07/2025    [provider]  allopurinol  (ZYLOPRIM ) 100 MG tablet Take 100 mg by mouth daily.    [provider]  aluminum -magnesium   hydroxide-simethicone  (MAALOX) 200-200-20 MG/5ML SUSP Take 30 mLs by mouth every 4 (four) hours as needed.    [provider]  apixaban  (ELIQUIS ) 2.5 MG TABS tablet Take 1  tablet (2.5 mg total) by mouth 2 (two) times daily. 06/25/24   Arlon Carliss ORN, DO  benzonatate  (TESSALON ) 100 MG capsule Take 100 mg by mouth 3 (three) times daily as needed for cough.    [provider]  bisacodyl  (DULCOLAX) 10 MG suppository Place 10 mg rectally daily as needed for moderate constipation.    [provider]  calcium  acetate (PHOSLO ) 667 MG capsule Take 1 capsule (667 mg total) by mouth 3 (three) times daily with meals. 12/24/24   Arlon Carliss ORN, DO  cinacalcet  (SENSIPAR ) 30 MG tablet Take 30 mg by mouth daily with supper. (1800)    [provider]  clotrimazole-betamethasone (LOTRISONE) cream Apply 1 Application topically See admin instructions. Apply as needed to affected area of ear for itching/irritation. Patient not taking: Reported on 01/07/2025    [provider]  Droxidopa  200 MG CAPS Take 200 mg by mouth with breakfast, with lunch, and with evening meal.    [provider]  ezetimibe  (ZETIA ) 10 MG tablet Take 1 tablet (10 mg total) by mouth daily. 04/30/22   Darron Deatrice LABOR, MD  ferric citrate  (AURYXIA ) 1 GM 210 MG(Fe) tablet Take 420 mg by mouth daily.    [provider]  fludrocortisone  (FLORINEF ) 0.1 MG tablet Take 2 tablets (0.2 mg total) by mouth 2 (two) times daily. 07/01/23   Fausto Burnard LABOR, DO  glucagon Emergency 1 MG SOLR 1 Milligram(s) IM Once PRN 12/24/24   [provider]  guaiFENesin -dextromethorphan  (ROBITUSSIN DM) 100-10 MG/5ML syrup Take 15 mLs by mouth every 4 (four) hours as needed for cough.    [provider]  HYDROcodone -acetaminophen  (NORCO/VICODIN) 5-325 MG tablet Take 1-2 tablets by mouth every 6 (six) hours as needed for moderate pain. Patient taking differently: Take 1 tablet by mouth every 8 (eight) hours as needed for moderate pain (pain score 4-6). 07/01/23   Fausto Burnard LABOR, DO  insulin  aspart (NOVOLOG ) 100 UNIT/ML injection Inject 0-20 Units into the skin 3 (three) times  daily with meals. For CBG 70-120: 0 units For CBG 121-150: 3 units For CBG 151-200: 4 units For CBG 201-250: 7 units For CBG 251-300: 11 units For CBG 301-350: 15 units For CBG 351-400: 20 units For CBG > 400: 25 units and call your doctor Patient taking differently: Inject 0-25 Units into the skin 3 (three) times daily with meals. For CBG 70-120: 0 units For CBG 121-150: 3 units For CBG 151-200: 4 units For CBG 201-250: 7 units For CBG 251-300: 11 units For CBG 301-350: 15 units For CBG 351-400: 20 units For CBG > 400: 25 units and call your doctor 07/01/23   Fausto Burnard A, DO  ipratropium-albuterol  (DUONEB) 0.5-2.5 (3) MG/3ML SOLN Take 3 mLs by nebulization 2 (two) times daily. Patient taking differently: Take 3 mLs by nebulization every 6 (six) hours as needed. 06/25/24   Arlon Carliss ORN, DO  lactulose  (CHRONULAC ) 10 GM/15ML solution Take 20 g by mouth 2 (two) times daily. 12/16/24   [provider]  lidocaine  (LIDODERM ) 5 % Place 1 patch onto the skin daily. 10/26/24   [provider]  Lidocaine  HCl (ASPERCREME LIDOCAINE ) 4 % CREA Apply 1 Application topically in the morning and at bedtime. Applied to bilateral knees and feet    [provider]  magnesium   hydroxide (MILK OF MAGNESIA) 400 MG/5ML suspension Take 30 mLs by mouth daily as needed (if no BM in 3 days.).    [provider]  Menthol -Zinc  Oxide (MOISTURE BARRIER EX) Apply 1 Application topically as directed. Apply barrier cream to buttocks every shift    [provider]  midodrine  (PROAMATINE ) 10 MG tablet Take 10 mg by mouth 3 (three) times daily.    [provider]  Multiple Vitamin (MULTIVITAMIN WITH MINERALS) TABS tablet Take 1 tablet by mouth at bedtime.    [provider]  naloxone  (NARCAN ) nasal spray 4 mg/0.1 mL Place 1 spray into the nose 3 (three) times daily as needed (signs and symptoms of opioid overdose).    [provider]  nitroGLYCERIN   (NITROSTAT ) 0.4 MG SL tablet Place 1 tablet (0.4 mg total) under the tongue every 5 (five) minutes as needed for chest pain. 07/01/23   Fausto Sor A, DO  ondansetron  (ZOFRAN -ODT) 4 MG disintegrating tablet Take 4 mg by mouth every 8 (eight) hours as needed for nausea or vomiting.    [provider]  OXYGEN  Inhale 2 L/min into the lungs as needed (shortness of breath or decreased O2).    [provider]  pantoprazole  (PROTONIX ) 40 MG tablet Take 1 tablet (40 mg total) by mouth 2 (two) times daily. 12/24/24   Arlon Carliss ORN, DO  polyethylene glycol (MIRALAX  / GLYCOLAX ) 17 g packet Take 17 g by mouth daily.    [provider]  rosuvastatin  (CRESTOR ) 10 MG tablet Take 1 tablet (10 mg total) by mouth daily. 10/03/22   Sreenath, Calvin NOVAK, MD  senna-docusate (SENOKOT-S) 8.6-50 MG tablet Take 2 tablets by mouth at bedtime.    [provider]  simethicone  (MYLICON) 80 MG chewable tablet Chew 80 mg by mouth daily as needed for flatulence.    [provider]  sodium phosphate (FLEET) ENEM Place 1 enema rectally daily as needed for severe constipation (if no BM results in 12 hours from dulcolax suppository).    [provider]  sorbitol  70 % solution Take 30 mLs by mouth daily as needed (constipation.).    [provider]     Critical care time: 40 minutes     JD Emilio DEVONNA [facility] 01/07/2025, 8:09 PM  Please see [email or web address] for pager details.  From 7A-7P if no response, please call [phone]. After hours, please call ELink [phone].          [1]  Allergies Allergen Reactions   Sulfa Antibiotics Other (See Comments)   Sulfa Drugs Cross Reactors Other (See Comments)    Doesn't remember    "

## 2025-01-21 ENCOUNTER — Ambulatory Visit: Admitting: Podiatry

## 2025-02-09 ENCOUNTER — Other Ambulatory Visit

## 2025-02-09 ENCOUNTER — Ambulatory Visit: Admitting: Physician Assistant
